# Patient Record
Sex: Male | Born: 1965 | Race: White | Hispanic: No | Marital: Single | Smoking: Current every day smoker
Health system: Southern US, Community
[De-identification: ages and names within clinical notes are randomized; demographics above are authoritative.]

## PROBLEM LIST (undated history)

## (undated) DIAGNOSIS — K469 Unspecified abdominal hernia without obstruction or gangrene: Secondary | ICD-10-CM

## (undated) DIAGNOSIS — K5000 Crohn's disease of small intestine without complications: Secondary | ICD-10-CM

## (undated) DIAGNOSIS — H5500 Unspecified nystagmus: Secondary | ICD-10-CM

## (undated) DIAGNOSIS — G4700 Insomnia, unspecified: Secondary | ICD-10-CM

## (undated) DIAGNOSIS — Z933 Colostomy status: Secondary | ICD-10-CM

## (undated) DIAGNOSIS — F319 Bipolar disorder, unspecified: Secondary | ICD-10-CM

## (undated) DIAGNOSIS — Z8672 Personal history of thrombophlebitis: Secondary | ICD-10-CM

## (undated) DIAGNOSIS — F23 Brief psychotic disorder: Secondary | ICD-10-CM

## (undated) DIAGNOSIS — Z86718 Personal history of other venous thrombosis and embolism: Secondary | ICD-10-CM

## (undated) DIAGNOSIS — K5010 Crohn's disease of large intestine without complications: Secondary | ICD-10-CM

## (undated) DIAGNOSIS — E785 Hyperlipidemia, unspecified: Secondary | ICD-10-CM

## (undated) DIAGNOSIS — F209 Schizophrenia, unspecified: Secondary | ICD-10-CM

## (undated) DIAGNOSIS — F32A Depression, unspecified: Secondary | ICD-10-CM

## (undated) DIAGNOSIS — I82409 Acute embolism and thrombosis of unspecified deep veins of unspecified lower extremity: Secondary | ICD-10-CM

## (undated) DIAGNOSIS — G43909 Migraine, unspecified, not intractable, without status migrainosus: Secondary | ICD-10-CM

## (undated) DIAGNOSIS — M1990 Unspecified osteoarthritis, unspecified site: Secondary | ICD-10-CM

## (undated) DIAGNOSIS — F329 Major depressive disorder, single episode, unspecified: Secondary | ICD-10-CM

## (undated) DIAGNOSIS — N390 Urinary tract infection, site not specified: Secondary | ICD-10-CM

## (undated) DIAGNOSIS — Z9889 Other specified postprocedural states: Secondary | ICD-10-CM

## (undated) DIAGNOSIS — M87059 Idiopathic aseptic necrosis of unspecified femur: Secondary | ICD-10-CM

## (undated) DIAGNOSIS — K219 Gastro-esophageal reflux disease without esophagitis: Secondary | ICD-10-CM

## (undated) DIAGNOSIS — B952 Enterococcus as the cause of diseases classified elsewhere: Secondary | ICD-10-CM

## (undated) HISTORY — DX: Idiopathic aseptic necrosis of unspecified femur: M87.059

## (undated) HISTORY — PX: APPENDECTOMY: SHX54

## (undated) HISTORY — DX: Personal history of other venous thrombosis and embolism: Z86.718

## (undated) HISTORY — DX: Gastro-esophageal reflux disease without esophagitis: K21.9

## (undated) HISTORY — DX: Urinary tract infection, site not specified: N39.0

## (undated) HISTORY — DX: Acute embolism and thrombosis of unspecified deep veins of unspecified lower extremity: I82.409

## (undated) HISTORY — DX: Crohn's disease of small intestine without complications: K50.00

## (undated) HISTORY — DX: Unspecified nystagmus: H55.00

## (undated) HISTORY — DX: Insomnia, unspecified: G47.00

## (undated) HISTORY — DX: Other specified postprocedural states: Z98.890

## (undated) HISTORY — PX: KIDNEY SURGERY: SHX687

## (undated) HISTORY — DX: Personal history of thrombophlebitis: Z86.72

## (undated) HISTORY — DX: Crohn's disease of large intestine without complications: K50.10

## (undated) HISTORY — PX: TOTAL HIP ARTHROPLASTY: SHX124

## (undated) HISTORY — DX: Hyperlipidemia, unspecified: E78.5

## (undated) HISTORY — DX: Enterococcus as the cause of diseases classified elsewhere: B95.2

## (undated) HISTORY — PX: EYE SURGERY: SHX253

## (undated) HISTORY — DX: Bipolar disorder, unspecified: F31.9

## (undated) HISTORY — PX: TOTAL SHOULDER REPLACEMENT: SUR1217

---

## 2002-05-14 ENCOUNTER — Ambulatory Visit (HOSPITAL_COMMUNITY): Admission: RE | Admit: 2002-05-14 | Discharge: 2002-05-14 | Payer: Self-pay | Admitting: Orthopedic Surgery

## 2002-05-14 ENCOUNTER — Encounter: Payer: Self-pay | Admitting: Orthopedic Surgery

## 2004-01-31 ENCOUNTER — Ambulatory Visit (HOSPITAL_COMMUNITY): Admission: RE | Admit: 2004-01-31 | Discharge: 2004-01-31 | Payer: Self-pay | Admitting: Internal Medicine

## 2004-02-03 ENCOUNTER — Ambulatory Visit (HOSPITAL_COMMUNITY): Admission: RE | Admit: 2004-02-03 | Discharge: 2004-02-03 | Payer: Self-pay | Admitting: Internal Medicine

## 2004-03-06 ENCOUNTER — Ambulatory Visit (HOSPITAL_COMMUNITY): Admission: RE | Admit: 2004-03-06 | Discharge: 2004-03-06 | Payer: Self-pay | Admitting: Internal Medicine

## 2004-03-09 ENCOUNTER — Inpatient Hospital Stay (HOSPITAL_COMMUNITY): Admission: AD | Admit: 2004-03-09 | Discharge: 2004-03-12 | Payer: Self-pay | Admitting: Family Medicine

## 2004-07-23 ENCOUNTER — Ambulatory Visit (HOSPITAL_COMMUNITY): Admission: RE | Admit: 2004-07-23 | Discharge: 2004-07-23 | Payer: Self-pay | Admitting: General Surgery

## 2004-09-14 ENCOUNTER — Ambulatory Visit: Payer: Self-pay | Admitting: Internal Medicine

## 2004-09-23 ENCOUNTER — Ambulatory Visit (HOSPITAL_COMMUNITY): Admission: RE | Admit: 2004-09-23 | Discharge: 2004-09-23 | Payer: Self-pay | Admitting: Internal Medicine

## 2004-10-16 ENCOUNTER — Ambulatory Visit: Payer: Self-pay | Admitting: Internal Medicine

## 2004-11-06 ENCOUNTER — Ambulatory Visit: Payer: Self-pay | Admitting: Internal Medicine

## 2004-12-30 ENCOUNTER — Ambulatory Visit: Payer: Self-pay | Admitting: Internal Medicine

## 2005-04-01 ENCOUNTER — Ambulatory Visit: Payer: Self-pay | Admitting: Internal Medicine

## 2005-04-13 ENCOUNTER — Ambulatory Visit (HOSPITAL_COMMUNITY): Admission: RE | Admit: 2005-04-13 | Discharge: 2005-04-13 | Payer: Self-pay | Admitting: Internal Medicine

## 2005-06-29 ENCOUNTER — Ambulatory Visit: Payer: Self-pay | Admitting: Internal Medicine

## 2005-12-30 ENCOUNTER — Ambulatory Visit: Payer: Self-pay | Admitting: Internal Medicine

## 2006-04-26 ENCOUNTER — Ambulatory Visit: Payer: Self-pay | Admitting: Internal Medicine

## 2006-05-26 ENCOUNTER — Ambulatory Visit: Payer: Self-pay | Admitting: Internal Medicine

## 2006-07-26 ENCOUNTER — Ambulatory Visit: Payer: Self-pay | Admitting: Internal Medicine

## 2006-08-17 ENCOUNTER — Encounter: Payer: Self-pay | Admitting: Orthopedic Surgery

## 2006-09-03 ENCOUNTER — Encounter: Payer: Self-pay | Admitting: Orthopedic Surgery

## 2006-09-16 ENCOUNTER — Ambulatory Visit: Payer: Self-pay | Admitting: Internal Medicine

## 2006-10-26 ENCOUNTER — Ambulatory Visit: Payer: Self-pay | Admitting: Internal Medicine

## 2006-11-23 ENCOUNTER — Ambulatory Visit: Payer: Self-pay | Admitting: Internal Medicine

## 2007-01-17 ENCOUNTER — Ambulatory Visit: Payer: Self-pay | Admitting: Internal Medicine

## 2007-07-04 ENCOUNTER — Ambulatory Visit: Payer: Self-pay | Admitting: Gastroenterology

## 2007-07-04 ENCOUNTER — Encounter (HOSPITAL_COMMUNITY): Admission: RE | Admit: 2007-07-04 | Discharge: 2007-07-04 | Payer: Self-pay | Admitting: Oncology

## 2007-07-05 ENCOUNTER — Encounter (HOSPITAL_COMMUNITY): Admission: RE | Admit: 2007-07-05 | Discharge: 2007-08-04 | Payer: Self-pay | Admitting: Gastroenterology

## 2007-07-06 ENCOUNTER — Ambulatory Visit (HOSPITAL_COMMUNITY): Payer: Self-pay | Admitting: Gastroenterology

## 2007-07-10 ENCOUNTER — Ambulatory Visit: Payer: Self-pay | Admitting: Gastroenterology

## 2007-07-10 ENCOUNTER — Ambulatory Visit (HOSPITAL_COMMUNITY): Admission: RE | Admit: 2007-07-10 | Discharge: 2007-07-10 | Payer: Self-pay | Admitting: Gastroenterology

## 2007-07-10 ENCOUNTER — Encounter: Payer: Self-pay | Admitting: Gastroenterology

## 2007-07-25 ENCOUNTER — Ambulatory Visit: Payer: Self-pay | Admitting: Gastroenterology

## 2007-08-29 ENCOUNTER — Ambulatory Visit: Payer: Self-pay | Admitting: Gastroenterology

## 2007-09-14 ENCOUNTER — Inpatient Hospital Stay (HOSPITAL_COMMUNITY): Admission: EM | Admit: 2007-09-14 | Discharge: 2007-09-18 | Payer: Self-pay | Admitting: Emergency Medicine

## 2007-09-15 ENCOUNTER — Ambulatory Visit: Payer: Self-pay | Admitting: Internal Medicine

## 2007-09-16 ENCOUNTER — Ambulatory Visit: Payer: Self-pay | Admitting: Gastroenterology

## 2007-09-18 ENCOUNTER — Ambulatory Visit: Payer: Self-pay | Admitting: Internal Medicine

## 2007-10-05 DIAGNOSIS — I82409 Acute embolism and thrombosis of unspecified deep veins of unspecified lower extremity: Secondary | ICD-10-CM

## 2007-10-05 DIAGNOSIS — N390 Urinary tract infection, site not specified: Secondary | ICD-10-CM

## 2007-10-05 DIAGNOSIS — B952 Enterococcus as the cause of diseases classified elsewhere: Secondary | ICD-10-CM

## 2007-10-05 HISTORY — DX: Urinary tract infection, site not specified: N39.0

## 2007-10-05 HISTORY — PX: TOTAL COLECTOMY: SHX852

## 2007-10-05 HISTORY — DX: Enterococcus as the cause of diseases classified elsewhere: B95.2

## 2007-10-05 HISTORY — DX: Acute embolism and thrombosis of unspecified deep veins of unspecified lower extremity: I82.409

## 2007-10-10 ENCOUNTER — Ambulatory Visit: Payer: Self-pay | Admitting: Gastroenterology

## 2007-11-14 ENCOUNTER — Ambulatory Visit: Payer: Self-pay | Admitting: Gastroenterology

## 2007-11-15 ENCOUNTER — Encounter (HOSPITAL_COMMUNITY): Admission: RE | Admit: 2007-11-15 | Discharge: 2007-12-15 | Payer: Self-pay | Admitting: Gastroenterology

## 2007-11-15 ENCOUNTER — Ambulatory Visit (HOSPITAL_COMMUNITY): Payer: Self-pay | Admitting: Gastroenterology

## 2007-11-20 ENCOUNTER — Ambulatory Visit (HOSPITAL_COMMUNITY): Payer: Self-pay | Admitting: Oncology

## 2007-11-24 ENCOUNTER — Inpatient Hospital Stay (HOSPITAL_COMMUNITY): Admission: EM | Admit: 2007-11-24 | Discharge: 2007-11-28 | Payer: Self-pay | Admitting: Emergency Medicine

## 2007-11-24 ENCOUNTER — Ambulatory Visit: Payer: Self-pay | Admitting: Gastroenterology

## 2007-11-27 ENCOUNTER — Ambulatory Visit: Payer: Self-pay | Admitting: Internal Medicine

## 2007-12-18 ENCOUNTER — Encounter (HOSPITAL_COMMUNITY): Admission: RE | Admit: 2007-12-18 | Discharge: 2008-01-17 | Payer: Self-pay | Admitting: Gastroenterology

## 2007-12-26 ENCOUNTER — Ambulatory Visit: Payer: Self-pay | Admitting: Gastroenterology

## 2008-01-11 ENCOUNTER — Ambulatory Visit: Payer: Self-pay | Admitting: Gastroenterology

## 2008-01-22 ENCOUNTER — Inpatient Hospital Stay (HOSPITAL_COMMUNITY): Admission: EM | Admit: 2008-01-22 | Discharge: 2008-01-25 | Payer: Self-pay | Admitting: Emergency Medicine

## 2008-01-22 ENCOUNTER — Ambulatory Visit: Payer: Self-pay | Admitting: Gastroenterology

## 2008-01-23 ENCOUNTER — Ambulatory Visit: Payer: Self-pay | Admitting: Internal Medicine

## 2008-01-24 ENCOUNTER — Ambulatory Visit: Payer: Self-pay | Admitting: Gastroenterology

## 2008-01-25 ENCOUNTER — Ambulatory Visit: Payer: Self-pay | Admitting: Gastroenterology

## 2008-01-31 ENCOUNTER — Inpatient Hospital Stay (HOSPITAL_COMMUNITY): Admission: EM | Admit: 2008-01-31 | Discharge: 2008-02-11 | Payer: Self-pay | Admitting: Emergency Medicine

## 2008-02-01 ENCOUNTER — Ambulatory Visit: Payer: Self-pay | Admitting: Internal Medicine

## 2008-02-02 ENCOUNTER — Ambulatory Visit: Payer: Self-pay | Admitting: Oncology

## 2008-02-02 ENCOUNTER — Ambulatory Visit: Payer: Self-pay | Admitting: Internal Medicine

## 2008-02-06 ENCOUNTER — Ambulatory Visit: Payer: Self-pay | Admitting: Gastroenterology

## 2008-02-19 ENCOUNTER — Ambulatory Visit: Payer: Self-pay | Admitting: Gastroenterology

## 2008-03-04 ENCOUNTER — Inpatient Hospital Stay (HOSPITAL_COMMUNITY): Admission: EM | Admit: 2008-03-04 | Discharge: 2008-03-07 | Payer: Self-pay | Admitting: Emergency Medicine

## 2008-03-16 ENCOUNTER — Emergency Department (HOSPITAL_COMMUNITY): Admission: EM | Admit: 2008-03-16 | Discharge: 2008-03-17 | Payer: Self-pay | Admitting: Emergency Medicine

## 2008-03-17 ENCOUNTER — Inpatient Hospital Stay (HOSPITAL_COMMUNITY): Admission: EM | Admit: 2008-03-17 | Discharge: 2008-03-17 | Payer: Self-pay | Admitting: Emergency Medicine

## 2008-03-17 ENCOUNTER — Ambulatory Visit: Payer: Self-pay | Admitting: Gastroenterology

## 2008-04-05 ENCOUNTER — Inpatient Hospital Stay (HOSPITAL_COMMUNITY): Admission: EM | Admit: 2008-04-05 | Discharge: 2008-04-07 | Payer: Self-pay | Admitting: Emergency Medicine

## 2008-04-06 ENCOUNTER — Ambulatory Visit: Payer: Self-pay | Admitting: Gastroenterology

## 2008-05-14 ENCOUNTER — Emergency Department (HOSPITAL_COMMUNITY): Admission: EM | Admit: 2008-05-14 | Discharge: 2008-05-14 | Payer: Self-pay | Admitting: Emergency Medicine

## 2008-06-13 ENCOUNTER — Ambulatory Visit: Payer: Self-pay | Admitting: Gastroenterology

## 2008-09-24 ENCOUNTER — Encounter: Payer: Self-pay | Admitting: Orthopedic Surgery

## 2008-10-04 ENCOUNTER — Encounter: Payer: Self-pay | Admitting: Orthopedic Surgery

## 2008-11-04 ENCOUNTER — Encounter: Payer: Self-pay | Admitting: Orthopedic Surgery

## 2008-11-07 ENCOUNTER — Ambulatory Visit: Payer: Self-pay | Admitting: Gastroenterology

## 2008-11-19 ENCOUNTER — Ambulatory Visit (HOSPITAL_COMMUNITY): Admission: RE | Admit: 2008-11-19 | Discharge: 2008-11-19 | Payer: Self-pay | Admitting: Gastroenterology

## 2009-01-07 ENCOUNTER — Encounter (INDEPENDENT_AMBULATORY_CARE_PROVIDER_SITE_OTHER): Payer: Self-pay

## 2009-01-07 ENCOUNTER — Telehealth: Payer: Self-pay | Admitting: Gastroenterology

## 2009-02-11 DIAGNOSIS — J45909 Unspecified asthma, uncomplicated: Secondary | ICD-10-CM | POA: Insufficient documentation

## 2009-02-11 DIAGNOSIS — Z8672 Personal history of thrombophlebitis: Secondary | ICD-10-CM

## 2009-02-11 DIAGNOSIS — I951 Orthostatic hypotension: Secondary | ICD-10-CM | POA: Insufficient documentation

## 2009-02-11 DIAGNOSIS — K219 Gastro-esophageal reflux disease without esophagitis: Secondary | ICD-10-CM

## 2009-02-11 DIAGNOSIS — G4700 Insomnia, unspecified: Secondary | ICD-10-CM

## 2009-02-11 DIAGNOSIS — E785 Hyperlipidemia, unspecified: Secondary | ICD-10-CM | POA: Insufficient documentation

## 2009-02-11 DIAGNOSIS — M129 Arthropathy, unspecified: Secondary | ICD-10-CM | POA: Insufficient documentation

## 2009-02-11 DIAGNOSIS — M8700 Idiopathic aseptic necrosis of unspecified bone: Secondary | ICD-10-CM

## 2009-02-11 DIAGNOSIS — Z86718 Personal history of other venous thrombosis and embolism: Secondary | ICD-10-CM

## 2009-02-11 DIAGNOSIS — H5500 Unspecified nystagmus: Secondary | ICD-10-CM | POA: Insufficient documentation

## 2009-02-11 DIAGNOSIS — F319 Bipolar disorder, unspecified: Secondary | ICD-10-CM | POA: Insufficient documentation

## 2009-02-11 HISTORY — DX: Personal history of other venous thrombosis and embolism: Z86.718

## 2009-02-11 HISTORY — DX: Personal history of thrombophlebitis: Z86.72

## 2009-02-12 ENCOUNTER — Ambulatory Visit: Payer: Self-pay | Admitting: Gastroenterology

## 2009-02-12 DIAGNOSIS — K5010 Crohn's disease of large intestine without complications: Secondary | ICD-10-CM | POA: Insufficient documentation

## 2009-03-13 IMAGING — CT CT CERVICAL SPINE W/O CM
2 of 3 series · 8 of 14 positions shown, 9 images · non-contrast
Comparison: None available

CT HEAD

CLINICAL DATA: Fall.

CT HEAD WITHOUT CONTRAST
CT CERVICAL SPINE WITHOUT CONTRAST
TECHNIQUE: Multidetector CT imaging of the head and cervical spine
was performed following the standard protocol without IV contrast.
Multiplanar CT image reconstructions of the cervical spine were
also generated.

[Series 4: cervical 2.0 b31s · axial · 0.35mm/px · z∈[-756,-636]mm · 4 of 134 slices shown, 5 images]
[im 27/134  soft-tissue]
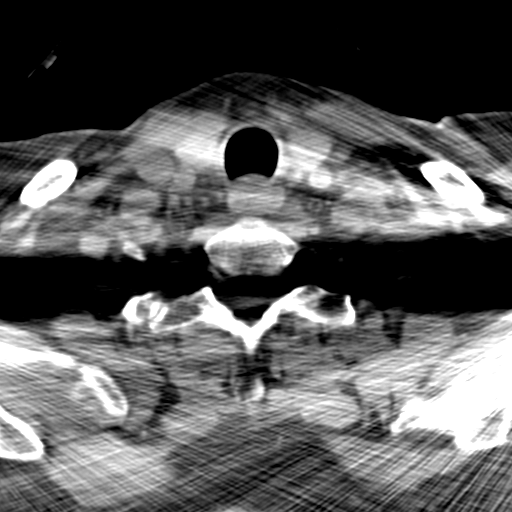
[im 27/134  bone]
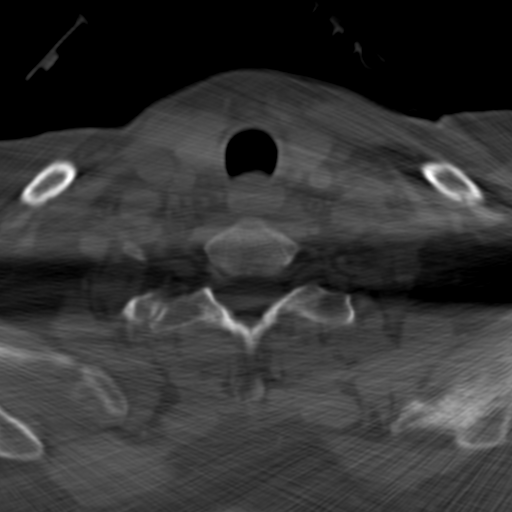
[im 54/134  bone]
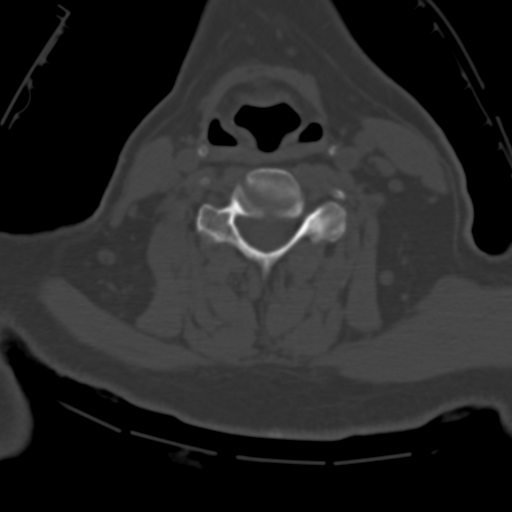
[im 80/134  bone]
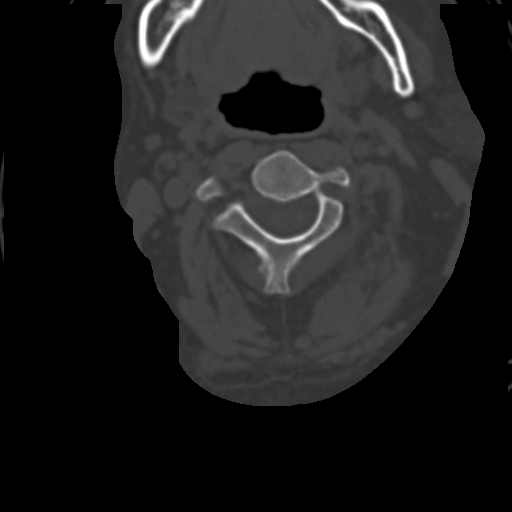
[im 107/134  bone]
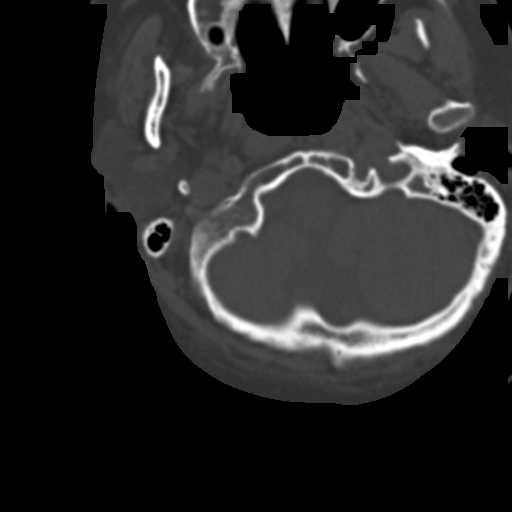

[Series 8: cervical 2.0 spo · axial · 0.24mm/px · z∈[-764,-639]mm · 4 of 140 slices shown]
[im 28/140  bone]
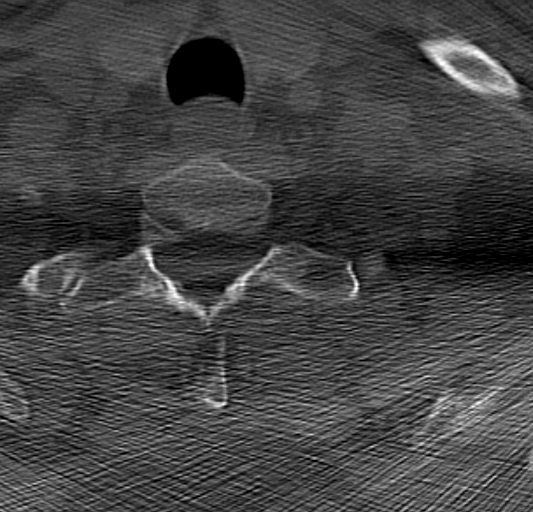
[im 56/140  bone]
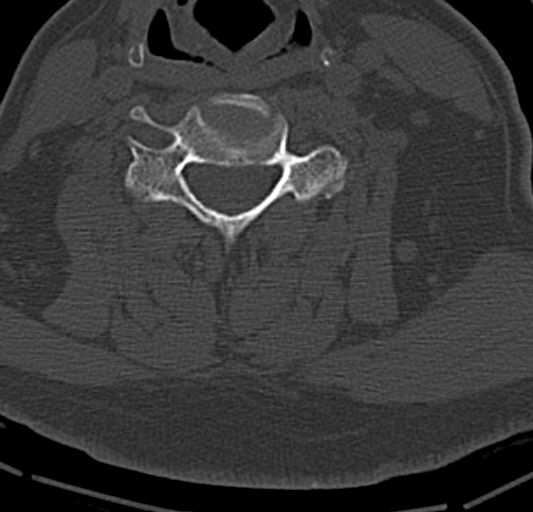
[im 84/140  bone]
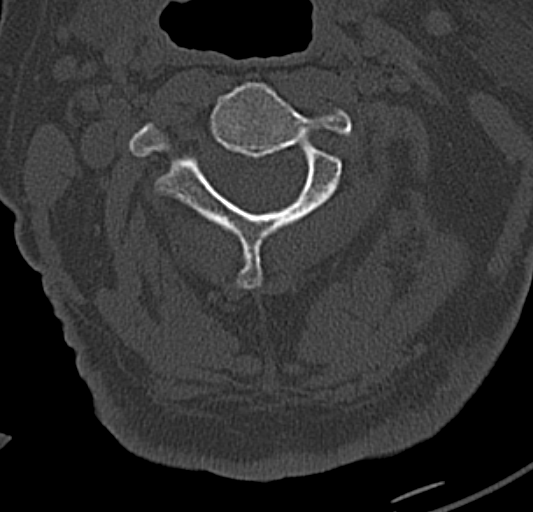
[im 112/140  bone]
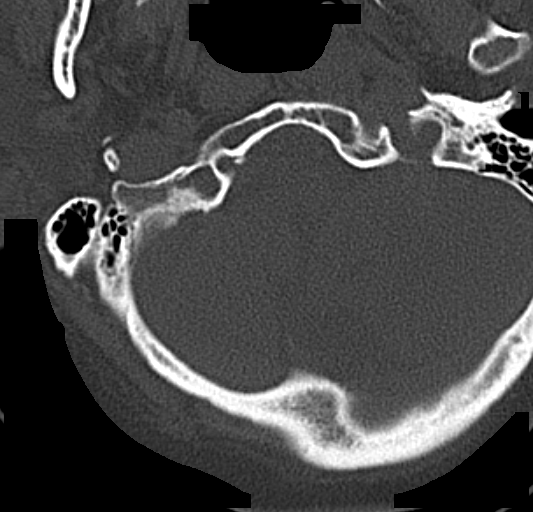

[8 of 14 positions shown; findings below may reference images not displayed]

FINDINGS: There is no evidence of acute intracranial hemorrhage,
brain edema, mass,  mass effect, or midline shift. Acute infarct
may be inapparent on noncontrast CT.  No other intra-axial
abnormalities are seen, and the ventricles and sulci are within
normal limits in size and symmetry.   No abnormal extra-axial fluid
collections or masses are identified.  No significant calvarial
abnormality.
IMPRESSION: 1.  Negative for bleed or other acute intracranial process.

CT CERVICAL SPINE
FINDINGS: Normal alignment.  No prevertebral soft tissue swelling.
Negative for fracture.  Visualized lung apices clear.  No
significant degenerative changes.  Streak artifact from the humeral
head prostheses results in degradation of some of the images.
Regional soft tissues unremarkable.
IMPRESSION: 1.  Negative for fracture or other acute abnormality

## 2009-03-13 IMAGING — CR DG THORACIC SPINE 2V
3 series · 3 of 3 positions shown · non-contrast
Comparison: CT 01/31/2008

CLINICAL DATA: Fell

THORACIC SPINE - 2 VIEW

[view not recorded (1 of 3)]
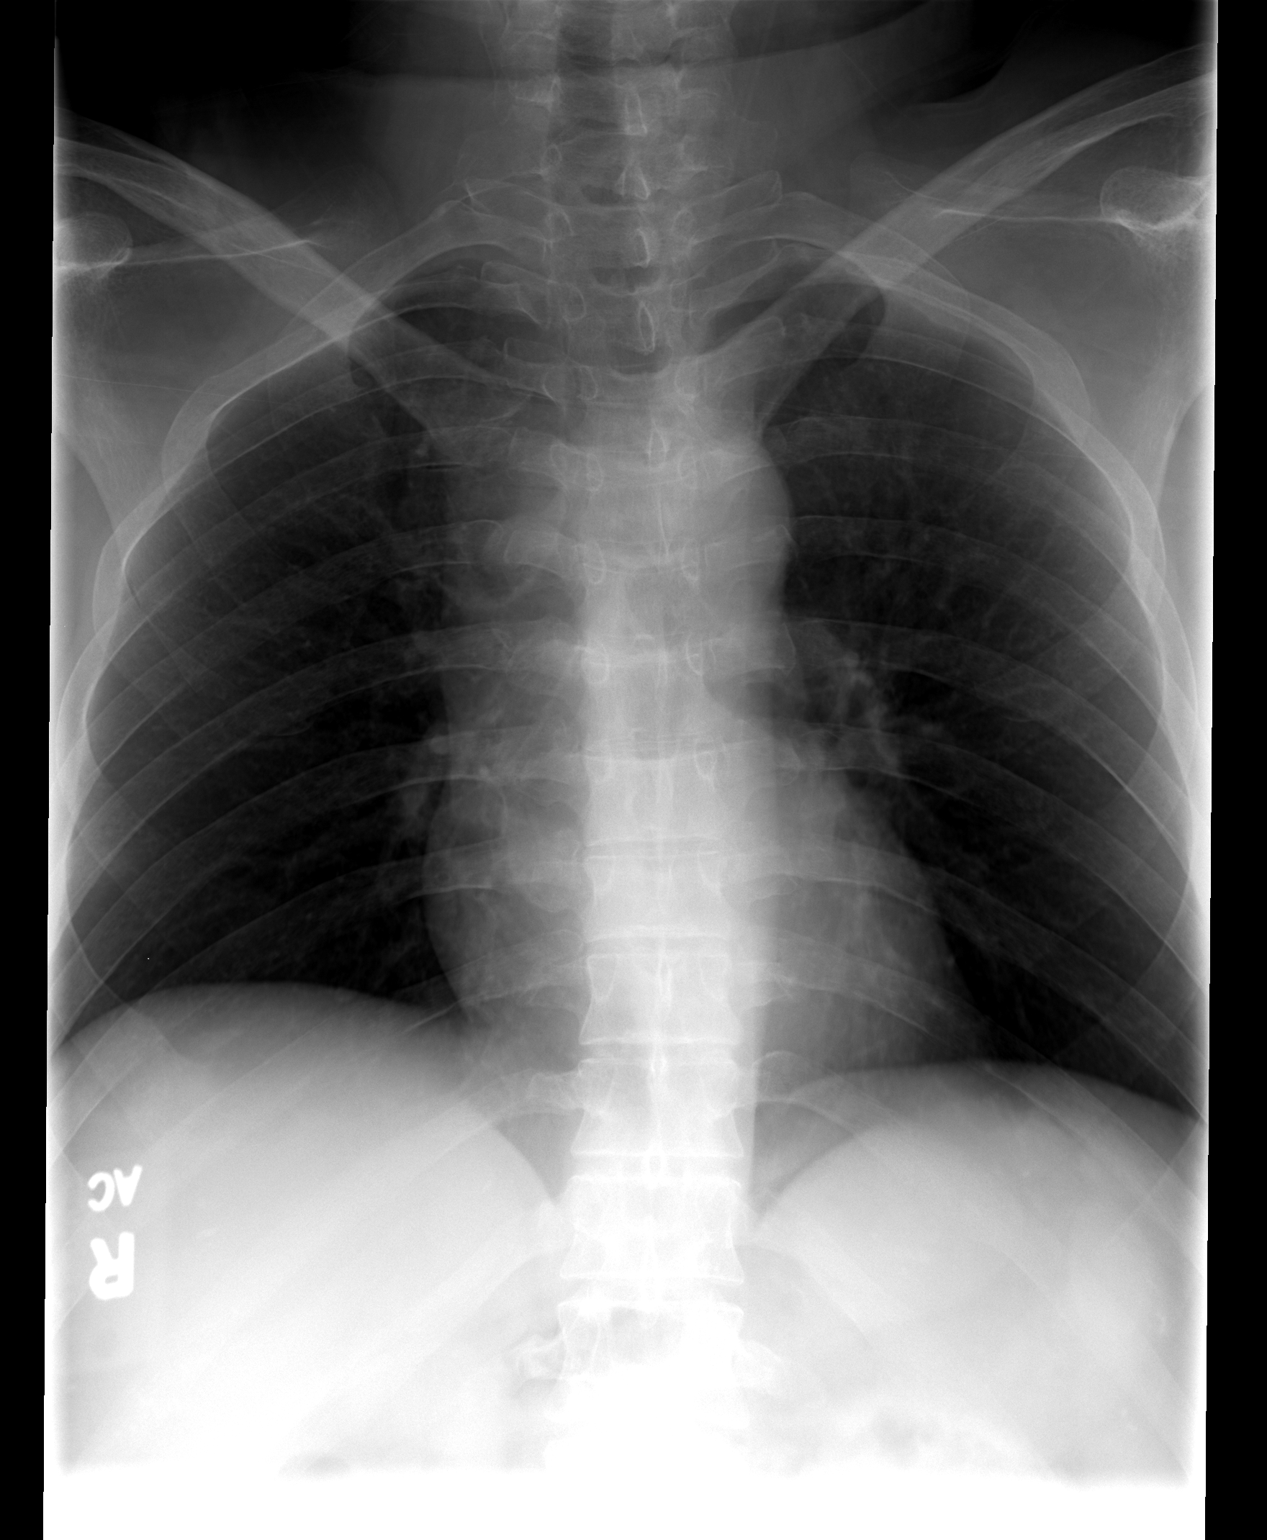

[view not recorded (2 of 3)]
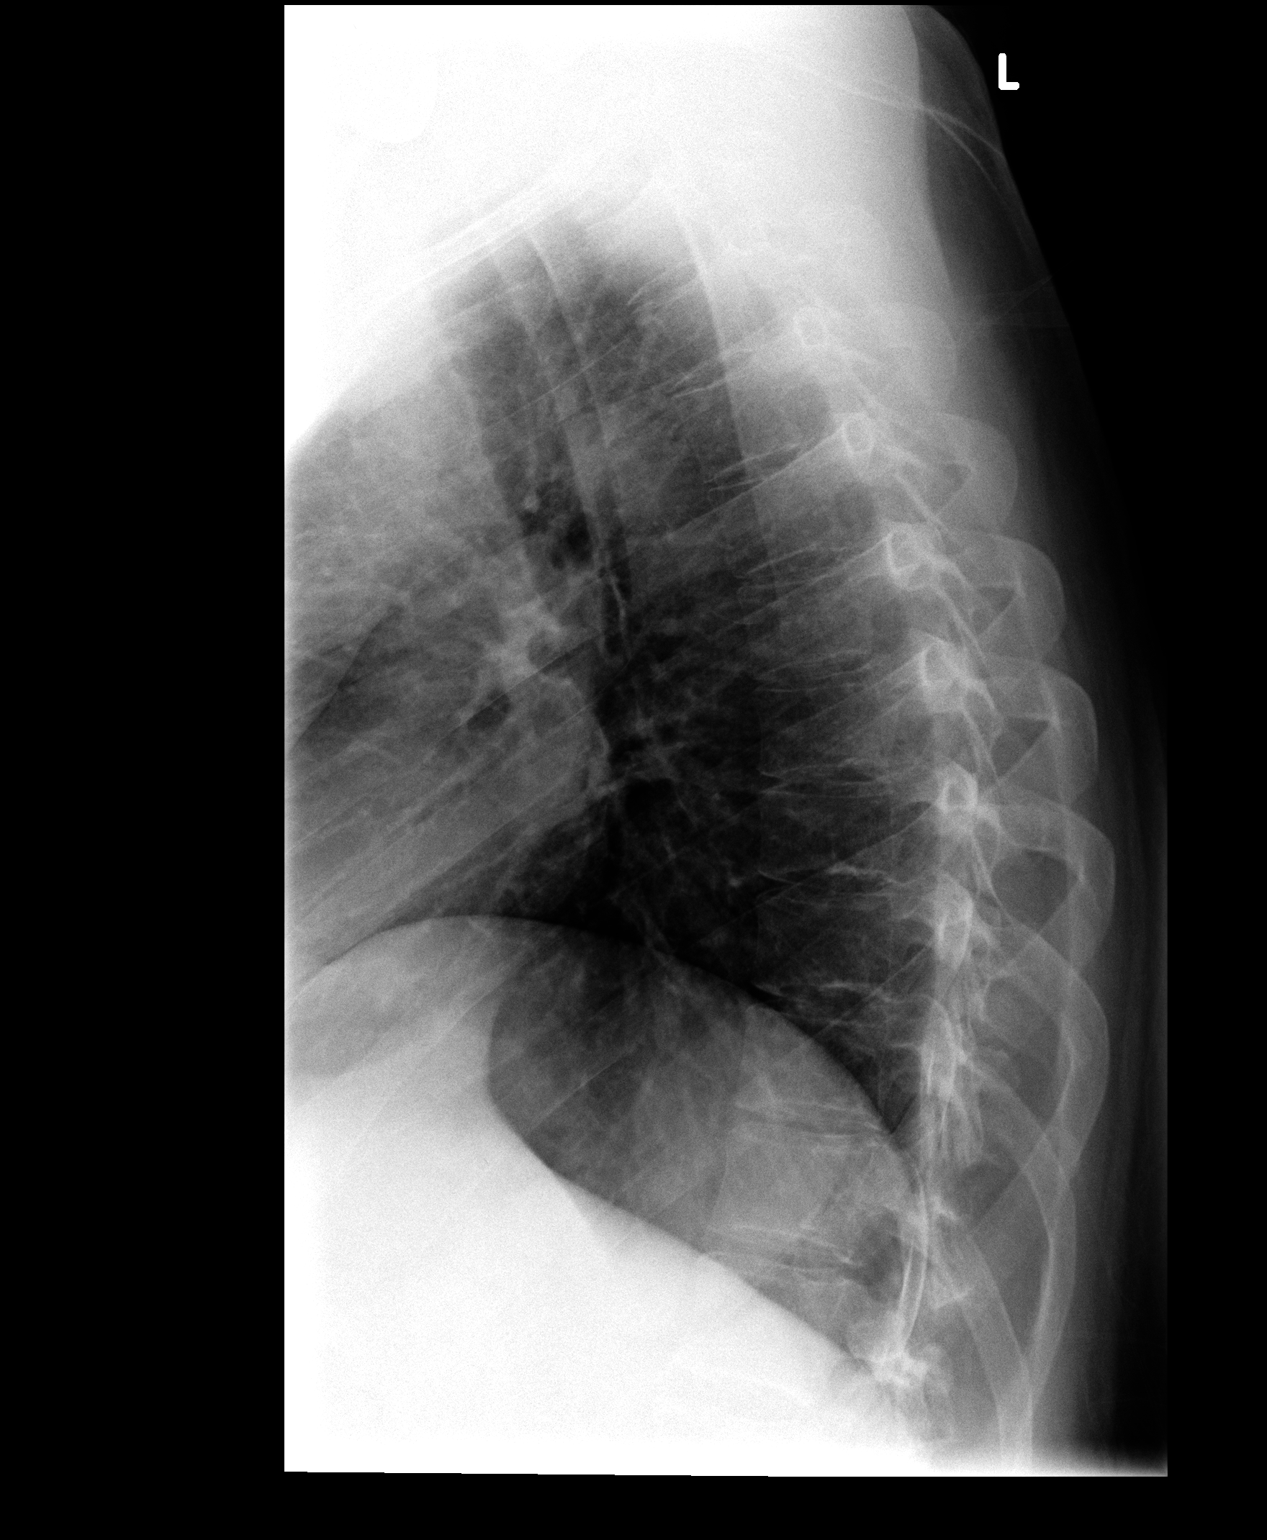

[view not recorded (3 of 3)]
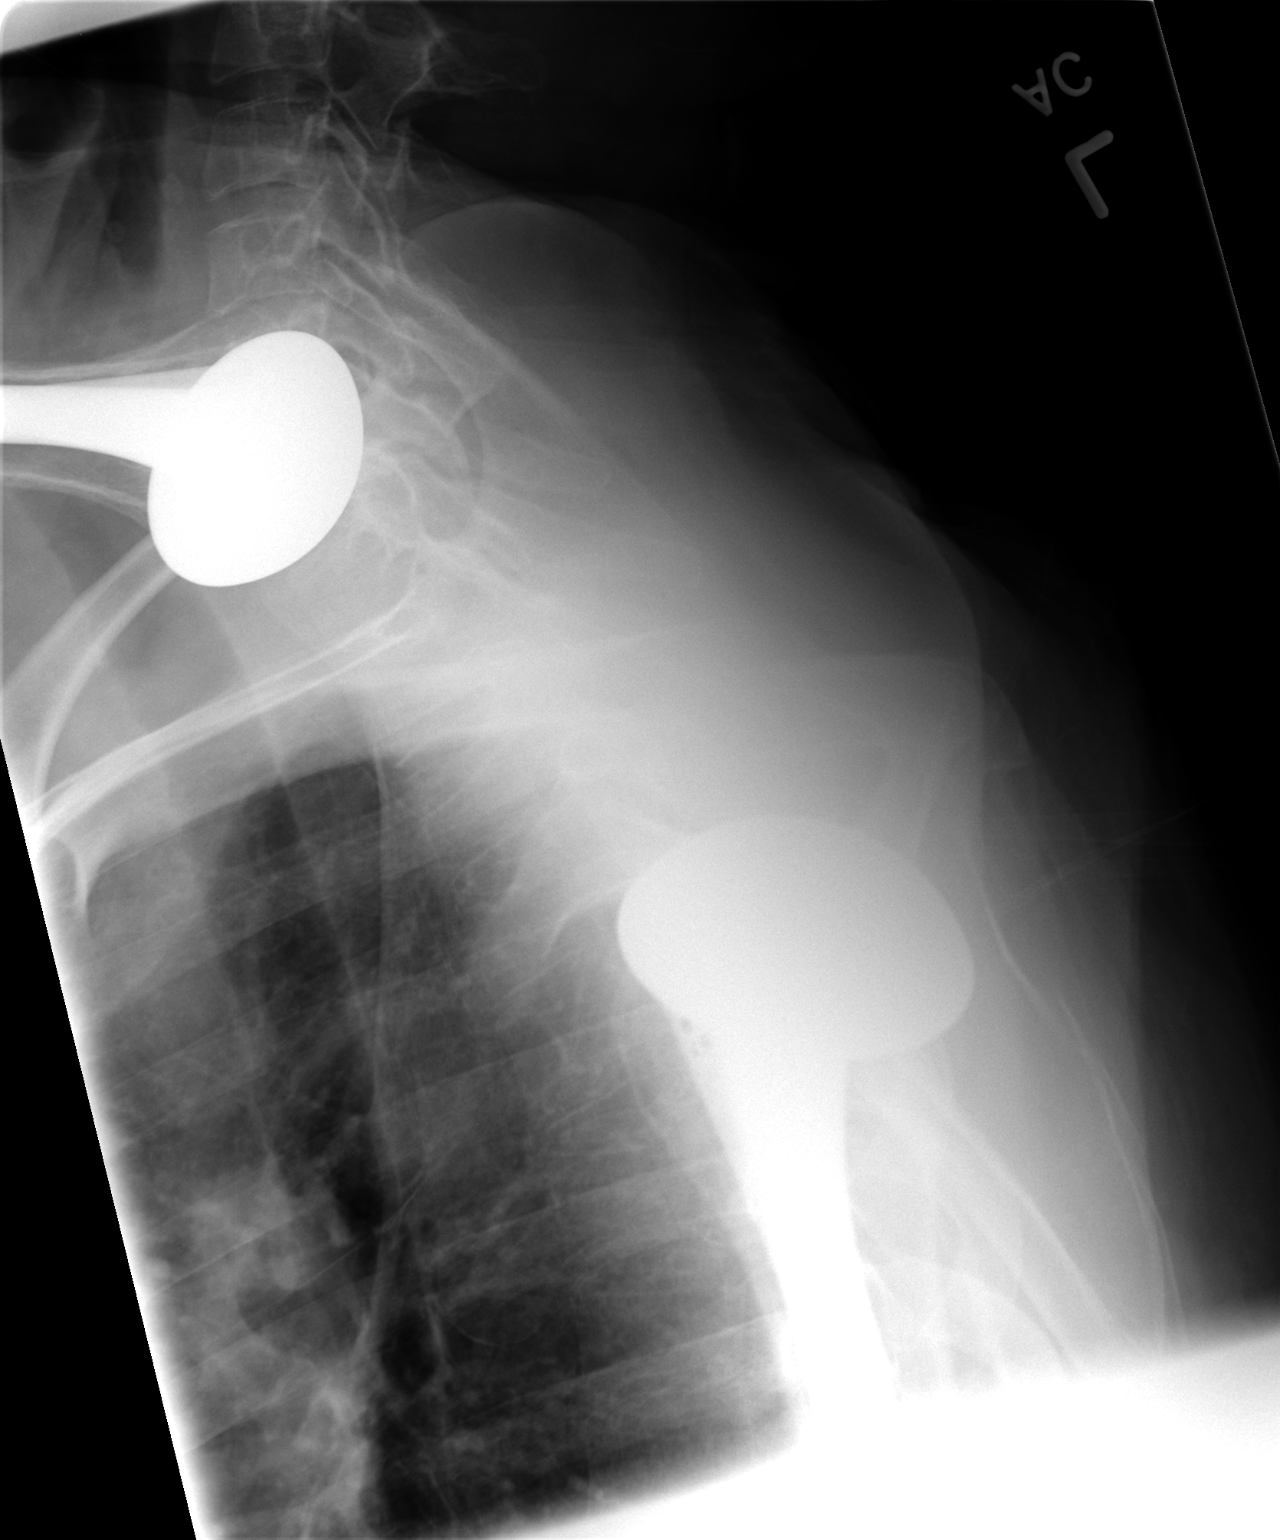

[3 of 3 positions shown; findings below may reference images not displayed]

FINDINGS: Bilateral humeral head prostheses are noted, incompletely
visualized.  There is no evidence of thoracic spine fracture.
Alignment is normal.  No other significant bone abnormalities are
identified.
IMPRESSION: Negative.

## 2009-03-14 IMAGING — CR DG CHEST 2V
2 series · 2 of 2 positions shown · non-contrast
Comparison: 03/16/2008

CLINICAL DATA: Asthma and rectal bleeding

CHEST - 2 VIEW

[view not recorded (1 of 2)]
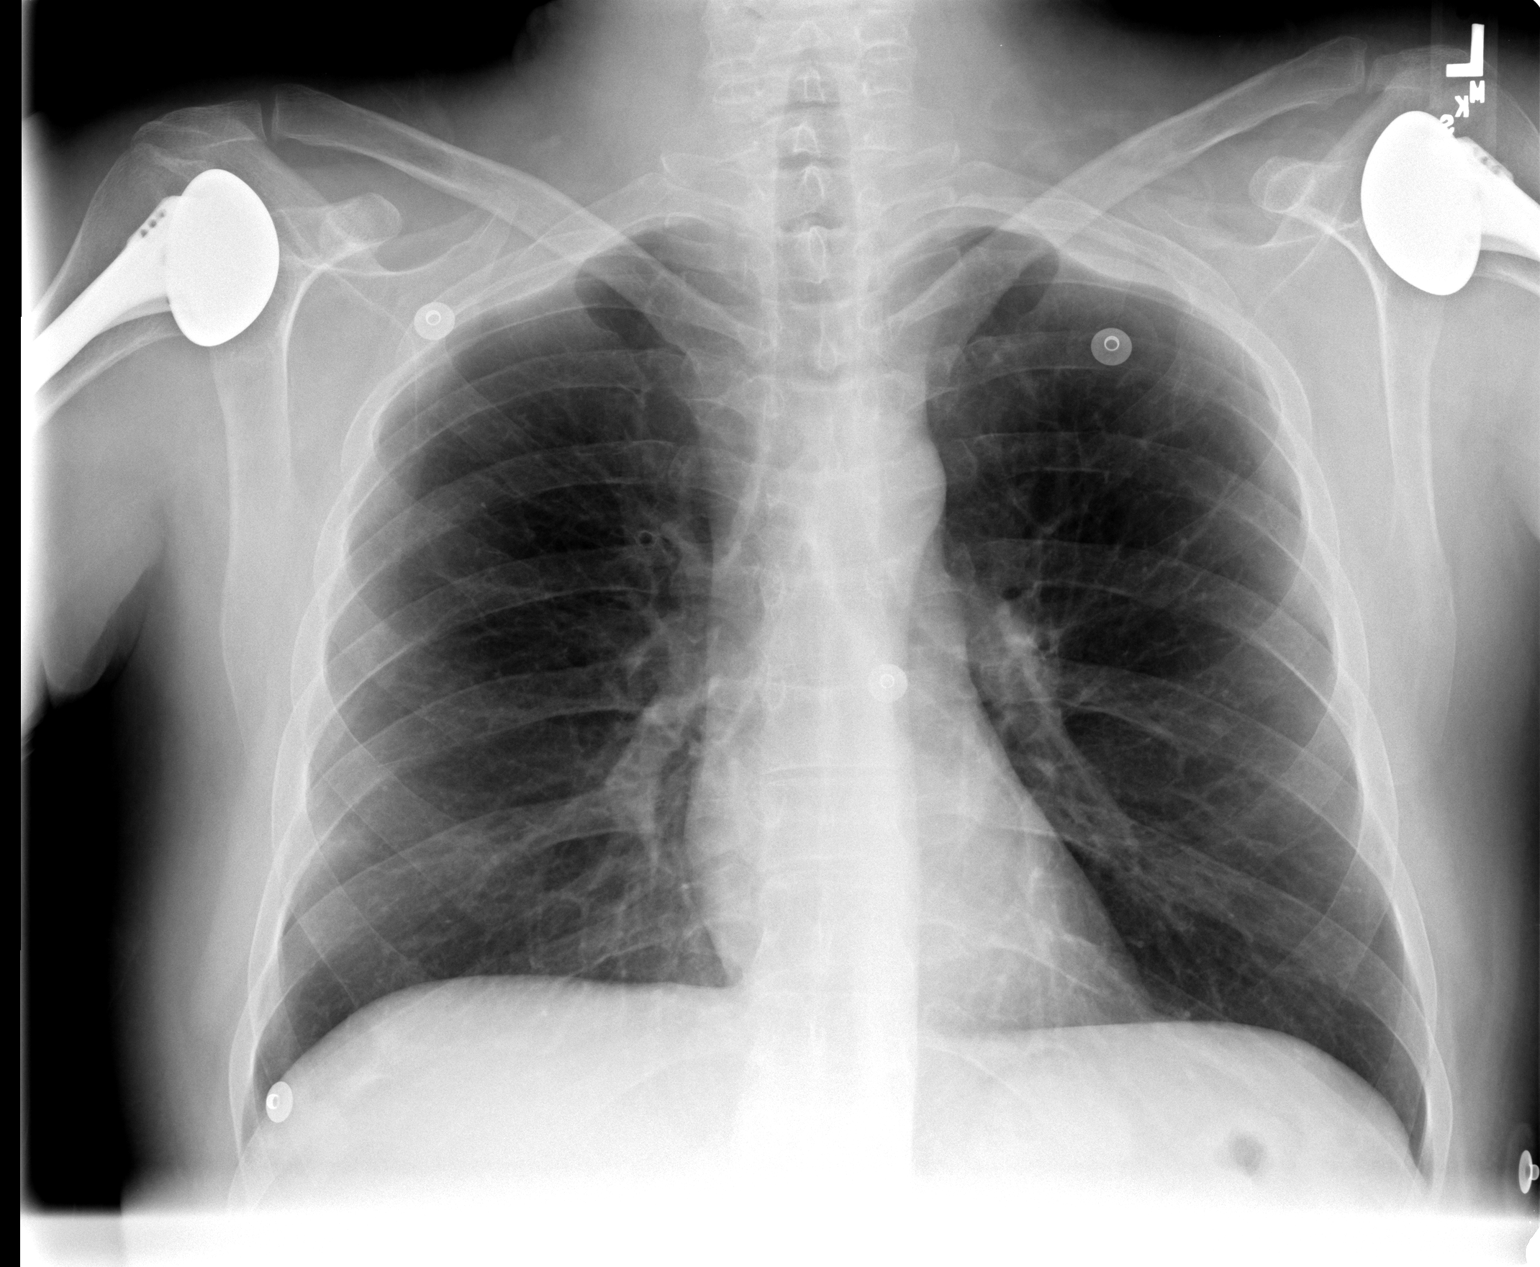

[view not recorded (2 of 2)]
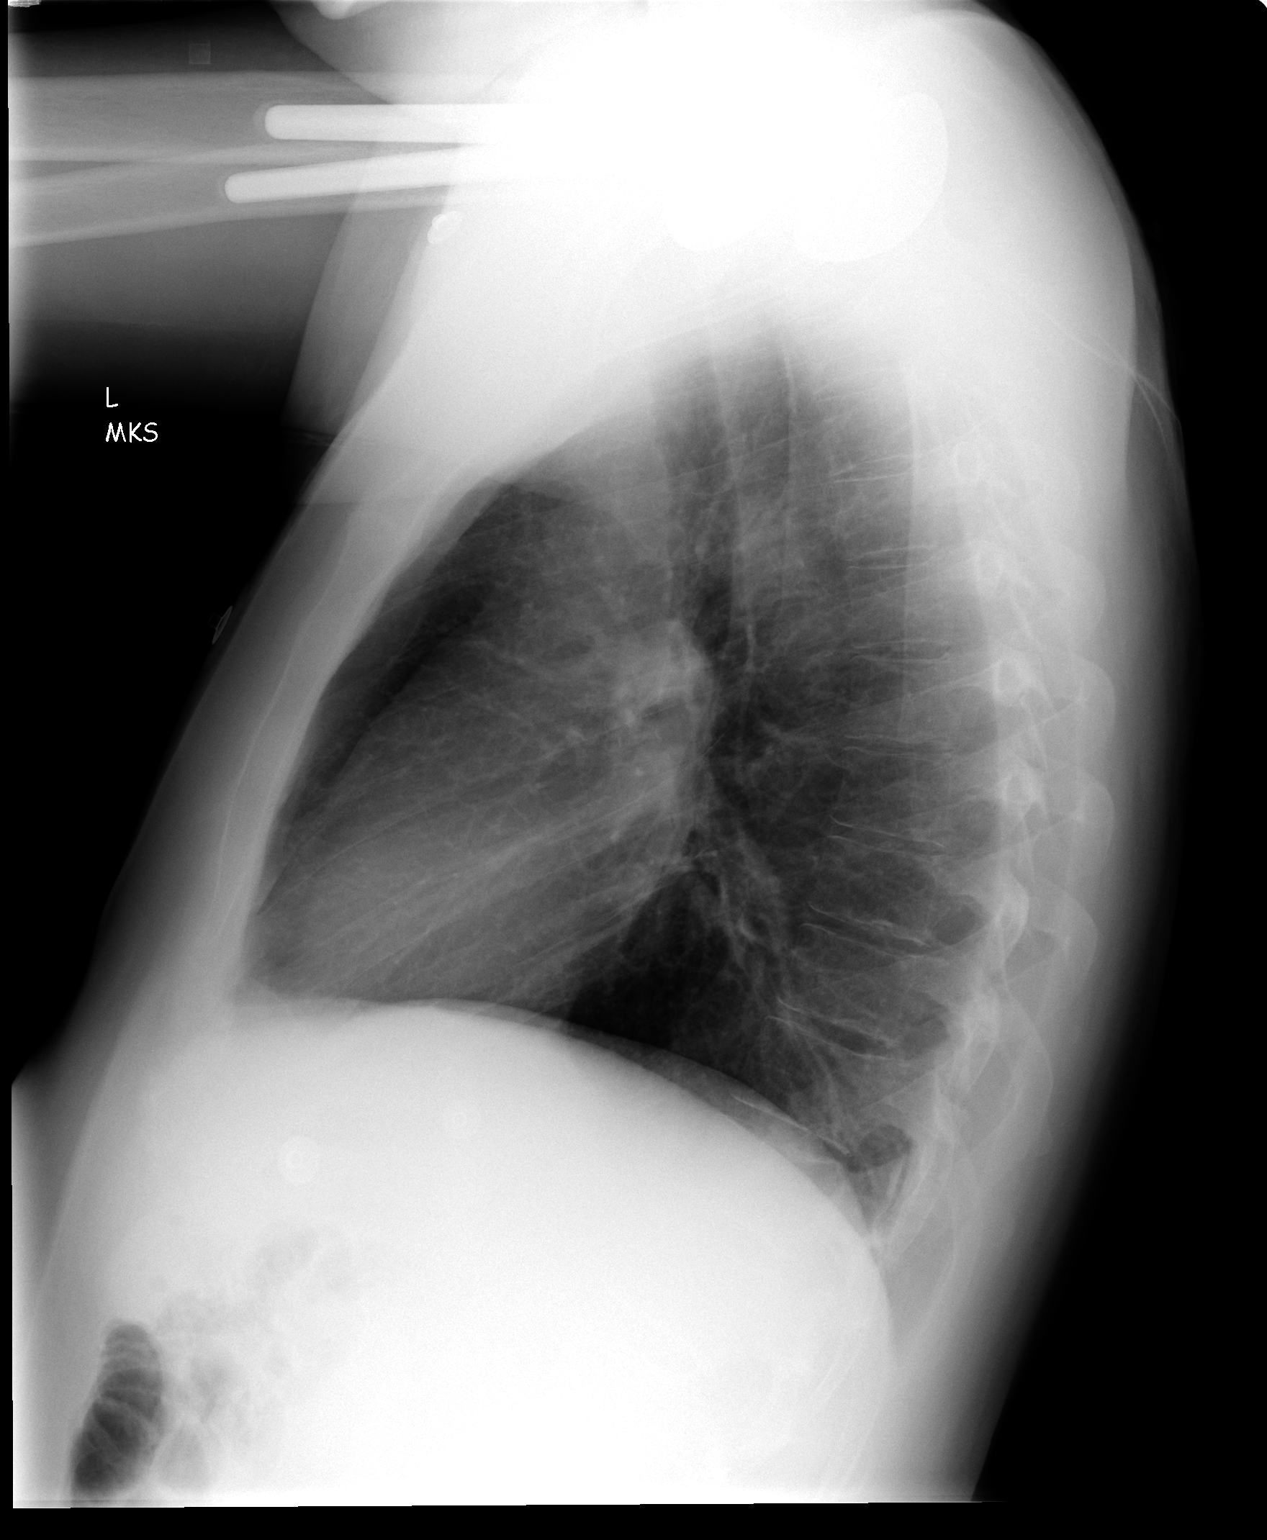

[2 of 2 positions shown; findings below may reference images not displayed]

FINDINGS: Lungs are clear.  Bronchitic changes are stable.  The
heart is normal in size.  No pneumothorax.
IMPRESSION: No active disease.  Stable bronchitic changes.

## 2009-05-27 ENCOUNTER — Encounter: Payer: Self-pay | Admitting: Family Medicine

## 2009-06-04 ENCOUNTER — Encounter: Payer: Self-pay | Admitting: Family Medicine

## 2009-07-04 ENCOUNTER — Encounter: Payer: Self-pay | Admitting: Family Medicine

## 2009-07-16 ENCOUNTER — Ambulatory Visit: Payer: Self-pay | Admitting: Gastroenterology

## 2009-08-26 ENCOUNTER — Encounter: Payer: Self-pay | Admitting: Family Medicine

## 2009-09-03 ENCOUNTER — Encounter: Payer: Self-pay | Admitting: Family Medicine

## 2009-09-30 ENCOUNTER — Encounter: Payer: Self-pay | Admitting: Gastroenterology

## 2009-10-04 ENCOUNTER — Encounter: Payer: Self-pay | Admitting: Family Medicine

## 2009-10-08 ENCOUNTER — Encounter: Payer: Self-pay | Admitting: Gastroenterology

## 2010-01-01 ENCOUNTER — Encounter (INDEPENDENT_AMBULATORY_CARE_PROVIDER_SITE_OTHER): Payer: Self-pay | Admitting: *Deleted

## 2010-01-09 ENCOUNTER — Ambulatory Visit: Payer: Self-pay | Admitting: Gastroenterology

## 2010-01-28 ENCOUNTER — Encounter (INDEPENDENT_AMBULATORY_CARE_PROVIDER_SITE_OTHER): Payer: Self-pay

## 2010-03-04 ENCOUNTER — Encounter: Payer: Self-pay | Admitting: Gastroenterology

## 2010-03-30 ENCOUNTER — Telehealth (INDEPENDENT_AMBULATORY_CARE_PROVIDER_SITE_OTHER): Payer: Self-pay

## 2010-03-31 ENCOUNTER — Ambulatory Visit (HOSPITAL_COMMUNITY): Admission: RE | Admit: 2010-03-31 | Discharge: 2010-03-31 | Payer: Self-pay | Admitting: Family Medicine

## 2010-03-31 ENCOUNTER — Ambulatory Visit: Payer: Self-pay | Admitting: Urgent Care

## 2010-04-01 ENCOUNTER — Encounter: Payer: Self-pay | Admitting: Gastroenterology

## 2010-04-20 ENCOUNTER — Ambulatory Visit: Payer: Self-pay | Admitting: Orthopedic Surgery

## 2010-05-18 ENCOUNTER — Ambulatory Visit: Payer: Self-pay | Admitting: Orthopedic Surgery

## 2010-06-11 ENCOUNTER — Encounter: Payer: Self-pay | Admitting: Gastroenterology

## 2010-06-23 ENCOUNTER — Ambulatory Visit: Payer: Self-pay | Admitting: Orthopedic Surgery

## 2010-07-04 HISTORY — PX: ESOPHAGOGASTRODUODENOSCOPY: SHX1529

## 2010-07-13 ENCOUNTER — Telehealth (INDEPENDENT_AMBULATORY_CARE_PROVIDER_SITE_OTHER): Payer: Self-pay

## 2010-07-15 ENCOUNTER — Ambulatory Visit: Payer: Self-pay | Admitting: Gastroenterology

## 2010-07-15 DIAGNOSIS — K921 Melena: Secondary | ICD-10-CM | POA: Insufficient documentation

## 2010-07-29 ENCOUNTER — Ambulatory Visit (HOSPITAL_COMMUNITY): Admission: RE | Admit: 2010-07-29 | Discharge: 2010-07-29 | Payer: Self-pay | Admitting: Gastroenterology

## 2010-07-29 ENCOUNTER — Ambulatory Visit: Payer: Self-pay | Admitting: Gastroenterology

## 2010-07-30 ENCOUNTER — Encounter: Payer: Self-pay | Admitting: Gastroenterology

## 2010-08-04 HISTORY — PX: OTHER SURGICAL HISTORY: SHX169

## 2010-08-05 ENCOUNTER — Ambulatory Visit (HOSPITAL_COMMUNITY): Admission: RE | Admit: 2010-08-05 | Discharge: 2010-08-05 | Payer: Self-pay | Admitting: Gastroenterology

## 2010-08-05 ENCOUNTER — Ambulatory Visit: Payer: Self-pay | Admitting: Internal Medicine

## 2010-08-06 ENCOUNTER — Telehealth (INDEPENDENT_AMBULATORY_CARE_PROVIDER_SITE_OTHER): Payer: Self-pay

## 2010-09-07 ENCOUNTER — Telehealth (INDEPENDENT_AMBULATORY_CARE_PROVIDER_SITE_OTHER): Payer: Self-pay

## 2010-11-03 NOTE — Assessment & Plan Note (Signed)
Summary: PROBLEMS WITH COLOSTOMY - CDG   Visit Type:  Initial Consult Referring Provider:  Bridget Hartshorn Primary Care Provider:  Bridget Hartshorn  Chief Complaint:  malfunctioning colostomy.  History of Present Illness: [name] is here for Work-in appt for 3 day hx "colostomy bag not filling as often."  c/o mid-abd pain.  "cramp-like" 9/10 intermittant.  05/2008->colectomy w/ ileostomy for Crohns at [facility].  Has been doing well otherwise.  Denies seeing any blood.  Denies nausea, vomiting or fever.  GERD well-controlled.  Considering bilat knee surgery for arthritis.  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 Take 1 Tablet By Mouth Two Times A Day 2)  Wellbutrin Sr 150 Mg Xr12h-Tab (Bupropion Hcl) .... Take 1 Tablet By Mouth Once A Day 3)  Calcium 600 Mg .... Take 1 Tablet By Mouth Two Times A Day 4)  Metoprolol Tartrate 25 Mg Tabs (Metoprolol Tartrate) .... 1/2 Tablet Twice A Day 5)  Depakote Er 500 Mg Xr24h-Tab (Divalproex Sodium) .... Two Tablets At Bedtime. 6)  Risperdal 4 Mg Tabs (Risperidone) .... Two Tablets At Bed Time 7)  Trazodone Hcl 100 Mg Tabs (Trazodone Hcl) .... Two Tablets At Bedtime 8)  Furosemide 40 Mg Tabs (Furosemide) .... Take 1 Tablet By Mouth Once A Day 9)  Enablex 7.5 Mg Xr24h-Tab (Darifenacin Hydrobromide) .... Take 1 Tablet By Mouth Once A Day 10)  Benztropine Mesylate 1 Mg Tabs (Benztropine Mesylate) .... Take 1 Tablet By Mouth Once A Day 11)   Pravachol 20 Mg Tabs (Pravastatin Sodium) .... Take 1 Tablet By Mouth Once A Day 12)  Depakote Er 250 Mg Xr24h-Tab (Divalproex Sodium) .... One Tablet At Bedtime in Addition To The  Two 500 Mg Tablets. 13)  Ostomy Bags .... Use Prn 14)  Sulfasalazine 500 Mg Tabs (Sulfasalazine) .... 2 Two Times A Day 15)  Lamisil At 1 % Soln (Terbinafine Hcl) .... At Bedtime 16)  Cetirizine Hcl 10 Mg Tabs (Cetirizine Hcl) .... Take 1 Tablet By Mouth Once A Day 17)  Nabumetone 750 Mg Tabs (Nabumetone) .... Take 1 Tablet By Mouth Two Times A Day 18)  Rheumatrex 2.5 Mg Tabs (Methotrexate (Anti-Rheumatic)) .... Take Six Tablets On Sunday 19)  Folic Acid 1 Mg Tabs (Folic Acid) .... Take 1 Tablet By Mouth Once A Day 20)  Neurontin 300 Mg Caps (Gabapentin) .... Three Tablets At Bedtime 21)  Tamsulosin Hcl 0.4 Mg Caps (Tamsulosin Hcl) .... Take 1 Tablet By Mouth Once A Day 22)  Colcrys 0.6 Mg Tabs (Colchicine) .... Take 1 Tablet By Mouth Two Times A Day 23)  Hydrocodone-Acetaminophen 5-500 Mg Tabs (Hydrocodone-Acetaminophen) .... As Directed  Allergies (verified): 1)  ! Pcn 2)  ! Codeine 3)  ! Asa 4)  ! Ibuprofen 5)  ! Ultram  Past History:  Past Medical History: Last updated: 07/16/2009 Crohn's colitis Nystagmus Bipolar Disorder Asthma GERD Hyperlipidemia Insominia Enterococcus UTI 2009  Past Surgical History: Last updated: 07/16/2009 TOTAL COLECTOMY WITH ILEOSTOMY AUGUST 2009 LUE DVT after PICC LINE PLACEMENT 2009 HISTORY OF  BILATERAL SHOULDER REPLACEMENT RIGHT HIP REPLACEMENT 2o to avascular necrosis APPENDECTOMY EYE SURGERY KIDNEY SURGERIES  Review of Systems      See HPI General:  Denies fever, chills, sweats, anorexia, fatigue, weakness, malaise, weight loss, and sleep disorder; Wt gain. CV:  Denies chest pains, angina, palpitations, syncope, dyspnea on exertion, orthopnea, PND, peripheral edema, and claudication. Resp:  Denies dyspnea at rest, dyspnea with exercise, cough, sputum,  wheezing, coughing up blood, and pleurisy. GI:  Complains of change in bowel habits; denies difficulty swallowing, pain on swallowing, indigestion/heartburn, vomiting, vomiting blood, jaundice, diarrhea, bloody BM's, and black BMs. GU:  Denies urinary burning, blood in urine, urinary frequency, urinary hesitancy, nocturnal urination, and urinary incontinence. MS:  Complains of joint pain / LOM and joint swelling; knees. Derm:  Complains of dry skin; denies rash, itching, hives, moles, warts, and unhealing ulcers. Psych:  Denies depression, anxiety, memory loss, suicidal ideation, hallucinations, paranoia, phobia, and confusion. Heme:  Complains of bruising; denies bleeding and enlarged lymph nodes.  Vital Signs:  Patient profile:   45 year old male Height:      66 inches Weight:      227 pounds BMI:     36.77 Temp:     98.6 degrees F oral Pulse rate:   72 / minute BP sitting:   104 / 66  (right arm) Cuff size:   regular  Vitals Entered By: [name] LPN (April 01, 3531 9:92 PM)  Physical Exam  General:  Pale appearing, Well developed, well nourished, no acute distress. Head:  Normocephalic and atraumatic. Eyes:  nystagmus.   Mouth:  No deformity or lesions, dentition normal. Heart:  Regular rate and rhythm; no murmurs. Abdomen:  Ostomy site w/ mild protrusion, beefy red, scant brown exudate in bag.  Abd rotund, soft, without guarding, without rebound, and no hepatomegally or splenomegaly.   Msk:  Symmetrical with no gross deformities. Normal posture. Extremities:  clubbing Neurologic:  Alert and  oriented x4;  grossly normal neurologically. Skin:  Intact without significant lesions or rashes. Cervical Nodes:  No significant cervical adenopathy. Psych:  Alert and cooperative. Normal mood and affect.  Impression & Recommendations:  Problem # 1:  ILEOSTOMY (ICD-V44.2) Ileostomy w/ decreased output.  s/p colectomy w/ ileostomy for Crohn's disease. c/o crampy lower abd pain,   Plain abd films to r/o ileus or obstruction.  Orders: Est. Patient Level III (42683)  Problem # 2:  GERD (ICD-530.81) Well controlled  Problem # 3:  CROHN'S DISEASE, LARGE INTESTINE (ICD-555.1)  See #1  Orders: Est. Patient Level III (41962)  Patient Instructions: 1)  We will call w/ xray results 2)  To ER if severe pain

## 2010-11-03 NOTE — Letter (Signed)
Summary: [facility]  [facility]  [address]   [place], [place] [address]   Phone: [phone]  Fax: [phone]    01/01/2010  BURNICE OESTREICHER [address] [place], [place]  [address] Oct 12, 1965  Dear Mr. Shoultz,   Your physician has indicated that:   _______it is time to schedule an appointment.   _______you missed your appointment on______ and need to call and  reschedule.   _______you need to have lab work done.   _______you need to schedule an appointment to discuss lab or test results.   _______you need to call to reschedule your appointment that was scheduled on _________.   Please call our office at  [phone].    Thank you,    [facility] Ph: [phone]   Fax: [phone]

## 2010-11-03 NOTE — Progress Notes (Signed)
Summary: blood in stool  Phone Note Call from Patient Call back at Work Phone [phone]   Caller: Patient Summary of Call: pt called- he has noticed that his stool has turned pink. He stated the whole stool was pink not just some of it and it was loose. no fever, no abd pain, has a stomach cramp every once in awhile but that is it. He did go to his pcp today and he has a uti. pt wants to know if he needs to do anything. Informed pt that this late in the afternoon that it would be tomorrow before I was able to get with [facility] and if he has severe abd pain, etc he is to go to ED. pt voiced understanding.  please advise Initial call taken by: Burnadette Peter LPN,  September 08, 4627 4:54 PM     Appended Document: blood in stool Attempted to call pt. Ph busy.  Appended Document: blood in stool Ate raw broccoli. and then yesterday BMs were pink and today they are brown. Saw capsule came. Hurt when it came out. Stomach cramps come and goes. Lost 5 lbs. Joellen Jersey has him walking the halls 1 hour a day. Now she is DNR and Hospice. Discussed capsule results.

## 2010-11-03 NOTE — Medication Information (Signed)
Summary: Visual merchandiser   Imported By: Zeb Comfort 03/04/2010 11:37:25  _____________________________________________________________________  External Attachment:    Type:   Image     Comment:   External Document  Appended Document: RX Folder - omeprazole    Prescriptions: OMEPRAZOLE 20 MG CPDR (OMEPRAZOLE) Take 1 tablet by mouth two times a day  #60 x 6   Entered and Authorized by:   Laureen Ochs. Bernarda Caffey   Signed by:   Laureen Ochs Bernarda Caffey on 03/04/2010   Method used:   Electronically to        [facility].* (retail)       [address]       [place], [place]  [address]       Ph: [phone]       Fax: [phone]   RxID:   [id number]

## 2010-11-03 NOTE — Letter (Signed)
Summary: [employer]  [employer]   Imported By: Sofie Rower 07/30/2010 08:22:48  _____________________________________________________________________  External Attachment:    Type:   Image     Comment:   External Document  Appended Document: [employer] Dictated #[id number].  Attempted to call pt w/ results->no answer.

## 2010-11-03 NOTE — Progress Notes (Signed)
Summary: problems with colostomy  Phone Note Call from Patient Call back at Home Phone [phone] Call back at [phone]   Caller: Patient Summary of Call: received referral from [facility] for asap appt for pt having problems with his colostomy. Called pt- he stated he is having stomach cramps and is only having to empty his bag once a day. Alot of gas. Has had one episode of diarrhea last nite and the stool was black. pt wants to know if you can call in something or does he need appt? pt uses [facility]. please advise Initial call taken by: Burnadette Peter LPN,  March 31, 1791 1:78 PM     Appended Document: problems with colostomy Pt needs an urgent slot with an extender on TUE or WED.  Appended Document: problems with colostomy appt made for tuesday 03/31/10 with [facility]. pt aware and will try to find transportation.

## 2010-11-03 NOTE — Medication Information (Signed)
Summary: OMEPRAZOLE DR 20MG  OMEPRAZOLE DR 20MG   Imported By: Hoy Morn 06/11/2010 12:16:49  _____________________________________________________________________  External Attachment:    Type:   Image     Comment:   External Document  Appended Document: OMEPRAZOLE DR 58PR duplicate

## 2010-11-03 NOTE — Progress Notes (Signed)
Summary: abd pain, N/V, diarrhea  Phone Note Call from Patient Call back at Home Phone [phone]   Caller: Patient Summary of Call: pt called,([facility] pt)  had givens done this week. He saw the camera come out thru his colostomy bag. pt is now having N/V, diarrhea, sharp pain that comes and goes right in the middle of his stomach. He has not seen anymore blood in his stool or had a fever. Pt requesting something for pain called to [facility]  Initial call taken by: Burnadette Peter LPN,  August 06, 5808 3:20 PM     Appended Document: abd pain, N/V, diarrhea informed pt that I have paged RMR and am awaiting a return call. If RMR is unable to get back with me today and pts pain continues or gets worse pt should proceed to the ED.  Appended Document: abd pain, N/V, diarrhea I received no page yesterday from the office at this time; reported symptoms likely not related to capsule study; how is he doing today?  Appended Document: abd pain, N/V, diarrhea tried to call pt- NA  Appended Document: abd pain, N/V, diarrhea talked to pt- he is feeling much better now. He ate chinese food yesterday and it had raw broccoli in it and he feels like that is what made his stomach hurt. he is requesting a diet sheet. Diet sheet in mail to pt  Appended Document: abd pain, N/V, diarrhea noted

## 2010-11-03 NOTE — Letter (Signed)
Summary: [facility]  [address]   [place], [place] [address]   Phone: [phone]  Fax: [phone]     January 28, 2010   [name] [place],   [address] [birth year]  Dear [name],  During your last appointment, your doctor requested you have some blood work.  Our records indicate you have not had this done.  Remember it is very important to follow your doctor's instructions.   Please have this blood work done as soon as possible.  It is important that patients and their doctor work together in the management/treatment of their health care.  If you have lost or misplaced your lab orders, please give [place] a call and we will gladly give you a new copy.  If you have already had your blood work drawn, please disregard this letter.  Thank you,    Burnadette Peter LPN  [facility] Ph: [phone]   Fax: [phone]  Appended Document: Labs Reminder Please call [name] and remind him to get his labs drawn.  Appended Document: Labs Reminder tried to call pt- NA  Appended Document: Labs Reminder sent request to PCP for labs. pt had cbc, hgb A1c and UA done. HFP was not drawn.

## 2010-11-03 NOTE — Letter (Signed)
Summary: EGD ORDER  EGD ORDER   Imported By: Sofie Rower 07/15/2010 09:57:52  _____________________________________________________________________  External Attachment:    Type:   Image     Comment:   External Document

## 2010-11-03 NOTE — Medication Information (Signed)
Summary: Visual merchandiser   Imported By: Zeb Comfort 10/08/2009 10:54:36  _____________________________________________________________________  External Attachment:    Type:   Image     Comment:   External Document  Appended Document: RX Folder - ostomy bag    Prescriptions: OSTOMY BAGS use prn  #3 mo supply x 3   Entered and Authorized by:   Laureen Ochs. Bernarda Caffey   Signed by:   Laureen Ochs Eunique Balik PA-C on 10/08/2009   Method used:   Printed then faxed to ...       [facility].* (retail)       [address]       [place], [place]  [address]       Ph: [phone]       Fax: [phone]   RxID:   [id number]    Please fax to pharmacy  Appended Document: RX Folder Rx faxed to pharmacy

## 2010-11-03 NOTE — Progress Notes (Signed)
Summary: pt bleeding into his colostomy bag  Phone Note Call from Patient Call back at Home Phone [phone] Call back at Work Phone [phone]   Caller: Patient Summary of Call: pt called- left voicemail- pt stated he has been bleeding into his bag for 3-4 days. Pt has appt on Wed. 07/15/2010. Tried to call pt to get more information but only got voicemail. pt wanted to know what he should do. Initial call taken by: Burnadette Peter LPN,  July 13, 5461 3:36 PM     Appended Document: pt bleeding into his colostomy bag CBC, to ER if severe bleeding, pain, dizziness, weakness, SOB, etc.  Otherwise, keep OV WED.  Appended Document: pt bleeding into his colostomy bag tried to call pt- NA  Appended Document: pt bleeding into his colostomy bag tried to call pt- NA  Appended Document: pt bleeding into his colostomy bag pt called back- His bleeding has stopped now but he is a little weak but pt stated he would go to ED if he got worse. He has problems with transportation, he stated he couldnt get labs done untill tomorrow.

## 2010-11-03 NOTE — Letter (Signed)
Summary: Internal Other/radiology order  Internal Other/radiology order   Imported By: [name] LPN 22/56/7209 19:80:22  _____________________________________________________________________  External Attachment:    Type:   Image     Comment:   External Document

## 2010-11-03 NOTE — Assessment & Plan Note (Signed)
Summary: LARGE BOWEL CHRON'S   Visit Type:  Follow-up Visit Primary Care Provider:  Bridget Hartshorn, M.D.  Chief Complaint:  follow up- doing ok and some bleeding at times.  History of Present Illness: Emptying bag 3-4 times a day: soft. Rare belly pain. Watches what he eats: no popcorns. Still being treated for his right knee. Had MRI on knee. Rare blood in bag: bright red, streaks. Appetite: good. Energy level: s/t gets tired. Last blood draw.   Current Medications (verified): 1)  Omeprazole 20 Mg Cpdr (Omeprazole) .... Take 1 Tablet By Mouth Two Times A Day 2)  Wellbutrin Sr 150 Mg Xr12h-Tab (Bupropion Hcl) .... Take 1 Tablet By Mouth Once A Day 3)  Zyrtec Allergy 10 Mg Tabs (Cetirizine Hcl) .... Take 1 Tablet By Mouth Once A Day 4)  Calcium 600 Mg .... Take 1 Tablet By Mouth Two Times A Day 5)  Metoprolol Tartrate 25 Mg Tabs (Metoprolol Tartrate) .... 1/2 Tablet Twice A Day 6)  Depakote Er 500 Mg Xr24h-Tab (Divalproex Sodium) .... Two Tablets At Bedtime. 7)  Risperdal 4 Mg Tabs (Risperidone) .... Two Tablets At Bed Time 8)  Trazodone Hcl 100 Mg Tabs (Trazodone Hcl) .... Two Tablets At Bedtime 9)  Furosemide 40 Mg Tabs (Furosemide) .... Take 1 Tablet By Mouth Once A Day 10)  Enablex 7.5 Mg Xr24h-Tab (Darifenacin Hydrobromide) .... Take 1 Tablet By Mouth Once A Day 11)  Benztropine Mesylate 1 Mg Tabs (Benztropine Mesylate) .... Take 1 Tablet By Mouth Once A Day 12)  Pravachol 20 Mg Tabs (Pravastatin Sodium) .... Take 1 Tablet By Mouth Once A Day 13)  Depakote Er 250 Mg Xr24h-Tab (Divalproex Sodium) .... One Tablet At Bedtime in Addition To The  Two 500 Mg Tablets. 14)  Ostomy Bags .... Use Prn 15)  Rapaflo 8 Mg Caps (Silodosin) .... Once Daily 16)  Sulfasalazine 500 Mg Tabs (Sulfasalazine) .... 2 Two Times A Day 17)  Lamisil At 1 % Soln (Terbinafine Hcl) .... At Bedtime 18)  Neomycin-Polymyxin-Hc 3.5-10000-1 Susp (Neomycin-Polymyxin-Hc) .... Three Times A Day  Allergies (verified): 1)  !  Pcn 2)  ! Codeine 3)  ! Asa 4)  ! Ibuprofen 5)  ! Ultram  Past History:  Past Medical History: Last updated: 07/16/2009 Crohn's colitis Nystagmus Bipolar Disorder Asthma GERD Hyperlipidemia Insominia Enterococcus UTI 2009  Past Surgical History: Last updated: 07/16/2009 TOTAL COLECTOMY WITH ILEOSTOMY AUGUST 2009 LUE DVT after PICC LINE PLACEMENT 2009 HISTORY OF BILATERAL SHOULDER REPLACEMENT RIGHT HIP REPLACEMENT 2o to avascular necrosis APPENDECTOMY EYE SURGERY KIDNEY SURGERIES  Vital Signs:  Patient profile:   45 year old male Height:      66 inches Weight:      229 pounds BMI:     37.10 Temp:     97.9 degrees F oral Pulse rate:   76 / minute BP sitting:   110 / 78  (right arm) Cuff size:   regular  Vitals Entered By: Burnadette Peter LPN (January 10, 276 4:12 AM)  Physical Exam  General:  Well developed, well nourished, no acute distress. Head:  Normocephalic and atraumatic. Lungs:  Clear throughout to auscultation. Heart:  Regular rate and rhythm; no murmurs. Abdomen:  Soft, nontender and nondistended. Normal bowel sounds.  Impression & Recommendations:  Problem # 1:  CROHN'S DISEASE, LARGE INTESTINE (ICD-555.1) Assessment Unchanged  S/P ILEOSTOMY. Doing well. No evidence of active disease. Needs CBC/HFP once a year. Rx given for pt to have drawn with Dr. Bridget Hartshorn. Fax results to me. OPV  in 6 mos. Refilled ostomy bag.  CC: PCP  Orders: Est. Patient Level II (86825) Prescriptions: OSTOMY BAGS use prn  #3 mo supply x 3   Entered and Authorized by:   Danie Binder MD   Signed by:   Danie Binder MD on 01/09/2010   Method used:   Print then Give to Patient   RxID:   [id number]   Appended Document: [place] reminder appt for 66mfollow up made- cdg    Appended Document: LSilver CreekAPR 2011 WBC 5.5 HB 13.9 PLT 191 HGA1C 5.2 GLU 84  Appended Document: LSabethaWBC 6.3 HB 14.4 PLT 177.  NEEDS HFP WITH  NEXT VISIT.

## 2010-11-03 NOTE — Assessment & Plan Note (Signed)
Summary: MELENA, CROHN'S DISEASE   Visit Type:  Follow-up Visit Primary Care Provider:  Bridget Hartshorn, M.D.  Chief Complaint:  Black return in ostomy bag.  History of Present Illness: Didn't get blood drawn. Doing well until FRI. Saw black return in ostomy bag. Saw black return 5x/day for 3 days. It quit yesterday. No cough, SOB, fever, dizzy, problems swallowing, bloody nose.  Abd pain: middle, sharp, didn't move, off and on, lasted 20 mins, No NV, chills. Pain went away yesterday. Appetite: good. Heartburn at nigt for 4 days: in iddle of chest. TAKES NABUMETONE: > 1 YEAR,  no additional NSAIDs. PPI BID. No EtOH. Quit smoking 4 years ago.  Current Medications (verified): 1)  Omeprazole 20 Mg Cpdr (Omeprazole) .... Take 1 Tablet By Mouth Two Times A Day 2)  Wellbutrin Sr 150 Mg Xr12h-Tab (Bupropion Hcl) .... Take 1 Tablet By Mouth Once A Day 3)  Calcium 600 Mg .... Take 1 Tablet By Mouth Two Times A Day 4)  Metoprolol Tartrate 25 Mg Tabs (Metoprolol Tartrate) .... 1/2 Tablet Twice A Day 5)  Depakote Er 500 Mg Xr24h-Tab (Divalproex Sodium) .... Two Tablets At Bedtime. 6)  Risperdal 4 Mg Tabs (Risperidone) .... Two Tablets At Bed Time 7)  Trazodone Hcl 50 Mg Tabs (Trazodone Hcl) .... Two Tablets At Bedtime 8)  Furosemide 40 Mg Tabs (Furosemide) .... Take 1 Tablet By Mouth Once A Day 9)  Enablex 7.5 Mg Xr24h-Tab (Darifenacin Hydrobromide) .... Take 1 Tablet By Mouth Once A Day 10)  Benztropine Mesylate 1 Mg Tabs (Benztropine Mesylate) .... Take 1 Tablet By Mouth Once A Day 11)  Pravachol 20 Mg Tabs (Pravastatin Sodium) .... Take 1 Tablet By Mouth Once A Day 12)  Depakote Er 250 Mg Xr24h-Tab (Divalproex Sodium) .... One Tablet At Bedtime in Addition To The  Two 500 Mg Tablets. 13)  Ostomy Bags .... Use Prn 14)  Sulfasalazine 500 Mg Tabs (Sulfasalazine) .... 2 Two Times A Day 15)  Cetirizine Hcl 10 Mg Tabs (Cetirizine Hcl) .... Take 1 Tablet By Mouth Once A Day 16)  Nabumetone 750 Mg Tabs  (Nabumetone) .... Take 1 Tablet By Mouth Two Times A Day 17)  Rheumatrex 2.5 Mg Tabs (Methotrexate (Anti-Rheumatic)) .... Take Six Tablets On Sunday 18)  Folic Acid 1 Mg Tabs (Folic Acid) .... Take 1 Tablet By Mouth Once A Day 19)  Neurontin 300 Mg Caps (Gabapentin) .... Three Tablets At Bedtime 20)  Tamsulosin Hcl 0.4 Mg Caps (Tamsulosin Hcl) .... Take 1 Tablet By Mouth Once A Day 21)  Colcrys 0.6 Mg Tabs (Colchicine) .... Take 1 Tablet By Mouth Two Times A Day 22)  Hydrocodone-Acetaminophen 5-500 Mg Tabs (Hydrocodone-Acetaminophen) .... As Directed  Allergies (verified): 1)  ! Pcn 2)  ! Codeine 3)  ! Asa 4)  ! Ibuprofen 5)  ! Ultram  Past History:  Past Medical History: Reviewed history from 07/16/2009 and no changes required. Crohn's colitis Nystagmus Bipolar Disorder Asthma GERD Hyperlipidemia Insominia Enterococcus UTI 2009  Family History: No FH of Colon Cancer or polyps  Social History: Used to sing in the choir. Occupation: used to work in [employer], and [employer]  Vital Signs:  Patient profile:   45 year old male Height:      66 inches Weight:      227 pounds BMI:     36.77 Temp:     98 .7 degrees F oral Pulse rate:   80 / minute BP sitting:   120 / 80  (left arm) Cuff  size:   regular  Vitals Entered By: [name] LPN (July 15, 5536 9:21 AM)  Physical Exam  General:  Well developed, well nourished, no acute distress. Head:  Normocephalic and atraumatic. Eyes:  nystagmus.   Mouth:  No deformity or lesions. Neck:  Supple; no masses. Lungs:  Clear throughout to auscultation. Heart:  Regular rate and rhythm; no murmurs. Abdomen:  Ostomy site w/ mild protrusion, brown exudate in bag.  Abd rotund, soft, without guarding, without rebound.    Impression & Recommendations:  Problem # 1:  MELENA (ICD-578.1) Last episode yesterday. Differential diagnosis PUD, Dieulafoy's lesion, or AVM. STOP TAKING NABUMETONE. Continue omeprazole. Upper  endoscopy on 10/17. Will draw labs on 10/17. PT DID NOT WANT EGD ON HIS BIRTHDAY. Labs and EGD delayed due to pt transportation issues. Will go to ED if black stools return. Follow up in 6 mos.  CC: PCP  Problem # 2:  SMOLM'B DISEASE, LARGE INTESTINE (ICD-555.1) STOP TAKING SULFASALAZINE. CMP yearly to screen for PSC.  Patient Instructions: 1)  STOP TAKING NABUMETONE and SULFASALAZINE. 2)  Continue omeprazole. 3)  Upper endoscopy on 10/17. 4)  Will draw labs on 10/17. 5)  Follow up in 6 mos. 6)  The medication list was reviewed and reconciled.  All changed / newly prescribed medications were explained.  A complete medication list was provided to the patient / caregiver.  Appended Document: MELENA, CROHN'S DISEASE 6 MONTH F/U REMINDER IS IN THE COMPUTER  Appended Document: Orders Update    Clinical Lists Changes  Orders: Added new Service order of Est. Patient Level V (86754) - Signed

## 2010-11-03 NOTE — Letter (Signed)
Summary: Internal Other/radiology order  Internal Other/radiology order   Imported By: [name] LPN 56/31/4970 26:37:85  _____________________________________________________________________  External Attachment:    Type:   Image     Comment:   External Document

## 2010-11-30 ENCOUNTER — Encounter: Payer: Self-pay | Admitting: Urgent Care

## 2010-12-10 NOTE — Medication Information (Signed)
Summary: CONVATEC  CONVATEC   Imported By: Hoy Morn 11/30/2010 08:29:34  _____________________________________________________________________  External Attachment:    Type:   Image     Comment:   External Document  Appended Document: CONVATEC    Prescriptions: CONVATEC 06301 POUCH   #10 x 3   Entered and Authorized by:   Andria Meuse FNP-BC   Signed by:   Andria Meuse FNP-BC on 11/30/2010   Method used:   Faxed to ...       [facility] (retail)       [address]       [place], [place]  [address]       Ph: [phone]       Fax: [phone]   RxID:   [id number]

## 2010-12-16 LAB — COMPREHENSIVE METABOLIC PANEL
CO2: 27 mEq/L (ref 19–32)
Calcium: 9.6 mg/dL (ref 8.4–10.5)
Creatinine, Ser: 1.12 mg/dL (ref 0.4–1.5)
GFR calc non Af Amer: >60 mL/min (ref >60)
Glucose, Bld: 87 mg/dL (ref 70–99)

## 2010-12-16 LAB — CBC
HCT: 41.5 % (ref 39.0–52.0)
Hemoglobin: 14.2 g/dL (ref 13.0–17.0)
MCH: 31.3 pg (ref 26.0–34.0)
MCHC: 34.2 g/dL (ref 30.0–36.0)

## 2010-12-16 LAB — FERRITIN: Ferritin: 82 ng/mL (ref 22–322)

## 2010-12-16 LAB — DIFFERENTIAL
Eosinophils Absolute: 0.1 10*3/uL (ref 0.0–0.7)
Lymphocytes Relative: 43 % (ref 12–46)
Lymphs Abs: 2.4 10*3/uL (ref 0.7–4.0)
Neutrophils Relative %: 42 % — ABNORMAL LOW (ref 43–77)

## 2010-12-22 ENCOUNTER — Observation Stay (HOSPITAL_COMMUNITY)
Admission: RE | Admit: 2010-12-22 | Discharge: 2010-12-23 | Disposition: A | Discharge status: Home / Self Care | Payer: Medicaid Other | Source: Ambulatory Visit | Attending: Oral Surgery | Admitting: Oral Surgery

## 2010-12-22 ENCOUNTER — Ambulatory Visit (HOSPITAL_COMMUNITY): Payer: Medicaid Other

## 2010-12-22 DIAGNOSIS — M278 Other specified diseases of jaws: Secondary | ICD-10-CM | POA: Insufficient documentation

## 2010-12-22 DIAGNOSIS — K029 Dental caries, unspecified: Principal | ICD-10-CM | POA: Insufficient documentation

## 2010-12-22 DIAGNOSIS — K219 Gastro-esophageal reflux disease without esophagitis: Secondary | ICD-10-CM | POA: Insufficient documentation

## 2010-12-22 DIAGNOSIS — J4489 Other specified chronic obstructive pulmonary disease: Secondary | ICD-10-CM | POA: Insufficient documentation

## 2010-12-22 DIAGNOSIS — J449 Chronic obstructive pulmonary disease, unspecified: Secondary | ICD-10-CM | POA: Insufficient documentation

## 2010-12-22 DIAGNOSIS — K5090 Crohn's disease, unspecified, without complications: Secondary | ICD-10-CM | POA: Insufficient documentation

## 2010-12-22 LAB — BASIC METABOLIC PANEL
GFR calc non Af Amer: >60 mL/min (ref >60)
Glucose, Bld: 100 mg/dL — ABNORMAL HIGH (ref 70–99)
Potassium: 5 mEq/L (ref 3.5–5.1)
Sodium: 141 mEq/L (ref 135–145)

## 2010-12-22 LAB — CBC
HCT: 40.8 % (ref 39.0–52.0)
RDW: 14.3 % (ref 11.5–15.5)
WBC: 5.3 10*3/uL (ref 4.0–10.5)

## 2010-12-28 NOTE — Op Note (Signed)
NAMELAURENS, MATHENY                ACCOUNT NO.:  [id number]  MEDICAL RECORD NO.:  [id number]           PATIENT TYPE:  I  LOCATION:  3810                         FACILITY:  [facility]  PHYSICIAN:  Gae Bon, M.D.  DATE OF BIRTH:  November 30, 1965  DATE OF PROCEDURE:  12/22/2010 DATE OF DISCHARGE:                              OPERATIVE REPORT   PREOPERATIVE DIAGNOSIS:  Nonrestorable teeth #2, #3, #4, #5, #6, #7, #8, #10, #11, #12, #13, #15, #16, #19, #20, #21, #23, #24, #25, #26, #28, mandibular right lingual torus.  PROCEDURES:  Removal of teeth numbers as per above, alveoplasty maxilla mandible, removed torus right mandible, left maxillary tuberosity reduction.  SURGEON:  Gae Bon, MD  ANESTHESIA:  General oral, Lorrene Reid, MD  ASSISTANTS:  Janeice Robinson and Simlar.  INDICATION FOR PROCEDURE:  The patient is a 45 year old who was referred to my office by his general dentist for removal of all remaining teeth with resection of 2 lower canines.  The patient has history of COPD, Crohn disease, psychiatric disease, shoulder hip replacement.  Because of medical issues and the need for general anesthesia because of the extensiveness of the procedure, the surgery was scheduled for general anesthesia with intubation for airway protection.  PROCEDURE:  The patient was taken to the operating room, placed on the table in a supine position.  General anesthesia was administered intravenously and oral endotracheal tube was placed and marked.  The patient was then draped for the procedure after the eyes were protected and then the posterior pharynx was suctioned with the Yankauer suction and throat pack was placed.  2% lidocaine with 1:100,000 epinephrine was infiltrated in inferior alveolar block on the right and left side and maxillary buccal and palatal infiltration around the teeth to be removed.  Bite block was placed on the right side of the mouth and the left side was operated first.   A #15 blade was used to make a full- thickness incision approximately 1 cm distal to the tooth #20 and carried around teeth #20, #21, #23, #24, #25, #26 both buccally and lingually.  The periosteum was reflected with a periosteal elevator. Bone was removed and approximated around these teeth.  Then the teeth were elevated and removed with the Ash forceps.  Sockets were curetted. Alveoplasty was performed with the egg-shaped bur and the bone file. Then the area was irrigated and closed with 3-0 chromic in the maxilla. A #15 blade was used to make a full-thickness incision around teeth #7, #8, #10, #11, #12, #13, #15, and #16.  The periosteum was reflected. Bone was removed around teeth #15, #13, #12, and #11.  Tooth #16 was elevated with a 301 elevator as it was completely bony impacted.  The tooth was removed with the elevator and was fused to tooth #15 at the midportion of the roots.  Upon removing this malformed tooth, portion of the tuberosity fractured on the left side requiring tuberosity reduction.  The remaining teeth #7, #8, #10, #11, #12, and #13 were elevated with 301 elevator and removed the mouth with the upper universal forceps.  The periosteum was then  further reflected to expose the alveolar ridge.  Alveoplasty was performed with the egg-shaped bur and bone file.  Initially, the area was sutured without performing tuberosity reduction, but it was realized that there was a bulbous amount of tissue in this area.  Therefore a 15-blade was used to make an elliptical incision of the bulbous tuberosity region and then the area was re-closed with 3-0 chromic then the block and endotracheal tube were repositioned on the left side of the mouth and attention was turned to the right side and a #15 blade was used make a full-thickness incision overlying teeth #28 and along the edentulous mandibular ridge on the right side.  The incision was also made around teeth #23, #24, #25,  and #26, and in the maxilla around teeth #2, #3, #4, #5, #6.  Periosteal elevator was used to reflect the periosteum around these teeth and then the bone was removed approximately around the posterior teeth with a striker handpiece under irrigation.  A 301 elevator was used to elevate the teeth and then the teeth were removed with the upper universal forceps in the maxilla and with Ash forceps in the mandible.  Then the alveolus further reflected and alveoplasty was performed in the maxilla and mandible.  In the mandible, the lingual dissection was carried further down along the lingual aspect of the mandible.  A large 4-cm exostosis was removed using an egg-shaped bur and the bone file.  Then the maxilla mandible was irrigated, closed with 3-0 chromic.  The oral cavity was inspected, found to have good closure and contour.  The posterior pharynx was suctioned.  Throat packs were placed.  The patient was awakened, extubated, taken to the recovery room breathing spontaneously in good condition.  ESTIMATED BLOOD LOSS:  Minimum.  COMPLICATIONS:  None.  SPECIMENS:  None.     Gae Bon, M.D.     SMJ/MEDQ  D:  12/22/2010  T:  12/23/2010  Job:  [id number]  Electronically Signed by Diona Browner M.D. on 12/28/2010 04:17:36 PM

## 2011-01-11 NOTE — Discharge Summary (Signed)
  [name], [name] NO.:  [id number]  MEDICAL RECORD NO.:  [id number]           PATIENT TYPE:  I  LOCATION:  8768                         FACILITY:  [facility]  PHYSICIAN:  Gae Bon, M.D.  DATE OF BIRTH:  May 25, 1966  DATE OF ADMISSION:  12/22/2010 DATE OF DISCHARGE:  12/23/2010                              DISCHARGE SUMMARY   ADMITTING DIAGNOSIS:  Nonrestorable teeth and mandibular right lingual torus.  DISCHARGE DIAGNOSIS:  Nonrestorable teeth and mandibular right lingual torus.  HOSPITAL COURSE:  The patient was brought to the hospital for removal of multiple nonrestorable teeth and a right lingual torus.  Surgery performed on December 22, 2010 under general anesthesia.  The patient tolerated the surgery well.  Postoperatively, he had no one to take him home, so by default he was admitted for overnight observation.  He was discharged on December 23, 2010 with a prescription for Percocet 10/325 x40 one to two every 4-6 hours p.r.n. pain.  Instructions to increase activity slowly, eat soft foods, and ice on his face for 3 days.  He was scheduled for a followup in my office in 2 weeks for followup.  CONDITION AT DISCHARGE:  Much improved, mild swelling of the mouth.     Gae Bon, M.D.     SMJ/MEDQ  D:  01/07/2011  T:  01/08/2011  Job:  [id number]  Electronically Signed by Diona Browner M.D. on 01/11/2011 11:23:37 AM

## 2011-02-16 NOTE — Assessment & Plan Note (Signed)
NAMEMarland Kitchen  [name], [name]                 CHART#:  [id number]   DATE:  11/07/2008                       DOB:  03/01/1966   PRIMARY PHYSICIAN:  Manual Meier. Bridget Hartshorn, MD   PROBLEM LIST:  1. Crohn's colitis, status post total colectomy with ileostomy in      August 2009.  2. History of left upper extremity deep vein thrombosis associated      with PICC line placement.  3. Nystagmus.  4. Hyperlipidemia.  5. Bipolar disorder.  6. Asthma.  7. Gastroesophageal reflux disease.  8. Insomnia.  9. Enterococcus urinary tract infection in April 2009.  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   OBJECTIVE:  The patient is a 45 year old male presents as a return  patient visit.  He was last seen in September 2010.  He was on  prednisone 10 mg at that time.  The patient was seen by Dr. Renee Harder  and the understanding was that his prednisone was to be tapered off.  However, the patient continues to be on prednisone 10 mg daily.  He  feels pretty good.  He has gained 4-5 pounds.  He blames it on the  steroids.  He has mild left-sided abdominal pain once a month.  His  sleeping is so-so.  He is now having 2 bowel movements a day.  He does  have some bowel movements that occur in the middle of the night at 2:00  a.m.Marland Kitchen   MEDICATIONS:  1. Omeprazole 20 mg b.i.d.  2. Bupropion SR 150 mg daily.  3. Zyrtec.  4. Calcium.  5. Potassium.  6. Benztropine.  7. Metoprolol 12.5 mg b.i.d.  8. Prednisone 10 mg daily.  9. Depakote.  10.Risperidone.  11.Trazodone.  12.Lovastatin.  13.Cyclobenzaprine as needed.  14.Lasix 40 mg daily.   OBJECTIVE:  VITAL SIGNS:  Weight 200 pounds, height 5 feet 6 inches, BMI  32.3 (obese), temperature 97.8, blood pressure 90/70, and pulse  84.GENERAL:  He is in no apparent distress.  Alert and oriented  x4.HEENT:  Atraumatic, normocephalic.  Pupils equal and reactive to  light.  Mouth, no oral lesions.  Posterior pharynx without erythema  or  exudate.  Nystagmus is present.  LUNGS:  Clear to auscultation bilaterally.CARDIOVASCULAR:  Regular  rhythm.  No murmur.  Normal S1, S2.  ABDOMEN:  Bowel sounds are present, soft, nontender, nondistended with  an ileostomy in the right lower quadrant.   ASSESSMENT:  The patient is a 45 year old male with Crohn's colitis.  He  is currently asymptomatic.  He does have occasional intermittent left  lower quadrant pain.  He continues on steroids. Thank you for allowing  me to see the patient in consultation.  My recommendations follow.   RECOMMENDATIONS:  1. Will taper his prednisone over the next 4 months.  He is to be on      prednisone 8 mg for 2 weeks, then 6 mg for 2 weeks, then 4 mg for 2      weeks, then 2 mg for 1 month, and 1 mg for 1 month.  2. Will check a C-reactive protein to assess for inflammation.  3. Should be return patient visit in 3 months.   ADDENDUM 3110:  CRP mildly elevated: 1.5 (NL < 0.6). CT: no active  disease.  Taper steroids.       Caro Hight, M.D.  Electronically Signed     SM/MEDQ  D:  11/07/2008  T:  11/07/2008  Job:  [id number]   cc:   Manual Meier. Bridget Hartshorn, MD

## 2011-02-16 NOTE — H&P (Signed)
[name], [name]                ACCOUNT NO.:  [id number]   MEDICAL RECORD NO.:  [id number]           PATIENT TYPE:  AMB   LOCATION:  DAY                           FACILITY:  [facility]   PHYSICIAN:  [name], M.D.      DATE OF BIRTH:  1966-07-14   DATE OF ADMISSION:  07/04/2007  DATE OF DISCHARGE:  LH                              HISTORY & PHYSICAL   REFERRING PHYSICIAN:  Dr. Bridget Lane   CHIEF COMPLAINT:  Weight loss, anemia, and bloody diarrhea, history of  Crohn's colitis.   HISTORY OF PRESENT ILLNESS:  The patient is a 45 year old Caucasian male  previous patient of Dr. Laural Lane.  He has chosen to follow up with [name], M.D.  He has history of Crohn's disease mostly left-sided  disease.  He was initially diagnosed in 2003 by Dr. Algis Lane.  He has  been followed by our office.  He was previously on Asacol and high dose  prednisone by [name]. [name], M.D.  He was changed to Colazal  approximately one year ago.  He was also advised to discontinue his  Naprosyn and any other NSAIDS.  He had history of avascular necrosis of  the right hip, status post right hip replacement approximately a little  over one year ago.  For the last couple of months, he has noticed bright  red blood and mucous in his stools, having upward of 7-8 stools a day.  He denies any NSAIDS use at this time.  He does complain of low  abdominal cramping.  He was seen by Dr. Bridget Lane and started on  prednisone 60 mg daily for three days, then 40 mg for three days, then  20 mg for three days.  He is also on omeprazole 20 mg daily.  He denies  any fever or chills.  He has had intermittent flares and has had to use  Canasa suppositories or Rowasa enemas.   PAST MEDICAL HISTORY:   PAST SURGICAL HISTORY:  Crohn's colitis diagnosed by Dr. Algis Lane in  2003, followed by Dr. Tamala Lane and then subsequently by Dr. Laural Lane in 2005.  Currently on Colazal.  See HPI.  Status post appendectomy in 2005 by  [name], M.D.  for subacute appendicitis.  Right inguinal hernia.  History of hypercholesterolemia, asthma, GERD, bipolar disorder,  insomnia.  Last EGD by Dr. Laural Lane on January 31, 2004; he had erosive  esophagitis, changes suggestive of the beginning of Barrett's esophagus,  small sliding hiatal hernia, antral erythema, and he has suggested a  follow-up EGD in four years which would be April of 2009.  He had a  colonoscopy on July 23, 2004, by Dr. Irving Lane which showed Crohn's  colitis of the descending colon, sigmoid, and rectum.  Multiple random  biopsies were obtained which showed chronic active colitis.   CURRENT MEDICATIONS:  1. Combivent q.i.d.  2. Darvocet p.r.n.  3. Albuterol inhaler p.r.n.  4. Bupropion SR 150 mg b.i.d.  5. Omeprazole 20 mg b.i.d.  6. Depakote ER 1 gram q.h.s.  7. Risperdal 6 mg q.h.s.  8. Trazodone  100 mg q.h.s.  9. Lipitor 10 mg q.h.s.  10.Colazal 2.25 grams t.i.d.  11.Ferrous sulfate 325 mg t.i.d. (started yesterday).  12.Prednisone 60 mg daily started yesterday.  13.Zyrtec 10 mg daily.   ALLERGIES:  PENICILLIN, IBUPROFEN, CODEINE, ASPIRIN.   FAMILY HISTORY:  There is no known family history of colorectal  carcinoma or lower GI tract problems.   SOCIAL HISTORY:  The patient resides at a group home.  He denies any  tobacco, alcohol, or drug use.   REVIEW OF SYSTEMS:  See HPI.   PHYSICAL EXAMINATION:  VITAL SIGNS:  Weight 184 pounds, height 66  inches, temperature 98.7, blood pressure 102/72, pulse 60.  GENERAL:  The patient is a 45 year old Caucasian male who is alert and  oriented, pleasant, cooperative, in no acute distress.  He does appear  pale, however, this is his baseline.  HEENT:  Sclerae clear and anicteric.  Conjunctivae pale.  Oropharynx  pink and moist without any lesions.  NECK:  Supple without mass or thyromegaly.  HEART:  Regular rate and rhythm with normal S1 and normal S2 without  murmurs, rubs, or gallops.  LUNGS:  Clear to  auscultation bilaterally.  ABDOMEN:  Positive bowel sounds x4.  No bruits auscultated.  Soft,  nontender, and nondistended without palpable mass or hepatosplenomegaly.  No rebound tenderness or guarding.  EXTREMITIES:  Without clubbing or edema bilaterally.  SKIN:  Pale, warm and dry without any rash or jaundice.   IMPRESSION:  The patient is a 45 year old Caucasian male with history of  Crohn's colitis.  He has had bloody diarrhea for a couple of months now.  He has been maintained on Colazal, has history of using frequent NSAIDS.  He had a hemoglobin of 8.4 yesterday.  He denies any recent NSAIDS use.  It appears he is having a flare of his Crohn's colitis/distal disease.  He also has chronic GERD and tells me it is well controlled at this  time.   PLAN:  1. He is to avoid any and all NSAIDS.  2. He is to continue Colazal 2.25 grams t.i.d.  3. Continue prednisone for now, although, given his history of      avascular necrosis, this is not the drug of choice for the patient      and we are going to need to find other ways to treat his Crohn's      disease.  4. CBC, sed rate, and CMP.  5. Colonoscopy with Dr. Stann Lane in the near future to determine extent      of his disease and best treatment, also to      look for underlying infections such as CMV.  6. He is to hold his iron until after the procedures.  7. He may need blood transfusion pending CBC prior to colonoscopy.      [name], N.P.      [name], M.D.  Electronically Signed    KJ/MEDQ  D:  07/04/2007  T:  07/04/2007  Job:  [id number]

## 2011-02-16 NOTE — Group Therapy Note (Signed)
[name], [name]                ACCOUNT NO.:  [id number]   MEDICAL RECORD NO.:  [id number]          PATIENT TYPE:  INP   LOCATION:  F790                          FACILITY:  [facility]   PHYSICIAN:  Audria Nine, M.D.DATE OF BIRTH:  May 23, 1966   DATE OF PROCEDURE:  03/06/2008  DATE OF DISCHARGE:                                 PROGRESS NOTE   SUBJECTIVE:  The patient feels much better.  The left upper arm swelling  is better,  pain is also better controlled.  He denies any fevers or  chills.  No headaches, dizziness or lightheadedness.   OBJECTIVE:  GENERAL:  The patient is alert, comfortable, not in acute  distress.  Well oriented in time, place and person.  VITAL SIGNS:  Blood pressure was 116/72 with a pulse of 96, respiration  20, temperature 98 degrees Fahrenheit, oxygen saturation was 98% on room  air.  HEENT:  Normocephalic, atraumatic.  Oral mucosa was moist with no  exudates.  NECK:  Supple.  No JVD, no lymphadenopathy.  LUNGS:  Reduced air entry bilaterally.  No crackles, wheezes or rhonchi  were heard.  HEART:  S1 and S2 regular.  No S3, S4, gallops or rubs.  ABDOMEN:  Soft, nontender.  Bowel sounds positive.  EXTREMITIES:  The patient has a swollen left upper arm although this is  significantly improved when compared to previous.   LABORATORY/DIAGNOSTIC DATA:  White blood cell count 3.0, hemoglobin of  8.8, hematocrit 25.1, platelet count was 173 with no left shift.  PT was  23.1, INR was 1.9.  Sodium was 139, potassium 2.2.  The patient is  currently getting replacement.  The CO2 was 29, glucose 76, BUN of 2,  creatinine 0.76.  AST was 74, ALT of 42.   ASSESSMENT/PLAN:  1. Left upper extremity deep venous thrombosis.  The patient may have      extension of the clot as his INR was subtherapeutic when he came      in.  We will continue Coumadin with Lovenox.  I will probably      switch him over to Coumadin tomorrow.  2. Crohn's disease.  The patient is stable.   He has no abdominal pain      or diarrhea.  3. History of bilateral pulmonary embolism with infarction.  The      patient has no shortness of breath.  He is not hypoxic.   The patient remains on all his home medications.   DISPOSITION:  Once the patient's INR is therapeutic, we will discontinue  the Lovenox and will be transferring him out to an SNF.      Audria Nine, M.D.  Electronically Signed     AM/MEDQ  D:  03/06/2008  T:  03/06/2008  Job:  [id number]

## 2011-02-16 NOTE — Op Note (Signed)
NAMESABIAN, KUBA                ACCOUNT NO.:  [id number]   MEDICAL RECORD NO.:  [id number]          PATIENT TYPE:  INP   LOCATION:  A204                          FACILITY:  [facility]   PHYSICIAN:  R. Garfield Cornea, M.D. DATE OF BIRTH:  1965-12-19   DATE OF PROCEDURE:  09/15/2007  DATE OF DISCHARGE:                               OPERATIVE REPORT   PROCEDURE:  Diagnostic EGD.   [name] is a 45 year old gentleman with Crohn's colitis not felt  to be at risk of having intermittent bloody diarrhea, also report some  coffee-ground emesis leading hospitalization, although he has had none  since hospitalized.  EGD is now being done.  This approach has been  discussed with the patient at length.  Potential risks, benefits and  alternatives have been reviewed, questions answered, he is agreeable.  Please see documentation in the medical record.   PROCEDURE NOTE:  O2 saturation, blood pressure, pulse and respirations  were monitored throughout the entire procedure.   CONSCIOUS SEDATION:  Versed 5 mg IV, Demerol 75 mg IV in divided doses.  Cetacaine spray for topical pharyngeal anesthesia.   FINDINGS:  Examination of tubular esophagus revealed no mucosal  abnormalities.  EG junction easily traversed.   Stomach:  Gastric cavity was emptied and insufflated well with air.  Thorough examination of the gastric mucosa including retroflexed view of  the proximal stomach and esophagogastric junction demonstrated only a  small hiatal hernia.  There was no blood in the stomach.  Mucosa  appeared entirely normal.  Pylorus was patent and easily traversed.  Examination of the bulb and second portion revealed no abnormalities.   THERAPEUTIC/DIAGNOSTIC MANEUVERS PERFORMED:  None.   The patient tolerated procedure well as reactive to endoscopy.   IMPRESSION:  Normal esophagus, small hiatal hernia, otherwise normal  stomach, normal D1 and S2.   ASSESSMENT:  I suspect a very trivial, if any, upper  gastrointestinal  bleeding recently.   RECOMMENDATIONS:  Dr. Stann Mainland will see [name] in the morning and will  make further recommendations in regards to management of his Crohn's  colitis.  He has been switched from Colazal to Asacol.  I agree he may  well be a biologic therapy candidate, particularly an agent like Cimzia  which could be given by a home health nurse.  Would also consider TMPT  assay to help guide clinical management if he is to remain on Imuran  much longer.      Bridgette Habermann, M.D.  Electronically Signed     RMR/MEDQ  D:  09/15/2007  T:  09/17/2007  Job:  [id number]

## 2011-02-16 NOTE — Discharge Summary (Signed)
[name], [name] NO.:  [id number]   MEDICAL RECORD NO.:  [id number]          PATIENT TYPE:  INP   LOCATION:  A204                          FACILITY:  [facility]   PHYSICIAN:  Caro Hight, M.D.      DATE OF BIRTH:  [birth year]   DATE OF ADMISSION:  09/14/2007  DATE OF DISCHARGE:  12/15/2008LH                               DISCHARGE SUMMARY   PRIMARY CARE PHYSICIAN:  Marval Regal, MD.   PRIMARY GASTROENTEROLOGIST:  Caro Hight, M.D.   DISCHARGE DIAGNOSIS:  1. Crohn's colitis flare.  2. Bipolar disorder.  3. Arthritis.  4. History of avascular necrosis.  5. Hyperlipidemia.  6. Asthma.  7. Gastroesophageal reflux disease   HISTORY OF PRESENT ILLNESS:  [name] is a 45 year old male who was  diagnosed with Crohn's colitis in 2003.  This disease has never been  well controlled.  His last colonoscopy was in October 2008 which showed  pan colitis, most pronounced in the left colon.  He was being maintained  on Colazal and periodic use of steroids.  In October, he was started on  Imuran 50 mg daily.  He also was having transfusion-dependent anemia  because of his rectal bleeding and diarrhea.   HOSPITAL COURSE:  [name] presented to the emergency department  complaining of weakness, diarrhea and abdominal pain.  He also  complained of having a temperature of 101.  On admission, his hemoglobin  was 8.9.  His white count was 10.6.  His ferritin was 19, and his stool  was Hemoccult positive.  His outpatient regimen consisted of  Prednisone  10 mg daily, 2.  Imuran 75 mg daily, and Colazal three times a day.  Colazal was discontinued.  His prednisone was increased to 40 mg daily.  He was continued on his Imuran 75 mg daily.  He had a PPD placed which  was negative.  After the steroids were increased to 40 mg, his bloody  diarrhea stopped, and his abdominal pain was relieved.  He has a history  of avascular necrosis, and ideally we would like to get him off of  the  steroids.  He was also maintained on calcium carbonate with vitamin D  and oral iron supplementation.   On the day of discharge,  he is afebrile, hemodynamically stable, and  having no abdominal pain or bloody diarrhea.  He was having three to  five loose stools a day.  His white count was normal.   DISCHARGE MEDICATIONS:  1. Imuran 75 mg daily.  2. Prednisone 40 mg daily for 2 weeks, then 30 mg daily for 2 weeks,      then 20 mg daily for 2 weeks, and then 10 mg daily for 2 weeks.  3. Calcium carbonate with vitamin D three times a day.  4. Wellbutrin SR 150 mg b.i.d.  5. Depakote 1 gram daily.  6. Nu-Iron 150 mg b.i.d.  7. Omeprazole 20 mg b.i.d.  8. Risperdal 6 mg nightly  9. Trazodone 100 mg nightly.  10.Lipitor 10 mg nightly.  11.Zyrtec 10 mg daily.  12.Combivent 4 times a  day.  13.Albuterol inhaler as needed.  14.Darvocet as needed   DISPOSITION:  He has followup appointment to see Dr. Tamsen Snider on  October 11, 2007.  We will arrange for him to receive Caren Griffins in hopes of  getting his Crohn's disease in complete remission.   DISCHARGE LABORATORY DATA:  White count 9.9, hemoglobin 9.9, platelets  356. BUN 2, creatinine 0.85.   cc:  FAX TO DR. Marval Regal.      Caro Hight, M.D.  Electronically Signed     SM/MEDQ  D:  09/19/2007  T:  09/19/2007  Job:  [id number]

## 2011-02-16 NOTE — Consult Note (Signed)
NAMEMARQUAVIOUS, NAZAR NO.:  [id number]   MEDICAL RECORD NO.:  [id number]          PATIENT TYPE:  INP   LOCATION:  IC09                          FACILITY:  [facility]   PHYSICIAN:  Caro Hight, M.D.      DATE OF BIRTH:  06/25/1966   DATE OF CONSULTATION:  04/06/2008  DATE OF DISCHARGE:                                 CONSULTATION   REFERRING PHYSICIAN:  Bonnielee Haff, MD   REASON FOR CONSULTATION:  Rectal bleeding.   HISTORY OF PRESENT ILLNESS:  Mr. Schwandt is a 45 year old male who  presents with a diagnosis of Crohn disease in 2003.  His symptoms have  been uncontrolled with multiple immunosuppressants.  He was seen and  evaluated approximately 2-3 weeks ago at [facility] and colonoscopy was performed, which revealed  inflammation in the ileum and the right colon.  His Cimzia was  discontinued and he was also taken off the steroids.  He reports that he  has been having 5-10 bowel movements, sometimes more, per day for the  last 3 weeks.  He was only seeing a small amount of blood.  Yesterday  around 5 a.m. he developed more profuse rectal bleeding.  His girlfriend  called the ambulance and brought him to the emergency department.  He  complains of some mild periumbilical pain as well as bilateral flank  pain.  He vomited yesterday.  No blood.  He denies any nausea.  He  denies any heartburn or indigestion.  His appetite has been good.  He  denies any fever, chest pain, shortness of breath or swelling in his  left arm.  He has a mild dry cough.   PAST MEDICAL HISTORY:  1. Nystagmus since birth.  2. Hyperlipidemia.  3. Asthma.  4. GERD.  5. Bipolar disorder.  6. Insomnia.  7. Enterococcus UTI in April 2009.   PAST SURGICAL HISTORY:  1. Right inguinal hernia repair.  2. 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   ALLERGIES:  No known drug allergies.   MEDICATIONS:  1. Imuran  150 mg daily.  2. Wellbutrin.  3. Depakote.  4. Solu-Cortef 100 mg IV q.8 h.  5. Protonix 40 mg IV daily at 10 p.m.  6. Risperdal 8 mg p.o. nightly.  7. Zocor.  8. Zofran 4 mg IV every hours as needed for nausea and vomiting.  9. Oxycodone 5 mg every 4 hours as needed for pain.  10.Phenergan 12.5 mg IV every 4 hours as needed for nausea or      vomiting.   FAMILY HISTORY:  No family history of colon cancer or colon polyps.   SOCIAL HISTORY:  He lives at home with his girlfriend Rosiland Oz.  Her  phone number is [phone].  He does not use alcohol or recreational  drugs.   REVIEW OF SYSTEMS:  Per the HPI, otherwise all systems are negative.   PHYSICAL EXAMINATION:  Afebrile, systolic blood pressure 99 to 113,  heart rate 117 to 103.  GENERAL:  He  is in no apparent distress, alert and oriented x4.  HEENT:  Atraumatic, normocephalic.  Pupils equal, reactive to light, nystagmus,  no oral lesions.  Posterior pharynx without erythema or exudate.  NECK:  Full range of motion and no lymphadenopathy.  LUNGS:  Clear to  auscultation bilaterally.  CARDIOVASCULAR:  A regular rhythm, no murmur,  normal S1 and S2.  ABDOMEN:  Bowel sounds are present, soft, nontender,  nondistended, no rebound or guarding.  EXTREMITIES:  Bilateral  compression devices in place, no edema.  No cyanosis.  His left upper  extremity has no edema.SKIN:  He has a maculopapular rash with central  clearing on the right forearm, ulnar aspect.   LABORATORY DATA:  Initial hemoglobin 10.6 and decreased to 9.7.  He  received 1 unit packed red blood cells and his hemoglobin is now 11.6 to  11.1.  White count 5.3, platelets 102.  INR 1.5.  BUN 5, creatinine  0.81, albumin 1.7.   ASSESSMENT:  Mr. Letizia is a 45 year old male with Crohn disease which  is not able to be medically managed.  His diarrhea has improved and he  has had no rectal bleeding since admission.  His hemoglobin is stable.  He needs a total colectomy  with ileostomy at [facility].  Thank you for allowing me to see Mr. Koskinen in  consultation.  My recommendations follow.   RECOMMENDATIONS:  1. Agree with holding anticoagulation at this point.  The risks of      bleeding outweigh the benefits of preventing a PE.  2. Continue Imuran.  3. Continue Protonix for GI prophylaxis.  4. Agree with transfer to [facility] as soon as possible so that he should have definitive      therapy for his Crohn disease.      Caro Hight, M.D.  Electronically Signed     SM/MEDQ  D:  04/06/2008  T:  04/06/2008  Job:  [id number]   cc:   Marval Regal, MD

## 2011-02-16 NOTE — Consult Note (Signed)
[name], [name]                ACCOUNT NO.:  [id number]   MEDICAL RECORD NO.:  [id number]          PATIENT TYPE:  INP   LOCATION:  A201                          FACILITY:  [facility]   PHYSICIAN:  Gaston Islam. Tressie Stalker, MD  DATE OF BIRTH:  1965/10/29   DATE OF CONSULTATION:  02/02/2008  DATE OF DISCHARGE:                                 CONSULTATION   PRIMARY CARE PHYSICIAN:  Anselmo Pickler, DO   DIAGNOSES:  1. Deep venous thrombosis of the left arm extending into the left neck      as well as early extension to the superior vena cava with bilateral      pulmonary emboli shortly after insertion of a Port-A-Cath in the      left upper chest on January 22, 2008.  2. Crohn disease times many years.  3. Nystagmus since birth.  4. Bipolar disorder.  5. History of gastroesophageal reflux disease.  6. Hyperlipidemia.  7. History of smoking in the past, though he states he has not done      that for years.  8. History of asthma.  9. History of appendectomy in 2005.  10.History of subcutaneous pancreatitis in 2005.  11.Right inguinal hernia repair.  12.Right hip replacement due to avascular necrosis, steroid therapy.  13.Probable candidiasis of the groin bilaterally, though he thinks it      might be related to one of his medications for Crohn's.   HISTORY:  This is a pleasant 45 year old gentleman, Caucasian, who had a  port placed for venous access on the above-mentioned date and developed  chronic progressive swelling of the left arm, then left neck, and some  discomfort.  He was admitted here on January 31, 2008, found to have the  above-mentioned findings on a CT scan that was actually done on January 31, 2008.   His lab results on admission showed a D-dimer that was elevated at 0.97.  PT 14.4 seconds.  PTT 53 seconds, though it is not clear whether that  was after a dose of heparin.  He had a Hemoccult that was positive on  the same day.  Hemoglobin on admission of 8.8, white count  13,100,  platelets 239,000, and a left shift.   BMET on February 01, 2008, showed a BUN and creatinine that were normal.  Potassium was elevated at 3.2.   He has not had a CMET that I can find since at least this admission.  He  had a hepatic function panel on November 15, 2007, that was perfectly  normal except for a low total protein and low total albumin.  It is of  interest that his ferritin was 7 on November 15, 2007, consistent with  absolute iron deficiency.   His vital signs since being here shows that he has been afebrile.  He  denies any chest pain.  He denies any shortness of breath at this time.  He does have an IV in the right arm, which is not swollen.   FAMILY HISTORY:  He states that his mother had blood clots in her arms  and legs.  No other siblings have had it.  He has 3 siblings, they have  not had it.  No clotting on his father's side of the family.  Only his  mother had the clots that he is aware of.  She died of a cardiac illness  at age 9.   He used to smoke.  He has poor teeth, has not been to a dentist in a  long time.  He lives by himself in an apartment.   His medications here include,  1. Imuran 150 mg a day.  2. Cogentin 1 mg a day.  3. Wellbutrin 150 mg a day.  4. Depakote 1500 mg at night.  5. He takes calcium carbonate with vitamin D twice a day.  6. He is on Lovenox right now at 75 mg subcutaneously every 12 hours.  7. Lasix is 40 mg once a day.  8. He is also on Levaquin 250 mg once a day.  9. He is on an iron pill 150 mg twice a day.  10.He is also on Flagyl 500 mg twice a day.  11.Claritin 10 mg once a day.  12.Protonix 40 mg once a day.  13.He is also on KCl 20 mEq once a day.  14.Prednisone 30 mg once a day.  15.Risperdal 8 mg, he takes at nighttime.  16.Zocor 20 mg once a day.  17.Trazodone 200 mg at night.  18.He is also on Phenergan on a p.r.n. basis.  19.Propoxyphene/APAP on a p.r.n. basis.   He is not aware of any allergies that  he is aware of.   On exam, he is a pleasant gentleman, in no acute distress.  Again, he is  afebrile.  His skin is warm and dry to touch.  Pulse rate around 100 and  regular, respirations 18 and unlabored, blood pressure is about 100/62.  Lymph nodes are negative throughout.  He does have a swollen left arm,  swollen left neck, and it is tender.  There is a cord on the left neck.  I did not detect any adenopathy.  He has a rash in his inguinal and  pubic area consistent with candidiasis in my opinion.  There is no  hepatosplenomegaly.  Bowel sounds are very, very active.  He has a  somewhat distended tummy from I think being mildly obese.  He does not  have any leg edema or tenderness of his calves whatsoever.  Pulses in  his feet are 1 to 2+ and symmetric.  He is right-handed.  The right arm  is not swollen.  His lungs are clear to auscultation and percussion.  I  did not hear any rales or rubs.  His heart shows a regular rhythm and  rate at this time.  There is no S3 gallop and I did not detect a murmur.   He has poor teeth, many are missing.  Tongue is unremarkable.  He has  nystagmus, which is quite profound.   This young gentleman has had his port removed and the wound site in left  upper chest wall is insignificant at this time.  There is no evidence  for infection or separation.   He has DVT and he has a family history interestingly of DVT.  So, I  think we need to consider treating him for 3-6 months now that the  offending catheter has been removed, and after that is done, I suspect  he will get 6 months of therapy.  He needs to have a hypercoagulable  panel done.   Since he has very poor venous access, he may need at some point another  catheter to be inserted, so we need to work this out as to whether he is  hypercoagulable or not, certainly this foreign body could have  instituted this clotting at this time without any other provocation.  That remains to be  seen.      Gaston Islam. Tressie Stalker, MD  Electronically Signed     ESN/MEDQ  D:  02/02/2008  T:  02/03/2008  Job:  [id number]   cc:   Caro Hight, M.D.  [address]  [place] , [place] [id number]

## 2011-02-16 NOTE — Discharge Summary (Signed)
NAMEASPEN, DETERDING                ACCOUNT NO.:  [id number]   MEDICAL RECORD NO.:  [id number]          PATIENT TYPE:  INP   LOCATION:  V291                          FACILITY:  [facility]   PHYSICIAN:  Audria Nine, M.D.DATE OF BIRTH:  [birth year]   DATE OF ADMISSION:  03/04/2008  DATE OF DISCHARGE:  06/04/2009LH                               DISCHARGE SUMMARY   DISCHARGE DIAGNOSES:  1. Left upper extremity deep venous thrombosis.  2. Recent history of deep venous thrombosis involving the left      internal jugular vein.  3. Recent pulmonary embolus.   DISCHARGE MEDICATIONS:  1. Coumadin, dosing per Coumadin clinic.  2. Azathioprine 100 mg p.o. once a day.  3. Bupropion 150 mg p.o. once a day.  4. Omeprazole 20 mg p.o. once a day.  5. Zyrtec 10 mg p.o. once a day.  6. Calcium Plus 600 mg p.o. once a day.  7. Purely iron oral 150 mg p.o. once a day.  8. Lasix  40 mg p.o. once a day.  9. Risperdal 4 mg p.o. once a day.  10.Depakote ER 1500 mg p.o. once a day.  11.Lipitor 10 mg p.o. daily.  12.Trazodone 100 mg p.o. daily.   FOLLOWUP:  1. Patient is to follow up with Coumadin clinic as scheduled.  2. With her primary care physician in the next two to three weeks.   PERTINENT LABORATORY DATA ON ADMISSION:  PT/INR was noted to be  subtherapeutic.  However, subsequently his INR was 1.9 and was  therapeutic, for which he was discharged.  His white blood cell count  was 3, hemoglobin of 8.8, hematocrit 25.1, platelet count was 173 with  no left shift.  Sodium 139, potassium was 2.3 - this was corrected - CO2  was 29, glucose 76, BUN of 2, creatinine of 0.76, AST was 74, ALT of 42.   HOSPITAL COURSE:  Patient was admitted on March 04, 2008, due to swelling  on the left upper arm.  Patient is a 45 year old male, who is well-known  to the hospitalist service from multiple admissions.  Patient has  multiple medical problems.  He had a deep venous thrombosis of the left  jugular vein as  recently as three weeks ago.  The patient now presented  to the emergency room with worsening of that left arm.  He denied any  shortness of breath.  I was concerned that the patient had a recurrence  of his deep venous thrombosis.  This prompted further evaluation and his  PT and INR were noted to be subtherapeutic.   Patient was subsequently started on Lovenox to bridge with the Coumadin.   During the course of hospitalization, the patient's diarrhea from his  Crohn's disease was stable and he had very minimal abdominal pain.   Patient was subsequently discharged home, once his INR was therapeutic.   DISPOSITION:  To home.   FOLLOWUP:  PSA, as mentioned above.      Audria Nine, M.D.  Electronically Signed     AM/MEDQ  D:  04/24/2008  T:  04/24/2008  Job:  [id number]

## 2011-02-16 NOTE — H&P (Signed)
[name], [name]                ACCOUNT NO.:  [id number]   MEDICAL RECORD NO.:  [id number]          PATIENT TYPE:  INP   LOCATION:  Y782                          FACILITY:  [facility]   PHYSICIAN:  Audria Nine, M.D.DATE OF BIRTH:  27-Apr-1966   DATE OF ADMISSION:  03/04/2008  DATE OF DISCHARGE:  LH                              HISTORY & PHYSICAL   ADMISSION DIAGNOSES:  1. Left upper extremity deep venous thrombosis.  2. Recent history of deep venous thrombosis involving the left      internal jugular vein.  3. History of a recent pulmonary embolus.   CHIEF COMPLAINT:  Swelling of the left upper arm.   HISTORY OF PRESENT ILLNESS:  [name] is a 45 year old male who is  well known to the hospitalist service from a recent admission.  The  patient has multiple medical problems, including hyperlipidemia,  hypertension, nystagmus at birth, chronic Crohn's disease, deep venous  thrombosis in the left jugular vein as recently as three weeks ago.  Patient presented again to the emergency room today with worsening  swelling on that left arm.  He denies any shortness of breath.  However,  was concerned that this may represent recurrence of his deep venous  thrombosis.  A PT/INR obtained showed that patient was subtherapeutic.  As a result, the patient is now being admitted for further evaluation.  Patient describes very minimal pain, about 2/10 in that left arm with no  radiation.  He denies any fevers or chills.   REVIEW OF SYSTEMS:  A 10-point review of systems is otherwise negative  except as mentioned in the history of present illness.   PAST MEDICAL HISTORY:  1. Anemia.  2. Arthritis.  3. Bipolar disorder.  4. Chronic Crohn's disease.  5. Deep venous thrombosis.  6. Dyslipidemia.  7. Bilateral pulmonary embolus.   PAST SURGICAL HISTORY:  1. Orthopedic procedure.  2. Kidney operation.  3. Brain operations.   SOCIAL HISTORY:  Patient is a nonsmoker.  Does not drink  alcohol.  No  history of illicit drug abuse.   SPECIAL NEEDS:  Patient is visually impaired.   FAMILY HISTORY:  Noncontributory.   ALLERGIES:  Patient is allergic to multiple medications, including  ASPIRIN, CODEINE, PENICILLIN, HYDROMORPHONE, ULTRAM, which causes  localized swelling.   MEDICATIONS:  1. Azathioprine 100 mg p.o. once daily.  2. Bupropion 150 mg p.o. once daily.  3. Omeprazole 20 mg p.o. once daily.  4. Zyrtec 10 mg p.o. once daily.  5. Calcium plus 600 mg p.o. once daily.  6. PolyIron oral 150 mg p.o. once daily.  7. Lasix 40 mg p.o. once daily.  8. Risperdal 4 mg p.o. once daily.  9. Depakote ER 1500 mg p.o. once daily.  10.Lipitor 10 mg p.o. once daily.  11.Trazodone 100 mg p.o. daily.   PHYSICAL EXAMINATION:  He is conscious, alert, comfortable.  Does have  some nystagmus, which is not new.  VITAL SIGNS:  Blood pressure 128/78 with a pulse of 112, respirations  20, temperature 98.6 degrees Fahrenheit.  Oxygen saturation 98% on room  air.  HEENT:  Normocephalic and atraumatic.  Oral mucosa is moist with no  exudates.  NECK:  Supple.  No JVD or lymphadenopathy.  LUNGS:  Reduced air entry bilaterally.  HEART:  S1 and S2.  Regular rate and rhythm.  No S3, S4, gallops, or  rubs.  ABDOMEN:  Soft, nontender.  Bowel sounds positive.  No masses palpable.  EXTREMITIES:  Patient has swelling in the left upper extremity, which is  slightly tender and slightly erythematous, which is very consistent with  deep venous thrombosis.   ASSESSMENT:  1. 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  2. History of recent history of bilateral pulmonary infarct.  3. Crohn's disease.  4. History of anemia of chronic disease.  5. History of dyslipidemia.   PLAN:  1. Patient will be admitted to the medical floor for further      evaluation and management.  2. I would start the patient on Lovenox 1 mg per kg subcu twice daily      and increase his Coumadin to 7.5 mg  p.o.      once daily until his INR becomes therapeutic.  3. I will resume all of his other home medications at this time.   I have discussed the above plan with him, and he verbalized full  understanding.  We will also correct his potassium.      Audria Nine, M.D.  Electronically Signed     AM/MEDQ  D:  03/05/2008  T:  03/05/2008  Job:  [id number]

## 2011-02-16 NOTE — Assessment & Plan Note (Signed)
NAMEMarland Kitchen  [name], [name]                 CHART#:  [id number]   DATE:  06/13/2008                       DOB:  [birth year]   REFERRING PHYSICIAN:  Manual Meier. Bridget Hartshorn, MD.   PROBLEM LIST:  1. Crohn's colitis, status post total colectomy with ileostomy in      August 2009.  2. History of left upper extremity deep venous thrombosis associated      with catheter placement.  3. Nystagmus.  4. Hyperlipidemia.  5. Bipolar disorder.  6. Asthma.  7. Gastroesophageal reflux disease.  8. Insomnia.  9. Enterococcus urinary tract infection in April 2009.  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   SUBJECTIVE:  The patient is a 45 year old male who presents as a return  patient visit.  He was last seen as an inpatient when he was transferred  to [facility] for GI bleed.  He  states he had a Port-A-Cath placed on the right and he had a surgery in  July 2009.  He has got follow up appointment with Dr. Renee Harder in  September 2009.  He has been doing very well.  It is the best he has  felt in 4 years.  He is off his prednisone.  He has just been watching  football.  He is sleeping through the night.  He empities his bag 3  times a day.  He denies any chest pain or shortness of breath.   MEDICATIONS:  1. Combivent.  2. Albuterol.  3. Bupropion.  4. Omeprazole.  5. Depakote.  6. Risperdal.  7. Trazodone.  8. Lipitor.  9. Zyrtec.  10.Iron.  11.Potassium  12.Calcium.  13.Lasix.  14.Benztropine.  15.Hycodan.  16.Coumadin.   OBJECTIVE:  Physical exam:  VITAL SIGNS:  Weight 154 pounds (down 25 pounds since January 2009),  height 5 feet 6 inches, BMI 24.9 (healthy), temperature 98, blood  pressure 198/64, and pulse 60.GENERAL:  He is no apparent distress.  Alert and oriented x4.  LUNGS:  Clear to auscultation bilaterally.CARDIOVASCULAR:  Regular  rhythm.  No murmurs.ABDOMEN:  Bowel sounds are present.  Soft,  nontender,  and nondistended with midline incision, which is well  healed.SKIN:  He still has a rash on his right upper extremity.   ASSESSMENT:  The patient is a 45 year old male with Crohn's colitis,  status post total colectomy and is doing very well.  He has no evidence  of active disease.  Thank you for allowing me to see the patient in  consultation.  My recommendations follow.   RECOMMENDATIONS:  1. Will obtain records from [facility].  2. Follow up appointment in 3 months.       Caro Hight, M.D.  Electronically Signed     SM/MEDQ  D:  06/13/2008  T:  06/14/2008  Job:  [id number] [id number]

## 2011-02-16 NOTE — Op Note (Signed)
NAMESUNDEEP, DESTIN NO.:  [id number]   MEDICAL RECORD NO.:  [id number]          PATIENT TYPE:  INP   LOCATION:  A201                          FACILITY:  [facility]   PHYSICIAN:  Jamesetta So, M.D.  DATE OF BIRTH:  [birth year]   DATE OF PROCEDURE:  02/02/2008  DATE OF DISCHARGE:                               OPERATIVE REPORT   PREOPERATIVE DIAGNOSES:  Venous thrombosis, Crohn's disease.   POSTOPERATIVE DIAGNOSES:  Venous thrombosis, Crohn's disease.   PROCEDURE:  Port-A-Cath removal.   SURGEON:  Jamesetta So, MD.   ANESTHESIA:  Local.   INDICATIONS:  The patient is a 45 year old white male who had a port  placed on January 22, 2008, who now presented with significant swelling of  the left arm.  Venous Doppler revealed significant thrombosis of the  left axillary vein as well as internal jugular vein.  Due to the  thrombosis, the patient now comes to the minor procedure room for Port-A-  Cath removal.  The risks and benefits of the procedure were fully  explained to the patient, gave informed consent.   PROCEDURE NOTE:  The patient was placed in the supine position.  The  left upper chest was prepped and draped using the usual sterile  technique with Betadine.  Surgical site confirmation was performed.  Xylocaine 1% was used for local anesthesia.   An incision was made through the previous Port-A-Cath incision site.  This was taken down to the port.  The port was removed without  difficulty.  No active bleeding was noted.  The subcutaneous layer was  reapproximated using a 4-0 Vicryl interrupted suture.  The skin was  closed using 4-0 Vicryl subcuticular suture.  Dermabond was then  applied.   All tape and needle counts were correct at the end of the procedure.  The patient was transferred back to his hospital room in stable  condition.   COMPLICATIONS:  None.   SPECIMEN:  None.   BLOOD LOSS:  Minimal.      Jamesetta So, M.D.  Electronically  Signed     MAJ/MEDQ  D:  02/02/2008  T:  02/03/2008  Job:  [id number]   cc:   Caro Hight, M.D.  [address]  [place] , [place] [address]

## 2011-02-16 NOTE — Assessment & Plan Note (Signed)
NAMEMarland Kitchen  [name], [name]                 CHART#:  [id number]   DATE:  01/11/2008                       DOB:  [birth year]   CHIEF COMPLAINT:  Follow up of Crohn disease, rectal bleeding this  morning, weak, and dizzy.   SUBJECTIVE:  The patient is here for a follow up visit.  He was last  seen during his hospitalization in February.  He has been in constant  correspondence with [place], however, given his ongoing problems with Crohn  disease.  He is on Cimzia.  He is at a point of receiving injections  every 4 weeks.  His last one was on Monday.  His last labs were on  Wednesday and we noted that his hemoglobin had dropped from 11.8 down to  10.3.  White count was normal at 10,800.  He spoke with Dr. Stann Mainland on the  31st of March and apparently had been seeing blood with every bowel  movement 2 to 3 times a day.  He had just come off of his prednisone.  She resumed his prednisone, as well as gave him some Flagyl and  increased the Imuran.  He presents today stating that he has been over a  week without any blood in the stool up until this morning.  He is still  having 4 or 5 stools a day and mostly at nighttime.  He continues to  have chronic mid abdominal cramping-type pain which is stable.  He noted  blood on the tissue this morning, a little bit mixed in the stool.  He  says he is on 20 mg of prednisone at this time.  His weight is down 4  pounds since we last saw him.  He is currently on Imuran 100 mg daily.  He is taking iron supplements but he is not sure whether it is ferrous  sulfate or Nu-Iron.  He did not bring his med list today.  He did not  mention being on Flagyl and I need to verify how long of a course was  given to him.  He says for a few days he has felt a little dizzy when he  gets up.  It usually resolves with a couple of minutes.  He has felt a  little weak but says that it really has not been that bad up until the  last day.  He is eating and drinking well.  He says he is  drinking at  least 8 to 10 glasses of water a day.   CURRENT MEDICATIONS:  See updated last although may not be complete as  he did not bring his list.   ALLERGIES:  1. PENICILLIN.  2. CODEINE.  3. ASPIRIN.  4. IBUPROFEN.   PHYSICAL EXAM:  Weight 173, down 4 pounds.  Temp 97.9.  Blood pressure  92/64.  Pulse 88.  He had orthostatic blood pressures done.  Sitting his  blood pressure was 100/74, pulse 68, lying his blood pressure was  100/72, pulse 80, and standing his blood pressure was 90/70 and pulse  88.  GENERAL:  Pleasant, slightly pale-appearing Caucasian male in no  acute distress.  He appears very comfortable.  He smiles and laughs a  lot.  SKIN:  Warm and dry.  No jaundice. HEENT:  Sclerae are nonicteric.  Oropharyngeal mucosa moist and pink.  ABDOMEN:  Positive bowel sounds.  Abdomen soft.  He has mild tenderness throughout the mid abdomen to deep  palpation.  No rebound or guarding.  No abdominal bruits or hernias.  LOWER EXTREMITIES:  No edema.   IMPRESSION:  The patient is a 45 year old gentleman with Crohn's who has  been very difficult to achieve remission.  He is currently on Imuran,  prednisone, Cimzia, and Flagyl.  He had been feeling better for about a  week but has started seen blood again this morning.  His last Cimzia  injection was 4 days ago.  He states usually after his Cimzia he starts  to feel better up until right before his next shot.  He had a slight  drop in his hemoglobin down to 10 range.  I have discussed case with Dr.  Stann Mainland.  Really, he does not have evidence today of orthostatic  hypotension.  He seems to be well hydrated.  We will continue to monitor  him for now with no change in his therapy.  We will recheck his  hemoglobin next week and I am going to recheck his CMET as well.  He is  instructed to sit in a chair or on the bedside before standing to try to  let his blood pressure equilibrate.  If he notes increased bleeding or  persistent  dizziness, he should let us know or go to the emergency  department.   PLAN:  CBC and CMET next week.  Other recommendations as noted above.  He should have an office visit coming up next month, would like him to  keep that.       Neil Crouch, P.A.  Electronically Signed     Caro Hight, M.D.  Electronically Signed    LL/MEDQ  D:  01/11/2008  T:  01/11/2008  Job:  [id number]   cc:   Marval Regal, M.D.

## 2011-02-16 NOTE — Assessment & Plan Note (Signed)
NAMEMarland Kitchen  MILBERT, BIXLER                 CHART#:  [id number]   DATE:  10/10/2007                       DOB:  [birth year]   REFERRING PHYSICIAN:  Dr. Marval Regal.   PROBLEM LIST:  1. Partially controlled chron's colitis on Imuran and prednisone.  2. Bipolar disorder.  3. Arthritis.  4. History of avascular necrosis.  5. Hyperlipidemia.  6. Asthma.  7. Gastroesophageal Reflux Disease.   SUBJECTIVE:  Mr. Hollern is a 45 year old male who has had difficulty  controlling Crohn's colitis.  He was admitted in 09/2007 complaining  bloody diarrhea and coffee ground emesis.  His upper endoscopy showed no  etiology for his coffee ground emesis.  He was started on prednisone at  40 mg daily.  His abdominal pain, vomiting and diarrhea improved and he  was discharged to home on a low residue diet.  The paperwork was  completed for him to begin cimzia.  The Cimzia has not been administered  due to an error in his zip code on the application form.  He has  received the medications.  His prednisone is down to 30 mg and he is  currently on 75 mg of Imuran daily.  He saw a small amount of blood for  the first time today. He continues to have stomach cramps.  He is taking  his iron twice a day.  His bowel movements primarily occur at night  time.  He usually has 7-8 bowel movements at night.  There are small  amounts of stool but he is awakened to have bowel movements.  His  appetite has been good.  He denies any vomiting, use of milk products or  nuts.  He is taking his calcium.   SOCIAL HISTORY:  He grew up in [place], [place].  His girlfriend is 76 years old and has heart problems.   MEDICATIONS:  1. Bupropion.  2. Omeprazole 20 mg 1-2 x day.  3. Depakote.  4. Risperdal.  5. Trazodone.  6. Lipitor.  7. New iron 150 mg twice a day.  8. Calcium with vitamin D 2 in the morning and 1 at night.  9. Isothiazine 75 mg a daily.  10.Prednisone 30 mg daily.   OBJECTIVE:  VITALS:  Weight 179 pounds, (down 4 pounds since 08/2007),  height 5 feet 6 inches, BMI 27.4 (overweight).  Temperature 91, blood  pressure 104/80, pulse 60.  GENERAL:  He is in no apparent distress.  Alert and oriented x4.  HEENT:  His mouth has no oral lesions.  LUNGS:  Clear to auscultation bilaterally.  CARDIOVASCULAR EXAM:  Regular rate and rhythm, no murmur, normal S1/ S2.  ABDOMEN:  Bowel sounds are present, soft, mild tenderness to palpation  in the right upper and lower quadrant, mild to moderate tenderness to  palpation in the left upper quadrant, moderate tenderness to palpation  in the left lower quadrant.  He has no rebound or guarding.  NEURO:  He has no new focal neurologic deficits.   ASSESSMENT:  Mr. Crilly is a 45 year old male who has Crohn's colitis  which is not ideally controlled.  He continues to have abdominal pain,  diarrhea and intermittent bloody stools.  His Darvocet did not help with  his pain.  Thank you for allowing me to see Mr. Servellon in consultation.  My recommendations follow.   RECOMMENDATIONS:  1. Will check CBC and a diff today as well as hepatic function panel.  2. He is instructed to increase his prednisone to 40 mg daily and his      Imuran to 100 mg daily.  3. He is given his discharge instructions in writing.  4. I did call [facility] at [phone] to address the zip code issue and      spent 10 minutes on the telephone trying to resolve the issue.      They confirmed that they will be sending out a nurse this week to      initiate his first dose.  The cimzia has been prescribed because      Mr. Stefanko has a significant challenges to getting transportation      to the doctor's office. His first dose will also be administered at      home because of his transportation issues.  5. He has a return patient visit in 6 weeks and will need repeat CBC      with diff at that time.  Will await the nursing assessment from      [facility] to  determine whether he needs subsequent doses of cimzia      beyond 0, 2 and 4 weeks.       Caro Hight, M.D.  Electronically Signed     SM/MEDQ  D:  10/10/2007  T:  10/11/2007  Job:  [id number]

## 2011-02-16 NOTE — Consult Note (Signed)
[name], [name]                ACCOUNT NO.:  [id number]   MEDICAL RECORD NO.:  [id number]          PATIENT TYPE:  INP   LOCATION:  A204                          FACILITY:  [facility]   PHYSICIAN:  R. Garfield Cornea, M.D. DATE OF BIRTH:  [birth year]   DATE OF CONSULTATION:  09/15/2007  DATE OF DISCHARGE:                                 CONSULTATION   ADDENDUM:  I reviewed the patient's office records.  She called  yesterday with complaints of hematemesis.  The patient did not give me  that information today during his interview/consultation.  I spoke with  the patient a few minutes ago, and he confirms that yesterday he vomited  fresh blood.  He has also been having some very dark stools over the  course of the last couple of days.  Reviewing his outpatient records,  his hemoglobin was 11.4 on August 29, 2007, and was down to 8.9  yesterday.  He was transfused back in September when his hemoglobin  dropped down to 7.8.  his MCV was 65.9 at that time.  He received 2  units of packed red blood cells.  His hemoglobin has steadily increased  over the last several months up to 11.4 as of August 29, 2007.  His  MCV at that time was 84.6.  Given these findings, the patient will need  to have an upper endoscopy to evaluate upper GI bleed.   Recommend serial H&H off another hemoglobin check as planned at noon.  We will transfuse as needed.      Neil Crouch, P.ABridgette Habermann, M.D.  Electronically Signed    LL/MEDQ  D:  09/15/2007  T:  09/15/2007  Job:  [id number]   cc:   Marval Regal, MD   [name], DO

## 2011-02-16 NOTE — Discharge Summary (Signed)
[name], [name] NO.:  [id number]   MEDICAL RECORD NO.:  [id number]          PATIENT TYPE:  INP   LOCATION:  A336                          FACILITY:  [facility]   PHYSICIAN:  [name], DO    DATE OF BIRTH:  16-Jun-1966   DATE OF ADMISSION:  11/23/2007  DATE OF DISCHARGE:  02/24/2009LH                               DISCHARGE SUMMARY   DISCHARGE DIAGNOSES:  1. Crohn's disease acute flare.  2. History of bipolar disorder.  3. Arthritis.  4. History of avascular necrosis.  5. Hyperlipidemia.  6. Asthma.  7. History of gastroesophageal reflux disease.   BRIEF HOSPITAL COURSE:  This is a 45 year old Caucasian male well known  to our service with history of Crohn's colitis.  The patient presents  having abdominal pain, severe left rib pain, and left shoulder pain.  The patient's history of this Crohn's has been very difficult to treat  by gastroenterology.  He is seen by gastroenterology on a regular basis.  The patient stated the night prior to being admitted, he had a bad  episode of left rib and abdominal pain, called EMS.  He was seen.  CT  scan performed showed possible transverse colon, definitely descending  colon, sigmoid colon, rectal inflammation, and the patient states he was  having slight blood in his stool.  He was seen and admitted.  GI was  consulted. The patient was started on IV antibiotics as well as IV  steroids.  His diet was advanced to a low-residue diet.  He was placed  on proton pump inhibitor twice daily.   The patient's abdominal pain has improved with IV pain medication over  the last few days.  His pain medication was switched to p.o.  The  patient has been tolerating p.o. medications on a daily basis.  At this  time, we feel the patient is stable to be discharged to home.   DISCHARGE MEDICATIONS:  1. Bupropion 150 mg daily.  2. Omeprazole 20 mg twice a day.  3. Zyrtec 10 mg daily.  4. Calcium 60 mg one in the morning, one at  night.  5. Azathioprine 50 mg 1-1/2 tablets daily.  6. Poly-Iron 150 mg one in the morning, one at night.  7. Furosemide 40 mg daily.  8. Depakote 500 mg two at bedtime.  9. Risperdal 4 mg two at bedtime.  10.Lipitor 10 mg at bedtime.  11.Potassium chloride 20 mEq daily.  12.Trazodone 100 mg two at night, then as needed.  13.Albuterol as needed.  14.Benzatropine 1 mg daily.  15.Flagyl 500 mg three times a day for 7 days.  16.Prednisone 40 mg p.o. daily.   VITAL SIGNS ON DISCHARGE:  Temperature 97.2, pulse 91, respirations 20,  blood pressure 122/77, saturation 97% on room air.   LABORATORY DATA ON DISCHARGE:  White count 14,000, hemoglobin 10.4,  hematocrit 32.7, platelet count 327.  C. difficile was negative.   CONDITION ON DISCHARGE:  Stable.   DISPOSITION:  Patient discharged to home.   CONSULTANTS:  Gastroenterology.   DISCHARGE INSTRUCTIONS:  1. The patient is to follow up  with his primary care physician in the      next 1-2 weeks.  2. The patient is to follow up with gastroenterology at next scheduled      appointment.  3. The patient to maintain low-salt, heart-healthy diet.  4. Increase activity slowly.  5. The patient is to return to the emergency room if having severe      abdominal pain or problems.      [name], DO  Electronically Signed     SM/MEDQ  D:  11/28/2007  T:  11/29/2007  Job:  [id number]   cc:   Dr. Earl Lites, M.D.  [address]  [place] , [place] [address]

## 2011-02-16 NOTE — Group Therapy Note (Signed)
[name], [name]                ACCOUNT NO.:  [id number]   MEDICAL RECORD NO.:  [id number]          PATIENT TYPE:  INP   LOCATION:  A201                          FACILITY:  [facility]   PHYSICIAN:  Edward L. Luan Pulling, M.D.DATE OF BIRTH:  04/15/1966   DATE OF PROCEDURE:  02/05/2008  DATE OF DISCHARGE:                                 PROGRESS NOTE   [name] says he is feeling a little better.  He has a midline  catheter or a PICC line, I cannot tell for sure which one he has, and  seems to be doing better.   His exam today shows a temperature 98.1, pulse 86, respirations 20,  blood pressure 111/69, and O2 sat is 93% on room air.  His arm seems a  bit less swollen.  He is not having any new complaints.  His chest is  pretty clear.  His heart is regular.   His white blood count this morning is 9000, his hemoglobin is 10.5, and  platelets 344.  Pro time 12.6 and INR 0.9.   ASSESSMENT:  He has a clot in his left subclavian vein.  He did have a  Port-A-Cath in that area and that has been removed.  He now has a  peripherally inserted central catheter line.  His arm is still swollen.  He is not fully anticoagulated.  I do not plan to change anything else  and will follow.      Edward L. Luan Pulling, M.D.  Electronically Signed     ELH/MEDQ  D:  02/05/2008  T:  02/05/2008  Job:  [id number]

## 2011-02-16 NOTE — Assessment & Plan Note (Signed)
NAMEMarland Lane  ANASTASIOS, MELANDER                 CHART#:  [id number]   DATE:  02/19/2008                       DOB:  01-08-1966   PROBLEM LIST:  1. Crohn's colitis partially controlled.  2. Right upper extremity DVT in the presence of a Port-A-Cath placed      April 2009 and needed to be removed in May 2009 and now requires      Coumadin therapy.  3. Nystagmus.  4. Bipolar disorder.  5. Gastroesophageal reflux disease.  6. Hyperlipidemia.  7. Asthma.  8. Appendectomy 2005.  9. Pancreatitis in 2005.  10.Right inguinal hernia repair.  11.Right hip replacement due to avascular necrosis with steroid      therapy.  12.Rash on his right arm.  13.Onychomycosis of the thumb and toe.   SUBJECTIVE:  [name] is a 45 year old male who was recently  discharged after being admitted for right upper extremity DVTafter  portacath placement complicated by PE.  He is currently maintained on  Coumadin.  He has no one that is following his therapy at this point.  He prefers to follow with Dr. Tressie Stalker.  He complains no diarrhea  during the day and only at nighttime.  He will be decreasing his  prednisone to 10 mg a day on Friday.  He is currently on 20 mg.  He  states he has been vomiting every other day especially if he eats greasy  foods.  He has some mild nausea every other day.  At nighttime when he  has diarrhea he is up every 45 minutes.  He has seen very little blood  in his stool.  He says it is every other time he wipes.  His next dose  of Cimzia is the 3rd of  June.  He denies any fever.  He is having mild  abdominal cramping.  He continues to be on a lactose free diet, and he  is using soy milk which he does not like.  His appetite has been good.  His bowel movements have been keeping him awake.   MEDICATIONS:  1. Combivent and albuterol as needed.  2. Bupropion 150 mg q.h.s.  3. Omeprazole 20 mg b.i.d.  4. Depakote 500 mg three q.h.s.  5. Risperdal 300 mg two q.h.s.  6. Trazodone two  q.h.s.  7. Lipitor 10 mg q.h.s.  8. He somehow started taking Colazal t.i.d.  9. Zyrtec 10 mg daily.  10.Azathioprine 150 mg daily since April 2009.  11.Poly-Iron twice a day  12.Potassium twice a day.  13.Calcium with vitamin D twice a day.  14.Lasix 40 mg daily.  15.Benztropine 1 mg daily.  16.Cimzia 400 mg sq every four weeks.  17.Coumadin 5 mg daily.  18.Prednisone taper.   OBJECTIVE/PHYSICAL EXAMINATION:  VITAL SIGNS:  Weight 170 pounds (down 7  pounds since February 2009), height 5 feet 6 inches, temperature 98.1,  blood pressure 100/68, pulse 88.  GENERAL:  He is in no apparent distress.  Alert and oriented x4.  LUNGS:  Clear to auscultation bilaterally.  CARDIOVASCULAR:  Regular rhythm.  No  murmurs.  ABDOMEN:  Bowel sounds are present, soft, mild tenderness to palpation  in the lower quadrants without rebound or guarding.  EXTREMITIES:  He  has a macular papular rash on his right elbow over the ulnar process.  He has hypertrophic nail  on his thumb.  NEUROLOGIC:  He has no focal  neurologic deficits.   ASSESSMENT:  Mr. Dilger is a 45 year old male with Crohn's colitis which  is partially controlled.  His symptoms were more ideally controlled on  higher dose of steroids, but we are attempting to taper those off while  on one Imuran, Cimzia.  He is a  difficult intravenous access patient  and the Port-A-Cath was placed for blood draws and infusions, but now  that has had to be removed.  Thank you for allowing me to see Mr. Space  in consultation.   RECOMMENDATIONS:  1. Will give him a dermatology referral for his onychomycoses and his      rash on his right arm in light of the fact that he is on multiple      immunosuppressive medications.  2. Will work to reschedule his appointment with Dr. Rickard Rhymes      at [facility] for a second      opinion in regards to management of his Crohn's disease which has      never been ideally  controlled.  3. He may use Bentyl 10 mg 1-2 q.h.s. to prevent nocturnal stools.  4. He is to continue a lactose free diet.  He should continue the      Imuran 150 mg daily and the Cimzia every four weeks.  Will continue      to attempt to titrate his prednisone to 5 mg daily.  We will avoid      an abrupt cessation of the prednisone since he has been on it for      at least 2-3 months.  5. Follow-up appointment to see me in one month.  6. Will schedule an appointment with Dr. Everardo All to manage his      left upper extremity      DVT and his Coumadin therapy.  7. Stop the Colazal.       Caro Hight, M.D.  Electronically Signed     SM/MEDQ  D:  02/19/2008  T:  02/19/2008  Job:  [id number]   cc:   Marval Regal, M.D.  Gaston Islam. Tressie Stalker, MD

## 2011-02-16 NOTE — H&P (Signed)
[name], CWYNAR NO.:  [id number]   MEDICAL RECORD NO.:  [id number]          PATIENT TYPE:  INP   LOCATION:  A204                          FACILITY:  [facility]   PHYSICIAN:  [name] Caster, DO    DATE OF BIRTH:  [birth year]   DATE OF ADMISSION:  09/14/2007  DATE OF DISCHARGE:  LH                              HISTORY & PHYSICAL   CHIEF COMPLAINT:  Abdominal cramping and weakness.   PRIMARY CARE PHYSICIAN:  Dr. Bridget Hartshorn.   HISTORY OF PRESENT ILLNESS:  This is a 45 year old, Caucasian male who  presents with a 3-week history of stomach cramping and weakness.  The  patient also admits to having some lightheadedness and elevated heart  rate today.  The patient is being followed by a psychiatrist and was  told to come to the emergency room to be evaluated secondary to his  elevated heart rate and lightheadedness.  The patient does have a  history of chronic colitis and diarrhea nd this is being followed by Dr.  Stann Mainland.  The patient has a history of Crohn's colitis and had a recent  colonoscopy on July 10, 2007, by Dr. Stann Mainland and was found to have  pancolitis in the left colon.  The patient was felt to have poorly-  controlled colitis and mildly active disease.  The patient has been on  medications and was started on Imuran back in October 2008.  The  patient, at that time, was having multiple episodes of nausea, vomiting  and bloody stools and the patient states that this has continued and has  had stomach cramping and weakness recently.   PAST MEDICAL HISTORY:  1. Arthritis.  2. Bipolar disorder.  3. Crohn's disease.  4. Hypercholesterolemia.  5. Asthma.  6. GERD.   PAST SURGICAL HISTORY:  1. Kidney surgery.  2. Multiple brain operations.  3. Appendectomy.  4. Right inguinal hernia repair.   SOCIAL HISTORY:  Denies smoking, drinking or drug abuse.   CURRENT MEDICATIONS:  1. Depakote ER 1 g nightly.  2. Trazodone 100 mg nightly.  3. Lipitor 10 mg  nightly.  4. Risperdal 6 mg nightly.  5. Combivent 4 times a day.  6. Darvocet p.r.n.  7. Albuterol inhaler p.r.n.  8. Bupropion SR 150 mg b.i.d.  9. Omeprazole 20 mg b.i.d.  10.Ferrous sulfate 325 mg t.i.d.  11.Prednisone 60 mg.  12.Zyrtec 10 mg daily.   ALLERGIES:  PENICILLIN, IBUPROFEN, CODEINE AND ASPIRIN.   REVIEW OF SYSTEMS:  GI:  Stomach cramping, nausea and diarrhea.  NEUROLOGIC:  Unremarkable.  CARDIOVASCULAR:  Unremarkable.  RESPIRATORY:  Unremarkable.  ENDOCRINE:  Unremarkable.  All other systems  unremarkable.   PHYSICAL EXAMINATION:  VITAL SIGNS:  Temperature 97.7, pulse 100,  respirations 20, blood pressure 143/82, saturations 98% on room air.  GENERAL:  The patient is 45 years old, alert, oriented, cooperative and  pleasant in no acute distress.  HEENT:  No scleral icterus is noted.  Head is atraumatic, normocephalic.  Oropharynx is moist.  NECK:  Supple, nontender, nondistended.  HEART:  Regular rate and rhythm.  No murmurs, rubs  or gallops.  LUNGS:  Clear to auscultation bilaterally.  No rales, rhonchi or  wheezing.  ABDOMEN:  Soft.  Positive generalized tenderness.  No rebound or  guarding.  No hepatosplenomegaly is appreciated.  EXTREMITIES:  No clubbing, cyanosis or edema.  SKIN:  Pale, warm and dry.  No appreciated rashes.   LABORATORY DATA AND X-RAY FINDINGS:  PTT 28, PT 13.5, INR 1.0.  Reticulocyte count 2.7.  White count 11.0, hemoglobin 10.8, hematocrit  32.9, platelet count 436.  Sodium 138, potassium 3.6, chloride 103, CO2  28, glucose 92, BUN 5, creatinine 0.97.   Chest x-ray showed no acute disease.  Abdominal x-ray showed nonspecific  bowel gas pattern with possible mild ileus or gastroenteritis.   ASSESSMENT:  This is a 45 year old, Caucasian male with history of  chronic Crohn's colitis and bloody diarrhea for a few months.  The  patient presents with abdominal cramping, diarrhea and some  lightheadedness as well as some weakness.  The  patient is being followed  by gastroenterology and presents from his psychiatrist's office  secondary to elevated and weakness.   PLAN:  1. The patient will be admitted to the telemetry unit.      Gastroenterology will be consulted.  2. Secondary to the patient's anemia, 2 units of packed red blood      cells were ordered with 20 of Lasix after the second unit.  Anemia      panel as well as CBCs q.6 h. have been ordered and the patient's      medicines will be reconciled at this time.  The patient has been      placed on IV antiemetics as well as pain medications.  3. The patient is placed on GI as well as DVT prophylaxis at this      time.  4. Will await gastroenterology recommendations at this time and any      further studies will be pending their recommendations.      [name] Caster, DO  Electronically Signed     SM/MEDQ  D:  09/15/2007  T:  09/15/2007  Job:  [id number]

## 2011-02-16 NOTE — Group Therapy Note (Signed)
NAMEMICHIEL, [name] NO.:  [id number]   MEDICAL RECORD NO.:  [id number]          PATIENT TYPE:  INP   LOCATION:  A336                          FACILITY:  [facility]   PHYSICIAN:  [name], DO    DATE OF BIRTH:  Oct 09, 1965   DATE OF PROCEDURE:  11/26/2007  DATE OF DISCHARGE:                                 PROGRESS NOTE   SUBJECTIVE:  The patient is still having some abdominal pain that  radiates to the left flank. States that it is improved. The patient is  still eating and states that his diarrhea has slowed down.   OBJECTIVE:  VITAL SIGNS:  Temperature 97.4, pulse 84, respiratory rate  20, blood pressure 100/54.  CARDIOVASCULAR:  Regular rate and rhythm. No murmur, rub, or gallop.  LUNGS:  Clear to auscultation bilaterally. No rhonchi, rales, or  wheezes.  ABDOMEN:  Soft. Tender on deep palpation. Generalized with pain in his  left flank area.  EXTREMITIES:  No clubbing, cyanosis, or edema.   LABORATORY DATA:  Sodium 142, potassium 4.8, chloride 108, CO2 27,  glucose 119, BUN 3, creatinine 0.90. White count 14.7. Hemoglobin and  hematocrit 9.0 and 28.4. Platelets 305,000. C. Difficile is negative.   ASSESSMENT/PLAN:  1. Crohn's disease. The patient continues on IV steroids, IV Flagyl      and continues to be followed by gastroenterology. Continue pain      medicine and anti-emetics at this time. His IV fluids have been      decreased. His Protonix has been changed to IV. I will continue him      on his other medicines for his other chronic conditions. Anticipate      the patient will be discharged within the next 24 to 48 hours.      [name], DO  Electronically Signed     SM/MEDQ  D:  11/26/2007  T:  11/26/2007  Job:  [id number]

## 2011-02-16 NOTE — Consult Note (Signed)
[name], Lane                ACCOUNT NO.:  [id number]   MEDICAL RECORD NO.:  [id number]          PATIENT TYPE:  INP   LOCATION:  A336                          FACILITY:  [facility]   PHYSICIAN:  Caro Hight, M.D.      DATE OF BIRTH:  1965-11-27   DATE OF CONSULTATION:  11/24/2007  DATE OF DISCHARGE:                                 CONSULTATION   REFERRING PHYSICIAN:  [name], DO.   REASON FOR CONSULTATION:  Colitis.   HISTORY OF PRESENT ILLNESS:  Mr. Pimenta is a 45 year old male who has a  known history of Crohn's colitis.  His disease was diagnosed in 2003.  He has been extremely difficult to treat and was not able to be  maintained on mesalamine preparation.  Since October 2008, we have  attempted to medically manage his disease.  He was started on Imuran,  but his symptoms continue to be recurrent and Cimzia was initiated in  January 2009.  He presents today with severe pain in his left rib and  his left shoulder.  It was so bad last night that he could not sit  still; he called EMS.  He was complaining of some mild abdominal pain.  A CT scan was performed which showed possibly transverse colon and  definitely descending colon, sigmoid colon and rectal inflammation.  He  says he has a low blood in his stool.  He was initially having  problems with vomiting, but that has resolved.  He denies any nausea.  He is having 8-9 bowel movements a day that are the consistency of  mashed potatoes.  He denies any fever.   PAST MEDICAL HISTORY:  1. Hyperlipidemia.  2. Asthma.  3. GERD.  4. Bipolar disorder.  5. Insomnia.   PAST SURGICAL HISTORY:  1. Right inguinal hernia repair.  2. 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   ALLERGIES:  He has no known drug allergies.   MEDICATIONS AT HOME:  1. Combivent.  2. Albuterol.  3. Bupropion.  4. Omeprazole 20 mg twice daily.  5. Depakote.  6. Risperdal.  7. Trazodone.  8.  Lipitor.  9. Zyrtec.  10.Azathioprine 75 mg daily.  11.Nu-Iron 150 mg b.i.d.  12.Potassium.  13.Calcium..  14.Lasix 40 mg daily.  15.Benztropine 1 mg daily.   MEDICATIONS ON ADMISSION:  1. Cipro 400 mg IV q.12 h.  2. Solu-Medrol 125 mg q.6 h.  3. Protonix 40 mg q.12 h.  4. Dilaudid 2 mg IV q.4 h. as needed.  5. Zofran 4 mg IV q.4 h. as needed.   SOCIAL HISTORY:  He lives at home.  He does not use tobacco, alcohol or  recreational drugs.   FAMILY HISTORY:  He has no family history of colon cancer or colon  polyps.   REVIEW OF SYSTEMS:  As per the HPI; otherwise, all systems are negative.   PHYSICAL EXAM:  VITAL SIGNS:  Temperature 98.2, blood pressure 114/72,  pulse 121, respiratory rate 18.  GENERAL:  He is in no apparent distress, alert and  oriented x4.  HEENT:  He has bilateral nystagmus, atraumatic, normocephalic.  Pupils equal and  reactive to light.  Mouth:  No oral lesions.  He is atraumatic,  normocephalic.  NECK:  Full range of motion.  No lymphadenopathy.  LUNGS:  Clear to  auscultation bilaterally.  CARDIOVASCULAR:  Exam shows a regular rhythm,  no murmurs.  ABDOMEN:  Bowel sounds are present, soft, mild distention,  tenderness to palpation in the left upper quadrant and left lower  quadrant without rebound or guarding.  CHEST WALL:  He has tenderness to  palpation along the left rib cage.  EXTREMITIES:  No edema or cyanosis.  NEUROLOGIC:  He has no focal neurologic deficits.   LABORATORY DATA:  White count 9.2, hemoglobin 8.8 (fairly stable),  platelets 366,000; potassium 3.2, BUN 5, creatinine 1.19, lipase 13.  UA  negative.   RADIOGRAPHIC STUDIES:  CT scan showed distal esophagitis, descending  sigmoid colon colitis, proctitis and possibly transverse colon colitis.   ASSESSMENT:  Mr. Villena is a 45 year old male with partially controlled  Crohn disease with what appears to be a flare.  His left upper quadrant  pain and pain with inspiration may be due to  inflammation in his colon.  He is maintained on Imuran and Cimzia as an outpatient.  His active  disease is causing him to be anemic and he has been scheduled for 9  intravenous iron infusions.   Thank you for allowing me to see Mr. Dusek in consultation.  My  recommendations follow:   RECOMMENDATIONS:  1. I would have a low threshold for ordering a CT scan PE protocol if      his left rib pain does not improve.  2. Continue the steroids and he will get his next iron infusion as an      outpatient on February 25.  3. May advance to a low-residue diet.  4. His next Cimzia dose is December 18, 2007.  I agree with b.i.d. PPI      and would change Cipro to Flagyl.  5. He has no acute indication for endoscopy.  6. Would obtain a C. difficile toxin and routine stool culture.  7. Continue pain medicine and antiemetics.  8. We will increase his Imuran to 100 mg daily for a 1.2 mg/kg dose.  9. We will add Procrit 40,000 units subcu every Friday to hopefully      avoid subsequent blood transfusions if Hb remains <10.    OPV with [facility] or [facility] in two weeks, reason: Crohn's Disease. Stool  cx/CDIFFx1-neg.      Caro Hight, M.D.  Electronically Signed     SM/MEDQ  D:  11/24/2007  T:  11/24/2007  Job:  [id number]

## 2011-02-16 NOTE — H&P (Signed)
NAMEJANUEL, DOOLAN                ACCOUNT NO.:  [id number]   MEDICAL RECORD NO.:  [id number]          PATIENT TYPE:  INP   LOCATION:  A304                          FACILITY:  [facility]   PHYSICIAN:  Caro Hight, M.D.      DATE OF BIRTH:  Mar 29, 1966   DATE OF ADMISSION:  01/22/2008  DATE OF DISCHARGE:  LH                              HISTORY & PHYSICAL   REASON FOR ADMISSION:  Rectal bleeding.   HISTORY OF PRESENT ILLNESS:  Mr. Wildeman is a 45 year old male who was  diagnosed with Crohn's colitis in 2003.  He was followed by Dr. Laural Golden  until last year and maintained on Lialda with the requirements of  frequent steroid tapers.  His current regimen for his Crohn's colitis is  prednisone, Imuran, and Cimzia.  He has never achieved a complete  remission of his symptoms.  He states that for the last week he has had  abdominal cramps.  On Friday, he developed some rectal bleeding.  He  states he saw nothing but blood in his bowels.  Three days ago, he  started vomiting.  He had no blood in his vomit.  He reports a  temperature up to 102 degrees Fahrenheit yesterday.  He has had no fever  today.  He reports having 10 to 11 large watery bowel movements per day.  His reflux is under control.  He also complains of rash in his right arm  for approximately the last three weeks since starting the Cimzia.  It  does not itch and it is not getting any larger.  He denies any sores in  his mouth.  He describes his pain as around his naval and sharp.  It  does not radiate.  He denies any difficulty urinating or burning with  urination.  He complains of chest pain daily for 10-15 minutes 2 to 3  times a day.  He denies any shortness of breath.  He has occasional  cough.  He is having no paroxysmal nocturnal dyspnea.  He has mild  throat pain.  He denies any ear pain.  He is not having any nasal or  face pain.  He does not have any anterior nasal drainage but has a  little posterior nasal drainage.  He ihas  not received his appointment  for [facility] to be evaluated by Dr.  Renee Harder, inflammatory bowel disease specialist.   PAST MEDICAL HISTORY:  1. Hyperlipidemia.  2. Asthma.  3. GERD.  4. Bipolar disorder.  5. Nystagmus.   PAST SURGICAL HISTORY:  1. Appendectomy secondary to subacute appendicitis in 2005.  2. Right inguinal hernia repair.  3. Right hip replacement due to avascular process from prednisone      therapy.   ALLERGIES:  ASPIRIN, CODEINE, PENICILLIN, LITHIUM AND HYDROMORPHONE.   MEDICATIONS:  1. Cogentin 1 mg p.o. at 8:00 p.m. every day.  2. Wellbutrin 150 mg p.o. daily.  3. Lasix 40 mg p.o. daily.  4. Nu-Iron 150 mg b.i.d.  5. Zyrtec 10 mg daily.  6. Flagyl 500 mg every 8 hours.  7. Omeprazole 20 mg b.i.d.  8. Prednisone 40 mg daily.  9. Risperdal 8 mg every night.  10.Zocor 20 mg daily at 6:00 p.m.  11.Trazodone 200 mg p.o. every night as needed for insomnia.   FAMILY HISTORY:  He has no family history of colon cancer or colon  polyps.   REVIEW OF SYSTEMS:  Mr. Krahenbuhl has a documented weight loss of 17 pounds  since January of 2007.  He also was treated for iron-deficiency anemia  in March 2009 with ferric gluconate 125 daily for 9 doses.  He also  receives Procrit.  His review of systems as per the HPI, otherwise all  systems negative.   SOCIAL HISTORY:  He denies any alcohol or tobacco use.  His girlfriend  is 82 years old.   PHYSICAL EXAMINATION:  Afebrile and hemodynamically stable.  GENERAL:  He is in no apparent distress, alert and oriented x4.  HEENT  EXAM:  Is atraumatic, normocephalic.  Pupils equal and react to light.  Mouth:  No oral lesions.  Posterior pharynx without erythema or exudate.  NECK:  Full range of motion.  No lymphadenopathy.  LUNGS:  Clear to  auscultation bilaterally.  CARDIOVASCULAR EXAM:  Shows a regular rhythm.  No murmur.  Normal S1-S2. ABDOMEN:  Bowel sounds are present, soft,   nondistended, mild tenderness to palpation in all four quadrants without  rebound or guarding.  EXTREMITIES:  Have no cyanosis or edema.  SKIN:  He has a well demarcated lesion on his right forearm which is  maculopapular.  NEUROLOGICAL:  He has no focal neurologic deficits.   LABORATORY DATA:  Hemoglobin 12.12, potassium 3.3, creatinine normal.   ASSESSMENT:  Mr. Manrique is a 45 year old male who presents with  complaint of abdominal cramps followed by vomiting, and rectal bleeding.  He reports a fever of 102 yesterday.  The differential diagnosis  includes Crohn's flare, viral gastroenteritis, infectious colitis, and  low likelihood of small-bowel obstruction due to fistula formation from  the colon inflammation.   PLAN:  1. Will admit and follow serial hemoglobin and hematocrit and      transfuse as needed.  Will type and screen now.  2. Will continue the antiemetics and pain medicines.  3. Mr. Jawad is a difficult stick and if a mid line cannot be      obtained, then would consult general surgery for Port-A-Cath      placement.  4. Will attempt to arrange follow-up with gastroenterology at [facility] as soon as possible.  5. Will replace potassium and hydrate.     Caro Hight, M.D.  Electronically Signed    SM/MEDQ  D:  01/22/2008  T:  01/22/2008  Job:  [id number]   cc:   Marval Regal, M.D.

## 2011-02-16 NOTE — Consult Note (Signed)
[name], [name]                ACCOUNT NO.:  [id number]   MEDICAL RECORD NO.:  [id number]          PATIENT TYPE:  INP   LOCATION:  A201                          FACILITY:  [facility]   PHYSICIAN:  Edward L. Luan Pulling, M.D.DATE OF BIRTH:  August 02, 1966   DATE OF CONSULTATION:  02/01/2008  DATE OF DISCHARGE:                                 CONSULTATION   REASON FOR CONSULTATION:  Pulmonary emboli.   HISTORY:  This is a 45 year old who came to the emergency room because  of left arm swelling.  He had a Port-A-Cath placed on the left side on  January 22, 2008, and since then he has had swelling which has been  continuous and persistent.  He has had some pain and a lot of  inflammation.  His neck is also painful.  He also has a history of  Crohn's disease in the past.   His past medical history is positive for hyperlipidemia, asthma,  gastroesophageal reflux disease, bipolar disorder, chronic nystagmus,  and Crohn's disease.   Surgically, he has had an appendectomy, an inguinal hernia repair, and  right hip replacement due to avascular necrosis from prednisone therapy.   He is allergic to ASPIRIN, CODEINE, PENICILLIN, HYDROMORPHONE, and  ULTRAM.   He currently is at home on:  1. Azathioprine 100 mg every morning.  2. Bupropion 150 mg daily.  3. Calcium 600 mg in the morning and the evening.  4. Depakote 1500 mg at bedtime, the extended-release version.  5. Iron 150 mg twice a day.  6. Lasix 40 mg daily.  7. Lipitor 10 mg daily.  8. Omeprazole 20 mg daily.  9. Risperdal 8 mg at bedtime.  10.Trazodone 200 mg at bedtime.  11.Zyrtec 10 mg every morning.  12.Benztropine 1 mg daily.  13.Prednisone on a p.r.n. basis.  14.Naproxen as needed.   Family history is not positive for any sort of clotting disorders as far  as is known.   His physical exam shows that he is awake and alert.  He does have marked  bilateral nystagmus.  His temperature is 100.3, pulse 111, respirations  20, blood  pressure 119/66, and O2 sat is 94% on room air.  His pupils do  react.  He has some swelling in his neck.  He has marked swelling of his  left arm.  His chest is fairly clear with no wheezing now.  His heart is  regular without murmur, gallop, or rub.  His abdomen soft without  masses.  His extremities showed no edema, and his central nervous system  examination is grossly intact.   His CT chest shows clot that extends down from the Port-A-Cath down into  the superior vena cava as well as pulmonary emboli.  He may well be a  candidate for some sort of a clot-busting procedure, and I have  discussed this with Dr. Gilman Schmidt, and he will discuss with interventional  radiologist.  Otherwise, continue with Lovenox.      Edward L. Luan Pulling, M.D.  Electronically Signed     ELH/MEDQ  D:  02/01/2008  T:  02/01/2008  Job:  [id number] 

## 2011-02-16 NOTE — H&P (Signed)
[name], ANTRIM NO.:  [id number]   MEDICAL RECORD NO.:  [id number]          PATIENT TYPE:  EMS   LOCATION:  ED                            FACILITY:  [facility]   PHYSICIAN:  Bonnielee Haff, MD     DATE OF BIRTH:  1966/01/31   DATE OF ADMISSION:  04/05/2008  DATE OF DISCHARGE:  LH                              HISTORY & PHYSICAL   PRIMARY MEDICAL DOCTOR:  Dr. Bridget Hartshorn.   GASTROENTEROLOGIST:  Caro Hight, M.D.  He is also followed by gastroenterology at [facility] (Dr.  Ihor Austin);  and also has been evaluated by general surgeon Dr. Morton Stall regarding his  Crohn's disease.   ADMITTING DIAGNOSES:  1. Gastrointestinal bleeding, likely secondary to Crohn's.  2. Orthostatic hypotension.  3. Bipolar disorder.  4. 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  5. Arthritis.  6. Skin rash.   CHIEF COMPLAINT:  Bleeding per rectum for last 2 days.   HISTORY OF PRESENT ILLNESS:  The patient is a 45 year old white male who  is well-known to our service for admissions in the last few months.  He  has a history of Crohn's disease which was diagnosed about 6 years ago.  The patient during the last admission was transferred to [facility], where he underwent a colonoscopy which showed involvement I  believe of the ascending colon.  I am not sure if there was involvement  of the ileum or not.  Because of recurrence of his bleeding, it was  recommended that he undergo open colectomy.  The patient was to be  medically stabilized before undergoing the surgery.  The patient was  discharged from [facility] about 3 weeks ago.  He presents today to the  hospital with a 2-day history of bright red blood per rectum.  He has  noticed a bowel movement about 15-18 times every day.  He almost has  about 1-2 glass full of blood whenever he uses the bathroom.  This is  also associated with pain in the abdomen, 7 out of 10  intensity; with no  radiation but with sharp shooting pain.  He has not had any syncope but  has had dizziness.  He also admits to having weight loss of about 5  pounds in the last 2 weeks.  He also has episodes of nausea and vomiting  with brownish material, but no fresh blood; as well as with last episode  this morning.   The patient is somewhat of a poor historian, does not know who he saw  recently in the office; might have been Dr. Morton Stall and might have been  Dr. Ihor Austin, he does not know.   MEDICATIONS AT HOME:  1. Omeprazole 20 mg b.i.d.  2. Bupropion sustained release 150 mg once a day.  3. Zyrtec 10 mg once daily.  4. Calcium carbonate 200 mg each morning and 600 mg nightly.  5. Azathioprine 150 mg once daily.  6. Poly iron 150 mg b.i.d.  7. Lasix 40 mg once a day.  8.  Potassium chloride 20 mEq once a day.  9. Depakote ER 500 mg nightly.  10.Risperdal 8 mg nightly.  11.Lipitor 10 mg once a day.  12.Trazodone 1 mg nightly.  13.Coumadin 5 mg daily.  14.Hydrocodone acetaminophen 7.5 every 6 hours as needed.  15.Multivitamin.  16.Dicyclomine 10 mg at bedtime.  He is also on a medication which could be benztropine; though he is not  very sure. Cipro is also listed, but not sure when the last time was he  took this.  He has been off of Prednisone for 2 weeks; he was on taper  at that time.   ALLERGIES:  ASPIRIN, CODEINE, IBUPROFEN, PENICILLIN, ULTRAM.   PAST MEDICAL HISTORY:  1. Crohn's diagnosed 6 years ago, but he has been having symptoms      prior to that as well.  2. Anemia.  3. Arthritis.  4. Bipolar disorder.  5. He has left IJ deep venous thrombosis, diagnosed in April.  He had      a Port-A-Cath at that time.  He also was diagnosed with pulmonary      embolism at the same time.  He was started on Coumadin.  6. History of bilateral shoulder replacement.  7. Right hip replacement.  8. Appendectomy.  9. Eye surgery.  10.Kidney surgeries.   SOCIAL HISTORY:   Lives in [place] alone, but his girlfriend does  spend time with him at times.  No smoking use currently; quit 2 years  ago.  Quit alcohol 5 years ago.  Quit illicit drug 10 years ago.   FAMILY HISTORY:  Positive for diabetes and heart disease.   REVIEW OF SYSTEMS:  GENERAL:  Positive for weakness, malaise and  dizziness.  HEENT: Unremarkable.  CARDIOVASCULAR:  Unremarkable.  GI: As  in HPI.  GU: Unremarkable.  RESPIRATORY:  Unremarkable.  MUSCULOSKELETAL:  Unremarkable.  DERMATOLOGIC:  Positive for skin  rashes.  NEUROLOGIC:  Unremarkable.  PSYCHIATRIC:  Positive for bipolar.  All other systems unremarkable.   PHYSICAL EXAMINATION:  VITAL SIGNS:  Temperature 98.5, blood pressure  111/67, heart rate in the 120s, regular respiratory rate 18, saturation  100% on room air.  GENERAL:  This is a thin white male in no distress.  HEENT: There is no pallor, no icterus.  Oral mucous membranes were dry.  No oral lesions are noted.  NECK:  Soft and supple.  No thyromegaly is appreciated.  LUNGS:  Clear to auscultation bilaterally.  CARDIOVASCULAR:  Tachycardiac, regular.  No murmurs appreciated.  ABDOMEN:  Slightly tender; no rebound rigidity or guarding.  There is a  mark on the right side of his abdomen which he says is the mark where  the colostomy will be placed; this was placed by the surgery team at  [facility].  Bowel sounds are present.  EXTREMITIES:  Show no edema.  SKIN:  Shows erythematous maculopapular rash, pretty much all over his  body -- which he says has been present for a long time.  NEUROLOGICAL:  He is alert and oriented x3.  No focal neurological  deficits are present.   LAB DATA:  White count normal, hemoglobin 10.6, MCV 95, platelet count  237.  His INR is 2.4.  Sodium 133, total protein 4.7, albumin 1.7,  calcium 7.8.   No imaging studies have been done today.   ASSESSMENT/PLAN:  This is a 45 year old Caucasian male who has a history  of Crohn's disease.  He   presents with bright red blood per rectum for 2  days.  He possibly has exacerbation of Crohn's.  Elevated INR could be  playing a role, but I think he has a lesion which is bleeding and this  is secondary to Crohn's.   1. Bright red blood per rectum in the setting of Crohn's disease.  We      will stabilize him a little bit.  We will transfuse him and correct      his INR.  I will give him IV fluids, monitor him closely in the      ICU.  Since he was on steroids and since he is a little bit      hypotensive, I think he should probably go on stress dose steroids.      I spoke with consultant Dr. Caro Hight, who recommends      transferring this patient to [facility].  I spoke with initially the      hospitalist service, Dr. Verdene Lennert, who said to call the surgery      service.  I spoke to Dr. Drexel Iha, who is covering for Dr. Morton Stall;      and he thinks the patient probably needs to be transferred, though      he wants the patient to go on the medical service with plans for      surgery early next week.  So, I spoke to Dr. Verdene Lennert again.  She      wanted me to call her back on Sunday for transfer.  I told her that      patient could potentially get worse here, with more profuse      bleeding; and at that time we would probably need the surgery team      involved quickly.  Despite this, Dr. Verdene Lennert was reluctant to      transfer this patient and she still wanted [place] to take care of him      here.  If he indeed decompensates, she would like to be called at      that time.  So, in view of this, I have made all attempts I can to      transfer this patient to [facility]  I think it is probably      reasonable for this patient to stay here for now.  We can closely      monitor him here in the intensive care unit and stabilize him a      little bit.  If he indeed decompensates we will call [facility] back      at that time.  I have apprised the patient of all of the above, and      he understands the  situation.  2. Anemia.  Probably hemo concentrated so his hemoglobin will probably      drop a little bit.  We will go ahead and transfuse him 1 unit of      packed red blood cells.  We will check his hemoglobin and      hematocrit q.6 h.  3. Coagulopathic.  He has a slightly elevated INR of 2.4.  He is on      Coumadin for deep vein thrombosis and pulmonary embolus.  Because      he is bleeding so much I think I will go and reverse his INR for      now.  He has been on the Coumadin only for about 2-1/2 months now.      Heparin bridging may have to be considered at some point.  4. His other medical issues appear to be stable.  I will hold off on      his Lasix.  I am not sure why he is taking it at this time.  I will      also hold off on his other psychotropic medications for now, and      restart them as he stabilizes a little bit more.  Deep vein      thrombosis prophylaxis with sequential compressive devices.   If indeed a central line is needed, I will call in a general surgeon to  help [place] with that.   So, hopefully the patient will be transferred on Sunday to [facility], as I  think he needs surgery sooner than later.  We will monitor him closely  here at [facility].  We will also consult GI to help [place] manage this  issue.      Bonnielee Haff, MD  Electronically Signed     GK/MEDQ  D:  04/05/2008  T:  04/05/2008  Job:  [id number]   cc:   Caro Hight, M.D.  [address]  [place] , [place] [id number]

## 2011-02-16 NOTE — Assessment & Plan Note (Signed)
NAMEMarland Kitchen  EUSEBIO, BLAZEJEWSKI                 CHART#:  [id number]   DATE:  11/14/2007                       DOB:  05/06/1966   REFERRING PHYSICIAN:  Dr. Marval Regal.   PROBLEM LIST:  1. Partially controlled Crohn's colitis on Imuran and Cimzia.  2. Anemia.  3. Bipolar disorder.  4. Arthritis.  5. History of avascular necrosis.  6. Hyperlipidemia.  7. Asthma.  8. Gastroesophageal reflux disease.   SUBJECTIVE:  Mr. Cerone is a 45 year old male who has had Crohn's  colitis which has been difficult to control.  He was started on Cimzia a  month ago and his symptoms are now improved.  He has seen no bleeding in  his stools for the last 4 weeks.  He is having 5-6 bowel movements a  day, 2-3 occur at nighttime.  His abdominal cramps have resolved.  He  denies any fever.  He has some mild chest pain.  He has shortness of  breath on walking up stairs only.  He has no vomiting.  He saw black  stool twice.  He continues on a low residue, low fiber, lactose free  diet.  He was asked to come into the office because his hemoglobin  dropped from 11.1 in January 2009 to 8.6 in February.   MEDICATIONS:  1. Combivent.  2. Albuterol.  3. Bupropion.  4. Omeprazole 20 mg daily.  5. Depakote.  6. Risperdal.  7. Trazodone.  8. Lipitor.  9. Zyrtec.  10.Azathioprine 75 mg daily.  11.Nu-Iron 150 mg b.i.d.  12.Potassium.  13.Calcium.  14.Lasix 40 mg daily.  15.Benztropine 1 mg daily.   OBJECTIVE:  VITAL SIGNS:  Weight 177 pounds (down 2 pounds since January  2009), height 5 feet 6 inches, temperature 97.9, blood pressure 104/70,  pulse 60.GENERAL:  He is in no apparent distress.  Alert and oriented  x4.  LUNGS:  Clear to auscultation bilaterally.HEENT:  Showed his  sclerae are anicteric.  CARDIOVASCULAR:  Regular rhythm.  No  murmurs.ABDOMEN:  Bowel sounds are present.  Soft, nontender,  nondistended.  EXTREMITIES:  No cyanosis or edema.  SKIN:  He has no  pretibial lesions.   ASSESSMENT:  Mr.  Grunder is a 45 year old male who has likely anemia from  active colitis which is now being fairly well treated with Cimzia and  Imuran.  He currently has anemia but no evidence of active bleeding. He  has recently had endoscopy.   Thank you for allowing me to see Mr. Ander in consultation.  My  recommendations follow.   RECOMMENDATIONS:  1. He will begin sodium ferric gluconate 125 mg IV 3 times a day for      nine doses.  On the day of his first infusion, we will obtain a CBC      with diff, reticulocyte count, ferritin, and a hepatic function      panel.  2. He has a followup visit in one month.  3. He will continue his Imuran.  We will repeat his blood draw for a      CBC once the iron infusions are complete.       Caro Hight, M.D.  Electronically Signed     SM/MEDQ  D:  11/15/2007  T:  11/16/2007  Job:  [id number]

## 2011-02-16 NOTE — Assessment & Plan Note (Signed)
NAMEMarland Kitchen  LAVELLE, AKEL                 CHART#:  [id number]   DATE:  07/25/2007                       DOB:  11/08/1965   CHIEF COMPLAINT:  Followup Crohn colitis.   SUBJECTIVE:  The patient is a 45 year old Caucasian male who has history  of Crohn colitis.  He was diagnosed in 2003.  He recently had  significant flare along with anemia.  His hemoglobin had dropped to 7.8.  He received a 2-unit transfusion.  His hemoglobin post transfusion was  10.4.  He had normal LFTs.  He underwent ileocolonoscopy with biopsy by  Dr. Stann Mainland on 07/10/07.  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  Biopsies were consistent with this.  He had a normal  distal TI and ileocecal valve.  He had been taking Colazal 750 mg t.i.d.  She started him on prednisone 40 mg daily with initial plan to begin  Imuran to see if we could get the symptoms controlled on steroid-sparing  therapy.  He had been on Remicade early in his disease.  He is on iron  325 mg t.i.d.  His fatigue is improving.  His appetite is good.  He is  having 3 to 4 nonbloody bowel movements per day.  He is having some low  abdominal cramping and pain.  He is taking Darvocet for pain.   CURRENT MEDICATIONS:  See the list from 07/25/07.   ALLERGIES:  1. PENICILLIN.  2. IBUPROFEN.  3. CODEINE.  4. ASPIRIN.   OBJECTIVE:  VITAL SIGNS:  Weight 182 pounds.  Height 66 inches.  Temp  97.7.  Blood pressure 122/82.  Pulse 72.  GENERAL:  The patient is a 45 year old Caucasian male who is alert,  oriented, pleasant and cooperative, no acute distress.  HEENT:  Sclerae clear.  Nonicteric conjunctivae.  Oropharynx pink and  moist without any lesions.  CHEST:  Heart regular rate and rhythm.  Normal S1 and S2.  ABDOMEN:  Protuberant with positive bowel sounds x4.  No bruits  auscultated.  Soft, nontender, and nondistended without palpable mass or  hepatosplenomegaly.  No rebound tenderness or guarding.  EXTREMITIES:  Without clubbing  or edema bilaterally.   ASSESSMENT:  The patient is a 45 year old Caucasian male with Crohn  colitis with moderately active disease based on colonoscopy findings and  symptoms.  He had significant anemia status post a 2-unit transfusion.  Good response thus far to prednisone, however, given his history, we are  obviously trying to limit his exposure to prednisone.   PLAN:  1. We will need to check a creatinine before the initiation of Imuran.  2. Begin Imuran 50 mg daily, #30, with 1 refill.  He will need a CBC      weekly for 4 weeks then every 2 weeks for 4 checks.  He was given a      work note for this appointment today.  3. He is to continue the iron for now.  4. He is to incrementally decrease the prednisone by 10 mg weekly as      discussed by Dr. Stann Mainland.  5. Office visit in 1 month with Dr. Caro Hight or sooner if needed.       Vickey Huger, N.P.  Electronically Signed     Caro Hight, M.D.  Electronically Signed  KJ/MEDQ  D:  07/25/2007  T:  07/26/2007  Job:  [id number]

## 2011-02-16 NOTE — Consult Note (Signed)
[name], [name]                ACCOUNT NO.:  [id number]   MEDICAL RECORD NO.:  [id number]          PATIENT TYPE:  INP   LOCATION:  IC06                          FACILITY:  [facility]   PHYSICIAN:  Caro Hight, M.D.      DATE OF BIRTH:  1965/11/26   DATE OF CONSULTATION:  03/17/2008  DATE OF DISCHARGE:                                 CONSULTATION   REASON FOR CONSULTATION:  Rectal bleeding.   HISTORY OF PRESENT ILLNESS:  [name] is a 45 year old male who was  diagnosed with Crohn's disease in 2003.  He had a laparoscopic  appendectomy for acute appendicitis in 2005.  He was maintained on  Colazal and intermittent steroids until October 2008.  He had an  ileocolonoscopy which showed pancolitis and was pronounced in the left  colon with a normal distal terminal ileum.  He was maintained on his  Colazal and placed on a prednisone taper with plans to begin Imuran.  His course of Crohn's disease has been complicated by avascular necrosis  requiring right hip replacement.  In October 2008, he was started on  Imuran at 50 mg daily.  He returned in November 2008, continued to  complain of multiple bloody stools per day and was requiring  intermittent blood transfusions.  His Imuran was increased to 75 mg per  day.  In December 2008, he presented to the emergency department with  tachycardia, heart rate in the 120s.  In December 2008, he was on  Colazal, Imuran and prednisone taper.  His hemoglobin has been as low as  7.8 requiring blood transfusion.  In January 2009, he presented as an  outpatient.  His plan was to start him on Cimzia.  Due to transportation  issues, Remicade was not an option.  In January 2009, he was still  having 7-8 bowel movements a night on prednisone 30 and Imuran 75 mg  daily.  He was started on Cimzia in January 2009, and seen in follow-up  in February 2009.  He stated he was not seeing any bleeding and was  having 5-6 bowel movements a day with 2-3 occurring at  nighttime.  Abdominal cramps had resolved.  His hemoglobin continued to trend down  from 11.1 in January 2009 to 8.6 in February.  He was scheduled for  ferric gluconate 125 mg IV for nine doses.  The iron infusions were in  attempt to avoid subsequent blood transfusions.   Around November 24, 2007, he presented with severe left rib and left  shoulder pain.  CT scan shows transverse colon and descending colon,  sigmoid colon and rectal inflammation.  He was having 8-9 bowel  movements a day.  The flare occurred while on Imuran 75 mg daily, Cimzia  and prednisone.  His Imuran in February 2009 was increased to 100 mg  daily for 1.2 mg/kg dose.  He was discharged after 4 days on prednisone,  Imuran 75 mg daily, Cimzia and Flagyl.  He was able to be tapered off of  his prednisone in March 2009, but presented again to the clinic in April  2009 with abdominal cramping and bloody stool.  Three weeks later, on  January 22, 2008, he presented again to the hospital with rectal bleeding.  He also had a temperature of 102.  He was having 10-11 large watery  bowel movements per day.  He was on Flagyl 500 mg every 8 hours,  prednisone 40 mg daily, Imuran 100 mg daily and Cimzia every 4 weeks.  A  Port-A-Cath was placed due to poor venous access and need for frequent  hospitalizations with hydration and IV medication.  Five days later, he  presented with left arm swelling and was subsequently found to have  right upper extremity DVT.  He is also noted to have multiple pulmonary  emboli on CT scan on April 29.  His Port-A-Cath was removed and he was  placed on Coumadin and Lovenox.  He was discharged 10 days later on  Coumadin and Lovenox.  His Imuran had been increased to 150 mg daily.  He was discharged on the Cimzia, and the Flagyl was stopped.  His  prednisone was down to 5 mg.  There was a return patient visit on May  18.  He was on prednisone 20 mg, Imuran 150 mg, and Cimzia every 4  weeks.  He was  not having diarrhea during the daytime, but only at  night.  He was seeing very little blood in stool.  His next dose of  Cimzia was scheduled for June 3.  He was seen and evaluated by Dr.  Rickard Rhymes last week and plan was for outpatient colonoscopy and  subsequent total colectomy with ileostomy which he discussed with Mr.  Bugaj.   [name] was feeling fairly well during the early part of the week,  but on yesterday, he had sensation as if somebody kicked him in his  abdomen.  He describes pain as diffuse but mild.  It was associated with  rectal bleeding.  He has had three to four episodes of rectal bleeding.  He has had a little diarrhea.  He vomited yesterday and the day  before.  He has seen no blood in the vomit.  He has had no nausea.  He  has had no fever.  He has no chills.  He is not using any anti-  inflammatory drugs.  He does not use tobacco products.  He did fall  yesterday when he was getting into the ambulance.   PAST MEDICAL HISTORY:  1. Nystagmus since birth  2. Hyperlipidemia.  3. Asthma.  4. GERD.  5. Bipolar disorder.  6. Insomnia.  7. Enterococcus UTI treated with Levaquin in April 2009.   PAST SURGICAL HISTORY:  1. Right inguinal hernia repair.  2. 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   ALLERGIES:  NO KNOWN DRUG ALLERGIES .   MEDICATIONS:  1. Combivent and albuterol.  2. Bupropion 150 mg q.h.s.  3. Omeprazole 20 mg t.i.d.  4. Depakote 100 mg three q.h.s.  5. Risperdal 300 mg two q.h.s.  6. Trazodone two q.h.s.  7. Lipitor 10 mg q.h.s.  8. Zyrtec 10 mg daily.  9. Azofibrin 150 mg daily since April 2009.  10.Poly-iron twice a day.  11.Potassium twice a day.  12.Calcium with vitamin D twice a day.  13.Lasix 40 mg daily.  14.Benztropine 1 mg daily.  15.Cimzia  400 mg subcu every 4 weeks.  16.Coumadin 5 mg daily.   MEDICATIONS ON ADMISSION:  1. Protonix 40 mg  IV b.i.d.  2. Flagyl 500 mg  IV q. 6 h.  3. Levaquin 500 mg IV daily.  4. Diflucan 100 mg IV daily.  5. Depakote 1500 mg daily.  6. Risperdal 4 mg daily.  7. Nystatin cream b.i.d. to affected areas.  8. Solu-Medrol 60 mg IV q.6 h.  9. Phenergan 12.5 mg IV every 6 hours as needed.  10.Zofran 4 mg p.o. q.4 hours as needed.  11.Lipitor 10 mg daily.  12.Trazodone 100 mg daily.  13.Poly-iron 150 mg daily.  14.Azofibrin 150 mg daily.  15.Calcium carbonate 600 mg b.i.d.  16.Zyrtec 10 mg daily.  17.Bupropion 150 mg p.o. daily.   FAMILY HISTORY:  He has no family history of colon cancer or colon  polyps.   SOCIAL HISTORY:  He lives at home with his girlfriend Rosiland Oz.  Her  phone number is [phone].  He does not use alcohol or recreational  drugs.   REVIEW OF SYSTEMS:  Per the HPI, otherwise, all systems are negative.   PHYSICAL EXAMINATION:  VITAL SIGNS:  Temperature 98.3, blood pressure  127/67, pulse 118, respiratory rate 11.  GENERAL:  He is in no apparent distress, alert and oriented x4.  HEENT:  Exam atraumatic, normocephalic.  Pupils equal, react to light, lateral  nystagmus, posterior pharynx without erythema or exudate, no oral  lesions.  NECK:  Full range of motion.  No lymphadenopathy.  LUNGS:  Clear to auscultation bilaterally.  CARDIOVASCULAR:  Shows regular rhythm, no murmur.  No S1, S2.  ABDOMEN:  Bowel sounds are present, soft, moderate tenderness to palpation in the  left lower quadrant and left upper quadrant with mild tenderness to  palpation in the right lower quadrant, nontender in the right upper  quadrant, no rebound or guarding.  EXTREMITIES:  He has 1+ to trace edema in his left upper extremity.  He  has trace edema in his bilateral lower extremities.  No cyanosis.  NEUROLOGICAL:  He has no new focal neurologic deficits.   LABORATORY DATA:  White count 14.6, hemoglobin 9.9, platelets 449, INR  1.2, sodium 129, potassium 3.9, BUN 10, creatinine 1.35, alk phos 146,  AST 23, ALT  16, albumin 1.8.  UA:  Nitrite positive, leukocyte negative,  rare squamous epi's, WBCs 11-20, few bacteria.   ASSESSMENT:  Mr. Iwan is a 45 year old male with Crohn's colitis which  has been poorly controlled for the last year and a half.  He has  required chronic steroid use and has failed to achieve clinical  remission on Imuran, Cimzia and prednisone.  He now presents with rectal  bleeding which is likely secondary to Crohn's flare.  He did miss his  dose of Cimzia March 15, 2008.  He also has been off steroids since  Monday.  [facility] admitting hemoglobin is 9.9 which is in his usual range.  Thank you for allowing me to see Mr. Mossa in consultation.  My  recommendations follow.   RECOMMENDATIONS:  1. Will restart his steroids, agree with Solu-Medrol and arrange      transfer to [facility] for colonoscopy and      consideration for subsequent total colectomy with ileostomy.  2. Serial CBCs and transfuse as needed.  3. Continue Imuran and Cimzia for now.  4. Will hold his anticoagulation and monitor his left upper extremity      as well as signs or symptoms for pulmonary embolus.  5. Agree with treating his urinary tract infection with Levaquin and  will await cultures which are pending.  6. Hold iron in anticipation of colonoscopy at [facility].  7. Agree with obtaining stool studies.   ADDENDUM:  Spoke with Dr. Milderd Meager at [phone], and the patient will be  transferred today to a monitored bed for subsequent evaluation at [facility].      Caro Hight, M.D.  Electronically Signed     SM/MEDQ  D:  03/17/2008  T:  03/17/2008  Job:  [id number]   cc:   Marval Regal

## 2011-02-16 NOTE — Group Therapy Note (Signed)
[name], Lane                ACCOUNT NO.:  [id number]   MEDICAL RECORD NO.:  [id number]          PATIENT TYPE:  INP   LOCATION:  A201                          FACILITY:  [facility]   PHYSICIAN:  Edward L. Luan Pulling, M.D.DATE OF BIRTH:  January 06, 1966   DATE OF PROCEDURE:  DATE OF DISCHARGE:                                 PROGRESS NOTE   Patient of [facility] hospitalist team.   [name] is sent for PICC line placement.  His Port-A-Cath is out.  His arm seems a little better.   Temp is 98.3, pulse 90, respirations 18, blood pressure 119/73, and O2  sat is 99% on room air.   His prothrombin time is not therapeutic as yet.  His hemoglobin level is  10, so it seems fairly stable.   My plan then is to continue with his treatment and medications, no  changes today; and re-evaluate after PICC line placement and we can get  him fully anticoagulated.      Edward L. Luan Pulling, M.D.  Electronically Signed     ELH/MEDQ  D:  02/04/2008  T:  02/04/2008  Job:  [id number]

## 2011-02-16 NOTE — Group Therapy Note (Signed)
[name], [name]                ACCOUNT NO.:  [id number]   MEDICAL RECORD NO.:  [id number]          PATIENT TYPE:  INP   LOCATION:  A201                          FACILITY:  [facility]   PHYSICIAN:  Edward L. Luan Pulling, M.D.DATE OF BIRTH:  05-20-1966   DATE OF PROCEDURE:  DATE OF DISCHARGE:                                 PROGRESS NOTE   Patient of the [facility] Hospitalist Team.   SUBJECTIVE:  [name] seems to be doing much better.  I discussed his  situation with Dr. Leory Plowman last night and plans are to slowly  anticoagulate him because of the concerns about his Crohn's disease.  [name] says this morning he feels better.  He has no new complaints.  His arm is less swollen.   His exam shows his temperature is 97.8, pulse 79, respirations 20, blood  pressure 118/79, and O2 sats 94% on room air.   He is being treated for his Crohn's disease with immunosuppressive  agent, so there is some concern about his immune status, but otherwise,  he is doing better.  He appears to have no current respiratory problems,  and now that his plans are set, I am going to go ahead and sign off.   Thanks again for allowing me to see him with you.  I will be glad to  become active again at your request.      Percell Miller L. Luan Pulling, M.D.  Electronically Signed     ELH/MEDQ  D:  02/06/2008  T:  02/06/2008  Job:  [id number]

## 2011-02-16 NOTE — Group Therapy Note (Signed)
NAMECASHEL, [name] NO.:  [id number]   MEDICAL RECORD NO.:  [id number]          PATIENT TYPE:  INP   LOCATION:  A201                          FACILITY:  [facility]   PHYSICIAN:  Girard Cooter, MD         DATE OF BIRTH:  20-Jun-1966   DATE OF PROCEDURE:  DATE OF DISCHARGE:                                 PROGRESS NOTE   The patient was examined at the beside.  He states he feels better.  The  left arm is less swollen today.   PHYSICAL EXAMINATION:  The patient is in no acute distress.  CARDIOVASCULAR:  S1, S2.  ABDOMEN:  Soft, nontender, nondistended.  Positive bowel sounds.  Upper extremity is still swollen.   INR currently today is 1.4 still.  His white count is 8.1, hemoglobin  9.5.  Hypercoagulable panel was pretty much negative so far.   ASSESSMENT AND PLAN:  Upper extremity deep vein thrombosis, history of  Crohn's disease, history of chronic anemia with no change in Hgb.  The  patient is doing well.  He is not getting therapeutic on his Coumadin  currently.  The best option for the patient would be to go home with  Lovenox and Coumadin with home health in the next day or 2.  I will go  ahead and get a case managment consult for that to get everything set up  for home.      Girard Cooter, MD  Electronically Signed     RR/MEDQ  D:  02/10/2008  T:  02/10/2008  Job:  [id number]

## 2011-02-16 NOTE — H&P (Signed)
[name], FUSTON NO.:  [id number]   MEDICAL RECORD NO.:  [id number]           PATIENT TYPE:  INP   LOCATION:  A336                          FACILITY:  [facility]   PHYSICIAN:  [name], DO    DATE OF BIRTH:  October 16, 1965   DATE OF ADMISSION:  11/24/2007  DATE OF DISCHARGE:  LH                              HISTORY & PHYSICAL   CHIEF COMPLAINT:  Abdominal pain.   HISTORY OF PRESENT ILLNESS:  This is a 45 year old very, well known to  our service, Caucasian male with known history of Crohn's colitis.  The  patient presents with abdominal pain, having problems with severe left  rib pain and left shoulder pain.  Patient has had a history of  longstanding Crohn's colitis and it has been very difficult to treat.  He is being seen by gastroenterology on a regular basis.  Patient states  that night prior to being admitted he had a bad episode of left  rib/abdominal pain and called EMS.  He was seen.  A CT scan was  performed that showed possible transverse colon and definitely  descending colon, sigmoid colon and rectal inflammation and patient  states he saw slight blood in the stool.  The patient was seen, admitted  and seems to be improving.   PAST MEDICAL HISTORY:  1. Longstanding Crohn's colitis.  2. Hyperlipidemia.  3. Asthma.  4. GERD.  5. Bipolar.  6. Insomnia.   PAST SURGICAL HISTORY:  1. Kidney surgery.  2. Multiple brain operations.  3. Appendectomy.  4. Right inguinal hernia repair.   SOCIAL HISTORY:  Denies smoking, drinking, alcohol or drug abuse.   CURRENT MEDICATIONS:  1. Bupropion SK 150 mg daily.  2. Omeprazole 20 mg, one in the morning and one at night.  3. Zyrtec 10 mg daily.  4. Calcium 60 mg, one in the morning, one at night.  5. Azathioprine 50 mg, 1-1/2 tabs daily.  6. Poly-Iron 150 mg, one in the morning and one at night.  7. Furosemide 40 mg daily.  8. Depakote 500 mg, two at bedtime.  9. Risperdal 4 mg, two at bedtime.  10.Lipitor 10 mg, one at bedtime.  11.Potassium chloride 20 mEq daily.  12.Trazodone 100 mg, two at night.  13.Combivent as needed.  14.Albuterol as needed.  15.Benzatropine 1 mg daily.   REVIEW OF SYSTEMS:  Located in his HPI.   PHYSICAL EXAM:  VITAL SIGNS:  Temperature is 98, pulse 107, respirations  20, blood pressure 113/69.  GENERAL:  He is awake and alert, no obvious distress.  Patient is well-  nourished, well-hydrated, well developed.  HEENT:  Head is atraumatic, normocephalic.  He has nystagmus.  Pupils  are equal, reactive to light and accommodation.  NECK:  Soft, supple, nontender, nondistended.  LUNGS:  Clear to auscultation bilaterally.  No rales, rhonchi or  wheezing.  CARDIOVASCULAR:  Regular rate and rhythm.  No murmurs, rubs or gallops.  ABDOMEN:  Has bowel sounds, mildly distended.  He is tender in the left  upper quadrant in his ribcage area, no rebound or guarding.  EXTREMITIES:  No clubbing, cyanosis or edema.  NEUROLOGIC:  He had no obvious focal deficits.  Cranial nerves II  through XII are grossly intact.   LABS:  White count is 18.5, hemoglobin 9, hematocrit 27.6, platelets  333,000.  C. diff is negative.  A CT of his abdomen and pelvis showed a  right base atelectasis, left base airspace disease most likely  atelectasis, small left-sided pleural effusion, normal heart size  without pericardial effusion.  Some pleural fluid with mildly thick  walled distal esophagus, showed moderate descending colitis likely  indicative of active Crohn's.  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  Greater curvature gastric wall thickening, question  gastritis vs. active Crohn's.  Area of focal colonic wall thickening  transverse segment, this also could be related to area of colitis.   ASSESSMENT:  1. Active Crohn's disease.  2. Anemia.  3. Abdominal pain.   PLAN:  The patient will be admitted to general medical bed with a GI  consultation.  Patient  will be continued on his IV steroids.  He is due  for iron infusion as an outpatient on November 29, 2007.  His diet is a  low residue diet.  His PPI will be twice daily and his antibiotic will  be changed from Cipro to Flagyl.  The C. difficile at this time is  negative.  Will continue pain medicine, antiemetics and the patient will  be placed on his home medications at this time.  The patient will be  placed on GI as well as DVT prophylaxis and we will continue to follow  closely.  I appreciate gastroenterology's recommendations.      [name], DO  Electronically Signed     SM/MEDQ  D:  11/25/2007  T:  11/25/2007  Job:  [id number]

## 2011-02-16 NOTE — Group Therapy Note (Signed)
NAMESHERRILL, MCKAMIE NO.:  [id number]   MEDICAL RECORD NO.:  [id number]          PATIENT TYPE:  INP   LOCATION:  A204                          FACILITY:  [facility]   PHYSICIAN:  [name], DO    DATE OF BIRTH:  1966-04-22   DATE OF PROCEDURE:  09/16/2007  DATE OF DISCHARGE:                                 PROGRESS NOTE   SUBJECTIVE:  Patient is seen resting comfortably in his bed this a.m. Marland Kitchen  Patient states that his abdominal pain seems to be improved but still is  complaining of some bloody diarrhea.  GI continues to follow patient and  we appreciate the recommendations.  Patient's has been restarted on his  medications for his Crohn's disease.   PHYSICAL EXAMINATION:  VITAL SIGNS:  Temperature is 98.4, pulse 107.  Respirations 18, blood pressure 134/71, O2 saturation is 94% on room  air.  GENERALLY:  He is 45 years old, Caucasian male, he is pale, well-  developed, well-nourished, no acute distress.  HEENT:  Patient has nystagmus, no scleral icterus.  Head is  atraumatic/normocephalic.  HEART:  Tachycardic; no rubs, gallops or murmurs.  LUNGS:  Clear to auscultation bilaterally; no rales, rhonchi or  wheezing.  ABDOMEN:  Still had some diffuse tenderness, mild at this time  especially in the lower right quadrant.  Patient does have bowel sounds.  EXTREMITIES:  No clubbing, cyanosis or edema.   LABS:  White count is 11.9, hemoglobin 10.8, hematocrit 33.3, platelet  count is 435, sodium 137, potassium 3.7, chloride 106, CO2 is 26,  glucose 72, BUN 2, creatinine 0.97.   ASSESSMENT/PLAN:  1. Exacerbation of his Crohn's disease, patient has been restarted on      his Crohn's medications at this time and patient continues to be      followed by Gastroenterology.  Not sure if patient will get a      repeat colonoscopy secondary to his bloody stools, will defer to      gastroenterology at this time.  2. Anemia, will continue to follow his hemoglobin.  Also  await stool      study report, these studies are pending at this time.  Of note, his      esophagogastroduodenoscopy showed a small hiatal hernia, no obvious      episodes of any bleeding at this time.  Continue to follow patient      closely.      [name], DO  Electronically Signed     SM/MEDQ  D:  09/16/2007  T:  09/16/2007  Job:  [id number]

## 2011-02-16 NOTE — Op Note (Signed)
NAMECHRISTOPER, BUSHEY                ACCOUNT NO.:  [id number]   MEDICAL RECORD NO.:  [id number]          PATIENT TYPE:  AMB   LOCATION:  DAY                           FACILITY:  [facility]   PHYSICIAN:  Caro Hight, M.D.      DATE OF BIRTH:  [birth year]   DATE OF PROCEDURE:  07/10/2007  DATE OF DISCHARGE:                               OPERATIVE REPORT   PROCEDURE:  Ileocolonoscopy with cold forceps biopsy.   INDICATIONS FOR PROCEDURE:  Mr. Joslyn is a 45 year old male who was  diagnosed with Crohn's colitis in 2003.  He has a history of Remicade  infusion when he was initially diagnosed.  He is currently maintained on  Colazal.  He reports having flares every other month.  He felt like to  [place] enemas did not help.  He complains of bloody bowel movements  several times a day.  His hemoglobin dropped as low as 7.8, and he  required 2 units of packed red blood cells last week.   FINDINGS:  1. Pancolitis, most pronounced in the left colon.  Biopsies taken via      cold forceps.  Otherwise, no polyps, masses, or diverticula seen.  2. Normal distal terminal ileum and ileocecal valve.  3. No internal hemorrhoids.   DIAGNOSIS:  Crohn's colitis, poorly controlled with moderately active  disease.   RECOMMENDATIONS:  1. Mr. Mctavish should continue his Colazal.  He will need the addition      of prednisone 10 mg tablets and take as directed.  He should take      40 mg daily for 7 days, then 30 mg daily for 10 days, then 20 mg      daily for 7 days, and 10 mg daily for 7 days.  I will call Mr.      Ewart with the results of his biopsies.  He is also asked to      follow up this week for a repeat CBC, hepatic function panel, as      well as stool cultures.  When the results of these labs are known,      then we will initiate Imuran to see if we can get Mr. Nickson      symptoms controlled on steroid sparing therapy.  If after 2-3      months of the Imuran his symptoms are still not well  controlled and      he is requiring intermittent use of steroids, then Remicade will be      added.  2. He should follow a low residue lactose-free diet.  3. He should avoid aspirin and nonsteroidal anti-inflammatory drugs.  4. He has a followup appointment with Vickey Huger in 2 weeks.   MEDICATIONS:  1. Demerol 75 mg IV.  2. Versed 6 mg IV.   PROCEDURE TECHNIQUE:  Physical exam was performed.  Informed consent was  obtained from the patient after explaining the benefits, risks and  alternatives to the procedure.  The patient was connected to the monitor  and placed in the left lateral position.  Continuous oxygen was provided  by nasal cannula and IV medicine administered through an indwelling  cannula.  After administration of sedation and rectal exam, the  patient's rectum was intubated, and the scope was advanced under direct  visualization to the distal terminal ileum (approximately 20 cm above  the  IC valve).  The scope was removed slowly by carefully examining the  color, texture, anatomy, and integrity of the mucosa on the way out.  Cold forceps biopsies were obtained on the way out.  The patient was  recovered in endoscopy and discharged home in satisfactory condition.      Caro Hight, M.D.  Electronically Signed     SM/MEDQ  D:  07/10/2007  T:  07/10/2007  Job:  [id number]   cc:   Marval Regal, M.D.

## 2011-02-16 NOTE — Discharge Summary (Signed)
NAMEADEM, COSTLOW                ACCOUNT NO.:  [id number]   MEDICAL RECORD NO.:  [id number]          PATIENT TYPE:  INP   LOCATION:  IC06                          FACILITY:  [facility]   PHYSICIAN:  Girard Cooter, MD         DATE OF BIRTH:  1966/08/12   DATE OF ADMISSION:  03/17/2008  DATE OF DISCHARGE:  LH                               DISCHARGE SUMMARY   PRIORITY DISCHARGE SUMMARY   DATE OF DISCHARGE:  Yet to be determined.   DISCHARGE DIAGNOSES:  1. Recurrent Crohn's colitis.  2. Failure to achieve clinical remission on Imuran, Cimzia, and      prednisone.  3. Recurrent rectal bleeding likely secondary to a Crohn's flare.  4. History of left upper extremity deep vein thrombosis.  5. 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  6. History of pulmonary embolism.  7. History of dyslipidemia.  8. History of bipolar disorder.  9. Arthritis.  10.History of anemia.  11.Status post right hip replacement due to avascular necrosis with      steroid therapy.  12.Pancreatitis in 2005.  13.Status post appendectomy, 2005.  14.Chronic nystagmus.   CONSULTANTS:  Caro Hight, MD, [facility].   PROCEDURES:  Chest x-ray, which shows no active disease.  Please refer  to Dr. Zelphia Cairo consultation note for further procedures that have been  done in the recent hospital courses.   DISCHARGE LOCATION:  The patient is going to be going to [facility] under Dr. Nash Dimmer for anticipated  colonoscopy at [facility].   BRIEF HISTORY OF PRESENT ILLNESS:  Mr. Zabriskie came to the hospital as he  was again feeling that he had blood per rectum x3 associated with  diarrhea, associated with some vomiting.  He had no fevers or chills.  He had a fall yesterday as he was getting out of the ambulance.  CT scan  of the head was negative.  Here in the emergency room, he again had some  bright blood per rectum.   His hemoglobin did not drop very much.  Please  refer to Dr. Tawni Pummel consultation note from March 17, 2008, for  sequential events that led prior to events yesterday.   DISCHARGE MEDICATIONS:  1. Protonix 40 mg IV b.i.d.  2. Flagyl 500 mg IV q.6 hours.  3. Levaquin 500 mg IV daily.  4. Diflucan 100 mg IV daily.  5. Depakote 1500 mg daily.  6. Risperdal 4 mg daily.  7. Nystatin cream b.i.d. affected areas.  8. Solu-Medrol 60 mg IV q.6 hours.  9. Phenergan 12.5 mg IV q.6 hours as needed.  10.Zofran 4 mg IV q.4 hours as needed.  11.Lipitor 10 mg daily.  12.Trazodone 100 mg daily.  13.Poly-Iron 150 mg daily.  14.Azofibrin 150 mg daily.  15.Calcium carbonate 600 mg b.i.d.  16.Zyrtec 10 mg daily.  17.Bupropion 150 mg daily.   Please refer to discharge physical examination and further  recommendations from Dr. Zelphia Cairo consultation note.   DISCHARGE CONDITION:  Stable  for transfer.      Girard Cooter, MD  Electronically Signed     RR/MEDQ  D:  03/17/2008  T:  03/17/2008  Job:  [id number]

## 2011-02-16 NOTE — Group Therapy Note (Signed)
NAMEGAMBLE, ENDERLE NO.:  [id number]   MEDICAL RECORD NO.:  [id number]          PATIENT TYPE:  INP   LOCATION:  A201                          FACILITY:  [facility]   PHYSICIAN:  Girard Cooter, MD         DATE OF BIRTH:  July 23, 1966   DATE OF PROCEDURE:  DATE OF DISCHARGE:                                 PROGRESS NOTE   Full history and physical examination and progress notes were reviewed.  The patient was examined at the bedside.  He denies any chest pain or  shortness of breath.  Left arm is swollen still.  There are no other  complaints.   LABORATORY DATA:  INR 1.3, CBC:  White count 9.4, hemoglobin 9.6,  hematocrit 27.8.  Protein S total of 111.  Factor V Leiden deficiency  gene mutation is negative.   ASSESSMENT/PLAN:  1. Upper extremity deep vein thrombosis.  2. History of Crohn disease.  3. Anemia.   The patient is doing well.  I would defer to discharge him today however  because his INR is subtherapeutic still, and the patient is at high risk  for pulmonary embolism.  I would wait until his INR is stabilized prior  to discharge.      Girard Cooter, MD  Electronically Signed     RR/MEDQ  D:  02/09/2008  T:  02/09/2008  Job:  [id number]

## 2011-02-16 NOTE — Assessment & Plan Note (Signed)
NAMEMarland Lane  [name], [name]                 CHART#:  [id number]   DATE:  08/29/2007                       DOB:  1966-09-18   CHIEF COMPLAINT:  Nausea and vomiting, bloody diarrhea, Crohn's colitis.   HISTORY OF PRESENT ILLNESS:  The patient is a 45 year old Caucasian  male.  He has a history of Crohn's colitis.  He had a recent  ileocolonoscopy on July 10, 2007, by Dr. Stann Mainland.  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  He had a normal distal  terminal ileum and ileocecal valve.  He was felt to have poorly  controlled with moderately active disease.  He has been on Colazal as  well as the addition of prednisone on follow-up office visit on July 25, 2007, he was started on Imuran.  His last CBC was on August 17, 2007.  He had a white blood cell count of 11.2, hemoglobin 12.2,  hematocrit 41.2, and platelet count of 351.  He is on iron 325 mg b.i.d.  He complains of six days of nausea and vomiting every time he tries to  eat.  He has been having multiple bloody stools per day.  He denies any  NSAIDS or Naproxen use.  He is having generalized abdominal pain and  rates it 10/10 at worst.  He has had good relief previously with  Vicodin.  He complains of chills and low grade fever.   CURRENT MEDICATIONS:  See the list from October 28, 2006.   ALLERGIES:  PENICILLIN, IBUPROFEN, CODEINE, ASPIRIN.   OBJECTIVE:  VITAL SIGNS:  Weight 174 pounds, height 66 inches,  temperature 98.6, blood pressure 110/70, and pulse 72.  GENERAL:  The patient is a pale-appearing Caucasian male who is alert  and oriented, pleasant, and cooperative in no acute distress.  HEENT:  Sclerae clear and anicteric.  Conjunctivae pink.  He does have  nystagmus.  CHEST:  Heart regular rate and rhythm, normal S1 and S2.  ABDOMEN:  Positive bowel sounds x4.  No bruits auscultated.  Soft,  mildly distended.  He does have significant tenderness to the entire  abdomen on deep palpation.  There is no  rebound tenderness or guarding.  EXTREMITIES:  Without clubbing or edema bilaterally.   ASSESSMENT:  The patient is a 45 year old male with history of Crohn's  colitis.  Last ileocolonoscopy by Dr. Stann Mainland on July 10, 2007, showed  poorly controlled moderately active disease.  He has been started on  Imuran about one month ago.  He has had six days of nausea, vomiting,  and abdominal pain.  He is tapering off of prednisone and continues on  Colazal.  Discussed with Dr. Stann Mainland. May have active Crohn's disease,  viral illness, and low likelihood of bacterial gastroenteritis or side  effects from Imuran.   PLAN:  1. Stat CBC, CMP, and sed rate.  2. Continue Imuran, Colazal, and prednisone for now.  3. Continue iron 325 mg b.i.d.  4. Vicodin 5/500 mg one p.o. q.4-6 hours p.r.n. severe pain #20 with      no refills.  5. Phenergan 25 mg 1/2 to 1 p.o. q.6-8 hours p.r.n. nausea and      vomiting #10 with no refills.  6. Pending labs, will increase Imuran to 17m daily.  7. Start Remicade Infusions  after negative PPD confirmed.  8. Stools for Culture and O&P.       Vickey Huger, N.P.  Electronically Signed     Caro Hight, M.D.  Electronically Signed    KJ/MEDQ  D:  08/29/2007  T:  08/29/2007  Job:  [id number]

## 2011-02-16 NOTE — H&P (Signed)
NAMERODRICK, PAYSON NO.:  [id number]   MEDICAL RECORD NO.:  [id number]          PATIENT TYPE:  INP   LOCATION:  A325                          FACILITY:  [facility]   PHYSICIAN:  Anselmo Pickler, DO    DATE OF BIRTH:  [birth year]   DATE OF ADMISSION:  01/31/2008  DATE OF DISCHARGE:  LH                              HISTORY & PHYSICAL   The patient is a 45 year old male who presented with chief complaint of  left arm swelling.   HISTORY OF PRESENT ILLNESS:  Patient is a 45 year old male who presented  to the [facility] Emergency Room with extreme left upper extremity  swelling.  He stated that he had a Port-A-Cath placed on his left side  on January 22, 2008, and he has noticed progressive swelling since then.  It has been insidious, continuous and persistent.  There has also been  some pain associated with it as well.  Characterized as achy and sore.  Patient also has a significant history of Crohn's disease.   PAST MEDICAL HISTORY:  1. Hyperlipidemia.  2. Asthma.  3. GERD.  4. Bipolar disorder.  5. Nystagmus.  6. Crohn's disease.   PAST SURGICAL HISTORY:  1. Significant for appendectomy secondary to subacute pancreatitis in      2005.  2. Right inguinal hernia repair.  3. Right hip replacement due to avascular process from prednisone      therapy.   ALLERGIES:  ASPIRIN, CODEINE, PENICILLIN WITH M AND HYDROMORPHONE, ALSO  TO ULTRAM WITH LOCALIZED SWELLING.   He is on several medications.  The first one is:  1. Azathioprine 100 mg every morning.  2. Bupropion 150 mg every morning.  3. Calcium 600 mg 1 tablet in the morning and 1 tablet at night.  4. Depakote ER 1500 mg at bedtime.  5. Iron 150 mg twice daily.  6. Lasix 40 mg once daily.  7. Lipitor 10 mg at bedtime.  8. Omeprazole 20 mg every morning.  9. Risperdal 8 mg every evening.  10.Trazodone 200 mg at bedtime.  11.Zyrtec 10 mg every morning.  12.Benztropine 1 mg once daily.  13.Prednisone as  needed.  14.Naprosyn as needed.   REVIEW OF SYSTEMS:  CONSTITUTIONAL:  Negative for weight loss, appetite  changes or weakness, chills or fever.  Negative for eye pain, diplopia.  Patient does have chronic nystagmus.  EARS, NOSE, MOUTH AND THROAT:  Negative for ear pain, hearing loss, hoarseness, rhinorrhea, sinus  pressure or sore throat.  CARDIOVASCULAR:  Negative for chest pain or  palpitations.  CHEST:  Negative for cough, dyspnea, wheezing or  tachypnea.  GASTROINTESTINAL:  Negative for nausea, vomiting, diarrhea,  or abdominal pain.  GU:  Negative for dysuria, urgency or dribbling.  MUSCULOSKELETAL:  Positive for stiffness and swelling of the left  extremity.  SKIN:  Negative for pruritus, rash, or abrasions.  NEURO:  Negative for headache or confusion.  METABOLIC EXAM: Negative for  excessive thirst and cold.  HEMATOLOGIC:  Negative for anemia.   His vitals are as follows:  Blood pressure 126/70, pulse rate 118,  respirations  16 and temperature 98.8.  Pulse oximetry 100%.  CONSTITUTIONAL:  This is a well developed, well nourished 45 year old  Caucasian male who is in no acute distress.  HEAD/FACE:  Normocephalic.  Atraumatic.  Eyes:  Positive chronic  nystagmus.  Ears:  TMs visualized bilaterally.  Throat:  Within normal  limits.  Teeth in poor repair.  NECK:  Supple, no lymphadenopathy noted.  CARDIOVASCULAR:  Tachy sinus.  No murmur, rub or gallop.  RESPIRATIONS:  Breath sounds are clear to auscultation bilaterally.  No  rales, wheezes or rhonchi.  ABDOMEN:  Soft, nontender.  No masses, no hepatosplenomegaly or rebound.  EXTREMITIES:  Full range of motion.  Positive swelling of the left  extremity all the way up to the armpit.  No lymphadenopathy noted.  NEURO:  Cranial nerves II-XII grossly intact.  SKIN:  Pale, no rashes or petechiae.   He had a venous ultrasound done and it showed extensive deep vein  thrombosis of the left upper extremity extending into the left  jugular  vein.  Findings discussed with Dr. Nira Retort.  I was on call to admit the  patient with a diagnosis of:  1. Deep vein thrombosis extending into the left jugular vein.  Will      admit patient to service of [facility].  We will start patient on IV      heparin as well as anticoagulation therapy per pharmacy's protocol.      We will also put him on telemetry.  Due to his continued flares of      Crohn's disease, we will go ahead and have GI consulted in case      patient does have some rectal bleeding.  They can follow along with      him and alter his medications if need be.  Discussed case with Dr.      Nira Retort.  Will consult him.  We will get a CT to see if this clot      has extended to the right side as well.  We will keep him elevated      and apply cool or warm compresses for patient's comfort.  We will      place him on all of his home medications and we will do GI      prophylaxis as well.  We will continue to monitor him.  Discussed      at length with patient the complications of such a clot, that this      could happen at any time and that it does increase his risk for      stroke and myocardial infarction.  Patient states understanding and      he understands the MPE. We will continue to monitor him and change      therapy as necessary.      Anselmo Pickler, DO  Electronically Signed     CB/MEDQ  D:  01/31/2008  T:  01/31/2008  Job:  [id number]

## 2011-02-16 NOTE — Op Note (Signed)
NAMEDARRY, KELNHOFER NO.:  [id number]   MEDICAL RECORD NO.:  [id number]          PATIENT TYPE:  INP   LOCATION:  A304                          FACILITY:  [facility]   PHYSICIAN:  Jamesetta So, M.D.  DATE OF BIRTH:  November 26, 1965   DATE OF PROCEDURE:  01/24/2008  DATE OF DISCHARGE:                               OPERATIVE REPORT   PREOPERATIVE DIAGNOSIS:  Intractable Crohn disease.   POSTOPERATIVE DIAGNOSIS:  Intractable Crohn disease.   PROCEDURE:  Port-A-Cath insertion.   SURGEON:  Jamesetta So, MD   ANESTHESIA:  MAC.   INDICATIONS:  The patient is a 45 year old white male with multiple  medical problems, including intractable Crohn disease who presents for a  Port-A-Cath insertion.  The risks and benefits of the procedure were  fully explained to the patient, he gave informed consent.   PROCEDURE NOTE:  The patient was placed in the Trendelenburg position  after left upper chest was prepped and draped using the usual sterile  technique with Betadine.  Surgical site confirmation was performed.  A  1% Xylocaine was used for local anesthesia.   A transverse incision was made below the left clavicle.  Subcutaneous  pocket was then formed.  A needle was advanced into the left subclavian  vein using the Seldinger technique without difficulty.  Guidewire was  then advanced into the right atrium under fluoroscopic guidance.  An  introducer and peel-away sheath were placed over the guidewire.  The  catheter was then inserted through the peel-away sheath, and the peel-  away sheath was removed.  The catheter was attached to the port, and the  port placed in subcutaneous pocket.  Adequate positioning was confirmed  by fluoroscopy.  The port was flushed with 3000 units of heparin.  The  subcutaneous layers were reapproximated using a 3-0 Vicryl interrupted  suture.  The skin was closed using a 4-0 Vicryl subcuticular suture.  A  Dermabond was then applied.   All  tape and needle counts were correct at the end of procedure.  The  patient was transferred to the PACU in stable condition.  A chest x-ray  will be performed at that time.   COMPLICATIONS:  None.   SPECIMEN:  None.   BLOOD LOSS:  Minimal.      Jamesetta So, M.D.  Electronically Signed     MAJ/MEDQ  D:  01/24/2008  T:  01/25/2008  Job:  [id number]   cc:   Caro Hight, M.D.  [address]  [place] , [place] [address]   Dr. Bridget Hartshorn

## 2011-02-16 NOTE — Group Therapy Note (Signed)
NAMERAMEZ, ARRONA NO.:  [id number]   MEDICAL RECORD NO.:  [id number]          PATIENT TYPE:  INP   LOCATION:  A204                          FACILITY:  [facility]   PHYSICIAN:  Anselmo Pickler, DO    DATE OF BIRTH:  [birth year]   DATE OF PROCEDURE:  09/15/2007  DATE OF DISCHARGE:                                 PROGRESS NOTE   The patient was seen today resting comfortably in bed.  He states that  he feels a little bit better than he did last night.  He was seen by GI  and their note is appreciated and recommendations have been made.  The  patient has also been started on several medications to help with his  Crohn's disease.  We will go ahead and continue to monitor him as well.   PHYSICAL EXAMINATION:  VITAL SIGNS:  98.3, pulse 94, respirations, 16,  blood pressure 121/83.  GENERAL:  This is a 45 year old Caucasian male who is well developed,  well nourished, in no acute distress.  SKIN:  Has some pallor to it, but it is warm and nonmoist.  HEART:  Regular rate and rhythm.  LUNGS:  Clear to auscultation bilaterally.  ABDOMEN:  Some lower abdominal diffuse tenderness.  EXTREMITIES:  Positive pulses.  No edema or cyanosis noted.   LABS AS FOLLOWS:  CBC white count of 12.0, hemoglobin of 11.8,  hematocrit 36.7, platelet count 498, his RBC 3.99.  INR is 1.  Chemistry:  Sodium 141, potassium 3.4, chloride 107, glucose 31, BUN 76,  and creatinine 2.   PLAN:  1. Crohn's exacerbation.  2. Possible gastrointestinal bleed.  3. Hypokalemia.   PLAN:  1. Will go ahead and follow with GI on recommendations that they have      regarding the treatment of his Crohn's disease.  2. We will continue with the CBCs.  3. For hypokalemia will go ahead and replace and follow his labs in      the morning.      Anselmo Pickler, DO  Electronically Signed     CB/MEDQ  D:  09/15/2007  T:  09/15/2007  Job:  [id number]

## 2011-02-16 NOTE — Group Therapy Note (Signed)
NAMEDOCTOR, SHEAHAN NO.:  [id number]   MEDICAL RECORD NO.:  [id number]          PATIENT TYPE:  INP   LOCATION:  IC09                          FACILITY:  [facility]   PHYSICIAN:  Audria Nine, M.D.DATE OF BIRTH:  [birth year]   DATE OF PROCEDURE:  04/06/2008  DATE OF DISCHARGE:                                 PROGRESS NOTE   SUBJECTIVE:  The patient was admitted last night with GI bleed.  Symptoms were consistent with an acute exacerbation of his Crohn's  disease.  The patient's bleeding was suspected to be likely exacerbated  by him being anticoagulated with Coumadin for pulmonary embolus.  This  morning the patient has had no more bleeding episodes.  He has no more  vomiting.  He did have clear liquids this morning, which he tolerated  very well.  He had some very mild abdominal pain, but otherwise no other  concerns.  The patient is now scheduled for transfer to [facility] tomorrow.   OBJECTIVE:  Conscious, alert, comfortable, not in acute distress, very  pleasant.  VITAL SIGNS:  Blood pressure is improved somewhat to 101/52, respiratory  rate of 16, temperature 98.5.  Her pulse was ranging between 100-110.  Oxygen saturation 98% on 2 L.  HEENT:  Normocephalic, atraumatic.  Oral mucosa was moist, with no  exudates.  NECK:  Supple.  No JVD or lymphadenopathy.  LUNGS:  Clear clinically,  good air entry bilaterally.  HEART:  S1 and S2, tachycardiac, no S3, S4, gallops or rubs.  ABDOMEN:  Soft, nontender.  Bowel sounds were positive.  No masses  palpable.  The patient had no significant rebound or rigidity.  EXTREMITIES:  No edema was noted.  SKIN:  The patient has some maculopapular rash, which is chronic.   LABORATORY/DIAGNOSTIC DATA:  Hemoglobin this morning is 10.4, hematocrit  is 31, platelet count was 102.  PT is 18.9, INR is 1.5, sodium 138,  potassium 4.0, chloride of 107, CO2 is 24, glucose 87, BUN of five,  creatinine was 0.81, calcium  7.3.   ASSESSMENT AND PLAN:  1. Acute gastrointestinal bleed, likely secondary to Crohn's disease.  2. Possible acute exacerbation of Crohn's disease.  3. Hypotension, likely related to gastrointestinal bleed versus      adrenal insufficiency.  4. Bipolar disorder.  5. History of left internal jugular vein deep venous thrombosis with      pulmonary embolism on anticoagulation with Coumadin.  However, the      anticoagulation was reversed.  6. Degenerative joint disease.  7. Chronic skin rash.   PLAN:  1. The patient's bleeding has stopped and his H and H is stable.      There is no indication for any blood transfusion at this time.  The      patient will be transferred, hopefully,  to [facility]      tomorrow under Dr. Verdene Lennert.  The patient is being planned for GI      surgery.  Will continue to monitor him very closely in the ICU.  2. The patient's H and H  is stable.  Will continue checks q.6.  3. History of DVT with pulmonary embolus on anticoagulation.  This has      been discontinued.  I think this is reasonable, but it is noted      that the patient developed deep venous thrombosis when he had a      central line placed.  He is now on Coumadin for about 2-1/2 months.      You could make a good case that perhaps his risk for DVT and      another pulmonary embolus may be much less, especially in the      absence of the central line, and obviously a clear etiology for the      pulmonary embolus.  4. I agreed to continue holding Lasix, as his blood pressure is kind      of borderline.   We appreciate gastroenterology input at this time.   PLAN:  Plan is to have the patient transferred as soon as possible to  the tertiary center for his bowel surgery.  There is no indication for  antibiotic therapy at this time.  Total critical care time spent in the  care of this patient, excluding procedures, was 1 hour.   DISPOSITION:  Likely to [facility] tomorrow  morning.      Audria Nine, M.D.  Electronically Signed     AM/MEDQ  D:  04/06/2008  T:  04/06/2008  Job:  [id number]

## 2011-02-16 NOTE — Discharge Summary (Signed)
NAMELUGENE, BEOUGHER NO.:  [id number]   MEDICAL RECORD NO.:  [id number]          PATIENT TYPE:  INP   LOCATION:  A201                          FACILITY:  [facility]   PHYSICIAN:  Girard Cooter, MD         DATE OF BIRTH:  July 20, 1966   DATE OF ADMISSION:  01/31/2008  DATE OF DISCHARGE:  05/10/2009LH                               DISCHARGE SUMMARY   DISCHARGE DIAGNOSES:  1. Pulmonary embolism.  2. Deep vein thrombosis extending into the left jugular vein.  3. Chronic Crohn's disease.  4. 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  5. Bipolar disorder, chronic.  6. History of gastroesophageal reflux disease, chronic, stable.  7. Hyperlipidemia, chronic, stable.  8. Hyperlipidemia, chronic, stable.  9. History of smoking in the past, chronic, stable.  10.History of asthma.  11.History of appendectomy in 2005.  12.History of subcutaneous pancreatitis in 2005.  13.History of inguinal hernia repair.   DISCHARGE CONDITION:  Stable.   PROCEDURES:  1. The patient had a CT angio dated January 31, 2008, which showed      positive study for pulmonary embolism, likely source is left      subclavian Port-A-Cath which appears to have contiguous embolus      extending from the entry site into the left subclavian vein into      the SVC.  2. Bilateral lower lobe pulmonary infarcts.  3. Micro nodularity of the upper lobes bilaterally, although this is      typically associated with infection.  4. The patient had a venous ultrasound imaging January 31, 2008, that      showed extensive deep vein thrombosis of the left upper extremity      extending into the jugular vein.  5. The patient underwent a Port-A-Cath removal on Feb 02, 2008, by Dr.      Arnoldo Morale which was successful.   CONSULTANTS:  1. Gaston Islam. Tressie Stalker, MD  2. Bridgette Habermann, MD  3. Dr. Arnoldo Morale.   BRIEF HISTORY OF PRESENT ILLNESS:  Mr. Reiley is a 45 year old male  presented on January 31, 2008, with left arm swelling.  As he had a  Port-A-  Cath placement on the left side on January 22, 2008, he had been noticing  progressive swelling since that time period accompanied by pain.  He was  found to have a DVT extending into the left jugular vein and CT  angiography also showed PE.  He was admitted to medical telemetry floor  and started on IV heparin.  A gastroenterology consultation was  requested.  A pulmonary consultation was also requested.   The patient underwent a Port-A-Cath removal by Dr. Arnoldo Morale secondary to  his DVTs which was thought to be the cause.  Upon removal there were no  signs of infection or separation.  The patient also has a family history  of deep vein thrombosis thus a hypercoagulable panel was sent out.  Recommendations were made by Dr. Tressie Stalker for treatment for 3-6 months.  Hypercoagulable panel showed a protein S total to be 111 within normal  limits, factor V Leiden  gene mutation to be negative.  Throughout  hospital course the patient remained hemodynamically stable.  He did not  show any signs of compromise.  His pulse oximetry remained within normal  limits.  He was managed by Coumadin and Lovenox for a goal INR between 2  and 3.   The patient will be going home with home health with Lovenox and  Coumadin until his INR is between 2 and 3.  The patient is recommended  to check his INRs every 3 days and then titrate accordingly per his  primary care doctor.   DISCHARGE MEDICATIONS:  1. Lovenox 80 mg daily twice a day for up to 17 days until his INR is      at goal.  2. Coumadin 5 mg daily, dose should be adjusted for a goal INR between      2 and 3.  The patient is to get his INR checked every third day and      results to be called into primary care doctor and to Dr. Stann Mainland.  3. Omeprazole 20 mg p.o. b.i.d.  4. Bupropion-sk 150 mg daily.  5. Zyrtec 10 mg p.o. daily.  6. Calcium 600 D b.i.d.  7. Azathioprine, note that this medication was stopped by      gastroenterology.  8.  Poly-Iron 150 mg b.i.d.  9. Lasix 40 mg daily.  10.Potassium 40 mEq daily.  11.Depakote ER 500 mg 3 tablets daily at bedtime.  12.Risperdal 4 mg 2 tablets daily at bedtime.  13.Lipitor 10 mg daily.  14.Trazodone 100 mg 2 tablets at bedtime.  15.Note that new medications that the patient should be on is Imuran      (azathioprine) 150 mg p.o. daily and Cimzia 400 subcu every 4      weeks.  16.The patient is to stop Flagyl.  17.Prednisone 5 mg daily.   Note that medications should be reconsidered and doses should be checked  and adjusted according to the patient's progress as an outpatient at  physician's discretion.  Note that Lovenox should essentially be stopped  upon a goal INR being reached.  This needs to be confirmed.  The  importance of checking INRs, risks of bleeding and complications  completely presented to the patient; benefits of Coumadin therapy also  presented to the patient.  The patient essentially should continue  anticoagulation for 3 months.  The patient is to follow up with Dr.  Stann Mainland in 1 month and primary care doctor in 3-4 days.   DISCHARGE CONDITION:  Stable.   DIET:  The patient is to resume his preadmission diet as tolerated, to  present to primary care doctor and gastroenterologist with any  complications due to his diet or any flares of his Crohn's disease,  diarrhea, bloody black stools, hematemesis, thoroughly explained to the  patient.      Girard Cooter, MD  Electronically Signed     RR/MEDQ  D:  02/11/2008  T:  02/11/2008  Job:  [id number]   cc:   Marval Regal, Dr

## 2011-02-16 NOTE — Group Therapy Note (Signed)
NAMEDAVED, MCFANN NO.:  [id number]   MEDICAL RECORD NO.:  [id number]          PATIENT TYPE:  INP   LOCATION:  A204                          FACILITY:  [facility]   PHYSICIAN:  [name], DO    DATE OF BIRTH:  05-10-1966   DATE OF PROCEDURE:  09/17/2007  DATE OF DISCHARGE:                                 PROGRESS NOTE   SUBJECTIVE:  The patient is still resting comfortably in his bed. The  patient has been having multiple loose stools and has developed a  erythematous rash on his buttocks. Medications have been ordered for  this. The patient continues to have diarrhea, but he states that he has  not been seeing any blood as of the last few episodes. The patient  continues to be followed by gastroenterology regarding his medications  for his Crohn's disease.   OBJECTIVE:  VITAL SIGNS:  Temperature 98.6, pulse 94, respirations 20,  blood pressure 125/77. The patient is saturating 98% on room air.  GENERAL:  This is a 45 year old Caucasian male. He is pale, well  developed, well nourished, and in no acute distress.  HEART:  Tachycardic. No murmurs, rubs, or gallops.  LUNGS:  Clear to auscultation bilaterally. No rales, rhonchi, or  wheezing.  ABDOMEN:  Slight tenderness is still present. The patient __________  bowel sounds. No rigidity or guarding.  EXTREMITIES:  No cyanosis, clubbing, or edema.   LABORATORY DATA:  White count 9.3, hemoglobin 10.2, __________ 31.7,  platelets 390, sodium 142, potassium 3.6, chloride 105, CO2 26, glucose  96, BUN 2, creatinine 0.85.   ASSESSMENT AND PLAN:  1. Exacerbation of Crohn's disease. Gastroenterology continues to      adjust his medications at this time.  2. Diarrhea. The patient continues to have episodes of diarrhea. It      seems to be decreasing at this time. His medications are still      being adjusted. We will continue IV hydration and supportive care.  3. Anemia. This seems to be stable at this time. I will  continue to      follow his hematocrit and hemoglobin.  4. We are still awaiting stool collection for stool culture at this      time.      [name], DO  Electronically Signed     SM/MEDQ  D:  09/17/2007  T:  09/18/2007  Job:  [id number]

## 2011-02-16 NOTE — Group Therapy Note (Signed)
NAMEBEULAH, MATUSEK NO.:  [id number]   MEDICAL RECORD NO.:  [id number]          PATIENT TYPE:  INP   LOCATION:  A201                          FACILITY:  [facility]   PHYSICIAN:  Anselmo Pickler, DO    DATE OF BIRTH:  09-22-1966   DATE OF PROCEDURE:  02/02/2008  DATE OF DISCHARGE:                                 PROGRESS NOTE   The patient seen today.  States that he did not get much sleep last  night.  Does not admit to any more swelling of his arms.  Plan is to  pull out the Port-A-Cath today, Dr. Arnoldo Morale will do that.   PHYSICAL EXAMINATION:  VITAL SIGNS:  Temperature 98.2, pulse 90,  respirations 20 blood pressure 120/79.  GENERAL:  The patient is legally blind.  HEENT:  He has chronic nystagmus.  HEART:  Regular rate and rhythm.  LUNGS:  Clear to auscultation bilaterally.  ABDOMEN:  Soft, nontender, nondistended.  EXTREMITIES:  Positive left arm swelling extending all the way up to the  mid shoulder.   LABORATORY DATA:  His labs for today are as follows.  White count 10,  hemoglobin 10.5, hematocrit 31, platelet count of 281.  Sodium 137,  potassium 3.9, chloride 99, CO2 29, glucose 102, BUN 4 and creatinine  0.85.   ASSESSMENT AND PLAN:  1. Extensive deep venous thrombosis of the right arm.  2. Pulmonary pulmonary emboli secondary to #1.  3. Crohn's disease.  4. Anemia.   PLAN:  I have discussed case with Dr. Tressie Stalker and Dr. Luan Pulling.  We  will remove the Port-A-Cath and try to stabilize the patient's  hemoglobin and hematocrit.  Once he is stable, we will consider  contacting interventional cardiology for any more input that they may  have, but we will continue the patient on Lovenox.  I have also talked  extensively with GI who are treating him for his Crohn's disease as  well.  Will continue to follow him very closely and change therapy as  needed.      Anselmo Pickler, DO  Electronically Signed     CB/MEDQ  D:  02/02/2008  T:  02/02/2008   Job:  [id number]

## 2011-02-16 NOTE — Consult Note (Signed)
NAMESALOME, [name]                ACCOUNT NO.:  [id number]   MEDICAL RECORD NO.:  [id number]          PATIENT TYPE:  INP   LOCATION:  A204                          FACILITY:  [facility]   PHYSICIAN:  R. Garfield Cornea, M.D. DATE OF BIRTH:  03-30-1966   DATE OF CONSULTATION:  09/15/2007  DATE OF DISCHARGE:                                 CONSULTATION   REQUESTING PHYSICIAN:  Dr. Anselmo Pickler with [facility].   REASON FOR CONSULTATION:  Crohn's colitis and GI bleed.   HISTORY OF PRESENT ILLNESS:  [name] is a 45 year old Caucasian gentleman  with history of Crohn's colitis diagnosed back in 2003 by Dr. Algis Greenhouse,  who has been followed fairly regularly recently due to refractory  Crohn's disease.  He presented to the emergency department by the  recommendation of his psychiatrist, who he was seeing yesterday.  He was  complaining of lightheadedness.  He had tachycardia with heart rate in  the 120s.  He felt weak.  He was last seen in our practice on August 29, 2007.  His Crohn's colitis has been fairly active over the past  several months.  He underwent an ileocolonoscopy on October 6 by Dr.  Stann Mainland.  He had pancolitis more pronounced in the left colon.  Random  biopsies were consistent with IBD.  He had a normal distal terminal  ileum and ICV, no internal hemorrhoids.  He has been maintained on  Colazal 2.25 g t.i.d.  Recently, he has been on prednisone and is  currently down to 10 mg daily as part of a taper.  In October, Imuran  was added initially at 50 mg daily and he is now 75 mg daily.  Plans for  Remicade were in process.  The patient has not had his PPD done yet.  Stool studies were ordered, but these have not been done yet either.   The patient states he has been having so many stools he cannot keep up  with it.  All of his stools are bloody consisting of bright red blood.  He has a lot of abdominal cramping and sharp pain in his lower  midabdomen.  He has had nausea, but  no vomiting.  He states he has been  running a low-grade temperature around 101 at home.  He denies any  recent antibiotic use.  No heartburn, dysphagia or odynophagia.  Sometimes his stools are very dark.  He says he has lost about 10 pounds  since October.  He has had some dizziness and lightheadedness.  He has  rare intermittent sharp chest pain in the mid substernal region which is  fleeting, no shortness of breath associated with it.  No diaphoresis.  The last time was last Sunday.  He denies any dysuria or hematuria.   MEDICATIONS AT HOME:  1. Combivent q.i.d.  2. Darvocet p.r.n.  3. Albuterol inhaler p.r.n.  4. Wellbutrin SR 150 mg b.i.d.  5. Omeprazole 20 mg b.i.d.  6. Depakote ER 1 g nightly.  7. Risperdal 6 mg nightly.  8. Trazodone 100 mg nightly.  9. Lipitor 10 mg nightly.  10.Colazal 2.25 g t.i.d.  11.Prednisone 10 mg daily on a taper.  12.Zyrtec 10 mg daily.  13.Imuran 75 mg daily.   ALLERGIES:  PENICILLIN, CODEINE, IBUPROFEN, ASPIRIN and LITHIUM.   PAST MEDICAL HISTORY:  1. Crohn's colitis diagnosed by Dr. Algis Greenhouse in 2003.  He was followed      by Dr. Tamala Julian and then subsequently by Dr. Laural Golden in 2005.  He did      fairly well up until the last few months.  He has had prior      appendectomy in 2005 by Dr. Arnoldo Morale for subacute appendicitis.  2. He had a right inguinal hernia repair.  3. Hypercholesterolemia.  4. Asthma.  5. GERD.  6. Bipolar disorder.  7. Insomnia.  8. His last EGD was in April 2005 by Dr. Laural Golden and he had erosive      esophagitis with changes suggestive of the beginning of Barrett's      esophagus.  He had a small sliding hiatal hernia, antral erythema      and it was suggested that he follow up in April 2009 for EGD.  Last      colonoscopy is outlined above.  9. 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   FAMILY HISTORY:  Negative for colorectal cancer, lower  GI tract  problems.   SOCIAL HISTORY:  He resides in a group home, denies tobacco, alcohol or  drug use.   REVIEW OF SYSTEMS:  See HPI for GI, Constitutional, Cardiopulmonary and  Genitourinary.   PHYSICAL EXAMINATION:  VITAL SIGNS:  Temperature 98.3, pulse 94,  respirations 16, blood pressure 121/83.  Height 66 inches.  Weight 78.9  kg.  GENERAL:  A pleasant, well-nourished, well-developed, pale-appearing  Caucasian male in no acute distress.  SKIN:  Warm and dry, no jaundice.  HEENT:  Sclerae are anicteric.  Oropharyngeal mucosa moist and pink.  CHEST:  Lungs are clear to auscultation.  CARDIAC:  Exam reveals a regular rate and rhythm.  No murmurs, rubs, or  gallops.  ABDOMEN:  Positive bowel sounds.  Abdomen is slightly obese, but very  soft and symmetrical.  He has moderate tenderness in the low midabdomen  and epigastrium to deep palpation.  No rebound tenderness or guarding.  No abdominal bruits or hernias.  No organomegaly or masses.  EXTREMITIES:  No edema.   LABORATORY DATA:  Sodium 141, potassium 3.4, BUN 2, creatinine 1,  glucose 76.  White count 9300.  Hemoglobin on admission was 8.9; he has  received 2 units of packed red blood cells and the hemoglobin is  appropriately 10.9 and platelets 458,000.  INR 1.   IMAGING STUDY:  Acute abdominal series revealed air-filled loops of  small bowel, but no significant distention.   IMPRESSION:  The patient is a pleasant 45 year old gentleman with  refractory Crohn's colitis, who presents with bloody diarrhea, weakness,  lightheadedness and basically symptomatic anemia.  He also has abdominal  pain.  He history been on Colazal 2.25 g t.i.d., prednisone 10 mg daily,  Imuran 75 mg daily, which was started in October 2008.  He has really  not gained remission and has actually been in the process of being  arranged for Remicade.  He denies any recent antibiotic use.  We cannot  rule out possibility of superimposed infection on top  of his Crohn's  disease.  Biopsies back in October 2008 were  consistent with his  diagnosis of Crohn's disease.  I suspect symptoms secondary to  refractory Crohn's colitis.   RECOMMENDATIONS:  1. Supportive measures.  2. Follow hemoglobin.  3. We will resume his home medications including prednisone, Colazal      and Imuran.  4. PPD.  5. Stool studies.  6. To discuss further with Dr. Gala Romney and further recommendations to      follow.      Neil Crouch, P.ABridgette Habermann, M.D.  Electronically Signed    LL/MEDQ  D:  09/15/2007  T:  09/15/2007  Job:  [id number]   cc:   Anselmo Pickler, DO   [facility]   [place], [place] Marval Regal MD

## 2011-02-16 NOTE — Group Therapy Note (Signed)
NAMEAMIN, FORNWALT NO.:  [id number]   MEDICAL RECORD NO.:  [id number]          PATIENT TYPE:  INP   LOCATION:  A201                          FACILITY:  [facility]   PHYSICIAN:  Anselmo Pickler, DO    DATE OF BIRTH:  05-Dec-1965   DATE OF PROCEDURE:  02/08/2008  DATE OF DISCHARGE:                                 PROGRESS NOTE   The patient seen today, doing well.  He has noticed some decrease in  swelling of his left arm.  On talking with home health care to see  waking get him set up for home health care, we will go ahead and get  GI's approval if not by Monday once he is more therapeutic on his  anticoagulation.  The patient will also receive his Cimzia injection  today.  This will give [place] a couple of days to see how he reacts and to  monitor his blood level while he is increasing on his anticoagulants.  He is also hypokalemic today.  Will go ahead and replace that.   PHYSICAL EXAMINATION:  VITAL SIGNS:  Temperature 98.5, pulse 114,  respirations 20, blood pressure 124/54.  GENERAL:  The patient is legally blind, however, he is alert and  oriented, no distress.  LUNGS:  Clear to auscultation bilaterally.  HEART:  Regular rate and rhythm.  ABDOMEN:  Soft, nontender and nondistended.  EXTREMITIES:  Decreased swelling of his upper left extremity with more  function.   LABORATORY DATA:  His labs for today show white count 9.3, hemoglobin  10.1, hematocrit 29.45, platelets of 397.  Sodium 148, potassium 3.2,  chloride 103, CO2 33, glucose 78, BUN 2 and creatinine 0.75.   ASSESSMENT/PLAN:  1. Upper extremity deep venous thrombosis.  2. Crohn's disease.  3. Anemia.   PLAN:  The patient seems to be doing well on anticoagulation therapy.  As noted before, plan would be discharge planning in about three days  when his INR is closer to 2 so that he can go home and finish his  therapy.  Due to his Crohn's disease and anemia, we are trying to keep  his Crohn's disease  under control, I feel that may be two more days of  monitoring to make sure that he remains stable as we increase his  Coumadin level would be appropriate for this patient.      Anselmo Pickler, DO  Electronically Signed     CB/MEDQ  D:  02/08/2008  T:  02/08/2008  Job:  [id number]

## 2011-02-16 NOTE — Discharge Summary (Signed)
NAMEWATSON, ROBARGE NO.:  [id number]   MEDICAL RECORD NO.:  [id number]          PATIENT TYPE:  INP   LOCATION:  IC09                          FACILITY:  [facility]   PHYSICIAN:  Audria Nine, M.D.DATE OF BIRTH:  [birth year]   DATE OF ADMISSION:  04/05/2008  DATE OF DISCHARGE:  07/05/2009LH                               DISCHARGE SUMMARY   REASON FOR DISCHARGE:  The patient is being transferred to [facility] in [place] for further care.   DISCHARGE DIAGNOSES:  1. Severe Crohn's disease likely requiring surgery.  2. Acute gastrointestinal bleed possibly from Crohn's disease.   OTHER DIAGNOSES:  1. Blood disorder.  2. Left internal jugular vein thrombus with pulmonary embolus while      patient was on Coumadin but was stopped 3 days ago due to      gastrointestinal bleeding.  3. Arthritis.  4. Skin rash.  5. Chronic nystagmus since birth.   DISCHARGE MEDICATIONS:  1. Omeprazole 20 mg p.o. b.i.d.  2. Zyrtec 10 mg p.o. once a day.  3. Calcium carbonate 200 mg in the morning and 600 mg at night.  4. Azathioprine 150 mg p.o. once a day.  5. Iron tablets 150 mg b.i.d.  6. Potassium chloride 20 mEq p.o. once daily.  7. Depakote ER 500 mg nightly.  8. Lipitor 10 mg p.o. once daily.  9. Risperdal 8 mg nightly.  10.Trazodone 1 mg nightly.  11.Coumadin 5 mg p.o. daily.  This has been put on hold since Friday      when the patient presented in acute gastrointestinal bleeding and      required FFP (fresh frozen plasma) to reverse anticoagulation.  His      INR was 2.5 at the time.  12.Multivitamin once daily.  13.Dicyclomine 10 mg at bedtime.  14.Hydrocodone 7.5 mg q.6 hours as needed.   DISCONTINUED MEDICATIONS:  1. Ibuprofen due to his recent history of GI bleed.  2. Coumadin, again due to recent history of GI bleed.   DIET:  Clear liquid diet at the time of discharge.   HISTORY OF PRESENT ILLNESS AND HOSPITAL COURSE:  Mr. Falck is a  45-year-  old male who is well known to our service for multiple admissions in the  past.  The patient has a history of Crohn's disease which was diagnosed  about 6 years ago.  During his last admission the patient was  transferred to [facility] where he underwent a colonoscopy  which showed involvement of the ascending colon.  However, he was  informed that he was going to have to undergo a colectomy and placement  of a colostomy.  The patient was recommended to have an open colectomy  because of the recurrence of his bleeding.  He was discharged from  [facility] about 3-1/2 weeks ago.  He presented to the hospital on July 3  with a history of bright red blood per rectum.  He has also been going  to the bathroom about 16-18 times a day.  The patient had some diffuse  abdominal pain.  The patient  was subsequently admitted to the intensive  care unit where he was monitored very closely.  The patient's hemoglobin  and hematocrit was stable after he received 1 unit of packed red blood  cells.  His admitting hemoglobin was 10.6.   The patient is also slightly hypotensive and received also fluid  therapy.  During the course of hospitalization, however, the patient  improved.  No significant bleeding.  His H and H was also stable.  He  did not require any additional blood transfusions.  The patient has  received fresh frozen plasma due to his INR being 2.5 at the time he was  admitted.   The patient remained mostly stable without requiring any pressor  therapy.  He was also started on a clear liquid diet which he tolerated  well without abdominal pain or distention.  The patient did not have any  nausea or vomiting.  The patient is now being transferred to [facility] for his planned surgery.   PERTINENT LABORATORY DATA:  On admission the patient's white blood cell  count was normal.  Hemoglobin was 10.6, MCV was 95, platelet count was  237, INR 2.4, sodium 133,  potassium 4.7, albumin 1.7, calcium 7.8.  Kindly see the rest of the labs attached in his discharge summary.   IMPORTANT CONSIDERATIONS:  It is sure we will have to debate about  continuing the patient's Coumadin; he had a history of DVT of the left  internal jugular vein and subsequent pulmonary embolus.  However, this  was associated with a central venous catheter that was placed about 2-  1/2 months ago.  I guess maybe after the patient's colectomy he will  need to be continued on the Coumadin for a total of 6-9 months'  duration.   We have also held off his Lasix at this time and some of his oral  psychotropic medications may need to be restarted.   ACTIVITY:  Bed rest.      Audria Nine, M.D.  Electronically Signed     AM/MEDQ  D:  04/07/2008  T:  04/07/2008  Job:  [id number]

## 2011-02-16 NOTE — Discharge Summary (Signed)
[name], [name]                ACCOUNT NO.:  [id number]   MEDICAL RECORD NO.:  [id number]          PATIENT TYPE:  INP   LOCATION:  A304                          FACILITY:  [facility]   PHYSICIAN:  [name], M.D.      DATE OF BIRTH:  1966-05-07   DATE OF ADMISSION:  01/22/2008  DATE OF DISCHARGE:  04/23/2009LH                               DISCHARGE SUMMARY   PRIMARY CARE PHYSICIAN:  Dr. Marval Regal.   ADMISSION DIAGNOSIS:  1. Vomiting.  2. Rectal bleeding.  3. Probable Crohn's flare.   SECONDARY DIAGNOSES:  1. Bipolar disorder.  2. Gastroesophageal reflux disease.  3. Asthma.  4. Hyperlipidemia.   DISCHARGE DIAGNOSES:  1. Active Crohn's colitis.  2. Urinary tract infection.  3. Sinus tachycardia likely related to prednisone.  4. Status post left chest Port-A-Cath placement.   PROCEDURES:  He had Port-A-Cath insertion by Dr. Aviva Signs on January 24, 2008.  On January 22, 2008 he had a CT of the abdomen and pelvis  without contrast which revealed mild wall thickening in the distal  transverse colon and descending colon similar but slightly less  pronounced in prior study in 2007/12/20.  He had a nodular density  peripherally in the left lung base which was new and was felt to be  likely inflammatory atelectasis or scarring.  The terminal ileum was  grossly unremarkable.  He had mild distal colonic wall thickening  similar to his prior study.  Chest x-ray done on January 22, 2008 revealed  bilateral shoulder replacements but no active disease.  Chest x-ray on  January 24, 2008 revealed left central line tip right atrium, no  pneumothorax.  EKG January 25, 2008 revealed sinus tachycardia with a rate  of 104; otherwise, normal.   HISTORY OF PRESENT ILLNESS:  The patient is a 45 year old gentleman with  a history of Crohn's colitis diagnosed back in 2001-12-19.  Previously  followed by Dr. Laural Golden until last year, and has been maintained on  Lialda with requirements of frequent  steroid tapers.  Currently he has  been on prednisone, Imuran, and Cimzia.  It has been very difficult to  obtain remission in this patient.  He had called complaining of  abdominal cramps for a week, and had developed recurrent rectal bleeding  which he described as large volume.  He also was having vomiting.  He  reported a temperature of 102 the day prior to admission.  He reported  10-11 large watery bowel movements a day.  He was complaining of pain  around the navel which was sharp.  Also complained of difficulty  urinating and burning with urination.  It was felt that he may have  Crohn's flare versus viral or infectious colitis/gastroenteritis, and he  was admitted for further management.   HOSPITAL COURSE:  The patient was admitted for further management and  serial H&H's and transfusing if needed.  He was also given antiemetics  and pain control.  Given that he is a difficult stick it was felt that  he would likely need a midline or Port-A-Cath placement.  Port-A-Cath  was placed as outlined above.  May 1 of this admission his home meds  were continued.  He was provided antiemetics and pain control.  By day  #2 of admission he was continuing to complain of loose bloody stools.  He quantified 18 total over a 24-hour period of time.  He was having  nausea but no further vomiting.  He was maintained on Flagyl 500 mg  t.i.d., prednisone 40 mg daily throughout his stay.  He was given  potassium to replete his hypokalemia.  His Imuran was resumed on January 24, 1988.  He was also started on Cipro for greater than 100,000 colony  forming units of enterococcus which was found in his urine.  It was felt  that he needed a 2-week course of therapy because of his  immunosuppression therapy.  By day #3 of his hospitalization he was  changed to a low residue, lactose-free diet.  He was feeling better.  There was less bleeding noted.  His hemoglobin actually remained fairly  stable throughout  his hospital stay.  By day #4 he was felt to be stable  for discharge.   LABORATORY ANALYSIS:  On admission his white count was 10,600,  hemoglobin was 12.2.  By the following day it was down to 10.5, but this  was felt to be mostly dilutional.  His hemoglobin remained stable  through the remaining hospital stay.  At time of discharge was 9.6.  His  MCV normal at 85 range.  Sodium on admission was 131.  It was 135 prior  to admission.  His potassium was 3.5; had been 3.3.  His creatinine was  normal at 1.17.  His urinalysis was negative.  However, his urine  culture did grow greater than 100,000 colonies of enterococcus species  which was sensitive to levofloxacin.  His stool culture was negative.  His fecal lactoferrin was positive.  C. difficile toxin titer was  negative.   PHYSICAL EXAMINATION:  VITAL SIGNS:  At time of discharge temperature  was 96.9, pulse 104, respirations 20, blood pressure 109/61.  GENERAL:  Pleasant, well-nourished, well-developed Caucasian gentleman in no acute  distress.  SKIN:  Warm, dry, no jaundice.  CHEST:  Port-A-Cath site in the left upper chest with no erythema or  drainage.  LUNGS:  Clear to auscultations.  HEART:  Heart rate was regular, but in the low 100's.  No murmurs.  ABDOMEN:  Positive bowel sounds, soft, nontender.  EXTREMITIES:  Lower extremities:  No edema.   DISCHARGE MEDICATIONS:  1. Wellbutrin 150 mg daily.  2. Depakote 1500 mg q.h.s.  3. Iron 150 mg b.i.d.  4. Lasix 40 mg daily.  5. Lipitor 10 mg q.h.s.  6. Omeprazole 20 mg b.i.d.  7. Potassium chloride 20 mEq daily.  8. Risperdal 8 mg q.h.s.  9. Trazodone 200 mg q.h.s.  10.Zyrtec 10 mg daily.  11.Calcium with vitamin D 600 mg two in the morning, one in the      evenings.  12.Azathioprine 50 mg two daily.  13.Cogentin 1 mg at 8 p.m. every night.  14.Levaquin 250 mg daily for 14 days.  15.Flagyl 500 mg b.i.d., two week supply given.  16.Prednisone 40 mg daily for two weeks,  30 mg daily for two weeks, 20      mg daily for two weeks, 10 mg daily, #100 given, 0 refills.  17.Phenergan 12.5 mg p.o. q.6 h. p.r.n. #20, 0 refills.  18.Vicodin 5/500 mg p.o. t.i.d. p.r.n. #20, 0 refills  given.  19.He was advised to continue his Combivent/albuterol as before.   DISCHARGE INSTRUCTIONS:  He is to follow up with Dr. Stann Mainland on the 8th at  11:30 a.m.  He is to call with increased bleeding, increased pain or  fever.  He is continue a low residue, lactose free diet.  He can  increase activity slowly.   DISCHARGE DISPOSITION:  Stable.      [name], P.A.      [name], M.D.  Electronically Signed    LL/MEDQ  D:  01/29/2008  T:  01/29/2008  Job:  [id number]   cc:   Marval Regal, M.D.

## 2011-02-16 NOTE — Group Therapy Note (Signed)
[name], [name]                ACCOUNT NO.:  [id number]   MEDICAL RECORD NO.:  [id number]          PATIENT TYPE:  INP   LOCATION:  A201                          FACILITY:  [facility]   PHYSICIAN:  Edward L. Luan Pulling, M.D.DATE OF BIRTH:  12/07/1965   DATE OF PROCEDURE:  02/02/2008  DATE OF DISCHARGE:                                 PROGRESS NOTE   A patient of the [facility] Hospitalist Team.   SUBJECTIVE:  [name] seems to be doing a better.  His arm is not  quite as swollen.   His exam shows temperature is 98.2, pulse 90, respirations 20, blood  pressure 120/79, and O2 sat is 93% on room air.  His chest is clear.  As  mentioned, his arm does not seem quite as swollen.  This morning, he  tells me that his mother had some sort of clotting abnormality.  He said  that no one in the family had a clotting abnormality as far as he knew  when I talked to him yesterday, so this is a difference in his history  and we ought to go ahead and get at least a Leiden factor, that we can  get while he is on anticoagulation.   Plan is for him to have his Port-A-Cath removed today and continue with  all of his other treatments and follow.      Edward L. Luan Pulling, M.D.  Electronically Signed     ELH/MEDQ  D:  02/02/2008  T:  02/02/2008  Job:  [id number]

## 2011-05-17 ENCOUNTER — Ambulatory Visit (INDEPENDENT_AMBULATORY_CARE_PROVIDER_SITE_OTHER): Payer: Medicaid Other | Admitting: Gastroenterology

## 2011-05-17 ENCOUNTER — Encounter: Payer: Self-pay | Admitting: Gastroenterology

## 2011-05-17 VITALS — BP 118/79 | HR 84 | Temp 97.7°F | Ht 66.0 in | Wt 234.0 lb

## 2011-05-17 DIAGNOSIS — K219 Gastro-esophageal reflux disease without esophagitis: Secondary | ICD-10-CM

## 2011-05-17 DIAGNOSIS — K5010 Crohn's disease of large intestine without complications: Secondary | ICD-10-CM

## 2011-05-17 DIAGNOSIS — K921 Melena: Secondary | ICD-10-CM

## 2011-05-17 LAB — CBC WITH DIFFERENTIAL/PLATELET
Basophils Absolute: 0 10*3/uL (ref 0.0–0.1)
Basophils Relative: 0 % (ref 0–1)
Lymphocytes Relative: 34 % (ref 12–46)
Neutro Abs: 3.5 10*3/uL (ref 1.7–7.7)
Neutrophils Relative %: 55 % (ref 43–77)
Platelets: 218 10*3/uL (ref 150–400)
RDW: 14.8 % (ref 11.5–15.5)
WBC: 6.3 10*3/uL (ref 4.0–10.5)

## 2011-05-17 LAB — COMPREHENSIVE METABOLIC PANEL
ALT: 59 U/L — ABNORMAL HIGH (ref 0–53)
Albumin: 4.6 g/dL (ref 3.5–5.2)
CO2: 22 mEq/L (ref 19–32)
Calcium: 9.6 mg/dL (ref 8.4–10.5)
Chloride: 103 mEq/L (ref 96–112)
Creat: 0.98 mg/dL (ref 0.50–1.35)
Potassium: 4.6 mEq/L (ref 3.5–5.3)
Sodium: 141 mEq/L (ref 135–145)
Total Protein: 6.9 g/dL (ref 6.0–8.3)

## 2011-05-17 MED ORDER — DEXLANSOPRAZOLE 60 MG PO CPDR: 60.0000 mg | DELAYED_RELEASE_CAPSULE | Freq: Every day | ORAL | Status: DC

## 2011-05-17 NOTE — Patient Instructions (Signed)
Please have your labwork done today. Stop omeprazole. Begin Dexilant. Take once daily 30 minutes before breakfast. You will not need to take it twice daily. RX given along with samples.  Will discuss adding back medication for Crohn's with Dr. Oneida Alar.

## 2011-05-17 NOTE — Assessment & Plan Note (Signed)
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 H/O chronic NSAID use, d/c Relafen last year after melena and small bowel erosions/ulcers seen on capsule study. Now on Celebrex. ?NSAID vs SB Crohns? Patient with diarrhea and abdominal cramping as well.   Check labs, CBC/CMP.

## 2011-05-17 NOTE — Progress Notes (Signed)
Primary Care Physician: Marval Regal, MD  Primary Gastroenterologist:  Barney Drain, MD   Chief Complaint  Patient presents with  . Follow-up    has been having cramps in stomach    HPI: [name] is a 45 y.o. male here for f/u Crohn's disease. He has h/o Crohn's colitis s/p total proctocolectomy with end ileotomy in 2009 at [facility] for refractory disease. He has done well since that time. Last year he had melena (normal CBC) and underwent EGD and SB capsule endoscopy. He had chronic gastritis. Few nonbleeding small bowel erosions/tiny ulcers. At that time he was advised to stop Relafen which he had been on chronically.   Today he presents for f/u but has been having melena for two weeks and increased watery stools and abdominal cramps. Since he was last seen, he was put on Celebrex. He complains of nocturnal heartburn which wakes him up and keeps him up at night. On Prilosec BID chronically. Never on other PPI before. No spicey food. Girlfriend on Hospice. Diagnosed one year ago with rectal cancer. She refused treatment even though she was told she was curable (per patient report). He says they are coping well. C/O having to empty ileostomy bag every 5 minutes for about 12 times first thing in the morning. Then slows down but still every hour. Bag gets 2/3 full. No brbpr. Black stool every day for past two weeks. No iron or PeptoBismol. No energy. Dr. Bridget Hartshorn started methotrexate, one year ago. Patient not sure why. Celebrex since last year. Getting toenails removed soon. Abdominal cramps. No n/v. Stoma swelling. Red. No bleeding.    Current Outpatient Prescriptions  Medication Sig Dispense Refill  . benzonatate (TESSALON) 100 MG capsule Take 100 mg by mouth 3 (three) times daily as needed.        . benztropine (COGENTIN) 1 MG tablet Take 1 mg by mouth 2 (two) times daily.        . calcium-vitamin D (OSCAL WITH D) 500-200 MG-UNIT per tablet Take 1 tablet by mouth daily.        .  celecoxib (CELEBREX) 200 MG capsule Take 200 mg by mouth 2 (two) times daily.        . cetirizine (ZYRTEC) 10 MG tablet Take 10 mg by mouth daily.        Marland Kitchen darifenacin (ENABLEX) 7.5 MG 24 hr tablet Take 7.5 mg by mouth daily.        . divalproex (DEPAKOTE) 500 MG 24 hr tablet Take 500 mg by mouth daily.        . folic acid (FOLVITE) 1 MG tablet Take 1 mg by mouth daily.        . furosemide (LASIX) 40 MG tablet Take 40 mg by mouth daily.        Marland Kitchen gabapentin (NEURONTIN) 300 MG capsule Take 300 mg by mouth 3 (three) times daily.        . methotrexate (RHEUMATREX) 2.5 MG tablet Take 2.5 mg by mouth once a week. Caution:Chemotherapy. Protect from light.       . metoprolol succinate (TOPROL-XL) 25 MG 24 hr tablet Take 25 mg by mouth daily at 2 PM daily at 2 PM. Taking 1/2       . pravastatin (PRAVACHOL) 20 MG tablet Take 20 mg by mouth daily.        . risperiDONE (RISPERDAL) 2 MG tablet Take 2 mg by mouth daily.        Marland Kitchen rOPINIRole (REQUIP) 2 MG tablet Take  2 mg by mouth at bedtime.        . Tamsulosin HCl (FLOMAX) 0.4 MG CAPS Take 0.4 mg by mouth daily.        Marland Kitchen dexlansoprazole (DEXILANT) 60 MG capsule Take 1 capsule (60 mg total) by mouth daily.  30 capsule  5    Allergies as of 05/17/2011 - Review Complete 05/17/2011  Allergen Reaction Noted  . Aspirin    . Codeine    . Ibuprofen    . Penicillins    . Tramadol hcl    . Vicodin (hydrocodone-acetaminophen) Nausea Only 05/17/2011    ROS:  General: Negative for anorexia, weight loss, fever, chills, weakness. ENT: Negative for hoarseness, difficulty swallowing , nasal congestion. CV: Negative for chest pain, angina, palpitations, dyspnea on exertion, peripheral edema.  Respiratory: Negative for dyspnea at rest, dyspnea on exertion, cough, sputum, wheezing.  GI: See history of present illness. GU:  Negative for dysuria, hematuria, urinary incontinence, urinary frequency, nocturnal urination.  Endo: Negative for unusual weight change.      Physical Examination:   BP 118/79  Pulse 84  Temp(Src) 97.7 F (36.5 C) (Temporal)  Ht 5' 6"  (1.676 m)  Wt 234 lb (106.142 kg)  BMI 37.77 kg/m2  General: Well-nourished, well-developed in no acute distress.  Eyes: No icterus. Mouth: Oropharyngeal mucosa moist and pink , no lesions erythema or exudate. Lungs: Clear to auscultation bilaterally.  Heart: Regular rate and rhythm, no murmurs rubs or gallops.  Abdomen: Bowel sounds are normal. Ileostomy bag in right mid abdomen. Stoma red. Several small erosions noted. Watery yellow stool present. Tender throughout abd but no rebound or guarding. No hepatosplenomegaly or masses, no abdominal bruits or hernia.   Extremities: No lower extremity edema. No clubbing or deformities. Neuro: Alert and oriented x 4   Skin: Warm and dry, no jaundice.   Psych: Alert and cooperative, normal mood and affect.

## 2011-05-17 NOTE — Progress Notes (Signed)
Cc to PCP 

## 2011-05-17 NOTE — Assessment & Plan Note (Signed)
Refractory nocturnal heartburn. Stop omeprazole. Begin Dexilant. Samples and RX provided.

## 2011-05-17 NOTE — Progress Notes (Signed)
Pt needs sb imaging.

## 2011-05-17 NOTE — Assessment & Plan Note (Signed)
Check CBC today. Patient is on chronic NSAIDS. Would recommend avoiding for now, but patient reluctant. May ultimately need to resume medication for small bowel Crohn's. Will discuss with Dr. Oneida Alar.

## 2011-05-19 ENCOUNTER — Telehealth: Payer: Self-pay

## 2011-05-19 NOTE — Telephone Encounter (Signed)
Discussed with Dr. Oneida Alar. See OV note from 05/17/2011.

## 2011-05-19 NOTE — Telephone Encounter (Signed)
See note dated 05/19/2011. Pt informed of all of the info.  He will come by tomorrow to get bottle and order for stool test and CBC in Mon.

## 2011-05-19 NOTE — Progress Notes (Signed)
Discussed with Dr. Oneida Alar. Pt called in with c/o melena today. Please schedule CBC for Monday. Schedule for ileoscopy. No bowel prep needed except begin clear liquids at 6pm the night before. If H/H drop we will also add on EGD. Check stool for Giardia EIA. Stop Celebrex.

## 2011-05-19 NOTE — Telephone Encounter (Signed)
Pt called and said that he has black diarrhea today. It is running out of his bag and messed his clothes up. Please advise!

## 2011-05-20 ENCOUNTER — Other Ambulatory Visit: Payer: Self-pay

## 2011-05-20 DIAGNOSIS — K921 Melena: Secondary | ICD-10-CM

## 2011-05-20 NOTE — Telephone Encounter (Signed)
Pt called back. Could not get a ride today. Order for giardia faxed to [facility] also.

## 2011-05-20 NOTE — Telephone Encounter (Signed)
Pt called and wanted the CBC order faxed to [facility] for Mon. Faxed it over. Michela Pitcher he will try to get by today and get the container for the stool giardia test. That is up front. He can be reached today on his cell number for scheduling the ileoscopy and possibly EGD.

## 2011-05-21 ENCOUNTER — Telehealth: Payer: Self-pay

## 2011-05-21 NOTE — Telephone Encounter (Signed)
Informed pt I was working on a PA for his medication.

## 2011-05-24 ENCOUNTER — Other Ambulatory Visit: Payer: Self-pay

## 2011-05-24 DIAGNOSIS — K5090 Crohn's disease, unspecified, without complications: Secondary | ICD-10-CM

## 2011-05-24 LAB — CBC WITH DIFFERENTIAL/PLATELET
HCT: 42 %
Hemoglobin: 13.9 g/dL (ref 13.5–17.5)
WBC: 5.3

## 2011-05-24 LAB — GIARDIA ANTIGEN: Giardia Ag, Stl: NOT DETECTED

## 2011-05-24 NOTE — Progress Notes (Signed)
Quick Note:  Please let patient know his AST/ALT are up some. They were up some 9 months ago as well. Please arrange for the following:  1. Hep B surface Antigen. 2. Hep C antibody. 3. Iron and tibc 4. Ferritin 5. Quantitative immunoglobulins. 6. ANA 7. AMA 8. Smooth muscle antibody. 9. Abd u/s ______

## 2011-05-26 ENCOUNTER — Other Ambulatory Visit: Payer: Self-pay | Admitting: Gastroenterology

## 2011-05-26 DIAGNOSIS — K921 Melena: Secondary | ICD-10-CM

## 2011-05-26 NOTE — Progress Notes (Signed)
Quick Note:  Pt is scheduled for u/s on 08/27/ @ 9:00- pt is aware ______

## 2011-05-26 NOTE — Progress Notes (Signed)
Did pt get schedule for ileoscopy??

## 2011-05-26 NOTE — Progress Notes (Signed)
Quick Note:  CBC normal. Giardia negative. Await ileoscopy.  F/U pending liver labs. ______

## 2011-05-28 NOTE — Progress Notes (Signed)
Quick Note:  Pt [place] rescheduled to 08/30 @ 8 due to insurance pre cert issues, pt aware, I tried to schedule ileoscopy but pt did not want to do this at this time. He stated that he would call me back to do this. ______

## 2011-05-28 NOTE — Progress Notes (Signed)
Quick Note:  Pt informed. Michela Pitcher he has not been called about the ileoscopy yet. ______

## 2011-05-31 ENCOUNTER — Ambulatory Visit (HOSPITAL_COMMUNITY): Payer: Medicaid Other

## 2011-06-03 ENCOUNTER — Ambulatory Visit (HOSPITAL_COMMUNITY): Payer: Medicaid Other

## 2011-06-05 DIAGNOSIS — K5000 Crohn's disease of small intestine without complications: Secondary | ICD-10-CM

## 2011-06-05 DIAGNOSIS — Z9889 Other specified postprocedural states: Secondary | ICD-10-CM

## 2011-06-05 HISTORY — DX: Crohn's disease of small intestine without complications: K50.00

## 2011-06-05 HISTORY — DX: Other specified postprocedural states: Z98.890

## 2011-06-08 ENCOUNTER — Other Ambulatory Visit: Payer: Self-pay

## 2011-06-08 ENCOUNTER — Other Ambulatory Visit: Payer: Self-pay | Admitting: Gastroenterology

## 2011-06-08 ENCOUNTER — Ambulatory Visit (HOSPITAL_COMMUNITY)
Admission: RE | Admit: 2011-06-08 | Discharge: 2011-06-08 | Disposition: A | Discharge status: Home / Self Care | Payer: Medicaid Other | Source: Ambulatory Visit | Attending: Gastroenterology | Admitting: Gastroenterology

## 2011-06-08 ENCOUNTER — Telehealth: Payer: Self-pay

## 2011-06-08 DIAGNOSIS — K921 Melena: Secondary | ICD-10-CM

## 2011-06-08 DIAGNOSIS — R109 Unspecified abdominal pain: Secondary | ICD-10-CM | POA: Insufficient documentation

## 2011-06-08 DIAGNOSIS — K7689 Other specified diseases of liver: Secondary | ICD-10-CM | POA: Insufficient documentation

## 2011-06-08 LAB — IRON
Ferritin: 64 ng/mL (ref 18.0–300.0)
Immunoglobulin A, Quant, CSF: 211
Immunoglobulin G, Serum: 840
Immunoglobulin M, Quant, CSF: 90

## 2011-06-08 NOTE — Telephone Encounter (Signed)
Pt had questions about his prep for his ileoscopy. Per Progress note of 05/19/2011 I informed pt he does not need prep other than to drink clear liquids from 6-12 pm evening prior to procedure, and nothing to eat or drink after midnight.

## 2011-06-08 NOTE — Telephone Encounter (Signed)
Please check on this. Patient should be on Dexilant. Switched at last OV.

## 2011-06-08 NOTE — Telephone Encounter (Signed)
Patient is on nexium because insurance would not pay for the dexilant. She does not need the omeprazole refilled this was an error.

## 2011-06-08 NOTE — Telephone Encounter (Signed)
Discussed with Doris.

## 2011-06-14 NOTE — Progress Notes (Signed)
Quick Note:  Pt informed ______ 

## 2011-06-16 NOTE — Progress Notes (Signed)
Quick Note:  U/s showed fatty liver. AST/ALT up but other add-on labs good. LFTs in six months. Instructions for fatty liver: Recommend 1-2# weight loss per week until ideal body weight through exercise & diet. Low fat/cholesterol diet. Gradually increase exercise from 15 min daily up to 1 hr per day 5 days/week. Limit alcohol use.   ______

## 2011-06-17 ENCOUNTER — Other Ambulatory Visit: Payer: Self-pay

## 2011-06-17 DIAGNOSIS — K5090 Crohn's disease, unspecified, without complications: Secondary | ICD-10-CM

## 2011-06-17 NOTE — Progress Notes (Signed)
Quick Note:  Pt informed. Diets and fatty liver material mailed to pt. Lab order on file for 12/14/2011. ______

## 2011-06-25 LAB — CBC
HCT: 27.2 — ABNORMAL LOW
HCT: 27.6 — ABNORMAL LOW
HCT: 28.2 — ABNORMAL LOW
HCT: 32.7 — ABNORMAL LOW
Hemoglobin: 8.8 — ABNORMAL LOW
Hemoglobin: 8.9 — ABNORMAL LOW
Hemoglobin: 9 — ABNORMAL LOW
MCHC: 32.4
MCHC: 31.6
MCHC: 32.7
MCV: 80.4
MCV: 84.8
MCV: 85.1
MCV: 85.2
Platelets: 389
Platelets: 327
Platelets: 333
Platelets: 366
Platelets: 415 — ABNORMAL HIGH
RBC: 3.32 — ABNORMAL LOW
RBC: 3.33 — ABNORMAL LOW
RBC: 3.86 — ABNORMAL LOW
RDW: 16.4 — ABNORMAL HIGH
RDW: 17.6 — ABNORMAL HIGH
RDW: 18 — ABNORMAL HIGH
RDW: 19.2 — ABNORMAL HIGH
WBC: 14 — ABNORMAL HIGH
WBC: 14.7 — ABNORMAL HIGH
WBC: 18.5 — ABNORMAL HIGH
WBC: 9.2

## 2011-06-25 LAB — URINALYSIS, ROUTINE W REFLEX MICROSCOPIC
Bilirubin Urine: NEGATIVE
Glucose, UA: NEGATIVE
Hgb urine dipstick: NEGATIVE
Ketones, ur: NEGATIVE
Protein, ur: NEGATIVE
Urobilinogen, UA: 0.2

## 2011-06-25 LAB — COMPREHENSIVE METABOLIC PANEL
ALT: 14
Albumin: 2.2 — ABNORMAL LOW
Alkaline Phosphatase: 68
BUN: 5 — ABNORMAL LOW
Chloride: 102
Potassium: 3.2 — ABNORMAL LOW
Sodium: 137
Total Bilirubin: 0.5
Total Protein: 5.4 — ABNORMAL LOW

## 2011-06-25 LAB — DIFFERENTIAL
Basophils Absolute: 0.1
Basophils Absolute: 0
Basophils Absolute: 0
Basophils Absolute: 0.1
Basophils Relative: 2 — ABNORMAL HIGH
Basophils Relative: 1
Basophils Relative: 1
Basophils Relative: 1
Eosinophils Absolute: 0.1
Eosinophils Absolute: 0
Eosinophils Absolute: 0.2
Eosinophils Absolute: 0.2
Eosinophils Relative: 2
Eosinophils Relative: 0
Eosinophils Relative: 0
Eosinophils Relative: 2
Eosinophils Relative: 2
Lymphocytes Relative: 33
Lymphocytes Relative: 14
Lymphocytes Relative: 29
Lymphocytes Relative: 6 — ABNORMAL LOW
Lymphs Abs: 1.4
Lymphs Abs: 2
Monocytes Absolute: 0.9
Monocytes Absolute: 0.5
Monocytes Absolute: 1.3 — ABNORMAL HIGH
Monocytes Relative: 14 — ABNORMAL HIGH
Monocytes Relative: 4
Monocytes Relative: 4
Monocytes Relative: 5
Neutro Abs: 12.6 — ABNORMAL HIGH
Neutro Abs: 5.1
Neutro Abs: 6
Neutrophils Relative %: 86 — ABNORMAL HIGH
Neutrophils Relative %: 89 — ABNORMAL HIGH

## 2011-06-25 LAB — STOOL CULTURE

## 2011-06-25 LAB — CLOSTRIDIUM DIFFICILE EIA

## 2011-06-25 LAB — BASIC METABOLIC PANEL
Calcium: 8.5
Chloride: 108
Creatinine, Ser: 0.9
GFR calc Af Amer: >60
Sodium: 142

## 2011-06-25 LAB — HEPATIC FUNCTION PANEL
Bilirubin, Direct: <0.1
Total Bilirubin: 0.2 — ABNORMAL LOW

## 2011-06-25 LAB — FERRITIN: Ferritin: 7 — ABNORMAL LOW (ref 22–322)

## 2011-06-25 LAB — RETICULOCYTES
RBC.: 3.18 — ABNORMAL LOW
Retic Count, Absolute: 95.4
Retic Ct Pct: 3

## 2011-06-28 MED ORDER — SODIUM CHLORIDE 0.45 % IV SOLN
Freq: Once | INTRAVENOUS | Status: AC
  Administered 2011-06-29: 09:00:00 via INTRAVENOUS

## 2011-06-29 ENCOUNTER — Other Ambulatory Visit: Payer: Self-pay | Admitting: Gastroenterology

## 2011-06-29 ENCOUNTER — Ambulatory Visit (HOSPITAL_COMMUNITY)
Admission: RE | Admit: 2011-06-29 | Discharge: 2011-06-29 | Disposition: A | Discharge status: Home / Self Care | Payer: Medicaid Other | Source: Ambulatory Visit | Attending: Gastroenterology | Admitting: Gastroenterology

## 2011-06-29 ENCOUNTER — Encounter (HOSPITAL_COMMUNITY): Payer: Self-pay

## 2011-06-29 ENCOUNTER — Encounter (HOSPITAL_COMMUNITY)
Admission: RE | Disposition: A | Discharge status: Home / Self Care | Payer: Self-pay | Source: Ambulatory Visit | Attending: Gastroenterology

## 2011-06-29 DIAGNOSIS — Z9049 Acquired absence of other specified parts of digestive tract: Secondary | ICD-10-CM | POA: Insufficient documentation

## 2011-06-29 DIAGNOSIS — K5090 Crohn's disease, unspecified, without complications: Secondary | ICD-10-CM

## 2011-06-29 DIAGNOSIS — K449 Diaphragmatic hernia without obstruction or gangrene: Secondary | ICD-10-CM

## 2011-06-29 DIAGNOSIS — K2990 Gastroduodenitis, unspecified, without bleeding: Secondary | ICD-10-CM

## 2011-06-29 DIAGNOSIS — K269 Duodenal ulcer, unspecified as acute or chronic, without hemorrhage or perforation: Secondary | ICD-10-CM | POA: Insufficient documentation

## 2011-06-29 DIAGNOSIS — K921 Melena: Secondary | ICD-10-CM | POA: Insufficient documentation

## 2011-06-29 DIAGNOSIS — K2970 Gastritis, unspecified, without bleeding: Secondary | ICD-10-CM

## 2011-06-29 DIAGNOSIS — R109 Unspecified abdominal pain: Secondary | ICD-10-CM | POA: Insufficient documentation

## 2011-06-29 DIAGNOSIS — K633 Ulcer of intestine: Secondary | ICD-10-CM

## 2011-06-29 DIAGNOSIS — Z932 Ileostomy status: Secondary | ICD-10-CM | POA: Insufficient documentation

## 2011-06-29 HISTORY — PX: ILEOSCOPY: SHX5434

## 2011-06-29 LAB — BASIC METABOLIC PANEL
BUN: 3 — ABNORMAL LOW
BUN: 5 — ABNORMAL LOW
CO2: 26
CO2: 27
CO2: 28
CO2: 31
CO2: 31
Calcium: 7.6 — ABNORMAL LOW
Chloride: 89 — ABNORMAL LOW
Chloride: 93 — ABNORMAL LOW
Chloride: 97
Chloride: 99
Creatinine, Ser: 0.78
GFR calc Af Amer: >60
GFR calc Af Amer: >60
GFR calc Af Amer: >60
GFR calc non Af Amer: >60
GFR calc non Af Amer: >60
GFR calc non Af Amer: >60
Glucose, Bld: 73
Glucose, Bld: 79
Glucose, Bld: 89
Glucose, Bld: 92
Potassium: 3.2 — ABNORMAL LOW
Potassium: 3.3 — ABNORMAL LOW
Potassium: 3.5
Potassium: 3.9
Potassium: 4.2
Sodium: 130 — ABNORMAL LOW
Sodium: 131 — ABNORMAL LOW
Sodium: 132 — ABNORMAL LOW
Sodium: 135

## 2011-06-29 LAB — CBC
HCT: 22.9 — ABNORMAL LOW
HCT: 25.8 — ABNORMAL LOW
HCT: 35.7 — ABNORMAL LOW
Hemoglobin: 10.2 — ABNORMAL LOW
Hemoglobin: 12.2 — ABNORMAL LOW
Hemoglobin: 7.8 — CL
Hemoglobin: 8.8 — ABNORMAL LOW
MCHC: 33.8
MCHC: 34.1
MCV: 85.1
MCV: 86
RBC: 2.66 — ABNORMAL LOW
RBC: 3 — ABNORMAL LOW
RBC: 3.59 — ABNORMAL LOW
RDW: 18.3 — ABNORMAL HIGH
RDW: 18.4 — ABNORMAL HIGH
WBC: 10.6 — ABNORMAL HIGH
WBC: 8.6

## 2011-06-29 LAB — DIFFERENTIAL
Basophils Absolute: 0
Basophils Relative: 0
Eosinophils Absolute: 0.1
Eosinophils Absolute: 0.3
Eosinophils Relative: 0
Eosinophils Relative: 1
Eosinophils Relative: 3
Lymphocytes Relative: 11 — ABNORMAL LOW
Lymphocytes Relative: 16
Lymphocytes Relative: 22
Lymphs Abs: 1.6
Lymphs Abs: 1.7
Lymphs Abs: 2.2
Monocytes Absolute: 0.9
Monocytes Absolute: 1.2 — ABNORMAL HIGH
Monocytes Absolute: 1.2 — ABNORMAL HIGH
Monocytes Absolute: 1.6 — ABNORMAL HIGH
Monocytes Relative: 10
Monocytes Relative: 12
Monocytes Relative: 9
Neutro Abs: 10.5 — ABNORMAL HIGH
Neutro Abs: 6.7
Neutrophils Relative %: 71

## 2011-06-29 LAB — CROSSMATCH
ABO/RH(D): A POS
Antibody Screen: NEGATIVE

## 2011-06-29 LAB — URINALYSIS, ROUTINE W REFLEX MICROSCOPIC
Glucose, UA: NEGATIVE
pH: 5.5

## 2011-06-29 LAB — CLOSTRIDIUM DIFFICILE EIA

## 2011-06-29 LAB — B-NATRIURETIC PEPTIDE (CONVERTED LAB): Pro B Natriuretic peptide (BNP): 32.1

## 2011-06-29 LAB — URINE CULTURE
Colony Count: >=100000
Special Requests: NEGATIVE

## 2011-06-29 LAB — APTT: aPTT: 53 — ABNORMAL HIGH

## 2011-06-29 LAB — HEMOGLOBIN AND HEMATOCRIT, BLOOD
HCT: 28.7 — ABNORMAL LOW
HCT: 30.9 — ABNORMAL LOW
HCT: 33.7 — ABNORMAL LOW

## 2011-06-29 LAB — PROTIME-INR: Prothrombin Time: 14.5

## 2011-06-29 LAB — FECAL LACTOFERRIN, QUANT

## 2011-06-29 LAB — STOOL CULTURE

## 2011-06-29 SURGERY — ILEOSCOPY, THROUGH STOMA
Anesthesia: Moderate Sedation

## 2011-06-29 MED ORDER — MEPERIDINE HCL 100 MG/ML IJ SOLN
INTRAMUSCULAR | Status: AC
  Filled 2011-06-29: qty 2

## 2011-06-29 MED ORDER — MIDAZOLAM HCL 5 MG/5ML IJ SOLN
INTRAMUSCULAR | Status: AC
  Filled 2011-06-29: qty 10

## 2011-06-29 MED ORDER — MEPERIDINE HCL 25 MG/ML IJ SOLN
INTRAMUSCULAR | Status: DC | PRN
  Administered 2011-06-29 (×2): 50 mg via INTRAVENOUS

## 2011-06-29 MED ORDER — PROMETHAZINE HCL 25 MG/ML IJ SOLN
INTRAMUSCULAR | Status: AC
  Filled 2011-06-29: qty 1

## 2011-06-29 MED ORDER — MIDAZOLAM HCL 5 MG/5ML IJ SOLN
INTRAMUSCULAR | Status: DC | PRN
  Administered 2011-06-29 (×2): 2 mg via INTRAVENOUS

## 2011-06-29 MED ORDER — PROMETHAZINE HCL 25 MG/ML IJ SOLN
INTRAMUSCULAR | Status: DC | PRN
  Administered 2011-06-29: 12.5 mg via INTRAVENOUS

## 2011-06-29 NOTE — H&P (Signed)
Reason for Visit     Follow-up    has been having cramps in stomach        Vitals - Last Recorded       BP Pulse Temp(Src) Ht Wt BMI    118/79  84  97.7 F (36.5 C) (Temporal)  5' 6"  (1.676 m)  234 lb (106.142 kg)  37.77 kg/m2          Progress Notes     Neil Crouch, PA  05/17/2011 12:31 PM  Signed Primary Care Physician: Marval Regal, MD   Primary Gastroenterologist:  Barney Drain, MD      Chief Complaint   Patient presents with   .  Follow-up       has been having cramps in stomach      HPI: [name] is a 45 y.o. male here for f/u Crohn's disease. He has h/o Crohn's colitis s/p total proctocolectomy with end ileotomy in 2009 at [facility] for refractory disease. He has done well since that time. Last year he had melena (normal CBC) and underwent EGD and SB capsule endoscopy. He had chronic gastritis. Few nonbleeding small bowel erosions/tiny ulcers. At that time he was advised to stop Relafen which he had been on chronically.    Today he presents for f/u but has been having melena for two weeks and increased watery stools and abdominal cramps. Since he was last seen, he was put on Celebrex. He complains of nocturnal heartburn which wakes him up and keeps him up at night. On Prilosec BID chronically. Never on other PPI before. No spicey food. Girlfriend on Hospice. Diagnosed one year ago with rectal cancer. She refused treatment even though she was told she was curable (per patient report). He says they are coping well. C/O having to empty ileostomy bag every 5 minutes for about 12 times first thing in the morning. Then slows down but still every hour. Bag gets 2/3 full. No brbpr. Black stool every day for past two weeks. No iron or PeptoBismol. No energy. Dr. Bridget Hartshorn started methotrexate, one year ago. Patient not sure why. Celebrex since last year. Getting toenails removed soon. Abdominal cramps. No n/v. Stoma swelling. Red. No bleeding.       Current Outpatient  Prescriptions   Medication  Sig  Dispense  Refill   .  benzonatate (TESSALON) 100 MG capsule  Take 100 mg by mouth 3 (three) times daily as needed.           .  benztropine (COGENTIN) 1 MG tablet  Take 1 mg by mouth 2 (two) times daily.           .  calcium-vitamin D (OSCAL WITH D) 500-200 MG-UNIT per tablet  Take 1 tablet by mouth daily.           .  celecoxib (CELEBREX) 200 MG capsule  Take 200 mg by mouth 2 (two) times daily.           .  cetirizine (ZYRTEC) 10 MG tablet  Take 10 mg by mouth daily.           Marland Kitchen  darifenacin (ENABLEX) 7.5 MG 24 hr tablet  Take 7.5 mg by mouth daily.           .  divalproex (DEPAKOTE) 500 MG 24 hr tablet  Take 500 mg by mouth daily.           .  folic acid (FOLVITE) 1 MG tablet  Take 1 mg  by mouth daily.           .  furosemide (LASIX) 40 MG tablet  Take 40 mg by mouth daily.           Marland Kitchen  gabapentin (NEURONTIN) 300 MG capsule  Take 300 mg by mouth 3 (three) times daily.           .  methotrexate (RHEUMATREX) 2.5 MG tablet  Take 2.5 mg by mouth once a week. Caution:Chemotherapy. Protect from light.          .  metoprolol succinate (TOPROL-XL) 25 MG 24 hr tablet  Take 25 mg by mouth daily at 2 PM daily at 2 PM. Taking 1/2          .  pravastatin (PRAVACHOL) 20 MG tablet  Take 20 mg by mouth daily.           .  risperiDONE (RISPERDAL) 2 MG tablet  Take 2 mg by mouth daily.           Marland Kitchen  rOPINIRole (REQUIP) 2 MG tablet  Take 2 mg by mouth at bedtime.           .  Tamsulosin HCl (FLOMAX) 0.4 MG CAPS  Take 0.4 mg by mouth daily.           Marland Kitchen  dexlansoprazole (DEXILANT) 60 MG capsule  Take 1 capsule (60 mg total) by mouth daily.   30 capsule   5       Allergies as of 05/17/2011 - Review Complete 05/17/2011   Allergen  Reaction  Noted   .  Aspirin       .  Codeine       .  Ibuprofen       .  Penicillins       .  Tramadol hcl       .  Vicodin (hydrocodone-acetaminophen)  Nausea Only  05/17/2011      ROS:   General: Negative for anorexia, weight loss, fever,  chills, weakness. ENT: Negative for hoarseness, difficulty swallowing , nasal congestion. CV: Negative for chest pain, angina, palpitations, dyspnea on exertion, peripheral edema.   Respiratory: Negative for dyspnea at rest, dyspnea on exertion, cough, sputum, wheezing.   GI: See history of present illness. GU:  Negative for dysuria, hematuria, urinary incontinence, urinary frequency, nocturnal urination.   Endo: Negative for unusual weight change.     Physical Examination:    BP 118/79  Pulse 84  Temp(Src) 97.7 F (36.5 C) (Temporal)  Ht 5' 6"  (1.676 m)  Wt 234 lb (106.142 kg)  BMI 37.77 kg/m2   General: Well-nourished, well-developed in no acute distress.   Eyes: No icterus. Mouth: Oropharyngeal mucosa moist and pink , no lesions erythema or exudate. Lungs: Clear to auscultation bilaterally.   Heart: Regular rate and rhythm, no murmurs rubs or gallops.   Abdomen: Bowel sounds are normal. Ileostomy bag in right mid abdomen. Stoma red. Several small erosions noted. Watery yellow stool present. Tender throughout abd but no rebound or guarding. No hepatosplenomegaly or masses, no abdominal bruits or hernia.    Extremities: No lower extremity edema. No clubbing or deformities. Neuro: Alert and oriented x 4    Skin: Warm and dry, no jaundice.    Psych: Alert and cooperative, normal mood and affect.     Barney Drain, MD  05/17/2011  1:59 PM  Signed Pt needs sb imaging.  [name]  05/17/2011  4:25 PM  Signed Cc to PCP  Neil Crouch, PA  05/19/2011  2:36 PM  Signed Discussed with Dr. Oneida Alar. Pt called in with c/o melena today. Please schedule CBC for Monday. Schedule for ileoscopy. No bowel prep needed except begin clear liquids at 6pm the night before. If H/H drop we will also add on EGD. Check stool for Giardia EIA. Stop Celebrex.  Neil Crouch, PA  05/24/2011 11:00 AM  Signed Quick Note:   Please let patient know his AST/ALT are up some. They were up some 9 months ago as  well. Please arrange for the following:   1. Hep B surface Antigen. 2. Hep C antibody. 3. Iron and tibc 4. Ferritin 5. Quantitative immunoglobulins. 6. ANA 7. AMA 8. Smooth muscle antibody. 9. Abd u/s ______  Danny Lawless [facility]  05/26/2011  9:04 AM  Signed Quick Note:   Pt is scheduled for u/s on 08/27/ @ 9:00- pt is aware ______  Neil Crouch, PA  05/26/2011  1:11 PM  Signed Did pt get schedule for ileoscopy??        CROHN'S DISEASE, LARGE INTESTINE - Neil Crouch, PA  05/17/2011 12:19 PM  Signed H/O Crohn's colitis s/p total colectomy with end ileostomy in 2009 for refractory disease. H/O chronic NSAID use, d/c Relafen last year after melena and small bowel erosions/ulcers seen on capsule study. Now on Celebrex. ?NSAID vs SB Crohns? Patient with diarrhea and abdominal cramping as well.    Check labs, CBC/CMP.    GERD - Neil Crouch, PA  05/17/2011 12:22 PM  Signed Refractory nocturnal heartburn. Stop omeprazole. Begin Dexilant. Samples and RX provided.    [place], PA  05/17/2011 12:24 PM  Signed Check CBC today. Patient is on chronic NSAIDS. Would recommend avoiding for now, but patient reluctant. May ultimately need to resume medication for small bowel Crohn's. Will discuss with Dr. Oneida Alar.

## 2011-06-29 NOTE — Interval H&P Note (Signed)
History and Physical Interval Note:   06/29/2011   10:07 AM   [name]  has presented today for surgery, with the diagnosis of Blood in stool  The various methods of treatment have been discussed with the patient and family. After consideration of risks, benefits and other options for treatment, the patient has consented to  Procedure(s): ILEOSCOPY THROUGH STOMA as a surgical intervention .  I have reviewed the patients' chart and labs.  Questions were answered to the patient's satisfaction.     Barney Drain  MD       Primary Care Physician:  Marval Regal, MD Primary Gastroenterologist:  Dr. Oneida Alar  Pre-Procedure History & Physical: HPI:  [name] is a 45 y.o. male here for melena  Past Medical History  Diagnosis Date  . Crohn's colitis     s/p total colectomy with ileostomy in 2009  . Nystagmus   . Bipolar disorder   . Asthma   . GERD (gastroesophageal reflux disease)   . Hyperlipidemia   . Insomnia   . Enterococcus UTI 2009  . 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 DVT (deep venous thrombosis) 2009    right upper extremity due to PICC    Past Surgical History  Procedure Date  . Total colectomy 2009    with ileostomy at [facility] for refractory disease   . Total shoulder replacement     bilateral  . Total hip arthroplasty     right, due to avascular necrosis from chronic prednisone  . Kidney surgery   . Appendectomy   . Eye surgery   . Esophagogastroduodenoscopy 07/2010    gastritis,  bx neg for H.Pylori  . Small bowel capsule study 08/2010    few tiny nonbleeding erosions/ulcers ?secondary to relafen or Crohn's. Entire small bowel not seen.     Prior to Admission medications   Medication Sig Start Date End Date Taking? Authorizing Provider  acetaminophen (TYLENOL) 500 MG tablet Take 1,000 mg by mouth every 6 (six) hours as needed. For pain    Yes Historical Provider, MD  benztropine (COGENTIN) 1 MG  tablet Take 1 mg by mouth 2 (two) times daily.     Yes Historical Provider, MD  calcium-vitamin D (OSCAL WITH D) 500-200 MG-UNIT per tablet Take 1 tablet by mouth 2 (two) times daily.    Yes Historical Provider, MD  cetirizine (ZYRTEC) 10 MG tablet Take 10 mg by mouth daily.     Yes Historical Provider, MD  darifenacin (ENABLEX) 7.5 MG 24 hr tablet Take 7.5 mg by mouth daily.     Yes Historical Provider, MD  divalproex (DEPAKOTE) 500 MG 24 hr tablet Take 1,500 mg by mouth daily.    Yes Historical Provider, MD  esomeprazole (NEXIUM) 40 MG capsule Take 40 mg by mouth daily.     Yes Historical Provider, MD  folic acid (FOLVITE) 1 MG tablet Take 1 mg by mouth daily.     Yes Historical Provider, MD  furosemide (LASIX) 40 MG tablet Take 40 mg by mouth daily.     Yes Historical Provider, MD  gabapentin (NEURONTIN) 300 MG capsule Take 900 mg by mouth daily.    Yes Historical Provider, MD  methotrexate (RHEUMATREX) 2.5 MG tablet Take 15 mg by mouth once a week. Caution:Chemotherapy. Protect from light.   Yes Historical Provider, MD  metoprolol succinate (TOPROL-XL) 25 MG 24 hr tablet Take 12.5 mg by mouth daily at 2  PM daily at 2 PM.    Yes Historical Provider, MD  pravastatin (PRAVACHOL) 20 MG tablet Take 20 mg by mouth daily.     Yes Historical Provider, MD  risperiDONE (RISPERDAL) 2 MG tablet Take 7 mg by mouth daily.    Yes Historical Provider, MD  rOPINIRole (REQUIP) 2 MG tablet Take 2 mg by mouth at bedtime.     Yes Historical Provider, MD  Tamsulosin HCl (FLOMAX) 0.4 MG CAPS Take 0.4 mg by mouth daily.     Yes Historical Provider, MD  benzonatate (TESSALON) 100 MG capsule Take 100 mg by mouth 3 (three) times daily as needed.      Historical Provider, MD  celecoxib (CELEBREX) 200 MG capsule Take 200 mg by mouth 2 (two) times daily.      Historical Provider, MD    Allergies as of 06/08/2011 - Review Complete 05/17/2011  Allergen Reaction Noted  . Aspirin    . Codeine    . Ibuprofen    .  Penicillins    . Tramadol hcl    . Vicodin (hydrocodone-acetaminophen) Nausea Only 05/17/2011    History reviewed. No pertinent family history.  History   Social History  . Marital Status: Single    Spouse Name: N/A    Number of Children: N/A  . Years of Education: N/A   Occupational History  . Not on file.   Social History Main Topics  . Smoking status: Former Research scientist (life sciences)  . Smokeless tobacco: Not on file  . Alcohol Use: No  . Drug Use: No  . Sexually Active: Not on file   Other Topics Concern  . Not on file   Social History Narrative   Girlfriend of six years, Rectal Cancer on Hospice (05/2011).    Review of Systems: See HPI, otherwise negative ROS   Physical Exam: BP 130/76  Pulse 81  Temp(Src) 97.9 F (36.6 C) (Oral)  Resp 18  Ht 5' 6"  (1.676 m)  Wt 233 lb (105.688 kg)  BMI 37.61 kg/m2  SpO2 98% General:   Alert,  pleasant and cooperative in NAD Head:  Normocephalic and atraumatic. Neck:  Supple; no masses or thyromegaly. Lungs:  Clear throughout to auscultation.    Heart:  Regular rate and rhythm. Abdomen:  Soft, nontender and nondistended. Normal bowel sounds, without guarding, and without rebound.   Neurologic:  Alert and  oriented x4;  grossly normal neurologically.  Impression/Plan:   Pt has melena.  PLAN: ILEOSCOPE +/-EGD today

## 2011-06-29 NOTE — OR Nursing (Addendum)
1035 colon scope out; changing to upper scope - for ileoscopy via stoma. 1038 upper scope in for ileoscopy

## 2011-07-01 ENCOUNTER — Telehealth: Payer: Self-pay

## 2011-07-01 LAB — CULTURE, BLOOD (ROUTINE X 2)
Culture: NO GROWTH
Report Status: 6192009

## 2011-07-01 LAB — CROSSMATCH
ABO/RH(D): A POS
ABO/RH(D): A POS
Antibody Screen: NEGATIVE

## 2011-07-01 LAB — DIFFERENTIAL
Basophils Absolute: 0
Basophils Absolute: 0
Basophils Relative: 0
Basophils Relative: 1
Eosinophils Absolute: 0
Eosinophils Absolute: 0.1
Eosinophils Absolute: 0.1
Eosinophils Absolute: 0
Eosinophils Absolute: 0.1
Eosinophils Relative: 5
Eosinophils Relative: 0
Eosinophils Relative: 2
Lymphocytes Relative: 31
Lymphocytes Relative: 39
Lymphs Abs: 1
Lymphs Abs: 1
Lymphs Abs: 1.2
Lymphs Abs: 1.3
Lymphs Abs: 0.5 — ABNORMAL LOW
Lymphs Abs: 1.6
Monocytes Absolute: 0.2
Monocytes Absolute: 0.3
Monocytes Absolute: 0.3
Monocytes Absolute: 1.6 — ABNORMAL HIGH
Monocytes Absolute: 0.1
Monocytes Absolute: 1.1 — ABNORMAL HIGH
Monocytes Relative: 10
Monocytes Relative: 6
Monocytes Relative: 2 — ABNORMAL LOW
Neutro Abs: 1.3 — ABNORMAL LOW
Neutro Abs: 11.6 — ABNORMAL HIGH
Neutro Abs: 2.1
Neutrophils Relative %: 48
Neutrophils Relative %: 79 — ABNORMAL HIGH

## 2011-07-01 LAB — COMPREHENSIVE METABOLIC PANEL
ALT: 43
ALT: 10
AST: 130 — ABNORMAL HIGH
AST: 23
AST: 54 — ABNORMAL HIGH
AST: 74 — ABNORMAL HIGH
AST: 14
Albumin: 1.6 — ABNORMAL LOW
Albumin: 2.2 — ABNORMAL LOW
Albumin: 1.7 — ABNORMAL LOW
BUN: 2 — ABNORMAL LOW
BUN: 6
CO2: 26
CO2: 28
CO2: 23
Calcium: 7.4 — ABNORMAL LOW
Calcium: 7.8 — ABNORMAL LOW
Chloride: 107
Chloride: 108
Chloride: 93 — ABNORMAL LOW
Chloride: 101
Creatinine, Ser: 0.76
Creatinine, Ser: 1.04
Creatinine, Ser: 1.35
GFR calc Af Amer: >60
GFR calc Af Amer: >60
GFR calc Af Amer: >60
GFR calc Af Amer: >60
GFR calc Af Amer: >60
GFR calc non Af Amer: 58 — ABNORMAL LOW
GFR calc non Af Amer: >60
GFR calc non Af Amer: >60
Glucose, Bld: 77
Sodium: 142
Sodium: 133 — ABNORMAL LOW
Total Bilirubin: 0.8
Total Bilirubin: 0.8
Total Bilirubin: 0.9
Total Protein: 4 — ABNORMAL LOW
Total Protein: 5.2 — ABNORMAL LOW

## 2011-07-01 LAB — BASIC METABOLIC PANEL
BUN: 2 — ABNORMAL LOW
CO2: 21
CO2: 24
Calcium: 7.3 — ABNORMAL LOW
Calcium: 7.7 — ABNORMAL LOW
Chloride: 102
Chloride: 114 — ABNORMAL HIGH
Creatinine, Ser: 0.79
Creatinine, Ser: 0.76
GFR calc Af Amer: >60
GFR calc Af Amer: >60
GFR calc non Af Amer: >60
Glucose, Bld: 109 — ABNORMAL HIGH
Glucose, Bld: 87
Potassium: 3.9
Potassium: 4.3
Sodium: 135
Sodium: 138
Sodium: 142

## 2011-07-01 LAB — CBC
HCT: 20.6 — ABNORMAL LOW
HCT: 25.1 — ABNORMAL LOW
HCT: 26 — ABNORMAL LOW
HCT: 29 — ABNORMAL LOW
HCT: 34.8 — ABNORMAL LOW
Hemoglobin: 7.3 — CL
Hemoglobin: 9.3 — ABNORMAL LOW
Hemoglobin: 10.3 — ABNORMAL LOW
Hemoglobin: 11.6 — ABNORMAL LOW
MCHC: 35.2
MCHC: 33.3
MCHC: 33.3
MCV: 87.6
MCV: 87.8
MCV: 88.8
MCV: 90.5
MCV: 92.1
MCV: 94.2
MCV: 94.3
Platelets: 150
Platelets: 173
Platelets: 102 — ABNORMAL LOW
Platelets: 237
RBC: 2.35 — ABNORMAL LOW
RBC: 2.94 — ABNORMAL LOW
RBC: 3.15 — ABNORMAL LOW
RBC: 3.29 — ABNORMAL LOW
RBC: 3.34 — ABNORMAL LOW
RBC: 3.69 — ABNORMAL LOW
RDW: 18.6 — ABNORMAL HIGH
RDW: 18.6 — ABNORMAL HIGH
WBC: 14.6 — ABNORMAL HIGH
WBC: 2.7 — ABNORMAL LOW
WBC: 2.8 — ABNORMAL LOW
WBC: 5.3
WBC: 6.3
WBC: 7.9

## 2011-07-01 LAB — HEMOGLOBIN AND HEMATOCRIT, BLOOD
HCT: 29 — ABNORMAL LOW
HCT: 31 — ABNORMAL LOW
HCT: 32.4 — ABNORMAL LOW
HCT: 35 — ABNORMAL LOW
Hemoglobin: 10.4 — ABNORMAL LOW
Hemoglobin: 11.1 — ABNORMAL LOW
Hemoglobin: 11.6 — ABNORMAL LOW
Hemoglobin: 9.7 — ABNORMAL LOW

## 2011-07-01 LAB — URINALYSIS, ROUTINE W REFLEX MICROSCOPIC
Bilirubin Urine: NEGATIVE
Hgb urine dipstick: NEGATIVE
Ketones, ur: NEGATIVE
Protein, ur: NEGATIVE
Urobilinogen, UA: 0.2

## 2011-07-01 LAB — PROTIME-INR
INR: 1.2
INR: 1.4
INR: 2.4 — ABNORMAL HIGH
Prothrombin Time: 24.6 — ABNORMAL HIGH

## 2011-07-01 LAB — URINE MICROSCOPIC-ADD ON

## 2011-07-01 LAB — APTT
aPTT: 42 — ABNORMAL HIGH
aPTT: 34

## 2011-07-01 LAB — PREPARE FRESH FROZEN PLASMA

## 2011-07-01 LAB — URINE CULTURE

## 2011-07-01 NOTE — Telephone Encounter (Signed)
Pt was informed. Rx's called to [facility] @ [employer] in [place].

## 2011-07-01 NOTE — Telephone Encounter (Signed)
Gastroenterology Phone Call Form  Primary Physician: Dr. Oneida Alar   1) Reason for phone call: Abdominal cramping   2) When did this start? Started last night   3) Have you been seen for this here before?  no   4) If Pain, where is it located?  the abdomen diffusely   5) Rate your pain/discomfort on scale 1-10? 5   6) Have you tried any medication for this?no   7) Do you have any trouble breathing, chest pain, vomiting blood, passing bloody stools, feel dehydrated, dizzy or have a high fever?no Just a little diarrhea but not bloody  If yes to #7 above, I have paged/informed Appointment has been made as follows: Provider:  Date:  Time:  I offered pt an appt today, but he has to know in advance for a ride.

## 2011-07-01 NOTE — Telephone Encounter (Signed)
Had procedure yesterday, she is in process of addressing biopsies and considering mesalamine. Discuss with Dr. Oneida Alar.

## 2011-07-01 NOTE — Telephone Encounter (Signed)
Please call pt. He has ulcers in his small bowel most likely 2o to Crohn's disease. He should take Prednisone 20 mg daily for 2 weeks and then 10 mg daily for 2 weeks, #qs, rfx0. He should start Pentasa 1 gm tid, #90, rfx11. OPV in 6 weeks.  If his abd pain continues he needs to go to the ED.

## 2011-07-02 LAB — TYPE AND SCREEN: Antibody Screen: NEGATIVE

## 2011-07-02 LAB — BASIC METABOLIC PANEL
BUN: 8
CO2: 26
Calcium: 8.2 — ABNORMAL LOW
Creatinine, Ser: 0.85
GFR calc non Af Amer: >60
Glucose, Bld: 76
Sodium: 136

## 2011-07-02 LAB — URINALYSIS, ROUTINE W REFLEX MICROSCOPIC
Ketones, ur: NEGATIVE
Leukocytes, UA: NEGATIVE
Nitrite: NEGATIVE
Specific Gravity, Urine: 1.02
Urobilinogen, UA: 0.2
pH: 6

## 2011-07-02 LAB — CBC
HCT: 26.3 — ABNORMAL LOW
MCV: 91.9
RBC: 2.86 — ABNORMAL LOW
WBC: 12.5 — ABNORMAL HIGH

## 2011-07-02 LAB — URINE MICROSCOPIC-ADD ON

## 2011-07-02 LAB — RAPID URINE DRUG SCREEN, HOSP PERFORMED
Amphetamines: NOT DETECTED
Benzodiazepines: NOT DETECTED
Opiates: POSITIVE — AB
Tetrahydrocannabinol: NOT DETECTED

## 2011-07-02 LAB — DIFFERENTIAL
Eosinophils Absolute: 0.1
Eosinophils Relative: 1
Lymphs Abs: 1.8
Monocytes Absolute: 0.9
Monocytes Relative: 7

## 2011-07-07 ENCOUNTER — Encounter (HOSPITAL_COMMUNITY): Payer: Self-pay | Admitting: Gastroenterology

## 2011-07-09 LAB — CBC
Hemoglobin: 9.9 — ABNORMAL LOW
RBC: 3.67 — ABNORMAL LOW

## 2011-07-09 LAB — DIFFERENTIAL
Basophils Absolute: 0
Lymphocytes Relative: 26
Monocytes Absolute: 1.1 — ABNORMAL HIGH
Monocytes Relative: 11
Neutro Abs: 6

## 2011-07-12 LAB — DIFFERENTIAL
Basophils Absolute: 0
Basophils Absolute: 0
Basophils Relative: 0
Basophils Relative: 0
Eosinophils Absolute: 0.1 — ABNORMAL LOW
Eosinophils Absolute: 1.1 — ABNORMAL HIGH
Eosinophils Relative: 1
Eosinophils Relative: 5
Eosinophils Relative: 5
Lymphocytes Relative: 14
Lymphocytes Relative: 16
Lymphocytes Relative: 17
Lymphs Abs: 1.9
Lymphs Abs: 1.9
Monocytes Absolute: 0.8
Monocytes Absolute: 0.9
Monocytes Absolute: 1
Monocytes Relative: 10
Monocytes Relative: 12
Monocytes Relative: 8
Neutro Abs: 6.3
Neutro Abs: 7.4
Neutrophils Relative %: 63

## 2011-07-12 LAB — BASIC METABOLIC PANEL
BUN: 2 — ABNORMAL LOW
CO2: 28
CO2: 31
Calcium: 7.3 — ABNORMAL LOW
Calcium: 7.9 — ABNORMAL LOW
Chloride: 105
Creatinine, Ser: 0.85
Creatinine, Ser: 0.97
GFR calc Af Amer: >60
GFR calc Af Amer: >60
GFR calc Af Amer: >60
GFR calc non Af Amer: >60
GFR calc non Af Amer: >60
Glucose, Bld: 76
Glucose, Bld: 92
Potassium: 3.4 — ABNORMAL LOW
Potassium: 3.6
Sodium: 138
Sodium: 141
Sodium: 142

## 2011-07-12 LAB — CBC
HCT: 27.6 — ABNORMAL LOW
HCT: 31.7 — ABNORMAL LOW
HCT: 32.9 — ABNORMAL LOW
HCT: 34 — ABNORMAL LOW
HCT: 36.7 — ABNORMAL LOW
Hemoglobin: 10.2 — ABNORMAL LOW
Hemoglobin: 10.8 — ABNORMAL LOW
Hemoglobin: 10.9 — ABNORMAL LOW
Hemoglobin: 11.8 — ABNORMAL LOW
Hemoglobin: 8.9 — ABNORMAL LOW
MCHC: 32.2
MCHC: 32.2
MCV: 83
MCV: 83.9
Platelets: 390
Platelets: 435 — ABNORMAL HIGH
Platelets: 498 — ABNORMAL HIGH
RBC: 3.26 — ABNORMAL LOW
RBC: 3.77 — ABNORMAL LOW
RBC: 3.99 — ABNORMAL LOW
RBC: 4.01 — ABNORMAL LOW
RBC: 4.1 — ABNORMAL LOW
RDW: 17.3 — ABNORMAL HIGH
RDW: 20.5 — ABNORMAL HIGH
RDW: 20.5 — ABNORMAL HIGH
WBC: 11 — ABNORMAL HIGH
WBC: 11.9 — ABNORMAL HIGH
WBC: 9.3

## 2011-07-12 LAB — OVA AND PARASITE EXAMINATION

## 2011-07-12 LAB — CROSSMATCH

## 2011-07-12 LAB — FOLATE: Folate: 7.1

## 2011-07-12 LAB — URINE CULTURE

## 2011-07-12 LAB — VITAMIN B12: Vitamin B-12: 408 (ref 211–911)

## 2011-07-12 LAB — FERRITIN: Ferritin: 19 — ABNORMAL LOW (ref 22–322)

## 2011-07-12 LAB — OCCULT BLOOD X 1 CARD TO LAB, STOOL: Fecal Occult Bld: POSITIVE

## 2011-07-12 LAB — PROTIME-INR: Prothrombin Time: 13.5

## 2011-07-12 LAB — STOOL CULTURE

## 2011-07-12 LAB — IRON AND TIBC
Saturation Ratios: 45
TIBC: 159 — ABNORMAL LOW
UIBC: 88

## 2011-07-12 LAB — CLOSTRIDIUM DIFFICILE EIA

## 2011-07-15 LAB — CROSSMATCH
ABO/RH(D): A POS
Antibody Screen: NEGATIVE

## 2011-07-15 LAB — ABO/RH: ABO/RH(D): A POS

## 2011-07-19 ENCOUNTER — Ambulatory Visit (INDEPENDENT_AMBULATORY_CARE_PROVIDER_SITE_OTHER): Payer: Medicaid Other | Admitting: Gastroenterology

## 2011-07-19 ENCOUNTER — Encounter: Payer: Self-pay | Admitting: Gastroenterology

## 2011-07-19 VITALS — BP 117/78 | HR 87 | Temp 98.3°F | Ht 66.0 in | Wt 229.0 lb

## 2011-07-19 DIAGNOSIS — K219 Gastro-esophageal reflux disease without esophagitis: Secondary | ICD-10-CM

## 2011-07-19 DIAGNOSIS — K760 Fatty (change of) liver, not elsewhere classified: Secondary | ICD-10-CM

## 2011-07-19 DIAGNOSIS — K50819 Crohn's disease of both small and large intestine with unspecified complications: Secondary | ICD-10-CM | POA: Insufficient documentation

## 2011-07-19 DIAGNOSIS — K5000 Crohn's disease of small intestine without complications: Secondary | ICD-10-CM

## 2011-07-19 DIAGNOSIS — K7689 Other specified diseases of liver: Secondary | ICD-10-CM

## 2011-07-19 MED ORDER — ESOMEPRAZOLE MAGNESIUM 40 MG PO CPDR: 40.0000 mg | DELAYED_RELEASE_CAPSULE | Freq: Every day | ORAL | Status: DC

## 2011-07-19 NOTE — Assessment & Plan Note (Signed)
Recent EGD on file, reassuring. Complains of intermittent nocturnal reflux. Will increase to BID x 1 month, then back to once daily. Continue wt loss efforts. Avoid tough textures due to lack of dentures. Likely did not chew thoroughly (BBQ chicken). No further work-up unless dysphagia continues.

## 2011-07-19 NOTE — Assessment & Plan Note (Signed)
Thorough work-up including labs and [place] completed. HFP in 6 mos. Continue low-fat/cholesterol diet. Wt loss applauded and encouraged to continue.

## 2011-07-19 NOTE — Patient Instructions (Addendum)
Increase Nexium to twice a day for about a month, then decrease back to once daily.  We will see you back in 3 months.   Please contact us if your pain continues. I will be discussing with Dr. Oneida Alar further plans.

## 2011-07-19 NOTE — Progress Notes (Signed)
Cc to PCP 

## 2011-07-19 NOTE — Progress Notes (Signed)
Referring Provider: Marval Regal, MD Primary Care Physician:  Marval Regal, MD Primary Gastroenterologist: Dr. Oneida Alar   Chief Complaint  Patient presents with  . Rectal Bleeding  . Diarrhea  . Dysphagia    some foods    HPI:   [name] returns today in follow-up for hx of Crohn's colitis, s/p total proctocolectomy with end ileostomy in 2009 at [facility]. He has had an EGD and capsule endoscopy as of last year and was informed to stop Relafen. He presented several weeks ago with melena, increased watery stools, and abdominal cramping. An EGD and ileoscopy was performed by Dr. Oneida Alar, showing mild gastritis, small hiatal hernia, no ulcers. Small bowel endoscopy showed multiple ulcers, likely secondary to Crohn's. He was started on Prednisone taper as well as Pentasa 1gram TID.  He returns today on third week of Prednisone, down to 10 mg. Has 1 additional week left of taper. Tolerating Pentasa. Reports decreased stools. Reports ate BBQ chicken yesterday for his birthday and it got stuck. He has no teeth or dentures. His girlfriend is moved into a nursing home due to cancer. He will be moving into [facility] as of Nov 1. He is looking forward to this, as he has friends there.  Reports continued intermittent abdominal cramping to the left of the umbilicus. Lasts 30 minutes to hours. No precipitating or alleviating factors. Not worse from before, just the same. No blood stools. Denies fever or chills. Occasional heartburn at night but not eating late. Down 5 lbs from August. Eating low-fat/low cholesterol diet.   Recent work-up for elevated LFTs performed. [place] with fatty liver. Likely due to fatty liver, repeat HFP in 6 mos.    Past Medical History  Diagnosis Date  . Crohn's colitis     s/p total colectomy with ileostomy in 2009  . Nystagmus   . Bipolar disorder   . Asthma   . GERD (gastroesophageal reflux disease)   . Hyperlipidemia   . Insomnia   . Enterococcus UTI 2009  .  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 DVT (deep venous thrombosis) 2009    right upper extremity due to PICC  . S/P endoscopy Sept 2012    mild gastritis, small hiatal hernia, no ulcers  . Crohn's disease of small intestine Sept 2012    ileoscopy: multiple ulcers likely secondary to Crohn's    Past Surgical History  Procedure Date  . Total colectomy 2009    with ileostomy at [facility] for refractory disease   . Total shoulder replacement     bilateral  . Total hip arthroplasty     right, due to avascular necrosis from chronic prednisone  . Kidney surgery   . Appendectomy   . Eye surgery   . Esophagogastroduodenoscopy 07/2010    gastritis,  bx neg for H.Pylori  . Small bowel capsule study 08/2010    few tiny nonbleeding erosions/ulcers ?secondary to relafen or Crohn's. Entire small bowel not seen.   . Ileoscopy 06/29/2011    Procedure: ILEOSCOPY THROUGH STOMA;  Surgeon: Dorothyann Peng, MD;  Location: [facility];  Service: Endoscopy;  Laterality: N/A;  9:45    Current Outpatient Prescriptions  Medication Sig Dispense Refill  . acetaminophen (TYLENOL) 500 MG tablet Take 1,000 mg by mouth every 6 (six) hours as needed. For pain       . acetaminophen-codeine (TYLENOL #3) 300-30 MG per tablet Take 1 tablet by mouth every 4 (  four) hours as needed.        . benztropine (COGENTIN) 1 MG tablet Take 1 mg by mouth 2 (two) times daily.        . calcium-vitamin D (OSCAL WITH D) 500-200 MG-UNIT per tablet Take 1 tablet by mouth 2 (two) times daily.       . cetirizine (ZYRTEC) 10 MG tablet Take 10 mg by mouth daily.        Marland Kitchen darifenacin (ENABLEX) 7.5 MG 24 hr tablet Take 7.5 mg by mouth daily.        . divalproex (DEPAKOTE) 500 MG 24 hr tablet Take 1,500 mg by mouth daily.       Marland Kitchen esomeprazole (NEXIUM) 40 MG capsule Take 40 mg by mouth daily.        . folic acid (FOLVITE) 1 MG tablet Take 1 mg by mouth daily.        . furosemide (LASIX) 40 MG  tablet Take 40 mg by mouth daily.        Marland Kitchen gabapentin (NEURONTIN) 300 MG capsule Take 900 mg by mouth daily.       . mesalamine (PENTASA) 250 MG CR capsule Take 250 mg by mouth 4 (four) times daily.        . methotrexate (RHEUMATREX) 2.5 MG tablet Take 15 mg by mouth once a week. Caution:Chemotherapy. Protect from light.      . metoprolol succinate (TOPROL-XL) 25 MG 24 hr tablet Take 12.5 mg by mouth daily at 2 PM daily at 2 PM.       . pravastatin (PRAVACHOL) 20 MG tablet Take 20 mg by mouth daily.        . predniSONE (DELTASONE) 10 MG tablet Take 10 mg by mouth daily.        . risperiDONE (RISPERDAL) 2 MG tablet Take 7 mg by mouth daily.       Marland Kitchen rOPINIRole (REQUIP) 2 MG tablet Take 2 mg by mouth at bedtime.        . Tamsulosin HCl (FLOMAX) 0.4 MG CAPS Take 0.4 mg by mouth daily.          Allergies as of 07/19/2011 - Review Complete 07/19/2011  Allergen Reaction Noted  . Aspirin    . Codeine    . Ibuprofen    . Penicillins    . Tramadol hcl    . Vicodin (hydrocodone-acetaminophen) Nausea Only 05/17/2011    No family history on file.  History   Social History  . Marital Status: Single    Spouse Name: N/A    Number of Children: N/A  . Years of Education: N/A   Social History Main Topics  . Smoking status: Former Research scientist (life sciences)  . Smokeless tobacco: None  . Alcohol Use: No  . Drug Use: No  . Sexually Active: None   Other Topics Concern  . None   Social History Narrative   Girlfriend of six years, Rectal Cancer on Hospice (05/2011).    Review of Systems: Gen: Denies fever, chills, anorexia. Denies fatigue, weakness, unintentional weight loss.  CV: Denies chest pain, palpitations, syncope, peripheral edema, and claudication. Resp: Denies dyspnea at rest, cough, wheezing, coughing up blood, and pleurisy. GI: Denies vomiting blood, jaundice, and fecal incontinence.   Denies dysphagia or odynophagia. Derm: Denies rash, itching, dry skin Psych: Denies depression, anxiety, memory  loss, confusion. No homicidal or suicidal ideation.  Heme: Denies bruising, bleeding, and enlarged lymph nodes.  Physical Exam: BP 117/78  Pulse 87  Temp(Src) 98.3 F (  36.8 C) (Temporal)  Ht 5' 6"  (1.676 m)  Wt 229 lb (103.874 kg)  BMI 36.96 kg/m2 General:   Alert and oriented. No distress noted. Pleasant and cooperative.  Head:  Normocephalic and atraumatic. Eyes:  Nystagmus noted, no icterus Mouth:  Oral mucosa pink and moist. No lesions, no teeth, no dentures Heart:  S1, S2 present without murmurs, rubs, or gallops. Regular rate and rhythm. Abdomen:  +BS, soft, mildly tender to left of umbilicus, and non-distended. No rebound or guarding. No HSM or masses noted. Ileostomy in place with red stoma, brown stool in bag Msk:  Symmetrical without gross deformities. Normal posture. Extremities:  Without edema. Neurologic:  Alert and  oriented x4;  grossly normal neurologically. Skin:  Intact without significant lesions or rashes. Psych:  Alert and cooperative. Normal mood and affect.

## 2011-07-19 NOTE — Assessment & Plan Note (Signed)
45 year old with hx of Crohn's colitis, s/p proctocolectomy and ileostomy placed. Recent evaluation via ileoscopy showed multiple ulcers consistent with Crohn's. Started on Prednisone taper, will finish in approximately 2 weeks. Doing well on Pentasa TID. Symptoms of loose, runny stools significantly improved. Continues to report left-sided abdominal cramping, lasting 30 minutes to hours at a time. No precipitating or alleviating factors. No fever or chills. Last CT in 2010. Question need for further imaging. Will d/w Dr. Oneida Alar.   Continue prednisone taper Continue Pentasa D/W Dr. Oneida Alar ongoing abd discomfort Return in a few weeks for regularly scheduled appt

## 2011-07-20 NOTE — Progress Notes (Signed)
REVIEWED. AGREE.  TAPER PREDNISONE AND CONTINUE PENTASA.

## 2011-08-03 ENCOUNTER — Ambulatory Visit: Payer: Medicaid Other | Admitting: Gastroenterology

## 2011-08-11 ENCOUNTER — Encounter: Payer: Self-pay | Admitting: Gastroenterology

## 2011-08-11 ENCOUNTER — Ambulatory Visit (INDEPENDENT_AMBULATORY_CARE_PROVIDER_SITE_OTHER): Payer: Medicaid Other | Admitting: Gastroenterology

## 2011-08-11 DIAGNOSIS — K5000 Crohn's disease of small intestine without complications: Secondary | ICD-10-CM

## 2011-08-11 NOTE — Progress Notes (Signed)
Cc to PCP 

## 2011-08-11 NOTE — Progress Notes (Signed)
Subjective:    Patient ID: [name], male    DOB: July 05, 1966, 45 y.o.   MRN: [id number]  HPI No nauseA or vomiting. Appetite is good. Chaning bag WeDs and Suns and occasionally in between. Now has dark liquid in bag. Last time saw blood was over a month. Lats abd pain couple days ago: moderate, lasted maybe 20 mins, located right around the stomaCh. RARE STOMACH CRAMPS.  Past Medical History  Diagnosis Date  . Crohn's colitis     s/p total colectomy with ileostomy in 2009  . Nystagmus   . Bipolar disorder   . Asthma   . GERD (gastroesophageal reflux disease)   . Hyperlipidemia   . Insomnia   . Enterococcus UTI 2009  . 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 DVT (deep venous thrombosis) 2009    right upper extremity due to PICC  . S/P endoscopy Sept 2012    mild gastritis, small hiatal hernia, no ulcers  . Crohn's disease of small intestine Sept 2012    ileoscopy: multiple ulcers likely secondary to Crohn's    Past Surgical History  Procedure Date  . Total colectomy 2009    with ileostomy at [facility] for refractory disease   . Total shoulder replacement     bilateral  . Total hip arthroplasty     right, due to avascular necrosis from chronic prednisone  . Kidney surgery   . Appendectomy   . Eye surgery   . Esophagogastroduodenoscopy 07/2010    gastritis,  bx neg for H.Pylori  . Small bowel capsule study 08/2010    few tiny nonbleeding erosions/ulcers ?secondary to relafen or Crohn's. Entire small bowel not seen.   . Ileoscopy 06/29/2011    Procedure: ILEOSCOPY THROUGH STOMA;  Surgeon: Dorothyann Peng, MD;  Location: [facility];  Service: Endoscopy;  Laterality: N/A;  9:45    Allergies  Allergen Reactions  . Aspirin     REACTION: unknown reaction  . Codeine     REACTION: unknown reaction  . Ibuprofen     REACTION: unknown reaction  . Penicillins     REACTION: unknown reaction  . Tramadol Hcl     REACTION:  unknown reaction  . Vicodin (Hydrocodone-Acetaminophen) Nausea Only    Current Outpatient Prescriptions  Medication Sig Dispense Refill  . acetaminophen (TYLENOL) 500 MG tablet Take 1,000 mg by mouth every 6 (six) hours as needed. For pain       . acetaminophen-codeine (TYLENOL #3) 300-30 MG per tablet Take 1 tablet by mouth every 4 (four) hours as needed.        . benztropine (COGENTIN) 1 MG tablet Take 1 mg by mouth 2 (two) times daily.        . calcium-vitamin D (OSCAL WITH D) 500-200 MG-UNIT per tablet Take 1 tablet by mouth 2 (two) times daily.       . cetirizine (ZYRTEC) 10 MG tablet Take 10 mg by mouth daily.        Marland Kitchen darifenacin (ENABLEX) 7.5 MG 24 hr tablet Take 7.5 mg by mouth daily.        . divalproex (DEPAKOTE) 500 MG 24 hr tablet Take 1,500 mg by mouth daily.       Marland Kitchen esomeprazole (NEXIUM) 40 MG capsule Take 40 mg by mouth daily. Take twice a day       . folic acid (FOLVITE) 1 MG tablet Take 1 mg by mouth daily.        Marland Kitchen  furosemide (LASIX) 40 MG tablet Take 40 mg by mouth daily.        Marland Kitchen gabapentin (NEURONTIN) 300 MG capsule Take 900 mg by mouth daily.       . mesalamine (PENTASA) 250 MG CR capsule Take 250 mg by mouth 4 (four) times daily. Taking 4 qid      . methotrexate (RHEUMATREX) 2.5 MG tablet Take 15 mg by mouth once a week. Caution:Chemotherapy. Protect from light.      . metoprolol succinate (TOPROL-XL) 25 MG 24 hr tablet Take 12.5 mg by mouth daily at 2 PM daily at 2 PM.       . pravastatin (PRAVACHOL) 20 MG tablet Take 20 mg by mouth daily.        . risperiDONE (RISPERDAL) 2 MG tablet Take 7 mg by mouth daily.       Marland Kitchen rOPINIRole (REQUIP) 2 MG tablet Take 2 mg by mouth at bedtime.        . Tamsulosin HCl (FLOMAX) 0.4 MG CAPS Take 0.4 mg by mouth daily.            Review of Systems     Objective:   Physical Exam  Vitals reviewed. Constitutional: He is oriented to person, place, and time. He appears well-developed and well-nourished. No distress.  HENT:  Head:  Normocephalic and atraumatic.  Mouth/Throat: Oropharynx is clear and moist. No oropharyngeal exudate.  Eyes: Pupils are equal, round, and reactive to light. No scleral icterus.  Cardiovascular: Normal rate, regular rhythm and normal heart sounds.   Pulmonary/Chest: Effort normal and breath sounds normal. No respiratory distress.  Abdominal: Soft. Bowel sounds are normal. He exhibits no distension. There is tenderness (MILD PERIUMBILICAL TTP). There is no rebound and no guarding.  Neurological: He is alert and oriented to person, place, and time.       NO FOCAL DEFICITS   Psychiatric: He has a normal mood and affect.          Assessment & Plan:

## 2011-08-11 NOTE — Assessment & Plan Note (Addendum)
SX IMPROVED AFTER PREDNISONE.  WILL OBTAIN LABS FROM [facility]-DISCUSSED WITH CARE [phone]. SHE WILL FAX 11/8. CONTINUE PENTASA. IF PT REQUIRES MORE THAN 3 RX FOR STEROIDS, WILL NEED IMURAN. OPV IN 4 MOS.

## 2011-08-12 NOTE — Progress Notes (Signed)
Reminder in epic to follow up in 4 months

## 2011-09-20 ENCOUNTER — Telehealth: Payer: Self-pay

## 2011-09-20 NOTE — Telephone Encounter (Signed)
Noted  

## 2011-09-20 NOTE — Telephone Encounter (Signed)
Larene Beach from [facility] called- they did a PA for pts nexium and were denied, pt has medicaid. Advised them that pt will need to try and fail 2 of either prilosec, protonix or prevacid 31m. They have no documentation of pt trying anything else. Checked our records and pt has tried omeprazole 247mbid. She said she would get their doc to change him to protonix and then if he failed that they would change him back to nexium and do PA to get approval.

## 2011-10-14 ENCOUNTER — Emergency Department (HOSPITAL_COMMUNITY)
Admission: EM | Admit: 2011-10-14 | Discharge: 2011-10-14 | Disposition: A | Discharge status: Home / Self Care | Payer: Medicaid Other | Attending: Emergency Medicine | Admitting: Emergency Medicine

## 2011-10-14 ENCOUNTER — Ambulatory Visit (INDEPENDENT_AMBULATORY_CARE_PROVIDER_SITE_OTHER): Payer: Medicaid Other | Admitting: Gastroenterology

## 2011-10-14 ENCOUNTER — Encounter (HOSPITAL_COMMUNITY): Payer: Self-pay | Admitting: Emergency Medicine

## 2011-10-14 ENCOUNTER — Telehealth: Payer: Self-pay | Admitting: Gastroenterology

## 2011-10-14 ENCOUNTER — Encounter: Payer: Self-pay | Admitting: Gastroenterology

## 2011-10-14 ENCOUNTER — Emergency Department (HOSPITAL_COMMUNITY): Payer: Medicaid Other

## 2011-10-14 DIAGNOSIS — E785 Hyperlipidemia, unspecified: Secondary | ICD-10-CM | POA: Insufficient documentation

## 2011-10-14 DIAGNOSIS — F17200 Nicotine dependence, unspecified, uncomplicated: Secondary | ICD-10-CM | POA: Insufficient documentation

## 2011-10-14 DIAGNOSIS — K5000 Crohn's disease of small intestine without complications: Secondary | ICD-10-CM

## 2011-10-14 DIAGNOSIS — K9413 Enterostomy malfunction: Secondary | ICD-10-CM | POA: Insufficient documentation

## 2011-10-14 DIAGNOSIS — M8700 Idiopathic aseptic necrosis of unspecified bone: Secondary | ICD-10-CM | POA: Insufficient documentation

## 2011-10-14 DIAGNOSIS — F319 Bipolar disorder, unspecified: Secondary | ICD-10-CM | POA: Insufficient documentation

## 2011-10-14 DIAGNOSIS — Z933 Colostomy status: Secondary | ICD-10-CM | POA: Insufficient documentation

## 2011-10-14 DIAGNOSIS — K219 Gastro-esophageal reflux disease without esophagitis: Secondary | ICD-10-CM | POA: Insufficient documentation

## 2011-10-14 DIAGNOSIS — J45909 Unspecified asthma, uncomplicated: Secondary | ICD-10-CM | POA: Insufficient documentation

## 2011-10-14 DIAGNOSIS — M25469 Effusion, unspecified knee: Secondary | ICD-10-CM

## 2011-10-14 DIAGNOSIS — M25569 Pain in unspecified knee: Secondary | ICD-10-CM | POA: Insufficient documentation

## 2011-10-14 DIAGNOSIS — W19XXXA Unspecified fall, initial encounter: Secondary | ICD-10-CM | POA: Insufficient documentation

## 2011-10-14 DIAGNOSIS — Z86718 Personal history of other venous thrombosis and embolism: Secondary | ICD-10-CM | POA: Insufficient documentation

## 2011-10-14 DIAGNOSIS — K9403 Colostomy malfunction: Secondary | ICD-10-CM

## 2011-10-14 DIAGNOSIS — K5010 Crohn's disease of large intestine without complications: Secondary | ICD-10-CM | POA: Insufficient documentation

## 2011-10-14 LAB — DIFFERENTIAL
Basophils Absolute: 0 10*3/uL (ref 0.0–0.1)
Basophils Relative: 0 % (ref 0–1)
Lymphocytes Relative: 34 % (ref 12–46)
Monocytes Absolute: 0.7 10*3/uL (ref 0.1–1.0)
Neutro Abs: 3.9 10*3/uL (ref 1.7–7.7)
Neutrophils Relative %: 55 % (ref 43–77)

## 2011-10-14 LAB — CBC
HCT: 32.9 % — ABNORMAL LOW (ref 39.0–52.0)
MCHC: 42.2 g/dL — ABNORMAL HIGH (ref 30.0–36.0)
RDW: 14.4 % (ref 11.5–15.5)
WBC: 7.2 10*3/uL (ref 4.0–10.5)

## 2011-10-14 LAB — SEDIMENTATION RATE: Sed Rate: 0 mm/hr (ref 0–16)

## 2011-10-14 MED ORDER — CIPROFLOXACIN HCL 500 MG PO TABS: 500.0000 mg | ORAL_TABLET | Freq: Two times a day (BID) | ORAL | Status: AC

## 2011-10-14 MED ORDER — LIDOCAINE HCL (PF) 1 % IJ SOLN
INTRAMUSCULAR | Status: AC
  Filled 2011-10-14: qty 5

## 2011-10-14 MED ORDER — OXYCODONE-ACETAMINOPHEN 5-325 MG PO TABS: 1.0000 | ORAL_TABLET | ORAL | Status: AC | PRN

## 2011-10-14 MED ORDER — CIPROFLOXACIN HCL 250 MG PO TABS
500.0000 mg | ORAL_TABLET | Freq: Once | ORAL | Status: AC
  Administered 2011-10-14: 500 mg via ORAL
  Filled 2011-10-14: qty 2

## 2011-10-14 MED ORDER — OXYCODONE-ACETAMINOPHEN 5-325 MG PO TABS
2.0000 | ORAL_TABLET | Freq: Once | ORAL | Status: AC
  Administered 2011-10-14: 2 via ORAL
  Filled 2011-10-14: qty 2

## 2011-10-14 NOTE — Telephone Encounter (Signed)
REVIEWED.  

## 2011-10-14 NOTE — Progress Notes (Signed)
Cc to PCP 

## 2011-10-14 NOTE — Assessment & Plan Note (Signed)
NO OVERT EVIDENCE OF ACTIVE DISEASE.  CONTINUE PENTASA. AWAIT JAN 2013 LABS.

## 2011-10-14 NOTE — ED Notes (Addendum)
Pt c/o left knee pain/swelling x 1 month. Pt states he has fallen the past few days because it "keeps given out on him".

## 2011-10-14 NOTE — Progress Notes (Signed)
Pt is aware of OV on 3/6 @ 2pm with [facility] E30 visit and appt card was mailed

## 2011-10-14 NOTE — ED Notes (Signed)
Correction to time on Time-out form. Time should be documented as 2025. Verified by Dr. Ripley Fraise and Milana Na.Corey Harold, RN

## 2011-10-14 NOTE — Progress Notes (Signed)
Subjective:    Patient ID: [name], male    DOB: [birth year], 46 y.o.   MRN: [id number]  PCP: [facility]  HPI Having pain around ostomy site (inside ostomy border) for past 3-4 days. Seeing a little blood: only a couple of time. First time was when he wiped the stomach and this AM. CHANGING BAG EVERY 3-4 DAYS.  sTOOL MOSTLY SOFT AND BROWN. DID HAVE RUNNY STOOL LAST WEEK. No fever or chills. Had a migraine for 12 days. Taking Pentasa 250 mg 4 pills tid. Also CHANGED TO [place] due to Medicaid would make him pay $400/mo. Itching and a rash around OSTMOY site. BLOOD DRAW PENDING IN JAN 2013.  Also having problems with falling. Twice yesterday, twice the other day and four times as well. Knee just gives out. Waiting for NP from Dr. [facility]'s of[facility] to evaluate.   Past Medical History  Diagnosis Date  . Crohn's colitis     s/p total colectomy with ileostomy in 2009  . Nystagmus   . Bipolar disorder   . Asthma   . GERD (gastroesophageal reflux disease)   . Hyperlipidemia   . Insomnia   . Enterococcus UTI 2009  . 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 DVT (deep venous thrombosis) 2009    right upper extremity due to PICC  . S/P endoscopy Sept 2012    mild gastritis, small hiatal hernia, no ulcers  . Crohn's disease of small intestine Sept 2012    ileoscopy: multiple ulcers likely secondary to Crohn's    Past Surgical History  Procedure Date  . Total colectomy 2009    with ileostomy at [facility] for refractory disease   . Total shoulder replacement     bilateral  . Total hip arthroplasty     right, due to avascular necrosis from chronic prednisone  . Kidney surgery   . Appendectomy   . Eye surgery   . Esophagogastroduodenoscopy 07/2010    gastritis,  bx neg for H.Pylori  . Small bowel capsule study 08/2010    few tiny nonbleeding erosions/ulcers ?secondary to relafen or Crohn's. Entire small bowel not seen.   . Ileoscopy  06/29/2011    Procedure: ILEOSCOPY THROUGH STOMA;  Surgeon: Dorothyann Peng, MD;  Location[facility];  Service: Endoscopy;  Laterality: N/A;  9:45    Allergies  Allergen Reactions  . Aspirin     REACTION: unknown reaction  . Codeine     REACTION: unknown reaction  . Ibuprofen     REACTION: unknown reaction  . Penicillins     REACTION: unknown reaction  . Tramadol Hcl     REACTION: unknown reaction  . Vicodin (Hydrocodone-Acetaminophen) Nausea Only    Current Outpatient Prescriptions  Medication Sig Dispense Refill  . acetaminophen (TYLENOL) 500 MG tablet Take 1,000 mg by mouth every 6 (six) hours as needed. For pain       . acetaminophen-codeine (TYLENOL #3) 300-30 MG per tablet Take 1 tablet by mouth every 4 (four) hours as needed.        . benztropine (COGENTIN) 1 MG tablet Take 1 mg by mouth 2 (two) times daily.        . calcium-vitamin D (OSCAL WITH D) 500-200 MG-UNIT per tablet Take 1 tablet by mouth 2 (two) times daily.       . cetirizine (ZYRTEC) 10 MG tablet Take 10 mg by mouth daily.        Marland Kitchen  darifenacin (ENABLEX) 7.5 MG 24 hr tablet Take 7.5 mg by mouth daily.        . divalproex (DEPAKOTE) 500 MG 24 hr tablet Take 1,500 mg by mouth daily.       . folic acid (FOLVITE) 1 MG tablet Take 1 mg by mouth daily.        . furosemide (LASIX) 40 MG tablet Take 40 mg by mouth daily.        Marland Kitchen gabapentin (NEURONTIN) 300 MG capsule Take 900 mg by mouth daily.       . mesalamine (PENTASA) 250 MG CR capsule Take 250 mg by mouth 4 (four) times daily. Taking 4 qid      . methotrexate (RHEUMATREX) 2.5 MG tablet Take 15 mg by mouth once a week. Caution:Chemotherapy. Protect from light.      . metoprolol succinate (TOPROL-XL) 25 MG 24 hr tablet Take 12.5 mg by mouth daily at 2 PM daily at 2 PM.        NEXIUM Take 40 mg by mouth 2 (two) times daily.       . pravastatin (PRAVACHOL) 20 MG tablet Take 20 mg by mouth daily.        . risperiDONE (RISPERDAL) 2 MG tablet Take 7 mg by mouth daily.        Marland Kitchen rOPINIRole (REQUIP) 2 MG tablet Take 2 mg by mouth at bedtime.        . Tamsulosin HCl (FLOMAX) 0.4 MG CAPS Take 0.4 mg by mouth daily.            Review of Systems     Objective:   Physical Exam  Vitals reviewed. Constitutional: He is oriented to person, place, and time. He appears well-developed and well-nourished.  HENT:  Head: Normocephalic and atraumatic.  Eyes: No scleral icterus.  Neck: Normal range of motion. Neck supple.  Cardiovascular: Normal rate, regular rhythm and normal heart sounds.   Pulmonary/Chest: Effort normal and breath sounds normal. No respiratory distress.  Abdominal: Soft. Bowel sounds are normal. He exhibits no distension. There is tenderness (MILD TO MOD TTP AROUND OSTOMY SITE. LIMITED EXAM BECAUSE PT DES NOT HAVE A REPLACEMENT BAG). There is no rebound and no guarding.  Musculoskeletal:       LROM  Neurological: He is alert and oriented to person, place, and time.       NO NEW FOCAL DEFICITS   Psychiatric: He has a normal mood and affect.          Assessment & Plan:

## 2011-10-14 NOTE — Patient Instructions (Signed)
TAKE DIFLUCAN 1 DAILY FOR 7 DAYS to treat yeast around your ostomy site. FOLLOW UP WITH OSTOMY NURSE AND GENERAL SURGERY [facility]. FOLLOW UP IN 2 MOS.

## 2011-10-14 NOTE — ED Provider Notes (Signed)
History  Scribed for [name] Cable, MD, the patient was seen in room [facility]. This chart was scribed by [name]. The patient's care started at 4:59PM.    CSN: [id number]  Arrival date & time 10/14/11  1538   First MD Initiated Contact with Patient 10/14/11 1659      Chief Complaint  Patient presents with  . Knee Pain     The history is provided by the patient.   [name] is a 46 y.o. male who presents to the Emergency Department complaining of constant left knee pain after experiencing two falls last night.  Pt states that he has fallen several times over the past month due to left knee pain.  He denies fever, vomiting, diarrhea, cough, or abdominal pain.  He saw Dr. Oneida Alar today for colostomy bag examination.  He is also experiencing a headache but it is mild.    H/o blood clots, but due to PICC line, not currently on anticoagulants  Walking makes pain worse Sitting improves pain  Past Medical History  Diagnosis Date  . Crohn's colitis     s/p total colectomy with ileostomy in 2009  . Nystagmus   . Bipolar disorder   . Asthma   . GERD (gastroesophageal reflux disease)   . Hyperlipidemia   . Insomnia   . Enterococcus UTI 2009  . 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 DVT (deep venous thrombosis) 2009    right upper extremity due to PICC  . S/P endoscopy Sept 2012    mild gastritis, small hiatal hernia, no ulcers  . Crohn's disease of small intestine Sept 2012    ileoscopy: multiple ulcers likely secondary to Crohn's    Past Surgical History  Procedure Date  . Total colectomy 2009    with ileostomy at [facility] for refractory disease   . Total shoulder replacement     bilateral  . Total hip arthroplasty     right, due to avascular necrosis from chronic prednisone  . Kidney surgery   . Appendectomy   . Eye surgery   . Esophagogastroduodenoscopy 07/2010    gastritis,  bx neg for H.Pylori  . Small bowel  capsule study 08/2010    few tiny nonbleeding erosions/ulcers ?secondary to relafen or Crohn's. Entire small bowel not seen.   . Ileoscopy 06/29/2011    Procedure: ILEOSCOPY THROUGH STOMA;  Surgeon: Dorothyann Peng, MD;  Location[facility];  Service: Endoscopy;  Laterality: N/A;  9:45    History reviewed. No pertinent family history.  History  Substance Use Topics  . Smoking status: Current Everyday Smoker  . Smokeless tobacco: Not on file  . Alcohol Use: No      Review of Systems  Constitutional: Negative for fever.  Respiratory: Negative for cough.   Gastrointestinal: Negative for vomiting, abdominal pain and diarrhea.  Musculoskeletal: Positive for arthralgias (left knee).  All other systems reviewed and are negative.    Allergies  Aspirin; Codeine; Ibuprofen; Penicillins; Tramadol hcl; and Vicodin  Home Medications   Current Outpatient Rx  Name Route Sig Dispense Refill  . ACETAMINOPHEN 500 MG PO TABS Oral Take 1,000 mg by mouth every 6 (six) hours as needed. For pain     . BENZTROPINE MESYLATE 1 MG PO TABS Oral Take 1 mg by mouth 2 (two) times daily.      Marland Kitchen CALCIUM CARBONATE-VITAMIN D 500-200 MG-UNIT PO TABS Oral Take 1 tablet by  mouth 2 (two) times daily.     Marland Kitchen CETIRIZINE HCL 10 MG PO TABS Oral Take 10 mg by mouth daily.      Marland Kitchen DARIFENACIN HYDROBROMIDE ER 7.5 MG PO TB24 Oral Take 7.5 mg by mouth daily.      Marland Kitchen DIVALPROEX SODIUM ER 500 MG PO TB24 Oral Take 1,500 mg by mouth daily.     Marland Kitchen FOLIC ACID 1 MG PO TABS Oral Take 1 mg by mouth daily.      . FUROSEMIDE 40 MG PO TABS Oral Take 40 mg by mouth daily.      Marland Kitchen GABAPENTIN 300 MG PO CAPS Oral Take 900 mg by mouth daily.     . IPRATROPIUM BROMIDE 0.02 % IN SOLN Nebulization Take 500 mcg by nebulization 4 (four) times daily. Shortness of breath    . MESALAMINE 250 MG PO CPCR Oral Take 250 mg by mouth 4 (four) times daily. Taking 4 qid    . METOPROLOL SUCCINATE ER 25 MG PO TB24 Oral Take 12.5 mg by mouth daily at 2 PM  daily at 2 PM.     . OMEPRAZOLE 20 MG PO CPDR Oral Take 20 mg by mouth 2 (two) times daily.     Marland Kitchen ONDANSETRON 4 MG PO TBDP Oral Take 4 mg by mouth every 8 (eight) hours as needed. nausea/vomiting    . PRAVASTATIN SODIUM 20 MG PO TABS Oral Take 20 mg by mouth daily.      Marland Kitchen RISPERIDONE 2 MG PO TABS Oral Take 7 mg by mouth daily.     Marland Kitchen ROPINIROLE HCL 2 MG PO TABS Oral Take 2 mg by mouth at bedtime.      . TAMSULOSIN HCL 0.4 MG PO CAPS Oral Take 0.4 mg by mouth daily.      . TRAZODONE HCL 100 MG PO TABS Oral Take 100 mg by mouth at bedtime.    . METHOTREXATE 2.5 MG PO TABS Oral Take 15 mg by mouth once a week. Caution:Chemotherapy. Protect from light.      BP 111/69  Pulse 92  Temp(Src) 100.8 F (38.2 C) (Oral)  Resp 21  Ht 5' 6"  (1.676 m)  Wt 223 lb (101.152 kg)  BMI 35.99 kg/m2  SpO2 97%  Physical Exam CONSTITUTIONAL: Well developed/well nourished HEAD AND FACE: Normocephalic/atraumatic EYES: EOMI/PERRL ENMT: Mucous membranes moist NECK: supple no meningeal signs SPINE:entire spine nontender CV: S1/S2 noted, no murmurs/rubs/gallops noted LUNGS: Lungs are clear to auscultation bilaterally, no apparent distress ABDOMEN: soft, nontender, no rebound or guarding GU:no cva tenderness, colostomy bag in place, ostomy sight pink, no erythema or tenderness  NEURO: Pt is awake/alert, moves all extremitiesx4 EXTREMITIES:  tenderness to palpation of patella of left knee, small effusion noted, no erythema or warmth, pulses normal SKIN: warm, color normal PSYCH: no abnormalities of mood noted  ED Course  ARTHOCENTESIS Date/Time: 10/14/2011 8:36 PM Performed by: [name] Authorized by: [name] Consent: Verbal consent obtained. Written consent obtained. Risks and benefits: risks, benefits and alternatives were discussed Consent given by: patient Patient understanding: patient states understanding of the procedure being performed Patient identity confirmed: verbally with  patient and provided demographic data Time out: Immediately prior to procedure a "time out" was called to verify the correct patient, procedure, equipment, support staff and site/side marked as required. Indications: joint swelling and pain  Body area: knee Joint: left knee Local anesthesia used: yes Anesthesia: local infiltration Local anesthetic: lidocaine 1% without epinephrine Anesthetic total: 3 ml Patient  sedated: no Preparation: Patient was prepped and draped in the usual sterile fashion. Needle gauge: 18 G Approach: medial Aspirate: serous Aspirate amount: 3 ml Patient tolerance: Patient tolerated the procedure well with no immediate complications.     DIAGNOSTIC STUDIES: Oxygen Saturation is 97% on room air, normal by my interpretation.    COORDINATION OF CARE:  5:19PM Ordered: DG KNEE COMPLETE 4 VIEWS LEFT, oxyCODONE-acetaminophen (PERCOCET) 5-325 MG per tablet 2 tablet ; DG Knee Complete 4 Views Left  6:42PM Ordered: Suture cart ; Body fluid cell count with differential ; Gram stain ; Synovial fluid, crystal ; Body fluid culture; BODY FLUID CULTURE, SYNOVIAL FLUID, CRYSTAL, GRAM STAIN, BODY FLUID CELL COUNT WITH DIFFERENTIAL    8:30PM Ordered: Apply ace wrap  11:12 PM Pt stable D/w dr Luna Glasgow, ?gram negative rods, but other early labs not indicative of septic joint Will start cipro and he will see in office in the AM  Labs Reviewed  CBC - Abnormal; Notable for the following:    RBC 3.67 (*)    HCT 32.9 (*)    MCH 37.9 (*)    MCHC 42.2 (*)    Platelets 99 (*)    All other components within normal limits  DIFFERENTIAL  SEDIMENTATION RATE  BODY FLUID CELL COUNT WITH DIFFERENTIAL  GRAM STAIN  SYNOVIAL FLUID, CRYSTAL  BODY FLUID CULTURE   Dg Knee Complete 4 Views Left  10/14/2011  *RADIOLOGY REPORT*  Clinical Data: Knee pain.  Multiple falls in the past few days.  LEFT KNEE - COMPLETE 4+ VIEW  Comparison:  None.  Findings:  There is no evidence of fracture,  dislocation, or joint effusion.  There is no evidence of arthropathy or other focal bone abnormality.  Soft tissues are unremarkable.  IMPRESSION: Negative.  Original Report Authenticated By: Staci Righter, M.D.        MDM  Nursing notes reviewed and considered in documentation xrays reviewed and considered Previous records reviewed and considered All labs/vitals reviewed and considered   I personally performed the services described in this documentation, which was scribed in my presence. The recorded information has been reviewed and considered.          [name] Cable, MD 10/14/11 [phone]

## 2011-10-14 NOTE — Telephone Encounter (Signed)
Pt is scheduled at the [place] on 01/15 @ 9:00- Spoke with Lurline Del at [facility], and informed her of this appt. Records faxed to [facility]

## 2011-10-14 NOTE — Assessment & Plan Note (Addendum)
HAS OSTOMY PAIN AND RASH.  ADD DIFLUCAN 1 PO DAILY. DISCUSSED DRUG INTERACTION WITH [facility]. ARRANGE FOLLOW UP WITH [facility] FOR OSTOMY PAIN/MANAGEMENT. OSTOMY CARE CONSULT. OPV IN 2 MOS.

## 2011-10-15 LAB — BODY FLUID CELL COUNT WITH DIFFERENTIAL
Lymphs, Fluid: 21 %
Monocyte-Macrophage-Serous Fluid: 73 % (ref 50–90)
Neutrophil Count, Fluid: 6 % (ref 0–25)

## 2011-10-18 LAB — BODY FLUID CULTURE: Culture: NO GROWTH

## 2011-10-20 ENCOUNTER — Other Ambulatory Visit: Payer: Self-pay | Admitting: Gastroenterology

## 2011-10-20 DIAGNOSIS — R945 Abnormal results of liver function studies: Secondary | ICD-10-CM

## 2011-10-20 DIAGNOSIS — K5090 Crohn's disease, unspecified, without complications: Secondary | ICD-10-CM

## 2011-10-21 ENCOUNTER — Telehealth: Payer: Self-pay

## 2011-10-21 LAB — GRAM STAIN

## 2011-10-21 NOTE — Telephone Encounter (Signed)
Called pt. He had not had some labs done that were due in August 2012. Per Neil Crouch, PA pt needs to do those and also HFP. Pt said he just had labs done with home health from [place]. I called and spoke to [facility] at the health department. She said that the labs could not be added. She told me to fax the orders and she would see that his nurse goes out to draw. I faxed them yesterday afternoon to them.

## 2011-10-27 ENCOUNTER — Telehealth: Payer: Self-pay | Admitting: Gastroenterology

## 2011-10-27 NOTE — Telephone Encounter (Signed)
Pt is scheduled to see Dr Morton Stall @ [facility] regarding his ostomy site on 01/31/@ 3:45- I spoke with Larene Beach at [facility] and advised her of this appt

## 2011-10-29 ENCOUNTER — Other Ambulatory Visit (HOSPITAL_COMMUNITY): Payer: Self-pay | Admitting: Orthopaedic Surgery

## 2011-10-29 DIAGNOSIS — M25569 Pain in unspecified knee: Secondary | ICD-10-CM

## 2011-11-01 ENCOUNTER — Ambulatory Visit (HOSPITAL_COMMUNITY)
Admission: RE | Admit: 2011-11-01 | Discharge: 2011-11-01 | Disposition: A | Discharge status: Home / Self Care | Payer: Medicaid Other | Source: Ambulatory Visit | Attending: Orthopaedic Surgery | Admitting: Orthopaedic Surgery

## 2011-11-01 DIAGNOSIS — R937 Abnormal findings on diagnostic imaging of other parts of musculoskeletal system: Secondary | ICD-10-CM | POA: Insufficient documentation

## 2011-11-01 DIAGNOSIS — M23302 Other meniscus derangements, unspecified lateral meniscus, unspecified knee: Secondary | ICD-10-CM | POA: Insufficient documentation

## 2011-11-01 DIAGNOSIS — M25569 Pain in unspecified knee: Secondary | ICD-10-CM | POA: Insufficient documentation

## 2011-11-01 DIAGNOSIS — M25469 Effusion, unspecified knee: Secondary | ICD-10-CM | POA: Insufficient documentation

## 2011-11-01 LAB — HEPATIC FUNCTION PANEL
ALT: 26 U/L (ref 10–40)
AST: 29 U/L
Albumin: 4.6
Hep C Virus Ab: 0.1
Total Bilirubin: 0.4 mg/dL

## 2011-11-24 ENCOUNTER — Emergency Department (HOSPITAL_COMMUNITY): Payer: Medicaid Other

## 2011-11-24 ENCOUNTER — Encounter (HOSPITAL_COMMUNITY): Payer: Self-pay | Admitting: Emergency Medicine

## 2011-11-24 ENCOUNTER — Other Ambulatory Visit: Payer: Self-pay

## 2011-11-24 ENCOUNTER — Emergency Department (HOSPITAL_COMMUNITY)
Admission: EM | Admit: 2011-11-24 | Discharge: 2011-11-24 | Disposition: A | Discharge status: Home / Self Care | Payer: Medicaid Other | Attending: Emergency Medicine | Admitting: Emergency Medicine

## 2011-11-24 DIAGNOSIS — R55 Syncope and collapse: Secondary | ICD-10-CM | POA: Insufficient documentation

## 2011-11-24 DIAGNOSIS — K5090 Crohn's disease, unspecified, without complications: Secondary | ICD-10-CM | POA: Insufficient documentation

## 2011-11-24 DIAGNOSIS — K529 Noninfective gastroenteritis and colitis, unspecified: Secondary | ICD-10-CM

## 2011-11-24 DIAGNOSIS — Z79899 Other long term (current) drug therapy: Secondary | ICD-10-CM | POA: Insufficient documentation

## 2011-11-24 DIAGNOSIS — F209 Schizophrenia, unspecified: Secondary | ICD-10-CM | POA: Insufficient documentation

## 2011-11-24 DIAGNOSIS — J45909 Unspecified asthma, uncomplicated: Secondary | ICD-10-CM | POA: Insufficient documentation

## 2011-11-24 DIAGNOSIS — F319 Bipolar disorder, unspecified: Secondary | ICD-10-CM | POA: Insufficient documentation

## 2011-11-24 DIAGNOSIS — M8708 Idiopathic aseptic necrosis of bone, other site: Secondary | ICD-10-CM | POA: Insufficient documentation

## 2011-11-24 DIAGNOSIS — Z933 Colostomy status: Secondary | ICD-10-CM | POA: Insufficient documentation

## 2011-11-24 DIAGNOSIS — F17200 Nicotine dependence, unspecified, uncomplicated: Secondary | ICD-10-CM | POA: Insufficient documentation

## 2011-11-24 DIAGNOSIS — R079 Chest pain, unspecified: Secondary | ICD-10-CM | POA: Insufficient documentation

## 2011-11-24 DIAGNOSIS — Z86718 Personal history of other venous thrombosis and embolism: Secondary | ICD-10-CM | POA: Insufficient documentation

## 2011-11-24 DIAGNOSIS — K219 Gastro-esophageal reflux disease without esophagitis: Secondary | ICD-10-CM | POA: Insufficient documentation

## 2011-11-24 DIAGNOSIS — R109 Unspecified abdominal pain: Secondary | ICD-10-CM | POA: Insufficient documentation

## 2011-11-24 DIAGNOSIS — K5289 Other specified noninfective gastroenteritis and colitis: Secondary | ICD-10-CM | POA: Insufficient documentation

## 2011-11-24 DIAGNOSIS — E860 Dehydration: Secondary | ICD-10-CM | POA: Insufficient documentation

## 2011-11-24 DIAGNOSIS — E785 Hyperlipidemia, unspecified: Secondary | ICD-10-CM | POA: Insufficient documentation

## 2011-11-24 HISTORY — DX: Brief psychotic disorder: F23

## 2011-11-24 LAB — BASIC METABOLIC PANEL
CO2: 28 mEq/L (ref 19–32)
Chloride: 100 mEq/L (ref 96–112)
Creatinine, Ser: 1.06 mg/dL (ref 0.50–1.35)
Glucose, Bld: 100 mg/dL — ABNORMAL HIGH (ref 70–99)

## 2011-11-24 LAB — POCT I-STAT TROPONIN I: Troponin i, poc: 0 ng/mL (ref 0.00–0.08)

## 2011-11-24 LAB — CBC
HCT: 45.2 % (ref 39.0–52.0)
Hemoglobin: 15.3 g/dL (ref 13.0–17.0)
MCV: 87.6 fL (ref 78.0–100.0)
RBC: 5.16 MIL/uL (ref 4.22–5.81)
RDW: 13.8 % (ref 11.5–15.5)
WBC: 7.7 10*3/uL (ref 4.0–10.5)

## 2011-11-24 LAB — DIFFERENTIAL
Basophils Absolute: 0 10*3/uL (ref 0.0–0.1)
Eosinophils Relative: 2 % (ref 0–5)
Lymphocytes Relative: 27 % (ref 12–46)
Lymphs Abs: 2.1 10*3/uL (ref 0.7–4.0)
Monocytes Absolute: 0.7 10*3/uL (ref 0.1–1.0)
Neutro Abs: 4.8 10*3/uL (ref 1.7–7.7)

## 2011-11-24 MED ORDER — SODIUM CHLORIDE 0.9 % IV BOLUS (SEPSIS)
1000.0000 mL | Freq: Once | INTRAVENOUS | Status: AC
  Administered 2011-11-24: 1000 mL via INTRAVENOUS

## 2011-11-24 NOTE — ED Provider Notes (Signed)
This chart was scribed for Maudry Diego, MD by Zella Ball. The patient was seen in room [facility] at 4:10 PM.  CSN: [id number]  Arrival date & time 11/24/11  1550   First MD Initiated Contact with Patient 11/24/11 1608      Chief Complaint  Patient presents with  . Loss of Consciousness    (Consider location/radiation/quality/duration/timing/severity/associated sxs/prior treatment) Patient is a 46 y.o. male presenting with syncope and diarrhea. The history is provided by the patient.  Loss of Consciousness This is a new problem. The current episode started 1 to 2 hours ago. The problem occurs rarely. The problem has been gradually improving. Associated symptoms include chest pain and abdominal pain. Pertinent negatives include no headaches. The symptoms are relieved by nothing. He has tried nothing for the symptoms.  Diarrhea The primary symptoms include fever, abdominal pain and diarrhea. Primary symptoms do not include fatigue or rash. The illness began 3 to 5 days ago. The onset was sudden. The problem has been gradually worsening.  The illness is also significant for chills. The illness does not include back pain.   [name] is a 46 y.o. male who presents to the Emergency Department complaining of acute onset loss of consciousness. Pt reports having associated abdominal pain, fever, chills, diarrhea, cough, and achy feeling in chest for 3 days. Pt was found passed out in nursing home.  Past Medical History  Diagnosis Date  . Crohn's colitis     s/p total colectomy with ileostomy in 2009  . Nystagmus   . Bipolar disorder   . Asthma   . GERD (gastroesophageal reflux disease)   . Hyperlipidemia   . Insomnia   . Enterococcus UTI 2009  . 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 DVT (deep venous thrombosis) 2009    right upper extremity due to PICC  . S/P endoscopy Sept 2012    mild gastritis, small hiatal hernia, no ulcers    . Crohn's disease of small intestine Sept 2012    ileoscopy: multiple ulcers likely secondary to Crohn's  . Schizophrenia, acute     Past Surgical History  Procedure Date  . Total colectomy 2009    with ileostomy at [facility] for refractory disease   . Total shoulder replacement     bilateral  . Total hip arthroplasty     right, due to avascular necrosis from chronic prednisone  . Kidney surgery   . Appendectomy   . Eye surgery   . Esophagogastroduodenoscopy 07/2010    gastritis,  bx neg for H.Pylori  . Small bowel capsule study 08/2010    few tiny nonbleeding erosions/ulcers ?secondary to relafen or Crohn's. Entire small bowel not seen.   . Ileoscopy 06/29/2011    Procedure: ILEOSCOPY THROUGH STOMA;  Surgeon: Dorothyann Peng, MD;  [facility];  Service: Endoscopy;  Laterality: N/A;  9:45    History reviewed. No pertinent family history.  History  Substance Use Topics  . Smoking status: Current Everyday Smoker  . Smokeless tobacco: Not on file  . Alcohol Use: No      Review of Systems  Constitutional: Positive for fever and chills. Negative for fatigue.  HENT: Negative for congestion, sinus pressure and ear discharge.   Eyes: Negative for discharge.  Respiratory: Negative for cough.   Cardiovascular: Positive for chest pain and syncope.  Gastrointestinal: Positive for abdominal pain and diarrhea.  Genitourinary: Negative for frequency and  hematuria.  Musculoskeletal: Negative for back pain.  Skin: Negative for rash.  Neurological: Negative for seizures and headaches.  Hematological: Negative.   Psychiatric/Behavioral: Negative for hallucinations.    Allergies  Aspirin; Codeine; Ibuprofen; Penicillins; Tramadol hcl; and Vicodin  Home Medications   Current Outpatient Rx  Name Route Sig Dispense Refill  . ACETAMINOPHEN 500 MG PO TABS Oral Take 1,000 mg by mouth every 6 (six) hours as needed. For pain     . BENZTROPINE MESYLATE 1 MG PO TABS Oral Take 1  mg by mouth 2 (two) times daily.      Marland Kitchen CALCIUM CARBONATE-VITAMIN D 500-200 MG-UNIT PO TABS Oral Take 1 tablet by mouth 2 (two) times daily.     Marland Kitchen CETIRIZINE HCL 10 MG PO TABS Oral Take 10 mg by mouth daily.      Marland Kitchen DARIFENACIN HYDROBROMIDE ER 7.5 MG PO TB24 Oral Take 7.5 mg by mouth daily.      Marland Kitchen DIVALPROEX SODIUM ER 500 MG PO TB24 Oral Take 1,500 mg by mouth daily.     Marland Kitchen FOLIC ACID 1 MG PO TABS Oral Take 1 mg by mouth daily.      . FUROSEMIDE 40 MG PO TABS Oral Take 40 mg by mouth daily.      Marland Kitchen GABAPENTIN 300 MG PO CAPS Oral Take 900 mg by mouth daily.     . IPRATROPIUM BROMIDE 0.02 % IN SOLN Nebulization Take 500 mcg by nebulization 4 (four) times daily. Shortness of breath    . MESALAMINE 250 MG PO CPCR Oral Take 250 mg by mouth 4 (four) times daily. Taking 4 qid    . METHOTREXATE 2.5 MG PO TABS Oral Take 15 mg by mouth once a week. Caution:Chemotherapy. Protect from light.    Marland Kitchen METOPROLOL SUCCINATE ER 25 MG PO TB24 Oral Take 12.5 mg by mouth daily at 2 PM daily at 2 PM.     . OMEPRAZOLE 20 MG PO CPDR Oral Take 20 mg by mouth 2 (two) times daily.     Marland Kitchen ONDANSETRON 4 MG PO TBDP Oral Take 4 mg by mouth every 8 (eight) hours as needed. nausea/vomiting    . PRAVASTATIN SODIUM 20 MG PO TABS Oral Take 20 mg by mouth daily.      Marland Kitchen RISPERIDONE 2 MG PO TABS Oral Take 7 mg by mouth daily.     Marland Kitchen ROPINIROLE HCL 2 MG PO TABS Oral Take 2 mg by mouth at bedtime.      . TAMSULOSIN HCL 0.4 MG PO CAPS Oral Take 0.4 mg by mouth daily.      . TRAZODONE HCL 100 MG PO TABS Oral Take 100 mg by mouth at bedtime.      BP 133/61  Pulse 91  Temp 98.2 F (36.8 C)  Resp 24  Ht 5' 2"  (1.575 m)  Wt 220 lb (99.791 kg)  BMI 40.24 kg/m2  SpO2 98%  Physical Exam  Constitutional: He is oriented to person, place, and time. He appears well-developed.       Pt has a colostomy bag  HENT:  Head: Normocephalic and atraumatic.  Mouth/Throat: Mucous membranes are dry.  Eyes: Conjunctivae and EOM are normal. No scleral  icterus.  Neck: Neck supple. No thyromegaly present.  Cardiovascular: Normal rate and regular rhythm.  Exam reveals no gallop and no friction rub.   No murmur heard. Pulmonary/Chest: No stridor. He has no wheezes. He has no rales. He exhibits no tenderness.  Abdominal: He exhibits no distension.  There is tenderness. There is no rebound.  Musculoskeletal: Normal range of motion. He exhibits no edema.  Lymphadenopathy:    He has no cervical adenopathy.  Neurological: He is oriented to person, place, and time. Coordination normal.  Skin: No rash noted. No erythema.  Psychiatric: He has a normal mood and affect. His behavior is normal.    ED Course  Procedures (including critical care time) 8:04 PM Recheck: Pt notified of lab results and scans.  DIAGNOSTIC STUDIES: Oxygen Saturation is 98% on room air, normal by my interpretation.    COORDINATION OF CARE: Medications - No data to display     Labs Reviewed  CBC  DIFFERENTIAL  BASIC METABOLIC PANEL   No results found.   No diagnosis found.    MDM  Gastroenteritis.  dehydration The chart was scribed for me under my direct supervision.  I personally performed the history, physical, and medical decision making and all procedures in the evaluation of this patient.Maudry Diego, MD 11/24/11 2102

## 2011-11-24 NOTE — ED Notes (Signed)
Pt found sitting in lawn chair at assisted living facility "passed out". Per ems pt was "in and out" until arrival to ed. Pt also c/o cp today. nad noted.

## 2011-11-24 NOTE — ED Notes (Signed)
Patient is waiting for transport home.

## 2011-11-24 NOTE — Progress Notes (Signed)
Quick Note:  LFTs now normal. Hep B and C were negative. Initial autoimmune w/u good. Plan as outlined by [facility] last OV. ______

## 2011-11-24 NOTE — Discharge Instructions (Signed)
Follow up with your md for recheck this week.  Drink plenty of fluids

## 2011-11-24 NOTE — Progress Notes (Signed)
Quick Note:  Called to inform pt of results. Someone at the home answered and said they had to call EMS for him about an hour ago. Said he passed out and they think his BP dropped. I told him I will call back another day. ______

## 2011-11-25 NOTE — Progress Notes (Signed)
Quick Note:  Called and informed pt. He said he is feeling better today. Has appt on 12/08/2011 @ 2:00 Pm.   ______

## 2011-11-28 ENCOUNTER — Emergency Department (HOSPITAL_COMMUNITY)
Admission: EM | Admit: 2011-11-28 | Discharge: 2011-11-29 | Disposition: A | Discharge status: Home / Self Care | Payer: Medicaid Other | Attending: Emergency Medicine | Admitting: Emergency Medicine

## 2011-11-28 ENCOUNTER — Encounter (HOSPITAL_COMMUNITY): Payer: Self-pay

## 2011-11-28 ENCOUNTER — Emergency Department (HOSPITAL_COMMUNITY): Payer: Medicaid Other

## 2011-11-28 DIAGNOSIS — F319 Bipolar disorder, unspecified: Secondary | ICD-10-CM | POA: Insufficient documentation

## 2011-11-28 DIAGNOSIS — R209 Unspecified disturbances of skin sensation: Secondary | ICD-10-CM | POA: Insufficient documentation

## 2011-11-28 DIAGNOSIS — Z79899 Other long term (current) drug therapy: Secondary | ICD-10-CM | POA: Insufficient documentation

## 2011-11-28 DIAGNOSIS — R269 Unspecified abnormalities of gait and mobility: Secondary | ICD-10-CM | POA: Insufficient documentation

## 2011-11-28 DIAGNOSIS — E785 Hyperlipidemia, unspecified: Secondary | ICD-10-CM | POA: Insufficient documentation

## 2011-11-28 DIAGNOSIS — M25569 Pain in unspecified knee: Secondary | ICD-10-CM

## 2011-11-28 DIAGNOSIS — K219 Gastro-esophageal reflux disease without esophagitis: Secondary | ICD-10-CM | POA: Insufficient documentation

## 2011-11-28 DIAGNOSIS — R296 Repeated falls: Secondary | ICD-10-CM | POA: Insufficient documentation

## 2011-11-28 DIAGNOSIS — J45909 Unspecified asthma, uncomplicated: Secondary | ICD-10-CM | POA: Insufficient documentation

## 2011-11-28 DIAGNOSIS — Z86718 Personal history of other venous thrombosis and embolism: Secondary | ICD-10-CM | POA: Insufficient documentation

## 2011-11-28 DIAGNOSIS — Z8659 Personal history of other mental and behavioral disorders: Secondary | ICD-10-CM | POA: Insufficient documentation

## 2011-11-28 MED ORDER — OXYCODONE-ACETAMINOPHEN 5-325 MG PO TABS
2.0000 | ORAL_TABLET | Freq: Once | ORAL | Status: AC
  Administered 2011-11-28: 2 via ORAL
  Filled 2011-11-28: qty 2

## 2011-11-28 NOTE — ED Notes (Signed)
Went to the bathroom and legs got numb and fell when in the kitchen per pt.

## 2011-11-28 NOTE — ED Provider Notes (Signed)
History     CSN: [id number]  Arrival date & time 11/28/11  2124   First MD Initiated Contact with Patient 11/28/11 2249      Chief Complaint  Patient presents with  . Numbness    (Consider location/radiation/quality/duration/timing/severity/associated sxs/prior treatment) HPI Comments: Patient has chronic left knee pain, states that while he was walking to the bathroom prior to arrival his left knee locked up and he fell to the floor. He landed mostly on his left knee but then had a slight bump to the back of his head. This was not associated with a loss of consciousness. The pain is constant, mild to moderate, worse with bending the knee. There is no associated swelling, redness, lacerations or contusions. Does wear a chronic brace on the left knee do to its instability and pain.  Patient is a 46 y.o. male presenting with fall. The history is provided by the patient.  Fall The accident occurred 1 to 2 hours ago. The fall occurred while walking. He landed on a hard floor. There was no blood loss. The point of impact was the left knee. The pain is present in the left knee and head. The pain is mild. He was ambulatory at the scene. There was no entrapment after the fall. Pertinent negatives include no numbness, no nausea, no vomiting and no loss of consciousness. The symptoms are aggravated by flexion.   Fell in bedroom and landed on linoleum. Pain to left knee hurts to bend it. Mild head injury no loc Past Medical History  Diagnosis Date  . Crohn's colitis     s/p total colectomy with ileostomy in 2009  . Nystagmus   . Bipolar disorder   . Asthma   . GERD (gastroesophageal reflux disease)   . Hyperlipidemia   . Insomnia   . Enterococcus UTI 2009  . 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 DVT (deep venous thrombosis) 2009    right upper extremity due to PICC  . S/P endoscopy Sept 2012    mild gastritis, small hiatal hernia, no ulcers  .  Crohn's disease of small intestine Sept 2012    ileoscopy: multiple ulcers likely secondary to Crohn's  . Schizophrenia, acute     Past Surgical History  Procedure Date  . Total colectomy 2009    with ileostomy at [facility] for refractory disease   . Total shoulder replacement     bilateral  . Total hip arthroplasty     right, due to avascular necrosis from chronic prednisone  . Kidney surgery   . Appendectomy   . Eye surgery   . Esophagogastroduodenoscopy 07/2010    gastritis,  bx neg for H.Pylori  . Small bowel capsule study 08/2010    few tiny nonbleeding erosions/ulcers ?secondary to relafen or Crohn's. Entire small bowel not seen.   . Ileoscopy 06/29/2011    Procedure: ILEOSCOPY THROUGH STOMA;  Surgeon: Dorothyann Peng, MD;  Location: [facility];  Service: Endoscopy;  Laterality: N/A;  9:45    History reviewed. No pertinent family history.  History  Substance Use Topics  . Smoking status: Current Everyday Smoker  . Smokeless tobacco: Not on file  . Alcohol Use: No      Review of Systems  Gastrointestinal: Negative for nausea and vomiting.  Musculoskeletal: Positive for gait problem. Negative for joint swelling.  Neurological: Negative for loss of consciousness, weakness and numbness.    Allergies  Aspirin; Codeine;  Ibuprofen; Penicillins; Tramadol hcl; and Vicodin  Home Medications   Current Outpatient Rx  Name Route Sig Dispense Refill  . BENZTROPINE MESYLATE 0.5 MG PO TABS Oral Take 0.5 mg by mouth daily.    Marland Kitchen CALCIUM CARBONATE-VITAMIN D 500-200 MG-UNIT PO TABS Oral Take 1 tablet by mouth 2 (two) times daily.     Marland Kitchen CETIRIZINE HCL 10 MG PO TABS Oral Take 10 mg by mouth daily.      Marland Kitchen DARIFENACIN HYDROBROMIDE ER 7.5 MG PO TB24 Oral Take 7.5 mg by mouth daily.      Marland Kitchen DIVALPROEX SODIUM ER 250 MG PO TB24 Oral Take 250 mg by mouth every evening.    Marland Kitchen DIVALPROEX SODIUM ER 500 MG PO TB24 Oral Take 500 mg by mouth every evening.     Marland Kitchen FOLIC ACID 1 MG PO TABS Oral  Take 1 mg by mouth daily.      . FUROSEMIDE 40 MG PO TABS Oral Take 40 mg by mouth daily.      Marland Kitchen GABAPENTIN 300 MG PO CAPS Oral Take 300 mg by mouth daily.     Marland Kitchen MESALAMINE 250 MG PO CPCR Oral Take 1,000 mg by mouth 3 (three) times daily. Taking 4 tablets three times a day    . METHOTREXATE 2.5 MG PO TABS Oral Take 15 mg by mouth once a week. Caution:Chemotherapy. Protect from light.patient take on Sunday    . METOPROLOL SUCCINATE ER 25 MG PO TB24 Oral Take 12.5 mg by mouth every evening.     . OLOPATADINE HCL 0.2 % OP SOLN Ophthalmic Apply 1 drop to eye 2 (two) times daily. Patient use in left eye    . OMEPRAZOLE 20 MG PO CPDR Oral Take 20 mg by mouth 2 (two) times daily.     Marland Kitchen ONDANSETRON 4 MG PO TBDP Oral Take 4 mg by mouth every 8 (eight) hours as needed. nausea/vomiting    . PRAVASTATIN SODIUM 20 MG PO TABS Oral Take 20 mg by mouth daily.      Marland Kitchen ROPINIROLE HCL 2 MG PO TABS Oral Take 2 mg by mouth at bedtime.      . TAMSULOSIN HCL 0.4 MG PO CAPS Oral Take 0.4 mg by mouth daily.      . TRAZODONE HCL 50 MG PO TABS Oral Take 50 mg by mouth at bedtime.    . ACETAMINOPHEN 500 MG PO TABS Oral Take 1,000 mg by mouth every 6 (six) hours as needed. For pain     . IPRATROPIUM BROMIDE 0.02 % IN SOLN Nebulization Take 500 mcg by nebulization 4 (four) times daily. Shortness of breath      BP 120/71  Pulse 85  Temp(Src) 97.8 F (36.6 C) (Oral)  Resp 18  Ht 5' 6"  (1.676 m)  Wt 217 lb (98.431 kg)  BMI 35.02 kg/m2  SpO2 95%  Physical Exam  Nursing note and vitals reviewed. Constitutional: He is oriented to person, place, and time. He appears well-developed and well-nourished. No distress.  HENT:  Head: Normocephalic and atraumatic.  Eyes: Conjunctivae are normal. Pupils are equal, round, and reactive to light. No scleral icterus.  Neck: Normal range of motion. Neck supple.  Cardiovascular: Normal rate, regular rhythm, normal heart sounds and intact distal pulses.        Normal pulses of the  left foot his dorsalis pedis and posterior tibial arteries  Pulmonary/Chest: Effort normal and breath sounds normal.  Abdominal: Soft. There is no tenderness.  Musculoskeletal: He exhibits tenderness (  Tender to palpation over the left knee, diffusely. There is decreased range of motion secondary to pain. There is no obvious effusion, laceration, contusion or hematoma.). He exhibits no edema.  Neurological: He is alert and oriented to person, place, and time.  Skin: Skin is warm and dry. No rash noted. He is not diaphoretic.    ED Course  Procedures (including critical care time)  Labs Reviewed - No data to display Dg Knee Complete 4 Views Left  11/29/2011  *RADIOLOGY REPORT*  Clinical Data: Status post fall; left knee pain.  LEFT KNEE - COMPLETE 4+ VIEW  Comparison: Left knee radiographs performed 10/14/2011, and left knee MRI performed 11/01/2011  Findings: There is no evidence of acute fracture or dislocation. The large osteochondral lesion at the posterior lateral femoral condyle is better defined than on prior radiographs.  The joint spaces are preserved.  No significant degenerative change is seen; the patellofemoral joint is grossly unremarkable in appearance.  There is a stable small sclerotic focus within the medial femoral condyle.  Trace joint fluid remains within normal limits.  The visualized soft tissues are normal in appearance.  IMPRESSION:  1.  No evidence of acute fracture or dislocation. 2.  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  Original Report Authenticated By: Santa Lighter, M.D.     1. Knee pain       MDM  Exam is benign given history of chronic knee pain. Imaging is reviewed by myself and the radiologist showing no signs of acute fracture or dislocation. We'll discharge the patient to followup with his orthopedic doctor given the appearance of the what looks  to be spreading bone lesion. Has been seen on prior imaging.  Percocet given for pain.  I personally performed the services described in this documentation, which was scribed in my presence. The recorded information has been reviewed and considered.          Johnna Acosta, MD 11/29/11 [phone]

## 2011-11-29 NOTE — Discharge Instructions (Signed)
Your x-rays show no signs of fracture or dislocation but he did have a lesion on your knee x-ray that needs to be followed up by an orthopedic doctor. Please call the orthopedic doctor of your choice, or see the phone number above. You should be seen within the next week for a followup exam of your knee.

## 2011-12-03 ENCOUNTER — Other Ambulatory Visit: Payer: Self-pay | Admitting: Gastroenterology

## 2011-12-04 LAB — HEPATIC FUNCTION PANEL
ALT: 22 U/L (ref 0–53)
Alkaline Phosphatase: 79 U/L (ref 39–117)
Bilirubin, Direct: 0.1 mg/dL (ref 0.0–0.3)
Indirect Bilirubin: 0.5 mg/dL (ref 0.0–0.9)

## 2011-12-06 NOTE — Progress Notes (Signed)
Quick Note:  LFTs normal. Forward to [facility] for review. Seen in 10/2011. ______

## 2011-12-07 NOTE — Progress Notes (Signed)
Quick Note:  Pt is aware of normal LFT's. ______

## 2011-12-07 NOTE — Progress Notes (Signed)
Quick Note:  Forwarding to Dr. Oneida Alar to review. ______

## 2011-12-07 NOTE — Progress Notes (Signed)
REVIEWED.  

## 2011-12-08 ENCOUNTER — Ambulatory Visit (INDEPENDENT_AMBULATORY_CARE_PROVIDER_SITE_OTHER): Payer: Medicaid Other | Admitting: Gastroenterology

## 2011-12-08 ENCOUNTER — Encounter: Payer: Self-pay | Admitting: Gastroenterology

## 2011-12-08 DIAGNOSIS — K5000 Crohn's disease of small intestine without complications: Secondary | ICD-10-CM

## 2011-12-08 NOTE — Patient Instructions (Signed)
CONTINUE PENTASA. FOLLOW UP IN 4 MOS. WILL CHECK LIVER ENZYMES AND BLOOD COUNT ONCE A YEAR UNLESS YOU ARE HAVING PROBLEMS.

## 2011-12-08 NOTE — Progress Notes (Signed)
Faxed to PCP

## 2011-12-08 NOTE — Progress Notes (Signed)
Subjective:    Patient ID: [name], male    DOB: [birth year], 46 y.o.   MRN: [id number]  PCP: PENDING  HPI  NOV 2012: BURIED GIRLFRIEND & MOVED IN [facility] & STARTED BLAND DIET.  LAST VISIT JAN 2013: Having pain around ostomy site (inside ostomy border) for past 3-4 days. Seeing a little blood: only a couple of time. First time was when he wiped the stomach and this AM. CHANGING BAG EVERY 3-4 DAYS. STOOL MOSTLY SOFT AND BROWN. DID HAVE RUNNY STOOL LAST WEEK. No fever or chills. Had a migraine for 12 days. Taking Pentasa 250 mg 4 pills tid. Also CHANGED TO [facility] due to Medicaid would make him pay $400/mo. Itching and a rash around OSTMOY site. BLOOD DRAW PENDING IN JAN 2013.  Also having problems with falling. Twice yesterday, twice the other day and four times as well. Knee just gives out. Waiting for NP from Dr. [facility] to evaluate.   DATING A NEW GIRLFRIEND FOR PAST MONTH: KATINA  SINCE LAST VISIT WOULDHAVE MILD STOMACH CRAMPS-EVERY DAY, 2-3X/DAY, Sx LASTED 10-15 MINUTES. 2 WEEKS AGO: NAUSEA, VOMITING AND SEVER PAIN, Sx LASTED 1 HOUR. ENDED UP IN THE ED. HAD A STOMACH XRAY. Changing bag q2-3 days. No blood in it, no diarrhea. Saw ostomy nurse. Had a rash-got better with DIF. Recommended diet modification. LAST VISIT FEB 14. ALSO SPOKE TO THEM ABOUT A DIFFERENT WAFER. Now using paste around wafer.   FEELING BETTER SINCE HE CHANGED LOCATIONS TO [facility]. MAY NEED A KNEE REPLACEMENT.  Past Medical History  Diagnosis Date  . Crohn's colitis     s/p total colectomy with ileostomy in 2009  . Nystagmus   . Bipolar disorder   . Asthma   . GERD (gastroesophageal reflux disease)   . Hyperlipidemia   . Insomnia   . Enterococcus UTI 2009  . 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 DVT (deep venous thrombosis) 2009    right upper extremity due to PICC  . S/P endoscopy Sept 2012    mild gastritis, small  hiatal hernia, no ulcers  . Crohn's disease of small intestine Sept 2012    ileoscopy: multiple ulcers likely secondary to Crohn's  . Schizophrenia, acute     Past Surgical History  Procedure Date  . Total colectomy 2009    with ileostomy at [facility] for refractory disease   . Total shoulder replacement     bilateral  . Total hip arthroplasty     right, due to avascular necrosis from chronic prednisone  . Kidney surgery   . Appendectomy   . Eye surgery   . Esophagogastroduodenoscopy 07/2010    gastritis,  bx neg for H.Pylori  . Small bowel capsule study 08/2010    few tiny nonbleeding erosions/ulcers ?secondary to relafen or Crohn's. Entire small bowel not seen.   . Ileoscopy 06/29/2011    Procedure: ILEOSCOPY THROUGH STOMA;  Surgeon: Dorothyann Peng, MD;  [facility]: [facility];  Service: Endoscopy;  Laterality: N/A;  9:45   Allergies  Allergen Reactions  . Aspirin     REACTION: unknown reaction  . Codeine     REACTION: unknown reaction  . Ibuprofen     REACTION: unknown reaction  . Penicillins     REACTION: unknown reaction  . Tramadol Hcl     REACTION: unknown reaction  . Vicodin (Hydrocodone-Acetaminophen) Nausea Only    Current  Outpatient Prescriptions  Medication Sig Dispense Refill  . acetaminophen (TYLENOL) 500 MG tablet Take 1,000 mg by mouth every 6 (six) hours as needed. For pain       . benztropine (COGENTIN) 0.5 MG tablet Take 0.5 mg by mouth daily.      . calcium-vitamin D (OSCAL WITH D) 500-200 MG-UNIT per tablet Take 1 tablet by mouth 2 (two) times daily.       . cetirizine (ZYRTEC) 10 MG tablet Take 10 mg by mouth daily.        Marland Kitchen darifenacin (ENABLEX) 7.5 MG 24 hr tablet Take 7.5 mg by mouth daily.        . divalproex (DEPAKOTE ER) 250 MG 24 hr tablet Take 250 mg by mouth every evening.      . divalproex (DEPAKOTE) 500 MG 24 hr tablet Take 500 mg by mouth every evening.       . folic acid (FOLVITE) 1 MG tablet Take 1 mg by mouth daily.        .  furosemide (LASIX) 40 MG tablet Take 40 mg by mouth daily.        Marland Kitchen gabapentin (NEURONTIN) 300 MG capsule Take 300 mg by mouth daily.       Marland Kitchen ipratropium (ATROVENT) 0.02 % nebulizer solution Take 500 mcg by nebulization 4 (four) times daily. Shortness of breath      . mesalamine (PENTASA) 250 MG CR capsule Take 1,000 mg by mouth 3 (three) times daily. Taking 4 tablets three times a day      . methotrexate (RHEUMATREX) 2.5 MG tablet Take 15 mg by mouth once a week. Caution:Chemotherapy. Protect from light.patient take on Sunday      . metoprolol succinate (TOPROL-XL) 25 MG 24 hr tablet Take 12.5 mg by mouth every evening.       . Olopatadine HCl (PATADAY) 0.2 % SOLN Apply 1 drop to eye 2 (two) times daily. Patient use in left eye      . omeprazole (PRILOSEC) 20 MG capsule Take 20 mg by mouth 2 (two) times daily.       . ondansetron (ZOFRAN-ODT) 4 MG disintegrating tablet Take 4 mg by mouth every 8 (eight) hours as needed. nausea/vomiting      . pravastatin (PRAVACHOL) 20 MG tablet Take 20 mg by mouth daily.        Marland Kitchen rOPINIRole (REQUIP) 2 MG tablet Take 2 mg by mouth at bedtime.        . Tamsulosin HCl (FLOMAX) 0.4 MG CAPS Take 0.4 mg by mouth daily.        . traZODone (DESYREL) 50 MG tablet Take 50 mg by mouth at bedtime.           Review of Systems LABS/XRAY FROM FEB 2013 ED VISIT REVIEWED-AAS NAIAP, NL CBC, CR. HFP MAR 2013: NL    Objective:   Physical Exam  Constitutional: He is oriented to person, place, and time. He appears well-nourished. No distress.  HENT:  Head: Normocephalic and atraumatic.  Mouth/Throat: Oropharynx is clear and moist. No oropharyngeal exudate.  Eyes: Pupils are equal, round, and reactive to light. No scleral icterus.  Neck: Normal range of motion. Neck supple.  Cardiovascular: Normal rate, regular rhythm and normal heart sounds.   Pulmonary/Chest: Effort normal and breath sounds normal. No respiratory distress.  Abdominal: Soft. Bowel sounds are normal. He  exhibits no distension. There is tenderness (MILD TTP x4). There is no rebound and no guarding.  Musculoskeletal: Normal range of motion.  He exhibits no edema.  Lymphadenopathy:    He has no cervical adenopathy.  Neurological: He is alert and oriented to person, place, and time.       NO  NEW FOCAL DEFICITS   Psychiatric: He has a normal mood and affect.          Assessment & Plan:

## 2011-12-08 NOTE — Assessment & Plan Note (Addendum)
sX CONTROLLED. OCCASIONAL ABDOMINAL CRAMPS AND DIARRHEA DUE TO FOOD INTOLERANCE OR MILDLY ACTIVE DISEASE. ACUTE EPISODE OF NVABD PAIN LIKELY DUE TO A VIRAL ILLNESS.   CONTINUE PENTASA. FOLLOW UP IN 4 MOS. CHECK CBC/DIFF & HFP ONCE A YEAR. PT HAD CD & ON MTX. WILL CHECK LIVER ENZYMES AND BLOOD COUNT ONCE A YEAR UNLESS YOU ARE HAVING PROBLEMS.

## 2011-12-16 NOTE — Progress Notes (Signed)
Reminder in epic to follow up with SF in E30

## 2012-01-25 ENCOUNTER — Telehealth: Payer: Self-pay

## 2012-01-25 NOTE — Telephone Encounter (Signed)
Pt said he uses Convatec bags. 3/4 x 2 1/2 in. ( and he has to cut to fit). They come 10 in a box with one clip. He changes every 2-3 days. He would still like the Rx mailed to him at the address given.

## 2012-01-25 NOTE — Telephone Encounter (Signed)
Please call pt. I need to know which colostomy bags he is using and will be happy to call or fax an order.

## 2012-01-25 NOTE — Telephone Encounter (Signed)
Pt left VM that he needs a prescription for colostomy bags mailed to him at [place], [place]. [id number] ( He is at [facility])

## 2012-01-26 ENCOUNTER — Other Ambulatory Visit: Payer: Self-pay | Admitting: Gastroenterology

## 2012-01-26 NOTE — Telephone Encounter (Signed)
Pt informed I am placing Rx in the mail today.

## 2012-01-26 NOTE — Telephone Encounter (Signed)
RX WRITTEN. #2 BOXES, REFILL X11.

## 2012-02-18 ENCOUNTER — Emergency Department (HOSPITAL_COMMUNITY)
Admission: EM | Admit: 2012-02-18 | Discharge: 2012-02-18 | Disposition: A | Discharge status: Home / Self Care | Payer: Medicaid Other | Attending: Emergency Medicine | Admitting: Emergency Medicine

## 2012-02-18 ENCOUNTER — Emergency Department (HOSPITAL_COMMUNITY): Payer: Medicaid Other

## 2012-02-18 ENCOUNTER — Encounter (HOSPITAL_COMMUNITY): Payer: Self-pay | Admitting: *Deleted

## 2012-02-18 DIAGNOSIS — K5090 Crohn's disease, unspecified, without complications: Secondary | ICD-10-CM | POA: Insufficient documentation

## 2012-02-18 DIAGNOSIS — K469 Unspecified abdominal hernia without obstruction or gangrene: Secondary | ICD-10-CM | POA: Insufficient documentation

## 2012-02-18 DIAGNOSIS — E785 Hyperlipidemia, unspecified: Secondary | ICD-10-CM | POA: Insufficient documentation

## 2012-02-18 DIAGNOSIS — Z8659 Personal history of other mental and behavioral disorders: Secondary | ICD-10-CM | POA: Insufficient documentation

## 2012-02-18 DIAGNOSIS — Z86718 Personal history of other venous thrombosis and embolism: Secondary | ICD-10-CM | POA: Insufficient documentation

## 2012-02-18 DIAGNOSIS — R109 Unspecified abdominal pain: Secondary | ICD-10-CM | POA: Insufficient documentation

## 2012-02-18 DIAGNOSIS — K435 Parastomal hernia without obstruction or  gangrene: Secondary | ICD-10-CM

## 2012-02-18 DIAGNOSIS — N390 Urinary tract infection, site not specified: Secondary | ICD-10-CM | POA: Insufficient documentation

## 2012-02-18 DIAGNOSIS — F319 Bipolar disorder, unspecified: Secondary | ICD-10-CM | POA: Insufficient documentation

## 2012-02-18 DIAGNOSIS — R197 Diarrhea, unspecified: Secondary | ICD-10-CM | POA: Insufficient documentation

## 2012-02-18 DIAGNOSIS — Z79899 Other long term (current) drug therapy: Secondary | ICD-10-CM | POA: Insufficient documentation

## 2012-02-18 DIAGNOSIS — R112 Nausea with vomiting, unspecified: Secondary | ICD-10-CM | POA: Insufficient documentation

## 2012-02-18 HISTORY — DX: Migraine, unspecified, not intractable, without status migrainosus: G43.909

## 2012-02-18 LAB — DIFFERENTIAL
Eosinophils Absolute: 0.1 10*3/uL (ref 0.0–0.7)
Eosinophils Relative: 1 % (ref 0–5)
Lymphs Abs: 2.2 10*3/uL (ref 0.7–4.0)
Monocytes Absolute: 0.8 10*3/uL (ref 0.1–1.0)
Monocytes Relative: 11 % (ref 3–12)

## 2012-02-18 LAB — COMPREHENSIVE METABOLIC PANEL
Alkaline Phosphatase: 89 U/L (ref 39–117)
BUN: 12 mg/dL (ref 6–23)
Calcium: 9.9 mg/dL (ref 8.4–10.5)
Creatinine, Ser: 1.36 mg/dL — ABNORMAL HIGH (ref 0.50–1.35)
GFR calc Af Amer: 71 mL/min — ABNORMAL LOW (ref >90)
Glucose, Bld: 89 mg/dL (ref 70–99)
Total Protein: 7.3 g/dL (ref 6.0–8.3)

## 2012-02-18 LAB — URINALYSIS, ROUTINE W REFLEX MICROSCOPIC
Bilirubin Urine: NEGATIVE
Nitrite: NEGATIVE
Specific Gravity, Urine: 1.01 (ref 1.005–1.030)
Urobilinogen, UA: 0.2 mg/dL (ref 0.0–1.0)

## 2012-02-18 LAB — CBC
Hemoglobin: 14.3 g/dL (ref 13.0–17.0)
MCH: 29.8 pg (ref 26.0–34.0)
MCHC: 34 g/dL (ref 30.0–36.0)
MCV: 87.5 fL (ref 78.0–100.0)
RBC: 4.8 MIL/uL (ref 4.22–5.81)

## 2012-02-18 LAB — URINE MICROSCOPIC-ADD ON

## 2012-02-18 LAB — LIPASE, BLOOD: Lipase: 24 U/L (ref 11–59)

## 2012-02-18 MED ORDER — IOHEXOL 300 MG/ML  SOLN
40.0000 mL | Freq: Once | INTRAMUSCULAR | Status: AC | PRN
  Administered 2012-02-18: 40 mL via ORAL

## 2012-02-18 MED ORDER — MORPHINE SULFATE 4 MG/ML IJ SOLN
4.0000 mg | INTRAMUSCULAR | Status: DC | PRN
  Administered 2012-02-18: 4 mg via INTRAVENOUS
  Filled 2012-02-18: qty 1

## 2012-02-18 MED ORDER — ONDANSETRON 4 MG PO TBDP: 4.0000 mg | ORAL_TABLET | Freq: Three times a day (TID) | ORAL | Status: AC | PRN

## 2012-02-18 MED ORDER — ONDANSETRON HCL 4 MG/2ML IJ SOLN
4.0000 mg | INTRAMUSCULAR | Status: DC | PRN
  Administered 2012-02-18: 4 mg via INTRAVENOUS
  Filled 2012-02-18: qty 2

## 2012-02-18 MED ORDER — CIPROFLOXACIN IN D5W 400 MG/200ML IV SOLN
400.0000 mg | Freq: Once | INTRAVENOUS | Status: AC
  Administered 2012-02-18: 400 mg via INTRAVENOUS
  Filled 2012-02-18: qty 200

## 2012-02-18 MED ORDER — SODIUM CHLORIDE 0.9 % IV SOLN
INTRAVENOUS | Status: DC
  Administered 2012-02-18: 17:00:00 via INTRAVENOUS

## 2012-02-18 MED ORDER — CIPROFLOXACIN HCL 500 MG PO TABS: 500.0000 mg | ORAL_TABLET | Freq: Two times a day (BID) | ORAL | Status: AC

## 2012-02-18 MED ORDER — IOHEXOL 300 MG/ML  SOLN
100.0000 mL | Freq: Once | INTRAMUSCULAR | Status: AC | PRN
  Administered 2012-02-18: 100 mL via INTRAVENOUS

## 2012-02-18 NOTE — ED Notes (Signed)
Gave patient ice water as requested for fluid challenge.

## 2012-02-18 NOTE — ED Notes (Signed)
Ambulated patient around nursing station. O2 saturation maintained at 96%-97% room air. Patient ambulated with little assistance. Gait was steady.

## 2012-02-18 NOTE — ED Notes (Signed)
Patient requesting for nurse to empty colostomy bag. Emptied bag of approximately 65m stool. No blood noted in stool. Caregiver at bedside.

## 2012-02-18 NOTE — Discharge Instructions (Signed)
RESOURCE GUIDE  Dental Problems  Patients with Medicaid: [place] Phone:  [phone]                                                  Phone:  [phone]  If unable to pay or uninsured, contact:  [facility] or [facility]. to become qualified for the adult dental clinic.  Chronic Pain Problems Contact [facility]  [phone] Patients need to be referred by their primary care doctor.  Insufficient Money for Medicine Contact [employer]:  call "211" or [place] [phone].  No Primary Care Doctor Call [facility]  [phone] Other agencies that provide inexpensive medical care    [place]  [phone]    [facility]  [place]  [phone]    [facility]  [phone]    [facility]  [place]  [place]  [phone] [place]   [phone] (emergency services [phone])  [facility]  [phone] [facility] 8017696462 [facility] [place] [phone] Chinita Pester [phone] Residential & Outpatient Substance Abuse Program  [phone]  Abuse/Neglect [place] [phone] [place] [phone] (After Hours)  Emergency [place] [phone]  [place] [facility] [phone] [place] [phone]  MRSA Hotline #:   570-374-5803    [facility] Dept. [address]      [place] Phone:  [phone]                                   Phone:  [phone]                 Phone:  [place] Phone:  [phone]  [place] [phone] (After Hours)    Take the prescriptions as directed.  Increase your fluid intake (ie:  Gatoraide) for the next few days.  Eat a bland diet and advance to your regular diet slowly as you can tolerate it.  Call your regular medical doctor and your Surgeon on Monday to schedule a follow up appointment this week.  Return to the Emergency Department immediately if not improving (or even worsening) despite taking the medicines as prescribed, any black or bloody stool or vomit, if you develop a fever greater than "101," or for any other concerns.

## 2012-02-18 NOTE — ED Notes (Signed)
Pt states abdominal pain, vomiting, and diarrhea since Sunday. Vomited x 5 today.

## 2012-02-18 NOTE — ED Provider Notes (Signed)
History     CSN: [id number]  Arrival date & time 02/18/12  63   First MD Initiated Contact with Patient 02/18/12 1636      Chief Complaint  Patient presents with  . Abdominal Pain  . Emesis  . Diarrhea    HPI Pt was seen at [place].  Per pt, c/o gradual onset and persistence of multiple intermittent episodes of N/V for the past 5 days.  Has been associated with an acute flair of his chronic abd "pain."  Denies any change in his usual pain pattern.  Denies any change in his usual stools from his ileostomy.  Denies fevers, no back pain, no CP/SOB, no cough, no rash, no black or blood in stools or emesis.     Past Medical History  Diagnosis Date  . Crohn's colitis     s/p total colectomy with ileostomy in 2009  . Nystagmus   . Bipolar disorder   . Asthma   . GERD (gastroesophageal reflux disease)   . Hyperlipidemia   . Insomnia   . Enterococcus UTI 2009  . 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 DVT (deep venous thrombosis) 2009    right upper extremity due to PICC  . S/P endoscopy Sept 2012    mild gastritis, small hiatal hernia, no ulcers  . Crohn's disease of small intestine Sept 2012    ileoscopy: multiple ulcers likely secondary to Crohn's  . Schizophrenia, acute   . Migraine headache     Past Surgical History  Procedure Date  . Total colectomy 2009    with ileostomy at [facility] for refractory disease   . Total shoulder replacement     bilateral  . Total hip arthroplasty     right, due to avascular necrosis from chronic prednisone  . Kidney surgery   . Appendectomy   . Eye surgery   . Esophagogastroduodenoscopy 07/2010    gastritis,  bx neg for H.Pylori  . Small bowel capsule study 08/2010    few tiny nonbleeding erosions/ulcers ?secondary to relafen or Crohn's. Entire small bowel not seen.   . Ileoscopy 06/29/2011    Procedure: ILEOSCOPY THROUGH STOMA;  Surgeon: Dorothyann Peng, MD;  Location[facility];  Service:  Endoscopy;  Laterality: N/A;  9:45   History  Substance Use Topics  . Smoking status: Current Everyday Smoker  . Smokeless tobacco: Not on file  . Alcohol Use: No    Review of Systems ROS: Statement: All systems negative except as marked or noted in the HPI; Constitutional: Negative for fever and chills. ; ; Eyes: Negative for eye pain, redness and discharge. ; ; ENMT: Negative for ear pain, hoarseness, nasal congestion, sinus pressure and sore throat. ; ; Cardiovascular: Negative for chest pain, palpitations, diaphoresis, dyspnea and peripheral edema. ; ; Respiratory: Negative for cough, wheezing and stridor. ; ; Gastrointestinal: +N/V, abd pain. Negative for blood in stool, hematemesis, jaundice and rectal bleeding.; ; Genitourinary: Negative for dysuria, flank pain and hematuria.; ; Musculoskeletal: Negative for back pain and neck pain. Negative for swelling and trauma.; ; Skin: Negative for pruritus, rash, abrasions, blisters, bruising and skin lesion.; ; Neuro: Negative for headache, lightheadedness and neck stiffness. Negative for weakness, altered level of consciousness , altered mental status, extremity weakness, paresthesias, involuntary movement, seizure and syncope.      Allergies  Aspirin; Codeine; Ibuprofen; Penicillins; Tramadol hcl; and Vicodin  Home Medications   Current Outpatient Rx  Name Route Sig Dispense Refill  . BENZTROPINE MESYLATE 0.5 MG PO TABS Oral Take 0.5 mg by mouth daily.    [place] CALCIUM 600 + D PO Oral Take 1 tablet by mouth 2 (two) times daily.    [place] CETIRIZINE HCL 10 MG PO TABS Oral Take 10 mg by mouth daily.      [place] DARIFENACIN HYDROBROMIDE ER 7.5 MG PO TB24 Oral Take 7.5 mg by mouth daily.      [place] DIVALPROEX SODIUM ER 250 MG PO TB24 Oral Take 250 mg by mouth every evening.    [place] DIVALPROEX SODIUM ER 500 MG PO TB24 Oral Take 500 mg by mouth every evening.     [place] FOLIC ACID 1 MG PO TABS Oral Take 1 mg by mouth daily.      . FUROSEMIDE 40 MG PO TABS Oral Take 40  mg by mouth daily.      [place] GABAPENTIN 300 MG PO CAPS Oral Take 300 mg by mouth daily.     . IPRATROPIUM BROMIDE 0.02 % IN SOLN Nebulization Take 500 mcg by nebulization 4 (four) times daily as needed. Shortness of breath    . METHOTREXATE 2.5 MG PO TABS Oral Take 15 mg by mouth once a week. Caution:Chemotherapy. Protect from light.patient take on Sunday    . METOPROLOL SUCCINATE ER 25 MG PO TB24 Oral Take 12.5 mg by mouth every evening.     . OLOPATADINE HCL 0.2 % OP SOLN Ophthalmic Apply 1 drop to eye 2 (two) times daily. Patient use in left eye    . OMEPRAZOLE 20 MG PO CPDR Oral Take 20 mg by mouth 2 (two) times daily.     [place] Kitchen PENTASA 250 MG PO CPCR  TAKE 4 CAPSULES BY MOUTH 3 TIMES DAILY. 360 each 5  . PRAVASTATIN SODIUM 20 MG PO TABS Oral Take 20 mg by mouth daily.      [place] Kitchen RISPERIDONE 4 MG PO TABS Oral Take 4 mg by mouth daily.    [place] Kitchen ROPINIROLE HCL 2 MG PO TABS Oral Take 2 mg by mouth at bedtime.      . TAMSULOSIN HCL 0.4 MG PO CAPS Oral Take 0.4 mg by mouth daily.      . TRAZODONE HCL 50 MG PO TABS Oral Take 50 mg by mouth at bedtime.    . ACETAMINOPHEN 500 MG PO TABS Oral Take 1,000 mg by mouth every 6 (six) hours as needed. For pain     . ONDANSETRON 4 MG PO TBDP Oral Take 4 mg by mouth every 8 (eight) hours as needed. nausea/vomiting      BP 113/67  Pulse 87  Temp(Src) 98.2 F (36.8 C) (Oral)  Resp 16  Ht 5' 6"  (1.676 m)  Wt 207 lb (93.895 kg)  BMI 33.41 kg/m2  SpO2 95%  Physical Exam 1650: Physical examination:  Nursing notes reviewed; Vital signs and O2 SAT reviewed;  Constitutional: Well developed, Well nourished, Well hydrated, In no acute distress; Head:  Normocephalic, atraumatic; Eyes: EOMI, PERRL, No scleral icterus; ENMT: Mouth and pharynx normal, Mucous membranes moist; Neck: Supple, Full range of motion, No lymphadenopathy; Cardiovascular: Regular rate and rhythm, No murmur, rub, or gallop; Respiratory: Breath sounds clear & equal bilaterally, No rales, rhonchi, wheezes,  speaking full sentences with ease, Normal respiratory effort/excursion; Chest: Nontender, Movement normal; Abdomen: Soft, +diffusely tender, Nondistended, Normal bowel sounds, +ileostomy with soft brown stool, pink/moist stoma; Extremities: Pulses normal, No tenderness, No edema, No calf edema or asymmetry.; Neuro: AA&Ox3,  Major CN grossly intact.  No gross focal motor or sensory deficits in extremities.; Skin: Color normal, Warm, Dry   ED Course  Procedures   MDM  MDM Reviewed: nursing note, vitals and previous chart Interpretation: labs and CT scan   Results for orders placed during the hospital encounter of 02/18/12  CBC      Component Value Range   WBC 7.0  4.0 - 10.5 (K/uL)   RBC 4.80  4.22 - 5.81 (MIL/uL)   Hemoglobin 14.3  13.0 - 17.0 (g/dL)   HCT 42.0  39.0 - 52.0 (%)   MCV 87.5  78.0 - 100.0 (fL)   MCH 29.8  26.0 - 34.0 (pg)   MCHC 34.0  30.0 - 36.0 (g/dL)   RDW 15.3  11.5 - 15.5 (%)   Platelets 205  150 - 400 (K/uL)  DIFFERENTIAL      Component Value Range   Neutrophils Relative 56  43 - 77 (%)   Neutro Abs 3.9  1.7 - 7.7 (K/uL)   Lymphocytes Relative 32  12 - 46 (%)   Lymphs Abs 2.2  0.7 - 4.0 (K/uL)   Monocytes Relative 11  3 - 12 (%)   Monocytes Absolute 0.8  0.1 - 1.0 (K/uL)   Eosinophils Relative 1  0 - 5 (%)   Eosinophils Absolute 0.1  0.0 - 0.7 (K/uL)   Basophils Relative 0  0 - 1 (%)   Basophils Absolute 0.0  0.0 - 0.1 (K/uL)  COMPREHENSIVE METABOLIC PANEL      Component Value Range   Sodium 136  135 - 145 (mEq/L)   Potassium 3.4 (*) 3.5 - 5.1 (mEq/L)   Chloride 98  96 - 112 (mEq/L)   CO2 25  19 - 32 (mEq/L)   Glucose, Bld 89  70 - 99 (mg/dL)   BUN 12  6 - 23 (mg/dL)   Creatinine, Ser 1.36 (*) 0.50 - 1.35 (mg/dL)   Calcium 9.9  8.4 - 10.5 (mg/dL)   Total Protein 7.3  6.0 - 8.3 (g/dL)   Albumin 4.0  3.5 - 5.2 (g/dL)   AST 20  0 - 37 (U/L)   ALT 18  0 - 53 (U/L)   Alkaline Phosphatase 89  39 - 117 (U/L)   Total Bilirubin 0.5  0.3 - 1.2 (mg/dL)   GFR  calc non Af Amer 61 (*) >90 (mL/min)   GFR calc Af Amer 71 (*) >90 (mL/min)  LIPASE, BLOOD      Component Value Range   Lipase 24  11 - 59 (U/L)  URINALYSIS, ROUTINE W REFLEX MICROSCOPIC      Component Value Range   Color, Urine YELLOW  YELLOW    APPearance HAZY (*) CLEAR    Specific Gravity, Urine 1.010  1.005 - 1.030    pH 6.0  5.0 - 8.0    Glucose, UA NEGATIVE  NEGATIVE (mg/dL)   Hgb urine dipstick TRACE (*) NEGATIVE    Bilirubin Urine NEGATIVE  NEGATIVE    Ketones, ur NEGATIVE  NEGATIVE (mg/dL)   Protein, ur TRACE (*) NEGATIVE (mg/dL)   Urobilinogen, UA 0.2  0.0 - 1.0 (mg/dL)   Nitrite NEGATIVE  NEGATIVE    Leukocytes, UA MODERATE (*) NEGATIVE   URINE MICROSCOPIC-ADD ON      Component Value Range   Squamous Epithelial / LPF RARE  RARE    WBC, UA 21-50  <3 (WBC/hpf)   RBC / HPF 0-2  <3 (RBC/hpf)   Bacteria, UA FEW (*)  RARE    Ct Abdomen Pelvis W Contrast 02/18/2012  *RADIOLOGY REPORT*  Clinical Data: Abdominal pain, nausea and vomiting.  CT ABDOMEN AND PELVIS WITH CONTRAST  Technique:  Multidetector CT imaging of the abdomen and pelvis was performed following the standard protocol during bolus administration of intravenous contrast.  Contrast: 125m OMNIPAQUE IOHEXOL 300 MG/ML  SOLN  Comparison: CT of abdomen and pelvis 11/19/2008.  Findings:  Lung Bases: There are extensive regions of peribronchovascular ground-glass attenuation micro and macro nodularity throughout the lower lobes of the lungs bilaterally, suggesting sequela of recent aspiration.  Abdomen/Pelvis:  The enhanced appearance of the liver, gallbladder, pancreas, spleen, bilateral adrenal glands and bilateral kidneys is unremarkable.  The patient is status post total proctocolectomy.  There is an ileostomy in place in the right upper quadrant of the abdomen, with a large parastomal hernia and multiple loops of small bowel in the subcutaneous fat of the right anterior abdominal wall. This parastomal hernia is new compared to  remote prior study from 11/19/2008.  There is no pathologic distension of small bowel at this time to suggest bowel obstruction.  Multiple reactive size lymph nodes are noted within the mesentery leading up to and within the parastomal hernia.  No ascites or pneumoperitoneum.  The urinary bladder is moderately distended, but is otherwise unremarkable in appearance.  Assessment of the anatomic pelvis is somewhat limited by extensive beam hardening artifact from the patient's right-sided total hip arthroplasty.  The right spermatic cord is not visualized, which could suggest a prior right orchiectomy.  Musculoskeletal: Status post right total hip arthroplasty. There are no aggressive appearing lytic or blastic lesions noted in the visualized portions of the skeleton.  IMPRESSION: 1.  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 2.  No other acute findings in the abdomen or pelvis to account for the patient's symptoms. 3.  However, there is extensive peribronchovascular ground-glass attenuation micro mass or nodularity in the lung bases bilaterally, suggesting sequela of recent aspiration.  Alternatively, with these findings can be seen in the setting of airway infection with either typical or atypical organisms.  Clinical correlation is recommended. 3.  Status post total proctocolectomy. 4.  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 5.  Status post right total hip arthroplasty.  Original Report Authenticated By: DEtheleen Mayhew M.D.       2130:  T/C to General Surgery Dr. JArnoldo Morale case discussed, including:  HPI, pertinent PM/SHx, VS/PE, dx testing, ED course and treatment: states this is not an acute surgical issue unless there is a bowel obstruction (none seen on today's CT scan), pt can f/u in ofc this week prn.  Per EPIC chart review:  Pt has been seen multiple times in GI MD's ofc  for abd pain; abd exam has been with either generalized or parastomal TTP dating back to 05/2011, including on most recent GI MD visit 12/08/2011.  No change on today's exam.  No SBO on CT, having normal stool output from ileostomy.  Normal WBC. +UTI, UC pending.  Will tx with IV cipro due to pt's allergies.    9:46 PM:  Pt has tol PO well while in the ED without N/V.  Has ambulated in the ED with Sats remaining 96-97% R/A, resps without distress, gait steady.  Doubt recent aspiration with normal WBC and O2 Sats.  Wants to go home now.  Dx testing d/w pt and Group Home staff.  Questions answered.  Verb understanding, agreeable to d/c to group home with outpt f/u.         Alfonzo Feller, DO 02/20/12 2359

## 2012-02-18 NOTE — ED Notes (Signed)
Gave patient container to empty colostomy bag.

## 2012-02-20 LAB — URINE CULTURE

## 2012-02-23 ENCOUNTER — Other Ambulatory Visit: Payer: Self-pay | Admitting: Gastroenterology

## 2012-02-23 NOTE — Telephone Encounter (Signed)
Please call pt to let him know he should get this med through his PCP. Thanks

## 2012-02-24 NOTE — Telephone Encounter (Signed)
LMOM for [name]. Called [facility] and informed Donnie.

## 2012-03-20 ENCOUNTER — Encounter: Payer: Self-pay | Admitting: Gastroenterology

## 2012-03-23 ENCOUNTER — Emergency Department (HOSPITAL_COMMUNITY): Payer: Medicaid Other

## 2012-03-23 ENCOUNTER — Emergency Department (HOSPITAL_COMMUNITY)
Admission: EM | Admit: 2012-03-23 | Discharge: 2012-03-23 | Disposition: A | Discharge status: Home / Self Care | Payer: Medicaid Other | Attending: Emergency Medicine | Admitting: Emergency Medicine

## 2012-03-23 ENCOUNTER — Encounter (HOSPITAL_COMMUNITY): Payer: Self-pay

## 2012-03-23 ENCOUNTER — Telehealth: Payer: Self-pay

## 2012-03-23 ENCOUNTER — Telehealth: Payer: Self-pay | Admitting: Internal Medicine

## 2012-03-23 DIAGNOSIS — F209 Schizophrenia, unspecified: Secondary | ICD-10-CM | POA: Insufficient documentation

## 2012-03-23 DIAGNOSIS — K5000 Crohn's disease of small intestine without complications: Secondary | ICD-10-CM

## 2012-03-23 DIAGNOSIS — F319 Bipolar disorder, unspecified: Secondary | ICD-10-CM | POA: Insufficient documentation

## 2012-03-23 DIAGNOSIS — J45909 Unspecified asthma, uncomplicated: Secondary | ICD-10-CM | POA: Insufficient documentation

## 2012-03-23 DIAGNOSIS — Z932 Ileostomy status: Secondary | ICD-10-CM | POA: Insufficient documentation

## 2012-03-23 DIAGNOSIS — R1031 Right lower quadrant pain: Secondary | ICD-10-CM | POA: Insufficient documentation

## 2012-03-23 DIAGNOSIS — R112 Nausea with vomiting, unspecified: Secondary | ICD-10-CM | POA: Insufficient documentation

## 2012-03-23 DIAGNOSIS — E785 Hyperlipidemia, unspecified: Secondary | ICD-10-CM | POA: Insufficient documentation

## 2012-03-23 DIAGNOSIS — R109 Unspecified abdominal pain: Secondary | ICD-10-CM

## 2012-03-23 HISTORY — DX: Unspecified abdominal hernia without obstruction or gangrene: K46.9

## 2012-03-23 LAB — CBC
MCH: 30.3 pg (ref 26.0–34.0)
MCHC: 33.6 g/dL (ref 30.0–36.0)
Platelets: 189 10*3/uL (ref 150–400)

## 2012-03-23 LAB — COMPREHENSIVE METABOLIC PANEL
ALT: 14 U/L (ref 0–53)
Albumin: 3.8 g/dL (ref 3.5–5.2)
Alkaline Phosphatase: 81 U/L (ref 39–117)
BUN: 7 mg/dL (ref 6–23)
Potassium: 3.8 mEq/L (ref 3.5–5.1)
Sodium: 139 mEq/L (ref 135–145)
Total Protein: 7.2 g/dL (ref 6.0–8.3)

## 2012-03-23 LAB — URINE MICROSCOPIC-ADD ON

## 2012-03-23 LAB — LIPASE, BLOOD: Lipase: 35 U/L (ref 11–59)

## 2012-03-23 LAB — URINALYSIS, ROUTINE W REFLEX MICROSCOPIC
Hgb urine dipstick: NEGATIVE
Specific Gravity, Urine: <1.005 — ABNORMAL LOW (ref 1.005–1.030)
Urobilinogen, UA: 0.2 mg/dL (ref 0.0–1.0)

## 2012-03-23 LAB — DIFFERENTIAL
Basophils Absolute: 0 10*3/uL (ref 0.0–0.1)
Basophils Relative: 0 % (ref 0–1)
Eosinophils Absolute: 0.1 10*3/uL (ref 0.0–0.7)
Neutrophils Relative %: 59 % (ref 43–77)

## 2012-03-23 MED ORDER — HYDROMORPHONE HCL PF 1 MG/ML IJ SOLN
1.0000 mg | Freq: Once | INTRAMUSCULAR | Status: AC
  Administered 2012-03-23: 1 mg via INTRAVENOUS
  Filled 2012-03-23: qty 1

## 2012-03-23 MED ORDER — OXYCODONE-ACETAMINOPHEN 5-325 MG PO TABS: 2.0000 | ORAL_TABLET | ORAL | Status: AC | PRN

## 2012-03-23 MED ORDER — PREDNISONE 20 MG PO TABS
40.0000 mg | ORAL_TABLET | Freq: Once | ORAL | Status: AC
  Administered 2012-03-23: 40 mg via ORAL
  Filled 2012-03-23: qty 2

## 2012-03-23 MED ORDER — SODIUM CHLORIDE 0.9 % IV BOLUS (SEPSIS)
1000.0000 mL | Freq: Once | INTRAVENOUS | Status: AC
  Administered 2012-03-23: 1000 mL via INTRAVENOUS

## 2012-03-23 MED ORDER — IOHEXOL 300 MG/ML  SOLN
100.0000 mL | Freq: Once | INTRAMUSCULAR | Status: AC | PRN
  Administered 2012-03-23: 100 mL via INTRAVENOUS

## 2012-03-23 MED ORDER — ONDANSETRON HCL 4 MG/2ML IJ SOLN
4.0000 mg | Freq: Once | INTRAMUSCULAR | Status: AC
  Administered 2012-03-23: 4 mg via INTRAVENOUS
  Filled 2012-03-23: qty 2

## 2012-03-23 MED ORDER — PREDNISONE 20 MG PO TABS: 40.0000 mg | ORAL_TABLET | Freq: Every day | ORAL | Status: DC

## 2012-03-23 MED ORDER — ONDANSETRON HCL 4 MG PO TABS: 4.0000 mg | ORAL_TABLET | Freq: Four times a day (QID) | ORAL | Status: AC

## 2012-03-23 NOTE — ED Notes (Signed)
[name] reports that he has been having severe ab cramping for 2 weeks, went to pmd this week and was told to "lay off meats and chesses", cramping cont. +nausea, +vomiting, last normal bm 3 days ago, pt has colostomy

## 2012-03-23 NOTE — Telephone Encounter (Signed)
Dr.Rancour in the ED called about pt; n and V x days.  CT shows active ileal crohns; neg free air or abscess; no fever; nl wbc; has been maintained on Pentasa.  Doesn't appear toxic per ED MD.  Based on info provided, recommended begin prednisone 40 mg daily; clear liquids x 24-48hrs, then low residue diet.  Arrange f/u w Dr. Oneida Alar no later [facility] 6/24. Cont Pentasa

## 2012-03-23 NOTE — Discharge Instructions (Signed)
Crohn's Disease Take the medications as prescribed. Follow up with Dr. Oneida Alar on Monday.  Return to the ED if you develop new or worsening symptoms. Crohn's disease is a long-term (chronic) soreness and redness (inflammation) of the intestines (bowel). It can affect any portion of the digestive tract, from the mouth to the anus. It can also cause problems outside the digestive tract. Crohn's disease is closely related to a disease called ulcerative colitis (together, these two diseases are called inflammatory bowel disease).  CAUSES  The cause of Crohn's disease is not known. One Link Snuffer is that, in an easily affected (susceptible) person, the immune system is triggered to attack the body's own digestive tissue. Crohn's disease runs in families. It seems to be more common in certain geographic areas and amongst certain races. There are no clear-cut dietary causes.  SYMPTOMS  Crohn's disease can cause many different symptoms since it can affect many different parts of the body. Symptoms include:  Fatigue.   Weight loss.   Chronic diarrhea, sometime bloody.   Abdominal pain and cramps.   Fever.   Ulcers or canker sores in the mouth or rectum.   Anemia (low red blood cells).   Arthritis, skin problems, and eye problems may occur.  Complications of Crohn's disease can include:  Series of holes (perforation) of the bowel.   Portions of the intestines sticking to each other (adhesions).   Obstruction of the bowel.   Fistula formation, typically in the rectal area but also in other areas. A fistula is an opening between the bowels and the outside, or between the bowels and another organ.   A painful crack in the mucous membrane of the anus (rectal fissure).  DIAGNOSIS  Your caregiver may suspect Crohn's disease based on your symptoms and an exam. Blood tests may confirm that there is a problem. You may be asked to submit a stool specimen for examination. X-rays and CT scans may be  necessary. Ultimately, the diagnosis is usually made after a procedure that uses a flexible tube that is inserted via your mouth or your anus. This is done under sedation and is called either an upper endoscopy or colonoscopy. With these tests, the specialist can take tiny tissue samples and remove them from the inside of the bowel (biopsy). Examination of this biopsy tissue under a microscope can reveal Crohn's disease as the cause of your symptoms. Due to the many different forms that Crohn's disease can take, symptoms may be present for several years before a diagnosis is made. HOME CARE INSTRUCTIONS   There is no cure for Crohn's disease. The best treatment is frequent checkups with your caregiver.   Symptoms such as diarrhea can be controlled with medications. Avoid foods that have a laxative effect such as fresh fruit, vegetables and dairy products. During flare ups, you can rest your bowel by refraining from solid foods. Drink clear liquids frequently during the day (electrolyte or re-hydrating fluids are best. Your caregiver can help you with suggestions). Drink often to prevent loss of body fluids (dehydration). When diarrhea has cleared, eat small meals and more frequently. Avoid food additives and stimulants such as caffeine (coffee, tea, or chocolate). Enzyme supplements may help if you develop intolerance to a sugar in dairy products (lactose). Ask your caregiver or dietitian about specific dietary instructions.   Try to maintain a positive attitude. Learn relaxation techniques such as self hypnosis, mental imaging, and muscle relaxation.   If possible, avoid stresses which can aggravate your condition.  Exercise regularly.   Follow your diet.   Always get plenty of rest.  SEEK MEDICAL CARE IF:   Your symptoms fail to improve after a week or two of new treatment.   You experience continued weight loss.   You have ongoing crampy digestion or loose bowels.   You develop a new  skin rash, skin sores, or eye problems.  SEEK IMMEDIATE MEDICAL CARE IF:   You have worsening of your symptoms or develop new symptoms.   You have a fever.   You develop bloody diarrhea.   You develop severe abdominal pain.  MAKE SURE YOU:   Understand these instructions.   Will watch your condition.   Will get help right away if you are not doing well or get worse.  Document Released: 06/30/2005 Document Revised: 09/09/2011 Document Reviewed: 05/29/2007 [facility] Patient Information 2012 [place].

## 2012-03-23 NOTE — Telephone Encounter (Signed)
Mrs. Huel Cote is aware and will take him to the ER.

## 2012-03-23 NOTE — Telephone Encounter (Signed)
SEND PT TO ED.

## 2012-03-23 NOTE — ED Provider Notes (Signed)
History   This chart was scribed for Ezequiel Essex, MD by Carolyne Littles. The patient was seen in room APA07/APA07. Patient's care was started at 1350.    CSN: [id number]  Arrival date & time 03/23/12  1350   First MD Initiated Contact with Patient 03/23/12 1414      Chief Complaint  Patient presents with  . Abdominal Cramping    (Consider location/radiation/quality/duration/timing/severity/associated sxs/prior treatment) HPI BHARAT ANTILLON is a 46 y.o. male who presents to the Emergency Department after referral to ED by Dr. Oneida Alar complaining of intermittent cramping abdominal pain localized to RLQ surrounding region of colostomy bag onset 1 month ago and persistent since with associated new onset of nausea, vomiting and mild hematochezia 2 weeks ago. Patient notes last episode of vomiting occurred just prior to arrival and vomiting is aggravated with eating. Denies fever, SOB, chest pain, decreased fluid intake, Patient with h/o Crohn's Colitis, nystagmus, asthma, HLD, GERD, insomnia, enterococcus UTI, DVT, total colectomy.    GI- [facility]  Past Medical History  Diagnosis Date  . Crohn's colitis     s/p total colectomy with ileostomy in 2009  . Nystagmus   . Bipolar disorder   . Asthma   . GERD (gastroesophageal reflux disease)   . Hyperlipidemia   . Insomnia   . Enterococcus UTI 2009  . 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 DVT (deep venous thrombosis) 2009    right upper extremity due to PICC  . S/P endoscopy Sept 2012    mild gastritis, small hiatal hernia, no ulcers  . Crohn's disease of small intestine Sept 2012    ileoscopy: multiple ulcers likely secondary to Crohn's  . Schizophrenia, acute   . Migraine headache   . Hernia     Past Surgical History  Procedure Date  . Total colectomy 2009    with ileostomy at [facility] for refractory disease   . Total shoulder replacement     bilateral  . Total hip arthroplasty    right, due to avascular necrosis from chronic prednisone  . Kidney surgery   . Appendectomy   . Eye surgery   . Esophagogastroduodenoscopy 07/2010    gastritis,  bx neg for H.Pylori  . Small bowel capsule study 08/2010    few tiny nonbleeding erosions/ulcers ?secondary to relafen or Crohn's. Entire small bowel not seen.   . Ileoscopy 06/29/2011    Procedure: ILEOSCOPY THROUGH STOMA;  Surgeon: Dorothyann Peng, MD;  Location: [facility];  Service: Endoscopy;  Laterality: N/A;  9:45    No family history on file.  History  Substance Use Topics  . Smoking status: Current Everyday Smoker  . Smokeless tobacco: Not on file  . Alcohol Use: No      Review of Systems A complete 10 system review of systems was obtained and all systems are negative except as noted in the HPI and PMH.   Allergies  Aspirin; Codeine; Ibuprofen; Penicillins; Tramadol hcl; and Vicodin  Home Medications   Current Outpatient Rx  Name Route Sig Dispense Refill  . ACETAMINOPHEN 500 MG PO TABS Oral Take 1,000 mg by mouth every 6 (six) hours as needed. For pain     . ALBUTEROL SULFATE HFA 108 (90 BASE) MCG/ACT IN AERS Inhalation Inhale 1-2 puffs into the lungs every 4 (four) hours as needed. For wheezing and/or difficulty with breathing    . BENZTROPINE MESYLATE 0.5 MG PO TABS Oral Take 0.5  mg by mouth daily.    Marland Kitchen CALCIUM 600 + D PO Oral Take 1 tablet by mouth 2 (two) times daily.    Marland Kitchen CETIRIZINE HCL 10 MG PO TABS Oral Take 10 mg by mouth daily.      Marland Kitchen DIVALPROEX SODIUM ER 250 MG PO TB24 Oral Take 250 mg by mouth every evening.    Marland Kitchen DIVALPROEX SODIUM ER 500 MG PO TB24 Oral Take 1,000 mg by mouth every evening.     Marland Kitchen FOLIC ACID 1 MG PO TABS Oral Take 1 mg by mouth daily.      . FUROSEMIDE 40 MG PO TABS Oral Take 40 mg by mouth daily.      Marland Kitchen GABAPENTIN 300 MG PO CAPS Oral Take 900 mg by mouth at bedtime.     . IPRATROPIUM BROMIDE 0.02 % IN SOLN Nebulization Take 500 mcg by nebulization 4 (four) times daily as  needed. Shortness of breath    . METHOTREXATE 2.5 MG PO TABS Oral Take 15 mg by mouth once a week. Caution:Chemotherapy. Protect from light.patient take on Sunday    . METOPROLOL SUCCINATE ER 25 MG PO TB24 Oral Take 12.5 mg by mouth every evening.     . OLOPATADINE HCL 0.2 % OP SOLN Ophthalmic Apply 1 drop to eye 2 (two) times daily. Patient use in left eye    . OMEPRAZOLE 20 MG PO CPDR Oral Take 20 mg by mouth 2 (two) times daily.     Marland Kitchen ONDANSETRON 4 MG PO TBDP Oral Take 4 mg by mouth every 8 (eight) hours as needed. nausea/vomiting    . PENTASA 250 MG PO CPCR  TAKE 4 CAPSULES BY MOUTH 3 TIMES DAILY. 360 each 5  . PRAVASTATIN SODIUM 20 MG PO TABS Oral Take 20 mg by mouth at bedtime.     Marland Kitchen RISPERIDONE 3 MG PO TABS Oral Take 9 mg by mouth at bedtime.    Marland Kitchen ROPINIROLE HCL 2 MG PO TABS Oral Take 2 mg by mouth at bedtime.      . SERTRALINE HCL 50 MG PO TABS Oral Take 50 mg by mouth daily.    Marland Kitchen TAMSULOSIN HCL 0.4 MG PO CAPS Oral Take 0.4 mg by mouth daily.      . BUSPIRONE HCL 5 MG PO TABS Oral Take 5 mg by mouth 3 (three) times daily as needed. For anxiety      BP 103/56  Pulse 71  Temp 98.3 F (36.8 C) (Oral)  Resp 18  Ht 5' 6"  (1.676 m)  Wt 207 lb (93.895 kg)  BMI 33.41 kg/m2  SpO2 96%  Physical Exam  Nursing note and vitals reviewed. Constitutional: He is oriented to person, place, and time. He appears well-developed and well-nourished. No distress.  HENT:  Head: Normocephalic and atraumatic.  Eyes: EOM are normal. Pupils are equal, round, and reactive to light.  Neck: Neck supple. No tracheal deviation present.  Cardiovascular: Normal rate and regular rhythm.  Exam reveals no gallop and no friction rub.   No murmur heard. Pulmonary/Chest: Effort normal. No respiratory distress. He has no wheezes. He has no rales.  Abdominal: Soft. He exhibits no distension. There is tenderness (diffusely). There is no rebound and no guarding.       Colostomy to RLQ is pink and well perfused.     Musculoskeletal: Normal range of motion. He exhibits no edema.  Neurological: He is alert and oriented to person, place, and time. No sensory deficit.  Skin: Skin is  warm and dry.  Psychiatric: He has a normal mood and affect. His behavior is normal.    ED Course  Procedures (including critical care time)  DIAGNOSTIC STUDIES: Oxygen Saturation is 96% on room air, adequate by my interpretation.    COORDINATION OF CARE: 2:22PM- Patient informed of current plan for treatment and evaluation and agrees with plan at this time.   Labs Reviewed  COMPREHENSIVE METABOLIC PANEL - Abnormal; Notable for the following:    GFR calc non Af Amer 79 (*)     All other components within normal limits  URINALYSIS, ROUTINE W REFLEX MICROSCOPIC - Abnormal; Notable for the following:    Color, Urine STRAW (*)     Specific Gravity, Urine <1.005 (*)     Leukocytes, UA SMALL (*)     All other components within normal limits  CBC  DIFFERENTIAL  LIPASE, BLOOD  LACTIC ACID, PLASMA  URINE MICROSCOPIC-ADD ON   Ct Abdomen Pelvis W Contrast  03/23/2012  *RADIOLOGY REPORT*  Clinical Data: Abdominal cramping for 2 weeks, vomiting, history of Crohn's disease, appendectomy, colostomy, no bowel movement for 3 days  CT ABDOMEN AND PELVIS WITH CONTRAST  Technique:  Multidetector CT imaging of the abdomen and pelvis was performed following the standard protocol during bolus administration of intravenous contrast. Sagittal and coronal MPR images reconstructed from axial data set.  Contrast: 113m OMNIPAQUE IOHEXOL 300 MG/ML  SOLN Dilute oral contrast.  Comparison: 02/18/2012  Findings: Minimal dependent atelectasis at lung bases. Liver, spleen, pancreas, kidneys, and right adrenal gland normal appearance. Thickening of left adrenal limbs without discrete mass. Prior total colectomy with right upper quadrant ileostomy. Thickening of tissue planes within the hernia sac identified with new ascites and several normal-sized lymph  nodes. Bowel wall thickening is identified at the ileostomy and the ilium immediately proximal to the ostomy compatible with active Crohn's disease. These changes appear new since the previous exam.  The more proximal small bowel and stomach are normal appearance. Distended bladder without wall thickening. Beam hardening artifacts from right hip prosthesis obscure portions of the pelvis. Few prostatic calcifications noted. No mass, additional adenopathy, or free fluid. No evidence of abscess or bowel perforation. Bones appear demineralized with bilateral spondylolysis of L5 again seen.  IMPRESSION: Prior total colectomy with ileostomy in the right upper quadrant. Bowel wall thickening of the terminal ileum at the ostomy and proximal to the ostomy compatible with Crohn's disease. Associated peri-intestinal inflammatory changes, infiltration, minimal ascites within the hernia sac, and scattered normal-sized lymph nodes. No definite evidence of perforation or abscess. Stable left adrenal thickening.  Findings called to Dr. RWyvonnia Duskyon 03/23/2012 at 1756 hours.  Original Report Authenticated By: MBurnetta Sabin M.D.     No diagnosis found.    MDM  2 weeks of abdominal cramping with nausea, vomiting. Seen here May 17 and had parastomal hernia on CT scan. Says he's been unable to tolerate by mouth since then. Called dr. FOneida Alartoday and was told to come to ED.  CT results discussed with Dr. RGala Romneywho is on call for Dr. fOneida Alar Patient with terminal ileitis in setting of Crohn's disease and ileostomy. His abdomen is nonsurgical. Stable vital signs. His white count is normal. He's had no vomiting in the ED. His pain is well-controlled. There is no criteria for admission. Dr. RGala Romneyrecommends 40 mg of prednisone, no antibiotics and followup in the office next week.    I personally performed the services described in this documentation, which was scribed in  my presence.  The recorded information has been reviewed  and considered.    Ezequiel Essex, MD 03/23/12 [phone]

## 2012-03-23 NOTE — Telephone Encounter (Signed)
Pt is sick for a couple days. No fever. Vomiting when he eats and not wanting to eat.Last BM was Wednesday but not putting out that much in his bag. Please constant Mrs. Rucker at [phone].

## 2012-03-27 ENCOUNTER — Telehealth: Payer: Self-pay | Admitting: Gastroenterology

## 2012-03-27 NOTE — Telephone Encounter (Signed)
Please advise a date and time this patient can be seen by SF in E30. Pt has OV for July, but RMR wants patient to be seen by [facility] before 6/24.

## 2012-03-28 NOTE — Telephone Encounter (Signed)
CALLED PT TO F/U. LEFT VM-CALL [phone].

## 2012-03-28 NOTE — Telephone Encounter (Signed)
LMOM for patient to be here in the morning at 1130 with SF. I asked for a return call to confirm he got my message.

## 2012-03-28 NOTE — Telephone Encounter (Signed)
PUT PT IN 1130 SLOT 6/26

## 2012-03-29 ENCOUNTER — Encounter: Payer: Self-pay | Admitting: Gastroenterology

## 2012-03-29 ENCOUNTER — Ambulatory Visit (INDEPENDENT_AMBULATORY_CARE_PROVIDER_SITE_OTHER): Payer: Medicaid Other | Admitting: Gastroenterology

## 2012-03-29 VITALS — BP 100/64 | HR 67 | Temp 96.7°F | Ht 66.0 in | Wt 205.2 lb

## 2012-03-29 DIAGNOSIS — IMO0002 Reserved for concepts with insufficient information to code with codable children: Secondary | ICD-10-CM

## 2012-03-29 DIAGNOSIS — K5000 Crohn's disease of small intestine without complications: Secondary | ICD-10-CM

## 2012-03-29 DIAGNOSIS — K9419 Other complications of enterostomy: Secondary | ICD-10-CM

## 2012-03-29 DIAGNOSIS — K433 Parastomal hernia with obstruction, without gangrene: Secondary | ICD-10-CM | POA: Insufficient documentation

## 2012-03-29 MED ORDER — PREDNISONE 20 MG PO TABS: ORAL_TABLET | ORAL | Status: DC

## 2012-03-29 NOTE — Assessment & Plan Note (Addendum)
SX OF MILDLY ACTIVE DISEASE. NOW ON PREDNISONE TAPER. SX IMPROVED BUT NOT RESOLVED.   SEE DR. Morton Stall ASAP TO EVALUATE ABD PAIN/HERNIA.  FINISH STEROID TAPER: PREDNISONE 20 MG DAILY FOR 2 WEEK THEN 10 MG QD FOR 2 WEEKS.  CONTINUE PENTASA.  STOP SMOKING.  FOLLOW A VEGETARIAN DIET. YOU MAY HAVE EGGS. HANDOUT GIVEN.  FOLLOW UP IN 2 MOS.

## 2012-03-29 NOTE — Progress Notes (Signed)
Faxed to PCP

## 2012-03-29 NOTE — Telephone Encounter (Signed)
OPV TODAY.

## 2012-03-29 NOTE — Patient Instructions (Addendum)
Vegetarian Diets Many foods in the vegetarian diet (nuts, legumes, seeds, whole grains, and fresh fruits and vegetables) contain large amounts of fiber, which give a feeling of fullness (satiation) after meals. Vegetarian eating plans may provide significant health benefits including lowering rates of heart disease, obesity, diabetes, cancer, and high blood pressure. A vegetarian diet is usually low in cholesterol and saturated fat due to the high amount of grains, fruits, and vegetables and the elimination of meats. In addition, a vegetarian diet may have some advantages for people with diabetes. These advantages include:  Helping to maintain normal blood glucose levels.   Promoting a healthy weight.   Improving blood glucose control and insulin response.   Reducing the risk for cardiovascular disease.  TYPES OF VEGETARIAN DIETS Vegans. These are vegetarians who consume only plant food. No animal foods of any kind are eaten. Anyone following such a diet will need to select fortified foods or take a vitamin B12 supplement. Vegans also may need a vitamin D supplement if sun exposure is limited. Some foods are fortified with vitamin D that can be chosen as well.  Lacto-vegetarians. These are vegetarians who eat dairy products (milk, cheese, and yogurt) in addition to plant foods. They do not eat meat, poultry, fish, or eggs. Lacto-ovo vegetarians. These are vegetarians who eat plant foods, eggs, and dairy products, but do not eat meat, fish, or poultry. LIMITING PROTEINS Meals should be planned to provide adequate sources of proteins as they may be limited in an unbalanced vegetarian diet. Good sources of proteins include beans or legumes, soy products, nuts, seeds, eggs, milk, and cheese. The list below includes food sources of protein. Your Registered Dietitian can help you determine your individual protein needs.  Beans and Peas (Dried and Cooked) Each serving represents 7 to 8 grams of  protein.  Black beans, 1 cup.   Black-eyed peas, 1 cup.   Calico, 1 cup.   Garbanzo, chickpeas, ? cup.   Kidney beans, 1 cup.   Lentils, 1 cup.   Lima beans, 1 cup.   Mung beans, 2 cups.   Navy beans, ? cup.   Pinto beans, ? cup.   Split peas, ? cup.   Split pea soup, 1 cup.  Meat Substitutes Each serving represents 7 to 8 grams of protein.  Bacon substitute, 2 tbs.   Hummus,  cup.   Soybeans, ? cup.   Textured vegetable protein,  oz.   Tofu (2 inch x 2 inch x 1 inch cube),  cup (4 oz).  Nuts and Seeds Each serving represents 7 to 8 grams of protein.  Almonds, dry-roasted,  cup (1 oz).   Bolivia nuts,  cup (1 oz).   Butternuts,  cup (1 oz).   Peanuts, roasted,  cup (1 oz).   Pecans,  cup (1 oz).   Pignolias, pine nuts, 2 tbs.   Pistachios, 60 nuts (1 oz).   Pumpkin or squash seeds,  cup.   Sunflower seeds (no hull),  cup.   Sunflower seeds (with hulls),  cup.   Walnuts,  cup (1 oz).  Milk/Milk Substitutes Each serving represents 7 to 8 grams of protein.  Soy milk, fortified, 1 cup.   Goat milk, 1 cup.   Kefir, 1 cup.  SAMPLE MEAL PLAN - 2000 CALORIES  Carbohydrate: 276 grams (55% of total calories).   Protein: 95 grams (19% of total calories).   Fat: 60 grams (26% of total calories).  Breakfast  Skim milk, 1 cup (8g*).  Orange juice,  cup.   1 slice whole-wheat toast (3g).   Flaked corn cereal,  cup (3g).   Margarine, 2 tsp.  Morning snack  1 apple or 6 whole-wheat crackers (3g).  Noon meal  Skim milk, 1 cup (8g).   Cottage cheese,  cup (8g).   1 medium orange.   1 pita bread filled with  cup garbanzo spread, chopped tomatoes, onions, and lettuce (15g).   Tahini sauce, 1 tsp.  Afternoon snack   banana.   4 light, crispy rye crackers (3g).  Evening meal  Green salad with bean sprouts (4g).   Pineapple with juice, ? cup.   Spaghetti,  cup (3g).   Lentil spaghetti sauce, 1 cup (3g).    [place] dressing, 1 tbs.  Evening snack  Skim milk, 1 cup (8g).   Peanuts,  cup (7g).   Popcorn (lightly salted, no butter), 3 cups (3g).  * Grams of protein. Document Released: 09/23/2003 Document Revised: 09/09/2011 Document Reviewed: 03/11/2011 [facility] Patient Information 2012 [place].

## 2012-03-29 NOTE — Progress Notes (Signed)
  Subjective:    Patient ID: [name], male    DOB: 12-26-1965, 46 y.o.   MRN: [id number]  PCP: Legrand Rams  HPI  PAIN/SWELLING IN LEFT ABD AROUND OSTOMY SITE. SEEN IN ED AND CT SHOWS HERNIA AROUND OSTOMY SITE. RARE BLEEDING-SML AMOUNT. LAST WEEK. EVIDENCE OF ACTIVE CROHN'S ON CT AS WELL. ON PREDNISONE FOR ~1 WEEK. FEELS A LITTLE BETTER. CHANGING BAG Q2-3 DAYS. MILD TO SEVERE NAUSEA EVERY OTHER DAY. NO FEVER OR CHILLS. WITH PAIN NO INCREASE IN THE NUMBER OF STOOLS. VOMITING: 1X/WK. BLACK STOOL: ONCE A MONTH. LOST 3 LBS SINCE MAR 2013. SMOKES 1 PACK CIGS DAILY.   Past Medical History  Diagnosis Date  . Crohn's colitis     s/p total colectomy with ileostomy in 2009  . Nystagmus   . Bipolar disorder   . Asthma   . GERD (gastroesophageal reflux disease)   . Hyperlipidemia   . Insomnia   . Enterococcus UTI 2009  . 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 DVT (deep venous thrombosis) 2009    right upper extremity due to PICC  . S/P endoscopy Sept 2012    mild gastritis, small hiatal hernia, no ulcers  . Crohn's disease of small intestine Sept 2012    ileoscopy: multiple ulcers likely secondary to Crohn's  . Schizophrenia, acute   . Migraine headache   . Hernia        Review of Systems     Objective:   Physical Exam  Constitutional: He is oriented to person, place, and time. No distress.  HENT:  Head: Normocephalic and atraumatic.  Mouth/Throat: Oropharynx is clear and moist. No oropharyngeal exudate.  Cardiovascular: Normal rate, regular rhythm and normal heart sounds.   Pulmonary/Chest: Effort normal and breath sounds normal. No respiratory distress.  Abdominal: Soft. Bowel sounds are normal. He exhibits no distension. There is tenderness.       MOD TTP AROUND OSTOMY SITE ASSOCIATED WITH PROMINENCE FROM 12-6 O' CLOCK. BROWN STOOL IN BAG. ERYTHEMA OF STOMA. NO BLOOD IN BAG.  Musculoskeletal: He exhibits edema.       TRACE    Neurological: He is alert and oriented to person, place, and time.       NO NEW FOCAL DEFICITS    Psychiatric: He has a normal mood and affect.          Assessment & Plan:

## 2012-03-29 NOTE — Assessment & Plan Note (Addendum)
ASSOCIATED WITH MODERATE PAIN.  SEE DR. Morton Stall ASAP TO EVALUATE ABD PAIN/HERNIA.  FINISH STEROID TAPER.  CONTINUE PENTASA.  STOP SMOKING.  FOLLOW UP IN 2 MOS.

## 2012-03-30 ENCOUNTER — Telehealth: Payer: Self-pay | Admitting: Gastroenterology

## 2012-03-30 NOTE — Progress Notes (Signed)
Reminder in epic to follow up in 2 months with SF in E30

## 2012-03-30 NOTE — Telephone Encounter (Signed)
Referral Made to [facility] they will notify the patient to schedule the appointment and patient is aware

## 2012-04-11 ENCOUNTER — Emergency Department (HOSPITAL_COMMUNITY): Payer: Medicaid Other

## 2012-04-11 ENCOUNTER — Emergency Department (HOSPITAL_COMMUNITY)
Admission: EM | Admit: 2012-04-11 | Discharge: 2012-04-11 | Disposition: A | Discharge status: Home / Self Care | Payer: Medicaid Other | Attending: Emergency Medicine | Admitting: Emergency Medicine

## 2012-04-11 ENCOUNTER — Encounter (HOSPITAL_COMMUNITY): Payer: Self-pay | Admitting: *Deleted

## 2012-04-11 DIAGNOSIS — F319 Bipolar disorder, unspecified: Secondary | ICD-10-CM | POA: Insufficient documentation

## 2012-04-11 DIAGNOSIS — Z933 Colostomy status: Secondary | ICD-10-CM | POA: Insufficient documentation

## 2012-04-11 DIAGNOSIS — J45909 Unspecified asthma, uncomplicated: Secondary | ICD-10-CM | POA: Insufficient documentation

## 2012-04-11 DIAGNOSIS — Z96649 Presence of unspecified artificial hip joint: Secondary | ICD-10-CM | POA: Insufficient documentation

## 2012-04-11 DIAGNOSIS — K5090 Crohn's disease, unspecified, without complications: Secondary | ICD-10-CM

## 2012-04-11 DIAGNOSIS — R109 Unspecified abdominal pain: Secondary | ICD-10-CM | POA: Insufficient documentation

## 2012-04-11 DIAGNOSIS — Z86718 Personal history of other venous thrombosis and embolism: Secondary | ICD-10-CM | POA: Insufficient documentation

## 2012-04-11 DIAGNOSIS — K219 Gastro-esophageal reflux disease without esophagitis: Secondary | ICD-10-CM | POA: Insufficient documentation

## 2012-04-11 DIAGNOSIS — Z9049 Acquired absence of other specified parts of digestive tract: Secondary | ICD-10-CM | POA: Insufficient documentation

## 2012-04-11 DIAGNOSIS — E785 Hyperlipidemia, unspecified: Secondary | ICD-10-CM | POA: Insufficient documentation

## 2012-04-11 HISTORY — DX: Colostomy status: Z93.3

## 2012-04-11 LAB — CBC WITH DIFFERENTIAL/PLATELET
Basophils Absolute: 0 10*3/uL (ref 0.0–0.1)
HCT: 47.4 % (ref 39.0–52.0)
Hemoglobin: 16 g/dL (ref 13.0–17.0)
Lymphocytes Relative: 38 % (ref 12–46)
Lymphs Abs: 2.5 10*3/uL (ref 0.7–4.0)
Monocytes Absolute: 0.5 10*3/uL (ref 0.1–1.0)
Monocytes Relative: 7 % (ref 3–12)
Neutro Abs: 3.5 10*3/uL (ref 1.7–7.7)
RBC: 5.29 MIL/uL (ref 4.22–5.81)
WBC: 6.7 10*3/uL (ref 4.0–10.5)

## 2012-04-11 LAB — COMPREHENSIVE METABOLIC PANEL
AST: 26 U/L (ref 0–37)
BUN: 9 mg/dL (ref 6–23)
CO2: 30 mEq/L (ref 19–32)
Chloride: 99 mEq/L (ref 96–112)
Creatinine, Ser: 1.14 mg/dL (ref 0.50–1.35)
GFR calc non Af Amer: 76 mL/min — ABNORMAL LOW (ref >90)
Total Bilirubin: 0.9 mg/dL (ref 0.3–1.2)

## 2012-04-11 MED ORDER — OXYCODONE-ACETAMINOPHEN 5-325 MG PO TABS: 1.0000 | ORAL_TABLET | Freq: Four times a day (QID) | ORAL | Status: AC | PRN

## 2012-04-11 MED ORDER — PREDNISONE 20 MG PO TABS: 60.0000 mg | ORAL_TABLET | Freq: Every day | ORAL | Status: AC

## 2012-04-11 MED ORDER — ONDANSETRON HCL 4 MG/2ML IJ SOLN
4.0000 mg | Freq: Once | INTRAMUSCULAR | Status: AC
  Administered 2012-04-11: 4 mg via INTRAVENOUS
  Filled 2012-04-11: qty 2

## 2012-04-11 MED ORDER — MORPHINE SULFATE 4 MG/ML IJ SOLN
4.0000 mg | Freq: Once | INTRAMUSCULAR | Status: AC
  Administered 2012-04-11: 4 mg via INTRAVENOUS
  Filled 2012-04-11: qty 1

## 2012-04-11 MED ORDER — SODIUM CHLORIDE 0.9 % IV SOLN
Freq: Once | INTRAVENOUS | Status: AC
  Administered 2012-04-11: 16:00:00 via INTRAVENOUS

## 2012-04-11 NOTE — ED Notes (Signed)
Describes pain as stabbing

## 2012-04-11 NOTE — ED Notes (Signed)
Pt c/o abd pain,bright red blood in colostomy bag all weekend with black stool that started over the weekend, admits to n/v,

## 2012-04-11 NOTE — ED Provider Notes (Signed)
History   This chart was scribed for Veryl Speak, MD by Kathreen Cornfield. The patient was seen in room APA12/APA12 and the patient's care was started at 3:08 PM     CSN: [id number]  Arrival date & time 04/11/12  1343   First MD Initiated Contact with Patient 04/11/12 1500      Chief Complaint  Patient presents with  . Abdominal Pain    (Consider location/radiation/quality/duration/timing/severity/associated sxs/prior treatment) HPI  [name] is a 46 y.o. male who presents to the Emergency Department complaining of moderate, episodic abdominal pain onset two days ago with associated symptoms of vommiting, weakness, hematochezia. Pt reports his abdomen is hurting all over. The pt informs the EDP that he has been having bright red stools recently. Pt has a hx of abdominal pain (last episode was one month ago), chron's disease, cholecystectomy (2009). Pt denies dysuria, difficulty urinating.   Pt lives in a Family home in [place], [place].   PCP is Dr. Legrand Rams.    Past Medical History  Diagnosis Date  . Crohn's colitis     s/p total colectomy with ileostomy in 2009  . Nystagmus   . Bipolar disorder   . Asthma   . GERD (gastroesophageal reflux disease)   . Hyperlipidemia   . Insomnia   . Enterococcus UTI 2009  . 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 DVT (deep venous thrombosis) 2009    right upper extremity due to PICC  . S/P endoscopy Sept 2012    mild gastritis, small hiatal hernia, no ulcers  . Crohn's disease of small intestine Sept 2012    ileoscopy: multiple ulcers likely secondary to Crohn's  . Schizophrenia, acute   . Migraine headache   . Hernia   . Colostomy in place     Past Surgical History  Procedure Date  . Total colectomy 2009    with ileostomy at [facility] for refractory disease   . Total shoulder replacement     bilateral  . Total hip arthroplasty     right, due to avascular necrosis from chronic prednisone    . Kidney surgery   . Appendectomy   . Eye surgery   . Esophagogastroduodenoscopy 07/2010    gastritis,  bx neg for H.Pylori  . Small bowel capsule study 08/2010    few tiny nonbleeding erosions/ulcers ?secondary to relafen or Crohn's. Entire small bowel not seen.   . Ileoscopy 06/29/2011    Procedure: ILEOSCOPY THROUGH STOMA;  Surgeon: Dorothyann Peng, MD;  Location: [facility];  Service: Endoscopy;  Laterality: N/A;  9:45    No family history on file.  History  Substance Use Topics  . Smoking status: Current Everyday Smoker -- 1.0 packs/day  . Smokeless tobacco: Not on file  . Alcohol Use: No      Review of Systems  All other systems reviewed and are negative.    10 Systems reviewed and all are negative for acute change except as noted in the HPI.    Allergies  Aspirin; Codeine; Ibuprofen; Penicillins; Tramadol hcl; and Vicodin  Home Medications   Current Outpatient Rx  Name Route Sig Dispense Refill  . ACETAMINOPHEN 500 MG PO TABS Oral Take 1,000 mg by mouth every 6 (six) hours as needed. For pain     . ALBUTEROL SULFATE HFA 108 (90 BASE) MCG/ACT IN AERS Inhalation Inhale 1-2 puffs into the lungs every 4 (four) hours as needed. For wheezing  and/or difficulty with breathing    . BENZTROPINE MESYLATE 0.5 MG PO TABS Oral Take 0.5 mg by mouth daily.    . BUSPIRONE HCL 5 MG PO TABS Oral Take 5 mg by mouth 3 (three) times daily as needed. For anxiety    . CALCIUM 600 + D PO Oral Take 1 tablet by mouth 2 (two) times daily.    Marland Kitchen CETIRIZINE HCL 10 MG PO TABS Oral Take 10 mg by mouth daily.      Marland Kitchen DIVALPROEX SODIUM ER 250 MG PO TB24 Oral Take 250 mg by mouth every evening.    Marland Kitchen DIVALPROEX SODIUM ER 500 MG PO TB24 Oral Take 1,000 mg by mouth every evening.     Marland Kitchen FOLIC ACID 1 MG PO TABS Oral Take 1 mg by mouth daily.      . FUROSEMIDE 40 MG PO TABS Oral Take 40 mg by mouth daily.      Marland Kitchen GABAPENTIN 300 MG PO CAPS Oral Take 900 mg by mouth at bedtime.     . METHOTREXATE 2.5 MG  PO TABS Oral Take 15 mg by mouth once a week. Caution:Chemotherapy. Protect from light.patient take on Sunday    . METOPROLOL SUCCINATE ER 25 MG PO TB24 Oral Take 12.5 mg by mouth every evening.     . OLOPATADINE HCL 0.2 % OP SOLN Ophthalmic Apply 1 drop to eye 2 (two) times daily. Patient use in left eye    . OMEPRAZOLE 20 MG PO CPDR Oral Take 20 mg by mouth 2 (two) times daily.     Marland Kitchen PENTASA 250 MG PO CPCR  TAKE 4 CAPSULES BY MOUTH 3 TIMES DAILY. 360 each 5  . PRAVASTATIN SODIUM 20 MG PO TABS Oral Take 20 mg by mouth at bedtime.     Marland Kitchen PREDNISONE 20 MG PO TABS  1 PO DAILY FOR 2 WEEKS THE 1/2 PO DAILY FOR 2 WEEKS. 10 tablet 0  . RISPERIDONE 3 MG PO TABS Oral Take 9 mg by mouth at bedtime.    Marland Kitchen ROPINIROLE HCL 2 MG PO TABS Oral Take 2 mg by mouth at bedtime.      . SERTRALINE HCL 50 MG PO TABS Oral Take 50 mg by mouth daily.    Marland Kitchen TAMSULOSIN HCL 0.4 MG PO CAPS Oral Take 0.4 mg by mouth daily.      . IPRATROPIUM BROMIDE 0.02 % IN SOLN Nebulization Take 500 mcg by nebulization 4 (four) times daily as needed. Shortness of breath      BP 102/66  Pulse 76  Temp 97.9 F (36.6 C)  Resp 20  Ht 5' 6"  (1.676 m)  Wt 200 lb (90.719 kg)  BMI 32.28 kg/m2  SpO2 98%  Physical Exam  Nursing note and vitals reviewed. Constitutional: He appears well-developed and well-nourished.  HENT:  Head: Atraumatic.  Nose: Nose normal.  Cardiovascular: Normal rate, regular rhythm and normal heart sounds.   Pulmonary/Chest: Effort normal and breath sounds normal. He has no wheezes.  Abdominal: Soft. There is tenderness. There is no rebound and no guarding.       Cholostomy bag in place with brown stool, mild vague tenderness to all four quadrants.  Musculoskeletal:       No CVA tenderness.   Neurological: He is alert.  Skin: Skin is warm and dry.  Psychiatric: He has a normal mood and affect. His behavior is normal.    ED Course  Procedures (including critical care time)  DIAGNOSTIC STUDIES: Oxygen  Saturation  is 98% on room air, normal by my interpretation.    COORDINATION OF CARE:    3:12PM- EDP at bedside discusses treatment plan concerning blood work, abdominal x-ray.   Results for orders placed during the hospital encounter of 04/11/12  CBC WITH DIFFERENTIAL      Component Value Range   WBC 6.7  4.0 - 10.5 K/uL   RBC 5.29  4.22 - 5.81 MIL/uL   Hemoglobin 16.0  13.0 - 17.0 g/dL   HCT 47.4  39.0 - 52.0 %   MCV 89.6  78.0 - 100.0 fL   MCH 30.2  26.0 - 34.0 pg   MCHC 33.8  30.0 - 36.0 g/dL   RDW 14.7  11.5 - 15.5 %   Platelets 176  150 - 400 K/uL   Neutrophils Relative 53  43 - 77 %   Neutro Abs 3.5  1.7 - 7.7 K/uL   Lymphocytes Relative 38  12 - 46 %   Lymphs Abs 2.5  0.7 - 4.0 K/uL   Monocytes Relative 7  3 - 12 %   Monocytes Absolute 0.5  0.1 - 1.0 K/uL   Eosinophils Relative 2  0 - 5 %   Eosinophils Absolute 0.1  0.0 - 0.7 K/uL   Basophils Relative 0  0 - 1 %   Basophils Absolute 0.0  0.0 - 0.1 K/uL  COMPREHENSIVE METABOLIC PANEL      Component Value Range   Sodium 140  135 - 145 mEq/L   Potassium 4.0  3.5 - 5.1 mEq/L   Chloride 99  96 - 112 mEq/L   CO2 30  19 - 32 mEq/L   Glucose, Bld 95  70 - 99 mg/dL   BUN 9  6 - 23 mg/dL   Creatinine, Ser 1.14  0.50 - 1.35 mg/dL   Calcium 9.9  8.4 - 10.5 mg/dL   Total Protein 7.3  6.0 - 8.3 g/dL   Albumin 4.3  3.5 - 5.2 g/dL   AST 26  0 - 37 U/L   ALT 35  0 - 53 U/L   Alkaline Phosphatase 78  39 - 117 U/L   Total Bilirubin 0.9  0.3 - 1.2 mg/dL   GFR calc non Af Amer 76 (*) >90 mL/min   GFR calc Af Amer 88 (*) >90 mL/min  LIPASE, BLOOD      Component Value Range   Lipase 25  11 - 59 U/L     Dg Abd Acute W/chest  04/11/2012  *RADIOLOGY REPORT*  Clinical Data: Abdominal pain.  History of colectomy for Crohn's disease  ACUTE ABDOMEN SERIES (ABDOMEN 2 VIEW & CHEST 1 VIEW)  Comparison: CT abdomen and pelvis 03/23/2012, 02/18/2012.  Two-view abdomen x-ray and portable chest x-ray 11/24/2011.  Findings: Bowel gas pattern  unremarkable without evidence of obstruction or significant ileus.  No evidence of free air or significant air fluid levels on the erect image.  Prior right hip arthroplasty with anatomic alignment.  Regional skeleton otherwise unremarkable.  Cardiomediastinal silhouette unremarkable, unchanged.  Lungs clear. Bronchovascular markings normal.  Pulmonary vascularity normal.  No pneumothorax.  No pleural effusions.  Right jugular Port-A-Cath tip in the SVC.  Bilateral shoulder arthroplasties with anatomic alignment.  IMPRESSION: No acute abdominal or pulmonary abnormality.  Original Report Authenticated By: Deniece Portela, M.D.      No diagnosis found.    MDM  The labs look fine.  There is no elevation of wbc and no evidence of obstruction on the abd  series.  I suspect this is a flare of his crohn's disease, and given the benign exam and workup, is appropriate for discharge to the ecf.  He will be given a prescription for percocet and prednisone.  He is to follow up with his GI, surgeon at [facility].      I personally performed the services described in this documentation, which was scribed in my presence. The recorded information has been reviewed and considered.      Veryl Speak, MD 04/11/12 [phone]

## 2012-04-11 NOTE — ED Notes (Signed)
Released to [facility]

## 2012-04-11 NOTE — ED Notes (Signed)
Pt presents with chronic abdominal pain at rt ostomy site. Pt states has had "blood in colostomy bag x 3 days, can't get in to see MD at [facility]". Pt is alert, talkative with no distress noted.

## 2012-04-14 ENCOUNTER — Telehealth: Payer: Self-pay | Admitting: Gastroenterology

## 2012-04-14 NOTE — Telephone Encounter (Signed)
Patient is scheduled to see Dr. Morton Stall on Friday July 19th at 11:15 and I LMOM to return my call to confirm this message.

## 2012-04-17 ENCOUNTER — Ambulatory Visit: Payer: Medicaid Other | Admitting: Urgent Care

## 2012-05-02 ENCOUNTER — Telehealth: Payer: Self-pay | Admitting: Gastroenterology

## 2012-05-02 MED ORDER — OMEPRAZOLE 20 MG PO CPDR: 20.0000 mg | DELAYED_RELEASE_CAPSULE | Freq: Two times a day (BID) | ORAL | Status: DC

## 2012-05-02 NOTE — Telephone Encounter (Signed)
[employer] called for [employer]. He is out of refills of Omeprazole and will need it Thursday. He uses [facility].

## 2012-05-02 NOTE — Telephone Encounter (Signed)
done

## 2012-05-12 ENCOUNTER — Telehealth: Payer: Self-pay

## 2012-05-12 ENCOUNTER — Encounter: Payer: Self-pay | Admitting: Gastroenterology

## 2012-05-12 NOTE — Telephone Encounter (Signed)
Call from Dr. Lenise Arena from [place]. He wanted to Speak to Dr. Oneida Alar, but since she is on vacation, he would like the patient seen early next week for the Crohn's. He reviewed the recent CT findings and saw pt in his office. He does not feel that the pt's pain is due to the hernia. He said that he will make the pt aware that we will contact him Monday and schedule for OV with one of the extenders in Dr. Oneida Alar absence.

## 2012-05-15 ENCOUNTER — Encounter: Payer: Self-pay | Admitting: Gastroenterology

## 2012-05-15 ENCOUNTER — Ambulatory Visit (INDEPENDENT_AMBULATORY_CARE_PROVIDER_SITE_OTHER): Payer: Medicaid Other | Admitting: Gastroenterology

## 2012-05-15 VITALS — BP 120/80 | HR 72 | Temp 97.1°F | Ht 66.0 in | Wt 195.4 lb

## 2012-05-15 DIAGNOSIS — IMO0002 Reserved for concepts with insufficient information to code with codable children: Secondary | ICD-10-CM

## 2012-05-15 DIAGNOSIS — K9419 Other complications of enterostomy: Secondary | ICD-10-CM

## 2012-05-15 DIAGNOSIS — K5000 Crohn's disease of small intestine without complications: Secondary | ICD-10-CM

## 2012-05-15 NOTE — Progress Notes (Signed)
Primary Care Physician: Rosita Fire, MD  Primary Gastroenterologist:  Barney Drain, MD    Chief Complaint  Patient presents with  . Follow-up    HPI: [name] is a 46 y.o. male here for f/u Crohn's disease. He has h/o Crohn's colitis s/p total proctocolectomy with end ileotomy in 2009 at [facility] for refractory disease.  Last seen here on 03/29/2012. He was referred to Dr. 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 Reviewed available records from [facility]. He was seen 04/21/12. At that time, Dr. Morton Stall reported need to get CT scans for review. Patient seen 05/12/12, that note not available. According to patient, he did not recommend surgery and he has f/u in 07/2012.    Patient reports good and bad days. Bad days may last for one day only. Couple of days ago, emptied ostomy bag 30 times. Yesterday only 4 times. Denies black or bloody stools. States his abdominal pain is actually better. No longer having left sided pain, now only with pain around stoma. Good appetite but notes significant dietary restrictions in efforts to loose weight. He weight is down from 234lb in 05/2011 to 195 today.  Still smoking, but reports cut back to 1/2 ppd.    Current Outpatient Prescriptions  Medication Sig Dispense Refill  . acetaminophen (TYLENOL) 500 MG tablet Take 1,000 mg by mouth every 6 (six) hours as needed. For pain       . albuterol (VENTOLIN HFA) 108 (90 BASE) MCG/ACT inhaler Inhale 1-2 puffs into the lungs every 4 (four) hours as needed. For wheezing and/or difficulty with breathing      . benztropine (COGENTIN) 0.5 MG tablet Take 0.5 mg by mouth daily.       . busPIRone (BUSPAR) 5 MG tablet Take 5 mg by mouth 3 (three) times daily as needed. For anxiety      . Calcium Carbonate-Vitamin D (CALCIUM 600 + D PO) Take 2 tablets by mouth 2 (two) times daily.       . cetirizine (ZYRTEC) 10 MG tablet Take 10 mg by  mouth daily.        Marland Kitchen darifenacin (ENABLEX) 7.5 MG 24 hr tablet Take 7.5 mg by mouth daily.      . divalproex (DEPAKOTE ER) 250 MG 24 hr tablet Take 250 mg by mouth at bedtime.       . divalproex (DEPAKOTE) 500 MG 24 hr tablet Take 1,000 mg by mouth every evening.       . folic acid (FOLVITE) 1 MG tablet Take 1 mg by mouth daily.        . furosemide (LASIX) 40 MG tablet Take 40 mg by mouth daily.        Marland Kitchen gabapentin (NEURONTIN) 300 MG capsule Take 900 mg by mouth at bedtime.       Marland Kitchen ipratropium (ATROVENT) 0.02 % nebulizer solution Take 500 mcg by nebulization 3 (three) times daily. Shortness of breath      . methotrexate (RHEUMATREX) 2.5 MG tablet Take 15 mg by mouth once a week. Caution:Chemotherapy. Protect from light.patient take on Sunday      . metoprolol succinate (TOPROL-XL) 25 MG 24 hr tablet Take 12.5 mg by mouth every evening.       . Olopatadine HCl (PATADAY) 0.2 % SOLN Apply 1 drop to eye 2 (two) times daily. Patient use in left eye      . omeprazole (PRILOSEC) 20 MG capsule Take  1 capsule (20 mg total) by mouth 2 (two) times daily.  60 capsule  11  . oxyCODONE-acetaminophen (PERCOCET/ROXICET) 5-325 MG per tablet Take 1 tablet by mouth every 6 (six) hours as needed. Take 1-2 tablets q6hr prn      . PENTASA 250 MG CR capsule TAKE 4 CAPSULES BY MOUTH 3 TIMES DAILY.  360 each  5  . pravastatin (PRAVACHOL) 20 MG tablet Take 20 mg by mouth at bedtime.       . risperiDONE (RISPERDAL) 3 MG tablet Take 9 mg by mouth at bedtime.      Marland Kitchen rOPINIRole (REQUIP) 2 MG tablet Take 2 mg by mouth at bedtime.        . sertraline (ZOLOFT) 50 MG tablet Take 50 mg by mouth daily.      . Tamsulosin HCl (FLOMAX) 0.4 MG CAPS Take 0.4 mg by mouth daily.        . traZODone (DESYREL) 50 MG tablet Take 50 mg by mouth at bedtime as needed.         Allergies as of 05/15/2012 - Review Complete 05/15/2012  Allergen Reaction Noted  . Aspirin    . Codeine    . Ibuprofen    . Penicillins    . Tramadol hcl    .  Vicodin (hydrocodone-acetaminophen) Nausea Only 05/17/2011    ROS:  General: Negative for anorexia, weight loss, fever, chills, fatigue, weakness. ENT: Negative for hoarseness, difficulty swallowing , nasal congestion. CV: Negative for chest pain, angina, palpitations, dyspnea on exertion, peripheral edema.  Respiratory: Negative for dyspnea at rest, dyspnea on exertion, cough, sputum, wheezing.  GI: See history of present illness. GU:  Negative for dysuria, hematuria, urinary incontinence, urinary frequency, nocturnal urination.  Endo: Negative for unusual weight change.    Physical Examination:   BP 120/80  Pulse 72  Temp 97.1 F (36.2 C) (Temporal)  Ht 5' 6"  (1.676 m)  Wt 195 lb 6.4 oz (88.633 kg)  BMI 31.54 kg/m2  General: Well-nourished, well-developed in no acute distress. Accompanied by caregiver from group home.  Eyes: No icterus. Mouth: Oropharyngeal mucosa moist and pink , no lesions erythema or exudate. Lungs: Clear to auscultation bilaterally.  Heart: Regular rate and rhythm, no murmurs rubs or gallops.  Abdomen: Bowel sounds are normal, nontender, nondistended, no hepatosplenomegaly or masses, no abdominal bruits or hernia , no rebound or guarding.   Extremities: No lower extremity edema. No clubbing or deformities. Neuro: Alert and oriented x 4   Skin: Warm and dry, no jaundice.   Psych: Alert and cooperative, normal mood and affect.  Labs:  Lab Results  Component Value Date   WBC 6.7 04/11/2012   HGB 16.0 04/11/2012   HCT 47.4 04/11/2012   MCV 89.6 04/11/2012   PLT 176 04/11/2012   Lab Results  Component Value Date   ALT 35 04/11/2012   AST 26 04/11/2012   ALKPHOS 78 04/11/2012   BILITOT 0.9 04/11/2012   Lab Results  Component Value Date   CREATININE 1.14 04/11/2012   BUN 9 04/11/2012   NA 140 04/11/2012   K 4.0 04/11/2012   CL 99 04/11/2012   CO2 30 04/11/2012   Lab Results  Component Value Date   LIPASE 25 04/11/2012    Imaging Studies: No results found.

## 2012-05-15 NOTE — Assessment & Plan Note (Signed)
Overall, doing better. Less abdominal pain. Pain only around stoma at this point. Still with intermittent days of increased stools but for most part, doing better. No obvious blood in stool.   Need current med list. Group home brought in [facility] from 08/2011. We will request current MAR and call [facility] as well. Stress importance that they bring current list every office visit to all providers.   To discuss further with Dr. Oneida Alar upon her return next week. I don't feel strongly that he is having active flare in need of prednisone at this time.

## 2012-05-15 NOTE — Patient Instructions (Addendum)
We will request copy of your current medication list. I will discuss your case with Dr. Oneida Alar next week. At this time I do not recommend prednisone/steroids. Keep appointment with Dr. Morton Stall in 07/2012.

## 2012-05-15 NOTE — Assessment & Plan Note (Signed)
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 He will keep f/u OV with Dr. Morton Stall. Dr. Morton Stall should have access to CT reports and our office notes as they are viewable under scanned media on [facility]. We will send today's correspondence as well.  Encourage smoking cessation. Recommend he drop one cigarette per every 3 days until off. Encouraged placing daily allotment of cigarettes in separate container such as ziplock bag to keep track of usage.

## 2012-05-15 NOTE — Telephone Encounter (Signed)
Called and spoke with [facility]. Scheduled pt an OV this AM with Neil Crouch, PA  At 11:30.  She had earlier appt at Dr. Brooke Bonito and needed AM appt.

## 2012-05-15 NOTE — Progress Notes (Signed)
LATE ENTRY: CALLED [facility] AND [facility] AT APPROXIMATELY 12:25 PM AND ASK FOR CURRENT MAR.

## 2012-05-16 ENCOUNTER — Telehealth: Payer: Self-pay

## 2012-05-16 NOTE — Telephone Encounter (Signed)
I called [name] again and requested the current medication list. She said she is out of town but she will stop and fax it since the facility doesn't have a means of faxing. I called [facility]. They are not open yet, I will call again when they open.

## 2012-05-16 NOTE — Telephone Encounter (Signed)
Called Anna at [facility]. She said they faxed it yesterday. But today their fax is down. She is going to walk the list down.

## 2012-05-16 NOTE — Progress Notes (Signed)
Faxed to Dr Morton Stall and Dr Legrand Rams, PCP

## 2012-05-16 NOTE — Progress Notes (Signed)
Updated medication list as per [facility]. LSL

## 2012-05-29 NOTE — Progress Notes (Signed)
AGREE WITH SMOKING CESSATION. MONITOR SX. PT MAY NEED MORE AGGRESSIVE IBD REGIMEN. OPV OCT 2013 E 30 SMALL BOWEL CROHN'S.

## 2012-05-30 NOTE — Progress Notes (Signed)
Please make OV with [facility] only. E30 for small bowel Crohn's.

## 2012-05-30 NOTE — Progress Notes (Signed)
Reminder in epic to follow up with [facility] in [place] in E30 for small bowel crohns

## 2012-06-09 ENCOUNTER — Ambulatory Visit (HOSPITAL_COMMUNITY)
Admission: RE | Admit: 2012-06-09 | Discharge: 2012-06-09 | Disposition: A | Discharge status: Home / Self Care | Payer: Medicaid Other | Source: Ambulatory Visit | Attending: Internal Medicine | Admitting: Internal Medicine

## 2012-06-09 ENCOUNTER — Other Ambulatory Visit (HOSPITAL_COMMUNITY): Payer: Self-pay | Admitting: Internal Medicine

## 2012-06-09 DIAGNOSIS — M25562 Pain in left knee: Secondary | ICD-10-CM

## 2012-06-09 DIAGNOSIS — M25569 Pain in unspecified knee: Secondary | ICD-10-CM | POA: Insufficient documentation

## 2012-07-05 ENCOUNTER — Telehealth: Payer: Self-pay | Admitting: *Deleted

## 2012-07-05 NOTE — Telephone Encounter (Signed)
Routing to Neil Crouch, [place], who saw pt last.

## 2012-07-05 NOTE — Telephone Encounter (Signed)
Please address with Dr. Oneida Alar. Looks like she put him on vegetarian diet on 03/2012 OV. Not sure the indication.

## 2012-07-05 NOTE — Telephone Encounter (Signed)
Ms Huel Cote called today on behalf of [name]. He would like to know if he can go back to eating meat. Please follow up. Thanks.

## 2012-07-05 NOTE — Telephone Encounter (Signed)
Routing to Dr. Oneida Alar per Neil Crouch, PA.

## 2012-07-06 NOTE — Telephone Encounter (Signed)
[place] and informed her. She requested the info faxed and I faxed her the order to [phone].

## 2012-07-06 NOTE — Telephone Encounter (Signed)
PLEASE CALL PT'S FACILITY. HE MAY RESUME A REGULAR DIET.

## 2012-07-26 ENCOUNTER — Ambulatory Visit (INDEPENDENT_AMBULATORY_CARE_PROVIDER_SITE_OTHER): Payer: Medicaid Other | Admitting: Gastroenterology

## 2012-07-26 ENCOUNTER — Encounter: Payer: Self-pay | Admitting: Gastroenterology

## 2012-07-26 VITALS — BP 105/65 | HR 82 | Temp 98.5°F | Ht 64.0 in | Wt 205.8 lb

## 2012-07-26 DIAGNOSIS — K5000 Crohn's disease of small intestine without complications: Secondary | ICD-10-CM

## 2012-07-26 MED ORDER — PREDNISONE 10 MG PO TABS: ORAL_TABLET | ORAL | Status: DC

## 2012-07-26 NOTE — Progress Notes (Signed)
Faxed to PCP

## 2012-07-26 NOTE — Progress Notes (Signed)
REFER TO DERMATOLOGY TO DETERMINE NEED FOR METHOTREXATE SI NEED TO CONTROL PSORIASIS IN ADDITION TO REMICADE.

## 2012-07-26 NOTE — Assessment & Plan Note (Addendum)
MODERATELY ACTIVE DISEASE AND UNCONTROLLED ON PENTASA.  DISCUSSED TREATMENT OPTION S WITH PT AND BENEFITS V. RISKS OF IMURAN/REMICADE.   GET HEP B sAB/Ag TODAY.  STOP TAKING PENTASA.  GET PPD RESULTS.  START REMICADE NEXT WEEK.  REFER TO DERMATOLOGY TO DETERMINE NEED FOR MTX IN ADDITION TO REMICADE.  START PREDNISONE AND TAPER OVER 4 WEEKS.  FOLLOW UP IN 8 WEEK.

## 2012-07-26 NOTE — Progress Notes (Signed)
Subjective:    Patient ID: [name], male    DOB: 1965/10/18, 46 y.o.   MRN: [id number]  PCP: FANTA  HPI OTHER PCP RETIRED. DOING WELL SINCE LAST VISIT UNTIL LAST WEEK. PAIN IN AROUND STOMA AND STOMACH. BLOOD AND BLACK STOOL IN BAG. DIARRHEA IN BAG-2-3 TIMES A DAY. LAST STEROIDS: AUG 2013. TB SKIN TEST x2 IN NOV DEC 2012. NEVER HAD HEPATITIS B. HAS A NEW GIRLFRIEND. HAD ONE DOSE OF REMICADE IN THE REMOTE PAST (~10 YEARS). BEEN ON PREDNISONE OFF AND ON SEVERAL TIMES IN 2013.   Past Medical History  Diagnosis Date  . Crohn's colitis     s/p total colectomy with ileostomy in 2009  . Nystagmus   . Bipolar disorder   . Asthma   . GERD (gastroesophageal reflux disease)   . Hyperlipidemia   . Insomnia   . Enterococcus UTI 2009  . 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 DVT (deep venous thrombosis) 2009    right upper extremity due to PICC  . S/P endoscopy Sept 2012    mild gastritis, small hiatal hernia, no ulcers  . Crohn's disease of small intestine Sept 2012    ileoscopy: multiple ulcers likely secondary to Crohn's  . Schizophrenia, acute   . Migraine headache   . Hernia   . Colostomy in place     Past Surgical History  Procedure Date  . Total colectomy 2009    with ileostomy at [facility] for refractory disease   . Total shoulder replacement     bilateral  . Total hip arthroplasty     right, due to avascular necrosis from chronic prednisone  . Kidney surgery   . Appendectomy   . Eye surgery   . Esophagogastroduodenoscopy 07/2010    gastritis,  bx neg for H.Pylori  . Small bowel capsule study 08/2010    few tiny nonbleeding erosions/ulcers ?secondary to relafen or Crohn's. Entire small bowel not seen.   . Ileoscopy 06/29/2011    Procedure: ILEOSCOPY THROUGH STOMA;  Surgeon: Dorothyann Peng, MD;  Location: [facility];  Service: Endoscopy;  Laterality: N/A;  9:45    Allergies  Allergen Reactions  . Aspirin    REACTION: unknown reaction  . Codeine     REACTION: unknown reaction  . Ibuprofen     REACTION: unknown reaction  . Penicillins     REACTION: unknown reaction  . Tramadol Hcl     REACTION: unknown reaction  . Vicodin (Hydrocodone-Acetaminophen) Nausea Only    Current Outpatient Prescriptions  Medication Sig Dispense Refill  . acetaminophen (TYLENOL) 500 MG tablet Take 1,000 mg by mouth every 6 (six) hours as needed. For pain       . albuterol (VENTOLIN HFA) 108 (90 BASE) MCG/ACT inhaler Inhale 1-2 puffs into the lungs every 4 (four) hours as needed. For wheezing and/or difficulty with breathing      . benztropine (COGENTIN) 0.5 MG tablet Take 0.5 mg by mouth daily.       . busPIRone (BUSPAR) 5 MG tablet Take 5 mg by mouth 3 (three) times daily as needed. For anxiety      . Calcium Carbonate-Vitamin D (CALCIUM 600 + D PO) Take 2 tablets by mouth 2 (two) times daily.       . cetirizine (ZYRTEC) 10 MG tablet Take 10 mg by mouth daily.        . divalproex (DEPAKOTE ER) 250 MG 24 hr  tablet Take 250 mg by mouth at bedtime.       . divalproex (DEPAKOTE) 500 MG 24 hr tablet Take 1,000 mg by mouth every evening.       . folic acid (FOLVITE) 1 MG tablet Take 1 mg by mouth daily.        . furosemide (LASIX) 40 MG tablet Take 40 mg by mouth daily.        Marland Kitchen gabapentin (NEURONTIN) 300 MG capsule Take 900 mg by mouth at bedtime.       Marland Kitchen ipratropium (ATROVENT) 0.02 % nebulizer solution Take 500 mcg by nebulization 3 (three) times daily. Shortness of breath      . methotrexate (RHEUMATREX) 2.5 MG tablet Take 15 mg by mouth once a week. Caution:Chemotherapy. Protect from light.patient take on Sunday      . metoprolol succinate (TOPROL-XL) 25 MG 24 hr tablet Take 12.5 mg by mouth every evening.       . Olopatadine HCl (PATADAY) 0.2 % SOLN Apply 1 drop to eye 2 (two) times daily. Patient use in left eye      . omeprazole (PRILOSEC) 20 MG capsule Take 1 capsule (20 mg total) by mouth 2 (two) times daily.      Marland Kitchen oxyCODONE-acetaminophen (PERCOCET/ROXICET) 5-325 MG per tablet Take 1 tablet by mouth every 6 (six) hours as needed. Take 1-2 tablets q6hr prn    . PENTASA 250 MG CR capsule TAKE 4 CAPSULES BY MOUTH 3 TIMES DAILY.    . pravastatin (PRAVACHOL) 20 MG tablet Take 20 mg by mouth at bedtime.       . risperiDONE (RISPERDAL) 3 MG tablet Take 9 mg by mouth at bedtime.      Marland Kitchen rOPINIRole (REQUIP) 2 MG tablet Take 2 mg by mouth at bedtime.        . sertraline (ZOLOFT) 50 MG tablet Take 50 mg by mouth daily.      . Tamsulosin HCl (FLOMAX) 0.4 MG CAPS Take 0.4 mg by mouth daily.        . traZODone (DESYREL) 50 MG tablet Take 50 mg by mouth at bedtime as needed.       . darifenacin (ENABLEX) 7.5 MG 24 hr tablet Take 7.5 mg by mouth daily.          Review of Systems     Objective:   Physical Exam  Vitals reviewed. Constitutional: He is oriented to person, place, and time. He appears well-developed. No distress.  HENT:  Head: Normocephalic and atraumatic.  Mouth/Throat: Oropharynx is clear and moist. No oropharyngeal exudate.  Eyes: Pupils are equal, round, and reactive to light. No scleral icterus.  Neck: Normal range of motion. Neck supple.  Cardiovascular: Normal rate, regular rhythm and normal heart sounds.   Pulmonary/Chest: Effort normal and breath sounds normal. No respiratory distress.  Abdominal: Soft. Bowel sounds are normal. He exhibits no distension. There is tenderness. There is no rebound and no guarding.       MODERATE TTP IN RUQ/RLQ, MILD TTP IN LUQ/LLQ  Musculoskeletal: Normal range of motion. He exhibits edema.  Lymphadenopathy:    He has no cervical adenopathy.  Neurological: He is alert and oriented to person, place, and time.       NO  NEW FOCAL DEFICITS   Psychiatric: He has a normal mood and affect.          Assessment & Plan:

## 2012-07-26 NOTE — Patient Instructions (Addendum)
GET BLOOD DRAWN TODAY.  STOP TAKING PENTASA.  GET PPD RESULTS FAXED TO ME.  START REMICADE NEXT WEEK.  START PREDNISONE AND TAPER OVER 4 WEEKS.  FOLLOW UP IN 8 WEEK.

## 2012-08-01 ENCOUNTER — Telehealth: Payer: Self-pay

## 2012-08-01 NOTE — Telephone Encounter (Signed)
T/C from [place] at [facility], ( Her contact number is [phone]  Ext [phone] ).  They are requesting the most recent of the following:  1. CBC with Diff, LFT's. BMP  2.  PPD documented results  3. Orders for premedicating for Tylenol and Benadryl  4. Does pt have perifferal  IV access?

## 2012-08-02 NOTE — Telephone Encounter (Signed)
[facility] FAXED.

## 2012-08-03 ENCOUNTER — Telehealth: Payer: Self-pay

## 2012-08-03 NOTE — Telephone Encounter (Signed)
Roselyn Reef from [facility] called and requested recent labs on pt and documentation of PPD. Could not find documentation of results of PPD but note in last OV on 07/26/2012 said pt had TB skin test x 2 in Nov and Dec of 2012. But no documentation of results. I faxed what I had to [phone]. ( Tried to call the facility the pt is at and no answer).

## 2012-08-04 NOTE — Progress Notes (Signed)
Patient ID: [name], male   DOB: 1966/01/26, 46 y.o.   MRN: [id number] I called Mrs. Rucker and asked her to fax over the PPD results for me so I could fax it to the home health nurse. I have not seen anything yet.

## 2012-08-07 LAB — HEPATITIS B SURFACE ANTIBODY,QUALITATIVE: Hep B S Ab: NEGATIVE

## 2012-08-08 NOTE — Progress Notes (Signed)
HEP B sAb AND HEP B sAg. Spoke with [name]. PT HAD NEG PPD IN 2012. [name] WILL FAX RESULTS TO [phone].

## 2012-08-08 NOTE — Telephone Encounter (Signed)
[name] brought by the documentation for the PPD results today and I faxed to [facility].

## 2012-08-09 NOTE — Progress Notes (Signed)
[name] from the [place] facility brought by the results of PPD and I faxed it to [facility] at [facility] and will have it scanned in.

## 2012-08-10 ENCOUNTER — Telehealth: Payer: Self-pay

## 2012-08-10 NOTE — Telephone Encounter (Signed)
Called, left the message on VM and told to call if questions.

## 2012-08-10 NOTE — Telephone Encounter (Signed)
T/C from [place] at [facility]. She said they originally had pt's next treatment scheduled for 08/18/2012. But pt afterwards told his nurse that he has a doctor's appt on the 15th and they are not sure they can get them both in. She is asking Dr. Oneida Alar if it is OK to reschedule it to [facility] 08/20/2012??Please advise!

## 2012-08-10 NOTE — Telephone Encounter (Signed)
PLEASE CALL JAMIE. OK TO Herman TO 11/17.

## 2012-08-17 NOTE — Progress Notes (Signed)
Pt is aware of OV on 12/4 at 10 with SF in E30

## 2012-08-22 ENCOUNTER — Telehealth: Payer: Self-pay

## 2012-08-22 NOTE — Telephone Encounter (Signed)
T/C from home health nurse, Lorelee New. Said she started pt's 2nd Remicade treatment and got about 30 min into it ( 4.8 ml )and he had a reaction, started wheezing and some difficulty breathing. She stopped the treatment and gave Benadryl IM and started plain fluids and he is doing fine, resting. She was asking advise. I paged Dr. Oneida Alar, she called and I informed her and gave her the phone number of [phone] to call nurse. She said she would call.

## 2012-08-23 NOTE — Telephone Encounter (Signed)
[facility] by Burnadette Peter, LPN.

## 2012-08-23 NOTE — Telephone Encounter (Addendum)
CALLED AND SPOKE WITH MS. Ronnald Ramp 11/19. PT SX RESOLVED AFTER BENADRYL. REACTION STARTED WITHIN 5 ML OF INFUSION. PT TOLERATED THE FIRST INFUSION.  PT NEEDS NEXT DOSE OF REMICADE NEXT WEEK IN [facility]. APPROVED BY TRAC[facility]. NEEDS SOLUMEDROL 40 MG IV, BENADRYL 25 MG IV, AND ZYRTEC 10 MG PO 1 HOUR PRIOR TO HIS INFUSION., AND IF HE TOLERATES DOSE WITH PREMEDS X2 THEN WOULD CONSIDER OP INFUSION. DISCUSSED PRE-MEDS WITH PHARMACY. FAXED ORDER TO [facility].   500 MG DOSE WILL INFUSE OVER 4 HOURS. NEED DOSE @11 /25 AND 12/25 THEN Q8 WEEKS.

## 2012-08-23 NOTE — Telephone Encounter (Signed)
CALLED PT. FEELING BETTER TODAY. NO SX WHILE TAKING HIGHER DOSE OF STEROIDS AT THE SAME TIME AS HIS INFUSION. DISCUSSED PLAN WITH HIM AND MS. RUCKER.

## 2012-08-23 NOTE — Telephone Encounter (Signed)
TRIED TO CONTACT PT TODAY TO FOLLOW UP. NO VM.

## 2012-08-23 NOTE — Telephone Encounter (Signed)
Received fax from [place] for Dr. Oneida Alar. On her desk, she is in office today.

## 2012-08-28 ENCOUNTER — Encounter (HOSPITAL_COMMUNITY): Payer: Medicaid Other | Attending: Gastroenterology

## 2012-08-28 ENCOUNTER — Telehealth: Payer: Self-pay

## 2012-08-28 VITALS — BP 121/77 | HR 78 | Temp 97.9°F | Resp 20 | Ht 66.0 in | Wt 200.0 lb

## 2012-08-28 DIAGNOSIS — T7840XA Allergy, unspecified, initial encounter: Secondary | ICD-10-CM

## 2012-08-28 DIAGNOSIS — T50995A Adverse effect of other drugs, medicaments and biological substances, initial encounter: Secondary | ICD-10-CM | POA: Insufficient documentation

## 2012-08-28 DIAGNOSIS — Z888 Allergy status to other drugs, medicaments and biological substances status: Secondary | ICD-10-CM | POA: Insufficient documentation

## 2012-08-28 DIAGNOSIS — K5010 Crohn's disease of large intestine without complications: Secondary | ICD-10-CM | POA: Insufficient documentation

## 2012-08-28 MED ORDER — SODIUM CHLORIDE 0.9 % IV SOLN
INTRAVENOUS | Status: DC
  Administered 2012-08-28: 12:00:00 via INTRAVENOUS

## 2012-08-28 MED ORDER — DIPHENHYDRAMINE HCL 50 MG/ML IJ SOLN
25.0000 mg | Freq: Once | INTRAMUSCULAR | Status: AC
  Administered 2012-08-28: 25 mg via INTRAVENOUS

## 2012-08-28 MED ORDER — METHYLPREDNISOLONE SODIUM SUCC 40 MG IJ SOLR
40.0000 mg | INTRAMUSCULAR | Status: AC
  Administered 2012-08-28: 40 mg via INTRAVENOUS

## 2012-08-28 MED ORDER — SODIUM CHLORIDE 0.9 % IJ SOLN: 10.0000 mL | INTRAMUSCULAR | Status: DC | PRN

## 2012-08-28 MED ORDER — METHYLPREDNISOLONE SODIUM SUCC 40 MG IJ SOLR
40.0000 mg | Freq: Once | INTRAMUSCULAR | Status: AC
  Administered 2012-08-28: 40 mg via INTRAVENOUS

## 2012-08-28 MED ORDER — LORATADINE 10 MG PO TABS
10.0000 mg | ORAL_TABLET | Freq: Every day | ORAL | Status: DC
  Administered 2012-08-28: 10 mg via ORAL

## 2012-08-28 MED ORDER — SODIUM CHLORIDE 0.9 % IV SOLN
500.0000 mg | Freq: Once | INTRAVENOUS | Status: AC
  Administered 2012-08-28: 500 mg via INTRAVENOUS
  Filled 2012-08-28: qty 50

## 2012-08-28 NOTE — Progress Notes (Signed)
Remicade infusion started at 1240 at slow titration rate a follows: 10 ml/hr, increase rate by doubling rate every 15 mins. After 1 hour, if pt tolerating well, increase rate to 150 ml/hr x 30 mins, then 250 ml/hr until infusion complete. At end of 30 mins, nurse was increasing rate 40 ml/hr when pt began coughing and wheezing and stated "I can't breathe". Infusion stopped. Oxygen NRB mask given to pt,VSS. Made several attempts to reach Dr.Fields at number included in orders. Line remained busy. 2nd nurse had Dr.Fields paged and I called another number to her office. Office informed me Dr.Fields was off today and Dr.Rehman was covering. Paged Dr.Rehman. Informed him of above. He consulted with Dr.Rourk. Orders received to discontinue medication, give another dose Solumedrol 40 mg IV push. Pt to remain in clinic x 1 hour. If pt is ok he may return home but if he does not improve he is to go to the ER for evaluation. Pt informed of this and was also informed that he will receive a call from Dr.Fields office and they will see him at an appointment next week. 1500 pt states he feels fine and he is going home with caregiver.

## 2012-08-28 NOTE — Telephone Encounter (Signed)
Per Vickey Huger, NP, Dr. Gala Romney called and said HOLD REMICADE for now. Needs OV with Dr. Oneida Alar prior to continuing. He had another reaction even though he was premedicated.

## 2012-08-29 NOTE — Telephone Encounter (Signed)
Pt is aware of OV on 12/4 at 10 with SF

## 2012-08-29 NOTE — Telephone Encounter (Signed)
REVIEWED.  OPV DEC 4

## 2012-09-05 ENCOUNTER — Telehealth: Payer: Self-pay

## 2012-09-05 NOTE — Telephone Encounter (Signed)
I was told today by office staff that pt has moved from [facility] and they were not able to contact him about his appt with Dr. Oneida Alar. i called Rise Paganini and she said he moved somewhere in [place] on [address] ( [address]). I tried to call his old number [phone] and no answe. ( His appt is tomorrow with Dr. Oneida Alar at 10:00 AM).

## 2012-09-06 ENCOUNTER — Ambulatory Visit: Payer: Medicaid Other | Admitting: Gastroenterology

## 2012-09-06 NOTE — Telephone Encounter (Signed)
[name] called and said he could not get a ride today, and would be unable to come until Jan. He scheduled appt for 10/12/2012 at 9:30 AM with Dr. Oneida Alar. He said he is doing fine right now. He gave [place] his new cell number which has been added to his chart. [phone] ).

## 2012-09-06 NOTE — Telephone Encounter (Signed)
Routing  FYI to Dr. Oneida Alar.

## 2012-09-06 NOTE — Telephone Encounter (Signed)
I called [name]'s old cell number this AM, [phone], and it was not working. I called [facility] in [place] at [phone] and [facility] that it was very important for pt to call, and he has an appt today at 10:00 AM.

## 2012-09-06 NOTE — Telephone Encounter (Signed)
REVIEWED.  

## 2012-09-12 ENCOUNTER — Telehealth: Payer: Self-pay

## 2012-09-12 NOTE — Telephone Encounter (Signed)
T/C from [place] with [facility]. She asked how pt's Remicade treatments were going at the specialty clinic. I told her he had problems there also and is supposed to see Dr. Oneida Alar again before further orders. He has moved and can't come in til mid Jan. She said she will just discharge for now and we can just call if we need them.

## 2012-09-15 NOTE — Telephone Encounter (Signed)
PT HAD IMMUNE REACTION TO REMICADE. NO LONGER WILL RECEIVE AS AN OP.

## 2012-09-25 ENCOUNTER — Ambulatory Visit (HOSPITAL_COMMUNITY): Payer: Medicaid Other

## 2012-10-12 ENCOUNTER — Ambulatory Visit: Payer: Medicaid Other | Admitting: Gastroenterology

## 2012-10-17 ENCOUNTER — Encounter: Payer: Self-pay | Admitting: Gastroenterology

## 2012-10-18 ENCOUNTER — Encounter: Payer: Self-pay | Admitting: Gastroenterology

## 2012-10-18 ENCOUNTER — Ambulatory Visit (INDEPENDENT_AMBULATORY_CARE_PROVIDER_SITE_OTHER): Payer: Medicaid Other | Admitting: Gastroenterology

## 2012-10-18 VITALS — BP 118/65 | HR 69 | Temp 98.2°F | Ht 66.0 in | Wt 212.8 lb

## 2012-10-18 DIAGNOSIS — K5000 Crohn's disease of small intestine without complications: Secondary | ICD-10-CM

## 2012-10-18 DIAGNOSIS — K5010 Crohn's disease of large intestine without complications: Secondary | ICD-10-CM

## 2012-10-18 NOTE — Assessment & Plan Note (Signed)
PT HAD TOTAL COLECTOMY WITH ILEOSTOMY.

## 2012-10-18 NOTE — Patient Instructions (Addendum)
GET LABS DRAWN NEXT WED. YOU WILL NEED LABS DRAWN ON FEB 5,  & MAR 5 & THEN EVERY 3 MOS FOR ONE YEAR.  WILL CALL IN PRESCRIPTION FOR IMURAN AFTER YOUR LABS ARE COMPLETE. SEE INFORMATION ABOUT IMURAN BELOW.   IMURAN MAY CAUSE NAUSEA, VOMITING, OR ABDOMINAL PAIN. IT CAN CAUSE PANCREATITIS OR IRRITATE YOUR LIVER.  CALL [phone] TO SEE IF YOU QUALIFY FOR AN OBAMA PHONE.  FOLLOW UP IN FEB 26. CALL WITH QUESTIONS OR PROBLEMS..   Azathioprine tablets What is this medicine? AZATHIOPRINE (ay za THYE oh preen) suppresses the immune system. It is used to prevent organ rejection after a transplant. It is also used to treat rheumatoid arthritis. This medicine may be used for other purposes; ask your health care provider or pharmacist if you have questions.  How should I use this medicine? Take this medicine by mouth with a full glass of water. Follow the directions on the prescription label. Take your medicine at regular intervals. Do not take your medicine more often than directed. Continue to take your medicine even if you feel better. Do not stop taking except on your doctor's advice. Talk to your pediatrician regarding the use of this medicine in children. Special care may be needed. Overdosage: If you think you have taken too much of this medicine contact a poison control center or emergency room at once. NOTE: This medicine is only for you. Do not share this medicine with others.  What may interact with this medicine? Do not take this medicine with any of the following medications: -mercaptopurine This medicine may also interact with the following medications: -allopurinol -aminosalicylates like sulfasalazine, mesalamine, balsalazide, and olsalazine -leflunomide -medicines called ACE inhibitors like benazepril, captopril, enalapril, fosinopril, quinapril, lisinopril, ramipril, and trandolapril -mycophenolate -sulfamethoxazole; trimethoprim -vaccines -warfarin This list may not describe  all possible interactions. Give your health care provider a list of all the medicines, herbs, non-prescription drugs, or dietary supplements you use. Also tell them if you smoke, drink alcohol, or use illegal drugs. Some items may interact with your medicine.  What should I watch for while using this medicine? Visit your doctor or health care professional for regular checks on your progress. You will need frequent blood checks during the first few months you are receiving the medicine. If you get a cold or other infection while receiving this medicine, call your doctor or health care professional. Do not treat yourself. The medicine may increase your risk of getting an infection. Women should inform their doctor if they wish to become pregnant or think they might be pregnant. There is a potential for serious side effects to an unborn child. Talk to your health care professional or pharmacist for more information. Men may have a reduced sperm count while they are taking this medicine. Talk to your health care professional for more information. This medicine may increase your risk of getting certain kinds of cancer. Talk to your doctor about healthy lifestyle choices, important screenings, and your risk.  What side effects may I notice from receiving this medicine? Side effects that you should report to your doctor or health care professional as soon as possible: -allergic reactions like skin rash, itching or hives, swelling of the face, lips, or tongue -fever, chills, or any other sign of infection -severe stomach pain -unusual bleeding, bruising -unusually weak or tired -vomiting -yellowing of the eyes or skin Side effects that usually do not require medical attention (report to your doctor or health care professional if they continue  or are bothersome): -hair loss -nausea This list may not describe all possible side effects. Call your doctor for medical advice about side effects. You may report  side effects to FDA at 1-800-FDA-1088.  Where should I keep my medicine? Keep out of the reach of children. Store at room temperature between 15 and 25 degrees C (59 and 77 degrees F). Protect from light. Throw away any unused medicine after the expiration date. NOTE: This sheet is a summary. It may not cover all possible information. If you have questions about this medicine, talk to your doctor, pharmacist, or health care provider.  2013, Elsevier/Gold Standard. (12/13/2007 10:28:43 AM)

## 2012-10-18 NOTE — Assessment & Plan Note (Addendum)
GET LABS DRAWN NEXT WED. YOU WILL NEED LABS DRAWN ON FEB 5,  & MAR 5 & THEN EVERY 3 MOS FOR ONE YEAR. UNABLE TO TOLERATE REMICADE. PT UNABLE TO PROCEED WITH A MORE EXPEDITED MANAGEMENT PLAN DUE TO INABILITY TO TRANSPORT HIMSELF MORE THAN ONCE A WEEK.  WILL CALL IN PRESCRIPTION FOR IMURAN AFTER YOUR LABS ARE COMPLETE. SEE INFORMATION ABOUT IMURAN BELOW.   IMURAN MAY CAUSE NAUSEA, VOMITING, OR ABDOMINAL PAIN. IT CAN CAUSE PANCREATITIS OR IHEPATITIS.  CALL [phone] TO SEE IF YOU QUALIFY FOR AN OBAMA PHONE.  FOLLOW UP FEB 26.  CONSIDER APPT AT [facility] TO ASSIST WITH MANAGING CROHN'S DISEASE.  CALL WITH QUESTIONS OR PROBLEMS.

## 2012-10-18 NOTE — Progress Notes (Addendum)
Subjective:    Patient ID: [name], male    DOB: 19-Mar-1966, 47 y.o.   MRN: [id number]  PCP: Legrand Rams  HPI Up 7 lbs since oct 2013. Now living on his own. Got a new girlfriend. Her name is Peter Congo. LEFT RUCKERS BECAUSE HE DIDN'T WANT HER TO GET FIRED. Planning a romantic dinner. When he eats, his abd cramps. ChanGing bag: emptying 15 times a day. Yesterday saw blood 3 times: a lot. Sees black stools all the time. No CP/SOB. SUBJECTIVE CHILLS AND NAUSEA. NO FEVER. FEELS LIGHTHEADED AND DIZZY. NOT TAKING PENTASA. SEMI-FORMED STOOL IN BAG TODAY. IF DRINKS CAFFEINE HAS DIARRHEA. PT UNABLE TO HAVE BLOOD DRAWN UNTIL NEXT WED DUE TO TRANSPORTATION ISSUES.  Past Medical History  Diagnosis Date  . Crohn's colitis     s/p total colectomy with ileostomy in 2009  . Nystagmus   . Bipolar disorder   . Asthma   . GERD (gastroesophageal reflux disease)   . Hyperlipidemia   . Insomnia   . Enterococcus UTI 2009  . 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 DVT (deep venous thrombosis) 2009    right upper extremity due to PICC  . S/P endoscopy Sept 2012    mild gastritis, small hiatal hernia, no ulcers  . Crohn's disease of small intestine Sept 2012    ileoscopy: multiple ulcers likely secondary to Crohn's  . Schizophrenia, acute   . Migraine headache   . Hernia   . Colostomy in place     Past Surgical History  Procedure Date  . Total colectomy 2009    with ileostomy at [facility] for refractory disease   . Total shoulder replacement     bilateral  . Total hip arthroplasty     right, due to avascular necrosis from chronic prednisone  . Kidney surgery   . Appendectomy   . Eye surgery   . Esophagogastroduodenoscopy 07/2010    gastritis,  bx neg for H.Pylori  . Small bowel capsule study 08/2010    few tiny nonbleeding erosions/ulcers ?secondary to relafen or Crohn's. Entire small bowel not seen.   . Ileoscopy 06/29/2011    SLF: ulcers,multiple/small  HH/mild gastritis    Allergies  Allergen Reactions  . Aspirin     REACTION: unknown reaction  . Codeine     REACTION: unknown reaction  . Ibuprofen     REACTION: unknown reaction  . Penicillins     REACTION: unknown reaction  . Tramadol Hcl     REACTION: unknown reaction  . Vicodin (Hydrocodone-Acetaminophen) Nausea Only    Current Outpatient Prescriptions  Medication Sig Dispense Refill  . Calcium Carbonate-Vitamin D (CALCIUM 600 + D PO) Take 2 tablets by mouth 2 (two) times daily.       . cetirizine (ZYRTEC) 10 MG tablet Take 10 mg by mouth daily.        . divalproex (DEPAKOTE ER) 250 MG 24 hr tablet Take 250 mg by mouth at bedtime.       . divalproex (DEPAKOTE) 500 MG 24 hr tablet Take 1,000 mg by mouth every evening.       . folic acid (FOLVITE) 1 MG tablet Take 1 mg by mouth daily.        . furosemide (LASIX) 40 MG tablet Take 40 mg by mouth daily.        Marland Kitchen gabapentin (NEURONTIN) 300 MG capsule Take 900 mg by mouth at bedtime.       Marland Kitchen  metoprolol succinate (TOPROL-XL) 25 MG 24 hr tablet Take 12.5 mg by mouth every evening.       Marland Kitchen omeprazole (PRILOSEC) 20 MG capsule Take 1 capsule (20 mg total) by mouth 2 (two) times daily.    . potassium chloride SA (K-DUR,KLOR-CON) 20 MEQ tablet Take 20 mEq by mouth 2 (two) times daily.      . pravastatin (PRAVACHOL) 20 MG tablet Take 20 mg by mouth at bedtime.       Marland Kitchen rOPINIRole (REQUIP) 2 MG tablet Take 2 mg by mouth at bedtime.        . Tamsulosin HCl (FLOMAX) 0.4 MG CAPS Take 0.4 mg by mouth daily.        . traZODone (DESYREL) 50 MG tablet Take 50 mg by mouth at bedtime as needed.       Marland Kitchen acetaminophen (TYLENOL) 500 MG tablet Take 1,000 mg by mouth every 6 (six) hours as needed. For pain       . albuterol (VENTOLIN HFA) 108 (90 BASE) MCG/ACT inhaler Inhale 1-2 puffs into the lungs every 4 (four) hours as needed. For wheezing and/or difficulty with breathing      . benztropine (COGENTIN) 0.5 MG tablet Take 0.5 mg by mouth daily.       .  busPIRone (BUSPAR) 5 MG tablet Take 5 mg by mouth 3 (three) times daily as needed. For anxiety      .        . ipratropium (ATROVENT) 0.02 % nebulizer solution Take 500 mcg by nebulization 3 (three) times daily. Shortness of breath      .        .        .        .      .      . risperiDONE (RISPERDAL) 3 MG tablet Take 9 mg by mouth at bedtime.      . sertraline (ZOLOFT) 50 MG tablet Take 50 mg by mouth daily.          Review of Systems     Objective:   Physical Exam  Vitals reviewed. Constitutional: He is oriented to person, place, and time. He appears well-nourished. No distress.  HENT:  Head: Normocephalic and atraumatic.  Mouth/Throat: No oropharyngeal exudate.  Eyes: Pupils are equal, round, and reactive to light. No scleral icterus.  Neck: Normal range of motion. Neck supple.  Cardiovascular: Normal rate and regular rhythm.   Pulmonary/Chest: Effort normal and breath sounds normal. No respiratory distress.  Abdominal: Soft. Bowel sounds are normal. He exhibits no distension. There is tenderness.       MOD TTP IN ALL 4 QUADRANTS. MOD TTP WHEN PALPATING ILEOSTOMY SITE. NO ERYTHEMA OF SURROUNDING OR ULCERS APPRECIATED. SITE EXAM WITH ILEOSTOMY BAG IN PLACE.  Musculoskeletal: Normal range of motion. He exhibits no edema.  Neurological: He is alert and oriented to person, place, and time.       NO FOCAL DEFICITS   Skin:       NO PRETIBIAL LESIONS   Psychiatric: He has a normal mood and affect.          Assessment & Plan:

## 2012-10-18 NOTE — Progress Notes (Signed)
Faxed to PCP

## 2012-10-19 NOTE — Progress Notes (Signed)
Pt is aware of OV on 2/26 with SF at 0900 and appt card was mailed. Reminder in epic to have labs done on 12/06/12 and then every 3 months for one year

## 2012-10-23 ENCOUNTER — Other Ambulatory Visit: Payer: Self-pay

## 2012-10-23 MED ORDER — OMEPRAZOLE 20 MG PO CPDR: 20.0000 mg | DELAYED_RELEASE_CAPSULE | Freq: Two times a day (BID) | ORAL | Status: DC

## 2012-10-30 ENCOUNTER — Other Ambulatory Visit: Payer: Self-pay | Admitting: Gastroenterology

## 2012-10-30 LAB — CBC WITH DIFFERENTIAL/PLATELET
HCT: 47.3 % (ref 39.0–52.0)
Hemoglobin: 16.4 g/dL (ref 13.0–17.0)
Lymphocytes Relative: 21 % (ref 12–46)
Lymphs Abs: 1.6 10*3/uL (ref 0.7–4.0)
MCHC: 34.7 g/dL (ref 30.0–36.0)
Monocytes Absolute: 0.9 10*3/uL (ref 0.1–1.0)
Monocytes Relative: 12 % (ref 3–12)
Neutro Abs: 4.9 10*3/uL (ref 1.7–7.7)
RBC: 5.69 MIL/uL (ref 4.22–5.81)
WBC: 7.6 10*3/uL (ref 4.0–10.5)

## 2012-10-30 LAB — COMPREHENSIVE METABOLIC PANEL
AST: 49 U/L — ABNORMAL HIGH (ref 0–37)
BUN: 19 mg/dL (ref 6–23)
Calcium: 10 mg/dL (ref 8.4–10.5)
Chloride: 102 mEq/L (ref 96–112)
Creat: 1.05 mg/dL (ref 0.50–1.35)

## 2012-11-08 ENCOUNTER — Telehealth: Payer: Self-pay

## 2012-11-08 NOTE — Telephone Encounter (Signed)
Pt called and left VM requesting lab results. Rouging to Dr. Oneida Alar.

## 2012-11-08 NOTE — Progress Notes (Addendum)
CALLED PT. HAVING SEVERE DIARRHEA AND ABD CRAMPS. NO BLOOD.  DISCUSSED LAB RESULTS. PT DECIDING TO QUIT SMOKING. PT NEEDS HOME HEALTH TO PICKUP STOOL SAMPLE. DAD DIED IN [place], [place]. DIED OF HARDENING OF THE ARTERIES AND "DROWNED IN HIS OWN BLOOD". THAT'S WHY HE DECIDED TO QUIT SMOKING. [employer] IS TRYING TO PREVENT THE KIDS FROM GETTING ANY OF THEIR DAD'S ESTATE.   PT NEEDS [facility] TO COLLECT STOOL SAMPLE FOR A C DIFF PCR. FAX RESULTS TO [phone]. PT WILL NEED A CBC/HFP ON FEB 21, MAR 7, & MAR 21. FAX RESULTS TO [phone].  SEND MOST RECENT CBC/CMP TO PCP.

## 2012-11-09 ENCOUNTER — Other Ambulatory Visit: Payer: Self-pay

## 2012-11-09 ENCOUNTER — Telehealth: Payer: Self-pay

## 2012-11-09 DIAGNOSIS — K5090 Crohn's disease, unspecified, without complications: Secondary | ICD-10-CM

## 2012-11-09 DIAGNOSIS — R197 Diarrhea, unspecified: Secondary | ICD-10-CM

## 2012-11-09 NOTE — Progress Notes (Signed)
LMOM for pt to call back with the name of his nurse/ facility that I can fax C-Diff order to.

## 2012-11-09 NOTE — Progress Notes (Signed)
[place] [facility] at [phone] x [phone] and spoke to pt's nurse, Malachy Mood. She is aware he needs C-Diff PCR today and the other labs for future. All orders faxed to her at [phone].

## 2012-11-09 NOTE — Progress Notes (Signed)
Lab orders ready to be mailed for 2/21, 3/7, 3/21.

## 2012-11-09 NOTE — Telephone Encounter (Signed)
[place] at [facility]. Phone: [phone] Pt's nurse is [employer] # [phone]

## 2012-11-09 NOTE — Telephone Encounter (Signed)
SEE OPV

## 2012-11-09 NOTE — Telephone Encounter (Signed)
Pt aware.

## 2012-11-09 NOTE — Progress Notes (Signed)
Faxed to PCP

## 2012-11-13 ENCOUNTER — Telehealth: Payer: Self-pay

## 2012-11-13 NOTE — Telephone Encounter (Signed)
Received the results of the C-Diff and it was negative.

## 2012-11-14 MED ORDER — AZATHIOPRINE 100 MG PO TABS: 100.0000 mg | ORAL_TABLET | Freq: Every day | ORAL | Status: DC

## 2012-11-14 MED ORDER — PREDNISONE 10 MG PO TABS: 10.0000 mg | ORAL_TABLET | Freq: Every day | ORAL | Status: DC

## 2012-11-14 NOTE — Addendum Note (Signed)
Addended by: Danie Binder on: 11/14/2012 03:40 PM   Modules accepted: Orders

## 2012-11-14 NOTE — Telephone Encounter (Signed)
PLEASE CALL PT. HIS C DIFF TEST IS NEGATIVE.  HE NEEDS PREDNISONE 20 MG FOR 2 WEEKS THEN 10 MG FOR 2 WEEKS. HE SHOULD START IMURAN 100 MG DAILY. CALL IF HE DEVELOPS NAUSEA, VOMITING, DIARRHEA, ABDOMINAL PAIN OR JAUNDICE. RXs SENT.

## 2012-11-14 NOTE — Telephone Encounter (Signed)
Called pt and informed. He wanted me to call mental health and have his medications delivered to him. I called the number [phone] and spoke to the Dr. Oneida Alar there and informed her, she said they would see that they got delivered.

## 2012-11-15 ENCOUNTER — Telehealth: Payer: Self-pay

## 2012-11-15 NOTE — Telephone Encounter (Signed)
Rx sent 2/11. CALL MED TO PHARMACY.

## 2012-11-15 NOTE — Telephone Encounter (Signed)
I called pharmacy. The prescription was received and was packaged with his AM meds. I called pt and he said that he will not get his AM meds until next week. Dr. Oneida Alar is aware and said that is OK since he is on the prednisone. Pt aware.

## 2012-11-15 NOTE — Telephone Encounter (Signed)
Pt called and said he got the Prednisone, but the prescription for the Imuran was not sent to the pharmacy. Please send.

## 2012-11-15 NOTE — Telephone Encounter (Signed)
See phone note of 11/09/2012.

## 2012-11-15 NOTE — Telephone Encounter (Signed)
Results faxed to PCP

## 2012-11-23 ENCOUNTER — Telehealth: Payer: Self-pay

## 2012-11-23 NOTE — Telephone Encounter (Signed)
Pt called and said he just started his Imuran this morning at 7:00 AM. At 8:00 AM he started vomiting and having diarrhea. He asks if it could be from the Imuran that quickly. Please advise!

## 2012-11-23 NOTE — Telephone Encounter (Signed)
PLEASE CALL PT.  IMURAN MAY CAUSE VOMITING AND DIARRHEA. HE SHOULD TRY TO TAKE MEDS ON FRI AM. IF HE DEVELOP VOMITING AND DIARRHEA AFTERWARDS HE SHOULD CALL ME.

## 2012-11-23 NOTE — Telephone Encounter (Signed)
Called and informed pt.  

## 2012-11-29 ENCOUNTER — Telehealth: Payer: Self-pay | Admitting: Gastroenterology

## 2012-11-29 ENCOUNTER — Encounter (INDEPENDENT_AMBULATORY_CARE_PROVIDER_SITE_OTHER): Payer: Medicaid Other | Admitting: Gastroenterology

## 2012-11-29 NOTE — Progress Notes (Deleted)
  Subjective:    Patient ID: [name], male    DOB: [birth year], 47 y.o.   MRN: [id number]   PCP: Legrand Rams  HPI   Past Medical History  Diagnosis Date  . Crohn's colitis     s/p total colectomy with ileostomy in 2009  . Nystagmus   . Bipolar disorder   . Asthma   . GERD (gastroesophageal reflux disease)   . Hyperlipidemia   . Insomnia   . Enterococcus UTI 2009  . 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 DVT (deep venous thrombosis) 2009    right upper extremity due to PICC  . S/P endoscopy Sept 2012    mild gastritis, small hiatal hernia, no ulcers  . Crohn's disease of small intestine Sept 2012    ileoscopy: multiple ulcers likely secondary to Crohn's  . Schizophrenia, acute   . Migraine headache   . Hernia   . Colostomy in place    Past Surgical History  Procedure Laterality Date  . Total colectomy  2009    with ileostomy at [facility] for refractory disease   . Total shoulder replacement      bilateral  . Total hip arthroplasty      right, due to avascular necrosis from chronic prednisone  . Kidney surgery    . Appendectomy    . Eye surgery    . Esophagogastroduodenoscopy  07/2010    gastritis,  bx neg for H.Pylori  . Small bowel capsule study  08/2010    few tiny nonbleeding erosions/ulcers ?secondary to relafen or Crohn's. Entire small bowel not seen.   . Ileoscopy  06/29/2011    SLF: ulcers,multiple/small HH/mild gastritis      Review of Systems     Objective:   Physical Exam        Assessment & Plan:

## 2012-11-29 NOTE — Telephone Encounter (Signed)
CALL PT TO [place].

## 2012-11-29 NOTE — Telephone Encounter (Signed)
Pt was a no show

## 2012-11-30 NOTE — Progress Notes (Signed)
ERROR

## 2012-12-07 ENCOUNTER — Telehealth: Payer: Self-pay

## 2012-12-07 NOTE — Telephone Encounter (Signed)
T/C from [place] at [facility], said she would like to do pt's labs today because of possible bad weather tomorrow, and I told her I was sure that would be OK with Dr. Oneida Alar.

## 2012-12-07 NOTE — Telephone Encounter (Signed)
REVIEWED. AGREE. 

## 2012-12-15 ENCOUNTER — Encounter: Payer: Self-pay | Admitting: Gastroenterology

## 2013-01-10 ENCOUNTER — Encounter: Payer: Self-pay | Admitting: Gastroenterology

## 2013-01-10 ENCOUNTER — Ambulatory Visit (INDEPENDENT_AMBULATORY_CARE_PROVIDER_SITE_OTHER): Payer: Medicaid Other | Admitting: Gastroenterology

## 2013-01-10 VITALS — BP 106/74 | HR 88 | Temp 97.4°F | Ht 66.0 in | Wt 203.6 lb

## 2013-01-10 DIAGNOSIS — K6289 Other specified diseases of anus and rectum: Secondary | ICD-10-CM | POA: Insufficient documentation

## 2013-01-10 DIAGNOSIS — K5000 Crohn's disease of small intestine without complications: Secondary | ICD-10-CM

## 2013-01-10 MED ORDER — HYDROCORTISONE ACE-PRAMOXINE 1-1 % RE CREA: TOPICAL_CREAM | RECTAL | Status: DC

## 2013-01-10 NOTE — Patient Instructions (Addendum)
USE PROCTO-CREAM FOUR TIMES A DAY FOR 10 DAYS TO TREAT YOUR RECTAL PAIN. CALL IF YOU ARE NOT BETTER IN 7 DAYS.   STOP SMOKING.  CONTINUE IMURAN.  LABS BY HOME HEALTH AROUND APR 22.  FOLLOW UP IN 4 MOS.

## 2013-01-10 NOTE — Assessment & Plan Note (Signed)
MOST LIKELY DUE TO HEMORRHOIDS, LESS LIKELY PERIANAL FISTULA.  PROCTO-CREAM QID FOR 10 DAYS. CALL WITH QUESTIONS OR CONCERNS.

## 2013-01-10 NOTE — Progress Notes (Signed)
Cc PCP 

## 2013-01-10 NOTE — Assessment & Plan Note (Signed)
SX CONTROLLED ON IMURAN 100 MG DAILY. LABS NL.  REPEAT LABS APR 22 CONTINUE IMURAN OPV IN 4 MOS

## 2013-01-10 NOTE — Progress Notes (Signed)
Reminder in epic °

## 2013-01-10 NOTE — Progress Notes (Signed)
Subjective:    Patient ID: [name], male    DOB: 04-04-1966, 47 y.o.   MRN: [id number]  PCP: [facility]   HPI FEELS BETTER WITH NEW MED BUT SOMETHING IS HANGING OUT OF HIS BUTT. FEELS QUEASY AFTER TAKING IT ON AN EMPTY STOMACH. NO DIARRHEA UNLESS HE DRINKS 8-9 CUPS OF COFFEE. SOFT STOOLS 3-4X/DAY. NO BLOOD OR ABD PAIN. ABD PAIN WHEN HE EATS TOO MUCH. SOMETHING HANGNG OUT OF HIS RECTUM FOR A COUPLE OF WEEKS. HAS RECTAL ITCHING BUT NO PRESSURE OR PAIN. STILL SMOKING 1/2 PK/DAY.  PT DENIES FEVER, CHILLS, BRBPR, vomiting, melena, diarrhea, constipation,  problems swallowing, heartburn or indigestion.    Past Medical History  Diagnosis Date  . Crohn's colitis     s/p total colectomy with ileostomy in 2009  . Nystagmus   . Bipolar disorder   . Asthma   . GERD (gastroesophageal reflux disease)   . Hyperlipidemia   . Insomnia   . Enterococcus UTI 2009  . 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 DVT (deep venous thrombosis) 2009    right upper extremity due to PICC  . S/P endoscopy Sept 2012    mild gastritis, small hiatal hernia, no ulcers  . Crohn's disease of small intestine Sept 2012    ileoscopy: multiple ulcers likely secondary to Crohn's  . Schizophrenia, acute   . Migraine headache   . Hernia   . Colostomy in place     Past Surgical History  Procedure Laterality Date  . Total colectomy  2009    with ileostomy at [facility] for refractory disease   . Total shoulder replacement      bilateral  . Total hip arthroplasty      right, due to avascular necrosis from chronic prednisone  . Kidney surgery    . Appendectomy    . Eye surgery    . Esophagogastroduodenoscopy  07/2010    gastritis,  bx neg for H.Pylori  . Small bowel capsule study  08/2010    few tiny nonbleeding erosions/ulcers ?secondary to relafen or Crohn's. Entire small bowel not seen.   . Ileoscopy  06/29/2011    SLF: ulcers,multiple/small HH/mild gastritis    Allergies  Allergen Reactions  . Aspirin     REACTION: unknown reaction  . Codeine     REACTION: unknown reaction  . Ibuprofen     REACTION: unknown reaction  . Penicillins     REACTION: unknown reaction  . Tramadol Hcl     REACTION: unknown reaction  . Vicodin (Hydrocodone-Acetaminophen) Nausea Only    Current Outpatient Prescriptions  Medication Sig Dispense Refill  . albuterol (VENTOLIN HFA) 108 (90 BASE) MCG/ACT inhaler Inhale 1-2 puffs into the lungs every 4 (four) hours as needed. For wheezing and/or difficulty with breathing      . azathioprine (IMURAN) 100 MG tablet Take 1 tablet (100 mg total) by mouth daily.    . benztropine (COGENTIN) 0.5 MG tablet Take 0.5 mg by mouth daily.     . Calcium Carbonate-Vitamin D (CALCIUM 600 + D PO) Take 2 tablets by mouth 2 (two) times daily.     . ciprofloxacin (CIPRO) 500 MG tablet Take 500 mg by mouth 2 (two) times daily.    . divalproex (DEPAKOTE ER) 250 MG 24 hr tablet Take 250 mg by mouth at bedtime.     . divalproex (DEPAKOTE) 500 MG 24 hr tablet Take 1,000 mg by mouth every evening.     Marland Kitchen  folic acid (FOLVITE) 1 MG tablet Take 1 mg by mouth daily.      . furosemide (LASIX) 40 MG tablet Take 40 mg by mouth daily.      Marland Kitchen gabapentin (NEURONTIN) 300 MG capsule Take 900 mg by mouth at bedtime.     . metoprolol succinate (TOPROL-XL) 25 MG 24 hr tablet Take 12.5 mg by mouth every evening.     Marland Kitchen omeprazole (PRILOSEC) 20 MG capsule Take 1 capsule (20 mg total) by mouth 2 (two) times daily.    . potassium chloride SA (K-DUR,KLOR-CON) 20 MEQ tablet Take 20 mEq by mouth 2 (two) times daily.      . pravastatin (PRAVACHOL) 20 MG tablet Take 20 mg by mouth at bedtime.       . risperiDONE (RISPERDAL) 3 MG tablet Take 9 mg by mouth at bedtime.      . Tamsulosin HCl (FLOMAX) 0.4 MG CAPS Take 0.4 mg by mouth daily.        . traZODone (DESYREL) 50 MG tablet Take 50 mg by mouth at bedtime as needed.       Marland Kitchen acetaminophen (TYLENOL) 500 MG tablet Take  1,000 mg by mouth every 6 (six) hours as needed. For pain       . busPIRone (BUSPAR) 5 MG tablet Take 5 mg by mouth 3 (three) times daily as needed. For anxiety      . cetirizine (ZYRTEC) 10 MG tablet Take 10 mg by mouth daily.        Marland Kitchen darifenacin (ENABLEX) 7.5 MG 24 hr tablet Take 7.5 mg by mouth daily.      Marland Kitchen ipratropium (ATROVENT) 0.02 % nebulizer solution Take 500 mcg by nebulization 3 (three) times daily. Shortness of breath      .        Marland Kitchen Olopatadine HCl (PATADAY) 0.2 % SOLN Apply 1 drop to eye 2 (two) times daily. Patient use in left eye      . oxyCODONE-acetaminophen (PERCOCET/ROXICET) 5-325 MG per tablet Take 1 tablet by mouth every 6 (six) hours as needed. Take 1-2 tablets q6hr prn      .      .      .      . rOPINIRole (REQUIP) 2 MG tablet Take 2 mg by mouth at bedtime.        . sertraline (ZOLOFT) 50 MG tablet Take 50 mg by mouth daily.      Review of Systems MAR 2014: CBC/HFP NL    Objective:   Physical Exam  Vitals reviewed. Constitutional: He is oriented to person, place, and time. He appears well-nourished. No distress.  HENT:  Head: Normocephalic and atraumatic.  Mouth/Throat: Oropharynx is clear and moist. No oropharyngeal exudate.  Eyes: Pupils are equal, round, and reactive to light. No scleral icterus.  Neck: Normal range of motion. Neck supple.  Cardiovascular: Normal rate, regular rhythm and normal heart sounds.   Pulmonary/Chest: Effort normal and breath sounds normal. No respiratory distress.  Abdominal: Soft. Bowel sounds are normal. He exhibits no distension. There is no tenderness.  Genitourinary:     RECTUM-RED SWOLLEN AREA W/O DRAINAGE-POS TTP  Musculoskeletal: He exhibits no edema.  Lymphadenopathy:    He has no cervical adenopathy.  Neurological: He is alert and oriented to person, place, and time.  NO  NEW FOCAL DEFICITS   Psychiatric: He has a normal mood and affect.          Assessment & Plan:

## 2013-01-11 ENCOUNTER — Telehealth: Payer: Self-pay

## 2013-01-11 NOTE — Telephone Encounter (Signed)
PLEASE CALL PT's and get name of the covered alternative.

## 2013-01-11 NOTE — Telephone Encounter (Signed)
Pt called and said his prescription for the Procto cream was going to be $60.00 and he needs an alternative sent to the pharmacy.

## 2013-01-11 NOTE — Telephone Encounter (Signed)
Spoke to the pharmacist, Lytle Michaels. He said if it was the 2.5% that the insurance will pay for it. Please advise!

## 2013-01-12 MED ORDER — HYDROCORTISONE 2.5 % RE CREA: TOPICAL_CREAM | Freq: Two times a day (BID) | RECTAL | Status: DC

## 2013-01-12 NOTE — Telephone Encounter (Signed)
PLEASE CALL PT. Rx sent.

## 2013-01-16 NOTE — Telephone Encounter (Signed)
Called to inform pt.

## 2013-02-14 ENCOUNTER — Ambulatory Visit (INDEPENDENT_AMBULATORY_CARE_PROVIDER_SITE_OTHER): Payer: Medicaid Other | Admitting: Orthopedic Surgery

## 2013-02-14 ENCOUNTER — Encounter: Payer: Self-pay | Admitting: Orthopedic Surgery

## 2013-02-14 VITALS — BP 102/62 | Ht 66.0 in | Wt 208.0 lb

## 2013-02-14 DIAGNOSIS — M1710 Unilateral primary osteoarthritis, unspecified knee: Secondary | ICD-10-CM

## 2013-02-14 DIAGNOSIS — M179 Osteoarthritis of knee, unspecified: Secondary | ICD-10-CM | POA: Insufficient documentation

## 2013-02-14 NOTE — Progress Notes (Signed)
Subjective:    Patient ID: [name], male    DOB: 02-21-1966, 47 y.o.   MRN: [id number]  HPI Comments: The patient has had gradual onset of left knee pain for 6 months no history of trauma. No treatment other than Tylenol has several allergies to pain medications and anti-inflammatories.  Knee Pain  The incident occurred more than 1 week ago. There was no injury mechanism. The pain is present in the left knee and left leg. The quality of the pain is described as stabbing (Sharp and throbbing). The pain is at a severity of 9/10. The pain has been worsening since onset. Pertinent negatives include no inability to bear weight, loss of motion, loss of sensation, muscle weakness, numbness or tingling. Associated symptoms comments: Locking catching swelling.      Review of Systems  Allergic/Immunologic: Positive for environmental allergies.  Neurological: Negative for tingling and numbness.  All other systems reviewed and are negative.       Objective:   Physical Exam  Vitals reviewed. Constitutional: He is oriented to person, place, and time. He appears well-developed and well-nourished.  Grooming and hygiene are normal  Musculoskeletal:       Right knee: He exhibits no effusion.       Left knee: He exhibits no effusion.  He is ambulatory but with a cane  Neurological: He is alert and oriented to person, place, and time.  Psychiatric: He has a normal mood and affect. His behavior is normal.  Right Knee Exam   Tenderness  Right knee tenderness location: No palpable tenderness no effusion.  Range of Motion  The patient has normal right knee ROM.  Muscle Strength   The patient has normal right knee strength.  Tests  McMurray:  Medial - negative  Drawer:       Anterior - negative    Posterior - negative Varus: negative Valgus: negative  Other  Erythema: absent Scars: absent Sensation: normal Pulse: present Swelling: none Other tests: no effusion present   Left  Knee Exam   Tenderness  The patient is experiencing tenderness in the lateral joint line, medial retinaculum and patella.  Range of Motion  The patient has normal left knee ROM.  Muscle Strength   The patient has normal left knee strength.  Tests  McMurray:  Medial - negative Lateral - negative Drawer:       Anterior - negative     Posterior - negative Varus: negative Valgus: negative  Other  Erythema: absent Scars: absent Sensation: normal Pulse: present Swelling: none Effusion: no effusion present     The patient demonstrated excellent range of motion in both shoulders full strength no instability skin was normal there is no swelling or tenderness   The patient brought x-rays of his knee on a disk he has mild to moderate degenerative arthrosis primarily on the medial side with osteophytes joint space narrowing and subchondral sclerosis       Assessment & Plan:  Arthritis of knee, degenerative  This patient is not a surgical candidate. He is 47 years old he has a colostomy he has multiple medical problems so I gave him a cortisone injection. If this does not prove helpful to him he should be referred to a joint replacement specialist at [facility] who can better advise him on his options and if he does prove to need surgery can be managed in terms of his Crohn's disease in the perioperative period although I doubt surgical management is the answer  for him. He may need chronic pain management secondary to his multiple allergies

## 2013-02-14 NOTE — Patient Instructions (Signed)
You have received a steroid shot. 15% of patients experience increased pain at the injection site with in the next 24 hours. This is best treated with ice and tylenol extra strength 2 tabs every 8 hours. If you are still having pain please call the office.   Wear and Tear Disorders of the Knee (Arthritis, Osteoarthritis) Everyone will experience wear and tear injuries (arthritis, osteoarthritis) of the knee. These are the changes we all get as we age. They come from the joint stress of daily living. The amount of cartilage damage in your knee and your symptoms determine if you need surgery. Mild problems require approximately two months recovery time. More severe problems take several months to recover. With mild problems, your surgeon may find worn and rough cartilage surfaces. With severe changes, your surgeon may find cartilage that has completely worn away and exposed the bone. Loose bodies of bone and cartilage, bone spurs (excess bone growth), and injuries to the menisci (cushions between the large bones of your leg) are also common. All of these problems can cause pain. For a mild wear and tear problem, rough cartilage may simply need to be shaved and smoothed. For more severe problems with areas of exposed bone, your surgeon may use an instrument for roughing up the bone surfaces to stimulate new cartilage growth. Loose bodies are usually removed. Torn menisci may be trimmed or repaired. ABOUT THE ARTHROSCOPIC PROCEDURE Arthroscopy is a surgical technique. It allows your orthopedic surgeon to diagnose and treat your knee injury with accuracy. The surgeon looks into your knee through a small scope. The scope is like a small (pencil-sized) telescope. Arthroscopy is less invasive than open knee surgery. You can expect a more rapid recovery. After the procedure, you will be moved to a recovery area until most of the effects of the medication have worn off. Your caregiver will discuss the test results with  you. RECOVERY The severity of the arthritis and the type of procedure performed will determine recovery time. Other important factors include age, physical condition, medical conditions, and the type of rehabilitation program. Strengthening your muscles after arthroscopy helps guarantee a better recovery. Follow your caregiver's instructions. Use crutches, rest, elevate, ice, and do knee exercises as instructed. Your caregivers will help you and instruct you with exercises and other physical therapy required to regain your mobility, muscle strength, and functioning following surgery. Only take over-the-counter or prescription medicines for pain, discomfort, or fever as directed by your caregiver.  SEEK MEDICAL CARE IF:   There is increased bleeding (more than a small spot) from the wound.  You notice redness, swelling, or increasing pain in the wound.  Pus is coming from wound.  You develop an unexplained oral temperature above 102 F (38.9 C) , or as your caregiver suggests.  You notice a foul smell coming from the wound or dressing.  You have severe pain with motion of the knee. SEEK IMMEDIATE MEDICAL CARE IF:   You develop a rash.  You have difficulty breathing.  You have any allergic problems. MAKE SURE YOU:   Understand these instructions.  Will watch your condition.  Will get help right away if you are not doing well or get worse. Document Released: 09/17/2000 Document Revised: 12/13/2011 Document Reviewed: 02/14/2008 [facility] Patient Information 2013 [place].

## 2013-02-15 ENCOUNTER — Telehealth: Payer: Self-pay | Admitting: Orthopedic Surgery

## 2013-02-15 NOTE — Telephone Encounter (Signed)
Patient called, states knee is very sore since injection yesterday, 02/14/13; said has iced, elevated, taken Tylenol; please call and advise - ph [phone]

## 2013-02-15 NOTE — Telephone Encounter (Signed)
Returned call to patient and advised him to give shot a little time to work, and if no better to call back to be referred to joint specialist. (per DR. Harrison's note)

## 2013-02-19 ENCOUNTER — Telehealth: Payer: Self-pay

## 2013-02-19 NOTE — Telephone Encounter (Signed)
PLEASE CALL PT. TO TREAT HIS CONSTIPATION:  HE SHOULD FOLLOW A HIGH FIBER DIET.  DRINK WATER TO KEEP HIS URINE LIGHT YELLOW.  HE SHOULD USE MOM ONCE OR TWICE A DAY FOR THE NEXT 7 DAYS. HOLD FOR CONSTIPATION.

## 2013-02-19 NOTE — Telephone Encounter (Signed)
Called pt and informed. He said his hemorrhoids are also causing some itching and burning. Please advise!

## 2013-02-19 NOTE — Telephone Encounter (Signed)
PLEASE CALL PT. To treat his hemorrhoids he should:  CONTINUE WEIGHT LOSS EFFORTS AND EXERCISE.  USE PREPARATION H CREAM OR SUPPOSITORIES qid for 10 DAYS FOR RECTAL BLEEDING OR PAIN. CALL IN 7 DAYS IF SYMPTOMS ARE NOT IMPROVED.

## 2013-02-19 NOTE — Telephone Encounter (Signed)
Pt called and said he has been constipated x 4 days. Said he finally had a BM this AM and it was so hard and clumped around stoma. Please advise!

## 2013-02-19 NOTE — Telephone Encounter (Signed)
Pt is aware.  

## 2013-02-20 ENCOUNTER — Other Ambulatory Visit: Payer: Self-pay

## 2013-02-20 ENCOUNTER — Telehealth: Payer: Self-pay

## 2013-02-20 DIAGNOSIS — R109 Unspecified abdominal pain: Secondary | ICD-10-CM

## 2013-02-20 NOTE — Telephone Encounter (Signed)
Pt called and said he has had N/V this AM. Tenderness around the stoma. No BM. Could not afford the milk of mag because it was OTC med. Michela Pitcher he is having abdominal cramps and sometimes it almost doubles him over. Please advise! ( Sending pager to Dr. Oneida Alar also).

## 2013-02-20 NOTE — Telephone Encounter (Signed)
I called pt and he said he is feeling a lot better. Now mostly concerned about the tenderness around the stoma. He said he does not have a way to do the labs or CT today. He has to ask for transportation 2 days in advance. He said he would like to come to the office on Friday. I offered to send the lab orders to [facility] and he declined, said he is much better.

## 2013-02-20 NOTE — Telephone Encounter (Signed)
REVIEWED.  

## 2013-02-20 NOTE — Telephone Encounter (Signed)
[facility]. Malachy Mood is in a meeting. LMOVM for a return call.

## 2013-02-20 NOTE — Telephone Encounter (Signed)
Pt called back and said fax the lab orders to [facility]. I will call them and fax the orders.

## 2013-02-20 NOTE — Telephone Encounter (Signed)
Pt is aware that I will speak to the Home Health Nurse about the lab order.

## 2013-02-20 NOTE — Telephone Encounter (Signed)
Pt called and said if he needs CT it would have to be scheduled out several days. I told him that since he was not able to get ASAP when hurting bad, that Dr. Oneida Alar will probably wait til she gets results of his labs. He is feeling better and had a BM.

## 2013-02-20 NOTE — Telephone Encounter (Signed)
PLEASE CALL PT. Call pt. If he is having severe abd pain, nausea, vomiting, and no BMs,  He SHOULD EITHER GO TO the ED for an evaluation OR should have a STAT Labs: CMP/LIPASE & CT SCAN OF THE ABDOMEN AND PELVIS TODAY.

## 2013-02-20 NOTE — Telephone Encounter (Signed)
I received a call from Janett Labella, nurse from [facility]. She is now Tul[facility]'s nurse and she said she can draw his labs this afternoon. Faxed the orders to her.

## 2013-02-20 NOTE — Telephone Encounter (Signed)
PLEASE CALL PT.  If he's feeling better he does not need the CT. We will put the order in and he can decide based on his abd pain severity when he will have the test done.

## 2013-02-21 NOTE — Telephone Encounter (Signed)
REVIEWED.  

## 2013-02-21 NOTE — Telephone Encounter (Signed)
Called and informed pt. He said he is feeling much better today. Thinks he had a virus since it is going around there. He is aware the CT will be ordered so if he has a severe abdominal pain episode.

## 2013-02-22 ENCOUNTER — Telehealth: Payer: Self-pay

## 2013-02-22 ENCOUNTER — Telehealth: Payer: Self-pay | Admitting: Gastroenterology

## 2013-02-22 NOTE — Telephone Encounter (Signed)
Called and informed pt.  

## 2013-02-22 NOTE — Telephone Encounter (Signed)
PLEASE CALL PT. HIS LIVER AND PANCREAS TESTS ARE NORMAL.

## 2013-02-22 NOTE — Telephone Encounter (Signed)
I called pt to give lab results. He wants to know if Dr. Oneida Alar will ask [place] to come out once every 2 weeks to shave around his stoma. Please advise!

## 2013-02-23 NOTE — Telephone Encounter (Signed)
REVIEWED.  

## 2013-02-23 NOTE — Telephone Encounter (Signed)
I called [facility] and spoke to Janett Labella, the nurse. She said that is not a skilled nursing duty. She said the last time that she went to draw his labs, pt seems very capable of doing it himself. Said they have a tech that goes out to do baths, but he doesn't look like he would qualify for that as independent as he is at this time.

## 2013-02-23 NOTE — Telephone Encounter (Signed)
Called and informed pt.  

## 2013-02-23 NOTE — Telephone Encounter (Signed)
PLEASE CALL PT and let him know.

## 2013-02-23 NOTE — Telephone Encounter (Signed)
Ok to give order for hh to shave around stoma.

## 2013-04-02 ENCOUNTER — Telehealth: Payer: Self-pay

## 2013-04-02 NOTE — Telephone Encounter (Signed)
Pt called and requested a high fiber diet. Mailed.

## 2013-04-18 ENCOUNTER — Ambulatory Visit (INDEPENDENT_AMBULATORY_CARE_PROVIDER_SITE_OTHER): Payer: Medicaid Other | Admitting: Gastroenterology

## 2013-04-18 ENCOUNTER — Encounter: Payer: Self-pay | Admitting: Gastroenterology

## 2013-04-18 VITALS — BP 127/74 | HR 85 | Temp 97.4°F | Ht 63.0 in | Wt 223.0 lb

## 2013-04-18 DIAGNOSIS — K5000 Crohn's disease of small intestine without complications: Secondary | ICD-10-CM

## 2013-04-18 DIAGNOSIS — K6289 Other specified diseases of anus and rectum: Secondary | ICD-10-CM

## 2013-04-18 MED ORDER — CLOTRIMAZOLE-BETAMETHASONE 1-0.05 % EX CREA: TOPICAL_CREAM | CUTANEOUS | Status: DC

## 2013-04-18 NOTE — Progress Notes (Signed)
Cc PCP 

## 2013-04-18 NOTE — Patient Instructions (Signed)
CONTINUE IMURAN.  USE LOTRISONE 2-3 TIMES  AD AY AS NEEDED FOR RECTAL ITCHING. DO NOT USE FOR MORE THAN 10 DAYS STRAIGHT AND ONLY USE 3-4 TIMES A YEAR.  FOLLOW UP IN 4 MOS.  YOU NEED A CBC/CMP 1 WEEK PRIOR TO YOUR NEXT VISIT.

## 2013-04-18 NOTE — Progress Notes (Signed)
Subjective:    Patient ID: [name], male    DOB: [birth year], 47 y.o.   MRN: [id number]  [name] Loth, MD  HPI HAVING HEMORRHOID PROBLEMS. NO RECTAL BLEEDING. HAVING PRESSURE, PAIN, ITCHING, BURNING. HAS A ILEOSTOMY. EMPTYING BAG 2-3 TIMES A DAY. NO BLOOD IN BAG. ATE CREAMED POTATOES, 6 EGGS, FRIED TENDERLOIN, AND BISCUITS, MAC AND CHEESE YESTERDAY. WATERY LIQUID IN BAG TODAY.PT DENIES FEVER, CHILLS, BRBPR, nausea, vomiting, melena, abd pain, problems swallowing, heartburn or indigestion.  Past Medical History  Diagnosis Date  . Crohn's colitis     s/p total colectomy with ileostomy in 2009  . Nystagmus   . Bipolar disorder   . Asthma   . GERD (gastroesophageal reflux disease)   . Hyperlipidemia   . Insomnia   . Enterococcus UTI 2009  . 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 DVT (deep venous thrombosis) 2009    right upper extremity due to PICC  . S/P endoscopy Sept 2012    mild gastritis, small hiatal hernia, no ulcers  . Crohn's disease of small intestine Sept 2012    ileoscopy: multiple ulcers likely secondary to Crohn's  . Schizophrenia, acute   . Migraine headache   . Hernia   . Colostomy in place    Past Surgical History  Procedure Laterality Date  . Total colectomy  2009    with ileostomy at [facility] for refractory disease   . Total shoulder replacement      bilateral  . Total hip arthroplasty      right, due to avascular necrosis from chronic prednisone  . Kidney surgery    . Appendectomy    . Eye surgery    . Esophagogastroduodenoscopy  07/2010    gastritis,  bx neg for H.Pylori  . Small bowel capsule study  08/2010    few tiny nonbleeding erosions/ulcers ?secondary to relafen or Crohn's. Entire small bowel not seen.   . Ileoscopy  06/29/2011    SLF: ulcers,multiple/small HH/mild gastritis   Allergies  Allergen Reactions  . Aspirin     REACTION: unknown reaction  . Codeine     REACTION: unknown  reaction  . Ibuprofen     REACTION: unknown reaction  . Penicillins     REACTION: unknown reaction  . Tramadol Hcl     REACTION: unknown reaction  . Vicodin (Hydrocodone-Acetaminophen) Nausea Only    Current Outpatient Prescriptions  Medication Sig Dispense Refill  . acetaminophen (TYLENOL) 500 MG tablet Take 1,000 mg by mouth every 6 (six) hours as needed. For pain       . albuterol (VENTOLIN HFA) 108 (90 BASE) MCG/ACT inhaler Inhale 1-2 puffs into the lungs every 4 (four) hours as needed. For wheezing and/or difficulty with breathing      . azathioprine (IMURAN) 100 MG tablet Take 1 tablet (100 mg total) by mouth daily.  31 tablet  11  . benztropine (COGENTIN) 0.5 MG tablet Take 0.5 mg by mouth daily.       . busPIRone (BUSPAR) 5 MG tablet Take 5 mg by mouth 3 (three) times daily as needed. For anxiety      . Calcium Carbonate-Vitamin D (CALCIUM 600 + D PO) Take 2 tablets by mouth 2 (two) times daily.       . divalproex (DEPAKOTE ER) 250 MG 24 hr tablet Take 250 mg by mouth at bedtime.       . divalproex (DEPAKOTE) 500 MG 24  hr tablet Take 1,000 mg by mouth every evening.       . folic acid (FOLVITE) 1 MG tablet Take 1 mg by mouth daily.        . furosemide (LASIX) 40 MG tablet Take 40 mg by mouth daily.        Marland Kitchen gabapentin (NEURONTIN) 300 MG capsule Take 900 mg by mouth at bedtime.       . metoprolol succinate (TOPROL-XL) 25 MG 24 hr tablet Take 12.5 mg by mouth every evening.       Marland Kitchen omeprazole (PRILOSEC) 20 MG capsule Take 20 mg by mouth daily.      . potassium chloride SA (K-DUR,KLOR-CON) 20 MEQ tablet Take 20 mEq by mouth 2 (two) times daily.      . pravastatin (PRAVACHOL) 20 MG tablet Take 20 mg by mouth at bedtime.       . risperiDONE (RISPERDAL) 3 MG tablet Take 9 mg by mouth at bedtime.      . sulfamethoxazole-trimethoprim (BACTRIM DS) 800-160 MG per tablet Take 1 tablet by mouth 2 (two) times daily.      . Tamsulosin HCl (FLOMAX) 0.4 MG CAPS Take 0.4 mg by mouth daily.         . traZODone (DESYREL) 50 MG tablet Take 100 mg by mouth at bedtime as needed.       . cetirizine (ZYRTEC) 10 MG tablet Take 10 mg by mouth daily.        . ciprofloxacin (CIPRO) 500 MG tablet Take 500 mg by mouth 2 (two) times daily.      Marland Kitchen darifenacin (ENABLEX) 7.5 MG 24 hr tablet Take 7.5 mg by mouth daily.      . hydrocortisone (ANUSOL-HC) 2.5 % rectal cream Place rectally 2 (two) times daily. USE FOR 10 DAYS    . pramoxine-hydrocortisone (PROCTOCREAM-HC) 1-1 % rectal cream USE PR 4 TIMES A DAY FOR 10 DAYS.         Review of Systems     Objective:   Physical Exam  Vitals reviewed. Constitutional: He is oriented to person, place, and time. He appears well-nourished. No distress.  HENT:  Head: Normocephalic and atraumatic.  Mouth/Throat: Oropharynx is clear and moist. No oropharyngeal exudate.  Eyes: Pupils are equal, round, and reactive to light. No scleral icterus.  Neck: Normal range of motion. Neck supple.  Cardiovascular: Normal rate, regular rhythm and normal heart sounds.   Pulmonary/Chest: Effort normal and breath sounds normal. No respiratory distress.  Abdominal: Soft. Bowel sounds are normal. He exhibits no distension. There is tenderness (MILD RIGHT SIDED PAIN). There is no rebound and no guarding.  Genitourinary: Rectal exam shows no external hemorrhoid, no internal hemorrhoid, no fissure, no mass and no tenderness.  OLD FISTULA TRACT HEALED RECTUM SEWN SHUT  Musculoskeletal: Normal range of motion.  Neurological: He is alert and oriented to person, place, and time.  NO FOCAL DEFICITS   Psychiatric: He has a normal mood and affect.          Assessment & Plan:

## 2013-04-18 NOTE — Assessment & Plan Note (Signed)
Sx CONTROLLED.  CONTINUE IMURAN OPV IN 4 MOS NEEDS CBC/CMP 1 WEEK PRIOR TO OPV

## 2013-04-18 NOTE — Assessment & Plan Note (Signed)
SX MOST LIKE;Y DUE TO YEAST OVERGROWTH.  LOTRIZONE 3-3 TIMES A DAY. OPV IN 4 MOS

## 2013-04-18 NOTE — Progress Notes (Signed)
REMINDER APPT ALREADY IN EPIC FOR NOV 2014

## 2013-04-25 ENCOUNTER — Other Ambulatory Visit (HOSPITAL_COMMUNITY): Payer: Self-pay | Admitting: Urology

## 2013-04-25 DIAGNOSIS — N135 Crossing vessel and stricture of ureter without hydronephrosis: Secondary | ICD-10-CM

## 2013-04-25 DIAGNOSIS — N400 Enlarged prostate without lower urinary tract symptoms: Secondary | ICD-10-CM

## 2013-04-30 ENCOUNTER — Other Ambulatory Visit (HOSPITAL_COMMUNITY): Payer: Medicaid Other

## 2013-05-02 ENCOUNTER — Ambulatory Visit (HOSPITAL_COMMUNITY): Payer: Medicaid Other

## 2013-05-03 ENCOUNTER — Other Ambulatory Visit (HOSPITAL_COMMUNITY): Payer: Self-pay | Admitting: Urology

## 2013-05-03 DIAGNOSIS — N390 Urinary tract infection, site not specified: Secondary | ICD-10-CM

## 2013-05-03 DIAGNOSIS — R109 Unspecified abdominal pain: Secondary | ICD-10-CM

## 2013-05-04 ENCOUNTER — Ambulatory Visit (HOSPITAL_COMMUNITY): Payer: Medicaid Other

## 2013-05-07 ENCOUNTER — Ambulatory Visit (HOSPITAL_COMMUNITY)
Admission: RE | Admit: 2013-05-07 | Discharge: 2013-05-07 | Disposition: A | Discharge status: Home / Self Care | Payer: Medicaid Other | Source: Ambulatory Visit | Attending: Urology | Admitting: Urology

## 2013-05-07 DIAGNOSIS — N390 Urinary tract infection, site not specified: Secondary | ICD-10-CM | POA: Insufficient documentation

## 2013-05-07 DIAGNOSIS — R109 Unspecified abdominal pain: Secondary | ICD-10-CM | POA: Insufficient documentation

## 2013-05-31 ENCOUNTER — Other Ambulatory Visit: Payer: Self-pay

## 2013-05-31 ENCOUNTER — Telehealth: Payer: Self-pay | Admitting: *Deleted

## 2013-05-31 ENCOUNTER — Emergency Department (HOSPITAL_COMMUNITY): Payer: Medicaid Other

## 2013-05-31 ENCOUNTER — Encounter (HOSPITAL_COMMUNITY): Payer: Self-pay

## 2013-05-31 ENCOUNTER — Inpatient Hospital Stay (HOSPITAL_COMMUNITY)
Admission: EM | Admit: 2013-05-31 | Discharge: 2013-06-11 | DRG: 336 | Disposition: A | Discharge status: Home / Self Care | Payer: Medicaid Other | Attending: General Surgery | Admitting: General Surgery

## 2013-05-31 DIAGNOSIS — E785 Hyperlipidemia, unspecified: Secondary | ICD-10-CM | POA: Diagnosis present

## 2013-05-31 DIAGNOSIS — K5010 Crohn's disease of large intestine without complications: Secondary | ICD-10-CM | POA: Diagnosis present

## 2013-05-31 DIAGNOSIS — K5000 Crohn's disease of small intestine without complications: Secondary | ICD-10-CM

## 2013-05-31 DIAGNOSIS — F319 Bipolar disorder, unspecified: Secondary | ICD-10-CM | POA: Diagnosis present

## 2013-05-31 DIAGNOSIS — E876 Hypokalemia: Secondary | ICD-10-CM | POA: Diagnosis not present

## 2013-05-31 DIAGNOSIS — G43909 Migraine, unspecified, not intractable, without status migrainosus: Secondary | ICD-10-CM | POA: Diagnosis present

## 2013-05-31 DIAGNOSIS — K219 Gastro-esophageal reflux disease without esophagitis: Secondary | ICD-10-CM | POA: Diagnosis present

## 2013-05-31 DIAGNOSIS — IMO0002 Reserved for concepts with insufficient information to code with codable children: Secondary | ICD-10-CM

## 2013-05-31 DIAGNOSIS — J45909 Unspecified asthma, uncomplicated: Secondary | ICD-10-CM | POA: Diagnosis present

## 2013-05-31 DIAGNOSIS — K565 Intestinal adhesions [bands], unspecified as to partial versus complete obstruction: Principal | ICD-10-CM | POA: Diagnosis present

## 2013-05-31 DIAGNOSIS — Z932 Ileostomy status: Secondary | ICD-10-CM

## 2013-05-31 DIAGNOSIS — Z9049 Acquired absence of other specified parts of digestive tract: Secondary | ICD-10-CM

## 2013-05-31 DIAGNOSIS — K9419 Other complications of enterostomy: Secondary | ICD-10-CM

## 2013-05-31 DIAGNOSIS — Z96619 Presence of unspecified artificial shoulder joint: Secondary | ICD-10-CM

## 2013-05-31 DIAGNOSIS — Z79899 Other long term (current) drug therapy: Secondary | ICD-10-CM

## 2013-05-31 DIAGNOSIS — R197 Diarrhea, unspecified: Secondary | ICD-10-CM

## 2013-05-31 DIAGNOSIS — K56600 Partial intestinal obstruction, unspecified as to cause: Secondary | ICD-10-CM

## 2013-05-31 DIAGNOSIS — Z96649 Presence of unspecified artificial hip joint: Secondary | ICD-10-CM

## 2013-05-31 DIAGNOSIS — F2089 Other schizophrenia: Secondary | ICD-10-CM | POA: Diagnosis present

## 2013-05-31 DIAGNOSIS — F17200 Nicotine dependence, unspecified, uncomplicated: Secondary | ICD-10-CM | POA: Diagnosis present

## 2013-05-31 LAB — CBC WITH DIFFERENTIAL/PLATELET
Eosinophils Absolute: 0 10*3/uL (ref 0.0–0.7)
Eosinophils Relative: 0 % (ref 0–5)
Hemoglobin: 16.6 g/dL (ref 13.0–17.0)
Lymphs Abs: 0.8 10*3/uL (ref 0.7–4.0)
MCH: 33.4 pg (ref 26.0–34.0)
MCV: 94.2 fL (ref 78.0–100.0)
Monocytes Relative: 5 % (ref 3–12)
RBC: 4.97 MIL/uL (ref 4.22–5.81)

## 2013-05-31 LAB — URINALYSIS, ROUTINE W REFLEX MICROSCOPIC
Bilirubin Urine: NEGATIVE
Glucose, UA: NEGATIVE mg/dL
Hgb urine dipstick: NEGATIVE
Nitrite: NEGATIVE
Specific Gravity, Urine: >1.03 — ABNORMAL HIGH (ref 1.005–1.030)
pH: 6 (ref 5.0–8.0)

## 2013-05-31 LAB — COMPREHENSIVE METABOLIC PANEL
Alkaline Phosphatase: 72 U/L (ref 39–117)
BUN: 10 mg/dL (ref 6–23)
Calcium: 10.1 mg/dL (ref 8.4–10.5)
GFR calc Af Amer: >90 mL/min (ref >90)
Glucose, Bld: 137 mg/dL — ABNORMAL HIGH (ref 70–99)
Total Protein: 7.8 g/dL (ref 6.0–8.3)

## 2013-05-31 LAB — LIPASE, BLOOD: Lipase: 21 U/L (ref 11–59)

## 2013-05-31 MED ORDER — LACTATED RINGERS IV SOLN
INTRAVENOUS | Status: DC
  Administered 2013-05-31 – 2013-06-09 (×21): via INTRAVENOUS

## 2013-05-31 MED ORDER — SODIUM CHLORIDE 0.9 % IV SOLN
INTRAVENOUS | Status: DC
  Administered 2013-05-31: 22:00:00 via INTRAVENOUS

## 2013-05-31 MED ORDER — HYDROMORPHONE HCL PF 1 MG/ML IJ SOLN
1.0000 mg | Freq: Once | INTRAMUSCULAR | Status: AC
  Administered 2013-05-31: 1 mg via INTRAVENOUS
  Filled 2013-05-31: qty 1

## 2013-05-31 MED ORDER — HYDROMORPHONE HCL PF 1 MG/ML IJ SOLN
1.0000 mg | INTRAMUSCULAR | Status: DC | PRN
  Administered 2013-06-01 – 2013-06-03 (×14): 1 mg via INTRAVENOUS
  Filled 2013-05-31 (×14): qty 1

## 2013-05-31 MED ORDER — PANTOPRAZOLE SODIUM 40 MG IV SOLR
40.0000 mg | Freq: Every day | INTRAVENOUS | Status: DC
  Administered 2013-05-31 – 2013-06-10 (×11): 40 mg via INTRAVENOUS
  Filled 2013-05-31 (×10): qty 40

## 2013-05-31 MED ORDER — SODIUM CHLORIDE 0.9 % IV BOLUS (SEPSIS)
1000.0000 mL | Freq: Once | INTRAVENOUS | Status: AC
  Administered 2013-05-31: 1000 mL via INTRAVENOUS

## 2013-05-31 MED ORDER — ONDANSETRON HCL 4 MG/2ML IJ SOLN
4.0000 mg | Freq: Once | INTRAMUSCULAR | Status: AC
  Administered 2013-05-31: 4 mg via INTRAVENOUS
  Filled 2013-05-31: qty 2

## 2013-05-31 MED ORDER — IOHEXOL 300 MG/ML  SOLN
100.0000 mL | Freq: Once | INTRAMUSCULAR | Status: AC | PRN
  Administered 2013-05-31: 100 mL via INTRAVENOUS

## 2013-05-31 MED ORDER — IOHEXOL 300 MG/ML  SOLN
50.0000 mL | Freq: Once | INTRAMUSCULAR | Status: AC | PRN
  Administered 2013-05-31: 50 mL via ORAL

## 2013-05-31 MED ORDER — ONDANSETRON HCL 4 MG/2ML IJ SOLN
4.0000 mg | Freq: Four times a day (QID) | INTRAMUSCULAR | Status: DC | PRN
  Administered 2013-06-01 – 2013-06-03 (×5): 4 mg via INTRAVENOUS
  Filled 2013-05-31 (×6): qty 2

## 2013-05-31 MED ORDER — ENOXAPARIN SODIUM 40 MG/0.4ML ~~LOC~~ SOLN
40.0000 mg | SUBCUTANEOUS | Status: DC
  Administered 2013-05-31 – 2013-06-11 (×11): 40 mg via SUBCUTANEOUS
  Filled 2013-05-31 (×12): qty 0.4

## 2013-05-31 NOTE — Telephone Encounter (Signed)
Pt called stating she has stomach cramps, please advise [phone]

## 2013-05-31 NOTE — ED Provider Notes (Signed)
Scribed for [name] Kung, MD, the patient was seen in room APA05/APA05. This chart was scribed by Denice Bors, ED scribe. Patient's care was started at 1731  CSN: [id number]     Arrival date & time 05/31/13  1713 History   First MD Initiated Contact with Patient 05/31/13 1716     Chief Complaint  Patient presents with  . Nausea  . Abdominal Cramping   (Consider location/radiation/quality/duration/timing/severity/associated sxs/prior Treatment) The history is provided by the patient and the EMS personnel.   HPI Comments: [name] is a 47 y.o. male brought in by ambulance, who presents to the Emergency Department with a hx of Crohn's disease complaining of constant severe nausea onset today. Reports associated emesis, constipation, and diffuse abdominal cramping. Reports 7-8 episodes of emesis and no BM today. Denies any aggravating or alleviating factors. Denies associated visual changes, chest pain, shortness of breath, dysuria, hematuria, neck pain, back pain, bleeding disorder, diarrhea, sore throat, fever, and rhinorrhea. Reports Dr. Oneida Alar is his GI doctor for Crohn's. Denies hx of bowel obstruction.  Past Medical History  Diagnosis Date  . Crohn's colitis     s/p total colectomy with ileostomy in 2009  . Nystagmus   . Bipolar disorder   . Asthma   . GERD (gastroesophageal reflux disease)   . Hyperlipidemia   . Insomnia   . Enterococcus UTI 2009  . 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 DVT (deep venous thrombosis) 2009    right upper extremity due to PICC  . S/P endoscopy Sept 2012    mild gastritis, small hiatal hernia, no ulcers  . Crohn's disease of small intestine Sept 2012    ileoscopy: multiple ulcers likely secondary to Crohn's  . Schizophrenia, acute   . Migraine headache   . Hernia   . Colostomy in place    Past Surgical History  Procedure Laterality Date  . Total colectomy  2009    with ileostomy at  [facility] for refractory disease   . Total shoulder replacement      bilateral  . Total hip arthroplasty      right, due to avascular necrosis from chronic prednisone  . Kidney surgery    . Appendectomy    . Eye surgery    . Esophagogastroduodenoscopy  07/2010    gastritis,  bx neg for H.Pylori  . Small bowel capsule study  08/2010    few tiny nonbleeding erosions/ulcers ?secondary to relafen or Crohn's. Entire small bowel not seen.   . Ileoscopy  06/29/2011    SLF: ulcers,multiple/small HH/mild gastritis   No family history on file. History  Substance Use Topics  . Smoking status: Current Every Day Smoker -- 1.00 packs/day    Types: Cigarettes  . Smokeless tobacco: Not on file  . Alcohol Use: No    Review of Systems  Constitutional: Negative for fever.  HENT: Negative for sore throat, rhinorrhea and neck pain.   Eyes: Negative for visual disturbance.  Respiratory: Negative for shortness of breath.   Cardiovascular: Negative for chest pain.  Gastrointestinal: Positive for nausea, vomiting, abdominal pain and constipation. Negative for diarrhea.  Genitourinary: Negative for dysuria and hematuria.  Musculoskeletal: Negative for back pain.  Skin: Negative for rash.  Hematological: Does not bruise/bleed easily.  Psychiatric/Behavioral: Negative for confusion.   A complete 10 system review of systems was obtained and all systems are negative except as noted in the HPI and PMH.  Allergies  Aspirin; Codeine; Ibuprofen; Penicillins; Remicade; Tramadol hcl; and Vicodin  Home Medications   Current Outpatient Rx  Name  Route  Sig  Dispense  Refill  . acetaminophen (TYLENOL) 500 MG tablet   Oral   Take 1,000 mg by mouth every 6 (six) hours as needed. For pain          . albuterol (PROAIR HFA) 108 (90 BASE) MCG/ACT inhaler   Inhalation   Inhale 2 puffs into the lungs every 6 (six) hours as needed for wheezing or shortness of breath.         . azaTHIOprine (IMURAN) 50 MG  tablet   Oral   Take 100 mg by mouth daily.         . benztropine (COGENTIN) 0.5 MG tablet   Oral   Take 0.5 mg by mouth daily.          . Calcium Carbonate-Vitamin D (CALCIUM 600 + D PO)   Oral   Take 2 tablets by mouth 2 (two) times daily.          . divalproex (DEPAKOTE ER) 250 MG 24 hr tablet   Oral   Take 250 mg by mouth at bedtime.          . divalproex (DEPAKOTE) 500 MG 24 hr tablet   Oral   Take 1,000 mg by mouth every evening.          . folic acid (FOLVITE) 1 MG tablet   Oral   Take 1 mg by mouth daily.           . furosemide (LASIX) 40 MG tablet   Oral   Take 40 mg by mouth daily.           Marland Kitchen gabapentin (NEURONTIN) 300 MG capsule   Oral   Take 900 mg by mouth at bedtime.          . metoprolol succinate (TOPROL-XL) 25 MG 24 hr tablet   Oral   Take 25 mg by mouth daily.          Marland Kitchen omeprazole (PRILOSEC) 20 MG capsule   Oral   Take 20 mg by mouth daily.         . potassium chloride SA (K-DUR,KLOR-CON) 20 MEQ tablet   Oral   Take 20 mEq by mouth daily.          . pravastatin (PRAVACHOL) 20 MG tablet   Oral   Take 20 mg by mouth at bedtime.          . risperidone (RISPERDAL) 4 MG tablet   Oral   Take 8 mg by mouth at bedtime.         Marland Kitchen rOPINIRole (REQUIP) 2 MG tablet   Oral   Take 2 mg by mouth at bedtime.         . sertraline (ZOLOFT) 50 MG tablet   Oral   Take 50 mg by mouth daily.         . Tamsulosin HCl (FLOMAX) 0.4 MG CAPS   Oral   Take 0.4 mg by mouth daily.           . traZODone (DESYREL) 100 MG tablet   Oral   Take 100 mg by mouth at bedtime.          BP 117/63  Pulse 89  Temp(Src) 97.9 F (36.6 C) (Oral)  Resp 20  Ht 5' 6"  (1.676 m)  Wt 225 lb (102.059 kg)  BMI 36.33 kg/m2  SpO2 93% Physical Exam  Nursing note and vitals reviewed. Constitutional: He is oriented to person, place, and time. He appears well-developed and well-nourished. No distress.  HENT:  Head: Normocephalic and  atraumatic.  Mouth/Throat: Mucous membranes are dry.  Eyes: EOM are normal.  Neck: Neck supple. No tracheal deviation present.  Cardiovascular: Normal rate, regular rhythm and normal heart sounds.   No murmur heard. Pulmonary/Chest: Effort normal and breath sounds normal. No respiratory distress.  Abdominal: Soft. Normal appearance. He exhibits distension. Bowel sounds are decreased. There is tenderness in the right upper quadrant, left upper quadrant and left lower quadrant.  Well healed surgical line incision and ileostomy looks pink and healthy   Musculoskeletal: Normal range of motion.  Neurological: He is alert and oriented to person, place, and time.  Skin: Skin is warm and dry.  Psychiatric: He has a normal mood and affect. His behavior is normal.    ED Course  Procedures (including critical care time) 5:37 PM Pulse oximetry on room air is 93%. 8:10 PM Pt informed of small bowel obstruction and agrees with plan of care for admission  8:15 PM Consulted with Dr. Tobe Sos with general surgery pt will be admitted.   Medications  0.9 %  sodium chloride infusion (not administered)  HYDROmorphone (DILAUDID) injection 1 mg (not administered)  enoxaparin (LOVENOX) injection 40 mg (not administered)  lactated ringers infusion (not administered)  HYDROmorphone (DILAUDID) injection 1 mg (not administered)  pantoprazole (PROTONIX) injection 40 mg (not administered)  ondansetron (ZOFRAN) injection 4 mg (not administered)  sodium chloride 0.9 % bolus 1,000 mL (1,000 mLs Intravenous New Bag/Given 05/31/13 1802)  ondansetron (ZOFRAN) injection 4 mg (4 mg Intravenous Given 05/31/13 1802)  HYDROmorphone (DILAUDID) injection 1 mg (1 mg Intravenous Given 05/31/13 1802)  iohexol (OMNIPAQUE) 300 MG/ML solution 50 mL (50 mLs Oral Contrast Given 05/31/13 1749)  iohexol (OMNIPAQUE) 300 MG/ML solution 100 mL (100 mLs Intravenous Contrast Given 05/31/13 1941)   Labs Review Labs Reviewed  URINALYSIS, ROUTINE  W REFLEX MICROSCOPIC - Abnormal; Notable for the following:    Specific Gravity, Urine >1.030 (*)    Ketones, ur 40 (*)    All other components within normal limits  CBC WITH DIFFERENTIAL - Abnormal; Notable for the following:    Neutrophils Relative % 88 (*)    Neutro Abs 8.8 (*)    Lymphocytes Relative 8 (*)    All other components within normal limits  COMPREHENSIVE METABOLIC PANEL - Abnormal; Notable for the following:    Chloride 95 (*)    Glucose, Bld 137 (*)    GFR calc non Af Amer 87 (*)    All other components within normal limits  LIPASE, BLOOD  BASIC METABOLIC PANEL  CBC   Imaging Review Dg Chest 2 View  05/31/2013   *RADIOLOGY REPORT*  Clinical Data: Nausea, weakness, abdominal pain, history of Crohn's and smoking  CHEST - 2 VIEW  Comparison: 11/24/2011; 12/22/2010  Findings:  Grossly unchanged cardiac silhouette and mediastinal contours. Stable positioning of support apparatus.  The lungs are hyperexpanded with flattening of bilateral hemidiaphragms and mild diffuse thickening of the interstitium.  Left lower lung heterogeneous opacities are favored to represent a prominent epicardial fat pad as demonstrated on prior abdominal CT.  No focal airspace opacity.  No pleural effusion or pneumothorax.  No evidence of edema.  Grossly unchanged bones including sequela of bilateral hemi humeral arthroplasty, incompletely evaluated.  IMPRESSION:  Hyperexpanded lungs without acute cardiopulmonary disease.   Original Report Authenticated By:  Jake Seats, MD   Ct Abdomen Pelvis W Contrast  05/31/2013   *RADIOLOGY REPORT*  Clinical Data: Abdominal cramping and nausea today, history Crohn's disease, colectomy, ileostomy, appendectomy, asthma, colitis  CT ABDOMEN AND PELVIS WITH CONTRAST  Technique:  Multidetector CT imaging of the abdomen and pelvis was performed following the standard protocol during bolus administration of intravenous contrast. Sagittal and coronal MPR images reconstructed  from axial data set.  Contrast: 13m OMNIPAQUE IOHEXOL 300 MG/ML  SOLN, 1023mOMNIPAQUE IOHEXOL 300 MG/ML  SOLN  Comparison: 03/23/2012  Findings: Minimal atelectasis at lung bases. Liver, spleen, pancreas, kidneys, and adrenal glands normal. Ostomy mid abdomen with parastomal hernia. Diffuse infiltration of fat within the hernia sac is seen extending into the abdominal mesentery, increased intraabdominally and associated with mild fluid-filled dilatation of the distal small bowel loops question mild versus partial small bowel obstruction. No definite bowel wall thickening, abscess or perforation. No free fluid, mass or adenopathy. Beam hardening artifacts right hip prosthesis obscure portions of inferior pelvis. Bladder and ureters otherwise unremarkable. Stomach normal appearance. No additional hernias or focal bone lesions.  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 Question component of small bowel obstruction with mild dilatation of fluid-filled distal small bowel loops and increased mesenteric edema versus previous exam. No evidence of perforation or abscess. Post total colectomy.   Original Report Authenticated By: MaLavonia DanaM.D.    MDM   1. Para-ileostomy hernia   2. Partial small bowel obstruction    Patient with CT findings concerning for small bowel obstruction or partial small bowel obstruction. Discussed with general surgery they will admit and observe patient's GI doctor is Dr. fiOneida AlarPatient's vomiting improved with Zofran. Still no further output the ileostomy. Patient still has distended abdomen. General surgery will do the admit orders.  I personally performed the services described in this documentation, which was scribed in my presence. The recorded information has been reviewed and is accurate.     ScMervin KungMD 05/31/13 2113

## 2013-05-31 NOTE — ED Notes (Signed)
Pt arrived by ems from [facility].  Pt has a colostomy and has not had a bm today, pt reports that he noramlly has 4-5 a day, has taken mom.  Now having nausea and ab cramping.

## 2013-05-31 NOTE — ED Notes (Signed)
Report given, pt in gown, making phone called to home aids, pt voided 225 cc yellow urine

## 2013-05-31 NOTE — Telephone Encounter (Signed)
Need Cdiff PCR done as soon as possible.  Stay hydrated, continue kaopectate as instructed on bottle. Low-residue/bland diet.  If Cdiff negative, will give Prednisone for flare.

## 2013-05-31 NOTE — ED Notes (Signed)
Voided 200 cc yellow urine

## 2013-05-31 NOTE — Telephone Encounter (Signed)
Does he have Levsin ordered?  If he is dealing with constipation now, may take dose of Miralax, but I would do this tomorrow.

## 2013-05-31 NOTE — Telephone Encounter (Signed)
Per Laban Emperor, NP, I told pt to go to the ED. He said he did not have a way. I told him to find someone, we can't do anything for him in this condition. He said OK.

## 2013-05-31 NOTE — Telephone Encounter (Signed)
Pt called and said that he has not had a BM today. He said his abdomen is distended and tight as it can be. He has vomited x 5 since we last talked. He had two scrambled eggs and sausage for breakfast. He said he is sick as a dog on his stomach. Please advise!

## 2013-05-31 NOTE — ED Notes (Signed)
Patient states that stomach stilling hurting. It hurts when he takes a deep breath.

## 2013-05-31 NOTE — Telephone Encounter (Signed)
Called pt and he said he has abdominal pain and diarrhea x 2 days. Emptied his bag x 12 yesterday. Tried Pepto, Kaopectate, and Immodium and nothing has helped. Yesterday he had one boiled egg for breakfast and later had baked lamb chops and green beans. He has been drinking lots of fluids. Vomited x 2 yesterday. No nausea or vomiting today. No blood in his stool. No fever. Please advise!

## 2013-05-31 NOTE — Telephone Encounter (Signed)
Agree 

## 2013-05-31 NOTE — Telephone Encounter (Signed)
He needs to go to the ED.  Concern for ileus/obstruction due to symptoms. Disregard recommendations for Miralax, as his report is now concerning for acute process.

## 2013-05-31 NOTE — Telephone Encounter (Signed)
Called to inform pt and he said he is constipated now and he took some milk of magnesia.  Please advise!

## 2013-06-01 DIAGNOSIS — K5090 Crohn's disease, unspecified, without complications: Secondary | ICD-10-CM

## 2013-06-01 DIAGNOSIS — K56609 Unspecified intestinal obstruction, unspecified as to partial versus complete obstruction: Secondary | ICD-10-CM

## 2013-06-01 LAB — BASIC METABOLIC PANEL
Calcium: 9.6 mg/dL (ref 8.4–10.5)
Chloride: 100 mEq/L (ref 96–112)
Creatinine, Ser: 0.85 mg/dL (ref 0.50–1.35)

## 2013-06-01 LAB — CBC
Hemoglobin: 15.6 g/dL (ref 13.0–17.0)
MCH: 32.4 pg (ref 26.0–34.0)
MCHC: 34.4 g/dL (ref 30.0–36.0)
MCV: 94.2 fL (ref 78.0–100.0)
RBC: 4.81 MIL/uL (ref 4.22–5.81)

## 2013-06-01 MED ORDER — HYDROMORPHONE HCL PF 1 MG/ML IJ SOLN
1.0000 mg | Freq: Once | INTRAMUSCULAR | Status: AC
  Administered 2013-06-01: 1 mg via INTRAVENOUS
  Filled 2013-06-01: qty 1

## 2013-06-01 MED ORDER — PROMETHAZINE HCL 25 MG/ML IJ SOLN
12.5000 mg | Freq: Four times a day (QID) | INTRAMUSCULAR | Status: DC | PRN
  Administered 2013-06-01 – 2013-06-03 (×3): 12.5 mg via INTRAVENOUS
  Filled 2013-06-01 (×3): qty 1

## 2013-06-01 NOTE — Progress Notes (Signed)
Subjective: Reevaluated patient. Pain has increased slightly since earlier evaluation this morning. Still describes it as colicky and diffuse. Nausea is about the same. He denies any fevers or chills.  Objective: Vital signs in last 24 hours: Temp:  [97.9 F (36.6 C)-98.9 F (37.2 C)] 98.9 F (37.2 C) (08/29 0448) Pulse Rate:  [86-110] 110 (08/29 0448) Resp:  [18-20] 20 (08/29 0448) BP: (98-119)/(60-79) 98/79 mmHg (08/29 0448) SpO2:  [91 %-96 %] 94 % (08/29 0448) Weight:  [102.059 kg (225 lb)] 102.059 kg (225 lb) (08/28 1712) Last BM Date: 06/01/13 (small amt stool colostomy)  Intake/Output from previous day: 08/28 0701 - 08/29 0700 In: 240 [P.O.:240] Out: 1125 [Urine:1125] Intake/Output this shift: Total I/O In: -  Out: 200 [Urine:200]  General appearance: alert, no distress and Uncomfortable GI: Soft, intermittent bowel sounds, moderate diffuse tenderness. No rebound. Ostomy remains viable and functional  Lab Results:   Recent Labs  05/31/13 1751 06/01/13 0627  WBC 10.1 10.6*  HGB 16.6 15.6  HCT 46.8 45.3  PLT 179 186   BMET  Recent Labs  05/31/13 1751 06/01/13 0627  NA 137 138  K 4.3 4.4  CL 95* 100  CO2 25 29  GLUCOSE 137* 102*  BUN 10 11  CREATININE 1.01 0.85  CALCIUM 10.1 9.6   PT/INR No results found for this basename: LABPROT, INR,  in the last 72 hours ABG No results found for this basename: PHART, PCO2, PO2, HCO3,  in the last 72 hours  Studies/Results: Dg Chest 2 View  05/31/2013   *RADIOLOGY REPORT*  Clinical Data: Nausea, weakness, abdominal pain, history of Crohn's and smoking  CHEST - 2 VIEW  Comparison: 11/24/2011; 12/22/2010  Findings:  Grossly unchanged cardiac silhouette and mediastinal contours. Stable positioning of support apparatus.  The lungs are hyperexpanded with flattening of bilateral hemidiaphragms and mild diffuse thickening of the interstitium.  Left lower lung heterogeneous opacities are favored to represent a prominent  epicardial fat pad as demonstrated on prior abdominal CT.  No focal airspace opacity.  No pleural effusion or pneumothorax.  No evidence of edema.  Grossly unchanged bones including sequela of bilateral hemi humeral arthroplasty, incompletely evaluated.  IMPRESSION:  Hyperexpanded lungs without acute cardiopulmonary disease.   Original Report Authenticated By: Jake Seats, MD   Ct Abdomen Pelvis W Contrast  05/31/2013   *RADIOLOGY REPORT*  Clinical Data: Abdominal cramping and nausea today, history Crohn's disease, colectomy, ileostomy, appendectomy, asthma, colitis  CT ABDOMEN AND PELVIS WITH CONTRAST  Technique:  Multidetector CT imaging of the abdomen and pelvis was performed following the standard protocol during bolus administration of intravenous contrast. Sagittal and coronal MPR images reconstructed from axial data set.  Contrast: 12m OMNIPAQUE IOHEXOL 300 MG/ML  SOLN, 1062mOMNIPAQUE IOHEXOL 300 MG/ML  SOLN  Comparison: 03/23/2012  Findings: Minimal atelectasis at lung bases. Liver, spleen, pancreas, kidneys, and adrenal glands normal. Ostomy mid abdomen with parastomal hernia. Diffuse infiltration of fat within the hernia sac is seen extending into the abdominal mesentery, increased intraabdominally and associated with mild fluid-filled dilatation of the distal small bowel loops question mild versus partial small bowel obstruction. No definite bowel wall thickening, abscess or perforation. No free fluid, mass or adenopathy. Beam hardening artifacts right hip prosthesis obscure portions of inferior pelvis. Bladder and ureters otherwise unremarkable. Stomach normal appearance. No additional hernias or focal bone lesions.  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 Question component of small bowel obstruction with  mild dilatation of fluid-filled distal small bowel loops and increased mesenteric edema versus previous exam. No evidence  of perforation or abscess. Post total colectomy.   Original Report Authenticated By: Lavonia Dana, M.D.    Anti-infectives: Anti-infectives   None      Assessment/Plan: s/p * No surgery found * Abdominal pain, Crohn's disease, known parastomal hernia. I again discussed with patient surgical indications. While we did discuss his level of pain we have discussed his exam findings and at this point we'll continue to monitor him extremely closely. Continue IV fluid hydration and bowel rest.  LOS: 1 day    Ryu Cerreta C 06/01/2013

## 2013-06-01 NOTE — Care Management Note (Signed)
    Page 1 of 1   06/01/2013     2:37:09 PM   CARE MANAGEMENT NOTE 06/01/2013  Patient:  [name], [name]   Account Number:  [id number]  Date Initiated:  06/01/2013  Documentation initiated by:  Theophilus Kinds  Subjective/Objective Assessment:   Pt admitted from home with partial small bowel obstruction. Pt lives alone and is independent with ADL's. Pt has a cane and walker for home use. Pt uses public transportation for transportation.     Action/Plan:   No CM needs noted.   Anticipated DC Date:  06/04/2013   Anticipated DC Plan:  [place]  CM consult      Choice offered to / List presented to:             Status of service:  Completed, signed off Medicare Important Message given?   (If response is "NO", the following Medicare IM given date fields will be blank) Date Medicare IM given:   Date Additional Medicare IM given:    Discharge Disposition:  HOME/SELF CARE  Per UR Regulation:    If discussed at Long Length of Stay Meetings, dates discussed:    Comments:  06/01/13 1405 Christinia Gully, RN BSN CM

## 2013-06-01 NOTE — Plan of Care (Signed)
Problem: Phase I Progression Outcomes Goal: OOB as tolerated unless otherwise ordered Outcome: Progressing 06/01/13 1221 Patient ambulates in room to bathroom with stand-by assist. States some dizziness and weakness at times, so has been calling for assist. Instructed not to attempt getting up on his own for safety. Call light kept within reach. bedalarm on for safety. Donavan Foil, RN

## 2013-06-01 NOTE — Progress Notes (Signed)
UR chart review completed.  

## 2013-06-01 NOTE — Progress Notes (Signed)
06/01/13 1223 Patient demonstrated correct use of incentive spirometer this morning, achieved 1500 ml volume. Discussed rationale for use and encouraged to use incentive spirometer every hour while awake as ordered. Donavan Foil, RN

## 2013-06-01 NOTE — Consult Note (Signed)
Referring Provider: Donato Heinz, MD Primary Care Physician:  [name], MD Primary Gastroenterologist:  Barney Drain, MD  Reason for Consultation:  Crohn's disease  HPI: [name] is a 47 y.o. male well-known to our practice for history of Crohn's disease. Last seen by our office in July 2014 and was doing fairly well at that time. His history is significant for total colectomy with end ileostomy by Dr. Cheryll Cockayne for refractory Crohn's colitis back in 2009. In 2011 he had an EGD and small bowel capsule endoscopy for melena and was found to have chronic gastritis, few nonbleeding small bowel erosions/tiny ulcers. At that time he was on Relafen which he been on chronically. It was not clear whether this was NSAID related or from Crohn's disease. In September 2012 he had an ileostomy that showed multiple ulcers and at that time he was placed back on maintenance therapy in the way of Pentasa. In October 2013 he was placed on Remicade, he received 2 or 3 doses but had to stop due to reaction to the medication. In January 2014 he was put on Imuran and has done well since that time with regards to Crohn's disease.  Patient states that he was doing fine until 3 days ago. He was placed on Bactrim for urinary tract infection. Within a day or 2 he started having diarrhea. He was emptying his bag up to 10 times daily. Denies melena rectal bleeding. He took various antidiarrhea medications. At this point he has not passed any stool in his colostomy in 2 days. Yesterday he started having abdominal swelling, several episodes of vomiting, abdominal pain. He states he had a fever of 102 at home. States his abdominal pain is a 10 out of 10 right now. Denies melena or hematemesis. Denies dysuria. Denies shortness or breath or chest pain. Nonproductive cough.   Prior to Admission medications   Medication Sig Start Date End Date Taking? Authorizing Provider  acetaminophen (TYLENOL) 500 MG tablet Take  1,000 mg by mouth every 6 (six) hours as needed. For pain    Yes Historical Provider, MD  albuterol (PROAIR HFA) 108 (90 BASE) MCG/ACT inhaler Inhale 2 puffs into the lungs every 6 (six) hours as needed for wheezing or shortness of breath.   Yes Historical Provider, MD  azaTHIOprine (IMURAN) 50 MG tablet Take 100 mg by mouth daily.   Yes Historical Provider, MD  benztropine (COGENTIN) 0.5 MG tablet Take 0.5 mg by mouth daily.    Yes Historical Provider, MD  Calcium Carbonate-Vitamin D (CALCIUM 600 + D PO) Take 2 tablets by mouth 2 (two) times daily.    Yes Historical Provider, MD  divalproex (DEPAKOTE ER) 250 MG 24 hr tablet Take 250 mg by mouth at bedtime.    Yes Historical Provider, MD  divalproex (DEPAKOTE) 500 MG 24 hr tablet Take 1,000 mg by mouth every evening.    Yes Historical Provider, MD  folic acid (FOLVITE) 1 MG tablet Take 1 mg by mouth daily.     Yes Historical Provider, MD  furosemide (LASIX) 40 MG tablet Take 40 mg by mouth daily.     Yes Historical Provider, MD  gabapentin (NEURONTIN) 300 MG capsule Take 900 mg by mouth at bedtime.    Yes Historical Provider, MD  metoprolol succinate (TOPROL-XL) 25 MG 24 hr tablet Take 25 mg by mouth daily.    Yes Historical Provider, MD  omeprazole (PRILOSEC) 20 MG capsule Take 20 mg by mouth daily.   Yes  Historical Provider, MD  potassium chloride SA (K-DUR,KLOR-CON) 20 MEQ tablet Take 20 mEq by mouth daily.    Yes Historical Provider, MD  pravastatin (PRAVACHOL) 20 MG tablet Take 20 mg by mouth at bedtime.    Yes Historical Provider, MD  risperidone (RISPERDAL) 4 MG tablet Take 8 mg by mouth at bedtime.   Yes Historical Provider, MD  rOPINIRole (REQUIP) 2 MG tablet Take 2 mg by mouth at bedtime.   Yes Historical Provider, MD  sertraline (ZOLOFT) 50 MG tablet Take 50 mg by mouth daily.   Yes Historical Provider, MD  Tamsulosin HCl (FLOMAX) 0.4 MG CAPS Take 0.4 mg by mouth daily.     Yes Historical Provider, MD  traZODone (DESYREL) 100 MG tablet  Take 100 mg by mouth at bedtime.   Yes Historical Provider, MD    Current Facility-Administered Medications  Medication Dose Route Frequency Provider Last Rate Last Dose  . 0.9 %  sodium chloride infusion   Intravenous Continuous Mervin Kung, MD 100 mL/hr at 05/31/13 2156    . enoxaparin (LOVENOX) injection 40 mg  40 mg Subcutaneous Q24H Donato Heinz, MD   40 mg at 05/31/13 2309  . HYDROmorphone (DILAUDID) injection 1 mg  1 mg Intravenous Q4H PRN Donato Heinz, MD   1 mg at 06/01/13 0631  . lactated ringers infusion   Intravenous Continuous Donato Heinz, MD 125 mL/hr at 05/31/13 2309    . ondansetron (ZOFRAN) injection 4 mg  4 mg Intravenous Q6H PRN Donato Heinz, MD   4 mg at 06/01/13 0418  . pantoprazole (PROTONIX) injection 40 mg  40 mg Intravenous QHS Donato Heinz, MD   40 mg at 05/31/13 2309    Allergies as of 05/31/2013 - Review Complete 05/31/2013  Allergen Reaction Noted  . Aspirin    . Codeine    . Ibuprofen    . Penicillins    . Remicade [infliximab]  04/18/2013  . Tramadol hcl    . Vicodin [hydrocodone-acetaminophen] Nausea Only 05/17/2011    Past Medical History  Diagnosis Date  . Crohn's colitis     s/p total colectomy with ileostomy in 2009  . Nystagmus   . Bipolar disorder   . Asthma   . GERD (gastroesophageal reflux disease)   . Hyperlipidemia   . Insomnia   . Enterococcus UTI 2009  . 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 DVT (deep venous thrombosis) 2009    right upper extremity due to PICC  . S/P endoscopy Sept 2012    mild gastritis, small hiatal hernia, no ulcers  . Crohn's disease of small intestine Sept 2012    ileoscopy: multiple ulcers likely secondary to Crohn's  . Schizophrenia, acute   . Migraine headache   . Hernia   . Colostomy in place     Past Surgical History  Procedure Laterality Date  . Total colectomy  2009    with ileostomy at [facility] for refractory disease   .  Total shoulder replacement      bilateral  . Total hip arthroplasty      right, due to avascular necrosis from chronic prednisone  . Kidney surgery    . Appendectomy    . Eye surgery    . Esophagogastroduodenoscopy  07/2010    gastritis,  bx neg for H.Pylori  . Small bowel capsule study  08/2010    few tiny nonbleeding erosions/ulcers ?secondary to relafen  or Crohn's. Entire small bowel not seen.   . Ileoscopy  06/29/2011    SLF: ulcers,multiple/small HH/mild gastritis    No family history on file.  History   Social History  . Marital Status: Single    Spouse Name: N/A    Number of Children: N/A  . Years of Education: N/A   Occupational History  . Not on file.   Social History Main Topics  . Smoking status: Current Every Day Smoker -- 1.00 packs/day    Types: Cigarettes  . Smokeless tobacco: Not on file  . Alcohol Use: No  . Drug Use: No  . Sexual Activity: No   Other Topics Concern  . Not on file   Social History Narrative   Girlfriend of six years, Rectal Cancer on [facility]osp[facility] (05/2011).     ROS:  General: Negative for anorexia, weight loss,chills, fatigue, weakness. See history of present illness Eyes: Negative for vision changes.  ENT: Negative for hoarseness, difficulty swallowing , nasal congestion. CV: Negative for chest pain, angina, palpitations, dyspnea on exertion, peripheral edema.  Respiratory: Negative for dyspnea at rest, dyspnea on exertion, cough, sputum, wheezing.  GI: See history of present illness. GU:  Negative for dysuria, hematuria, urinary incontinence, urinary frequency, nocturnal urination. See history of present illness MS: Negative for joint pain, low back pain.  Derm: Negative for rash or itching.  Neuro: Negative for weakness, abnormal sensation, seizure, frequent headaches, memory loss, confusion.  Psych: Negative for anxiety, depression, suicidal ideation, hallucinations.  Endo: Negative for unusual weight change.  Heme: Negative for  bruising or bleeding. Allergy: Negative for rash or hives.       Physical Examination: Vital signs in last 24 hours: Temp:  [97.9 F (36.6 C)-98.9 F (37.2 C)] 98.9 F (37.2 C) (08/29 0448) Pulse Rate:  [86-110] 110 (08/29 0448) Resp:  [18-20] 20 (08/29 0448) BP: (98-119)/(60-79) 98/79 mmHg (08/29 0448) SpO2:  [91 %-96 %] 94 % (08/29 0448) Weight:  [225 lb (102.059 kg)] 225 lb (102.059 kg) (08/28 1712) Last BM Date: 06/01/13  General: Appears uncomfortable. No acute distress.  Head: Normocephalic, atraumatic.   Eyes: Conjunctiva pink, no icterus. Mouth: Oropharyngeal mucosa moist and pink , no lesions erythema or exudate. Neck: Supple without thyromegaly, masses, or lymphadenopathy.  Lungs: Clear to auscultation bilaterally.  Heart: Regular rate and rhythm, no murmurs rubs or gallops.  Abdomen: Bowel sounds are normal, ostomy in the right abdomen with large hernia defect not manipulated due to patient's discomfort. Abdomen distended. Hyperactive bowel sounds. Ostomy bag with minimal dark green liquid stool which he reports he has not emptied in 2 days, no hepatosplenomegaly or masses, no abdominal bruits, no rebound or guarding.   Rectal: Not performed Extremities: No lower extremity edema, clubbing, deformity.  Neuro: Alert and oriented x 4 , grossly normal neurologically.  Skin: Warm and dry, no rash or jaundice.   Psych: Alert and cooperative, normal mood and affect.        Intake/Output from previous day: 08/28 0701 - 08/29 0700 In: 240 [P.O.:240] Out: 1125 [Urine:1125] Intake/Output this shift:    Lab Results: CBC  Recent Labs  05/31/13 1751 06/01/13 0627  WBC 10.1 10.6*  HGB 16.6 15.6  HCT 46.8 45.3  MCV 94.2 94.2  PLT 179 186   BMET  Recent Labs  05/31/13 1751 06/01/13 0627  NA 137 138  K 4.3 4.4  CL 95* 100  CO2 25 29  GLUCOSE 137* 102*  BUN 10 11  CREATININE 1.01 0.85  CALCIUM 10.1 9.6   LFT  Recent Labs  05/31/13 1751  BILITOT 0.6   ALKPHOS 72  AST 26  ALT 20  PROT 7.8  ALBUMIN 4.1   Lab Results  Component Value Date   LIPASE 21 05/31/2013      PT/INR No results found for this basename: LABPROT, INR,  in the last 72 hours    Imaging Studies: Dg Chest 2 View  05/31/2013   *RADIOLOGY REPORT*  Clinical Data: Nausea, weakness, abdominal pain, history of Crohn's and smoking  CHEST - 2 VIEW  Comparison: 11/24/2011; 12/22/2010  Findings:  Grossly unchanged cardiac silhouette and mediastinal contours. Stable positioning of support apparatus.  The lungs are hyperexpanded with flattening of bilateral hemidiaphragms and mild diffuse thickening of the interstitium.  Left lower lung heterogeneous opacities are favored to represent a prominent epicardial fat pad as demonstrated on prior abdominal CT.  No focal airspace opacity.  No pleural effusion or pneumothorax.  No evidence of edema.  Grossly unchanged bones including sequela of bilateral hemi humeral arthroplasty, incompletely evaluated.  IMPRESSION:  Hyperexpanded lungs without acute cardiopulmonary disease.   Original Report Authenticated By: Jake Seats, MD   Ct Abdomen Pelvis W Contrast  05/31/2013   *RADIOLOGY REPORT*  Clinical Data: Abdominal cramping and nausea today, history Crohn's disease, colectomy, ileostomy, appendectomy, asthma, colitis  CT ABDOMEN AND PELVIS WITH CONTRAST  Technique:  Multidetector CT imaging of the abdomen and pelvis was performed following the standard protocol during bolus administration of intravenous contrast. Sagittal and coronal MPR images reconstructed from axial data set.  Contrast: 24m OMNIPAQUE IOHEXOL 300 MG/ML  SOLN, 1026mOMNIPAQUE IOHEXOL 300 MG/ML  SOLN  Comparison: 03/23/2012  Findings: Minimal atelectasis at lung bases. Liver, spleen, pancreas, kidneys, and adrenal glands normal. Ostomy mid abdomen with parastomal hernia. Diffuse infiltration of fat within the hernia sac is seen extending into the abdominal mesentery, increased  intraabdominally and associated with mild fluid-filled dilatation of the distal small bowel loops question mild versus partial small bowel obstruction. No definite bowel wall thickening, abscess or perforation. No free fluid, mass or adenopathy. Beam hardening artifacts right hip prosthesis obscure portions of inferior pelvis. Bladder and ureters otherwise unremarkable. Stomach normal appearance. No additional hernias or focal bone lesions.  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 Question component of small bowel obstruction with mild dilatation of fluid-filled distal small bowel loops and increased mesenteric edema versus previous exam. No evidence of perforation or abscess. Post total colectomy.   Original Report Authenticated By: MaLavonia DanaM.D.     Impression: Patient is a very pleasant [age]ear old gentleman with complicated Crohn's disease requiring total colectomy with end ileostomy in 2009. Subsequently he has had disease affecting the small bowel and currently on Imuran. He has a known parastomal hernia which we referred back to Dr. GrCheryll Cockayneor consideration of surgery last year. No surgery was offered. Patient was doing well for the past several months until he had acute onset diarrhea several days ago after starting the antibiotic. He has had no passage of stool from his ostomy in 2 days. Abdomen became distended yesterday and he developed several episodes of vomiting. Based on CT findings it appears that he has mechanical obstruction. This is not clearly related to active Crohn's disease. Patient continues to have nausea and pain. No further vomiting at this point. Has not had any output through his back.  Plan: 1. Discussed with Dr. RoGala RomneyHe  will review the films personally. Patient likely needs to have surgical intervention as this process will be an ongoing problem in the setting of a parastomal hernia. 2. With  regards to his Crohn's disease, clinically he has done well the last several months since starting Imuran. Consider thiopurine metabolite study as an outpatient to optimize therapy.   LOS: 1 day   Neil Crouch  06/01/2013, 8:29 AM

## 2013-06-01 NOTE — H&P (Signed)
[name] is an 47 y.o. male.   Chief Complaint: Diffuse abdominal pain nausea vomiting HPI: Patient presents to [facility] emergency department with several day history of increasing diffuse intermittent abdominal pain and increasing nausea. He has stated similar symptomatology in the past. He does have an active history of Crohn's disease. He does state that he has not seen Dr. Oneida Alar in quite a while although he did contact her office prior to proceeding to the emergency department. His last bowel movements have been smaller but present. No melena or hematochezia. He has not had any hematemesis. Appetite has been poor. He denies currently being on any of his Crohn's medications. Nausea is currently somewhat improved. No emesis currently  Past Medical History  Diagnosis Date  . Crohn's colitis     s/p total colectomy with ileostomy in 2009  . Nystagmus   . Bipolar disorder   . Asthma   . GERD (gastroesophageal reflux disease)   . Hyperlipidemia   . Insomnia   . Enterococcus UTI 2009  . 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 DVT (deep venous thrombosis) 2009    right upper extremity due to PICC  . S/P endoscopy Sept 2012    mild gastritis, small hiatal hernia, no ulcers  . Crohn's disease of small intestine Sept 2012    ileoscopy: multiple ulcers likely secondary to Crohn's  . Schizophrenia, acute   . Migraine headache   . Hernia   . Colostomy in place     Past Surgical History  Procedure Laterality Date  . Total colectomy  2009    with ileostomy at [facility] for refractory disease   . Total shoulder replacement      bilateral  . Total hip arthroplasty      right, due to avascular necrosis from chronic prednisone  . Kidney surgery    . Appendectomy    . Eye surgery    . Esophagogastroduodenoscopy  07/2010    gastritis,  bx neg for H.Pylori  . Small bowel capsule study  08/2010    few tiny nonbleeding erosions/ulcers ?secondary  to relafen or Crohn's. Entire small bowel not seen.   . Ileoscopy  06/29/2011    SLF: ulcers,multiple/small HH/mild gastritis    No family history on file. Social History:  reports that he has been smoking Cigarettes.  He has been smoking about 1.00 pack per day. He does not have any smokeless tobacco history on file. He reports that he does not drink alcohol or use illicit drugs.  Allergies:  Allergies  Allergen Reactions  . Aspirin     REACTION: unknown reaction  . Codeine     REACTION: unknown reaction  . Ibuprofen     REACTION: unknown reaction  . Penicillins     REACTION: unknown reaction  . Remicade [Infliximab]     COULDN'T BREATHE  . Tramadol Hcl     REACTION: unknown reaction  . Vicodin [Hydrocodone-Acetaminophen] Nausea Only    Medications Prior to Admission  Medication Sig Dispense Refill  . acetaminophen (TYLENOL) 500 MG tablet Take 1,000 mg by mouth every 6 (six) hours as needed. For pain       . albuterol (PROAIR HFA) 108 (90 BASE) MCG/ACT inhaler Inhale 2 puffs into the lungs every 6 (six) hours as needed for wheezing or shortness of breath.      . azaTHIOprine (IMURAN) 50 MG tablet Take 100 mg by mouth daily.      Marland Kitchen  benztropine (COGENTIN) 0.5 MG tablet Take 0.5 mg by mouth daily.       . Calcium Carbonate-Vitamin D (CALCIUM 600 + D PO) Take 2 tablets by mouth 2 (two) times daily.       . divalproex (DEPAKOTE ER) 250 MG 24 hr tablet Take 250 mg by mouth at bedtime.       . divalproex (DEPAKOTE) 500 MG 24 hr tablet Take 1,000 mg by mouth every evening.       . folic acid (FOLVITE) 1 MG tablet Take 1 mg by mouth daily.        . furosemide (LASIX) 40 MG tablet Take 40 mg by mouth daily.        Marland Kitchen gabapentin (NEURONTIN) 300 MG capsule Take 900 mg by mouth at bedtime.       . metoprolol succinate (TOPROL-XL) 25 MG 24 hr tablet Take 25 mg by mouth daily.       Marland Kitchen omeprazole (PRILOSEC) 20 MG capsule Take 20 mg by mouth daily.      . potassium chloride SA (K-DUR,KLOR-CON)  20 MEQ tablet Take 20 mEq by mouth daily.       . pravastatin (PRAVACHOL) 20 MG tablet Take 20 mg by mouth at bedtime.       . risperidone (RISPERDAL) 4 MG tablet Take 8 mg by mouth at bedtime.      Marland Kitchen rOPINIRole (REQUIP) 2 MG tablet Take 2 mg by mouth at bedtime.      . sertraline (ZOLOFT) 50 MG tablet Take 50 mg by mouth daily.      . Tamsulosin HCl (FLOMAX) 0.4 MG CAPS Take 0.4 mg by mouth daily.        . traZODone (DESYREL) 100 MG tablet Take 100 mg by mouth at bedtime.        Results for orders placed during the hospital encounter of 05/31/13 (from the past 48 hour(s))  CBC WITH DIFFERENTIAL     Status: Abnormal   Collection Time    05/31/13  5:51 PM      Result Value Range   WBC 10.1  4.0 - 10.5 K/uL   RBC 4.97  4.22 - 5.81 MIL/uL   Hemoglobin 16.6  13.0 - 17.0 g/dL   HCT 46.8  39.0 - 52.0 %   MCV 94.2  78.0 - 100.0 fL   MCH 33.4  26.0 - 34.0 pg   MCHC 35.5  30.0 - 36.0 g/dL   RDW 14.2  11.5 - 15.5 %   Platelets 179  150 - 400 K/uL   Neutrophils Relative % 88 (*) 43 - 77 %   Neutro Abs 8.8 (*) 1.7 - 7.7 K/uL   Lymphocytes Relative 8 (*) 12 - 46 %   Lymphs Abs 0.8  0.7 - 4.0 K/uL   Monocytes Relative 5  3 - 12 %   Monocytes Absolute 0.5  0.1 - 1.0 K/uL   Eosinophils Relative 0  0 - 5 %   Eosinophils Absolute 0.0  0.0 - 0.7 K/uL   Basophils Relative 0  0 - 1 %   Basophils Absolute 0.0  0.0 - 0.1 K/uL  COMPREHENSIVE METABOLIC PANEL     Status: Abnormal   Collection Time    05/31/13  5:51 PM      Result Value Range   Sodium 137  135 - 145 mEq/L   Potassium 4.3  3.5 - 5.1 mEq/L   Chloride 95 (*) 96 - 112 mEq/L   CO2 25  19 - 32 mEq/L   Glucose, Bld 137 (*) 70 - 99 mg/dL   BUN 10  6 - 23 mg/dL   Creatinine, Ser 1.01  0.50 - 1.35 mg/dL   Calcium 10.1  8.4 - 10.5 mg/dL   Total Protein 7.8  6.0 - 8.3 g/dL   Albumin 4.1  3.5 - 5.2 g/dL   AST 26  0 - 37 U/L   ALT 20  0 - 53 U/L   Alkaline Phosphatase 72  39 - 117 U/L   Total Bilirubin 0.6  0.3 - 1.2 mg/dL   GFR calc non  Af Amer 87 (*) >90 mL/min   GFR calc Af Amer >90  >90 mL/min   Comment: (NOTE)     The eGFR has been calculated using the CKD EPI equation.     This calculation has not been validated in all clinical situations.     eGFR's persistently <90 mL/min signify possible Chronic Kidney     Disease.  LIPASE, BLOOD     Status: None   Collection Time    05/31/13  5:51 PM      Result Value Range   Lipase 21  11 - 59 U/L  URINALYSIS, ROUTINE W REFLEX MICROSCOPIC     Status: Abnormal   Collection Time    05/31/13  7:53 PM      Result Value Range   Color, Urine YELLOW  YELLOW   APPearance CLEAR  CLEAR   Specific Gravity, Urine >1.030 (*) 1.005 - 1.030   pH 6.0  5.0 - 8.0   Glucose, UA NEGATIVE  NEGATIVE mg/dL   Hgb urine dipstick NEGATIVE  NEGATIVE   Bilirubin Urine NEGATIVE  NEGATIVE   Ketones, ur 40 (*) NEGATIVE mg/dL   Protein, ur NEGATIVE  NEGATIVE mg/dL   Urobilinogen, UA 0.2  0.0 - 1.0 mg/dL   Nitrite NEGATIVE  NEGATIVE   Leukocytes, UA NEGATIVE  NEGATIVE   Comment: MICROSCOPIC NOT DONE ON URINES WITH NEGATIVE PROTEIN, BLOOD, LEUKOCYTES, NITRITE, OR GLUCOSE <1000 mg/dL.  BASIC METABOLIC PANEL     Status: Abnormal   Collection Time    06/01/13  6:27 AM      Result Value Range   Sodium 138  135 - 145 mEq/L   Potassium 4.4  3.5 - 5.1 mEq/L   Chloride 100  96 - 112 mEq/L   CO2 29  19 - 32 mEq/L   Glucose, Bld 102 (*) 70 - 99 mg/dL   BUN 11  6 - 23 mg/dL   Creatinine, Ser 0.85  0.50 - 1.35 mg/dL   Calcium 9.6  8.4 - 10.5 mg/dL   GFR calc non Af Amer >90  >90 mL/min   GFR calc Af Amer >90  >90 mL/min   Comment: (NOTE)     The eGFR has been calculated using the CKD EPI equation.     This calculation has not been validated in all clinical situations.     eGFR's persistently <90 mL/min signify possible Chronic Kidney     Disease.  CBC     Status: Abnormal   Collection Time    06/01/13  6:27 AM      Result Value Range   WBC 10.6 (*) 4.0 - 10.5 K/uL   RBC 4.81  4.22 - 5.81 MIL/uL    Hemoglobin 15.6  13.0 - 17.0 g/dL   HCT 45.3  39.0 - 52.0 %   MCV 94.2  78.0 - 100.0 fL   MCH 32.4  26.0 - 34.0 pg   MCHC 34.4  30.0 - 36.0 g/dL   RDW 14.5  11.5 - 15.5 %   Platelets 186  150 - 400 K/uL   Dg Chest 2 View  05/31/2013   *RADIOLOGY REPORT*  Clinical Data: Nausea, weakness, abdominal pain, history of Crohn's and smoking  CHEST - 2 VIEW  Comparison: 11/24/2011; 12/22/2010  Findings:  Grossly unchanged cardiac silhouette and mediastinal contours. Stable positioning of support apparatus.  The lungs are hyperexpanded with flattening of bilateral hemidiaphragms and mild diffuse thickening of the interstitium.  Left lower lung heterogeneous opacities are favored to represent a prominent epicardial fat pad as demonstrated on prior abdominal CT.  No focal airspace opacity.  No pleural effusion or pneumothorax.  No evidence of edema.  Grossly unchanged bones including sequela of bilateral hemi humeral arthroplasty, incompletely evaluated.  IMPRESSION:  Hyperexpanded lungs without acute cardiopulmonary disease.   Original Report Authenticated By: Jake Seats, MD   Ct Abdomen Pelvis W Contrast  05/31/2013   *RADIOLOGY REPORT*  Clinical Data: Abdominal cramping and nausea today, history Crohn's disease, colectomy, ileostomy, appendectomy, asthma, colitis  CT ABDOMEN AND PELVIS WITH CONTRAST  Technique:  Multidetector CT imaging of the abdomen and pelvis was performed following the standard protocol during bolus administration of intravenous contrast. Sagittal and coronal MPR images reconstructed from axial data set.  Contrast: 73m OMNIPAQUE IOHEXOL 300 MG/ML  SOLN, 1057mOMNIPAQUE IOHEXOL 300 MG/ML  SOLN  Comparison: 03/23/2012  Findings: Minimal atelectasis at lung bases. Liver, spleen, pancreas, kidneys, and adrenal glands normal. Ostomy mid abdomen with parastomal hernia. Diffuse infiltration of fat within the hernia sac is seen extending into the abdominal mesentery, increased intraabdominally and  associated with mild fluid-filled dilatation of the distal small bowel loops question mild versus partial small bowel obstruction. No definite bowel wall thickening, abscess or perforation. No free fluid, mass or adenopathy. Beam hardening artifacts right hip prosthesis obscure portions of inferior pelvis. Bladder and ureters otherwise unremarkable. Stomach normal appearance. No additional hernias or focal bone lesions.  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 Question component of small bowel obstruction with mild dilatation of fluid-filled distal small bowel loops and increased mesenteric edema versus previous exam. No evidence of perforation or abscess. Post total colectomy.   Original Report Authenticated By: MaLavonia DanaM.D.    Review of Systems  Constitutional: Positive for chills.  HENT: Negative.   Eyes: Negative.   Respiratory: Negative.   Cardiovascular: Negative.   Gastrointestinal: Positive for nausea, vomiting and abdominal pain. Negative for diarrhea, constipation, blood in stool and melena.  Genitourinary: Negative.   Musculoskeletal: Negative.   Skin: Negative.   Neurological: Negative.   Endo/Heme/Allergies: Negative.   Psychiatric/Behavioral: Negative.     Blood pressure 98/79, pulse 110, temperature 98.9 F (37.2 C), temperature source Oral, resp. rate 20, height 5' 6"  (1.676 m), weight 102.059 kg (225 lb), SpO2 94.00%. Physical Exam  Constitutional: He is oriented to person, place, and time. He appears well-developed and well-nourished. No distress.  HENT:  Head: Normocephalic and atraumatic.  Eyes: Conjunctivae and EOM are normal. Pupils are equal, round, and reactive to light.  Neck: Normal range of motion. Neck supple. No tracheal deviation present. No thyromegaly present.  Cardiovascular: Normal rate, regular rhythm and normal heart sounds.   Respiratory: Effort normal and breath sounds  normal.  GI: Soft. He exhibits distension. He exhibits no mass. There is tenderness (moderate diffuse). There  is no rebound and no guarding.  Ostomy with some discharge within pouch. Ostomy is viable  Lymphadenopathy:    He has no cervical adenopathy.  Neurological: He is alert and oriented to person, place, and time.  Skin: Skin is warm and dry.     Assessment/Plan Partial small bowel obstruction, Crohn's disease. At this point patient will be admitted for continued IV fluid hydration and bowel rest. Continue conservative management. His ostomy is functional study feel this is likely a partial bowel obstruction. There is no evidence of any acute surgical indications. Additionally I will have gastroenterology consult on the patient to assist with his current management as he is a patient of Dr. Oneida Alar.  Currently I do not identify any acute surgical findings. Will continue to monitor him closely. Continue bowel rest. Continue IV fluid hydration. Continue DVT prophylaxis.  Jessyka Austria C 06/01/2013, 10:42 AM

## 2013-06-01 NOTE — Consult Note (Signed)
Attending note:  Patient seen and examined. Last 2 CT scans reviewed reviewed with Dr. Thornton Papas. Findings and recommendations have been discussed with Dr. Geroge Baseman.  Patient has an impressive peristomal hernia. A rather wide defect present with a large amount of small bowel and mesentery contained within it. There is progression of mesenteric edema and notable upstream dilation of the proximal small bowel as compared to the prior study. Even though the patient has documented active neo-terminal ileal Crohn's disease, his major clinical problem at this time is the peristomal hernia.  Surgical repair needed.  Dr. Laural Golden will be on call this weekend and will be checking on [name].

## 2013-06-02 DIAGNOSIS — K5669 Other intestinal obstruction: Secondary | ICD-10-CM

## 2013-06-02 DIAGNOSIS — IMO0002 Reserved for concepts with insufficient information to code with codable children: Secondary | ICD-10-CM

## 2013-06-02 LAB — CBC
MCH: 32.9 pg (ref 26.0–34.0)
MCHC: 34.1 g/dL (ref 30.0–36.0)
RBC: 4.77 MIL/uL (ref 4.22–5.81)
RDW: 14.4 % (ref 11.5–15.5)
WBC: 9.9 10*3/uL (ref 4.0–10.5)

## 2013-06-02 LAB — BASIC METABOLIC PANEL
BUN: 15 mg/dL (ref 6–23)
Calcium: 9.3 mg/dL (ref 8.4–10.5)
Creatinine, Ser: 0.91 mg/dL (ref 0.50–1.35)
GFR calc Af Amer: >90 mL/min (ref >90)
GFR calc non Af Amer: >90 mL/min (ref >90)
Glucose, Bld: 101 mg/dL — ABNORMAL HIGH (ref 70–99)

## 2013-06-02 NOTE — Progress Notes (Signed)
Subjective. Patient continues to complain of nausea and abdominal pain. He states pain is worse today than it was yesterday. He feels more distended. He reports no output into his ileostomy. He hasn't passed flattest in over 3 days. He is also having hiccups. Objective; BP 137/89  Pulse 81  Temp(Src) 98.1 F (36.7 C) (Oral)  Resp 20  Ht 5' 6"  (1.676 m)  Wt 225 lb (102.059 kg)  BMI 36.33 kg/m2  SpO2 93% Patient is alert and having hiccups. Lungs are clear to auscultation. Abdomen is distended with normal bowel sounds. There is scant amount of greenish stool in ileostomy bag(patient states it has not been emptied in three-days). He has mild to moderate generalized tenderness in mid abdomen. Lab data; WBC 9.9, H&H 15.7 and 46; platelet count 174K. Metabolic 7 is pending. Urine output 1000 mL/ 24hr. Abdominal pelvic CT reviewed. Assessment; Small bowel obstruction most likely secondary to peristomal hernia. He may benefit from NG tube placement. Will discuss with Dr. Geroge Baseman. If he does not respond to medical therapy he will need surgical intervention.

## 2013-06-02 NOTE — Progress Notes (Signed)
Subjective: Still with some discomfort but states feels about the same. No increase in pain. Denies any increased nausea. Some hiccups today.   Objective: Vital signs in last 24 hours: Temp:  [98.1 F (36.7 C)-98.4 F (36.9 C)] 98.4 F (36.9 C) (08/30 1400) Pulse Rate:  [80-83] 83 (08/30 1400) Resp:  [20] 20 (08/30 1400) BP: (112-137)/(73-89) 112/77 mmHg (08/30 1400) SpO2:  [91 %-93 %] 91 % (08/30 1400) Last BM Date: 06/01/13 (colostomy)  Intake/Output from previous day: 08/29 0701 - 08/30 0700 In: 1375 [I.V.:1375] Out: 1000 [Urine:1000] Intake/Output this shift: Total I/O In: 0  Out: 200 [Urine:200]  General appearance: alert and no distress GI: Intermittent bowel sounds, soft, moderate distention, moderate diffuse tenderness. Ostomy is pink. Some discharge from ileostomy. No peritoneal signs.  Lab Results:   Recent Labs  06/01/13 0627 06/02/13 1006  WBC 10.6* 9.9  HGB 15.6 15.7  HCT 45.3 46.0  PLT 186 174   BMET  Recent Labs  06/01/13 0627 06/02/13 1006  NA 138 138  K 4.4 4.6  CL 100 100  CO2 29 32  GLUCOSE 102* 101*  BUN 11 15  CREATININE 0.85 0.91  CALCIUM 9.6 9.3   PT/INR No results found for this basename: LABPROT, INR,  in the last 72 hours ABG No results found for this basename: PHART, PCO2, PO2, HCO3,  in the last 72 hours  Studies/Results: Dg Chest 2 View  05/31/2013   *RADIOLOGY REPORT*  Clinical Data: Nausea, weakness, abdominal pain, history of Crohn's and smoking  CHEST - 2 VIEW  Comparison: 11/24/2011; 12/22/2010  Findings:  Grossly unchanged cardiac silhouette and mediastinal contours. Stable positioning of support apparatus.  The lungs are hyperexpanded with flattening of bilateral hemidiaphragms and mild diffuse thickening of the interstitium.  Left lower lung heterogeneous opacities are favored to represent a prominent epicardial fat pad as demonstrated on prior abdominal CT.  No focal airspace opacity.  No pleural effusion or  pneumothorax.  No evidence of edema.  Grossly unchanged bones including sequela of bilateral hemi humeral arthroplasty, incompletely evaluated.  IMPRESSION:  Hyperexpanded lungs without acute cardiopulmonary disease.   Original Report Authenticated By: Jake Seats, MD   Ct Abdomen Pelvis W Contrast  05/31/2013   *RADIOLOGY REPORT*  Clinical Data: Abdominal cramping and nausea today, history Crohn's disease, colectomy, ileostomy, appendectomy, asthma, colitis  CT ABDOMEN AND PELVIS WITH CONTRAST  Technique:  Multidetector CT imaging of the abdomen and pelvis was performed following the standard protocol during bolus administration of intravenous contrast. Sagittal and coronal MPR images reconstructed from axial data set.  Contrast: 71m OMNIPAQUE IOHEXOL 300 MG/ML  SOLN, 104mOMNIPAQUE IOHEXOL 300 MG/ML  SOLN  Comparison: 03/23/2012  Findings: Minimal atelectasis at lung bases. Liver, spleen, pancreas, kidneys, and adrenal glands normal. Ostomy mid abdomen with parastomal hernia. Diffuse infiltration of fat within the hernia sac is seen extending into the abdominal mesentery, increased intraabdominally and associated with mild fluid-filled dilatation of the distal small bowel loops question mild versus partial small bowel obstruction. No definite bowel wall thickening, abscess or perforation. No free fluid, mass or adenopathy. Beam hardening artifacts right hip prosthesis obscure portions of inferior pelvis. Bladder and ureters otherwise unremarkable. Stomach normal appearance. No additional hernias or focal bone lesions.  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 Question component of small bowel obstruction with mild dilatation of fluid-filled distal small bowel loops and increased mesenteric edema versus previous exam. No evidence of  perforation or abscess. Post total colectomy.   Original Report Authenticated By: Lavonia Dana, M.D.     Anti-infectives: Anti-infectives   None      Assessment/Plan: s/p * No surgery found * Crohn's disease, partial small bowel obstruction, parastomal hernia. At this time continue conservative measures. I have discussed with patient placement of a nasogastric tube. He does wish to hold on any placement at this time but will be agreeable should he continue to have nausea refractory to medication or should it progress. Again we discussed the pain. Continue to monitor closely. WBC count has improved. Continue to monitor.  LOS: 2 days    Harace Mccluney C 06/02/2013

## 2013-06-03 LAB — BASIC METABOLIC PANEL
Calcium: 8.9 mg/dL (ref 8.4–10.5)
GFR calc Af Amer: >90 mL/min (ref >90)
GFR calc non Af Amer: >90 mL/min (ref >90)
Glucose, Bld: 91 mg/dL (ref 70–99)
Potassium: 4 mEq/L (ref 3.5–5.1)
Sodium: 137 mEq/L (ref 135–145)

## 2013-06-03 LAB — CBC
Hemoglobin: 15.1 g/dL (ref 13.0–17.0)
MCH: 33 pg (ref 26.0–34.0)
MCHC: 34.3 g/dL (ref 30.0–36.0)
RDW: 14.4 % (ref 11.5–15.5)

## 2013-06-03 MED ORDER — HYDROMORPHONE HCL PF 1 MG/ML IJ SOLN
1.0000 mg | INTRAMUSCULAR | Status: DC | PRN
  Administered 2013-06-03 – 2013-06-05 (×10): 1 mg via INTRAVENOUS
  Filled 2013-06-03 (×10): qty 1

## 2013-06-03 NOTE — Progress Notes (Signed)
  Subjective: Pain about the same, "may be a little better".  No nausea.  No emesis.  Some ostomy output.    Objective: Vital signs in last 24 hours: Temp:  [98.3 F (36.8 C)-98.7 F (37.1 C)] 98.5 F (36.9 C) (08/31 1418) Pulse Rate:  [73-79] 73 (08/31 1418) Resp:  [20] 20 (08/31 1418) BP: (115-133)/(78-86) 133/83 mmHg (08/31 1418) SpO2:  [92 %-95 %] 92 % (08/31 1418) Last BM Date: 06/01/13  Intake/Output from previous day: 08/30 0701 - 08/31 0700 In: 3008.3 [I.V.:3008.3] Out: 200 [Urine:200] Intake/Output this shift: Total I/O In: 2860.4 [I.V.:2860.4] Out: -   General appearance: alert and no distress GI: soft, moderate tenderness.  Able to reduce parastomal hernia.  ostomy pink.  No diffuse peritoneal signs.    Lab Results:   Recent Labs  06/02/13 1006 06/03/13 0621  WBC 9.9 8.8  HGB 15.7 15.1  HCT 46.0 44.0  PLT 174 168   BMET  Recent Labs  06/02/13 1006 06/03/13 0621  NA 138 137  K 4.6 4.0  CL 100 99  CO2 32 32  GLUCOSE 101* 91  BUN 15 15  CREATININE 0.91 0.92  CALCIUM 9.3 8.9   PT/INR No results found for this basename: LABPROT, INR,  in the last 72 hours ABG No results found for this basename: PHART, PCO2, PO2, HCO3,  in the last 72 hours  Studies/Results: No results found.  Anti-infectives: Anti-infectives   None      Assessment/Plan: s/p * No surgery found * Partial small bowel obstruction.  Crohn's dz.  Parastomal hernia.  Continue conservative measures.  WBC normal.  Aferile.  No acute surgical findings.    LOS: 3 days    Barbra Miner C 06/03/2013

## 2013-06-03 NOTE — Progress Notes (Signed)
1420 - Paged Dr. Geroge Baseman.  Dr. Geroge Baseman returned page and stated that he was back from a procedure and would changing the patient's pain medication regimen shortly.

## 2013-06-03 NOTE — Progress Notes (Signed)
Subjective; Patient states he does not feel any better. He remains with intermittent nausea. He denies vomiting. He reports no output into the ileostomy. He says his abdominal pain is not any better.  Objective; BP 124/86  Pulse 74  Temp(Src) 98.3 F (36.8 C) (Oral)  Resp 20  Ht 5' 6"  (1.676 m)  Wt 225 lb (102.059 kg)  BMI 36.33 kg/m2  SpO2 93% Patient is alert and appears to be comfortable. Abdomen is distended with normal bowel sounds. No fluid or air noted in ileostomy bag. Palpation reveals generalized tenderness which is most pronounced medial to ileostomy where he has hernia. No mass palpated.  Lab data; WBC 8.8, H&H 15.1 and 44.0 and platelet count 168K Electrolytes within normal limits. BUN 15 and creatinine 0.92  Assessment; Small bowel obstruction most likely secondary to peristomal hernia and not responding to medical therapy. He opted not to have NG tube placement. He appears to be hemodynamically stable. Further recommendations per Dr. Geroge Baseman

## 2013-06-03 NOTE — Progress Notes (Signed)
5 - Dr. Geroge Baseman on unit.  Stated patient was requesting pain medication.  Made aware that pain medication was not due until 3 p.m.  Stated that he would change the order.

## 2013-06-03 NOTE — Progress Notes (Addendum)
Wrong Pt.

## 2013-06-04 ENCOUNTER — Inpatient Hospital Stay (HOSPITAL_COMMUNITY): Payer: Medicaid Other

## 2013-06-04 LAB — CBC
Hemoglobin: 15.8 g/dL (ref 13.0–17.0)
MCH: 32.4 pg (ref 26.0–34.0)
MCHC: 34.6 g/dL (ref 30.0–36.0)
Platelets: 187 10*3/uL (ref 150–400)
RBC: 4.87 MIL/uL (ref 4.22–5.81)

## 2013-06-04 LAB — BASIC METABOLIC PANEL
Calcium: 9.2 mg/dL (ref 8.4–10.5)
GFR calc non Af Amer: >90 mL/min (ref >90)
Potassium: 3.9 mEq/L (ref 3.5–5.1)
Sodium: 137 mEq/L (ref 135–145)

## 2013-06-04 MED ORDER — PHENOL 1.4 % MT LIQD
1.0000 | OROMUCOSAL | Status: DC | PRN
  Administered 2013-06-04: 1 via OROMUCOSAL
  Filled 2013-06-04: qty 177

## 2013-06-04 NOTE — Progress Notes (Signed)
#  1 F NGT placed in rt nare by Stann Mainland on first attempt. Tolerated well. Vomited while inserting tube. Placement verified by Stann Mainland and Linus Galas . Will obtain 1 view CXR for verification of placement

## 2013-06-04 NOTE — Progress Notes (Signed)
  Subjective: Pain somewhat improved.  No nausea.  No fever or chills.  Objective: Vital signs in last 24 hours: Temp:  [98.1 F (36.7 C)-98.5 F (36.9 C)] 98.1 F (36.7 C) (09/01 0458) Pulse Rate:  [67-79] 79 (09/01 0458) Resp:  [20] 20 (09/01 0458) BP: (121-132)/(70-82) 132/82 mmHg (09/01 0458) SpO2:  [91 %-94 %] 94 % (09/01 0458) Last BM Date: 06/01/13  Intake/Output from previous day: 08/31 0701 - 09/01 0700 In: 4397.9 [I.V.:4397.9] Out: 1200 [Urine:400; Emesis/NG output:800] Intake/Output this shift: Total I/O In: -  Out: 950 [Emesis/NG output:950]  General appearance: alert and no distress Resp: clear to auscultation bilaterally Cardio: regular rate and rhythm GI: Intermittant bowel sounds.  soft, distended.  Hernia palpated and "reducible".  no peritoneal signs.  ostomy functional and viable.  Actively peristalsing.   Lab Results:   Recent Labs  06/03/13 0621 06/04/13 0534  WBC 8.8 9.9  HGB 15.1 15.8  HCT 44.0 45.6  PLT 168 187   BMET  Recent Labs  06/03/13 0621 06/04/13 0534  NA 137 137  K 4.0 3.9  CL 99 99  CO2 32 28  GLUCOSE 91 92  BUN 15 14  CREATININE 0.92 0.85  CALCIUM 8.9 9.2   PT/INR No results found for this basename: LABPROT, INR,  in the last 72 hours ABG No results found for this basename: PHART, PCO2, PO2, HCO3,  in the last 72 hours  Studies/Results: Dg Chest 1 View  06/04/2013   *RADIOLOGY REPORT*  Clinical Data: Check nasogastric tube placement.  CHEST - 1 VIEW  Comparison: 05/31/2013.  Findings: Portacatheter in unchanged position.  There is a new gastric suction tube which crosses the diaphragm.  New right base opacity, partly obscuring the diaphragm.  No cardiomegaly.  Bilateral glenohumeral arthroplasty.  IMPRESSION:  1.  New nasogastric tube enters the stomach. 2.  New right lower lobe opacity which could be atelectasis, pneumonia, or aspiration.   Original Report Authenticated By: Jorje Guild     Anti-infectives: Anti-infectives   None      Assessment/Plan: s/p * No surgery found * Continue NG.  Surgical options again discussed with patient at length.  If no change in the next 24hours will proceed with exploration.  Currently no acute findings.  WBC remains normal.    LOS: 4 days    Janiyah Beery C 06/04/2013

## 2013-06-04 NOTE — Progress Notes (Addendum)
Wrong pt

## 2013-06-04 NOTE — Progress Notes (Signed)
Pt ambulated a short distance in halls.  Tolerated fair  Returned to room and is now up in chair  NG to low intermittent suction draining bile colored liquid

## 2013-06-04 NOTE — Progress Notes (Signed)
Vomited 300 ml greenish/yellow bile. Paged Dr Geroge Baseman. Pt now agrees to NGT.

## 2013-06-05 ENCOUNTER — Inpatient Hospital Stay (HOSPITAL_COMMUNITY): Payer: Medicaid Other | Admitting: Anesthesiology

## 2013-06-05 ENCOUNTER — Encounter (HOSPITAL_COMMUNITY): Payer: Self-pay | Admitting: Anesthesiology

## 2013-06-05 ENCOUNTER — Encounter (HOSPITAL_COMMUNITY): Payer: Self-pay | Admitting: *Deleted

## 2013-06-05 ENCOUNTER — Encounter (HOSPITAL_COMMUNITY)
Admission: EM | Disposition: A | Discharge status: Home / Self Care | Payer: Self-pay | Source: Home / Self Care | Attending: General Surgery

## 2013-06-05 DIAGNOSIS — K5000 Crohn's disease of small intestine without complications: Secondary | ICD-10-CM

## 2013-06-05 DIAGNOSIS — K56609 Unspecified intestinal obstruction, unspecified as to partial versus complete obstruction: Secondary | ICD-10-CM

## 2013-06-05 HISTORY — PX: LYSIS OF ADHESION: SHX5961

## 2013-06-05 HISTORY — PX: LAPAROTOMY: SHX154

## 2013-06-05 LAB — SURGICAL PCR SCREEN
MRSA, PCR: NEGATIVE
Staphylococcus aureus: NEGATIVE

## 2013-06-05 LAB — CBC
Hemoglobin: 15 g/dL (ref 13.0–17.0)
Platelets: 201 10*3/uL (ref 150–400)
RBC: 4.57 MIL/uL (ref 4.22–5.81)
WBC: 10.4 10*3/uL (ref 4.0–10.5)

## 2013-06-05 LAB — BASIC METABOLIC PANEL
CO2: 29 mEq/L (ref 19–32)
Chloride: 100 mEq/L (ref 96–112)
Glucose, Bld: 74 mg/dL (ref 70–99)
Potassium: 3.2 mEq/L — ABNORMAL LOW (ref 3.5–5.1)
Sodium: 139 mEq/L (ref 135–145)

## 2013-06-05 SURGERY — LAPAROTOMY, EXPLORATORY
Anesthesia: General | Site: Abdomen | Wound class: Clean

## 2013-06-05 MED ORDER — CIPROFLOXACIN IN D5W 400 MG/200ML IV SOLN
INTRAVENOUS | Status: DC | PRN
  Administered 2013-06-05: 400 mg via INTRAVENOUS

## 2013-06-05 MED ORDER — FENTANYL CITRATE 0.05 MG/ML IJ SOLN
INTRAMUSCULAR | Status: AC
  Filled 2013-06-05: qty 5

## 2013-06-05 MED ORDER — LACTATED RINGERS IV SOLN
INTRAVENOUS | Status: DC
  Administered 2013-06-05: 12:00:00 via INTRAVENOUS

## 2013-06-05 MED ORDER — DEXTROSE 5 % IV SOLN
INTRAVENOUS | Status: DC | PRN
  Administered 2013-06-05: 12:00:00 via INTRAVENOUS

## 2013-06-05 MED ORDER — CIPROFLOXACIN IN D5W 400 MG/200ML IV SOLN
400.0000 mg | INTRAVENOUS | Status: AC
  Administered 2013-06-05: 400 mg via INTRAVENOUS

## 2013-06-05 MED ORDER — ROCURONIUM BROMIDE 50 MG/5ML IV SOLN
INTRAVENOUS | Status: AC
  Filled 2013-06-05: qty 1

## 2013-06-05 MED ORDER — HYDROMORPHONE HCL PF 1 MG/ML IJ SOLN
0.2500 mg | INTRAMUSCULAR | Status: DC | PRN
  Administered 2013-06-05 (×4): 0.5 mg via INTRAVENOUS

## 2013-06-05 MED ORDER — POTASSIUM CHLORIDE 10 MEQ/100ML IV SOLN
INTRAVENOUS | Status: AC
  Filled 2013-06-05: qty 100

## 2013-06-05 MED ORDER — MIDAZOLAM HCL 2 MG/2ML IJ SOLN
INTRAMUSCULAR | Status: AC
  Filled 2013-06-05: qty 2

## 2013-06-05 MED ORDER — METRONIDAZOLE IN NACL 5-0.79 MG/ML-% IV SOLN
INTRAVENOUS | Status: AC
  Filled 2013-06-05: qty 100

## 2013-06-05 MED ORDER — HYDROMORPHONE HCL PF 1 MG/ML IJ SOLN
INTRAMUSCULAR | Status: AC
  Filled 2013-06-05: qty 1

## 2013-06-05 MED ORDER — LIDOCAINE HCL 1 % IJ SOLN
INTRAMUSCULAR | Status: DC | PRN
  Administered 2013-06-05: 30 mg via INTRADERMAL

## 2013-06-05 MED ORDER — HYDROMORPHONE HCL PF 1 MG/ML IJ SOLN
1.0000 mg | INTRAMUSCULAR | Status: DC | PRN
  Administered 2013-06-05 – 2013-06-06 (×4): 1 mg via INTRAVENOUS
  Filled 2013-06-05 (×4): qty 1

## 2013-06-05 MED ORDER — FENTANYL CITRATE 0.05 MG/ML IJ SOLN
INTRAMUSCULAR | Status: AC
  Filled 2013-06-05: qty 2

## 2013-06-05 MED ORDER — ALBUTEROL SULFATE HFA 108 (90 BASE) MCG/ACT IN AERS: 2.0000 | INHALATION_SPRAY | Freq: Four times a day (QID) | RESPIRATORY_TRACT | Status: DC | PRN

## 2013-06-05 MED ORDER — NEOSTIGMINE METHYLSULFATE 1 MG/ML IJ SOLN
INTRAMUSCULAR | Status: DC | PRN
  Administered 2013-06-05: 3 mg via INTRAVENOUS

## 2013-06-05 MED ORDER — ONDANSETRON HCL 4 MG/2ML IJ SOLN
INTRAMUSCULAR | Status: AC
  Filled 2013-06-05: qty 2

## 2013-06-05 MED ORDER — DEXTROSE 5 % IV SOLN
INTRAVENOUS | Status: DC | PRN
  Administered 2013-06-05: 13:00:00 via INTRAVENOUS

## 2013-06-05 MED ORDER — ONDANSETRON HCL 4 MG/2ML IJ SOLN
4.0000 mg | Freq: Once | INTRAMUSCULAR | Status: AC
  Administered 2013-06-05: 4 mg via INTRAVENOUS

## 2013-06-05 MED ORDER — SUCCINYLCHOLINE CHLORIDE 20 MG/ML IJ SOLN
INTRAMUSCULAR | Status: DC | PRN
  Administered 2013-06-05: 150 mg via INTRAVENOUS

## 2013-06-05 MED ORDER — SUCCINYLCHOLINE CHLORIDE 20 MG/ML IJ SOLN
INTRAMUSCULAR | Status: AC
  Filled 2013-06-05: qty 1

## 2013-06-05 MED ORDER — PROPOFOL 10 MG/ML IV BOLUS
INTRAVENOUS | Status: DC | PRN
  Administered 2013-06-05: 130 mg via INTRAVENOUS

## 2013-06-05 MED ORDER — POVIDONE-IODINE 10 % EX OINT
TOPICAL_OINTMENT | CUTANEOUS | Status: AC
  Filled 2013-06-05: qty 2

## 2013-06-05 MED ORDER — MIDAZOLAM HCL 2 MG/2ML IJ SOLN
1.0000 mg | INTRAMUSCULAR | Status: DC | PRN
  Administered 2013-06-05: 2 mg via INTRAVENOUS

## 2013-06-05 MED ORDER — LORAZEPAM 2 MG/ML IJ SOLN
1.0000 mg | INTRAMUSCULAR | Status: DC | PRN
  Administered 2013-06-05 – 2013-06-06 (×2): 1 mg via INTRAVENOUS
  Filled 2013-06-05 (×2): qty 1

## 2013-06-05 MED ORDER — SODIUM CHLORIDE 0.9 % IR SOLN
Status: DC | PRN
  Administered 2013-06-05 (×2): 1000 mL

## 2013-06-05 MED ORDER — FENTANYL CITRATE 0.05 MG/ML IJ SOLN
25.0000 ug | INTRAMUSCULAR | Status: DC | PRN
  Administered 2013-06-05 (×4): 50 ug via INTRAVENOUS

## 2013-06-05 MED ORDER — METRONIDAZOLE IN NACL 5-0.79 MG/ML-% IV SOLN: 500.0000 mg | INTRAVENOUS | Status: AC

## 2013-06-05 MED ORDER — METRONIDAZOLE IN NACL 5-0.79 MG/ML-% IV SOLN
INTRAVENOUS | Status: DC | PRN
  Administered 2013-06-05: 500 mg via INTRAVENOUS

## 2013-06-05 MED ORDER — FENTANYL CITRATE 0.05 MG/ML IJ SOLN
INTRAMUSCULAR | Status: DC | PRN
  Administered 2013-06-05 (×2): 100 ug via INTRAVENOUS

## 2013-06-05 MED ORDER — NEOSTIGMINE METHYLSULFATE 1 MG/ML IJ SOLN
INTRAMUSCULAR | Status: AC
  Filled 2013-06-05: qty 1

## 2013-06-05 MED ORDER — POTASSIUM CHLORIDE 10 MEQ/100ML IV SOLN
10.0000 meq | INTRAVENOUS | Status: AC
  Administered 2013-06-05 (×2): 10 meq via INTRAVENOUS
  Filled 2013-06-05: qty 100

## 2013-06-05 MED ORDER — ENOXAPARIN SODIUM 40 MG/0.4ML ~~LOC~~ SOLN
40.0000 mg | Freq: Once | SUBCUTANEOUS | Status: AC
  Administered 2013-06-05: 40 mg via SUBCUTANEOUS

## 2013-06-05 MED ORDER — LIDOCAINE HCL (PF) 1 % IJ SOLN
INTRAMUSCULAR | Status: AC
  Filled 2013-06-05: qty 5

## 2013-06-05 MED ORDER — GLYCOPYRROLATE 0.2 MG/ML IJ SOLN
INTRAMUSCULAR | Status: DC | PRN
  Administered 2013-06-05: .5 mg via INTRAVENOUS

## 2013-06-05 MED ORDER — ROCURONIUM BROMIDE 100 MG/10ML IV SOLN
INTRAVENOUS | Status: DC | PRN
  Administered 2013-06-05: 40 mg via INTRAVENOUS
  Administered 2013-06-05: 10 mg via INTRAVENOUS

## 2013-06-05 MED ORDER — CIPROFLOXACIN IN D5W 400 MG/200ML IV SOLN
INTRAVENOUS | Status: AC
  Filled 2013-06-05: qty 200

## 2013-06-05 MED ORDER — GLYCOPYRROLATE 0.2 MG/ML IJ SOLN
INTRAMUSCULAR | Status: AC
  Filled 2013-06-05: qty 2

## 2013-06-05 MED ORDER — MIDAZOLAM HCL 5 MG/5ML IJ SOLN
INTRAMUSCULAR | Status: DC | PRN
  Administered 2013-06-05: 2 mg via INTRAVENOUS

## 2013-06-05 MED ORDER — ONDANSETRON HCL 4 MG/2ML IJ SOLN: 4.0000 mg | Freq: Once | INTRAMUSCULAR | Status: DC | PRN

## 2013-06-05 MED ORDER — PROPOFOL 10 MG/ML IV EMUL
INTRAVENOUS | Status: AC
  Filled 2013-06-05: qty 20

## 2013-06-05 SURGICAL SUPPLY — 71 items
APPLIER CLIP 11 MED OPEN (CLIP)
APPLIER CLIP 13 LRG OPEN (CLIP)
APR CLP LRG 13 20 CLIP (CLIP)
APR CLP MED 11 20 MLT OPN (CLIP)
BAG HAMPER (MISCELLANEOUS) ×2 IMPLANT
BARRIER SKIN 2 3/4 (OSTOMY) ×2 IMPLANT
BARRIER SKIN OD2.25 2 3/4 FLNG (OSTOMY) IMPLANT
BRR ADH 6X5 SEPRAFILM 1 SHT (MISCELLANEOUS) ×1
BRR SKN FLT 2.75X2.25 2 PC (OSTOMY) ×1
CELLS DAT CNTRL 66122 CELL SVR (MISCELLANEOUS) IMPLANT
CHLORAPREP W/TINT 26ML (MISCELLANEOUS) ×1 IMPLANT
CLAMP POUCH DRAINAGE QUIET (OSTOMY) IMPLANT
CLIP APPLIE 11 MED OPEN (CLIP) IMPLANT
CLIP APPLIE 13 LRG OPEN (CLIP) IMPLANT
CLOTH BEACON ORANGE TIMEOUT ST (SAFETY) ×2 IMPLANT
COVER LIGHT HANDLE STERIS (MISCELLANEOUS) ×4 IMPLANT
COVER MAYO STAND XLG (DRAPE) ×1 IMPLANT
DRAPE INCISE IOBAN 66X45 STRL (DRAPES) ×1 IMPLANT
DRAPE WARM FLUID 44X44 (DRAPE) ×2 IMPLANT
DRSG OPSITE POSTOP 4X10 (GAUZE/BANDAGES/DRESSINGS) ×2 IMPLANT
DURAPREP 26ML APPLICATOR (WOUND CARE) ×1 IMPLANT
ELECT BLADE 6 FLAT ULTRCLN (ELECTRODE) ×2 IMPLANT
ELECT REM PT RETURN 9FT ADLT (ELECTROSURGICAL) ×2
ELECTRODE REM PT RTRN 9FT ADLT (ELECTROSURGICAL) ×1 IMPLANT
GLOVE BIOGEL PI IND STRL 7.0 (GLOVE) IMPLANT
GLOVE BIOGEL PI IND STRL 7.5 (GLOVE) ×1 IMPLANT
GLOVE BIOGEL PI INDICATOR 7.0 (GLOVE) ×3
GLOVE BIOGEL PI INDICATOR 7.5 (GLOVE) ×1
GLOVE ECLIPSE 6.5 STRL STRAW (GLOVE) ×1 IMPLANT
GLOVE ECLIPSE 7.0 STRL STRAW (GLOVE) ×2 IMPLANT
GLOVE EXAM NITRILE MD LF STRL (GLOVE) ×1 IMPLANT
GLOVE SS BIOGEL STRL SZ 6.5 (GLOVE) IMPLANT
GLOVE SUPERSENSE BIOGEL SZ 6.5 (GLOVE) ×3
GOWN STRL REIN XL XLG (GOWN DISPOSABLE) ×6 IMPLANT
INST SET MAJOR GENERAL (KITS) ×2 IMPLANT
KIT ROOM TURNOVER APOR (KITS) ×2 IMPLANT
MANIFOLD NEPTUNE II (INSTRUMENTS) ×2 IMPLANT
NS IRRIG 1000ML POUR BTL (IV SOLUTION) ×3 IMPLANT
PACK ABDOMINAL MAJOR (CUSTOM PROCEDURE TRAY) ×3 IMPLANT
PAD ARMBOARD 7.5X6 YLW CONV (MISCELLANEOUS) ×2 IMPLANT
POUCH OSTOMY 2 3/4  H 3804 (WOUND CARE) ×1
POUCH OSTOMY 2 3/4 H 3804 (WOUND CARE) ×1
POUCH OSTOMY 2 PC DRNBL 2.75 (WOUND CARE) IMPLANT
RELOAD LINEAR CUT PROX 55 BLUE (ENDOMECHANICALS) IMPLANT
RELOAD PROXIMATE 75MM BLUE (ENDOMECHANICALS) IMPLANT
RELOAD STAPLE 55 3.8 BLU REG (ENDOMECHANICALS) IMPLANT
RELOAD STAPLE 75 3.8 BLU REG (ENDOMECHANICALS) IMPLANT
RETRACTOR WND ALEXIS 18 MED (MISCELLANEOUS) IMPLANT
RETRACTOR WND ALEXIS 25 LRG (MISCELLANEOUS) IMPLANT
RTRCTR WOUND ALEXIS 18CM MED (MISCELLANEOUS)
RTRCTR WOUND ALEXIS 25CM LRG (MISCELLANEOUS) ×2
SEALER TISSUE G2 CVD JAW 35 (ENDOMECHANICALS) IMPLANT
SEALER TISSUE G2 CVD JAW 45CM (ENDOMECHANICALS) ×1
SEPRAFILM MEMBRANE 5X6 (MISCELLANEOUS) ×1 IMPLANT
SET BASIN LINEN APH (SET/KITS/TRAYS/PACK) ×2 IMPLANT
SPONGE GAUZE 4X4 12PLY (GAUZE/BANDAGES/DRESSINGS) ×2 IMPLANT
SPONGE LAP 18X18 X RAY DECT (DISPOSABLE) ×1 IMPLANT
STAPLER GUN LINEAR PROX 60 (STAPLE) IMPLANT
STAPLER PROXIMATE 55 BLUE (STAPLE) IMPLANT
STAPLER PROXIMATE 75MM BLUE (STAPLE) IMPLANT
STAPLER VISISTAT 35W (STAPLE) ×2 IMPLANT
SUCTION POOLE TIP (SUCTIONS) ×2 IMPLANT
SUT CHROMIC 0 SH (SUTURE) IMPLANT
SUT CHROMIC 2 0 SH (SUTURE) IMPLANT
SUT PDS AB 0 CTX 60 (SUTURE) ×4 IMPLANT
SUT SILK 2 0 (SUTURE) ×2
SUT SILK 2-0 18XBRD TIE 12 (SUTURE) ×1 IMPLANT
SUT SILK 3 0 SH CR/8 (SUTURE) ×2 IMPLANT
TAPE CLOTH SURG 4X10 WHT LF (GAUZE/BANDAGES/DRESSINGS) ×1 IMPLANT
TOWEL OR 17X26 4PK STRL BLUE (TOWEL DISPOSABLE) ×1 IMPLANT
TRAY FOLEY CATH 16FR SILVER (SET/KITS/TRAYS/PACK) ×2 IMPLANT

## 2013-06-05 NOTE — Anesthesia Preprocedure Evaluation (Addendum)
Anesthesia Evaluation  Patient identified by MRN, date of birth, ID band Patient awake    Reviewed: Allergy & Precautions, H&P , NPO status , Patient's Chart, lab work & pertinent test results  Airway Mallampati: III TM Distance: >3 FB Neck ROM: Full    Dental  (+) Edentulous Upper and Edentulous Lower   Pulmonary asthma , Current Smoker,  breath sounds clear to auscultation        Cardiovascular Rhythm:Regular Rate:Normal     Neuro/Psych  Headaches, PSYCHIATRIC DISORDERS Bipolar Disorder    GI/Hepatic GERD-  Medicated,SBO, Crohn's Dx   Endo/Other    Renal/GU      Musculoskeletal   Abdominal (+) + obese,   Peds  Hematology   Anesthesia Other Findings Chronic nystagmus   Reproductive/Obstetrics                           Anesthesia Physical Anesthesia Plan  ASA: III  Anesthesia Plan: General   Post-op Pain Management:    Induction: Intravenous, Rapid sequence and Cricoid pressure planned  Airway Management Planned: Oral ETT  Additional Equipment:   Intra-op Plan:   Post-operative Plan: Extubation in OR  Informed Consent: I have reviewed the patients History and Physical, chart, labs and discussed the procedure including the risks, benefits and alternatives for the proposed anesthesia with the patient or authorized representative who has indicated his/her understanding and acceptance.     Plan Discussed with:   Anesthesia Plan Comments:         Anesthesia Quick Evaluation

## 2013-06-05 NOTE — Anesthesia Procedure Notes (Signed)
Procedure Name: Intubation Date/Time: 06/05/2013 12:23 PM Performed by: Charmaine Downs Pre-anesthesia Checklist: Emergency Drugs available, Suction available, Patient being monitored and Patient identified Patient Re-evaluated:Patient Re-evaluated prior to inductionOxygen Delivery Method: Circle system utilized Preoxygenation: Pre-oxygenation with 100% oxygen Intubation Type: IV induction, Rapid sequence and Cricoid Pressure applied Ventilation: Mask ventilation without difficulty Laryngoscope Size: Mac and 3 Grade View: Grade I Tube type: Oral Tube size: 8.0 mm Number of attempts: 1 Airway Equipment and Method: Stylet Placement Confirmation: ETT inserted through vocal cords under direct vision,  positive ETCO2 and breath sounds checked- equal and bilateral Secured at: 22 cm Tube secured with: Tape Dental Injury: Teeth and Oropharynx as per pre-operative assessment

## 2013-06-05 NOTE — Op Note (Signed)
Patient:  [name]  DOB:  April 12, 1966  MRN:  [id number]   Preop Diagnosis:  Persistent partial small bowel obstruction  Postop Diagnosis:  The same  Procedure:  Exploratory laparotomy and lysis of adhesions  Surgeon:  Dr. Chelsea Primus  Anes:  General endotracheal  Indications:  Patient is a 47 year old male presented to [facility] with diffuse abdominal pain. Workup and evaluation was consistent for a partial small bowel obstruction. He has as history Crohn's disease and a known parastomal hernia. He was treated conservatively. Although he continued to show good response to bowel rest his GI function fail to increase. He still had episodes of colicky discomfort and pain and after a long discussion with patient risks benefits alternatives of exploratory laparotomy were discussed. Risk including but not limited to risk of bleeding, infection, bowel injury, abdominal dehiscence, intraoperative cardiac and pulmonary ventral discussed with patient. His questions and concerns are addressed the patient as consented for the planned procedure.  Procedure note:  Patient was taken to the operator is placed in supine position the or table time the general anesthetic is administered. Once patient was asleep he was endotracheally intubated by the nurse anesthetist. At this point a Foley cath is placed in standard sterile fashion by the operative staff. His abdomen is prepped with DuraPrep solution.  He's colostomy pouch is left in place and is covered with a sterile towel. A Ioban drape was then utilized to isolate the ostomy site in pouch. Drapes are then placed in standard fashion. His previous midline incision is reopened using a 10 blade scalpel after time out was performed. Additional dissection down to subcuticular tissues carried out using electrocautery down to the fascia. The fascia is entered into sharply as was the peritoneum. At this point the incision is opened both superiorly and  inferiorly. There are several abdominal adhesions noted. These are free with sharp dissection. I then making an inspection of the bowel. The bowel is distended there is collapsed loops of bowel distally. I running the bowel I was able to identify a transition point. This appeared to be a knuckle of bowel with a single band adhesion which was freed. Immediately the distal bowel began to expand. The bowel was run in its entirety from the ligament of Treitz to the ileostomy. Several other adhesions were freed using sharp dissection. I then milked the majority of the small bowel contents back into the stomach which was aspirated with good nasogastric tube. No other abnormalities were noted within the abdominal cavity. I did palpate the abdominal wall around the ileostomy site and while there was a small opening approximately 1 cm, this was snug against my finger time exploring this area. I do not feel due to the patient's current clinical picture that definitive closure or repair of the parastomal hernia was appropriate at this time. The bowel was returned back into the [place] cavity. The fascial edges were reapproximated using a oh looped PDS suture. Prior to complete closure a piece of Seprafilm was placed into the abdominal cavity to minimize incisions against the anterior abdominal wall. The deep subcuticular tissue was then rinsed with sterile saline. The skin edges were reapproximated using skin staples. At this time sterile dressing was placed after the skin was washed and dry with a moist dry towel. The drapes removed. The dressing was secured. The ileostomy pouch was actually replaced at this time. The patient was allowed to come out of general anesthetic and stretcher back in stable condition. At  the conclusion of procedure all instrument, sponge, needle counts are correct. Patient tolerated procedure extremely well.  Complications:  None apparent  EBL:  Less than 100 ML's  Specimen:  None

## 2013-06-05 NOTE — Transfer of Care (Signed)
Immediate Anesthesia Transfer of Care Note  Patient: [name]  Procedure(s) Performed: Procedure(s): EXPLORATORY LAPAROTOMY (N/A) LYSIS OF ADHESIONS (N/A)  Patient Location: PACU  Anesthesia Type:General  Level of Consciousness: awake, alert  and oriented  Airway & Oxygen Therapy: Patient Spontanous Breathing  Post-op Assessment: Report given to PACU RN  Post vital signs: Reviewed and stable  Complications: No apparent anesthesia complications

## 2013-06-05 NOTE — Progress Notes (Signed)
Subjective:  No improvement. Pain intermittent. C/o NGT pain now.  Objective: Vital signs in last 24 hours: Temp:  [97.4 F (36.3 C)-98.1 F (36.7 C)] 98.1 F (36.7 C) (09/02 0435) Pulse Rate:  [58-68] 58 (09/02 0435) Resp:  [20] 20 (09/02 0435) BP: (131-138)/(82-86) 138/82 mmHg (09/02 0435) SpO2:  [94 %-95 %] 95 % (09/02 0435) Last BM Date: 06/04/13 General:   Alert,  Well-developed, well-nourished, pleasant and cooperative in NAD Head:  Normocephalic and atraumatic. Eyes:  Sclera clear, no icterus.   Abdomen:  Soft, central abdominal tenderness, slight distention.  Hypoactive bowel sounds. No guarding or rebound.   Extremities:  Without clubbing, deformity or edema. Neurologic:  Alert and  oriented x4;  grossly normal neurologically. Skin:  Intact without significant lesions or rashes. Psych:  Alert and cooperative. Normal mood and affect.  Intake/Output from previous day: 09/01 0701 - 09/02 0700 In: 3089.6 [I.V.:3089.6] Out: 1775 [Urine:400; Emesis/NG output:1350; Stool:25] Intake/Output this shift:    Lab Results: CBC  Recent Labs  06/03/13 0621 06/04/13 0534 06/05/13 0516  WBC 8.8 9.9 10.4  HGB 15.1 15.8 15.0  HCT 44.0 45.6 42.1  MCV 96.1 93.6 92.1  PLT 168 187 201   BMET  Recent Labs  06/03/13 0621 06/04/13 0534 06/05/13 0516  NA 137 137 139  K 4.0 3.9 3.2*  CL 99 99 100  CO2 32 28 29  GLUCOSE 91 92 74  BUN 15 14 13   CREATININE 0.92 0.85 0.81  CALCIUM 8.9 9.2 8.9   LFTs No results found for this basename: BILITOT, BILIDIR, IBILI, ALKPHOS, AST, ALT, PROT, ALBUMIN,  in the last 72 hours No results found for this basename: LIPASE,  in the last 72 hours PT/INR No results found for this basename: LABPROT, INR,  in the last 72 hours    Imaging Studies: Dg Chest 1 View  06/04/2013   *RADIOLOGY REPORT*  Clinical Data: Check nasogastric tube placement.  CHEST - 1 VIEW  Comparison: 05/31/2013.  Findings: Portacatheter in unchanged position.  There is a  new gastric suction tube which crosses the diaphragm.  New right base opacity, partly obscuring the diaphragm.  No cardiomegaly.  Bilateral glenohumeral arthroplasty.  IMPRESSION:  1.  New nasogastric tube enters the stomach. 2.  New right lower lobe opacity which could be atelectasis, pneumonia, or aspiration.   Original Report Authenticated By: Jorje Guild      Assessment: 47 y/o with complicatedCrohn's disease requiring total colectomy with end ileostomy in 2009. Subsequently he has had disease affecting the small bowel and currently on Imuran. Now with SBO likely secondary to known parastomal hernia. No significant improvement. NG with 1300+cc. Only 25cc output from ostomy. Surgical intervention needed.  Plan: 1. Plan as per Dr. Geroge Baseman.    LOS: 5 days   [name]  06/05/2013, 8:05 AM

## 2013-06-05 NOTE — Anesthesia Postprocedure Evaluation (Addendum)
  Anesthesia Post-op Note  Patient: [name]  Procedure(s) Performed: Procedure(s): EXPLORATORY LAPAROTOMY (N/A) LYSIS OF ADHESIONS (N/A)  Patient Location: PACU  Anesthesia Type:General  Level of Consciousness: awake, alert  and oriented  Airway and Oxygen Therapy: Patient Spontanous Breathing and Patient connected to face mask oxygen  Post-op Pain: moderate  Post-op Assessment: Post-op Vital signs reviewed, Patient's Cardiovascular Status Stable, Respiratory Function Stable, Patent Airway and No signs of Nausea or vomiting  Post-op Vital Signs: Reviewed and stable 114/63, 64, 14, sat 100, temp. 27.0  Complications: No apparent anesthesia complications

## 2013-06-06 ENCOUNTER — Encounter (HOSPITAL_COMMUNITY): Payer: Self-pay | Admitting: General Surgery

## 2013-06-06 DIAGNOSIS — K56609 Unspecified intestinal obstruction, unspecified as to partial versus complete obstruction: Secondary | ICD-10-CM

## 2013-06-06 LAB — CBC
HCT: 50.5 % (ref 39.0–52.0)
Hemoglobin: 18 g/dL — ABNORMAL HIGH (ref 13.0–17.0)
MCHC: 35.6 g/dL (ref 30.0–36.0)
MCV: 92.8 fL (ref 78.0–100.0)
RDW: 14.2 % (ref 11.5–15.5)

## 2013-06-06 LAB — BASIC METABOLIC PANEL
BUN: 13 mg/dL (ref 6–23)
Creatinine, Ser: 0.92 mg/dL (ref 0.50–1.35)
GFR calc Af Amer: >90 mL/min (ref >90)
GFR calc non Af Amer: >90 mL/min (ref >90)
Glucose, Bld: 96 mg/dL (ref 70–99)
Potassium: 3.5 mEq/L (ref 3.5–5.1)

## 2013-06-06 MED ORDER — LEVALBUTEROL HCL 1.25 MG/0.5ML IN NEBU
INHALATION_SOLUTION | RESPIRATORY_TRACT | Status: AC
  Filled 2013-06-06: qty 0.5

## 2013-06-06 MED ORDER — HYDROMORPHONE 0.3 MG/ML IV SOLN
INTRAVENOUS | Status: DC
  Administered 2013-06-06: 1.8 mg via INTRAVENOUS
  Administered 2013-06-06: 0.3 mL via INTRAVENOUS
  Administered 2013-06-06: 4.14 mg via INTRAVENOUS
  Administered 2013-06-06: 12:00:00 via INTRAVENOUS
  Administered 2013-06-07: 5.1 mg via INTRAVENOUS
  Administered 2013-06-07: 7.67 mg via INTRAVENOUS
  Administered 2013-06-07: 7.2 mg via INTRAVENOUS
  Administered 2013-06-07: 13:00:00 via INTRAVENOUS
  Administered 2013-06-07: 0.3 mg via INTRAVENOUS
  Administered 2013-06-07: 2.1 mg via INTRAVENOUS
  Administered 2013-06-08: 3.9 mg via INTRAVENOUS
  Filled 2013-06-06 (×6): qty 25

## 2013-06-06 MED ORDER — NALOXONE HCL 0.4 MG/ML IJ SOLN: 0.4000 mg | INTRAMUSCULAR | Status: DC | PRN

## 2013-06-06 MED ORDER — DIPHENHYDRAMINE HCL 50 MG/ML IJ SOLN: 12.5000 mg | Freq: Four times a day (QID) | INTRAMUSCULAR | Status: DC | PRN

## 2013-06-06 MED ORDER — DIPHENHYDRAMINE HCL 12.5 MG/5ML PO ELIX
12.5000 mg | ORAL_SOLUTION | Freq: Four times a day (QID) | ORAL | Status: DC | PRN
  Administered 2013-06-08: 12.5 mg via ORAL
  Filled 2013-06-06: qty 5

## 2013-06-06 MED ORDER — ONDANSETRON HCL 4 MG/2ML IJ SOLN: 4.0000 mg | Freq: Four times a day (QID) | INTRAMUSCULAR | Status: DC | PRN

## 2013-06-06 MED ORDER — SODIUM CHLORIDE 0.9 % IJ SOLN: 9.0000 mL | INTRAMUSCULAR | Status: DC | PRN

## 2013-06-06 NOTE — Progress Notes (Signed)
Subjective: Denies nausea, vomiting. NG tube in place. States he "thinks" he is passing gas. No abdominal discomfort.   Objective: Vital signs in last 24 hours: Temp:  [97.5 F (36.4 C)-98.4 F (36.9 C)] 98.4 F (36.9 C) (09/03 0541) Pulse Rate:  [61-95] 86 (09/03 1140) Resp:  [13-21] 21 (09/03 1135) BP: (114-132)/(50-81) 117/79 mmHg (09/03 1140) SpO2:  [92 %-100 %] 93 % (09/03 1135) Last BM Date: 06/04/13 General:   Alert and oriented, pleasant Head:  Normocephalic and atraumatic Heart:  S1, S2 present, no murmurs noted.  Lungs: Clear to auscultation bilaterally, without wheezing, rales, or rhonchi.  Abdomen:  Bowel sounds hypoactive, mild TTP diffusely but without rebound or guarding. Surgical dressings intact, ileostomy in place with pink/red stoma and small amount of liquid stool.   Intake/Output from previous day: 09/02 0701 - 09/03 0700 In: 2326.7 [I.V.:2026.7; IV Piggyback:300] Out: 2575 [Urine:1950; Stool:25; Blood:100] Intake/Output this shift:    Lab Results:  Recent Labs  06/04/13 0534 06/05/13 0516 06/06/13 0527  WBC 9.9 10.4 15.7*  HGB 15.8 15.0 18.0*  HCT 45.6 42.1 50.5  PLT 187 201 210   BMET  Recent Labs  06/04/13 0534 06/05/13 0516 06/06/13 0527  NA 137 139 140  K 3.9 3.2* 3.5  CL 99 100 100  CO2 28 29 27   GLUCOSE 92 74 96  BUN 14 13 13   CREATININE 0.85 0.81 0.92  CALCIUM 9.2 8.9 8.7    Assessment: 47 year old male with history of complicated Crohn's disease s/p total colectomy with end ileostomy in the past, admitted with small bowel obstruction. Post-op Day #1 from exploratory laparotomy with lysis of adhesions, progressing as expected from surgical intervention. NG tube with 574m output in 24 hours and hypoactive bowel sounds. Remain NPO until bowel function returns: management per Dr. ZGeroge Baseman   Plan: Follow peripherally We will see patient again as outpatient.  AOrvil Feil ANP-BC [facility]    LOS: 6 days     06/06/2013, 10:00 am

## 2013-06-06 NOTE — Progress Notes (Signed)
UR chart review completed.  

## 2013-06-06 NOTE — Progress Notes (Signed)
1 Day Post-Op  Subjective: Pain mostly controlled. No nausea. No fevers or chills.  Objective: Vital signs in last 24 hours: Temp:  [97.5 F (36.4 C)-98.4 F (36.9 C)] 98.4 F (36.9 C) (09/03 0541) Pulse Rate:  [61-95] 86 (09/03 1140) Resp:  [13-21] 21 (09/03 1135) BP: (114-132)/(50-79) 117/79 mmHg (09/03 1140) SpO2:  [92 %-100 %] 93 % (09/03 1135) Last BM Date: 06/04/13  Intake/Output from previous day: 09/02 0701 - 09/03 0700 In: 2326.7 [I.V.:2026.7; IV Piggyback:300] Out: 2575 [Urine:1950; Stool:25; Blood:100] Intake/Output this shift:    General appearance: alert and no distress Resp: normal percussion bilaterally Cardio: regular rate and rhythm GI: Soft quiet, anticipated postoperative tenderness. Dressings are intact. Some shadowing at the midportion of the midline incisional dressing. Ostomy is functional and viable.  Lab Results:   Recent Labs  06/05/13 0516 06/06/13 0527  WBC 10.4 15.7*  HGB 15.0 18.0*  HCT 42.1 50.5  PLT 201 210   BMET  Recent Labs  06/05/13 0516 06/06/13 0527  NA 139 140  K 3.2* 3.5  CL 100 100  CO2 29 27  GLUCOSE 74 96  BUN 13 13  CREATININE 0.81 0.92  CALCIUM 8.9 8.7   PT/INR No results found for this basename: LABPROT, INR,  in the last 72 hours ABG No results found for this basename: PHART, PCO2, PO2, HCO3,  in the last 72 hours  Studies/Results: No results found.  Anti-infectives: Anti-infectives   Start     Dose/Rate Route Frequency Ordered Stop   06/05/13 0945  metroNIDAZOLE (FLAGYL) IVPB 500 mg     500 mg 100 mL/hr over 60 Minutes Intravenous On call to O.R. 06/05/13 0936 06/06/13 0559   06/05/13 0945  ciprofloxacin (CIPRO) IVPB 400 mg     400 mg 200 mL/hr over 60 Minutes Intravenous On call to O.R. 06/05/13 0936 06/05/13 1308      Assessment/Plan: s/p Procedure(s): EXPLORATORY LAPAROTOMY (N/A) LYSIS OF ADHESIONS (N/A) Continue to await for bowel function improvement. Continue increasing activity. We'll  switch to a PCA for increase patient pain control.  LOS: 6 days    Ulrich Soules C 06/06/2013

## 2013-06-07 LAB — CBC
HCT: 43.2 % (ref 39.0–52.0)
Hemoglobin: 15 g/dL (ref 13.0–17.0)
MCH: 33 pg (ref 26.0–34.0)
MCHC: 34.7 g/dL (ref 30.0–36.0)
RDW: 14.3 % (ref 11.5–15.5)

## 2013-06-07 LAB — BASIC METABOLIC PANEL
BUN: 13 mg/dL (ref 6–23)
Calcium: 8.4 mg/dL (ref 8.4–10.5)
GFR calc Af Amer: >90 mL/min (ref >90)
GFR calc non Af Amer: >90 mL/min (ref >90)
Glucose, Bld: 92 mg/dL (ref 70–99)
Sodium: 141 mEq/L (ref 135–145)

## 2013-06-07 MED ORDER — BENZTROPINE MESYLATE 1 MG PO TABS
0.5000 mg | ORAL_TABLET | Freq: Every day | ORAL | Status: DC
  Administered 2013-06-08 – 2013-06-11 (×4): 0.5 mg via ORAL
  Filled 2013-06-07 (×4): qty 1

## 2013-06-07 MED ORDER — ROPINIROLE HCL 1 MG PO TABS
2.0000 mg | ORAL_TABLET | Freq: Every day | ORAL | Status: DC
  Administered 2013-06-07 – 2013-06-10 (×4): 2 mg via ORAL
  Filled 2013-06-07 (×6): qty 2

## 2013-06-07 MED ORDER — SERTRALINE HCL 50 MG PO TABS
50.0000 mg | ORAL_TABLET | Freq: Every day | ORAL | Status: DC
  Administered 2013-06-08 – 2013-06-11 (×4): 50 mg via ORAL
  Filled 2013-06-07 (×4): qty 1

## 2013-06-07 MED ORDER — FUROSEMIDE 40 MG PO TABS
40.0000 mg | ORAL_TABLET | Freq: Every day | ORAL | Status: DC
  Administered 2013-06-08 – 2013-06-11 (×4): 40 mg via ORAL
  Filled 2013-06-07 (×4): qty 1

## 2013-06-07 MED ORDER — TAMSULOSIN HCL 0.4 MG PO CAPS
0.4000 mg | ORAL_CAPSULE | Freq: Every day | ORAL | Status: DC
  Administered 2013-06-08 – 2013-06-11 (×4): 0.4 mg via ORAL
  Filled 2013-06-07 (×4): qty 1

## 2013-06-07 MED ORDER — AZATHIOPRINE 50 MG PO TABS
100.0000 mg | ORAL_TABLET | Freq: Every day | ORAL | Status: DC
  Administered 2013-06-08 – 2013-06-11 (×4): 100 mg via ORAL
  Filled 2013-06-07 (×6): qty 2

## 2013-06-07 MED ORDER — RISPERIDONE 1 MG PO TABS
8.0000 mg | ORAL_TABLET | Freq: Every day | ORAL | Status: DC
  Administered 2013-06-07 – 2013-06-10 (×4): 8 mg via ORAL
  Filled 2013-06-07: qty 1
  Filled 2013-06-07 (×3): qty 8
  Filled 2013-06-07: qty 7

## 2013-06-07 MED ORDER — GABAPENTIN 300 MG PO CAPS
900.0000 mg | ORAL_CAPSULE | Freq: Every day | ORAL | Status: DC
  Administered 2013-06-07 – 2013-06-10 (×4): 900 mg via ORAL
  Filled 2013-06-07 (×4): qty 3

## 2013-06-07 MED ORDER — ROPINIROLE HCL 1 MG PO TABS
ORAL_TABLET | ORAL | Status: AC
  Filled 2013-06-07: qty 2

## 2013-06-07 MED ORDER — TRAZODONE HCL 50 MG PO TABS
100.0000 mg | ORAL_TABLET | Freq: Every day | ORAL | Status: DC
  Administered 2013-06-07 – 2013-06-10 (×4): 100 mg via ORAL
  Filled 2013-06-07 (×4): qty 2

## 2013-06-07 MED ORDER — DIVALPROEX SODIUM ER 500 MG PO TB24
1000.0000 mg | ORAL_TABLET | Freq: Every day | ORAL | Status: DC
  Administered 2013-06-07 – 2013-06-10 (×4): 1000 mg via ORAL
  Filled 2013-06-07 (×4): qty 2

## 2013-06-07 MED ORDER — METOPROLOL SUCCINATE ER 25 MG PO TB24
25.0000 mg | ORAL_TABLET | Freq: Every day | ORAL | Status: DC
  Administered 2013-06-08 – 2013-06-11 (×4): 25 mg via ORAL
  Filled 2013-06-07 (×4): qty 1

## 2013-06-07 NOTE — Progress Notes (Signed)
2 Days Post-Op  Subjective: Pain much better.  No nausea.    Objective: Vital signs in last 24 hours: Temp:  [98.1 F (36.7 C)-98.8 F (37.1 C)] 98.1 F (36.7 C) (09/04 2047) Pulse Rate:  [87-93] 89 (09/04 2047) Resp:  [12-20] 14 (09/04 2048) BP: (104-117)/(56-75) 117/75 mmHg (09/04 2047) SpO2:  [91 %-96 %] 96 % (09/04 2048) Last BM Date: 06/07/13  Intake/Output from previous day: 09/03 0701 - 09/04 0700 In: 3235.4 [I.V.:3235.4] Out: 885 [Urine:885] Intake/Output this shift:    General appearance: alert and no distress GI: +BS, soft, expected post operative tenderness.  Dressing intact.  Lab Results:   Recent Labs  06/06/13 0527 06/07/13 0513  WBC 15.7* 12.6*  HGB 18.0* 15.0  HCT 50.5 43.2  PLT 210 190   BMET  Recent Labs  06/06/13 0527 06/07/13 0513  NA 140 141  K 3.5 3.7  CL 100 105  CO2 27 29  GLUCOSE 96 92  BUN 13 13  CREATININE 0.92 0.74  CALCIUM 8.7 8.4   PT/INR No results found for this basename: LABPROT, INR,  in the last 72 hours ABG No results found for this basename: PHART, PCO2, PO2, HCO3,  in the last 72 hours  Studies/Results: No results found.  Anti-infectives: Anti-infectives   Start     Dose/Rate Route Frequency Ordered Stop   06/05/13 0945  metroNIDAZOLE (FLAGYL) IVPB 500 mg     500 mg 100 mL/hr over 60 Minutes Intravenous On call to O.R. 06/05/13 0936 06/06/13 0559   06/05/13 0945  ciprofloxacin (CIPRO) IVPB 400 mg     400 mg 200 mL/hr over 60 Minutes Intravenous On call to O.R. 06/05/13 0936 06/05/13 1308      Assessment/Plan: s/p Procedure(s): EXPLORATORY LAPAROTOMY (N/A) LYSIS OF ADHESIONS (N/A) Doing well.  d/c foley and ng.  increase activity.  start clears and advance as tolerated.    LOS: 7 days    [name] C 06/07/2013

## 2013-06-07 NOTE — Progress Notes (Addendum)
Subjective: Denies nausea, NG tube in place with bilious drainage to low-wall suction. Abdominal discomfort more controlled with PCA. States he is unsure if is passing flatus.   Objective: Vital signs in last 24 hours: Temp:  [98.1 F (36.7 C)-98.8 F (37.1 C)] 98.8 F (37.1 C) (09/04 0545) Pulse Rate:  [86-93] 87 (09/04 0545) Resp:  [13-21] 16 (09/04 0545) BP: (104-117)/(49-79) 107/69 mmHg (09/04 0545) SpO2:  [91 %-95 %] 92 % (09/04 0545) Last BM Date: 06/07/13 General:   Alert and oriented, pleasant Heart:  S1, S2 present, no murmurs noted.  Lungs: Clear to auscultation bilaterally, without wheezing, rales, or rhonchi.  Abdomen:  Bowel sounds present but slightly hypoactive, tender at incision site, ostomy with small amount dark brown stool, stoma pink/red Extremities:  Without clubbing or edema.   Intake/Output from previous day: 09/03 0701 - 09/04 0700 In: 3235.4 [I.V.:3235.4] Out: 885 [Urine:885] Intake/Output this shift:    Lab Results:  Recent Labs  06/05/13 0516 06/06/13 0527 06/07/13 0513  WBC 10.4 15.7* 12.6*  HGB 15.0 18.0* 15.0  HCT 42.1 50.5 43.2  PLT 201 210 190   BMET  Recent Labs  06/05/13 0516 06/06/13 0527 06/07/13 0513  NA 139 140 141  K 3.2* 3.5 3.7  CL 100 100 105  CO2 29 27 29   GLUCOSE 74 96 92  BUN 13 13 13   CREATININE 0.81 0.92 0.74  CALCIUM 8.9 8.7 8.4     Assessment: POD #2 from exploratory laparotomy with lysis of adhesions, still awaiting full return of bowel function. Pain controlled with PCA. With his history of complicated Crohn's disease, needs to be placed back on Imuran once diet advanced.   Plan: Remain NPO Resume Imuran when tolerating by mouth Continue PPI for GI prophylaxis Further post-op management per [place], ANP-BC [facility]     LOS: 7 days    06/07/2013, 7:49 AM    Attending note:  Stable postoperatively. He tells tells me the right lower quadrant pain he's been  experiencing has already improved significantly since surgery.

## 2013-06-07 NOTE — Plan of Care (Signed)
Problem: Phase II Progression Outcomes Goal: Return of bowel function (flatus, BM) IF ABDOMINAL SURGERY:  Outcome: Completed/Met Date Met:  06/07/13 Output in colostomy today.

## 2013-06-08 LAB — CBC
MCH: 32.8 pg (ref 26.0–34.0)
MCHC: 34.5 g/dL (ref 30.0–36.0)
Platelets: 182 10*3/uL (ref 150–400)
RBC: 4.63 MIL/uL (ref 4.22–5.81)

## 2013-06-08 LAB — BASIC METABOLIC PANEL
Calcium: 9.1 mg/dL (ref 8.4–10.5)
GFR calc non Af Amer: >90 mL/min (ref >90)
Potassium: 3.3 mEq/L — ABNORMAL LOW (ref 3.5–5.1)
Sodium: 138 mEq/L (ref 135–145)

## 2013-06-08 MED ORDER — BOOST / RESOURCE BREEZE PO LIQD
1.0000 | Freq: Two times a day (BID) | ORAL | Status: DC
  Administered 2013-06-08 – 2013-06-11 (×5): 1 via ORAL

## 2013-06-08 MED ORDER — PRO-STAT SUGAR FREE PO LIQD
30.0000 mL | Freq: Two times a day (BID) | ORAL | Status: DC
  Administered 2013-06-08 – 2013-06-11 (×5): 30 mL via ORAL
  Filled 2013-06-08 (×6): qty 30

## 2013-06-08 MED ORDER — HYDROCODONE-ACETAMINOPHEN 5-325 MG PO TABS
1.0000 | ORAL_TABLET | ORAL | Status: DC | PRN
Start: 2013-06-08 — End: 2013-06-11
  Administered 2013-06-08: 1 via ORAL
  Administered 2013-06-08: 2 via ORAL
  Administered 2013-06-08: 1 via ORAL
  Administered 2013-06-09 – 2013-06-10 (×5): 2 via ORAL
  Filled 2013-06-08: qty 2
  Filled 2013-06-08: qty 1
  Filled 2013-06-08 (×4): qty 2
  Filled 2013-06-08: qty 1
  Filled 2013-06-08: qty 2

## 2013-06-08 NOTE — Progress Notes (Signed)
3 Days Post-Op  Subjective: Increasing activity. No significant pain. Feels pretty good.  Objective: Vital signs in last 24 hours: Temp:  [98 F (36.7 C)-98.5 F (36.9 C)] 98 F (36.7 C) (09/05 1119) Pulse Rate:  [89-106] 89 (09/05 1119) Resp:  [8-16] 16 (09/05 1119) BP: (107-126)/(71-85) 123/85 mmHg (09/05 1119) SpO2:  [92 %-97 %] 97 % (09/05 1119) Last BM Date: 06/07/13  Intake/Output from previous day: 09/04 0701 - 09/05 0700 In: 3060.4 [I.V.:3060.4] Out: 1050 [Urine:700; Stool:350] Intake/Output this shift: Total I/O In: 1892.9 [P.O.:120; I.V.:1772.9] Out: 900 [Stool:900]  General appearance: alert and no distress GI: Positive bowel sounds, soft, expected postoperative tenderness. Incision is clean dry and intact. Staples are present. Ostomy is functional.  Lab Results:   Recent Labs  06/07/13 0513 06/08/13 0514  WBC 12.6* 7.7  HGB 15.0 15.2  HCT 43.2 44.0  PLT 190 182   BMET  Recent Labs  06/07/13 0513 06/08/13 0514  NA 141 138  K 3.7 3.3*  CL 105 101  CO2 29 28  GLUCOSE 92 93  BUN 13 13  CREATININE 0.74 0.73  CALCIUM 8.4 9.1   PT/INR No results found for this basename: LABPROT, INR,  in the last 72 hours ABG No results found for this basename: PHART, PCO2, PO2, HCO3,  in the last 72 hours  Studies/Results: No results found.  Anti-infectives: Anti-infectives   Start     Dose/Rate Route Frequency Ordered Stop   06/05/13 0945  metroNIDAZOLE (FLAGYL) IVPB 500 mg     500 mg 100 mL/hr over 60 Minutes Intravenous On call to O.R. 06/05/13 0936 06/06/13 0559   06/05/13 0945  ciprofloxacin (CIPRO) IVPB 400 mg     400 mg 200 mL/hr over 60 Minutes Intravenous On call to O.R. 06/05/13 0936 06/05/13 1308      Assessment/Plan: s/p Procedure(s): EXPLORATORY LAPAROTOMY (N/A) LYSIS OF ADHESIONS (N/A) Doing much better. We'll transition to oral pain medication today. Continue to advance diet. Possibly home today. I do realize that there is a housing  issue that if patient is not discharge today than will not be able to transfer to his assisted living until Monday.  LOS: 8 days    [place] C 06/08/2013

## 2013-06-08 NOTE — Progress Notes (Signed)
Subjective: Abdominal pain controlled. +flatus in bag. No N/V, NGT discontinued. Tolerating clear liquids.   Objective: Vital signs in last 24 hours: Temp:  [98.1 F (36.7 C)-98.5 F (36.9 C)] 98.5 F (36.9 C) (09/05 0600) Pulse Rate:  [89-106] 94 (09/05 0600) Resp:  [8-16] 14 (09/05 0744) BP: (107-126)/(71-81) 126/81 mmHg (09/05 0600) SpO2:  [92 %-96 %] 95 % (09/05 0744) Last BM Date: 06/07/13 General:   Alert and oriented, pleasant Heart:  S1, S2 present, no murmurs noted.  Lungs: Clear to auscultation bilaterally, without wheezing, rales, or rhonchi.  Abdomen:  Bowel sounds present, distended but soft, dressing intact to incision, ostomy with red stoma, air in bag, liquid stool Extremities:  Without clubbing or edema.   Intake/Output from previous day: 09/04 0701 - 09/05 0700 In: 3060.4 [I.V.:3060.4] Out: 1050 [Urine:700; Stool:350] Intake/Output this shift: Total I/O In: 1772.9 [I.V.:1772.9] Out: -   Lab Results:  Recent Labs  06/06/13 0527 06/07/13 0513 06/08/13 0514  WBC 15.7* 12.6* 7.7  HGB 18.0* 15.0 15.2  HCT 50.5 43.2 44.0  PLT 210 190 182   BMET  Recent Labs  06/06/13 0527 06/07/13 0513 06/08/13 0514  NA 140 141 138  K 3.5 3.7 3.3*  CL 100 105 101  CO2 27 29 28   GLUCOSE 96 92 93  BUN 13 13 13   CREATININE 0.92 0.74 0.73  CALCIUM 8.7 8.4 9.1     Assessment: POD #3 from exploratory laparotomy with lysis of adhesions, clinically improved and progressing well from surgery. Pain controlled with PCA, tolerating clear liquids. With his history of complicated Crohn's disease, needs to be placed back on Imuran once diet advanced.   Plan: Advance diet as tolerated Restart Imuran Follow as outpatient  Orvil Feil, ANP-BC [facility]    LOS: 8 days    06/08/2013, 8:10 AM

## 2013-06-08 NOTE — Progress Notes (Signed)
REVIEWED. CONSIDER RE-STARING IMURAN IF WOUND HEALING NOT AFFECTED ND NO INFECTION PRESENT.

## 2013-06-08 NOTE — Progress Notes (Signed)
INITIAL NUTRITION ASSESSMENT  DOCUMENTATION CODES Per approved criteria  -Obesity Class II   INTERVENTION:  Resource Breeze po BID, each supplement provides 250 kcal and 9 grams of protein.  ProStat 30 ml BID (each 30 ml provides 100 kcal, 15 gr protein)  Recommend obtain current wt for evaluation of significant change  NUTRITION DIAGNOSIS: Inadequate oral intake related to limited ability to eat as evidenced by NPO/Clear liquids day 8.   Goal: Pt to meet >/= 90% of their estimated nutrition needs   Monitor:  Po intake, labs and wt trends  Reason for Assessment: Length of stay (day 8)  47 y.o. male  ASSESSMENT: Patient Active Problem List   Diagnosis Date Noted  . Arthritis of knee, degenerative 02/14/2013  . Anal or rectal pain 01/10/2013  . Para-ileostomy hernia 03/29/2012  . Ileostomy dysfunction 10/14/2011  . Crohn's disease of small intestine 07/19/2011  . Hepatic steatosis 07/19/2011  . MELENA 07/15/2010  . CROHN'S DISEASE, LARGE INTESTINE 02/12/2009  . HYPERLIPIDEMIA 02/11/2009  . BIPOLAR AFFECTIVE DISORDER 02/11/2009  . NYSTAGMUS 02/11/2009  . HYPOTENSION, ORTHOSTATIC 02/11/2009  . ASTHMA 02/11/2009  . GERD 02/11/2009  . ARTHRITIS 02/11/2009  . AVASCULAR NECROSIS 02/11/2009  . INSOMNIA 02/11/2009  . PULMONARY EMBOLISM, HX OF 02/11/2009  . DEEP VENOUS THROMBOPHLEBITIS, HX OF 02/11/2009   Pt hx hx of Chron's dz and is s/p exploratory laparotomy and lysis of adhesions related to persistent partial small bowel obstruction. Diet advanced to clears. Appetite good.  Wt hx variable related to fluid changes?  Height: Ht Readings from Last 1 Encounters:  05/31/13 5' 6"  (1.676 m)    Weight status: Wt Readings from Last 1 Encounters:  05/31/13 225 lb (102.059 kg)  Admit wt.-(05/31/13) 225#  Ideal Body Weight: 142# (64.5 kg)  % Ideal Body Weight: 158%  Wt Readings from Last 10 Encounters:  05/31/13 225 lb (102.059 kg)  05/31/13 225 lb (102.059 kg)   04/18/13 223 lb (101.152 kg)  02/14/13 208 lb (94.348 kg)  01/10/13 203 lb 9.6 oz (92.352 kg)  10/18/12 212 lb 12.8 oz (96.525 kg)  08/28/12 200 lb (90.719 kg)  07/26/12 205 lb 12.8 oz (93.35 kg)  05/15/12 195 lb 6.4 oz (88.633 kg)  04/11/12 200 lb (90.719 kg)    Usual Body Weight: 210-220#  % Usual Body Weight: 102%  BMI:  Body mass index is 36.33 kg/(m^2).obesity class II  Estimated Nutritional Needs: Kcal: 3354-5625 Protein: 98-110 gr Fluid: >2300 ml/day  Skin: abdominal incision (06/05/13)   Diet Order: Clear Liquid  EDUCATION NEEDS: -No education needs identified at this time   Intake/Output Summary (Last 24 hours) at 06/08/13 0813 Last data filed at 06/08/13 0744  Gross per 24 hour  Intake 4833.34 ml  Output   1050 ml  Net 3783.34 ml  Net since admission (06/01/13) +12.6 liters  Last BM: 06/07/13 abdomen distended, nausea, ileostomy pouch RUQ  Labs:   Recent Labs Lab 06/06/13 0527 06/07/13 0513 06/08/13 0514  NA 140 141 138  K 3.5 3.7 3.3*  CL 100 105 101  CO2 27 29 28   BUN 13 13 13   CREATININE 0.92 0.74 0.73  CALCIUM 8.7 8.4 9.1  GLUCOSE 96 92 93    CBG (last 3)  No results found for this basename: GLUCAP,  in the last 72 hours  Scheduled Meds: . azaTHIOprine  100 mg Oral Daily  . benztropine  0.5 mg Oral Daily  . divalproex  1,000 mg Oral QHS  . enoxaparin (LOVENOX) injection  40 mg Subcutaneous Q24H  . furosemide  40 mg Oral Daily  . gabapentin  900 mg Oral QHS  . HYDROmorphone PCA 0.3 mg/mL   Intravenous Q4H  . metoprolol succinate  25 mg Oral Daily  . pantoprazole (PROTONIX) IV  40 mg Intravenous QHS  . risperidone  8 mg Oral QHS  . rOPINIRole  2 mg Oral QHS  . sertraline  50 mg Oral Daily  . tamsulosin  0.4 mg Oral Daily  . traZODone  100 mg Oral QHS    Continuous Infusions: . lactated ringers 125 mL/hr at 06/08/13 9030    Past Medical History  Diagnosis Date  . Crohn's colitis     s/p total colectomy with ileostomy in 2009   . Nystagmus   . Bipolar disorder   . Asthma   . GERD (gastroesophageal reflux disease)   . Hyperlipidemia   . Insomnia   . Enterococcus UTI 2009  . 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 DVT (deep venous thrombosis) 2009    right upper extremity due to PICC  . S/P endoscopy Sept 2012    mild gastritis, small hiatal hernia, no ulcers  . Crohn's disease of small intestine Sept 2012    ileoscopy: multiple ulcers likely secondary to Crohn's  . Schizophrenia, acute   . Migraine headache   . Hernia   . Colostomy in place     Past Surgical History  Procedure Laterality Date  . Total colectomy  2009    with ileostomy at [facility] for refractory disease   . Total shoulder replacement      bilateral  . Total hip arthroplasty      right, due to avascular necrosis from chronic prednisone  . Kidney surgery    . Appendectomy    . Eye surgery    . Esophagogastroduodenoscopy  07/2010    gastritis,  bx neg for H.Pylori  . Small bowel capsule study  08/2010    few tiny nonbleeding erosions/ulcers ?secondary to relafen or Crohn's. Entire small bowel not seen.   . Ileoscopy  06/29/2011    SLF: ulcers,multiple/small HH/mild gastritis  . Laparotomy N/A 06/05/2013    Procedure: EXPLORATORY LAPAROTOMY;  Surgeon: Donato Heinz, MD;  Location: [facility];  Service: General;  Laterality: N/A;  . Lysis of adhesion N/A 06/05/2013    Procedure: LYSIS OF ADHESIONS;  Surgeon: Donato Heinz, MD;  Location: [facility];  Service: General;  Laterality: N/A;    Colman Cater MS,RD,LDN,CSG Office: [phone] Pager: [phone]

## 2013-06-09 LAB — BASIC METABOLIC PANEL
CO2: 30 mEq/L (ref 19–32)
Chloride: 101 mEq/L (ref 96–112)
Potassium: 3 mEq/L — ABNORMAL LOW (ref 3.5–5.1)
Sodium: 140 mEq/L (ref 135–145)

## 2013-06-09 LAB — CBC
Platelets: 218 10*3/uL (ref 150–400)
RBC: 4.55 MIL/uL (ref 4.22–5.81)
WBC: 7.5 10*3/uL (ref 4.0–10.5)

## 2013-06-09 MED ORDER — POTASSIUM CHLORIDE 10 MEQ/100ML IV SOLN
10.0000 meq | INTRAVENOUS | Status: AC
  Administered 2013-06-09 (×2): 10 meq via INTRAVENOUS

## 2013-06-09 MED ORDER — POTASSIUM CHLORIDE CRYS ER 20 MEQ PO TBCR
20.0000 meq | EXTENDED_RELEASE_TABLET | Freq: Two times a day (BID) | ORAL | Status: DC
  Administered 2013-06-09 – 2013-06-11 (×5): 20 meq via ORAL
  Filled 2013-06-09 (×5): qty 1

## 2013-06-09 MED ORDER — POTASSIUM CHLORIDE 10 MEQ/100ML IV SOLN
10.0000 meq | INTRAVENOUS | Status: AC
  Administered 2013-06-09 (×2): 10 meq via INTRAVENOUS
  Filled 2013-06-09: qty 400

## 2013-06-09 NOTE — Progress Notes (Signed)
4 Days Post-Op  Subjective: Tolerating full liquid diet well. No complaint of pain.  Objective: Vital signs in last 24 hours: Temp:  [97.6 F (36.4 C)-98.5 F (36.9 C)] 97.6 F (36.4 C) (09/06 0539) Pulse Rate:  [88-98] 89 (09/06 0539) Resp:  [16-18] 18 (09/06 0539) BP: (123-142)/(60-85) 142/85 mmHg (09/06 0539) SpO2:  [90 %-97 %] 94 % (09/06 0539) Last BM Date: 06/08/13  Intake/Output from previous day: 09/05 0701 - 09/06 0700 In: 5503.3 [P.O.:845; I.V.:4658.3] Out: 3950 [Stool:3950] Intake/Output this shift:    General appearance: alert, cooperative and no distress Resp: clear to auscultation bilaterally Cardio: regular rate and rhythm, S1, S2 normal, no murmur, click, rub or gallop GI: Soft. Ostomy appliance leaking stool over wound. Incision otherwise healing well without evidence of infection. Bowel sounds active.  Lab Results:   Recent Labs  06/08/13 0514 06/09/13 0634  WBC 7.7 7.5  HGB 15.2 15.4  HCT 44.0 42.1  PLT 182 218   BMET  Recent Labs  06/08/13 0514 06/09/13 0634  NA 138 140  K 3.3* 3.0*  CL 101 101  CO2 28 30  GLUCOSE 93 98  BUN 13 9  CREATININE 0.73 0.69  CALCIUM 9.1 8.7   PT/INR No results found for this basename: LABPROT, INR,  in the last 72 hours  Studies/Results: No results found.  Anti-infectives: Anti-infectives   Start     Dose/Rate Route Frequency Ordered Stop   06/05/13 0945  metroNIDAZOLE (FLAGYL) IVPB 500 mg     500 mg 100 mL/hr over 60 Minutes Intravenous On call to O.R. 06/05/13 0936 06/06/13 0559   06/05/13 0945  ciprofloxacin (CIPRO) IVPB 400 mg     400 mg 200 mL/hr over 60 Minutes Intravenous On call to O.R. 06/05/13 0936 06/05/13 1308      Assessment/Plan: s/p Procedure(s): EXPLORATORY LAPAROTOMY LYSIS OF ADHESIONS Impression: Bowel function has returned. Does have hypokalemia which will be addressed. Ostomy appliance will be replaced. Advance to regular diet. Anticipate discharge in next 24-48 hours. Will  discuss with social services concerning disposition.  LOS: 9 days    Idalys Konecny A 06/09/2013

## 2013-06-10 LAB — BASIC METABOLIC PANEL
CO2: 29 mEq/L (ref 19–32)
Chloride: 103 mEq/L (ref 96–112)
GFR calc non Af Amer: >90 mL/min (ref >90)
Glucose, Bld: 98 mg/dL (ref 70–99)
Potassium: 3.1 mEq/L — ABNORMAL LOW (ref 3.5–5.1)
Sodium: 140 mEq/L (ref 135–145)

## 2013-06-10 MED ORDER — SODIUM CHLORIDE 0.9 % IV SOLN
INTRAVENOUS | Status: DC
  Administered 2013-06-10: 10:00:00 via INTRAVENOUS
  Administered 2013-06-11: 20 mL/h via INTRAVENOUS

## 2013-06-10 MED ORDER — POTASSIUM CHLORIDE 10 MEQ/100ML IV SOLN
INTRAVENOUS | Status: AC
  Filled 2013-06-10: qty 100

## 2013-06-10 MED ORDER — POTASSIUM CHLORIDE 10 MEQ/100ML IV SOLN
INTRAVENOUS | Status: AC
  Administered 2013-06-10: 10 meq via INTRAVENOUS
  Filled 2013-06-10: qty 100

## 2013-06-10 MED ORDER — POTASSIUM CHLORIDE 10 MEQ/100ML IV SOLN
10.0000 meq | INTRAVENOUS | Status: AC
  Administered 2013-06-10 (×5): 10 meq via INTRAVENOUS
  Filled 2013-06-10 (×3): qty 100

## 2013-06-10 NOTE — Progress Notes (Signed)
5 Days Post-Op  Subjective: No complaints. Tolerating regular diet well.  Objective: Vital signs in last 24 hours: Temp:  [98.1 F (36.7 C)-98.4 F (36.9 C)] 98.4 F (36.9 C) (09/07 0603) Pulse Rate:  [83-96] 83 (09/07 0603) Resp:  [18-20] 20 (09/07 0603) BP: (112-135)/(57-71) 122/57 mmHg (09/07 0603) SpO2:  [94 %-97 %] 95 % (09/07 0603) Last BM Date: 06/10/13  Intake/Output from previous day: 09/06 0701 - 09/07 0700 In: 1783.7 [P.O.:800; I.V.:783.7; IV Piggyback:200] Out: 1800 [Urine:600; Stool:1200] Intake/Output this shift:    General appearance: alert, cooperative and no distress Resp: clear to auscultation bilaterally Cardio: regular rate and rhythm, S1, S2 normal, no murmur, click, rub or gallop GI: Soft. Ostomy patent. Small erythematous area on midline incision which is not draining any purulent fluid. Incision line otherwise healing well. Active bowel sounds appreciated.  Lab Results:   Recent Labs  06/08/13 0514 06/09/13 0634  WBC 7.7 7.5  HGB 15.2 15.4  HCT 44.0 42.1  PLT 182 218   BMET  Recent Labs  06/09/13 0634 06/10/13 0644  NA 140 140  K 3.0* 3.1*  CL 101 103  CO2 30 29  GLUCOSE 98 98  BUN 9 9  CREATININE 0.69 0.66  CALCIUM 8.7 8.9   PT/INR No results found for this basename: LABPROT, INR,  in the last 72 hours  Studies/Results: No results found.  Anti-infectives: Anti-infectives   Start     Dose/Rate Route Frequency Ordered Stop   06/05/13 0945  metroNIDAZOLE (FLAGYL) IVPB 500 mg     500 mg 100 mL/hr over 60 Minutes Intravenous On call to O.R. 06/05/13 0936 06/06/13 0559   06/05/13 0945  ciprofloxacin (CIPRO) IVPB 400 mg     400 mg 200 mL/hr over 60 Minutes Intravenous On call to O.R. 06/05/13 0936 06/05/13 1308      Assessment/Plan: s/p Procedure(s): EXPLORATORY LAPAROTOMY LYSIS OF ADHESIONS Impression: Continues to progress well. Still with hypokalemia which is being addressed. Discharge planning in progress. Anticipate  discharge in next 24-48 hours.  LOS: 10 days    Emera Bussie A 06/10/2013

## 2013-06-10 NOTE — Plan of Care (Signed)
Problem: Phase III Progression Outcomes Goal: Activity at appropriate level-compared to baseline (UP IN CHAIR FOR HEMODIALYSIS)  Outcome: Completed/Met Date Met:  06/10/13 Patient walked hallway and was up in chair during the night. Tolerated well. Will continue to monitor.

## 2013-06-11 DIAGNOSIS — K56609 Unspecified intestinal obstruction, unspecified as to partial versus complete obstruction: Secondary | ICD-10-CM

## 2013-06-11 DIAGNOSIS — K5090 Crohn's disease, unspecified, without complications: Secondary | ICD-10-CM

## 2013-06-11 LAB — BASIC METABOLIC PANEL
CO2: 26 mEq/L (ref 19–32)
Chloride: 106 mEq/L (ref 96–112)
Glucose, Bld: 92 mg/dL (ref 70–99)
Potassium: 3.3 mEq/L — ABNORMAL LOW (ref 3.5–5.1)
Sodium: 143 mEq/L (ref 135–145)

## 2013-06-11 MED ORDER — METRONIDAZOLE 500 MG PO TABS: 500.0000 mg | ORAL_TABLET | Freq: Three times a day (TID) | ORAL | Status: DC

## 2013-06-11 MED ORDER — METRONIDAZOLE 500 MG PO TABS
500.0000 mg | ORAL_TABLET | Freq: Three times a day (TID) | ORAL | Status: DC
  Administered 2013-06-11: 500 mg via ORAL
  Filled 2013-06-11: qty 1

## 2013-06-11 MED ORDER — HYDROCODONE-ACETAMINOPHEN 5-325 MG PO TABS: 1.0000 | ORAL_TABLET | ORAL | Status: DC | PRN

## 2013-06-11 MED ORDER — LEVOFLOXACIN 500 MG PO TABS
500.0000 mg | ORAL_TABLET | Freq: Every day | ORAL | Status: DC
  Administered 2013-06-11: 500 mg via ORAL
  Filled 2013-06-11: qty 1

## 2013-06-11 MED ORDER — LEVOFLOXACIN 500 MG PO TABS: 500.0000 mg | ORAL_TABLET | Freq: Every day | ORAL | Status: DC

## 2013-06-11 NOTE — Progress Notes (Signed)
Patient states understanding of discharge instruction,prescriptions given.

## 2013-06-11 NOTE — Discharge Summary (Signed)
Physician Discharge Summary  Patient ID: [name] MRN: [id number] DOB/AGE: Jan 26, 1966 47 y.o.  Admit date: 05/31/2013 Discharge date: 06/11/2013  Admission Diagnoses: Partial small bowel obstruction  Discharge Diagnoses: The same, Crohn's Active Problems:   * No active hospital problems. *   Discharged Condition: stable  Hospital Course: Patient presented with signs and symptoms of a partial small bowel obstruction. He was treated initially conservatively with however he failed to progress. He continued having emesis despite some ileostomy function. He was eventually taken to the operating room for exploratory laparotomy was done had a single band adhesion causing his symptomatology. He tolerated procedure well.  Once bowel function returned he was advanced on a diet. He continued to tolerate this well. He has demonstrated some erythema around the midline incision after some stool spillage from his ileostomy. He is now being started back on a course of oral antibiotics and will be discharged on a course of the oral antibiotics. Otherwise she is doing extremely well. Patient will call with problems or concerns.  Consults: GI  Significant Diagnostic Studies: labs: Daily  Treatments: surgery: Exploratory laparotomy, lysis of adhesions  Discharge Exam: Blood pressure 115/71, pulse 90, temperature 99 F (37.2 C), temperature source Oral, resp. rate 20, height 5' 6"  (1.676 m), weight 102.059 kg (225 lb), SpO2 99.00%. General appearance: alert and no distress Resp: clear to auscultation bilaterally Cardio: regular rate and rhythm GI: Positive bowel sounds, soft, expected postoperative tenderness. Incision is clean dry and intact other there is some erythema around the midportion of the incision. No discharge or purulence is noted. Ileostomy is viable and functional.  Disposition: 01-Home or Self Care  Discharge Orders   Future Orders Complete By Expires   Call MD for:  persistant  nausea and vomiting  As directed    Call MD for:  redness, tenderness, or signs of infection (pain, swelling, redness, odor or green/yellow discharge around incision site)  As directed    Call MD for:  severe uncontrolled pain  As directed    Call MD for:  temperature >100.4  As directed    Diet - low sodium heart healthy  As directed    Discharge instructions  As directed    Comments:     Increase activity as tolerated. May place ice pack for comfort.  Alternate an anti-inflammatory such as ibuprofen (Motrin, Advil) 400-654m every 6 hours with the prescribed pain medication.   Do not take any additional acetaminophen as there is Tylenol in the pain medication.   Discharge wound care:  As directed    Comments:     Keep clean with mild soap and water.  Call if redness increases or pus drains from incision.   Driving Restrictions  As directed    Comments:     No driving while on pain medications.   Increase activity slowly  As directed    Lifting restrictions  As directed    Comments:     No lifting over 20lbs for 4-5 weeks post-op.       Medication List         acetaminophen 500 MG tablet  Commonly known as:  TYLENOL  Take 1,000 mg by mouth every 6 (six) hours as needed. For pain     azaTHIOprine 50 MG tablet  Commonly known as:  IMURAN  Take 100 mg by mouth daily.     benztropine 0.5 MG tablet  Commonly known as:  COGENTIN  Take 0.5 mg by mouth daily.  CALCIUM 600 + D PO  Take 2 tablets by mouth 2 (two) times daily.     divalproex 500 MG 24 hr tablet  Commonly known as:  DEPAKOTE ER  Take 1,000 mg by mouth every evening.     divalproex 250 MG 24 hr tablet  Commonly known as:  DEPAKOTE ER  Take 250 mg by mouth at bedtime.     FLOMAX 0.4 MG Caps capsule  Generic drug:  tamsulosin  Take 0.4 mg by mouth daily.     folic acid 1 MG tablet  Commonly known as:  FOLVITE  Take 1 mg by mouth daily.     furosemide 40 MG tablet  Commonly known as:  LASIX  Take 40  mg by mouth daily.     gabapentin 300 MG capsule  Commonly known as:  NEURONTIN  Take 900 mg by mouth at bedtime.     HYDROcodone-acetaminophen 5-325 MG per tablet  Commonly known as:  NORCO/VICODIN  Take 1-2 tablets by mouth every 4 (four) hours as needed.     levofloxacin 500 MG tablet  Commonly known as:  LEVAQUIN  Take 1 tablet (500 mg total) by mouth daily.     metoprolol succinate 25 MG 24 hr tablet  Commonly known as:  TOPROL-XL  Take 25 mg by mouth daily.     metroNIDAZOLE 500 MG tablet  Commonly known as:  FLAGYL  Take 1 tablet (500 mg total) by mouth every 8 (eight) hours.     omeprazole 20 MG capsule  Commonly known as:  PRILOSEC  Take 20 mg by mouth daily.     potassium chloride SA 20 MEQ tablet  Commonly known as:  K-DUR,KLOR-CON  Take 20 mEq by mouth daily.     pravastatin 20 MG tablet  Commonly known as:  PRAVACHOL  Take 20 mg by mouth at bedtime.     PROAIR HFA 108 (90 BASE) MCG/ACT inhaler  Generic drug:  albuterol  Inhale 2 puffs into the lungs every 6 (six) hours as needed for wheezing or shortness of breath.     risperidone 4 MG tablet  Commonly known as:  RISPERDAL  Take 8 mg by mouth at bedtime.     rOPINIRole 2 MG tablet  Commonly known as:  REQUIP  Take 2 mg by mouth at bedtime.     sertraline 50 MG tablet  Commonly known as:  ZOLOFT  Take 50 mg by mouth daily.     traZODone 100 MG tablet  Commonly known as:  DESYREL  Take 100 mg by mouth at bedtime.         SignedDonato Heinz 06/11/2013, 9:34 AM

## 2013-06-11 NOTE — Progress Notes (Signed)
Subjective: +flatus, stools with more consistency, no abdominal pain, N/V, tolerating diet.   Objective: Vital signs in last 24 hours: Temp:  [98.2 F (36.8 C)-99 F (37.2 C)] 99 F (37.2 C) (09/08 0428) Pulse Rate:  [82-107] 90 (09/08 0428) Resp:  [20] 20 (09/08 0428) BP: (102-115)/(68-78) 115/71 mmHg (09/08 0428) SpO2:  [93 %-99 %] 99 % (09/08 0428) Last BM Date: 06/10/13 General:   Alert and oriented, pleasant Heart:  S1, S2 present, no murmurs noted.  Lungs: Clear to auscultation bilaterally Abdomen:  Bowel sounds present, distended but soft, non-tender, staples intact, ostomy site intact with air in bag Psych:  Alert and cooperative. Normal mood and affect.  Intake/Output from previous day: 09/07 0701 - 09/08 0700 In: 1371 [P.O.:960; I.V.:411] Out: 1850 [Urine:1450; Stool:400] Intake/Output this shift:    Lab Results:  Recent Labs  06/09/13 0634  WBC 7.5  HGB 15.4  HCT 42.1  PLT 218   BMET  Recent Labs  06/09/13 0634 06/10/13 0644 06/11/13 0524  NA 140 140 143  K 3.0* 3.1* 3.3*  CL 101 103 106  CO2 30 29 26   GLUCOSE 98 98 92  BUN 9 9 8   CREATININE 0.69 0.66 0.65  CALCIUM 8.7 8.9 8.9      Assessment: POD 6 s/p exploratory laparotomy with lysis of adhesions, with return of bowel function and tolerating diet. Appropriate for discharge home, anticipate today likely.   Plan: Continue Imuran 100 mg daily Anticipate discharge home today Signing off; will follow as outpatient in 4 weeks  Orvil Feil, ANP-BC [facility]    LOS: 11 days    06/11/2013, 8:50 AM

## 2013-06-25 ENCOUNTER — Telehealth: Payer: Self-pay

## 2013-06-25 NOTE — Telephone Encounter (Signed)
Pt called and said that he got his bubble pack from the pharmacy today and did not have his Imuran. I called and spoke to [facility] at [place] . He said Imuran is unavailable from manufacturer at this time. Some clients have been able to find a pharmacy that still had some. I called and informed pt and he said that he would not be able to get it from somewhere else because he has a charge acct at [place]. Please advise!

## 2013-06-26 ENCOUNTER — Encounter: Payer: Self-pay | Admitting: Gastroenterology

## 2013-06-26 MED ORDER — MERCAPTOPURINE 50 MG PO TABS: ORAL_TABLET | ORAL | Status: DC

## 2013-06-26 NOTE — Telephone Encounter (Signed)
ERROR

## 2013-06-26 NOTE — Telephone Encounter (Signed)
CALLED [facility]. 6-MP AND IMURAN ARE EQUIVALENT. [facility] HAVING TROUBLE GETTING IMURAN. CALLED [facility] TO SEE IF 6- MP IS AVAILABLE TO THEM VIA THEIR WHOLESALER. 6-MP IS AVAILABLE.  PLEASE CALL PT. MERCAPTOPURINE IS AVAILABLE & WORKS THE SAME AS IMURAN. I SENT A RX FOR MERCAPTOPRUINE TO [facility]. THE SIDE EFFECTS ARE THE SAME: NAUSEA, VOMITING, DIARRHEA, ABDOMINAL PAIN, IRRITATION OF THE LIVER OR PANCREAS. CALL WITH [facility].

## 2013-06-26 NOTE — Telephone Encounter (Signed)
Called and informed pt.  

## 2013-09-06 ENCOUNTER — Telehealth: Payer: Self-pay

## 2013-09-06 NOTE — Telephone Encounter (Signed)
REVIEWED.  

## 2013-09-06 NOTE — Telephone Encounter (Signed)
PLEASE CALL PT. A SMALL AMOUNT OF BLOOD IS OK BUT IF THE BLEEDING BECOMES HEAVY HE SHOULD GO TO THE ED.

## 2013-09-06 NOTE — Telephone Encounter (Signed)
Pt called and said he started on antibiotic yesterday for UTI  ( Nitrofurantoin) and today he has seen a little red blood in his bag x 2. He has not been having any other problems. He has an appt here next week. Please advise!

## 2013-09-06 NOTE — Telephone Encounter (Signed)
Called and informed pt.  

## 2013-09-12 ENCOUNTER — Ambulatory Visit (INDEPENDENT_AMBULATORY_CARE_PROVIDER_SITE_OTHER): Payer: Medicaid Other | Admitting: Gastroenterology

## 2013-09-12 ENCOUNTER — Encounter: Payer: Self-pay | Admitting: Gastroenterology

## 2013-09-12 VITALS — BP 120/71 | HR 91 | Temp 97.8°F | Ht 66.0 in | Wt 225.6 lb

## 2013-09-12 DIAGNOSIS — K5000 Crohn's disease of small intestine without complications: Secondary | ICD-10-CM

## 2013-09-12 NOTE — Assessment & Plan Note (Signed)
Sx CONTROLLED.   CONTINUE MERCAPTOPURINE.  OPV IN 4 MOS.  NEEDS HFP IN DEC 2014

## 2013-09-12 NOTE — Progress Notes (Signed)
Subjective:    Patient ID: [name], male    DOB: 23-Oct-1965, 47 y.o.   MRN: [id number] [name], MD  HPI LAST HOSPITALIZED AUG/SEP Nov 30, 2012 FOR BOWEL OBSTRUCTION, S/P LYSIS OF ADHESIONS. BMs: NL  TO SOFT. CHANGES BAG Q3 DAYS. EMPTIES 4-5 TIMES A DAY. NO BLOOD NO PAIN. PT DENIES FEVER, CHILLS, BRBPR, nausea, vomiting, melena, constipation, abd pain, problems swallowing, OR problems with sedation FOR SURGERY. MAY GET HEARTBURN AFTER NIGHTTIME PRILOSEC. SEEING UROLOGY MAY 2013-11-30 UNLESS GETS ANOTHER UTI.  [facility]. FATHER DIED JAN 2012-11-30. MOM DIED 1986. 1 BRO IN PRISON. 1 BRO-PTSD. FAMILY ALL OVER. NO COMPUTER ACCESS.  Past Medical History  Diagnosis Date  . Crohn's colitis     s/p total colectomy with ileostomy in 2007-12-01  . Nystagmus   . Bipolar disorder   . Asthma   . GERD (gastroesophageal reflux disease)   . Hyperlipidemia   . Insomnia   . Enterococcus UTI 01-Dec-2007  . 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 DVT (deep venous thrombosis) 2007/12/01    right upper extremity due to PICC  . S/P endoscopy Sept 2012    mild gastritis, small hiatal hernia, no ulcers  . Crohn's disease of small intestine Sept 2012    ileoscopy: multiple ulcers likely secondary to Crohn's  . Schizophrenia, acute   . Migraine headache   . Hernia   . Colostomy in place    Past Surgical History  Procedure Laterality Date  . Total colectomy  12/01/2007    with ileostomy at [facility] for refractory disease   . Total shoulder replacement      bilateral  . Total hip arthroplasty      right, due to avascular necrosis from chronic prednisone  . Kidney surgery    . Appendectomy    . Eye surgery    . Esophagogastroduodenoscopy  07/2010    gastritis,  bx neg for H.Pylori  . Small bowel capsule study  08/2010    few tiny nonbleeding erosions/ulcers ?secondary to relafen or Crohn's. Entire small bowel not seen.   . Ileoscopy  06/29/2011    SLF:  ulcers,multiple/small HH/mild gastritis  . Laparotomy N/A 06/05/2013    Procedure: EXPLORATORY LAPAROTOMY;  Surgeon: Donato Heinz, MD;  Location: [facility];  Service: General;  Laterality: N/A;  . Lysis of adhesion N/A 06/05/2013    Procedure: LYSIS OF ADHESIONS;  Surgeon: Donato Heinz, MD;  Location: [facility];  Service: General;  Laterality: N/A;   Allergies  Allergen Reactions  . Aspirin     REACTION: unknown reaction  . Codeine     REACTION: unknown reaction  . Ibuprofen     REACTION: unknown reaction  . Penicillins     REACTION: unknown reaction  . Remicade [Infliximab]     COULDN'T BREATHE  . Tramadol Hcl     REACTION: unknown reaction  . Vicodin [Hydrocodone-Acetaminophen] Nausea Only    Current Outpatient Prescriptions  Medication Sig Dispense Refill  . acetaminophen (TYLENOL) 500 MG tablet Take 1,000 mg by mouth every 6 (six) hours as needed. For pain       . albuterol (PROAIR HFA) 108 (90 BASE) MCG/ACT inhaler Inhale 2 puffs into the lungs every 6 (six) hours as needed for wheezing or shortness of breath.      . benztropine (COGENTIN) 0.5 MG tablet Take 0.5 mg by mouth daily.       Marland Kitchen  Calcium Carbonate-Vitamin D (CALCIUM 600 + D PO) Take 2 tablets by mouth 2 (two) times daily.       . divalproex (DEPAKOTE ER) 250 MG 24 hr tablet Take 250 mg by mouth at bedtime.       . divalproex (DEPAKOTE) 500 MG 24 hr tablet Take 1,000 mg by mouth every evening.       . folic acid (FOLVITE) 1 MG tablet Take 1 mg by mouth daily.        . furosemide (LASIX) 40 MG tablet Take 40 mg by mouth daily.        Marland Kitchen gabapentin (NEURONTIN) 300 MG capsule Take 900 mg by mouth at bedtime.       . mercaptopurine (PURINETHOL) 50 MG tablet 2 PO DAILY.TAKE on an empty stomach 1 hour before or 2 hours after meals.  60 tablet  11  . metoprolol succinate (TOPROL-XL) 25 MG 24 hr tablet Take 25 mg by mouth daily.       . nitrofurantoin, macrocrystal-monohydrate, (MACROBID) 100 MG capsule Take 100 mg by mouth 2  (two) times daily.  FOR UTI    . omeprazole (PRILOSEC) 20 MG capsule Take 20 mg by mouth daily.      . potassium chloride SA (K-DUR,KLOR-CON) 20 MEQ tablet Take 20 mEq by mouth daily.       . risperidone (RISPERDAL) 4 MG tablet Take 8 mg by mouth at bedtime.      . sertraline (ZOLOFT) 50 MG tablet Take 50 mg by mouth daily.      . Tamsulosin HCl (FLOMAX) 0.4 MG CAPS Take 0.4 mg by mouth daily.        . traZODone (DESYREL) 100 MG tablet Take 100 mg by mouth at bedtime.      .      .      .      . pravastatin (PRAVACHOL) 20 MG tablet Take 20 mg by mouth at bedtime.       Marland Kitchen rOPINIRole (REQUIP) 2 MG tablet Take 2 mg by mouth at bedtime.          Review of Systems     Objective:   Physical Exam  Vitals reviewed. Constitutional: He is oriented to person, place, and time. He appears well-nourished. No distress.  HENT:  Head: Normocephalic and atraumatic.  Mouth/Throat: Oropharynx is clear and moist. No oropharyngeal exudate.  Eyes: Pupils are equal, round, and reactive to light. No scleral icterus.  Neck: Normal range of motion.  Cardiovascular: Normal rate, regular rhythm and normal heart sounds.   Pulmonary/Chest: Breath sounds normal. No respiratory distress.  Abdominal: Soft. Bowel sounds are normal. He exhibits no distension. There is no tenderness.  GREEN/DARK STOOL IN BAG  Lymphadenopathy:    He has no cervical adenopathy.  Neurological: He is alert and oriented to person, place, and time.  NO  NEW FOCAL DEFICITS   Psychiatric: He has a normal mood and affect.          Assessment & Plan:

## 2013-09-12 NOTE — Progress Notes (Signed)
cc'd to pcp 

## 2013-09-12 NOTE — Patient Instructions (Addendum)
CONTINUE MERCAPTOPURINE.  FOLLOW UP IN 4 MOS.   YOU NEED YOUR LIVER ENZYME LEVELS CHECKED IN DEC 2014.

## 2013-09-19 ENCOUNTER — Telehealth: Payer: Self-pay

## 2013-09-19 NOTE — Progress Notes (Signed)
Reminder in epic °

## 2013-09-19 NOTE — Telephone Encounter (Signed)
Pt called and said he is still having some indigestion really bad. He takes prilosec bid, and he said Dr. Oneida Alar told him to call if he had problems again with indigestion and he had a bad episode today after his morning dose of prilosec. Please advise!

## 2013-09-25 MED ORDER — PANTOPRAZOLE SODIUM 40 MG PO TBEC: 40.0000 mg | DELAYED_RELEASE_TABLET | Freq: Every day | ORAL | Status: DC

## 2013-09-25 MED ORDER — DEXLANSOPRAZOLE 60 MG PO CPDR: DELAYED_RELEASE_CAPSULE | ORAL | Status: DC

## 2013-09-25 NOTE — Addendum Note (Signed)
Addended by: Orvil Feil on: 09/25/2013 04:13 PM   Modules accepted: Orders

## 2013-09-25 NOTE — Telephone Encounter (Signed)
Called patient TO DISCUSS CONCERNS. DRINKS DECAF BLACK COFFEE. WEIGHT UP TO 230 LBS. GIRLFRIEND BRINGS HIM BREAKFAST EVERY AM. EATING PANCAKES, EGGS(2), AND SAUSAGE PATTIES(2). CHANGE TO DEXILANT. PT MAY NEED OVERRIDE. STRICTLY ADHERE TO A LOW FAT DIET. SEND PT A HO. LOSE WEIGHT.

## 2013-09-25 NOTE — Telephone Encounter (Signed)
Sent Protonix to pharmacy.

## 2013-09-25 NOTE — Telephone Encounter (Addendum)
Received request from the pharmacy for a PA for dexilant. Pt has [place] medicaid, he has to try and fail 2 preferred medications. After looking thru his chart and I called and asked the pt, he has only tried prilosec. He will need to try either protonix or prevacid before they will approve dexilant. He is aware that he will have to try another medication.   He said it was ok to send rx to pharmacy.

## 2013-09-25 NOTE — Telephone Encounter (Signed)
Mailed low fat diet.

## 2013-10-23 ENCOUNTER — Telehealth: Payer: Self-pay

## 2013-10-23 DIAGNOSIS — R1032 Left lower quadrant pain: Secondary | ICD-10-CM

## 2013-10-23 NOTE — Telephone Encounter (Signed)
Pt left Vm yesterday and I was not here. He said he was having really bad abdominal pain. I called him when I got the voice mail and asked him why he did not call back and talk to someone yesterday, that I was off and no one got the message. He said he started feeling some better. He complains with lower left quad pain x 2 days, constant, but at times more intense. BM's are OK. No blood in stool. Has had some nausea and vomited a couple of times a day for two days. He does feel better. Please advise!

## 2013-10-23 NOTE — Telephone Encounter (Signed)
REVIEWED.  

## 2013-10-23 NOTE — Telephone Encounter (Signed)
CT Scan is scheduled for Friday Jan 23 at 9:30, [name] is aware to go by ahead of time and pick up oral contrast

## 2013-10-23 NOTE — Telephone Encounter (Signed)
Pt is aware that I am faxing the orders to [facility] for today. I called and informed his nurse, Beth Buono. Pt said to schedule the CT for Friday so he will have transportation. He is aware to go to the ED if his abdominal pain worsens.

## 2013-10-23 NOTE — Telephone Encounter (Signed)
T/C from [place]. She went to the patient's house and collected some stool, but he didn't have a lot. She was unable to draw the blood. She said he is really hard to stick, and she usually has to have him drink a lot of fluids a couple of days in advance prior to drawing. She told him to drink a lot and she will go back and try tomorrow, and if there is a problem, she will call us. She said he seems to be feeling very well at this time.

## 2013-10-23 NOTE — Telephone Encounter (Signed)
Called patient TO DISCUSS COMPLAINT. LOWER LEFT SIDE BEEN HURTING FOR 3 DAYS AND THROWING UP. NO DYSURIA OR HEMATURIA. STOOL. DIARRHEA ALL THE TIME. CAN'T TELL IF BLOOD IN STOOL. ON BACTRIM FOR UTI FOR 10 DAYS FROM UROLOGIST. NO TRANSPORTATION TODAY. NEEDS [facility] TO COLLECT STOOL FOR C DIFF & CMP, LIPASE TODAY. NEEDS CT ABD/PLEVIS W/ IVC FRI BECAUSE PT DOESN'T WANT TO GO TO ED TODAY AND PREFERS TO [facility] FOR TRANSPORTATION FRI. EXPLAINED PT SHOULD GO TO ED FOR INCREASED ABD PAIN.

## 2013-10-25 ENCOUNTER — Telehealth: Payer: Self-pay | Admitting: *Deleted

## 2013-10-25 NOTE — Telephone Encounter (Signed)
Let me know if you get results. Otherwise, address with [facility] tomorrow.

## 2013-10-25 NOTE — Telephone Encounter (Signed)
Pt called stating his phone will be out of minutes, pt wanted to give [place] his neighbor's numbers her name is Lenna Sciara, they are both cell phone numbers, pt just had blood work done today, please call him at [phone] or [phone]. Pt states he is still having stomach pain, pt states he is not having trouble using the bathroom but he does have diarrhea.

## 2013-10-25 NOTE — Telephone Encounter (Signed)
I tried to call both numbers, The first one someone hung up when I asked to speak to [place]. I left VM on the second number for a return call from [place].   [facility] and left vm for Beth , nurse, to please let me know when we could expect results and to please fax them to [place] and ATTN August Gosser.

## 2013-10-26 ENCOUNTER — Ambulatory Visit (HOSPITAL_COMMUNITY)
Admission: RE | Admit: 2013-10-26 | Discharge: 2013-10-26 | Disposition: A | Discharge status: Home / Self Care | Payer: Medicaid Other | Source: Ambulatory Visit | Attending: Gastroenterology | Admitting: Gastroenterology

## 2013-10-26 DIAGNOSIS — Z932 Ileostomy status: Secondary | ICD-10-CM | POA: Insufficient documentation

## 2013-10-26 DIAGNOSIS — IMO0002 Reserved for concepts with insufficient information to code with codable children: Secondary | ICD-10-CM | POA: Insufficient documentation

## 2013-10-26 DIAGNOSIS — R109 Unspecified abdominal pain: Secondary | ICD-10-CM | POA: Insufficient documentation

## 2013-10-26 DIAGNOSIS — R1032 Left lower quadrant pain: Secondary | ICD-10-CM

## 2013-10-26 DIAGNOSIS — Z9049 Acquired absence of other specified parts of digestive tract: Secondary | ICD-10-CM | POA: Insufficient documentation

## 2013-10-26 MED ORDER — IOHEXOL 300 MG/ML  SOLN
100.0000 mL | Freq: Once | INTRAMUSCULAR | Status: AC | PRN
  Administered 2013-10-26: 100 mL via INTRAVENOUS

## 2013-10-26 NOTE — Telephone Encounter (Signed)
PLEASE CALL PT. His loose stools are most likely due to abx use. His C diff test is negative. HIS BLOOD TEST IS NL.

## 2013-10-26 NOTE — Telephone Encounter (Signed)
Pt's blood work results were received today. Lipase is 40. CMP all normal except Bun/creatinine ratio low at 8 ( normal is 9-20). I called the home health nurse and left Vm that I have not received the C-Diff results and to please try to find out and call that to me before noon today if at all possible.

## 2013-10-26 NOTE — Telephone Encounter (Signed)
VM from [place], nurse at [facility] and said the C-Diff was negative.

## 2013-10-26 NOTE — Telephone Encounter (Signed)
LMOM for a return call. Could not leave a message, it is someone else's number.

## 2013-10-29 NOTE — Telephone Encounter (Signed)
Tried to call pt. Letter mailed for him to call.

## 2013-10-30 NOTE — Telephone Encounter (Signed)
Pt called and was informed of the results.

## 2013-11-01 ENCOUNTER — Telehealth: Payer: Self-pay

## 2013-11-01 NOTE — Telephone Encounter (Signed)
Clear liquids only today. Monitor for change in stools or fever. If develops refractory vomiting or If abdominal worsens, he should go to ER. H/O large peristomal hernia with long segment of small bowel within, looked ok on CT 10/26/13.  Touch base with [place] tomorrow to see how he is doing.

## 2013-11-01 NOTE — Telephone Encounter (Signed)
Called and informed pt.  

## 2013-11-01 NOTE — Telephone Encounter (Signed)
Pt called and has had abdominal cramps today after eating breakfast and after drinking liquids. He said it last for about 10 min and then it went away. He has been eating his normal goods. No change in BM's. No other symptoms. Please advise!

## 2013-11-07 NOTE — Telephone Encounter (Addendum)
Called patient TO DISCUSS RESULTS OF CT. CHANGED BACTRIM TO MACROBID AND COMPLETED ABX LAST WEEK. FEELING BETTER. BMS:2 TODAY(NO BLOOD).

## 2013-11-21 ENCOUNTER — Ambulatory Visit: Payer: Medicaid Other | Admitting: Gastroenterology

## 2013-12-04 ENCOUNTER — Other Ambulatory Visit: Payer: Self-pay | Admitting: Gastroenterology

## 2013-12-13 ENCOUNTER — Telehealth: Payer: Self-pay

## 2013-12-13 ENCOUNTER — Ambulatory Visit: Payer: Medicaid Other | Admitting: Gastroenterology

## 2013-12-13 NOTE — Telephone Encounter (Signed)
Pt is having sever diarrhea with abd pain. He is going though multiple bags a day. He has tried everything over the counter and it is not working. Please advise

## 2013-12-13 NOTE — Telephone Encounter (Signed)
BEEN HAVING PAIN/DIARRHEA FOR ABOUT A WEEK. NO BLOOD IN BAG. JUST STARTED TAKING CIPRO TODAY FOR UTI FOR 7 DAYS. DRINKING WATER TO KEEP URINE LIGHT YELLOW. TAKE PROBIOTIC DAILY. SEEN AT MED CTR. WILL GET APPT AT UROLOGY. USE KAOPECTATE PRN TO SLOW IT DOWN. NOW WEIGHS 236 LBS. CUT PORTION SIZE. CALL NEXT THUR IF SX NOT BETTER. SX DUE UTI/ABX USE.

## 2013-12-26 ENCOUNTER — Encounter (HOSPITAL_COMMUNITY): Payer: Self-pay | Admitting: Pharmacy Technician

## 2013-12-26 ENCOUNTER — Ambulatory Visit (INDEPENDENT_AMBULATORY_CARE_PROVIDER_SITE_OTHER): Payer: Medicaid Other | Admitting: Gastroenterology

## 2013-12-26 ENCOUNTER — Encounter (INDEPENDENT_AMBULATORY_CARE_PROVIDER_SITE_OTHER): Payer: Self-pay

## 2013-12-26 ENCOUNTER — Other Ambulatory Visit: Payer: Self-pay | Admitting: Gastroenterology

## 2013-12-26 ENCOUNTER — Ambulatory Visit: Payer: Medicaid Other | Admitting: Gastroenterology

## 2013-12-26 ENCOUNTER — Encounter: Payer: Self-pay | Admitting: Gastroenterology

## 2013-12-26 VITALS — BP 104/65 | HR 69 | Temp 98.6°F | Ht 66.0 in | Wt 233.2 lb

## 2013-12-26 DIAGNOSIS — R1310 Dysphagia, unspecified: Secondary | ICD-10-CM

## 2013-12-26 DIAGNOSIS — K5000 Crohn's disease of small intestine without complications: Secondary | ICD-10-CM

## 2013-12-26 MED ORDER — LIDOCAINE VISCOUS 2 % MT SOLN: OROMUCOSAL | Status: DC

## 2013-12-26 NOTE — Assessment & Plan Note (Signed)
WITH PILLS AND FOOD. DIFFERENTIAL DIAGNOSIS INCLUDES CROHN'S ULCER IN ESOPHAGUS, PILL INDUCED ESOPHAGITIS, AND LESS LIKELY HSV ESOPHAGITIS OR ESOPHAGEAL CA.  ADD VISCOUS LIDOCAINE PRIOR TO PILLS AND MEALS. EGD APR 7 OPV AUG 2015.

## 2013-12-26 NOTE — Patient Instructions (Addendum)
ADD VISCOUS LIDOCAINE PRIOR TO PILLS AND MEALS.  CONTINUE MERCAPTOPURINE.  TAKE POTASSIUM WITH LUNCH.  ENDOSCOPY April 7.  FOLLOW UP IN AUG 2015.

## 2013-12-26 NOTE — Progress Notes (Signed)
cc'd to pcp 

## 2013-12-26 NOTE — Progress Notes (Signed)
Subjective:    Patient ID: [name], male    DOB: [birth year], 48 y.o.   MRN: [id number] [name], MD  HPI GETS DIARRHEA WHEN HE TAKES HIS AM MEDS. TAKING KCL WITH AM MEDS. IF HE DOESN'T TAKE AM MEDS, HE HAS NL FORMED STOOL. BLOOD IN BAGx1. PT DENIES FEVER, CHILLS, nausea, vomiting, melena, constipation, abd pain, OR problems swallowing. Heartburn: 3X/DAY.  YESTERDAY BAKED CHICKEN CREAM POTATOES. NO SNACKS. DRINKING WATER AND CRANBERRY JUICE. OFF SODAS. HAS BURNING IN HIS CHEST. FEELS LIKE IT'S ON FIRE. A COUPLE OF HOURS. PAIN WHEN HE SWALLOWS WITH PILLS AND FOOD. NOW TAKE PILLS ONE AT A TIME DUE TO BURNING IN HIS CHEST. NO PROBLEMS WITH SEDATION.  Past Medical History  Diagnosis Date  . Crohn's colitis     s/p total colectomy with ileostomy in 2009  . Nystagmus   . Bipolar disorder   . Asthma   . GERD (gastroesophageal reflux disease)   . Hyperlipidemia   . Insomnia   . Enterococcus UTI 2009  . 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 DVT (deep venous thrombosis) 2009    right upper extremity due to PICC  . S/P endoscopy Sept 2012    mild gastritis, small hiatal hernia, no ulcers  . Crohn's disease of small intestine Sept 2012    ileoscopy: multiple ulcers likely secondary to Crohn's  . Schizophrenia, acute   . Migraine headache   . Hernia   . Colostomy in place     Past Surgical History  Procedure Laterality Date  . Total colectomy  2009    with ileostomy at [facility] for refractory disease   . Total shoulder replacement      bilateral  . Total hip arthroplasty      right, due to avascular necrosis from chronic prednisone  . Kidney surgery    . Appendectomy    . Eye surgery    . Esophagogastroduodenoscopy  07/2010    gastritis,  bx neg for H.Pylori  . Small bowel capsule study  08/2010    few tiny nonbleeding erosions/ulcers ?secondary to relafen or Crohn's. Entire small bowel not seen.   . Ileoscopy  06/29/2011      SLF: ulcers,multiple/small HH/mild gastritis  . Laparotomy N/A 06/05/2013    Procedure: EXPLORATORY LAPAROTOMY;  Surgeon: Donato Heinz, MD;  Location: [facility];  Service: General;  Laterality: N/A;  . Lysis of adhesion N/A 06/05/2013    Procedure: LYSIS OF ADHESIONS;  Surgeon: Donato Heinz, MD;  Location: [facility];  Service: General;  Laterality: N/A;   Allergies  Allergen Reactions  . Aspirin     REACTION: unknown reaction  . Bactrim [Sulfamethoxazole-Tmp Ds]     ABD CRAMPS AND DIARRHEA  . Codeine     REACTION: unknown reaction  . Ibuprofen     REACTION: unknown reaction  . Penicillins     REACTION: unknown reaction  . Remicade [Infliximab]     COULDN'T BREATHE  . Tramadol Hcl     REACTION: unknown reaction  . Vicodin [Hydrocodone-Acetaminophen] Nausea Only    Current Outpatient Prescriptions  Medication Sig Dispense Refill  . benztropine (COGENTIN) 0.5 MG tablet Take 0.5 mg by mouth daily.       . Calcium Carbonate-Vitamin D (CALCIUM 600 + D PO) Take 2 tablets by mouth 2 (two) times daily.       . divalproex (DEPAKOTE ER) 250 MG 24 hr  tablet Take 250 mg by mouth at bedtime.       . divalproex (DEPAKOTE) 500 MG 24 hr tablet Take 1,000 mg by mouth every evening.       . folic acid (FOLVITE) 1 MG tablet Take 1 mg by mouth daily.        . furosemide (LASIX) 40 MG tablet Take 40 mg by mouth daily.        Marland Kitchen gabapentin (NEURONTIN) 300 MG capsule Take 900 mg by mouth at bedtime.       . mercaptopurine (PURINETHOL) 50 MG tablet 2 PO DAILY.TAKE on an empty stomach 1 hour before or 2 hours after meals.    . metoprolol succinate (TOPROL-XL) 25 MG 24 hr tablet Take 25 mg by mouth daily.       . pantoprazole (PROTONIX) 40 MG tablet TAKE (1) TABLET BY MOUTH DAILY BEFORE BREAKFAST.    Marland Kitchen potassium chloride SA (K-DUR,KLOR-CON) 20 MEQ tablet Take 20 mEq by mouth daily.     . pravastatin (PRAVACHOL) 20 MG tablet Take 20 mg by mouth at bedtime.     Marland Kitchen rOPINIRole (REQUIP) 2 MG tablet Take 2 mg by  mouth at bedtime.    . sertraline (ZOLOFT) 50 MG tablet Take 50 mg by mouth daily.    . Tamsulosin HCl (FLOMAX) 0.4 MG CAPS Take 0.4 mg by mouth daily.      . traZODone (DESYREL) 100 MG tablet Take 100 mg by mouth at bedtime.    Marland Kitchen acetaminophen (TYLENOL) 500 MG tablet Take 1,000 mg by mouth every 6 (six) hours as needed. For pain     . albuterol (PROAIR HFA) 108 (90 BASE) MCG/ACT inhaler Inhale 2 puffs into the lungs every 6 (six) hours as needed for wheezing or shortness of breath.    .      .      . HYDROcodone-acetaminophen (NORCO/VICODIN) 5-325 MG per tablet Take 1-2 tablets by mouth every 4 (four) hours as needed.    .      .      .        . risperidone (RISPERDAL) 4 MG tablet Take 8 mg by mouth at bedtime.          Review of Systems     Objective:   Physical Exam  Vitals reviewed. Constitutional: He is oriented to person, place, and time. He appears well-nourished. No distress.  HENT:  Head: Normocephalic and atraumatic.  Mouth/Throat: Oropharynx is clear and moist. No oropharyngeal exudate.  Eyes: Pupils are equal, round, and reactive to light. No scleral icterus.  Neck: Normal range of motion. Neck supple.  Cardiovascular: Normal rate, regular rhythm and normal heart sounds.   Pulmonary/Chest: Effort normal and breath sounds normal. No respiratory distress.  Abdominal: Soft. Bowel sounds are normal. He exhibits no distension. There is no tenderness.  OSTOMY IN RLQ-BROWN STOOL IN BAG, NO BLOOD.  Musculoskeletal: He exhibits no edema.  Lymphadenopathy:    He has no cervical adenopathy.  Neurological: He is alert and oriented to person, place, and time.  NO  NEW FOCAL DEFICITS   Psychiatric: He has a normal mood and affect.          Assessment & Plan:

## 2013-12-26 NOTE — Assessment & Plan Note (Signed)
SX CONTROLLED. WATERY STOOL MOST LIKELY DUE TO MEDS.  CONTINUE MERCAPTOPURINE. TAKE KCL WITH LUNCH. OPV IN AUG 2015.

## 2013-12-27 ENCOUNTER — Telehealth: Payer: Self-pay

## 2013-12-27 NOTE — Telephone Encounter (Signed)
PLEASE CALL PT. VISCOUS LIDOCAINE SHOULD NOT MAKE HIM SLEEPY. HE CAN TRY HALF THE DOSE TO SEE IF HE'S LESS SLEEPY.

## 2013-12-27 NOTE — Telephone Encounter (Signed)
Pt called to see if the Lidocaine he was given yesterday should make him sleepy. He took one dose last night and one dose today and he has been very sleepy. Please advise!

## 2013-12-27 NOTE — Telephone Encounter (Signed)
Called and informed pt.  

## 2013-12-27 NOTE — Progress Notes (Signed)
Reminder in epic °

## 2013-12-28 ENCOUNTER — Telehealth: Payer: Self-pay | Admitting: Gastroenterology

## 2013-12-28 NOTE — Telephone Encounter (Signed)
PLEASE CALL PT. If he thinks the meds are making him sleepy, he should stop the medication.

## 2013-12-28 NOTE — Telephone Encounter (Signed)
Called and informed pt.  

## 2013-12-28 NOTE — Telephone Encounter (Signed)
I called and spoke to pt. He cut back to one tsp of Lidocaine per Dr. Nona Dell recommendation. He said it is still making him sleepy. He said he went to bed last night and slept til 8:00 AM this morning. Please advise!

## 2013-12-28 NOTE — Telephone Encounter (Signed)
The oral medication that he is taking with the lidocaine in it is making him sleepy and he is stating he is only taking one at this time, please advise?

## 2013-12-31 ENCOUNTER — Telehealth: Payer: Self-pay | Admitting: Gastroenterology

## 2013-12-31 NOTE — Telephone Encounter (Signed)
I called pt and he said that [facility] but can no longer do so.   He needs a prescription faxed to the [facility]. ( phone number listed, will have to call for the fax number)  He said that he has about 3.5 boxes left. There are 10 in a box and he changes one about every 3-4 days.

## 2013-12-31 NOTE — Telephone Encounter (Signed)
Pt called to give SF a number to [facility] so he can get his colostomy bags through them. He said when you call to call him back to give him the fax number so he can send his info to them. [phone]

## 2014-01-02 NOTE — Telephone Encounter (Signed)
Prescriptions and paperwork has been faxed to [facility] at [phone]. ( Their phone number is [phone]). Pt is aware.

## 2014-01-02 NOTE — Telephone Encounter (Addendum)
SPOKE TO [facility]. RX WRITTEN. WILL FAX RX/LAST OFC NOT/SHIPPING ADDRESS/ICD-9 DIAGNOSIS TODAY. LARGE ADULT BRIEFS, #72 RF X1 YR CHUCKS #100, RFX 1 YR OSTOMY POUCHES # 64 RFX1 YR  CALL PT AND LET HIM KNOW THE PAPERWORK IS COMPLETE.

## 2014-01-08 ENCOUNTER — Encounter (HOSPITAL_COMMUNITY)
Admission: RE | Disposition: A | Discharge status: Home / Self Care | Payer: Self-pay | Source: Ambulatory Visit | Attending: Gastroenterology

## 2014-01-08 ENCOUNTER — Ambulatory Visit (HOSPITAL_COMMUNITY)
Admission: RE | Admit: 2014-01-08 | Discharge: 2014-01-08 | Disposition: A | Discharge status: Home / Self Care | Payer: Medicaid Other | Source: Ambulatory Visit | Attending: Gastroenterology | Admitting: Gastroenterology

## 2014-01-08 ENCOUNTER — Encounter (HOSPITAL_COMMUNITY): Payer: Self-pay | Admitting: *Deleted

## 2014-01-08 DIAGNOSIS — R1310 Dysphagia, unspecified: Secondary | ICD-10-CM | POA: Insufficient documentation

## 2014-01-08 DIAGNOSIS — E785 Hyperlipidemia, unspecified: Secondary | ICD-10-CM | POA: Insufficient documentation

## 2014-01-08 DIAGNOSIS — F319 Bipolar disorder, unspecified: Secondary | ICD-10-CM | POA: Insufficient documentation

## 2014-01-08 DIAGNOSIS — Z79899 Other long term (current) drug therapy: Secondary | ICD-10-CM | POA: Insufficient documentation

## 2014-01-08 DIAGNOSIS — K259 Gastric ulcer, unspecified as acute or chronic, without hemorrhage or perforation: Secondary | ICD-10-CM | POA: Insufficient documentation

## 2014-01-08 DIAGNOSIS — Z9049 Acquired absence of other specified parts of digestive tract: Secondary | ICD-10-CM | POA: Insufficient documentation

## 2014-01-08 DIAGNOSIS — Z933 Colostomy status: Secondary | ICD-10-CM | POA: Insufficient documentation

## 2014-01-08 HISTORY — DX: Schizophrenia, unspecified: F20.9

## 2014-01-08 HISTORY — DX: Major depressive disorder, single episode, unspecified: F32.9

## 2014-01-08 HISTORY — PX: ESOPHAGOGASTRODUODENOSCOPY: SHX5428

## 2014-01-08 HISTORY — DX: Depression, unspecified: F32.A

## 2014-01-08 HISTORY — DX: Unspecified osteoarthritis, unspecified site: M19.90

## 2014-01-08 LAB — COMPREHENSIVE METABOLIC PANEL
ALK PHOS: 67 U/L (ref 39–117)
ALT: 34 U/L (ref 0–53)
AST: 38 U/L — ABNORMAL HIGH (ref 0–37)
Albumin: 3.6 g/dL (ref 3.5–5.2)
BUN: 9 mg/dL (ref 6–23)
CO2: 25 mEq/L (ref 19–32)
Calcium: 9.4 mg/dL (ref 8.4–10.5)
Chloride: 104 mEq/L (ref 96–112)
Creatinine, Ser: 0.78 mg/dL (ref 0.50–1.35)
GFR calc non Af Amer: >90 mL/min (ref >90)
Glucose, Bld: 85 mg/dL (ref 70–99)
Potassium: 4.6 mEq/L (ref 3.7–5.3)
SODIUM: 144 meq/L (ref 137–147)
Total Bilirubin: 0.7 mg/dL (ref 0.3–1.2)
Total Protein: 7.4 g/dL (ref 6.0–8.3)

## 2014-01-08 LAB — CBC WITH DIFFERENTIAL/PLATELET
Basophils Absolute: 0 10*3/uL (ref 0.0–0.1)
Basophils Relative: 0 % (ref 0–1)
EOS PCT: 1 % (ref 0–5)
Eosinophils Absolute: 0.1 10*3/uL (ref 0.0–0.7)
HCT: 46.9 % (ref 39.0–52.0)
HEMOGLOBIN: 16.2 g/dL (ref 13.0–17.0)
Lymphocytes Relative: 25 % (ref 12–46)
Lymphs Abs: 1.3 10*3/uL (ref 0.7–4.0)
MCH: 34.4 pg — ABNORMAL HIGH (ref 26.0–34.0)
MCHC: 34.5 g/dL (ref 30.0–36.0)
MCV: 99.6 fL (ref 78.0–100.0)
Monocytes Absolute: 0.5 10*3/uL (ref 0.1–1.0)
Monocytes Relative: 10 % (ref 3–12)
NEUTROS PCT: 64 % (ref 43–77)
Neutro Abs: 3.4 10*3/uL (ref 1.7–7.7)
PLATELETS: 162 10*3/uL (ref 150–400)
RBC: 4.71 MIL/uL (ref 4.22–5.81)
RDW: 14.5 % (ref 11.5–15.5)
WBC: 5.3 10*3/uL (ref 4.0–10.5)

## 2014-01-08 SURGERY — EGD (ESOPHAGOGASTRODUODENOSCOPY)
Anesthesia: Moderate Sedation

## 2014-01-08 MED ORDER — MEPERIDINE HCL 100 MG/ML IJ SOLN
INTRAMUSCULAR | Status: DC
Start: 2014-01-08 — End: 2014-01-08
  Filled 2014-01-08: qty 2

## 2014-01-08 MED ORDER — MIDAZOLAM HCL 5 MG/5ML IJ SOLN
INTRAMUSCULAR | Status: DC | PRN
  Administered 2014-01-08 (×4): 2 mg via INTRAVENOUS

## 2014-01-08 MED ORDER — STERILE WATER FOR IRRIGATION IR SOLN
Status: DC | PRN
  Administered 2014-01-08: 13:00:00

## 2014-01-08 MED ORDER — SODIUM CHLORIDE 0.9 % IJ SOLN
INTRAMUSCULAR | Status: AC
  Filled 2014-01-08: qty 10

## 2014-01-08 MED ORDER — LIDOCAINE VISCOUS 2 % MT SOLN
OROMUCOSAL | Status: DC | PRN
  Administered 2014-01-08: 3 mL via OROMUCOSAL

## 2014-01-08 MED ORDER — PROMETHAZINE HCL 25 MG/ML IJ SOLN
INTRAMUSCULAR | Status: DC | PRN
  Administered 2014-01-08: 12.5 mg via INTRAVENOUS

## 2014-01-08 MED ORDER — MIDAZOLAM HCL 5 MG/5ML IJ SOLN
INTRAMUSCULAR | Status: AC
  Filled 2014-01-08: qty 10

## 2014-01-08 MED ORDER — LIDOCAINE VISCOUS 2 % MT SOLN
OROMUCOSAL | Status: AC
  Filled 2014-01-08: qty 15

## 2014-01-08 MED ORDER — PROMETHAZINE HCL 25 MG/ML IJ SOLN
INTRAMUSCULAR | Status: AC
  Filled 2014-01-08: qty 1

## 2014-01-08 MED ORDER — SODIUM CHLORIDE 0.9 % IV SOLN
INTRAVENOUS | Status: DC
  Administered 2014-01-08: 12:00:00 via INTRAVENOUS

## 2014-01-08 MED ORDER — MEPERIDINE HCL 100 MG/ML IJ SOLN
INTRAMUSCULAR | Status: DC | PRN
  Administered 2014-01-08: 50 mg via INTRAVENOUS
  Administered 2014-01-08 (×2): 25 mg via INTRAVENOUS

## 2014-01-08 NOTE — Telephone Encounter (Signed)
T/C from [place] at [facility]. He said they could not read the prescriptions that were faxed over for the pt's colostomy supplies. He requests it be sent by e-mail.  All info below:  [address], [place]  [address]  Phone: [phone] Fax:      [phone]  Email: [email or web address]  Routing to [place] who will be sending these prescriptions.

## 2014-01-08 NOTE — Telephone Encounter (Signed)
E-mail sent with Rx attached.  I also copied [name] on the e-mail.

## 2014-01-08 NOTE — Interval H&P Note (Signed)
History and Physical Interval Note:  01/08/2014 12:15 PM  [name]  has presented today for surgery, with the diagnosis of Odynophagia  The various methods of treatment have been discussed with the patient and family. After consideration of risks, benefits and other options for treatment, the patient has consented to  Procedure(s) with comments: ESOPHAGOGASTRODUODENOSCOPY (EGD) (N/A) - 1:00 Patient Request Time due to Transportation as a surgical intervention .  The patient's history has been reviewed, patient examined, no change in status, stable for surgery.  I have reviewed the patient's chart and labs.  Questions were answered to the patient's satisfaction.     [employer]

## 2014-01-08 NOTE — Discharge Instructions (Signed)
No source for your pain with swallowing was identified. YOU HAVE small ulcers in your stomach. I BIOPSIED YOUR STOMACH AND ESOPHAGUS.   AVOID ASPIRIN, BC/GOODY POWDERS, IBUPROFEN/MOTRIN, OR NAPROXEN/ALEVE BECAUSE YOU HAVE A ULCERS IN YOUR STOMACH AND CROHN'S DISEASE.  USE TYLENOL AS NEEDED FOR PAIN.  CONTINUE PROTONIX.   FOLLOW A LOW FAT DIET. SEE INFO BELOW.   FOLLOW UP IN 4 MOS.     UPPER ENDOSCOPY AFTER CARE Read the instructions outlined below and refer to this sheet in the next week. These discharge instructions provide you with general information on caring for yourself after you leave the hospital. While your treatment has been planned according to the most current medical practices available, unavoidable complications occasionally occur. If you have any problems or questions after discharge, call DR. Chrislyn Seedorf, [phone].  ACTIVITY  You may resume your regular activity, but move at a slower pace for the next 24 hours.   Take frequent rest periods for the next 24 hours.   Walking will help get rid of the air and reduce the bloated feeling in your belly (abdomen).   No driving for 24 hours (because of the medicine (anesthesia) used during the test).   You may shower.   Do not sign any important legal documents or operate any machinery for 24 hours (because of the anesthesia used during the test).    NUTRITION  Drink plenty of fluids.   You may resume your normal diet as instructed by your doctor.   Begin with a light meal and progress to your normal diet. Heavy or fried foods are harder to digest and may make you feel sick to your stomach (nauseated).   Avoid alcoholic beverages for 24 hours or as instructed.    MEDICATIONS  You may resume your normal medications.   WHAT YOU CAN EXPECT TODAY  Some feelings of bloating in the abdomen.   Passage of more gas than usual.    IF YOU HAD A BIOPSY TAKEN DURING THE UPPER ENDOSCOPY:    Eat a soft diet IF YOU  HAVE NAUSEA, BLOATING, ABDOMINAL PAIN, OR VOMITING.    FINDING OUT THE RESULTS OF YOUR TEST Not all test results are available during your visit. DR. Oneida Alar WILL CALL YOU WITHIN 7 DAYS OF YOUR PROCEDUE WITH YOUR RESULTS. Do not assume everything is normal if you have not heard from DR. Janelie Goltz IN ONE WEEK, CALL HER OFFICE AT [phone].  SEEK IMMEDIATE MEDICAL ATTENTION AND CALL THE OFFICE: [phone] IF:  You have more than a spotting of blood in your stool.   Your belly is swollen (abdominal distention).   You are nauseated or vomiting.   You have a temperature over 101F.   You have abdominal pain or discomfort that is severe or gets worse throughout the day.   Ulcer Disease (Peptic Ulcer, Gastric Ulcer, Duodenal Ulcer) You have an ulcer. This may be in your stomach (gastric ulcer) or in the first part of your small bowel, the duodenum (duodenal ulcer). An ulcer is a break in the lining of the stomach or duodenum. The ulcer causes erosion into the deeper tissue.  CAUSES The stomach has a lining to protect itself from the acid that digests food. The lining can be damaged in two main ways:  The Helico Pylori bacteria (H. Pyolori) can infect the lining of the stomach and cause ulcers.   Nonsteroidal, anti-inflammatory medications (NSAIDS) can cause gastric ulcerations.   Smoking tobacco can increase the acid in the stomach. This  can lead to ulcers, and will impair healing of ulcers.    SYMPTOMS The problems (symptoms) of ulcer disease are usually a burning or gnawing of the mid-upper belly (abdomen). This is often worse on an empty stomach and may get better with food. This may be associated with feeling sick to your stomach (nausea), bloating, and vomiting.  HOME CARE INSTRUCTIONS Continue regular work and usual activities unless advised otherwise by your caregiver.  Avoid tobacco, alcohol, and caffeine. Tobacco use will decrease and slow the rates of healing.   Avoid  foods that seem to aggravate or cause discomfort.    Low-Fat Diet BREADS, CEREALS, PASTA, RICE, DRIED PEAS, AND BEANS These products are high in carbohydrates and most are low in fat. Therefore, they can be increased in the diet as substitutes for fatty foods. They too, however, contain calories and should not be eaten in excess. Cereals can be eaten for snacks as well as for breakfast.   FRUITS AND VEGETABLES It is good to eat fruits and vegetables. Besides being sources of fiber, both are rich in vitamins and some minerals. They help you get the daily allowances of these nutrients. Fruits and vegetables can be used for snacks and desserts.  MEATS Limit lean meat, chicken, [place], and fish to no more than 6 ounces per day. Beef, Pork, and Lamb Use lean cuts of beef, pork, and lamb. Lean cuts include:  Extra-lean ground beef.  Arm roast.  Sirloin tip.  Center-cut ham.  Round steak.  Loin chops.  Rump roast.  Tenderloin.  Trim all fat off the outside of meats before cooking. It is not necessary to severely decrease the intake of red meat, but lean choices should be made. Lean meat is rich in protein and contains a highly absorbable form of iron. Premenopausal women, in particular, should avoid reducing lean red meat because this could increase the risk for low red blood cells (iron-deficiency anemia).  Chicken and [place] These are good sources of protein. The fat of poultry can be reduced by removing the skin and underlying fat layers before cooking. Chicken and [place] can be substituted for lean red meat in the diet. Poultry should not be fried or covered with high-fat sauces. Fish and Shellfish Fish is a good source of protein. Shellfish contain cholesterol, but they usually are low in saturated fatty acids. The preparation of fish is important. Like chicken and [place], they should not be fried or covered with high-fat sauces. EGGS Egg whites contain no fat or cholesterol. They can be  eaten often. Try 1 to 2 egg whites instead of whole eggs in recipes or use egg substitutes that do not contain yolk. MILK AND DAIRY PRODUCTS Use skim or 1% milk instead of 2% or whole milk. Decrease whole milk, natural, and processed cheeses. Use nonfat or low-fat (2%) cottage cheese or low-fat cheeses made from vegetable oils. Choose nonfat or low-fat (1 to 2%) yogurt. Experiment with evaporated skim milk in recipes that call for heavy cream. Substitute low-fat yogurt or low-fat cottage cheese for sour cream in dips and salad dressings. Have at least 2 servings of low-fat dairy products, such as 2 glasses of skim (or 1%) milk each day to help get your daily calcium intake. FATS AND OILS Reduce the total intake of fats, especially saturated fat. Butterfat, lard, and beef fats are high in saturated fat and cholesterol. These should be avoided as much as possible. Vegetable fats do not contain cholesterol, but certain vegetable fats, such  as coconut oil, palm oil, and palm kernel oil are very high in saturated fats. These should be limited. These fats are often used in bakery goods, processed foods, popcorn, oils, and nondairy creamers. Vegetable shortenings and some peanut butters contain hydrogenated oils, which are also saturated fats. Read the labels on these foods and check for saturated vegetable oils. Unsaturated vegetable oils and fats do not raise blood cholesterol. However, they should be limited because they are fats and are high in calories. Total fat should still be limited to 30% of your daily caloric intake. Desirable liquid vegetable oils are corn oil, cottonseed oil, olive oil, canola oil, safflower oil, soybean oil, and sunflower oil. Peanut oil is not as good, but small amounts are acceptable. Buy a heart-healthy tub margarine that has no partially hydrogenated oils in the ingredients. Mayonnaise and salad dressings often are made from unsaturated fats, but they should also be limited because  of their high calorie and fat content. Seeds, nuts, peanut butter, olives, and avocados are high in fat, but the fat is mainly the unsaturated type. These foods should be limited mainly to avoid excess calories and fat. OTHER EATING TIPS Snacks  Most sweets should be limited as snacks. They tend to be rich in calories and fats, and their caloric content outweighs their nutritional value. Some good choices in snacks are graham crackers, melba toast, soda crackers, bagels (no egg), English muffins, fruits, and vegetables. These snacks are preferable to snack crackers, [place] fries, TORTILLA CHIPS, and POTATO chips. Popcorn should be air-popped or cooked in small amounts of liquid vegetable oil. Desserts Eat fruit, low-fat yogurt, and fruit ices instead of pastries, cake, and cookies. Sherbet, angel food cake, gelatin dessert, frozen low-fat yogurt, or other frozen products that do not contain saturated fat (pure fruit juice bars, frozen ice pops) are also acceptable.  COOKING METHODS Choose those methods that use little or no fat. They include: Poaching.  Braising.  Steaming.  Grilling.  Baking.  Stir-frying.  Broiling.  Microwaving.  Foods can be cooked in a nonstick pan without added fat, or use a nonfat cooking spray in regular cookware. Limit fried foods and avoid frying in saturated fat. Add moisture to lean meats by using water, broth, cooking wines, and other nonfat or low-fat sauces along with the cooking methods mentioned above. Soups and stews should be chilled after cooking. The fat that forms on top after a few hours in the refrigerator should be skimmed off. When preparing meals, avoid using excess salt. Salt can contribute to raising blood pressure in some people.  EATING AWAY FROM HOME Order entres, potatoes, and vegetables without sauces or butter. When meat exceeds the size of a deck of cards (3 to 4 ounces), the rest can be taken home for another meal. Choose vegetable or fruit  salads and ask for low-calorie salad dressings to be served on the side. Use dressings sparingly. Limit high-fat toppings, such as bacon, crumbled eggs, cheese, sunflower seeds, and olives. Ask for heart-healthy tub margarine instead of butter.

## 2014-01-08 NOTE — Op Note (Signed)
[place], [id number]   ENDOSCOPY PROCEDURE REPORT  PATIENT: [name], [name].  MR#: [id number] BIRTHDATE: 1965-12-02 , 47  yrs. old GENDER: Male  ENDOSCOPIST: Barney Drain, MD REFERRED ZP:[id number] Satira Anis, M.D.  PROCEDURE DATE: 01/08/2014 PROCEDURE:   EGD w/ biopsy  INDICATIONS:Odynophagia. MEDICATIONS: Demerol 100 mg IV and Versed 8 mg IV TOPICAL ANESTHETIC:   Viscous Xylocaine  DESCRIPTION OF PROCEDURE:     Physical exam was performed.  Informed consent was obtained from the patient after explaining the benefits, risks, and alternatives to the procedure.  The patient was connected to the monitor and placed in the left lateral position.  Continuous oxygen was provided by nasal cannula and IV medicine administered through an indwelling cannula.  After administration of sedation, the patients esophagus was intubated and the EG-[id number] ([id number])  endoscope was advanced under direct visualization to the second portion of the duodenum.  The scope was removed slowly by carefully examining the color, texture, anatomy, and integrity of the mucosa on the way out.  The patient was recovered in endoscopy and discharged home in satisfactory condition.   ESOPHAGUS: The mucosa of the esophagus appeared normal.  Multiple biopsies were performed 20 AND 35 CM FROM THE TEETH. GE JXN 40 CM FROM THE TEETH.   STOMACH: Multiple small ulcers were found in the gastric antrum.  Biopsies were taken around the ulcers.   DUODENUM: The duodenal mucosa showed no abnormalities in the bulb and second portion of the duodenum. COMPLICATIONS:   None  ENDOSCOPIC IMPRESSION: 1.   NO SOURCE FOR ODYNOPHAGIA IDENTIFIED. 2.   Multiple small ulcers in the gastric antrum  RECOMMENDATIONS: AVOID ASPIRIN, BC/GOODY POWDERS, IBUPROFEN/MOTRIN, OR NAPROXEN/ALEVE BECAUSE YOU HAVE A ULCERS IN YOUR STOMACH AND CROHN'S DISEASE. USE TYLENOL AS NEEDED FOR PAIN. CONTINUE  PROTONIX. FOLLOW A LOW FAT DIET. CHECK CMP/CBC TODAY. FOLLOW UP IN 4 MOS.   REPEAT EXAM:   _______________________________ Lorrin MaisBarney Drain, MD 01/08/2014 3:13 PM

## 2014-01-08 NOTE — H&P (View-Only) (Signed)
Subjective:    Patient ID: [name], male    DOB: [birth year], 48 y.o.   MRN: [id number] [name], MD  HPI GETS DIARRHEA WHEN HE TAKES HIS AM MEDS. TAKING KCL WITH AM MEDS. IF HE DOESN'T TAKE AM MEDS, HE HAS NL FORMED STOOL. BLOOD IN BAGx1. PT DENIES FEVER, CHILLS, nausea, vomiting, melena, constipation, abd pain, OR problems swallowing. Heartburn: 3X/DAY.  YESTERDAY BAKED CHICKEN CREAM POTATOES. NO SNACKS. DRINKING WATER AND CRANBERRY JUICE. OFF SODAS. HAS BURNING IN HIS CHEST. FEELS LIKE IT'S ON FIRE. A COUPLE OF HOURS. PAIN WHEN HE SWALLOWS WITH PILLS AND FOOD. NOW TAKE PILLS ONE AT A TIME DUE TO BURNING IN HIS CHEST. NO PROBLEMS WITH SEDATION.  Past Medical History  Diagnosis Date  . Crohn's colitis     s/p total colectomy with ileostomy in 2009  . Nystagmus   . Bipolar disorder   . Asthma   . GERD (gastroesophageal reflux disease)   . Hyperlipidemia   . Insomnia   . Enterococcus UTI 2009  . 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 DVT (deep venous thrombosis) 2009    right upper extremity due to PICC  . S/P endoscopy Sept 2012    mild gastritis, small hiatal hernia, no ulcers  . Crohn's disease of small intestine Sept 2012    ileoscopy: multiple ulcers likely secondary to Crohn's  . Schizophrenia, acute   . Migraine headache   . Hernia   . Colostomy in place     Past Surgical History  Procedure Laterality Date  . Total colectomy  2009    with ileostomy at [facility] for refractory disease   . Total shoulder replacement      bilateral  . Total hip arthroplasty      right, due to avascular necrosis from chronic prednisone  . Kidney surgery    . Appendectomy    . Eye surgery    . Esophagogastroduodenoscopy  07/2010    gastritis,  bx neg for H.Pylori  . Small bowel capsule study  08/2010    few tiny nonbleeding erosions/ulcers ?secondary to relafen or Crohn's. Entire small bowel not seen.   . Ileoscopy  06/29/2011      SLF: ulcers,multiple/small HH/mild gastritis  . Laparotomy N/A 06/05/2013    Procedure: EXPLORATORY LAPAROTOMY;  Surgeon: Donato Heinz, MD;  Location: [facility];  Service: General;  Laterality: N/A;  . Lysis of adhesion N/A 06/05/2013    Procedure: LYSIS OF ADHESIONS;  Surgeon: Donato Heinz, MD;  Location: [facility];  Service: General;  Laterality: N/A;   Allergies  Allergen Reactions  . Aspirin     REACTION: unknown reaction  . Bactrim [Sulfamethoxazole-Tmp Ds]     ABD CRAMPS AND DIARRHEA  . Codeine     REACTION: unknown reaction  . Ibuprofen     REACTION: unknown reaction  . Penicillins     REACTION: unknown reaction  . Remicade [Infliximab]     COULDN'T BREATHE  . Tramadol Hcl     REACTION: unknown reaction  . Vicodin [Hydrocodone-Acetaminophen] Nausea Only    Current Outpatient Prescriptions  Medication Sig Dispense Refill  . benztropine (COGENTIN) 0.5 MG tablet Take 0.5 mg by mouth daily.       . Calcium Carbonate-Vitamin D (CALCIUM 600 + D PO) Take 2 tablets by mouth 2 (two) times daily.       . divalproex (DEPAKOTE ER) 250 MG 24 hr  tablet Take 250 mg by mouth at bedtime.       . divalproex (DEPAKOTE) 500 MG 24 hr tablet Take 1,000 mg by mouth every evening.       . folic acid (FOLVITE) 1 MG tablet Take 1 mg by mouth daily.        . furosemide (LASIX) 40 MG tablet Take 40 mg by mouth daily.        Marland Kitchen gabapentin (NEURONTIN) 300 MG capsule Take 900 mg by mouth at bedtime.       . mercaptopurine (PURINETHOL) 50 MG tablet 2 PO DAILY.TAKE on an empty stomach 1 hour before or 2 hours after meals.    . metoprolol succinate (TOPROL-XL) 25 MG 24 hr tablet Take 25 mg by mouth daily.       . pantoprazole (PROTONIX) 40 MG tablet TAKE (1) TABLET BY MOUTH DAILY BEFORE BREAKFAST.    Marland Kitchen potassium chloride SA (K-DUR,KLOR-CON) 20 MEQ tablet Take 20 mEq by mouth daily.     . pravastatin (PRAVACHOL) 20 MG tablet Take 20 mg by mouth at bedtime.     Marland Kitchen rOPINIRole (REQUIP) 2 MG tablet Take 2 mg by  mouth at bedtime.    . sertraline (ZOLOFT) 50 MG tablet Take 50 mg by mouth daily.    . Tamsulosin HCl (FLOMAX) 0.4 MG CAPS Take 0.4 mg by mouth daily.      . traZODone (DESYREL) 100 MG tablet Take 100 mg by mouth at bedtime.    Marland Kitchen acetaminophen (TYLENOL) 500 MG tablet Take 1,000 mg by mouth every 6 (six) hours as needed. For pain     . albuterol (PROAIR HFA) 108 (90 BASE) MCG/ACT inhaler Inhale 2 puffs into the lungs every 6 (six) hours as needed for wheezing or shortness of breath.    .      .      . HYDROcodone-acetaminophen (NORCO/VICODIN) 5-325 MG per tablet Take 1-2 tablets by mouth every 4 (four) hours as needed.    .      .      .        . risperidone (RISPERDAL) 4 MG tablet Take 8 mg by mouth at bedtime.          Review of Systems     Objective:   Physical Exam  Vitals reviewed. Constitutional: He is oriented to person, place, and time. He appears well-nourished. No distress.  HENT:  Head: Normocephalic and atraumatic.  Mouth/Throat: Oropharynx is clear and moist. No oropharyngeal exudate.  Eyes: Pupils are equal, round, and reactive to light. No scleral icterus.  Neck: Normal range of motion. Neck supple.  Cardiovascular: Normal rate, regular rhythm and normal heart sounds.   Pulmonary/Chest: Effort normal and breath sounds normal. No respiratory distress.  Abdominal: Soft. Bowel sounds are normal. He exhibits no distension. There is no tenderness.  OSTOMY IN RLQ-BROWN STOOL IN BAG, NO BLOOD.  Musculoskeletal: He exhibits no edema.  Lymphadenopathy:    He has no cervical adenopathy.  Neurological: He is alert and oriented to person, place, and time.  NO  NEW FOCAL DEFICITS   Psychiatric: He has a normal mood and affect.          Assessment & Plan:

## 2014-01-10 ENCOUNTER — Telehealth: Payer: Self-pay

## 2014-01-10 DIAGNOSIS — K5000 Crohn's disease of small intestine without complications: Secondary | ICD-10-CM

## 2014-01-10 NOTE — Telephone Encounter (Addendum)
Pt called to see if it would be OK to eat Popcorn. Per Dr. Oneida Alar, [place] to try it if it does not call bleeding.  I called the pt back and informed him. I told him to make sure he doesn't try anything else new when he tries it, so if it causes problems it can be pinpointed.

## 2014-01-11 ENCOUNTER — Encounter (HOSPITAL_COMMUNITY): Payer: Self-pay | Admitting: Gastroenterology

## 2014-01-28 NOTE — Telephone Encounter (Addendum)
Please call pt. His stomach Bx shows gastritis/NSAID INDUCED ULCERS. THE ESOPHAGUS BIOPSIES ARE NORMAL. NO OBVIOUS SOURCE FOR PAIN WITH SWALLOWING WAS IDENTIFIED. IT MAY HAVE BEEN DUE TO A PILL NOT GOING DOWN ALL THE WAY AND LAYING IN HIS ESOPHAGUS WHICH CAN CAUSE IRRITATION AND PAIN IN THE ESOPHAGUS. HIS CMP AND CBC ARE ESSENTIALLY NORMAL.   AVOID ASPIRIN, BC/GOODY POWDERS, IBUPROFEN/MOTRIN, OR NAPROXEN/ALEVE BECAUSE YOU HAVE A ULCERS IN YOUR STOMACH AND CROHN'S DISEASE. USE TYLENOL AS NEEDED FOR PAIN. CONTINUE PROTONIX.  FOLLOW A LOW FAT DIET.  FOLLOW UP IN AUG 2015 E30 ULCERS/CROHN'S/ODYNOPHAGIA. REPEAT EGD/CMP/CBC AFTER NEXT OPV.

## 2014-01-28 NOTE — Telephone Encounter (Signed)
Reminder in epic °

## 2014-01-29 NOTE — Telephone Encounter (Signed)
Pt aware of results 

## 2014-02-28 ENCOUNTER — Telehealth: Payer: Self-pay

## 2014-02-28 NOTE — Telephone Encounter (Signed)
I spoke to Neil Crouch, PA who said that it would be fine for the pt to do Miralax once daily for 3-4 days. Pt is aware.

## 2014-02-28 NOTE — Telephone Encounter (Signed)
Agree 

## 2014-02-28 NOTE — Telephone Encounter (Signed)
VM from pt, said he has been a little constipated and is it OK to use Miralax?

## 2014-03-04 ENCOUNTER — Ambulatory Visit: Payer: Medicaid Other | Admitting: Gastroenterology

## 2014-03-05 ENCOUNTER — Encounter: Payer: Self-pay | Admitting: Gastroenterology

## 2014-03-05 ENCOUNTER — Encounter (HOSPITAL_COMMUNITY): Payer: Self-pay

## 2014-03-05 ENCOUNTER — Ambulatory Visit (INDEPENDENT_AMBULATORY_CARE_PROVIDER_SITE_OTHER): Payer: Medicaid Other | Admitting: Gastroenterology

## 2014-03-05 ENCOUNTER — Ambulatory Visit (HOSPITAL_COMMUNITY)
Admission: RE | Admit: 2014-03-05 | Discharge: 2014-03-05 | Disposition: A | Discharge status: Home / Self Care | Payer: Medicaid Other | Source: Ambulatory Visit | Attending: Gastroenterology | Admitting: Gastroenterology

## 2014-03-05 VITALS — BP 106/72 | HR 76 | Temp 97.5°F | Resp 18 | Ht 66.0 in | Wt 235.8 lb

## 2014-03-05 DIAGNOSIS — R52 Pain, unspecified: Secondary | ICD-10-CM

## 2014-03-05 DIAGNOSIS — Z932 Ileostomy status: Secondary | ICD-10-CM | POA: Insufficient documentation

## 2014-03-05 DIAGNOSIS — R1084 Generalized abdominal pain: Secondary | ICD-10-CM | POA: Insufficient documentation

## 2014-03-05 DIAGNOSIS — R109 Unspecified abdominal pain: Secondary | ICD-10-CM | POA: Insufficient documentation

## 2014-03-05 DIAGNOSIS — Z9049 Acquired absence of other specified parts of digestive tract: Secondary | ICD-10-CM | POA: Insufficient documentation

## 2014-03-05 DIAGNOSIS — R10819 Abdominal tenderness, unspecified site: Secondary | ICD-10-CM | POA: Insufficient documentation

## 2014-03-05 LAB — COMPLETE METABOLIC PANEL WITH GFR
ALBUMIN: 3.8 g/dL (ref 3.5–5.2)
ALK PHOS: 70 U/L (ref 39–117)
ALT: 64 U/L — ABNORMAL HIGH (ref 0–53)
AST: 45 U/L — ABNORMAL HIGH (ref 0–37)
BUN: 9 mg/dL (ref 6–23)
CO2: 28 meq/L (ref 19–32)
Calcium: 9.9 mg/dL (ref 8.4–10.5)
Chloride: 96 mEq/L (ref 96–112)
Creat: 1.03 mg/dL (ref 0.50–1.35)
GFR, EST NON AFRICAN AMERICAN: 85 mL/min
GFR, Est African American: >89 mL/min
Glucose, Bld: 79 mg/dL (ref 70–99)
POTASSIUM: 4.3 meq/L (ref 3.5–5.3)
SODIUM: 141 meq/L (ref 135–145)
TOTAL PROTEIN: 7.2 g/dL (ref 6.0–8.3)
Total Bilirubin: 0.9 mg/dL (ref 0.3–1.2)

## 2014-03-05 LAB — CBC WITH DIFFERENTIAL/PLATELET
BASOS ABS: 0 10*3/uL (ref 0.0–0.1)
BASOS PCT: 0 % (ref 0–1)
EOS ABS: 0.1 10*3/uL (ref 0.0–0.7)
Eosinophils Relative: 1 % (ref 0–5)
HEMATOCRIT: 46.3 % (ref 39.0–52.0)
Hemoglobin: 15.7 g/dL (ref 13.0–17.0)
Lymphocytes Relative: 22 % (ref 12–46)
Lymphs Abs: 1.2 10*3/uL (ref 0.7–4.0)
MCH: 34.4 pg — AB (ref 26.0–34.0)
MCHC: 33.9 g/dL (ref 30.0–36.0)
MCV: 101.5 fL — ABNORMAL HIGH (ref 78.0–100.0)
MONO ABS: 0.6 10*3/uL (ref 0.1–1.0)
Monocytes Relative: 11 % (ref 3–12)
NEUTROS ABS: 3.7 10*3/uL (ref 1.7–7.7)
Neutrophils Relative %: 66 % (ref 43–77)
Platelets: 198 10*3/uL (ref 150–400)
RBC: 4.56 MIL/uL (ref 4.22–5.81)
RDW: 14.3 % (ref 11.5–15.5)
WBC: 5.6 10*3/uL (ref 4.0–10.5)

## 2014-03-05 LAB — URINALYSIS, ROUTINE W REFLEX MICROSCOPIC
BILIRUBIN URINE: NEGATIVE
Glucose, UA: NEGATIVE mg/dL
Hgb urine dipstick: NEGATIVE
KETONES UR: NEGATIVE mg/dL
Leukocytes, UA: NEGATIVE
Nitrite: NEGATIVE
PROTEIN: NEGATIVE mg/dL
Specific Gravity, Urine: 1.015 (ref 1.005–1.030)
Urobilinogen, UA: 0.2 mg/dL (ref 0.0–1.0)
pH: 5.5 (ref 5.0–8.0)

## 2014-03-05 LAB — LIPASE: Lipase: 42 U/L (ref 11–59)

## 2014-03-05 MED ORDER — IOHEXOL 300 MG/ML  SOLN
100.0000 mL | Freq: Once | INTRAMUSCULAR | Status: AC | PRN
  Administered 2014-03-05: 100 mL via INTRAVENOUS

## 2014-03-05 NOTE — Assessment & Plan Note (Addendum)
STARTING IN LLQ AND AROUND STOMA. NOW GENERALIZED. DIFFERENTIAL DIAGNOSIS INCLUDES INCARCERATED HERNIA WITH SMALL BOWEL OBSTRUCTION, OR ACUTE PYLEONEPHRITIS, LESS LIKELY CROHN'S FLARE.  STAT CMP/CBC/UA TODAY CONTINUE BACTRIM  AND IMRUAN FOR NOW. CT ABD/PELVIS TODAY-CALL DR. Loren Vicens WITH RESULTS OPV IN 2 MOS

## 2014-03-05 NOTE — Progress Notes (Addendum)
Subjective:    Patient ID: [name], male    DOB: May 24, 1966, 48 y.o.   MRN: [id number]  CLAGGETT,ELIN, PA-C  HPI SX STARTED ONE WEEK AGO. SX FEEL WORSE. SHARP/KNIFELIKE. VOMITING 2-3 TIMES A DAY AFTER HE EATS. NAUSEA: OFF AND ON-HOURS AT A TIME. NO BLOOD DIN VOMIT. TEMP AS HIGH AS 95F. STOMACH HURTS ON LEFT AND THEN MOVES ALL OVER. NO MELENA OR BRBPB IN BAG. EMPTYING BAG ONLY TWICE A DAY. LAST EVAL BY UROLOGY AND PUT ON BACTRIM 5/26. HAD BEEN OFF ABX FOR ~1 MO. MAY NEED TO BE DILATED. GETTING HEARTBURN OFF AND ON. GAINED 2 LBS SINCE MAR 2015. HAS FELT FOR A WEEK. NOW HAVING PAIN AROUND STOMA AND LEFT SIDE. LAST ABD SURGERY AUG 2014: LYSIS OF ADHESIONS AND CT DEC 2014: PERI-STOMAL HERNIA-NO OBSTRUCTION/BOWEL WALL THICKENING. NEEDS RX TO NOT WEAR SEATBELT BECAUSE IT MAKES IT BLOW UP AND BUST ON HIM. ALS NEEDS HANDICAP STICKER.    Past Medical History  Diagnosis Date  . Crohn's colitis     s/p total colectomy with ileostomy in 2009  . Nystagmus   . Bipolar disorder   . Asthma   . GERD (gastroesophageal reflux disease)   . Hyperlipidemia   . Insomnia   . Enterococcus UTI 2009  . 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 DVT (deep venous thrombosis) 2009    right upper extremity due to PICC  . S/P endoscopy Sept 2012    mild gastritis, small hiatal hernia, no ulcers  . Crohn's disease of small intestine Sept 2012    ileoscopy: multiple ulcers likely secondary to Crohn's  . Schizophrenia, acute   . Migraine headache   . Hernia   . Colostomy in place   . Depression   . Schizophrenia   . Arthritis     Past Surgical History  Procedure Laterality Date  . Total colectomy  2009    with ileostomy at [facility] for refractory disease   . Total shoulder replacement      bilateral  . Total hip arthroplasty      right, due to avascular necrosis from chronic prednisone  . Kidney surgery    . Appendectomy    . Eye surgery    .  Esophagogastroduodenoscopy  07/2010    gastritis,  bx neg for H.Pylori  . Small bowel capsule study  08/2010    few tiny nonbleeding erosions/ulcers ?secondary to relafen or Crohn's. Entire small bowel not seen.   . Ileoscopy  06/29/2011    SLF: ulcers,multiple/small HH/mild gastritis  . Laparotomy N/A 06/05/2013    Procedure: EXPLORATORY LAPAROTOMY;  Surgeon: Donato Heinz, MD;  Location: [facility];  Service: General;  Laterality: N/A;  . Lysis of adhesion N/A 06/05/2013    Procedure: LYSIS OF ADHESIONS;  Surgeon: Donato Heinz, MD;  Location: [facility];  Service: General;  Laterality: N/A;  . Esophagogastroduodenoscopy N/A 01/08/2014    SLF:NO SOURCE FOR ODYNOPHAGIA IDENTIFIED/Multiple small ulcers in the gastric antrum    Allergies  Allergen Reactions  . Aspirin     REACTION: unknown reaction  . Bactrim [Sulfamethoxazole-Tmp Ds]     ABD CRAMPS AND DIARRHEA  . Codeine     REACTION: unknown reaction  . Ibuprofen     REACTION: unknown reaction  . Penicillins     REACTION: unknown reaction  . Remicade [Infliximab]     COULDN'T BREATHE  . Tramadol Hcl  REACTION: unknown reaction  . Vicodin [Hydrocodone-Acetaminophen] Nausea Only    Current Outpatient Prescriptions  Medication Sig Dispense Refill  . acetaminophen (TYLENOL) 500 MG tablet Take 1,000 mg by mouth every 6 (six) hours as needed. For pain       . albuterol (PROAIR HFA) 108 (90 BASE) MCG/ACT inhaler Inhale 2 puffs into the lungs every 6 (six) hours as needed for wheezing or shortness of breath.      . benztropine (COGENTIN) 0.5 MG tablet Take 0.5 mg by mouth daily.       . Calcium Carbonate-Vitamin D (CALCIUM 600 + D PO) Take 2 tablets by mouth 2 (two) times daily.       . divalproex (DEPAKOTE ER) 250 MG 24 hr tablet Take 250 mg by mouth at bedtime.       . divalproex (DEPAKOTE) 500 MG 24 hr tablet Take 1,000 mg by mouth every evening.       . folic acid (FOLVITE) 1 MG tablet Take 1 mg by mouth daily.        . furosemide  (LASIX) 40 MG tablet Take 40 mg by mouth daily.        Marland Kitchen gabapentin (NEURONTIN) 300 MG capsule Take 900 mg by mouth at bedtime.     . mercaptopurine (PURINETHOL) 50 MG tablet 2 PO DAILY.TAKE on an empty stomach 1 hour before or 2 hours after meals.    . pantoprazole (PROTONIX) 40 MG tablet TAKE (1) TABLET BY MOUTH DAILY BEFORE BREAKFAST.    Marland Kitchen potassium chloride SA (K-DUR,KLOR-CON) 20 MEQ tablet Take 20 mEq by mouth daily.     . pravastatin (PRAVACHOL) 20 MG tablet Take 20 mg by mouth at bedtime.       Marland Kitchen rOPINIRole (REQUIP) 2 MG tablet Take 2 mg by mouth at bedtime.      . sertraline (ZOLOFT) 50 MG tablet Take 50 mg by mouth daily.      Marland Kitchen sulfamethoxazole-trimethoprim (BACTRIM DS) 800-160 MG per tablet Take 1 tablet by mouth 2 (two) times daily.      . Tamsulosin HCl (FLOMAX) 0.4 MG CAPS Take 0.4 mg by mouth daily.        . traZODone (DESYREL) 100 MG tablet Take 100 mg by mouth at bedtime.          Review of Systems     Objective:   Physical Exam  Vitals reviewed. Constitutional: He is oriented to person, place, and time. He appears well-nourished.  MILDLY DISTRESSED  HENT:  Head: Normocephalic and atraumatic.  Mouth/Throat: No oropharyngeal exudate.  Eyes: Pupils are equal, round, and reactive to light. No scleral icterus.  Neck: Normal range of motion. Neck supple.  Cardiovascular: Normal rate, regular rhythm and normal heart sounds.   Pulmonary/Chest: Effort normal and breath sounds normal. No respiratory distress.  Abdominal: Bowel sounds are normal. He exhibits distension (MILD). There is no tenderness (SEVERE IN ALL FOUR QUADRANTS AND AT STOMA. STOMA VIOLET NOT PINK). Guarding: MILD.  Musculoskeletal: He exhibits no edema.  Lymphadenopathy:    He has no cervical adenopathy.  Neurological: He is alert and oriented to person, place, and time.  NO  NEW FOCAL DEFICITS   Psychiatric:  FLAT AFFECT, NL MOOD           Assessment & Plan:

## 2014-03-05 NOTE — Patient Instructions (Signed)
STAT BLOOD AND URINE TODAY  CONTINUE BACTRIM  AND IMRUAN FOR NOW.  GET CT SCAN. STAY IN RADIOLOGY AND THEY WILL CALL DR. Krystl Wickware WITH RESULTS  FOLLOW UP IN 2 MOS.

## 2014-03-06 ENCOUNTER — Telehealth: Payer: Self-pay

## 2014-03-06 NOTE — Telephone Encounter (Signed)
SEE BOTTOM OF OPV JUN 2.

## 2014-03-06 NOTE — Progress Notes (Signed)
PLEASE CALL PT. HIS LABS ONLY SHOW A MILDLY ELEVATED LIVER ENZYMES. HIS URINE TEST IS NORMAL. HIS CT SHOWS A HERNIA AT HIS STOMA BUT HE DOES NOT HAVE BLOCKAGE OR ACTIVE CROHN'S DISEASE. NO SOURCE FOR HIS ABDOMINAL PAIN WAS IDENTIFIED.  IT MAY BE GI UPSET FROM ABX. HE SHOULD START A PROBIOTIC DAILY([employer]) FOR 3 MOS.

## 2014-03-06 NOTE — Telephone Encounter (Signed)
Pt calling to see if we have his results from his CT scan done Tuesday and his blood work. Please advise

## 2014-03-06 NOTE — Telephone Encounter (Signed)
Pt is aware of results. 

## 2014-03-20 NOTE — Progress Notes (Signed)
Called and informed pt. Lab order on file for August 2015.

## 2014-03-20 NOTE — Progress Notes (Signed)
REVIEWED AST/ALT.   PLEASE CALL PT. HIS WEIGHT HAS INCREASED FROM JUL 2013 205 LBS TO MAY 2015: 235 LBS AND ASSOCIATED WITH ELEVATED LIVER ENZYMES(TRANSAMINITIS). IT IS  MOST LIKELY DUE TO FAT INHIS LIVER WHICH CAN CUASE CIRRHOSIS. IT LESS LIKELY TO BE DUE TO IMURAN OR PRAVACHOL.   PT SHOULD FOLLOW A LOW FAT DIET. LOSE 10 LBS AND OPV AUG 2015 E30 30 CROHN'S ABD PAIN, ELEVATED LIVER ENZYMES. NEEDS REPEAT HFP 1 WEEK PRIOR TO OPV.

## 2014-03-20 NOTE — Addendum Note (Signed)
Addended by: Danie Binder on: 03/20/2014 01:15 PM   Modules accepted: Orders

## 2014-03-21 ENCOUNTER — Telehealth: Payer: Self-pay | Admitting: *Deleted

## 2014-03-21 NOTE — Telephone Encounter (Signed)
I called pt. He decided for [place] to mail his lab orders to him and not the [facility] in August. I made a notation on his lab order in lab box.

## 2014-03-21 NOTE — Progress Notes (Signed)
Reminder in [place].

## 2014-03-21 NOTE — Telephone Encounter (Signed)
Pt called wanting to speak with Tamela Oddi, pt stated that nothing is wrong he just has something to tell Doris. Please advise [phone] or [phone]

## 2014-03-22 NOTE — Progress Notes (Signed)
Reminder in epic °

## 2014-04-11 ENCOUNTER — Telehealth: Payer: Self-pay | Admitting: *Deleted

## 2014-04-11 NOTE — Telephone Encounter (Signed)
I called pt. He said he started having pain in his rectum on Tuesday this week and the pain has been bad at times.  This morning some "brown stuff" and "blood" came out and he wants to know what he needs to do.  I informed Dr. Oneida Alar on the phone and she will be reviewing and advise!

## 2014-04-11 NOTE — Telephone Encounter (Signed)
Pt called stating he had the bag put on and his rectum is sewed up, pt said for the last 3 days he has had pain and starting today pt has had blood and brown stuff coming out of his rectum, I scheduled pt for 04/29/14 with Laban Emperor NP at 2:30. Pt would like to speak with the nurse [phone].

## 2014-04-12 NOTE — Telephone Encounter (Signed)
PLEASE CALL PT. HE NEEDS AN APPT WITH [facility] ASAP, Dx: rectal bleeding, PSHX: TOTAL COLECTOMY 2009.

## 2014-04-12 NOTE — Telephone Encounter (Signed)
I called and informed pt. He said he has just a little brown on his pull up now, and it is a little painful but noting like it was.  OK to make ASAP appt. He said it would be better if at all possible to have afternoon appt for his ride. Said they have meetings in the AM.

## 2014-04-15 NOTE — Telephone Encounter (Signed)
Churchill is scheduled with Dr. Morton Stall Thursday July 23rd at 3:00 at [facility]

## 2014-04-22 ENCOUNTER — Other Ambulatory Visit: Payer: Self-pay | Admitting: *Deleted

## 2014-04-22 DIAGNOSIS — R1084 Generalized abdominal pain: Secondary | ICD-10-CM

## 2014-04-29 ENCOUNTER — Ambulatory Visit: Payer: Medicaid Other | Admitting: Gastroenterology

## 2014-04-29 ENCOUNTER — Telehealth: Payer: Self-pay

## 2014-04-29 NOTE — Telephone Encounter (Signed)
Pt called to ask when he should do labs, he received the orders in the mail today.  I told him a week before his appt for August ( which he has not scheduled yet). He will schedule that and then go.  FYI  He did not know he had appt for 2:30 PM this afternoon with Laban Emperor, NP. He said he cannot come.  FYI to Dr. Hunt Oris had to reschedule his appt to July 31. The doctor had an emergency.

## 2014-04-29 NOTE — Telephone Encounter (Signed)
That's ok. Sounds good.

## 2014-05-08 ENCOUNTER — Other Ambulatory Visit: Payer: Self-pay

## 2014-05-08 MED ORDER — MERCAPTOPURINE 50 MG PO TABS: ORAL_TABLET | ORAL | Status: DC

## 2014-05-23 LAB — HEPATIC FUNCTION PANEL
ALBUMIN: 4.2 g/dL (ref 3.5–5.2)
ALT: 30 U/L (ref 0–53)
AST: 29 U/L (ref 0–37)
Alkaline Phosphatase: 52 U/L (ref 39–117)
BILIRUBIN INDIRECT: 1 mg/dL (ref 0.2–1.2)
Bilirubin, Direct: 0.2 mg/dL (ref 0.0–0.3)
Total Bilirubin: 1.2 mg/dL (ref 0.2–1.2)
Total Protein: 6.7 g/dL (ref 6.0–8.3)

## 2014-05-29 ENCOUNTER — Encounter (INDEPENDENT_AMBULATORY_CARE_PROVIDER_SITE_OTHER): Payer: Self-pay

## 2014-05-29 ENCOUNTER — Other Ambulatory Visit: Payer: Self-pay | Admitting: Gastroenterology

## 2014-05-29 ENCOUNTER — Encounter: Payer: Self-pay | Admitting: Gastroenterology

## 2014-05-29 ENCOUNTER — Ambulatory Visit (INDEPENDENT_AMBULATORY_CARE_PROVIDER_SITE_OTHER): Payer: Medicaid Other | Admitting: Gastroenterology

## 2014-05-29 VITALS — BP 119/83 | HR 78 | Temp 97.2°F | Ht 66.0 in | Wt 239.0 lb

## 2014-05-29 DIAGNOSIS — R339 Retention of urine, unspecified: Secondary | ICD-10-CM

## 2014-05-29 DIAGNOSIS — K5000 Crohn's disease of small intestine without complications: Secondary | ICD-10-CM

## 2014-05-29 DIAGNOSIS — N390 Urinary tract infection, site not specified: Secondary | ICD-10-CM

## 2014-05-29 NOTE — Patient Instructions (Addendum)
CONTINUE MERCAPTOPURINE  SEE [facility] FOR URINARY COMPLAINTS.  FOLLOW UP IN DEC 2015. REPEAT CBC/HFP 1 WEEK PRIOR TO APPT.

## 2014-05-29 NOTE — Assessment & Plan Note (Signed)
SX NOT IDEALLY CONTROLLED  MOST LIKELY DUE TO VIRAL ILLNESS OR UTI.   CONTINUE MERCAPTOPURINE SEE [facility] FOR URINARY COMPLAINTS. OPV IN 4 MOS. REPEAT CBC/HFP 1 WEEK PRIOR TO APPT.

## 2014-05-29 NOTE — Progress Notes (Signed)
CC TO PCP

## 2014-05-29 NOTE — Progress Notes (Signed)
Subjective:    Patient ID: [name], male    DOB: 11-Apr-1966, 48 y.o.   MRN: [id number]  CLAGGETT,ELIN, PA-C  HPI Started feeling bad 2 days ago. Had vomiting: 3x on TUES, BUT NONE TODAY. HAVING CHANGE IN STOOL CONSISTENCY ASSOCIATED WITH [employer]. HASN'T QUIT SMOKING YET. WAS UP TO 248 LBS AND NOW 239 LBS. FRIENDS IS HELPING HIM CONTROL HIS PORTIONS.  HAS AN AIDE. LEFT THE LADY FRIENDS ALONE. SEEING DR. Michela Pitcher FOR HIS URINARY PROBLEMS. STARTED VESICARE BUT IT HASN'T HELPED. WAS DILATED HIS URETHRA IN THE OFFICE ABOUT 1 MO AGO. DYSURIA/CAN'T EMPTY BLADDER AND HAS BEEN TREATED FOR UTI SINCE 1 YEAR AGO. ONLY GETS BETTER WITH ABX. Case complicated by USE OF MERCAPTOPURINE. LOWER PUBIC/RLQ/LOWER BACK PAIN. HAS SEEN 2 UROLOGIST BEFORE. 90 DAYS FREE FROM MARIJUANA AND NARCOTICS.  Past Medical History  Diagnosis Date  . Crohn's colitis     s/p total colectomy with ileostomy in 2009  . Nystagmus   . Bipolar disorder   . Asthma   . GERD (gastroesophageal reflux disease)   . Hyperlipidemia   . Insomnia   . Enterococcus UTI 2009  . 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 DVT (deep venous thrombosis) 2009    right upper extremity due to PICC  . S/P endoscopy Sept 2012    mild gastritis, small hiatal hernia, no ulcers  . Crohn's disease of small intestine Sept 2012    ileoscopy: multiple ulcers likely secondary to Crohn's  . Schizophrenia, acute   . Migraine headache   . Hernia   . Colostomy in place   . Depression   . Schizophrenia   . Arthritis    Past Surgical History  Procedure Laterality Date  . Total colectomy  2009    with ileostomy at [facility] for refractory disease   . Total shoulder replacement      bilateral  . Total hip arthroplasty      right, due to avascular necrosis from chronic prednisone  . Kidney surgery    . Appendectomy    . Eye surgery    . Esophagogastroduodenoscopy  07/2010    gastritis,  bx neg for H.Pylori    . Small bowel capsule study  08/2010    few tiny nonbleeding erosions/ulcers ?secondary to relafen or Crohn's. Entire small bowel not seen.   . Ileoscopy  06/29/2011    SLF: ulcers,multiple/small HH/mild gastritis  . Laparotomy N/A 06/05/2013    Procedure: EXPLORATORY LAPAROTOMY;  Surgeon: Donato Heinz, MD;  Location: [facility];  Service: General;  Laterality: N/A;  . Lysis of adhesion N/A 06/05/2013    Procedure: LYSIS OF ADHESIONS;  Surgeon: Donato Heinz, MD;  Location: [facility];  Service: General;  Laterality: N/A;  . Esophagogastroduodenoscopy N/A 01/08/2014    SLF:NO SOURCE FOR ODYNOPHAGIA IDENTIFIED/Multiple small ulcers in the gastric antrum   Allergies  Allergen Reactions  . Aspirin     REACTION: unknown reaction  . Bactrim [Sulfamethoxazole-Tmp Ds]     ABD CRAMPS AND DIARRHEA  . Codeine     REACTION: unknown reaction  . Ibuprofen     REACTION: unknown reaction  . Penicillins     REACTION: unknown reaction  . Remicade [Infliximab]     COULDN'T BREATHE  . Tramadol Hcl     REACTION: unknown reaction  . Vicodin [Hydrocodone-Acetaminophen] Nausea Only    Current Outpatient Prescriptions  Medication Sig Dispense Refill  . acetaminophen (  TYLENOL) 500 MG tablet Take 1,000 mg by mouth every 6 (six) hours as needed. For pain     . albuterol (PROAIR HFA) 108 (90 BASE) MCG/ACT inhaler Inhale 2 puffs into the lungs every 6 (six) hours as needed for wheezing or shortness of breath.    Marland Kitchen albuterol (PROVENTIL) (2.5 MG/3ML) 0.083% nebulizer solution Take 2.5 mg by nebulization every 6 (six) hours as needed for wheezing or shortness of breath.    . benztropine (COGENTIN) 0.5 MG tablet Take 0.5 mg by mouth daily.     . Calcium Carbonate-Vitamin D (CALCIUM 600 + D PO) Take 2 tablets by mouth 2 (two) times daily.     . divalproex (DEPAKOTE ER) 250 MG 24 hr tablet Take 250 mg by mouth at bedtime.     . divalproex (DEPAKOTE) 500 MG 24 hr tablet Take 1,000 mg by mouth every evening.     . folic  acid (FOLVITE) 1 MG tablet Take 1 mg by mouth daily.      . furosemide (LASIX) 40 MG tablet Take 40 mg by mouth daily.      Marland Kitchen gabapentin (NEURONTIN) 300 MG capsule Take 900 mg by mouth at bedtime.     . mercaptopurine (PURINETHOL) 50 MG tablet 2 PO DAILY.TAKE on an empty stomach 1 hour before or 2 hours after meals.    . pantoprazole (PROTONIX) 40 MG tablet TAKE (1) TABLET BY MOUTH DAILY BEFORE BREAKFAST.    Marland Kitchen potassium chloride SA (K-DUR,KLOR-CON) 20 MEQ tablet Take 20 mEq by mouth daily.     . pravastatin (PRAVACHOL) 20 MG tablet Take 20 mg by mouth at bedtime.     Marland Kitchen rOPINIRole (REQUIP) 2 MG tablet Take 2 mg by mouth at bedtime.    . sertraline (ZOLOFT) 50 MG tablet Take 50 mg by mouth daily.    . Tamsulosin HCl (FLOMAX) 0.4 MG CAPS Take 0.4 mg by mouth daily.      . traZODone (DESYREL) 100 MG tablet Take 100 mg by mouth at bedtime.    .         Review of Systems PER HPI OTHERWISE ALL SYSTEMS ARE NEGATIVE.     Objective:   Physical Exam  Vitals reviewed. Constitutional: He is oriented to person, place, and time. He appears well-developed and well-nourished. No distress.  HENT:  Head: Normocephalic and atraumatic.  Mouth/Throat: Oropharynx is clear and moist. No oropharyngeal exudate.  Eyes: Pupils are equal, round, and reactive to light. No scleral icterus.  Neck: Normal range of motion. Neck supple.  Cardiovascular: Normal rate, regular rhythm and normal heart sounds.   Pulmonary/Chest: Effort normal and breath sounds normal. No respiratory distress.  Abdominal: Soft. Bowel sounds are normal. He exhibits no distension. There is tenderness. There is no rebound and no guarding.  RLQ/SUPRAPUBIC, BROWN STOOL IN BAG  Musculoskeletal: He exhibits no edema.  Lymphadenopathy:    He has no cervical adenopathy.  Neurological: He is alert and oriented to person, place, and time.  NO  NEW FOCAL DEFICITS   Psychiatric: He has a normal mood and affect.          Assessment & Plan:

## 2014-05-29 NOTE — Progress Notes (Signed)
Reminder in epic °

## 2014-06-05 ENCOUNTER — Other Ambulatory Visit: Payer: Self-pay | Admitting: Gastroenterology

## 2014-06-06 ENCOUNTER — Telehealth: Payer: Self-pay | Admitting: Gastroenterology

## 2014-06-06 NOTE — Telephone Encounter (Signed)
Pt called back and said he got the appt in the mail today for [facility] and it is for 07/16/2014.

## 2014-06-06 NOTE — Telephone Encounter (Signed)
REVIEWED-NO ADDITIONAL RECOMMENDATIONS. 

## 2014-06-06 NOTE — Telephone Encounter (Signed)
Pt called to give [place] his new phone number [phone]) and then asked if we had heard anylthing about his referral to [facility]. He said we were suppose to do the referral and he hasn't heard from anyone. Please advise and call him back.

## 2014-07-30 ENCOUNTER — Ambulatory Visit (INDEPENDENT_AMBULATORY_CARE_PROVIDER_SITE_OTHER): Payer: Medicare Other | Admitting: Urology

## 2014-07-30 DIAGNOSIS — N359 Urethral stricture, unspecified: Secondary | ICD-10-CM | POA: Diagnosis not present

## 2014-07-30 DIAGNOSIS — N390 Urinary tract infection, site not specified: Secondary | ICD-10-CM | POA: Diagnosis not present

## 2014-07-30 DIAGNOSIS — Q5310 Unspecified undescended testicle, unilateral: Secondary | ICD-10-CM

## 2014-08-13 ENCOUNTER — Encounter: Payer: Self-pay | Admitting: Gastroenterology

## 2014-08-20 ENCOUNTER — Other Ambulatory Visit: Payer: Self-pay | Admitting: *Deleted

## 2014-08-20 ENCOUNTER — Encounter: Payer: Self-pay | Admitting: *Deleted

## 2014-08-20 ENCOUNTER — Other Ambulatory Visit: Payer: Self-pay | Admitting: Gastroenterology

## 2014-08-20 DIAGNOSIS — K50019 Crohn's disease of small intestine with unspecified complications: Secondary | ICD-10-CM

## 2014-08-27 ENCOUNTER — Ambulatory Visit: Payer: Medicaid Other | Admitting: Urology

## 2014-09-13 ENCOUNTER — Telehealth (HOSPITAL_COMMUNITY): Payer: Self-pay | Admitting: Hematology & Oncology

## 2014-09-13 NOTE — Telephone Encounter (Signed)
GAVE APPT TIME/DT TO PTS CAREGIVER 10/11/14 @ 2:30

## 2014-10-08 ENCOUNTER — Ambulatory Visit (INDEPENDENT_AMBULATORY_CARE_PROVIDER_SITE_OTHER): Payer: Medicaid Other | Admitting: Urology

## 2014-10-08 DIAGNOSIS — N359 Urethral stricture, unspecified: Secondary | ICD-10-CM

## 2014-10-08 DIAGNOSIS — Q5300 Ectopic testis, unspecified: Secondary | ICD-10-CM

## 2014-10-08 DIAGNOSIS — N390 Urinary tract infection, site not specified: Secondary | ICD-10-CM | POA: Diagnosis not present

## 2014-10-09 ENCOUNTER — Telehealth: Payer: Self-pay

## 2014-10-09 NOTE — Telephone Encounter (Signed)
Pt called and said he had misplaced his lab orders that were due in Dec and he will go do them next week when he has a ride and he will see [place] at [place] on 10/23/2014.

## 2014-10-10 NOTE — Telephone Encounter (Signed)
REVIEWED-NO ADDITIONAL RECOMMENDATIONS. 

## 2014-10-11 ENCOUNTER — Ambulatory Visit (HOSPITAL_COMMUNITY): Payer: Medicaid Other | Admitting: Hematology & Oncology

## 2014-10-11 ENCOUNTER — Encounter (HOSPITAL_COMMUNITY): Payer: Self-pay | Admitting: Oncology

## 2014-10-11 ENCOUNTER — Encounter (HOSPITAL_COMMUNITY): Payer: Medicaid Other | Admitting: Hematology & Oncology

## 2014-10-11 NOTE — Progress Notes (Deleted)
[facility] CONSULT NOTE  Patient Care Team: Elin Claggett, PA-C as PCP - General (Family Medicine) Danie Binder, MD (Gastroenterology)  CHIEF COMPLAINTS/PURPOSE OF CONSULTATION:  M-spike  HISTORY OF PRESENTING ILLNESS:  [name] 49 y.o. male is here because of an abnormal SPEP/IEP.  He has CKD with secondary hyperparathyroidism.  MEDICAL HISTORY:  Past Medical History  Diagnosis Date  . Crohn's colitis     s/p total colectomy with ileostomy in 2009  . Nystagmus   . Bipolar disorder   . Asthma   . GERD (gastroesophageal reflux disease)   . Hyperlipidemia   . Insomnia   . Enterococcus UTI 2009  . 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 DVT (deep venous thrombosis) 2009    right upper extremity due to PICC  . S/P endoscopy Sept 2012    mild gastritis, small hiatal hernia, no ulcers  . Crohn's disease of small intestine Sept 2012    ileoscopy: multiple ulcers likely secondary to Crohn's  . Schizophrenia, acute   . Migraine headache   . Hernia   . Colostomy in place   . Depression   . Schizophrenia   . Arthritis     SURGICAL HISTORY: Past Surgical History  Procedure Laterality Date  . Total colectomy  2009    with ileostomy at [facility] for refractory disease   . Total shoulder replacement      bilateral  . Total hip arthroplasty      right, due to avascular necrosis from chronic prednisone  . Kidney surgery    . Appendectomy    . Eye surgery    . Esophagogastroduodenoscopy  07/2010    gastritis,  bx neg for H.Pylori  . Small bowel capsule study  08/2010    few tiny nonbleeding erosions/ulcers ?secondary to relafen or Crohn's. Entire small bowel not seen.   . Ileoscopy  06/29/2011    SLF: ulcers,multiple/small HH/mild gastritis  . Laparotomy N/A 06/05/2013    Procedure: EXPLORATORY LAPAROTOMY;  Surgeon: Donato Heinz, MD;  Location: [facility];  Service: General;  Laterality: N/A;  . Lysis of adhesion  N/A 06/05/2013    Procedure: LYSIS OF ADHESIONS;  Surgeon: Donato Heinz, MD;  Location: [facility];  Service: General;  Laterality: N/A;  . Esophagogastroduodenoscopy N/A 01/08/2014    SLF:NO SOURCE FOR ODYNOPHAGIA IDENTIFIED/Multiple small ulcers in the gastric antrum    SOCIAL HISTORY: History   Social History  . Marital Status: Single    Spouse Name: N/A    Number of Children: N/A  . Years of Education: N/A   Occupational History  . Not on file.   Social History Main Topics  . Smoking status: Current Every Day Smoker -- 1.00 packs/day    Types: Cigarettes  . Smokeless tobacco: Not on file     Comment: one pack daily  . Alcohol Use: No  . Drug Use: No  . Sexual Activity: No   Other Topics Concern  . Not on file   Social History Narrative   Girlfriend of six years, Rectal Cancer on [facility] (05/2011).    FAMILY HISTORY: No family history on file. has no family status information on file.   ALLERGIES:  is allergic to aspirin; bactrim; codeine; ibuprofen; penicillins; remicade; tramadol hcl; and vicodin.  MEDICATIONS:  Current Outpatient Prescriptions  Medication Sig Dispense Refill  . acetaminophen (TYLENOL) 500 MG tablet Take 1,000 mg by mouth every 6 (  six) hours as needed. For pain     . albuterol (PROAIR HFA) 108 (90 BASE) MCG/ACT inhaler Inhale 2 puffs into the lungs every 6 (six) hours as needed for wheezing or shortness of breath.    Marland Kitchen albuterol (PROVENTIL) (2.5 MG/3ML) 0.083% nebulizer solution Take 2.5 mg by nebulization every 6 (six) hours as needed for wheezing or shortness of breath.    . benztropine (COGENTIN) 0.5 MG tablet Take 0.5 mg by mouth daily.     . Calcium Carbonate-Vitamin D (CALCIUM 600 + D PO) Take 2 tablets by mouth 2 (two) times daily.     . divalproex (DEPAKOTE ER) 250 MG 24 hr tablet Take 250 mg by mouth at bedtime.     . divalproex (DEPAKOTE) 500 MG 24 hr tablet Take 1,000 mg by mouth every evening.     . folic acid (FOLVITE) 1 MG tablet Take 1  mg by mouth daily.      . furosemide (LASIX) 40 MG tablet Take 40 mg by mouth daily.      Marland Kitchen gabapentin (NEURONTIN) 300 MG capsule Take 900 mg by mouth at bedtime.     . mercaptopurine (PURINETHOL) 50 MG tablet 2 PO DAILY.TAKE on an empty stomach 1 hour before or 2 hours after meals. 60 tablet 5  . pantoprazole (PROTONIX) 40 MG tablet TAKE (1) TABLET BY MOUTH DAILY BEFORE BREAKFAST. 30 tablet 5  . potassium chloride SA (K-DUR,KLOR-CON) 20 MEQ tablet Take 20 mEq by mouth daily.     . pravastatin (PRAVACHOL) 20 MG tablet Take 20 mg by mouth at bedtime.     Marland Kitchen rOPINIRole (REQUIP) 2 MG tablet Take 2 mg by mouth at bedtime.    . sertraline (ZOLOFT) 50 MG tablet Take 50 mg by mouth daily.    Marland Kitchen sulfamethoxazole-trimethoprim (BACTRIM DS) 800-160 MG per tablet Take 1 tablet by mouth 2 (two) times daily.    . Tamsulosin HCl (FLOMAX) 0.4 MG CAPS Take 0.4 mg by mouth daily.      . traZODone (DESYREL) 100 MG tablet Take 100 mg by mouth at bedtime.     No current facility-administered medications for this visit.    ROS  PHYSICAL EXAMINATION: ECOG PERFORMANCE STATUS: {CHL ONC ECOG PS:[phone]}  There were no vitals filed for this visit. There were no vitals filed for this visit.   Physical Exam   LABORATORY DATA:  I have reviewed the data as listed Lab Results  Component Value Date   WBC 5.6 03/05/2014   HGB 15.7 03/05/2014   HCT 46.3 03/05/2014   MCV 101.5* 03/05/2014   PLT 198 03/05/2014     Chemistry      Component Value Date/Time   NA 141 03/05/2014 1609   K 4.3 03/05/2014 1609   CL 96 03/05/2014 1609   CO2 28 03/05/2014 1609   BUN 9 03/05/2014 1609   CREATININE 1.03 03/05/2014 1609   CREATININE 0.78 01/08/2014 1301      Component Value Date/Time   CALCIUM 9.9 03/05/2014 1609   ALKPHOS 52 05/23/2014 1115   ALKPHOS 91 11/01/2011 1131   AST 29 05/23/2014 1115   AST 29 11/01/2011 1131   ALT 30 05/23/2014 1115   BILITOT 1.2 05/23/2014 1115   BILITOT 0.4 11/01/2011 1131        RADIOGRAPHIC STUDIES: I have personally reviewed the radiological images as listed and agreed with the findings in the report. No results found.  ASSESSMENT & PLAN:  No problem-specific assessment & plan notes found for  this encounter.  No orders of the defined types were placed in this encounter.    All questions were answered. The patient knows to call the clinic with any problems, questions or concerns. I spent {CHL ONC TIME VISIT - OHKGO:[id number]} counseling the patient face to face. The total time spent in the appointment was {CHL ONC TIME VISIT - ELYHT:[id number]} and more than 50% was on counseling.     Molli Hazard, MD MD 10/11/2014 12:56 PM

## 2014-10-14 NOTE — Progress Notes (Signed)
This encounter was created in error - please disregard.

## 2014-10-16 LAB — CBC WITH DIFFERENTIAL/PLATELET
BASOS ABS: 0 10*3/uL (ref 0.0–0.1)
BASOS PCT: 0 % (ref 0–1)
Eosinophils Absolute: 0 10*3/uL (ref 0.0–0.7)
Eosinophils Relative: 0 % (ref 0–5)
HCT: 43.1 % (ref 39.0–52.0)
Hemoglobin: 15 g/dL (ref 13.0–17.0)
Lymphocytes Relative: 12 % (ref 12–46)
Lymphs Abs: 1.4 10*3/uL (ref 0.7–4.0)
MCH: 33.5 pg (ref 26.0–34.0)
MCHC: 34.8 g/dL (ref 30.0–36.0)
MCV: 96.2 fL (ref 78.0–100.0)
MONO ABS: 1.8 10*3/uL — AB (ref 0.1–1.0)
MPV: 10.8 fL (ref 9.4–12.4)
Monocytes Relative: 16 % — ABNORMAL HIGH (ref 3–12)
NEUTROS ABS: 8.2 10*3/uL — AB (ref 1.7–7.7)
NEUTROS PCT: 72 % (ref 43–77)
PLATELETS: 206 10*3/uL (ref 150–400)
RBC: 4.48 MIL/uL (ref 4.22–5.81)
RDW: 14 % (ref 11.5–15.5)
WBC: 11.4 10*3/uL — AB (ref 4.0–10.5)

## 2014-10-16 LAB — HEPATIC FUNCTION PANEL
ALT: 14 U/L (ref 0–53)
AST: 23 U/L (ref 0–37)
Albumin: 3.6 g/dL (ref 3.5–5.2)
Alkaline Phosphatase: 51 U/L (ref 39–117)
BILIRUBIN DIRECT: 0.1 mg/dL (ref 0.0–0.3)
BILIRUBIN INDIRECT: 1.2 mg/dL (ref 0.2–1.2)
BILIRUBIN TOTAL: 1.3 mg/dL — AB (ref 0.2–1.2)
TOTAL PROTEIN: 7.1 g/dL (ref 6.0–8.3)

## 2014-10-17 ENCOUNTER — Encounter (HOSPITAL_COMMUNITY): Payer: Self-pay

## 2014-10-17 ENCOUNTER — Emergency Department (HOSPITAL_COMMUNITY): Payer: Medicaid Other

## 2014-10-17 ENCOUNTER — Emergency Department (HOSPITAL_COMMUNITY)
Admission: EM | Admit: 2014-10-17 | Discharge: 2014-10-17 | Disposition: A | Discharge status: Home / Self Care | Payer: Medicaid Other | Attending: Emergency Medicine | Admitting: Emergency Medicine

## 2014-10-17 DIAGNOSIS — F319 Bipolar disorder, unspecified: Secondary | ICD-10-CM | POA: Diagnosis not present

## 2014-10-17 DIAGNOSIS — E785 Hyperlipidemia, unspecified: Secondary | ICD-10-CM | POA: Insufficient documentation

## 2014-10-17 DIAGNOSIS — Z720 Tobacco use: Secondary | ICD-10-CM | POA: Diagnosis not present

## 2014-10-17 DIAGNOSIS — K219 Gastro-esophageal reflux disease without esophagitis: Secondary | ICD-10-CM | POA: Diagnosis not present

## 2014-10-17 DIAGNOSIS — J45909 Unspecified asthma, uncomplicated: Secondary | ICD-10-CM | POA: Diagnosis not present

## 2014-10-17 DIAGNOSIS — Z9889 Other specified postprocedural states: Secondary | ICD-10-CM | POA: Insufficient documentation

## 2014-10-17 DIAGNOSIS — R112 Nausea with vomiting, unspecified: Secondary | ICD-10-CM | POA: Insufficient documentation

## 2014-10-17 DIAGNOSIS — Z880 Allergy status to penicillin: Secondary | ICD-10-CM | POA: Diagnosis not present

## 2014-10-17 DIAGNOSIS — M1990 Unspecified osteoarthritis, unspecified site: Secondary | ICD-10-CM | POA: Diagnosis not present

## 2014-10-17 DIAGNOSIS — Z79899 Other long term (current) drug therapy: Secondary | ICD-10-CM | POA: Diagnosis not present

## 2014-10-17 DIAGNOSIS — G43909 Migraine, unspecified, not intractable, without status migrainosus: Secondary | ICD-10-CM | POA: Diagnosis not present

## 2014-10-17 DIAGNOSIS — K5010 Crohn's disease of large intestine without complications: Secondary | ICD-10-CM | POA: Diagnosis not present

## 2014-10-17 DIAGNOSIS — N390 Urinary tract infection, site not specified: Secondary | ICD-10-CM | POA: Diagnosis not present

## 2014-10-17 DIAGNOSIS — Z8619 Personal history of other infectious and parasitic diseases: Secondary | ICD-10-CM | POA: Diagnosis not present

## 2014-10-17 DIAGNOSIS — Z86718 Personal history of other venous thrombosis and embolism: Secondary | ICD-10-CM | POA: Diagnosis not present

## 2014-10-17 LAB — CBC WITH DIFFERENTIAL/PLATELET
Basophils Absolute: 0 10*3/uL (ref 0.0–0.1)
Basophils Relative: 0 % (ref 0–1)
EOS PCT: 0 % (ref 0–5)
Eosinophils Absolute: 0 10*3/uL (ref 0.0–0.7)
HCT: 39.5 % (ref 39.0–52.0)
HEMOGLOBIN: 13.2 g/dL (ref 13.0–17.0)
Lymphocytes Relative: 10 % — ABNORMAL LOW (ref 12–46)
Lymphs Abs: 1.2 10*3/uL (ref 0.7–4.0)
MCH: 33.2 pg (ref 26.0–34.0)
MCHC: 33.4 g/dL (ref 30.0–36.0)
MCV: 99.2 fL (ref 78.0–100.0)
MONOS PCT: 19 % — AB (ref 3–12)
Monocytes Absolute: 2.3 10*3/uL — ABNORMAL HIGH (ref 0.1–1.0)
Neutro Abs: 8.6 10*3/uL — ABNORMAL HIGH (ref 1.7–7.7)
Neutrophils Relative %: 71 % (ref 43–77)
Platelets: 173 10*3/uL (ref 150–400)
RBC: 3.98 MIL/uL — ABNORMAL LOW (ref 4.22–5.81)
RDW: 13.5 % (ref 11.5–15.5)
WBC: 12 10*3/uL — ABNORMAL HIGH (ref 4.0–10.5)

## 2014-10-17 LAB — COMPREHENSIVE METABOLIC PANEL
ALT: 19 U/L (ref 0–53)
ANION GAP: 11 (ref 5–15)
AST: 34 U/L (ref 0–37)
Albumin: 3.3 g/dL — ABNORMAL LOW (ref 3.5–5.2)
Alkaline Phosphatase: 45 U/L (ref 39–117)
BILIRUBIN TOTAL: 1.7 mg/dL — AB (ref 0.3–1.2)
BUN: 17 mg/dL (ref 6–23)
CALCIUM: 9.6 mg/dL (ref 8.4–10.5)
CHLORIDE: 96 meq/L (ref 96–112)
CO2: 26 mmol/L (ref 19–32)
CREATININE: 1.16 mg/dL (ref 0.50–1.35)
GFR calc non Af Amer: 72 mL/min — ABNORMAL LOW (ref >90)
GFR, EST AFRICAN AMERICAN: 84 mL/min — AB (ref >90)
GLUCOSE: 152 mg/dL — AB (ref 70–99)
Potassium: 4.5 mmol/L (ref 3.5–5.1)
Sodium: 133 mmol/L — ABNORMAL LOW (ref 135–145)
TOTAL PROTEIN: 7.1 g/dL (ref 6.0–8.3)

## 2014-10-17 LAB — URINE MICROSCOPIC-ADD ON

## 2014-10-17 LAB — URINALYSIS, ROUTINE W REFLEX MICROSCOPIC
BILIRUBIN URINE: NEGATIVE
GLUCOSE, UA: NEGATIVE mg/dL
KETONES UR: 15 mg/dL — AB
NITRITE: POSITIVE — AB
Protein, ur: 30 mg/dL — AB
Specific Gravity, Urine: 1.025 (ref 1.005–1.030)
Urobilinogen, UA: 0.2 mg/dL (ref 0.0–1.0)
pH: 5.5 (ref 5.0–8.0)

## 2014-10-17 LAB — LIPASE, BLOOD: Lipase: 17 U/L (ref 11–59)

## 2014-10-17 MED ORDER — ONDANSETRON 4 MG PO TBDP: 4.0000 mg | ORAL_TABLET | Freq: Three times a day (TID) | ORAL | Status: DC | PRN

## 2014-10-17 MED ORDER — ONDANSETRON 8 MG PO TBDP
8.0000 mg | ORAL_TABLET | Freq: Once | ORAL | Status: AC
  Administered 2014-10-17: 8 mg via ORAL
  Filled 2014-10-17: qty 1

## 2014-10-17 MED ORDER — NITROFURANTOIN MONOHYD MACRO 100 MG PO CAPS: 100.0000 mg | ORAL_CAPSULE | Freq: Two times a day (BID) | ORAL | Status: DC

## 2014-10-17 MED ORDER — CEFTRIAXONE SODIUM 1 G IJ SOLR
1.0000 g | Freq: Once | INTRAMUSCULAR | Status: AC
  Administered 2014-10-17: 1 g via INTRAMUSCULAR
  Filled 2014-10-17: qty 10

## 2014-10-17 MED ORDER — LIDOCAINE HCL (PF) 1 % IJ SOLN
INTRAMUSCULAR | Status: AC
  Administered 2014-10-17: 21:00:00
  Filled 2014-10-17: qty 5

## 2014-10-17 NOTE — ED Notes (Signed)
Pt tolerated gingerale well

## 2014-10-17 NOTE — ED Notes (Signed)
Pt walked to waiting room to call a ride home. I tried but no one answered.

## 2014-10-17 NOTE — ED Notes (Signed)
Pt unable to void

## 2014-10-17 NOTE — Discharge Instructions (Signed)
Emergency Department Resource Guide 1) Find a Doctor and Pay Out of Pocket Although you won't have to find out who is covered by your insurance plan, it is a good idea to ask around and get recommendations. You will then need to call the office and see if the doctor you have chosen will accept you as a new patient and what types of options they offer for patients who are self-pay. Some doctors offer discounts or will set up payment plans for their patients who do not have insurance, but you will need to ask so you aren't surprised when you get to your appointment.  2) Contact Your Local Health Department Not all health departments have doctors that can see patients for sick visits, but many do, so it is worth a call to see if yours does. If you don't know where your local health department is, you can check in your phone book. The CDC also has a tool to help you locate your state's health department, and many state websites also have listings of all of their local health departments.  3) Find a [facility] If your illness is not likely to be very severe or complicated, you may want to try a walk in clinic. These are popping up all over the country in pharmacies, drugstores, and shopping centers. They're usually staffed by nurse practitioners or physician assistants that have been trained to treat common illnesses and complaints. They're usually fairly quick and inexpensive. However, if you have serious medical issues or chronic medical problems, these are probably not your best option.  No Primary Care Doctor: - Call Health Connect at  [phone] - they can help you locate a primary care doctor that  accepts your insurance, provides certain services, etc. - Physician Referral Service- [phone]  Chronic Pain Problems: Organization         Address  Phone   Notes  [facility]  [phone] Patients need to be referred by their primary care doctor.   Medication  Assistance: Organization         Address  Phone   Notes  [facility] Medication [facility] [address] [place], [place] [address] [phone] --Must be a resident of [facility] -- Must have NO insurance coverage whatsoever (no Medicaid/ Medicare, etc.) -- The pt. MUST have a primary care doctor that directs their care regularly and follows them in the community   [facility]  [phone]   [employer]  [phone]    Agencies that provide inexpensive medical care: Organization         Address  Phone   Notes  [place]  [phone]   [facility]    [phone]   [facility] [place], [place] [address] [phone]   [place] [address], [place] [phone]   Planned Parenthood    [phone]   [facility]    [phone]   [place] and [address], [place] Phone:  [phone], Fax:  [phone] Hours of Operation:  9 am - 6 pm, M-F.  Also accepts Medicaid/Medicare and self-pay.  [facility] for [place] [address], [address], [place] Phone: [phone], Fax: [phone]. Hours of Operation:  8:30 am - 5:30 pm, M-F.  Also accepts Medicaid and self-pay.  [facility] [address], [place] Phone: [phone]   [place], [place], [place] [phone], Ext. [phone] Mondays & Thursdays: 7-9 AM.  First 15 patients are seen on a first come, first serve basis.    [place] Providers:  Organization         Address  Phone   Notes  [facility] [address], [place] [phone] Also accepts self-pay patients.  [facility] [address], [place]  [phone]   [place], [address], [place]  [phone]   [facility] [address], [place] [phone]   Lucianne Lei [address], [place]   [phone] Only accepts [place] Access [place] patients after they have their name applied to their card.   Self-Pay (no insurance) in [facility]:  Organization         Address  Phone   Notes  Sickle Cell Patients, [facility] [place] [phone]   [facility] [place] [phone]   [facility] [place]  [place], [place], [place] [phone]   [facility]/Dr. Osei-Bonsu  [address], [place] [address], [place] [phone] Phone number for both [place] and [place] locations is the same.  Urgent Medical and [facility] [address], [place] [phone]   [facility] [address] [place] or [address] [phone]   [facility] [address] [place] [phone], phone; [phone], fax Sees patients 1st and 3rd Saturday of every month.  Must not qualify for public or private insurance (i.e. Medicaid, Medicare, [place] Health Choice, Veterans' Benefits)  Household income should be no more than 200% of the poverty level The clinic cannot treat you if you are pregnant or think you are pregnant  Sexually transmitted diseases are not treated at the clinic.    Dental Care: Organization         Address  Phone  Notes  [facility] Department of [facility] [place] [phone] Accepts children up to age 76 who are enrolled in [place] or [place]; pregnant women with a Medicaid card; and children who have applied for Medicaid or [place] Health Choice, but were declined, whose parents can pay a reduced fee at time of service.  [facility]  Department of [facility]  [address], [place] [phone] Accepts children up to age 25 who are enrolled in [place] or [place]; pregnant women with a Medicaid card; and children who have applied for Medicaid or  Health Choice, but were declined, whose parents can pay a reduced fee at time of service.  [facility]  [place] [phone] Patients are seen by appointment only. Walk-ins are not accepted. [place] will see patients 29 years of age and older. Monday - Tuesday (8am-5pm) Most Wednesdays (8:30-5pm) $30 per visit, cash only  [facility] Adult Dental Access PROGRAM  [address], [facility] [phone] Patients are seen by appointment only. Walk-ins are not accepted. [place] will see patients 3 years of age and older. One  Wednesday Evening (Monthly: Volunteer Based).  $30 per visit, cash only  [place]  [phone] for adults; Children under age 31, call Graduate Pediatric Dentistry at [phone]. Children aged 21-14, please call [phone] to request a pediatric application.  Dental services are provided in all areas of dental care including fillings, crowns and bridges, complete and partial dentures, implants, gum treatment, root canals, and extractions. Preventive care is also provided. Treatment is provided to both adults and children. Patients are selected via a lottery and there is often a waiting list.   [facility] [address], [place]  [phone] [email or web address]   [facility] [address] [place], [place] [phone], Ext. [phone] Second and Fourth Thursday of each month, opens at 6:30 AM; Clinic ends at 9 AM.  Patients are seen on a first-come first-served basis, and a limited number are seen during each clinic.   [facility]  [address] Hillard Danker [place], [place] [phone]    Eligibility Requirements You must have lived in [place], [place], or [place] counties for at least the last three months.   You cannot be eligible for state or federal sponsored [employer], including [employer], [place], or [employer].   You generally cannot be eligible for healthcare insurance through your employer.    How to apply: Eligibility screenings are held every Tuesday and Wednesday afternoon from 1:00 pm until 4:00 pm. You do not need an appointment for the interview!  [facility] [address] [place], [place]   [place]  [place] Department  [place]  [phone]    Behavioral Health Resources in the Community: Intensive Outpatient Programs Organization         Address  Phone  Notes  [place]. [address] [place], [place] [phone]   [facility] [address] [place], [place]   ADS: [facility] [address], [place], [place]   [place] [address]  [place], [place] or [phone]   Substance Abuse Resources Organization         Address  Phone  Notes  Alcohol and Drug Services  [phone]   [place]  [phone]   [facility] [place]   Chinita Pester  [phone]   Residential & Outpatient Substance Abuse Program  [phone]   Psychological Services Organization         Address  Phone  Notes  [facility] [place]  [place]  [phone]   [place] [address] [place] or [phone]    Mobile Crisis Teams Organization         Address  Phone  Notes  [facility], Mobile Crisis Care Unit  [phone]   [facility]  [address]  [place], [place]   [place] [address], [place] [phone]    Self-Help/Support Groups Organization         Address  Phone             Notes  [place] of [place] - variety of support groups  [place]ode Call for more information  Narcotics Anonymous (NA), [facility] [address], [employer] [place]  2 meetings at this location  Residential Treatment Programs Organization         Address  Phone  Notes  [facility] [address],    [place]  [phone]   [facility]  [address] [place] [address], [place], [place]   [place], [place] [phone] Admissions: 8am-3pm M-F  Incentives Substance [place] [address]    [place], [place] [phone]   [facility] [address] [place], [place], [place]   [facility] [address],  [place], [place]   [facility] - Intensive Outpatient [address], [place], [place]   [facility] ([place] [place].,  [place], [place] [phone] or [phone]   [facility]idential Treatment Services (RTS) [address] [place], [place] Accepts Medicaid  [facility] [place] [address]  [place] [phone] Substance Abuse/Addiction Treatment   [facility] Organization         Address  Phone  Notes  [facility]  [phone]   Domenic Schwab, PhD [address] [place]lis [place], [place]   [phone] or [phone]   [place], [place] [phone]   [facility] [address] [place], [place] [phone] Insurance/Medicaid/sponsorship through [facility] and Families [address]                                    [place], [place] [phone] [place] [address] [place] [address], [place] [phone]    Dr. Adele Schilder  [phone]   [facility] Dept. 1) [address], [place] 2) [place] 3)  [place] [address], [place] [phone]  [phone]   [place] [phone] or [phone] (After Hours)      Take the prescriptions as directed.  Increase your fluid intake (ie:  Gatoraide) for the next few days.  Eat a bland diet and advance to your regular diet slowly as you can tolerate it.  Call your regular medical doctor tomorrow to schedule a follow up appointment in the next 2 days.  Return to the Emergency Department immediately if not improving (or even worsening) despite taking the medicines as prescribed, any black or bloody stool or vomit, if you develop a fever over "101," or for any other concerns.

## 2014-10-17 NOTE — ED Notes (Signed)
AC called to bring new colostomy bag.

## 2014-10-17 NOTE — ED Provider Notes (Signed)
CSN: [id number]     Arrival date & time 10/17/14  1647 History   First MD Initiated Contact with Patient 10/17/14 1700     Chief Complaint  Patient presents with  . Emesis      HPI Pt was seen at 1715. Per pt, c/o gradual onset and persistence of multiple intermittent episodes of N/V that began 2 to 3 days ago. States he has vomited "maybe 2 to 3 times" over the past 2 to 3 days. EMS states on their arrival to scene, pt told them his "colostomy bag burst" and "his friends wouldn't change it." Denies abd pain, no CP/SOB, no back pain, no fevers, no black or blood in stools or emesis.     Past Medical History  Diagnosis Date  . Crohn's colitis     s/p total colectomy with ileostomy in 2009  . Nystagmus   . Bipolar disorder   . Asthma   . GERD (gastroesophageal reflux disease)   . Hyperlipidemia   . Insomnia   . Enterococcus UTI 2009  . 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 DVT (deep venous thrombosis) 2009    right upper extremity due to PICC  . S/P endoscopy Sept 2012    mild gastritis, small hiatal hernia, no ulcers  . Crohn's disease of small intestine Sept 2012    ileoscopy: multiple ulcers likely secondary to Crohn's  . Schizophrenia, acute   . Migraine headache   . Hernia   . Colostomy in place   . Depression   . Schizophrenia   . Arthritis    Past Surgical History  Procedure Laterality Date  . Total colectomy  2009    [facility] disease   . Total shoulder replacement      bilateral  . Total hip arthroplasty      right, due to avascular necrosis from chronic prednisone  . Kidney surgery    . Appendectomy    . Eye surgery    . Esophagogastroduodenoscopy  07/2010    gastritis,  bx neg for H.Pylori  . Small bowel capsule study  08/2010    few tiny nonbleeding erosions/ulcers ?secondary to relafen or Crohn's. Entire small bowel not seen.   . Ileoscopy  06/29/2011    SLF:  ulcers,multiple/small HH/mild gastritis  . Laparotomy N/A 06/05/2013    Procedure: EXPLORATORY LAPAROTOMY;  Surgeon: Donato Heinz, MD;  Location: [facility];  Service: General;  Laterality: N/A;  . Lysis of adhesion N/A 06/05/2013    Procedure: LYSIS OF ADHESIONS;  Surgeon: Donato Heinz, MD;  Location: [facility];  Service: General;  Laterality: N/A;  . Esophagogastroduodenoscopy N/A 01/08/2014    SLF:NO SOURCE FOR ODYNOPHAGIA IDENTIFIED/Multiple small ulcers in the gastric antrum    History  Substance Use Topics  . Smoking status: Current Every Day Smoker -- 1.00 packs/day    Types: Cigarettes  . Smokeless tobacco: Not on file     Comment: one pack daily  . Alcohol Use: No    Review of Systems ROS: Statement: All systems negative except as marked or noted in the HPI; Constitutional: Negative for fever and chills. ; ; Eyes: Negative for eye pain, redness and discharge. ; ; ENMT: Negative for ear pain, hoarseness, nasal congestion, sinus pressure and sore throat. ; ; Cardiovascular: Negative for chest pain, palpitations, diaphoresis, dyspnea and peripheral edema. ; ; Respiratory: Negative for cough, wheezing and stridor. ; ;  Gastrointestinal: +N/V. Negative for diarrhea, abdominal pain, blood in stool, hematemesis, jaundice and rectal bleeding. . ; ; Genitourinary: Negative for dysuria, flank pain and hematuria. ; ; Musculoskeletal: Negative for back pain and neck pain. Negative for swelling and trauma.; ; Skin: Negative for pruritus, rash, abrasions, blisters, bruising and skin lesion.; ; Neuro: Negative for headache, lightheadedness and neck stiffness. Negative for weakness, altered level of consciousness , altered mental status, extremity weakness, paresthesias, involuntary movement, seizure and syncope.        Allergies  Aspirin; Bactrim; Codeine; Ibuprofen; Penicillins; Remicade; Tramadol hcl; and Vicodin  Home Medications   Prior to Admission medications   Medication Sig Start Date End  Date Taking? Authorizing Provider  benztropine (COGENTIN) 0.5 MG tablet Take 0.5 mg by mouth daily.    Yes Historical Provider, MD  Calcium Carbonate-Vitamin D (CALCIUM 600 + D PO) Take 1 tablet by mouth 2 (two) times daily.    Yes Historical Provider, MD  divalproex (DEPAKOTE ER) 250 MG 24 hr tablet Take 250 mg by mouth at bedtime.    Yes Historical Provider, MD  divalproex (DEPAKOTE) 500 MG 24 hr tablet Take 1,000 mg by mouth every evening.    Yes Historical Provider, MD  folic acid (FOLVITE) 1 MG tablet Take 1 mg by mouth daily.     Yes Historical Provider, MD  furosemide (LASIX) 40 MG tablet Take 40 mg by mouth daily.     Yes Historical Provider, MD  gabapentin (NEURONTIN) 300 MG capsule Take 900 mg by mouth 3 (three) times daily.    Yes Historical Provider, MD  mercaptopurine (PURINETHOL) 50 MG tablet 2 PO DAILY.TAKE on an empty stomach 1 hour before or 2 hours after meals. Patient taking differently: Take 100 mg by mouth daily. 2 PO DAILY.TAKE on an empty stomach 1 hour before or 2 hours after meals. 05/08/14  Yes Orvil Feil, NP  pantoprazole (PROTONIX) 40 MG tablet TAKE (1) TABLET BY MOUTH DAILY BEFORE BREAKFAST. 06/06/14  Yes Orvil Feil, NP  potassium chloride SA (K-DUR,KLOR-CON) 20 MEQ tablet Take 20 mEq by mouth daily.    Yes Historical Provider, MD  pravastatin (PRAVACHOL) 20 MG tablet Take 40 mg by mouth at bedtime.    Yes Historical Provider, MD  rOPINIRole (REQUIP) 2 MG tablet Take 2 mg by mouth at bedtime.   Yes Historical Provider, MD  sertraline (ZOLOFT) 50 MG tablet Take 50 mg by mouth daily.   Yes Historical Provider, MD  Tamsulosin HCl (FLOMAX) 0.4 MG CAPS Take 0.4 mg by mouth daily.     Yes Historical Provider, MD  traZODone (DESYREL) 100 MG tablet Take 100 mg by mouth at bedtime.   Yes Historical Provider, MD  acetaminophen (TYLENOL) 500 MG tablet Take 1,000 mg by mouth every 6 (six) hours as needed. For pain     Historical Provider, MD  albuterol (PROAIR HFA) 108 (90 BASE)  MCG/ACT inhaler Inhale 2 puffs into the lungs every 6 (six) hours as needed for wheezing or shortness of breath.    Historical Provider, MD  albuterol (PROVENTIL) (2.5 MG/3ML) 0.083% nebulizer solution Take 2.5 mg by nebulization every 6 (six) hours as needed for wheezing or shortness of breath.    Historical Provider, MD   BP 115/68 mmHg  Pulse 92  Temp(Src) 98.6 F (37 C) (Oral)  Resp 20  Ht 5' 6"  (1.676 m)  Wt 232 lb (105.235 kg)  BMI 37.46 kg/m2  SpO2 94% Physical Exam  1720: Physical examination:  Nursing  notes reviewed; Vital signs and O2 SAT reviewed;  Constitutional: Well developed, Well nourished, Well hydrated, In no acute distress; Head:  Normocephalic, atraumatic; Eyes: EOMI, PERRL, No scleral icterus; ENMT: Mouth and pharynx normal, Mucous membranes moist; Neck: Supple, Full range of motion, No lymphadenopathy; Cardiovascular: Regular rate and rhythm, No gallop; Respiratory: Breath sounds clear & equal bilaterally, No wheezes.  Speaking full sentences with ease, Normal respiratory effort/excursion; Chest: Nontender, Movement normal; Abdomen: Soft, +colostomy with liquid brown stool output, no obvious blood in stool. Nontender, Nondistended, Normal bowel sounds; Genitourinary: No CVA tenderness; Extremities: Pulses normal, No tenderness, No edema, No calf edema or asymmetry.; Neuro: AA&Ox3, Major CN grossly intact.  Speech clear. No gross focal motor or sensory deficits in extremities.; Skin: Color normal, Warm, Dry.   ED Course  Procedures     EKG Interpretation None      MDM  MDM Reviewed: previous chart, nursing note and vitals Reviewed previous: labs Interpretation: labs     Results for orders placed or performed during the hospital encounter of 10/17/14  Comprehensive metabolic panel  Result Value Ref Range   Sodium 133 (L) 135 - 145 mmol/L   Potassium 4.5 3.5 - 5.1 mmol/L   Chloride 96 96 - 112 mEq/L   CO2 26 19 - 32 mmol/L   Glucose, Bld 152 (H) 70 - 99  mg/dL   BUN 17 6 - 23 mg/dL   Creatinine, Ser 1.16 0.50 - 1.35 mg/dL   Calcium 9.6 8.4 - 10.5 mg/dL   Total Protein 7.1 6.0 - 8.3 g/dL   Albumin 3.3 (L) 3.5 - 5.2 g/dL   AST 34 0 - 37 U/L   ALT 19 0 - 53 U/L   Alkaline Phosphatase 45 39 - 117 U/L   Total Bilirubin 1.7 (H) 0.3 - 1.2 mg/dL   GFR calc non Af Amer 72 (L) >90 mL/min   GFR calc Af Amer 84 (L) >90 mL/min   Anion gap 11 5 - 15  CBC with Differential  Result Value Ref Range   WBC 12.0 (H) 4.0 - 10.5 K/uL   RBC 3.98 (L) 4.22 - 5.81 MIL/uL   Hemoglobin 13.2 13.0 - 17.0 g/dL   HCT 39.5 39.0 - 52.0 %   MCV 99.2 78.0 - 100.0 fL   MCH 33.2 26.0 - 34.0 pg   MCHC 33.4 30.0 - 36.0 g/dL   RDW 13.5 11.5 - 15.5 %   Platelets 173 150 - 400 K/uL   Neutrophils Relative % 71 43 - 77 %   Neutro Abs 8.6 (H) 1.7 - 7.7 K/uL   Lymphocytes Relative 10 (L) 12 - 46 %   Lymphs Abs 1.2 0.7 - 4.0 K/uL   Monocytes Relative 19 (H) 3 - 12 %   Monocytes Absolute 2.3 (H) 0.1 - 1.0 K/uL   Eosinophils Relative 0 0 - 5 %   Eosinophils Absolute 0.0 0.0 - 0.7 K/uL   Basophils Relative 0 0 - 1 %   Basophils Absolute 0.0 0.0 - 0.1 K/uL  Lipase, blood  Result Value Ref Range   Lipase 17 11 - 59 U/L  Urinalysis, Routine w reflex microscopic  Result Value Ref Range   Color, Urine ORANGE (A) YELLOW   APPearance CLEAR CLEAR   Specific Gravity, Urine 1.025 1.005 - 1.030   pH 5.5 5.0 - 8.0   Glucose, UA NEGATIVE NEGATIVE mg/dL   Hgb urine dipstick SMALL (A) NEGATIVE   Bilirubin Urine NEGATIVE NEGATIVE   Ketones, ur 15 (  A) NEGATIVE mg/dL   Protein, ur 30 (A) NEGATIVE mg/dL   Urobilinogen, UA 0.2 0.0 - 1.0 mg/dL   Nitrite POSITIVE (A) NEGATIVE   Leukocytes, UA SMALL (A) NEGATIVE  Urine microscopic-add on  Result Value Ref Range   Squamous Epithelial / LPF RARE RARE   WBC, UA 21-50 <3 WBC/hpf   RBC / HPF 7-10 <3 RBC/hpf   Bacteria, UA MANY (A) RARE    Dg Abd Acute W/chest 10/17/2014   CLINICAL DATA:  Acute onset of vomiting for 2 days. Mid  abdominal pain. Initial encounter.  EXAM: ACUTE ABDOMEN SERIES (ABDOMEN 2 VIEW & CHEST 1 VIEW)  COMPARISON:  Chest radiograph from 06/04/2013, and CT of the abdomen and pelvis from 03/05/2014  FINDINGS: The lungs are well-aerated and clear. There is no evidence of focal opacification, pleural effusion or pneumothorax. The cardiomediastinal silhouette is within normal limits. A right-sided chest port is noted ending overlying the mid SVC.  The visualized bowel gas pattern is unremarkable. Scattered stool and air are seen within the colon; there is no evidence of small bowel dilatation to suggest obstruction. No free intra-abdominal air is identified on the provided upright view. A clip is noted overlying the right mid abdomen, and another clip is noted overlying the right hemipelvis.  No acute osseous abnormalities are seen; the sacroiliac joints are unremarkable in appearance. The patient has bilateral shoulder hemiarthroplasty is are grossly unremarkable in appearance, though incompletely imaged on this study. The right hip hemiarthroplasty is also grossly unremarkable.  IMPRESSION: 1. Unremarkable bowel gas pattern; no free intra-abdominal air seen. 2. No acute cardiopulmonary process identified.   Electronically Signed   By: Garald Balding M.D.   On: 10/17/2014 21:40    2145:   Pt has tol PO well while in the ED without N/V. Abd remains benign, VSS. Pt states he feels "great" and "100% better" and wants to go home now. Will tx for +UTI with IM rocephin here, rx macrobid. UC is pending. Dx and testing d/w pt.  Questions answered.  Verb understanding, agreeable to d/c home with outpt f/u.      Francine Graven, DO 10/21/14 [phone]

## 2014-10-17 NOTE — ED Notes (Addendum)
Pt states he has been vomiting for a week. Denies other symptoms. Per EMS, when they picked pt up his colostomy bag had burst and " pt's friends " would not given then the supplies to change bag.

## 2014-10-20 LAB — URINE CULTURE: Colony Count: >=100000

## 2014-10-21 ENCOUNTER — Telehealth (HOSPITAL_BASED_OUTPATIENT_CLINIC_OR_DEPARTMENT_OTHER): Payer: Self-pay | Admitting: Emergency Medicine

## 2014-10-21 NOTE — Progress Notes (Signed)
ED Antimicrobial Stewardship Positive Culture Follow Up   [name] is an 48 y.o. male who presented to [facility] on 10/17/2014 with a chief complaint of  Chief Complaint  Patient presents with  . Emesis    Recent Results (from the past 720 hour(s))  Urine culture     Status: None   Collection Time: 10/17/14  7:40 PM  Result Value Ref Range Status   Specimen Description URINE, CLEAN CATCH  Final   Special Requests NONE  Final   Colony Count   Final    >=100,000 COLONIES/ML Performed at [facility]    Culture   Final    KLEBSIELLA PNEUMONIAE Performed at [facility]    Report Status 10/20/2014 FINAL  Final   Organism ID, Bacteria KLEBSIELLA PNEUMONIAE  Final      Susceptibility   Klebsiella pneumoniae - MIC*    AMPICILLIN >=32 RESISTANT Resistant     CEFAZOLIN <=4 SENSITIVE Sensitive     CEFTRIAXONE <=1 SENSITIVE Sensitive     CIPROFLOXACIN <=0.25 SENSITIVE Sensitive     GENTAMICIN <=1 SENSITIVE Sensitive     LEVOFLOXACIN 1 SENSITIVE Sensitive     NITROFURANTOIN 128 RESISTANT Resistant     TOBRAMYCIN <=1 SENSITIVE Sensitive     TRIMETH/SULFA <=20 SENSITIVE Sensitive     PIP/TAZO >=128 RESISTANT Resistant     * KLEBSIELLA PNEUMONIAE     [x]  Treated with Nitrofurantoin, organism resistant to prescribed antimicrobial  New antibiotic prescription: Cephalexin 559m po BID for 7 days  ED Provider: LHarvie Heck PA-C   PCristy Friedlander1/18/2016, 1:08 PM Infectious Diseases Pharmacist Phone# [phone]

## 2014-10-21 NOTE — Telephone Encounter (Signed)
Post ED Visit - Positive Culture Follow-up: Successful Patient Follow-Up  Culture assessed and recommendations reviewed by: []  Wes Donnella Sham, Pharm.D., BCPS []  Heide Guile, Pharm.D., BCPS []  Alycia Rossetti, Pharm.D., BCPS []  [place], [place].D., BCPS, AAHIVP []  Legrand Como, Pharm.D., BCPS, AAHIVP []  Hassie Bruce, Pharm.D. []  Milus Glazier, [place].D. Lorie Pool Pharm D  Positive urine  Culture JKlebsiella  []  Patient discharged without antimicrobial prescription and treatment is now indicated [x]  Organism is resistant to prescribed ED discharge antimicrobial []  Patient with positive blood cultures  Changes discussed with ED provider:Lauren Jerline Pain PA New antibiotic prescription stop nitrofurantoin, start keflex 567m po bid x 7 days Called to [name], date 10/21/14, time [name] 10/21/2014, 2:55 PM

## 2014-10-23 ENCOUNTER — Ambulatory Visit: Payer: Medicaid Other | Admitting: Gastroenterology

## 2014-10-24 ENCOUNTER — Other Ambulatory Visit: Payer: Self-pay | Admitting: Gastroenterology

## 2014-11-06 ENCOUNTER — Telehealth: Payer: Self-pay

## 2014-11-06 NOTE — Telephone Encounter (Signed)
Pt called for lab results done on 10/16/2014.

## 2014-11-07 NOTE — Telephone Encounter (Signed)
LMOM that his blood work was normal.

## 2014-11-07 NOTE — Telephone Encounter (Signed)
PLEASE CALL PT. HIS LIVER ENZYMES AND BLOOD COUNT WERE NORMAL ON JAN 13.

## 2014-11-15 ENCOUNTER — Telehealth: Payer: Self-pay

## 2014-11-15 NOTE — Telephone Encounter (Signed)
Pt called to let Dr. Oneida Alar know that he has been diagnosed as Type Two Diabetic.  Michela Pitcher he is trying to do good on his diet and has lost about 15 pounds since he was here. Has appt on 11/20/2014.

## 2014-11-15 NOTE — Telephone Encounter (Signed)
REVIEWED-NO ADDITIONAL RECOMMENDATIONS. 

## 2014-11-20 ENCOUNTER — Ambulatory Visit (INDEPENDENT_AMBULATORY_CARE_PROVIDER_SITE_OTHER): Payer: Medicaid Other | Admitting: Gastroenterology

## 2014-11-20 ENCOUNTER — Encounter: Payer: Self-pay | Admitting: Gastroenterology

## 2014-11-20 VITALS — BP 120/76 | HR 78 | Temp 97.5°F | Ht 66.0 in | Wt 215.4 lb

## 2014-11-20 DIAGNOSIS — K760 Fatty (change of) liver, not elsewhere classified: Secondary | ICD-10-CM

## 2014-11-20 DIAGNOSIS — K219 Gastro-esophageal reflux disease without esophagitis: Secondary | ICD-10-CM | POA: Diagnosis not present

## 2014-11-20 DIAGNOSIS — K5000 Crohn's disease of small intestine without complications: Secondary | ICD-10-CM | POA: Diagnosis not present

## 2014-11-20 MED ORDER — PANTOPRAZOLE SODIUM 40 MG PO TBEC: DELAYED_RELEASE_TABLET | ORAL | Status: DC

## 2014-11-20 NOTE — Assessment & Plan Note (Addendum)
SX CONTROLLED.  LABS REVIEWED WITH PT. CONTINUE 6-MP. CONTINUE TO MONITOR SYMPTOMS. FOLLOW UP IN 4 MOS.

## 2014-11-20 NOTE — Progress Notes (Signed)
ON RECALL LIST  °

## 2014-11-20 NOTE — Progress Notes (Signed)
Subjective:    Patient ID: [name], male    DOB: 17-Jun-1966, 49 y.o.   MRN: [id number]  CLAGGETT,ELIN, PA-C  HPI JUST DIAGNOSED WITH TYPE II DIABETES. METFORMIN CAUSE DIARRHEA. CHANGING BAG 2-3 TIMES A DAY. LOST 2 LBS SINCE LAST SUMMER. RARE HEARTBURN: 1-2X/WEEK SX LAST FOR 2-3 HOURS.SEEING [facility].   PT DENIES FEVER, CHILLS, HEMATOCHEZIA, HEMATEMESIS, nausea, vomiting, melena, CHEST PAIN, SHORTNESS OF BREATH,  CHANGE IN BOWEL IN HABITS, constipation, abdominal pain, OR problems swallowing.   Past Medical History  Diagnosis Date  . Crohn's colitis     s/p total colectomy with ileostomy in 2009  . Nystagmus   . Bipolar disorder   . Asthma   . GERD (gastroesophageal reflux disease)   . Hyperlipidemia   . Insomnia   . Enterococcus UTI 2009  . 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 DVT (deep venous thrombosis) 2009    right upper extremity due to PICC  . S/P endoscopy Sept 2012    mild gastritis, small hiatal hernia, no ulcers  . Crohn's disease of small intestine Sept 2012    ileoscopy: multiple ulcers likely secondary to Crohn's  . Schizophrenia, acute   . Migraine headache   . Hernia   . Colostomy in place   . Depression   . Schizophrenia   . Arthritis     Past Surgical History  Procedure Laterality Date  . Total colectomy  2009    with ileostomy at [facility] for refractory disease   . Total shoulder replacement      bilateral  . Total hip arthroplasty      right, due to avascular necrosis from chronic prednisone  . Kidney surgery    . Appendectomy    . Eye surgery    . Esophagogastroduodenoscopy  07/2010    gastritis,  bx neg for H.Pylori  . Small bowel capsule study  08/2010    few tiny nonbleeding erosions/ulcers ?secondary to relafen or Crohn's. Entire small bowel not seen.   . Ileoscopy  06/29/2011    SLF: ulcers,multiple/small HH/mild gastritis  . Laparotomy N/A 06/05/2013    Procedure:  EXPLORATORY LAPAROTOMY;  Surgeon: Donato Heinz, MD;  Location: [facility];  Service: General;  Laterality: N/A;  . Lysis of adhesion N/A 06/05/2013    Procedure: LYSIS OF ADHESIONS;  Surgeon: Donato Heinz, MD;  Location: [facility];  Service: General;  Laterality: N/A;  . Esophagogastroduodenoscopy N/A 01/08/2014    SLF:NO SOURCE FOR ODYNOPHAGIA IDENTIFIED/Multiple small ulcers in the gastric antrum   Allergies  Allergen Reactions  . Aspirin     REACTION: unknown reaction  . Bactrim [Sulfamethoxazole-Trimethoprim]     ABD CRAMPS AND DIARRHEA  . Codeine     REACTION: unknown reaction  . Ibuprofen     REACTION: unknown reaction  . Penicillins     REACTION: unknown reaction  . Remicade [Infliximab]     COULDN'T BREATHE  . Tramadol Hcl     REACTION: unknown reaction  . Vicodin [Hydrocodone-Acetaminophen] Nausea Only    Current Outpatient Prescriptions  Medication Sig Dispense Refill  . acetaminophen (TYLENOL) 500 MG tablet Take 1,000 mg by mouth every 6 (six) hours as needed. For pain     . benztropine (COGENTIN) 0.5 MG tablet Take 0.5 mg by mouth daily.     . Calcium Carbonate-Vitamin D (CALCIUM 600 + D PO) Take 1 tablet by mouth 2 (two) times daily.     Marland Kitchen  divalproex (DEPAKOTE ER) 250 MG 24 hr tablet Take 250 mg by mouth at bedtime.     . divalproex (DEPAKOTE) 500 MG 24 hr tablet Take 1,000 mg by mouth every evening.     . folic acid (FOLVITE) 1 MG tablet Take 1 mg by mouth daily.      . furosemide (LASIX) 40 MG tablet Take 40 mg by mouth daily.      Marland Kitchen gabapentin (NEURONTIN) 300 MG capsule Take 900 mg by mouth 3 (three) times daily.     Marland Kitchen glipiZIDE (GLUCOTROL) 5 MG tablet Take by mouth 2 (two) times daily before a meal.    . mercaptopurine (PURINETHOL) 50 MG tablet TAKE 2 TABLETS DAILY ON EMPTY STOMACH 1 HOUR BEFORE OR 2 HOURS AFTER MEAL    . metFORMIN (GLUCOPHAGE) 500 MG tablet Take by mouth 2 (two) times daily with a meal.     Macrobid Take 1 capsule (100 mg total) by mouth 2 (two)  times daily.    . ondansetron (ZOFRAN ODT) 4 MG disintegrating tablet Take 1 tablet (4 mg total) by mouth every 8 (eight) hours as needed for nausea or vomiting.    . pantoprazole (PROTONIX) 40 MG tablet 1 PO 30 MINS PRIOR TO BREAKFAST AND SUPPER.    Marland Kitchen potassium chloride SA (K-DUR,KLOR-CON) 20 MEQ tablet Take 20 mEq by mouth daily.     . pravastatin (PRAVACHOL) 20 MG tablet Take 40 mg by mouth at bedtime.     Marland Kitchen rOPINIRole (REQUIP) 2 MG tablet Take 2 mg by mouth at bedtime.    . sertraline (ZOLOFT) 50 MG tablet Take 50 mg by mouth daily.    . Tamsulosin HCl (FLOMAX) 0.4 MG CAPS Take 0.4 mg by mouth daily.      . traZODone (DESYREL) 100 MG tablet Take 100 mg by mouth at bedtime.    Marland Kitchen albuterol (PROAIR HFA) 108 (90 BASE) MCG/ACT inhaler Inhale 2 puffs into the lungs every 6 (six) hours as needed for wheezing or shortness of breath.    Marland Kitchen albuterol (PROVENTIL) (2.5 MG/3ML) 0.083% nebulizer solution Take 2.5 mg by nebulization every 6 (six) hours as needed for wheezing or shortness of breath.    Review of Systems     Objective:   Physical Exam  Constitutional: He is oriented to person, place, and time. He appears well-developed and well-nourished. No distress.  HENT:  Head: Normocephalic and atraumatic.  Mouth/Throat: Oropharynx is clear and moist. No oropharyngeal exudate.  Eyes: Pupils are equal, round, and reactive to light. No scleral icterus.  Neck: Normal range of motion. Neck supple.  Cardiovascular: Normal rate, regular rhythm and normal heart sounds.   Pulmonary/Chest: Effort normal and breath sounds normal. No respiratory distress.  Abdominal: Soft. Bowel sounds are normal. He exhibits no distension. There is no tenderness.  SOFT GREEN STOOL IN BAG  Musculoskeletal: He exhibits edema (TRACE BIL LEs).  Lymphadenopathy:    He has no cervical adenopathy.  Neurological: He is alert and oriented to person, place, and time.  NO  NEW FOCAL DEFICITS   Psychiatric: He has a normal mood and  affect.  Vitals reviewed.         Assessment & Plan:

## 2014-11-20 NOTE — Assessment & Plan Note (Signed)
SX UNCONTROLLED ON PROTONIX DAILY/  INCREASE PROTONIX. TAKE 30 MINUTES PRIOR TO MEALS TWICE DAILY. LOW FAT/DIABETIC DIET AVOID TRIGGERS FOR REFLUX FOLLOW UP IN 4 MOS.

## 2014-11-20 NOTE — Assessment & Plan Note (Signed)
LOSING WEIGHT. LAST HFP WNLs EXCEPT ALB 3.3.  CONTINUE YOUR WEIGHT LOSS EFFORTS. MONITOR HFP 2X/YEAR

## 2014-11-20 NOTE — Patient Instructions (Signed)
CONTINUE LOW FAT/DIABETIC DIET.   CONTINUE YOUR WEIGHT LOSS EFFORTS.  INCREASE PROTONIX. TAKE 30 MINUTES PRIOR TO MEALS TWICE DAILY.  AVOID TRIGGERS FOR REFLUX. SEE INFO BELOW.  FOLLOW UP IN 4 MOS.    Lifestyle and home remedies TO HELP CONTROL HEARTBURN.  You may eliminate or reduce the frequency of heartburn by making the following lifestyle changes:  . Control your weight. Being overweight is a major risk factor for heartburn and GERD. Excess pounds put pressure on your abdomen, pushing up your stomach and causing acid to back up into your esophagus.   . Eat smaller meals. 4 TO 6 MEALS A DAY. This reduces pressure on the lower esophageal sphincter, helping to prevent the valve from opening and acid from washing back into your esophagus.   Dolphus Jenny your belt. Clothes that fit tightly around your waist put pressure on your abdomen and the lower esophageal sphincter.  .  . Eliminate heartburn triggers. Everyone has specific triggers.  .   Common triggers such as fatty or fried foods, spicy food, tomato sauce, carbonated beverages, alcohol, chocolate, mint, garlic, onion, caffeine and nicotine may make heartburn worse.   Marland Kitchen Avoid stooping or bending. Tying your shoes is OK. Bending over for longer periods to weed your garden isn't, especially soon after eating.   . Don't lie down after a meal. Wait at least three to four hours after eating before going to bed, and don't lie down right after eating.   Marland Kitchen PLACE THE HEAD OF YOUR BED ON 6 INCH BLOCKS.  Alternative medicine . Several home remedies exist for treating GERD, but they provide only temporary relief. They include drinking baking soda (sodium bicarbonate) added to water or drinking other fluids such as baking soda mixed with cream of tartar and water. . Although these liquids create temporary relief by neutralizing, washing away or buffering acids, eventually they aggravate the situation by adding gas and fluid to your stomach,  increasing pressure and causing more acid reflux. Further, adding more sodium to your diet may increase your blood pressure and add stress to your heart, and excessive bicarbonate ingestion can alter the acid-base balance in your body.

## 2014-11-23 NOTE — Progress Notes (Signed)
CC'ED TO PCP 

## 2014-11-26 ENCOUNTER — Ambulatory Visit (INDEPENDENT_AMBULATORY_CARE_PROVIDER_SITE_OTHER): Payer: Medicare Other | Admitting: Urology

## 2014-11-26 DIAGNOSIS — N390 Urinary tract infection, site not specified: Secondary | ICD-10-CM

## 2014-11-26 DIAGNOSIS — N359 Urethral stricture, unspecified: Secondary | ICD-10-CM | POA: Diagnosis not present

## 2014-12-10 ENCOUNTER — Telehealth: Payer: Self-pay | Admitting: Gastroenterology

## 2014-12-10 NOTE — Telephone Encounter (Signed)
I spoke with the pt. He is emptying his bag around 12x a day and some during the night. He said he is having a hard time sleeping. He has tried immodium and its not helping. He is watching his diet, no new or unusual foods. He is not taking metformin anymore. No recent abx. No fever. He wants to know what he needs to do now.

## 2014-12-10 NOTE — Telephone Encounter (Signed)
PATIENT CALLED STATING THAT EVERYTHING HE IS EATING IS GIVING HIM DIARRHEA.  HAS TRIED OTC STUFF AND NOTHING IS WORKING. [place]  [phone]

## 2014-12-11 MED ORDER — DICYCLOMINE HCL 10 MG PO CAPS: ORAL_CAPSULE | ORAL | Status: DC

## 2014-12-11 NOTE — Telephone Encounter (Signed)
Pt called again and he states he needs help with his diarrhea. He says he has tried the imodium and nothing is working.

## 2014-12-11 NOTE — Addendum Note (Signed)
Addended by: Danie Binder on: 12/11/2014 04:07 PM   Modules accepted: Orders

## 2014-12-11 NOTE — Telephone Encounter (Addendum)
PLEASE CALL PT. NOT SURE WHAT'S CAUSING HIS DIARRHEA. IT DOESN'T SOUND LIKE A CROHN;S FLARE. IT COULD BE A VIRUS OR UTI. HE SHOULD AVOID DIARY. TAKE A PROBIOTIC DAILY. USE BENTYL 10 MG 1-2 PO QAC AND HS. HE MAY NEED HIS URINE CHECKED BY HIS PCP. CALL IN 7 DAYS IF HIS DIARRHEA IS NOT BETTER.

## 2014-12-12 NOTE — Telephone Encounter (Signed)
Called pt and he states that he does have a UTI and is taking an antibiotic.  Informed pt that antibiotics can cause diarrhea. Pt acknowledged. Pt states he will call back in a week if he isn't better.

## 2015-02-13 ENCOUNTER — Encounter: Payer: Self-pay | Admitting: Gastroenterology

## 2015-03-31 ENCOUNTER — Ambulatory Visit: Payer: Medicare Other | Admitting: Orthopedic Surgery

## 2015-04-02 ENCOUNTER — Ambulatory Visit: Payer: Medicare Other | Admitting: Gastroenterology

## 2015-04-10 ENCOUNTER — Other Ambulatory Visit: Payer: Self-pay | Admitting: Nurse Practitioner

## 2015-04-14 ENCOUNTER — Ambulatory Visit: Payer: Medicare Other | Admitting: Orthopedic Surgery

## 2015-04-21 ENCOUNTER — Telehealth: Payer: Self-pay | Admitting: Gastroenterology

## 2015-04-21 NOTE — Telephone Encounter (Signed)
Check stool studies. Has he had antibiotics recently? Any fever? Don't think we are dealing with Crohn's flare. Need more info.

## 2015-04-21 NOTE — Telephone Encounter (Signed)
[phone]  Please call patient, he is having stomach pain and wanted to speak to [facility]

## 2015-04-21 NOTE — Telephone Encounter (Signed)
I called pt and he has been having LLQ pain constant since last night. He rates the pain at an 8 now.  He has had diarrhea 3-4 times today.  Said everything he eats is causing him to cramp. He is aware to go to the ED if his pain gets worse. He is aware that Dr. Oneida Alar is off today. Routing to Laban Emperor, NP to advise!

## 2015-04-22 NOTE — Telephone Encounter (Signed)
Pt called back and said his stomach hurts really bad when he eats anything. He ate a piece of [place] and and he is really cramping. He is taking the Bentyl. He rates the pain at a 7 and a half now.  Please advise!

## 2015-04-22 NOTE — Telephone Encounter (Signed)
Pt is aware and will call if he has further problems.

## 2015-04-22 NOTE — Telephone Encounter (Signed)
LMOM to call.

## 2015-04-22 NOTE — Telephone Encounter (Signed)
PLEASE CALL PT. IF HE IS HAVING SEVER ABDOMINAL PAIN HE SHOULD CALL EMS TO TAKE HIM TO THE NEAREST ED TO BE EVALUATED FOR A BOWEL OBSTRUCTION.

## 2015-04-22 NOTE — Telephone Encounter (Signed)
He should have Bentyl at home. May take 1 every 6 hours prn cramping. Would stick with low residue diet for next 24 hours. Any further diarrhea can call. Monitor for fevers or worsening of pain.

## 2015-04-22 NOTE — Telephone Encounter (Signed)
Pt said he is not having any diarrhea now. He has not been on any antibiotics recently.  He has not had any fever. He is still having some left sided abdominal pain but he rates that at a 6 today. Any recommendations?

## 2015-04-22 NOTE — Telephone Encounter (Signed)
PT is aware. He said it has eased up some, but he will go if it gets worse.

## 2015-05-22 ENCOUNTER — Ambulatory Visit: Payer: Medicare Other | Admitting: Orthopedic Surgery

## 2015-05-26 ENCOUNTER — Encounter: Payer: Self-pay | Admitting: Orthopedic Surgery

## 2015-05-26 ENCOUNTER — Ambulatory Visit (INDEPENDENT_AMBULATORY_CARE_PROVIDER_SITE_OTHER): Payer: Medicaid Other | Admitting: Orthopedic Surgery

## 2015-05-26 VITALS — BP 119/66 | Ht 66.0 in | Wt 220.0 lb

## 2015-05-26 DIAGNOSIS — M25331 Other instability, right wrist: Secondary | ICD-10-CM | POA: Diagnosis not present

## 2015-05-26 NOTE — Patient Instructions (Signed)
We will schedule MRI for you and call you with appt (mon,wed,fri and mornings best for scheduling) will call result

## 2015-05-26 NOTE — Progress Notes (Signed)
Patient ID: [name], male   DOB: 1966-05-21, 49 y.o.   MRN: [id number]   Chief Complaint  Patient presents with  . Wrist Pain    right wrist pain x 1 month, hx of old injury, ref. [facility] fam. med.    [name] is a 49 y.o. male.   HPI 49 year old male presents for the last 6 years  He said he fell landed on his right wrist 6 years ago since that time he said persistent pain catching burning locking right wrist over the scaphoid tubercle and wrist joint. He rates his pain 8 out of 10. He denies previous treatment.  He has 7 allergies and basically can't take anti-inflammatory cell or codeine.   Review of Systems See hpi  Past Medical History  Diagnosis Date  . Crohn's colitis     s/p total colectomy with ileostomy in 2009  . Nystagmus   . Bipolar disorder   . Asthma   . GERD (gastroesophageal reflux disease)   . Hyperlipidemia   . Insomnia   . Enterococcus UTI 2009  . 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 DVT (deep venous thrombosis) 2009    right upper extremity due to PICC  . S/P endoscopy Sept 2012    mild gastritis, small hiatal hernia, no ulcers  . Crohn's disease of small intestine Sept 2012    ileoscopy: multiple ulcers likely secondary to Crohn's  . Schizophrenia, acute   . Migraine headache   . Hernia   . Colostomy in place   . Depression   . Schizophrenia   . Arthritis     Allergies  Allergen Reactions  . Aspirin     REACTION: unknown reaction  . Bactrim [Sulfamethoxazole-Trimethoprim]     ABD CRAMPS AND DIARRHEA  . Codeine     REACTION: unknown reaction  . Ibuprofen     REACTION: unknown reaction  . Penicillins     REACTION: unknown reaction  . Remicade [Infliximab]     COULDN'T BREATHE  . Tramadol Hcl     REACTION: unknown reaction  . Vicodin [Hydrocodone-Acetaminophen] Nausea Only    Current Outpatient Prescriptions  Medication Sig Dispense Refill  . acetaminophen (TYLENOL) 500 MG  tablet Take 1,000 mg by mouth every 6 (six) hours as needed. For pain     . albuterol (PROAIR HFA) 108 (90 BASE) MCG/ACT inhaler Inhale 2 puffs into the lungs every 6 (six) hours as needed for wheezing or shortness of breath.    Marland Kitchen albuterol (PROVENTIL) (2.5 MG/3ML) 0.083% nebulizer solution Take 2.5 mg by nebulization every 6 (six) hours as needed for wheezing or shortness of breath.    Marland Kitchen atorvastatin (LIPITOR) 80 MG tablet Take 80 mg by mouth daily.    . benztropine (COGENTIN) 0.5 MG tablet Take 0.5 mg by mouth daily.     . Calcium Carbonate-Vitamin D (CALCIUM 600 + D PO) Take 1 tablet by mouth 2 (two) times daily.     Marland Kitchen dicyclomine (BENTYL) 10 MG capsule 1-2 PO QAC AND HS 62 capsule 11  . divalproex (DEPAKOTE ER) 250 MG 24 hr tablet Take 250 mg by mouth at bedtime.     . divalproex (DEPAKOTE) 500 MG 24 hr tablet Take 1,000 mg by mouth every evening.     . folic acid (FOLVITE) 1 MG tablet Take 1 mg by mouth daily.      . furosemide (LASIX) 40 MG tablet Take 40  mg by mouth daily.      Marland Kitchen gabapentin (NEURONTIN) 300 MG capsule Take 900 mg by mouth 3 (three) times daily.     . mercaptopurine (PURINETHOL) 50 MG tablet TAKE 2 TABLETS DAILY ON EMPTY STOMACH 1 HOUR BEFORE OR 2 HOURS AFTER MEAL 56 tablet 3  . metFORMIN (GLUCOPHAGE) 500 MG tablet Take by mouth 2 (two) times daily with a meal.    . nitrofurantoin, macrocrystal-monohydrate, (MACROBID) 100 MG capsule Take 1 capsule (100 mg total) by mouth 2 (two) times daily. 20 capsule 0  . pantoprazole (PROTONIX) 40 MG tablet 1 PO 30 MINS PRIOR TO BREAKFAST AND SUPPER. 60 tablet 11  . potassium chloride SA (K-DUR,KLOR-CON) 20 MEQ tablet Take 20 mEq by mouth daily.     . risperiDONE (RISPERDAL) 2 MG tablet Take 2 mg by mouth at bedtime. 3.5 tabs qhs    . sertraline (ZOLOFT) 50 MG tablet Take 50 mg by mouth daily.    . Tamsulosin HCl (FLOMAX) 0.4 MG CAPS Take 0.4 mg by mouth daily.      . traZODone (DESYREL) 100 MG tablet Take 100 mg by mouth at bedtime.      No current facility-administered medications for this visit.     Physical Exam Blood pressure 119/66, height 5' 6"  (1.676 m), weight 220 lb (99.791 kg). Physical Exam  The patient is well developed well nourished and well groomed.   Orientation to person place and time is normal   Mood is pleasant.  Ambulatory status noncontributory but normal    His right wrist is not swollen but tender over the scaphoid tubercle and over the FCR and the FCR insertion has painful range of motion in all planes. 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

## 2015-05-28 ENCOUNTER — Ambulatory Visit: Payer: Medicare Other | Admitting: Gastroenterology

## 2015-06-12 ENCOUNTER — Ambulatory Visit: Payer: Medicaid Other | Admitting: Gastroenterology

## 2015-07-09 ENCOUNTER — Encounter (INDEPENDENT_AMBULATORY_CARE_PROVIDER_SITE_OTHER): Payer: Self-pay | Admitting: Gastroenterology

## 2015-07-09 ENCOUNTER — Encounter: Payer: Self-pay | Admitting: Gastroenterology

## 2015-07-09 ENCOUNTER — Telehealth: Payer: Self-pay | Admitting: Gastroenterology

## 2015-07-09 NOTE — Progress Notes (Signed)
No show.  No call

## 2015-07-09 NOTE — Telephone Encounter (Signed)
PATIENT WAS A NO SHOW AND LETTER SENT  °

## 2015-07-09 NOTE — Telephone Encounter (Signed)
REVIEWED-NO ADDITIONAL RECOMMENDATIONS. 

## 2015-07-22 ENCOUNTER — Telehealth: Payer: Self-pay | Admitting: Orthopedic Surgery

## 2015-07-22 NOTE — Telephone Encounter (Signed)
Patient called to follow up on MRI from office visit date 05/26/15; states we may have been calling, and he has had another phone # change: phone # updated in system to [phone]. Please advise.

## 2015-07-30 ENCOUNTER — Encounter: Payer: Self-pay | Admitting: Gastroenterology

## 2015-07-30 ENCOUNTER — Ambulatory Visit (INDEPENDENT_AMBULATORY_CARE_PROVIDER_SITE_OTHER): Payer: Medicaid Other | Admitting: Gastroenterology

## 2015-07-30 VITALS — BP 99/64 | HR 68 | Temp 98.2°F | Ht 66.0 in | Wt 214.2 lb

## 2015-07-30 DIAGNOSIS — K760 Fatty (change of) liver, not elsewhere classified: Secondary | ICD-10-CM

## 2015-07-30 DIAGNOSIS — K219 Gastro-esophageal reflux disease without esophagitis: Secondary | ICD-10-CM | POA: Diagnosis not present

## 2015-07-30 DIAGNOSIS — K5000 Crohn's disease of small intestine without complications: Secondary | ICD-10-CM | POA: Diagnosis not present

## 2015-07-30 MED ORDER — PANTOPRAZOLE SODIUM 40 MG PO TBEC: DELAYED_RELEASE_TABLET | ORAL | Status: DC

## 2015-07-30 MED ORDER — DICYCLOMINE HCL 10 MG PO CAPS: ORAL_CAPSULE | ORAL | Status: DC

## 2015-07-30 MED ORDER — MERCAPTOPURINE 50 MG PO TABS: 100.0000 mg | ORAL_TABLET | Freq: Every day | ORAL | Status: DC

## 2015-07-30 NOTE — Progress Notes (Signed)
Subjective:    Patient ID: [name], male    DOB: 29-Apr-1966, 49 y.o.   MRN: [id number] CLAGGETT,ELIN, PA-C   HPI MOVING [place], [place]. HA TRANSPORTATION. HAS A FRIEND THAT IS BORROWING MONEY FOR DRUGS. DOING WELL JUST CELEBRATED A BIRTHDAY BY HIMSELF. EMPTIES BAG: 3-4 TIMES A DAY. NO DIARRHEA. GOT FLU SHOT. HAD CHICKEN POX AS A CHILD. HASN'T HAD SHINGLES OR PNEUMONAX.  PT DENIES FEVER, CHILLS, HEMATOCHEZIA, nausea, vomiting, melena, diarrhea, CHEST PAIN, SHORTNESS OF BREATH,  CHANGE IN BOWEL IN HABITS, constipation, abdominal pain, problems swallowing, OR heartburn or indigestion.   Past Medical History  Diagnosis Date  . Crohn's colitis     s/p total colectomy with ileostomy in 2009  . Nystagmus   . Bipolar disorder   . Asthma   . GERD (gastroesophageal reflux disease)   . Hyperlipidemia   . Insomnia   . Enterococcus UTI 2009  . 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 DVT (deep venous thrombosis) 2009    right upper extremity due to PICC  . S/P endoscopy Sept 2012    mild gastritis, small hiatal hernia, no ulcers  . Crohn's disease of small intestine Sept 2012    ileoscopy: multiple ulcers likely secondary to Crohn's  . Schizophrenia, acute   . Migraine headache   . Hernia   . Colostomy in place   . Depression   . Schizophrenia   . Arthritis     Past Surgical History  Procedure Laterality Date  . Total colectomy  2009    with ileostomy at [facility] for refractory disease   . Total shoulder replacement      bilateral  . Total hip arthroplasty      right, due to avascular necrosis from chronic prednisone  . Kidney surgery    . Appendectomy    . Eye surgery    . Esophagogastroduodenoscopy  07/2010    gastritis,  bx neg for H.Pylori  . Small bowel capsule study  08/2010    few tiny nonbleeding erosions/ulcers ?secondary to relafen or Crohn's. Entire small bowel not seen.   . Ileoscopy  06/29/2011    SLF:  ulcers,multiple/small HH/mild gastritis  . Laparotomy N/A 06/05/2013    Procedure: EXPLORATORY LAPAROTOMY;  Surgeon: Donato Heinz, MD;  Location: [facility];  Service: General;  Laterality: N/A;  . Lysis of adhesion N/A 06/05/2013    Procedure: LYSIS OF ADHESIONS;  Surgeon: Donato Heinz, MD;  Location: [facility];  Service: General;  Laterality: N/A;  . Esophagogastroduodenoscopy N/A 01/08/2014    SLF:NO SOURCE FOR ODYNOPHAGIA IDENTIFIED/Multiple small ulcers in the gastric antrum   Allergies  Allergen Reactions  . Aspirin     REACTION: unknown reaction  . Bactrim [Sulfamethoxazole-Trimethoprim]     ABD CRAMPS AND DIARRHEA  . Codeine     REACTION: unknown reaction  . Ibuprofen     REACTION: unknown reaction  . Penicillins     REACTION: unknown reaction  . Remicade [Infliximab]     COULDN'T BREATHE  . Tramadol Hcl     REACTION: unknown reaction  . Vicodin [Hydrocodone-Acetaminophen] Nausea Only    Current Outpatient Prescriptions  Medication Sig Dispense Refill  . acetaminophen (TYLENOL) 500 MG tablet Take 1,000 mg by mouth every 6 (six) hours as needed. For pain     . albuterol (PROAIR HFA) 108 (90 BASE) MCG/ACT inhaler Inhale 2 puffs into the lungs every 6 (six) hours as  needed for wheezing or shortness of breath.    Marland Kitchen albuterol (PROVENTIL) (2.5 MG/3ML) 0.083% nebulizer solution Take 2.5 mg by nebulization every 6 (six) hours as needed for wheezing or shortness of breath.    Marland Kitchen atorvastatin (LIPITOR) 80 MG tablet Take 80 mg by mouth daily.    . benztropine (COGENTIN) 0.5 MG tablet Take 0.5 mg by mouth daily.     . Calcium Carbonate-Vitamin D (CALCIUM 600 + D PO) Take 1 tablet by mouth 2 (two) times daily.     Marland Kitchen dicyclomine (BENTYL) 10 MG capsule 1-2 PO QAC AND HS 1 PO QAC TID   . divalproex (DEPAKOTE ER) 250 MG 24 hr tablet Take 250 mg by mouth at bedtime.     . divalproex (DEPAKOTE) 500 MG 24 hr tablet Take 1,000 mg by mouth every evening.     . folic acid (FOLVITE) 1 MG tablet Take 1  mg by mouth daily.      . furosemide (LASIX) 40 MG tablet Take 40 mg by mouth daily.      Marland Kitchen gabapentin (NEURONTIN) 300 MG capsule Take 900 mg by mouth 3 (three) times daily.     . mercaptopurine (PURINETHOL) 50 MG tablet TAKE 2 TABLETS DAILY ON EMPTY STOMACH 1 HOUR BEFORE OR 2 HOURS AFTER MEAL    . METFORMIN 500 MG BID    . pantoprazole (PROTONIX) 40 MG tablet 1 PO 30 MINS PRIOR TO meals bid     . potassium chloride SA (K-DUR,KLOR-CON) 20 MEQ tablet Take 20 mEq by mouth daily.     . risperiDONE (RISPERDAL) 2 MG tablet Take 2 mg by mouth at bedtime. 3.5 tabs qhs    . sertraline (ZOLOFT) 50 MG tablet Take 50 mg by mouth daily.    Marland Kitchen sulfamethoxazole-trimethoprim (BACTRIM DS,SEPTRA DS) 800-160 MG tablet Take 1 tablet by mouth 2 (two) times daily.    . Tamsulosin HCl (FLOMAX) 0.4 MG CAPS Take 0.4 mg by mouth daily.      . traZODone (DESYREL) 100 MG tablet Take 100 mg by mouth at bedtime.    .       Review of Systems PER HPI OTHERWISE ALL SYSTEMS ARE NEGATIVE.    Objective:   Physical Exam  Constitutional: He is oriented to person, place, and time. He appears well-developed and well-nourished. No distress.  HENT:  Head: Normocephalic and atraumatic.  Mouth/Throat: Oropharynx is clear and moist. No oropharyngeal exudate.  Eyes: Pupils are equal, round, and reactive to light. No scleral icterus.  Neck: Normal range of motion. Neck supple.  Cardiovascular: Normal rate, regular rhythm and normal heart sounds.   Pulmonary/Chest: Effort normal and breath sounds normal. No respiratory distress.  Abdominal: Soft. Bowel sounds are normal. He exhibits no distension. There is no tenderness.  Musculoskeletal: He exhibits no edema.  Lymphadenopathy:    He has no cervical adenopathy.  Neurological: He is alert and oriented to person, place, and time.  Psychiatric: He has a normal mood and affect.  Vitals reviewed.     Assessment & Plan:

## 2015-07-30 NOTE — Assessment & Plan Note (Signed)
WEIGHT STABLE.  CONTINUE YOUR WEIGHT LOSS EFFORTS. FOLLOW UP IN 6 MOS. MERRY CHRISTMAS AND HAPPY NEW YEAR!

## 2015-07-30 NOTE — Assessment & Plan Note (Signed)
SYMPTOMS CONTROLLED/RESOLVED.  CONTINUE PROTONIX. TAKE 30 MINUTES PRIOR TO BREAKFAST. FOLLOW UP IN 6 MOS. MERRY CHRISTMAS AND HAPPY NEW YEAR!

## 2015-07-30 NOTE — Progress Notes (Signed)
cc'ed to pcp °

## 2015-07-30 NOTE — Patient Instructions (Addendum)
CONTINUE YOUR WEIGHT LOSS EFFORTS.  CONTINUE YOUR EFFORTS to stop smoking.  CONTINUE PROTONIX. TAKE 30 MINUTES PRIOR TO BREAKFAST.  TAKE DICYCLOMINE 10 MG TABLETS ONE OR TWO 30 MINUTES PRIOR MEALS UP TO THREE TIMES A DAY. IT MAY CAUSE DROWSINESS, DRY EYES/MOUTH, BLURRY VISION, OR DIFFICULTY URINATING.  Continue mercaptopurine. complete your labs.  FOLLOW UP IN 6 MOS.

## 2015-07-30 NOTE — Assessment & Plan Note (Signed)
SYMPTOMS CONTROLLED/RESOLVED.  CONTINUE YOUR EFFORTS to stop smoking. CONTINUE mercaptopurine. Complete HFP/CBC. FOLLOW UP IN 6 MOS.

## 2015-07-31 ENCOUNTER — Other Ambulatory Visit: Payer: Self-pay

## 2015-07-31 DIAGNOSIS — K5000 Crohn's disease of small intestine without complications: Secondary | ICD-10-CM

## 2015-08-26 ENCOUNTER — Telehealth: Payer: Self-pay

## 2015-08-26 NOTE — Telephone Encounter (Signed)
PLEASE CALL PT. IF HE IS HAVING A NORMAL BM  HIS ABD PAIN IS NOT RELATED TO HIS CROHN'S DISEASE. IT IS LIKELY MUSCULOSKELETAL ABD WALL PAIN DUE TO COUGHING.

## 2015-08-26 NOTE — Telephone Encounter (Signed)
Pt called and said he is having some abdominal pain and cramps but only when he coughs. He is having normal BM's, no blood, no problem there. He has had a cough since Friday, non productive. He said PCP told him he does not have a cold, it is coming from smoking.  He said he is taking his Bentyl. Please advise!

## 2015-08-26 NOTE — Telephone Encounter (Signed)
Pt called back and left a vm that his stomach is now hurting when he eats. Please advise!

## 2015-08-27 NOTE — Telephone Encounter (Signed)
PT called and he is having diarrhea now, has changed bag x 5 this Am.  Still having the abdominal pain. Please advise!

## 2015-08-27 NOTE — Telephone Encounter (Signed)
PLEASE CALL PT. HE LIKELY HAS A VIRAL ILLNESS. HE MAY USE IMODIUM BEFORE BREAKFAST AND LUNCH TO DECREASE ABDOMINAL PAIN AND DIARRHEA. AVOID DIARY. CALL MON IF SYMPTOMS ARE NOT IMPROVED.

## 2015-08-27 NOTE — Telephone Encounter (Signed)
Left the complete message on Vm for pt and told him to call if he has questions.

## 2015-08-27 NOTE — Telephone Encounter (Signed)
PT is aware.

## 2015-10-01 ENCOUNTER — Telehealth: Payer: Self-pay

## 2015-10-01 NOTE — Telephone Encounter (Signed)
Lab results have not been faxed to [place]. I called pt and told him to have them fax to [place] and put ATTN Doris.

## 2015-10-01 NOTE — Telephone Encounter (Signed)
Pt called to check on lab results.

## 2015-10-07 NOTE — Telephone Encounter (Signed)
I sent a request for recent labs to be faxed to [place]

## 2015-10-07 NOTE — Telephone Encounter (Signed)
I called pt and told him we have not received the labs yet. Sending a note to Manuela Schwartz to [employer] and have them fax the labs to [place].

## 2015-10-08 ENCOUNTER — Other Ambulatory Visit: Payer: Self-pay

## 2015-10-08 DIAGNOSIS — K921 Melena: Secondary | ICD-10-CM

## 2015-10-08 NOTE — Telephone Encounter (Signed)
I called and requested the labs again. They said they have faxed them several times, but we have not seen them.

## 2015-10-08 NOTE — Telephone Encounter (Signed)
PT called today and per Manuela Schwartz we still have not received the lab reports from the PCP.  Pt is c/o of BLACK WATERY DIARRHEA. Michela Pitcher he has had it a couple of days and he is changing his bag every 2-3 min. He is not having any pain, just the watery diarrhea.  Please advise!

## 2015-10-08 NOTE — Telephone Encounter (Signed)
May have viral illness. Given "black" stool history I would like for him to have STAT CBC, Met-7.  If he feels weak, lightheaded, he should go to ER. Continue to drink plenty of fluids to keep urine light. Avoid dairy. Try some gatorade for the electrolytes.  May use Imodium up to TID OR bentyl 24m up to four times daily.  Call with progress report tomorrow.

## 2015-10-08 NOTE — Telephone Encounter (Signed)
Doris, Are the labs in question, recent labs?  Has he been on recent antibiotics? Anyone around him sick with diarrhea? Is he taking Pepto or Maalox/Mylanta?  Make sure he is drinking enough fluid to keep urine light yellow.

## 2015-10-08 NOTE — Telephone Encounter (Signed)
Magda Paganini, pt said he has not been on antibiotics recently. He has not been around anyone sick with diarrhea. He has not taken Pepto or Maalox or Mylanta, just Immodium AD.  He said he is getting a lot of water down and so far his urine is still light yellow.  Please advise!

## 2015-10-08 NOTE — Telephone Encounter (Signed)
Called and gave all of the info to the pt. Lab orders faxed to [facility]. He is going to try to find a ride for this afternoon, it takes him 3 days to order a ride.  He will call with progress report tomorrow.

## 2015-10-09 NOTE — Telephone Encounter (Signed)
Pt called and said he had the blood work done. He said he is feeling some better and changed his bag at 9:00 Am and has not had to change it again. He will let us know if he has more problems.

## 2015-10-10 NOTE — Telephone Encounter (Signed)
Pt called back and was informed.

## 2015-10-10 NOTE — Telephone Encounter (Signed)
Lab results placed in McConnell chair.

## 2015-10-10 NOTE — Telephone Encounter (Signed)
Reviewed labs dated 10/09/2015 Glucose 91, BUN 11, creatinine 0.82, sodium 142, potassium 4.5, white blood cell count 5600, hemoglobin 15.2, platelets 154,000. Please let patient know that his labs look excellent.  As before, may use Imodium up to 3 times a day or Bentyl 10 mg up to 4 times a day.  Go to the ER for warning signs as previously discussed. Otherwise please call on Monday with a progress report.

## 2015-10-10 NOTE — Telephone Encounter (Signed)
Pt was not home, had run to the store. I left message that labs look good but to try to call me before we leave at noon today.

## 2015-10-16 NOTE — Telephone Encounter (Signed)
Patient's MRI was denied back in August. I informed the patient and told him he should have got a letter from his insurance letter explaining this to him. I advised him to call his insurance company.

## 2015-12-16 ENCOUNTER — Ambulatory Visit (INDEPENDENT_AMBULATORY_CARE_PROVIDER_SITE_OTHER): Payer: Medicaid Other | Admitting: Urology

## 2015-12-16 DIAGNOSIS — N390 Urinary tract infection, site not specified: Secondary | ICD-10-CM

## 2015-12-16 DIAGNOSIS — N359 Urethral stricture, unspecified: Secondary | ICD-10-CM

## 2015-12-18 ENCOUNTER — Telehealth: Payer: Self-pay

## 2015-12-18 NOTE — Telephone Encounter (Signed)
SIGNED/FAXED TO [facility].

## 2015-12-18 NOTE — Telephone Encounter (Signed)
Faxed orders for pt's ostomy supplies to Dr. Oneida Alar at endo to sign and fax back to them.

## 2015-12-23 ENCOUNTER — Encounter: Payer: Self-pay | Admitting: Gastroenterology

## 2015-12-23 ENCOUNTER — Ambulatory Visit (INDEPENDENT_AMBULATORY_CARE_PROVIDER_SITE_OTHER): Payer: Medicaid Other | Admitting: Urology

## 2015-12-23 DIAGNOSIS — N390 Urinary tract infection, site not specified: Secondary | ICD-10-CM | POA: Diagnosis not present

## 2016-02-04 ENCOUNTER — Ambulatory Visit (INDEPENDENT_AMBULATORY_CARE_PROVIDER_SITE_OTHER): Payer: Medicaid Other | Admitting: Gastroenterology

## 2016-02-04 ENCOUNTER — Encounter: Payer: Self-pay | Admitting: Gastroenterology

## 2016-02-04 VITALS — BP 100/70 | HR 74 | Temp 98.8°F | Ht 66.0 in | Wt 191.2 lb

## 2016-02-04 DIAGNOSIS — K760 Fatty (change of) liver, not elsewhere classified: Secondary | ICD-10-CM | POA: Diagnosis not present

## 2016-02-04 DIAGNOSIS — K50018 Crohn's disease of small intestine with other complication: Secondary | ICD-10-CM | POA: Diagnosis not present

## 2016-02-04 DIAGNOSIS — K219 Gastro-esophageal reflux disease without esophagitis: Secondary | ICD-10-CM

## 2016-02-04 NOTE — Assessment & Plan Note (Signed)
INTENTIONAL WEIGHT LOSS, BMI > 30.  CONTINUE WEIGHT LOSS EFFORTS.  CHECK HFP FOLLOW UP IN 6 MOS.

## 2016-02-04 NOTE — Patient Instructions (Signed)
Complete your labs.  CONTINUE YOUR WEIGHT LOSS EFFORTS. YOU LOOK GREAT!  CONTINUE YOUR EFFORTS to stop smoking.  CONTINUE PROTONIX. TAKE 30 MINUTES PRIOR TO BREAKFAST AND SUPPER.  ADD VISCOUS LIDOCAINE 2 TSP 30 MINS PRIOR TO MEALS AND AT BEDTIME AS NEEDED TO HELP WITH HEARTBURN AND THROAT PAIN. YOU SHOULD SEPARATE DOSES BY 4 HOURS. NO MORE THAN 8 DOSES A DAY.  Continue mercaptopurine.   FOLLOW UP IN 6 MOS.

## 2016-02-04 NOTE — Progress Notes (Signed)
Subjective:    Patient ID: [name], male    DOB: 05-21-1966, 50 y.o.   MRN: [id number] CLAGGETT,ELIN, PA-C   HPI BOWELS PRETTY GOOD. CHANGING BAGS Q3-4 DAYS(SOFT TO LOSEE, NEVER FORMED). EMPTIES 4-5 TIMES A DAY DEPENDING ON WHAT HE EATS. NO BLOOD OR TARRY STOOL SIN BAG. HEARTBURN AFTER NIGHTTIME MEDS ~2X/WEEK. STARTED DRINKING PLENTY OF WATERA ND CUT DOWNON SODAS AND NOW LOSING WEIGHT AND LESS UTIs. CUT DOWN ON SMOKING TOO FROM 3 PKS TO 1 PK.  MOVING TO NONSMOKING A PT. PT DENIES FEVER, CHILLS, HEMATOCHEZIA, nausea, vomiting, melena, CHEST PAIN, SHORTNESS OF BREATH, CHANGE IN BOWEL IN HABITS, constipation, abdominal pain, OR problems swallowing.   Past Medical History  Diagnosis Date  . Crohn's colitis ([place])     s/p total colectomy with ileostomy in 2009  . Nystagmus   . Bipolar disorder ([place])   . Asthma   . GERD (gastroesophageal reflux disease)   . Hyperlipidemia   . Insomnia   . Enterococcus UTI 2009  . 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[facility])     right, s/p replacement, due to prednisone  . DVT (deep venous thrombosis) ([place]) 2009    right upper extremity due to PICC  . S/P endoscopy Sept 2012    mild gastritis, small hiatal hernia, no ulcers  . Crohn's disease of small intestine [facility]) Sept 2012    ileoscopy: multiple ulcers likely secondary to Crohn's  . Schizophrenia, acute ([place])   . Migraine headache   . Hernia   . Colostomy in place [facility])   . Depression   . Schizophrenia ([place])   . Arthritis   . PULMONARY EMBOLISM, HX OF 02/11/2009    Qualifier: Diagnosis of  By: Zeb Comfort    . DEEP VENOUS THROMBOPHLEBITIS, HX OF 02/11/2009    Qualifier: Diagnosis of  By: Zeb Comfort     Past Surgical History  Procedure Laterality Date  . Total colectomy  2009    with ileostomy at [facility] for refractory disease   . Total shoulder replacement      bilateral  . Total hip arthroplasty      right, due to avascular necrosis from chronic prednisone  . Kidney surgery      . Appendectomy    . Eye surgery    . Esophagogastroduodenoscopy  07/2010    gastritis,  bx neg for H.Pylori  . Small bowel capsule study  08/2010    few tiny nonbleeding erosions/ulcers ?secondary to relafen or Crohn's. Entire small bowel not seen.   . Ileoscopy  06/29/2011    SLF: ulcers,multiple/small HH/mild gastritis  . Laparotomy N/A 06/05/2013    Procedure: EXPLORATORY LAPAROTOMY;  Surgeon: Donato Heinz, MD;  Location: [facility];  Service: General;  Laterality: N/A;  . Lysis of adhesion N/A 06/05/2013    Procedure: LYSIS OF ADHESIONS;  Surgeon: Donato Heinz, MD;  Location: [facility];  Service: General;  Laterality: N/A;  . Esophagogastroduodenoscopy N/A 01/08/2014    SLF:NO SOURCE FOR ODYNOPHAGIA IDENTIFIED/Multiple small ulcers in the gastric antrum   Allergies  Allergen Reactions  . Aspirin     REACTION: unknown reaction  . Bactrim [Sulfamethoxazole-Trimethoprim]     ABD CRAMPS AND DIARRHEA  . Codeine     REACTION: unknown reaction  . Ibuprofen     REACTION: unknown reaction  . Penicillins     REACTION: unknown reaction  . Remicade [Infliximab]     COULDN'T BREATHE  . Tramadol Hcl     REACTION:  unknown reaction  . Vicodin [Hydrocodone-Acetaminophen] Nausea Only   Current Outpatient Prescriptions  Medication Sig Dispense Refill  . atorvastatin (LIPITOR) 80 MG tablet Take 80 mg by mouth daily.    . COGENTIN) 0.5 MG tablet Take 0.5 mg by mouth daily.     Marland Kitchen CALCIUM 600 + D PO Take 1 tablet by mouth 2 (two) times daily.     Marland Kitchen dicyclomine (BENTYL) 10 MG capsule 1-2 PO QAC AND HS 2-3x/day and hs   . DEPAKOTE ER 250 MG 24 hr tablet Take 250 mg by mouth at bedtime.     Marland Kitchen DEPAKOTE) 500 MG 24 hr tablet Take 1,000 mg by mouth every evening.     . folic acid (FOLVITE) 1 MG tablet Take 1 mg by mouth daily.      . furosemide (LASIX) 40 MG tablet Take 40 mg by mouth daily.      Marland Kitchen NEURONTIN) 300 MG capsule Take 900 mg by mouth 3 (three) times daily.     . mercaptopurine 50 MG tablet 100  MG QD    . GLUCOPHAGE 500 MG tablet Take by mouth 2 (two) times daily with a meal.    .      . PROTONIX 40 MG tablet 1 PO 30 MINS PRIOR TO BREAKFAST AND SUPPER.    Marland Kitchen K-DUR,KLOR-CON 20 MEQ tablet Take 20 mEq by mouth daily.     . risperiDONE (RISPERDAL) 2 MG tablet Take 2 mg by mouth at bedtime. 3.5 tabs qhs    . sertraline (ZOLOFT) 50 MG tablet Take 50 mg by mouth daily.    Marland Kitchen FLOMAX 0.4 MG CAPS Take 0.4 mg by mouth daily.      . traZODone (DESYREL) 100 MG tablet Take 100 mg by mouth at bedtime.    . TYLENOL 500 MG tablet Take 1,000 mg by mouth every 6 (six) hours as needed. For pain     . albuterol (PROAIR HFA) 108 (90 BASE) MCG/ACT inhaler Inhale 2 puffs Q6H PRN for wheezing or shortness of breath.    Marland Kitchen albuterol 0.083% nebulizer solution Take 2.5 mgQ6H PRN for wheezing or shortness of breath.    .       Review of Systems PER HPI OTHERWISE ALL SYSTEMS ARE NEGATIVE.     Objective:   Physical Exam  Constitutional: He is oriented to person, place, and time. He appears well-developed and well-nourished. No distress.  HENT:  Head: Normocephalic and atraumatic.  Mouth/Throat: Oropharynx is clear and moist. No oropharyngeal exudate.  Eyes: Pupils are equal, round, and reactive to light. No scleral icterus.  Neck: Normal range of motion. Neck supple.  Cardiovascular: Normal rate, regular rhythm and normal heart sounds.   Pulmonary/Chest: Effort normal and breath sounds normal. No respiratory distress.  Abdominal: Soft. Bowel sounds are normal. He exhibits no distension. There is no tenderness.  INCISIONS WELL HEALED-midline, ostomy in RLQ with SOFT brown stool in bag  Musculoskeletal: He exhibits no edema.  Lymphadenopathy:    He has no cervical adenopathy.  Neurological: He is alert and oriented to person, place, and time.  Psychiatric: He has a normal mood and affect.  Vitals reviewed.     Assessment & Plan:

## 2016-02-04 NOTE — Assessment & Plan Note (Addendum)
SYMPTOMS CONTROLLED/RESOLVED ON MCP 100 MG DAILY. OSTOMY IN RLQ.  Complete CBC/HFP.  Continue mercaptopurine.   FOLLOW UP IN 6 MOS.

## 2016-02-04 NOTE — Progress Notes (Signed)
ON RECALL  °

## 2016-02-04 NOTE — Progress Notes (Signed)
cc'ed to pcp °

## 2016-02-04 NOTE — Assessment & Plan Note (Signed)
SYMPTOMS NOT IDEALLY CONTROLLED ON PPI BID.  CONTINUE YOUR WEIGHT LOSS EFFORTS.  CONTINUE YOUR EFFORTS to stop smoking. CONTINUE PROTONIX. TAKE 30 MINUTES PRIOR TO BREAKFAST AND SUPPER. ADD VISCOUS LIDOCAINE 2 TSP 30 MINS PRIOR TO MEALS AND AT BEDTIME AS NEEDED TO HELP WITH HEARTBURN AND THROAT PAIN. YOU SHOULD SEPARATE DOSES BY 4 HOURS. NO MORE THAN 8 DOSES A DAY. OPV IN 6 MOS

## 2016-02-05 ENCOUNTER — Telehealth: Payer: Self-pay

## 2016-02-05 MED ORDER — LIDOCAINE VISCOUS 2 % MT SOLN: OROMUCOSAL | Status: DC

## 2016-02-05 NOTE — Telephone Encounter (Signed)
PT called to say his Viscous Lidocaine was not at the pharmacy.

## 2016-02-05 NOTE — Telephone Encounter (Signed)
PLEASE CALL PT.  Rx sent.

## 2016-02-09 NOTE — Telephone Encounter (Signed)
Pt is aware and has received the prescription.

## 2016-02-19 ENCOUNTER — Telehealth: Payer: Self-pay

## 2016-02-19 NOTE — Telephone Encounter (Signed)
Pt called and said he had some broccoli spears for supper on Monday night. He said they were not cooked soft and he has had abdominal pain since then. He went a day and didn't have a BM, but now he is having BM's but still having some pain around the stoma. Routing to Laban Emperor, NP in Dr. Oneida Alar absence.

## 2016-02-19 NOTE — Telephone Encounter (Signed)
PT is aware.

## 2016-02-19 NOTE — Telephone Encounter (Signed)
Avoid this food in the future. If not having diarrhea, doubt having acute issues. Continue Protonix BID. Sounds like it is improving. Monitor for bleeding, abdominal distension, diarrhea.   Orvil Feil, ANP-BC [facility]

## 2016-03-04 ENCOUNTER — Telehealth: Payer: Self-pay | Admitting: Gastroenterology

## 2016-03-04 DIAGNOSIS — R197 Diarrhea, unspecified: Secondary | ICD-10-CM

## 2016-03-04 NOTE — Telephone Encounter (Signed)
[phone]  PT CALLED STATING THAT HIS STOMACH IS HURTING WHEN HE TAKES A BREATH OR BENDS OVER, ALSO HAS DIARRHEA.   PLEASE ADVISE.Marland Kitchen

## 2016-03-04 NOTE — Telephone Encounter (Signed)
PLEASE CALL PT. HE SHOULD DRINK WATER TO KEEP YOUR URINE LIGHT YELLOW. HE SHOULD SUBMIT URINE TO LOOK FOR A UTI AND STOLL TO LOOK FOR C DIFF. USE KAOPECATE AS NEEDED FOR LOOSE STOOL UNTIL HIS STOOL TEST IS COMPLETE. HE SHOULD GO TO ED FOR SEVERE ABDOMINAL PAIN.

## 2016-03-04 NOTE — Telephone Encounter (Signed)
Pt is aware. I am leaving the orders and container at front desk. He will try to get someone to come by and pick up for him and then go to [facility].

## 2016-03-04 NOTE — Addendum Note (Signed)
Addended by: Danie Binder on: 03/04/2016 12:02 PM   Modules accepted: Orders

## 2016-03-04 NOTE — Telephone Encounter (Signed)
Said he does not have a way to get it today, but will have a way tomorrow. He will go to the ED if he worsens.

## 2016-03-04 NOTE — Telephone Encounter (Signed)
I called pt. He said he has had a lot of diarrhea in the last 24 hours. No blood in stool. No fever. No n.v. His abdominal pain is in the center of his abdomen and the pain has been constant since yesterday. He rates the pain at an 8 now. Hurts worse when he bends. He ate a chicken patty sandwich and bologna sandwich yesterday. Today he has had a waffle. He usually does not eat the chicken and bologna sandwiches. I told him if the pain worsens to go to the ED. I will let Dr. Oneida Alar know and let her advise!

## 2016-03-10 LAB — HEPATIC FUNCTION PANEL
ALT: 21 U/L (ref 9–46)
AST: 17 U/L (ref 10–35)
Albumin: 4 g/dL (ref 3.6–5.1)
Alkaline Phosphatase: 56 U/L (ref 40–115)
BILIRUBIN DIRECT: 0.2 mg/dL (ref <=0.2)
BILIRUBIN INDIRECT: 0.6 mg/dL (ref 0.2–1.2)
TOTAL PROTEIN: 6.3 g/dL (ref 6.1–8.1)
Total Bilirubin: 0.8 mg/dL (ref 0.2–1.2)

## 2016-03-10 LAB — CBC WITH DIFFERENTIAL/PLATELET
BASOS ABS: 0 {cells}/uL (ref 0–200)
BASOS PCT: 0 %
Eosinophils Absolute: 58 cells/uL (ref 15–500)
Eosinophils Relative: 1 %
HEMATOCRIT: 45.7 % (ref 38.5–50.0)
Hemoglobin: 16 g/dL (ref 13.2–17.1)
LYMPHS ABS: 1566 {cells}/uL (ref 850–3900)
LYMPHS PCT: 27 %
MCH: 35.4 pg — ABNORMAL HIGH (ref 27.0–33.0)
MCHC: 35 g/dL (ref 32.0–36.0)
MCV: 101.1 fL — AB (ref 80.0–100.0)
MONOS PCT: 7 %
MPV: 10.3 fL (ref 7.5–12.5)
Monocytes Absolute: 406 cells/uL (ref 200–950)
NEUTROS PCT: 65 %
Neutro Abs: 3770 cells/uL (ref 1500–7800)
PLATELETS: 196 10*3/uL (ref 140–400)
RBC: 4.52 MIL/uL (ref 4.20–5.80)
RDW: 16.1 % — AB (ref 11.0–15.0)
WBC: 5.8 10*3/uL (ref 3.8–10.8)

## 2016-03-10 LAB — URINALYSIS, MICROSCOPIC ONLY
Bacteria, UA: NONE SEEN [HPF]
CASTS: NONE SEEN [LPF]
Crystals: NONE SEEN [HPF]
RBC / HPF: NONE SEEN RBC/HPF (ref <=2)
Squamous Epithelial / LPF: NONE SEEN [HPF] (ref <=5)
YEAST: NONE SEEN [HPF]

## 2016-03-10 LAB — URINALYSIS, ROUTINE W REFLEX MICROSCOPIC
Bilirubin Urine: NEGATIVE
GLUCOSE, UA: NEGATIVE
HGB URINE DIPSTICK: NEGATIVE
Ketones, ur: NEGATIVE
Nitrite: NEGATIVE
PROTEIN: NEGATIVE
Specific Gravity, Urine: 1.009 (ref 1.001–1.035)
pH: 6 (ref 5.0–8.0)

## 2016-03-10 LAB — CLOSTRIDIUM DIFFICILE BY PCR: Toxigenic C. Difficile by PCR: NOT DETECTED

## 2016-03-17 NOTE — Telephone Encounter (Signed)
PT called for results. Michela Pitcher he is feeling a lot better.

## 2016-03-18 NOTE — Telephone Encounter (Signed)
Pt is aware.  

## 2016-03-18 NOTE — Telephone Encounter (Signed)
PLEASE CALL PT. HIS C DIFF TEST IS NEGATIVE. HIS LABS ARE NORMAL. HE DOES NOT HAVE A UTI.

## 2016-03-22 ENCOUNTER — Telehealth: Payer: Self-pay

## 2016-03-22 NOTE — Telephone Encounter (Signed)
Pt called and said he needs an order for his pull ups sent to [facility]. The drug store that had been supplying those has closed.

## 2016-03-25 MED ORDER — PROCARE ADULT BRIEFS LARGE MISC: Status: DC

## 2016-03-25 NOTE — Telephone Encounter (Signed)
Pt called back and would like his prescription sent to [facility] for his Pull ups.

## 2016-03-25 NOTE — Telephone Encounter (Signed)
PT is aware.

## 2016-03-25 NOTE — Telephone Encounter (Signed)
PLEASE CALL PT.  Rx sent.

## 2016-04-20 ENCOUNTER — Ambulatory Visit (INDEPENDENT_AMBULATORY_CARE_PROVIDER_SITE_OTHER): Payer: Medicaid Other | Admitting: Urology

## 2016-04-20 DIAGNOSIS — R3914 Feeling of incomplete bladder emptying: Secondary | ICD-10-CM | POA: Diagnosis not present

## 2016-04-20 DIAGNOSIS — N390 Urinary tract infection, site not specified: Secondary | ICD-10-CM

## 2016-04-20 DIAGNOSIS — N359 Urethral stricture, unspecified: Secondary | ICD-10-CM

## 2016-04-23 ENCOUNTER — Other Ambulatory Visit: Payer: Self-pay | Admitting: Urology

## 2016-04-23 DIAGNOSIS — N320 Bladder-neck obstruction: Secondary | ICD-10-CM

## 2016-04-27 ENCOUNTER — Ambulatory Visit (HOSPITAL_COMMUNITY)
Admission: RE | Admit: 2016-04-27 | Discharge: 2016-04-27 | Disposition: A | Discharge status: Home / Self Care | Payer: Medicaid Other | Source: Ambulatory Visit | Attending: Urology | Admitting: Urology

## 2016-04-27 DIAGNOSIS — N320 Bladder-neck obstruction: Secondary | ICD-10-CM | POA: Insufficient documentation

## 2016-06-02 ENCOUNTER — Encounter: Payer: Self-pay | Admitting: Gastroenterology

## 2016-06-08 ENCOUNTER — Other Ambulatory Visit: Payer: Self-pay | Admitting: Gastroenterology

## 2016-06-14 ENCOUNTER — Other Ambulatory Visit: Payer: Self-pay | Admitting: Gastroenterology

## 2016-06-14 MED ORDER — MERCAPTOPURINE 50 MG PO TABS: ORAL_TABLET | ORAL | 5 refills | Status: DC

## 2016-07-14 ENCOUNTER — Encounter: Payer: Self-pay | Admitting: Gastroenterology

## 2016-07-14 ENCOUNTER — Ambulatory Visit (INDEPENDENT_AMBULATORY_CARE_PROVIDER_SITE_OTHER): Payer: Medicaid Other | Admitting: Gastroenterology

## 2016-07-14 DIAGNOSIS — K760 Fatty (change of) liver, not elsewhere classified: Secondary | ICD-10-CM

## 2016-07-14 DIAGNOSIS — K50018 Crohn's disease of small intestine with other complication: Secondary | ICD-10-CM | POA: Diagnosis not present

## 2016-07-14 DIAGNOSIS — M8700 Idiopathic aseptic necrosis of unspecified bone: Secondary | ICD-10-CM | POA: Diagnosis not present

## 2016-07-14 NOTE — Assessment & Plan Note (Addendum)
SYMPTOMS CONTROLLED/RESOLVED.  COMPLETE LABS AND DEXA SCAN. CONTINUE MERCAPTOPURINE. CONTINUE SMOKING CESSATION EFFORT. COMPLETE THE PNEUMONIA SHOTS. IT IS A TWO SHOT SERIES. YOU NEED PREVNAR FIRST THEN IN 8 WEEKS THE PNEUMOVAX. COMPLETE THE BONE DENSITY SCAN. COMPLETE BLOOD DRAW FOR: VIT D 25-OH, CBC, CMP, HEPATITIS A, & VITAMIN B12. PLEASE CALL WITH QUESTIONS OR CONCERNS.  FOLLOW UP IN 4 MOS.

## 2016-07-14 NOTE — Assessment & Plan Note (Addendum)
DUE TO CHRONIC STEROID USE AND NOW OFF STEROIDS. CONTIUES TO SMOKE. BMI > 30.  COMPLETE DEXA SCAN. COMPLETE BLOOD DRAW FOR: VIT D 25-OH. PLEASE CALL WITH QUESTIONS OR CONCERNS.  FOLLOW UP IN 4 MOS.

## 2016-07-14 NOTE — Progress Notes (Signed)
Subjective:    Patient ID: [name], male    DOB: [birth year], 50 y.o.   MRN: [id number]  [name], MD  HPI SMOKING 1 PACK A DAY. WAS UP TO 3 PACKS A DAY. NO ASPIRIN, BC/GOODY POWDERS, IBUPROFEN/MOTRIN, OR NAPROXEN/ALEVE. GOT HIS FLU SHOT. CHANGING BAG 4-5 TIMES A DAY. BROWN STOOL.  PT DENIES FEVER, CHILLS, HEMATOCHEZIA, nausea, vomiting, melena, diarrhea, CHEST PAIN, SHORTNESS OF BREATH,  CHANGE IN BOWEL IN HABITS, constipation, abdominal pain, problems swallowing, OR heartburn or indigestion.   Past Medical History:  Diagnosis Date  . Arthritis   . Asthma   . 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 Bipolar disorder ([place])   . Colostomy in place [facility])   . Crohn's colitis ([place])    s/p total colectomy with ileostomy in 2009  . Crohn's disease of small intestine [facility]) Sept 2012   ileoscopy: multiple ulcers likely secondary to Crohn's  . DEEP VENOUS THROMBOPHLEBITIS, HX OF 02/11/2009   Qualifier: Diagnosis of  By: Zeb Comfort    . Depression   . DVT (deep venous thrombosis) ([place]) 2009   right upper extremity due to PICC  . Enterococcus UTI 2009  . GERD (gastroesophageal reflux disease)   . Hernia   . Hyperlipidemia   . Insomnia   . Migraine headache   . Nystagmus   . PULMONARY EMBOLISM, HX OF 02/11/2009   Qualifier: Diagnosis of  By: Zeb Comfort    . S/P endoscopy Sept 2012   mild gastritis, small hiatal hernia, no ulcers  . Schizophrenia ([place])   . Schizophrenia, acute [facility])    Past Surgical History:  Procedure Laterality Date  . APPENDECTOMY    . ESOPHAGOGASTRODUODENOSCOPY  07/2010   gastritis,  bx neg for H.Pylori  . ESOPHAGOGASTRODUODENOSCOPY N/A 01/08/2014   SLF:NO SOURCE FOR ODYNOPHAGIA IDENTIFIED/Multiple small ulcers in the gastric antrum  . EYE SURGERY    . ILEOSCOPY  06/29/2011   SLF: ulcers,multiple/small HH/mild gastritis  . KIDNEY SURGERY    . LAPAROTOMY N/A 06/05/2013   Procedure: EXPLORATORY  LAPAROTOMY;  Surgeon: Donato Heinz, MD;  Location: [facility];  Service: General;  Laterality: N/A;  . LYSIS OF ADHESION N/A 06/05/2013   Procedure: LYSIS OF ADHESIONS;  Surgeon: Donato Heinz, MD;  Location: [facility];  Service: General;  Laterality: N/A;  . small bowel capsule study  08/2010   few tiny nonbleeding erosions/ulcers ?secondary to relafen or Crohn's. Entire small bowel not seen.   Marland Kitchen TOTAL COLECTOMY  2009   with ileostomy at [facility] for refractory disease   . TOTAL HIP ARTHROPLASTY     right, due to avascular necrosis from chronic prednisone  . TOTAL SHOULDER REPLACEMENT     bilateral    Allergies  Allergen Reactions  . Aspirin     REACTION: unknown reaction  . Bactrim [Sulfamethoxazole-Trimethoprim]     ABD CRAMPS AND DIARRHEA  . Codeine     REACTION: unknown reaction  . Ibuprofen     REACTION: unknown reaction  . Penicillins     REACTION: unknown reaction  . Remicade [Infliximab]     COULDN'T BREATHE  . Tramadol Hcl     REACTION: unknown reaction  . Vicodin [Hydrocodone-Acetaminophen] Nausea Only    Current Outpatient Prescriptions  Medication Sig Dispense Refill  . TYLENOL 500 MG tablet Take 1,000 mg Q6H PRN    . albuterol inhaler Inhale 2 puffs Q6H PRN    .  Albuterol nebulizer solution Take 2.5 mg Q6H PRN     . atorvastatin (LIPITOR) 80 MG tablet Take 80 mg by mouth daily.    . benztropine (COGENTIN) 0.5 MG tablet Take 0.5 mg by mouth daily.     Marland Kitchen CALCIUM 600 + D PO Take 1 BID      . dicyclomine 10 MG capsule 1-2 PO QAC AND HS one time   . DEPAKOTE ER 250 MG 24 hr tablet Take 250 mg by mouth at bedtime.     . divalproex 500 MG 24 hr tablet Take 1,000 mg by mouth every evening.     . folic acid (FOLVITE) 1 MG tablet Take 1 mg by mouth daily.      . furosemide (LASIX) 40 MG tablet Take 40 mg by mouth daily.      Marland Kitchen NEURONTIN) 300 MG capsule Take 900 mg TID     . ADULT BRIEFS LARGE) MISC Use as directed Not using   . lidocaine (XYLOCAINE) 2 % solution 2 TSP   PO Q4-6H PRN FOR HEARTBURN OR UPPER ABDOMINAL PAIN    . mercaptopurine 50 MG tablet TAKE 2 TABLETS DAILY ON EMPTY STOMACH 1 HOUR BEFORE OR 2 HOURS AFTER M EAL    . GLUCOPHAGE 500 MG tablet Take BID with a meal.    . PROTONIX 40 MG tablet tWICE A DAY BEFORE MEALS.      KCL Take 20 mEq by mouth daily.     . risperiDONE (RISPERDAL) 2 MG tablet Take 2 mg by mouth at bedtime. 3.5 tabs qhs    . sertraline (ZOLOFT) 50 MG tablet Take 50 mg by mouth daily.    . Tamsulosin HCl (FLOMAX) 0.4 MG CAPS Take 0.4 mg by mouth daily.      . traZODone (DESYREL) 100 MG tablet Take 100 mg by mouth at bedtime.    .      . BACTRIM DS,SEPTRA DS 800-160 MG tablet Take 1 tablet by mouth 2 (two) times daily.      Review of Systems PER HPI OTHERWISE ALL SYSTEMS ARE NEGATIVE.    Objective:   Physical Exam  Constitutional: He is oriented to person, place, and time. He appears well-developed and well-nourished. No distress.  HENT:  Head: Normocephalic and atraumatic.  Mouth/Throat: Oropharynx is clear and moist. No oropharyngeal exudate.  Eyes: Pupils are equal, round, and reactive to light. No scleral icterus.  Neck: Normal range of motion. Neck supple.  Cardiovascular: Normal rate, regular rhythm and normal heart sounds.   Pulmonary/Chest: Effort normal and breath sounds normal. No respiratory distress.  Abdominal: Soft. Bowel sounds are normal. He exhibits no distension. There is no tenderness.  OSTOMY IN RLQ-BROWN STOOL  Musculoskeletal: He exhibits no edema.  Lymphadenopathy:    He has no cervical adenopathy.  Neurological: He is alert and oriented to person, place, and time.  NO  NEW FOCAL DEFICITS  Psychiatric: He has a normal mood and affect.  Vitals reviewed.     Assessment & Plan:

## 2016-07-14 NOTE — Patient Instructions (Addendum)
CONTINUE YOUR WEIGHT LOSS EFFORTS. LOSE TEN POUNDS.  TRY TO CUT YOUR CIGS PER DAY IN HALF.  COMPLETE LABS AND DEXA SCAN.  CONTINUE MERCAPTOPURINE.  COMPLETE THE PNEUMONIA SHOTS. IT IS A TWO SHOT SERIES. YOU NEED PREVNAR FIRST THEN IN 8 WEEKS THE PNEUMOVAX.  COMPLETE THE BONE DENSITY SCAN.  COMPLETE BLOOD DRAW FOR: VIT D 25-OH, CBC, CMP, HEPATITIS A, & VITAMIN B12.  PLEASE CALL WITH QUESTIONS OR CONCERNS.  FOLLOW UP IN 4 MOS.

## 2016-07-14 NOTE — Progress Notes (Signed)
Primary Care Physician:  [name], MD Primary Gastroenterologist:  Dr. Oneida Alar  Pre-Procedure History & Physical: HPI:  [name] is a 50 y.o. male here for   Past Medical History:  Diagnosis Date  . Arthritis   . Asthma   . 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 Bipolar disorder ([place])   . Colostomy in place [facility])   . Crohn's colitis ([place])    s/p total colectomy with ileostomy in 2009  . Crohn's disease of small intestine [facility]) Sept 2012   ileoscopy: multiple ulcers likely secondary to Crohn's  . DEEP VENOUS THROMBOPHLEBITIS, HX OF 02/11/2009   Qualifier: Diagnosis of  By: Zeb Comfort    . Depression   . DVT (deep venous thrombosis) ([place]) 2009   right upper extremity due to PICC  . Enterococcus UTI 2009  . GERD (gastroesophageal reflux disease)   . Hernia   . Hyperlipidemia   . Insomnia   . Migraine headache   . Nystagmus   . PULMONARY EMBOLISM, HX OF 02/11/2009   Qualifier: Diagnosis of  By: Zeb Comfort    . S/P endoscopy Sept 2012   mild gastritis, small hiatal hernia, no ulcers  . Schizophrenia ([place])   . Schizophrenia, acute [facility])     Past Surgical History:  Procedure Laterality Date  . APPENDECTOMY    . ESOPHAGOGASTRODUODENOSCOPY  07/2010   gastritis,  bx neg for H.Pylori  . ESOPHAGOGASTRODUODENOSCOPY N/A 01/08/2014   SLF:NO SOURCE FOR ODYNOPHAGIA IDENTIFIED/Multiple small ulcers in the gastric antrum  . EYE SURGERY    . ILEOSCOPY  06/29/2011   SLF: ulcers,multiple/small HH/mild gastritis  . KIDNEY SURGERY    . LAPAROTOMY N/A 06/05/2013   Procedure: EXPLORATORY LAPAROTOMY;  Surgeon: Donato Heinz, MD;  Location: [facility];  Service: General;  Laterality: N/A;  . LYSIS OF ADHESION N/A 06/05/2013   Procedure: LYSIS OF ADHESIONS;  Surgeon: Donato Heinz, MD;  Location: [facility];  Service: General;  Laterality: N/A;  . small bowel capsule study  08/2010   few tiny nonbleeding erosions/ulcers ?secondary to  relafen or Crohn's. Entire small bowel not seen.   Marland Kitchen TOTAL COLECTOMY  2009   with ileostomy at [facility] for refractory disease   . TOTAL HIP ARTHROPLASTY     right, due to avascular necrosis from chronic prednisone  . TOTAL SHOULDER REPLACEMENT     bilateral    Prior to Admission medications   Medication Sig Start Date End Date Taking? Authorizing Provider  acetaminophen (TYLENOL) 500 MG tablet Take 1,000 mg by mouth every 6 (six) hours as needed. For pain     Historical Provider, MD  albuterol (PROAIR HFA) 108 (90 BASE) MCG/ACT inhaler Inhale 2 puffs into the lungs every 6 (six) hours as needed for wheezing or shortness of breath.    Historical Provider, MD  albuterol (PROVENTIL) (2.5 MG/3ML) 0.083% nebulizer solution Take 2.5 mg by nebulization every 6 (six) hours as needed for wheezing or shortness of breath.    Historical Provider, MD  atorvastatin (LIPITOR) 80 MG tablet Take 80 mg by mouth daily.    Historical Provider, MD  benztropine (COGENTIN) 0.5 MG tablet Take 0.5 mg by mouth daily.     Historical Provider, MD  Calcium Carbonate-Vitamin D (CALCIUM 600 + D PO) Take 1 tablet by mouth 2 (two) times daily.     Historical Provider, MD  dicyclomine (BENTYL) 10 MG capsule 1-2 PO QAC AND HS  07/30/15   Danie Binder, MD  divalproex (DEPAKOTE ER) 250 MG 24 hr tablet Take 250 mg by mouth at bedtime.     Historical Provider, MD  divalproex (DEPAKOTE) 500 MG 24 hr tablet Take 1,000 mg by mouth every evening.     Historical Provider, MD  folic acid (FOLVITE) 1 MG tablet Take 1 mg by mouth daily.      Historical Provider, MD  furosemide (LASIX) 40 MG tablet Take 40 mg by mouth daily.      Historical Provider, MD  gabapentin (NEURONTIN) 300 MG capsule Take 900 mg by mouth 3 (three) times daily.     Historical Provider, MD  Incontinence Supply Disposable (PROCARE ADULT BRIEFS LARGE) MISC Use as directed 03/25/16   Danie Binder, MD  lidocaine (XYLOCAINE) 2 % solution 2 TSP  PO Q4-6H PRN FOR  HEARTBURN OR UPPER ABDOMINAL PAIN 02/05/16   Danie Binder, MD  mercaptopurine (PURINETHOL) 50 MG tablet TAKE 2 TABLETS DAILY ON EMPTY STOMACH 1 HOUR BEFORE OR 2 HOURS AFTER M EAL 06/14/16   Mahala Menghini, PA-C  metFORMIN (GLUCOPHAGE) 500 MG tablet Take by mouth 2 (two) times daily with a meal.    Historical Provider, MD  nitrofurantoin, macrocrystal-monohydrate, (MACROBID) 100 MG capsule Take 1 capsule (100 mg total) by mouth 2 (two) times daily. 10/17/14   Francine Graven, DO  pantoprazole (PROTONIX) 40 MG tablet TAKE (1) TABLET BY MOUTH TWICE A DAY BEFORE MEALS. (BREAKFAST AND SUPPER) 06/14/16   Mahala Menghini, PA-C  potassium chloride SA (K-DUR,KLOR-CON) 20 MEQ tablet Take 20 mEq by mouth daily.     Historical Provider, MD  risperiDONE (RISPERDAL) 2 MG tablet Take 2 mg by mouth at bedtime. 3.5 tabs qhs    Historical Provider, MD  sertraline (ZOLOFT) 50 MG tablet Take 50 mg by mouth daily.    Historical Provider, MD  sulfamethoxazole-trimethoprim (BACTRIM DS,SEPTRA DS) 800-160 MG tablet Take 1 tablet by mouth 2 (two) times daily. Reported on 02/04/2016    Historical Provider, MD  Tamsulosin HCl (FLOMAX) 0.4 MG CAPS Take 0.4 mg by mouth daily.      Historical Provider, MD  traZODone (DESYREL) 100 MG tablet Take 100 mg by mouth at bedtime.    Historical Provider, MD    Allergies as of 07/14/2016 - Review Complete 02/04/2016  Allergen Reaction Noted  . Aspirin    . Bactrim [sulfamethoxazole-trimethoprim]  11/07/2013  . Codeine    . Ibuprofen    . Penicillins    . Remicade [infliximab]  04/18/2013  . Tramadol hcl    . Vicodin [hydrocodone-acetaminophen] Nausea Only 05/17/2011    No family history on file.  Social History   Social History  . Marital status: Single    Spouse name: N/A  . Number of children: N/A  . Years of education: N/A   Occupational History  . Not on file.   Social History Main Topics  . Smoking status: Current Every Day Smoker    Packs/day: 1.00    Types:  Cigarettes  . Smokeless tobacco: Not on file     Comment: one pack daily  . Alcohol use No  . Drug use: No  . Sexual activity: No   Other Topics Concern  . Not on file   Social History Narrative   Girlfriend of six years, Rectal Cancer on Hospice (05/2011).    Review of Systems: See HPI, otherwise negative ROS   Physical Exam: There were no vitals taken for this visit.  General:   Alert,  pleasant and cooperative in NAD Head:  Normocephalic and atraumatic. Neck:  Supple; Lungs:  Clear throughout to auscultation.    Heart:  Regular rate and rhythm. Abdomen:  Soft, nontender and nondistended. Normal bowel sounds, without guarding, and without rebound.   Neurologic:  Alert and  oriented x4;  grossly normal neurologically.  Impression/Plan:

## 2016-07-14 NOTE — Progress Notes (Signed)
cc'ed to pcp °

## 2016-07-14 NOTE — Assessment & Plan Note (Signed)
DUE TO BMI > 30 AND VALPROIC ACID.  HFP Q6 MOS. CONTINUE WEIGHT LOSS EFFORTS. FOLLOW UP IN 4 MOS.

## 2016-07-14 NOTE — Progress Notes (Signed)
ON RECALL  °

## 2016-07-15 ENCOUNTER — Ambulatory Visit: Payer: Medicaid Other | Admitting: Gastroenterology

## 2016-07-16 ENCOUNTER — Ambulatory Visit: Payer: Medicaid Other | Admitting: Orthopedic Surgery

## 2016-07-22 ENCOUNTER — Ambulatory Visit (HOSPITAL_COMMUNITY)
Admission: RE | Admit: 2016-07-22 | Discharge: 2016-07-22 | Disposition: A | Discharge status: Home / Self Care | Payer: Medicaid Other | Source: Ambulatory Visit | Attending: Gastroenterology | Admitting: Gastroenterology

## 2016-07-22 DIAGNOSIS — M85832 Other specified disorders of bone density and structure, left forearm: Secondary | ICD-10-CM | POA: Diagnosis not present

## 2016-07-22 DIAGNOSIS — Z720 Tobacco use: Secondary | ICD-10-CM | POA: Diagnosis not present

## 2016-07-22 DIAGNOSIS — M85852 Other specified disorders of bone density and structure, left thigh: Secondary | ICD-10-CM | POA: Insufficient documentation

## 2016-07-22 DIAGNOSIS — M8700 Idiopathic aseptic necrosis of unspecified bone: Secondary | ICD-10-CM | POA: Diagnosis present

## 2016-07-26 ENCOUNTER — Ambulatory Visit: Payer: Medicaid Other | Admitting: Orthopedic Surgery

## 2016-08-02 ENCOUNTER — Ambulatory Visit: Payer: Medicaid Other | Admitting: Orthopedic Surgery

## 2016-08-04 ENCOUNTER — Telehealth: Payer: Self-pay | Admitting: Gastroenterology

## 2016-08-04 NOTE — Telephone Encounter (Signed)
[place] INQUIRING ABOUT HIS BONE DENSITY DRESULTS

## 2016-08-04 NOTE — Telephone Encounter (Signed)
Forwarding to Dr. Oneida Alar for results.

## 2016-08-09 NOTE — Telephone Encounter (Signed)
PLEASE CALL PT. HIS BONE DENSITY STUDY SHOWS THIN BONES BUT HE DOESN'T HAVE OSTEOPOROSIS. HE NEEDS TO COMPLETE HIS BLOOD DRAW TO CHECK VIT D 25-OH, CBC, CMP, HEPATITIS A, & VITAMIN B12.   CONTINUE YOUR WEIGHT LOSS EFFORTS. LOSE TEN POUNDS.  TRY TO CUT YOUR CIGS PER DAY IN HALF.  COMPLETE THE PNEUMONIA SHOTS. IT IS A TWO SHOT SERIES. YOU NEED PREVNAR FIRST THEN IN 8 WEEKS THE PNEUMOVAX.

## 2016-08-12 NOTE — Telephone Encounter (Signed)
Pt is aware of results and will try to do the labs soon!

## 2016-08-25 ENCOUNTER — Other Ambulatory Visit: Payer: Self-pay | Admitting: Gastroenterology

## 2016-08-31 ENCOUNTER — Telehealth: Payer: Self-pay

## 2016-08-31 NOTE — Telephone Encounter (Signed)
Pt is aware Rx has been sent in.

## 2016-08-31 NOTE — Telephone Encounter (Signed)
Pt called to see if we had gotten a refill request for his Bentyl. I told him it is in the refill box and I will let them know he has called for it. He has been without for about a week.

## 2016-09-02 ENCOUNTER — Other Ambulatory Visit: Payer: Self-pay | Admitting: Gastroenterology

## 2016-09-02 LAB — CBC WITH DIFFERENTIAL/PLATELET
BASOS PCT: 0 %
Basophils Absolute: 0 cells/uL (ref 0–200)
EOS PCT: 2 %
Eosinophils Absolute: 98 cells/uL (ref 15–500)
HCT: 46.2 % (ref 38.5–50.0)
Hemoglobin: 16.3 g/dL (ref 13.2–17.1)
LYMPHS PCT: 34 %
Lymphs Abs: 1666 cells/uL (ref 850–3900)
MCH: 37 pg — ABNORMAL HIGH (ref 27.0–33.0)
MCHC: 35.3 g/dL (ref 32.0–36.0)
MCV: 105 fL — AB (ref 80.0–100.0)
MONOS PCT: 6 %
MPV: 10.2 fL (ref 7.5–12.5)
Monocytes Absolute: 294 cells/uL (ref 200–950)
NEUTROS ABS: 2842 {cells}/uL (ref 1500–7800)
Neutrophils Relative %: 58 %
PLATELETS: 171 10*3/uL (ref 140–400)
RBC: 4.4 MIL/uL (ref 4.20–5.80)
RDW: 14.8 % (ref 11.0–15.0)
WBC: 4.9 10*3/uL (ref 3.8–10.8)

## 2016-09-02 LAB — COMPLETE METABOLIC PANEL WITH GFR
ALBUMIN: 4.5 g/dL (ref 3.6–5.1)
ALK PHOS: 49 U/L (ref 40–115)
ALT: 16 U/L (ref 9–46)
AST: 15 U/L (ref 10–35)
BILIRUBIN TOTAL: 0.9 mg/dL (ref 0.2–1.2)
BUN: 12 mg/dL (ref 7–25)
CALCIUM: 9.8 mg/dL (ref 8.6–10.3)
CO2: 28 mmol/L (ref 20–31)
CREATININE: 1.11 mg/dL (ref 0.70–1.33)
Chloride: 105 mmol/L (ref 98–110)
GFR, EST AFRICAN AMERICAN: 88 mL/min (ref >=60)
GFR, EST NON AFRICAN AMERICAN: 76 mL/min (ref >=60)
Glucose, Bld: 86 mg/dL (ref 65–99)
Potassium: 4.6 mmol/L (ref 3.5–5.3)
Sodium: 140 mmol/L (ref 135–146)
TOTAL PROTEIN: 6.8 g/dL (ref 6.1–8.1)

## 2016-09-03 ENCOUNTER — Telehealth: Payer: Self-pay | Admitting: Gastroenterology

## 2016-09-03 LAB — VITAMIN B12: Vitamin B-12: 435 pg/mL (ref 200–1100)

## 2016-09-03 LAB — HEPATITIS A ANTIBODY, TOTAL: HEP A TOTAL AB: NONREACTIVE

## 2016-09-03 LAB — VITAMIN D 25 HYDROXY (VIT D DEFICIENCY, FRACTURES): Vit D, 25-Hydroxy: 57 ng/mL (ref 30–100)

## 2016-09-03 NOTE — Telephone Encounter (Addendum)
PLEASE CALL PT. HIS VITAMIN D AND B 12 LEVEL ARE NORMAL. HIS LIVER PANEL AND BLOOD COUNT ARE NORMAL. HE IS NOT IMMUNE TO HEPATITIS A. HE SHOULD GET THE VACCINE FROM HIS PCP.

## 2016-09-03 NOTE — Telephone Encounter (Signed)
PT is aware and would like this note and all of the labs sent to PCP at [facility] to [place] to do so.

## 2016-09-03 NOTE — Telephone Encounter (Signed)
done

## 2016-09-03 NOTE — Telephone Encounter (Signed)
LMOM to call.

## 2016-09-06 ENCOUNTER — Telehealth: Payer: Self-pay | Admitting: Gastroenterology

## 2016-09-06 NOTE — Telephone Encounter (Signed)
Pt called to say that he was out of colostomy bags.

## 2016-09-07 NOTE — Telephone Encounter (Signed)
Forwarding to refill box. ( Please see previous Rx sent in June 2017).

## 2016-09-07 NOTE — Telephone Encounter (Signed)
I spoke to [place] at [facility] and she said he has never gotten the colostomy bags there, just the diapers.  I will check with pt.

## 2016-09-07 NOTE — Telephone Encounter (Signed)
Pt called and left another VM. He needs Rx sent to [facility] for colostomy bags. HAS ONLY ONE LEFT.

## 2016-09-07 NOTE — Telephone Encounter (Signed)
Authorize for 3 mos supply, refill X 1 year

## 2016-09-08 NOTE — Telephone Encounter (Signed)
Per pt the company that he was getting colostomy bags from has went out of business.  Per Tammy at [facility], they phased out the colostomy bags, they could not find a product that they could bill insurance on.

## 2016-09-08 NOTE — Telephone Encounter (Signed)
I called pt and LMOM that I have faxed the order to [facility] and if he does not hear from them to give me a call.

## 2016-09-08 NOTE — Telephone Encounter (Signed)
I have faxed the Rx and the insurance card info to [name].

## 2016-09-08 NOTE — Telephone Encounter (Signed)
[facility] cannot deliver that far, they can probably mail.  They need a prescription faxed over with the insurance info. Per Pt he has been receiving [employer]  Size 3/4 in x 2.5 in. Res [phone]. Dr. Oneida Alar has already approved for one year.

## 2016-09-08 NOTE — Telephone Encounter (Signed)
Per Lynelle Smoke, they referred pt's to a mail order co in [place] called [facility]  # [phone].  I will check with [facility] first.

## 2016-09-09 NOTE — Telephone Encounter (Signed)
I received a VM from Judson Roch at [place], [phone]). She needs pt's address since it was not readable on the paperwork faxed. I called and got Vm and left the address on machine.

## 2016-09-29 ENCOUNTER — Encounter: Payer: Self-pay | Admitting: Gastroenterology

## 2016-10-18 ENCOUNTER — Telehealth: Payer: Self-pay

## 2016-10-18 NOTE — Telephone Encounter (Signed)
Pt called and said he called [facility] last week a couple of days to order his colostomy bags.  He was told they had been sent, but he has not received them. Michela Pitcher he is on his last bag. I told him to call them and see if they were sent and if not to see if he could get a ride to go pick them up.  He said that he would.

## 2016-10-22 NOTE — Telephone Encounter (Signed)
Called [facility] (DAVID)TO DISCUSS. COLOSTOMY MAILED UPS/SENT OUT-JAN 12. PT CALLED ON JAN 15. [facility] WILL CALL ME IF DELIVERY NOT CONFIRMED.

## 2016-11-10 ENCOUNTER — Ambulatory Visit: Payer: Medicaid Other | Admitting: Gastroenterology

## 2016-11-10 ENCOUNTER — Telehealth: Payer: Self-pay | Admitting: Gastroenterology

## 2016-11-10 NOTE — Progress Notes (Deleted)
   Subjective:    Patient ID: [name], male    DOB: 07/07/1966, 51 y.o.   MRN: [id number]  HPI    Review of Systems     Objective:   Physical Exam        Assessment & Plan:

## 2016-11-10 NOTE — Telephone Encounter (Signed)
Pt was a no show

## 2016-11-10 NOTE — Telephone Encounter (Signed)
CONTACT PT TO [facility]

## 2016-11-18 ENCOUNTER — Other Ambulatory Visit: Payer: Self-pay | Admitting: Gastroenterology

## 2016-11-23 ENCOUNTER — Other Ambulatory Visit (HOSPITAL_COMMUNITY)
Admission: AD | Admit: 2016-11-23 | Discharge: 2016-11-23 | Disposition: A | Discharge status: Home / Self Care | Payer: Medicaid Other | Source: Skilled Nursing Facility | Attending: Gastroenterology | Admitting: Gastroenterology

## 2016-11-23 ENCOUNTER — Ambulatory Visit (INDEPENDENT_AMBULATORY_CARE_PROVIDER_SITE_OTHER): Payer: Medicaid Other | Admitting: Urology

## 2016-11-23 ENCOUNTER — Ambulatory Visit: Payer: Medicaid Other | Admitting: Urology

## 2016-11-23 DIAGNOSIS — N3000 Acute cystitis without hematuria: Secondary | ICD-10-CM | POA: Insufficient documentation

## 2016-11-23 DIAGNOSIS — R3914 Feeling of incomplete bladder emptying: Secondary | ICD-10-CM | POA: Diagnosis not present

## 2016-11-23 DIAGNOSIS — N390 Urinary tract infection, site not specified: Secondary | ICD-10-CM | POA: Diagnosis not present

## 2016-11-25 LAB — URINE CULTURE: Culture: >=100000 — AB

## 2016-12-15 ENCOUNTER — Encounter: Payer: Self-pay | Admitting: Gastroenterology

## 2016-12-15 ENCOUNTER — Ambulatory Visit (INDEPENDENT_AMBULATORY_CARE_PROVIDER_SITE_OTHER): Payer: Medicaid Other | Admitting: Gastroenterology

## 2016-12-15 DIAGNOSIS — K50018 Crohn's disease of small intestine with other complication: Secondary | ICD-10-CM | POA: Diagnosis not present

## 2016-12-15 DIAGNOSIS — K760 Fatty (change of) liver, not elsewhere classified: Secondary | ICD-10-CM

## 2016-12-15 DIAGNOSIS — K219 Gastro-esophageal reflux disease without esophagitis: Secondary | ICD-10-CM

## 2016-12-15 NOTE — Assessment & Plan Note (Signed)
WEIGHT UP. BEEN COOKING OLD SCHOLL MEALS.  CONTINUE YOUR WEIGHT LOSS EFFORTS. FOLLOW UP IN 6 MOS.

## 2016-12-15 NOTE — Patient Instructions (Addendum)
COMPLETE YOUR HEPATITIS A VACCINE.   CUT DOWN ON THE SMOKING.  AVOID REFLUX TRIGGERS. SEE INFO BELOW.  CONTINUE PROTONIX.  FOLLOW UP IN 6 MOS.  YOU WILL NEED CBC/CMP 1 WEEK PRIOR TO YOUR VISIT.    Lifestyle and home remedies TO CONTROL HEARTBURN  You may eliminate or reduce the frequency of heartburn by making the following lifestyle changes:  . Control your weight. Being overweight is a major risk factor for heartburn and GERD. Excess pounds put pressure on your abdomen, pushing up your stomach and causing acid to back up into your esophagus.   . Eat smaller meals. 4 TO 6 MEALS A DAY. This reduces pressure on the lower esophageal sphincter, helping to prevent the valve from opening and acid from washing back into your esophagus.   Dolphus Jenny your belt. Clothes that fit tightly around your waist put pressure on your abdomen and the lower esophageal sphincter.   . Eliminate heartburn triggers. Everyone has specific triggers. Common triggers such as fatty or fried foods, spicy food, tomato sauce, carbonated beverages, alcohol, chocolate, mint, garlic, onion, caffeine and nicotine may make heartburn worse.   Marland Kitchen Avoid stooping or bending. Tying your shoes is OK. Bending over for longer periods to weed your garden isn't, especially soon after eating.   . Don't lie down after a meal. Wait at least three to four hours after eating before going to bed, and don't lie down right after eating.    Alternative medicine . Several home remedies exist for treating GERD, but they provide only temporary relief. They include drinking baking soda (sodium bicarbonate) added to water or drinking other fluids such as baking soda mixed with cream of tartar and water.  . Although these liquids create temporary relief by neutralizing, washing away or buffering acids, eventually they aggravate the situation by adding gas and fluid to your stomach, increasing pressure and causing more acid reflux. Further, adding  more sodium to your diet may increase your blood pressure and add stress to your heart, and excessive bicarbonate ingestion can alter the acid-base balance in your body.

## 2016-12-15 NOTE — Assessment & Plan Note (Addendum)
SYMPTOMS NOT IDEALLY CONTROLLED DUE TO CAFFEINE AND WEIGHT GAIN,  LOSE WEIGHT AVOID TRIGGERS CONTINUE [place]. FOLLOW UP IN 6 MOS.

## 2016-12-15 NOTE — Assessment & Plan Note (Addendum)
SYMPTOMS FAIRLY WELL CONTROLLED.  COMPLETE  HEPATITIS A VACCINE.  CUT DOWN ON SMOKING. FOLLOW UP IN 6 MOS.  CBC/CMP 1 WEEK PRIOR TO VISIT

## 2016-12-15 NOTE — Progress Notes (Signed)
ON RECALL  °

## 2016-12-15 NOTE — Progress Notes (Addendum)
Subjective:    Patient ID: [name], male    DOB: [birth year], 51 y.o.   MRN: [id number]  [name], MD  HPI BOWELS GOOD. CAN HAVE INTENSE ABDOMINAL PAIN(CRAMPY, LUQ, NO RADIATION)LASTS ABOUT 20 MINS, TRIGGERS: NONE, HAPPENS ONCE A WEEK, BETTER A LITTLE WITH HEATING PAD). JUST GOT OVER 2 UTIs. SEES UROLOGY IN AUG. OFF BACTRIM AND ON MACROBID. EMPTIES BAG 4-5 TIMES A DAY. NL STOOL. HEARTBURN: ONCE A DAY AND ESPECIALLY AT NIGHT. TRIGGERS; NO CAFFEINE 4 CUPS OF COFFEE AND 2 GLASSES OF TEA. GAINING WEIGHT. DIARRHEA IF HE DRINKS TOO MUCH COFFEE WITHOUT EATING. COMPETED PNEUMONIA VACCINE. GOT FLU SHOT BUT STILL GO THE FLU.  PT DENIES FEVER, CHILLS, HEMATOCHEZIA, HEMATEMESIS, nausea, vomiting, melena, diarrhea, CHEST PAIN, SHORTNESS OF BREATH, COUGH, CHANGE IN BOWEL IN HABITS, constipation, OR problems swallowing.  Past Medical History:  Diagnosis Date  . Arthritis   . Asthma   . 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 Bipolar disorder ([place])   . Colostomy in place [facility])   . Crohn's colitis ([place])    s/p total colectomy with ileostomy in 2009  . Crohn's disease of small intestine [facility]) Sept 2012   ileoscopy: multiple ulcers likely secondary to Crohn's  . DEEP VENOUS THROMBOPHLEBITIS, HX OF 02/11/2009   Qualifier: Diagnosis of  By: Zeb Comfort    . Depression   . DVT (deep venous thrombosis) ([place]) 2009   right upper extremity due to PICC  . Enterococcus UTI 2009  . GERD (gastroesophageal reflux disease)   . Hernia   . Hyperlipidemia   . Insomnia   . Migraine headache   . Nystagmus   . PULMONARY EMBOLISM, HX OF 02/11/2009   Qualifier: Diagnosis of  By: Zeb Comfort    . S/P endoscopy Sept 2012   mild gastritis, small hiatal hernia, no ulcers  . Schizophrenia ([place])   . Schizophrenia, acute [facility])     Past Surgical History:  Procedure Laterality Date  . APPENDECTOMY    . ESOPHAGOGASTRODUODENOSCOPY  07/2010   gastritis,   bx neg for H.Pylori  . ESOPHAGOGASTRODUODENOSCOPY N/A 01/08/2014   SLF:NO SOURCE FOR ODYNOPHAGIA IDENTIFIED/Multiple small ulcers in the gastric antrum  . EYE SURGERY    . ILEOSCOPY  06/29/2011   SLF: ulcers,multiple/small HH/mild gastritis  . KIDNEY SURGERY    . LAPAROTOMY N/A 06/05/2013   Procedure: EXPLORATORY LAPAROTOMY;  Surgeon: Donato Heinz, MD;  Location: [facility];  Service: General;  Laterality: N/A;  . LYSIS OF ADHESION N/A 06/05/2013   Procedure: LYSIS OF ADHESIONS;  Surgeon: Donato Heinz, MD;  Location: [facility];  Service: General;  Laterality: N/A;  . small bowel capsule study  08/2010   few tiny nonbleeding erosions/ulcers ?secondary to relafen or Crohn's. Entire small bowel not seen.   Marland Kitchen TOTAL COLECTOMY  2009   with ileostomy at [facility] for refractory disease   . TOTAL HIP ARTHROPLASTY     right, due to avascular necrosis from chronic prednisone  . TOTAL SHOULDER REPLACEMENT     bilateral    Allergies  Allergen Reactions  . Aspirin     REACTION: unknown reaction  . Bactrim [Sulfamethoxazole-Trimethoprim]     ABD CRAMPS AND DIARRHEA  . Codeine     REACTION: unknown reaction  . Ibuprofen     REACTION: unknown reaction  . Penicillins     REACTION: unknown reaction  . Remicade [Infliximab]     COULDN'T  BREATHE  . Tramadol Hcl     REACTION: unknown reaction  . Vicodin [Hydrocodone-Acetaminophen] Nausea Only    Current Outpatient Prescriptions  Medication Sig Dispense Refill  . acetaminophen (TYLENOL) 500 MG tablet Take 1,000 mg by mouth every 6 (six) hours as needed. For pain     . albuterol (PROAIR HFA) 108 (90 BASE) MCG/ACT inhaler Inhale 2 puffs into the lungs every 6 (six) hours as needed for wheezing or shortness of breath.    Marland Kitchen albuterol (PROVENTIL) (2.5 MG/3ML) 0.083% nebulizer solution Take 2.5 mg by nebulization every 6 (six) hours as needed for wheezing or shortness of breath.    Marland Kitchen atorvastatin (LIPITOR) 80 MG tablet Take 80 mg by mouth daily.    .  benztropine (COGENTIN) 0.5 MG tablet Take 0.5 mg by mouth daily.     . Calcium Carbonate-Vitamin D (CALCIUM 600 + D PO) Take 1 tablet by mouth 2 (two) times daily.     Marland Kitchen dicyclomine (BENTYL) 10 MG capsule TAKE 1 OR 2 CAPSULES AS DIRECTED BEFORE MEALS AND AT BEDTIME.    . divalproex (DEPAKOTE ER) 250 MG 24 hr tablet Take 250 mg by mouth at bedtime.     . divalproex (DEPAKOTE) 500 MG 24 hr tablet Take 1,000 mg by mouth every evening.     . folic acid (FOLVITE) 1 MG tablet Take 1 mg by mouth daily.      . furosemide (LASIX) 40 MG tablet Take 40 mg by mouth daily.      Marland Kitchen gabapentin (NEURONTIN) 300 MG capsule Take 900 mg by mouth 3 (three) times daily.     . Incontinence Supply Disposable (PROCARE ADULT BRIEFS LARGE) MISC Use as directed    . lidocaine (XYLOCAINE) 2 % solution 2 TSP  PO Q4-6H PRN FOR HEARTBURN OR UPPER ABDOMINAL PAIN    . mercaptopurine (PURINETHOL) 50 MG tablet TAKE 2 TABLETS DAILY ON EMPTY STOMACH 1 HOUR BEFORE OR 2 HOURS AFTER M EAL    . metFORMIN (GLUCOPHAGE) 500 MG tablet Take by mouth 2 (two) times daily with a meal.    . pantoprazole (PROTONIX) 40 MG tablet     . potassium chloride SA (K-DUR,KLOR-CON) 20 MEQ tablet Take 20 mEq by mouth daily.     . risperiDONE (RISPERDAL) 2 MG tablet Take 2 mg by mouth at bedtime. 3.5 tabs qhs    . sertraline (ZOLOFT) 50 MG tablet Take 50 mg by mouth daily.    . Tamsulosin HCl (FLOMAX) 0.4 MG CAPS Take 0.4 mg by mouth daily.      . traZODone (DESYREL) 100 MG tablet Take 100 mg by mouth at bedtime.    . nitrofurantoin, macrocrystal-monohydrate, (MACROBID) 100 MG capsule Take 1 capsule (100 mg total) by mouth 2 (two) times daily.     .       Review of Systems PER HPI OTHERWISE ALL SYSTEMS ARE NEGATIVE.    Objective:   Physical Exam  Constitutional: He is oriented to person, place, and time. He appears well-developed and well-nourished. No distress.  HENT:  Head: Normocephalic and atraumatic.  Mouth/Throat: Oropharynx is clear and moist.  No oropharyngeal exudate.  Eyes: Pupils are equal, round, and reactive to light. No scleral icterus.  Neck: Normal range of motion. Neck supple.  Cardiovascular: Normal rate, regular rhythm and normal heart sounds.   Pulmonary/Chest: Effort normal and breath sounds normal. No respiratory distress.  Abdominal: Soft. Bowel sounds are normal. He exhibits no distension. There is tenderness. There is no rebound and  no guarding.  MILD BLQs TTP and LUQ  Musculoskeletal: He exhibits no edema.  Lymphadenopathy:    He has no cervical adenopathy.  Neurological: He is alert and oriented to person, place, and time.  NO  NEW FOCAL DEFICITS  Psychiatric: He has a normal mood and affect.  Vitals reviewed.     Assessment & Plan:

## 2016-12-16 NOTE — Progress Notes (Signed)
CC'ED TO PCP 

## 2016-12-27 ENCOUNTER — Telehealth: Payer: Self-pay | Admitting: General Practice

## 2016-12-27 NOTE — Telephone Encounter (Signed)
Patient called and wanted to know if [facility] could order his under pad for his bed.  Routing to Dr. Oneida Alar

## 2016-12-29 NOTE — Telephone Encounter (Signed)
RX WRITTEN. FAX TO [facility].

## 2016-12-29 NOTE — Telephone Encounter (Signed)
Faxed to [facility]. Pt is aware.

## 2017-01-03 NOTE — Telephone Encounter (Signed)
Pt left Vm that [facility] said they did not get the order for the underpads. I had faxed the order on 12/29/2016. I called and spoke to Judson Roch, and confirmed the fax was received and I called and informed pt.

## 2017-03-10 ENCOUNTER — Other Ambulatory Visit: Payer: Self-pay | Admitting: Gastroenterology

## 2017-03-22 ENCOUNTER — Ambulatory Visit: Payer: Medicaid Other | Admitting: Urology

## 2017-04-07 ENCOUNTER — Telehealth: Payer: Self-pay

## 2017-04-07 NOTE — Telephone Encounter (Signed)
Pt called c/o of abdominal pain ( his entire stomach) with abdominal cramps for the last 2 days.  Said he is emptying his bag more than 6 times a day. No blood in stool. Nausea but no vomiting. Please advise!

## 2017-04-08 NOTE — Telephone Encounter (Signed)
Pt is aware.  

## 2017-04-08 NOTE — Telephone Encounter (Signed)
PLEASE CALL PT. HE NEEDS TO SEE HIS PCP TO HAVE HIS URINE CHECKED AND MAKE SURE HE DOES NOT HAVE A UTI. DRINK WATER TO KEEP YOUR URINE LIGHT YELLOW. AVOID DAIRY AND HIGH FAT MEALS. CALL ON TUES IF HIS SYMPTOMS ARE NOT BETTER. IF HE IS HAVING LOOSE STOOL/WATERY STOOL IT MAY BE JUST A VIRAL ILLNESS. HE CAN USE IMODIUM 1-2 TIMES  A DAY TO CONTROL LOOSE STOOLS/WATERY STOOLS.

## 2017-05-06 ENCOUNTER — Other Ambulatory Visit: Payer: Self-pay | Admitting: Gastroenterology

## 2017-05-06 ENCOUNTER — Other Ambulatory Visit: Payer: Self-pay | Admitting: Nurse Practitioner

## 2017-05-11 ENCOUNTER — Encounter: Payer: Self-pay | Admitting: Gastroenterology

## 2017-05-12 ENCOUNTER — Telehealth: Payer: Self-pay | Admitting: Gastroenterology

## 2017-05-12 NOTE — Telephone Encounter (Signed)
We received in the mail yesterday forms from [employer] for SF to sign. It's regarding urinary incontinence and the need for adult diapers. DS is aware and I have put the forms in [facility] office chair.

## 2017-05-26 NOTE — Telephone Encounter (Signed)
REVIEWED-NO ADDITIONAL RECOMMENDATIONS. 

## 2017-05-26 NOTE — Telephone Encounter (Signed)
Paperwork was for diapers for urinary incontinence. Per Dr. Oneida Alar, his PCP should handle those. I called Wells Guiles at [facility] and she is aware and said to shred this paperwork and they will send to PCP.

## 2017-05-26 NOTE — Telephone Encounter (Signed)
NEEDS PCP OR UROLOGY TO REFILL DIAPERS

## 2017-07-01 ENCOUNTER — Other Ambulatory Visit: Payer: Self-pay | Admitting: Nurse Practitioner

## 2017-07-13 ENCOUNTER — Ambulatory Visit (INDEPENDENT_AMBULATORY_CARE_PROVIDER_SITE_OTHER): Payer: Medicaid Other | Admitting: Gastroenterology

## 2017-07-13 ENCOUNTER — Encounter: Payer: Self-pay | Admitting: Gastroenterology

## 2017-07-13 DIAGNOSIS — K50018 Crohn's disease of small intestine with other complication: Secondary | ICD-10-CM

## 2017-07-13 MED ORDER — LIDOCAINE VISCOUS 2 % MT SOLN: OROMUCOSAL | 11 refills | Status: DC

## 2017-07-13 MED ORDER — CALCIUM CARBONATE-VITAMIN D 600-400 MG-UNIT PO TABS: ORAL_TABLET | ORAL | 11 refills | Status: DC

## 2017-07-13 NOTE — Progress Notes (Signed)
CC'D TO PCP °

## 2017-07-13 NOTE — Patient Instructions (Addendum)
COMPLETE HEPATIS A/B VACCINE. IT IS A THREE SHOT SERIES.  COMPLETE FLU SHOT EVERY YEAR.  CONTINUE YOUR EFFORTS TO QUIT SMOKING ALL TOGETHER.  COMPLETE LABS.  FOLLOW UP IN 6 MOS. MERRY CHRISTMAS AND HAPPY NEW YEAR!

## 2017-07-13 NOTE — Assessment & Plan Note (Signed)
SYMPTOMS CONTROLLED/RESOLVED AS PT CUT DOWN ON SMOKING. B12 /VIT D UTD.  COMPLETE HEPATIS A/B VACCINE. IT IS A THREE SHOT SERIES. COMPLETE FLU SHOT EVERY YEAR. CONTINUE YOUR EFFORTS TO QUIT SMOKING ALL TOGETHER. COMPLETE LABS: CBC/CMP. FOLLOW UP IN 6 MOS. MERRY CHRISTMAS AND HAPPY NEW YEAR!

## 2017-07-13 NOTE — Progress Notes (Signed)
cc'ed to pcp °
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
## 2017-07-26 ENCOUNTER — Ambulatory Visit: Payer: Medicaid Other | Admitting: Urology

## 2017-10-20 ENCOUNTER — Other Ambulatory Visit: Payer: Self-pay

## 2017-10-20 ENCOUNTER — Telehealth: Payer: Self-pay

## 2017-10-20 DIAGNOSIS — R109 Unspecified abdominal pain: Secondary | ICD-10-CM

## 2017-10-20 NOTE — Telephone Encounter (Signed)
Pt called and complains of abdominal cramps for the past 2 days. Said it doesn't matter what he eats, he has cramps and sometimes he almost doubles over. He has had some nausea but no vomiting. No fever and no diarrhea. Please advise!

## 2017-10-20 NOTE — Telephone Encounter (Signed)
Lab order faxed to lab at [facility]. Please enter orders for the X-ray.

## 2017-10-20 NOTE — Telephone Encounter (Signed)
PT is aware. Michela Pitcher he will have to schedule ride and it might be 4 days before he can get the x-ray and labs.

## 2017-10-20 NOTE — Telephone Encounter (Signed)
PLEASE CALL PT. He should see his PCP FOR A UA.

## 2017-10-20 NOTE — Telephone Encounter (Signed)
Pt said he was seen at PCP on Monday and did not have a UTI.  Please advise!

## 2017-10-20 NOTE — Telephone Encounter (Signed)
REVIEWED-NO ADDITIONAL RECOMMENDATIONS. 

## 2017-10-20 NOTE — Telephone Encounter (Signed)
PLEASE CALL PT. If he is having abdominal cramps he should use Bentyl bid for 3 days. I reviewed the labs that were just sent to me and his liver enzymes are normal. He should get a lipase and an acute abdominal series, Dx: abdominal pain/nausea.

## 2017-10-20 NOTE — Telephone Encounter (Signed)
LMOM for a return call.  

## 2017-10-26 ENCOUNTER — Other Ambulatory Visit: Payer: Self-pay | Admitting: Nurse Practitioner

## 2017-10-27 ENCOUNTER — Other Ambulatory Visit: Payer: Self-pay

## 2017-10-27 ENCOUNTER — Ambulatory Visit (HOSPITAL_COMMUNITY)
Admission: RE | Admit: 2017-10-27 | Discharge: 2017-10-27 | Disposition: A | Discharge status: Home / Self Care | Payer: Medicaid Other | Source: Ambulatory Visit | Attending: Gastroenterology | Admitting: Gastroenterology

## 2017-10-27 ENCOUNTER — Other Ambulatory Visit (HOSPITAL_COMMUNITY)
Admission: RE | Admit: 2017-10-27 | Discharge: 2017-10-27 | Disposition: A | Discharge status: Home / Self Care | Payer: Medicaid Other | Source: Ambulatory Visit | Attending: Gastroenterology | Admitting: Gastroenterology

## 2017-10-27 DIAGNOSIS — R112 Nausea with vomiting, unspecified: Secondary | ICD-10-CM

## 2017-10-27 DIAGNOSIS — R109 Unspecified abdominal pain: Secondary | ICD-10-CM | POA: Insufficient documentation

## 2017-10-27 LAB — CBC WITH DIFFERENTIAL/PLATELET
BASOS ABS: 0 10*3/uL (ref 0.0–0.1)
Basophils Relative: 0 %
EOS ABS: 0.1 10*3/uL (ref 0.0–0.7)
EOS PCT: 2 %
HCT: 45.2 % (ref 39.0–52.0)
Hemoglobin: 15.5 g/dL (ref 13.0–17.0)
LYMPHS PCT: 32 %
Lymphs Abs: 1.6 10*3/uL (ref 0.7–4.0)
MCH: 35.3 pg — ABNORMAL HIGH (ref 26.0–34.0)
MCHC: 34.3 g/dL (ref 30.0–36.0)
MCV: 103 fL — AB (ref 78.0–100.0)
MONO ABS: 0.4 10*3/uL (ref 0.1–1.0)
Monocytes Relative: 7 %
Neutro Abs: 2.9 10*3/uL (ref 1.7–7.7)
Neutrophils Relative %: 59 %
Platelets: 172 10*3/uL (ref 150–400)
RBC: 4.39 MIL/uL (ref 4.22–5.81)
RDW: 13.5 % (ref 11.5–15.5)
WBC: 5 10*3/uL (ref 4.0–10.5)

## 2017-10-27 LAB — COMPREHENSIVE METABOLIC PANEL
ALBUMIN: 4.1 g/dL (ref 3.5–5.0)
ALK PHOS: 50 U/L (ref 38–126)
ALT: 31 U/L (ref 17–63)
AST: 29 U/L (ref 15–41)
Anion gap: 11 (ref 5–15)
BILIRUBIN TOTAL: 0.9 mg/dL (ref 0.3–1.2)
BUN: 10 mg/dL (ref 6–20)
CALCIUM: 9.4 mg/dL (ref 8.9–10.3)
CO2: 28 mmol/L (ref 22–32)
CREATININE: 0.91 mg/dL (ref 0.61–1.24)
Chloride: 103 mmol/L (ref 101–111)
GFR calc Af Amer: >60 mL/min (ref >60)
GLUCOSE: 74 mg/dL (ref 65–99)
POTASSIUM: 4.4 mmol/L (ref 3.5–5.1)
Sodium: 142 mmol/L (ref 135–145)
TOTAL PROTEIN: 7.5 g/dL (ref 6.5–8.1)

## 2017-10-27 LAB — LIPASE, BLOOD: Lipase: 34 U/L (ref 11–51)

## 2017-10-27 NOTE — Telephone Encounter (Signed)
Dee at [facility] called office. Pt is at radiology for x-ray but no order is in chart. Reviewed phone note. Pt needs acute abdominal series. Dee advised to enter order for DG abd acute w/chest. Order entered.

## 2017-10-27 NOTE — Progress Notes (Signed)
PT is aware.

## 2017-11-22 ENCOUNTER — Other Ambulatory Visit: Payer: Self-pay | Admitting: Gastroenterology

## 2017-11-29 ENCOUNTER — Encounter: Payer: Self-pay | Admitting: Gastroenterology

## 2017-12-14 ENCOUNTER — Other Ambulatory Visit: Payer: Self-pay | Admitting: Gastroenterology

## 2017-12-14 ENCOUNTER — Other Ambulatory Visit: Payer: Self-pay | Admitting: Orthopedic Surgery

## 2017-12-14 DIAGNOSIS — G8929 Other chronic pain: Secondary | ICD-10-CM

## 2017-12-14 DIAGNOSIS — M25562 Pain in left knee: Secondary | ICD-10-CM

## 2017-12-14 DIAGNOSIS — M25561 Pain in right knee: Principal | ICD-10-CM

## 2017-12-23 ENCOUNTER — Ambulatory Visit: Payer: Medicaid Other

## 2017-12-26 ENCOUNTER — Telehealth: Payer: Self-pay

## 2017-12-26 NOTE — Telephone Encounter (Signed)
Received Prescription/Medical supplies order form from [facility] for underpads and ostomy pouches. Is it ok to stamp and fax back?

## 2017-12-27 NOTE — Telephone Encounter (Signed)
Form completed, stamped, and faxed to pharmacy.

## 2017-12-27 NOTE — Telephone Encounter (Signed)
PLEASE STAMP AND SEND BACK. REFILL FOR ONE YEAR. QUANTITY:3 MONTH SUPPLY.

## 2017-12-28 ENCOUNTER — Other Ambulatory Visit: Payer: Self-pay

## 2017-12-28 ENCOUNTER — Telehealth: Payer: Self-pay

## 2017-12-28 DIAGNOSIS — R109 Unspecified abdominal pain: Secondary | ICD-10-CM

## 2017-12-28 NOTE — Telephone Encounter (Signed)
Pt called and said he has had vomiting, diarrhea and abdominal pain x 3 days. His abdominal pain in is the middle of his abdomen, the pain is constant and he rates it at an 8 now. I told him if it worsens to go to the ED and he said that he would.  He does not have a fever and has not been exposed to any viruses that he is aware of. His call back number is [phone]. Please advise!

## 2017-12-28 NOTE — Telephone Encounter (Signed)
PLEASE CALL PT. He either has a virus, a UTI or a CHRON'S flare. He should see his PCP AND SUBMIT A UA WITH MICRO.

## 2017-12-28 NOTE — Telephone Encounter (Signed)
Pt is aware and will try to get to [facility] tomorrow if possible to do the UA with micro.

## 2017-12-29 ENCOUNTER — Other Ambulatory Visit: Payer: Self-pay

## 2017-12-29 ENCOUNTER — Inpatient Hospital Stay (HOSPITAL_COMMUNITY): Payer: Medicaid Other

## 2017-12-29 ENCOUNTER — Encounter (HOSPITAL_COMMUNITY): Payer: Self-pay

## 2017-12-29 ENCOUNTER — Emergency Department (HOSPITAL_COMMUNITY): Payer: Medicaid Other

## 2017-12-29 ENCOUNTER — Inpatient Hospital Stay (HOSPITAL_COMMUNITY)
Admission: EM | Admit: 2017-12-29 | Discharge: 2017-12-31 | DRG: 389 | Disposition: A | Discharge status: Home / Self Care | Payer: Medicaid Other | Attending: Family Medicine | Admitting: Family Medicine

## 2017-12-29 DIAGNOSIS — B952 Enterococcus as the cause of diseases classified elsewhere: Secondary | ICD-10-CM | POA: Diagnosis present

## 2017-12-29 DIAGNOSIS — K435 Parastomal hernia without obstruction or  gangrene: Secondary | ICD-10-CM | POA: Diagnosis present

## 2017-12-29 DIAGNOSIS — K56600 Partial intestinal obstruction, unspecified as to cause: Secondary | ICD-10-CM | POA: Diagnosis present

## 2017-12-29 DIAGNOSIS — Z86718 Personal history of other venous thrombosis and embolism: Secondary | ICD-10-CM

## 2017-12-29 DIAGNOSIS — Z79899 Other long term (current) drug therapy: Secondary | ICD-10-CM

## 2017-12-29 DIAGNOSIS — J45909 Unspecified asthma, uncomplicated: Secondary | ICD-10-CM | POA: Diagnosis present

## 2017-12-29 DIAGNOSIS — G4700 Insomnia, unspecified: Secondary | ICD-10-CM | POA: Diagnosis present

## 2017-12-29 DIAGNOSIS — K9419 Other complications of enterostomy: Secondary | ICD-10-CM | POA: Diagnosis not present

## 2017-12-29 DIAGNOSIS — Z880 Allergy status to penicillin: Secondary | ICD-10-CM | POA: Diagnosis not present

## 2017-12-29 DIAGNOSIS — Z882 Allergy status to sulfonamides status: Secondary | ICD-10-CM | POA: Diagnosis not present

## 2017-12-29 DIAGNOSIS — Z96641 Presence of right artificial hip joint: Secondary | ICD-10-CM | POA: Diagnosis present

## 2017-12-29 DIAGNOSIS — Z932 Ileostomy status: Secondary | ICD-10-CM | POA: Diagnosis not present

## 2017-12-29 DIAGNOSIS — K50812 Crohn's disease of both small and large intestine with intestinal obstruction: Secondary | ICD-10-CM | POA: Diagnosis present

## 2017-12-29 DIAGNOSIS — Z885 Allergy status to narcotic agent status: Secondary | ICD-10-CM

## 2017-12-29 DIAGNOSIS — Z886 Allergy status to analgesic agent status: Secondary | ICD-10-CM | POA: Diagnosis not present

## 2017-12-29 DIAGNOSIS — F17210 Nicotine dependence, cigarettes, uncomplicated: Secondary | ICD-10-CM | POA: Diagnosis present

## 2017-12-29 DIAGNOSIS — K433 Parastomal hernia with obstruction, without gangrene: Secondary | ICD-10-CM | POA: Diagnosis present

## 2017-12-29 DIAGNOSIS — Z86711 Personal history of pulmonary embolism: Secondary | ICD-10-CM

## 2017-12-29 DIAGNOSIS — K5000 Crohn's disease of small intestine without complications: Secondary | ICD-10-CM | POA: Diagnosis not present

## 2017-12-29 DIAGNOSIS — K56609 Unspecified intestinal obstruction, unspecified as to partial versus complete obstruction: Secondary | ICD-10-CM | POA: Diagnosis present

## 2017-12-29 DIAGNOSIS — Z7984 Long term (current) use of oral hypoglycemic drugs: Secondary | ICD-10-CM

## 2017-12-29 DIAGNOSIS — Z9049 Acquired absence of other specified parts of digestive tract: Secondary | ICD-10-CM | POA: Diagnosis not present

## 2017-12-29 DIAGNOSIS — N390 Urinary tract infection, site not specified: Secondary | ICD-10-CM | POA: Diagnosis present

## 2017-12-29 DIAGNOSIS — Z96612 Presence of left artificial shoulder joint: Secondary | ICD-10-CM | POA: Diagnosis present

## 2017-12-29 DIAGNOSIS — Z933 Colostomy status: Secondary | ICD-10-CM | POA: Diagnosis not present

## 2017-12-29 DIAGNOSIS — Z96611 Presence of right artificial shoulder joint: Secondary | ICD-10-CM | POA: Diagnosis present

## 2017-12-29 DIAGNOSIS — K50018 Crohn's disease of small intestine with other complication: Secondary | ICD-10-CM | POA: Diagnosis not present

## 2017-12-29 DIAGNOSIS — F319 Bipolar disorder, unspecified: Secondary | ICD-10-CM | POA: Diagnosis present

## 2017-12-29 DIAGNOSIS — K50819 Crohn's disease of both small and large intestine with unspecified complications: Secondary | ICD-10-CM | POA: Diagnosis present

## 2017-12-29 DIAGNOSIS — Z4659 Encounter for fitting and adjustment of other gastrointestinal appliance and device: Secondary | ICD-10-CM

## 2017-12-29 DIAGNOSIS — K219 Gastro-esophageal reflux disease without esophagitis: Secondary | ICD-10-CM | POA: Diagnosis present

## 2017-12-29 DIAGNOSIS — E785 Hyperlipidemia, unspecified: Secondary | ICD-10-CM | POA: Diagnosis present

## 2017-12-29 HISTORY — DX: Unspecified intestinal obstruction, unspecified as to partial versus complete obstruction: K56.609

## 2017-12-29 LAB — URINALYSIS, ROUTINE W REFLEX MICROSCOPIC
BILIRUBIN URINE: NEGATIVE
GLUCOSE, UA: NEGATIVE mg/dL
KETONES UR: NEGATIVE mg/dL
NITRITE: POSITIVE — AB
PH: 5 (ref 5.0–8.0)
Protein, ur: NEGATIVE mg/dL
SPECIFIC GRAVITY, URINE: 1.017 (ref 1.005–1.030)

## 2017-12-29 LAB — CBC WITH DIFFERENTIAL/PLATELET
BASOS ABS: 0 10*3/uL (ref 0.0–0.1)
Basophils Relative: 0 %
EOS PCT: 0 %
Eosinophils Absolute: 0 10*3/uL (ref 0.0–0.7)
HEMATOCRIT: 49.6 % (ref 39.0–52.0)
Hemoglobin: 17 g/dL (ref 13.0–17.0)
LYMPHS PCT: 4 %
Lymphs Abs: 0.3 10*3/uL — ABNORMAL LOW (ref 0.7–4.0)
MCH: 34.8 pg — ABNORMAL HIGH (ref 26.0–34.0)
MCHC: 34.3 g/dL (ref 30.0–36.0)
MCV: 101.6 fL — AB (ref 78.0–100.0)
MONO ABS: 0.7 10*3/uL (ref 0.1–1.0)
MONOS PCT: 9 %
NEUTROS ABS: 6.6 10*3/uL (ref 1.7–7.7)
Neutrophils Relative %: 87 %
PLATELETS: 138 10*3/uL — AB (ref 150–400)
RBC: 4.88 MIL/uL (ref 4.22–5.81)
RDW: 14.9 % (ref 11.5–15.5)
WBC: 7.6 10*3/uL (ref 4.0–10.5)

## 2017-12-29 LAB — COMPREHENSIVE METABOLIC PANEL
ALT: 52 U/L (ref 17–63)
ANION GAP: 18 — AB (ref 5–15)
AST: 28 U/L (ref 15–41)
Albumin: 4 g/dL (ref 3.5–5.0)
Alkaline Phosphatase: 72 U/L (ref 38–126)
BILIRUBIN TOTAL: 2.1 mg/dL — AB (ref 0.3–1.2)
BUN: 19 mg/dL (ref 6–20)
CHLORIDE: 96 mmol/L — AB (ref 101–111)
CO2: 23 mmol/L (ref 22–32)
Calcium: 10.4 mg/dL — ABNORMAL HIGH (ref 8.9–10.3)
Creatinine, Ser: 1.08 mg/dL (ref 0.61–1.24)
Glucose, Bld: 164 mg/dL — ABNORMAL HIGH (ref 65–99)
POTASSIUM: 4.2 mmol/L (ref 3.5–5.1)
Sodium: 137 mmol/L (ref 135–145)
TOTAL PROTEIN: 7.7 g/dL (ref 6.5–8.1)

## 2017-12-29 LAB — LIPASE, BLOOD: Lipase: 20 U/L (ref 11–51)

## 2017-12-29 LAB — TROPONIN I

## 2017-12-29 MED ORDER — DIVALPROEX SODIUM ER 500 MG PO TB24
1000.0000 mg | ORAL_TABLET | Freq: Every day | ORAL | Status: DC
  Administered 2017-12-29 – 2017-12-30 (×2): 1000 mg via ORAL
  Filled 2017-12-29: qty 2

## 2017-12-29 MED ORDER — SODIUM CHLORIDE 0.9 % IV SOLN
INTRAVENOUS | Status: AC
  Administered 2017-12-29 – 2017-12-30 (×2): via INTRAVENOUS

## 2017-12-29 MED ORDER — DIVALPROEX SODIUM ER 250 MG PO TB24
250.0000 mg | ORAL_TABLET | Freq: Every day | ORAL | Status: DC
  Administered 2017-12-30: 250 mg via ORAL
  Filled 2017-12-29 (×4): qty 1

## 2017-12-29 MED ORDER — ALBUTEROL SULFATE (2.5 MG/3ML) 0.083% IN NEBU: 2.5000 mg | INHALATION_SOLUTION | Freq: Four times a day (QID) | RESPIRATORY_TRACT | Status: DC | PRN

## 2017-12-29 MED ORDER — MORPHINE SULFATE (PF) 4 MG/ML IV SOLN
4.0000 mg | Freq: Once | INTRAVENOUS | Status: AC
  Administered 2017-12-29: 4 mg via INTRAVENOUS
  Filled 2017-12-29: qty 1

## 2017-12-29 MED ORDER — DIVALPROEX SODIUM ER 250 MG PO TB24
ORAL_TABLET | ORAL | Status: AC
  Filled 2017-12-29: qty 4

## 2017-12-29 MED ORDER — MORPHINE SULFATE (PF) 4 MG/ML IV SOLN
4.0000 mg | INTRAVENOUS | Status: DC | PRN
  Administered 2017-12-29 – 2017-12-31 (×8): 4 mg via INTRAVENOUS
  Filled 2017-12-29 (×8): qty 1

## 2017-12-29 MED ORDER — DIVALPROEX SODIUM ER 500 MG PO TB24: 1000.0000 mg | ORAL_TABLET | Freq: Every evening | ORAL | Status: DC

## 2017-12-29 MED ORDER — BENZTROPINE MESYLATE 1 MG PO TABS
0.5000 mg | ORAL_TABLET | Freq: Every day | ORAL | Status: DC
  Administered 2017-12-30 – 2017-12-31 (×2): 0.5 mg via ORAL
  Filled 2017-12-29 (×3): qty 1

## 2017-12-29 MED ORDER — IOPAMIDOL (ISOVUE-300) INJECTION 61%
100.0000 mL | Freq: Once | INTRAVENOUS | Status: AC | PRN
  Administered 2017-12-29: 100 mL via INTRAVENOUS

## 2017-12-29 MED ORDER — SODIUM CHLORIDE 0.9 % IV BOLUS
1000.0000 mL | Freq: Once | INTRAVENOUS | Status: AC
  Administered 2017-12-29: 1000 mL via INTRAVENOUS

## 2017-12-29 MED ORDER — IOPAMIDOL (ISOVUE-300) INJECTION 61%
30.0000 mL | Freq: Once | INTRAVENOUS | Status: AC | PRN
  Administered 2017-12-29: 30 mL via ORAL

## 2017-12-29 MED ORDER — ONDANSETRON HCL 4 MG/2ML IJ SOLN: 4.0000 mg | Freq: Four times a day (QID) | INTRAMUSCULAR | Status: DC | PRN

## 2017-12-29 MED ORDER — ONDANSETRON HCL 4 MG PO TABS: 4.0000 mg | ORAL_TABLET | Freq: Four times a day (QID) | ORAL | Status: DC | PRN

## 2017-12-29 MED ORDER — ALBUTEROL SULFATE HFA 108 (90 BASE) MCG/ACT IN AERS: 2.0000 | INHALATION_SPRAY | Freq: Four times a day (QID) | RESPIRATORY_TRACT | Status: DC | PRN

## 2017-12-29 MED ORDER — MIRTAZAPINE 30 MG PO TABS
15.0000 mg | ORAL_TABLET | Freq: Every day | ORAL | Status: DC
  Administered 2017-12-29 – 2017-12-30 (×2): 15 mg via ORAL
  Filled 2017-12-29 (×2): qty 1

## 2017-12-29 MED ORDER — DIVALPROEX SODIUM ER 250 MG PO TB24
ORAL_TABLET | ORAL | Status: AC
  Filled 2017-12-29: qty 1

## 2017-12-29 NOTE — ED Notes (Signed)
Report given to Sallis, Therapist, sports.

## 2017-12-29 NOTE — ED Notes (Signed)
Verified with Dr. Constance Haw that RN is to insert NG.

## 2017-12-29 NOTE — Consult Note (Signed)
[facility] Consult  Reason for Consult:pSBO  Referring Physician:  Dr. Winfred Leeds   Chief Complaint    Emesis; Abdominal Pain      [name] is a 52 y.o. male.  HPI: [name] is a 52 yo who has a history of Crohn's s/p total colectomy with end ileostomy in 2009 at [facility] per his report. He presented to the ED today with abdominal pain and nausea/vomiting. He has not had significant output from the ostomy in several days.  He has had a SBO in the past requiring LOA 2014 with Dr. Elby Showers, but has never required any further resection. 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  He has not had any fevers or chills. Denies chest pain or SOB.  Past Medical History:  Diagnosis Date  . Arthritis   . Asthma   . 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 Bipolar disorder ([place])   . Colostomy in place [facility])   . Crohn's colitis ([place])    s/p total colectomy with ileostomy in 2009  . Crohn's disease of small intestine [facility]) Sept 2012   ileoscopy: multiple ulcers likely secondary to Crohn's  . DEEP VENOUS THROMBOPHLEBITIS, HX OF 02/11/2009   Qualifier: Diagnosis of  By: Zeb Comfort    . Depression   . DVT (deep venous thrombosis) ([place]) 2009   right upper extremity due to PICC  . Enterococcus UTI 2009  . GERD (gastroesophageal reflux disease)   . Hernia   . Hyperlipidemia   . Insomnia   . Migraine headache   . Nystagmus   . PULMONARY EMBOLISM, HX OF 02/11/2009   Qualifier: Diagnosis of  By: Zeb Comfort    . S/P endoscopy Sept 2012   mild gastritis, small hiatal hernia, no ulcers  . Schizophrenia ([place])   . Schizophrenia, acute [facility])     Past Surgical History:  Procedure Laterality Date  . APPENDECTOMY    . ESOPHAGOGASTRODUODENOSCOPY  07/2010   gastritis,  bx neg for H.Pylori  . ESOPHAGOGASTRODUODENOSCOPY N/A  01/08/2014   SLF:NO SOURCE FOR ODYNOPHAGIA IDENTIFIED/Multiple small ulcers in the gastric antrum  . EYE SURGERY    . ILEOSCOPY  06/29/2011   SLF: ulcers,multiple/small HH/mild gastritis  . KIDNEY SURGERY    . LAPAROTOMY N/A 06/05/2013   Procedure: EXPLORATORY LAPAROTOMY;  Surgeon: Donato Heinz, MD;  Location: [facility];  Service: General;  Laterality: N/A;  . LYSIS OF ADHESION N/A 06/05/2013   Procedure: LYSIS OF ADHESIONS;  Surgeon: Donato Heinz, MD;  Location: [facility];  Service: General;  Laterality: N/A;  . small bowel capsule study  08/2010   few tiny nonbleeding erosions/ulcers ?secondary to relafen or Crohn's. Entire small bowel not seen.   Marland Kitchen TOTAL COLECTOMY  2009   with ileostomy at [facility] for refractory disease   . TOTAL HIP ARTHROPLASTY     right, due to avascular necrosis from chronic prednisone  . TOTAL SHOULDER REPLACEMENT     bilateral    No family history on file.  Social History   Tobacco Use  . Smoking status: Current Every Day Smoker    Packs/day: 1.00    Types: Cigarettes  . Smokeless tobacco: Never Used  . Tobacco comment: one pack daily  Substance Use Topics  . Alcohol use: No  . Drug use: No  Medications: I have reviewed the patient's current medications. No current facility-administered medications for this encounter.    Current Outpatient Medications  Medication Sig Dispense Refill Last Dose  . acetaminophen (TYLENOL) 500 MG tablet Take 1,000 mg by mouth every 6 (six) hours as needed. For pain    12/28/2017 at Unknown time  . albuterol (PROAIR HFA) 108 (90 BASE) MCG/ACT inhaler Inhale 2 puffs into the lungs every 6 (six) hours as needed for wheezing or shortness of breath.   Taking  . albuterol (PROVENTIL) (2.5 MG/3ML) 0.083% nebulizer solution Take 2.5 mg by nebulization every 6 (six) hours as needed for wheezing or shortness of breath.   Taking  . atorvastatin (LIPITOR) 80 MG tablet Take 80 mg by mouth daily.   12/28/2017 at Unknown time  .  benztropine (COGENTIN) 0.5 MG tablet Take 0.5 mg by mouth daily.    12/28/2017 at Unknown time  . Calcium Carbonate-Vitamin D (CALCIUM 600+D) 600-400 MG-UNIT tablet 1 PO TID AFTER A MEAL 90 tablet 11 12/28/2017 at Unknown time  . dicyclomine (BENTYL) 10 MG capsule TAKE 1 OR 2 CAPSULE BY MOUTH AS DIRECTED BEFORE MEALS AND AT BEDTIME. 60 capsule 3 12/28/2017 at Unknown time  . divalproex (DEPAKOTE ER) 250 MG 24 hr tablet Take 250 mg by mouth at bedtime.    12/28/2017 at Unknown time  . divalproex (DEPAKOTE) 500 MG 24 hr tablet Take 1,000 mg by mouth every evening.    12/28/2017 at Unknown time  . folic acid (FOLVITE) 1 MG tablet Take 1 mg by mouth daily.     12/28/2017 at Unknown time  . furosemide (LASIX) 40 MG tablet Take 40 mg by mouth daily.     12/28/2017 at Unknown time  . gabapentin (NEURONTIN) 300 MG capsule Take 900 mg by mouth 3 (three) times daily.    12/28/2017 at Unknown time  . mercaptopurine (PURINETHOL) 50 MG tablet TAKE 2 TABLETS DAILY ON EMPTY STOMACH 1 HOUR BEFORE OR 2 HOURS AFTER M EAL 60 tablet 5 12/28/2017 at Unknown time  . metFORMIN (GLUCOPHAGE) 500 MG tablet Take by mouth 2 (two) times daily with a meal.   12/28/2017 at Unknown time  . mirtazapine (REMERON) 15 MG tablet Take 15 mg by mouth at bedtime.   12/28/2017 at Unknown time  . pantoprazole (PROTONIX) 40 MG tablet TAKE (1) TABLET BY MOUTH TWICE A DAY BEFORE MEALS. (BREAKFAST AND SUPPER) 60 tablet 11 12/28/2017 at Unknown time  . potassium chloride SA (K-DUR,KLOR-CON) 20 MEQ tablet Take 20 mEq by mouth daily.    12/28/2017 at Unknown time  . risperiDONE (RISPERDAL) 2 MG tablet Take 2 mg by mouth at bedtime. 3.5 tabs qhs   12/28/2017 at Unknown time  . sertraline (ZOLOFT) 50 MG tablet Take 50 mg by mouth daily.   12/28/2017 at Unknown time  . tamsulosin (FLOMAX) 0.4 MG CAPS capsule Take 0.4 mg by mouth daily.   12/28/2017 at Unknown time  . Incontinence Supply Disposable (PROCARE ADULT BRIEFS LARGE) MISC Use as directed 25 each 11 Taking   . lidocaine (XYLOCAINE) 2 % solution 2 TSP  PO Q4-6H PRN FOR HEARTBURN OR UPPER ABDOMINAL PAIN 300 mL 11     Allergies: Allergies  Allergen Reactions  . Aspirin     REACTION: unknown reaction  . Bactrim [Sulfamethoxazole-Trimethoprim]     ABD CRAMPS AND DIARRHEA  . Codeine     REACTION: unknown reaction  . Ibuprofen     REACTION: unknown reaction  . Penicillins  REACTION: unknown reaction  . Remicade [Infliximab]     COULDN'T BREATHE  . Tramadol Hcl     REACTION: unknown reaction  . Vicodin [Hydrocodone-Acetaminophen] Nausea Only      ROS:  A comprehensive review of systems was negative except for: Gastrointestinal: positive for abdominal pain, nausea, vomiting and parastomal hernia ileostomy  Blood pressure 137/86, pulse 96, temperature 97.9 F (36.6 C), temperature source Oral, resp. rate 17, height 5' 6"  (1.676 m), weight 208 lb (94.3 kg), SpO2 94 %. Physical Exam  Constitutional: He is oriented to person, place, and time and well-developed, well-nourished, and in no distress.  HENT:  Head: Normocephalic.  Eyes: Pupils are equal, round, and reactive to light.  Neck: Normal range of motion.  Cardiovascular: Normal rate.  Pulmonary/Chest: Effort normal.  Abdominal: Soft. He exhibits distension. There is tenderness. There is no rebound and no guarding.  End ileostomy with bulging around ostomy site, pink, some stool in bag  Musculoskeletal: He exhibits no edema.  Neurological: He is alert and oriented to person, place, and time.  Skin: Skin is warm and dry.  Psychiatric: Mood, memory, affect and judgment normal.  Vitals reviewed.   Results: Results for orders placed or performed during the hospital encounter of 12/29/17 (from the past 48 hour(s))  CBC with Differential     Status: Abnormal   Collection Time: 12/29/17 11:14 AM  Result Value Ref Range   WBC 7.6 4.0 - 10.5 K/uL   RBC 4.88 4.22 - 5.81 MIL/uL   Hemoglobin 17.0 13.0 - 17.0 g/dL   HCT 49.6 39.0 -  52.0 %   MCV 101.6 (H) 78.0 - 100.0 fL   MCH 34.8 (H) 26.0 - 34.0 pg   MCHC 34.3 30.0 - 36.0 g/dL   RDW 14.9 11.5 - 15.5 %   Platelets 138 (L) 150 - 400 K/uL   Neutrophils Relative % 87 %   Neutro Abs 6.6 1.7 - 7.7 K/uL   Lymphocytes Relative 4 %   Lymphs Abs 0.3 (L) 0.7 - 4.0 K/uL   Monocytes Relative 9 %   Monocytes Absolute 0.7 0.1 - 1.0 K/uL   Eosinophils Relative 0 %   Eosinophils Absolute 0.0 0.0 - 0.7 K/uL   Basophils Relative 0 %   Basophils Absolute 0.0 0.0 - 0.1 K/uL    Comment: Performed at [facility], [address], [place], [place] [address]  Comprehensive metabolic panel     Status: Abnormal   Collection Time: 12/29/17 11:14 AM  Result Value Ref Range   Sodium 137 135 - 145 mmol/L   Potassium 4.2 3.5 - 5.1 mmol/L   Chloride 96 (L) 101 - 111 mmol/L   CO2 23 22 - 32 mmol/L   Glucose, Bld 164 (H) 65 - 99 mg/dL   BUN 19 6 - 20 mg/dL   Creatinine, Ser 1.08 0.61 - 1.24 mg/dL   Calcium 10.4 (H) 8.9 - 10.3 mg/dL   Total Protein 7.7 6.5 - 8.1 g/dL   Albumin 4.0 3.5 - 5.0 g/dL   AST 28 15 - 41 U/L   ALT 52 17 - 63 U/L   Alkaline Phosphatase 72 38 - 126 U/L   Total Bilirubin 2.1 (H) 0.3 - 1.2 mg/dL   GFR calc non Af Amer >60 >60 mL/min   GFR calc Af Amer >60 >60 mL/min    Comment: (NOTE) The eGFR has been calculated using the CKD EPI equation. This calculation has not been validated in all clinical situations. eGFR's persistently <60  mL/min signify possible Chronic Kidney Disease.    Anion gap 18 (H) 5 - 15    Comment: Performed at [facility], [address] [place], [place] [address]  Troponin I     Status: None   Collection Time: 12/29/17 11:14 AM  Result Value Ref Range   Troponin I <0.03 <0.03 ng/mL    Comment: Performed at [facility], [address] [place], [place] [address]  Urinalysis, Routine w reflex microscopic     Status: Abnormal   Collection Time: 12/29/17 11:40 AM  Result Value Ref Range   Color, Urine YELLOW YELLOW   APPearance HAZY (A)  CLEAR   Specific Gravity, Urine 1.017 1.005 - 1.030   pH 5.0 5.0 - 8.0   Glucose, UA NEGATIVE NEGATIVE mg/dL   Hgb urine dipstick SMALL (A) NEGATIVE   Bilirubin Urine NEGATIVE NEGATIVE   Ketones, ur NEGATIVE NEGATIVE mg/dL   Protein, ur NEGATIVE NEGATIVE mg/dL   Nitrite POSITIVE (A) NEGATIVE   Leukocytes, UA LARGE (A) NEGATIVE   RBC / HPF 0-5 0 - 5 RBC/hpf   WBC, UA TOO NUMEROUS TO COUNT 0 - 5 WBC/hpf   Bacteria, UA MANY (A) NONE SEEN   Squamous Epithelial / LPF 0-5 (A) NONE SEEN   Mucus PRESENT    Hyaline Casts, UA PRESENT     Comment: Performed at [facility], [address] [place], [place] [address]  Lipase, blood     Status: None   Collection Time: 12/29/17  1:43 PM  Result Value Ref Range   Lipase 20 11 - 51 U/L    Comment: Performed at [facility], [address] [place], [place] [address]   Personally reviewed- dilated small bowel with parastomal hernia at end ileostomy. Site of transition just prior to the fascial opening where the ileostomy goes into the subcutaneous tissue, not in the hernia; no fever air and minimal fluid; fecalized stool in the ostomy itself   Ct Abdomen Pelvis W Contrast  Result Date: 12/29/2017 CLINICAL DATA:  Generalized abdominal pain for 2 days, no bowel movement. History of total colectomy and Crohn's disease. EXAM: CT ABDOMEN AND PELVIS WITH CONTRAST TECHNIQUE: Multidetector CT imaging of the abdomen and pelvis was performed using the standard protocol following bolus administration of intravenous contrast. CONTRAST:  73m ISOVUE-300 IOPAMIDOL (ISOVUE-300) INJECTION 61%, 1041mISOVUE-300 IOPAMIDOL (ISOVUE-300) INJECTION 61% COMPARISON:  CT abdomen and pelvis March 05, 2014 FINDINGS: LOWER CHEST: Mild dependent atelectasis. Included heart size is normal. No pericardial effusion. HEPATOBILIARY: Liver and gallbladder are normal. PANCREAS: Normal. SPLEEN: Normal. ADRENALS/URINARY TRACT: Kidneys are orthotopic, demonstrating symmetric enhancement. New LEFT  upper pole scarring and cortical atrophy. No nephrolithiasis, hydronephrosis or solid renal masses. The unopacified ureters are normal in course and caliber. Delayed imaging through the kidneys demonstrates symmetric prompt contrast excretion within the proximal urinary collecting system. Urinary bladder is well distended with mild predominately LEFT-sided urinary bladder wall thickening. Stable 18 mm LEFT adrenal nodule. STOMACH/BOWEL: Status post total colectomy. Redundant stool filled small bowel at RIGHT mid abdomen ileostomy. Mildly dilated small bowel to 3.4 cm with transition point at ileostomy. Status post total colectomy. VASCULAR/LYMPHATIC: Aortoiliac vessels are normal in course and caliber. Mild atherosclerosis. No lymphadenopathy by CT size criteria. REPRODUCTIVE: Normal. OTHER: Small amount of free fluid in ileostomy and RIGHT abdomen. MUSCULOSKELETAL: Nonacute. Streak artifact from RIGHT hip total arthroplasty. IMPRESSION: 1. Early versus partial small bowel obstruction with transition point at ileostomy. Status post total colectomy. 2. Small volume ascites.  No drainable  fluid collection. 3. Mildly thickened urinary bladder concerning for cystitis. New LEFT renal scarring. Aortic Atherosclerosis (ICD10-I70.0). Electronically Signed   By: Elon Alas M.D.   On: 12/29/2017 18:17    Assessment & Plan:  [name] is a 52 y.o. male with pSBO at the level of the small bowel just prior to entry into the subcutaneous tissue, appears partial due to having some stool in bag.  Abdominal exam with generalized tenderness but soft and no guarding.   -NG/ NPO, will plan to digitalize ostomy in the next day as his bag is not one that can be opened  -Ostomy supplies -IVF   All questions were answered to the satisfaction of the patient.   Virl Cagey 12/29/2017, 7:03 PM

## 2017-12-29 NOTE — ED Notes (Signed)
NG tube advanced at this time.

## 2017-12-29 NOTE — ED Provider Notes (Signed)
[facility] Provider Note   CSN: [id number] Arrival date & time: 12/29/17  1053     History   Chief Complaint Chief Complaint  Patient presents with  . Emesis  . Abdominal Pain    HPI [name] is a 52 y.o. male.  HPI Complains of generalized abdominal pain, abdominal distention and no bowel movement for the past 2 days.  He vomited 8 times yesterday and twice today.  No hematemesis.  No known fever.  No treatment prior to coming here.  Last bowel movement 3 days ago.  Nothing makes symptoms better or worse.  No other associated symptoms Past Medical History:  Diagnosis Date  . Arthritis   . Asthma   . 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 Bipolar disorder ([place])   . Colostomy in place [facility])   . Crohn's colitis ([place])    s/p total colectomy with ileostomy in 2009  . Crohn's disease of small intestine [facility]) Sept 2012   ileoscopy: multiple ulcers likely secondary to Crohn's  . DEEP VENOUS THROMBOPHLEBITIS, HX OF 02/11/2009   Qualifier: Diagnosis of  By: Zeb Comfort    . Depression   . DVT (deep venous thrombosis) ([place]) 2009   right upper extremity due to PICC  . Enterococcus UTI 2009  . GERD (gastroesophageal reflux disease)   . Hernia   . Hyperlipidemia   . Insomnia   . Migraine headache   . Nystagmus   . PULMONARY EMBOLISM, HX OF 02/11/2009   Qualifier: Diagnosis of  By: Zeb Comfort    . S/P endoscopy Sept 2012   mild gastritis, small hiatal hernia, no ulcers  . Schizophrenia ([place])   . Schizophrenia, acute [facility])     Patient Active Problem List   Diagnosis Date Noted  . Arthritis of knee, degenerative 02/14/2013  . 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 Crohn's disease of small intestine ([place]) 07/19/2011  . Hepatic steatosis 07/19/2011  . HYPERLIPIDEMIA 02/11/2009  . BIPOLAR AFFECTIVE DISORDER 02/11/2009  . NYSTAGMUS 02/11/2009  . ASTHMA 02/11/2009  . GERD 02/11/2009  .  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 INSOMNIA 02/11/2009    Past Surgical History:  Procedure Laterality Date  . APPENDECTOMY    . ESOPHAGOGASTRODUODENOSCOPY  07/2010   gastritis,  bx neg for H.Pylori  . ESOPHAGOGASTRODUODENOSCOPY N/A 01/08/2014   SLF:NO SOURCE FOR ODYNOPHAGIA IDENTIFIED/Multiple small ulcers in the gastric antrum  . EYE SURGERY    . ILEOSCOPY  06/29/2011   SLF: ulcers,multiple/small HH/mild gastritis  . KIDNEY SURGERY    . LAPAROTOMY N/A 06/05/2013   Procedure: EXPLORATORY LAPAROTOMY;  Surgeon: Donato Heinz, MD;  Location: [facility];  Service: General;  Laterality: N/A;  . LYSIS OF ADHESION N/A 06/05/2013   Procedure: LYSIS OF ADHESIONS;  Surgeon: Donato Heinz, MD;  Location: [facility];  Service: General;  Laterality: N/A;  . small bowel capsule study  08/2010   few tiny nonbleeding erosions/ulcers ?secondary to relafen or Crohn's. Entire small bowel not seen.   Marland Kitchen TOTAL COLECTOMY  2009   with ileostomy at [facility] for refractory disease   . TOTAL HIP ARTHROPLASTY     right, due to avascular necrosis from chronic prednisone  . TOTAL SHOULDER REPLACEMENT     bilateral        Home Medications    Prior to Admission medications   Medication Sig Start Date End Date Taking? Authorizing Provider  acetaminophen (TYLENOL) 500 MG tablet Take 1,000 mg by mouth every 6 (six) hours as needed. For pain    Yes [provider]  albuterol (PROAIR HFA) 108 (90 BASE) MCG/ACT inhaler Inhale 2 puffs into the lungs every 6 (six) hours as needed for wheezing or shortness of breath.   Yes [provider]  albuterol (PROVENTIL) (2.5 MG/3ML) 0.083% nebulizer solution Take 2.5 mg by nebulization every 6 (six) hours as needed for wheezing or shortness of breath.   Yes [provider]  atorvastatin (LIPITOR) 80 MG tablet Take 80 mg by mouth daily.   Yes [provider]  benztropine (COGENTIN) 0.5 MG tablet Take 0.5 mg by mouth daily.    Yes [provider]  Calcium Carbonate-Vitamin D (CALCIUM 600+D) 600-400 MG-UNIT tablet 1 PO TID AFTER A MEAL 07/13/17  Yes Fields, Sandi L, MD  dicyclomine (BENTYL) 10 MG capsule TAKE 1 OR 2 CAPSULE BY MOUTH AS DIRECTED BEFORE MEALS AND AT BEDTIME. 03/11/17  Yes Annitta Needs, NP  divalproex (DEPAKOTE ER) 250 MG 24 hr tablet Take 250 mg by mouth at bedtime.    Yes [provider]  divalproex (DEPAKOTE) 500 MG 24 hr tablet Take 1,000 mg by mouth every evening.    Yes [provider]  folic acid (FOLVITE) 1 MG tablet Take 1 mg by mouth daily.     Yes [provider]  furosemide (LASIX) 40 MG tablet Take 40 mg by mouth daily.     Yes [provider]  gabapentin (NEURONTIN) 300 MG capsule Take 900 mg by mouth 3 (three) times daily.    Yes [provider]  mercaptopurine (PURINETHOL) 50 MG tablet TAKE 2 TABLETS DAILY ON EMPTY STOMACH 1 HOUR BEFORE OR 2 HOURS AFTER M EAL 11/23/17  Yes Mahala Menghini, PA-C  metFORMIN (GLUCOPHAGE) 500 MG tablet Take by mouth 2 (two) times daily with a meal.   Yes [provider]  pantoprazole (PROTONIX) 40 MG tablet TAKE (1) TABLET BY MOUTH TWICE A DAY BEFORE MEALS. (BREAKFAST AND SUPPER) 12/16/17  Yes Mahala Menghini, PA-C  potassium chloride SA (K-DUR,KLOR-CON) 20 MEQ tablet Take 20 mEq by mouth daily.    Yes [provider]  risperiDONE (RISPERDAL) 2 MG tablet Take 2 mg by mouth at bedtime. 3.5 tabs qhs   Yes [provider]  sertraline (ZOLOFT) 50 MG tablet Take 50 mg by mouth daily.   Yes [provider]  Incontinence Supply Disposable (PROCARE ADULT BRIEFS LARGE) MISC Use as directed 03/25/16   Fields, Marga Melnick, MD  lidocaine (XYLOCAINE) 2 % solution 2 TSP  PO Q4-6H PRN FOR HEARTBURN OR UPPER ABDOMINAL PAIN 07/13/17   Danie Binder, MD    Family History No family history on file.  Social History Social History   Tobacco Use  . Smoking status: Current Every Day Smoker    Packs/day: 1.00      Types: Cigarettes  . Smokeless tobacco: Never Used  . Tobacco comment: one pack daily  Substance Use Topics  . Alcohol use: No  . Drug use: No     Allergies   Aspirin; Bactrim [sulfamethoxazole-trimethoprim]; Codeine; Ibuprofen; Penicillins; Remicade [infliximab]; Tramadol hcl; and Vicodin [hydrocodone-acetaminophen]   Review of Systems Review of Systems  Gastrointestinal: Positive for abdominal distention and abdominal pain. Negative for blood in stool.       Colostomy in place  Allergic/Immunologic: Positive for immunocompromised state.       Diabetic  All other systems  reviewed and are negative.    Physical Exam Updated Vital Signs BP (!) 135/91   Pulse 92   Temp 97.9 F (36.6 C) (Oral)   Resp 17   Ht 5' 6"  (1.676 m)   Wt 94.3 kg (208 lb)   SpO2 93%   BMI 33.57 kg/m   Physical Exam  Constitutional:  Chronically ill-appearing  HENT:  Head: Normocephalic and atraumatic.  Eyes: Pupils are equal, round, and reactive to light. Conjunctivae are normal.  Neck: Neck supple. No tracheal deviation present. No thyromegaly present.  Cardiovascular: Normal rate and regular rhythm.  No murmur heard. Pulmonary/Chest: Effort normal and breath sounds normal.  Abdominal: Soft. Bowel sounds are normal. He exhibits distension. There is tenderness. There is no guarding.  Multiple surgical scars.  Colostomy in place.  Small amount of liquid brown stool in colostomy bag diffuse tenderness  Genitourinary: Penis normal.  Musculoskeletal: Normal range of motion. He exhibits no edema or tenderness.  Neurological: He is alert. Coordination normal.  Skin: Skin is warm and dry. No rash noted.  Psychiatric: He has a normal mood and affect.  Nursing note and vitals reviewed.    ED Treatments / Results  Labs (all labs ordered are listed, but only abnormal results are displayed) Labs Reviewed  CBC WITH DIFFERENTIAL/PLATELET - Abnormal; Notable for the following components:       Result Value   MCV 101.6 (*)    MCH 34.8 (*)    Platelets 138 (*)    Lymphs Abs 0.3 (*)    All other components within normal limits  COMPREHENSIVE METABOLIC PANEL - Abnormal; Notable for the following components:   Chloride 96 (*)    Glucose, Bld 164 (*)    Calcium 10.4 (*)    Total Bilirubin 2.1 (*)    Anion gap 18 (*)    All other components within normal limits  URINALYSIS, ROUTINE W REFLEX MICROSCOPIC - Abnormal; Notable for the following components:   APPearance HAZY (*)    Hgb urine dipstick SMALL (*)    Nitrite POSITIVE (*)    Leukocytes, UA LARGE (*)    Bacteria, UA MANY (*)    Squamous Epithelial / LPF 0-5 (*)    All other components within normal limits  LIPASE, BLOOD   Results for orders placed or performed during the hospital encounter of 12/29/17  CBC with Differential  Result Value Ref Range   WBC 7.6 4.0 - 10.5 K/uL   RBC 4.88 4.22 - 5.81 MIL/uL   Hemoglobin 17.0 13.0 - 17.0 g/dL   HCT 49.6 39.0 - 52.0 %   MCV 101.6 (H) 78.0 - 100.0 fL   MCH 34.8 (H) 26.0 - 34.0 pg   MCHC 34.3 30.0 - 36.0 g/dL   RDW 14.9 11.5 - 15.5 %   Platelets 138 (L) 150 - 400 K/uL   Neutrophils Relative % 87 %   Neutro Abs 6.6 1.7 - 7.7 K/uL   Lymphocytes Relative 4 %   Lymphs Abs 0.3 (L) 0.7 - 4.0 K/uL   Monocytes Relative 9 %   Monocytes Absolute 0.7 0.1 - 1.0 K/uL   Eosinophils Relative 0 %   Eosinophils Absolute 0.0 0.0 - 0.7 K/uL   Basophils Relative 0 %   Basophils Absolute 0.0 0.0 - 0.1 K/uL  Comprehensive metabolic panel  Result Value Ref Range   Sodium 137 135 - 145 mmol/L   Potassium 4.2 3.5 - 5.1 mmol/L   Chloride 96 (L) 101 - 111 mmol/L  CO2 23 22 - 32 mmol/L   Glucose, Bld 164 (H) 65 - 99 mg/dL   BUN 19 6 - 20 mg/dL   Creatinine, Ser 1.08 0.61 - 1.24 mg/dL   Calcium 10.4 (H) 8.9 - 10.3 mg/dL   Total Protein 7.7 6.5 - 8.1 g/dL   Albumin 4.0 3.5 - 5.0 g/dL   AST 28 15 - 41 U/L   ALT 52 17 - 63 U/L   Alkaline Phosphatase 72 38 - 126 U/L   Total Bilirubin 2.1  (H) 0.3 - 1.2 mg/dL   GFR calc non Af Amer >60 >60 mL/min   GFR calc Af Amer >60 >60 mL/min   Anion gap 18 (H) 5 - 15  Urinalysis, Routine w reflex microscopic  Result Value Ref Range   Color, Urine YELLOW YELLOW   APPearance HAZY (A) CLEAR   Specific Gravity, Urine 1.017 1.005 - 1.030   pH 5.0 5.0 - 8.0   Glucose, UA NEGATIVE NEGATIVE mg/dL   Hgb urine dipstick SMALL (A) NEGATIVE   Bilirubin Urine NEGATIVE NEGATIVE   Ketones, ur NEGATIVE NEGATIVE mg/dL   Protein, ur NEGATIVE NEGATIVE mg/dL   Nitrite POSITIVE (A) NEGATIVE   Leukocytes, UA LARGE (A) NEGATIVE   RBC / HPF 0-5 0 - 5 RBC/hpf   WBC, UA TOO NUMEROUS TO COUNT 0 - 5 WBC/hpf   Bacteria, UA MANY (A) NONE SEEN   Squamous Epithelial / LPF 0-5 (A) NONE SEEN   Mucus PRESENT    Hyaline Casts, UA PRESENT   Lipase, blood  Result Value Ref Range   Lipase 20 11 - 51 U/L  Troponin I  Result Value Ref Range   Troponin I <0.03 <0.03 ng/mL   Ct Abdomen Pelvis W Contrast  Result Date: 12/29/2017 CLINICAL DATA:  Generalized abdominal pain for 2 days, no bowel movement. History of total colectomy and Crohn's disease. EXAM: CT ABDOMEN AND PELVIS WITH CONTRAST TECHNIQUE: Multidetector CT imaging of the abdomen and pelvis was performed using the standard protocol following bolus administration of intravenous contrast. CONTRAST:  42m ISOVUE-300 IOPAMIDOL (ISOVUE-300) INJECTION 61%, 1062mISOVUE-300 IOPAMIDOL (ISOVUE-300) INJECTION 61% COMPARISON:  CT abdomen and pelvis March 05, 2014 FINDINGS: LOWER CHEST: Mild dependent atelectasis. Included heart size is normal. No pericardial effusion. HEPATOBILIARY: Liver and gallbladder are normal. PANCREAS: Normal. SPLEEN: Normal. ADRENALS/URINARY TRACT: Kidneys are orthotopic, demonstrating symmetric enhancement. New LEFT upper pole scarring and cortical atrophy. No nephrolithiasis, hydronephrosis or solid renal masses. The unopacified ureters are normal in course and caliber. Delayed imaging through the  kidneys demonstrates symmetric prompt contrast excretion within the proximal urinary collecting system. Urinary bladder is well distended with mild predominately LEFT-sided urinary bladder wall thickening. Stable 18 mm LEFT adrenal nodule. STOMACH/BOWEL: Status post total colectomy. Redundant stool filled small bowel at RIGHT mid abdomen ileostomy. Mildly dilated small bowel to 3.4 cm with transition point at ileostomy. Status post total colectomy. VASCULAR/LYMPHATIC: Aortoiliac vessels are normal in course and caliber. Mild atherosclerosis. No lymphadenopathy by CT size criteria. REPRODUCTIVE: Normal. OTHER: Small amount of free fluid in ileostomy and RIGHT abdomen. MUSCULOSKELETAL: Nonacute. Streak artifact from RIGHT hip total arthroplasty. IMPRESSION: 1. Early versus partial small bowel obstruction with transition point at ileostomy. Status post total colectomy. 2. Small volume ascites.  No drainable fluid collection. 3. Mildly thickened urinary bladder concerning for cystitis. New LEFT renal scarring. Aortic Atherosclerosis (ICD10-I70.0). Electronically Signed   By: CoElon Alas.D.   On: 12/29/2017 18:17    EKG None  EKG Interpretation  Date/Time:  Thursday December 29 2017 14:00:34 EDT Ventricular Rate:  88 PR Interval:    QRS Duration: 86 QT Interval:  356 QTC Calculation: 431 R Axis:   100 Text Interpretation:  Sinus rhythm Right axis deviation Nonspecific repol abnormality, inferior leads No significant change since last tracing Confirmed by Orlie Dakin [phone]) on 12/29/2017 2:32:19 PM      Radiology No results found.  Procedures Procedures (including critical care time)  Medications Ordered in ED Medications  sodium chloride 0.9 % bolus 1,000 mL (1,000 mLs Intravenous New Bag/Given 12/29/17 1351)  morphine 4 MG/ML injection 4 mg (4 mg Intravenous Given 12/29/17 1347)    Results for orders placed or performed during the hospital encounter of 12/29/17  CBC with  Differential  Result Value Ref Range   WBC 7.6 4.0 - 10.5 K/uL   RBC 4.88 4.22 - 5.81 MIL/uL   Hemoglobin 17.0 13.0 - 17.0 g/dL   HCT 49.6 39.0 - 52.0 %   MCV 101.6 (H) 78.0 - 100.0 fL   MCH 34.8 (H) 26.0 - 34.0 pg   MCHC 34.3 30.0 - 36.0 g/dL   RDW 14.9 11.5 - 15.5 %   Platelets 138 (L) 150 - 400 K/uL   Neutrophils Relative % 87 %   Neutro Abs 6.6 1.7 - 7.7 K/uL   Lymphocytes Relative 4 %   Lymphs Abs 0.3 (L) 0.7 - 4.0 K/uL   Monocytes Relative 9 %   Monocytes Absolute 0.7 0.1 - 1.0 K/uL   Eosinophils Relative 0 %   Eosinophils Absolute 0.0 0.0 - 0.7 K/uL   Basophils Relative 0 %   Basophils Absolute 0.0 0.0 - 0.1 K/uL  Comprehensive metabolic panel  Result Value Ref Range   Sodium 137 135 - 145 mmol/L   Potassium 4.2 3.5 - 5.1 mmol/L   Chloride 96 (L) 101 - 111 mmol/L   CO2 23 22 - 32 mmol/L   Glucose, Bld 164 (H) 65 - 99 mg/dL   BUN 19 6 - 20 mg/dL   Creatinine, Ser 1.08 0.61 - 1.24 mg/dL   Calcium 10.4 (H) 8.9 - 10.3 mg/dL   Total Protein 7.7 6.5 - 8.1 g/dL   Albumin 4.0 3.5 - 5.0 g/dL   AST 28 15 - 41 U/L   ALT 52 17 - 63 U/L   Alkaline Phosphatase 72 38 - 126 U/L   Total Bilirubin 2.1 (H) 0.3 - 1.2 mg/dL   GFR calc non Af Amer >60 >60 mL/min   GFR calc Af Amer >60 >60 mL/min   Anion gap 18 (H) 5 - 15  Urinalysis, Routine w reflex microscopic  Result Value Ref Range   Color, Urine YELLOW YELLOW   APPearance HAZY (A) CLEAR   Specific Gravity, Urine 1.017 1.005 - 1.030   pH 5.0 5.0 - 8.0   Glucose, UA NEGATIVE NEGATIVE mg/dL   Hgb urine dipstick SMALL (A) NEGATIVE   Bilirubin Urine NEGATIVE NEGATIVE   Ketones, ur NEGATIVE NEGATIVE mg/dL   Protein, ur NEGATIVE NEGATIVE mg/dL   Nitrite POSITIVE (A) NEGATIVE   Leukocytes, UA LARGE (A) NEGATIVE   RBC / HPF 0-5 0 - 5 RBC/hpf   WBC, UA TOO NUMEROUS TO COUNT 0 - 5 WBC/hpf   Bacteria, UA MANY (A) NONE SEEN   Squamous Epithelial / LPF 0-5 (A) NONE SEEN   Mucus PRESENT    Hyaline Casts, UA PRESENT   Lipase, blood    Result Value Ref Range   Lipase 20 11 -  51 U/L  Troponin I  Result Value Ref Range   Troponin I <0.03 <0.03 ng/mL   Ct Abdomen Pelvis W Contrast  Result Date: 12/29/2017 CLINICAL DATA:  Generalized abdominal pain for 2 days, no bowel movement. History of total colectomy and Crohn's disease. EXAM: CT ABDOMEN AND PELVIS WITH CONTRAST TECHNIQUE: Multidetector CT imaging of the abdomen and pelvis was performed using the standard protocol following bolus administration of intravenous contrast. CONTRAST:  64m ISOVUE-300 IOPAMIDOL (ISOVUE-300) INJECTION 61%, 1020mISOVUE-300 IOPAMIDOL (ISOVUE-300) INJECTION 61% COMPARISON:  CT abdomen and pelvis March 05, 2014 FINDINGS: LOWER CHEST: Mild dependent atelectasis. Included heart size is normal. No pericardial effusion. HEPATOBILIARY: Liver and gallbladder are normal. PANCREAS: Normal. SPLEEN: Normal. ADRENALS/URINARY TRACT: Kidneys are orthotopic, demonstrating symmetric enhancement. New LEFT upper pole scarring and cortical atrophy. No nephrolithiasis, hydronephrosis or solid renal masses. The unopacified ureters are normal in course and caliber. Delayed imaging through the kidneys demonstrates symmetric prompt contrast excretion within the proximal urinary collecting system. Urinary bladder is well distended with mild predominately LEFT-sided urinary bladder wall thickening. Stable 18 mm LEFT adrenal nodule. STOMACH/BOWEL: Status post total colectomy. Redundant stool filled small bowel at RIGHT mid abdomen ileostomy. Mildly dilated small bowel to 3.4 cm with transition point at ileostomy. Status post total colectomy. VASCULAR/LYMPHATIC: Aortoiliac vessels are normal in course and caliber. Mild atherosclerosis. No lymphadenopathy by CT size criteria. REPRODUCTIVE: Normal. OTHER: Small amount of free fluid in ileostomy and RIGHT abdomen. MUSCULOSKELETAL: Nonacute. Streak artifact from RIGHT hip total arthroplasty. IMPRESSION: 1. Early versus partial small bowel  obstruction with transition point at ileostomy. Status post total colectomy. 2. Small volume ascites.  No drainable fluid collection. 3. Mildly thickened urinary bladder concerning for cystitis. New LEFT renal scarring. Aortic Atherosclerosis (ICD10-I70.0). Electronically Signed   By: CoElon Alas.D.   On: 12/29/2017 18:17   Initial Impression / Assessment and Plan / ED Course  I have reviewed the triage vital signs and the nursing notes.  Pertinent labs & imaging results that were available during my care of the patient were reviewed by me and considered in my medical decision making (see chart for details).     220 p.m. pain improved after treatment with intravenous morphine.  4:25 PM requesting more pain medicine.  Additional IV morphine ordered A consult to Dr. BrConstance Hawrom general surgery.  She consulted in the emergency department and request admission to hospitalist service consulted Dr. DaShanon Browrom hospital service who will arrange for admission Final Clinical Impressions(s) / ED Diagnoses  Diagnosis small bowel obstruction Final diagnoses:  None    ED Discharge Orders    None       JaOrlie DakinMD 12/29/17 [id number]

## 2017-12-29 NOTE — ED Triage Notes (Addendum)
Ems reports pt c/o generalized abd pain x 2 days and vomiting.  Pt also reports no bm in 2 days.    Pt hasn't taken any of his home medications.  CBG 181.  Vs stable.

## 2017-12-29 NOTE — H&P (Signed)
History and Physical    GIDEON BURSTEIN NKN:[id number] DOB: Jan 09, 1966 DOA: 12/29/2017  PCP: [name], MD  Patient coming from: Home  Chief Complaint: Abdominal pain  HPI: [name] is a 52 y.o. male with medical history significant of Crohn's disease status post ileostomy, multiple abdominal surgeries, bipolar disorder comes in with several days of worsening periumbilical abdominal pain.  Patient has a history of multiple partial small bowel obstructions in the past.  He denies any fevers.  He has been nauseated and vomiting.  He has not had very many episodes of vomiting however in the ED here.  He reports stool output from his ostomy bag but not much gas.  Patient found to have another partial small bowel obstruction and is referred for admission for such.  General surgery has been consulted and has already seen the patient.   Review of Systems: As per HPI otherwise 10 point review of systems negative.   Past Medical History:  Diagnosis Date  . Arthritis   . Asthma   . 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 Bipolar disorder ([place])   . Colostomy in place [facility])   . Crohn's colitis ([place])    s/p total colectomy with ileostomy in 2009  . Crohn's disease of small intestine [facility]) Sept 2012   ileoscopy: multiple ulcers likely secondary to Crohn's  . DEEP VENOUS THROMBOPHLEBITIS, HX OF 02/11/2009   Qualifier: Diagnosis of  By: Zeb Comfort    . Depression   . DVT (deep venous thrombosis) ([place]) 2009   right upper extremity due to PICC  . Enterococcus UTI 2009  . GERD (gastroesophageal reflux disease)   . Hernia   . Hyperlipidemia   . Insomnia   . Migraine headache   . Nystagmus   . PULMONARY EMBOLISM, HX OF 02/11/2009   Qualifier: Diagnosis of  By: Zeb Comfort    . S/P endoscopy Sept 2012   mild gastritis, small hiatal hernia, no ulcers  . Schizophrenia ([place])   . Schizophrenia, acute [facility])     Past Surgical History:    Procedure Laterality Date  . APPENDECTOMY    . ESOPHAGOGASTRODUODENOSCOPY  07/2010   gastritis,  bx neg for H.Pylori  . ESOPHAGOGASTRODUODENOSCOPY N/A 01/08/2014   SLF:NO SOURCE FOR ODYNOPHAGIA IDENTIFIED/Multiple small ulcers in the gastric antrum  . EYE SURGERY    . ILEOSCOPY  06/29/2011   SLF: ulcers,multiple/small HH/mild gastritis  . KIDNEY SURGERY    . LAPAROTOMY N/A 06/05/2013   Procedure: EXPLORATORY LAPAROTOMY;  Surgeon: Donato Heinz, MD;  Location: [facility];  Service: General;  Laterality: N/A;  . LYSIS OF ADHESION N/A 06/05/2013   Procedure: LYSIS OF ADHESIONS;  Surgeon: Donato Heinz, MD;  Location: [facility];  Service: General;  Laterality: N/A;  . small bowel capsule study  08/2010   few tiny nonbleeding erosions/ulcers ?secondary to relafen or Crohn's. Entire small bowel not seen.   Marland Kitchen TOTAL COLECTOMY  2009   with ileostomy at [facility] for refractory disease   . TOTAL HIP ARTHROPLASTY     right, due to avascular necrosis from chronic prednisone  . TOTAL SHOULDER REPLACEMENT     bilateral     reports that he has been smoking cigarettes.  He has been smoking about 1.00 pack per day. He has never used smokeless tobacco. He reports that he does not drink alcohol or use drugs.  Allergies  Allergen Reactions  . Aspirin  REACTION: unknown reaction  . Bactrim [Sulfamethoxazole-Trimethoprim]     ABD CRAMPS AND DIARRHEA  . Codeine     REACTION: unknown reaction  . Ibuprofen     REACTION: unknown reaction  . Penicillins     REACTION: unknown reaction  . Remicade [Infliximab]     COULDN'T BREATHE  . Tramadol Hcl     REACTION: unknown reaction  . Vicodin [Hydrocodone-Acetaminophen] Nausea Only    No family history on file.  No premature coronary artery disease  Prior to Admission medications   Medication Sig Start Date End Date Taking? Authorizing Provider  acetaminophen (TYLENOL) 500 MG tablet Take 1,000 mg by mouth every 6 (six) hours as needed. For pain    Yes  [provider]  albuterol (PROAIR HFA) 108 (90 BASE) MCG/ACT inhaler Inhale 2 puffs into the lungs every 6 (six) hours as needed for wheezing or shortness of breath.   Yes [provider]  albuterol (PROVENTIL) (2.5 MG/3ML) 0.083% nebulizer solution Take 2.5 mg by nebulization every 6 (six) hours as needed for wheezing or shortness of breath.   Yes [provider]  atorvastatin (LIPITOR) 80 MG tablet Take 80 mg by mouth daily.   Yes [provider]  benztropine (COGENTIN) 0.5 MG tablet Take 0.5 mg by mouth daily.    Yes [provider]  Calcium Carbonate-Vitamin D (CALCIUM 600+D) 600-400 MG-UNIT tablet 1 PO TID AFTER A MEAL 07/13/17  Yes Fields, Sandi L, MD  dicyclomine (BENTYL) 10 MG capsule TAKE 1 OR 2 CAPSULE BY MOUTH AS DIRECTED BEFORE MEALS AND AT BEDTIME. 03/11/17  Yes Annitta Needs, NP  divalproex (DEPAKOTE ER) 250 MG 24 hr tablet Take 250 mg by mouth at bedtime.    Yes [provider]  divalproex (DEPAKOTE) 500 MG 24 hr tablet Take 1,000 mg by mouth every evening.    Yes [provider]  folic acid (FOLVITE) 1 MG tablet Take 1 mg by mouth daily.     Yes [provider]  furosemide (LASIX) 40 MG tablet Take 40 mg by mouth daily.     Yes [provider]  gabapentin (NEURONTIN) 300 MG capsule Take 900 mg by mouth 3 (three) times daily.    Yes [provider]  mercaptopurine (PURINETHOL) 50 MG tablet TAKE 2 TABLETS DAILY ON EMPTY STOMACH 1 HOUR BEFORE OR 2 HOURS AFTER M EAL 11/23/17  Yes Mahala Menghini, PA-C  metFORMIN (GLUCOPHAGE) 500 MG tablet Take by mouth 2 (two) times daily with a meal.   Yes [provider]  mirtazapine (REMERON) 15 MG tablet Take 15 mg by mouth at bedtime.   Yes [provider]  pantoprazole (PROTONIX) 40 MG tablet TAKE (1) TABLET BY MOUTH TWICE A DAY BEFORE MEALS. (BREAKFAST AND SUPPER) 12/16/17  Yes Mahala Menghini, PA-C  potassium chloride SA (K-DUR,KLOR-CON) 20 MEQ  tablet Take 20 mEq by mouth daily.    Yes [provider]  risperiDONE (RISPERDAL) 2 MG tablet Take 2 mg by mouth at bedtime. 3.5 tabs qhs   Yes [provider]  sertraline (ZOLOFT) 50 MG tablet Take 50 mg by mouth daily.   Yes [provider]  tamsulosin (FLOMAX) 0.4 MG CAPS capsule Take 0.4 mg by mouth daily.   Yes [provider]  Incontinence Supply Disposable (PROCARE ADULT BRIEFS LARGE) MISC Use as directed 03/25/16   Fields, Marga Melnick, MD  lidocaine (XYLOCAINE) 2 % solution 2 TSP  PO Q4-6H PRN FOR HEARTBURN OR UPPER ABDOMINAL  PAIN 07/13/17   Danie Binder, MD    Physical Exam: Vitals:   12/29/17 1634 12/29/17 1700 12/29/17 1730 12/29/17 1930  BP: 124/85 136/88 137/86 127/77  Pulse:  95 96 87  Resp:  16 17 18   Temp:      TempSrc:      SpO2:  92% 94% 91%  Weight:      Height:         Constitutional: NAD, calm, comfortable Vitals:   12/29/17 1634 12/29/17 1700 12/29/17 1730 12/29/17 1930  BP: 124/85 136/88 137/86 127/77  Pulse:  95 96 87  Resp:  16 17 18   Temp:      TempSrc:      SpO2:  92% 94% 91%  Weight:      Height:       Eyes: PERRL, lids and conjunctivae normal ENMT: Mucous membranes are moist. Posterior pharynx clear of any exudate or lesions.Normal dentition.  Neck: normal, supple, no masses, no thyromegaly Respiratory: clear to auscultation bilaterally, no wheezing, no crackles. Normal respiratory effort. No accessory muscle use.  Cardiovascular: Regular rate and rhythm, no murmurs / rubs / gallops. No extremity edema. 2+ pedal pulses. No carotid bruits.  Abdomen: no tenderness, no masses palpated. No hepatosplenomegaly. Bowel sounds positive.  Musculoskeletal: no clubbing / cyanosis. No joint deformity upper and lower extremities. Good ROM, no contractures. Normal muscle tone.  Skin: no rashes, lesions, ulcers. No induration Neurologic: CN 2-12 grossly intact. Sensation intact, DTR normal. Strength 5/5 in all 4.  Psychiatric:  Normal judgment and insight. Alert and oriented x 3. Normal mood.    Labs on Admission: I have personally reviewed following labs and imaging studies  CBC: Recent Labs  Lab 12/29/17 1114  WBC 7.6  NEUTROABS 6.6  HGB 17.0  HCT 49.6  MCV 101.6*  PLT 562*   Basic Metabolic Panel: Recent Labs  Lab 12/29/17 1114  NA 137  K 4.2  CL 96*  CO2 23  GLUCOSE 164*  BUN 19  CREATININE 1.08  CALCIUM 10.4*   GFR: Estimated Creatinine Clearance: 86 mL/min (by C-G formula based on SCr of 1.08 mg/dL). Liver Function Tests: Recent Labs  Lab 12/29/17 1114  AST 28  ALT 52  ALKPHOS 72  BILITOT 2.1*  PROT 7.7  ALBUMIN 4.0   Recent Labs  Lab 12/29/17 1343  LIPASE 20   No results for input(s): AMMONIA in the last 168 hours. Coagulation Profile: No results for input(s): INR, PROTIME in the last 168 hours. Cardiac Enzymes: Recent Labs  Lab 12/29/17 1114  TROPONINI <0.03   BNP (last 3 results) No results for input(s): PROBNP in the last 8760 hours. HbA1C: No results for input(s): HGBA1C in the last 72 hours. CBG: No results for input(s): GLUCAP in the last 168 hours. Lipid Profile: No results for input(s): CHOL, HDL, LDLCALC, TRIG, CHOLHDL, LDLDIRECT in the last 72 hours. Thyroid Function Tests: No results for input(s): TSH, T4TOTAL, FREET4, T3FREE, THYROIDAB in the last 72 hours. Anemia Panel: No results for input(s): VITAMINB12, FOLATE, FERRITIN, TIBC, IRON, RETICCTPCT in the last 72 hours. Urine analysis:    Component Value Date/Time   COLORURINE YELLOW 12/29/2017 1140   APPEARANCEUR HAZY (A) 12/29/2017 1140   LABSPEC 1.017 12/29/2017 1140   PHURINE 5.0 12/29/2017 1140   GLUCOSEU NEGATIVE 12/29/2017 1140   HGBUR SMALL (A) 12/29/2017 1140   BILIRUBINUR NEGATIVE 12/29/2017 1140   KETONESUR NEGATIVE 12/29/2017 1140   PROTEINUR NEGATIVE 12/29/2017 1140   UROBILINOGEN 0.2 10/17/2014 1940  NITRITE POSITIVE (A) 12/29/2017 1140   LEUKOCYTESUR LARGE (A) 12/29/2017 1140    Sepsis Labs: !!!!!!!!!!!!!!!!!!!!!!!!!!!!!!!!!!!!!!!!!!!! @LABRCNTIP (procalcitonin:4,lacticidven:4) )No results found for this or any previous visit (from the past 240 hour(s)).   Radiological Exams on Admission: Ct Abdomen Pelvis W Contrast  Result Date: 12/29/2017 CLINICAL DATA:  Generalized abdominal pain for 2 days, no bowel movement. History of total colectomy and Crohn's disease. EXAM: CT ABDOMEN AND PELVIS WITH CONTRAST TECHNIQUE: Multidetector CT imaging of the abdomen and pelvis was performed using the standard protocol following bolus administration of intravenous contrast. CONTRAST:  27m ISOVUE-300 IOPAMIDOL (ISOVUE-300) INJECTION 61%, 1072mISOVUE-300 IOPAMIDOL (ISOVUE-300) INJECTION 61% COMPARISON:  CT abdomen and pelvis March 05, 2014 FINDINGS: LOWER CHEST: Mild dependent atelectasis. Included heart size is normal. No pericardial effusion. HEPATOBILIARY: Liver and gallbladder are normal. PANCREAS: Normal. SPLEEN: Normal. ADRENALS/URINARY TRACT: Kidneys are orthotopic, demonstrating symmetric enhancement. New LEFT upper pole scarring and cortical atrophy. No nephrolithiasis, hydronephrosis or solid renal masses. The unopacified ureters are normal in course and caliber. Delayed imaging through the kidneys demonstrates symmetric prompt contrast excretion within the proximal urinary collecting system. Urinary bladder is well distended with mild predominately LEFT-sided urinary bladder wall thickening. Stable 18 mm LEFT adrenal nodule. STOMACH/BOWEL: Status post total colectomy. Redundant stool filled small bowel at RIGHT mid abdomen ileostomy. Mildly dilated small bowel to 3.4 cm with transition point at ileostomy. Status post total colectomy. VASCULAR/LYMPHATIC: Aortoiliac vessels are normal in course and caliber. Mild atherosclerosis. No lymphadenopathy by CT size criteria. REPRODUCTIVE: Normal. OTHER: Small amount of free fluid in ileostomy and RIGHT abdomen. MUSCULOSKELETAL: Nonacute. Streak  artifact from RIGHT hip total arthroplasty. IMPRESSION: 1. Early versus partial small bowel obstruction with transition point at ileostomy. Status post total colectomy. 2. Small volume ascites.  No drainable fluid collection. 3. Mildly thickened urinary bladder concerning for cystitis. New LEFT renal scarring. Aortic Atherosclerosis (ICD10-I70.0). Electronically Signed   By: CoElon Alas.D.   On: 12/29/2017 18:17    Old chart reviewed Case discussed with EDP  Assessment/Plan [age]ear old male with recurrent partial small bowel obstruction with a history of Crohn's disease  Principal Problem:   SBO (small bowel obstruction) (HCC)-partial-conservative management with NG tube.  General surgery following.  N.p.o. except meds.  IV fluids overnight.  Further management per surgery team.  Hold anticoagulation at this time.  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

## 2017-12-30 ENCOUNTER — Other Ambulatory Visit: Payer: Self-pay

## 2017-12-30 DIAGNOSIS — K50018 Crohn's disease of small intestine with other complication: Secondary | ICD-10-CM

## 2017-12-30 LAB — CBC
HEMATOCRIT: 45.3 % (ref 39.0–52.0)
HEMOGLOBIN: 15.1 g/dL (ref 13.0–17.0)
MCH: 34.3 pg — ABNORMAL HIGH (ref 26.0–34.0)
MCHC: 33.3 g/dL (ref 30.0–36.0)
MCV: 103 fL — ABNORMAL HIGH (ref 78.0–100.0)
PLATELETS: 120 10*3/uL — AB (ref 150–400)
RBC: 4.4 MIL/uL (ref 4.22–5.81)
RDW: 15.1 % (ref 11.5–15.5)
WBC: 3.2 10*3/uL — AB (ref 4.0–10.5)

## 2017-12-30 LAB — BASIC METABOLIC PANEL
ANION GAP: 13 (ref 5–15)
BUN: 18 mg/dL (ref 6–20)
CHLORIDE: 99 mmol/L — AB (ref 101–111)
CO2: 24 mmol/L (ref 22–32)
Calcium: 8.7 mg/dL — ABNORMAL LOW (ref 8.9–10.3)
Creatinine, Ser: 0.81 mg/dL (ref 0.61–1.24)
GFR calc non Af Amer: >60 mL/min (ref >60)
Glucose, Bld: 111 mg/dL — ABNORMAL HIGH (ref 65–99)
POTASSIUM: 4.7 mmol/L (ref 3.5–5.1)
SODIUM: 136 mmol/L (ref 135–145)

## 2017-12-30 MED ORDER — SODIUM CHLORIDE 0.9 % IV SOLN
INTRAVENOUS | Status: AC
  Administered 2017-12-30: 10:00:00 via INTRAVENOUS

## 2017-12-30 MED ORDER — CHLORHEXIDINE GLUCONATE 0.12 % MT SOLN: 15.0000 mL | Freq: Two times a day (BID) | OROMUCOSAL | Status: DC

## 2017-12-30 MED ORDER — ORAL CARE MOUTH RINSE
15.0000 mL | Freq: Two times a day (BID) | OROMUCOSAL | Status: DC
  Administered 2017-12-30: 15 mL via OROMUCOSAL

## 2017-12-30 NOTE — Progress Notes (Signed)
PROGRESS NOTE    [name]  ULA:[id number] DOB: 1966-02-16 DOA: 12/29/2017 PCP: [name], MD     Assessment & Plan:   Principal Problem:   SBO (small bowel obstruction) partial (HCC)-improving.  Patient making brown stool in his colostomy bag with gas.  NG tube in place.  General surgery is following.  Further management per general surgery. Active Problems:   BIPOLAR AFFECTIVE DISORDER-stable   Crohn's disease of small intestine (HCC)-stable   Para-ileostomy hernia (HCC)-noted.  Abdominal exam is benign.     DVT prophylaxis: SCDs  code Status: Full  family Communication: None  disposition Plan: Several days Consultants:   *General surgery   Subjective: Patient feeling better with less abdominal pain  Objective: Vitals:   12/29/17 1930 12/29/17 2100 12/29/17 2213 12/30/17 0627  BP: 127/77 122/85 135/80 125/75  Pulse: 87 95 92 92  Resp: 18 18 18 18   Temp:   97.9 F (36.6 C) 98 F (36.7 C)  TempSrc:   Oral Oral  SpO2: 91% 95% 93% 94%  Weight:   92 kg (202 lb 13.2 oz)   Height:   5' 6"  (1.676 m)     Intake/Output Summary (Last 24 hours) at 12/30/2017 1117 Last data filed at 12/30/2017 0943 Gross per 24 hour  Intake 1000 ml  Output 950 ml  Net 50 ml   Filed Weights   12/29/17 1100 12/29/17 2213  Weight: 94.3 kg (208 lb) 92 kg (202 lb 13.2 oz)    Examination:  General exam: Appears calm and comfortable  Respiratory system: Clear to auscultation. Respiratory effort normal. Cardiovascular system: S1 & S2 heard, RRR. No JVD, murmurs, rubs, gallops or clicks. No pedal edema. Gastrointestinal system: Abdomen is nondistended, soft and nontender. No organomegaly or masses felt. Normal bowel sounds heard. Central nervous system: Alert and oriented. No focal neurological deficits. Extremities: Symmetric 5 x 5 power. Skin: No rashes, lesions or ulcers Psychiatry: Judgement and insight appear normal. Mood & affect appropriate.     Data Reviewed: I  have personally reviewed following labs and imaging studies  CBC: Recent Labs  Lab 12/29/17 1114 12/30/17 0604  WBC 7.6 3.2*  NEUTROABS 6.6  --   HGB 17.0 15.1  HCT 49.6 45.3  MCV 101.6* 103.0*  PLT 138* 212*   Basic Metabolic Panel: Recent Labs  Lab 12/29/17 1114 12/30/17 0604  NA 137 136  K 4.2 4.7  CL 96* 99*  CO2 23 24  GLUCOSE 164* 111*  BUN 19 18  CREATININE 1.08 0.81  CALCIUM 10.4* 8.7*   GFR: Estimated Creatinine Clearance: 113.3 mL/min (by C-G formula based on SCr of 0.81 mg/dL). Liver Function Tests: Recent Labs  Lab 12/29/17 1114  AST 28  ALT 52  ALKPHOS 72  BILITOT 2.1*  PROT 7.7  ALBUMIN 4.0   Recent Labs  Lab 12/29/17 1343  LIPASE 20   No results for input(s): AMMONIA in the last 168 hours. Coagulation Profile: No results for input(s): INR, PROTIME in the last 168 hours. Cardiac Enzymes: Recent Labs  Lab 12/29/17 1114  TROPONINI <0.03   BNP (last 3 results) No results for input(s): PROBNP in the last 8760 hours. HbA1C: No results for input(s): HGBA1C in the last 72 hours. CBG: No results for input(s): GLUCAP in the last 168 hours. Lipid Profile: No results for input(s): CHOL, HDL, LDLCALC, TRIG, CHOLHDL, LDLDIRECT in the last 72 hours. Thyroid Function Tests: No results for input(s): TSH, T4TOTAL, FREET4, T3FREE, THYROIDAB in the last 72 hours.  Anemia Panel: No results for input(s): VITAMINB12, FOLATE, FERRITIN, TIBC, IRON, RETICCTPCT in the last 72 hours. Sepsis Labs: No results for input(s): PROCALCITON, LATICACIDVEN in the last 168 hours.  No results found for this or any previous visit (from the past 240 hour(s)).       Radiology Studies: Ct Abdomen Pelvis W Contrast  Result Date: 12/29/2017 CLINICAL DATA:  Generalized abdominal pain for 2 days, no bowel movement. History of total colectomy and Crohn's disease. EXAM: CT ABDOMEN AND PELVIS WITH CONTRAST TECHNIQUE: Multidetector CT imaging of the abdomen and pelvis was  performed using the standard protocol following bolus administration of intravenous contrast. CONTRAST:  64m ISOVUE-300 IOPAMIDOL (ISOVUE-300) INJECTION 61%, 1072mISOVUE-300 IOPAMIDOL (ISOVUE-300) INJECTION 61% COMPARISON:  CT abdomen and pelvis March 05, 2014 FINDINGS: LOWER CHEST: Mild dependent atelectasis. Included heart size is normal. No pericardial effusion. HEPATOBILIARY: Liver and gallbladder are normal. PANCREAS: Normal. SPLEEN: Normal. ADRENALS/URINARY TRACT: Kidneys are orthotopic, demonstrating symmetric enhancement. New LEFT upper pole scarring and cortical atrophy. No nephrolithiasis, hydronephrosis or solid renal masses. The unopacified ureters are normal in course and caliber. Delayed imaging through the kidneys demonstrates symmetric prompt contrast excretion within the proximal urinary collecting system. Urinary bladder is well distended with mild predominately LEFT-sided urinary bladder wall thickening. Stable 18 mm LEFT adrenal nodule. STOMACH/BOWEL: Status post total colectomy. Redundant stool filled small bowel at RIGHT mid abdomen ileostomy. Mildly dilated small bowel to 3.4 cm with transition point at ileostomy. Status post total colectomy. VASCULAR/LYMPHATIC: Aortoiliac vessels are normal in course and caliber. Mild atherosclerosis. No lymphadenopathy by CT size criteria. REPRODUCTIVE: Normal. OTHER: Small amount of free fluid in ileostomy and RIGHT abdomen. MUSCULOSKELETAL: Nonacute. Streak artifact from RIGHT hip total arthroplasty. IMPRESSION: 1. Early versus partial small bowel obstruction with transition point at ileostomy. Status post total colectomy. 2. Small volume ascites.  No drainable fluid collection. 3. Mildly thickened urinary bladder concerning for cystitis. New LEFT renal scarring. Aortic Atherosclerosis (ICD10-I70.0). Electronically Signed   By: CoElon Alas.D.   On: 12/29/2017 18:17   Dg Chest Portable 1 View  Result Date: 12/29/2017 CLINICAL DATA:  Nasogastric  tube placement EXAM: PORTABLE CHEST 1 VIEW COMPARISON:  October 27, 2017 FINDINGS: Nasogastric tube tip is below the diaphragm. Central catheter tip is in the superior vena cava. No pneumothorax. Lungs are clear. Heart size and pulmonary vascular normal. There are total shoulder replacements bilaterally. IMPRESSION: Nasogastric tip not seen. Nasogastric tube tip appears below the diaphragm. Port-A-Cath tip in superior vena cava. No pneumothorax. Lungs clear. Electronically Signed   By: WiLowella GripII M.D.   On: 12/29/2017 20:29   Dg Chest Port 1v Same Day  Result Date: 12/29/2017 CLINICAL DATA:  89ear old male status post NG tube adjustment. EXAM: PORTABLE CHEST 1 VIEW COMPARISON:  2011 hours today. FINDINGS: Portable AP upright view at 2100 hours. The NG tube is looped in the left upper quadrant compatible with placement into the stomach with some redundancy. The mediastinal contour remains normal. Stable right chest porta cath. Lung parenchyma is stable and clear when allowing for portable technique. Paucity of bowel gas in the upper abdomen. No pneumoperitoneum identified. Stable visualized osseous structures. IMPRESSION: 1. NG tube satisfactorily placed, looped in the stomach. 2.  No acute cardiopulmonary abnormality. Electronically Signed   By: H Genevie Ann.D.   On: 12/29/2017 21:21        Scheduled Meds: . benztropine  0.5 mg Oral Daily  . chlorhexidine  15 mL Mouth Rinse BID  .  divalproex  1,000 mg Oral QHS  . divalproex  250 mg Oral Daily  . mouth rinse  15 mL Mouth Rinse q12n4p  . mirtazapine  15 mg Oral QHS   Continuous Infusions: . sodium chloride 75 mL/hr at 12/30/17 1012     LOS: 1 day       DAVID,RACHAL A, MD [facility] Pager [phone]  If 7PM-7AM, please contact night-coverage [email or web address] Password [facility] 12/30/2017, 11:17 AM  Patient ID: [name], male   DOB: 1966-03-08, 52 y.o.   MRN: [id number]

## 2017-12-30 NOTE — Progress Notes (Signed)
[facility] Progress Note     Subjective: Ileostomy with output. NG with minimal. Bag changed and 375 in bag when changed.   Objective: Vital signs in last 24 hours: Temp:  [97.9 F (36.6 C)-98.3 F (36.8 C)] 98.3 F (36.8 C) (03/29 1311) Pulse Rate:  [87-96] 87 (03/29 1311) Resp:  [16-18] 17 (03/29 1311) BP: (111-137)/(60-88) 111/60 (03/29 1311) SpO2:  [91 %-95 %] 95 % (03/29 1311) Weight:  [202 lb 13.2 oz (92 kg)] 202 lb 13.2 oz (92 kg) (03/28 2213) Last BM Date: 12/30/17  Intake/Output from previous day: 03/28 0701 - 03/29 0700 In: 1000 [IV Piggyback:1000] Out: 400 [Urine:400] Intake/Output this shift: Total I/O In: -  Out: 1525 [Urine:500; Stool:1025]  General appearance: alert, cooperative and no distress Resp: normal work breating GI: ostomy pink, digitalized down to fascia, could not get below fascia, has parastomal hernia, gas and liquid in bag  Lab Results:  Recent Labs    12/29/17 1114 12/30/17 0604  WBC 7.6 3.2*  HGB 17.0 15.1  HCT 49.6 45.3  PLT 138* 120*   BMET Recent Labs    12/29/17 1114 12/30/17 0604  NA 137 136  K 4.2 4.7  CL 96* 99*  CO2 23 24  GLUCOSE 164* 111*  BUN 19 18  CREATININE 1.08 0.81  CALCIUM 10.4* 8.7*   PT/INR No results for input(s): LABPROT, INR in the last 72 hours.  Studies/Results: Ct Abdomen Pelvis W Contrast  Result Date: 12/29/2017 CLINICAL DATA:  Generalized abdominal pain for 2 days, no bowel movement. History of total colectomy and Crohn's disease. EXAM: CT ABDOMEN AND PELVIS WITH CONTRAST TECHNIQUE: Multidetector CT imaging of the abdomen and pelvis was performed using the standard protocol following bolus administration of intravenous contrast. CONTRAST:  73m ISOVUE-300 IOPAMIDOL (ISOVUE-300) INJECTION 61%, 108mISOVUE-300 IOPAMIDOL (ISOVUE-300) INJECTION 61% COMPARISON:  CT abdomen and pelvis March 05, 2014 FINDINGS: LOWER CHEST: Mild dependent atelectasis. Included heart size is normal. No  pericardial effusion. HEPATOBILIARY: Liver and gallbladder are normal. PANCREAS: Normal. SPLEEN: Normal. ADRENALS/URINARY TRACT: Kidneys are orthotopic, demonstrating symmetric enhancement. New LEFT upper pole scarring and cortical atrophy. No nephrolithiasis, hydronephrosis or solid renal masses. The unopacified ureters are normal in course and caliber. Delayed imaging through the kidneys demonstrates symmetric prompt contrast excretion within the proximal urinary collecting system. Urinary bladder is well distended with mild predominately LEFT-sided urinary bladder wall thickening. Stable 18 mm LEFT adrenal nodule. STOMACH/BOWEL: Status post total colectomy. Redundant stool filled small bowel at RIGHT mid abdomen ileostomy. Mildly dilated small bowel to 3.4 cm with transition point at ileostomy. Status post total colectomy. VASCULAR/LYMPHATIC: Aortoiliac vessels are normal in course and caliber. Mild atherosclerosis. No lymphadenopathy by CT size criteria. REPRODUCTIVE: Normal. OTHER: Small amount of free fluid in ileostomy and RIGHT abdomen. MUSCULOSKELETAL: Nonacute. Streak artifact from RIGHT hip total arthroplasty. IMPRESSION: 1. Early versus partial small bowel obstruction with transition point at ileostomy. Status post total colectomy. 2. Small volume ascites.  No drainable fluid collection. 3. Mildly thickened urinary bladder concerning for cystitis. New LEFT renal scarring. Aortic Atherosclerosis (ICD10-I70.0). Electronically Signed   By: CoElon Alas.D.   On: 12/29/2017 18:17   Dg Chest Portable 1 View  Result Date: 12/29/2017 CLINICAL DATA:  Nasogastric tube placement EXAM: PORTABLE CHEST 1 VIEW COMPARISON:  October 27, 2017 FINDINGS: Nasogastric tube tip is below the diaphragm. Central catheter tip is in the superior vena cava. No pneumothorax. Lungs are clear. Heart size and pulmonary vascular normal. There are total shoulder  replacements bilaterally. IMPRESSION: Nasogastric tip not seen.  Nasogastric tube tip appears below the diaphragm. Port-A-Cath tip in superior vena cava. No pneumothorax. Lungs clear. Electronically Signed   By: Lowella Grip III M.D.   On: 12/29/2017 20:29   Dg Chest Port 1v Same Day  Result Date: 12/29/2017 CLINICAL DATA:  52 year old male status post NG tube adjustment. EXAM: PORTABLE CHEST 1 VIEW COMPARISON:  2011 hours today. FINDINGS: Portable AP upright view at 2100 hours. The NG tube is looped in the left upper quadrant compatible with placement into the stomach with some redundancy. The mediastinal contour remains normal. Stable right chest porta cath. Lung parenchyma is stable and clear when allowing for portable technique. Paucity of bowel gas in the upper abdomen. No pneumoperitoneum identified. Stable visualized osseous structures. IMPRESSION: 1. NG tube satisfactorily placed, looped in the stomach. 2.  No acute cardiopulmonary abnormality. Electronically Signed   By: Genevie Ann M.D.   On: 12/29/2017 21:21    Assessment/Plan: Mr. Gillis Ends is a 52 yo with resolving SBO at the level of the fascia prior to going into the parastomal hernia. The ileostomy is making liquid now.  NG with minimal output. Digitalized down to almost the fascia without issues.  -NG out -Clear liquids, told him to go slow   LOS: 1 day    [name] 12/30/2017

## 2017-12-31 DIAGNOSIS — K5000 Crohn's disease of small intestine without complications: Secondary | ICD-10-CM

## 2017-12-31 DIAGNOSIS — F319 Bipolar disorder, unspecified: Secondary | ICD-10-CM

## 2017-12-31 DIAGNOSIS — N390 Urinary tract infection, site not specified: Secondary | ICD-10-CM | POA: Diagnosis present

## 2017-12-31 DIAGNOSIS — K9419 Other complications of enterostomy: Secondary | ICD-10-CM

## 2017-12-31 MED ORDER — CIPROFLOXACIN HCL 250 MG PO TABS: 500.0000 mg | ORAL_TABLET | Freq: Two times a day (BID) | ORAL | Status: DC

## 2017-12-31 MED ORDER — CIPROFLOXACIN HCL 500 MG PO TABS: 500.0000 mg | ORAL_TABLET | Freq: Two times a day (BID) | ORAL | 0 refills | Status: AC

## 2017-12-31 NOTE — Progress Notes (Signed)
[facility] Progress Note     Subjective: No major issues. Having stool from ostomy. Tolerating liquids.   Objective: Vital signs in last 24 hours: Temp:  [98.1 F (36.7 C)-98.3 F (36.8 C)] 98.2 F (36.8 C) (03/30 0620) Pulse Rate:  [64-87] 64 (03/30 0620) Resp:  [17-20] 20 (03/30 0620) BP: (107-111)/(60-69) 107/69 (03/30 0620) SpO2:  [93 %-99 %] 99 % (03/30 0620) Last BM Date: 12/30/17  Intake/Output from previous day: 03/29 0701 - 03/30 0700 In: 697.5 [P.O.:360; I.V.:337.5] Out: 3225 [Urine:1750; Emesis/NG output:50; MCNOB:0962] Intake/Output this shift: No intake/output data recorded.  General appearance: alert, cooperative and no distress Resp: normal work breathing GI: gas and liquid in the bag, somewhat tender around ostomy site/ hernia, no rebound or guarding, soft  Lab Results:  Recent Labs    12/29/17 1114 12/30/17 0604  WBC 7.6 3.2*  HGB 17.0 15.1  HCT 49.6 45.3  PLT 138* 120*   BMET Recent Labs    12/29/17 1114 12/30/17 0604  NA 137 136  K 4.2 4.7  CL 96* 99*  CO2 23 24  GLUCOSE 164* 111*  BUN 19 18  CREATININE 1.08 0.81  CALCIUM 10.4* 8.7*   PT/INR No results for input(s): LABPROT, INR in the last 72 hours.  Studies/Results: Ct Abdomen Pelvis W Contrast  Result Date: 12/29/2017 CLINICAL DATA:  Generalized abdominal pain for 2 days, no bowel movement. History of total colectomy and Crohn's disease. EXAM: CT ABDOMEN AND PELVIS WITH CONTRAST TECHNIQUE: Multidetector CT imaging of the abdomen and pelvis was performed using the standard protocol following bolus administration of intravenous contrast. CONTRAST:  38m ISOVUE-300 IOPAMIDOL (ISOVUE-300) INJECTION 61%, 10100mISOVUE-300 IOPAMIDOL (ISOVUE-300) INJECTION 61% COMPARISON:  CT abdomen and pelvis March 05, 2014 FINDINGS: LOWER CHEST: Mild dependent atelectasis. Included heart size is normal. No pericardial effusion. HEPATOBILIARY: Liver and gallbladder are normal. PANCREAS:  Normal. SPLEEN: Normal. ADRENALS/URINARY TRACT: Kidneys are orthotopic, demonstrating symmetric enhancement. New LEFT upper pole scarring and cortical atrophy. No nephrolithiasis, hydronephrosis or solid renal masses. The unopacified ureters are normal in course and caliber. Delayed imaging through the kidneys demonstrates symmetric prompt contrast excretion within the proximal urinary collecting system. Urinary bladder is well distended with mild predominately LEFT-sided urinary bladder wall thickening. Stable 18 mm LEFT adrenal nodule. STOMACH/BOWEL: Status post total colectomy. Redundant stool filled small bowel at RIGHT mid abdomen ileostomy. Mildly dilated small bowel to 3.4 cm with transition point at ileostomy. Status post total colectomy. VASCULAR/LYMPHATIC: Aortoiliac vessels are normal in course and caliber. Mild atherosclerosis. No lymphadenopathy by CT size criteria. REPRODUCTIVE: Normal. OTHER: Small amount of free fluid in ileostomy and RIGHT abdomen. MUSCULOSKELETAL: Nonacute. Streak artifact from RIGHT hip total arthroplasty. IMPRESSION: 1. Early versus partial small bowel obstruction with transition point at ileostomy. Status post total colectomy. 2. Small volume ascites.  No drainable fluid collection. 3. Mildly thickened urinary bladder concerning for cystitis. New LEFT renal scarring. Aortic Atherosclerosis (ICD10-I70.0). Electronically Signed   By: CoElon Alas.D.   On: 12/29/2017 18:17   Dg Chest Portable 1 View  Result Date: 12/29/2017 CLINICAL DATA:  Nasogastric tube placement EXAM: PORTABLE CHEST 1 VIEW COMPARISON:  October 27, 2017 FINDINGS: Nasogastric tube tip is below the diaphragm. Central catheter tip is in the superior vena cava. No pneumothorax. Lungs are clear. Heart size and pulmonary vascular normal. There are total shoulder replacements bilaterally. IMPRESSION: Nasogastric tip not seen. Nasogastric tube tip appears below the diaphragm. Port-A-Cath tip in superior vena  cava. No pneumothorax. Lungs clear.  Electronically Signed   By: Lowella Grip III M.D.   On: 12/29/2017 20:29   Dg Chest Port 1v Same Day  Result Date: 12/29/2017 CLINICAL DATA:  53 year old male status post NG tube adjustment. EXAM: PORTABLE CHEST 1 VIEW COMPARISON:  2011 hours today. FINDINGS: Portable AP upright view at 2100 hours. The NG tube is looped in the left upper quadrant compatible with placement into the stomach with some redundancy. The mediastinal contour remains normal. Stable right chest porta cath. Lung parenchyma is stable and clear when allowing for portable technique. Paucity of bowel gas in the upper abdomen. No pneumoperitoneum identified. Stable visualized osseous structures. IMPRESSION: 1. NG tube satisfactorily placed, looped in the stomach. 2.  No acute cardiopulmonary abnormality. Electronically Signed   By: Genevie Ann M.D.   On: 12/29/2017 21:21    Assessment/Plan: Mr Baiz is a 52 yo with resolving SBO with history of crohn's s/p end ileostomy with chronic parastomal hernia. Doing better. Soft diet Adv as tolerated Can d/c home as soon as tolerates diet  Follow up with PCP    LOS: 2 days    Virl Cagey 12/31/2017

## 2017-12-31 NOTE — Discharge Instructions (Signed)
PLEASE CONTINUE SOFT DIET FOR NEXT 5 DAYS.  RETURN IF SYMPTOMS RECUR, WORSEN OR NEW PROBLEM DEVELOPS.   Follow with Primary MD  [name], MD  and other consultant's as instructed your Hospitalist MD  Please get a complete blood count and chemistry panel checked by your Primary MD at your next visit, and again as instructed by your Primary MD.  Get Medicines reviewed and adjusted: Please take all your medications with you for your next visit with your Primary MD  Laboratory/radiological data: Please request your Primary MD to go over all hospital tests and procedure/radiological results at the follow up, please ask your Primary MD to get all Hospital records sent to his/her office.  In some cases, they will be blood work, cultures and biopsy results pending at the time of your discharge. Please request that your primary care M.D. follows up on these results.  Also Note the following: If you experience worsening of your admission symptoms, develop shortness of breath, life threatening emergency, suicidal or homicidal thoughts you must seek medical attention immediately by calling 911 or calling your MD immediately  if symptoms less severe.  You must read complete instructions/literature along with all the possible adverse reactions/side effects for all the Medicines you take and that have been prescribed to you. Take any new Medicines after you have completely understood and accpet all the possible adverse reactions/side effects.   Do not drive when taking Pain medications or sleeping medications (Benzodaizepines)  Do not take more than prescribed Pain, Sleep and Anxiety Medications. It is not advisable to combine anxiety,sleep and pain medications without talking with your primary care practitioner  Special Instructions: If you have smoked or chewed Tobacco  in the last 2 yrs please stop smoking, stop any regular Alcohol  and or any Recreational drug use.  Wear Seat belts while  driving.  Please note: You were cared for by a hospitalist during your hospital stay. Once you are discharged, your primary care physician will handle any further medical issues. Please note that NO REFILLS for any discharge medications will be authorized once you are discharged, as it is imperative that you return to your primary care physician (or establish a relationship with a primary care physician if you do not have one) for your post hospital discharge needs so that they can reassess your need for medications and monitor your lab values.    Soft-Food Meal Plan A soft-food meal plan includes foods that are safe and easy to swallow. This meal plan typically is used:  If you are having trouble chewing or swallowing foods.  As a transition meal plan after only having had liquid meals for a long period.  What do I need to know about the soft-food meal plan? A soft-food meal plan includes tender foods that are soft and easy to chew and swallow. In most cases, bite-sized pieces of food are easier to swallow. A bite-sized piece is about  inch or smaller. Foods in this plan do not need to be ground or pureed. Foods that are very hard, crunchy, or sticky should be avoided. Also, breads, cereals, yogurts, and desserts with nuts, seeds, or fruits should be avoided. What foods can I eat? Grains Rice and wild rice. Moist bread, dressing, pasta, and noodles. Well-moistened dry or cooked cereals, such as farina (cooked wheat cereal), oatmeal, or grits. Biscuits, breads, muffins, pancakes, and waffles that have been well moistened. Vegetables Shredded lettuce. Cooked, tender vegetables, including potatoes without skins. Vegetable juices. Broths or  creamed soups made with vegetables that are not stringy or chewy. Strained tomatoes (without seeds). Fruits Canned or well-cooked fruits. Soft (ripe), peeled fresh fruits, such as peaches, nectarines, kiwi, cantaloupe, honeydew melon, and watermelon (without  seeds). Soft berries with small seeds, such as strawberries. Fruit juices (without pulp). Meats and Other Protein Sources Moist, tender, lean beef. Mutton. Lamb. Veal. Chicken. [place]. Liver. Ham. Fish without bones. Eggs. Dairy Milk, milk drinks, and cream. Plain cream cheese and cottage cheese. Plain yogurt. Sweets/Desserts Flavored gelatin desserts. Custard. Plain ice cream, frozen yogurt, sherbet, milk shakes, and malts. Plain cakes and cookies. Plain hard candy. Other Butter, margarine (without trans fat), and cooking oils. Mayonnaise. Cream sauces. Mild spices, salt, and sugar. Syrup, molasses, honey, and jelly. The items listed above may not be a complete list of recommended foods or beverages. Contact your dietitian for more options. What foods are not recommended? Grains Dry bread, toast, crackers that have not been moistened. Coarse or dry cereals, such as bran, granola, and shredded wheat. Tough or chewy crusty breads, such as [place] bread or baguettes. Vegetables Corn. Raw vegetables except shredded lettuce. Cooked vegetables that are tough or stringy. Tough, crisp, fried potatoes and potato skins. Fruits Fresh fruits with skins or seeds or both, such as apples, pears, or grapes. Stringy, high-pulp fruits, such as papaya, pineapple, coconut, or mango. Fruit leather, fruit roll-ups, and all dried fruits. Meats and Other Protein Sources Sausages and hot dogs. Meats with gristle. Fish with bones. Nuts, seeds, and chunky peanut or other nut butters. Sweets/Desserts Cakes or cookies that are very dry or chewy. The items listed above may not be a complete list of foods and beverages to avoid. Contact your dietitian for more information. This information is not intended to replace advice given to you by your health care provider. Make sure you discuss any questions you have with your health care provider. Document Released: 12/28/2007 Document Revised: 02/26/2016 Document Reviewed:  08/17/2013 Elsevier Interactive Patient Education  2017 [employer].    Urinary Tract Infection, Adult A urinary tract infection (UTI) is an infection of any part of the urinary tract. The urinary tract includes the:  Kidneys.  Ureters.  Bladder.  Urethra.  These organs make, store, and get rid of pee (urine) in the body. Follow these instructions at home:  Take over-the-counter and prescription medicines only as told by your doctor.  If you were prescribed an antibiotic medicine, take it as told by your doctor. Do not stop taking the antibiotic even if you start to feel better.  Avoid the following drinks: ? Alcohol. ? Caffeine. ? Tea. ? Carbonated drinks.  Drink enough fluid to keep your pee clear or pale yellow.  Keep all follow-up visits as told by your doctor. This is important.  Make sure to: ? Empty your bladder often and completely. Do not to hold pee for long periods of time. ? Empty your bladder before and after sex. ? Wipe from front to back after a bowel movement if you are male. Use each tissue one time when you wipe. Contact a doctor if:  You have back pain.  You have a fever.  You feel sick to your stomach (nauseous).  You throw up (vomit).  Your symptoms do not get better after 3 days.  Your symptoms go away and then come back. Get help right away if:  You have very bad back pain.  You have very bad lower belly (abdominal) pain.  You are throwing up and  cannot keep down any medicines or water. This information is not intended to replace advice given to you by your health care provider. Make sure you discuss any questions you have with your health care provider. Document Released: 03/08/2008 Document Revised: 02/26/2016 Document Reviewed: 08/11/2015 Elsevier Interactive Patient Education  [employer].

## 2017-12-31 NOTE — Discharge Summary (Signed)
Physician Discharge Summary  [name] SKA:[id number] DOB: 1966-03-03 DOA: 12/29/2017  PCP: [name], MD  Admit date: 12/29/2017 Discharge date: 12/31/2017  Admitted From: Home  Disposition: Home  Recommendations for Outpatient Follow-up:  1. Follow up with PCP in 3-7 days 2. Please obtain BMP/CBC in one week 3. Please follow up on the following pending results:Final culture data  Discharge Condition: STABLE   CODE STATUS: FULL    Brief Hospitalization Summary: Please see all hospital notes, images, labs for full details of the hospitalization. FROM HPI: [name] is a 52 y.o. male with medical history significant of Crohn's disease status post ileostomy, multiple abdominal surgeries, bipolar disorder comes in with several days of worsening periumbilical abdominal pain.  Patient has a history of multiple partial small bowel obstructions in the past.  He denies any fevers.  He has been nauseated and vomiting.  He has not had very many episodes of vomiting however in the ED here.  He reports stool output from his ostomy bag but not much gas.  Patient found to have another partial small bowel obstruction and is referred for admission for such.  General surgery has been consulted and has already seen the patient. [name] is a 52 y.o. male with pSBO at the level of the small bowel just prior to entry into the subcutaneous tissue, appears partial due to having some stool in bag.  Abdominal exam with generalized tenderness but soft and no guarding.  -NG/ NPO, surgery was consulted to digitalize ostomy as his bag is not one that can be opened.  NG with minimal output and was discontinued. Digitalized down to almost the fascia without issues.  The patient improved and tolerated soft diet without difficulty.  He is stable to discharge per surgery team.  Will discharge home today.  Pt also positive for UTI and will be treated with ciprofloxacin.  Follow up with PCP for recheck in 3-7  days.   Discharge Diagnoses:  Principal Problem:   SBO (small bowel obstruction) (HCC) Active Problems:   BIPOLAR AFFECTIVE DISORDER   Crohn's disease of small intestine (HCC)   Para-ileostomy hernia (HCC)   Discharge Instructions: Discharge Instructions    Call MD for:  extreme fatigue   Complete by:  As directed    Call MD for:  persistant dizziness or light-headedness   Complete by:  As directed    Call MD for:  persistant nausea and vomiting   Complete by:  As directed    Call MD for:  severe uncontrolled pain   Complete by:  As directed    Diet - low sodium heart healthy   Complete by:  As directed    Increase activity slowly   Complete by:  As directed      Allergies as of 12/31/2017      Reactions   Aspirin    REACTION: unknown reaction   Bactrim [sulfamethoxazole-trimethoprim]    ABD CRAMPS AND DIARRHEA   Codeine    REACTION: unknown reaction   Ibuprofen    REACTION: unknown reaction   Penicillins    REACTION: unknown reaction   Remicade [infliximab]    COULDN'T BREATHE   Tramadol Hcl    REACTION: unknown reaction   Vicodin [hydrocodone-acetaminophen] Nausea Only      Medication List    TAKE these medications   acetaminophen 500 MG tablet Commonly known as:  TYLENOL Take 1,000 mg by mouth every 6 (six) hours as needed. For pain  atorvastatin 80 MG tablet Commonly known as:  LIPITOR Take 80 mg by mouth daily.   benztropine 0.5 MG tablet Commonly known as:  COGENTIN Take 0.5 mg by mouth daily.   Calcium Carbonate-Vitamin D 600-400 MG-UNIT tablet Commonly known as:  CALCIUM 600+D 1 PO TID AFTER A MEAL   ciprofloxacin 500 MG tablet Commonly known as:  CIPRO Take 1 tablet (500 mg total) by mouth 2 (two) times daily for 7 days.   dicyclomine 10 MG capsule Commonly known as:  BENTYL TAKE 1 OR 2 CAPSULE BY MOUTH AS DIRECTED BEFORE MEALS AND AT BEDTIME.   divalproex 500 MG 24 hr tablet Commonly known as:  DEPAKOTE ER Take 1,000 mg by mouth  every evening.   divalproex 250 MG 24 hr tablet Commonly known as:  DEPAKOTE ER Take 250 mg by mouth daily.   folic acid 1 MG tablet Commonly known as:  FOLVITE Take 1 mg by mouth daily.   furosemide 40 MG tablet Commonly known as:  LASIX Take 40 mg by mouth daily.   gabapentin 300 MG capsule Commonly known as:  NEURONTIN Take 900 mg by mouth 3 (three) times daily.   lidocaine 2 % solution Commonly known as:  XYLOCAINE 2 TSP  PO Q4-6H PRN FOR HEARTBURN OR UPPER ABDOMINAL PAIN   mercaptopurine 50 MG tablet Commonly known as:  PURINETHOL TAKE 2 TABLETS DAILY ON EMPTY STOMACH 1 HOUR BEFORE OR 2 HOURS AFTER M EAL   metFORMIN 500 MG tablet Commonly known as:  GLUCOPHAGE Take by mouth 2 (two) times daily with a meal.   mirtazapine 15 MG tablet Commonly known as:  REMERON Take 15 mg by mouth at bedtime.   pantoprazole 40 MG tablet Commonly known as:  PROTONIX TAKE (1) TABLET BY MOUTH TWICE A DAY BEFORE MEALS. (BREAKFAST AND SUPPER)   potassium chloride SA 20 MEQ tablet Commonly known as:  K-DUR,KLOR-CON Take 20 mEq by mouth daily.   albuterol (2.5 MG/3ML) 0.083% nebulizer solution Commonly known as:  PROVENTIL Take 2.5 mg by nebulization every 6 (six) hours as needed for wheezing or shortness of breath.   PROAIR HFA 108 (90 Base) MCG/ACT inhaler Generic drug:  albuterol Inhale 2 puffs into the lungs every 6 (six) hours as needed for wheezing or shortness of breath.   PROCARE ADULT BRIEFS LARGE Misc Use as directed   risperiDONE 2 MG tablet Commonly known as:  RISPERDAL Take 2 mg by mouth at bedtime. 3.5 tabs qhs   sertraline 50 MG tablet Commonly known as:  ZOLOFT Take 50 mg by mouth daily.   tamsulosin 0.4 MG Caps capsule Commonly known as:  FLOMAX Take 0.4 mg by mouth daily.      Follow-up Information    [name], MD. Schedule an appointment as soon as possible for a visit in 1 week(s).   Specialty:  Family Medicine Why:  Hospital Follow Up   Contact information: [address] [place] [address] [phone]          Allergies  Allergen Reactions  . Aspirin     REACTION: unknown reaction  . Bactrim [Sulfamethoxazole-Trimethoprim]     ABD CRAMPS AND DIARRHEA  . Codeine     REACTION: unknown reaction  . Ibuprofen     REACTION: unknown reaction  . Penicillins     REACTION: unknown reaction  . Remicade [Infliximab]     COULDN'T BREATHE  . Tramadol Hcl     REACTION: unknown reaction  . Vicodin [Hydrocodone-Acetaminophen] Nausea  Only   Allergies as of 12/31/2017      Reactions   Aspirin    REACTION: unknown reaction   Bactrim [sulfamethoxazole-trimethoprim]    ABD CRAMPS AND DIARRHEA   Codeine    REACTION: unknown reaction   Ibuprofen    REACTION: unknown reaction   Penicillins    REACTION: unknown reaction   Remicade [infliximab]    COULDN'T BREATHE   Tramadol Hcl    REACTION: unknown reaction   Vicodin [hydrocodone-acetaminophen] Nausea Only      Medication List    TAKE these medications   acetaminophen 500 MG tablet Commonly known as:  TYLENOL Take 1,000 mg by mouth every 6 (six) hours as needed. For pain   atorvastatin 80 MG tablet Commonly known as:  LIPITOR Take 80 mg by mouth daily.   benztropine 0.5 MG tablet Commonly known as:  COGENTIN Take 0.5 mg by mouth daily.   Calcium Carbonate-Vitamin D 600-400 MG-UNIT tablet Commonly known as:  CALCIUM 600+D 1 PO TID AFTER A MEAL   ciprofloxacin 500 MG tablet Commonly known as:  CIPRO Take 1 tablet (500 mg total) by mouth 2 (two) times daily for 7 days.   dicyclomine 10 MG capsule Commonly known as:  BENTYL TAKE 1 OR 2 CAPSULE BY MOUTH AS DIRECTED BEFORE MEALS AND AT BEDTIME.   divalproex 500 MG 24 hr tablet Commonly known as:  DEPAKOTE ER Take 1,000 mg by mouth every evening.   divalproex 250 MG 24 hr tablet Commonly known as:  DEPAKOTE ER Take 250 mg by mouth daily.   folic acid 1 MG tablet Commonly known as:   FOLVITE Take 1 mg by mouth daily.   furosemide 40 MG tablet Commonly known as:  LASIX Take 40 mg by mouth daily.   gabapentin 300 MG capsule Commonly known as:  NEURONTIN Take 900 mg by mouth 3 (three) times daily.   lidocaine 2 % solution Commonly known as:  XYLOCAINE 2 TSP  PO Q4-6H PRN FOR HEARTBURN OR UPPER ABDOMINAL PAIN   mercaptopurine 50 MG tablet Commonly known as:  PURINETHOL TAKE 2 TABLETS DAILY ON EMPTY STOMACH 1 HOUR BEFORE OR 2 HOURS AFTER M EAL   metFORMIN 500 MG tablet Commonly known as:  GLUCOPHAGE Take by mouth 2 (two) times daily with a meal.   mirtazapine 15 MG tablet Commonly known as:  REMERON Take 15 mg by mouth at bedtime.   pantoprazole 40 MG tablet Commonly known as:  PROTONIX TAKE (1) TABLET BY MOUTH TWICE A DAY BEFORE MEALS. (BREAKFAST AND SUPPER)   potassium chloride SA 20 MEQ tablet Commonly known as:  K-DUR,KLOR-CON Take 20 mEq by mouth daily.   albuterol (2.5 MG/3ML) 0.083% nebulizer solution Commonly known as:  PROVENTIL Take 2.5 mg by nebulization every 6 (six) hours as needed for wheezing or shortness of breath.   PROAIR HFA 108 (90 Base) MCG/ACT inhaler Generic drug:  albuterol Inhale 2 puffs into the lungs every 6 (six) hours as needed for wheezing or shortness of breath.   PROCARE ADULT BRIEFS LARGE Misc Use as directed   risperiDONE 2 MG tablet Commonly known as:  RISPERDAL Take 2 mg by mouth at bedtime. 3.5 tabs qhs   sertraline 50 MG tablet Commonly known as:  ZOLOFT Take 50 mg by mouth daily.   tamsulosin 0.4 MG Caps capsule Commonly known as:  FLOMAX Take 0.4 mg by mouth daily.       Procedures/Studies: Ct Abdomen Pelvis W Contrast  Result Date: 12/29/2017 CLINICAL  DATA:  Generalized abdominal pain for 2 days, no bowel movement. History of total colectomy and Crohn's disease. EXAM: CT ABDOMEN AND PELVIS WITH CONTRAST TECHNIQUE: Multidetector CT imaging of the abdomen and pelvis was performed using the standard  protocol following bolus administration of intravenous contrast. CONTRAST:  27m ISOVUE-300 IOPAMIDOL (ISOVUE-300) INJECTION 61%, 1059mISOVUE-300 IOPAMIDOL (ISOVUE-300) INJECTION 61% COMPARISON:  CT abdomen and pelvis March 05, 2014 FINDINGS: LOWER CHEST: Mild dependent atelectasis. Included heart size is normal. No pericardial effusion. HEPATOBILIARY: Liver and gallbladder are normal. PANCREAS: Normal. SPLEEN: Normal. ADRENALS/URINARY TRACT: Kidneys are orthotopic, demonstrating symmetric enhancement. New LEFT upper pole scarring and cortical atrophy. No nephrolithiasis, hydronephrosis or solid renal masses. The unopacified ureters are normal in course and caliber. Delayed imaging through the kidneys demonstrates symmetric prompt contrast excretion within the proximal urinary collecting system. Urinary bladder is well distended with mild predominately LEFT-sided urinary bladder wall thickening. Stable 18 mm LEFT adrenal nodule. STOMACH/BOWEL: Status post total colectomy. Redundant stool filled small bowel at RIGHT mid abdomen ileostomy. Mildly dilated small bowel to 3.4 cm with transition point at ileostomy. Status post total colectomy. VASCULAR/LYMPHATIC: Aortoiliac vessels are normal in course and caliber. Mild atherosclerosis. No lymphadenopathy by CT size criteria. REPRODUCTIVE: Normal. OTHER: Small amount of free fluid in ileostomy and RIGHT abdomen. MUSCULOSKELETAL: Nonacute. Streak artifact from RIGHT hip total arthroplasty. IMPRESSION: 1. Early versus partial small bowel obstruction with transition point at ileostomy. Status post total colectomy. 2. Small volume ascites.  No drainable fluid collection. 3. Mildly thickened urinary bladder concerning for cystitis. New LEFT renal scarring. Aortic Atherosclerosis (ICD10-I70.0). Electronically Signed   By: CoElon Alas.D.   On: 12/29/2017 18:17   Dg Chest Portable 1 View  Result Date: 12/29/2017 CLINICAL DATA:  Nasogastric tube placement EXAM: PORTABLE  CHEST 1 VIEW COMPARISON:  October 27, 2017 FINDINGS: Nasogastric tube tip is below the diaphragm. Central catheter tip is in the superior vena cava. No pneumothorax. Lungs are clear. Heart size and pulmonary vascular normal. There are total shoulder replacements bilaterally. IMPRESSION: Nasogastric tip not seen. Nasogastric tube tip appears below the diaphragm. Port-A-Cath tip in superior vena cava. No pneumothorax. Lungs clear. Electronically Signed   By: WiLowella GripII M.D.   On: 12/29/2017 20:29   Dg Chest Port 1v Same Day  Result Date: 12/29/2017 CLINICAL DATA:  [age]ear old male status post NG tube adjustment. EXAM: PORTABLE CHEST 1 VIEW COMPARISON:  2011 hours today. FINDINGS: Portable AP upright view at 2100 hours. The NG tube is looped in the left upper quadrant compatible with placement into the stomach with some redundancy. The mediastinal contour remains normal. Stable right chest porta cath. Lung parenchyma is stable and clear when allowing for portable technique. Paucity of bowel gas in the upper abdomen. No pneumoperitoneum identified. Stable visualized osseous structures. IMPRESSION: 1. NG tube satisfactorily placed, looped in the stomach. 2.  No acute cardiopulmonary abnormality. Electronically Signed   By: H Genevie Ann.D.   On: 12/29/2017 21:21      Subjective: Pt tolerating diet well.  He has no complaints.   Discharge Exam: Vitals:   12/30/17 2300 12/31/17 0620  BP: 110/67 107/69  Pulse: 76 64  Resp: 18 20  Temp: 98.1 F (36.7 C) 98.2 F (36.8 C)  SpO2: 93% 99%   Vitals:   12/30/17 0627 12/30/17 1311 12/30/17 2300 12/31/17 0620  BP: 125/75 111/60 110/67 107/69  Pulse: 92 87 76 64  Resp: 18 17 18 20   Temp: 98 F (36.7 C) 98.3  F (36.8 C) 98.1 F (36.7 C) 98.2 F (36.8 C)  TempSrc: Oral Oral  Oral  SpO2: 94% 95% 93% 99%  Weight:      Height:        General: Pt is alert, awake, not in acute distress Cardiovascular: RRR, S1/S2 +, no rubs, no  gallops Respiratory: CTA bilaterally, no wheezing, no rhonchi Abdominal: Soft, NT, ND, bowel sounds + Extremities: no edema, no cyanosis   The results of significant diagnostics from this hospitalization (including imaging, microbiology, ancillary and laboratory) are listed below for reference.     Microbiology: No results found for this or any previous visit (from the past 240 hour(s)).   Labs: BNP (last 3 results) No results for input(s): BNP in the last 8760 hours. Basic Metabolic Panel: Recent Labs  Lab 12/29/17 1114 12/30/17 0604  NA 137 136  K 4.2 4.7  CL 96* 99*  CO2 23 24  GLUCOSE 164* 111*  BUN 19 18  CREATININE 1.08 0.81  CALCIUM 10.4* 8.7*   Liver Function Tests: Recent Labs  Lab 12/29/17 1114  AST 28  ALT 52  ALKPHOS 72  BILITOT 2.1*  PROT 7.7  ALBUMIN 4.0   Recent Labs  Lab 12/29/17 1343  LIPASE 20   No results for input(s): AMMONIA in the last 168 hours. CBC: Recent Labs  Lab 12/29/17 1114 12/30/17 0604  WBC 7.6 3.2*  NEUTROABS 6.6  --   HGB 17.0 15.1  HCT 49.6 45.3  MCV 101.6* 103.0*  PLT 138* 120*   Cardiac Enzymes: Recent Labs  Lab 12/29/17 1114  TROPONINI <0.03   BNP: Invalid input(s): POCBNP CBG: No results for input(s): GLUCAP in the last 168 hours. D-Dimer No results for input(s): DDIMER in the last 72 hours. Hgb A1c No results for input(s): HGBA1C in the last 72 hours. Lipid Profile No results for input(s): CHOL, HDL, LDLCALC, TRIG, CHOLHDL, LDLDIRECT in the last 72 hours. Thyroid function studies No results for input(s): TSH, T4TOTAL, T3FREE, THYROIDAB in the last 72 hours.  Invalid input(s): FREET3 Anemia work up No results for input(s): VITAMINB12, FOLATE, FERRITIN, TIBC, IRON, RETICCTPCT in the last 72 hours. Urinalysis    Component Value Date/Time   COLORURINE YELLOW 12/29/2017 1140   APPEARANCEUR HAZY (A) 12/29/2017 1140   LABSPEC 1.017 12/29/2017 1140   PHURINE 5.0 12/29/2017 1140   GLUCOSEU NEGATIVE  12/29/2017 1140   HGBUR SMALL (A) 12/29/2017 1140   BILIRUBINUR NEGATIVE 12/29/2017 1140   KETONESUR NEGATIVE 12/29/2017 1140   PROTEINUR NEGATIVE 12/29/2017 1140   UROBILINOGEN 0.2 10/17/2014 1940   NITRITE POSITIVE (A) 12/29/2017 1140   LEUKOCYTESUR LARGE (A) 12/29/2017 1140   Sepsis Labs Invalid input(s): PROCALCITONIN,  WBC,  LACTICIDVEN Microbiology No results found for this or any previous visit (from the past 240 hour(s)).  Time coordinating discharge:   SIGNED:  Irwin Brakeman, MD  [facility] 12/31/2017, 1:32 PM Pager [phone]  If 7PM-7AM, please contact night-coverage [email or web address] Password [email or web address]

## 2018-01-02 ENCOUNTER — Telehealth: Payer: Self-pay

## 2018-01-02 NOTE — Telephone Encounter (Signed)
REVIEWED. AGREE. NO ADDITIONAL RECOMMENDATIONS. 

## 2018-01-02 NOTE — Telephone Encounter (Signed)
Pt called and said he had to go to ED last Thursday and had a slight small bowel obstruction. He said he is in a lot of pain ( not cramping) now all over his stomach. I told him he should go on the the ED now and he said that he will.

## 2018-01-04 NOTE — Telephone Encounter (Signed)
New script sent to [facility]. When called, [facility] said they didn't have previous faxed scrip.

## 2018-02-02 ENCOUNTER — Ambulatory Visit (INDEPENDENT_AMBULATORY_CARE_PROVIDER_SITE_OTHER): Payer: Medicaid Other | Admitting: Gastroenterology

## 2018-02-02 ENCOUNTER — Encounter: Payer: Self-pay | Admitting: Gastroenterology

## 2018-02-02 DIAGNOSIS — K760 Fatty (change of) liver, not elsewhere classified: Secondary | ICD-10-CM

## 2018-02-02 DIAGNOSIS — K50018 Crohn's disease of small intestine with other complication: Secondary | ICD-10-CM

## 2018-02-02 DIAGNOSIS — K219 Gastro-esophageal reflux disease without esophagitis: Secondary | ICD-10-CM

## 2018-02-02 MED ORDER — MERCAPTOPURINE 50 MG PO TABS: ORAL_TABLET | ORAL | 3 refills | Status: DC

## 2018-02-02 NOTE — Assessment & Plan Note (Signed)
RECURRENT IN AN IMMUNOCOMPROMISED PT.   SEE UROLOGY FOR MANAGEMENT OF YOU URINARY TRACT INFECTIONS. FOLLOW UP IN 6 MOS.

## 2018-02-02 NOTE — Assessment & Plan Note (Addendum)
SYMPTOMS FAIRLY WELL CONTROLLED. RECENTLY ADMITTED WITH EARLY ILEUS V. SBO AND UTI.  CONTINUE MERCAPTOPURINE. REVIEWED LABS AND CT SCAN FROM [facility] 2019 WITH PT-PLT CT < 150 K. CALL WITH QUESTIONS OR CONCERNS. IF NEEDED, USE BENTYL TO CONTROL LOOSE/WATERY STOOLS. FOLLOW UP IN 6 MOS. GET LABS DRAWN ONE WEEK PRIOR TO YOUR NEXT VISIT: CBC/CMP.

## 2018-02-02 NOTE — Assessment & Plan Note (Signed)
WEIGHT STABLE AT 195 LBS BUT BMI > 30. HFP MAR 2019 WNLs.  CONTINUE TO MONITOR SYMPTOMS. CONTINUE YOUR WEIGHT LOSS EFFORTS. LOSE 10 MORE LBS. FOLLOW UP IN 6 MOS.

## 2018-02-02 NOTE — Patient Instructions (Addendum)
CONTINUE YOUR WEIGHT LOSS EFFORTS. LOSE 10 MORE LBS.  CONTINUE MERCAPTOPURINE.  SEE UROLOGY FOR MANAGEMENT OF YOU URINARY TRACT INFECTIONS.   CONTINUE PROTONIX. TAKE 30 MINUTES PRIOR TO MEALS TWICE DAILY.  IF NEEDED USE VISCOUS LIDOCAINE 2 TSP EVERY 4 HRS TO REDUCE CHEST, OR UPPER ABDOMINAL PAIN OR HEARTBURN. USE NO MORE THAN 8 DOSES A DAY. IT WILL MAKE YOUR MOUTH, ESOPHAGUS, AND STOMACH NUMB.  IF NEEDED, USE BENTYL TO CONTROL LOOSE/WATERY STOOLS.   PLEASE CALL WITH QUESTIONS OR CONCERNS.  FOLLOW UP IN 6 MOS. GET LABS DRAWN ONE WEEK PRIOR TO YOUR NEXT VISIT.

## 2018-02-02 NOTE — Progress Notes (Signed)
Subjective:    Patient ID: [name], male    DOB: 28-Jun-1966, 52 y.o.   MRN: [id number]   [name], MD   HPI ADMITTED IN [facility] 2019 FOR BOWEL OBSTRUCTION. STAYED 3 DAYS. EMPTIES BOWELS 1-2 TIMES A DAY. MAY NOT HAVE BM. FEELS CONSTIPATION 1-2X/WEEK. SEE DIARRHEA EVERY DAY-NO BLOOD(#6). CAN HAVE A HAVE #4 BUT USUALLY #6. GETS NAUSEATED BUT DOESN'T THROW UP. PLAN FOR GU TRACT. NEEDS TO MAKE AN APPT. WATCHING HOROR MOVIES NOW. ON A STRICT CIGARETTE MODE. TRYING TO QUIT. HAS PAIN AROUND OSTOMY AND LLQ AND CAN BE SEVERE. HAS PAIN EVERY DAY. IT COMES GOES(SHARP AND REAL PAINFUL, LASTS ABOUT 45 MINS-1 HR.) RARE HEARTBURN AFTER TAKING NIGHTTIME MEDICINES. DORA STAYS WITH HER AND THEY TAKE CARE OF EACH OTHER. LIKES 5 ALARM CHILI.  PT DENIES FEVER, CHILLS, HEMATOCHEZIA, HEMATEMESIS, nausea, vomiting, melena, diarrhea, CHEST PAIN, SHORTNESS OF BREATH, CHANGE IN BOWEL IN HABITS, OR problems swallowing.  Past Medical History:  Diagnosis Date  . Arthritis   . Asthma   . 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 Bipolar disorder ([place])   . Colostomy in place [facility])   . Crohn's colitis ([place])    s/p total colectomy with ileostomy in 2009  . Crohn's disease of small intestine [facility]) Sept 2012   ileoscopy: multiple ulcers likely secondary to Crohn's  . DEEP VENOUS THROMBOPHLEBITIS, HX OF 02/11/2009   Qualifier: Diagnosis of  By: Zeb Comfort    . Depression   . DVT (deep venous thrombosis) ([place]) 2009   right upper extremity due to PICC  . Enterococcus UTI 2009  . GERD (gastroesophageal reflux disease)   . Hernia   . Hyperlipidemia   . Insomnia   . Migraine headache   . Nystagmus   . PULMONARY EMBOLISM, HX OF 02/11/2009   Qualifier: Diagnosis of  By: Zeb Comfort    . S/P endoscopy Sept 2012   mild gastritis, small hiatal hernia, no ulcers  . Schizophrenia ([place])   . Schizophrenia, acute [facility])    Past Surgical History:  Procedure  Laterality Date  . APPENDECTOMY    . ESOPHAGOGASTRODUODENOSCOPY  07/2010   gastritis,  bx neg for H.Pylori  . ESOPHAGOGASTRODUODENOSCOPY N/A 01/08/2014   SLF:NO SOURCE FOR ODYNOPHAGIA IDENTIFIED/Multiple small ulcers in the gastric antrum  . EYE SURGERY    . ILEOSCOPY  06/29/2011   SLF: ulcers,multiple/small HH/mild gastritis  . KIDNEY SURGERY    . LAPAROTOMY N/A 06/05/2013   Procedure: EXPLORATORY LAPAROTOMY;  Surgeon: Donato Heinz, MD;  Location: [facility];  Service: General;  Laterality: N/A;  . LYSIS OF ADHESION N/A 06/05/2013   Procedure: LYSIS OF ADHESIONS;  Surgeon: Donato Heinz, MD;  Location: [facility] ORS;  Service: General;  Laterality: N/A;  . small bowel capsule study  08/2010   few tiny nonbleeding erosions/ulcers ?secondary to relafen or Crohn's. Entire small bowel not seen.   Marland Kitchen TOTAL COLECTOMY  2009   with ileostomy at [facility] for refractory disease   . TOTAL HIP ARTHROPLASTY     right, due to avascular necrosis from chronic prednisone  . TOTAL SHOULDER REPLACEMENT     bilateral   Allergies  Allergen Reactions  . Aspirin     REACTION: unknown reaction  . Bactrim [Sulfamethoxazole-Trimethoprim]     ABD CRAMPS AND DIARRHEA  . Codeine     REACTION: unknown reaction  . Ibuprofen     REACTION: unknown reaction  .  Penicillins     REACTION: unknown reaction  . Remicade [Infliximab]     COULDN'T BREATHE  . Tramadol Hcl     REACTION: unknown reaction  . Vicodin [Hydrocodone-Acetaminophen] Nausea Only    Current Outpatient Medications  Medication Sig    . acetaminophen (TYLENOL) 500 MG tablet Take 1,000 mg by mouth every 6 (six) hours as needed. For pain     . albuterol (PROAIR HFA) 108 (90 BASE) MCG/ACT inhaler Inhale 2 puffs into the lungs every 6 (six) hours as needed for wheezing or shortness of breath.    Marland Kitchen albuterol (PROVENTIL) (2.5 MG/3ML) 0.083% nebulizer solution Take 2.5 mg by nebulization every 6 (six) hours as needed for wheezing or shortness of breath.    Marland Kitchen  atorvastatin (LIPITOR) 80 MG tablet Take 80 mg by mouth daily.    . benztropine (COGENTIN) 0.5 MG tablet Take 0.5 mg by mouth daily.     . Calcium Carbonate-Vitamin D (CALCIUM 600+D) 600-400 MG-UNIT tablet 1 PO TID AFTER A MEAL    . dicyclomine (BENTYL) 10 MG capsule TAKE 1 OR 2 CAPSULE BY MOUTH AS DIRECTED BEFORE MEALS AND AT BEDTIME.    . divalproex (DEPAKOTE ER) 250 MG 24 hr tablet Take 250 mg by mouth daily.     . divalproex (DEPAKOTE) 500 MG 24 hr tablet Take 1,000 mg by mouth every evening.     . folic acid (FOLVITE) 1 MG tablet Take 1 mg by mouth daily.      . furosemide (LASIX) 40 MG tablet Take 40 mg by mouth daily.      Marland Kitchen gabapentin (NEURONTIN) 300 MG capsule Take 900 mg by mouth 3 (three) times daily.     . Incontinence Supply Disposable (PROCARE ADULT BRIEFS LARGE) MISC Use as directed    . lidocaine (XYLOCAINE) 2 % solution 2 TSP  PO Q4-6H PRN FOR HEARTBURN OR UPPER ABDOMINAL PAIN    . mercaptopurine (PURINETHOL) 50 MG tablet TAKE 2 TABLETS DAILY ON EMPTY STOMACH 1 HOUR BEFORE OR 2 HOURS AFTER M EAL    . metFORMIN (GLUCOPHAGE) 500 MG tablet Take by mouth 2 (two) times daily with a meal.    . mirtazapine (REMERON) 15 MG tablet Take 15 mg by mouth at bedtime.    . pantoprazole (PROTONIX) 40 MG tablet TAKE (1) TABLET BY MOUTH TWICE A DAY BEFORE MEALS. (BREAKFAST AND SUPPER)    . potassium chloride SA (K-DUR,KLOR-CON) 20 MEQ tablet Take 20 mEq by mouth daily.     . risperiDONE (RISPERDAL) 2 MG tablet Take 2 mg by mouth at bedtime. 3.5 tabs qhs    . sertraline (ZOLOFT) 50 MG tablet Take 50 mg by mouth daily.    . tamsulosin (FLOMAX) 0.4 MG CAPS capsule Take 0.4 mg by mouth daily.     Review of Systems PER HPI OTHERWISE ALL SYSTEMS ARE NEGATIVE.    Objective:   Physical Exam  Constitutional: He is oriented to person, place, and time. He appears well-developed and well-nourished. No distress.  HENT:  Head: Normocephalic and atraumatic.  Mouth/Throat: Oropharynx is clear and moist.  No oropharyngeal exudate.  Eyes: Pupils are equal, round, and reactive to light. No scleral icterus.  Neck: Normal range of motion. Neck supple.  Cardiovascular: Normal rate, regular rhythm and normal heart sounds.  Pulmonary/Chest: Effort normal and breath sounds normal. No respiratory distress.  Abdominal: Soft. Bowel sounds are normal. He exhibits no distension. There is no tenderness.  Ostomy in RLQ-WATER STOOL IN THE BAG/GREEN  Musculoskeletal:  He exhibits no edema.  Lymphadenopathy:    He has no cervical adenopathy.  Neurological: He is alert and oriented to person, place, and time.  NO  NEW FOCAL DEFICITS, nystagmus present  Psychiatric: He has a normal mood and affect.  Vitals reviewed.     Assessment & Plan:

## 2018-02-02 NOTE — Assessment & Plan Note (Addendum)
SYMPTOMS FAIRLY WELL CONTROLLED.  CONTINUE YOUR WEIGHT LOSS EFFORTS(BMI > 30). CONTINUE PROTONIX. TAKE 30 MINUTES PRIOR TO MEALS TWICE DAILY. IF NEEDED USE VISCOUS LIDOCAINE 2 TSP EVERY 4 HRS TO REDUCE CHEST, OR UPPER ABDOMINAL PAIN OR HEARTBURN. USE NO MORE THAN 8 DOSES A DAY. IT WILL MAKE YOUR MOUTH, ESOPHAGUS, AND STOMACH NUMB. FOLLOW UP IN 6 MOS.

## 2018-02-06 NOTE — Progress Notes (Signed)
cc'ed to pcp °

## 2018-02-06 NOTE — Progress Notes (Signed)
ON RECALL  °

## 2018-02-08 ENCOUNTER — Other Ambulatory Visit: Payer: Self-pay

## 2018-02-08 DIAGNOSIS — K50919 Crohn's disease, unspecified, with unspecified complications: Secondary | ICD-10-CM

## 2018-02-09 ENCOUNTER — Other Ambulatory Visit: Payer: Self-pay

## 2018-02-09 ENCOUNTER — Emergency Department (HOSPITAL_COMMUNITY): Payer: Medicaid Other

## 2018-02-09 ENCOUNTER — Encounter (HOSPITAL_COMMUNITY): Payer: Self-pay | Admitting: Emergency Medicine

## 2018-02-09 ENCOUNTER — Inpatient Hospital Stay (HOSPITAL_COMMUNITY)
Admission: EM | Admit: 2018-02-09 | Discharge: 2018-02-14 | DRG: 394 | Disposition: A | Discharge status: Home / Self Care | Payer: Medicaid Other | Attending: Internal Medicine | Admitting: Internal Medicine

## 2018-02-09 DIAGNOSIS — Z880 Allergy status to penicillin: Secondary | ICD-10-CM

## 2018-02-09 DIAGNOSIS — K760 Fatty (change of) liver, not elsewhere classified: Secondary | ICD-10-CM | POA: Diagnosis present

## 2018-02-09 DIAGNOSIS — Z886 Allergy status to analgesic agent status: Secondary | ICD-10-CM

## 2018-02-09 DIAGNOSIS — Z9049 Acquired absence of other specified parts of digestive tract: Secondary | ICD-10-CM

## 2018-02-09 DIAGNOSIS — Z888 Allergy status to other drugs, medicaments and biological substances status: Secondary | ICD-10-CM

## 2018-02-09 DIAGNOSIS — K5000 Crohn's disease of small intestine without complications: Secondary | ICD-10-CM | POA: Diagnosis present

## 2018-02-09 DIAGNOSIS — K433 Parastomal hernia with obstruction, without gangrene: Principal | ICD-10-CM

## 2018-02-09 DIAGNOSIS — K56609 Unspecified intestinal obstruction, unspecified as to partial versus complete obstruction: Secondary | ICD-10-CM | POA: Diagnosis present

## 2018-02-09 DIAGNOSIS — Z885 Allergy status to narcotic agent status: Secondary | ICD-10-CM

## 2018-02-09 DIAGNOSIS — E872 Acidosis, unspecified: Secondary | ICD-10-CM | POA: Diagnosis present

## 2018-02-09 DIAGNOSIS — Z79899 Other long term (current) drug therapy: Secondary | ICD-10-CM

## 2018-02-09 DIAGNOSIS — K50819 Crohn's disease of both small and large intestine with unspecified complications: Secondary | ICD-10-CM | POA: Diagnosis present

## 2018-02-09 DIAGNOSIS — J45909 Unspecified asthma, uncomplicated: Secondary | ICD-10-CM | POA: Diagnosis present

## 2018-02-09 DIAGNOSIS — K219 Gastro-esophageal reflux disease without esophagitis: Secondary | ICD-10-CM | POA: Diagnosis present

## 2018-02-09 DIAGNOSIS — Z96611 Presence of right artificial shoulder joint: Secondary | ICD-10-CM | POA: Diagnosis present

## 2018-02-09 DIAGNOSIS — K56600 Partial intestinal obstruction, unspecified as to cause: Secondary | ICD-10-CM

## 2018-02-09 DIAGNOSIS — Z96641 Presence of right artificial hip joint: Secondary | ICD-10-CM | POA: Diagnosis present

## 2018-02-09 DIAGNOSIS — Z882 Allergy status to sulfonamides status: Secondary | ICD-10-CM

## 2018-02-09 DIAGNOSIS — Z7984 Long term (current) use of oral hypoglycemic drugs: Secondary | ICD-10-CM

## 2018-02-09 DIAGNOSIS — F319 Bipolar disorder, unspecified: Secondary | ICD-10-CM | POA: Diagnosis present

## 2018-02-09 DIAGNOSIS — E785 Hyperlipidemia, unspecified: Secondary | ICD-10-CM | POA: Diagnosis present

## 2018-02-09 DIAGNOSIS — E860 Dehydration: Secondary | ICD-10-CM | POA: Diagnosis present

## 2018-02-09 DIAGNOSIS — Z96612 Presence of left artificial shoulder joint: Secondary | ICD-10-CM | POA: Diagnosis present

## 2018-02-09 DIAGNOSIS — Z932 Ileostomy status: Secondary | ICD-10-CM

## 2018-02-09 DIAGNOSIS — F17210 Nicotine dependence, cigarettes, uncomplicated: Secondary | ICD-10-CM | POA: Diagnosis present

## 2018-02-09 LAB — CBC WITH DIFFERENTIAL/PLATELET
Basophils Absolute: 0 10*3/uL (ref 0.0–0.1)
Basophils Relative: 0 %
Eosinophils Absolute: 0.1 10*3/uL (ref 0.0–0.7)
Eosinophils Relative: 2 %
HEMATOCRIT: 44 % (ref 39.0–52.0)
HEMOGLOBIN: 15.3 g/dL (ref 13.0–17.0)
LYMPHS ABS: 1.4 10*3/uL (ref 0.7–4.0)
Lymphocytes Relative: 25 %
MCH: 35.7 pg — AB (ref 26.0–34.0)
MCHC: 34.8 g/dL (ref 30.0–36.0)
MCV: 102.6 fL — ABNORMAL HIGH (ref 78.0–100.0)
Monocytes Absolute: 0.5 10*3/uL (ref 0.1–1.0)
Monocytes Relative: 9 %
Neutro Abs: 3.6 10*3/uL (ref 1.7–7.7)
Neutrophils Relative %: 64 %
Platelets: 128 10*3/uL — ABNORMAL LOW (ref 150–400)
RBC: 4.29 MIL/uL (ref 4.22–5.81)
RDW: 14.6 % (ref 11.5–15.5)
WBC: 5.5 10*3/uL (ref 4.0–10.5)

## 2018-02-09 MED ORDER — HYDROMORPHONE HCL 1 MG/ML IJ SOLN
1.0000 mg | Freq: Once | INTRAMUSCULAR | Status: AC
  Administered 2018-02-09: 1 mg via INTRAVENOUS
  Filled 2018-02-09: qty 1

## 2018-02-09 MED ORDER — ONDANSETRON HCL 4 MG/2ML IJ SOLN
4.0000 mg | Freq: Once | INTRAMUSCULAR | Status: AC
  Administered 2018-02-09: 4 mg via INTRAVENOUS
  Filled 2018-02-09: qty 2

## 2018-02-09 MED ORDER — SODIUM CHLORIDE 0.9 % IV BOLUS
500.0000 mL | Freq: Once | INTRAVENOUS | Status: AC
  Administered 2018-02-09: 1000 mL via INTRAVENOUS

## 2018-02-09 NOTE — ED Triage Notes (Signed)
Pt c/o abd pain since this evening with increased herniation to his colostomy.

## 2018-02-09 NOTE — ED Provider Notes (Signed)
[facility] EMERGENCY DEPARTMENT Provider Note   CSN: [id number] Arrival date & time: 02/09/18  2244     History   Chief Complaint Chief Complaint  Patient presents with  . Abdominal Pain    HPI [name] is a 52 y.o. male.  Patient presents to the emergency department for evaluation of abdominal pain.  Patient reports that symptoms began earlier today.  Over the course of the day he has only had 2 change his colostomy bag 2 times, normally it is 6 times.  He has noticed increased swelling around the area of his ostomy.  Pain has progressively worsened and is constant.  He has not had vomiting.  No fever.     Past Medical History:  Diagnosis Date  . Arthritis   . Asthma   . 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 Bipolar disorder ([place])   . Colostomy in place [facility])   . Crohn's colitis ([place])    s/p total colectomy with ileostomy in 2009  . Crohn's disease of small intestine [facility]) Sept 2012   ileoscopy: multiple ulcers likely secondary to Crohn's  . DEEP VENOUS THROMBOPHLEBITIS, HX OF 02/11/2009   Qualifier: Diagnosis of  By: Zeb Comfort    . Depression   . DVT (deep venous thrombosis) ([place]) 2009   right upper extremity due to PICC  . Enterococcus UTI 2009  . GERD (gastroesophageal reflux disease)   . Hernia   . Hyperlipidemia   . Insomnia   . Migraine headache   . Nystagmus   . PULMONARY EMBOLISM, HX OF 02/11/2009   Qualifier: Diagnosis of  By: Zeb Comfort    . S/P endoscopy Sept 2012   mild gastritis, small hiatal hernia, no ulcers  . Schizophrenia ([place])   . Schizophrenia, acute [facility])     Patient Active Problem List   Diagnosis Date Noted  . UTI (urinary tract infection) 12/31/2017  . SBO (small bowel obstruction) ([place]) 12/29/2017  . Arthritis of knee, degenerative 02/14/2013  . 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 Crohn's disease of small intestine ([place]) 07/19/2011  . Hepatic steatosis  07/19/2011  . HYPERLIPIDEMIA 02/11/2009  . BIPOLAR AFFECTIVE DISORDER 02/11/2009  . NYSTAGMUS 02/11/2009  . ASTHMA 02/11/2009  . GERD 02/11/2009  . 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 INSOMNIA 02/11/2009    Past Surgical History:  Procedure Laterality Date  . APPENDECTOMY    . ESOPHAGOGASTRODUODENOSCOPY  07/2010   gastritis,  bx neg for H.Pylori  . ESOPHAGOGASTRODUODENOSCOPY N/A 01/08/2014   SLF:NO SOURCE FOR ODYNOPHAGIA IDENTIFIED/Multiple small ulcers in the gastric antrum  . EYE SURGERY    . ILEOSCOPY  06/29/2011   SLF: ulcers,multiple/small HH/mild gastritis  . KIDNEY SURGERY    . LAPAROTOMY N/A 06/05/2013   Procedure: EXPLORATORY LAPAROTOMY;  Surgeon: Donato Heinz, MD;  Location: [facility] ORS;  Service: General;  Laterality: N/A;  . LYSIS OF ADHESION N/A 06/05/2013   Procedure: LYSIS OF ADHESIONS;  Surgeon: Donato Heinz, MD;  Location: [facility] OR[facility];  Service: General;  Laterality: N/A;  . small bowel capsule study  08/2010   few tiny nonbleeding erosions/ulcers ?secondary to relafen or Crohn's. Entire small bowel not seen.   Marland Kitchen TOTAL COLECTOMY  2009   with ileostomy at [facility] for refractory disease   . TOTAL HIP ARTHROPLASTY     right, due to avascular necrosis from chronic prednisone  . TOTAL SHOULDER REPLACEMENT     bilateral  Home Medications    Prior to Admission medications   Medication Sig Start Date End Date Taking? Authorizing Provider  atorvastatin (LIPITOR) 80 MG tablet Take 80 mg by mouth daily.   Yes [provider]  benztropine (COGENTIN) 0.5 MG tablet Take 0.5 mg by mouth daily.    Yes [provider]  Calcium Carbonate-Vitamin D (CALCIUM 600+D) 600-400 MG-UNIT tablet 1 PO TID AFTER A MEAL 07/13/17  Yes Fields, Sandi L, MD  divalproex (DEPAKOTE ER) 250 MG 24 hr tablet Take 250 mg by mouth daily.    Yes [provider]  divalproex (DEPAKOTE) 500 MG 24 hr tablet Take 1,000 mg by mouth every evening.    Yes [provider]  folic acid (FOLVITE) 1 MG tablet Take 1 mg by mouth daily.     Yes [provider]  furosemide (LASIX) 40 MG tablet Take 40 mg by mouth daily.     Yes [provider]  gabapentin (NEURONTIN) 300 MG capsule Take 900 mg by mouth 3 (three) times daily.    Yes [provider]  Incontinence Supply Disposable (PROCARE ADULT BRIEFS LARGE) MISC Use as directed 03/25/16  Yes Fields, Sandi L, MD  mercaptopurine (PURINETHOL) 50 MG tablet TAKE 2 TABLETS DAILY ON EMPTY STOMACH 1 HOUR BEFORE OR 2 HOURS AFTER M EAL 02/02/18  Yes Fields, Sandi L, MD  metFORMIN (GLUCOPHAGE) 500 MG tablet Take by mouth 2 (two) times daily with a meal.   Yes [provider]  mirtazapine (REMERON) 15 MG tablet Take 15 mg by mouth at bedtime.   Yes [provider]  pantoprazole (PROTONIX) 40 MG tablet TAKE (1) TABLET BY MOUTH TWICE A DAY BEFORE MEALS. (BREAKFAST AND SUPPER) 12/16/17  Yes Mahala Menghini, PA-C  potassium chloride SA (K-DUR,KLOR-CON) 20 MEQ tablet Take 20 mEq by mouth daily.    Yes [provider]  risperiDONE (RISPERDAL) 2 MG tablet Take 2 mg by mouth at bedtime. 3.5 tabs qhs   Yes [provider]  sertraline (ZOLOFT) 50 MG tablet Take 50 mg by mouth daily.   Yes [provider]  tamsulosin (FLOMAX) 0.4 MG CAPS capsule Take 0.4 mg by mouth daily.   Yes [provider]  acetaminophen (TYLENOL) 500 MG tablet Take 1,000 mg by mouth every 6 (six) hours as needed. For pain     [provider]  albuterol (PROAIR HFA) 108 (90 BASE) MCG/ACT inhaler Inhale 2 puffs into the lungs every 6 (six) hours as needed for wheezing or shortness of breath.    [provider]  albuterol (PROVENTIL) (2.5 MG/3ML) 0.083% nebulizer solution Take 2.5 mg by nebulization every 6 (six) hours as needed for wheezing or shortness of breath.    [provider]  dicyclomine (BENTYL) 10 MG capsule TAKE 1 OR 2 CAPSULE BY MOUTH AS DIRECTED  BEFORE MEALS AND AT BEDTIME. 03/11/17   Annitta Needs, NP  lidocaine (XYLOCAINE) 2 % solution 2 TSP  PO Q4-6H PRN FOR HEARTBURN OR UPPER ABDOMINAL PAIN 07/13/17   Danie Binder, MD    Family History History reviewed. No pertinent family history.  Social History Social History   Tobacco Use  . Smoking status: Current Every Day Smoker    Packs/day: 1.00    Types: Cigarettes  . Smokeless tobacco: Never Used  . Tobacco comment: one pack daily  Substance Use Topics  . Alcohol use: No  . Drug use: No     Allergies   Aspirin; Bactrim [sulfamethoxazole-trimethoprim];  Codeine; Ibuprofen; Penicillins; Remicade [infliximab]; Tramadol hcl; and Vicodin [hydrocodone-acetaminophen]   Review of Systems Review of Systems  Gastrointestinal: Positive for abdominal distention and abdominal pain.  All other systems reviewed and are negative.    Physical Exam Updated Vital Signs BP 121/61 (BP Location: Left Arm)   Pulse 91   Temp 97.8 F (36.6 C) (Oral)   Resp 20   Ht 5' 6"  (1.676 m)   Wt 88.5 kg (195 lb)   SpO2 98%   BMI 31.47 kg/m   Physical Exam  Constitutional: He is oriented to person, place, and time. He appears well-developed and well-nourished. No distress.  HENT:  Head: Normocephalic and atraumatic.  Right Ear: Hearing normal.  Left Ear: Hearing normal.  Nose: Nose normal.  Mouth/Throat: Oropharynx is clear and moist and mucous membranes are normal.  Eyes: Pupils are equal, round, and reactive to light. Conjunctivae and EOM are normal.  Neck: Normal range of motion. Neck supple.  Cardiovascular: Regular rhythm, S1 normal and S2 normal. Exam reveals no gallop and no friction rub.  No murmur heard. Pulmonary/Chest: Effort normal and breath sounds normal. No respiratory distress. He exhibits no tenderness.  Abdominal: Soft. Normal appearance and bowel sounds are normal. There is no hepatosplenomegaly. There is generalized tenderness. There is no rebound, no guarding, no  tenderness at McBurney's point and negative Murphy's sign. No hernia.  Musculoskeletal: Normal range of motion.  Neurological: He is alert and oriented to person, place, and time. He has normal strength. No cranial nerve deficit or sensory deficit. Coordination normal. GCS eye subscore is 4. GCS verbal subscore is 5. GCS motor subscore is 6.  Skin: Skin is warm, dry and intact. No rash noted. No cyanosis.  Psychiatric: He has a normal mood and affect. His speech is normal and behavior is normal. Thought content normal.  Nursing note and vitals reviewed.    ED Treatments / Results  Labs (all labs ordered are listed, but only abnormal results are displayed) Labs Reviewed  CBC WITH DIFFERENTIAL/PLATELET - Abnormal; Notable for the following components:      Result Value   MCV 102.6 (*)    MCH 35.7 (*)    Platelets 128 (*)    All other components within normal limits  COMPREHENSIVE METABOLIC PANEL - Abnormal; Notable for the following components:   Glucose, Bld 124 (*)    All other components within normal limits  I-STAT CG4 LACTIC ACID, ED - Abnormal; Notable for the following components:   Lactic Acid, Venous 4.37 (*)    All other components within normal limits  LIPASE, BLOOD    EKG None  Radiology Ct Abdomen Pelvis W Contrast  Result Date: 02/10/2018 CLINICAL DATA:  52 year old male with abdominal pain. EXAM: CT ABDOMEN AND PELVIS WITH CONTRAST TECHNIQUE: Multidetector CT imaging of the abdomen and pelvis was performed using the standard protocol following bolus administration of intravenous contrast. CONTRAST:  184m ISOVUE-300 IOPAMIDOL (ISOVUE-300) INJECTION 61% COMPARISON:  Abdominal CT dated 12/29/2017 FINDINGS: Lower chest: The visualized lung bases are clear. No intra-abdominal free air or free fluid. Hepatobiliary: No focal liver abnormality is seen. No gallstones, gallbladder wall thickening, or biliary dilatation. Pancreas: Unremarkable. No pancreatic ductal dilatation or  surrounding inflammatory changes. Spleen: Normal in size without focal abnormality. Adrenals/Urinary Tract: The adrenal glands are unremarkable. Left renal cortical scarring and parenchymal irregularity. The right kidney is unremarkable. There is symmetric enhancement and excretion of contrast by both kidneys. The urinary bladder is distended and grossly unremarkable. Stomach/Bowel: Postsurgical  changes of colectomy with a right lower quadrant ileostomy. There is herniation of multiple loops of small bowel into the ostomy. There is fecalization of the parastomal loops and dilatation of loops of small bowel proximal to the ileostomy measuring up to 3.5 cm in caliber. Overall there has been slight interval increase in the size of the bowel loops compared to the prior CT and interval increase in the surrounding edema. Findings most consistent with a degree obstruction within the hernia. There is inflammatory changes and small amount of fluid surrounding the herniated loops of small bowel and in the adjacent subcutaneous fat. Vascular/Lymphatic: Mild aortoiliac atherosclerotic disease. No portal venous gas. There is no adenopathy. Reproductive: The prostate and seminal vesicles are grossly unremarkable. No pelvic mass. Other: Midline vertical anterior abdominal wall incisional scar. Musculoskeletal: Right hip arthroplasty. No acute osseous pathology. IMPRESSION: Findings most consistent with a degree of small-bowel obstruction within the parastomal hernia. Overall there has been interval increase in the caliber of the distal small bowel as well as increased associated inflammatory changes of these loops of bowel compared to the prior CT. Electronically Signed   By: Anner Crete M.D.   On: 02/10/2018 02:17    Procedures Procedures (including critical care time)  Medications Ordered in ED Medications  sodium chloride 0.9 % bolus 500 mL (0 mLs Intravenous Stopped 02/10/18 0221)  HYDROmorphone (DILAUDID)  injection 1 mg (1 mg Intravenous Given 02/09/18 2357)  ondansetron (ZOFRAN) injection 4 mg (4 mg Intravenous Given 02/09/18 2357)  iopamidol (ISOVUE-300) 61 % injection 100 mL (100 mLs Intravenous Contrast Given 02/10/18 0124)     Initial Impression / Assessment and Plan / ED Course  I have reviewed the triage vital signs and the nursing notes.  Pertinent labs & imaging results that were available during my care of the patient were reviewed by me and considered in my medical decision making (see chart for details).     Patient presents to the emergency department for evaluation of abdominal pain with distention.  Examination reveals diffuse tenderness.  He does appear to have increased distention around the area of his ostomy.  He has had previous small bowel obstruction.  Recurrent obstruction was felt likely.  He does not have an elevated white blood cell count or fever.  He does have an elevated lactic acid, likely secondary to obstruction which has been confirmed on CT scan.  Patient administered analgesia and IV fluids.  NG tube will be placed and he will be admitted.  Final Clinical Impressions(s) / ED Diagnoses   Final diagnoses:  SBO (small bowel obstruction) [facility])    ED Discharge Orders    None       Rayana Geurin, Gwenyth Allegra, MD 02/10/18 [id number]

## 2018-02-10 ENCOUNTER — Encounter (HOSPITAL_COMMUNITY): Payer: Self-pay

## 2018-02-10 ENCOUNTER — Observation Stay (HOSPITAL_COMMUNITY): Payer: Medicaid Other

## 2018-02-10 DIAGNOSIS — Z880 Allergy status to penicillin: Secondary | ICD-10-CM | POA: Diagnosis not present

## 2018-02-10 DIAGNOSIS — K219 Gastro-esophageal reflux disease without esophagitis: Secondary | ICD-10-CM | POA: Diagnosis present

## 2018-02-10 DIAGNOSIS — Z932 Ileostomy status: Secondary | ICD-10-CM | POA: Diagnosis not present

## 2018-02-10 DIAGNOSIS — E785 Hyperlipidemia, unspecified: Secondary | ICD-10-CM | POA: Diagnosis present

## 2018-02-10 DIAGNOSIS — Z882 Allergy status to sulfonamides status: Secondary | ICD-10-CM | POA: Diagnosis not present

## 2018-02-10 DIAGNOSIS — Z886 Allergy status to analgesic agent status: Secondary | ICD-10-CM | POA: Diagnosis not present

## 2018-02-10 DIAGNOSIS — F319 Bipolar disorder, unspecified: Secondary | ICD-10-CM | POA: Diagnosis present

## 2018-02-10 DIAGNOSIS — E860 Dehydration: Secondary | ICD-10-CM | POA: Diagnosis present

## 2018-02-10 DIAGNOSIS — K5000 Crohn's disease of small intestine without complications: Secondary | ICD-10-CM | POA: Diagnosis present

## 2018-02-10 DIAGNOSIS — F17210 Nicotine dependence, cigarettes, uncomplicated: Secondary | ICD-10-CM | POA: Diagnosis present

## 2018-02-10 DIAGNOSIS — K56609 Unspecified intestinal obstruction, unspecified as to partial versus complete obstruction: Secondary | ICD-10-CM

## 2018-02-10 DIAGNOSIS — Z79899 Other long term (current) drug therapy: Secondary | ICD-10-CM | POA: Diagnosis not present

## 2018-02-10 DIAGNOSIS — Z888 Allergy status to other drugs, medicaments and biological substances status: Secondary | ICD-10-CM | POA: Diagnosis not present

## 2018-02-10 DIAGNOSIS — Z885 Allergy status to narcotic agent status: Secondary | ICD-10-CM | POA: Diagnosis not present

## 2018-02-10 DIAGNOSIS — K760 Fatty (change of) liver, not elsewhere classified: Secondary | ICD-10-CM | POA: Diagnosis present

## 2018-02-10 DIAGNOSIS — K50018 Crohn's disease of small intestine with other complication: Secondary | ICD-10-CM | POA: Diagnosis not present

## 2018-02-10 DIAGNOSIS — E872 Acidosis, unspecified: Secondary | ICD-10-CM | POA: Diagnosis present

## 2018-02-10 DIAGNOSIS — Z96611 Presence of right artificial shoulder joint: Secondary | ICD-10-CM | POA: Diagnosis present

## 2018-02-10 DIAGNOSIS — K433 Parastomal hernia with obstruction, without gangrene: Secondary | ICD-10-CM | POA: Diagnosis present

## 2018-02-10 DIAGNOSIS — Z96612 Presence of left artificial shoulder joint: Secondary | ICD-10-CM | POA: Diagnosis present

## 2018-02-10 DIAGNOSIS — K56600 Partial intestinal obstruction, unspecified as to cause: Secondary | ICD-10-CM

## 2018-02-10 DIAGNOSIS — Z9049 Acquired absence of other specified parts of digestive tract: Secondary | ICD-10-CM | POA: Diagnosis not present

## 2018-02-10 DIAGNOSIS — R109 Unspecified abdominal pain: Secondary | ICD-10-CM | POA: Diagnosis present

## 2018-02-10 DIAGNOSIS — J45909 Unspecified asthma, uncomplicated: Secondary | ICD-10-CM | POA: Diagnosis present

## 2018-02-10 DIAGNOSIS — Z96641 Presence of right artificial hip joint: Secondary | ICD-10-CM | POA: Diagnosis present

## 2018-02-10 DIAGNOSIS — Z7984 Long term (current) use of oral hypoglycemic drugs: Secondary | ICD-10-CM | POA: Diagnosis not present

## 2018-02-10 HISTORY — DX: Partial intestinal obstruction, unspecified as to cause: K56.600

## 2018-02-10 LAB — BASIC METABOLIC PANEL
Anion gap: 8 (ref 5–15)
BUN: 9 mg/dL (ref 6–20)
CALCIUM: 8.6 mg/dL — AB (ref 8.9–10.3)
CO2: 27 mmol/L (ref 22–32)
Chloride: 103 mmol/L (ref 101–111)
Creatinine, Ser: 0.74 mg/dL (ref 0.61–1.24)
Glucose, Bld: 90 mg/dL (ref 65–99)
Potassium: 4.1 mmol/L (ref 3.5–5.1)
Sodium: 138 mmol/L (ref 135–145)

## 2018-02-10 LAB — CBC WITH DIFFERENTIAL/PLATELET
BASOS PCT: 0 %
Basophils Absolute: 0 10*3/uL (ref 0.0–0.1)
EOS ABS: 0.1 10*3/uL (ref 0.0–0.7)
EOS PCT: 2 %
HCT: 41.9 % (ref 39.0–52.0)
Hemoglobin: 14.2 g/dL (ref 13.0–17.0)
LYMPHS ABS: 1.5 10*3/uL (ref 0.7–4.0)
Lymphocytes Relative: 30 %
MCH: 35.1 pg — AB (ref 26.0–34.0)
MCHC: 33.9 g/dL (ref 30.0–36.0)
MCV: 103.5 fL — ABNORMAL HIGH (ref 78.0–100.0)
Monocytes Absolute: 0.4 10*3/uL (ref 0.1–1.0)
Monocytes Relative: 7 %
NEUTROS PCT: 61 %
Neutro Abs: 3.1 10*3/uL (ref 1.7–7.7)
Platelets: 114 10*3/uL — ABNORMAL LOW (ref 150–400)
RBC: 4.05 MIL/uL — AB (ref 4.22–5.81)
RDW: 14.6 % (ref 11.5–15.5)
WBC: 5.1 10*3/uL (ref 4.0–10.5)

## 2018-02-10 LAB — COMPREHENSIVE METABOLIC PANEL
ALK PHOS: 51 U/L (ref 38–126)
ALT: 25 U/L (ref 17–63)
AST: 27 U/L (ref 15–41)
Albumin: 3.5 g/dL (ref 3.5–5.0)
Anion gap: 14 (ref 5–15)
BILIRUBIN TOTAL: 1.1 mg/dL (ref 0.3–1.2)
BUN: 15 mg/dL (ref 6–20)
CALCIUM: 9.3 mg/dL (ref 8.9–10.3)
CO2: 24 mmol/L (ref 22–32)
CREATININE: 0.92 mg/dL (ref 0.61–1.24)
Chloride: 102 mmol/L (ref 101–111)
Glucose, Bld: 124 mg/dL — ABNORMAL HIGH (ref 65–99)
Potassium: 4.2 mmol/L (ref 3.5–5.1)
Sodium: 140 mmol/L (ref 135–145)
Total Protein: 6.6 g/dL (ref 6.5–8.1)

## 2018-02-10 LAB — MAGNESIUM: Magnesium: 1.7 mg/dL (ref 1.7–2.4)

## 2018-02-10 LAB — LACTIC ACID, PLASMA
LACTIC ACID, VENOUS: 2.2 mmol/L — AB (ref 0.5–1.9)
Lactic Acid, Venous: 3 mmol/L (ref 0.5–1.9)

## 2018-02-10 LAB — VITAMIN B12: Vitamin B-12: 250 pg/mL (ref 180–914)

## 2018-02-10 LAB — LIPASE, BLOOD: LIPASE: 27 U/L (ref 11–51)

## 2018-02-10 LAB — FOLATE: FOLATE: 37 ng/mL (ref >5.9)

## 2018-02-10 LAB — I-STAT CG4 LACTIC ACID, ED: LACTIC ACID, VENOUS: 4.37 mmol/L — AB (ref 0.5–1.9)

## 2018-02-10 LAB — VALPROIC ACID LEVEL: VALPROIC ACID LVL: 53 ug/mL (ref 50.0–100.0)

## 2018-02-10 MED ORDER — INSULIN ASPART 100 UNIT/ML ~~LOC~~ SOLN: 0.0000 [IU] | Freq: Three times a day (TID) | SUBCUTANEOUS | Status: DC

## 2018-02-10 MED ORDER — SODIUM CHLORIDE 0.9 % IV SOLN
INTRAVENOUS | Status: DC
  Administered 2018-02-10 – 2018-02-12 (×8): via INTRAVENOUS

## 2018-02-10 MED ORDER — ACETAMINOPHEN 325 MG PO TABS
650.0000 mg | ORAL_TABLET | Freq: Four times a day (QID) | ORAL | Status: DC | PRN
  Administered 2018-02-13: 650 mg via ORAL
  Filled 2018-02-10: qty 2

## 2018-02-10 MED ORDER — ACETAMINOPHEN 650 MG RE SUPP: 650.0000 mg | Freq: Four times a day (QID) | RECTAL | Status: DC | PRN

## 2018-02-10 MED ORDER — ONDANSETRON HCL 4 MG/2ML IJ SOLN
4.0000 mg | Freq: Four times a day (QID) | INTRAMUSCULAR | Status: DC | PRN
  Administered 2018-02-11 (×2): 4 mg via INTRAVENOUS
  Filled 2018-02-10 (×2): qty 2

## 2018-02-10 MED ORDER — FAMOTIDINE IN NACL 20-0.9 MG/50ML-% IV SOLN
20.0000 mg | Freq: Two times a day (BID) | INTRAVENOUS | Status: DC
  Administered 2018-02-10 – 2018-02-13 (×8): 20 mg via INTRAVENOUS
  Filled 2018-02-10 (×8): qty 50

## 2018-02-10 MED ORDER — IOPAMIDOL (ISOVUE-300) INJECTION 61%
100.0000 mL | Freq: Once | INTRAVENOUS | Status: AC | PRN
  Administered 2018-02-10: 100 mL via INTRAVENOUS

## 2018-02-10 MED ORDER — MAGNESIUM SULFATE 2 GM/50ML IV SOLN
2.0000 g | Freq: Once | INTRAVENOUS | Status: AC
  Administered 2018-02-10: 2 g via INTRAVENOUS
  Filled 2018-02-10: qty 50

## 2018-02-10 MED ORDER — SODIUM CHLORIDE 0.9 % IV BOLUS
500.0000 mL | Freq: Once | INTRAVENOUS | Status: AC
Start: 2018-02-10 — End: 2018-02-10
  Administered 2018-02-10: 500 mL via INTRAVENOUS

## 2018-02-10 MED ORDER — ONDANSETRON HCL 4 MG PO TABS: 4.0000 mg | ORAL_TABLET | Freq: Four times a day (QID) | ORAL | Status: DC | PRN

## 2018-02-10 MED ORDER — HYDROMORPHONE HCL 1 MG/ML IJ SOLN
1.0000 mg | INTRAMUSCULAR | Status: DC | PRN
  Administered 2018-02-10 – 2018-02-13 (×14): 1 mg via INTRAVENOUS
  Filled 2018-02-10 (×14): qty 1

## 2018-02-10 MED ORDER — SODIUM CHLORIDE 0.9 % IV BOLUS
250.0000 mL | Freq: Once | INTRAVENOUS | Status: AC
  Administered 2018-02-10: 250 mL via INTRAVENOUS

## 2018-02-10 NOTE — H&P (Signed)
History and Physical    [name] ESP:[id number] DOB: 04/14/1966 DOA: 02/09/2018  PCP: [name], MD   Patient coming from: Home.  I have personally briefly reviewed patient's old medical records in [place]  Chief Complaint: Abdominal pain.  HPI: [name] is a 52 y.o. 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   Per patient, he has been having on/off low intensity abdominal pain since the weekend and earlier this week.  However, since 1400 yesterday afternoon the pain became very intense. Since then his abdomen became progressively more distended and his ileostomy output has decreased from 6 bags changes regularly to only two yesterday.  He denies nausea, emesis, melena or hematochezia.  He denies fever, chills, sore throat, dyspnea, wheezing, chest pain, palpitations, diaphoresis, PND, orthopnea or pitting edema of the lower extremities.  No dysuria, frequency, hematuria or oliguria.  Denies heat or cold intolerance.  No polyuria, polydipsia, polyphagia or blurred vision.  No skin pruritus or rashes.  ED Course: Initial vital signs temperature 97.8 F, pulse 91, respiration 20, blood pressure 121/61 mmHg and O2 sat was 98% on room air.  The patient received a 500 mL normal saline bolus, hydromorphone 1 mg IVP x1 and ondansetron 4 mg IVP x1 in the emergency department.  His lab work showed a white count of 5.5 with a normal differential, hemoglobin 15.3 g/dL (MCV 102.6 fL) and platelets 128.His CMP showed a glucose of 124 mg/dL,  all other values were normal.  Lipase was 27 U/L.  Magnesium 1.7 mg/dL.  First lactic acid was 4.37 and second lactic acid level was 3.0 mmol/L.    Imaging: CT of the abdomen/pelvis with contrast showed findings consistent with the degree of small bowel obstruction within the parastomal hernia.  There has been interval increase in the caliber of the distal small bowel as well increase in the associated inflammatory changes all these loops when compared to previous studies.  Please see images and full radiology report for further detail.    Review of Systems: As per HPI otherwise 10 point review of systems negative.   Past Medical History:  Diagnosis Date  . Arthritis   . Asthma   . 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 Bipolar disorder ([place])   . Colostomy in place [facility])   . Crohn's colitis ([place])    s/p total colectomy with ileostomy in 2009  . Crohn's disease of small intestine [facility]) Sept 2012   ileoscopy: multiple ulcers likely secondary to Crohn's  . DEEP VENOUS THROMBOPHLEBITIS, HX OF 02/11/2009   Qualifier: Diagnosis of  By: Zeb Comfort    . Depression   . DVT (deep venous thrombosis) ([place]) 2009   right upper extremity due to PICC  . Enterococcus UTI 2009  . GERD (gastroesophageal reflux disease)   . Hernia   . Hyperlipidemia   . Insomnia   . Migraine headache   . Nystagmus   . PULMONARY EMBOLISM, HX OF 02/11/2009   Qualifier: Diagnosis of  By: Zeb Comfort    . S/P endoscopy Sept  2012   mild gastritis, small hiatal hernia, no ulcers  . Schizophrenia ([place])   . Schizophrenia, acute [facility])     Past Surgical History:  Procedure Laterality Date  . APPENDECTOMY    . ESOPHAGOGASTRODUODENOSCOPY  07/2010   gastritis,  bx neg for H.Pylori  . ESOPHAGOGASTRODUODENOSCOPY N/A 01/08/2014   SLF:NO SOURCE FOR ODYNOPHAGIA IDENTIFIED/Multiple small ulcers in the gastric antrum  . EYE SURGERY    . ILEOSCOPY  06/29/2011   SLF:  ulcers,multiple/small HH/mild gastritis  . KIDNEY SURGERY    . LAPAROTOMY N/A 06/05/2013   Procedure: EXPLORATORY LAPAROTOMY;  Surgeon: Donato Heinz, MD;  Location: [facility];  Service: General;  Laterality: N/A;  . LYSIS OF ADHESION N/A 06/05/2013   Procedure: LYSIS OF ADHESIONS;  Surgeon: Donato Heinz, MD;  Location: [facility];  Service: General;  Laterality: N/A;  . small bowel capsule study  08/2010   few tiny nonbleeding erosions/ulcers ?secondary to relafen or Crohn's. Entire small bowel not seen.   Marland Kitchen TOTAL COLECTOMY  2009   with ileostomy at [facility] for refractory disease   . TOTAL HIP ARTHROPLASTY     right, due to avascular necrosis from chronic prednisone  . TOTAL SHOULDER REPLACEMENT     bilateral     reports that he has been smoking cigarettes.  He has been smoking about 1.00 pack per day. He has never used smokeless tobacco. He reports that he does not drink alcohol or use drugs.  Allergies  Allergen Reactions  . Aspirin     REACTION: unknown reaction  . Bactrim [Sulfamethoxazole-Trimethoprim]     ABD CRAMPS AND DIARRHEA  . Codeine     REACTION: unknown reaction  . Ibuprofen     REACTION: unknown reaction  . Penicillins     REACTION: unknown reaction  . Remicade [Infliximab]     COULDN'T BREATHE  . Tramadol Hcl     REACTION: unknown reaction  . Vicodin [Hydrocodone-Acetaminophen] Nausea Only   Family History  Problem Relation Age of Onset  . Diabetes Mother   . Heart disease Mother   . Diabetes Father   . Heart disease Father    Prior to Admission medications   Medication Sig Start Date End Date Taking? Authorizing Provider  atorvastatin (LIPITOR) 80 MG tablet Take 80 mg by mouth daily.   Yes [provider]  benztropine (COGENTIN) 0.5 MG tablet Take 0.5 mg by mouth daily.    Yes [provider]  Calcium Carbonate-Vitamin D (CALCIUM 600+D) 600-400 MG-UNIT tablet 1 PO TID AFTER A MEAL 07/13/17  Yes Fields, Sandi L, MD  divalproex (DEPAKOTE ER)  250 MG 24 hr tablet Take 250 mg by mouth daily.    Yes [provider]  divalproex (DEPAKOTE) 500 MG 24 hr tablet Take 1,000 mg by mouth every evening.    Yes [provider]  folic acid (FOLVITE) 1 MG tablet Take 1 mg by mouth daily.     Yes [provider]  furosemide (LASIX) 40 MG tablet Take 40 mg by mouth daily.     Yes [provider]  gabapentin (NEURONTIN) 300 MG capsule Take 900 mg by mouth 3 (three) times daily.    Yes [provider]  Incontinence Supply Disposable (PROCARE ADULT BRIEFS LARGE) MISC Use as directed 03/25/16  Yes Fields, Sandi L, MD  mercaptopurine (PURINETHOL) 50 MG tablet TAKE 2 TABLETS DAILY ON EMPTY STOMACH 1 HOUR BEFORE OR 2 HOURS AFTER M EAL 02/02/18  Yes Fields, Marga Melnick, MD  metFORMIN (GLUCOPHAGE) 500 MG tablet Take by mouth 2 (two) times daily with a meal.   Yes [provider]  mirtazapine (REMERON) 15 MG tablet Take 15 mg by mouth at bedtime.   Yes [provider]  pantoprazole (PROTONIX) 40 MG tablet TAKE (1) TABLET BY MOUTH TWICE A DAY BEFORE MEALS. (BREAKFAST AND SUPPER) 12/16/17  Yes Mahala Menghini, PA-C  potassium chloride SA (K-DUR,KLOR-CON) 20 MEQ tablet Take 20 mEq by mouth daily.    Yes [provider]  risperiDONE (RISPERDAL) 2 MG tablet Take 2 mg by mouth at bedtime. 3.5 tabs qhs   Yes [provider]  sertraline (ZOLOFT) 50 MG tablet Take 50 mg by mouth daily.   Yes [provider]  tamsulosin (FLOMAX) 0.4 MG CAPS capsule Take 0.4 mg by mouth daily.   Yes [provider]  acetaminophen (TYLENOL) 500 MG tablet Take 1,000 mg by mouth every 6 (six) hours as needed. For pain     [provider]  albuterol (PROAIR HFA) 108 (90 BASE) MCG/ACT inhaler Inhale 2 puffs into the lungs every 6 (six) hours as needed for wheezing or shortness of breath.    [provider]  albuterol (PROVENTIL) (2.5 MG/3ML) 0.083% nebulizer solution Take 2.5 mg by  nebulization every 6 (six) hours as needed for wheezing or shortness of breath.    [provider]  dicyclomine (BENTYL) 10 MG capsule TAKE 1 OR 2 CAPSULE BY MOUTH AS DIRECTED BEFORE MEALS AND AT BEDTIME. 03/11/17   Annitta Needs, NP  lidocaine (XYLOCAINE) 2 % solution 2 TSP  PO Q4-6H PRN FOR HEARTBURN OR UPPER ABDOMINAL PAIN 07/13/17   Danie Binder, MD    Physical Exam: Vitals:   02/09/18 2250 02/09/18 2251 02/10/18 0339 02/10/18 0400  BP: 121/61  104/76 101/70  Pulse: 91  83 82  Resp: 20  14 16   Temp: 97.8 F (36.6 C)  98.2 F (36.8 C)   TempSrc: Oral  Oral   SpO2: 98%  98% 92%  Weight:  88.5 kg (195 lb)    Height:  5' 6"  (1.676 m)      Constitutional: NAD, calm, comfortable Eyes: PERRL, lids and conjunctivae normal ENMT: Mucous membranes are moist. Posterior pharynx clear of any exudate or lesions. Neck: Normal, supple, no masses, no thyromegaly Respiratory: Clear to auscultation bilaterally, no wheezing, no crackles. Normal respiratory effort. No accessory muscle use.  Cardiovascular: Regular rate and rhythm, no murmurs / rubs / gallops. No extremity edema. 2+ pedal pulses. No carotid bruits.  Abdomen: Distended, midline surgical scar, positive ileostomy bag with a small amount of bilious looking fluid, soft, generalized tenderness, no masses palpated. No hepatosplenomegaly. Bowel sounds positive.  Musculoskeletal: no clubbing / cyanosis. Good ROM, no contractures. Normal muscle tone.  Skin: Positive comedogenic acne and hypopigmented macules on upper back. Neurologic: CN 2-12 grossly intact. Sensation intact, DTR normal. Strength 5/5 in all 4.  Psychiatric: Normal judgment and insight. Alert and oriented x 4. Normal mood.   Labs on Admission: I have personally reviewed following labs and imaging studies  CBC: Recent Labs  Lab 02/09/18 2337  WBC 5.5  NEUTROABS 3.6  HGB 15.3  HCT 44.0  MCV 102.6*  PLT 295*   Basic Metabolic Panel: Recent Labs  Lab  02/09/18 2337  NA 140  K 4.2  CL 102  CO2 24  GLUCOSE 124*  BUN 15  CREATININE 0.92  CALCIUM 9.3  MG 1.7   GFR: Estimated Creatinine Clearance: 97.9  mL/min (by C-G formula based on SCr of 0.92 mg/dL). Liver Function Tests: Recent Labs  Lab 02/09/18 2337  AST 27  ALT 25  ALKPHOS 51  BILITOT 1.1  PROT 6.6  ALBUMIN 3.5   Recent Labs  Lab 02/09/18 2337  LIPASE 27   No results for input(s): AMMONIA in the last 168 hours. Coagulation Profile: No results for input(s): INR, PROTIME in the last 168 hours. Cardiac Enzymes: No results for input(s): CKTOTAL, CKMB, CKMBINDEX, TROPONINI in the last 168 hours. BNP (last 3 results) No results for input(s): PROBNP in the last 8760 hours. HbA1C: No results for input(s): HGBA1C in the last 72 hours. CBG: No results for input(s): GLUCAP in the last 168 hours. Lipid Profile: No results for input(s): CHOL, HDL, LDLCALC, TRIG, CHOLHDL, LDLDIRECT in the last 72 hours. Thyroid Function Tests: No results for input(s): TSH, T4TOTAL, FREET4, T3FREE, THYROIDAB in the last 72 hours. Anemia Panel: No results for input(s): VITAMINB12, FOLATE, FERRITIN, TIBC, IRON, RETICCTPCT in the last 72 hours. Urine analysis:    Component Value Date/Time   COLORURINE YELLOW 12/29/2017 1140   APPEARANCEUR HAZY (A) 12/29/2017 1140   LABSPEC 1.017 12/29/2017 1140   PHURINE 5.0 12/29/2017 1140   GLUCOSEU NEGATIVE 12/29/2017 1140   HGBUR SMALL (A) 12/29/2017 1140   BILIRUBINUR NEGATIVE 12/29/2017 1140   KETONESUR NEGATIVE 12/29/2017 1140   PROTEINUR NEGATIVE 12/29/2017 1140   UROBILINOGEN 0.2 10/17/2014 1940   NITRITE POSITIVE (A) 12/29/2017 1140   LEUKOCYTESUR LARGE (A) 12/29/2017 1140    Radiological Exams on Admission: Ct Abdomen Pelvis W Contrast  Result Date: 02/10/2018 CLINICAL DATA:  52 year old male with abdominal pain. EXAM: CT ABDOMEN AND PELVIS WITH CONTRAST TECHNIQUE: Multidetector CT imaging of the abdomen and pelvis was performed using  the standard protocol following bolus administration of intravenous contrast. CONTRAST:  159m ISOVUE-300 IOPAMIDOL (ISOVUE-300) INJECTION 61% COMPARISON:  Abdominal CT dated 12/29/2017 FINDINGS: Lower chest: The visualized lung bases are clear. No intra-abdominal free air or free fluid. Hepatobiliary: No focal liver abnormality is seen. No gallstones, gallbladder wall thickening, or biliary dilatation. Pancreas: Unremarkable. No pancreatic ductal dilatation or surrounding inflammatory changes. Spleen: Normal in size without focal abnormality. Adrenals/Urinary Tract: The adrenal glands are unremarkable. Left renal cortical scarring and parenchymal irregularity. The right kidney is unremarkable. There is symmetric enhancement and excretion of contrast by both kidneys. The urinary bladder is distended and grossly unremarkable. Stomach/Bowel: Postsurgical changes of colectomy with a right lower quadrant ileostomy. There is herniation of multiple loops of small bowel into the ostomy. There is fecalization of the parastomal loops and dilatation of loops of small bowel proximal to the ileostomy measuring up to 3.5 cm in caliber. Overall there has been slight interval increase in the size of the bowel loops compared to the prior CT and interval increase in the surrounding edema. Findings most consistent with a degree obstruction within the hernia. There is inflammatory changes and small amount of fluid surrounding the herniated loops of small bowel and in the adjacent subcutaneous fat. Vascular/Lymphatic: Mild aortoiliac atherosclerotic disease. No portal venous gas. There is no adenopathy. Reproductive: The prostate and seminal vesicles are grossly unremarkable. No pelvic mass. Other: Midline vertical anterior abdominal wall incisional scar. Musculoskeletal: Right hip arthroplasty. No acute osseous pathology. IMPRESSION: Findings most consistent with a degree of small-bowel obstruction within the parastomal hernia.  Overall there has been interval increase in the caliber of the distal small bowel as well as increased associated inflammatory changes of these loops of bowel  compared to the prior CT. Electronically Signed   By: Anner Crete M.D.   On: 02/10/2018 02:17   Dg Chest Portable 1 View  Result Date: 02/10/2018 CLINICAL DATA:  Verify gastric tube placement EXAM: PORTABLE CHEST 1 VIEW COMPARISON:  12/29/2017 FINDINGS: The heart size and mediastinal contours are within normal limits. Aortic atherosclerosis at the arch without aneurysm. A gastric tube is seen extending below the left hemidiaphragm and is coiled in the expected location of the stomach. Both lungs are clear. Port catheter tip terminates in the mid SVC. Partially included shoulder arthroplasties are noted without complicating features. IMPRESSION: Gastric tube extends into the expected location of the stomach. Clear lungs. Electronically Signed   By: Ashley Royalty M.D.   On: 02/10/2018 03:30    EKG: Independently reviewed.   Assessment/Plan Principal Problem:   SBO (small bowel obstruction) (HCC) Observation/MedSurg. Keep n.p.o. Continue IV fluids. Follow-up lactic acid level. Hydromorphone 1 mg IVP every 4 hours as needed for pain. Ondansetron 4 mg IVP every 6 hours as needed for nausea or emesis. Famotidine 20 mg IVP every 12 hours. Consult general surgery.  Active Problems:   Lactic acidosis Trending down. Continue IV hydration. Follow-up lactic acid level later today.    Hyperlipidemia Hold atorvastatin for now.    BIPOLAR AFFECTIVE DISORDER Check valproic acid level. Convert from oral to IV while n.p.o.    GERD On famotidine IV while n.p.o.    Crohn's disease of small intestine (HCC) Hold mercaptopurine while n.p.o. Resume once clear for oral intake.    Hepatic steatosis Advised to decrease BMI to avoid progression to cirrhosis. Monitor LFTs closely, particularly since the patient is on high-dose  atorvastatin.    DVT prophylaxis: SCDs. Code Status: Full code. Family Communication:  Disposition Plan: Admit for NGT low intermittent suction, IV hydration and symptoms management. Consults called: Routine general surgery consult. Admission status: Observation/MedSurg.   Reubin Milan MD [facility] Pager [phone].  If 7PM-7AM, please contact night-coverage [email or web address] Password [email or web address]  02/10/2018, 5:19 AM

## 2018-02-10 NOTE — Progress Notes (Signed)
[facility]  Attempted digitalization and replaced ostomy appliance. Unable to get to fascia but some gas released. Red rubber attempted passing but did not get all the way through to the intraabdominal bowel, removed.  Monitor, can have some sips/ chips for comfort.  Will have to get back in touch with [facility] Monday since no Colorectal available today.  Curlene Labrum, MD [facility] [address] [place], [place] [address] [phone] (office)

## 2018-02-10 NOTE — Progress Notes (Signed)
CRITICAL VALUE ALERT  Critical Value:  Lactic Acid 2.2  Date & Time Notied:  02/10/2018 0825   Provider Notified: Wynetta Emery, Attending MD  Orders Received/Actions taken: awaiting further instructions

## 2018-02-10 NOTE — ED Notes (Signed)
CRITICAL VALUE ALERT  Critical Value:  Lactic Acid 4.37  Date & Time Notied:  02/10/18 & 0033 hrs  Provider Notified: Dr. Betsey Holiday  Orders Received/Actions taken: N/A

## 2018-02-10 NOTE — ED Notes (Signed)
CRITICAL VALUE ALERT  Critical Value:  Lactic Acid 3.0  Date & Time Notied:  02/10/18 & 0353 hrs  Provider Notified: Dr. Olevia Bowens  Orders Received/Actions taken: N/A

## 2018-02-10 NOTE — Progress Notes (Signed)
02/09/2018 10:47 PM  02/10/2018 10:08 AM  [name] was seen and examined.  The H&P by the admitting provider, orders, imaging was reviewed.  Please see new orders.  Will continue to follow.  Requesting a general surgery evaluation.  Continue supportive care.   Murvin Natal, MD [facility]

## 2018-02-10 NOTE — Consult Note (Signed)
[facility] Consult  Reason for Consult: Parastomal hernia,  SBO  Referring Physician: Dr. Olevia Lane Dr. Wynetta Lane   Chief Complaint    Abdominal Pain      [name] is a 52 y.o. male.  HPI: [name] is a 52 yo with a history of Crohn's s/p proctocolectomy in 2009 with end ileostomy at [facility] by Dr. Morton Lane, Colorectal. He has been known to have a parastomal hernia for sometime now and has had some prior SBO in the past that required LOA but no revision of the hernia. He presented a few weeks ago with some concern for SBO with some fecalized bowel but the CT appeared that the obstruction was just proximal to the fascia of the parastomal hernia, but now on his repeat CT he has fecalization in his parastomal hernia and concern for a pSBO in the hernia itself. He presented with abdominal pain around the hernia site and decreased ileostomy output down from 6 times emptying his pouch to 2 times yesterday.  He has reassuring labs except for a slightly elevated lactic acid that responded to fluids and is now 2.2 from 4.  He continues to have some output from the ostomy but less than his normal.   Past Medical History:  Diagnosis Date  . Arthritis   . Asthma   . 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 Bipolar disorder ([place])   . Colostomy in place [facility])   . Crohn's colitis ([place])    s/p total colectomy with ileostomy in 2009  . Crohn's disease of small intestine [facility]) Sept 2012   ileoscopy: multiple ulcers likely secondary to Crohn's  . DEEP VENOUS THROMBOPHLEBITIS, HX OF 02/11/2009   Qualifier: Diagnosis of  By: [name]    . Depression   . DVT (deep venous thrombosis) ([place]) 2009   right upper extremity due to PICC  . Enterococcus UTI 2009  . GERD (gastroesophageal reflux disease)   . Hernia   . Hyperlipidemia   . Insomnia   . Migraine headache   . Nystagmus   . PULMONARY EMBOLISM, HX OF 02/11/2009    Qualifier: Diagnosis of  By: [name]    . S/P endoscopy Sept 2012   mild gastritis, small hiatal hernia, no ulcers  . Schizophrenia ([place])   . Schizophrenia, acute [facility])     Past Surgical History:  Procedure Laterality Date  . APPENDECTOMY    . ESOPHAGOGASTRODUODENOSCOPY  07/2010   gastritis,  bx neg for H.Pylori  . ESOPHAGOGASTRODUODENOSCOPY N/A 01/08/2014   SLF:NO SOURCE FOR ODYNOPHAGIA IDENTIFIED/Multiple small ulcers in the gastric antrum  . EYE SURGERY    . ILEOSCOPY  06/29/2011   SLF: ulcers,multiple/small HH/mild gastritis  . KIDNEY SURGERY    . LAPAROTOMY N/A 06/05/2013   Procedure: EXPLORATORY LAPAROTOMY;  Surgeon: [name] Heinz, MD;  Location: [facility];  Service: General;  Laterality: N/A;  . LYSIS OF ADHESION N/A 06/05/2013   Procedure: LYSIS OF ADHESIONS;  Surgeon: [name] Heinz, MD;  Location: [facility];  Service: General;  Laterality: N/A;  . small bowel capsule study  08/2010   few tiny nonbleeding erosions/ulcers ?secondary to relafen or Crohn's. Entire small bowel not seen.   Marland Kitchen TOTAL COLECTOMY  2009   with ileostomy at [facility] for refractory disease   . TOTAL HIP ARTHROPLASTY     right, due to avascular necrosis from chronic prednisone  . TOTAL SHOULDER REPLACEMENT  bilateral    Family History  Problem Relation Age of Onset  . Diabetes Mother   . Heart disease Mother   . Diabetes Father   . Heart disease Father     Social History   Tobacco Use  . Smoking status: Current Every Day Smoker    Packs/day: 1.00    Types: Cigarettes  . Smokeless tobacco: Never Used  . Tobacco comment: one pack daily  Substance Use Topics  . Alcohol use: No  . Drug use: No    Medications:  I have reviewed the patient's current medications. Prior to Admission:  Medications Prior to Admission  Medication Sig Dispense Refill Last Dose  . acetaminophen (TYLENOL) 500 MG tablet Take 1,000 mg by mouth every 6 (six) hours as needed. For pain    Past Week at Unknown time  .  albuterol (PROAIR HFA) 108 (90 BASE) MCG/ACT inhaler Inhale 2 puffs into the lungs every 6 (six) hours as needed for wheezing or shortness of breath.   02/09/2018 at Unknown time  . albuterol (PROVENTIL) (2.5 MG/3ML) 0.083% nebulizer solution Take 2.5 mg by nebulization every 6 (six) hours as needed for wheezing or shortness of breath.   unknown  . atorvastatin (LIPITOR) 80 MG tablet Take 80 mg by mouth daily.   02/09/2018 at Unknown time  . benztropine (COGENTIN) 0.5 MG tablet Take 0.5 mg by mouth daily.    02/09/2018 at Unknown time  . Calcium Carbonate-Vitamin D (CALCIUM 600+D) 600-400 MG-UNIT tablet 1 PO TID AFTER A MEAL (Patient taking differently: Take 1 tablet by mouth 3 (three) times daily with meals. ) 90 tablet 11 02/09/2018 at Unknown time  . dicyclomine (BENTYL) 10 MG capsule TAKE 1 OR 2 CAPSULE BY MOUTH AS DIRECTED BEFORE MEALS AND AT BEDTIME. 60 capsule 3 02/09/2018 at Unknown time  . divalproex (DEPAKOTE ER) 250 MG 24 hr tablet Take 250 mg by mouth daily.    02/09/2018 at Unknown time  . divalproex (DEPAKOTE) 500 MG 24 hr tablet Take 1,000 mg by mouth every evening.    02/09/2018 at Unknown time  . folic acid (FOLVITE) 1 MG tablet Take 1 mg by mouth daily.     02/09/2018 at Unknown time  . furosemide (LASIX) 40 MG tablet Take 40 mg by mouth daily.     02/09/2018 at Unknown time  . gabapentin (NEURONTIN) 300 MG capsule Take 900 mg by mouth 3 (three) times daily.    02/09/2018 at Unknown time  . lidocaine (XYLOCAINE) 2 % solution 2 TSP  PO Q4-6H PRN FOR HEARTBURN OR UPPER ABDOMINAL PAIN 300 mL 11 Past Month at Unknown time  . metFORMIN (GLUCOPHAGE) 500 MG tablet Take by mouth 2 (two) times daily with a meal.   02/09/2018 at Unknown time  . mirtazapine (REMERON) 15 MG tablet Take 15 mg by mouth at bedtime.   02/09/2018 at Unknown time  . pantoprazole (PROTONIX) 40 MG tablet TAKE (1) TABLET BY MOUTH TWICE A DAY BEFORE MEALS. (BREAKFAST AND SUPPER) 60 tablet 11 02/09/2018 at Unknown time  . potassium chloride SA  (K-DUR,KLOR-CON) 20 MEQ tablet Take 20 mEq by mouth daily.    02/09/2018 at Unknown time  . risperiDONE (RISPERDAL) 2 MG tablet Take 2 mg by mouth at bedtime. 3.5 tabs qhs   02/09/2018 at Unknown time  . sertraline (ZOLOFT) 50 MG tablet Take 50 mg by mouth daily.   02/09/2018 at Unknown time  . tamsulosin (FLOMAX) 0.4 MG CAPS capsule Take 0.4 mg by mouth daily.  02/09/2018 at Unknown time   Scheduled:  Continuous: . sodium chloride 125 mL/hr at 02/10/18 0600  . famotidine (PEPCID) IV Stopped (02/10/18 0824)  . sodium chloride Stopped (02/10/18 1243)   ASN:[id number] **OR** acetaminophen, HYDROmorphone (DILAUDID) injection, ondansetron **OR** ondansetron (ZOFRAN) IV  Allergies  Allergen Reactions  . Aspirin     REACTION: unknown reaction  . Bactrim [Sulfamethoxazole-Trimethoprim]     ABD CRAMPS AND DIARRHEA  . Codeine     REACTION: unknown reaction  . Ibuprofen     REACTION: unknown reaction  . Penicillins     REACTION: unknown reaction  . Remicade [Infliximab]     COULDN'T BREATHE  . Tramadol Hcl     REACTION: unknown reaction  . Vicodin [Hydrocodone-Acetaminophen] Nausea Only     ROS:  A comprehensive review of systems was negative except for: Gastrointestinal: positive for abdominal pain and decreased ileostomy output for 2 d ays  Blood pressure 99/69, pulse 79, temperature 98 F (36.7 C), temperature source Oral, resp. rate 15, height 5' 6" (1.676 m), weight 195 lb (88.5 kg), SpO2 94 %. Physical Exam  Constitutional: He is oriented to person, place, and time. He appears well-developed and well-nourished.  HENT:  Head: Normocephalic.  Eyes: Pupils are equal, round, and reactive to light.  Cardiovascular: Normal rate.  Pulmonary/Chest: Effort normal.  Abdominal: Soft. Normal appearance. There is tenderness in the right lower quadrant. There is guarding. There is no rigidity and no rebound. A hernia is present.  Edematous ileostomy, some liquid in the bag  Neurological: He  is alert and oriented to person, place, and time.  Skin: Skin is warm and dry.  Psychiatric: He has a normal mood and affect. His behavior is normal.  Vitals reviewed.   Results: Results for orders placed or performed during the hospital encounter of 02/09/18 (from the past 48 hour(s))  CBC with Differential/Platelet     Status: Abnormal   Collection Time: 02/09/18 11:37 PM  Result Value Ref Range   WBC 5.5 4.0 - 10.5 K/uL   RBC 4.29 4.22 - 5.81 MIL/uL   Hemoglobin 15.3 13.0 - 17.0 g/dL   HCT 44.0 39.0 - 52.0 %   MCV 102.6 (H) 78.0 - 100.0 fL   MCH 35.7 (H) 26.0 - 34.0 pg   MCHC 34.8 30.0 - 36.0 g/dL   RDW 14.6 11.5 - 15.5 %   Platelets 128 (L) 150 - 400 K/uL   Neutrophils Relative % 64 %   Neutro Abs 3.6 1.7 - 7.7 K/uL   Lymphocytes Relative 25 %   Lymphs Abs 1.4 0.7 - 4.0 K/uL   Monocytes Relative 9 %   Monocytes Absolute 0.5 0.1 - 1.0 K/uL   Eosinophils Relative 2 %   Eosinophils Absolute 0.1 0.0 - 0.7 K/uL   Basophils Relative 0 %   Basophils Absolute 0.0 0.0 - 0.1 K/uL    Comment: Performed at [facility], [address] [place], [place] [address]  Comprehensive metabolic panel     Status: Abnormal   Collection Time: 02/09/18 11:37 PM  Result Value Ref Range   Sodium 140 135 - 145 mmol/L   Potassium 4.2 3.5 - 5.1 mmol/L   Chloride 102 101 - 111 mmol/L   CO2 24 22 - 32 mmol/L   Glucose, Bld 124 (H) 65 - 99 mg/dL   BUN 15 6 - 20 mg/dL   Creatinine, Ser 0.92 0.61 - 1.24 mg/dL   Calcium 9.3 8.9 - 10.3 mg/dL   Total Protein 6.6  6.5 - 8.1 g/dL   Albumin 3.5 3.5 - 5.0 g/dL   AST 27 15 - 41 U/L   ALT 25 17 - 63 U/L   Alkaline Phosphatase 51 38 - 126 U/L   Total Bilirubin 1.1 0.3 - 1.2 mg/dL   GFR calc non Af Amer >60 >60 mL/min   GFR calc Af Amer >60 >60 mL/min    Comment: (NOTE) The eGFR has been calculated using the CKD EPI equation. This calculation has not been validated in all clinical situations. eGFR's persistently <60 mL/min signify possible Chronic  Kidney Disease.    Anion gap 14 5 - 15    Comment: Performed at [facility], [address] [place], [place] [address]  Lipase, blood     Status: None   Collection Time: 02/09/18 11:37 PM  Result Value Ref Range   Lipase 27 11 - 51 U/L    Comment: Performed at [facility], [address] [place], [place] [address]  Magnesium     Status: None   Collection Time: 02/09/18 11:37 PM  Result Value Ref Range   Magnesium 1.7 1.7 - 2.4 mg/dL    Comment: Performed at [facility], [address], [place], [place] [address]  I-Stat CG4 Lactic Acid, ED     Status: Abnormal   Collection Time: 02/09/18 11:42 PM  Result Value Ref Range   Lactic Acid, Venous 4.37 (HH) 0.5 - 1.9 mmol/L  Lactic acid, plasma     Status: Abnormal   Collection Time: 02/10/18  3:12 AM  Result Value Ref Range   Lactic Acid, Venous 3.0 (HH) 0.5 - 1.9 mmol/L    Comment: CRITICAL RESULT CALLED TO, READ BACK BY AND VERIFIED WITH: [email or web address],T@ [id number] BY MATTHEWS, B 5.10.19 Performed at [facility], [address], [place], [place] [address]   Basic metabolic panel     Status: Abnormal   Collection Time: 02/10/18  7:51 AM  Result Value Ref Range   Sodium 138 135 - 145 mmol/L   Potassium 4.1 3.5 - 5.1 mmol/L   Chloride 103 101 - 111 mmol/L   CO2 27 22 - 32 mmol/L   Glucose, Bld 90 65 - 99 mg/dL   BUN 9 6 - 20 mg/dL   Creatinine, Ser 0.74 0.61 - 1.24 mg/dL   Calcium 8.6 (L) 8.9 - 10.3 mg/dL   GFR calc non Af Amer >60 >60 mL/min   GFR calc Af Amer >60 >60 mL/min    Comment: (NOTE) The eGFR has been calculated using the CKD EPI equation. This calculation has not been validated in all clinical situations. eGFR's persistently <60 mL/min signify possible Chronic Kidney Disease.    Anion gap 8 5 - 15    Comment: Performed at [facility], [address] [place], [place] [address]  CBC WITH DIFFERENTIAL     Status: Abnormal   Collection Time: 02/10/18  7:51 AM  Result Value Ref Range   WBC 5.1 4.0 - 10.5 K/uL   RBC 4.05  (L) 4.22 - 5.81 MIL/uL   Hemoglobin 14.2 13.0 - 17.0 g/dL   HCT 41.9 39.0 - 52.0 %   MCV 103.5 (H) 78.0 - 100.0 fL   MCH 35.1 (H) 26.0 - 34.0 pg   MCHC 33.9 30.0 - 36.0 g/dL   RDW 14.6 11.5 - 15.5 %   Platelets 114 (L) 150 - 400 K/uL    Comment: SPECIMEN CHECKED FOR CLOTS PLATELET COUNT CONFIRMED BY SMEAR    Neutrophils Relative % 61 %  Neutro Abs 3.1 1.7 - 7.7 K/uL   Lymphocytes Relative 30 %   Lymphs Abs 1.5 0.7 - 4.0 K/uL   Monocytes Relative 7 %   Monocytes Absolute 0.4 0.1 - 1.0 K/uL   Eosinophils Relative 2 %   Eosinophils Absolute 0.1 0.0 - 0.7 K/uL   Basophils Relative 0 %   Basophils Absolute 0.0 0.0 - 0.1 K/uL    Comment: Performed at [facility], [address], [place], [place] [address]  Valproic acid level     Status: None   Collection Time: 02/10/18  7:51 AM  Result Value Ref Range   Valproic Acid Lvl 53 50.0 - 100.0 ug/mL    Comment: Performed at [facility], [address] [place], [place] [address]  Lactic acid, plasma     Status: Abnormal   Collection Time: 02/10/18  7:51 AM  Result Value Ref Range   Lactic Acid, Venous 2.2 (HH) 0.5 - 1.9 mmol/L    Comment: CRITICAL RESULT CALLED TO, READ BACK BY AND VERIFIED WITH: DISHMON,M AT 8:25AM ON 02/10/18 BY [facility] Performed at [facility], [address] [place], [place] [address]    Personally reviewed CT and reviewed prior CT- parastomal hernia with more SB and bowel slightly edematous, fecalized bowel in hernia and proximal to the hernia consistent with SBO within the parastomal hernia   Ct Abdomen Pelvis W Contrast  Result Date: 02/10/2018 CLINICAL DATA:  52 year old male with abdominal pain. EXAM: CT ABDOMEN AND PELVIS WITH CONTRAST TECHNIQUE: Multidetector CT imaging of the abdomen and pelvis was performed using the standard protocol following bolus administration of intravenous contrast. CONTRAST:  14m ISOVUE-300 IOPAMIDOL (ISOVUE-300) INJECTION 61% COMPARISON:  Abdominal CT dated 12/29/2017  FINDINGS: Lower chest: The visualized lung bases are clear. No intra-abdominal free air or free fluid. Hepatobiliary: No focal liver abnormality is seen. No gallstones, gallbladder wall thickening, or biliary dilatation. Pancreas: Unremarkable. No pancreatic ductal dilatation or surrounding inflammatory changes. Spleen: Normal in size without focal abnormality. Adrenals/Urinary Tract: The adrenal glands are unremarkable. Left renal cortical scarring and parenchymal irregularity. The right kidney is unremarkable. There is symmetric enhancement and excretion of contrast by both kidneys. The urinary bladder is distended and grossly unremarkable. Stomach/Bowel: Postsurgical changes of colectomy with a right lower quadrant ileostomy. There is herniation of multiple loops of small bowel into the ostomy. There is fecalization of the parastomal loops and dilatation of loops of small bowel proximal to the ileostomy measuring up to 3.5 cm in caliber. Overall there has been slight interval increase in the size of the bowel loops compared to the prior CT and interval increase in the surrounding edema. Findings most consistent with a degree obstruction within the hernia. There is inflammatory changes and small amount of fluid surrounding the herniated loops of small bowel and in the adjacent subcutaneous fat. Vascular/Lymphatic: Mild aortoiliac atherosclerotic disease. No portal venous gas. There is no adenopathy. Reproductive: The prostate and seminal vesicles are grossly unremarkable. No pelvic mass. Other: Midline vertical anterior abdominal wall incisional scar. Musculoskeletal: Right hip arthroplasty. No acute osseous pathology. IMPRESSION: Findings most consistent with a degree of small-bowel obstruction within the parastomal hernia. Overall there has been interval increase in the caliber of the distal small bowel as well as increased associated inflammatory changes of these loops of bowel compared to the prior CT.  Electronically Signed   By: AAnner CreteM.D.   On: 02/10/2018 02:17   Dg Chest Portable 1 View  Result Date: 02/10/2018 CLINICAL DATA:  Verify gastric tube placement EXAM: PORTABLE CHEST 1 VIEW COMPARISON:  12/29/2017 FINDINGS: The heart size and mediastinal contours are within normal limits. Aortic atherosclerosis at the arch without aneurysm. A gastric tube is seen extending below the left hemidiaphragm and is coiled in the expected location of the stomach. Both lungs are clear. Port catheter tip terminates in the mid SVC. Partially included shoulder arthroplasties are noted without complicating features. IMPRESSION: Gastric tube extends into the expected location of the stomach. Clear lungs. Electronically Signed   By: Ashley Royalty M.D.   On: 02/10/2018 03:30    Assessment & Plan:  JAMARKIS BRANAM is a 52 y.o. male with a known parastomal hernia and a pSBO that is associated with this parastomal hernia. He has had the end ileostomy since 2009 after a total proctocolectomy with end ileostomy for Crohn's, and Dr. Morton Lane at [facility] did the surgery and has been following him in the past. He has not been since there based on the documentation since about 2015.    -Given the parastomal hernia and the prior history of Crohn's, I think it will likely be better for the patient to go back to [facility] to get this addressed where more of these types of procedures are performed.  He will likely need revision of the ostomy with a Sugarbaker/ Key hole procedure to repair the hernia, and this is not something that I do regularly, nor does my partner -Removed the 45 french Ng that was in place and not doing anything, no nausea/vomiting complaints from patient, no Ng indicated at this time, can have some ice chips  -No emergent need for any surgery, but do not this is going to resolve on its own or will at least continue to recur.   -Spoke with the on call provider at [facility], Endocrine Surgeon, Dr. Fredirick Maudlin, Md. She will let the colorectal team know about my call. In the mean time, we will keep the patient here and monitor and control his pain.   -Red rubber will be attempted to be placed down the stomal to give the patient some relief   All questions were answered to the satisfaction of the patient and he understands need for surgery at the more specialized center but no transfer planned at this time.  Will call back and talk to colorectal on Monday.    Virl Cagey 02/10/2018, 12:28 PM

## 2018-02-11 DIAGNOSIS — K56600 Partial intestinal obstruction, unspecified as to cause: Secondary | ICD-10-CM

## 2018-02-11 DIAGNOSIS — E785 Hyperlipidemia, unspecified: Secondary | ICD-10-CM

## 2018-02-11 DIAGNOSIS — E872 Acidosis: Secondary | ICD-10-CM

## 2018-02-11 DIAGNOSIS — K50018 Crohn's disease of small intestine with other complication: Secondary | ICD-10-CM

## 2018-02-11 LAB — CBC WITH DIFFERENTIAL/PLATELET
BASOS ABS: 0 10*3/uL (ref 0.0–0.1)
Basophils Relative: 0 %
EOS PCT: 1 %
Eosinophils Absolute: 0 10*3/uL (ref 0.0–0.7)
HCT: 41.5 % (ref 39.0–52.0)
Hemoglobin: 14 g/dL (ref 13.0–17.0)
LYMPHS ABS: 0.4 10*3/uL — AB (ref 0.7–4.0)
LYMPHS PCT: 9 %
MCH: 35.2 pg — AB (ref 26.0–34.0)
MCHC: 33.7 g/dL (ref 30.0–36.0)
MCV: 104.3 fL — AB (ref 78.0–100.0)
MONO ABS: 0.4 10*3/uL (ref 0.1–1.0)
Monocytes Relative: 9 %
Neutro Abs: 3.2 10*3/uL (ref 1.7–7.7)
Neutrophils Relative %: 81 %
PLATELETS: 116 10*3/uL — AB (ref 150–400)
RBC: 3.98 MIL/uL — ABNORMAL LOW (ref 4.22–5.81)
RDW: 14.4 % (ref 11.5–15.5)
WBC: 4 10*3/uL (ref 4.0–10.5)

## 2018-02-11 LAB — BASIC METABOLIC PANEL
Anion gap: 8 (ref 5–15)
BUN: 13 mg/dL (ref 6–20)
CO2: 26 mmol/L (ref 22–32)
Calcium: 8.2 mg/dL — ABNORMAL LOW (ref 8.9–10.3)
Chloride: 108 mmol/L (ref 101–111)
Creatinine, Ser: 0.65 mg/dL (ref 0.61–1.24)
GFR calc Af Amer: >60 mL/min (ref >60)
Glucose, Bld: 95 mg/dL (ref 65–99)
POTASSIUM: 4.3 mmol/L (ref 3.5–5.1)
Sodium: 142 mmol/L (ref 135–145)

## 2018-02-11 LAB — GLUCOSE, CAPILLARY
GLUCOSE-CAPILLARY: 95 mg/dL (ref 65–99)
Glucose-Capillary: 105 mg/dL — ABNORMAL HIGH (ref 65–99)
Glucose-Capillary: 105 mg/dL — ABNORMAL HIGH (ref 65–99)
Glucose-Capillary: 114 mg/dL — ABNORMAL HIGH (ref 65–99)

## 2018-02-11 LAB — PHOSPHORUS: PHOSPHORUS: 3.4 mg/dL (ref 2.5–4.6)

## 2018-02-11 LAB — MAGNESIUM: Magnesium: 1.9 mg/dL (ref 1.7–2.4)

## 2018-02-11 LAB — SEDIMENTATION RATE: Sed Rate: 2 mm/h (ref 0–16)

## 2018-02-11 MED ORDER — HEPARIN SODIUM (PORCINE) 5000 UNIT/ML IJ SOLN
5000.0000 [IU] | Freq: Three times a day (TID) | INTRAMUSCULAR | Status: DC
  Administered 2018-02-12 – 2018-02-14 (×7): 5000 [IU] via SUBCUTANEOUS
  Filled 2018-02-11 (×7): qty 1

## 2018-02-11 NOTE — Progress Notes (Signed)
PROGRESS NOTE    [name]  VFM:[id number] DOB: 1966-07-29 DOA: 02/09/2018 PCP: [name], MD    Brief Narrative:  52 year old male with a history of Crohn's disease status post total colectomy with ileostomy 2009, admitted to the hospital with abdominal pain and found to have bowel obstruction.  General surgery following.  He has not had return of bowel function as of yet.   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 Small bowel obstruction.  Is not had return of bowel function as of yet.  General surgery following, appreciate input.  Continue bowel rest.  Patient had an episode of vomiting this morning, but this has not recurred.  If he does have recurrence, will need NG tube to be replaced.  Since he has a history of chronic disease, will check ESR 2. Lactic acidosis.  Related to dehydration.  Improving with IV fluids. 3. Hyperlipidemia.  Statin currently on hold 4. Bipolar disorder. 5. Crohn's disease.  Chronically on mercaptopurine.  This is currently on hold while n.p.o.   DVT prophylaxis: heparin Code Status: Full code Family Communication: No family present Disposition Plan: Pending hospital course   Consultants:   General surgery  Procedures:     Antimicrobials:       Subjective: Continues to have abdominal pain.  Had some vomiting earlier, now resolved.  He has not had much output through ostomy.  Does not feel that he is passing any gas.  Objective: Vitals:   02/10/18 1339 02/10/18 2013 02/11/18 0445 02/11/18 1331  BP: 109/69 109/63 122/72 (!) 92/53  Pulse: 89 79 94 98  Resp:  _0 Temp: (!) 97.2 F (36.2 C) 98.3 F (36.8 C) 98.6 F (37 C) 98.1  F (36.7 C)  TempSrc: Oral Oral Oral Oral  SpO2: 95% 90% 96% 94%  Weight:      Height:        Intake/Output Summary (Last 24 hours) at 02/11/2018 1657 Last data filed at 02/11/2018 1400 Gross per 24 hour  Intake 3600 ml  Output 1750 ml  Net 1850 ml   Filed Weights   02/09/18 2251  Weight: 88.5 kg (195 lb)    Examination:  General exam: Appears calm and comfortable  Respiratory system: Clear to auscultation. Respiratory effort normal. Cardiovascular system: S1 & S2 heard, RRR. No JVD, murmurs, rubs, gallops or clicks. No pedal edema. Gastrointestinal system: Abdomen is nondistended, soft and nontender. No organomegaly or masses felt.  Sluggish bowel sounds heard.  Colostomy bag with brown-colored stool Central nervous system: Alert and oriented. No focal neurological deficits. Extremities: Symmetric 5 x 5 power. Skin: No rashes, lesions or ulcers Psychiatry: Judgement and insight appear normal. Mood & affect appropriate.     Data Reviewed: I have personally reviewed following labs and imaging studies  CBC: Recent Labs  Lab 02/09/18 2337 02/10/18 0751 02/11/18 0615  WBC 5.5 5.1 4.0  NEUTROABS 3.6 3.1 3.2  HGB 15.3 14.2 14.0  HCT 44.0 41.9 41.5  MCV 102.6* 103.5* 104.3*  PLT 128* 114* 438*   Basic Metabolic Panel: Recent Labs  Lab 02/09/18 2337 02/10/18 0751 02/11/18 0615 02/11/18 1144  NA 140 138 142  --  K 4.2 4.1 4.3  --   CL 102 103 108  --   CO2 _0 --   GLUCOSE 124* 90 95  --   BUN _1 --   CREATININE 0.92 0.74 0.65  --   CALCIUM 9.3 8.6* 8.2*  --   MG 1.7  --   --  1.9  PHOS  --   --   --  3.4   GFR: Estimated Creatinine Clearance: 112.6 mL/min (by C-G formula based on SCr of 0.65 mg/dL). Liver Function Tests: Recent Labs  Lab 02/09/18 2337  AST 27  ALT 25  ALKPHOS 51  BILITOT 1.1  PROT 6.6  ALBUMIN 3.5   Recent Labs  Lab 02/09/18 2337  LIPASE 27   No results for input(s): AMMONIA in the last 168 hours. Coagulation  Profile: No results for input(s): INR, PROTIME in the last 168 hours. Cardiac Enzymes: No results for input(s): CKTOTAL, CKMB, CKMBINDEX, TROPONINI in the last 168 hours. BNP (last 3 results) No results for input(s): PROBNP in the last 8760 hours. HbA1C: No results for input(s): HGBA1C in the last 72 hours. CBG: Recent Labs  Lab 02/11/18 0747 02/11/18 1123 02/11/18 1611  GLUCAP 105* 105* 95   Lipid Profile: No results for input(s): CHOL, HDL, LDLCALC, TRIG, CHOLHDL, LDLDIRECT in the last 72 hours. Thyroid Function Tests: No results for input(s): TSH, T4TOTAL, FREET4, T3FREE, THYROIDAB in the last 72 hours. Anemia Panel: Recent Labs    02/10/18 0751  VITAMINB12 250  FOLATE 37.0   Sepsis Labs: Recent Labs  Lab 02/09/18 2342 02/10/18 0312 02/10/18 0751  LATICACIDVEN 4.37* 3.0* 2.2*    No results found for this or any previous visit (from the past 240 hour(s)).       Radiology Studies: Ct Abdomen Pelvis W Contrast  Result Date: 02/10/2018 CLINICAL DATA:  52 year old male with abdominal pain. EXAM: CT ABDOMEN AND PELVIS WITH CONTRAST TECHNIQUE: Multidetector CT imaging of the abdomen and pelvis was performed using the standard protocol following bolus administration of intravenous contrast. CONTRAST:  139m ISOVUE-300 IOPAMIDOL (ISOVUE-300) INJECTION 61% COMPARISON:  Abdominal CT dated 12/29/2017 FINDINGS: Lower chest: The visualized lung bases are clear. No intra-abdominal free air or free fluid. Hepatobiliary: No focal liver abnormality is seen. No gallstones, gallbladder wall thickening, or biliary dilatation. Pancreas: Unremarkable. No pancreatic ductal dilatation or surrounding inflammatory changes. Spleen: Normal in size without focal abnormality. Adrenals/Urinary Tract: The adrenal glands are unremarkable. Left renal cortical scarring and parenchymal irregularity. The right kidney is unremarkable. There is symmetric enhancement and excretion of contrast by both kidneys.  The urinary bladder is distended and grossly unremarkable. Stomach/Bowel: Postsurgical changes of colectomy with a right lower quadrant ileostomy. There is herniation of multiple loops of small bowel into the ostomy. There is fecalization of the parastomal loops and dilatation of loops of small bowel proximal to the ileostomy measuring up to 3.5 cm in caliber. Overall there has been slight interval increase in the size of the bowel loops compared to the prior CT and interval increase in the surrounding edema. Findings most consistent with a degree obstruction within the hernia. There is inflammatory changes and small amount of fluid surrounding the herniated loops of small bowel and in the adjacent subcutaneous fat. Vascular/Lymphatic: Mild aortoiliac atherosclerotic disease. No portal venous gas. There is no adenopathy. Reproductive: The prostate and seminal vesicles are grossly unremarkable. No pelvic mass. Other: Midline vertical anterior abdominal wall incisional scar. Musculoskeletal: Right hip arthroplasty. No acute  osseous pathology. IMPRESSION: Findings most consistent with a degree of small-bowel obstruction within the parastomal hernia. Overall there has been interval increase in the caliber of the distal small bowel as well as increased associated inflammatory changes of these loops of bowel compared to the prior CT. Electronically Signed   By: Anner Crete M.D.   On: 02/10/2018 02:17   Dg Chest Portable 1 View  Result Date: 02/10/2018 CLINICAL DATA:  Verify gastric tube placement EXAM: PORTABLE CHEST 1 VIEW COMPARISON:  12/29/2017 FINDINGS: The heart size and mediastinal contours are within normal limits. Aortic atherosclerosis at the arch without aneurysm. A gastric tube is seen extending below the left hemidiaphragm and is coiled in the expected location of the stomach. Both lungs are clear. Port catheter tip terminates in the mid SVC. Partially included shoulder arthroplasties are noted  without complicating features. IMPRESSION: Gastric tube extends into the expected location of the stomach. Clear lungs. Electronically Signed   By: Ashley Royalty M.D.   On: 02/10/2018 03:30        Scheduled Meds: . insulin aspart  0-15 Units Subcutaneous TID WC   Continuous Infusions: . sodium chloride 125 mL/hr at 02/11/18 0937  . famotidine (PEPCID) IV Stopped (02/11/18 1007)     LOS: 1 day    Time spent: 70mns    JKathie Dike MD [facility] Pager [phone] If 7PM-7AM, please contact night-coverage [email or web address] Password [email or web address]5/08/2018, 4:57 PM

## 2018-02-11 NOTE — Progress Notes (Signed)
[facility] Progress Note     Subjective: Still with some output from the ostomy bag. Some gas. Not yet recorded. Edema of the ostomy looks better. Vitals ok. Still with pain and requiring meds q4 hrs.   Objective: Vital signs in last 24 hours: Temp:  [97.2 F (36.2 C)-98.6 F (37 C)] 98.6 F (37 C) (05/11 0445) Pulse Rate:  [79-94] 94 (05/11 0445) Resp:  [17-18] 17 (05/11 0445) BP: (82-122)/(63-72) 122/72 (05/11 0445) SpO2:  [90 %-96 %] 96 % (05/11 0445) Last BM Date: (colostomy bag)  Intake/Output from previous day: 05/10 0701 - 05/11 0700 In: 3225 [I.V.:2625; IV Piggyback:600] Out: 1750 [Urine:1750] Intake/Output this shift: Total I/O In: 925 [I.V.:875; IV Piggyback:50] Out: 300 [Urine:300]  General appearance: alert, cooperative and no distress Resp: normal work breathing GI: soft, distended, tender rigth side > left some, some guarding on right, no rigidity no rebound Ostomy pink with less edema, liquid stool in bag and gas   Lab Results:  Recent Labs    02/10/18 0751 02/11/18 0615  WBC 5.1 4.0  HGB 14.2 14.0  HCT 41.9 41.5  PLT 114* 116*   BMET Recent Labs    02/10/18 0751 02/11/18 0615  NA 138 142  K 4.1 4.3  CL 103 108  CO2 27 26  GLUCOSE 90 95  BUN 9 13  CREATININE 0.74 0.65  CALCIUM 8.6* 8.2*     Assessment/Plan: Mr. Binion is a 52 yo with pSBO associated with his parastomal hernia. He has an end ileostomy after total proctocolectomy for Crohn's. Doing fair with some output but not normal output. Still with some pain. Edema of the bowel looking better on outside of ostomy. Did have a small emesis but wanting ice chips. Labs look ok but neutrophils are starting to shift.   -NPO, NG with ice chips for comfort -Continue CBC with diff as neutrophils are increasing, monitor  -Added on Mg, Phos for today, replace to goal K 4, Phos 3, Mg 2 -Will likely ultimately need revision of this ostomy/ parastomal hernia repair, but better  completed at center that does more of this type of procedure, for now not completely obstructed and nothing pushing for surgical intervention at this time    LOS: 1 day    Virl Cagey 02/11/2018

## 2018-02-12 LAB — BASIC METABOLIC PANEL
ANION GAP: 7 (ref 5–15)
BUN: 15 mg/dL (ref 6–20)
CALCIUM: 8.1 mg/dL — AB (ref 8.9–10.3)
CO2: 27 mmol/L (ref 22–32)
CREATININE: 0.64 mg/dL (ref 0.61–1.24)
Chloride: 106 mmol/L (ref 101–111)
GLUCOSE: 72 mg/dL (ref 65–99)
Potassium: 3.8 mmol/L (ref 3.5–5.1)
Sodium: 140 mmol/L (ref 135–145)

## 2018-02-12 LAB — GLUCOSE, CAPILLARY
GLUCOSE-CAPILLARY: 91 mg/dL (ref 65–99)
GLUCOSE-CAPILLARY: 74 mg/dL (ref 65–99)
Glucose-Capillary: 90 mg/dL (ref 65–99)
Glucose-Capillary: 75 mg/dL (ref 65–99)

## 2018-02-12 LAB — CBC WITH DIFFERENTIAL/PLATELET
BASOS PCT: 1 %
Basophils Absolute: 0 10*3/uL (ref 0.0–0.1)
EOS PCT: 7 %
Eosinophils Absolute: 0.2 10*3/uL (ref 0.0–0.7)
HEMATOCRIT: 42.5 % (ref 39.0–52.0)
HEMOGLOBIN: 13.9 g/dL (ref 13.0–17.0)
LYMPHS ABS: 1.3 10*3/uL (ref 0.7–4.0)
LYMPHS PCT: 45 %
MCH: 34.5 pg — AB (ref 26.0–34.0)
MCHC: 32.7 g/dL (ref 30.0–36.0)
MCV: 105.5 fL — AB (ref 78.0–100.0)
MONOS PCT: 13 %
Monocytes Absolute: 0.4 10*3/uL (ref 0.1–1.0)
NEUTROS ABS: 1 10*3/uL — AB (ref 1.7–7.7)
NEUTROS PCT: 34 %
Platelets: 113 10*3/uL — ABNORMAL LOW (ref 150–400)
RBC: 4.03 MIL/uL — ABNORMAL LOW (ref 4.22–5.81)
RDW: 14.8 % (ref 11.5–15.5)
WBC: 2.9 10*3/uL — ABNORMAL LOW (ref 4.0–10.5)

## 2018-02-12 LAB — MAGNESIUM: Magnesium: 2 mg/dL (ref 1.7–2.4)

## 2018-02-12 LAB — PHOSPHORUS: Phosphorus: 1.6 mg/dL — ABNORMAL LOW (ref 2.5–4.6)

## 2018-02-12 MED ORDER — K PHOS MONO-SOD PHOS DI & MONO 155-852-130 MG PO TABS
500.0000 mg | ORAL_TABLET | Freq: Two times a day (BID) | ORAL | Status: DC
  Administered 2018-02-12 – 2018-02-14 (×5): 500 mg via ORAL
  Filled 2018-02-12 (×5): qty 2

## 2018-02-12 NOTE — Progress Notes (Signed)
PROGRESS NOTE    [name]  IAX:[id number] DOB: 02-13-1966 DOA: 02/09/2018 PCP: [name], MD    Brief Narrative:  52 year old male with a history of Crohn's disease status post total colectomy with ileostomy 2009, admitted to the hospital with abdominal pain and found to have bowel obstruction.  General surgery following.  He has not had return of bowel function as of yet.   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 Small bowel obstruction.  Patient has had some stool output, but is likely still partially obstructed.  General surgery following, appreciate input.  Continue bowel rest.  He is not had any recurrence of vomiting.  If he does have recurrence, will need NG tube to be replaced.   2. Lactic acidosis.  Related to dehydration.  Improving with IV fluids. 3. Hyperlipidemia.  Statin currently on hold 4. Bipolar disorder. 5. Crohn's disease.  Chronically on mercaptopurine.  This is currently on hold while n.p.o. ESR is normal   DVT prophylaxis: heparin Code Status: Full code Family Communication: No family present Disposition Plan: Pending hospital course   Consultants:   General surgery  Procedures:     Antimicrobials:       Subjective: Continues to have some abdominal pain.  Still passing some stool through colostomy.  Pain is not as bad as it was yesterday  Objective: Vitals:   02/11/18 1331 02/11/18 2059 02/12/18 0600 02/12/18 1356  BP: (!) 92/53 103/84 (!) 115/51 (!) 97/58  Pulse: 98 84 79 64  Resp: 20 17 20 18   Temp: 98.1 F (36.7 C) 98.2 F (36.8 C) 98.1 F (36.7 C) 98.2 F (36.8 C)  TempSrc: Oral Oral Oral Oral  SpO2: 94% 94% 99% 96%  Weight:       Height:        Intake/Output Summary (Last 24 hours) at 02/12/2018 1532 Last data filed at 02/12/2018 1300 Gross per 24 hour  Intake 3270 ml  Output 2450 ml  Net 820 ml   Filed Weights   02/09/18 2251  Weight: 88.5 kg (195 lb)    Examination:  General exam: Alert, awake, oriented x 3 Respiratory system: Clear to auscultation. Respiratory effort normal. Cardiovascular system:RRR. No murmurs, rubs, gallops. Gastrointestinal system: Abdomen is soft and diffusely tender. No organomegaly or masses felt. Normal bowel sounds heard. Colostomy with brown colored stool and gas Central nervous system: Alert and oriented. No focal neurological deficits. Extremities: No C/C/E, +pedal pulses Skin: No rashes, lesions or ulcers Psychiatry: Judgement and insight appear normal. Mood & affect appropriate.   Data Reviewed: I have personally reviewed following labs and imaging studies  CBC: Recent Labs  Lab 02/09/18 2337 02/10/18 0751 02/11/18 0615 02/12/18 0610  WBC 5.5 5.1 4.0 2.9*  NEUTROABS 3.6 3.1 3.2 1.0*  HGB 15.3 14.2 14.0 13.9  HCT 44.0 41.9 41.5 42.5  MCV 102.6* 103.5* 104.3* 105.5*  PLT 128* 114* 116* 078*   Basic Metabolic Panel: Recent Labs  Lab 02/09/18 2337 02/10/18 0751 02/11/18 0615 02/11/18 1144 02/12/18 0610  NA 140 138 142  --  140  K 4.2 4.1 4.3  --  3.8  CL 102 103 108  --  106  CO2 24 27 26   --  27  GLUCOSE 124* 90 95  --  72  BUN 15 9 13   --  15  CREATININE 0.92 0.74 0.65  --  0.64  CALCIUM 9.3 8.6* 8.2*  --  8.1*  MG 1.7  --   --  1.9 2.0  PHOS  --   --   --  3.4 1.6*   GFR: Estimated Creatinine Clearance: 112.6 mL/min (by C-G formula based on SCr of 0.64 mg/dL). Liver Function Tests: Recent Labs  Lab 02/09/18 2337  AST 27  ALT 25  ALKPHOS 51  BILITOT 1.1  PROT 6.6  ALBUMIN 3.5   Recent Labs  Lab 02/09/18 2337  LIPASE 27   No results for input(s): AMMONIA in the last 168 hours. Coagulation Profile: No results for input(s): INR,  PROTIME in the last 168 hours. Cardiac Enzymes: No results for input(s): CKTOTAL, CKMB, CKMBINDEX, TROPONINI in the last 168 hours. BNP (last 3 results) No results for input(s): PROBNP in the last 8760 hours. HbA1C: No results for input(s): HGBA1C in the last 72 hours. CBG: Recent Labs  Lab 02/11/18 1123 02/11/18 1611 02/11/18 2057 02/12/18 0812 02/12/18 1113  GLUCAP 105* 95 114* 91 74   Lipid Profile: No results for input(s): CHOL, HDL, LDLCALC, TRIG, CHOLHDL, LDLDIRECT in the last 72 hours. Thyroid Function Tests: No results for input(s): TSH, T4TOTAL, FREET4, T3FREE, THYROIDAB in the last 72 hours. Anemia Panel: Recent Labs    02/10/18 0751  VITAMINB12 250  FOLATE 37.0   Sepsis Labs: Recent Labs  Lab 02/09/18 2342 02/10/18 0312 02/10/18 0751  LATICACIDVEN 4.37* 3.0* 2.2*    No results found for this or any previous visit (from the past 240 hour(s)).       Radiology Studies: No results found.      Scheduled Meds: . heparin injection (subcutaneous)  5,000 Units Subcutaneous Q8H  . insulin aspart  0-15 Units Subcutaneous TID WC  . phosphorus  500 mg Oral BID   Continuous Infusions: . sodium chloride 125 mL/hr at 02/12/18 0107  . famotidine (PEPCID) IV Stopped (02/12/18 1034)     LOS: 2 days    Time spent: 83mns    JKathie Dike MD [facility] Pager [phone] If 7PM-7AM, please contact night-coverage [email or web address] Password [email or web address]5/09/2018, 3:32 PM

## 2018-02-12 NOTE — Progress Notes (Signed)
[facility] Progress Note     Subjective: No major complaints. Output increased. Pain somewhat better. Doing some sips. Sed Rate low at 2, making Crohn's flare very unlikely.   Objective: Vital signs in last 24 hours: Temp:  [98.1 F (36.7 C)-98.2 F (36.8 C)] 98.1 F (36.7 C) (05/12 0600) Pulse Rate:  [79-98] 79 (05/12 0600) Resp:  [17-20] 20 (05/12 0600) BP: (92-115)/(51-84) 115/51 (05/12 0600) SpO2:  [94 %-99 %] 99 % (05/12 0600) Last BM Date: (colostomy bag)  Intake/Output from previous day: 05/11 0701 - 05/12 0700 In: 3225 [I.V.:3125; IV Piggyback:100] Out: 2150 [Urine:800; Stool:1350] Intake/Output this shift: Total I/O In: -  Out: 400 [Urine:400]  Ostomy 1350 cc   General appearance: alert, cooperative and no distress Resp: normal work breathing GI: soft, minimally distended, tender around ostomy, output in bag, tender mostly on right abdomen, no rebound but some guarding on right lateral side  Lab Results:  Recent Labs    02/11/18 0615 02/12/18 0610  WBC 4.0 2.9*  HGB 14.0 13.9  HCT 41.5 42.5  PLT 116* 113*   BMET Recent Labs    02/11/18 0615 02/12/18 0610  NA 142 140  K 4.3 3.8  CL 108 106  CO2 26 27  GLUCOSE 95 72  BUN 13 15  CREATININE 0.65 0.64  CALCIUM 8.2* 8.1*   Assessment/Plan: Mr. Goodpasture is a 52 yo with h/o Crohn's and parastomal hernia with pSBO related to the hernia; Sed rate low, Crohn's flare unlikely. All of this related to the parastomal hernia. WBC down and neutrophils down, going slightly leukopenic. No fevers and pain slightly improved.  -Sips/ chips -Will [facility] to [facility] -Phos replaced  -Will continue to monitor, making some progress but remains partial obstructed    LOS: 2 days    Virl Cagey 02/12/2018

## 2018-02-13 LAB — CBC WITH DIFFERENTIAL/PLATELET
BASOS PCT: 0 %
Basophils Absolute: 0 10*3/uL (ref 0.0–0.1)
EOS ABS: 0.2 10*3/uL (ref 0.0–0.7)
Eosinophils Relative: 7 %
HEMATOCRIT: 38.3 % — AB (ref 39.0–52.0)
HEMOGLOBIN: 12.9 g/dL — AB (ref 13.0–17.0)
LYMPHS ABS: 1.2 10*3/uL (ref 0.7–4.0)
Lymphocytes Relative: 40 %
MCH: 34.5 pg — AB (ref 26.0–34.0)
MCHC: 33.7 g/dL (ref 30.0–36.0)
MCV: 102.4 fL — ABNORMAL HIGH (ref 78.0–100.0)
MONOS PCT: 12 %
Monocytes Absolute: 0.3 10*3/uL (ref 0.1–1.0)
NEUTROS ABS: 1.2 10*3/uL — AB (ref 1.7–7.7)
Neutrophils Relative %: 41 %
Platelets: 125 10*3/uL — ABNORMAL LOW (ref 150–400)
RBC: 3.74 MIL/uL — AB (ref 4.22–5.81)
RDW: 14.4 % (ref 11.5–15.5)
WBC: 2.9 10*3/uL — AB (ref 4.0–10.5)

## 2018-02-13 LAB — GLUCOSE, CAPILLARY
GLUCOSE-CAPILLARY: 77 mg/dL (ref 65–99)
GLUCOSE-CAPILLARY: 92 mg/dL (ref 65–99)
Glucose-Capillary: 107 mg/dL — ABNORMAL HIGH (ref 65–99)
Glucose-Capillary: 82 mg/dL (ref 65–99)

## 2018-02-13 LAB — BASIC METABOLIC PANEL
Anion gap: 5 (ref 5–15)
BUN: 8 mg/dL (ref 6–20)
CALCIUM: 8.1 mg/dL — AB (ref 8.9–10.3)
CO2: 28 mmol/L (ref 22–32)
Chloride: 108 mmol/L (ref 101–111)
Creatinine, Ser: 0.74 mg/dL (ref 0.61–1.24)
Glucose, Bld: 78 mg/dL (ref 65–99)
POTASSIUM: 3.5 mmol/L (ref 3.5–5.1)
Sodium: 141 mmol/L (ref 135–145)

## 2018-02-13 LAB — PHOSPHORUS: PHOSPHORUS: 2.6 mg/dL (ref 2.5–4.6)

## 2018-02-13 LAB — MAGNESIUM: MAGNESIUM: 1.8 mg/dL (ref 1.7–2.4)

## 2018-02-13 MED ORDER — BENZTROPINE MESYLATE 1 MG PO TABS
0.5000 mg | ORAL_TABLET | Freq: Every day | ORAL | Status: DC
  Administered 2018-02-13 – 2018-02-14 (×2): 0.5 mg via ORAL
  Filled 2018-02-13 (×2): qty 1

## 2018-02-13 MED ORDER — SERTRALINE HCL 50 MG PO TABS
50.0000 mg | ORAL_TABLET | Freq: Every day | ORAL | Status: DC
  Administered 2018-02-13 – 2018-02-14 (×2): 50 mg via ORAL
  Filled 2018-02-13 (×2): qty 1

## 2018-02-13 MED ORDER — TAMSULOSIN HCL 0.4 MG PO CAPS
0.4000 mg | ORAL_CAPSULE | Freq: Every day | ORAL | Status: DC
  Administered 2018-02-13 – 2018-02-14 (×2): 0.4 mg via ORAL
  Filled 2018-02-13 (×2): qty 1

## 2018-02-13 MED ORDER — DIVALPROEX SODIUM ER 250 MG PO TB24
250.0000 mg | ORAL_TABLET | Freq: Every day | ORAL | Status: DC
  Administered 2018-02-14: 250 mg via ORAL
  Filled 2018-02-13 (×2): qty 1

## 2018-02-13 MED ORDER — MIRTAZAPINE 15 MG PO TABS
15.0000 mg | ORAL_TABLET | Freq: Every day | ORAL | Status: DC
  Administered 2018-02-13: 15 mg via ORAL
  Filled 2018-02-13: qty 1

## 2018-02-13 MED ORDER — DOCUSATE SODIUM 100 MG PO CAPS
100.0000 mg | ORAL_CAPSULE | Freq: Two times a day (BID) | ORAL | Status: DC
  Administered 2018-02-13 – 2018-02-14 (×3): 100 mg via ORAL
  Filled 2018-02-13 (×3): qty 1

## 2018-02-13 MED ORDER — DIVALPROEX SODIUM ER 500 MG PO TB24
1000.0000 mg | ORAL_TABLET | Freq: Every evening | ORAL | Status: DC
  Administered 2018-02-13: 1000 mg via ORAL
  Filled 2018-02-13: qty 2

## 2018-02-13 MED ORDER — ATORVASTATIN CALCIUM 40 MG PO TABS
80.0000 mg | ORAL_TABLET | Freq: Every day | ORAL | Status: DC
  Administered 2018-02-13: 80 mg via ORAL
  Filled 2018-02-13: qty 2

## 2018-02-13 MED ORDER — GABAPENTIN 300 MG PO CAPS
900.0000 mg | ORAL_CAPSULE | Freq: Three times a day (TID) | ORAL | Status: DC
  Administered 2018-02-13 – 2018-02-14 (×2): 900 mg via ORAL
  Filled 2018-02-13 (×2): qty 3

## 2018-02-13 MED ORDER — POTASSIUM CHLORIDE CRYS ER 20 MEQ PO TBCR: 20.0000 meq | EXTENDED_RELEASE_TABLET | Freq: Every day | ORAL | Status: DC

## 2018-02-13 MED ORDER — PANTOPRAZOLE SODIUM 40 MG PO TBEC
40.0000 mg | DELAYED_RELEASE_TABLET | Freq: Every day | ORAL | Status: DC
  Administered 2018-02-13 – 2018-02-14 (×2): 40 mg via ORAL
  Filled 2018-02-13 (×2): qty 1

## 2018-02-13 MED ORDER — POTASSIUM CHLORIDE 20 MEQ PO PACK
40.0000 meq | PACK | Freq: Two times a day (BID) | ORAL | Status: DC
  Administered 2018-02-13 – 2018-02-14 (×3): 40 meq via ORAL
  Filled 2018-02-13 (×3): qty 2

## 2018-02-13 MED ORDER — RISPERIDONE 1 MG PO TABS
7.0000 mg | ORAL_TABLET | Freq: Every day | ORAL | Status: DC
  Administered 2018-02-13: 7 mg via ORAL
  Filled 2018-02-13: qty 7

## 2018-02-13 MED ORDER — RISPERIDONE 1 MG PO TABS: 2.0000 mg | ORAL_TABLET | Freq: Every day | ORAL | Status: DC

## 2018-02-13 NOTE — Discharge Instructions (Signed)
Be sure to take your CD with your recent CTs from [facility] to your appt with Dr. Morton Stall.   Small Bowel Obstruction A small bowel obstruction means that something is blocking the small bowel. The small bowel is also called the small intestine. It is the long tube that connects the stomach to the colon. An obstruction will stop food and fluids from passing through the small bowel. Treatment depends on what is causing the problem and how bad the problem is. Follow these instructions at home:  Get a lot of rest.  Follow your diet as told by your doctor. You may need to: ? Only drink clear liquids until you start to get better. ? Avoid solid foods as told by your doctor.  Take over-the-counter and prescription medicines only as told by your doctor.  Keep all follow-up visits as told by your doctor. This is important. Contact a doctor if:  You have a fever.  You have chills. Get help right away if:  You have pain or cramps that get worse.  You throw up (vomit) blood.  You have a feeling of being sick to your stomach (nausea) that does not go away.  You cannot stop throwing up.  You cannot drink fluids.  You feel confused.  You feel dry or thirsty (dehydrated).  Your belly gets more bloated.  You feel weak or you pass out (faint). This information is not intended to replace advice given to you by your health care provider. Make sure you discuss any questions you have with your health care provider. Document Released: 10/28/2004 Document Revised: 05/17/2016 Document Reviewed: 11/14/2014 Elsevier Interactive Patient Education  [employer].

## 2018-02-13 NOTE — Progress Notes (Addendum)
[facility]  Spoke with Dr. Cheryll Cockayne, MD at [facility]. Given that patient is doing better will plan for outpatient clinic Thursday Feb 16, 2018 @ 1PM in the [facility] (prior location where Mr. Kudrna has seen him).  If worsening, will notify Dr. Morton Stall for change in plan/ transfer.  For now adv diet as tolerated.   Notified Mr. Pozzi. Will get CD with both CT from 02/2018 and 3./2019 to send with him to take to Dr. Morton Stall.   Curlene Labrum, MD [facility] [address] [place], [place] [address] [phone] (office)

## 2018-02-13 NOTE — Progress Notes (Signed)
PROGRESS NOTE    ELAND LAMANTIA  SEG:[id number] DOB: August 06, 1966 DOA: 02/09/2018 PCP: [name], MD    Brief Narrative:  52 year old male with a history of Crohn's disease status post total colectomy with ileostomy 2009, admitted to the hospital with abdominal pain and found to have bowel obstruction.  General surgery following.  He has not had return of bowel function as of yet.   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 Small bowel obstruction.  General surgery following.  Patient is clinically improving.  No vomiting.  Noticing more output in ostomy.  Abdominal pain is better.  Tolerating clear liquids.  Advance diet as tolerated.  Plan is to follow-up with general surgery at [facility] as an outpatient. 2. Lactic acidosis.  Related to dehydration.  Improving with IV fluids. 3. Hyperlipidemia.  Restart on statin 4. Bipolar disorder. 5. Crohn's disease.  Restart on 6-MP.   DVT prophylaxis: heparin Code Status: Full code Family Communication: No family present Disposition Plan: Probable discharge home once tolerating solid diet with outpatient follow-up at [facility]   Consultants:   General surgery  Procedures:     Antimicrobials:       Subjective: Feeling better today.  Abdominal pain is better.  Passing some gas and stool colostomy.  Tolerating clear liquids  Objective: Vitals:   02/12/18 1356 02/12/18 2120 02/13/18 0522 02/13/18 1457  BP: (!) 97/58 125/72 114/71 122/62  Pulse: 64 66 65 (!) 54  Resp: 18 19 20 15   Temp: 98.2 F (36.8 C) 98.4 F (36.9 C) 98.2 F (36.8 C)   TempSrc: Oral Oral    SpO2: 96% 95% 98% 97%  Weight:      Height:        Intake/Output  Summary (Last 24 hours) at 02/13/2018 1904 Last data filed at 02/13/2018 0900 Gross per 24 hour  Intake 0 ml  Output -  Net 0 ml   Filed Weights   02/09/18 2251  Weight: 88.5 kg (195 lb)    Examination:  General exam: Alert, awake, oriented x 3 Respiratory system: Clear to auscultation. Respiratory effort normal. Cardiovascular system:RRR. No murmurs, rubs, gallops. Gastrointestinal system: Abdomen is nondistended, soft and nontender. No organomegaly or masses felt. Normal bowel sounds heard.  Colostomy with brown-colored stool and gas Central nervous system: Alert and oriented. No focal neurological deficits. Extremities: No C/C/E, +pedal pulses Skin: No rashes, lesions or ulcers Psychiatry: Judgement and insight appear normal. Mood & affect appropriate.   Data Reviewed: I have personally reviewed following labs and imaging studies  CBC: Recent Labs  Lab 02/09/18 2337 02/10/18 0751 02/11/18 0615 02/12/18 0610 02/13/18 0618  WBC 5.5 5.1 4.0 2.9* 2.9*  NEUTROABS 3.6 3.1 3.2 1.0* 1.2*  HGB 15.3 14.2 14.0 13.9 12.9*  HCT 44.0 41.9 41.5 42.5 38.3*  MCV 102.6* 103.5* 104.3* 105.5* 102.4*  PLT 128* 114* 116* 113* 737*   Basic Metabolic Panel: Recent Labs  Lab 02/09/18 2337 02/10/18 0751 02/11/18 0615 02/11/18 1144 02/12/18 0610 02/13/18 0618  NA 140 138 142  --  140 141  K 4.2 4.1 4.3  --  3.8 3.5  CL 102 103 108  --  106 108  CO2  24 27 26   --  27 28  GLUCOSE 124* 90 95  --  72 78  BUN 15 9 13   --  15 8  CREATININE 0.92 0.74 0.65  --  0.64 0.74  CALCIUM 9.3 8.6* 8.2*  --  8.1* 8.1*  MG 1.7  --   --  1.9 2.0 1.8  PHOS  --   --   --  3.4 1.6* 2.6   GFR: Estimated Creatinine Clearance: 112.6 mL/min (by C-G formula based on SCr of 0.74 mg/dL). Liver Function Tests: Recent Labs  Lab 02/09/18 2337  AST 27  ALT 25  ALKPHOS 51  BILITOT 1.1  PROT 6.6  ALBUMIN 3.5   Recent Labs  Lab 02/09/18 2337  LIPASE 27   No results for input(s): AMMONIA in the last 168  hours. Coagulation Profile: No results for input(s): INR, PROTIME in the last 168 hours. Cardiac Enzymes: No results for input(s): CKTOTAL, CKMB, CKMBINDEX, TROPONINI in the last 168 hours. BNP (last 3 results) No results for input(s): PROBNP in the last 8760 hours. HbA1C: No results for input(s): HGBA1C in the last 72 hours. CBG: Recent Labs  Lab 02/12/18 1621 02/12/18 2108 02/13/18 0738 02/13/18 1134 02/13/18 1650  GLUCAP 90 75 77 107* 92   Lipid Profile: No results for input(s): CHOL, HDL, LDLCALC, TRIG, CHOLHDL, LDLDIRECT in the last 72 hours. Thyroid Function Tests: No results for input(s): TSH, T4TOTAL, FREET4, T3FREE, THYROIDAB in the last 72 hours. Anemia Panel: No results for input(s): VITAMINB12, FOLATE, FERRITIN, TIBC, IRON, RETICCTPCT in the last 72 hours. Sepsis Labs: Recent Labs  Lab 02/09/18 2342 02/10/18 0312 02/10/18 0751  LATICACIDVEN 4.37* 3.0* 2.2*    No results found for this or any previous visit (from the past 240 hour(s)).       Radiology Studies: No results found.      Scheduled Meds: . docusate sodium  100 mg Oral BID  . heparin injection (subcutaneous)  5,000 Units Subcutaneous Q8H  . insulin aspart  0-15 Units Subcutaneous TID WC  . phosphorus  500 mg Oral BID  . potassium chloride  40 mEq Oral BID   Continuous Infusions: . sodium chloride 50 mL/hr at 02/12/18 1636  . famotidine (PEPCID) IV Stopped (02/13/18 1125)     LOS: 3 days    Time spent: 62mns    JKathie Dike MD [facility] Pager [phone] If 7PM-7AM, please contact night-coverage [email or web address] Password [email or web address]5/13/2019, 7:04 PM

## 2018-02-13 NOTE — Progress Notes (Signed)
[facility] Progress Note     Subjective: No nausea/vomiting. Reports more gas output from the ostomy. Feeling less pain.   Objective: Vital signs in last 24 hours: Temp:  [98.2 F (36.8 C)-98.4 F (36.9 C)] 98.2 F (36.8 C) (05/13 0522) Pulse Rate:  [65-66] 65 (05/13 0522) Resp:  [19-20] 20 (05/13 0522) BP: (114-125)/(71-72) 114/71 (05/13 0522) SpO2:  [95 %-98 %] 98 % (05/13 0522) Last BM Date: (colostomy bag)  Intake/Output from previous day: 05/12 0701 - 05/13 0700 In: 2053.8 [P.O.:360; I.V.:1643.8; IV Piggyback:50] Out: 3151 [Urine:1550; Stool:200] Intake/Output this shift: No intake/output data recorded.  General appearance: alert, cooperative and no distress Resp: normal work breathing GI: soft, nondistended, tenderness improved in the RLQ, ostomy bag with air and some liquid stool  Lab Results:  Recent Labs    02/12/18 0610 02/13/18 0618  WBC 2.9* 2.9*  HGB 13.9 12.9*  HCT 42.5 38.3*  PLT 113* 125*   BMET Recent Labs    02/12/18 0610 02/13/18 0618  NA 140 141  K 3.8 3.5  CL 106 108  CO2 27 28  GLUCOSE 72 78  BUN 15 8  CREATININE 0.64 0.74  CALCIUM 8.1* 8.1*    Assessment/Plan: [name] is a 52 yo with h/o Crohn's and parastomal hernia with pSBO related to the parastomal hernia.   -Clear liquids today as pain improved and more output/ gas  -Lytes continue to replace -Up speak with Dr. Morton Stall today regarding patient and plan      LOS: 3 days    Virl Cagey 02/13/2018

## 2018-02-14 DIAGNOSIS — F319 Bipolar disorder, unspecified: Secondary | ICD-10-CM

## 2018-02-14 LAB — BASIC METABOLIC PANEL
ANION GAP: 8 (ref 5–15)
BUN: 6 mg/dL (ref 6–20)
CALCIUM: 8.9 mg/dL (ref 8.9–10.3)
CO2: 26 mmol/L (ref 22–32)
Chloride: 109 mmol/L (ref 101–111)
Creatinine, Ser: 0.76 mg/dL (ref 0.61–1.24)
GFR calc non Af Amer: >60 mL/min (ref >60)
Glucose, Bld: 76 mg/dL (ref 65–99)
POTASSIUM: 3.7 mmol/L (ref 3.5–5.1)
Sodium: 143 mmol/L (ref 135–145)

## 2018-02-14 LAB — CBC WITH DIFFERENTIAL/PLATELET
BASOS ABS: 0 10*3/uL (ref 0.0–0.1)
BASOS PCT: 0 %
Eosinophils Absolute: 0.2 10*3/uL (ref 0.0–0.7)
Eosinophils Relative: 6 %
HCT: 41.4 % (ref 39.0–52.0)
HEMOGLOBIN: 14.3 g/dL (ref 13.0–17.0)
Lymphocytes Relative: 43 %
Lymphs Abs: 1.8 10*3/uL (ref 0.7–4.0)
MCH: 35 pg — ABNORMAL HIGH (ref 26.0–34.0)
MCHC: 34.5 g/dL (ref 30.0–36.0)
MCV: 101.2 fL — AB (ref 78.0–100.0)
Monocytes Absolute: 0.4 10*3/uL (ref 0.1–1.0)
Monocytes Relative: 11 %
NEUTROS ABS: 1.7 10*3/uL (ref 1.7–7.7)
Neutrophils Relative %: 40 %
Platelets: 159 10*3/uL (ref 150–400)
RBC: 4.09 MIL/uL — AB (ref 4.22–5.81)
RDW: 14.4 % (ref 11.5–15.5)
WBC: 4.1 10*3/uL (ref 4.0–10.5)

## 2018-02-14 LAB — GLUCOSE, CAPILLARY
GLUCOSE-CAPILLARY: 109 mg/dL — AB (ref 65–99)
Glucose-Capillary: 93 mg/dL (ref 65–99)

## 2018-02-14 MED ORDER — DOCUSATE SODIUM 100 MG PO CAPS: 100.0000 mg | ORAL_CAPSULE | Freq: Two times a day (BID) | ORAL | 0 refills | Status: DC

## 2018-02-14 NOTE — Progress Notes (Signed)
Discharge instructions reviewed with patient.  Radiology discs given to patient and follow up appt. Reviewed.  Patient verbalized understanding of instructions.

## 2018-02-14 NOTE — Progress Notes (Signed)
[facility] Progress Note     Subjective: Doing well. No complaints. Tolerated soft diet. Ostomy with significant gas in bag and liquid.   Objective: Vital signs in last 24 hours: Temp:  [98 F (36.7 C)-98.6 F (37 C)] 98.6 F (37 C) (05/14 0612) Pulse Rate:  [54-60] 54 (05/14 0612) Resp:  [15-18] 18 (05/14 0612) BP: (117-131)/(62-70) 117/65 (05/14 0612) SpO2:  [97 %-98 %] 98 % (05/14 0612) Last BM Date: (colostomy bag)  Intake/Output from previous day: 05/13 0701 - 05/14 0700 In: 0  Out: 200 [Stool:200] Intake/Output this shift: Total I/O In: -  Out: 600 [Stool:600]  General appearance: alert, cooperative and no distress Resp: normal work breathing GI: soft, non-tender; bowel sounds normal; no masses,  no organomegaly and ostomy bag with liquid and gas  Lab Results:  Recent Labs    02/13/18 0618 02/14/18 0458  WBC 2.9* 4.1  HGB 12.9* 14.3  HCT 38.3* 41.4  PLT 125* 159   BMET Recent Labs    02/13/18 0618 02/14/18 0458  NA 141 143  K 3.5 3.7  CL 108 109  CO2 28 26  GLUCOSE 78 76  BUN 8 6  CREATININE 0.74 0.76  CALCIUM 8.1* 8.9    Assessment/Plan: [name] is a 52 yo with h/o Crohn's and parastomal hernia with pSBO related to the parastomal hernia.  Resolving.  -Follow up with Dr. Morton Stall Thursday 5/16  @ 1pm at [place] with recent CTs sent with patient  -Can d/c home today    LOS: 4 days    Virl Cagey 02/14/2018

## 2018-02-14 NOTE — Discharge Summary (Signed)
Physician Discharge Summary  [name] QQP:[id number] DOB: 17-Oct-1965 DOA: 02/09/2018  PCP: [name] Schwalbe, Lane  Admit date: 02/09/2018 Discharge date: 02/14/2018  Admitted From: Home Disposition: Home  Recommendations for Outpatient Follow-up:  1. Follow up with PCP in 1-2 weeks 2. Please obtain BMP/CBC in one week 3. Patient will follow-up [facility] on 5/16 with general surgery.   Home Health: Equipment/Devices:  Discharge Condition: Stable CODE STATUS: Full code Diet recommendation: Heart Healthy   Brief/Interim Summary: 52 year old male with a history of Crohn's disease status post total colectomy with ileostomy 2009, admitted to the hospital with abdominal pain and found to have bowel obstruction.  General surgery following.  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 Small bowel obstruction.    Patient was treated conservatively for small bowel obstruction.  NG tube was initially placed but subsequently removed when vomiting stopped.  Patient's diet was slowly advanced and he began having output through ostomy.  He did not have any further vomiting.  Due to his parastomal hernia, his primary surgeons at [facility] were contacted by [name].  It was felt that since patient is improving, he can follow-up with them as an outpatient.  The patient is tolerating a solid diet at this point.  Is not having vomiting and having significant output through ostomy.  Belly feels benign.  He feels ready for discharge home. 2. Lactic acidosis.  Related to dehydration.    Resolved with IV fluids. 3. Hyperlipidemia.    Continue statin 4. Bipolar disorder.  Continue on home  dose of Depakote, Cogentin and other psychotropics.  5. Crohn's disease.    Continue on 6-MP.  No evidence of flare at this time.   Discharge Instructions  Discharge Instructions    Diet - low sodium heart healthy   Complete by:  As directed    Increase activity slowly   Complete by:  As directed      Allergies as of 02/14/2018      Reactions   Aspirin    REACTION: unknown reaction   Bactrim [sulfamethoxazole-trimethoprim]    ABD CRAMPS AND DIARRHEA   Codeine    REACTION: unknown reaction   Ibuprofen    REACTION: unknown reaction   Penicillins    REACTION: unknown reaction   Remicade [infliximab]    COULDN'T BREATHE   Tramadol Hcl    REACTION: unknown reaction   Vicodin [hydrocodone-acetaminophen] Nausea Only      Medication List    TAKE these medications   acetaminophen 500 MG tablet Commonly known as:  TYLENOL Take 1,000 mg by mouth every 6 (six) hours as needed. For pain   atorvastatin 80 MG tablet Commonly known as:  LIPITOR Take 80 mg by mouth daily.   benztropine 0.5 MG tablet Commonly known as:  COGENTIN Take 0.5 mg by mouth daily.   Calcium Carbonate-Vitamin D 600-400 MG-UNIT tablet Commonly known as:  CALCIUM 600+D 1 PO TID AFTER A MEAL What changed:    how much to take  how to take this  when to take this  additional instructions   dicyclomine 10 MG capsule Commonly known as:  BENTYL TAKE 1 OR 2 CAPSULE BY MOUTH AS  DIRECTED BEFORE MEALS AND AT BEDTIME.   divalproex 500 MG 24 hr tablet Commonly known as:  DEPAKOTE ER Take 1,000 mg by mouth every evening.   divalproex 250 MG 24 hr tablet Commonly known as:  DEPAKOTE ER Take 250 mg by mouth daily.   docusate sodium 100 MG capsule Commonly known as:  COLACE Take 1 capsule (100 mg total) by mouth 2 (two) times daily.   folic acid 1 MG tablet Commonly known as:  FOLVITE Take 1 mg by mouth daily.   furosemide 40 MG tablet Commonly known as:  LASIX Take 40 mg by mouth daily.    gabapentin 300 MG capsule Commonly known as:  NEURONTIN Take 900 mg by mouth 3 (three) times daily.   lidocaine 2 % solution Commonly known as:  XYLOCAINE 2 TSP  PO Q4-6H PRN FOR HEARTBURN OR UPPER ABDOMINAL PAIN   metFORMIN 500 MG tablet Commonly known as:  GLUCOPHAGE Take by mouth 2 (two) times daily with a meal.   mirtazapine 15 MG tablet Commonly known as:  REMERON Take 15 mg by mouth at bedtime.   pantoprazole 40 MG tablet Commonly known as:  PROTONIX TAKE (1) TABLET BY MOUTH TWICE A DAY BEFORE MEALS. (BREAKFAST AND SUPPER)   potassium chloride SA 20 MEQ tablet Commonly known as:  K-DUR,KLOR-CON Take 20 mEq by mouth daily.   albuterol (2.5 MG/3ML) 0.083% nebulizer solution Commonly known as:  PROVENTIL Take 2.5 mg by nebulization every 6 (six) hours as needed for wheezing or shortness of breath.   PROAIR HFA 108 (90 Base) MCG/ACT inhaler Generic drug:  albuterol Inhale 2 puffs into the lungs every 6 (six) hours as needed for wheezing or shortness of breath.   risperiDONE 2 MG tablet Commonly known as:  RISPERDAL Take 2 mg by mouth at bedtime. 3.5 tabs qhs   sertraline 50 MG tablet Commonly known as:  ZOLOFT Take 50 mg by mouth daily.   tamsulosin 0.4 MG Caps capsule Commonly known as:  FLOMAX Take 0.4 mg by mouth daily.      Follow-up Information    [name] Follow up on 02/16/2018.   Specialty:  Surgical Oncology Why:  Thursday May 16/2019 @ 1:00 PM in the normal clinic you have seen [name] for prior appts; BE SURE TO TAKE YOUR CD WITH YOUR RECENT CTS TO THE APPT Contact information: [address] [place]  [address] [phone]          Allergies  Allergen Reactions  . Aspirin     REACTION: unknown reaction  . Bactrim [Sulfamethoxazole-Trimethoprim]     ABD CRAMPS AND DIARRHEA  . Codeine     REACTION: unknown reaction  . Ibuprofen     REACTION: unknown reaction  . Penicillins     REACTION:  unknown reaction  . Remicade [Infliximab]     COULDN'T BREATHE  . Tramadol Hcl     REACTION: unknown reaction  . Vicodin [Hydrocodone-Acetaminophen] Nausea Only    Consultations:  General surgery   Procedures/Studies: Ct Abdomen Pelvis W Contrast  Result Date: 02/10/2018 CLINICAL DATA:  52 year old male with abdominal pain. EXAM: CT ABDOMEN AND PELVIS WITH CONTRAST TECHNIQUE: Multidetector CT imaging of the abdomen and pelvis was performed using the standard protocol following bolus administration of intravenous contrast. CONTRAST:  188m ISOVUE-300 IOPAMIDOL (ISOVUE-300) INJECTION 61% COMPARISON:  Abdominal CT dated 12/29/2017 FINDINGS: Lower chest: The visualized lung bases are clear. No intra-abdominal free air or free fluid. Hepatobiliary: No focal  liver abnormality is seen. No gallstones, gallbladder wall thickening, or biliary dilatation. Pancreas: Unremarkable. No pancreatic ductal dilatation or surrounding inflammatory changes. Spleen: Normal in size without focal abnormality. Adrenals/Urinary Tract: The adrenal glands are unremarkable. Left renal cortical scarring and parenchymal irregularity. The right kidney is unremarkable. There is symmetric enhancement and excretion of contrast by both kidneys. The urinary bladder is distended and grossly unremarkable. Stomach/Bowel: Postsurgical changes of colectomy with a right lower quadrant ileostomy. There is herniation of multiple loops of small bowel into the ostomy. There is fecalization of the parastomal loops and dilatation of loops of small bowel proximal to the ileostomy measuring up to 3.5 cm in caliber. Overall there has been slight interval increase in the size of the bowel loops compared to the prior CT and interval increase in the surrounding edema. Findings most consistent with a degree obstruction within the hernia. There is inflammatory changes and small amount of fluid surrounding the herniated loops of small bowel and in the  adjacent subcutaneous fat. Vascular/Lymphatic: Mild aortoiliac atherosclerotic disease. No portal venous gas. There is no adenopathy. Reproductive: The prostate and seminal vesicles are grossly unremarkable. No pelvic mass. Other: Midline vertical anterior abdominal wall incisional scar. Musculoskeletal: Right hip arthroplasty. No acute osseous pathology. IMPRESSION: Findings most consistent with a degree of small-bowel obstruction within the parastomal hernia. Overall there has been interval increase in the caliber of the distal small bowel as well as increased associated inflammatory changes of these loops of bowel compared to the prior CT. Electronically Signed   By: Anner Crete M.D.   On: 02/10/2018 02:17   Dg Chest Portable 1 View  Result Date: 02/10/2018 CLINICAL DATA:  Verify gastric tube placement EXAM: PORTABLE CHEST 1 VIEW COMPARISON:  12/29/2017 FINDINGS: The heart size and mediastinal contours are within normal limits. Aortic atherosclerosis at the arch without aneurysm. A gastric tube is seen extending below the left hemidiaphragm and is coiled in the expected location of the stomach. Both lungs are clear. Port catheter tip terminates in the mid SVC. Partially included shoulder arthroplasties are noted without complicating features. IMPRESSION: Gastric tube extends into the expected location of the stomach. Clear lungs. Electronically Signed   By: Ashley Royalty M.D.   On: 02/10/2018 03:30       Subjective: Feeling better.  No vomiting.  Passing stool and flatus through colostomy.  Tolerating solid diet.  Discharge Exam: Vitals:   02/13/18 2236 02/14/18 0612  BP: 131/70 117/65  Pulse: 60 (!) 54  Resp: 16 18  Temp: 98 F (36.7 C) 98.6 F (37 C)  SpO2: 98% 98%   Vitals:   02/13/18 0522 02/13/18 1457 02/13/18 2236 02/14/18 0612  BP: 114/71 122/62 131/70 117/65  Pulse: 65 (!) 54 60 (!) 54  Resp: 20 15 16 18   Temp: 98.2 F (36.8 C)  98 F (36.7 C) 98.6 F (37 C)  TempSrc:    Oral Oral  SpO2: 98% 97% 98% 98%  Weight:      Height:        General: Pt is alert, awake, not in acute distress Cardiovascular: RRR, S1/S2 +, no rubs, no gallops Respiratory: CTA bilaterally, no wheezing, no rhonchi Abdominal: Soft, NT, ND, bowel sounds +, colostomy with brown-colored stool present. Extremities: no edema, no cyanosis    The results of significant diagnostics from this hospitalization (including imaging, microbiology, ancillary and laboratory) are listed below for reference.     Microbiology: No results found for this or any previous visit (from  the past 240 hour(s)).   Labs: BNP (last 3 results) No results for input(s): BNP in the last 8760 hours. Basic Metabolic Panel: Recent Labs  Lab 02/09/18 2337 02/10/18 0751 02/11/18 0615 02/11/18 1144 02/12/18 0610 02/13/18 0618 02/14/18 0458  NA 140 138 142  --  140 141 143  K 4.2 4.1 4.3  --  3.8 3.5 3.7  CL 102 103 108  --  106 108 109  CO2 24 27 26   --  27 28 26   GLUCOSE 124* 90 95  --  72 78 76  BUN 15 9 13   --  15 8 6   CREATININE 0.92 0.74 0.65  --  0.64 0.74 0.76  CALCIUM 9.3 8.6* 8.2*  --  8.1* 8.1* 8.9  MG 1.7  --   --  1.9 2.0 1.8  --   PHOS  --   --   --  3.4 1.6* 2.6  --    Liver Function Tests: Recent Labs  Lab 02/09/18 2337  AST 27  ALT 25  ALKPHOS 51  BILITOT 1.1  PROT 6.6  ALBUMIN 3.5   Recent Labs  Lab 02/09/18 2337  LIPASE 27   No results for input(s): AMMONIA in the last 168 hours. CBC: Recent Labs  Lab 02/10/18 0751 02/11/18 0615 02/12/18 0610 02/13/18 0618 02/14/18 0458  WBC 5.1 4.0 2.9* 2.9* 4.1  NEUTROABS 3.1 3.2 1.0* 1.2* 1.7  HGB 14.2 14.0 13.9 12.9* 14.3  HCT 41.9 41.5 42.5 38.3* 41.4  MCV 103.5* 104.3* 105.5* 102.4* 101.2*  PLT 114* 116* 113* 125* 159   Cardiac Enzymes: No results for input(s): CKTOTAL, CKMB, CKMBINDEX, TROPONINI in the last 168 hours. BNP: Invalid input(s): POCBNP CBG: Recent Labs  Lab 02/13/18 0738 02/13/18 1134 02/13/18 1650  02/13/18 2235 02/14/18 0746  GLUCAP 77 107* 92 82 93   D-Dimer No results for input(s): DDIMER in the last 72 hours. Hgb A1c No results for input(s): HGBA1C in the last 72 hours. Lipid Profile No results for input(s): CHOL, HDL, LDLCALC, TRIG, CHOLHDL, LDLDIRECT in the last 72 hours. Thyroid function studies No results for input(s): TSH, T4TOTAL, T3FREE, THYROIDAB in the last 72 hours.  Invalid input(s): FREET3 Anemia work up No results for input(s): VITAMINB12, FOLATE, FERRITIN, TIBC, IRON, RETICCTPCT in the last 72 hours. Urinalysis    Component Value Date/Time   COLORURINE YELLOW 12/29/2017 1140   APPEARANCEUR HAZY (A) 12/29/2017 1140   LABSPEC 1.017 12/29/2017 1140   PHURINE 5.0 12/29/2017 1140   GLUCOSEU NEGATIVE 12/29/2017 1140   HGBUR SMALL (A) 12/29/2017 1140   BILIRUBINUR NEGATIVE 12/29/2017 1140   KETONESUR NEGATIVE 12/29/2017 1140   PROTEINUR NEGATIVE 12/29/2017 1140   UROBILINOGEN 0.2 10/17/2014 1940   NITRITE POSITIVE (A) 12/29/2017 1140   LEUKOCYTESUR LARGE (A) 12/29/2017 1140   Sepsis Labs Invalid input(s): PROCALCITONIN,  WBC,  LACTICIDVEN Microbiology No results found for this or any previous visit (from the past 240 hour(s)).   Time coordinating discharge: 58mns  SIGNED:   JKathie Dike Lane  [facility] 02/14/2018, 11:20 AM Pager   If 7PM-7AM, please contact night-coverage [email or web address] Password [email or web address]

## 2018-03-07 ENCOUNTER — Telehealth: Payer: Self-pay

## 2018-03-07 NOTE — Telephone Encounter (Signed)
REVIEWED-NO ADDITIONAL RECOMMENDATIONS. 

## 2018-03-07 NOTE — Telephone Encounter (Signed)
PT left Vm to let Dr. Oneida Alar know that he has been scheduled for surgery for his hernia on 04/09/2018.

## 2018-06-09 ENCOUNTER — Other Ambulatory Visit: Payer: Self-pay | Admitting: Gastroenterology

## 2018-07-10 ENCOUNTER — Other Ambulatory Visit: Payer: Self-pay | Admitting: *Deleted

## 2018-07-10 ENCOUNTER — Encounter: Payer: Self-pay | Admitting: *Deleted

## 2018-07-10 DIAGNOSIS — K50919 Crohn's disease, unspecified, with unspecified complications: Secondary | ICD-10-CM

## 2018-07-13 ENCOUNTER — Encounter: Payer: Self-pay | Admitting: Gastroenterology

## 2018-08-08 ENCOUNTER — Ambulatory Visit: Payer: Medicaid Other | Admitting: Urology

## 2018-09-07 ENCOUNTER — Encounter: Payer: Self-pay | Admitting: Gastroenterology

## 2018-09-29 ENCOUNTER — Other Ambulatory Visit: Payer: Self-pay | Admitting: Nurse Practitioner

## 2018-10-10 LAB — COMPREHENSIVE METABOLIC PANEL
AG Ratio: 1.8 (calc) (ref 1.0–2.5)
ALKALINE PHOSPHATASE (APISO): 56 U/L (ref 40–115)
ALT: 18 U/L (ref 9–46)
AST: 18 U/L (ref 10–35)
Albumin: 4.3 g/dL (ref 3.6–5.1)
BUN: 11 mg/dL (ref 7–25)
CO2: 30 mmol/L (ref 20–32)
CREATININE: 0.89 mg/dL (ref 0.70–1.33)
Calcium: 9.8 mg/dL (ref 8.6–10.3)
Chloride: 103 mmol/L (ref 98–110)
GLUCOSE: 85 mg/dL (ref 65–99)
Globulin: 2.4 g/dL (calc) (ref 1.9–3.7)
Potassium: 4.8 mmol/L (ref 3.5–5.3)
SODIUM: 140 mmol/L (ref 135–146)
TOTAL PROTEIN: 6.7 g/dL (ref 6.1–8.1)
Total Bilirubin: 0.9 mg/dL (ref 0.2–1.2)

## 2018-10-10 LAB — CBC WITH DIFFERENTIAL/PLATELET
Absolute Monocytes: 349 cells/uL (ref 200–950)
BASOS PCT: 0.6 %
Basophils Absolute: 22 cells/uL (ref 0–200)
EOS ABS: 68 {cells}/uL (ref 15–500)
Eosinophils Relative: 1.9 %
HCT: 41.4 % (ref 38.5–50.0)
Hemoglobin: 14.5 g/dL (ref 13.2–17.1)
Lymphs Abs: 1192 cells/uL (ref 850–3900)
MCH: 35.9 pg — AB (ref 27.0–33.0)
MCHC: 35 g/dL (ref 32.0–36.0)
MCV: 102.5 fL — AB (ref 80.0–100.0)
MPV: 11 fL (ref 7.5–12.5)
Monocytes Relative: 9.7 %
NEUTROS PCT: 54.7 %
Neutro Abs: 1969 cells/uL (ref 1500–7800)
Platelets: 156 10*3/uL (ref 140–400)
RBC: 4.04 10*6/uL — AB (ref 4.20–5.80)
RDW: 13.4 % (ref 11.0–15.0)
TOTAL LYMPHOCYTE: 33.1 %
WBC: 3.6 10*3/uL — AB (ref 3.8–10.8)

## 2018-10-12 ENCOUNTER — Ambulatory Visit: Payer: Medicaid Other | Admitting: Gastroenterology

## 2018-10-13 NOTE — Progress Notes (Signed)
Left results on Vm and to call if questions.

## 2018-11-01 ENCOUNTER — Other Ambulatory Visit: Payer: Self-pay | Admitting: Gastroenterology

## 2018-11-14 ENCOUNTER — Ambulatory Visit: Payer: Medicaid Other | Admitting: Urology

## 2018-11-14 DIAGNOSIS — R3914 Feeling of incomplete bladder emptying: Secondary | ICD-10-CM

## 2018-11-30 ENCOUNTER — Ambulatory Visit (INDEPENDENT_AMBULATORY_CARE_PROVIDER_SITE_OTHER): Payer: Medicaid Other | Admitting: Gastroenterology

## 2018-11-30 ENCOUNTER — Encounter: Payer: Self-pay | Admitting: Gastroenterology

## 2018-11-30 DIAGNOSIS — K50018 Crohn's disease of small intestine with other complication: Secondary | ICD-10-CM | POA: Diagnosis not present

## 2018-11-30 NOTE — Assessment & Plan Note (Signed)
SYMPTOMS FAIRLY WELL CONTROLLED.  CONTINUE TO MONITOR SYMPTOMS. PLEASE CALL WITH QUESTIONS OR CONCERNS. DRINK WATER TO KEEP YOUR URINE LIGHT YELLOW.  FOLLOW UP IN 6 MOS.

## 2018-11-30 NOTE — Progress Notes (Signed)
Subjective:    Patient ID: [name], male    DOB: 09-11-1966, 53 y.o.   MRN: [id number]  [name], MD   HPI After surgery weighed 175 lbs and was up to 199 lbs and now down to 187 lbs. Had a cold since sun. Watery stools started every am since surgery. NEVER HAS NORMAL FORMED STOOL. MILK: NO, ICE CREAM: CHEESE: NO, NO PROCESSED MEALS. EMPTYING BAG: 8 TIMES USUALLY, MAY GET UP AT NIGHT TO EMPTY: 1X/WEEK. TEMP UP TO 99 F. DOWN TO 2 CIGS A DAY. BEEN TRYING TO QUIT SINCE NOV. BELLY PAIN: @ [facility](ALL THE TIME, MILD, SHARP: 1-2X/DAY, LASTS 30 MIN-40 MINS, NO RADIATION). HEARTBURN ONCE A WEEK. NO UTI IN 7 MOS SINCE HE QUIT DRINKING SODA.Marland Kitchen   PT DENIES FEVER, CHILLS, HEMATOCHEZIA, HEMATEMESIS, nausea, vomiting, melena, CHEST PAIN, SHORTNESS OF BREATH,  CHANGE IN BOWEL IN HABITS, constipation, abdominal pain, problems swallowing, OR heartburn or indigestion.  Past Medical History:  Diagnosis Date  . Arthritis   . Asthma   . 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 Bipolar disorder ([place])   . Colostomy in place [facility])   . Crohn's colitis ([place])    s/p total colectomy with ileostomy in 2009  . Crohn's disease of small intestine [facility]) Sept 2012   ileoscopy: multiple ulcers likely secondary to Crohn's  . DEEP VENOUS THROMBOPHLEBITIS, HX OF 02/11/2009   Qualifier: Diagnosis of  By: Zeb Comfort    . Depression   . DVT (deep venous thrombosis) ([place]) 2009   right upper extremity due to PICC  . Enterococcus UTI 2009  . GERD (gastroesophageal reflux disease)   . Hernia   . Hyperlipidemia   . Insomnia   . Migraine headache   . Nystagmus   . PULMONARY EMBOLISM, HX OF 02/11/2009   Qualifier: Diagnosis of  By: Zeb Comfort    . S/P endoscopy Sept 2012   mild gastritis, small hiatal hernia, no ulcers  . Schizophrenia ([place])   . Schizophrenia, acute [facility])    Past Surgical History:  Procedure Laterality Date  . APPENDECTOMY    .  ESOPHAGOGASTRODUODENOSCOPY  07/2010   gastritis,  bx neg for H.Pylori  . ESOPHAGOGASTRODUODENOSCOPY N/A 01/08/2014   SLF:NO SOURCE FOR ODYNOPHAGIA IDENTIFIED/Multiple small ulcers in the gastric antrum  . EYE SURGERY    . ILEOSCOPY  06/29/2011   SLF: ulcers,multiple/small HH/mild gastritis  . KIDNEY SURGERY    . LAPAROTOMY N/A 06/05/2013   Procedure: EXPLORATORY LAPAROTOMY;  Surgeon: Donato Heinz, MD;  Location: [facility];  Service: General;  Laterality: N/A;  . LYSIS OF ADHESION N/A 06/05/2013   Procedure: LYSIS OF ADHESIONS;  Surgeon: Donato Heinz, MD;  Location: [facility];  Service: General;  Laterality: N/A;  . small bowel capsule study  08/2010   few tiny nonbleeding erosions/ulcers ?secondary to relafen or Crohn's. Entire small bowel not seen.   Marland Kitchen TOTAL COLECTOMY  2009   with ileostomy at [facility] for refractory disease   . TOTAL HIP ARTHROPLASTY     right, due to avascular necrosis from chronic prednisone  . TOTAL SHOULDER REPLACEMENT     bilateral   Allergies  Allergen Reactions  . Aspirin     REACTION: unknown reaction  . Bactrim [Sulfamethoxazole-Trimethoprim]     ABD CRAMPS AND DIARRHEA  . Codeine     REACTION: unknown reaction  . Ibuprofen     REACTION: unknown reaction  .  Penicillins     REACTION: unknown reaction  . Remicade [Infliximab]     COULDN'T BREATHE  . Tramadol Hcl     REACTION: unknown reaction  . Vicodin [Hydrocodone-Acetaminophen] Nausea Only   Current Outpatient Medications  Medication Sig    . acetaminophen (TYLENOL) 500 MG tablet Take 1,000 mg by mouth every 6 (six) hours as needed. For pain     . albuterol (PROAIR HFA) 108 (90 BASE) MCG/ACT inhaler Inhale 2 puffs into the lungs every 6 (six) hours as needed for wheezing or shortness of breath.    Marland Kitchen albuterol (PROVENTIL) (2.5 MG/3ML) 0.083% nebulizer solution Take 2.5 mg by nebulization every 6 (six) hours as needed for wheezing or shortness of breath.    Marland Kitchen atorvastatin (LIPITOR) 80 MG tablet Take 80  mg by mouth daily.    . benztropine (COGENTIN) 0.5 MG tablet Take 0.5 mg by mouth daily.     Marland Kitchen CALCIUM 600/VITAMIN D3 600-800 MG-UNIT TABS TAKE (1) TABLET BY MOUTH THREE TIMES DAILY AFTER MEALS.    Marland Kitchen dicyclomine (BENTYL) 10 MG capsule TAKE 1 OR 2 CAPSULE BY MOUTH AS DIRECTED BEFORE MEALS AND AT BEDTIME.    . divalproex (DEPAKOTE ER) 250 MG 24 hr tablet Take 250 mg by mouth daily.     . divalproex (DEPAKOTE) 500 MG 24 hr tablet Take 1,000 mg by mouth every evening.     .      . folic acid (FOLVITE) 1 MG tablet Take 1 mg by mouth daily.      . furosemide (LASIX) 40 MG tablet Take 40 mg by mouth daily.      Marland Kitchen gabapentin (NEURONTIN) 300 MG capsule Take 900 mg by mouth 3 (three) times daily.     Marland Kitchen lidocaine (XYLOCAINE) 2 % solution 2 TSP  PO Q4-6H PRN FOR HEARTBURN OR UPPER ABDOMINAL PAIN    . mirtazapine (REMERON) 15 MG tablet Take 15 mg by mouth at bedtime.    . pantoprazole (PROTONIX) 40 MG tablet TAKE (1) TABLET BY MOUTH TWICE A DAY BEFORE MEALS. (BREAKFAST AND SUPPER)    . potassium chloride SA (K-DUR,KLOR-CON) 20 MEQ tablet Take 20 mEq by mouth daily.     . risperiDONE (RISPERDAL) 2 MG tablet Take 2 mg by mouth at bedtime. 3.5 tabs qhs    . sertraline (ZOLOFT) 50 MG tablet Take 50 mg by mouth daily.    . tamsulosin (FLOMAX) 0.4 MG CAPS capsule Take 0.4 mg by mouth daily.    . metFORMIN (GLUCOPHAGE) 500 MG tablet Take by mouth 2 (two) times daily with a meal.     Review of Systems PER HPI OTHERWISE ALL SYSTEMS ARE NEGATIVE.    Objective:   Physical Exam Vitals signs reviewed.  Constitutional:      General: He is not in acute distress.    Appearance: He is well-developed.  HENT:     Head: Normocephalic and atraumatic.     Mouth/Throat:     Pharynx: No oropharyngeal exudate.  Eyes:     General: No scleral icterus.    Pupils: Pupils are equal, round, and reactive to light.  Neck:     Musculoskeletal: Normal range of motion and neck supple.  Cardiovascular:     Rate and Rhythm:  Normal rate and regular rhythm.     Heart sounds: Normal heart sounds.  Pulmonary:     Effort: Pulmonary effort is normal. No respiratory distress.     Breath sounds: Normal breath sounds.  Abdominal:  General: Bowel sounds are normal. There is no distension.     Palpations: Abdomen is soft.     Tenderness: There is no abdominal tenderness.     Comments: Ostomy in [place]  Musculoskeletal:     Right lower leg: No edema.     Left lower leg: No edema.  Lymphadenopathy:     Cervical: No cervical adenopathy.  Skin:    General: Skin is warm and dry.  Neurological:     Mental Status: He is alert and oriented to person, place, and time. Mental status is at baseline.     Comments: NO  NEW FOCAL DEFICITS       Assessment & Plan:

## 2018-11-30 NOTE — Patient Instructions (Signed)
PLEASE CALL WITH QUESTIONS OR CONCERNS.  DRINK WATER TO KEEP YOUR URINE LIGHT YELLOW.  FOLLOW UP IN 6 MOS.

## 2018-12-01 NOTE — Progress Notes (Signed)
ON RECALL  °

## 2018-12-04 NOTE — Progress Notes (Signed)
cc'd to pcp 

## 2018-12-22 ENCOUNTER — Other Ambulatory Visit: Payer: Self-pay | Admitting: Gastroenterology

## 2019-04-24 ENCOUNTER — Other Ambulatory Visit (HOSPITAL_COMMUNITY)
Admission: RE | Admit: 2019-04-24 | Discharge: 2019-04-24 | Disposition: A | Discharge status: Home / Self Care | Payer: Medicaid Other | Source: Ambulatory Visit | Attending: Urology | Admitting: Urology

## 2019-04-24 ENCOUNTER — Encounter: Payer: Self-pay | Admitting: Gastroenterology

## 2019-04-24 ENCOUNTER — Ambulatory Visit: Payer: Medicaid Other | Admitting: Urology

## 2019-04-24 DIAGNOSIS — R3914 Feeling of incomplete bladder emptying: Secondary | ICD-10-CM

## 2019-04-24 DIAGNOSIS — N3000 Acute cystitis without hematuria: Secondary | ICD-10-CM | POA: Insufficient documentation

## 2019-04-25 LAB — URINE CULTURE: Culture: <10000 — AB

## 2019-05-02 ENCOUNTER — Telehealth: Payer: Self-pay

## 2019-05-02 NOTE — Telephone Encounter (Signed)
Received a fax request for Underpads and ostomy supplies from [facility].   Placed on Dr. Oneida Alar chair/desk to sign so I can fax back.

## 2019-05-03 ENCOUNTER — Ambulatory Visit: Payer: Medicaid Other | Admitting: Gastroenterology

## 2019-05-03 NOTE — Telephone Encounter (Signed)
REVIEWED-NO ADDITIONAL RECOMMENDATIONS. 

## 2019-05-03 NOTE — Telephone Encounter (Signed)
Dr. Oneida Alar signed and I faxed back yesterday afternoon.

## 2019-06-06 ENCOUNTER — Encounter: Payer: Self-pay | Admitting: Gastroenterology

## 2019-06-06 ENCOUNTER — Other Ambulatory Visit: Payer: Self-pay

## 2019-06-06 ENCOUNTER — Ambulatory Visit (INDEPENDENT_AMBULATORY_CARE_PROVIDER_SITE_OTHER): Payer: Medicaid Other | Admitting: Gastroenterology

## 2019-06-06 DIAGNOSIS — W19XXXA Unspecified fall, initial encounter: Secondary | ICD-10-CM | POA: Insufficient documentation

## 2019-06-06 DIAGNOSIS — K50819 Crohn's disease of both small and large intestine with unspecified complications: Secondary | ICD-10-CM

## 2019-06-06 HISTORY — DX: Unspecified fall, initial encounter: W19.XXXA

## 2019-06-06 MED ORDER — WALKER WHEELS MISC: 0 refills | Status: DC

## 2019-06-06 NOTE — Assessment & Plan Note (Signed)
SYMPTOMS CONTROLLED/RESOLVED. PT QUIT SMOKING.  CONGRATULATION ON NOT SMOKING. EAT TO LIVE AND THINK OF FOOD AS MEDICINE. DRINK WATER TO KEEP YOUR URINE LIGHT YELLOW. To have more energy and to lose weight:      1. CONTINUE YOUR WEIGHT LOSS EFFORTS. I RECOMMEND YOU READ AND FOLLOW RECOMMENDATIONS BY DR. MARK HYMAN, "10-DAY DETOX DIET".    2. If you must eat bread, EAT EZEKIEL BREAD. IT IS IN THE FROZEN SECTION OF THE GROCERY STORE.    3. Do not drink SODA, GATORADE, FRUIT JUICE, ENERGY DRINKS, OR DIET SODA.     4. AVOID HIGH FRUCTOSE CORN SYRUP.     5. DO NOT chew SUGAR FREE GUM OR USE ARTIFICIAL SWEETENERS. IF NEEDED USE STEVIA AS A SWEETENER.    6. DO NOT EAT ENRICHED WHEAT FLOUR, PASTA, RICE, OR CEREAL.    7. ONLY EAT WILD CAUGHT SEAFOOD, GRASS FED BEEF OR CHICKEN, PORK FROM PASTURE RAISED PIGS, OR EGGS FROM PASTURE RAISED CHICKENS.    8. PRACTICE CHAIR YOGA FOR 15-30 MINS 3 OR 4 TIMES A WEEK AND PROGRESS TO HATHA YOGA OVER NEXT 6 MOS.    9. CONTINUE TO TAKE MULTIVITAMIN, VITAMIN B12, AND VITAMIN D3 2000 IU DAILY.  I WILL GET LABS FROM DR. KIKEL.  FOLLOW UP IN 6 MOS.

## 2019-06-06 NOTE — Patient Instructions (Signed)
CONGRATULATION ON NOT SMOKING.  EAT TO LIVE AND THINK OF FOOD AS MEDICINE.  DRINK WATER TO KEEP YOUR URINE LIGHT YELLOW.  To have more energy and to lose weight:      1. CONTINUE YOUR WEIGHT LOSS EFFORTS. I RECOMMEND YOU READ AND FOLLOW RECOMMENDATIONS BY DR. MARK HYMAN, "10-DAY DETOX DIET".    2. If you must eat bread, EAT EZEKIEL BREAD. IT IS IN THE FROZEN SECTION OF THE GROCERY STORE.    3. Do not drink SODA, GATORADE, FRUIT JUICE, ENERGY DRINKS, OR DIET SODA.     4. AVOID HIGH FRUCTOSE CORN SYRUP.     5. DO NOT chew SUGAR FREE GUM OR USE ARTIFICIAL SWEETENERS. IF NEEDED USE STEVIA AS A SWEETENER.    6. DO NOT EAT ENRICHED WHEAT FLOUR, PASTA, RICE, OR CEREAL.    7. ONLY EAT WILD CAUGHT SEAFOOD, GRASS FED BEEF OR CHICKEN, PORK FROM PASTURE RAISED PIGS, OR EGGS FROM PASTURE RAISED CHICKENS.    8. PRACTICE CHAIR YOGA FOR 15-30 MINS 3 OR 4 TIMES A WEEK AND PROGRESS TO HATHA YOGA OVER NEXT 6 MOS.    9. CONTINUE TO TAKE MULTIVITAMIN, VITAMIN B12, AND VITAMIN D3 2000 IU DAILY.   I WILL GET LABS FROM DR. KIKEL.  FOLLOW UP IN 6 MOS.

## 2019-06-06 NOTE — Progress Notes (Signed)
Subjective:    Patient ID: [name], male    DOB: Sep 24, 1966, 53 y.o.   MRN: [id number]  [name] Schwalbe, MD  HPI  BMs: EMPTYING BAG(1-5X/DAY). NOW ON MEDS TO REDUCE UTIs. STILL GETS DIARRHEA AVERY AM. MILK: NO CHEESE: NO ICE CREAM: NONE IN 6 MOS. NO DAIRY PRODUCTS. NO CIGS IN 6 MOS. HAVING TROUBLE WALKING FOR PAST MO AND NOW WITH A  CANE. ALSO TROUBLE WITH FLUID IN HIS LEGS AND ANKLES.WOULD LIKE A WALKER WITH WHEELS.  PT DENIES FEVER, CHILLS, HEMATOCHEZIA, HEMATEMESIS, nausea, vomiting, melena,  CHEST PAIN, SHORTNESS OF BREATH,  CHANGE IN BOWEL IN HABITS, constipation, abdominal pain, problems swallowing, problems with sedation, OR heartburn or indigestion.  Past Medical History:  Diagnosis Date  . Arthritis   . Asthma   . 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 Bipolar disorder ([place])   . Colostomy in place [facility])   . Crohn's colitis ([place])    s/p total colectomy with ileostomy in 2009  . Crohn's disease of small intestine [facility]) Sept 2012   ileoscopy: multiple ulcers likely secondary to Crohn's  . DEEP VENOUS THROMBOPHLEBITIS, HX OF 02/11/2009   Qualifier: Diagnosis of  By: Zeb Comfort    . Depression   . DVT (deep venous thrombosis) ([place]) 2009   right upper extremity due to PICC  . Enterococcus UTI 2009  . GERD (gastroesophageal reflux disease)   . Hernia   . Hyperlipidemia   . Insomnia   . Migraine headache   . Nystagmus   . PULMONARY EMBOLISM, HX OF 02/11/2009   Qualifier: Diagnosis of  By: Zeb Comfort    . S/P endoscopy Sept 2012   mild gastritis, small hiatal hernia, no ulcers  . Schizophrenia ([place])   . Schizophrenia, acute [facility])     Past Surgical History:  Procedure Laterality Date  . APPENDECTOMY    . ESOPHAGOGASTRODUODENOSCOPY  07/2010   gastritis,  bx neg for H.Pylori  . ESOPHAGOGASTRODUODENOSCOPY N/A 01/08/2014   SLF:NO SOURCE FOR ODYNOPHAGIA IDENTIFIED/Multiple small ulcers in the gastric antrum  .  EYE SURGERY    . ILEOSCOPY  06/29/2011   SLF: ulcers,multiple/small HH/mild gastritis  . KIDNEY SURGERY    . LAPAROTOMY N/A 06/05/2013   Procedure: EXPLORATORY LAPAROTOMY;  Surgeon: Donato Heinz, MD;  Location: [facility];  Service: General;  Laterality: N/A;  . LYSIS OF ADHESION N/A 06/05/2013   Procedure: LYSIS OF ADHESIONS;  Surgeon: Donato Heinz, MD;  Location: [facility];  Service: General;  Laterality: N/A;  . small bowel capsule study  08/2010   few tiny nonbleeding erosions/ulcers ?secondary to relafen or Crohn's. Entire small bowel not seen.   Marland Kitchen TOTAL COLECTOMY  2009   with ileostomy at [facility] for refractory disease   . TOTAL HIP ARTHROPLASTY     right, due to avascular necrosis from chronic prednisone  . TOTAL SHOULDER REPLACEMENT     bilateral   Allergies  Allergen Reactions  . Aspirin     REACTION: unknown reaction  . Bactrim [Sulfamethoxazole-Trimethoprim]     ABD CRAMPS AND DIARRHEA  . Codeine     REACTION: unknown reaction  . Ibuprofen     REACTION: unknown reaction  . Penicillins     REACTION: unknown reaction  . Remicade [Infliximab]     COULDN'T BREATHE  . Tramadol Hcl     REACTION: unknown reaction  . Vicodin [Hydrocodone-Acetaminophen] Nausea Only    Current Outpatient  Medications  Medication Sig    . acetaminophen (TYLENOL) 500 MG tablet Take 1,000 mg by mouth every 6 (six) hours as needed. For pain     . albuterol (PROAIR HFA) 108 (90 BASE) MCG/ACT inhaler Inhale 2 puffs into the lungs every 6 (six) hours as needed for wheezing or shortness of breath.    Marland Kitchen albuterol (PROVENTIL) (2.5 MG/3ML) 0.083% nebulizer solution Take 2.5 mg by nebulization every 6 (six) hours as needed for wheezing or shortness of breath.    Marland Kitchen atorvastatin (LIPITOR) 80 MG tablet Take 80 mg by mouth daily.    . benztropine (COGENTIN) 0.5 MG tablet Take 0.5 mg by mouth daily.     Marland Kitchen CALCIUM 600/VITAMIN D3 600-800 MG-UNIT TABS TAKE (1) TABLET BY MOUTH THREE TIMES DAILY AFTER MEALS.    Marland Kitchen  Cyanocobalamin (B-12) 1000 MCG CAPS Take by mouth daily.    Marland Kitchen dicyclomine (BENTYL) 10 MG capsule TAKE 1 OR 2 CAPSULE BY MOUTH AS DIRECTED BEFORE MEALS AND AT BEDTIME.    . divalproex (DEPAKOTE ER) 250 MG 24 hr tablet Take 250 mg by mouth daily.     . divalproex (DEPAKOTE) 500 MG 24 hr tablet Take 1,000 mg by mouth every evening.     . docusate sodium (COLACE) 100 MG capsule Take 1 capsule (100 mg total) by mouth 2 (two) times daily.    . folic acid (FOLVITE) 1 MG tablet Take 1 mg by mouth daily.      . furosemide (LASIX) 40 MG tablet Take 40 mg by mouth daily.      Marland Kitchen gabapentin (NEURONTIN) 300 MG capsule Take 900 mg by mouth 3 (three) times daily.     Marland Kitchen lidocaine (XYLOCAINE) 2 % solution 2 TSP  PO Q4-6H PRN FOR HEARTBURN OR UPPER ABDOMINAL PAIN    . mercaptopurine (PURINETHOL) 50 MG tablet TAKE 2 TABLETS DAILY ON EMPTY STOMACH 1 HOUR BEFORE OR 2 HOURS AFTER M EAL    . metFORMIN (GLUCOPHAGE) 500 MG tablet Take by mouth 2 (two) times daily with a meal.    . mirtazapine (REMERON) 15 MG tablet Take 15 mg by mouth at bedtime.    . pantoprazole (PROTONIX) 40 MG tablet TAKE (1) TABLET BY MOUTH TWICE A DAY BEFORE MEALS. (BREAKFAST AND SUPPER)    . potassium chloride SA (K-DUR,KLOR-CON) 20 MEQ tablet Take 20 mEq by mouth daily.     . risperiDONE (RISPERDAL) 2 MG tablet Take 2 mg by mouth at bedtime. 3.5 tabs qhs    . sertraline (ZOLOFT) 50 MG tablet Take 50 mg by mouth daily.    . tamsulosin (FLOMAX) 0.4 MG CAPS capsule Take 0.4 mg by mouth daily.     Review of Systems PER HPI OTHERWISE ALL SYSTEMS ARE NEGATIVE.    Objective:   Physical Exam Vitals signs reviewed.  Constitutional:      General: He is not in acute distress.    Appearance: He is well-developed.  HENT:     Head: Normocephalic and atraumatic.  Eyes:     General: No scleral icterus.    Pupils: Pupils are equal, round, and reactive to light.     Comments: NYSTAGMUS  Neck:     Musculoskeletal: Normal range of motion and neck  supple.  Cardiovascular:     Rate and Rhythm: Normal rate and regular rhythm.     Heart sounds: Normal heart sounds.  Pulmonary:     Effort: Pulmonary effort is normal. No respiratory distress.     Breath sounds:  Normal breath sounds.  Abdominal:     General: Bowel sounds are normal. There is no distension.     Palpations: Abdomen is soft.     Tenderness: There is no abdominal tenderness.     Comments: Ostomy in [place].  Musculoskeletal:     Right lower leg: Edema present.     Left lower leg: Edema present.     Comments: SUPPORT STOCKINGS IN PLACE, WALKS ASSISTED WITH A CANE.  Lymphadenopathy:     Cervical: No cervical adenopathy.  Skin:    General: Skin is warm and dry.  Neurological:     Mental Status: He is alert and oriented to person, place, and time.     Comments: NO  NEW FOCAL DEFICITS  Psychiatric:        Mood and Affect: Mood normal.     Comments: NORMAL AFFECT        Assessment & Plan:

## 2019-06-06 NOTE — Assessment & Plan Note (Addendum)
ETIOLOGY UNCLEAR AND ASSOCIATED WITH SWELLINGIN LEGS.  CHANGE DIET. LOSE WEIGHT. WALKER WITH WHEELS OR CANE TO AMBULATE. FOLLOW UP IN 6 MOS.

## 2019-06-07 ENCOUNTER — Telehealth: Payer: Self-pay

## 2019-06-07 NOTE — Telephone Encounter (Signed)
T/C from pharmacist at [facility] requesting diagnosis code for the walker with wheels. 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

## 2019-06-07 NOTE — Telephone Encounter (Signed)
REVIEWED. AGREE. NO ADDITIONAL RECOMMENDATIONS. 

## 2019-06-07 NOTE — Progress Notes (Signed)
ON RECALL  °

## 2019-06-08 ENCOUNTER — Telehealth: Payer: Self-pay

## 2019-06-08 NOTE — Telephone Encounter (Signed)
Pt called and said he was given a regular walker on 2 wheels and he would like to have the one with 4 wheels and a seat, since his legs give out walking.  I called the pharmacist and spoke to [place], and he said to send in order to a Rolator ( that is the walker pt described).  Forwarding to Dr. Oneida Alar to send in.

## 2019-06-08 NOTE — Telephone Encounter (Signed)
Faxed the order to the pharmacy and received confirmation.

## 2019-06-08 NOTE — Telephone Encounter (Signed)
Please fax labs to endoscopy.

## 2019-06-08 NOTE — Telephone Encounter (Signed)
Faxed to Endo.

## 2019-06-08 NOTE — Telephone Encounter (Signed)
FAX PRESCRIPTION FOR A ROTATOR TO THE PAHARMACY, #1, RFx0.

## 2019-06-08 NOTE — Telephone Encounter (Signed)
Received labs from PCP and placed on Dr. Nona Dell seat for review.

## 2019-06-26 ENCOUNTER — Other Ambulatory Visit: Payer: Self-pay

## 2019-06-26 ENCOUNTER — Telehealth: Payer: Self-pay | Admitting: Gastroenterology

## 2019-06-26 DIAGNOSIS — R197 Diarrhea, unspecified: Secondary | ICD-10-CM

## 2019-06-26 NOTE — Telephone Encounter (Signed)
These have been ordered and released for [facility] per pt request.

## 2019-06-26 NOTE — Telephone Encounter (Signed)
Pt is aware. Wants to do at [facility]. I can only get the GI profile pcr to come up on [facility]. Dr. Oneida Alar, please advise!

## 2019-06-26 NOTE — Telephone Encounter (Signed)
Pt has had diarrhea for the last 2 days with some abdominal cramping. No blood in stool. Dr. Oneida Alar, please advise!

## 2019-06-26 NOTE — Telephone Encounter (Signed)
Pt called asking for DS. Please call him at [phone]

## 2019-06-26 NOTE — Telephone Encounter (Signed)
PLEASE CALL PT. He should submit stool sample FOR GI panel/C DIFF PCR and see his PCP TO CHECK HIS URINE.

## 2019-06-26 NOTE — Telephone Encounter (Signed)
CALL [facility] AND ASK FOR ORDER NUMBERS FOR GI PATH PANEL AND C DIFF PCR.

## 2019-07-10 ENCOUNTER — Telehealth: Payer: Self-pay | Admitting: Gastroenterology

## 2019-07-10 LAB — GASTROINTESTINAL PATHOGEN PANEL PCR
C. difficile Tox A/B, PCR: NOT DETECTED
Campylobacter, PCR: NOT DETECTED
Cryptosporidium, PCR: NOT DETECTED
E coli (ETEC) LT/ST PCR: NOT DETECTED
E coli (STEC) stx1/stx2, PCR: NOT DETECTED
E coli 0157, PCR: NOT DETECTED
Giardia lamblia, PCR: NOT DETECTED
Norovirus, PCR: NOT DETECTED
Rotavirus A, PCR: NOT DETECTED
Salmonella, PCR: NOT DETECTED
Shigella, PCR: NOT DETECTED

## 2019-07-10 LAB — CLOSTRIDIUM DIFFICILE TOXIN B, QUALITATIVE, REAL-TIME PCR: Toxigenic C. Difficile by PCR: NOT DETECTED

## 2019-07-10 NOTE — Telephone Encounter (Signed)
PLEASE CALL PT. HIS C DIFF TEST IS NEGATIVE. WE ARE WAITING ON THE GI STOOL PANEL.

## 2019-07-10 NOTE — Telephone Encounter (Signed)
PT is aware of C Diff results and that we will call on the GI pathogen when resulted.

## 2019-07-16 NOTE — Progress Notes (Signed)
CC'D TO PCP °

## 2019-07-16 NOTE — Progress Notes (Signed)
cc'd to pcp 

## 2019-07-25 ENCOUNTER — Other Ambulatory Visit (HOSPITAL_COMMUNITY)
Admission: RE | Admit: 2019-07-25 | Discharge: 2019-07-25 | Disposition: A | Discharge status: Home / Self Care | Payer: Medicaid Other | Source: Ambulatory Visit | Attending: Urology | Admitting: Urology

## 2019-07-25 ENCOUNTER — Ambulatory Visit (INDEPENDENT_AMBULATORY_CARE_PROVIDER_SITE_OTHER): Payer: Medicaid Other | Admitting: Urology

## 2019-07-25 DIAGNOSIS — R3914 Feeling of incomplete bladder emptying: Secondary | ICD-10-CM

## 2019-07-25 DIAGNOSIS — N3000 Acute cystitis without hematuria: Secondary | ICD-10-CM | POA: Insufficient documentation

## 2019-07-26 LAB — URINE CULTURE: Culture: >=100000 — AB

## 2019-08-03 ENCOUNTER — Other Ambulatory Visit: Payer: Self-pay | Admitting: Gastroenterology

## 2019-08-07 ENCOUNTER — Other Ambulatory Visit (HOSPITAL_COMMUNITY)
Admission: RE | Admit: 2019-08-07 | Discharge: 2019-08-07 | Disposition: A | Discharge status: Home / Self Care | Payer: Medicaid Other | Source: Ambulatory Visit | Attending: Urology | Admitting: Urology

## 2019-08-07 DIAGNOSIS — N3000 Acute cystitis without hematuria: Secondary | ICD-10-CM | POA: Diagnosis present

## 2019-08-07 LAB — URINALYSIS, COMPLETE (UACMP) WITH MICROSCOPIC
Bilirubin Urine: NEGATIVE
Glucose, UA: NEGATIVE mg/dL
Ketones, ur: NEGATIVE mg/dL
Nitrite: POSITIVE — AB
Protein, ur: >300 mg/dL — AB
Specific Gravity, Urine: <1.005 — ABNORMAL LOW (ref 1.005–1.030)
pH: >9 — ABNORMAL HIGH (ref 5.0–8.0)

## 2019-08-10 LAB — URINE CULTURE: Culture: >=100000 — AB

## 2019-08-29 ENCOUNTER — Other Ambulatory Visit: Payer: Self-pay | Admitting: Gastroenterology

## 2019-09-05 ENCOUNTER — Ambulatory Visit (INDEPENDENT_AMBULATORY_CARE_PROVIDER_SITE_OTHER): Payer: Medicaid Other | Admitting: Urology

## 2019-09-05 DIAGNOSIS — R3914 Feeling of incomplete bladder emptying: Secondary | ICD-10-CM

## 2019-09-05 DIAGNOSIS — N3000 Acute cystitis without hematuria: Secondary | ICD-10-CM

## 2019-09-27 ENCOUNTER — Other Ambulatory Visit: Payer: Self-pay | Admitting: Gastroenterology

## 2019-10-31 ENCOUNTER — Ambulatory Visit (INDEPENDENT_AMBULATORY_CARE_PROVIDER_SITE_OTHER): Payer: Medicaid Other | Admitting: Urology

## 2019-10-31 ENCOUNTER — Encounter: Payer: Self-pay | Admitting: Urology

## 2019-10-31 ENCOUNTER — Other Ambulatory Visit: Payer: Self-pay

## 2019-10-31 VITALS — BP 108/70 | HR 84 | Temp 98.1°F | Ht 66.0 in | Wt 220.0 lb

## 2019-10-31 DIAGNOSIS — R339 Retention of urine, unspecified: Secondary | ICD-10-CM | POA: Insufficient documentation

## 2019-10-31 DIAGNOSIS — N138 Other obstructive and reflux uropathy: Secondary | ICD-10-CM | POA: Diagnosis not present

## 2019-10-31 DIAGNOSIS — N401 Enlarged prostate with lower urinary tract symptoms: Secondary | ICD-10-CM

## 2019-10-31 NOTE — Progress Notes (Signed)
Urological Symptom Review  Patient is experiencing the following symptoms: Urinary tract infection   Review of Systems  Gastrointestinal (upper)  : Negative for upper GI symptoms  Gastrointestinal (lower) : Negative for lower GI symptoms  Constitutional : Negative for symptoms  Skin: Negative for skin symptoms  Eyes: Negative for eye symptoms  Ear/Nose/Throat : Negative for Ear/Nose/Throat symptoms  Hematologic/Lymphatic: Negative for Hematologic/Lymphatic symptoms  Cardiovascular : Negative for cardiovascular symptoms  Respiratory : Negative for respiratory symptoms  Endocrine: Negative for endocrine symptoms  Musculoskeletal: Joint pain  Neurological: Negative for neurological symptoms  Psychologic: Depression Negative for psychiatric symptoms

## 2019-10-31 NOTE — Progress Notes (Signed)
10/31/2019 2:54 PM   [name] 1966/08/27 [id number]  Referring provider: [name], MD [address] [place],  [place] [address]  Incomplete emptying  HPI: [name] is a 54yo with a hx of BPH here for followup. He was unable to get UDS due to inability to pass the UDS catheter.  He has failed medical medical therapies for BPH. He gets UTIs due to incomplete emptying.  His records from [facility] are as follows: 2.11.2020: [name] comes in for the 1st time in about 2 years. He has had less frequent urinary tract infections, last treated in October of 2019 for a coag-negative staph. Currently, he is not on tamsulosin. He does feel like he has a good stream, and usually he empties pretty well. He attributes his decreased frequency of UTIs to coming off of carbonated beverages.  7.21.2020: He reports today that he has not been having a sensation of incomplete emptying since last visit.  07/25/2019: PVR is 173cc. He has worsening urgency, frequency and nocturia on flomax 0.33m daily  09/05/2019: PVR 121cc. rapaflo failed to improve his LUTS    PMH: Past Medical History:  Diagnosis Date  . Arthritis   . Asthma   . 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 Bipolar disorder ([facility]   . [facility] in [facility]   . Crohn's colitis ([facility]    s/p total colectomy with ileostomy in 2009  . Crohn's disease of small intestine ([facility] Sept 2012   ileoscopy: multiple ulcers likely secondary to Crohn's  . DEEP VENOUS THROMBOPHLEBITIS, HX OF 02/11/2009   Qualifier: Diagnosis of  By: SZeb Comfort   . Depression   . DVT (deep venous thrombosis) ([facility] 2009   right upper extremity due to PICC  . Enterococcus UTI 2009  . GERD (gastroesophageal reflux disease)   . Hernia   . Hyperlipidemia   . Insomnia   . Migraine headache   . Nystagmus   . PULMONARY EMBOLISM, HX OF 02/11/2009   Qualifier: Diagnosis of  By: SZeb Comfort   . S/P  endoscopy Sept 2012   mild gastritis, small hiatal hernia, no ulcers  . Schizophrenia ([place]   . Schizophrenia, acute ([facility]     Surgical History: Past Surgical History:  Procedure Laterality Date  . APPENDECTOMY    . ESOPHAGOGASTRODUODENOSCOPY  07/2010   gastritis,  bx neg for H.Pylori  . ESOPHAGOGASTRODUODENOSCOPY N/A 01/08/2014   SLF:NO SOURCE FOR ODYNOPHAGIA IDENTIFIED/Multiple small ulcers in the gastric antrum  . EYE SURGERY    . ILEOSCOPY  06/29/2011   SLF: ulcers,multiple/small HH/mild gastritis  . KIDNEY SURGERY    . LAPAROTOMY N/A 06/05/2013   Procedure: EXPLORATORY LAPAROTOMY;  Surgeon: BDonato Heinz MD;  Location: [facility];  Service: General;  Laterality: N/A;  . LYSIS OF ADHESION N/A 06/05/2013   Procedure: LYSIS OF ADHESIONS;  Surgeon: BDonato Heinz MD;  Location: [facility];  Service: General;  Laterality: N/A;  . small bowel capsule study  08/2010   few tiny nonbleeding erosions/ulcers ?secondary to relafen or Crohn's. Entire small bowel not seen.   .Marland KitchenTOTAL COLECTOMY  2009   [facility]   . TOTAL HIP ARTHROPLASTY     right, due to avascular necrosis from chronic prednisone  . TOTAL SHOULDER REPLACEMENT     bilateral    Home Medications:  Allergies as of 10/31/2019  Reactions   Aspirin    REACTION: unknown reaction   Bactrim [sulfamethoxazole-trimethoprim]    ABD CRAMPS AND DIARRHEA   Codeine    REACTION: unknown reaction   Ibuprofen    REACTION: unknown reaction   Penicillins    REACTION: unknown reaction   Remicade [infliximab]    COULDN'T BREATHE   Tramadol Hcl    REACTION: unknown reaction   Vicodin [hydrocodone-acetaminophen] Nausea Only      Medication List       Accurate as of October 31, 2019  2:54 PM. If you have any questions, ask your nurse or doctor.        acetaminophen 500 MG tablet Commonly known as: TYLENOL Take 1,000 mg by mouth every 6 (six) hours as needed. For pain   atorvastatin 80 MG  tablet Commonly known as: LIPITOR Take 80 mg by mouth daily.   B-12 1000 MCG Caps Take by mouth daily.   benztropine 0.5 MG tablet Commonly known as: COGENTIN Take 0.5 mg by mouth daily.   Calcium 600/Vitamin D3 600-800 MG-UNIT Tabs Generic drug: Calcium Carb-Cholecalciferol TAKE (1) TABLET BY MOUTH THREE TIMES DAILY AFTER MEALS.   dicyclomine 10 MG capsule Commonly known as: BENTYL TAKE 1 OR 2 CAPSULE BY MOUTH AS DIRECTED BEFORE MEALS AND AT BEDTIME.   divalproex 500 MG 24 hr tablet Commonly known as: DEPAKOTE ER Take 1,000 mg by mouth every evening.   divalproex 250 MG 24 hr tablet Commonly known as: DEPAKOTE ER Take 250 mg by mouth daily.   docusate sodium 100 MG capsule Commonly known as: COLACE Take 1 capsule (100 mg total) by mouth 2 (two) times daily.   folic acid 1 MG tablet Commonly known as: FOLVITE Take 1 mg by mouth daily.   furosemide 40 MG tablet Commonly known as: LASIX Take 40 mg by mouth daily.   gabapentin 300 MG capsule Commonly known as: NEURONTIN Take 900 mg by mouth 3 (three) times daily.   lidocaine 2 % solution Commonly known as: XYLOCAINE 2 TSP  PO Q4-6H PRN FOR HEARTBURN OR UPPER ABDOMINAL PAIN   mercaptopurine 50 MG tablet Commonly known as: PURINETHOL TAKE 2 TABLETS DAILY ON EMPTY STOMACH 1 HOUR BEFORE OR 2 HOURS AFTER M EAL   metFORMIN 500 MG tablet Commonly known as: GLUCOPHAGE Take by mouth 2 (two) times daily with a meal.   mirtazapine 15 MG tablet Commonly known as: REMERON Take 15 mg by mouth at bedtime.   pantoprazole 40 MG tablet Commonly known as: PROTONIX TAKE (1) TABLET BY MOUTH TWICE A DAY BEFORE MEALS. (BREAKFAST AND SUPPER)   potassium chloride SA 20 MEQ tablet Commonly known as: KLOR-CON Take 20 mEq by mouth daily.   albuterol (2.5 MG/3ML) 0.083% nebulizer solution Commonly known as: PROVENTIL Take 2.5 mg by nebulization every 6 (six) hours as needed for wheezing or shortness of breath.   ProAir HFA 108  (90 Base) MCG/ACT inhaler Generic drug: albuterol Inhale 2 puffs into the lungs every 6 (six) hours as needed for wheezing or shortness of breath.   risperiDONE 2 MG tablet Commonly known as: RISPERDAL Take 2 mg by mouth at bedtime. 3.5 tabs qhs   sertraline 50 MG tablet Commonly known as: ZOLOFT Take 50 mg by mouth daily.   tamsulosin 0.4 MG Caps capsule Commonly known as: FLOMAX Take 0.4 mg by mouth daily.   Walker Wheels Misc USE AS DIRECTED       Allergies:  Allergies  Allergen Reactions  . Aspirin  REACTION: unknown reaction  . Bactrim [Sulfamethoxazole-Trimethoprim]     ABD CRAMPS AND DIARRHEA  . Codeine     REACTION: unknown reaction  . Ibuprofen     REACTION: unknown reaction  . Penicillins     REACTION: unknown reaction  . Remicade [Infliximab]     COULDN'T BREATHE  . Tramadol Hcl     REACTION: unknown reaction  . Vicodin [Hydrocodone-Acetaminophen] Nausea Only    Family History: Family History  Problem Relation Age of Onset  . Diabetes Mother   . Heart disease Mother   . Diabetes Father   . Heart disease Father     Social History:  reports that he has quit smoking. His smoking use included cigarettes. He smoked 1.00 pack per day. He has never used smokeless tobacco. He reports that he does not drink alcohol or use drugs.  ROS: All other review of systems were reviewed and are negative except what is noted above in HPI  Physical Exam: BP 108/70   Pulse 84   Temp 98.1 F (36.7 C)   Ht 5' 6"  (1.676 m)   Wt 220 lb (99.8 kg)   BMI 35.51 kg/m   Constitutional:  Alert and oriented, No acute distress. HEENT: [place] AT, moist mucus membranes.  Trachea midline, no masses. Cardiovascular: No clubbing, cyanosis, or edema. Respiratory: Normal respiratory effort, no increased work of breathing. GI: Abdomen is soft, nontender, nondistended, no abdominal masses GU: No CVA tenderness Lymph: No cervical or inguinal lymphadenopathy. Skin: No rashes,  bruises or suspicious lesions. Neurologic: Grossly intact, no focal deficits, moving all 4 extremities. Psychiatric: Normal mood and affect.  Laboratory Data: Lab Results  Component Value Date   WBC 3.6 (L) 10/09/2018   HGB 14.5 10/09/2018   HCT 41.4 10/09/2018   MCV 102.5 (H) 10/09/2018   PLT 156 10/09/2018    Lab Results  Component Value Date   CREATININE 0.89 10/09/2018    No results found for: PSA  No results found for: TESTOSTERONE  No results found for: HGBA1C  Urinalysis    Component Value Date/Time   COLORURINE YELLOW 08/07/2019 1419   APPEARANCEUR HAZY (A) 08/07/2019 1419   LABSPEC <1.005 (L) 08/07/2019 1419   PHURINE >9.0 (H) 08/07/2019 1419   GLUCOSEU NEGATIVE 08/07/2019 1419   HGBUR SMALL (A) 08/07/2019 1419   BILIRUBINUR NEGATIVE 08/07/2019 1419   KETONESUR NEGATIVE 08/07/2019 1419   PROTEINUR >300 (A) 08/07/2019 1419   UROBILINOGEN 0.2 10/17/2014 1940   NITRITE POSITIVE (A) 08/07/2019 1419   LEUKOCYTESUR MODERATE (A) 08/07/2019 1419    Lab Results  Component Value Date   BACTERIA MANY (A) 08/07/2019    Pertinent Imaging:  No results found for this or any previous visit. No results found for this or any previous visit. No results found for this or any previous visit. No results found for this or any previous visit. Results for orders placed during the hospital encounter of 04/27/16  US Renal   Narrative CLINICAL DATA:  Bladder neck alpha obstruction. Recurrent UTIs. History of Crohn's disease. Colostomy. EXAM: RENAL / URINARY TRACT ULTRASOUND COMPLETE COMPARISON:  CT 03/05/2014 . FINDINGS: Right Kidney: Length: 12.6 cm. Echogenicity within normal limits. No mass or hydronephrosis visualized. Left Kidney: Length: 11.9 cm. Echogenicity within normal limits. No mass or hydronephrosis visualized. Bladder: Appears normal for degree of bladder distention. Bilateral ureteral jets noted. IMPRESSION: No acute or focal  abnormality. Electronically Signed   By: Marcello Moores  Register   On: 04/27/2016 13:37   No results  found for this or any previous visit. No results found for this or any previous visit. No results found for this or any previous visit.   10/31/19  CC: No chief complaint on file.   HPI:  Blood pressure 108/70, pulse 84, temperature 98.1 F (36.7 C), height 5' 6"  (1.676 m), weight 220 lb (99.8 kg). NED. A&Ox3.   No respiratory distress   Abd soft, NT, ND Normal phallus with bilateral descended testicles  Cystoscopy Procedure Note  Patient identification was confirmed, informed consent was obtained, and patient was prepped using Betadine solution.  Lidocaine jelly was administered per urethral meatus.     Pre-Procedure: - Inspection reveals a normal caliber ureteral meatus.  Procedure: The flexible cystoscope was introduced without difficulty - No urethral strictures/lesions are present. - Enlarged prostate bilobar hyperplasia without median lobe - Elevated bladder neck - Bilateral ureteral orifices identified - Bladder mucosa  reveals no ulcers, tumors, or lesions - No bladder stones - No trabeculation  Retroflexion shows no intravesical prostatic protrusion   Post-Procedure: - Patient tolerated the procedure well  Assessment/ Plan:   No follow-ups on file.  [name], MD  Assessment & Plan:    1. Benign prostatic hyperplasia with urinary obstruction We discussed the management including continued medical therapy, TURP, PVP, Urolift, rezum. After discussing the options the patient elects for TURP. Risks/benefits/alternatives discussed  2. Incomplete emptying of bladder -Schedule for TURP   No follow-ups on file.  [name], MD  [facility] [place]

## 2019-10-31 NOTE — Patient Instructions (Signed)
Transurethral Resection of the Prostate Transurethral resection of the prostate (TURP) is the removal (resection) of part of the gland that produces semen (prostate gland). This procedure is done to treat benign prostatic hyperplasia (BPH). BPH is an abnormal, noncancerous (benign) increase in the number of cells that make up the prostate tissue. BPH causes the prostate to get bigger. The enlarged prostate can push against or block the tube that drains urine from the bladder out of the body (urethra). BPH can affect normal urine flow by causing bladder infections, difficulty controlling bladder function, and difficulty emptying the bladder.  The goal of TURP is to remove enough prostate tissue to allow for a normal flow of urine. The procedure will allow you to empty your bladder more completely when you urinate so that you can urinate less often. In a transurethral resection, a thin telescope with a light, a tiny camera, and an electric cutting edge (resectoscope) is passed through the urethra and into the prostate. The opening of the urethra is at the end of the penis. Tell a health care provider about:  Any allergies you have.  All medicines you are taking, including vitamins, herbs, eye drops, creams, and over-the-counter medicines.  Any problems you or family members have had with anesthetic medicines.  Any blood disorders you have.  Any surgeries you have had.  Any medical conditions you have.  Any prostate infections you have had. What are the risks? Generally, this is a safe procedure. However, problems may occur, including:  Infection.  Bleeding.  Allergic reactions to medicines.  Damage to other structures or organs, such as: ? The urethra. ? The bladder. ? Muscles that surround the prostate.  Difficulty getting an erection.  Inability to control when you urinate (incontinence).  Scarring, which may cause problems with urine flow. What happens before the  procedure? Medicines Ask your health care provider about:  Changing or stopping your regular medicines. This is especially important if you are taking diabetes medicines or blood thinners.  Taking medicines such as aspirin and ibuprofen. These medicines can thin your blood. Do not take these medicines unless your health care provider tells you to take them.  Taking over-the-counter medicines, vitamins, herbs, and supplements. Eating and drinking Follow instructions from your health care provider about eating and drinking, which may include:  8 hours before the procedure - stop eating heavy meals or foods, such as meat, fried foods, or fatty foods.  6 hours before the procedure - stop eating light meals or foods, such as toast or cereal.  6 hours before the procedure - stop drinking milk or drinks that contain milk.  2 hours before the procedure - stop drinking clear liquids. Staying hydrated Follow instructions from your health care provider about hydration, which may include:  Up to 2 hours before the procedure - you may continue to drink clear liquids, such as water, clear fruit juice, black coffee, and plain tea. General instructions  You may have a physical exam.  You may have a blood or urine sample taken.  Ask your health care provider what steps will be taken to help prevent infection. These may include: ? Washing skin with a germ-killing soap. ? Taking antibiotic medicine.  Plan to have someone take you home from the hospital or clinic. You may not be able to drive for up to 10 days after your procedure.  Plan to have a responsible adult care for you for at least 24 hours after you leave the hospital or  clinic. This is important. What happens during the procedure?   An IV will be inserted into one of your veins.  You will be given one or more of the following: ? A medicine to help you relax (sedative). ? A medicine to make you fall asleep (general anesthetic). ? A  medicine that is injected into your spine to numb the area below and slightly above the injection site (spinal anesthetic).  Your legs will be placed in foot rests (stirrups) so that your legs are apart and your knees are bent.  The resectoscope will be passed through your urethra to your prostate.  Parts of your prostate will be resected using the cutting edge of the resectoscope.  The resectoscope will be removed.  A small, thin tube (catheter) will be passed through your urethra and into your bladder. The catheter will drain urine into a bag outside of your body. ? Fluid may be passed through the catheter to keep the catheter open. The procedure may vary among health care providers and hospitals. What happens after the procedure?  Your blood pressure, heart rate, breathing rate, and blood oxygen level will be monitored until you leave the hospital or clinic.  You may continue to receive fluids and medicines through an IV.  You may have some pain. Pain medicine will be available to help you.  You will have a catheter draining your urine. ? You may have blood in your urine. Your catheter may be kept in until your urine is clear. ? Your urinary drainage will be monitored. If necessary, your bladder may be rinsed out (irrigated) through your catheter.  You will be encouraged to walk around as soon as possible.  You may have to wear compression stockings. These stockings help prevent blood clots and reduce swelling in your legs.  Do not drive for 24 hours if you were given a sedative during your procedure. Summary  Transurethral resection of the prostate (TURP) is the removal (resection) of part of the gland that produces semen (prostate gland).  The goal of this procedure is to remove enough prostate tissue to allow for a normal flow of urine.  Follow instructions from your health care provider about taking medicines and about eating and drinking before the procedure. This  information is not intended to replace advice given to you by your health care provider. Make sure you discuss any questions you have with your health care provider. Document Revised: 01/10/2019 Document Reviewed: 06/21/2018 Elsevier Patient Education  [place].

## 2019-11-05 ENCOUNTER — Other Ambulatory Visit: Payer: Self-pay | Admitting: Urology

## 2019-11-07 ENCOUNTER — Other Ambulatory Visit: Payer: Self-pay | Admitting: Urology

## 2019-11-08 ENCOUNTER — Encounter: Payer: Self-pay | Admitting: Gastroenterology

## 2019-11-08 ENCOUNTER — Other Ambulatory Visit: Payer: Self-pay | Admitting: Gastroenterology

## 2019-11-26 ENCOUNTER — Other Ambulatory Visit: Payer: Self-pay

## 2019-11-26 ENCOUNTER — Telehealth: Payer: Self-pay | Admitting: Urology

## 2019-11-26 DIAGNOSIS — R339 Retention of urine, unspecified: Secondary | ICD-10-CM

## 2019-11-26 NOTE — Telephone Encounter (Signed)
Returned pt call. Placed on nurse visit schedule for 2/23 for urine drop off.

## 2019-11-26 NOTE — Telephone Encounter (Signed)
Patient requests a nurse return his call regarding a UTI.

## 2019-11-27 ENCOUNTER — Other Ambulatory Visit (HOSPITAL_COMMUNITY)
Admission: AD | Admit: 2019-11-27 | Discharge: 2019-11-27 | Disposition: A | Discharge status: Home / Self Care | Payer: Medicaid Other | Source: Other Acute Inpatient Hospital | Attending: Urology | Admitting: Urology

## 2019-11-27 ENCOUNTER — Ambulatory Visit: Payer: Medicaid Other

## 2019-11-27 DIAGNOSIS — R300 Dysuria: Secondary | ICD-10-CM

## 2019-11-27 DIAGNOSIS — R339 Retention of urine, unspecified: Secondary | ICD-10-CM | POA: Diagnosis present

## 2019-11-27 NOTE — Patient Instructions (Signed)
Your procedure is scheduled on: 12/03/2019  Report to [facility] at   6:30  AM.  Call this number if you have problems the morning of surgery: [phone]   Remember:   Do not Eat or Drink after midnight         No Smoking the morning of surgery  :  Take these medicines the morning of surgery with A SIP OF WATER: Depakote, Protonix and ertraline   Do not wear jewelry, make-up or nail polish.  Do not wear lotions, powders, or perfumes. You may wear deodorant.  Do not shave 48 hours prior to surgery. Men may shave face and neck.  Do not bring valuables to the hospital.  Contacts, dentures or bridgework may not be worn into surgery.  Leave suitcase in the car. After surgery it may be brought to your room.  For patients admitted to the hospital, checkout time is 11:00 AM the day of discharge.   Patients discharged the day of surgery will not be allowed to drive home.    Special Instructions: Shower using CHG night before surgery and shower the day of surgery use CHG.  Use special wash - you have one bottle of CHG for all showers.  You should use approximately 1/2 of the bottle for each shower.  Transurethral Resection of the Prostate Transurethral resection of the prostate (TURP) is the removal (resection) of part of the gland that produces semen (prostate gland). This procedure is done to treat benign prostatic hyperplasia (BPH). BPH is an abnormal, noncancerous (benign) increase in the number of cells that make up the prostate tissue. BPH causes the prostate to get bigger. The enlarged prostate can push against or block the tube that drains urine from the bladder out of the body (urethra). BPH can affect normal urine flow by causing bladder infections, difficulty controlling bladder function, and difficulty emptying the bladder.  The goal of TURP is to remove enough prostate tissue to allow for a normal flow of urine. The procedure will allow you to empty your bladder more completely when  you urinate so that you can urinate less often. In a transurethral resection, a thin telescope with a light, a tiny camera, and an electric cutting edge (resectoscope) is passed through the urethra and into the prostate. The opening of the urethra is at the end of the penis. Tell a health care provider about:  Any allergies you have.  All medicines you are taking, including vitamins, herbs, eye drops, creams, and over-the-counter medicines.  Any problems you or family members have had with anesthetic medicines.  Any blood disorders you have.  Any surgeries you have had.  Any medical conditions you have.  Any prostate infections you have had. What are the risks? Generally, this is a safe procedure. However, problems may occur, including:  Infection.  Bleeding.  Allergic reactions to medicines.  Damage to other structures or organs, such as: ? The urethra. ? The bladder. ? Muscles that surround the prostate.  Difficulty getting an erection.  Inability to control when you urinate (incontinence).  Scarring, which may cause problems with urine flow. What happens before the procedure? Medicines Ask your health care provider about:  Changing or stopping your regular medicines. This is especially important if you are taking diabetes medicines or blood thinners.  Taking medicines such as aspirin and ibuprofen. These medicines can thin your blood. Do not take these medicines unless your health care provider tells you to take them.  Taking over-the-counter medicines,  vitamins, herbs, and supplements. Eating and drinking Follow instructions from your health care provider about eating and drinking, which may include:  8 hours before the procedure - stop eating heavy meals or foods, such as meat, fried foods, or fatty foods.  6 hours before the procedure - stop eating light meals or foods, such as toast or cereal.  6 hours before the procedure - stop drinking milk or drinks that  contain milk.  2 hours before the procedure - stop drinking clear liquids. Staying hydrated Follow instructions from your health care provider about hydration, which may include:  Up to 2 hours before the procedure - you may continue to drink clear liquids, such as water, clear fruit juice, black coffee, and plain tea. General instructions  You may have a physical exam.  You may have a blood or urine sample taken.  Ask your health care provider what steps will be taken to help prevent infection. These may include: ? Washing skin with a germ-killing soap. ? Taking antibiotic medicine.  Plan to have someone take you home from the hospital or clinic. You may not be able to drive for up to 10 days after your procedure.  Plan to have a responsible adult care for you for at least 24 hours after you leave the hospital or clinic. This is important. What happens during the procedure?   An IV will be inserted into one of your veins.  You will be given one or more of the following: ? A medicine to help you relax (sedative). ? A medicine to make you fall asleep (general anesthetic). ? A medicine that is injected into your spine to numb the area below and slightly above the injection site (spinal anesthetic).  Your legs will be placed in foot rests (stirrups) so that your legs are apart and your knees are bent.  The resectoscope will be passed through your urethra to your prostate.  Parts of your prostate will be resected using the cutting edge of the resectoscope.  The resectoscope will be removed.  A small, thin tube (catheter) will be passed through your urethra and into your bladder. The catheter will drain urine into a bag outside of your body. ? Fluid may be passed through the catheter to keep the catheter open. The procedure may vary among health care providers and hospitals. What happens after the procedure?  Your blood pressure, heart rate, breathing rate, and blood oxygen  level will be monitored until you leave the hospital or clinic.  You may continue to receive fluids and medicines through an IV.  You may have some pain. Pain medicine will be available to help you.  You will have a catheter draining your urine. ? You may have blood in your urine. Your catheter may be kept in until your urine is clear. ? Your urinary drainage will be monitored. If necessary, your bladder may be rinsed out (irrigated) through your catheter.  You will be encouraged to walk around as soon as possible.  You may have to wear compression stockings. These stockings help prevent blood clots and reduce swelling in your legs.  Do not drive for 24 hours if you were given a sedative during your procedure. Summary  Transurethral resection of the prostate (TURP) is the removal (resection) of part of the gland that produces semen (prostate gland).  The goal of this procedure is to remove enough prostate tissue to allow for a normal flow of urine.  Follow instructions from your health care  provider about taking medicines and about eating and drinking before the procedure. This information is not intended to replace advice given to you by your health care provider. Make sure you discuss any questions you have with your health care provider. Document Revised: 01/10/2019 Document Reviewed: 06/21/2018 Elsevier Patient Education  [place].  Transurethral Resection of the Prostate, Care After This sheet gives you information about how to care for yourself after your procedure. Your health care provider may also give you more specific instructions. If you have problems or questions, contact your health care provider. What can I expect after the procedure? After the procedure, it is common to have:  Mild pain in your lower abdomen.  Soreness or mild discomfort in your penis from having the catheter inserted during the procedure.  A feeling of urgency when you need to  urinate.  A small amount of blood in your urine. You may notice some small blood clots in your urine. These are normal. Follow these instructions at home: Medicines  Take over-the-counter and prescription medicines only as told by your health care provider.  If you were prescribed an antibiotic medicine, take it as told by your health care provider. Do not stop taking the antibiotic even if you start to feel better.  Ask your health care provider if the medicine prescribed to you: ? Requires you to avoid driving or using heavy machinery. ? Can cause constipation. You may need to take actions to prevent or treat constipation, such as:  Take over-the-counter or prescription medicines.  Eat foods that are high in fiber, such as fresh fruits and vegetables, whole grains, and beans.  Limit foods that are high in fat and processed sugars, such as fried or sweet foods.  Do not drive for 24 hours if you were given a sedative during your procedure. Activity   Return to your normal activities as told by your health care provider. Ask your health care provider what activities are safe for you.  Do not lift anything that is heavier than 10 lb (4.5 kg), or the limit that you are told, for 3 weeks after the procedure or until your health care provider says that it is safe.  Avoid intense physical activity for as long as told by your health care provider.  Avoid sitting for a long time without moving. Get up and move around one or more times every few hours. This helps to prevent blood clots. You may increase your physical activity gradually as you start to feel better. Lifestyle  Do not drink alcohol for as long as told by your health care provider. This is especially important if you are taking prescription pain medicines.  Do not engage in sexual activity until your health care provider says that you can do this. General instructions   Do not take baths, swim, or use a hot tub until your  health care provider approves.  Drink enough fluid to keep your urine pale yellow.  Urinate as soon as you feel the need to. Do not try to hold your urine for long periods of time.  If your health care provider approves, you may take a stool softener for 2-3 weeks to prevent you from straining to have a bowel movement.  Wear compression stockings as told by your health care provider. These stockings help to prevent blood clots and reduce swelling in your legs.  Keep all follow-up visits as told by your health care provider. This is important. Contact a health care  provider if you have:  Difficulty urinating.  A fever.  Pain that gets worse or does not improve with medicine.  Blood in your urine that does not go away after 1 week of resting and drinking more fluids.  Swelling in your penis or testicles. Get help right away if:  You are unable to urinate.  You are having more blood clots in your urine instead of fewer.  You have: ? Large blood clots. ? A lot of blood in your urine. ? Pain in your back or lower abdomen. ? Pain or swelling in your legs. ? Chills and you are shaking. ? Difficulty breathing or shortness of breath. Summary  After the procedure, it is common to have a small amount of blood in your urine.  Avoid heavy lifting and intense physical activity for as long as told by your health care provider.  Urinate as soon as you feel the need to. Do not try to hold your urine for long periods of time.  Keep all follow-up visits as told by your health care provider. This is important. This information is not intended to replace advice given to you by your health care provider. Make sure you discuss any questions you have with your health care provider. Document Revised: 01/10/2019 Document Reviewed: 06/21/2018 Elsevier Patient Education  2020 [place] Anesthesia, Adult, Care After This sheet gives you information about how to care for yourself  after your procedure. Your health care provider may also give you more specific instructions. If you have problems or questions, contact your health care provider. What can I expect after the procedure? After the procedure, the following side effects are common:  Pain or discomfort at the IV site.  Nausea.  Vomiting.  Sore throat.  Trouble concentrating.  Feeling cold or chills.  Weak or tired.  Sleepiness and fatigue.  Soreness and body aches. These side effects can affect parts of the body that were not involved in surgery. Follow these instructions at home:  For at least 24 hours after the procedure:  Have a responsible adult stay with you. It is important to have someone help care for you until you are awake and alert.  Rest as needed.  Do not: ? Participate in activities in which you could fall or become injured. ? Drive. ? Use heavy machinery. ? Drink alcohol. ? Take sleeping pills or medicines that cause drowsiness. ? Make important decisions or sign legal documents. ? Take care of children on your own. Eating and drinking  Follow any instructions from your health care provider about eating or drinking restrictions.  When you feel hungry, start by eating small amounts of foods that are soft and easy to digest (bland), such as toast. Gradually return to your regular diet.  Drink enough fluid to keep your urine pale yellow.  If you vomit, rehydrate by drinking water, juice, or clear broth. General instructions  If you have sleep apnea, surgery and certain medicines can increase your risk for breathing problems. Follow instructions from your health care provider about wearing your sleep device: ? Anytime you are sleeping, including during daytime naps. ? While taking prescription pain medicines, sleeping medicines, or medicines that make you drowsy.  Return to your normal activities as told by your health care provider. Ask your health care provider what  activities are safe for you.  Take over-the-counter and prescription medicines only as told by your health care provider.  If you smoke, do not smoke without supervision.  Keep all follow-up visits as told by your health care provider. This is important. Contact a health care provider if:  You have nausea or vomiting that does not get better with medicine.  You cannot eat or drink without vomiting.  You have pain that does not get better with medicine.  You are unable to pass urine.  You develop a skin rash.  You have a fever.  You have redness around your IV site that gets worse. Get help right away if:  You have difficulty breathing.  You have chest pain.  You have blood in your urine or stool, or you vomit blood. Summary  After the procedure, it is common to have a sore throat or nausea. It is also common to feel tired.  Have a responsible adult stay with you for the first 24 hours after general anesthesia. It is important to have someone help care for you until you are awake and alert.  When you feel hungry, start by eating small amounts of foods that are soft and easy to digest (bland), such as toast. Gradually return to your regular diet.  Drink enough fluid to keep your urine pale yellow.  Return to your normal activities as told by your health care provider. Ask your health care provider what activities are safe for you. This information is not intended to replace advice given to you by your health care provider. Make sure you discuss any questions you have with your health care provider. Document Revised: 09/23/2017 Document Reviewed: 05/06/2017 Elsevier Patient Education  [place].

## 2019-11-27 NOTE — Progress Notes (Signed)
Pt came office today and gave urine sample for urine drop off. Specimen collected and taken to lab.

## 2019-11-28 LAB — URINE CULTURE

## 2019-11-30 ENCOUNTER — Other Ambulatory Visit: Payer: Self-pay

## 2019-11-30 ENCOUNTER — Encounter (HOSPITAL_COMMUNITY)
Admission: RE | Admit: 2019-11-30 | Discharge: 2019-11-30 | Disposition: A | Discharge status: Home / Self Care | Payer: Medicaid Other | Source: Ambulatory Visit | Attending: Urology | Admitting: Urology

## 2019-11-30 ENCOUNTER — Encounter (HOSPITAL_COMMUNITY): Payer: Self-pay

## 2019-11-30 ENCOUNTER — Other Ambulatory Visit (HOSPITAL_COMMUNITY)
Admission: RE | Admit: 2019-11-30 | Discharge: 2019-11-30 | Disposition: A | Discharge status: Home / Self Care | Payer: Medicaid Other | Source: Ambulatory Visit | Attending: Urology | Admitting: Urology

## 2019-11-30 DIAGNOSIS — Z01812 Encounter for preprocedural laboratory examination: Secondary | ICD-10-CM | POA: Diagnosis present

## 2019-11-30 DIAGNOSIS — Z20822 Contact with and (suspected) exposure to covid-19: Secondary | ICD-10-CM | POA: Insufficient documentation

## 2019-11-30 LAB — SARS CORONAVIRUS 2 (TAT 6-24 HRS): SARS Coronavirus 2: NEGATIVE

## 2019-11-30 LAB — BASIC METABOLIC PANEL
Anion gap: 8 (ref 5–15)
BUN: 8 mg/dL (ref 6–20)
CO2: 25 mmol/L (ref 22–32)
Calcium: 8.7 mg/dL — ABNORMAL LOW (ref 8.9–10.3)
Chloride: 103 mmol/L (ref 98–111)
Creatinine, Ser: 0.69 mg/dL (ref 0.61–1.24)
GFR calc Af Amer: >60 mL/min (ref >60)
GFR calc non Af Amer: >60 mL/min (ref >60)
Glucose, Bld: 149 mg/dL — ABNORMAL HIGH (ref 70–99)
Potassium: 4.6 mmol/L (ref 3.5–5.1)
Sodium: 136 mmol/L (ref 135–145)

## 2019-11-30 LAB — HEMOGLOBIN A1C
Hgb A1c MFr Bld: 6.1 % — ABNORMAL HIGH (ref 4.8–5.6)
Mean Plasma Glucose: 128.37 mg/dL

## 2019-12-03 ENCOUNTER — Ambulatory Visit (HOSPITAL_COMMUNITY): Payer: Medicaid Other | Admitting: Anesthesiology

## 2019-12-03 ENCOUNTER — Encounter (HOSPITAL_COMMUNITY): Payer: Self-pay | Admitting: Urology

## 2019-12-03 ENCOUNTER — Observation Stay (HOSPITAL_COMMUNITY)
Admission: RE | Admit: 2019-12-03 | Discharge: 2019-12-04 | Disposition: A | Discharge status: Home / Self Care | Payer: Medicaid Other | Attending: Urology | Admitting: Urology

## 2019-12-03 ENCOUNTER — Other Ambulatory Visit: Payer: Self-pay

## 2019-12-03 ENCOUNTER — Encounter (HOSPITAL_COMMUNITY)
Admission: RE | Disposition: A | Discharge status: Home / Self Care | Payer: Self-pay | Source: Home / Self Care | Attending: Urology

## 2019-12-03 DIAGNOSIS — K219 Gastro-esophageal reflux disease without esophagitis: Secondary | ICD-10-CM | POA: Insufficient documentation

## 2019-12-03 DIAGNOSIS — N401 Enlarged prostate with lower urinary tract symptoms: Secondary | ICD-10-CM | POA: Diagnosis present

## 2019-12-03 DIAGNOSIS — Z96641 Presence of right artificial hip joint: Secondary | ICD-10-CM | POA: Diagnosis not present

## 2019-12-03 DIAGNOSIS — Z933 Colostomy status: Secondary | ICD-10-CM | POA: Insufficient documentation

## 2019-12-03 DIAGNOSIS — Z86718 Personal history of other venous thrombosis and embolism: Secondary | ICD-10-CM | POA: Diagnosis not present

## 2019-12-03 DIAGNOSIS — R351 Nocturia: Secondary | ICD-10-CM | POA: Insufficient documentation

## 2019-12-03 DIAGNOSIS — R350 Frequency of micturition: Secondary | ICD-10-CM | POA: Diagnosis not present

## 2019-12-03 DIAGNOSIS — Z86711 Personal history of pulmonary embolism: Secondary | ICD-10-CM | POA: Insufficient documentation

## 2019-12-03 DIAGNOSIS — R3915 Urgency of urination: Secondary | ICD-10-CM | POA: Diagnosis not present

## 2019-12-03 DIAGNOSIS — Z87891 Personal history of nicotine dependence: Secondary | ICD-10-CM | POA: Insufficient documentation

## 2019-12-03 DIAGNOSIS — Z9049 Acquired absence of other specified parts of digestive tract: Secondary | ICD-10-CM | POA: Insufficient documentation

## 2019-12-03 DIAGNOSIS — N400 Enlarged prostate without lower urinary tract symptoms: Secondary | ICD-10-CM | POA: Diagnosis present

## 2019-12-03 HISTORY — PX: TRANSURETHRAL RESECTION OF PROSTATE: SHX73

## 2019-12-03 LAB — GLUCOSE, CAPILLARY
Glucose-Capillary: 146 mg/dL — ABNORMAL HIGH (ref 70–99)
Glucose-Capillary: 136 mg/dL — ABNORMAL HIGH (ref 70–99)
Glucose-Capillary: 94 mg/dL (ref 70–99)

## 2019-12-03 SURGERY — TURP (TRANSURETHRAL RESECTION OF PROSTATE)
Anesthesia: Spinal | Site: Prostate

## 2019-12-03 MED ORDER — ALBUTEROL SULFATE (2.5 MG/3ML) 0.083% IN NEBU: 2.5000 mg | INHALATION_SOLUTION | Freq: Four times a day (QID) | RESPIRATORY_TRACT | Status: DC | PRN

## 2019-12-03 MED ORDER — DOCUSATE SODIUM 100 MG PO CAPS
100.0000 mg | ORAL_CAPSULE | Freq: Two times a day (BID) | ORAL | Status: DC
  Administered 2019-12-03 – 2019-12-04 (×3): 100 mg via ORAL
  Filled 2019-12-03 (×3): qty 1

## 2019-12-03 MED ORDER — PROMETHAZINE HCL 25 MG/ML IJ SOLN: 6.2500 mg | INTRAMUSCULAR | Status: DC | PRN

## 2019-12-03 MED ORDER — IPRATROPIUM-ALBUTEROL 0.5-2.5 (3) MG/3ML IN SOLN
3.0000 mL | Freq: Once | RESPIRATORY_TRACT | Status: AC
  Administered 2019-12-03: 3 mL via RESPIRATORY_TRACT

## 2019-12-03 MED ORDER — ATORVASTATIN CALCIUM 40 MG PO TABS
80.0000 mg | ORAL_TABLET | Freq: Every day | ORAL | Status: DC
  Administered 2019-12-03 – 2019-12-04 (×2): 80 mg via ORAL
  Filled 2019-12-03 (×2): qty 2

## 2019-12-03 MED ORDER — ONDANSETRON HCL 4 MG/2ML IJ SOLN
INTRAMUSCULAR | Status: DC | PRN
  Administered 2019-12-03: 4 mg via INTRAVENOUS

## 2019-12-03 MED ORDER — SERTRALINE HCL 50 MG PO TABS
50.0000 mg | ORAL_TABLET | Freq: Every day | ORAL | Status: DC
  Administered 2019-12-03 – 2019-12-04 (×2): 50 mg via ORAL
  Filled 2019-12-03 (×3): qty 1

## 2019-12-03 MED ORDER — IPRATROPIUM-ALBUTEROL 0.5-2.5 (3) MG/3ML IN SOLN
RESPIRATORY_TRACT | Status: AC
  Filled 2019-12-03: qty 3

## 2019-12-03 MED ORDER — MIRTAZAPINE 15 MG PO TABS
15.0000 mg | ORAL_TABLET | Freq: Every day | ORAL | Status: DC
  Administered 2019-12-03: 15 mg via ORAL
  Filled 2019-12-03: qty 1

## 2019-12-03 MED ORDER — BELLADONNA ALKALOIDS-OPIUM 16.2-60 MG RE SUPP: 1.0000 | Freq: Four times a day (QID) | RECTAL | Status: DC | PRN

## 2019-12-03 MED ORDER — PROPOFOL 500 MG/50ML IV EMUL
INTRAVENOUS | Status: DC | PRN
  Administered 2019-12-03: 25 ug/kg/min via INTRAVENOUS

## 2019-12-03 MED ORDER — BENZTROPINE MESYLATE 1 MG PO TABS
0.5000 mg | ORAL_TABLET | Freq: Every day | ORAL | Status: DC
  Administered 2019-12-03 – 2019-12-04 (×2): 0.5 mg via ORAL
  Filled 2019-12-03 (×2): qty 1

## 2019-12-03 MED ORDER — LACTATED RINGERS IV SOLN: INTRAVENOUS | Status: DC

## 2019-12-03 MED ORDER — SODIUM CHLORIDE 0.9 % IR SOLN
Status: DC | PRN
  Administered 2019-12-03 (×4): 3000 mL

## 2019-12-03 MED ORDER — HYDROMORPHONE HCL 1 MG/ML IJ SOLN
0.5000 mg | INTRAMUSCULAR | Status: DC | PRN
  Administered 2019-12-03 (×2): 0.5 mg via INTRAVENOUS
  Filled 2019-12-03 (×2): qty 0.5

## 2019-12-03 MED ORDER — MIDAZOLAM HCL 5 MG/5ML IJ SOLN
INTRAMUSCULAR | Status: DC | PRN
  Administered 2019-12-03 (×2): 1 mg via INTRAVENOUS

## 2019-12-03 MED ORDER — SODIUM CHLORIDE 0.9 % IV SOLN
2.0000 g | INTRAVENOUS | Status: AC
  Administered 2019-12-03: 2 g via INTRAVENOUS
  Filled 2019-12-03: qty 20

## 2019-12-03 MED ORDER — PROPOFOL 10 MG/ML IV BOLUS
INTRAVENOUS | Status: DC | PRN
  Administered 2019-12-03: 20 mg via INTRAVENOUS

## 2019-12-03 MED ORDER — PANTOPRAZOLE SODIUM 40 MG PO TBEC
40.0000 mg | DELAYED_RELEASE_TABLET | Freq: Every day | ORAL | Status: DC
  Administered 2019-12-03 – 2019-12-04 (×2): 40 mg via ORAL
  Filled 2019-12-03 (×2): qty 1

## 2019-12-03 MED ORDER — GABAPENTIN 300 MG PO CAPS
900.0000 mg | ORAL_CAPSULE | Freq: Three times a day (TID) | ORAL | Status: DC
  Administered 2019-12-03 – 2019-12-04 (×4): 900 mg via ORAL
  Filled 2019-12-03 (×4): qty 3

## 2019-12-03 MED ORDER — MIDAZOLAM HCL 2 MG/2ML IJ SOLN
INTRAMUSCULAR | Status: AC
  Filled 2019-12-03: qty 2

## 2019-12-03 MED ORDER — FENTANYL CITRATE (PF) 250 MCG/5ML IJ SOLN
INTRAMUSCULAR | Status: AC
  Filled 2019-12-03: qty 5

## 2019-12-03 MED ORDER — DIPHENHYDRAMINE HCL 12.5 MG/5ML PO ELIX: 12.5000 mg | ORAL_SOLUTION | Freq: Four times a day (QID) | ORAL | Status: DC | PRN

## 2019-12-03 MED ORDER — OXYCODONE-ACETAMINOPHEN 5-325 MG PO TABS
1.0000 | ORAL_TABLET | ORAL | Status: DC | PRN
  Administered 2019-12-03 – 2019-12-04 (×3): 2 via ORAL
  Filled 2019-12-03 (×3): qty 2

## 2019-12-03 MED ORDER — DIPHENHYDRAMINE HCL 50 MG/ML IJ SOLN: 12.5000 mg | Freq: Four times a day (QID) | INTRAMUSCULAR | Status: DC | PRN

## 2019-12-03 MED ORDER — POTASSIUM CHLORIDE CRYS ER 20 MEQ PO TBCR
20.0000 meq | EXTENDED_RELEASE_TABLET | Freq: Every day | ORAL | Status: DC
  Administered 2019-12-04: 20 meq via ORAL
  Filled 2019-12-03: qty 1

## 2019-12-03 MED ORDER — MERCAPTOPURINE 50 MG PO TABS
100.0000 mg | ORAL_TABLET | Freq: Every day | ORAL | Status: DC
  Administered 2019-12-04: 100 mg via ORAL
  Filled 2019-12-03 (×3): qty 2

## 2019-12-03 MED ORDER — MEPERIDINE HCL 50 MG/ML IJ SOLN: 6.2500 mg | INTRAMUSCULAR | Status: DC | PRN

## 2019-12-03 MED ORDER — TRAZODONE HCL 50 MG PO TABS
50.0000 mg | ORAL_TABLET | Freq: Every day | ORAL | Status: DC
  Administered 2019-12-03: 50 mg via ORAL
  Filled 2019-12-03: qty 1

## 2019-12-03 MED ORDER — FUROSEMIDE 40 MG PO TABS
40.0000 mg | ORAL_TABLET | Freq: Every day | ORAL | Status: DC
  Administered 2019-12-04: 40 mg via ORAL
  Filled 2019-12-03 (×2): qty 1

## 2019-12-03 MED ORDER — PHENYLEPHRINE HCL (PRESSORS) 10 MG/ML IV SOLN
INTRAVENOUS | Status: DC | PRN
  Administered 2019-12-03 (×2): 80 ug via INTRAVENOUS

## 2019-12-03 MED ORDER — MELOXICAM 7.5 MG PO TABS
7.5000 mg | ORAL_TABLET | Freq: Every day | ORAL | Status: DC
  Administered 2019-12-03 – 2019-12-04 (×2): 7.5 mg via ORAL
  Filled 2019-12-03 (×2): qty 1

## 2019-12-03 MED ORDER — PROPOFOL 10 MG/ML IV BOLUS
INTRAVENOUS | Status: AC
  Filled 2019-12-03: qty 40

## 2019-12-03 MED ORDER — ONDANSETRON HCL 4 MG/2ML IJ SOLN: 4.0000 mg | INTRAMUSCULAR | Status: DC | PRN

## 2019-12-03 MED ORDER — GENTAMICIN SULFATE 40 MG/ML IJ SOLN
5.0000 mg/kg | Freq: Once | INTRAVENOUS | Status: DC
  Filled 2019-12-03: qty 9.75

## 2019-12-03 MED ORDER — SODIUM CHLORIDE 0.9 % IR SOLN: 3000.0000 mL | Status: DC

## 2019-12-03 MED ORDER — WATER FOR IRRIGATION, STERILE IR SOLN
Status: DC | PRN
  Administered 2019-12-03: 500 mL

## 2019-12-03 MED ORDER — SODIUM CHLORIDE 0.9 % IV SOLN: INTRAVENOUS | Status: DC

## 2019-12-03 MED ORDER — DIVALPROEX SODIUM ER 500 MG PO TB24
1000.0000 mg | ORAL_TABLET | Freq: Every evening | ORAL | Status: DC
  Administered 2019-12-03: 1000 mg via ORAL
  Filled 2019-12-03: qty 2

## 2019-12-03 MED ORDER — ALBUTEROL SULFATE HFA 108 (90 BASE) MCG/ACT IN AERS: 2.0000 | INHALATION_SPRAY | Freq: Four times a day (QID) | RESPIRATORY_TRACT | Status: DC | PRN

## 2019-12-03 MED ORDER — SODIUM CHLORIDE 0.9 % IR SOLN
3000.0000 mL | Status: AC
  Administered 2019-12-03 – 2019-12-04 (×2): 3000 mL

## 2019-12-03 MED ORDER — ACETAMINOPHEN 325 MG PO TABS: 650.0000 mg | ORAL_TABLET | ORAL | Status: DC | PRN

## 2019-12-03 MED ORDER — HYDROMORPHONE HCL 1 MG/ML IJ SOLN: 0.2500 mg | INTRAMUSCULAR | Status: DC | PRN

## 2019-12-03 MED ORDER — RISPERIDONE 1 MG PO TABS
3.0000 mg | ORAL_TABLET | Freq: Every day | ORAL | Status: DC
  Administered 2019-12-03: 3 mg via ORAL
  Filled 2019-12-03: qty 3

## 2019-12-03 MED ORDER — ONDANSETRON HCL 4 MG/2ML IJ SOLN
INTRAMUSCULAR | Status: AC
  Filled 2019-12-03: qty 2

## 2019-12-03 MED ORDER — DIVALPROEX SODIUM ER 250 MG PO TB24
250.0000 mg | ORAL_TABLET | Freq: Every day | ORAL | Status: DC
  Administered 2019-12-04: 250 mg via ORAL
  Filled 2019-12-03 (×3): qty 1

## 2019-12-03 SURGICAL SUPPLY — 25 items
BAG DRAIN URO TABLE W/ADPT NS (BAG) ×3 IMPLANT
BAG DRN 8 ADPR NS SKTRN CSTL (BAG) ×1
BAG DRN URN TUBE DRIP CHMBR (OSTOMY) ×1
BAG URINE DRAIN TURP 4L (OSTOMY) ×3 IMPLANT
CATH FOLEY 3WAY 30CC 22F (CATHETERS) ×3 IMPLANT
CLOTH BEACON ORANGE TIMEOUT ST (SAFETY) ×3 IMPLANT
ELECT REM PT RETURN 9FT ADLT (ELECTROSURGICAL) ×3
ELECTRODE REM PT RTRN 9FT ADLT (ELECTROSURGICAL) ×1 IMPLANT
GLOVE BIO SURGEON STRL SZ8 (GLOVE) ×3 IMPLANT
GLOVE BIOGEL PI IND STRL 7.0 (GLOVE) ×2 IMPLANT
GLOVE BIOGEL PI INDICATOR 7.0 (GLOVE) ×4
GLOVE ECLIPSE 6.5 STRL STRAW (GLOVE) ×2 IMPLANT
GOWN STRL REUS W/TWL LRG LVL3 (GOWN DISPOSABLE) ×3 IMPLANT
GOWN STRL REUS W/TWL XL LVL3 (GOWN DISPOSABLE) ×3 IMPLANT
IV NS IRRIG 3000ML ARTHROMATIC (IV SOLUTION) ×16 IMPLANT
KIT TURNOVER CYSTO (KITS) ×3 IMPLANT
LOOP CUT BIPOLAR 24F LRG (ELECTROSURGICAL) ×3 IMPLANT
MANIFOLD NEPTUNE II (INSTRUMENTS) ×3 IMPLANT
PACK CYSTO (CUSTOM PROCEDURE TRAY) ×3 IMPLANT
PAD ARMBOARD 7.5X6 YLW CONV (MISCELLANEOUS) ×3 IMPLANT
SYR 30ML LL (SYRINGE) ×3 IMPLANT
SYR TOOMEY IRRIG 70ML (MISCELLANEOUS) ×3
SYRINGE TOOMEY IRRIG 70ML (MISCELLANEOUS) IMPLANT
TOWEL OR 17X26 4PK STRL BLUE (TOWEL DISPOSABLE) ×3 IMPLANT
WATER STERILE IRR 500ML POUR (IV SOLUTION) ×3 IMPLANT

## 2019-12-03 NOTE — Anesthesia Procedure Notes (Signed)
Spinal  Patient location during procedure: OR Preanesthetic Checklist Completed: patient identified, IV checked, site marked, risks and benefits discussed, surgical consent, monitors and equipment checked, pre-op evaluation and timeout performed Spinal Block Patient position: sitting Prep: Betadine Patient monitoring: heart rate, cardiac monitor, continuous pulse ox and blood pressure Approach: midline Location: L3-4 Injection technique: single-shot Needle Needle type: Pencan  Needle gauge: 24 G Needle length: 10 cm Assessment Sensory level: T10 Additional Notes ATTEMPTS:CRNA one attempt unsuccessful, Dr. Charna Elizabeth one attempt TRAY ID:1 TRAY EXPIRATION DATE:07/03/2020

## 2019-12-03 NOTE — Op Note (Signed)
Preoperative diagnosis: BPH, with urinary urgency  Postoperative diagnosis: BPH  Procedure: 1 cystoscopy 2. Transurethral resection of the prostate  Attending: [name]  Anesthesia: General  Estimated blood loss: 150cc  Drains: 22 French foley  Specimens: 1. Prostate Chips  Antibiotics: Rocephin  Findings: bilobar prostate enlargement. High blader neck Ureteral orifices in normal anatomic location.   Indications: Patient is a 54 year old male with a history of BPH and elevated PVR with urinary urgency.  After discussing treatment options, they decided proceed with transurethral resection of the prostate.  Procedure her in detail: The patient was brought to the operating room and a brief timeout was done to ensure correct patient, correct procedure, correct site.  General anesthesia was administered patient was placed in dorsal lithotomy position.  Their genitalia was then prepped and draped in usual sterile fashion.  A rigid 55 French cystoscope was passed in the urethra and the bladder.  Bladder was inspected and we noted no masses or lesions.  the ureteral orifices were in the normal orthotopic locations. removed the cystoscope and placed a resectoscope into the bladder. We then turned our attention to the prostate resection. Using the bipolar resectoscope we resected the bladder neck to the verumontanum. We then started at the 12 oclock position on the left lobe and resection to the 6 o'clock position from the bladder neck to the verumontanum. We then did the same resection of the right lobe. Once the resection was complete we then cauterized individual bleeders. We then removed the prostate chips and sent them for pathology.  We then re-inspected the prostatic fossa and found no residual bleeding.  the bladder was then drained, a 22 French foley was placed and this concluded the procedure which was well tolerated by patient.  Complications: None  Condition: Stable, extubated,  transferred to PACU  Plan: Patient is admitted overnight with continuous bladder irrigation. If their urine is clear tomorrow they will be discharged home and followup in 5 days for foley catheter removal and pathology discussion.

## 2019-12-03 NOTE — H&P (Signed)
Urology Admission H&P  Chief Complaint: urinary urgency  History of Present Illness: [name] is a 54yo with a hx of BPH with LUTS here for TURP. He has failed medical therapy. He continues to have severe urgency, frequency and nocturia. No fevers/chills/sweats  Past Medical History:  Diagnosis Date  . Arthritis   . Asthma   . 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 Bipolar disorder ([place])   . Colostomy in place [facility])   . Crohn's colitis ([place])    s/p total colectomy with ileostomy in 2009  . Crohn's disease of small intestine [facility]) Sept 2012   ileoscopy: multiple ulcers likely secondary to Crohn's  . DEEP VENOUS THROMBOPHLEBITIS, HX OF 02/11/2009   Qualifier: Diagnosis of  By: Zeb Comfort    . Depression   . DVT (deep venous thrombosis) ([place]) 2009   right upper extremity due to PICC  . Enterococcus UTI 2009  . GERD (gastroesophageal reflux disease)   . Hernia   . Hyperlipidemia   . Insomnia   . Migraine headache   . Nystagmus   . PULMONARY EMBOLISM, HX OF 02/11/2009   Qualifier: Diagnosis of  By: Zeb Comfort    . S/P endoscopy Sept 2012   mild gastritis, small hiatal hernia, no ulcers  . Schizophrenia ([place])   . Schizophrenia, acute [facility])    Past Surgical History:  Procedure Laterality Date  . APPENDECTOMY    . ESOPHAGOGASTRODUODENOSCOPY  07/2010   gastritis,  bx neg for H.Pylori  . ESOPHAGOGASTRODUODENOSCOPY N/A 01/08/2014   SLF:NO SOURCE FOR ODYNOPHAGIA IDENTIFIED/Multiple small ulcers in the gastric antrum  . EYE SURGERY    . ILEOSCOPY  06/29/2011   SLF: ulcers,multiple/small HH/mild gastritis  . KIDNEY SURGERY    . LAPAROTOMY N/A 06/05/2013   Procedure: EXPLORATORY LAPAROTOMY;  Surgeon: Donato Heinz, MD;  Location: [facility];  Service: General;  Laterality: N/A;  . LYSIS OF ADHESION N/A 06/05/2013   Procedure: LYSIS OF ADHESIONS;  Surgeon: Donato Heinz, MD;  Location: [facility];  Service: General;  Laterality: N/A;   . small bowel capsule study  08/2010   few tiny nonbleeding erosions/ulcers ?secondary to relafen or Crohn's. Entire small bowel not seen.   Marland Kitchen TOTAL COLECTOMY  2009   with ileostomy at [facility] for refractory disease   . TOTAL HIP ARTHROPLASTY     right, due to avascular necrosis from chronic prednisone  . TOTAL SHOULDER REPLACEMENT     bilateral    Home Medications:  Current Facility-Administered Medications  Medication Dose Route Frequency Provider Last Rate Last Admin  . cefTRIAXone (ROCEPHIN) 2 g in sodium chloride 0.9 % 100 mL IVPB  2 g Intravenous 30 min Pre-Op Ena Demary, Candee Furbish, MD      . gentamicin (GARAMYCIN) 390 mg in dextrose 5 % 100 mL IVPB  5 mg/kg (Adjusted) Intravenous Once Cleon Gustin, MD      . lactated ringers infusion   Intravenous Continuous Denese Killings, MD 50 mL/hr at 12/03/19 0747 New Bag at 12/03/19 0747   Allergies:  Allergies  Allergen Reactions  . Aspirin     REACTION: unknown reaction  . Bactrim [Sulfamethoxazole-Trimethoprim]     ABD CRAMPS AND DIARRHEA  . Codeine     REACTION: unknown reaction  . Ibuprofen     REACTION: unknown reaction  . Penicillins     REACTION: unknown reaction  . Remicade [Infliximab]     COULDN'T BREATHE  .  Tramadol Hcl     REACTION: unknown reaction  . Vicodin [Hydrocodone-Acetaminophen] Nausea Only    Family History  Problem Relation Age of Onset  . Diabetes Mother   . Heart disease Mother   . Diabetes Father   . Heart disease Father    Social History:  reports that he has quit smoking. His smoking use included cigarettes. He smoked 1.00 pack per day. He has never used smokeless tobacco. He reports that he does not drink alcohol or use drugs.  Review of Systems  Genitourinary: Positive for frequency and urgency.  All other systems reviewed and are negative.   Physical Exam:  Vital signs in last 24 hours: Temp:  [98.1 F (36.7 C)] 98.1 F (36.7 C) (03/01 0715) Pulse Rate:  [84] 84 (03/01  0715) Resp:  [18] 18 (03/01 0715) BP: (134)/(74) 134/74 (03/01 0715) SpO2:  [94 %] 94 % (03/01 0715) Physical Exam  Constitutional: He is oriented to person, place, and time. He appears well-developed and well-nourished.  HENT:  Head: Normocephalic and atraumatic.  Eyes: Pupils are equal, round, and reactive to light. EOM are normal.  Neck: No thyromegaly present.  Cardiovascular: Normal rate and regular rhythm.  Respiratory: Effort normal. No respiratory distress.  GI: Soft. He exhibits no distension.  Musculoskeletal:        General: No edema. Normal range of motion.     Cervical back: Normal range of motion.  Neurological: He is alert and oriented to person, place, and time.  Skin: Skin is warm and dry.  Psychiatric: He has a normal mood and affect. His behavior is normal. Judgment and thought content normal.    Laboratory Data:  Results for orders placed or performed during the hospital encounter of 12/03/19 (from the past 24 hour(s))  Glucose, capillary     Status: Abnormal   Collection Time: 12/03/19  7:15 AM  Result Value Ref Range   Glucose-Capillary 146 (H) 70 - 99 mg/dL   Recent Results (from the past 240 hour(s))  Culture, Urine     Status: Abnormal   Collection Time: 11/27/19  1:40 PM   Specimen: Urine, Clean Catch  Result Value Ref Range Status   Specimen Description   Final    URINE, CLEAN CATCH Performed at [facility], [address] [place], [place] [address]    Special Requests   Final    NONE Performed at [facility], [address], [place],  [address]    Culture MULTIPLE SPECIES PRESENT, SUGGEST RECOLLECTION (A)  Final   Report Status 11/28/2019 FINAL  Final  SARS CORONAVIRUS 2 (TAT 6-24 HRS) Nasopharyngeal Nasopharyngeal Swab     Status: None   Collection Time: 11/30/19  6:40 AM   Specimen: Nasopharyngeal Swab  Result Value Ref Range Status   SARS Coronavirus 2 NEGATIVE NEGATIVE Final    Comment: (NOTE) SARS-CoV-2 target nucleic acids are  NOT DETECTED. The SARS-CoV-2 RNA is generally detectable in upper and lower respiratory specimens during the acute phase of infection. Negative results do not preclude SARS-CoV-2 infection, do not rule out co-infections with other pathogens, and should not be used as the sole basis for treatment or other patient management decisions. Negative results must be combined with clinical observations, patient history, and epidemiological information. The expected result is Negative. Fact Sheet for Patients: SugarRoll.be Fact Sheet for Healthcare Providers: https://www.woods-mathews.com/ This test is not yet approved or cleared by the [place] FDA and  has been authorized for detection and/or diagnosis of SARS-CoV-2 by  FDA under an Emergency Use Authorization (EUA). This EUA will remain  in effect (meaning this test can be used) for the duration of the COVID-19 declaration under Section 56 4(b)(1) of the Act, 21 U.S.C. section 360bbb-3(b)(1), unless the authorization is terminated or revoked sooner. Performed at [facility], [place] [address] [place], [place] [address]    Creatinine: Recent Labs    11/30/19 1125  CREATININE 0.69   Baseline Creatinine: 0.7  Impression/Assessment:  53yo with BPH with urinary urgency  Plan:  The risks/benefits/alterantives to TURP was explained to the patient and he understands and wishes to proceed with surgery  [name] 12/03/2019, 8:07 AM

## 2019-12-03 NOTE — Transfer of Care (Signed)
Immediate Anesthesia Transfer of Care Note  Patient: [name]  Procedure(s) Performed: TRANSURETHRAL RESECTION OF THE PROSTATE (TURP) (N/A Prostate)  Patient Location: PACU  Anesthesia Type:Spinal  Level of Consciousness: awake  Airway & Oxygen Therapy: Patient Spontanous Breathing  Post-op Assessment: Report given to RN  Post vital signs: Reviewed  Last Vitals:  Vitals Value Taken Time  BP 112/71 12/03/19 0941  Temp    Pulse 98 12/03/19 0947  Resp 23 12/03/19 0947  SpO2 96 % 12/03/19 0947  Vitals shown include unvalidated device data.  Last Pain:  Vitals:   12/03/19 0715  PainSc: 0-No pain         Complications: No apparent anesthesia complications

## 2019-12-03 NOTE — Anesthesia Postprocedure Evaluation (Signed)
Anesthesia Post Note  Patient: [name]  Procedure(s) Performed: TRANSURETHRAL RESECTION OF THE PROSTATE (TURP) (N/A Prostate)  Patient location during evaluation: PACU Anesthesia Type: Spinal Level of consciousness: awake and alert and oriented Pain management: pain level controlled Vital Signs Assessment: post-procedure vital signs reviewed and stable Respiratory status: spontaneous breathing Cardiovascular status: blood pressure returned to baseline and stable Postop Assessment: spinal receding Anesthetic complications: no     Last Vitals:  Vitals:   12/03/19 1030 12/03/19 1031  BP: 118/72   Pulse: 84 83  Resp: 14 14  Temp:    SpO2: 94% 94%    Last Pain:  Vitals:   12/03/19 1031  PainSc: 0-No pain                 Lyndsey Demos

## 2019-12-03 NOTE — Discharge Instructions (Signed)
Indwelling Urinary Catheter Care, Adult An indwelling urinary catheter is a thin tube that is put into your bladder. The tube helps to drain pee (urine) out of your body. The tube goes in through your urethra. Your urethra is where pee comes out of your body. Your pee will come out through the catheter, then it will go into a bag (drainage bag). Take good care of your catheter so it will work well. How to wear your catheter and bag Supplies needed  Sticky tape (adhesive tape) or a leg strap.  Alcohol wipe or soap and water (if you use tape).  A clean towel (if you use tape).  Large overnight bag.  Smaller bag (leg bag). Wearing your catheter Attach your catheter to your leg with tape or a leg strap.  Make sure the catheter is not pulled tight.  If a leg strap gets wet, take it off and put on a dry strap.  If you use tape to hold the bag on your leg: 1. Use an alcohol wipe or soap and water to wash your skin where the tape made it sticky before. 2. Use a clean towel to pat-dry that skin. 3. Use new tape to make the bag stay on your leg. Wearing your bags You should have been given a large overnight bag.  You may wear the overnight bag in the day or night.  Always have the overnight bag lower than your bladder.  Do not let the bag touch the floor.  Before you go to sleep, put a clean plastic bag in a wastebasket. Then hang the overnight bag inside the wastebasket. You should also have a smaller leg bag that fits under your clothes.  Always wear the leg bag below your knee.  Do not wear your leg bag at night. How to care for your skin and catheter Supplies needed  A clean washcloth.  Water and mild soap.  A clean towel. Caring for your skin and catheter      Clean the skin around your catheter every day: 1. Wash your hands with soap and water. 2. Wet a clean washcloth in warm water and mild soap. 3. Clean the skin around your urethra.  If you are  male:  Gently spread the folds of skin around your vagina (labia).  With the washcloth in your other hand, wipe the inner side of your labia on each side. Wipe from front to back.  If you are male:  Pull back any skin that covers the end of your penis (foreskin).  With the washcloth in your other hand, wipe your penis in small circles. Start wiping at the tip of your penis, then move away from the catheter.  Move the foreskin back in place, if needed. 4. With your free hand, hold the catheter close to where it goes into your body.  Keep holding the catheter during cleaning so it does not get pulled out. 5. With the washcloth in your other hand, clean the catheter.  Only wipe downward on the catheter.  Do not wipe upward toward your body. Doing this may push germs into your urethra and cause infection. 6. Use a clean towel to pat-dry the catheter and the skin around it. Make sure to wipe off all soap. 7. Wash your hands with soap and water.  Shower every day. Do not take baths.  Do not use cream, ointment, or lotion on the area where the catheter goes into your body, unless your doctor tells you  to.  Do not use powders, sprays, or lotions on your genital area.  Check your skin around the catheter every day for signs of infection. Check for: ? Redness, swelling, or pain. ? Fluid or blood. ? Warmth. ? Pus or a bad smell. How to empty the bag Supplies needed  Rubbing alcohol.  Gauze pad or cotton ball.  Tape or a leg strap. Emptying the bag Pour the pee out of your bag when it is ?- full, or at least 2-3 times a day. Do this for your overnight bag and your leg bag. 1. Wash your hands with soap and water. 2. Separate (detach) the bag from your leg. 3. Hold the bag over the toilet or a clean pail. Keep the bag lower than your hips and bladder. This is so the pee (urine) does not go back into the tube. 4. Open the pour spout. It is at the bottom of the bag. 5. Empty the  pee into the toilet or pail. Do not let the pour spout touch any surface. 6. Put rubbing alcohol on a gauze pad or cotton ball. 7. Use the gauze pad or cotton ball to clean the pour spout. 8. Close the pour spout. 9. Attach the bag to your leg with tape or a leg strap. 10. Wash your hands with soap and water. Follow instructions for cleaning the drainage bag:  From the product maker.  As told by your doctor. How to change the bag Supplies needed  Alcohol wipes.  A clean bag.  Tape or a leg strap. Changing the bag Replace your bag when it starts to leak, smell bad, or look dirty. 1. Wash your hands with soap and water. 2. Separate the dirty bag from your leg. 3. Pinch the catheter with your fingers so that pee does not spill out. 4. Separate the catheter tube from the bag tube where these tubes connect (at the connection valve). Do not let the tubes touch any surface. 5. Clean the end of the catheter tube with an alcohol wipe. Use a different alcohol wipe to clean the end of the bag tube. 6. Connect the catheter tube to the tube of the clean bag. 7. Attach the clean bag to your leg with tape or a leg strap. Do not make the bag tight on your leg. 8. Wash your hands with soap and water. General rules   Never pull on your catheter. Never try to take it out. Doing that can hurt you.  Always wash your hands before and after you touch your catheter or bag. Use a mild, fragrance-free soap. If you do not have soap and water, use hand sanitizer.  Always make sure there are no twists or bends (kinks) in the catheter tube.  Always make sure there are no leaks in the catheter or bag.  Drink enough fluid to keep your pee pale yellow.  Do not take baths, swim, or use a hot tub.  If you are male, wipe from front to back after you poop (have a bowel movement). Contact a doctor if:  Your pee is cloudy.  Your pee smells worse than usual.  Your catheter gets clogged.  Your catheter  leaks.  Your bladder feels full. Get help right away if:  You have redness, swelling, or pain where the catheter goes into your body.  You have fluid, blood, pus, or a bad smell coming from the area where the catheter goes into your body.  Your skin feels warm where  the catheter goes into your body.  You have a fever.  You have pain in your: ? Belly (abdomen). ? Legs. ? Lower back. ? Bladder.  You see blood in the catheter.  Your pee is pink or red.  You feel sick to your stomach (nauseous).  You throw up (vomit).  You have chills.  Your pee is not draining into the bag.  Your catheter gets pulled out. Summary  An indwelling urinary catheter is a thin tube that is placed into the bladder to help drain pee (urine) out of the body.  The catheter is placed into the part of the body that drains pee from the bladder (urethra).  Taking good care of your catheter will keep it working properly and help prevent problems.  Always wash your hands before and after touching your catheter or bag.  Never pull on your catheter or try to take it out. This information is not intended to replace advice given to you by your health care provider. Make sure you discuss any questions you have with your health care provider. Document Revised: 01/12/2019 Document Reviewed: 05/06/2017 Elsevier Patient Education  [place].

## 2019-12-03 NOTE — Anesthesia Preprocedure Evaluation (Addendum)
Anesthesia Evaluation  Patient identified by MRN, date of birth, ID band Patient awake    Reviewed: Allergy & Precautions, NPO status , Patient's Chart, lab work & pertinent test results  Airway Mallampati: II  TM Distance: >3 FB Neck ROM: Full    Dental  (+) Edentulous Lower, Edentulous Upper   Pulmonary shortness of breath and with exertion, asthma , former smoker,     + wheezing (improved after duoneb treatment)      Cardiovascular Exercise Tolerance: Poor + DOE   Rhythm:Regular Rate:Normal     Neuro/Psych  Headaches, PSYCHIATRIC DISORDERS Depression Bipolar Disorder Schizophrenia    GI/Hepatic Neg liver ROS, GERD  Medicated and Controlled,  Endo/Other  negative endocrine ROS  Renal/GU negative Renal ROS  negative genitourinary   Musculoskeletal  (+) Arthritis , Osteoarthritis,    Abdominal   Peds  Hematology negative hematology ROS (+)   Anesthesia Other Findings   Reproductive/Obstetrics negative OB ROS                          Anesthesia Physical Anesthesia Plan  ASA: III  Anesthesia Plan: Spinal   Post-op Pain Management:    Induction: Intravenous  PONV Risk Score and Plan: 1 and Ondansetron and Midazolam  Airway Management Planned: Nasal Cannula, Natural Airway and Simple Face Mask  Additional Equipment:   Intra-op Plan:   Post-operative Plan: Possible Post-op intubation/ventilation  Informed Consent: I have reviewed the patients History and Physical, chart, labs and discussed the procedure including the risks, benefits and alternatives for the proposed anesthesia with the patient or authorized representative who has indicated his/her understanding and acceptance.       Plan Discussed with: CRNA  Anesthesia Plan Comments:        Anesthesia Quick Evaluation

## 2019-12-04 DIAGNOSIS — N401 Enlarged prostate with lower urinary tract symptoms: Secondary | ICD-10-CM | POA: Diagnosis not present

## 2019-12-04 LAB — BASIC METABOLIC PANEL
Anion gap: 9 (ref 5–15)
BUN: 8 mg/dL (ref 6–20)
CO2: 28 mmol/L (ref 22–32)
Calcium: 8.8 mg/dL — ABNORMAL LOW (ref 8.9–10.3)
Chloride: 106 mmol/L (ref 98–111)
Creatinine, Ser: 0.72 mg/dL (ref 0.61–1.24)
GFR calc Af Amer: >60 mL/min (ref >60)
GFR calc non Af Amer: >60 mL/min (ref >60)
Glucose, Bld: 100 mg/dL — ABNORMAL HIGH (ref 70–99)
Potassium: 4.7 mmol/L (ref 3.5–5.1)
Sodium: 143 mmol/L (ref 135–145)

## 2019-12-04 LAB — CBC
HCT: 42.3 % (ref 39.0–52.0)
Hemoglobin: 13.9 g/dL (ref 13.0–17.0)
MCH: 35.4 pg — ABNORMAL HIGH (ref 26.0–34.0)
MCHC: 32.9 g/dL (ref 30.0–36.0)
MCV: 107.6 fL — ABNORMAL HIGH (ref 80.0–100.0)
Platelets: 167 10*3/uL (ref 150–400)
RBC: 3.93 MIL/uL — ABNORMAL LOW (ref 4.22–5.81)
RDW: 14.9 % (ref 11.5–15.5)
WBC: 5.1 10*3/uL (ref 4.0–10.5)
nRBC: 0 % (ref 0.0–0.2)

## 2019-12-04 LAB — SURGICAL PATHOLOGY

## 2019-12-04 MED ORDER — OXYCODONE-ACETAMINOPHEN 5-325 MG PO TABS: 1.0000 | ORAL_TABLET | ORAL | 0 refills | Status: DC | PRN

## 2019-12-06 ENCOUNTER — Telehealth: Payer: Self-pay | Admitting: Urology

## 2019-12-06 ENCOUNTER — Other Ambulatory Visit: Payer: Self-pay | Admitting: Urology

## 2019-12-06 MED ORDER — OXYCODONE-ACETAMINOPHEN 5-325 MG PO TABS: 1.0000 | ORAL_TABLET | ORAL | 0 refills | Status: DC | PRN

## 2019-12-06 NOTE — Telephone Encounter (Signed)
Spoke with Dr. Alyson Ingles. Prescription resubmited

## 2019-12-06 NOTE — Telephone Encounter (Signed)
Adam with [employer] called again today regarding patients prescription for oxycodone.

## 2019-12-07 ENCOUNTER — Encounter: Payer: Self-pay | Admitting: Urology

## 2019-12-07 ENCOUNTER — Other Ambulatory Visit: Payer: Self-pay

## 2019-12-07 ENCOUNTER — Ambulatory Visit (INDEPENDENT_AMBULATORY_CARE_PROVIDER_SITE_OTHER): Payer: Medicaid Other | Admitting: Urology

## 2019-12-07 VITALS — BP 113/74 | HR 98 | Temp 96.8°F | Ht 66.0 in | Wt 220.0 lb

## 2019-12-07 DIAGNOSIS — N138 Other obstructive and reflux uropathy: Secondary | ICD-10-CM

## 2019-12-07 DIAGNOSIS — R339 Retention of urine, unspecified: Secondary | ICD-10-CM

## 2019-12-07 DIAGNOSIS — N401 Enlarged prostate with lower urinary tract symptoms: Secondary | ICD-10-CM

## 2019-12-07 NOTE — Progress Notes (Signed)
Fill and Pull Catheter Removal  Patient is present today for a catheter removal.  Patient was cleaned and prepped in a sterile fashion 51m of sterile water/ saline was instilled into the bladder when the patient felt the urge to urinate. 250mof water was then drained from the balloon.  A 22FR foley cath was removed from the bladder no complications were noted .  Patient as then given some time to void on their own.  Patient can void  1976mn their own after some time.  Patient tolerated well.  Performed by: dmerchant, lpn  Follow up/ Additional notes: [name]

## 2019-12-07 NOTE — Progress Notes (Signed)
Urological Symptom Review  Patient is experiencing the following symptoms: None   Review of Systems  Gastrointestinal (upper)  : Negative for upper GI symptoms  Gastrointestinal (lower) : Negative for lower GI symptoms  Constitutional : Negative for symptoms  Skin: Negative for skin symptoms  Eyes: Negative for eye symptoms  Ear/Nose/Throat : Negative for Ear/Nose/Throat symptoms  Hematologic/Lymphatic: Negative for Hematologic/Lymphatic symptoms  Cardiovascular : Negative for cardiovascular symptoms  Respiratory : Negative for respiratory symptoms  Endocrine: Negative for endocrine symptoms  Musculoskeletal: Negative for musculoskeletal symptoms  Neurological: Negative for neurological symptoms  Psychologic: Negative for psychiatric symptoms

## 2019-12-10 ENCOUNTER — Encounter: Payer: Self-pay | Admitting: Urology

## 2019-12-10 NOTE — Progress Notes (Signed)
12/07/2019 10:14 AM   [name] [birth year] [id number]  Referring provider: [name], MD [address] [place],  [place] [address]  S/p TURP  HPI: Mr Deeb is a 54yo here for voiding trial after TURP. Urine clear. No pain   PMH: Past Medical History:  Diagnosis Date  . Arthritis   . Asthma   . 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 Bipolar disorder ([place])   . Colostomy in place [facility])   . Crohn's colitis ([place])    s/p total colectomy with ileostomy in 2009  . Crohn's disease of small intestine [facility]) Sept 2012   ileoscopy: multiple ulcers likely secondary to Crohn's  . DEEP VENOUS THROMBOPHLEBITIS, HX OF 02/11/2009   Qualifier: Diagnosis of  By: Zeb Comfort    . Depression   . DVT (deep venous thrombosis) ([place]) 2009   right upper extremity due to PICC  . Enterococcus UTI 2009  . GERD (gastroesophageal reflux disease)   . Hernia   . Hyperlipidemia   . Insomnia   . Migraine headache   . Nystagmus   . PULMONARY EMBOLISM, HX OF 02/11/2009   Qualifier: Diagnosis of  By: Zeb Comfort    . S/P endoscopy Sept 2012   mild gastritis, small hiatal hernia, no ulcers  . Schizophrenia ([place])   . Schizophrenia, acute [facility])     Surgical History: Past Surgical History:  Procedure Laterality Date  . APPENDECTOMY    . ESOPHAGOGASTRODUODENOSCOPY  07/2010   gastritis,  bx neg for H.Pylori  . ESOPHAGOGASTRODUODENOSCOPY N/A 01/08/2014   SLF:NO SOURCE FOR ODYNOPHAGIA IDENTIFIED/Multiple small ulcers in the gastric antrum  . EYE SURGERY    . ILEOSCOPY  06/29/2011   SLF: ulcers,multiple/small HH/mild gastritis  . KIDNEY SURGERY    . LAPAROTOMY N/A 06/05/2013   Procedure: EXPLORATORY LAPAROTOMY;  Surgeon: Donato Heinz, MD;  Location: [facility];  Service: General;  Laterality: N/A;  . LYSIS OF ADHESION N/A 06/05/2013   Procedure: LYSIS OF ADHESIONS;  Surgeon: Donato Heinz, MD;  Location: [facility];  Service: General;   Laterality: N/A;  . small bowel capsule study  08/2010   few tiny nonbleeding erosions/ulcers ?secondary to relafen or Crohn's. Entire small bowel not seen.   Marland Kitchen TOTAL COLECTOMY  2009   with ileostomy at [facility] for refractory disease   . TOTAL HIP ARTHROPLASTY     right, due to avascular necrosis from chronic prednisone  . TOTAL SHOULDER REPLACEMENT     bilateral  . TRANSURETHRAL RESECTION OF PROSTATE N/A 12/03/2019   Procedure: TRANSURETHRAL RESECTION OF THE PROSTATE (TURP);  Surgeon: Cleon Gustin, MD;  Location: [facility];  Service: Urology;  Laterality: N/A;    Home Medications:  Allergies as of 12/07/2019      Reactions   Aspirin    REACTION: unknown reaction   Bactrim [sulfamethoxazole-trimethoprim]    ABD CRAMPS AND DIARRHEA   Codeine    REACTION: unknown reaction   Ibuprofen    REACTION: unknown reaction   Penicillins    REACTION: unknown reaction   Remicade [infliximab]    COULDN'T BREATHE   Tramadol Hcl    REACTION: unknown reaction   Vicodin [hydrocodone-acetaminophen] Nausea Only      Medication List       Accurate as of December 07, 2019 11:59 PM. If you have any questions, ask your nurse or doctor.        acetaminophen 500  MG tablet Commonly known as: TYLENOL Take 1,000 mg by mouth every 6 (six) hours as needed. For pain   atorvastatin 80 MG tablet Commonly known as: LIPITOR Take 80 mg by mouth daily.   B-12 1000 MCG Caps Take by mouth daily.   benztropine 0.5 MG tablet Commonly known as: COGENTIN Take 0.5 mg by mouth daily.   Calcium 600/Vitamin D3 600-800 MG-UNIT Tabs Generic drug: Calcium Carb-Cholecalciferol TAKE (1) TABLET BY MOUTH THREE TIMES DAILY AFTER MEALS.   calcium-vitamin D 500-200 MG-UNIT Tabs tablet Commonly known as: OSCAL WITH D Take 1 tablet by mouth 3 (three) times daily.   dicyclomine 10 MG capsule Commonly known as: BENTYL TAKE 1 OR 2 CAPSULE BY MOUTH AS DIRECTED BEFORE MEALS AND AT BEDTIME.   divalproex 500 MG 24 hr  tablet Commonly known as: DEPAKOTE ER Take 1,000 mg by mouth every evening.   divalproex 250 MG 24 hr tablet Commonly known as: DEPAKOTE ER Take 250 mg by mouth daily.   docusate sodium 100 MG capsule Commonly known as: COLACE Take 1 capsule (100 mg total) by mouth 2 (two) times daily.   folic acid 1 MG tablet Commonly known as: FOLVITE Take 1 mg by mouth daily.   furosemide 40 MG tablet Commonly known as: LASIX Take 40 mg by mouth daily.   gabapentin 300 MG capsule Commonly known as: NEURONTIN Take 900 mg by mouth 3 (three) times daily.   lidocaine 2 % solution Commonly known as: XYLOCAINE 2 TSP  PO Q4-6H PRN FOR HEARTBURN OR UPPER ABDOMINAL PAIN   meloxicam 7.5 MG tablet Commonly known as: MOBIC Take 7.5 mg by mouth daily.   mercaptopurine 50 MG tablet Commonly known as: PURINETHOL TAKE 2 TABLETS DAILY ON EMPTY STOMACH 1 HOUR BEFORE OR 2 HOURS AFTER MEAL   metFORMIN 500 MG tablet Commonly known as: GLUCOPHAGE Take by mouth 2 (two) times daily with a meal.   mirtazapine 15 MG tablet Commonly known as: REMERON Take 15 mg by mouth at bedtime.   oxyCODONE-acetaminophen 5-325 MG tablet Commonly known as: Percocet Take 1 tablet by mouth every 4 (four) hours as needed for moderate pain or severe pain.   pantoprazole 40 MG tablet Commonly known as: PROTONIX TAKE (1) TABLET BY MOUTH TWICE A DAY BEFORE MEALS. (BREAKFAST AND SUPPER)   potassium chloride SA 20 MEQ tablet Commonly known as: KLOR-CON Take 20 mEq by mouth daily.   albuterol (2.5 MG/3ML) 0.083% nebulizer solution Commonly known as: PROVENTIL Take 2.5 mg by nebulization every 6 (six) hours as needed for wheezing or shortness of breath.   ProAir HFA 108 (90 Base) MCG/ACT inhaler Generic drug: albuterol Inhale 2 puffs into the lungs every 6 (six) hours as needed for wheezing or shortness of breath.   risperiDONE 2 MG tablet Commonly known as: RISPERDAL Take 3 mg by mouth at bedtime. 2 tabs at  bedtime   risperiDONE 1 MG tablet Commonly known as: RISPERDAL Take 1 tablet by mouth at bedtime.   sertraline 50 MG tablet Commonly known as: ZOLOFT Take 50 mg by mouth daily.   traZODone 50 MG tablet Commonly known as: DESYREL Take 50 mg by mouth at bedtime.   Walker Wheels Misc USE AS DIRECTED       Allergies:  Allergies  Allergen Reactions  . Aspirin     REACTION: unknown reaction  . Bactrim [Sulfamethoxazole-Trimethoprim]     ABD CRAMPS AND DIARRHEA  . Codeine     REACTION: unknown reaction  . Ibuprofen  REACTION: unknown reaction  . Penicillins     REACTION: unknown reaction  . Remicade [Infliximab]     COULDN'T BREATHE  . Tramadol Hcl     REACTION: unknown reaction  . Vicodin [Hydrocodone-Acetaminophen] Nausea Only    Family History: Family History  Problem Relation Age of Onset  . Diabetes Mother   . Heart disease Mother   . Diabetes Father   . Heart disease Father     Social History:  reports that he has quit smoking. His smoking use included cigarettes. He smoked 1.00 pack per day. He has never used smokeless tobacco. He reports that he does not drink alcohol or use drugs.  ROS: All other review of systems were reviewed and are negative except what is noted above in HPI  Physical Exam: BP 113/74   Pulse 98   Temp (!) 96.8 F (36 C)   Ht 5' 6"  (1.676 m)   Wt 220 lb (99.8 kg)   BMI 35.51 kg/m   Constitutional:  Alert and oriented, No acute distress. HEENT: [place] AT, moist mucus membranes.  Trachea midline, no masses. Cardiovascular: No clubbing, cyanosis, or edema. Respiratory: Normal respiratory effort, no increased work of breathing. GI: Abdomen is soft, nontender, nondistended, no abdominal masses GU: No CVA tenderness Lymph: No cervical or inguinal lymphadenopathy. Skin: No rashes, bruises or suspicious lesions. Neurologic: Grossly intact, no focal deficits, moving all 4 extremities. Psychiatric: Normal mood and affect.  Laboratory  Data: Lab Results  Component Value Date   WBC 5.1 12/04/2019   HGB 13.9 12/04/2019   HCT 42.3 12/04/2019   MCV 107.6 (H) 12/04/2019   PLT 167 12/04/2019    Lab Results  Component Value Date   CREATININE 0.72 12/04/2019    No results found for: PSA  No results found for: TESTOSTERONE  Lab Results  Component Value Date   HGBA1C 6.1 (H) 11/30/2019    Urinalysis    Component Value Date/Time   COLORURINE YELLOW 08/07/2019 1419   APPEARANCEUR HAZY (A) 08/07/2019 1419   LABSPEC <1.005 (L) 08/07/2019 1419   PHURINE >9.0 (H) 08/07/2019 1419   GLUCOSEU NEGATIVE 08/07/2019 1419   HGBUR SMALL (A) 08/07/2019 1419   BILIRUBINUR NEGATIVE 08/07/2019 1419   KETONESUR NEGATIVE 08/07/2019 1419   PROTEINUR >300 (A) 08/07/2019 1419   UROBILINOGEN 0.2 10/17/2014 1940   NITRITE POSITIVE (A) 08/07/2019 1419   LEUKOCYTESUR MODERATE (A) 08/07/2019 1419    Lab Results  Component Value Date   BACTERIA MANY (A) 08/07/2019    Pertinent Imaging:  No results found for this or any previous visit. No results found for this or any previous visit. No results found for this or any previous visit. No results found for this or any previous visit. Results for orders placed during the hospital encounter of 04/27/16  US Renal   Narrative CLINICAL DATA:  Bladder neck alpha obstruction. Recurrent UTIs. History of Crohn's disease. Colostomy. EXAM: RENAL / URINARY TRACT ULTRASOUND COMPLETE COMPARISON:  CT 03/05/2014 . FINDINGS: Right Kidney: Length: 12.6 cm. Echogenicity within normal limits. No mass or hydronephrosis visualized. Left Kidney: Length: 11.9 cm. Echogenicity within normal limits. No mass or hydronephrosis visualized. Bladder: Appears normal for degree of bladder distention. Bilateral ureteral jets noted. IMPRESSION: No acute or focal abnormality. Electronically Signed   By: Marcello Moores  Register   On: 04/27/2016 13:37   No results found for this or any previous visit. No  results found for this or any previous visit. No results found for this or any  previous visit.  Assessment & Plan:    1. Benign prostatic hyperplasia with urinary obstruction -voiding trial passed today. RTC 1 week with PVR  2. Incomplete emptying of bladder -Double voiding. RTC 1 week with PVR   Return in about 1 week (around 12/14/2019) for Nurse visit voiding trial.  [name], MD  [facility] [place]

## 2019-12-10 NOTE — Patient Instructions (Signed)
Transurethral Resection of the Prostate, Care After This sheet gives you information about how to care for yourself after your procedure. Your health care provider may also give you more specific instructions. If you have problems or questions, contact your health care provider. What can I expect after the procedure? After the procedure, it is common to have:  Mild pain in your lower abdomen.  Soreness or mild discomfort in your penis from having the catheter inserted during the procedure.  A feeling of urgency when you need to urinate.  A small amount of blood in your urine. You may notice some small blood clots in your urine. These are normal. Follow these instructions at home: Medicines  Take over-the-counter and prescription medicines only as told by your health care provider.  If you were prescribed an antibiotic medicine, take it as told by your health care provider. Do not stop taking the antibiotic even if you start to feel better.  Ask your health care provider if the medicine prescribed to you: ? Requires you to avoid driving or using heavy machinery. ? Can cause constipation. You may need to take actions to prevent or treat constipation, such as:  Take over-the-counter or prescription medicines.  Eat foods that are high in fiber, such as fresh fruits and vegetables, whole grains, and beans.  Limit foods that are high in fat and processed sugars, such as fried or sweet foods.  Do not drive for 24 hours if you were given a sedative during your procedure. Activity   Return to your normal activities as told by your health care provider. Ask your health care provider what activities are safe for you.  Do not lift anything that is heavier than 10 lb (4.5 kg), or the limit that you are told, for 3 weeks after the procedure or until your health care provider says that it is safe.  Avoid intense physical activity for as long as told by your health care provider.  Avoid  sitting for a long time without moving. Get up and move around one or more times every few hours. This helps to prevent blood clots. You may increase your physical activity gradually as you start to feel better. Lifestyle  Do not drink alcohol for as long as told by your health care provider. This is especially important if you are taking prescription pain medicines.  Do not engage in sexual activity until your health care provider says that you can do this. General instructions   Do not take baths, swim, or use a hot tub until your health care provider approves.  Drink enough fluid to keep your urine pale yellow.  Urinate as soon as you feel the need to. Do not try to hold your urine for long periods of time.  If your health care provider approves, you may take a stool softener for 2-3 weeks to prevent you from straining to have a bowel movement.  Wear compression stockings as told by your health care provider. These stockings help to prevent blood clots and reduce swelling in your legs.  Keep all follow-up visits as told by your health care provider. This is important. Contact a health care provider if you have:  Difficulty urinating.  A fever.  Pain that gets worse or does not improve with medicine.  Blood in your urine that does not go away after 1 week of resting and drinking more fluids.  Swelling in your penis or testicles. Get help right away if:  You are unable  to urinate.  You are having more blood clots in your urine instead of fewer.  You have: ? Large blood clots. ? A lot of blood in your urine. ? Pain in your back or lower abdomen. ? Pain or swelling in your legs. ? Chills and you are shaking. ? Difficulty breathing or shortness of breath. Summary  After the procedure, it is common to have a small amount of blood in your urine.  Avoid heavy lifting and intense physical activity for as long as told by your health care provider.  Urinate as soon as you  feel the need to. Do not try to hold your urine for long periods of time.  Keep all follow-up visits as told by your health care provider. This is important. This information is not intended to replace advice given to you by your health care provider. Make sure you discuss any questions you have with your health care provider. Document Revised: 01/10/2019 Document Reviewed: 06/21/2018 Elsevier Patient Education  [place].

## 2019-12-12 ENCOUNTER — Other Ambulatory Visit: Payer: Self-pay

## 2019-12-12 ENCOUNTER — Ambulatory Visit: Payer: Medicaid Other

## 2019-12-12 ENCOUNTER — Ambulatory Visit: Payer: Medicaid Other | Admitting: Urology

## 2019-12-12 VITALS — Temp 98.1°F

## 2019-12-12 DIAGNOSIS — N400 Enlarged prostate without lower urinary tract symptoms: Secondary | ICD-10-CM

## 2019-12-12 LAB — BLADDER SCAN AMB NON-IMAGING: Scan Result: 252

## 2019-12-12 NOTE — Discharge Summary (Signed)
Patient ID: [name] MRN: [id number] DOB/AGE: 03/09/1966 54 y.o.  Admit date: 12/03/2019 Discharge date:3.2.2021  Primary Care Physician:  [name], MD  Discharge Diagnoses:  BPH w/ obstruction Present on Admission: . BPH (benign prostatic hyperplasia)      Discharge Medications: Allergies as of 12/04/2019      Reactions   Aspirin    REACTION: unknown reaction   Bactrim [sulfamethoxazole-trimethoprim]    ABD CRAMPS AND DIARRHEA   Codeine    REACTION: unknown reaction   Ibuprofen    REACTION: unknown reaction   Penicillins    REACTION: unknown reaction   Remicade [infliximab]    COULDN'T BREATHE   Tramadol Hcl    REACTION: unknown reaction   Vicodin [hydrocodone-acetaminophen] Nausea Only      Medication List    STOP taking these medications   tamsulosin 0.4 MG Caps capsule Commonly known as: FLOMAX     TAKE these medications   acetaminophen 500 MG tablet Commonly known as: TYLENOL Take 1,000 mg by mouth every 6 (six) hours as needed. For pain   atorvastatin 80 MG tablet Commonly known as: LIPITOR Take 80 mg by mouth daily.   B-12 1000 MCG Caps Take by mouth daily.   benztropine 0.5 MG tablet Commonly known as: COGENTIN Take 0.5 mg by mouth daily.   Calcium 600/Vitamin D3 600-800 MG-UNIT Tabs Generic drug: Calcium Carb-Cholecalciferol TAKE (1) TABLET BY MOUTH THREE TIMES DAILY AFTER MEALS.   calcium-vitamin D 500-200 MG-UNIT Tabs tablet Commonly known as: OSCAL WITH D Take 1 tablet by mouth 3 (three) times daily.   dicyclomine 10 MG capsule Commonly known as: BENTYL TAKE 1 OR 2 CAPSULE BY MOUTH AS DIRECTED BEFORE MEALS AND AT BEDTIME.   divalproex 500 MG 24 hr tablet Commonly known as: DEPAKOTE ER Take 1,000 mg by mouth every evening.   divalproex 250 MG 24 hr tablet Commonly known as: DEPAKOTE ER Take 250 mg by mouth daily.   docusate sodium 100 MG capsule Commonly known as: COLACE Take 1 capsule (100 mg total) by mouth 2 (two)  times daily.   folic acid 1 MG tablet Commonly known as: FOLVITE Take 1 mg by mouth daily.   furosemide 40 MG tablet Commonly known as: LASIX Take 40 mg by mouth daily.   gabapentin 300 MG capsule Commonly known as: NEURONTIN Take 900 mg by mouth 3 (three) times daily.   lidocaine 2 % solution Commonly known as: XYLOCAINE 2 TSP  PO Q4-6H PRN FOR HEARTBURN OR UPPER ABDOMINAL PAIN   meloxicam 7.5 MG tablet Commonly known as: MOBIC Take 7.5 mg by mouth daily.   mercaptopurine 50 MG tablet Commonly known as: PURINETHOL TAKE 2 TABLETS DAILY ON EMPTY STOMACH 1 HOUR BEFORE OR 2 HOURS AFTER MEAL   metFORMIN 500 MG tablet Commonly known as: GLUCOPHAGE Take by mouth 2 (two) times daily with a meal.   mirtazapine 15 MG tablet Commonly known as: REMERON Take 15 mg by mouth at bedtime.   pantoprazole 40 MG tablet Commonly known as: PROTONIX TAKE (1) TABLET BY MOUTH TWICE A DAY BEFORE MEALS. (BREAKFAST AND SUPPER)   potassium chloride SA 20 MEQ tablet Commonly known as: KLOR-CON Take 20 mEq by mouth daily.   albuterol (2.5 MG/3ML) 0.083% nebulizer solution Commonly known as: PROVENTIL Take 2.5 mg by nebulization every 6 (six) hours as needed for wheezing or shortness of breath.   ProAir HFA 108 (90 Base) MCG/ACT inhaler Generic drug: albuterol Inhale 2 puffs into the lungs every 6 (  six) hours as needed for wheezing or shortness of breath.   risperiDONE 2 MG tablet Commonly known as: RISPERDAL Take 3 mg by mouth at bedtime. 2 tabs at bedtime   risperiDONE 1 MG tablet Commonly known as: RISPERDAL Take 1 tablet by mouth at bedtime.   sertraline 50 MG tablet Commonly known as: ZOLOFT Take 50 mg by mouth daily.   traZODone 50 MG tablet Commonly known as: DESYREL Take 50 mg by mouth at bedtime.   Walker Wheels Misc USE AS DIRECTED        Significant Diagnostic Studies:  No results found.  Brief H and P: For complete details please refer to admission H and P,  but in brief 54 yo male presents for TURP.  Hospital Course: No postop issues, pt discharged on POD 1 w/ foley Active Problems:   BPH (benign prostatic hyperplasia)   Day of Discharge BP 114/69 (BP Location: Left Arm)   Pulse 72   Temp 98.6 F (37 C) (Oral)   Resp 17   Ht 5' 6"  (1.676 m)   Wt 99.8 kg   SpO2 96%   BMI 35.51 kg/m   No results found for this or any previous visit (from the past 24 hour(s)).  Physical Exam: General: Alert and awake oriented x3 not in any acute distress. HEENT: anicteric sclera, pupils reactive to light and accommodation CVS: S1-S2 clear no murmur rubs or gallops Chest: clear to auscultation bilaterally, no wheezing rales or rhonchi Abdomen: soft nontender, nondistended, normal bowel sounds, no organomegaly Extremities: no cyanosis, clubbing or edema noted bilaterally Neuro: Cranial nerves II-XII intact, no focal neurological deficits  Disposition:  Home  Diet:  Regular Activity:  Restrictions discussed   Disposition and Follow-up:  Discharge Instructions    Care order/instruction   Complete by: As directed    D/c with catheter as well as bedside and leg bags Teach how to manage bags   Diet general   Complete by: As directed    Discharge patient   Complete by: As directed    Discharge disposition: 01-Home or Self Care   Discharge patient date: 12/03/2019   Increase activity slowly   Complete by: As directed        TESTS THAT NEED FOLLOW-UP  Path review  DISCHARGE FOLLOW-UP  Follow-up Information    Cleon Gustin, MD. Call in 1 week.   Specialty: Urology Contact information: [address] [place] [address] [phone]        Cleon Gustin, MD. Go to.   Specialty: Urology Why: Go to the office for Foley Removal at 9:30 am Contact information: [place] [address] [phone]           Time spent on Discharge:     Signed: Jorja Loa 12/12/2019, 8:21  PM

## 2020-01-18 ENCOUNTER — Ambulatory Visit: Payer: Medicaid Other

## 2020-01-22 ENCOUNTER — Ambulatory Visit (INDEPENDENT_AMBULATORY_CARE_PROVIDER_SITE_OTHER): Payer: Medicaid Other

## 2020-01-22 ENCOUNTER — Other Ambulatory Visit: Payer: Self-pay

## 2020-01-22 VITALS — Temp 98.1°F

## 2020-01-22 DIAGNOSIS — R339 Retention of urine, unspecified: Secondary | ICD-10-CM

## 2020-01-22 LAB — BLADDER SCAN AMB NON-IMAGING: Scan Result: 26.3

## 2020-01-24 ENCOUNTER — Ambulatory Visit: Payer: Medicaid Other | Admitting: Gastroenterology

## 2020-01-29 ENCOUNTER — Telehealth: Payer: Self-pay | Admitting: Gastroenterology

## 2020-01-29 NOTE — Telephone Encounter (Signed)
Called verified name and dob notified pt that he could have his rx sent to another pharmacy pt stated he was not sure who would fill the rx for his supplies. Notified him of [employer] [facility], [facility], and [facility]. Pt stated he did not have anything thing to write with but would call back or come by to get the number. Thanked me for the call

## 2020-01-29 NOTE — Telephone Encounter (Signed)
[phone]  Please call patient, he is going to have to find another company to deliver his under pads and colostomy bags.  The one he currently uses is going out of business

## 2020-02-06 ENCOUNTER — Other Ambulatory Visit: Payer: Self-pay

## 2020-02-06 ENCOUNTER — Encounter: Payer: Self-pay | Admitting: *Deleted

## 2020-02-06 ENCOUNTER — Encounter: Payer: Self-pay | Admitting: Gastroenterology

## 2020-02-06 ENCOUNTER — Ambulatory Visit (INDEPENDENT_AMBULATORY_CARE_PROVIDER_SITE_OTHER): Payer: Medicaid Other | Admitting: Gastroenterology

## 2020-02-06 DIAGNOSIS — Z9189 Other specified personal risk factors, not elsewhere classified: Secondary | ICD-10-CM | POA: Diagnosis not present

## 2020-02-06 DIAGNOSIS — K50819 Crohn's disease of both small and large intestine with unspecified complications: Secondary | ICD-10-CM

## 2020-02-06 MED ORDER — MERCAPTOPURINE 50 MG PO TABS: ORAL_TABLET | ORAL | 11 refills | Status: DC

## 2020-02-06 NOTE — Assessment & Plan Note (Signed)
LAST DEX 2017. PRIOR HISTORY OF PROLONGED EXPOSURE TO STEROIDS.  DEXA IN 2021. FOLLOW UP IN 6 MOS.

## 2020-02-06 NOTE — Patient Instructions (Addendum)
CONTINUE MERCAPTOPURINE.  COMPLETE DEXA SCAN.  I WILL CHECK LABS FROM DR. KIKEL.  FOLLOW UP IN 6 MOS WITH ERIC GILL/DR. CARVER.

## 2020-02-06 NOTE — Progress Notes (Signed)
Subjective:    Patient ID: [name], male    DOB: July 30, 1966, 54 y.o.   MRN: [id number]  [name], MD  HPI Now has younger girlfriend AGE 48. NOW HAS TO WALK WITH WALKER DUE TO FALLING FROM ARTHRITIS. SAW NEUROLOGY AND NO NERVE DAMAGE. EATING HEALTHIER. BMs: EMPTIES BAG: 4X/DAY, SOLID STOOL UNLESS HE DRINKS COFFEE BEFORE HE EATS. COVID SHOT x 2(?). LAST DIARRHEA: 1X/WEEK. KNEES HURT HIM THE MOST. HAS BACK PAIN SO MUCH THAT HE CAN'T GET OUT OF BED. RECENT LABS WITH DR. Bartolo Darter.HAD UROLOGY SURGERY FOR BLOCKAGE.  PT DENIES FEVER, CHILLS, HEMATOCHEZIA, HEMATEMESIS, nausea, vomiting, melena, ODYNOPHAGIA, SORES IN MOUTH, RASH ON LEGS, CHEST PAIN, SHORTNESS OF BREATH,  CHANGE IN BOWEL IN HABITS, constipation, abdominal pain, problems swallowing, OR heartburn or indigestion.  Past Medical History:  Diagnosis Date  . Arthritis   . Asthma   . 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 Bipolar disorder ([place])   . Colostomy in place [facility])   . Crohn's colitis ([place])    s/p total colectomy with ileostomy in 2009  . Crohn's disease of small intestine [facility]) Sept 2012   ileoscopy: multiple ulcers likely secondary to Crohn's  . DEEP VENOUS THROMBOPHLEBITIS, HX OF 02/11/2009   Qualifier: Diagnosis of  By: Zeb Comfort    . Depression   . DVT (deep venous thrombosis) ([place]) 2009   right upper extremity due to PICC  . Enterococcus UTI 2009  . GERD (gastroesophageal reflux disease)   . Hernia   . Hyperlipidemia   . Insomnia   . Migraine headache   . Nystagmus   . PULMONARY EMBOLISM, HX OF 02/11/2009   Qualifier: Diagnosis of  By: Zeb Comfort    . S/P endoscopy Sept 2012   mild gastritis, small hiatal hernia, no ulcers  . Schizophrenia ([place])   . Schizophrenia, acute [facility])     Past Surgical History:  Procedure Laterality Date  . APPENDECTOMY    . ESOPHAGOGASTRODUODENOSCOPY  07/2010   gastritis,  bx neg for H.Pylori  .  ESOPHAGOGASTRODUODENOSCOPY N/A 01/08/2014   SLF:NO SOURCE FOR ODYNOPHAGIA IDENTIFIED/Multiple small ulcers in the gastric antrum  . EYE SURGERY    . ILEOSCOPY  06/29/2011   SLF: ulcers,multiple/small HH/mild gastritis  . KIDNEY SURGERY    . LAPAROTOMY N/A 06/05/2013   Procedure: EXPLORATORY LAPAROTOMY;  Surgeon: Donato Heinz, MD;  Location: [facility];  Service: General;  Laterality: N/A;  . LYSIS OF ADHESION N/A 06/05/2013   Procedure: LYSIS OF ADHESIONS;  Surgeon: Donato Heinz, MD;  Location: [facility];  Service: General;  Laterality: N/A;  . small bowel capsule study  08/2010   few tiny nonbleeding erosions/ulcers ?secondary to relafen or Crohn's. Entire small bowel not seen.   Marland Kitchen TOTAL COLECTOMY  2009   with ileostomy at [facility] for refractory disease   . TOTAL HIP ARTHROPLASTY     right, due to avascular necrosis from chronic prednisone  . TOTAL SHOULDER REPLACEMENT     bilateral  . TRANSURETHRAL RESECTION OF PROSTATE N/A 12/03/2019   Procedure: TRANSURETHRAL RESECTION OF THE PROSTATE (TURP);  Surgeon: Cleon Gustin, MD;  Location: [facility];  Service: Urology;  Laterality: N/A;    Allergies  Allergen Reactions  . Aspirin     REACTION: unknown reaction  . Bactrim [Sulfamethoxazole-Trimethoprim]     ABD CRAMPS AND DIARRHEA  . Codeine     REACTION: unknown reaction  . Ibuprofen  REACTION: unknown reaction  . Penicillins     REACTION: unknown reaction  . Remicade [Infliximab]     COULDN'T BREATHE  . Tramadol Hcl     REACTION: unknown reaction  . Vicodin [Hydrocodone-Acetaminophen] Nausea Only    Current Outpatient Medications  Medication Sig    . acetaminophen (TYLENOL) 500 MG tablet Take 1,000 mg by mouth every 6 (six) hours as needed. For pain     . albuterol (PROAIR HFA) 108 (90 BASE) MCG/ACT inhaler Inhale 2 puffs into the lungs every 6 (six) hours as needed for wheezing or shortness of breath.    Marland Kitchen albuterol (PROVENTIL) (2.5 MG/3ML) 0.083% nebulizer solution Take 2.5 mg  by nebulization every 6 (six) hours as needed for wheezing or shortness of breath.    Marland Kitchen atorvastatin (LIPITOR) 80 MG tablet Take 80 mg by mouth daily.    . benztropine (COGENTIN) 0.5 MG tablet Take 0.5 mg by mouth daily.     Marland Kitchen CALCIUM 600/VITAMIN D3 600-800 MG-UNIT TABS TAKE (1) TABLET BY MOUTH THREE TIMES DAILY AFTER MEALS.    . calcium-vitamin D (OSCAL WITH D) 500-200 MG-UNIT TABS tablet Take 1 tablet by mouth 3 (three) times daily.    . Cyanocobalamin (B-12) 1000 MCG CAPS Take by mouth daily.    Marland Kitchen dicyclomine (BENTYL) 10 MG capsule TAKE 1 OR 2 CAPSULE BID USUALLY    . divalproex (DEPAKOTE ER) 250 MG 24 hr tablet Take 250 mg by mouth daily.     . divalproex (DEPAKOTE) 500 MG 24 hr tablet Take 1,000 mg by mouth every evening.     . folic acid (FOLVITE) 1 MG tablet Take 1 mg by mouth daily.      . furosemide (LASIX) 40 MG tablet Take 40 mg by mouth daily.      Marland Kitchen gabapentin (NEURONTIN) 300 MG capsule Take 900 mg by mouth 3 (three) times daily.     . meloxicam (MOBIC) 7.5 MG tablet Take 7.5 mg by mouth daily.    . metFORMIN (GLUCOPHAGE) 500 MG tablet Take by mouth 2 (two) times daily with a meal.    . mirtazapine (REMERON) 15 MG tablet Take 15 mg by mouth at bedtime.    . Misc. Devices ([place]) MISC USE AS DIRECTED    . oxyCODONE-acetaminophen (PERCOCET) 5-325 MG tablet Take 1 tablet by mouth every 4 (four) hours as needed for moderate pain or severe pain.    . pantoprazole (PROTONIX) 40 MG tablet TAKE (1) TABLET BY MOUTH TWICE A DAY BEFORE MEALS. (BREAKFAST AND SUPPER)    . potassium chloride SA (K-DUR,KLOR-CON) 20 MEQ tablet Take 20 mEq by mouth daily.     . risperiDONE (RISPERDAL) 1 MG tablet Take 1 tablet by mouth at bedtime.    . risperiDONE (RISPERDAL) 2 MG tablet Take 2 mg by mouth at bedtime.     . sertraline (ZOLOFT) 50 MG tablet Take 50 mg by mouth daily.    . traZODone (DESYREL) 50 MG tablet Take 50 mg by mouth at bedtime.    .      . MERCAPTOPURINE 100 MG DAILY    .         Review of Systems PER HPI OTHERWISE ALL SYSTEMS ARE NEGATIVE.    Objective:   Physical Exam Constitutional:      General: He is not in acute distress.    Appearance: Normal appearance.  HENT:     Mouth/Throat:     Comments: MASK IN PLACE Eyes:  General: No scleral icterus.    Pupils: Pupils are equal, round, and reactive to light.  Cardiovascular:     Rate and Rhythm: Normal rate and regular rhythm.     Pulses: Normal pulses.     Heart sounds: Normal heart sounds.  Pulmonary:     Effort: Pulmonary effort is normal.     Breath sounds: Normal breath sounds.  Abdominal:     General: Bowel sounds are normal.     Palpations: Abdomen is soft.     Tenderness: There is no abdominal tenderness.  Musculoskeletal:     Cervical back: Normal range of motion.     Right lower leg: No edema.     Left lower leg: No edema.     Comments: WALKS ASSISTED WITH A WALKER  Lymphadenopathy:     Cervical: No cervical adenopathy.  Skin:    General: Skin is warm and dry.  Neurological:     Mental Status: He is alert and oriented to person, place, and time.     Comments: NO  NEW FOCAL DEFICITS, NYSTAGMUS  Psychiatric:        Mood and Affect: Mood normal.     Comments: NORMAL AFFECT       Assessment & Plan:

## 2020-02-06 NOTE — Assessment & Plan Note (Signed)
SYMPTOMS CONTROLLED/RESOLVED.  CONTINUE MERCAPTOPURINE. I WILL CHECK LABS FROM DR. KIKEL. FOLLOW UP IN 6 MOS WITH ERIC GILL/DR. CARVER.

## 2020-02-11 ENCOUNTER — Telehealth: Payer: Self-pay | Admitting: Gastroenterology

## 2020-02-11 ENCOUNTER — Other Ambulatory Visit: Payer: Self-pay

## 2020-02-11 ENCOUNTER — Ambulatory Visit (HOSPITAL_COMMUNITY)
Admission: RE | Admit: 2020-02-11 | Discharge: 2020-02-11 | Disposition: A | Discharge status: Home / Self Care | Payer: Medicaid Other | Source: Ambulatory Visit | Attending: Gastroenterology | Admitting: Gastroenterology

## 2020-02-11 DIAGNOSIS — Z9189 Other specified personal risk factors, not elsewhere classified: Secondary | ICD-10-CM

## 2020-02-11 DIAGNOSIS — K50819 Crohn's disease of both small and large intestine with unspecified complications: Secondary | ICD-10-CM | POA: Diagnosis not present

## 2020-02-11 NOTE — Telephone Encounter (Signed)
PLEASE CALL PT. HIS DEX SCA SHOWS OSTEOPENIA(THINNING OF THE BONES) BUT NOT OSTEOPOROSIS. HE NEEDS HIS VITAMIN D LEVEL CHECKED.  CONTINUE OSCAL THREE TIMES A DAY AFTER MEALS.

## 2020-02-11 NOTE — Telephone Encounter (Signed)
Pt stated he will have his [facility] at [facility] on 5/18

## 2020-02-24 LAB — VITAMIN D 1,25 DIHYDROXY
Vitamin D 1, 25 (OH)2 Total: 35 pg/mL (ref 18–72)
Vitamin D2 1, 25 (OH)2: <8 pg/mL
Vitamin D3 1, 25 (OH)2: 35 pg/mL

## 2020-02-26 ENCOUNTER — Telehealth: Payer: Self-pay | Admitting: General Practice

## 2020-02-26 NOTE — Telephone Encounter (Signed)
Called and notified [facility] to send over refill for pt ostomy supplies.

## 2020-02-26 NOTE — Telephone Encounter (Signed)
Order signed and fax. Pt notified

## 2020-02-26 NOTE — Telephone Encounter (Signed)
Order received, placed in LL box

## 2020-02-26 NOTE — Telephone Encounter (Signed)
Patient called and stated he needs a refill on his ostomy supplies.  Routing to Terry to make sure a new order is sent to [facility].

## 2020-02-26 NOTE — Telephone Encounter (Signed)
Done

## 2020-03-05 ENCOUNTER — Encounter: Payer: Self-pay | Admitting: Urology

## 2020-03-05 ENCOUNTER — Other Ambulatory Visit: Payer: Self-pay

## 2020-03-05 ENCOUNTER — Ambulatory Visit (INDEPENDENT_AMBULATORY_CARE_PROVIDER_SITE_OTHER): Payer: Medicaid Other | Admitting: Urology

## 2020-03-05 VITALS — BP 110/71 | HR 68 | Temp 97.0°F | Ht 66.0 in | Wt 214.0 lb

## 2020-03-05 DIAGNOSIS — N138 Other obstructive and reflux uropathy: Secondary | ICD-10-CM

## 2020-03-05 DIAGNOSIS — N401 Enlarged prostate with lower urinary tract symptoms: Secondary | ICD-10-CM

## 2020-03-05 DIAGNOSIS — R339 Retention of urine, unspecified: Secondary | ICD-10-CM

## 2020-03-05 LAB — BLADDER SCAN AMB NON-IMAGING: Scan Result: 29.2

## 2020-03-05 NOTE — Patient Instructions (Signed)

## 2020-03-05 NOTE — Progress Notes (Signed)
03/05/2020 10:39 AM   [name] January 25, 1966 [id number]  Referring provider: [name], MD [address] [place],  [place] [address]  Incomplete emptying  HPI: [name] is a 54yo here for followup for BPH with incomplete em[tying. PVR 29cc. Stream strong. No dysuira or hematuria. He is happy with urination. Nocturia rare   PMH: Past Medical History:  Diagnosis Date  . Arthritis   . Asthma   . 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 Bipolar disorder ([place])   . Colostomy in place [facility])   . Crohn's colitis ([place])    s/p total colectomy with ileostomy in 2009  . Crohn's disease of small intestine [facility]) Sept 2012   ileoscopy: multiple ulcers likely secondary to Crohn's  . DEEP VENOUS THROMBOPHLEBITIS, HX OF 02/11/2009   Qualifier: Diagnosis of  By: Zeb Comfort    . Depression   . DVT (deep venous thrombosis) ([place]) 2009   right upper extremity due to PICC  . Enterococcus UTI 2009  . GERD (gastroesophageal reflux disease)   . Hernia   . Hyperlipidemia   . Insomnia   . Migraine headache   . Nystagmus   . PULMONARY EMBOLISM, HX OF 02/11/2009   Qualifier: Diagnosis of  By: Zeb Comfort    . S/P endoscopy Sept 2012   mild gastritis, small hiatal hernia, no ulcers  . Schizophrenia ([place])   . Schizophrenia, acute [facility])     Surgical History: Past Surgical History:  Procedure Laterality Date  . APPENDECTOMY    . ESOPHAGOGASTRODUODENOSCOPY  07/2010   gastritis,  bx neg for H.Pylori  . ESOPHAGOGASTRODUODENOSCOPY N/A 01/08/2014   SLF:NO SOURCE FOR ODYNOPHAGIA IDENTIFIED/Multiple small ulcers in the gastric antrum  . EYE SURGERY    . ILEOSCOPY  06/29/2011   SLF: ulcers,multiple/small HH/mild gastritis  . KIDNEY SURGERY    . LAPAROTOMY N/A 06/05/2013   Procedure: EXPLORATORY LAPAROTOMY;  Surgeon: Donato Heinz, MD;  Location: [facility];  Service: General;  Laterality: N/A;  . LYSIS OF ADHESION N/A 06/05/2013   Procedure:  LYSIS OF ADHESIONS;  Surgeon: Donato Heinz, MD;  Location: [facility];  Service: General;  Laterality: N/A;  . small bowel capsule study  08/2010   few tiny nonbleeding erosions/ulcers ?secondary to relafen or Crohn's. Entire small bowel not seen.   Marland Kitchen TOTAL COLECTOMY  2009   with ileostomy at [facility] for refractory disease   . TOTAL HIP ARTHROPLASTY     right, due to avascular necrosis from chronic prednisone  . TOTAL SHOULDER REPLACEMENT     bilateral  . TRANSURETHRAL RESECTION OF PROSTATE N/A 12/03/2019   Procedure: TRANSURETHRAL RESECTION OF THE PROSTATE (TURP);  Surgeon: Cleon Gustin, MD;  Location: [facility];  Service: Urology;  Laterality: N/A;    Home Medications:  Allergies as of 03/05/2020      Reactions   Other Shortness Of Breath   Other reaction(s): Shortness Of Breath Throat swells Throat swells   Aspirin    REACTION: unknown reaction   Bactrim [sulfamethoxazole-trimethoprim]    ABD CRAMPS AND DIARRHEA   Codeine    REACTION: unknown reaction   Ibuprofen    REACTION: unknown reaction   Oxycodone-acetaminophen Other (See Comments)   Other reaction(s): Swelling   Penicillins    REACTION: unknown reaction   Remicade [infliximab]    COULDN'T BREATHE   Tramadol Nausea Only   Other reaction(s): Nausea   Tramadol Hcl  REACTION: unknown reaction   Vicodin [hydrocodone-acetaminophen] Nausea Only      Medication List       Accurate as of March 05, 2020 10:39 AM. If you have any questions, ask your nurse or doctor.        acetaminophen 500 MG tablet Commonly known as: TYLENOL Take 1,000 mg by mouth every 6 (six) hours as needed. For pain   atorvastatin 80 MG tablet Commonly known as: LIPITOR Take 80 mg by mouth daily.   B-12 1000 MCG Caps Take by mouth daily.   benztropine 0.5 MG tablet Commonly known as: COGENTIN Take 0.5 mg by mouth daily.   Calcium 600/Vitamin D3 600-800 MG-UNIT Tabs Generic drug: Calcium Carb-Cholecalciferol TAKE (1) TABLET BY  MOUTH THREE TIMES DAILY AFTER MEALS.   calcium-vitamin D 500-200 MG-UNIT Tabs tablet Commonly known as: OSCAL WITH D Take 1 tablet by mouth 3 (three) times daily.   dicyclomine 10 MG capsule Commonly known as: BENTYL TAKE 1 OR 2 CAPSULE BY MOUTH AS DIRECTED BEFORE MEALS AND AT BEDTIME.   divalproex 500 MG 24 hr tablet Commonly known as: DEPAKOTE ER Take 1,000 mg by mouth every evening.   divalproex 250 MG 24 hr tablet Commonly known as: DEPAKOTE ER Take 250 mg by mouth daily.   docusate sodium 100 MG capsule Commonly known as: COLACE Take 1 capsule (100 mg total) by mouth 2 (two) times daily.   folic acid 1 MG tablet Commonly known as: FOLVITE Take 1 mg by mouth daily.   furosemide 40 MG tablet Commonly known as: LASIX Take 40 mg by mouth daily.   gabapentin 300 MG capsule Commonly known as: NEURONTIN Take 900 mg by mouth 3 (three) times daily.   lidocaine 2 % solution Commonly known as: XYLOCAINE 2 TSP  PO Q4-6H PRN FOR HEARTBURN OR UPPER ABDOMINAL PAIN   meloxicam 7.5 MG tablet Commonly known as: MOBIC Take 7.5 mg by mouth daily.   mercaptopurine 50 MG tablet Commonly known as: PURINETHOL TAKE 2 TABLETS DAILY ON EMPTY STOMACH 1 HOUR BEFORE OR 2 HOURS AFTER MEAL   metFORMIN 500 MG tablet Commonly known as: GLUCOPHAGE Take by mouth 2 (two) times daily with a meal. What changed: Another medication with the same name was removed. Continue taking this medication, and follow the directions you see here. Changed by: [name], MD   mirtazapine 15 MG tablet Commonly known as: REMERON Take 15 mg by mouth at bedtime.   oxyCODONE-acetaminophen 5-325 MG tablet Commonly known as: Percocet Take 1 tablet by mouth every 4 (four) hours as needed for moderate pain or severe pain.   pantoprazole 40 MG tablet Commonly known as: PROTONIX TAKE (1) TABLET BY MOUTH TWICE A DAY BEFORE MEALS. (BREAKFAST AND SUPPER)   potassium chloride SA 20 MEQ tablet Commonly known  as: KLOR-CON Take 20 mEq by mouth daily.   albuterol (2.5 MG/3ML) 0.083% nebulizer solution Commonly known as: PROVENTIL Take 2.5 mg by nebulization every 6 (six) hours as needed for wheezing or shortness of breath.   ProAir HFA 108 (90 Base) MCG/ACT inhaler Generic drug: albuterol Inhale 2 puffs into the lungs every 6 (six) hours as needed for wheezing or shortness of breath.   risperiDONE 2 MG tablet Commonly known as: RISPERDAL Take 2 mg by mouth at bedtime.   risperiDONE 1 MG tablet Commonly known as: RISPERDAL Take 1 tablet by mouth at bedtime.   sertraline 50 MG tablet Commonly known as: ZOLOFT Take 50 mg by mouth daily.  traZODone 50 MG tablet Commonly known as: DESYREL Take 50 mg by mouth at bedtime.   Walker Wheels Misc USE AS DIRECTED       Allergies:  Allergies  Allergen Reactions  . Other Shortness Of Breath    Other reaction(s): Shortness Of Breath Throat swells Throat swells  . Aspirin     REACTION: unknown reaction  . Bactrim [Sulfamethoxazole-Trimethoprim]     ABD CRAMPS AND DIARRHEA  . Codeine     REACTION: unknown reaction  . Ibuprofen     REACTION: unknown reaction  . Oxycodone-Acetaminophen Other (See Comments)    Other reaction(s): Swelling  . Penicillins     REACTION: unknown reaction  . Remicade [Infliximab]     COULDN'T BREATHE  . Tramadol Nausea Only    Other reaction(s): Nausea  . Tramadol Hcl     REACTION: unknown reaction  . Vicodin [Hydrocodone-Acetaminophen] Nausea Only    Family History: Family History  Problem Relation Age of Onset  . Diabetes Mother   . Heart disease Mother   . Diabetes Father   . Heart disease Father     Social History:  reports that he has been smoking cigarettes. He has been smoking about 1.00 pack per day. He has never used smokeless tobacco. He reports that he does not drink alcohol or use drugs.  ROS: All other review of systems were reviewed and are negative except what is noted above in  HPI  Physical Exam: BP 110/71   Pulse 68   Temp (!) 97 F (36.1 C)   Ht 5' 6"  (1.676 m)   Wt 214 lb (97.1 kg)   BMI 34.54 kg/m   Constitutional:  Alert and oriented, No acute distress. HEENT: [place] AT, moist mucus membranes.  Trachea midline, no masses. Cardiovascular: No clubbing, cyanosis, or edema. Respiratory: Normal respiratory effort, no increased work of breathing. GI: Abdomen is soft, nontender, nondistended, no abdominal masses GU: No CVA tenderness.  Lymph: No cervical or inguinal lymphadenopathy. Skin: No rashes, bruises or suspicious lesions. Neurologic: Grossly intact, no focal deficits, moving all 4 extremities. Psychiatric: Normal mood and affect.  Laboratory Data: Lab Results  Component Value Date   WBC 5.1 12/04/2019   HGB 13.9 12/04/2019   HCT 42.3 12/04/2019   MCV 107.6 (H) 12/04/2019   PLT 167 12/04/2019    Lab Results  Component Value Date   CREATININE 0.72 12/04/2019    No results found for: PSA  No results found for: TESTOSTERONE  Lab Results  Component Value Date   HGBA1C 6.1 (H) 11/30/2019    Urinalysis    Component Value Date/Time   COLORURINE YELLOW 08/07/2019 1419   APPEARANCEUR HAZY (A) 08/07/2019 1419   LABSPEC <1.005 (L) 08/07/2019 1419   PHURINE >9.0 (H) 08/07/2019 1419   GLUCOSEU NEGATIVE 08/07/2019 1419   HGBUR SMALL (A) 08/07/2019 1419   BILIRUBINUR NEGATIVE 08/07/2019 1419   KETONESUR NEGATIVE 08/07/2019 1419   PROTEINUR >300 (A) 08/07/2019 1419   UROBILINOGEN 0.2 10/17/2014 1940   NITRITE POSITIVE (A) 08/07/2019 1419   LEUKOCYTESUR MODERATE (A) 08/07/2019 1419    Lab Results  Component Value Date   BACTERIA MANY (A) 08/07/2019    Pertinent Imaging:  No results found for this or any previous visit. No results found for this or any previous visit. No results found for this or any previous visit. No results found for this or any previous visit. Results for orders placed during the hospital encounter of 04/27/16    US Renal  Narrative CLINICAL DATA:  Bladder neck alpha obstruction. Recurrent UTIs. History of Crohn's disease. Colostomy. EXAM: RENAL / URINARY TRACT ULTRASOUND COMPLETE COMPARISON:  CT 03/05/2014 . FINDINGS: Right Kidney: Length: 12.6 cm. Echogenicity within normal limits. No mass or hydronephrosis visualized. Left Kidney: Length: 11.9 cm. Echogenicity within normal limits. No mass or hydronephrosis visualized. Bladder: Appears normal for degree of bladder distention. Bilateral ureteral jets noted. IMPRESSION: No acute or focal abnormality. Electronically Signed   By: Marcello Moores  Register   On: 04/27/2016 13:37   No results found for this or any previous visit. No results found for this or any previous visit. No results found for this or any previous visit.  Assessment & Plan:    1. Incomplete emptying of bladder -resolved. RTC 6 months with PVR - BLADDER SCAN AMB NON-IMAGING   No follow-ups on file.  [name], MD  [facility] [place]

## 2020-03-05 NOTE — Progress Notes (Signed)

## 2020-05-07 ENCOUNTER — Telehealth: Payer: Self-pay | Admitting: *Deleted

## 2020-05-07 NOTE — Telephone Encounter (Signed)
Pt called in and said that his pharmacy ([facility]) said they can't get his colostomy bags anymore.  Can you call to discuss?  [phone]

## 2020-05-08 NOTE — Telephone Encounter (Signed)
Pt called back.  Wants you to give him a call back when you get a chance.

## 2020-05-14 NOTE — Telephone Encounter (Signed)
Spoke with pt. Pt states [facility] no longer carries ostomy supplies. [place] and they [place] [phone]) and undepads. Underpads can't be covered under insurance and will be $10.00. pt is aware that his ostomy supplies will come from [facility] [employer]. If the pharmacist has any problems, they will contact our office back.

## 2020-06-04 ENCOUNTER — Other Ambulatory Visit (HOSPITAL_COMMUNITY): Payer: Self-pay | Admitting: Emergency Medicine

## 2020-06-04 ENCOUNTER — Ambulatory Visit (HOSPITAL_COMMUNITY): Admission: RE | Admit: 2020-06-04 | Payer: Medicaid Other | Source: Ambulatory Visit

## 2020-06-04 ENCOUNTER — Other Ambulatory Visit: Payer: Self-pay | Admitting: Emergency Medicine

## 2020-06-04 DIAGNOSIS — M7989 Other specified soft tissue disorders: Secondary | ICD-10-CM

## 2020-06-05 ENCOUNTER — Other Ambulatory Visit: Payer: Self-pay

## 2020-06-05 ENCOUNTER — Ambulatory Visit (HOSPITAL_COMMUNITY)
Admission: RE | Admit: 2020-06-05 | Discharge: 2020-06-05 | Disposition: A | Discharge status: Home / Self Care | Payer: Medicaid Other | Source: Ambulatory Visit | Attending: Emergency Medicine | Admitting: Emergency Medicine

## 2020-06-05 DIAGNOSIS — M7989 Other specified soft tissue disorders: Secondary | ICD-10-CM | POA: Diagnosis present

## 2020-06-06 ENCOUNTER — Ambulatory Visit: Payer: Medicaid Other | Admitting: Gastroenterology

## 2020-06-30 ENCOUNTER — Other Ambulatory Visit: Payer: Self-pay

## 2020-06-30 ENCOUNTER — Telehealth: Payer: Self-pay | Admitting: Internal Medicine

## 2020-06-30 DIAGNOSIS — R197 Diarrhea, unspecified: Secondary | ICD-10-CM

## 2020-06-30 NOTE — Telephone Encounter (Signed)
Patient called and said that he has bad diarrhea and he has tried otc products but they have not worked  [phone]

## 2020-06-30 NOTE — Telephone Encounter (Signed)
Spoke with pt. Pt has had watery diarrhea x 1 week. Pt states that he has a BM every 10 mins. Pt has some abdomina pain/cramping in his mid abdomen that comes and goes. For the first time since diarrhea started a week ago, pt vomited. Pt is able to eat foods/drink liquids and it comes out after 10 mins of eating or drinking. Pt has tried otc Pepto Bismol and Imodium. Pt takes Mercaptopurine as prescribed for his crohn's. Pt states that he was hospitalized and got out on 06/20/20 for a blood clot in his legs.

## 2020-06-30 NOTE — Telephone Encounter (Signed)
Needs Cdiff and GI pathogen panel. Follow bland diet. Drink plenty of fluids to keep urine clear.

## 2020-06-30 NOTE — Telephone Encounter (Signed)
Noted. Stool orders have been placed. Pt was advised to follow a bland diet and drink plenty of fluids.

## 2020-07-01 ENCOUNTER — Telehealth: Payer: Self-pay | Admitting: *Deleted

## 2020-07-01 NOTE — Telephone Encounter (Signed)
Patient states he can't get [facility] to pick up the test he needs to do.

## 2020-07-02 NOTE — Telephone Encounter (Signed)
Spoke with pt. Pt isn't able to get a ride to go for stool testing. Pt is going to call the doctors office that he has an appointment with tomorrow to see if they can order the stool testing. If pt is able to have stool testing with their office, I will fax stool study orders to them.

## 2020-07-03 ENCOUNTER — Other Ambulatory Visit: Payer: Self-pay | Admitting: Nurse Practitioner

## 2020-07-08 ENCOUNTER — Other Ambulatory Visit: Payer: Self-pay | Admitting: Emergency Medicine

## 2020-07-08 ENCOUNTER — Other Ambulatory Visit (HOSPITAL_COMMUNITY): Payer: Self-pay | Admitting: Emergency Medicine

## 2020-07-09 ENCOUNTER — Ambulatory Visit: Payer: Medicaid Other | Admitting: Internal Medicine

## 2020-07-14 LAB — C. DIFFICILE GDH AND TOXIN A/B
GDH ANTIGEN: NOT DETECTED
MICRO NUMBER:: 11042685
SPECIMEN QUALITY:: ADEQUATE
TOXIN A AND B: NOT DETECTED

## 2020-07-14 LAB — GASTROINTESTINAL PATHOGEN PANEL PCR
C. difficile Tox A/B, PCR: NOT DETECTED
Campylobacter, PCR: NOT DETECTED
Cryptosporidium, PCR: NOT DETECTED
E coli (ETEC) LT/ST PCR: NOT DETECTED
E coli (STEC) stx1/stx2, PCR: NOT DETECTED
E coli 0157, PCR: NOT DETECTED
Giardia lamblia, PCR: NOT DETECTED
Norovirus, PCR: NOT DETECTED
Rotavirus A, PCR: NOT DETECTED
Salmonella, PCR: NOT DETECTED
Shigella, PCR: NOT DETECTED

## 2020-07-21 ENCOUNTER — Other Ambulatory Visit: Payer: Self-pay

## 2020-07-21 ENCOUNTER — Ambulatory Visit (INDEPENDENT_AMBULATORY_CARE_PROVIDER_SITE_OTHER): Payer: Medicaid Other | Admitting: Internal Medicine

## 2020-07-21 ENCOUNTER — Encounter: Payer: Self-pay | Admitting: Internal Medicine

## 2020-07-21 VITALS — BP 100/58 | HR 88 | Temp 96.4°F | Ht 66.0 in | Wt 203.0 lb

## 2020-07-21 DIAGNOSIS — E785 Hyperlipidemia, unspecified: Secondary | ICD-10-CM

## 2020-07-21 DIAGNOSIS — R0600 Dyspnea, unspecified: Secondary | ICD-10-CM

## 2020-07-21 DIAGNOSIS — R0601 Orthopnea: Secondary | ICD-10-CM

## 2020-07-21 DIAGNOSIS — R072 Precordial pain: Secondary | ICD-10-CM

## 2020-07-21 DIAGNOSIS — R0609 Other forms of dyspnea: Secondary | ICD-10-CM

## 2020-07-21 MED ORDER — METOPROLOL TARTRATE 50 MG PO TABS: 50.0000 mg | ORAL_TABLET | Freq: Once | ORAL | 0 refills | Status: DC

## 2020-07-21 MED ORDER — IVABRADINE HCL 7.5 MG PO TABS: 7.5000 mg | ORAL_TABLET | Freq: Once | ORAL | 0 refills | Status: AC

## 2020-07-21 NOTE — Progress Notes (Signed)
Cardiology Office Note:    Date:  07/21/2020   ID:  [name], DOB 05/26/1966, MRN [id number]  PCP:  [name] Schwalbe, MD  Cardiologist:  No primary care provider on file.  Electrophysiologist:  None   Referring MD: [name] Faster, FNP   Chief Complaint/Reason for Referral: CP, DOE  History of Present Illness:    [name] is a 54 y.o. male with a history of Crohn's disease with total colectomy and ileostomy in 2009, recent diagnosis of left lower extremity DVT historical documentation of pulmonary embolism, hyperlipidemia, legal blindness, schizophrenia who presents today for evaluation of chest pain shortness of breath.  He describes retrosternal chest pain and shortness of breath at rest and with exertion.  He feels he could walk approximately 50 yards and would have to stop for shortness of breath and retrosternal chest pain.  Pain gets better after several minutes of resting.  Has not tried medication therapy to relieve this pain.  He notes that this gets worse when he smokes.  He is a former 4 pack/day smoker for 36 years but has cut back down to 1 pack/day.  He endorses orthopnea and has to sleep on 4-6 pillows at night.  He endorses snoring.  He has a history of syncope in childhood which sounds consistent with orthostasis.  At times he will have palpitations, not in conjunction with chest pain or shortness of breath.  Strong family history of MI.  His mother had her first MI at age 9 and passed at the age of 79, she was a smoker.  His father as well as all of his grandparents had MI as well.   Past Medical History:  Diagnosis Date  . Arthritis   . Asthma   . 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 Bipolar disorder ([place])   . Colostomy in place [facility])   . Crohn's colitis ([place])    s/p total colectomy with ileostomy in 2009  . Crohn's disease of small intestine [facility]) Sept 2012   ileoscopy: multiple ulcers likely  secondary to Crohn's  . DEEP VENOUS THROMBOPHLEBITIS, HX OF 02/11/2009   Qualifier: Diagnosis of  By: Zeb Comfort    . Depression   . DVT (deep venous thrombosis) ([place]) 2009   right upper extremity due to PICC  . Enterococcus UTI 2009  . GERD (gastroesophageal reflux disease)   . Hernia   . Hyperlipidemia   . Insomnia   . Migraine headache   . Nystagmus   . PULMONARY EMBOLISM, HX OF 02/11/2009   Qualifier: Diagnosis of  By: Zeb Comfort    . S/P endoscopy Sept 2012   mild gastritis, small hiatal hernia, no ulcers  . Schizophrenia ([place])   . Schizophrenia, acute [facility])     Past Surgical History:  Procedure Laterality Date  . APPENDECTOMY    . ESOPHAGOGASTRODUODENOSCOPY  07/2010   gastritis,  bx neg for H.Pylori  . ESOPHAGOGASTRODUODENOSCOPY N/A 01/08/2014   SLF:NO SOURCE FOR ODYNOPHAGIA IDENTIFIED/Multiple small ulcers in the gastric antrum  . EYE SURGERY    . ILEOSCOPY  06/29/2011   SLF: ulcers,multiple/small HH/mild gastritis  . KIDNEY SURGERY    . LAPAROTOMY N/A 06/05/2013   Procedure: EXPLORATORY LAPAROTOMY;  Surgeon: Donato Heinz, MD;  Location: [facility] ORS;  Service: General;  Laterality: N/A;  . LYSIS OF ADHESION N/A 06/05/2013   Procedure: LYSIS OF ADHESIONS;  Surgeon: Donato Heinz, MD;  Location: [facility]  ORS;  Service: General;  Laterality: N/A;  . small bowel capsule study  08/2010   few tiny nonbleeding erosions/ulcers ?secondary to relafen or Crohn's. Entire small bowel not seen.   Marland Kitchen TOTAL COLECTOMY  2009   with ileostomy at [facility] for refractory disease   . TOTAL HIP ARTHROPLASTY     right, due to avascular necrosis from chronic prednisone  . TOTAL SHOULDER REPLACEMENT     bilateral  . TRANSURETHRAL RESECTION OF PROSTATE N/A 12/03/2019   Procedure: TRANSURETHRAL RESECTION OF THE PROSTATE (TURP);  Surgeon: Cleon Gustin, MD;  Location: [facility] ORS;  Service: Urology;  Laterality: N/A;    Current Medications: Current Meds  Medication Sig  . acetaminophen (TYLENOL)  500 MG tablet Take 1,000 mg by mouth every 6 (six) hours as needed. For pain   . albuterol (PROAIR HFA) 108 (90 BASE) MCG/ACT inhaler Inhale 2 puffs into the lungs every 6 (six) hours as needed for wheezing or shortness of breath.  Marland Kitchen albuterol (PROVENTIL) (2.5 MG/3ML) 0.083% nebulizer solution Take 2.5 mg by nebulization every 6 (six) hours as needed for wheezing or shortness of breath.  Marland Kitchen atorvastatin (LIPITOR) 80 MG tablet Take 80 mg by mouth daily.  . benztropine (COGENTIN) 0.5 MG tablet Take 0.5 mg by mouth daily.   Marland Kitchen CALCIUM 600/VITAMIN D3 600-800 MG-UNIT TABS TAKE (1) TABLET BY MOUTH THREE TIMES DAILY AFTER MEALS.  Marland Kitchen Cyanocobalamin (B-12) 1000 MCG CAPS Take by mouth daily.  Marland Kitchen dicyclomine (BENTYL) 10 MG capsule TAKE 1 OR 2 CAPSULE BY MOUTH AS DIRECTED BEFORE MEALS AND AT BEDTIME.  . divalproex (DEPAKOTE ER) 250 MG 24 hr tablet Take 250 mg by mouth daily.   . divalproex (DEPAKOTE) 500 MG 24 hr tablet Take 1,000 mg by mouth every evening.   . docusate sodium (COLACE) 100 MG capsule Take 1 capsule (100 mg total) by mouth 2 (two) times daily.  . folic acid (FOLVITE) 1 MG tablet Take 1 mg by mouth daily.    . furosemide (LASIX) 40 MG tablet Take 40 mg by mouth daily.    Marland Kitchen gabapentin (NEURONTIN) 300 MG capsule Take 900 mg by mouth 3 (three) times daily.   Marland Kitchen lidocaine (XYLOCAINE) 2 % solution 2 TSP  PO Q4-6H PRN FOR HEARTBURN OR UPPER ABDOMINAL PAIN  . meloxicam (MOBIC) 7.5 MG tablet Take 7.5 mg by mouth daily.  . mercaptopurine (PURINETHOL) 50 MG tablet TAKE 2 TABLETS DAILY ON EMPTY STOMACH 1 HOUR BEFORE OR 2 HOURS AFTER MEAL  . metFORMIN (GLUCOPHAGE) 500 MG tablet Take by mouth 2 (two) times daily with a meal.  . mirtazapine (REMERON) 15 MG tablet Take 15 mg by mouth at bedtime.  . Misc. Devices ([place]) MISC USE AS DIRECTED  . oxyCODONE-acetaminophen (PERCOCET) 5-325 MG tablet Take 1 tablet by mouth every 4 (four) hours as needed for moderate pain or severe pain.  . pantoprazole  (PROTONIX) 40 MG tablet TAKE (1) TABLET BY MOUTH TWICE A DAY BEFORE MEALS. (BREAKFAST AND SUPPER)  . potassium chloride SA (K-DUR,KLOR-CON) 20 MEQ tablet Take 20 mEq by mouth daily.   . risperiDONE (RISPERDAL) 1 MG tablet Take 1 tablet by mouth at bedtime.  . risperiDONE (RISPERDAL) 2 MG tablet Take 2 mg by mouth at bedtime.   . sertraline (ZOLOFT) 50 MG tablet Take 50 mg by mouth daily.  . traZODone (DESYREL) 50 MG tablet Take 50 mg by mouth at bedtime.     Allergies:   Other, Aspirin, Bactrim [sulfamethoxazole-trimethoprim], Codeine, Ibuprofen, Oxycodone-acetaminophen, Penicillins,  Remicade [infliximab], Tramadol, Tramadol hcl, and Vicodin [hydrocodone-acetaminophen]   Social History   Tobacco Use  . Smoking status: Current Every Day Smoker    Packs/day: 1.00    Types: Cigarettes  . Smokeless tobacco: Never Used  . Tobacco comment: one pack daily  Vaping Use  . Vaping Use: Never used  Substance Use Topics  . Alcohol use: No  . Drug use: No     Family History: The patient's family history includes Diabetes in his father and mother; Heart disease in his father and mother.  ROS:   Please see the history of present illness.    All other systems reviewed and are negative.  EKGs/Labs/Other Studies Reviewed:    The following studies were reviewed today:  EKG: Normal sinus rhythm, nonspecific T wave abnormality.  I have independently reviewed the images from CT abdomen pelvis 02/09/2018, 12/29/2017.  Chest x-ray 02/10/2018.  Recent Labs: 12/04/2019: BUN 8; Creatinine, Ser 0.72; Hemoglobin 13.9; Platelets 167; Potassium 4.7; Sodium 143  Recent Lipid Panel No results found for: CHOL, TRIG, HDL, CHOLHDL, VLDL, LDLCALC, LDLDIRECT  Physical Exam:    VS:  BP (!) 100/58 (BP Location: Left Arm, Patient Position: Sitting, Cuff Size: Normal)   Pulse 88   Temp (!) 96.4 F (35.8 C)   Ht 5' 6"  (1.676 m)   Wt 203 lb (92.1 kg)   BMI 32.77 kg/m     Wt Readings from Last 5 Encounters:    07/21/20 203 lb (92.1 kg)  03/05/20 214 lb (97.1 kg)  02/06/20 218 lb 3.2 oz (99 kg)  12/07/19 220 lb (99.8 kg)  12/03/19 220 lb (99.8 kg)    Constitutional: No acute distress Eyes: sclera non-icteric, constant nystagmus ENMT: Mask in place Cardiovascular: regular rhythm, normal rate, no murmurs. S1 and S2 normal. No jugular venous distention.  Respiratory: Diffuse expiratory wheeze GI : Right lower quadrant ileostomy in place. MSK: Left lower extremity is erythematous, edematous.  Grossly normal right lower extremity NEURO: grossly nonfocal exam, moves all extremities. PSYCH: alert and oriented x 3, normal mood and affect.   ASSESSMENT:    1. Precordial pain   2. Dyspnea on exertion   3. Orthopnea   4. Hyperlipidemia, unspecified hyperlipidemia type    PLAN:    Precordial pain  Dyspnea on exertion Orthopnea -He has risk factors of hyperlipidemia, very heavy smoking history, and is experiencing exertional chest pain and shortness of breath. -With active wheezing, he is a poor candidate for regadenoson (Lexiscan) nuclear stress testing. -He has previously tolerated beta-blockade from a respiratory standpoint but this was stopped due to hypotension.  I think he will tolerate a one-time dose of beta-blocker from a respiratory standpoint to facilitate coronary CTA to evaluate chest pain and shortness of breath. Will also provide one time dose of ivabradine for HR control.  On limited views of CT abdomen pelvis there is no significant distal RCA calcification visualized. -He is also quite short of breath with activity and is endorsing orthopnea as well as snoring.  He will need an echocardiogram for right-sided heart pressure as well as biventricular function. - continue lasix 40 mg daily.   Hyperlipidemia - continue atorvastatin 80 mg daily.   Total time of encounter: 45 minutes total time of encounter, including 25 minutes spent in face-to-face patient care on the date of this  encounter. This time includes coordination of care and counseling regarding above mentioned problem list. Remainder of non-face-to-face time involved reviewing chart documents/testing relevant to the patient encounter  and documentation in the medical record. I have independently reviewed documentation from referring provider.   [name] Kaiser, MD [place]  [facility]    Medication Adjustments/Labs and Tests Ordered: Current medicines are reviewed at length with the patient today.  Concerns regarding medicines are outlined above.   Orders Placed This Encounter  Procedures  . CT CORONARY MORPH W/CTA COR W/SCORE W/CA W/CM &/OR WO/CM  . CT CORONARY FRACTIONAL FLOW RESERVE DATA PREP  . CT CORONARY FRACTIONAL FLOW RESERVE FLUID ANALYSIS  . Basic metabolic panel  . EKG 12-Lead  . ECHOCARDIOGRAM COMPLETE    Meds ordered this encounter  Medications  . ivabradine (CORLANOR) 7.5 MG TABS tablet    Sig: Take 1 tablet (7.5 mg total) by mouth once for 1 dose. PLEASE TAKE THIS 2 HOURS PRIOR TO CTA SCAN    Dispense:  1 tablet    Refill:  0  . metoprolol tartrate (LOPRESSOR) 50 MG tablet    Sig: Take 1 tablet (50 mg total) by mouth once for 1 dose. PLEASE TAKE 85m (1 TABLET) 1 HOUR PRIOR TO CTA SCAN    Dispense:  1 tablet    Refill:  0    Patient Instructions  Medication Instructions:  IVABRADINE (7.564m 1 TABLET--- PLEASE TAKE THIS 2 HOURS PRIOR TO YOUR CTA SCAN   METOPROLOL (5074m1 TABLET--- PLEASE TAKE THIS 1 HOUR PRIOR TO CTA SCAN  *If you need a refill on your cardiac medications before your next appointment, please call your pharmacy*  Lab Work: BMET- PLEASE HAVE THIS DONE 1 WEEK PRIOR TO CTA SCAN. YOU DO NOT NEED AN APPOINTMENT. LAB IS OPEN 8-4 MON- FRI If you have labs (blood work) drawn today and your tests are completely normal, you will receive your results only by: . MMarland KitchenChart Message (if you have MyChart) OR . A paper copy in the mail If you have any lab test that is  abnormal or we need to change your treatment, we will call you to review the results.  Testing/Procedures: Your physician has requested that you have an echocardiogram. Echocardiography is a painless test that uses sound waves to create images of your heart. It provides your doctor with information about the size and shape of your heart and how well your heart's chambers and valves are working. You may receive an ultrasound enhancing agent through an IV if needed to better visualize your heart during the echo.This procedure takes approximately one hour. There are no restrictions for this procedure. This [address] take place at [address]   Your cardiac CT will be scheduled at one of the below locations:   [facility] [id number] scheduled at [facility] minutes prior to test start time. Pro[facility] to the [facility] (first floor) to check-in and test prep.   Please follow these instructions carefully (unless otherwise directed):  Hold all erectile dysfunction medications at least 3 days (72 hrs) prior to test.  On the Night Before the Test: . Be sure to Drink plenty of water. . Do not consume any caffeinated/decaffeinated beverages or chocolate 12 hours prior to your test. . Do not take any antihistamines 12 hours prior to your test.  On the Day of the Test: . Drink plenty of water. Do not drink any water within one hour of the test. . Do  not eat any food 4 hours prior to the test. . You may take your regular medications prior to the test.  . Take metoprolol (Lopressor) 1 hours prior to test. . PLEASE TAKE IVABRADINE 2 HOURS PRIOR TO TEST  . HOLD Furosemide/Hydrochlorothiazide morning of the test. .   After the Test: . Drink plenty of water. . After receiving IV contrast, you may experience a mild flushed  feeling. This is normal. . On occasion, you may experience a mild rash up to 24 hours after the test. This is not dangerous. If this occurs, you can take Benadryl 25 mg and increase your fluid intake. . If you experience trouble breathing, this can be serious. If it is severe call 911 IMMEDIATELY. If it is mild, please call our office. . If you take any of these medications: Glipizide/Metformin, Avandament, Glucavance, please do not take 48 hours after completing test unless otherwise instructed. Marland Kitchen PLEASE DO NOT TAKE METFORMIN for 48 HOURS AFTER TEST.   Once we have confirmed authorization from your insurance company, we will call you to set up a date and time for your test. Based on how quickly your insurance processes prior authorizations requests, please allow up to 4 weeks to be contacted for scheduling your Cardiac CT appointment. Be advised that routine Cardiac CT appointments could be scheduled as many as 8 weeks after your provider has ordered it.  For non-scheduling related questions, please contact the cardiac imaging nurse navigator should you have any questions/concerns: Marchia Bond, Cardiac Imaging Nurse Navigator Burley Saver, Interim Cardiac Imaging Nurse [place] and Vascular Services Direct Office Dial: [phone]   For scheduling needs, including cancellations and rescheduling, please call Vivien Rota at [phone], option 3.   Follow-Up: At [facility], you and your health needs are our priority.  As part of our continuing mission to provide you with exceptional heart care, we have created designated Provider Care Teams.  These Care Teams include your primary Cardiologist (physician) and Advanced Practice Providers (APPs -  Physician Assistants and Nurse Practitioners) who all work together to provide you with the care you need, when you need it.  We recommend signing up for the patient portal called "MyChart".  Sign up information is provided on this After Visit  Summary.  MyChart is used to connect with patients for Virtual Visits (Telemedicine).  Patients are able to view lab/test results, encounter notes, upcoming appointments, etc.  Non-urgent messages can be sent to your provider as well.   To learn more about what you can do with MyChart, go to [email or web address].    Your next appointment:   8 week(s)  The format for your next appointment:   In Person  Provider:   Cherlynn Kaiser, MD

## 2020-07-21 NOTE — Patient Instructions (Signed)
Medication Instructions:  IVABRADINE (7.45m) 1 TABLET--- PLEASE TAKE THIS 2 HOURS PRIOR TO YOUR CTA SCAN   METOPROLOL (518m 1 TABLET--- PLEASE TAKE THIS 1 HOUR PRIOR TO CTA SCAN  *If you need a refill on your cardiac medications before your next appointment, please call your pharmacy*  Lab Work: BMET- PLEASE HAVE THIS DONE 1 WEEK PRIOR TO CTA SCAN. YOU DO NOT NEED AN APPOINTMENT. LAB IS OPEN 8-4 MON- FRI If you have labs (blood work) drawn today and your tests are completely normal, you will receive your results only by: . Marland KitchenyChart Message (if you have MyChart) OR . A paper copy in the mail If you have any lab test that is abnormal or we need to change your treatment, we will call you to review the results.  Testing/Procedures: Your physician has requested that you have an echocardiogram. Echocardiography is a painless test that uses sound waves to create images of your heart. It provides your doctor with information about the size and shape of your heart and how well your heart's chambers and valves are working. You may receive an ultrasound enhancing agent through an IV if needed to better visualize your heart during the echo.This procedure takes approximately one hour. There are no restrictions for this procedure. This will take place at the [address]   Your cardiac CT will be scheduled at one of the below locations:   [facility] [phone] scheduled at [facility] at the [facility] minutes prior to test start time. Proceed to the [facility] (first floor) to check-in and test prep.   Please follow these instructions carefully (unless otherwise directed):  Hold all erectile dysfunction medications at least 3 days (72 hrs) prior to test.  On the Night Before the Test: . Be sure to Drink plenty of water. . Do not  consume any caffeinated/decaffeinated beverages or chocolate 12 hours prior to your test. . Do not take any antihistamines 12 hours prior to your test.  On the Day of the Test: . Drink plenty of water. Do not drink any water within one hour of the test. . Do not eat any food 4 hours prior to the test. . You may take your regular medications prior to the test.  . Take metoprolol (Lopressor) 1 hours prior to test. . PLEASE TAKE IVABRADINE 2 HOURS PRIOR TO TEST  . HOLD Furosemide/Hydrochlorothiazide morning of the test. .   After the Test: . Drink plenty of water. . After receiving IV contrast, you may experience a mild flushed feeling. This is normal. . On occasion, you may experience a mild rash up to 24 hours after the test. This is not dangerous. If this occurs, you can take Benadryl 25 mg and increase your fluid intake. . If you experience trouble breathing, this can be serious. If it is severe call 911 IMMEDIATELY. If it is mild, please call our office. . If you take any of these medications: Glipizide/Metformin, Avandament, Glucavance, please do not take 48 hours after completing test unless otherwise instructed. . Marland KitchenLEASE DO NOT TAKE METFORMIN for 48 HOURS AFTER TEST.   Once we have confirmed authorization from your insurance company, we will call you to set up a date and time for your test. Based on how quickly your insurance processes prior authorizations requests, please allow up to 4 weeks to  be contacted for scheduling your Cardiac CT appointment. Be advised that routine Cardiac CT appointments could be scheduled as many as 8 weeks after your provider has ordered it.  For non-scheduling related questions, please contact the cardiac imaging nurse navigator should you have any questions/concerns: Marchia Bond, Cardiac Imaging Nurse Navigator Burley Saver, Interim Cardiac Imaging Nurse [place] and Vascular Services Direct Office Dial: [phone]   For scheduling  needs, including cancellations and rescheduling, please call Vivien Rota at [phone], option 3.   Follow-Up: At [facility], you and your health needs are our priority.  As part of our continuing mission to provide you with exceptional heart care, we have created designated Provider Care Teams.  These Care Teams include your primary Cardiologist (physician) and Advanced Practice Providers (APPs -  Physician Assistants and Nurse Practitioners) who all work together to provide you with the care you need, when you need it.  We recommend signing up for the patient portal called "MyChart".  Sign up information is provided on this After Visit Summary.  MyChart is used to connect with patients for Virtual Visits (Telemedicine).  Patients are able to view lab/test results, encounter notes, upcoming appointments, etc.  Non-urgent messages can be sent to your provider as well.   To learn more about what you can do with MyChart, go to [email or web address].    Your next appointment:   8 week(s)  The format for your next appointment:   In Person  Provider:   Cherlynn Kaiser, MD

## 2020-07-24 ENCOUNTER — Other Ambulatory Visit: Payer: Self-pay

## 2020-07-24 ENCOUNTER — Inpatient Hospital Stay (HOSPITAL_COMMUNITY): Payer: Medicaid Other | Attending: Hematology | Admitting: Hematology

## 2020-07-24 DIAGNOSIS — I82409 Acute embolism and thrombosis of unspecified deep veins of unspecified lower extremity: Secondary | ICD-10-CM

## 2020-07-24 DIAGNOSIS — Z7901 Long term (current) use of anticoagulants: Secondary | ICD-10-CM | POA: Diagnosis not present

## 2020-07-24 DIAGNOSIS — E7800 Pure hypercholesterolemia, unspecified: Secondary | ICD-10-CM | POA: Diagnosis not present

## 2020-07-24 DIAGNOSIS — Z933 Colostomy status: Secondary | ICD-10-CM | POA: Diagnosis not present

## 2020-07-24 DIAGNOSIS — Z86718 Personal history of other venous thrombosis and embolism: Secondary | ICD-10-CM | POA: Diagnosis present

## 2020-07-24 DIAGNOSIS — E119 Type 2 diabetes mellitus without complications: Secondary | ICD-10-CM | POA: Diagnosis not present

## 2020-07-24 DIAGNOSIS — F17210 Nicotine dependence, cigarettes, uncomplicated: Secondary | ICD-10-CM | POA: Diagnosis not present

## 2020-07-24 DIAGNOSIS — F209 Schizophrenia, unspecified: Secondary | ICD-10-CM | POA: Insufficient documentation

## 2020-07-24 DIAGNOSIS — I10 Essential (primary) hypertension: Secondary | ICD-10-CM | POA: Diagnosis not present

## 2020-07-24 DIAGNOSIS — Z794 Long term (current) use of insulin: Secondary | ICD-10-CM | POA: Diagnosis not present

## 2020-07-24 DIAGNOSIS — M1990 Unspecified osteoarthritis, unspecified site: Secondary | ICD-10-CM | POA: Insufficient documentation

## 2020-07-24 DIAGNOSIS — Z79899 Other long term (current) drug therapy: Secondary | ICD-10-CM | POA: Diagnosis not present

## 2020-07-24 DIAGNOSIS — Z86711 Personal history of pulmonary embolism: Secondary | ICD-10-CM | POA: Insufficient documentation

## 2020-07-24 DIAGNOSIS — Z791 Long term (current) use of non-steroidal anti-inflammatories (NSAID): Secondary | ICD-10-CM | POA: Diagnosis not present

## 2020-07-24 NOTE — Progress Notes (Signed)
CONSULT NOTE  Patient Care Team: [name], MD as PCP - General (Family Medicine) Danie Binder, MD (Inactive) (Gastroenterology)  CHIEF COMPLAINTS/PURPOSE OF CONSULTATION: Recurrent DVTs  HISTORY OF PRESENTING ILLNESS:  [name] 54 y.o. male is here because of recurrent DVTs.  He was recently hospitalized for left lower extremity pain in the knee, distal thigh and calf.  He was treated for superficial thrombophlebitis with a Doppler flow study negative for DVT.  Developed recurrent and increasing swelling to the left lower extremity.  Underwent a repeat Doppler flow study which demonstrated acute and extensive left lower extremity embolus.  Has CTA scheduled for 08/07/2020.  The patient was placed on anticoagulation 5 mg Eliquis twice daily and referred to hematology.  He has past medical history significant for left upper extremity DVT requiring anticoagulation.  He also reports a family history of spontaneous DVT (nonprovoked) in several family members on his mom side.  Patient is a current everyday smoker for the past 36 years.  He smokes about half a pack of cigarettes per day down from 2 packs/day.  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  He is currently doing "better".  He is able to bear weight on his left leg with the use of a walker.  He feels the swelling is slowly going away in his left leg.  He is compliant with his Eliquis.  Notes that his most recent blood clot was not provoked.  He denies recent surgery or long travel where he was sedentary.  Denies any shortness of breath or chest pain.  Denies a heart attack or stroke.  He states his mother and other family members were never tested for blood clotting disorders.   MEDICAL HISTORY:  Past Medical History:  Diagnosis Date  . Arthritis   . Asthma   . 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HCC)    right, s/p  replacement, due to prednisone  . Bipolar disorder ([place])   . Colostomy in place [facility])   . Crohn's colitis ([place])    s/p total colectomy with ileostomy in 2009  . Crohn's disease of small intestine [facility]) Sept 2012   ileoscopy: multiple ulcers likely secondary to Crohn's  . DEEP VENOUS THROMBOPHLEBITIS, HX OF 02/11/2009   Qualifier: Diagnosis of  By: Zeb Comfort    . Depression   . DVT (deep venous thrombosis) ([place]) 2009   right upper extremity due to PICC  . Enterococcus UTI 2009  . GERD (gastroesophageal reflux disease)   . Hernia   . Hyperlipidemia   . Insomnia   . Migraine headache   . Nystagmus   . PULMONARY EMBOLISM, HX OF 02/11/2009   Qualifier: Diagnosis of  By: Zeb Comfort    . S/P endoscopy Sept 2012   mild gastritis, small hiatal hernia, no ulcers  . Schizophrenia ([place])   . Schizophrenia, acute ([place])     SURGICAL HISTORY: Past Surgical History:  Procedure Laterality Date  . APPENDECTOMY    . ESOPHAGOGASTRODUODENOSCOPY  07/2010   gastritis,  bx neg for H.Pylori  . ESOPHAGOGASTRODUODENOSCOPY N/A 01/08/2014   SLF:NO SOURCE FOR ODYNOPHAGIA IDENTIFIED/Multiple small ulcers in the gastric antrum  . EYE SURGERY    . ILEOSCOPY  06/29/2011   SLF: ulcers,multiple/small HH/mild gastritis  . KIDNEY SURGERY    . LAPAROTOMY N/A 06/05/2013   Procedure: EXPLORATORY LAPAROTOMY;  Surgeon: Donato Heinz, MD;  Location: [facility]  ORS;  Service: General;  Laterality: N/A;  . LYSIS OF ADHESION N/A 06/05/2013   Procedure: LYSIS OF ADHESIONS;  Surgeon: Donato Heinz, MD;  Location: [facility] ORS;  Service: General;  Laterality: N/A;  . small bowel capsule study  08/2010   few tiny nonbleeding erosions/ulcers ?secondary to relafen or Crohn's. Entire small bowel not seen.   Marland Kitchen TOTAL COLECTOMY  2009   with ileostomy at [facility] for refractory disease   . TOTAL HIP ARTHROPLASTY     right, due to avascular necrosis from chronic prednisone  . TOTAL SHOULDER REPLACEMENT     bilateral  . TRANSURETHRAL  RESECTION OF PROSTATE N/A 12/03/2019   Procedure: TRANSURETHRAL RESECTION OF THE PROSTATE (TURP);  Surgeon: Cleon Gustin, MD;  Location: [facility] ORS;  Service: Urology;  Laterality: N/A;    SOCIAL HISTORY: Social History   Socioeconomic History  . Marital status: Single    Spouse name: Not on file  . Number of children: Not on file  . Years of education: Not on file  . Highest education level: Not on file  Occupational History  . Not on file  Tobacco Use  . Smoking status: Current Every Day Smoker    Packs/day: 1.00    Types: Cigarettes  . Smokeless tobacco: Never Used  . Tobacco comment: one pack daily  Vaping Use  . Vaping Use: Never used  Substance and Sexual Activity  . Alcohol use: No  . Drug use: No  . Sexual activity: Never  Other Topics Concern  . Not on file  Social History Narrative   Girlfriend of six years, Rectal Cancer on Hosp[facility] (05/2011).   Social Determinants of Health   Financial Resource Strain:   . Difficulty of Paying Living Expenses: Not on file  Food Insecurity:   . Worried About Charity fundraiser in the Last Year: Not on file  . Ran Out of Food in the Last Year: Not on file  Transportation Needs:   . Lack of Transportation (Medical): Not on file  . Lack of Transportation (Non-Medical): Not on file  Physical Activity:   . Days of Exercise per Week: Not on file  . Minutes of Exercise per Session: Not on file  Stress:   . Feeling of Stress : Not on file  Social Connections:   . Frequency of Communication with Friends and Family: Not on file  . Frequency of Social Gatherings with Friends and Family: Not on file  . Attends Religious Services: Not on file  . Active Member of Clubs or Organizations: Not on file  . Attends Archivist Meetings: Not on file  . Marital Status: Not on file  Intimate Partner Violence:   . Fear of Current or Ex-Partner: Not on file  . Emotionally Abused: Not on file  . Physically Abused: Not on file  .  Sexually Abused: Not on file    FAMILY HISTORY: Family History  Problem Relation Age of Onset  . Diabetes Mother   . Heart disease Mother   . Diabetes Father   . Heart disease Father     ALLERGIES:  is allergic to other, aspirin, bactrim [sulfamethoxazole-trimethoprim], codeine, ibuprofen, oxycodone-acetaminophen, penicillins, remicade [infliximab], tramadol, tramadol hcl, and vicodin [hydrocodone-acetaminophen].  MEDICATIONS:  Current Outpatient Medications  Medication Sig Dispense Refill  . ACCU-CHEK AVIVA PLUS test strip SMARTSIG:Via Meter    . acetaminophen (TYLENOL) 500 MG tablet Take 1,000 mg by mouth every 6 (six) hours as needed. For pain     .  albuterol (PROAIR HFA) 108 (90 BASE) MCG/ACT inhaler Inhale 2 puffs into the lungs every 6 (six) hours as needed for wheezing or shortness of breath.    Marland Kitchen albuterol (PROVENTIL) (2.5 MG/3ML) 0.083% nebulizer solution Take 2.5 mg by nebulization every 6 (six) hours as needed for wheezing or shortness of breath.    Marland Kitchen atorvastatin (LIPITOR) 80 MG tablet Take 80 mg by mouth daily.    Marland Kitchen azelastine (ASTELIN) 0.1 % nasal spray Place 2 sprays into both nostrils 2 (two) times daily.    . benztropine (COGENTIN) 0.5 MG tablet Take 0.5 mg by mouth daily.     Marland Kitchen CALCIUM 600/VITAMIN D3 600-800 MG-UNIT TABS TAKE (1) TABLET BY MOUTH THREE TIMES DAILY AFTER MEALS. 84 tablet 5  . CORLANOR 5 MG TABS tablet Take by mouth at bedtime.    . Cyanocobalamin (B-12) 1000 MCG CAPS Take by mouth daily.    Marland Kitchen dicyclomine (BENTYL) 10 MG capsule TAKE 1 OR 2 CAPSULE BY MOUTH AS DIRECTED BEFORE MEALS AND AT BEDTIME. 60 capsule 3  . divalproex (DEPAKOTE ER) 250 MG 24 hr tablet Take 250 mg by mouth daily.     . divalproex (DEPAKOTE) 500 MG 24 hr tablet Take 1,000 mg by mouth every evening.     . docusate sodium (COLACE) 100 MG capsule Take 1 capsule (100 mg total) by mouth 2 (two) times daily. 10 capsule 0  . ELIQUIS 5 MG TABS tablet Take 5 mg by mouth 2 (two) times daily.     . folic acid (FOLVITE) 1 MG tablet Take 1 mg by mouth daily.      . furosemide (LASIX) 40 MG tablet Take 40 mg by mouth daily.      Marland Kitchen gabapentin (NEURONTIN) 300 MG capsule Take 900 mg by mouth 3 (three) times daily.     Marland Kitchen lidocaine (XYLOCAINE) 2 % solution 2 TSP  PO Q4-6H PRN FOR HEARTBURN OR UPPER ABDOMINAL PAIN 300 mL 11  . meloxicam (MOBIC) 7.5 MG tablet Take 7.5 mg by mouth daily.    . mercaptopurine (PURINETHOL) 50 MG tablet TAKE 2 TABLETS DAILY ON EMPTY STOMACH 1 HOUR BEFORE OR 2 HOURS AFTER MEAL 60 tablet 11  . metFORMIN (GLUCOPHAGE) 500 MG tablet Take by mouth 2 (two) times daily with a meal.    . mirtazapine (REMERON) 15 MG tablet Take 15 mg by mouth at bedtime.    . Misc. Devices (WALKER WHEELS) MISC USE AS DIRECTED 1 each 0  . Ostomy Supplies (ACTIVE LIFE 1-PC DRAIN [id number]) Pouch MISC     . pantoprazole (PROTONIX) 40 MG tablet TAKE (1) TABLET BY MOUTH TWICE A DAY BEFORE MEALS. (BREAKFAST AND SUPPER) 60 tablet 11  . potassium chloride SA (K-DUR,KLOR-CON) 20 MEQ tablet Take 20 mEq by mouth daily.     . risperiDONE (RISPERDAL) 1 MG tablet Take 1 tablet by mouth at bedtime.    . risperiDONE (RISPERDAL) 2 MG tablet Take 2 mg by mouth at bedtime.     . sertraline (ZOLOFT) 50 MG tablet Take 50 mg by mouth daily.    . tamsulosin (FLOMAX) 0.4 MG CAPS capsule Take 0.4 mg by mouth daily.    . traZODone (DESYREL) 50 MG tablet Take 50 mg by mouth at bedtime.    . metoprolol tartrate (LOPRESSOR) 50 MG tablet Take 1 tablet (50 mg total) by mouth once for 1 dose. PLEASE TAKE 44m (1 TABLET) 1 HOUR PRIOR TO CTA SCAN 1 tablet 0   No current facility-administered medications for this  visit.    REVIEW OF SYSTEMS:   Constitutional: Denies fevers, chills or abnormal night sweats Eyes: Denies blurriness of vision, double vision or watery eyes Ears, nose, mouth, throat, and face: Denies mucositis or sore throat Respiratory: Denies cough, dyspnea or wheezes Cardiovascular: Denies palpitation, chest  discomfort or lower extremity swelling Gastrointestinal:  Denies nausea, heartburn or change in bowel habits Skin: Denies abnormal skin rashes Lymphatics: Denies new lymphadenopathy or easy bruising Neurological:Denies numbness, tingling or new weaknesses Behavioral/Psych: Mood is stable, no new changes  All other systems were reviewed with the patient and are negative.  PHYSICAL EXAMINATION: ECOG PERFORMANCE STATUS: 1 - Symptomatic but completely ambulatory  Vitals:   07/24/20 1314  BP: 112/74  Pulse: 80  Resp: 18  Temp: (!) 97 F (36.1 C)  SpO2: 99%   Filed Weights   07/24/20 1314  Weight: 202 lb 1.6 oz (91.7 kg)    Physical Exam Constitutional:      Appearance: Normal appearance.  HENT:     Head: Normocephalic and atraumatic.  Eyes:     Pupils: Pupils are equal, round, and reactive to light.  Cardiovascular:     Rate and Rhythm: Normal rate and regular rhythm.     Heart sounds: Normal heart sounds. No murmur heard.   Pulmonary:     Effort: Pulmonary effort is normal.     Breath sounds: Normal breath sounds. No wheezing.  Abdominal:     General: Bowel sounds are normal. There is no distension.     Palpations: Abdomen is soft.     Tenderness: There is no abdominal tenderness.  Musculoskeletal:        General: Normal range of motion.     Cervical back: Normal range of motion.     Left lower leg: Swelling present. Edema present.  Skin:    General: Skin is warm and dry.     Findings: No rash.  Neurological:     Mental Status: He is alert and oriented to person, place, and time.  Psychiatric:        Judgment: Judgment normal.     LABORATORY DATA:  I have reviewed the data as listed Recent Results (from the past 2160 hour(s))  Gastrointestinal Pathogen Panel PCR     Status: None   Collection Time: 07/09/20  2:36 PM  Result Value Ref Range   Campylobacter, PCR NOT DETECTED NOT DETECT   C. difficile Tox A/B, PCR NOT DETECTED NOT DETECT   E coli 0157, PCR NOT  DETECTED NOT DETECT   E coli (ETEC) LT/ST PCR NOT DETECTED NOT DETECT   E coli (STEC) stx1/stx2, PCR NOT DETECTED NOT DETECT   Salmonella, PCR NOT DETECTED NOT DETECT   Shigella, PCR NOT DETECTED NOT DETECT   Norovirus, PCR NOT DETECTED NOT DETECT   Rotavirus A, PCR NOT DETECTED NOT DETECT   Giardia lamblia, PCR NOT DETECTED NOT DETECT   Cryptosporidium, PCR NOT DETECTED NOT DETECT    Comment: . The xTAG(R) Gastrointestinal Pathogen Panel results are presumptive and must be confirmed by FDA-cleared tests or other acceptable reference methods. The results of this test should not be used as the sole basis for diagnosis, treatment, or other patient management decisions. . Performed using the [employer] xTAG(R) Gastrointestinal Pathogen Panel test kit.   C. difficile GDH and Toxin A/B     Status: None   Collection Time: 07/09/20  2:36 PM  Result Value Ref Range   MICRO NUMBER: [id number]    SPECIMEN QUALITY: Adequate  Source STOOL    STATUS: FINAL    GDH ANTIGEN Not Detected    TOXIN A AND B Not Detected    COMMENT      No toxigenic C. difficile detected For additional information, please refer to http://education.[facility].com/faq/FAQ136 (This link is being provided for informational/educational purposes only.)    RADIOGRAPHIC STUDIES: I have personally reviewed the radiological images as listed and agreed with the findings in the report. No results found.  ASSESSMENT & PLAN:  Recurrent DVT: -First blood clot was in left upper extremity back in 2009????.  Unable to find imaging or medical results-we will see the patient can obtain. -Second blood clot was in September 2021 which initially showed superficial thrombus phlebitis-symptoms worsened and repeat imaging showed acute and extensive left lower extremity embolus.  He was started on Eliquis 5 mg twice daily. -Has CT scan of his chest with angiography to rule out pulmonary embolus by PCP scheduled for next month. -Has  family history of nonprovoked DVTs in several family members on his mother's side.  No coagulable work-up per patient. -No history of stroke or heart attack. -Bilateral knee replacements back in 2015 and bilateral shoulder replacement back in 2007.   Plan: Recurrent DVT: -Patient will likely be on anticoagulation for the rest of his life.  He is tolerating Eliquis 5 mg twice daily well. -No hypercoagulable work-up done to date. -Have asked that patient obtain imaging and medical records from [facility] to clarify initial DVT in left upper extremity.  If it is indeed unprovoked and a deep vein thrombus, patient does not need hypercoagulable work-up for hereditary mutations such as factor V Leiden.  -He is also currently on anticoagulation which needs that work-up may be skewed secondary to medication.  We will hold off at this time and obtain medical records. -We will have him return to clinic in about 4 months.  -He will continue Eliquis 5 mg twice daily.  Disposition: RTC in 4 months for follow-up. Addendum: I agree with HPI written by my nurse practitioner Faythe Casa, NP. This patient had to unprovoked episodes of DVT although the events preceding the first episode is not very clear.  Would agree with the recommendation of indefinite anticoagulation.  Would not recommend any further work-up for hypercoagulable state.  We will plan to repeat ultrasound in 4 to 6 months.  No problem-specific Assessment & Plan notes found for this encounter.   All questions were answered. The patient knows to call the clinic with any problems, questions or concerns.     Derek Jack, MD 07/28/20 7:41 PM

## 2020-07-28 DIAGNOSIS — I82409 Acute embolism and thrombosis of unspecified deep veins of unspecified lower extremity: Secondary | ICD-10-CM | POA: Insufficient documentation

## 2020-07-31 ENCOUNTER — Other Ambulatory Visit: Payer: Self-pay | Admitting: Gastroenterology

## 2020-07-31 LAB — BASIC METABOLIC PANEL
BUN/Creatinine Ratio: 9 (ref 9–20)
BUN: 7 mg/dL (ref 6–24)
CO2: 24 mmol/L (ref 20–29)
Calcium: 9.5 mg/dL (ref 8.7–10.2)
Chloride: 103 mmol/L (ref 96–106)
Creatinine, Ser: 0.77 mg/dL (ref 0.76–1.27)
GFR calc Af Amer: 118 mL/min/{1.73_m2} (ref >59)
GFR calc non Af Amer: 102 mL/min/{1.73_m2} (ref >59)
Glucose: 100 mg/dL — ABNORMAL HIGH (ref 65–99)
Potassium: 5 mmol/L (ref 3.5–5.2)
Sodium: 141 mmol/L (ref 134–144)

## 2020-08-05 ENCOUNTER — Telehealth (HOSPITAL_COMMUNITY): Payer: Self-pay | Admitting: Emergency Medicine

## 2020-08-05 NOTE — Telephone Encounter (Signed)
Reaching out to patient to offer assistance regarding upcoming cardiac imaging study; pt verbalizes understanding of appt date/time, parking situation and where to check in, pre-test NPO status and medications ordered, and verified current allergies; name and call back number provided for further questions should they arise [name] [phone] office [phone] cell   Pt uses medical transportation for appts. I told him to instruct them to drop him off at the main entrance of the hospital.  Pt verbalized understanding to take metoprolol and ivabraidne 2 hr prior to scan. [name]

## 2020-08-07 ENCOUNTER — Ambulatory Visit (HOSPITAL_COMMUNITY)
Admission: RE | Admit: 2020-08-07 | Discharge: 2020-08-07 | Disposition: A | Discharge status: Home / Self Care | Payer: Medicaid Other | Source: Ambulatory Visit | Attending: Internal Medicine | Admitting: Internal Medicine

## 2020-08-07 ENCOUNTER — Other Ambulatory Visit: Payer: Self-pay

## 2020-08-07 ENCOUNTER — Telehealth: Payer: Self-pay | Admitting: *Deleted

## 2020-08-07 DIAGNOSIS — R072 Precordial pain: Secondary | ICD-10-CM

## 2020-08-07 MED ORDER — IOHEXOL 350 MG/ML SOLN
80.0000 mL | Freq: Once | INTRAVENOUS | Status: AC | PRN
  Administered 2020-08-07: 80 mL via INTRAVENOUS

## 2020-08-07 MED ORDER — NITROGLYCERIN 0.4 MG SL SUBL
SUBLINGUAL_TABLET | SUBLINGUAL | Status: AC
  Filled 2020-08-07: qty 2

## 2020-08-07 MED ORDER — NITROGLYCERIN 0.4 MG SL SUBL
0.8000 mg | SUBLINGUAL_TABLET | Freq: Once | SUBLINGUAL | Status: AC
  Administered 2020-08-07: 0.8 mg via SUBLINGUAL

## 2020-08-07 NOTE — Telephone Encounter (Signed)
Received a call from radiology regarding the over read of the cardiac CT scan. Over read shows a PE. Message left for dr Margaretann Loveless to call me and results forwarded to her basket.

## 2020-08-07 NOTE — Telephone Encounter (Signed)
Ct results discussed with dr Margaretann Loveless. Patient is currently taking eliquis 5 mg twice daily for a DVT. Spoke with pt, aware of results and follow up scheduled with dr Margaretann Loveless on Monday 08/11/20 per her request.

## 2020-08-08 ENCOUNTER — Ambulatory Visit (HOSPITAL_COMMUNITY)
Admission: RE | Admit: 2020-08-08 | Discharge: 2020-08-08 | Disposition: A | Discharge status: Home / Self Care | Payer: Medicaid Other | Source: Ambulatory Visit | Attending: Internal Medicine | Admitting: Internal Medicine

## 2020-08-08 DIAGNOSIS — R072 Precordial pain: Secondary | ICD-10-CM | POA: Insufficient documentation

## 2020-08-10 DIAGNOSIS — R072 Precordial pain: Secondary | ICD-10-CM | POA: Diagnosis not present

## 2020-08-11 ENCOUNTER — Other Ambulatory Visit: Payer: Self-pay

## 2020-08-11 ENCOUNTER — Ambulatory Visit (INDEPENDENT_AMBULATORY_CARE_PROVIDER_SITE_OTHER): Payer: Medicaid Other | Admitting: Internal Medicine

## 2020-08-11 ENCOUNTER — Encounter: Payer: Self-pay | Admitting: Internal Medicine

## 2020-08-11 VITALS — BP 100/66 | HR 75 | Ht 66.0 in | Wt 205.4 lb

## 2020-08-11 DIAGNOSIS — R072 Precordial pain: Secondary | ICD-10-CM

## 2020-08-11 DIAGNOSIS — R0601 Orthopnea: Secondary | ICD-10-CM

## 2020-08-11 DIAGNOSIS — I2693 Single subsegmental pulmonary embolism without acute cor pulmonale: Secondary | ICD-10-CM

## 2020-08-11 DIAGNOSIS — R0609 Other forms of dyspnea: Secondary | ICD-10-CM

## 2020-08-11 DIAGNOSIS — E785 Hyperlipidemia, unspecified: Secondary | ICD-10-CM | POA: Diagnosis not present

## 2020-08-11 DIAGNOSIS — R0600 Dyspnea, unspecified: Secondary | ICD-10-CM

## 2020-08-11 DIAGNOSIS — I82409 Acute embolism and thrombosis of unspecified deep veins of unspecified lower extremity: Secondary | ICD-10-CM

## 2020-08-11 NOTE — Telephone Encounter (Signed)
Patient seen today 11/8 in clinic with Dr. Margaretann Loveless.

## 2020-08-11 NOTE — Progress Notes (Signed)
Cardiology Office Note:    Date:  08/11/2020   ID:  [name], DOB [birth year], MRN [id number]  PCP:  [name], MD  Cardiologist:  No primary care provider on file.  Electrophysiologist:  None   Referring MD: [name], MD   Chief Complaint/Reason for Referral: DOE  History of Present Illness:    [name] is a 54 y.o. male with a history of Crohn's disease with total colectomy and ileostomy in 2009, recent diagnosis of left lower extremity DVT historical documentation of pulmonary embolism, hyperlipidemia, legal blindness, schizophrenia who presents today for follow up evaluation of chest pain, shortness of breath.   He quit smoking yesterday evening and threw away 2 cartons of cigarettes.  I have commended him for this effort.  We discussed results of CCTA which showed nonocclusive pulmonary embolism likely secondary to his known recent DVT.  Only mild coronary artery disease on CCTA, no significant lesions on FFR.  We discussed that his symptoms are likely either secondary to resolving pulmonary embolism, or pulmonary disease from smoking.  He has not yet seen pulmonology, we have discussed considering this with his PCP at next visit on November 29.  He is scheduled for his echocardiogram on November 22, would recommend that he keep this appointment given shortness of breath.  Would like to evaluate right heart function in setting of recent PE.   Past Medical History:  Diagnosis Date  . Arthritis   . Asthma   . 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 Bipolar disorder ([place])   . Colostomy in place [facility])   . Crohn's colitis ([place])    s/p total colectomy with ileostomy in 2009  . Crohn's disease of small intestine [facility]) Sept 2012   ileoscopy: multiple ulcers likely secondary to Crohn's  . DEEP VENOUS THROMBOPHLEBITIS, HX OF 02/11/2009   Qualifier: Diagnosis of  By: Zeb Comfort    . Depression   . DVT (deep  venous thrombosis) ([place]) 2009   right upper extremity due to PICC  . Enterococcus UTI 2009  . GERD (gastroesophageal reflux disease)   . Hernia   . Hyperlipidemia   . Insomnia   . Migraine headache   . Nystagmus   . PULMONARY EMBOLISM, HX OF 02/11/2009   Qualifier: Diagnosis of  By: Zeb Comfort    . S/P endoscopy Sept 2012   mild gastritis, small hiatal hernia, no ulcers  . Schizophrenia ([place])   . Schizophrenia, acute [facility])     Past Surgical History:  Procedure Laterality Date  . APPENDECTOMY    . ESOPHAGOGASTRODUODENOSCOPY  07/2010   gastritis,  bx neg for H.Pylori  . ESOPHAGOGASTRODUODENOSCOPY N/A 01/08/2014   SLF:NO SOURCE FOR ODYNOPHAGIA IDENTIFIED/Multiple small ulcers in the gastric antrum  . EYE SURGERY    . ILEOSCOPY  06/29/2011   SLF: ulcers,multiple/small HH/mild gastritis  . KIDNEY SURGERY    . LAPAROTOMY N/A 06/05/2013   Procedure: EXPLORATORY LAPAROTOMY;  Surgeon: Donato Heinz, MD;  Location: [facility]S;  Service: General;  Laterality: N/A;  . LYSIS OF ADHESION N/A 06/05/2013   Procedure: LYSIS OF ADHESIONS;  Surgeon: Donato Heinz, MD;  Location: [facility] ORS;  Service: General;  Laterality: N/A;  . small bowel capsule study  08/2010   few tiny nonbleeding erosions/ulcers ?secondary to relafen or Crohn's. Entire small bowel not seen.   Marland Kitchen TOTAL COLECTOMY  2009   with ileostomy at [facility] for refractory disease   .  TOTAL HIP ARTHROPLASTY     right, due to avascular necrosis from chronic prednisone  . TOTAL SHOULDER REPLACEMENT     bilateral  . TRANSURETHRAL RESECTION OF PROSTATE N/A 12/03/2019   Procedure: TRANSURETHRAL RESECTION OF THE PROSTATE (TURP);  Surgeon: Cleon Gustin, MD;  Location: [facility] ORS;  Service: Urology;  Laterality: N/A;    Current Medications: Current Meds  Medication Sig  . ACCU-CHEK AVIVA PLUS test strip SMARTSIG:Via Meter  . acetaminophen (TYLENOL) 500 MG tablet Take 1,000 mg by mouth every 6 (six) hours as needed. For pain   . albuterol  (PROAIR HFA) 108 (90 BASE) MCG/ACT inhaler Inhale 2 puffs into the lungs every 6 (six) hours as needed for wheezing or shortness of breath.  Marland Kitchen albuterol (PROVENTIL) (2.5 MG/3ML) 0.083% nebulizer solution Take 2.5 mg by nebulization every 6 (six) hours as needed for wheezing or shortness of breath.  Marland Kitchen atorvastatin (LIPITOR) 80 MG tablet Take 80 mg by mouth daily.  Marland Kitchen azelastine (ASTELIN) 0.1 % nasal spray Place 2 sprays into both nostrils 2 (two) times daily.  . benztropine (COGENTIN) 0.5 MG tablet Take 0.5 mg by mouth daily.   Marland Kitchen CALCIUM 600/VITAMIN D3 600-800 MG-UNIT TABS TAKE (1) TABLET BY MOUTH THREE TIMES DAILY AFTER MEALS.  Steffanie Dunn 5 MG TABS tablet Take by mouth at bedtime.  . Cyanocobalamin (B-12) 1000 MCG CAPS Take by mouth daily.  Marland Kitchen dicyclomine (BENTYL) 10 MG capsule TAKE 1 OR 2 CAPSULE BY MOUTH AS DIRECTED BEFORE MEALS AND AT BEDTIME.  . divalproex (DEPAKOTE ER) 250 MG 24 hr tablet Take 250 mg by mouth daily.   . divalproex (DEPAKOTE) 500 MG 24 hr tablet Take 1,000 mg by mouth every evening.   . docusate sodium (COLACE) 100 MG capsule Take 1 capsule (100 mg total) by mouth 2 (two) times daily.  Marland Kitchen ELIQUIS 5 MG TABS tablet Take 5 mg by mouth 2 (two) times daily.  . folic acid (FOLVITE) 1 MG tablet Take 1 mg by mouth daily.    . furosemide (LASIX) 40 MG tablet Take 40 mg by mouth daily.    Marland Kitchen gabapentin (NEURONTIN) 300 MG capsule Take 900 mg by mouth 3 (three) times daily.   Marland Kitchen lidocaine (XYLOCAINE) 2 % solution 2 TSP  PO Q4-6H PRN FOR HEARTBURN OR UPPER ABDOMINAL PAIN  . meloxicam (MOBIC) 7.5 MG tablet Take 7.5 mg by mouth daily.  . mercaptopurine (PURINETHOL) 50 MG tablet TAKE 2 TABLETS DAILY ON EMPTY STOMACH 1 HOUR BEFORE OR 2 HOURS AFTER MEAL  . metFORMIN (GLUCOPHAGE) 500 MG tablet Take by mouth 2 (two) times daily with a meal.  . Misc. Devices ([place]) MISC USE AS DIRECTED  . Ostomy Supplies (ACTIVE LIFE 1-PC DRAIN [phone]) Pouch MISC   . pantoprazole (PROTONIX) 40 MG tablet  TAKE (1) TABLET BY MOUTH TWICE A DAY BEFORE MEALS. (BREAKFAST AND SUPPER)  . potassium chloride SA (K-DUR,KLOR-CON) 20 MEQ tablet Take 20 mEq by mouth daily.   . risperiDONE (RISPERDAL) 1 MG tablet Take 1 tablet by mouth at bedtime.  . risperiDONE (RISPERDAL) 2 MG tablet Take 2 mg by mouth at bedtime.   . sertraline (ZOLOFT) 50 MG tablet Take 50 mg by mouth daily.  . tamsulosin (FLOMAX) 0.4 MG CAPS capsule Take 0.4 mg by mouth daily.  . traZODone (DESYREL) 50 MG tablet Take 50 mg by mouth at bedtime.     Allergies:   Other, Aspirin, Bactrim [sulfamethoxazole-trimethoprim], Codeine, Ibuprofen, Oxycodone-acetaminophen, Penicillins, Remicade [infliximab], Tramadol, Tramadol hcl, and Vicodin [  hydrocodone-acetaminophen]   Social History   Tobacco Use  . Smoking status: Former Smoker    Packs/day: 1.00    Types: Cigarettes    Quit date: 08/10/2020  . Smokeless tobacco: Never Used  . Tobacco comment: one pack daily  Vaping Use  . Vaping Use: Never used  Substance Use Topics  . Alcohol use: No  . Drug use: No     Family History: The patient's family history includes Diabetes in his father and mother; Heart disease in his father and mother.  ROS:   Please see the history of present illness.    All other systems reviewed and are negative.  EKGs/Labs/Other Studies Reviewed:    The following studies were reviewed today:  EKG:  NSR, inferior infarct pattern  I have independently reviewed the images from CCTA/CT chest 08/07/2020.  Recent Labs: 12/04/2019: Hemoglobin 13.9; Platelets 167 07/31/2020: BUN 7; Creatinine, Ser 0.77; Potassium 5.0; Sodium 141  Recent Lipid Panel No results found for: CHOL, TRIG, HDL, CHOLHDL, VLDL, LDLCALC, LDLDIRECT  Physical Exam:    VS:  BP 100/66 (BP Location: Left Arm, Patient Position: Sitting)   Pulse 75   Ht 5' 6"  (1.676 m)   Wt 205 lb 6.4 oz (93.2 kg)   SpO2 95%   BMI 33.15 kg/m     Wt Readings from Last 5 Encounters:  08/11/20 205 lb 6.4 oz  (93.2 kg)  07/24/20 202 lb 1.6 oz (91.7 kg)  07/21/20 203 lb (92.1 kg)  03/05/20 214 lb (97.1 kg)  02/06/20 218 lb 3.2 oz (99 kg)    Constitutional: No acute distress Eyes: sclera non-icteric, normal conjunctiva and lids ENMT: normal dentition, moist mucous membranes Cardiovascular: regular rhythm, normal rate, no murmurs. S1 and S2 normal. Radial pulses normal bilaterally. No jugular venous distention.  Respiratory: Bilateral end expiratory wheezing GI : normal bowel sounds, soft and nontender. No distention.   MSK: extremities warm, well perfused. No edema.  NEURO: Nystagmus, moves all extremities. PSYCH: alert and oriented x 3, normal mood and affect.   ASSESSMENT:    1. Dyspnea on exertion   2. Precordial pain   3. Orthopnea   4. Hyperlipidemia, unspecified hyperlipidemia type   5. Recurrent deep vein thrombosis (DVT) (HCC)   6. Single subsegmental pulmonary embolism without acute cor pulmonale (HCC)    PLAN:    Dyspnea on exertion - Plan: EKG 12-Lead Precordial pain Orthopnea -He is still having dyspnea despite no obstructive lesion on CCTA.  We discussed that this could be related to pulmonary embolism, or may be due to lung disease from long-term smoking.  I would recommend he keep his echocardiogram to evaluate right heart size and function in the setting of recent PE and cardiovascular structure and function.  Hyperlipidemia, unspecified hyperlipidemia type - atorvastatin 80 mg daily  Recurrent deep vein thrombosis (DVT) (HCC) Single subsegmental pulmonary embolism without acute cor pulmonale (HCC) - PE noted on CCTA study. Patient tells me he has visited with hematology who recommend indefinite anticoagulation per his report. Agree with continuing apixaban 5 mg BID for recurrent DVT/PE without clear etiology on my review.  Total time of encounter: 30 minutes total time of encounter, including 20 minutes spent in face-to-face patient care on the date of this  encounter. This time includes coordination of care and counseling regarding above mentioned problem list. Remainder of non-face-to-face time involved reviewing chart documents/testing relevant to the patient encounter and documentation in the medical record. I have independently reviewed documentation from referring provider.  Cherlynn Kaiser, MD [place]  [facility]    Medication Adjustments/Labs and Tests Ordered: Current medicines are reviewed at length with the patient today.  Concerns regarding medicines are outlined above.   Orders Placed This Encounter  Procedures  . EKG 12-Lead    No orders of the defined types were placed in this encounter.   Patient Instructions  Medication Instructions:  No Changes In Medications at this time.  *If you need a refill on your cardiac medications before your next appointment, please call your pharmacy*  Lab Work: None Ordered At This Time.  If you have labs (blood work) drawn today and your tests are completely normal, you will receive your results only by: Marland Kitchen MyChart Message (if you have MyChart) OR . A paper copy in the mail If you have any lab test that is abnormal or we need to change your treatment, we will call you to review the results.  Testing/Procedures: KEEP CURRENT APPOINTMENT FOR ECHOCARDIOGRAM 08/25/20   Follow-Up: At [facility], you and your health needs are our priority.  As part of our continuing mission to provide you with exceptional heart care, we have created designated Provider Care Teams.  These Care Teams include your primary Cardiologist (physician) and Advanced Practice Providers (APPs -  Physician Assistants and Nurse Practitioners) who all work together to provide you with the care you need, when you need it.  We recommend signing up for the patient portal called "MyChart".  Sign up information is provided on this After Visit Summary.  MyChart is used to connect with patients for Virtual Visits  (Telemedicine).  Patients are able to view lab/test results, encounter notes, upcoming appointments, etc.  Non-urgent messages can be sent to your provider as well.   To learn more about what you can do with MyChart, go to [email or web address].    Your next appointment:   6 month(s)  The format for your next appointment:   In Person  Provider:   Cherlynn Kaiser, MD

## 2020-08-11 NOTE — Patient Instructions (Signed)
Medication Instructions:  No Changes In Medications at this time.  *If you need a refill on your cardiac medications before your next appointment, please call your pharmacy*  Lab Work: None Ordered At This Time.  If you have labs (blood work) drawn today and your tests are completely normal, you will receive your results only by: Marland Kitchen MyChart Message (if you have MyChart) OR . A paper copy in the mail If you have any lab test that is abnormal or we need to change your treatment, we will call you to review the results.  Testing/Procedures: KEEP CURRENT APPOINTMENT FOR ECHOCARDIOGRAM 08/25/20   Follow-Up: At [facility], you and your health needs are our priority.  As part of our continuing mission to provide you with exceptional heart care, we have created designated Provider Care Teams.  These Care Teams include your primary Cardiologist (physician) and Advanced Practice Providers (APPs -  Physician Assistants and Nurse Practitioners) who all work together to provide you with the care you need, when you need it.  We recommend signing up for the patient portal called "MyChart".  Sign up information is provided on this After Visit Summary.  MyChart is used to connect with patients for Virtual Visits (Telemedicine).  Patients are able to view lab/test results, encounter notes, upcoming appointments, etc.  Non-urgent messages can be sent to your provider as well.   To learn more about what you can do with MyChart, go to [email or web address].    Your next appointment:   6 month(s)  The format for your next appointment:   In Person  Provider:   Cherlynn Kaiser, MD

## 2020-08-14 ENCOUNTER — Ambulatory Visit: Payer: Medicaid Other | Admitting: Nurse Practitioner

## 2020-08-25 ENCOUNTER — Ambulatory Visit (HOSPITAL_COMMUNITY): Payer: Medicaid Other | Attending: Cardiology

## 2020-08-25 ENCOUNTER — Other Ambulatory Visit: Payer: Self-pay

## 2020-08-25 DIAGNOSIS — R072 Precordial pain: Secondary | ICD-10-CM | POA: Diagnosis not present

## 2020-08-25 DIAGNOSIS — R0609 Other forms of dyspnea: Secondary | ICD-10-CM

## 2020-08-25 DIAGNOSIS — R0601 Orthopnea: Secondary | ICD-10-CM | POA: Diagnosis not present

## 2020-08-25 DIAGNOSIS — R0600 Dyspnea, unspecified: Secondary | ICD-10-CM

## 2020-08-25 LAB — ECHOCARDIOGRAM COMPLETE
Area-P 1/2: 2.91 cm2
S' Lateral: 2.8 cm

## 2020-09-02 ENCOUNTER — Telehealth: Payer: Self-pay | Admitting: Internal Medicine

## 2020-09-02 DIAGNOSIS — R0683 Snoring: Secondary | ICD-10-CM

## 2020-09-02 NOTE — Telephone Encounter (Signed)
I called the patient this evening with results. Result note sent to JBullins RN.

## 2020-09-02 NOTE — Telephone Encounter (Signed)
informed patient   Awaiting for full result report from Dr Margaretann Loveless.  RN did inform  Patient some what reduce  Ejection fraction .  Patient will contact once  Final report  is given

## 2020-09-02 NOTE — Telephone Encounter (Signed)
° ° °  Pt is calling to get echo result

## 2020-09-03 NOTE — Addendum Note (Signed)
Addended by: Rexanne Mano B on: 09/03/2020 01:08 PM   Modules accepted: Orders

## 2020-09-03 NOTE — Telephone Encounter (Signed)
Spoke with patient, patient aware of Echo results. Advised patient that Dr. Margaretann Loveless would like for patient to have sleep study. Due to transportation issues patient would like to have home sleep study. Spoke with Dr. Margaretann Loveless who states that this is fine. Home sleep study ordered for Snoring. Advised patient that someone will reach out to him to schedule this. Also advised patient that Corlanor will be removed from medication list, patient states he has not been taking this medication. Patient verbalized understanding of all instructions.   Will send message to scheduler to schedule follow up appointment in 3 months.   Elouise Munroe, MD   Back to Top    I called the patient.  Rosalie Buenaventura, Please make him a follow up with me in 3 mo. Please schedule a sleep study for snoring.  Please remove Corlanor from his medication list, this was intended as a 1 time dose only pre CCTA. I confirmed with patient that he is not taking it daily.

## 2020-09-04 ENCOUNTER — Other Ambulatory Visit: Payer: Self-pay

## 2020-09-04 ENCOUNTER — Encounter: Payer: Self-pay | Admitting: Nurse Practitioner

## 2020-09-04 ENCOUNTER — Ambulatory Visit (INDEPENDENT_AMBULATORY_CARE_PROVIDER_SITE_OTHER): Payer: Medicaid Other | Admitting: Nurse Practitioner

## 2020-09-04 VITALS — BP 105/69 | HR 90 | Temp 97.3°F | Ht 66.0 in | Wt 184.8 lb

## 2020-09-04 DIAGNOSIS — R197 Diarrhea, unspecified: Secondary | ICD-10-CM | POA: Diagnosis not present

## 2020-09-04 DIAGNOSIS — K50819 Crohn's disease of both small and large intestine with unspecified complications: Secondary | ICD-10-CM

## 2020-09-04 DIAGNOSIS — R1084 Generalized abdominal pain: Secondary | ICD-10-CM | POA: Diagnosis not present

## 2020-09-04 DIAGNOSIS — R109 Unspecified abdominal pain: Secondary | ICD-10-CM | POA: Insufficient documentation

## 2020-09-04 NOTE — Patient Instructions (Signed)
Your health issues we discussed today were:   Diarrhea with a history of known Crohn's disease: 1. Have your stool studies completed when you are able to 2. Have your labs completed when you are able to 3. We will call you with your lab results and if there is no infection we can make recommendations for medicines to help with your loose stools 4. While you are having loose stools, stop taking Colace 5. Call us if you have any significant worsening or if you have no improvement in 3 to 5 days  Overall I recommend:  1. Continue your other current medications 2. Return for follow-up in 2 months 3. Call us for any questions or concerns   ---------------------------------------------------------------  I am glad you have gotten your COVID-19 vaccination!  Even though you are fully vaccinated you should continue to follow CDC and state/local guidelines.  ---------------------------------------------------------------   At [facility] we value your feedback. You may receive a survey about your visit today. Please share your experience as we strive to create trusting relationships with our patients to provide genuine, compassionate, quality care.  We appreciate your understanding and patience as we review any laboratory studies, imaging, and other diagnostic tests that are ordered as we care for you. Our office policy is 5 business days for review of these results, and any emergent or urgent results are addressed in a timely manner for your best interest. If you do not hear from our office in 1 week, please contact us.   We also encourage the use of MyChart, which contains your medical information for your review as well. If you are not enrolled in this feature, an access code is on this after visit summary for your convenience. Thank you for allowing [place] to be involved in your care.  It was great to see you today!  I hope you have a Merry Christmas and Happy Holidays!!

## 2020-09-04 NOTE — Progress Notes (Signed)
Referring Provider: [name], MD Primary Care Physician:  [name], MD Primary GI:  Dr. Abbey Chatters  Chief Complaint  Patient presents with  . Follow-up  . Diarrhea    HPI:   [name] is a 54 y.o. male who presents for follow-up.  The patient was last seen in our office 02/06/2020 for Crohn's disease and at risk for osteopenia.  At that time noted need for walker, saw neurology no nerve damage.  Trying to eat healthier.  Empties his bag 4 times a day that is generally solid unless he drinks coffee.  Last episode of diarrhea was occurring about once a week.  No other overt GI complaints.  Last DEXA scan in 2017 with a prior history of prolonged exposure to steroids and recommended repeat in 2021.  Recommend continue mercaptopurine, complete DEXA scan, follow-up in 6 months.  Today states doing okay overall. He has had diarrhea this morning. Previously generally formed. No diet changes, still on low calorie diet/eating healthier. Drinks bottled water at home. Denies any recent antibiotics. On September 16 he was in the hospital for DVT and PE. 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 No other new medications. Still taking Mercaptopurine. Denies hematochezia, melena. Started having mild mid-abdominal pain a couple days ago, not severe but rather "bearable and just annoying." Denies rashes, new joint pains, mouth sores. Denies N/V, fever, chills, unintentional weight loss. Denies URI or flu-like symptoms. Denies loss of sense of taste or smell. The patient has received COVID-19 vaccination(s). Denies chest pain, dyspnea, dizziness, lightheadedness, syncope, near syncope. Denies any other upper or lower GI symptoms.  Still on Colace, last dose last night.  Past Medical History:  Diagnosis Date  . Arthritis   . Asthma   . 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 Bipolar disorder ([place])   . Colostomy in place [facility])   .  Crohn's colitis ([place])    s/p total colectomy with ileostomy in 2009  . Crohn's disease of small intestine [facility]) Sept 2012   ileoscopy: multiple ulcers likely secondary to Crohn's  . DEEP VENOUS THROMBOPHLEBITIS, HX OF 02/11/2009   Qualifier: Diagnosis of  By: Zeb Comfort    . Depression   . DVT (deep venous thrombosis) ([place]) 2009   right upper extremity due to PICC  . Enterococcus UTI 2009  . GERD (gastroesophageal reflux disease)   . Hernia   . Hyperlipidemia   . Insomnia   . Migraine headache   . Nystagmus   . PULMONARY EMBOLISM, HX OF 02/11/2009   Qualifier: Diagnosis of  By: Zeb Comfort    . S/P endoscopy Sept 2012   mild gastritis, small hiatal hernia, no ulcers  . Schizophrenia ([place])   . Schizophrenia, acute [facility])     Past Surgical History:  Procedure Laterality Date  . APPENDECTOMY    . ESOPHAGOGASTRODUODENOSCOPY  07/2010   gastritis,  bx neg for H.Pylori  . ESOPHAGOGASTRODUODENOSCOPY N/A 01/08/2014   SLF:NO SOURCE FOR ODYNOPHAGIA IDENTIFIED/Multiple small ulcers in the gastric antrum  . EYE SURGERY    . ILEOSCOPY  06/29/2011   SLF: ulcers,multiple/small HH/mild gastritis  . KIDNEY SURGERY    . LAPAROTOMY N/A 06/05/2013   Procedure: EXPLORATORY LAPAROTOMY;  Surgeon: Donato Heinz, MD;  Location: [facility] ORS;  Service: General;  Laterality: N/A;  . LYSIS OF ADHESION N/A 06/05/2013   Procedure: LYSIS OF ADHESIONS;  Surgeon: Donato Heinz,  MD;  Location: [facility] ORS;  Service: General;  Laterality: N/A;  . small bowel capsule study  08/2010   few tiny nonbleeding erosions/ulcers ?secondary to relafen or Crohn's. Entire small bowel not seen.   Marland Kitchen TOTAL COLECTOMY  2009   with ileostomy at [facility] for refractory disease   . TOTAL HIP ARTHROPLASTY     right, due to avascular necrosis from chronic prednisone  . TOTAL SHOULDER REPLACEMENT     bilateral  . TRANSURETHRAL RESECTION OF PROSTATE N/A 12/03/2019   Procedure: TRANSURETHRAL RESECTION OF THE PROSTATE (TURP);  Surgeon:  Cleon Gustin, MD;  Location: [facility]S;  Service: Urology;  Laterality: N/A;    Current Outpatient Medications  Medication Sig Dispense Refill  . ACCU-CHEK AVIVA PLUS test strip SMARTSIG:Via Meter    . acetaminophen (TYLENOL) 500 MG tablet Take 1,000 mg by mouth every 6 (six) hours as needed. For pain     . albuterol (PROAIR HFA) 108 (90 BASE) MCG/ACT inhaler Inhale 2 puffs into the lungs every 6 (six) hours as needed for wheezing or shortness of breath.    Marland Kitchen albuterol (PROVENTIL) (2.5 MG/3ML) 0.083% nebulizer solution Take 2.5 mg by nebulization every 6 (six) hours as needed for wheezing or shortness of breath.    Marland Kitchen atorvastatin (LIPITOR) 80 MG tablet Take 80 mg by mouth daily.    Marland Kitchen azelastine (ASTELIN) 0.1 % nasal spray Place 2 sprays into both nostrils 2 (two) times daily.    . benztropine (COGENTIN) 0.5 MG tablet Take 0.5 mg by mouth daily.     Marland Kitchen CALCIUM 600/VITAMIN D3 600-800 MG-UNIT TABS TAKE (1) TABLET BY MOUTH THREE TIMES DAILY AFTER MEALS. 84 tablet 5  . Cyanocobalamin (B-12) 1000 MCG CAPS Take by mouth daily.    Marland Kitchen dicyclomine (BENTYL) 10 MG capsule TAKE 1 OR 2 CAPSULE BY MOUTH AS DIRECTED BEFORE MEALS AND AT BEDTIME. 60 capsule 3  . divalproex (DEPAKOTE ER) 250 MG 24 hr tablet Take 250 mg by mouth daily.     . divalproex (DEPAKOTE) 500 MG 24 hr tablet Take 1,000 mg by mouth every evening.     . docusate sodium (COLACE) 100 MG capsule Take 1 capsule (100 mg total) by mouth 2 (two) times daily. 10 capsule 0  . ELIQUIS 5 MG TABS tablet Take 5 mg by mouth 2 (two) times daily.    . folic acid (FOLVITE) 1 MG tablet Take 1 mg by mouth daily.      . furosemide (LASIX) 40 MG tablet Take 40 mg by mouth daily.      Marland Kitchen gabapentin (NEURONTIN) 300 MG capsule Take 900 mg by mouth 3 (three) times daily.     Marland Kitchen lidocaine (XYLOCAINE) 2 % solution 2 TSP  PO Q4-6H PRN FOR HEARTBURN OR UPPER ABDOMINAL PAIN 300 mL 11  . meloxicam (MOBIC) 7.5 MG tablet Take 7.5 mg by mouth daily.    . mercaptopurine  (PURINETHOL) 50 MG tablet TAKE 2 TABLETS DAILY ON EMPTY STOMACH 1 HOUR BEFORE OR 2 HOURS AFTER MEAL 60 tablet 11  . metFORMIN (GLUCOPHAGE) 500 MG tablet Take by mouth 2 (two) times daily with a meal.    . mirtazapine (REMERON) 15 MG tablet Take 15 mg by mouth at bedtime.     . Misc. Devices (WALKER WHEELS) MISC USE AS DIRECTED 1 each 0  . Ostomy Supplies (ACTIVE LIFE 1-PC DRAIN [phone]) Pouch MISC     . pantoprazole (PROTONIX) 40 MG tablet TAKE (1) TABLET BY MOUTH TWICE A DAY BEFORE  MEALS. (BREAKFAST AND SUPPER) 56 tablet 11  . potassium chloride SA (K-DUR,KLOR-CON) 20 MEQ tablet Take 20 mEq by mouth daily.     . risperiDONE (RISPERDAL) 1 MG tablet Take 1 tablet by mouth at bedtime.    . risperiDONE (RISPERDAL) 2 MG tablet Take 2 mg by mouth at bedtime.     . sertraline (ZOLOFT) 50 MG tablet Take 50 mg by mouth daily.    . tamsulosin (FLOMAX) 0.4 MG CAPS capsule Take 0.4 mg by mouth daily.    . traZODone (DESYREL) 50 MG tablet Take 50 mg by mouth at bedtime.    . metoprolol tartrate (LOPRESSOR) 50 MG tablet Take 1 tablet (50 mg total) by mouth once for 1 dose. PLEASE TAKE 10m (1 TABLET) 1 HOUR PRIOR TO CTA SCAN 1 tablet 0   No current facility-administered medications for this visit.    Allergies as of 09/04/2020 - Review Complete 09/04/2020  Allergen Reaction Noted  . Other Shortness Of Breath 04/17/2012  . Aspirin    . Bactrim [sulfamethoxazole-trimethoprim]  11/07/2013  . Codeine    . Ibuprofen    . Oxycodone-acetaminophen Other (See Comments) 04/17/2012  . Penicillins    . Remicade [infliximab]  04/18/2013  . Tramadol Nausea Only 11/04/2011  . Tramadol hcl    . Vicodin [hydrocodone-acetaminophen] Nausea Only 05/17/2011    Family History  Problem Relation Age of Onset  . Diabetes Mother   . Heart disease Mother   . Diabetes Father   . Heart disease Father   . Colon cancer Neg Hx     Social History   Socioeconomic History  . Marital status: Single    Spouse name: Not  on file  . Number of children: Not on file  . Years of education: Not on file  . Highest education level: Not on file  Occupational History  . Not on file  Tobacco Use  . Smoking status: Current Every Day Smoker    Packs/day: 0.10    Types: Cigarettes    Last attempt to quit: 08/10/2020    Years since quitting: 0.0  . Smokeless tobacco: Never Used  . Tobacco comment: down to 3 ciggs a day from 2 packs a day  Vaping Use  . Vaping Use: Never used  Substance and Sexual Activity  . Alcohol use: No  . Drug use: No  . Sexual activity: Never  Other Topics Concern  . Not on file  Social History Narrative   Girlfriend of six years, Rectal Cancer on Hospice (05/2011).   Social Determinants of Health   Financial Resource Strain:   . Difficulty of Paying Living Expenses: Not on file  Food Insecurity:   . Worried About RCharity fundraiserin the Last Year: Not on file  . Ran Out of Food in the Last Year: Not on file  Transportation Needs:   . Lack of Transportation (Medical): Not on file  . Lack of Transportation (Non-Medical): Not on file  Physical Activity:   . Days of Exercise per Week: Not on file  . Minutes of Exercise per Session: Not on file  Stress:   . Feeling of Stress : Not on file  Social Connections:   . Frequency of Communication with Friends and Family: Not on file  . Frequency of Social Gatherings with Friends and Family: Not on file  . Attends Religious Services: Not on file  . Active Member of Clubs or Organizations: Not on file  . Attends CArchivistMeetings:  Not on file  . Marital Status: Not on file    Subjective: Review of Systems  Constitutional: Negative for chills, fever, malaise/fatigue and weight loss.  HENT: Negative for congestion and sore throat.   Respiratory: Negative for cough and shortness of breath.   Cardiovascular: Negative for chest pain and palpitations.  Gastrointestinal: Positive for abdominal pain and diarrhea. Negative for  blood in stool, constipation, heartburn, melena, nausea and vomiting.  Musculoskeletal: Negative for joint pain and myalgias.  Skin: Negative for rash.  Neurological: Negative for dizziness and weakness.  Endo/Heme/Allergies: Does not bruise/bleed easily.  Psychiatric/Behavioral: Negative for depression. The patient is not nervous/anxious.   All other systems reviewed and are negative.    Objective: BP 105/69   Pulse 90   Temp (!) 97.3 F (36.3 C) (Temporal)   Ht 5' 6"  (1.676 m)   Wt 184 lb 12.8 oz (83.8 kg)   BMI 29.83 kg/m  Physical Exam Vitals and nursing note reviewed.  Constitutional:      General: He is not in acute distress.    Appearance: Normal appearance. He is normal weight. He is not ill-appearing, toxic-appearing or diaphoretic.  HENT:     Head: Normocephalic and atraumatic.     Nose: No congestion or rhinorrhea.  Eyes:     General: No scleral icterus. Cardiovascular:     Rate and Rhythm: Normal rate and regular rhythm.     Heart sounds: Normal heart sounds.  Pulmonary:     Effort: Pulmonary effort is normal.     Breath sounds: Examination of the right-lower field reveals wheezing. Examination of the left-lower field reveals wheezing. Wheezing present.     Comments: Mild end expiratory wheezes Abdominal:     General: Bowel sounds are normal. There is no distension.     Palpations: Abdomen is soft. There is no hepatomegaly, splenomegaly or mass.     Tenderness: There is no abdominal tenderness. There is no guarding or rebound.     Hernia: No hernia is present.     Comments: Ostomy back in place right anterior abdomen with liquid stool, brown and without hematochezia or melena  Musculoskeletal:     Cervical back: Neck supple.  Skin:    General: Skin is warm and dry.     Coloration: Skin is not jaundiced.     Findings: No bruising or rash.  Neurological:     General: No focal deficit present.     Mental Status: He is alert and oriented to person, place, and  time. Mental status is at baseline.  Psychiatric:        Mood and Affect: Mood normal.        Behavior: Behavior normal.        Thought Content: Thought content normal.      Assessment:  Very pleasant 54 year old male with a history of Crohn's disease status post total colectomy.  He currently has an ostomy bag.  Typically has more solid-ish stools, unless he drinks coffee.  However, this morning he began having diarrhea.  His ostomy bag does indeed have diarrhea, though not large volume.  Some very mild abdominal discomfort but no severe pain and he states this is tolerable.  No new medications, recent sick contacts, antibiotics, recent hospital/healthcare exposure.  No red flag/warning signs or symptoms.  Not feverish.  Diarrhea with history of Crohn's disease: Differentials are actually quite broad.  I am not so convinced that he is having a flareup or worsening of his Crohn's disease.  However, I will check a CBC, CMP, CRP, sed rate to evaluate for worsening inflammation that could point more towards Crohn's.  Also possible viral gastroenteritis or other bacterial infection.  We will plan for stool studies in addition to his labs.  I will have him call if there is no improvement in 3 to 5 days or if you become significantly worse.  If stool studies are negative we can consider medications as Bentyl, Imodium, Lomotil.   Plan: 1. Stool studies including C. difficile and GI path panel 2. Labs including CBC, CMP, CRP, ESR 3. Call for any worsening or if no improvement 3 to 5 days 4. Try to avoid excessive amounts of coffee for now 5. Hold Colace for now 6. If stool studies negative consider Bentyl versus Imodium versus Lomotil 7. Follow-up in 2 months    Thank you for allowing [place] to participate in the care of [place], DNP, AGNP-C Adult & Gerontological Nurse Practitioner [facility]   09/04/2020 4:21 PM   Disclaimer: This note was  dictated with voice recognition software. Similar sounding words can inadvertently be transcribed and may not be corrected upon review.

## 2020-09-08 ENCOUNTER — Ambulatory Visit: Payer: Medicaid Other | Admitting: Internal Medicine

## 2020-09-09 ENCOUNTER — Telehealth: Payer: Self-pay | Admitting: Internal Medicine

## 2020-09-09 NOTE — Telephone Encounter (Signed)
Pt c/o of Chest Pain: STAT if CP now or developed within 24 hours  1. Are you having CP right now? no  2. Are you experiencing any other symptoms (ex. SOB, nausea, vomiting, sweating)? SOB sometimes but not now  3. How long have you been experiencing CP? 3 days ago  4. Is your CP continuous or coming and going? comes and goes  5. Have you taken Nitroglycerin? No   Patient states he has been getting chest pain that started 3 days ago. He states he is not having any chest pain now. He states he also gets SOB sometimes, but is not SOB now. Please advise. ?

## 2020-09-09 NOTE — Telephone Encounter (Signed)
The patient states he has been experiencing increasing periods of shortness of breath and chest pain since his last office visit with Dr. Margaretann Loveless. Shortness of breath and chest pain do not necessarily occur at the same time. He says he notices the shortness of breath most in the mornings and evenings and notices the chest pains more during the day. The patient states his chest discomfort is in the center of his chest and can feel "like an elephant is standing on my chest." This happens at rest and occurs daily or every other day, lasting for 4 hours or more at a time. On Sunday, for example, the patient states his chest pain lasted 8 full hours. States the shortness of breath is limiting his ability to perform normal activities throughout the day, stating he needs to stop and rest after walking only about 20 feet.   During our call, the pt is not short of breath or experiencing chest pain. Advised the patient to seek urgent evaluation for acutely worsening chest pain or shortness of breath, particularly in the presence of jaw/arm pain, nausea, etc. Reassured pt that recent CCTA did not show occlusive disease. The patient verbalizes understanding.   The patient states he saw his PCP yesterday where these issues were discussed, but was not given any specific recommendations. He states he did not discuss a referral to a pulmonologist per Dr. Delphina Cahill recommendation at last office visit. RN encouraged pt to contact PCP and seek out this referral, and the pt stated he would do so as soon as our conversation concluded. Also advised the pt to continue to take all his medications as prescribed, emphasizing his Eliquis in the setting of a known PE.   Will forward to MD.

## 2020-09-09 NOTE — Telephone Encounter (Signed)
Agree with your recommendations, Judson Roch. If this happens again, he needs to go to the ED. This could be recurrent/progressive DVT/PE. We also can't exclude acute coronary syndrome with those symptoms. Would encourage him to call EMS or go to ED if recurrent symptoms.

## 2020-09-10 ENCOUNTER — Ambulatory Visit: Payer: Medicaid Other | Admitting: Urology

## 2020-09-10 NOTE — Telephone Encounter (Signed)
Patient is returning call.  °

## 2020-09-10 NOTE — Telephone Encounter (Signed)
Advised patient, verbalized understanding  

## 2020-09-10 NOTE — Telephone Encounter (Signed)
Attempted to call patient, left message for patient to call back to office.   

## 2020-10-11 LAB — CBC WITH DIFFERENTIAL/PLATELET
Absolute Monocytes: 319 cells/uL (ref 200–950)
Basophils Absolute: 11 cells/uL (ref 0–200)
Basophils Relative: 0.3 %
Eosinophils Absolute: 140 cells/uL (ref 15–500)
Eosinophils Relative: 4 %
HCT: 40.9 % (ref 38.5–50.0)
Hemoglobin: 14.4 g/dL (ref 13.2–17.1)
Lymphs Abs: 1061 cells/uL (ref 850–3900)
MCH: 36.7 pg — ABNORMAL HIGH (ref 27.0–33.0)
MCHC: 35.2 g/dL (ref 32.0–36.0)
MCV: 104.3 fL — ABNORMAL HIGH (ref 80.0–100.0)
MPV: 11.8 fL (ref 7.5–12.5)
Monocytes Relative: 9.1 %
Neutro Abs: 1971 cells/uL (ref 1500–7800)
Neutrophils Relative %: 56.3 %
Platelets: 141 10*3/uL (ref 140–400)
RBC: 3.92 10*6/uL — ABNORMAL LOW (ref 4.20–5.80)
RDW: 15.1 % — ABNORMAL HIGH (ref 11.0–15.0)
Total Lymphocyte: 30.3 %
WBC: 3.5 10*3/uL — ABNORMAL LOW (ref 3.8–10.8)

## 2020-10-11 LAB — SEDIMENTATION RATE: Sed Rate: 2 mm/h (ref 0–20)

## 2020-10-11 LAB — COMPREHENSIVE METABOLIC PANEL
AG Ratio: 1.6 (calc) (ref 1.0–2.5)
ALT: 46 U/L (ref 9–46)
AST: 30 U/L (ref 10–35)
Albumin: 4.2 g/dL (ref 3.6–5.1)
Alkaline phosphatase (APISO): 67 U/L (ref 35–144)
BUN: 11 mg/dL (ref 7–25)
CO2: 33 mmol/L — ABNORMAL HIGH (ref 20–32)
Calcium: 10.1 mg/dL (ref 8.6–10.3)
Chloride: 99 mmol/L (ref 98–110)
Creat: 0.75 mg/dL (ref 0.70–1.33)
Globulin: 2.6 g/dL (calc) (ref 1.9–3.7)
Glucose, Bld: 127 mg/dL (ref 65–139)
Potassium: 4.9 mmol/L (ref 3.5–5.3)
Sodium: 139 mmol/L (ref 135–146)
Total Bilirubin: 0.6 mg/dL (ref 0.2–1.2)
Total Protein: 6.8 g/dL (ref 6.1–8.1)

## 2020-10-11 LAB — C-REACTIVE PROTEIN: CRP: 5 mg/L (ref <8.0)

## 2020-10-17 ENCOUNTER — Encounter: Payer: Self-pay | Admitting: Internal Medicine

## 2020-10-17 ENCOUNTER — Other Ambulatory Visit: Payer: Self-pay

## 2020-10-17 ENCOUNTER — Ambulatory Visit (INDEPENDENT_AMBULATORY_CARE_PROVIDER_SITE_OTHER): Payer: Medicaid Other | Admitting: Internal Medicine

## 2020-10-17 ENCOUNTER — Ambulatory Visit (INDEPENDENT_AMBULATORY_CARE_PROVIDER_SITE_OTHER): Payer: Medicaid Other

## 2020-10-17 VITALS — BP 96/60 | HR 74 | Ht 66.0 in | Wt 202.3 lb

## 2020-10-17 DIAGNOSIS — R072 Precordial pain: Secondary | ICD-10-CM

## 2020-10-17 DIAGNOSIS — R062 Wheezing: Secondary | ICD-10-CM

## 2020-10-17 DIAGNOSIS — R002 Palpitations: Secondary | ICD-10-CM

## 2020-10-17 DIAGNOSIS — R0600 Dyspnea, unspecified: Secondary | ICD-10-CM

## 2020-10-17 DIAGNOSIS — Z87891 Personal history of nicotine dependence: Secondary | ICD-10-CM | POA: Diagnosis not present

## 2020-10-17 DIAGNOSIS — R0609 Other forms of dyspnea: Secondary | ICD-10-CM

## 2020-10-17 NOTE — Progress Notes (Unsigned)
Cardiology Office Note:    Date:  10/17/2020   ID:  [name], DOB Feb 08, 1966, MRN [id number]  PCP:  [name], MD  Cardiologist:  No primary care provider on file.  Electrophysiologist:  None   Referring MD: [name], MD   Chief Complaint/Reason for Referral: DOE  History of Present Illness:    [name] is a 55 y.o. male with a history of Crohn's disease with total colectomy and ileostomy in 2009, recent diagnosis of left lower extremity DVT historical documentation of pulmonary embolism, hyperlipidemia, legal blindness, schizophrenia who presents today for follow up evaluation of chest pain, shortness of breath.   Continues to have dyspnea. We discussed referral to pulmonary. Snores, and we have referred him for sleep study.   Called into the office last month with chest pain for 3 days, with continued SOB. Felt like elephant on his chest. Daily symptoms. DOE with only 20 ft of walking. We reviewed no obstructive CAD on recent CCTA. Reviewed recent PE and continued anticoagulation given unprovoked events.   Also describes frequent palpitations, several times a week. No lightheadedness or dizziness.    Past Medical History:  Diagnosis Date  . Arthritis   . Asthma   . 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 Bipolar disorder ([place])   . Colostomy in place [facility])   . Crohn's colitis ([place])    s/p total colectomy with ileostomy in 2009  . Crohn's disease of small intestine [facility]) Sept 2012   ileoscopy: multiple ulcers likely secondary to Crohn's  . DEEP VENOUS THROMBOPHLEBITIS, HX OF 02/11/2009   Qualifier: Diagnosis of  By: Zeb Comfort    . Depression   . DVT (deep venous thrombosis) ([place]) 2009   right upper extremity due to PICC  . Enterococcus UTI 2009  . GERD (gastroesophageal reflux disease)   . Hernia   . Hyperlipidemia   . Insomnia   . Migraine headache   . Nystagmus   . PULMONARY EMBOLISM, HX OF  02/11/2009   Qualifier: Diagnosis of  By: Zeb Comfort    . S/P endoscopy Sept 2012   mild gastritis, small hiatal hernia, no ulcers  . Schizophrenia ([place])   . Schizophrenia, acute [facility])     Past Surgical History:  Procedure Laterality Date  . APPENDECTOMY    . ESOPHAGOGASTRODUODENOSCOPY  07/2010   gastritis,  bx neg for H.Pylori  . ESOPHAGOGASTRODUODENOSCOPY N/A 01/08/2014   SLF:NO SOURCE FOR ODYNOPHAGIA IDENTIFIED/Multiple small ulcers in the gastric antrum  . EYE SURGERY    . ILEOSCOPY  06/29/2011   SLF: ulcers,multiple/small HH/mild gastritis  . KIDNEY SURGERY    . LAPAROTOMY N/A 06/05/2013   Procedure: EXPLORATORY LAPAROTOMY;  Surgeon: Donato Heinz, MD;  Location: [facility]S;  Service: General;  Laterality: N/A;  . LYSIS OF ADHESION N/A 06/05/2013   Procedure: LYSIS OF ADHESIONS;  Surgeon: Donato Heinz, MD;  Location: [facility] ORS;  Service: General;  Laterality: N/A;  . small bowel capsule study  08/2010   few tiny nonbleeding erosions/ulcers ?secondary to relafen or Crohn's. Entire small bowel not seen.   Marland Kitchen TOTAL COLECTOMY  2009   with ileostomy at [facility] for refractory disease   . TOTAL HIP ARTHROPLASTY     right, due to avascular necrosis from chronic prednisone  . TOTAL SHOULDER REPLACEMENT     bilateral  . TRANSURETHRAL RESECTION OF PROSTATE N/A 12/03/2019   Procedure: TRANSURETHRAL RESECTION OF THE  PROSTATE (TURP);  Surgeon: Cleon Gustin, MD;  Location: [facility] OR[facility];  Service: Urology;  Laterality: N/A;    Current Medications: Current Meds  Medication Sig  . ACCU-CHEK AVIVA PLUS test strip SMARTSIG:Via Meter  . acetaminophen (TYLENOL) 500 MG tablet Take 1,000 mg by mouth every 6 (six) hours as needed. For pain  . albuterol (PROVENTIL) (2.5 MG/3ML) 0.083% nebulizer solution Take 2.5 mg by nebulization every 6 (six) hours as needed for wheezing or shortness of breath.  Marland Kitchen albuterol (VENTOLIN HFA) 108 (90 Base) MCG/ACT inhaler Inhale 2 puffs into the lungs every 6 (six) hours  as needed for wheezing or shortness of breath.  Marland Kitchen atorvastatin (LIPITOR) 80 MG tablet Take 80 mg by mouth daily.  Marland Kitchen azelastine (ASTELIN) 0.1 % nasal spray Place 2 sprays into both nostrils 2 (two) times daily.  . benztropine (COGENTIN) 0.5 MG tablet Take 0.5 mg by mouth daily.   Marland Kitchen CALCIUM 600/VITAMIN D3 600-800 MG-UNIT TABS TAKE (1) TABLET BY MOUTH THREE TIMES DAILY AFTER MEALS.  Marland Kitchen Cyanocobalamin (B-12) 1000 MCG CAPS Take by mouth daily.  Marland Kitchen dicyclomine (BENTYL) 10 MG capsule TAKE 1 OR 2 CAPSULE BY MOUTH AS DIRECTED BEFORE MEALS AND AT BEDTIME.  . divalproex (DEPAKOTE ER) 250 MG 24 hr tablet Take 250 mg by mouth daily.   . divalproex (DEPAKOTE) 500 MG 24 hr tablet Take 1,000 mg by mouth every evening.  . docusate sodium (COLACE) 100 MG capsule Take 1 capsule (100 mg total) by mouth 2 (two) times daily.  Marland Kitchen ELIQUIS 5 MG TABS tablet Take 5 mg by mouth 2 (two) times daily.  . folic acid (FOLVITE) 1 MG tablet Take 1 mg by mouth daily.  . furosemide (LASIX) 40 MG tablet Take 40 mg by mouth daily.  Marland Kitchen gabapentin (NEURONTIN) 300 MG capsule Take 900 mg by mouth 3 (three) times daily.  Marland Kitchen lidocaine (XYLOCAINE) 2 % solution 2 TSP  PO Q4-6H PRN FOR HEARTBURN OR UPPER ABDOMINAL PAIN  . meloxicam (MOBIC) 7.5 MG tablet Take 7.5 mg by mouth daily.  . mercaptopurine (PURINETHOL) 50 MG tablet TAKE 2 TABLETS DAILY ON EMPTY STOMACH 1 HOUR BEFORE OR 2 HOURS AFTER MEAL  . metFORMIN (GLUCOPHAGE) 500 MG tablet Take by mouth 2 (two) times daily with a meal.  . mirtazapine (REMERON) 15 MG tablet Take 15 mg by mouth at bedtime.   . Misc. Devices ([place]) MISC USE AS DIRECTED  . Ostomy Supplies (ACTIVE LIFE 1-PC DRAIN [phone]) Pouch MISC   . pantoprazole (PROTONIX) 40 MG tablet TAKE (1) TABLET BY MOUTH TWICE A DAY BEFORE MEALS. (BREAKFAST AND SUPPER)  . potassium chloride SA (K-DUR,KLOR-CON) 20 MEQ tablet Take 20 mEq by mouth daily.   . risperiDONE (RISPERDAL) 1 MG tablet Take 1 tablet by mouth at bedtime.  .  risperiDONE (RISPERDAL) 2 MG tablet Take 2 mg by mouth at bedtime.   . sertraline (ZOLOFT) 50 MG tablet Take 50 mg by mouth daily.  . tamsulosin (FLOMAX) 0.4 MG CAPS capsule Take 0.4 mg by mouth daily.  . traZODone (DESYREL) 50 MG tablet Take 50 mg by mouth at bedtime.     Allergies:   Other, Aspirin, Bactrim [sulfamethoxazole-trimethoprim], Codeine, Ibuprofen, Oxycodone-acetaminophen, Penicillins, Remicade [infliximab], Tramadol, Tramadol hcl, and Vicodin [hydrocodone-acetaminophen]   Social History   Tobacco Use  . Smoking status: Current Every Day Smoker    Packs/day: 0.10    Types: Cigarettes    Last attempt to quit: 08/10/2020    Years since quitting: 0.1  .  Smokeless tobacco: Never Used  . Tobacco comment: down to 3 ciggs a day from 2 packs a day  Vaping Use  . Vaping Use: Never used  Substance Use Topics  . Alcohol use: No  . Drug use: No     Family History: The patient's family history includes Diabetes in his father and mother; Heart disease in his father and mother. There is no history of Colon cancer.  ROS:   Please see the history of present illness.    All other systems reviewed and are negative.  EKGs/Labs/Other Studies Reviewed:    The following studies were reviewed today:  EKG:  NSR   Recent Labs: 10/10/2020: ALT 46; BUN 11; Creat 0.75; Hemoglobin 14.4; Platelets 141; Potassium 4.9; Sodium 139  Recent Lipid Panel No results found for: CHOL, TRIG, HDL, CHOLHDL, VLDL, LDLCALC, LDLDIRECT  Physical Exam:    VS:  BP 96/60 (BP Location: Left Arm, Patient Position: Sitting)   Pulse 74   Ht 5' 6"  (1.676 m)   Wt 202 lb 4.8 oz (91.8 kg)   SpO2 96%   BMI 32.65 kg/m     Wt Readings from Last 5 Encounters:  10/17/20 202 lb 4.8 oz (91.8 kg)  09/04/20 184 lb 12.8 oz (83.8 kg)  08/11/20 205 lb 6.4 oz (93.2 kg)  07/24/20 202 lb 1.6 oz (91.7 kg)  07/21/20 203 lb (92.1 kg)    Constitutional: No acute distress Eyes: sclera non-icteric, normal conjunctiva and  lids ENMT: normal dentition, moist mucous membranes Cardiovascular: regular rhythm, normal rate, no murmurs. S1 and S2 normal. Radial pulses normal bilaterally. No jugular venous distention.  Respiratory: diffuse wheezing  GI : normal bowel sounds, soft and nontender. No distention.   MSK: extremities warm, well perfused. No edema.  NEURO: grossly nonfocal exam, moves all extremities. PSYCH: alert and oriented x 3, normal mood and affect.   ASSESSMENT:    1. Precordial pain   2. Dyspnea on exertion   3. History of smoking   4. Wheezing   5. Palpitations    PLAN:    Precordial pain - Plan: EKG 12-Lead - continues to have chest pain. We will update echo. 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 If chest pain continues, may need to consider cath. Continue Eliquis and BB.  Dyspnea on exertion - Plan: ECHOCARDIOGRAM LIMITED, Ambulatory referral to Pulmonology History of smoking - Plan: ECHOCARDIOGRAM LIMITED, Ambulatory referral to Pulmonology Wheezing - Plan: ECHOCARDIOGRAM LIMITED, Ambulatory referral to Pulmonology - diffuse and active wheezing. Needs referal to pulm in setting of smoking. Will place referral. - repeat echo to evaluate if EF remains mildly reduced. With hypotension, will be difficult to consider adding HF therapy.   Palpitations - Plan: ECHOCARDIOGRAM LIMITED, LONG TERM MONITOR (3-14 DAYS) - patient would like further evaluation with cardiac monitor. May help to determine if he has frequent ectopy that would contribute to mildly reduced EF.  Total time of encounter: 30 minutes total time of encounter, including 25 minutes spent in face-to-face patient care on the date of this encounter. This time includes coordination of care and counseling regarding above mentioned problem list. Remainder of non-face-to-face time involved reviewing chart documents/testing relevant to the patient encounter and documentation in the medical record. I have independently reviewed documentation  from referring provider.   Cherlynn Kaiser, MD [place]  [facility]    Medication Adjustments/Labs and Tests Ordered: Current medicines are reviewed at length with the patient today.  Concerns regarding medicines are outlined above.   Orders  Placed This Encounter  Procedures  . Ambulatory referral to Pulmonology  . LONG TERM MONITOR (3-14 DAYS)  . EKG 12-Lead  . ECHOCARDIOGRAM LIMITED    No orders of the defined types were placed in this encounter.   Patient Instructions  Medication Instructions:  No Changes In Medications at this time.  *If you need a refill on your cardiac medications before your next appointment, please call your pharmacy*  Testing/Procedures: Your physician has requested that you have an echocardiogram. Echocardiography is a painless test that uses sound waves to create images of your heart. It provides your doctor with information about the size and shape of your heart and how well your heart's chambers and valves are working. You may receive an ultrasound enhancing agent through an IV if needed to better visualize your heart during the echo.This procedure takes approximately one hour. There are no restrictions for this procedure. This will take place at the [address].   Follow-Up: At [facility], you and your health needs are our priority.  As part of our continuing mission to provide you with exceptional heart care, we have created designated Provider Care Teams.  These Care Teams include your primary Cardiologist (physician) and Advanced Practice Providers (APPs -  Physician Assistants and Nurse Practitioners) who all work together to provide you with the care you need, when you need it.  We recommend signing up for the patient portal called "MyChart".  Sign up information is provided on this After Visit Summary.  MyChart is used to connect with patients for Virtual Visits (Telemedicine).  Patients are able to view lab/test  results, encounter notes, upcoming appointments, etc.  Non-urgent messages can be sent to your provider as well.   To learn more about what you can do with MyChart, go to [email or web address].    Your next appointment:   3 month(s)  The format for your next appointment:   In Person  Provider:   Cherlynn Kaiser, MD  Other Instructions  AMBULATORY REFERRAL TO PULMONOLOGY- SOMEONE WILL CONTACT YOU TO SCHEDULE THIS  ZIO XT- Long Term Monitor Instructions   Your physician has requested you wear your ZIO patch monitor 14 days.   This is a single patch monitor.  [employer] supplies one patch monitor per enrollment.  Additional stickers are not available.   Please do not apply patch if you will be having a Nuclear Stress Test, Echocardiogram, Cardiac CT, MRI, or Chest Xray during the time frame you would be wearing the monitor. The patch cannot be worn during these tests.  You cannot remove and re-apply the ZIO XT patch monitor.   Your ZIO patch monitor will be sent USPS Priority mail from [facility] directly to your home address. The monitor may also be mailed to a PO BOX if home delivery is not available.   It may take 3-5 days to receive your monitor after you have been enrolled.   Once you have received you monitor, please review enclosed instructions.  Your monitor has already been registered assigning a specific monitor serial # to you.   Applying the monitor   Shave hair from upper left chest.   Hold abrader disc by orange tab.  Rub abrader in 40 strokes over left upper chest as indicated in your monitor instructions.   Clean area with 4 enclosed alcohol pads .  Use all pads to assure are is cleaned thoroughly.  Let dry.   Apply patch as indicated in monitor instructions.  Patch will be place under collarbone on left side of chest with arrow pointing upward.   Rub patch adhesive wings for 2 minutes.Remove white label marked "1".  Remove white label marked "2".  Rub  patch adhesive wings for 2 additional minutes.   While looking in a mirror, press and release button in center of patch.  A small green light will flash 3-4 times .  This will be your only indicator the monitor has been turned on.     Do not shower for the first 24 hours.  You may shower after the first 24 hours.   Press button if you feel a symptom. You will hear a small click.  Record Date, Time and Symptom in the Patient Log Book.   When you are ready to remove patch, follow instructions on last 2 pages of Patient Log Book.  Stick patch monitor onto last page of Patient Log Book.   Place Patient Log Book in [place] box.  Use locking tab on box and tape box closed securely.  The [employer] has [employer] on it.  Please place in mailbox as soon as possible.  Your physician should have your test results approximately 7 days after the monitor has been mailed back to [facility].   Call [place] at [phone] if you have questions regarding your ZIO XT patch monitor.  Call them immediately if you see an orange light blinking on your monitor.   If your monitor falls off in less than 4 days contact our Monitor department at [phone].  If your monitor becomes loose or falls off after 4 days call [employer] at [phone] for suggestions on securing your monitor.

## 2020-10-17 NOTE — Patient Instructions (Signed)
Medication Instructions:  No Changes In Medications at this time.  *If you need a refill on your cardiac medications before your next appointment, please call your pharmacy*  Testing/Procedures: Your physician has requested that you have an echocardiogram. Echocardiography is a painless test that uses sound waves to create images of your heart. It provides your doctor with information about the size and shape of your heart and how well your heart's chambers and valves are working. You may receive an ultrasound enhancing agent through an IV if needed to better visualize your heart during the echo.This procedure takes approximately one hour. There are no restrictions for this procedure. This will take place at the [address].   Follow-Up: At [facility], you and your health needs are our priority.  As part of our continuing mission to provide you with exceptional heart care, we have created designated Provider Care Teams.  These Care Teams include your primary Cardiologist (physician) and Advanced Practice Providers (APPs -  Physician Assistants and Nurse Practitioners) who all work together to provide you with the care you need, when you need it.  We recommend signing up for the patient portal called "MyChart".  Sign up information is provided on this After Visit Summary.  MyChart is used to connect with patients for Virtual Visits (Telemedicine).  Patients are able to view lab/test results, encounter notes, upcoming appointments, etc.  Non-urgent messages can be sent to your provider as well.   To learn more about what you can do with MyChart, go to [email or web address].    Your next appointment:   3 month(s)  The format for your next appointment:   In Person  Provider:   Cherlynn Kaiser, MD  Other Instructions  AMBULATORY REFERRAL TO PULMONOLOGY- SOMEONE WILL CONTACT YOU TO SCHEDULE THIS  ZIO XT- Long Term Monitor Instructions   Your physician has requested you  wear your ZIO patch monitor 14 days.   This is a single patch monitor.  [employer] supplies one patch monitor per enrollment.  Additional stickers are not available.   Please do not apply patch if you will be having a Nuclear Stress Test, Echocardiogram, Cardiac CT, MRI, or Chest Xray during the time frame you would be wearing the monitor. The patch cannot be worn during these tests.  You cannot remove and re-apply the ZIO XT patch monitor.   Your ZIO patch monitor will be sent USPS Priority mail from [facility] directly to your home address. The monitor may also be mailed to a PO BOX if home delivery is not available.   It may take 3-5 days to receive your monitor after you have been enrolled.   Once you have received you monitor, please review enclosed instructions.  Your monitor has already been registered assigning a specific monitor serial # to you.   Applying the monitor   Shave hair from upper left chest.   Hold abrader disc by orange tab.  Rub abrader in 40 strokes over left upper chest as indicated in your monitor instructions.   Clean area with 4 enclosed alcohol pads .  Use all pads to assure are is cleaned thoroughly.  Let dry.   Apply patch as indicated in monitor instructions.  Patch will be place under collarbone on left side of chest with arrow pointing upward.   Rub patch adhesive wings for 2 minutes.Remove white label marked "1".  Remove white label marked "2".  Rub patch adhesive wings for 2 additional minutes.  While looking in a mirror, press and release button in center of patch.  A small green light will flash 3-4 times .  This will be your only indicator the monitor has been turned on.     Do not shower for the first 24 hours.  You may shower after the first 24 hours.   Press button if you feel a symptom. You will hear a small click.  Record Date, Time and Symptom in the Patient Log Book.   When you are ready to remove patch, follow instructions on last 2  pages of Patient Log Book.  Stick patch monitor onto last page of Patient Log Book.   Place Patient Log Book in [place] box.  Use locking tab on box and tape box closed securely.  The [employer] has [employer] on it.  Please place in mailbox as soon as possible.  Your physician should have your test results approximately 7 days after the monitor has been mailed back to [facility].   Call [place] at [phone] if you have questions regarding your ZIO XT patch monitor.  Call them immediately if you see an orange light blinking on your monitor.   If your monitor falls off in less than 4 days contact our Monitor department at [phone].  If your monitor becomes loose or falls off after 4 days call [employer] at [phone] for suggestions on securing your monitor.

## 2020-10-22 ENCOUNTER — Ambulatory Visit: Payer: Medicaid Other | Admitting: Urology

## 2020-10-22 DIAGNOSIS — R339 Retention of urine, unspecified: Secondary | ICD-10-CM

## 2020-10-24 DIAGNOSIS — R072 Precordial pain: Secondary | ICD-10-CM

## 2020-10-24 DIAGNOSIS — R002 Palpitations: Secondary | ICD-10-CM | POA: Diagnosis not present

## 2020-10-27 ENCOUNTER — Encounter: Payer: Self-pay | Admitting: Urology

## 2020-10-27 ENCOUNTER — Other Ambulatory Visit: Payer: Self-pay

## 2020-10-27 ENCOUNTER — Ambulatory Visit (INDEPENDENT_AMBULATORY_CARE_PROVIDER_SITE_OTHER): Payer: Medicaid Other | Admitting: Urology

## 2020-10-27 VITALS — BP 114/74 | HR 96 | Temp 98.7°F

## 2020-10-27 DIAGNOSIS — R339 Retention of urine, unspecified: Secondary | ICD-10-CM | POA: Diagnosis not present

## 2020-10-27 DIAGNOSIS — N138 Other obstructive and reflux uropathy: Secondary | ICD-10-CM

## 2020-10-27 DIAGNOSIS — N401 Enlarged prostate with lower urinary tract symptoms: Secondary | ICD-10-CM | POA: Diagnosis not present

## 2020-10-27 LAB — BLADDER SCAN AMB NON-IMAGING: Scan Result: 14

## 2020-10-27 NOTE — Progress Notes (Signed)
Urological Symptom Review  Patient is experiencing the following symptoms: Frequent urination Stream starts and stops Have to strain to urinate   Review of Systems  Gastrointestinal (upper)  : Negative for upper GI symptoms  Gastrointestinal (lower) : Negative for lower GI symptoms  Constitutional : Negative for symptoms  Skin: Negative for skin symptoms  Eyes: Negative for eye symptoms  Ear/Nose/Throat : Negative for Ear/Nose/Throat symptoms  Hematologic/Lymphatic: Negative for Hematologic/Lymphatic symptoms  Cardiovascular : Negative for cardiovascular symptoms  Respiratory : Negative for respiratory symptoms  Endocrine: Negative for endocrine symptoms  Musculoskeletal: Negative for musculoskeletal symptoms  Neurological: Negative for neurological symptoms  Psychologic: Negative for psychiatric symptoms

## 2020-10-27 NOTE — Patient Instructions (Signed)

## 2020-10-27 NOTE — Progress Notes (Signed)
10/27/2020 11:33 AM   [name] [birth year] [id number]  Referring provider: [name], MD [address] [place],  [place] [id number]  followup BPH  HPI: [name] is a 55yo here for followup with BPH and incomplete emptying. PVR 14cc. No UTI since last visit. He denies any significant LUTS.    PMH: Past Medical History:  Diagnosis Date  . Arthritis   . Asthma   . 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 Bipolar disorder ([place])   . Colostomy in place [facility])   . Crohn's colitis ([place])    s/p total colectomy with ileostomy in 2009  . Crohn's disease of small intestine [facility]) Sept 2012   ileoscopy: multiple ulcers likely secondary to Crohn's  . DEEP VENOUS THROMBOPHLEBITIS, HX OF 02/11/2009   Qualifier: Diagnosis of  By: Zeb Comfort    . Depression   . DVT (deep venous thrombosis) ([place]) 2009   right upper extremity due to PICC  . Enterococcus UTI 2009  . GERD (gastroesophageal reflux disease)   . Hernia   . Hyperlipidemia   . Insomnia   . Migraine headache   . Nystagmus   . PULMONARY EMBOLISM, HX OF 02/11/2009   Qualifier: Diagnosis of  By: Zeb Comfort    . S/P endoscopy Sept 2012   mild gastritis, small hiatal hernia, no ulcers  . Schizophrenia ([place])   . Schizophrenia, acute [facility])     Surgical History: Past Surgical History:  Procedure Laterality Date  . APPENDECTOMY    . ESOPHAGOGASTRODUODENOSCOPY  07/2010   gastritis,  bx neg for H.Pylori  . ESOPHAGOGASTRODUODENOSCOPY N/A 01/08/2014   SLF:NO SOURCE FOR ODYNOPHAGIA IDENTIFIED/Multiple small ulcers in the gastric antrum  . EYE SURGERY    . ILEOSCOPY  06/29/2011   SLF: ulcers,multiple/small HH/mild gastritis  . KIDNEY SURGERY    . LAPAROTOMY N/A 06/05/2013   Procedure: EXPLORATORY LAPAROTOMY;  Surgeon: Donato Heinz, MD;  Location: [facility];  Service: General;  Laterality: N/A;  . LYSIS OF ADHESION N/A 06/05/2013   Procedure: LYSIS OF ADHESIONS;  Surgeon:  Donato Heinz, MD;  Location: [facility];  Service: General;  Laterality: N/A;  . small bowel capsule study  08/2010   few tiny nonbleeding erosions/ulcers ?secondary to relafen or Crohn's. Entire small bowel not seen.   Marland Kitchen TOTAL COLECTOMY  2009   with ileostomy at [facility] for refractory disease   . TOTAL HIP ARTHROPLASTY     right, due to avascular necrosis from chronic prednisone  . TOTAL SHOULDER REPLACEMENT     bilateral  . TRANSURETHRAL RESECTION OF PROSTATE N/A 12/03/2019   Procedure: TRANSURETHRAL RESECTION OF THE PROSTATE (TURP);  Surgeon: Cleon Gustin, MD;  Location: [facility];  Service: Urology;  Laterality: N/A;    Home Medications:  Allergies as of 10/27/2020      Reactions   Other Shortness Of Breath   Other reaction(s): Shortness Of Breath Throat swells Throat swells   Aspirin    REACTION: unknown reaction   Bactrim [sulfamethoxazole-trimethoprim]    ABD CRAMPS AND DIARRHEA   Codeine    REACTION: unknown reaction   Ibuprofen    REACTION: unknown reaction   Oxycodone-acetaminophen Other (See Comments)   Other reaction(s): Swelling   Penicillins    REACTION: unknown reaction   Remicade [infliximab]    COULDN'T BREATHE   Tramadol Nausea Only   Other reaction(s): Nausea   Tramadol Hcl  REACTION: unknown reaction   Vicodin [hydrocodone-acetaminophen] Nausea Only      Medication List       Accurate as of October 27, 2020 11:33 AM. If you have any questions, ask your nurse or doctor.        Accu-Chek Aviva Plus test strip Generic drug: glucose blood SMARTSIG:Via Meter   acetaminophen 500 MG tablet Commonly known as: TYLENOL Take 1,000 mg by mouth every 6 (six) hours as needed. For pain   Active Life 1-Pc Drain 19-64mm Pouch Misc   albuterol (2.5 MG/3ML) 0.083% nebulizer solution Commonly known as: PROVENTIL Take 2.5 mg by nebulization every 6 (six) hours as needed for wheezing or shortness of breath.   albuterol 108 (90 Base) MCG/ACT  inhaler Commonly known as: VENTOLIN HFA Inhale 2 puffs into the lungs every 6 (six) hours as needed for wheezing or shortness of breath.   atorvastatin 80 MG tablet Commonly known as: LIPITOR Take 80 mg by mouth daily.   azelastine 0.1 % nasal spray Commonly known as: ASTELIN Place 2 sprays into both nostrils 2 (two) times daily.   B-12 1000 MCG Caps Take by mouth daily.   benztropine 0.5 MG tablet Commonly known as: COGENTIN Take 0.5 mg by mouth daily.   Calcium 600/Vitamin D3 600-800 MG-UNIT Tabs Generic drug: Calcium Carb-Cholecalciferol TAKE (1) TABLET BY MOUTH THREE TIMES DAILY AFTER MEALS.   dicyclomine 10 MG capsule Commonly known as: BENTYL TAKE 1 OR 2 CAPSULE BY MOUTH AS DIRECTED BEFORE MEALS AND AT BEDTIME.   divalproex 500 MG 24 hr tablet Commonly known as: DEPAKOTE ER Take 1,000 mg by mouth every evening.   divalproex 250 MG 24 hr tablet Commonly known as: DEPAKOTE ER Take 250 mg by mouth daily.   docusate sodium 100 MG capsule Commonly known as: COLACE Take 1 capsule (100 mg total) by mouth 2 (two) times daily.   Eliquis 5 MG Tabs tablet Generic drug: apixaban Take 5 mg by mouth 2 (two) times daily.   folic acid 1 MG tablet Commonly known as: FOLVITE Take 1 mg by mouth daily.   furosemide 40 MG tablet Commonly known as: LASIX Take 40 mg by mouth daily.   gabapentin 300 MG capsule Commonly known as: NEURONTIN Take 900 mg by mouth 3 (three) times daily.   lidocaine 2 % solution Commonly known as: XYLOCAINE 2 TSP  PO Q4-6H PRN FOR HEARTBURN OR UPPER ABDOMINAL PAIN   meloxicam 7.5 MG tablet Commonly known as: MOBIC Take 7.5 mg by mouth daily.   mercaptopurine 50 MG tablet Commonly known as: PURINETHOL TAKE 2 TABLETS DAILY ON EMPTY STOMACH 1 HOUR BEFORE OR 2 HOURS AFTER MEAL   metFORMIN 500 MG tablet Commonly known as: GLUCOPHAGE Take by mouth 2 (two) times daily with a meal.   metoprolol tartrate 50 MG tablet Commonly known as:  LOPRESSOR Take 1 tablet (50 mg total) by mouth once for 1 dose. PLEASE TAKE 21m (1 TABLET) 1 HOUR PRIOR TO CTA SCAN   mirtazapine 15 MG tablet Commonly known as: REMERON Take 15 mg by mouth at bedtime.   pantoprazole 40 MG tablet Commonly known as: PROTONIX TAKE (1) TABLET BY MOUTH TWICE A DAY BEFORE MEALS. (BREAKFAST AND SUPPER)   potassium chloride SA 20 MEQ tablet Commonly known as: KLOR-CON Take 20 mEq by mouth daily.   risperiDONE 2 MG tablet Commonly known as: RISPERDAL Take 2 mg by mouth at bedtime.   risperiDONE 1 MG tablet Commonly known as: RISPERDAL Take 1 tablet by  mouth at bedtime.   sertraline 50 MG tablet Commonly known as: ZOLOFT Take 50 mg by mouth daily.   tamsulosin 0.4 MG Caps capsule Commonly known as: FLOMAX Take 0.4 mg by mouth daily.   traZODone 50 MG tablet Commonly known as: DESYREL Take 50 mg by mouth at bedtime.   Walker Wheels Misc USE AS DIRECTED       Allergies:  Allergies  Allergen Reactions  . Other Shortness Of Breath    Other reaction(s): Shortness Of Breath Throat swells Throat swells  . Aspirin     REACTION: unknown reaction  . Bactrim [Sulfamethoxazole-Trimethoprim]     ABD CRAMPS AND DIARRHEA  . Codeine     REACTION: unknown reaction  . Ibuprofen     REACTION: unknown reaction  . Oxycodone-Acetaminophen Other (See Comments)    Other reaction(s): Swelling  . Penicillins     REACTION: unknown reaction  . Remicade [Infliximab]     COULDN'T BREATHE  . Tramadol Nausea Only    Other reaction(s): Nausea  . Tramadol Hcl     REACTION: unknown reaction  . Vicodin [Hydrocodone-Acetaminophen] Nausea Only    Family History: Family History  Problem Relation Age of Onset  . Diabetes Mother   . Heart disease Mother   . Diabetes Father   . Heart disease Father   . Colon cancer Neg Hx     Social History:  reports that he has been smoking cigarettes. He has been smoking about 0.10 packs per day. He has never used  smokeless tobacco. He reports that he does not drink alcohol and does not use drugs.  ROS: All other review of systems were reviewed and are negative except what is noted above in HPI  Physical Exam: BP 114/74   Pulse 96   Temp 98.7 F (37.1 C)   Constitutional:  Alert and oriented, No acute distress. HEENT: [place] AT, moist mucus membranes.  Trachea midline, no masses. Cardiovascular: No clubbing, cyanosis, or edema. Respiratory: Normal respiratory effort, no increased work of breathing. GI: Abdomen is soft, nontender, nondistended, no abdominal masses GU: No CVA tenderness.  Lymph: No cervical or inguinal lymphadenopathy. Skin: No rashes, bruises or suspicious lesions. Neurologic: Grossly intact, no focal deficits, moving all 4 extremities. Psychiatric: Normal mood and affect.  Laboratory Data: Lab Results  Component Value Date   WBC 3.5 (L) 10/10/2020   HGB 14.4 10/10/2020   HCT 40.9 10/10/2020   MCV 104.3 (H) 10/10/2020   PLT 141 10/10/2020    Lab Results  Component Value Date   CREATININE 0.75 10/10/2020    No results found for: PSA  No results found for: TESTOSTERONE  Lab Results  Component Value Date   HGBA1C 6.1 (H) 11/30/2019    Urinalysis    Component Value Date/Time   COLORURINE YELLOW 08/07/2019 1419   APPEARANCEUR HAZY (A) 08/07/2019 1419   LABSPEC <1.005 (L) 08/07/2019 1419   PHURINE >9.0 (H) 08/07/2019 1419   GLUCOSEU NEGATIVE 08/07/2019 1419   HGBUR SMALL (A) 08/07/2019 1419   BILIRUBINUR NEGATIVE 08/07/2019 1419   KETONESUR NEGATIVE 08/07/2019 1419   PROTEINUR >300 (A) 08/07/2019 1419   UROBILINOGEN 0.2 10/17/2014 1940   NITRITE POSITIVE (A) 08/07/2019 1419   LEUKOCYTESUR MODERATE (A) 08/07/2019 1419    Lab Results  Component Value Date   BACTERIA MANY (A) 08/07/2019    Pertinent Imaging:  No results found for this or any previous visit.  No results found for this or any previous visit.  No results found  for this or any previous  visit.  No results found for this or any previous visit.  Results for orders placed during the hospital encounter of 04/27/16  US Renal  Narrative CLINICAL DATA:  Bladder neck alpha obstruction. Recurrent UTIs. History of Crohn's disease. Colostomy. EXAM: RENAL / URINARY TRACT ULTRASOUND COMPLETE COMPARISON:  CT 03/05/2014 . FINDINGS: Right Kidney: Length: 12.6 cm. Echogenicity within normal limits. No mass or hydronephrosis visualized. Left Kidney: Length: 11.9 cm. Echogenicity within normal limits. No mass or hydronephrosis visualized. Bladder: Appears normal for degree of bladder distention. Bilateral ureteral jets noted. IMPRESSION: No acute or focal abnormality. Electronically Signed By: Marcello Moores  Register On: 04/27/2016 13:37  No results found for this or any previous visit.  No results found for this or any previous visit.  No results found for this or any previous visit.   Assessment & Plan:    1. BPH with Incomplete emptying of bladder -Improved after TURP. RTC 1 year with PVR - Urinalysis, Routine w reflex microscopic - BLADDER SCAN AMB NON-IMAGING   No follow-ups on file.  [name], MD  [facility] [place]

## 2020-11-03 ENCOUNTER — Institutional Professional Consult (permissible substitution): Payer: Medicaid Other | Admitting: Pulmonary Disease

## 2020-11-05 ENCOUNTER — Other Ambulatory Visit: Payer: Self-pay

## 2020-11-05 ENCOUNTER — Ambulatory Visit (INDEPENDENT_AMBULATORY_CARE_PROVIDER_SITE_OTHER): Payer: Medicaid Other | Admitting: Nurse Practitioner

## 2020-11-05 ENCOUNTER — Encounter: Payer: Self-pay | Admitting: Nurse Practitioner

## 2020-11-05 VITALS — BP 110/70 | HR 71 | Temp 97.0°F | Ht 66.0 in | Wt 201.6 lb

## 2020-11-05 DIAGNOSIS — R197 Diarrhea, unspecified: Secondary | ICD-10-CM

## 2020-11-05 DIAGNOSIS — K50819 Crohn's disease of both small and large intestine with unspecified complications: Secondary | ICD-10-CM

## 2020-11-05 DIAGNOSIS — K219 Gastro-esophageal reflux disease without esophagitis: Secondary | ICD-10-CM

## 2020-11-05 NOTE — Patient Instructions (Signed)
Your health issues we discussed today were:   Crohn's disease with previous diarrhea: 1. I am glad you are doing better! 2. Continue to monitor your stools and if you have more sudden diarrhea, let us know 3. As we discussed your recent labs look great estimation point there is no need to recheck your labs today 4. Call us if you have any abdominal pain, blood in your stool  GERD (reflux/heartburn): 1. I am glad you are doing well on your acid blocker! 2. Continue take your acid blocker to help prevent worsening reflux/heartburn  Overall I recommend:  1. Continue other current medications 2. Return for follow-up in 4 months 3. Call us for any questions or concerns   ---------------------------------------------------------------  I am glad you have gotten your COVID-19 vaccination!  Even though you are fully vaccinated you should continue to follow CDC and state/local guidelines.  ---------------------------------------------------------------   At [facility] we value your feedback. You may receive a survey about your visit today. Please share your experience as we strive to create trusting relationships with our patients to provide genuine, compassionate, quality care.  We appreciate your understanding and patience as we review any laboratory studies, imaging, and other diagnostic tests that are ordered as we care for you. Our office policy is 5 business days for review of these results, and any emergent or urgent results are addressed in a timely manner for your best interest. If you do not hear from our office in 1 week, please contact us.   We also encourage the use of MyChart, which contains your medical information for your review as well. If you are not enrolled in this feature, an access code is on this after visit summary for your convenience. Thank you for allowing [place] to be involved in your care.  It was great to see you today!  I hope you have a safe and  warm winter!!

## 2020-11-05 NOTE — Progress Notes (Signed)
Referring Provider: [name], MD Primary Care Physician:  [name], MD Primary GI:  Dr. Abbey Chatters  Chief Complaint  Patient presents with  . Crohn's Disease    Reports doing fine. No complaints. Not having any diarrhea    HPI:   [name] is a 55 y.o. male who presents for follow-up. The patient was last seen in our office 09/04/2020 for Crohn's disease, diarrhea, generalized abdominal pain.  Noted be at risk for osteopenia.  He is status post total colectomy with ileostomy in 2009 due to his Crohn's disease.  Empties his bag 4 times a day which is generally solid unless he drinks coffee.  Most recent DEXA scan 2017 with history of prolonged exposure to steroids recommended repeat in 2021.  Currently on mercaptopurine.  At his last visit he noted he had an episode of diarrhea that morning otherwise he is usually having formed stools.  No obvious triggers for his diarrhea (drinks bottled water, no recent antibiotics).  He was recently in the hospital for DVT and PE.  Still on Eliquis which will be lifelong otherwise no new medications.  Occasional mid abdominal pain that is "bearable but just annoying."  No other extra GI manifestations of Crohn's.  Still on Colace and last took a dose the night before.  Recommended stool studies including C. difficile and GI path panel, CBC, CMP, CRP, ESR, call for any worsening or no improvement 3 to 5 days, avoiding excessive amounts of caffeine for now, hold Colace for now, consider Bentyl versus Imodium versus Lomotil pending stool study results.  Follow-up in 2 months.  C. difficile and GI path panel were not completed.  Serologies completed 10/10/2020 with CBC noting normal hemoglobin, normal platelets.  Mildly suppressed WBC (intermittently chronic for him).  CMP found to be essentially normal.  CRP and sed rate both normal.  When he called to give his results he notified us that he had not had any diarrhea ongoing and did not feel the need to  do stool studies.  Today he states he is doing okay overall. Denies any further diarrhea. Denies abdominal pain, N/V, hematochezia, melena, fever, chills, unintentional weight loss. Denies rashes or mouth sores. Has about 4 stools a day, formed/soft unless he drinks a lot of liquid at which point he'll have looser stools. Denies any unusual bleeding on Eliquis. Had DXA scan done last year (Appears to have low bone density with T-score between -1 and -2.5; 5.3% ten-year risk of fracture). He is currently on Calcium and Vit D for osteopenia. No GERD symptoms on PPI. Denies URI or flu-like symptoms. Denies loss of sense of taste or smell. The patient has received COVID-19 vaccination(s). Denies chest pain, dyspnea, dizziness, lightheadedness, syncope, near syncope. Denies any other upper or lower GI symptoms.  Past Medical History:  Diagnosis Date  . Arthritis   . Asthma   . 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 Bipolar disorder ([place])   . Colostomy in place [facility])   . Crohn's colitis ([place])    s/p total colectomy with ileostomy in 2009  . Crohn's disease of small intestine [facility]) Sept 2012   ileoscopy: multiple ulcers likely secondary to Crohn's  . DEEP VENOUS THROMBOPHLEBITIS, HX OF 02/11/2009   Qualifier: Diagnosis of  By: Zeb Comfort    . Depression   . DVT (deep venous thrombosis) ([place]) 2009   right upper extremity due to PICC  .  Enterococcus UTI 2009  . GERD (gastroesophageal reflux disease)   . Hernia   . Hyperlipidemia   . Insomnia   . Migraine headache   . Nystagmus   . PULMONARY EMBOLISM, HX OF 02/11/2009   Qualifier: Diagnosis of  By: Zeb Comfort    . S/P endoscopy Sept 2012   mild gastritis, small hiatal hernia, no ulcers  . Schizophrenia ([place])   . Schizophrenia, acute [facility])     Past Surgical History:  Procedure Laterality Date  . APPENDECTOMY    . ESOPHAGOGASTRODUODENOSCOPY  07/2010   gastritis,  bx neg for H.Pylori   . ESOPHAGOGASTRODUODENOSCOPY N/A 01/08/2014   SLF:NO SOURCE FOR ODYNOPHAGIA IDENTIFIED/Multiple small ulcers in the gastric antrum  . EYE SURGERY    . ILEOSCOPY  06/29/2011   SLF: ulcers,multiple/small HH/mild gastritis  . KIDNEY SURGERY    . LAPAROTOMY N/A 06/05/2013   Procedure: EXPLORATORY LAPAROTOMY;  Surgeon: Donato Heinz, MD;  Location: [facility];  Service: General;  Laterality: N/A;  . LYSIS OF ADHESION N/A 06/05/2013   Procedure: LYSIS OF ADHESIONS;  Surgeon: Donato Heinz, MD;  Location: [facility];  Service: General;  Laterality: N/A;  . small bowel capsule study  08/2010   few tiny nonbleeding erosions/ulcers ?secondary to relafen or Crohn's. Entire small bowel not seen.   Marland Kitchen TOTAL COLECTOMY  2009   with ileostomy at [facility] for refractory disease   . TOTAL HIP ARTHROPLASTY     right, due to avascular necrosis from chronic prednisone  . TOTAL SHOULDER REPLACEMENT     bilateral  . TRANSURETHRAL RESECTION OF PROSTATE N/A 12/03/2019   Procedure: TRANSURETHRAL RESECTION OF THE PROSTATE (TURP);  Surgeon: Cleon Gustin, MD;  Location: [facility];  Service: Urology;  Laterality: N/A;    Current Outpatient Medications  Medication Sig Dispense Refill  . ACCU-CHEK AVIVA PLUS test strip SMARTSIG:Via Meter    . acetaminophen (TYLENOL) 500 MG tablet Take 1,000 mg by mouth every 6 (six) hours as needed. For pain    . albuterol (PROVENTIL) (2.5 MG/3ML) 0.083% nebulizer solution Take 2.5 mg by nebulization every 6 (six) hours as needed for wheezing or shortness of breath.    Marland Kitchen albuterol (VENTOLIN HFA) 108 (90 Base) MCG/ACT inhaler Inhale 2 puffs into the lungs every 6 (six) hours as needed for wheezing or shortness of breath.    Marland Kitchen atorvastatin (LIPITOR) 80 MG tablet Take 80 mg by mouth daily.    Marland Kitchen azelastine (ASTELIN) 0.1 % nasal spray Place 2 sprays into both nostrils 2 (two) times daily.    . benztropine (COGENTIN) 0.5 MG tablet Take 0.5 mg by mouth daily.     Marland Kitchen CALCIUM 600/VITAMIN D3 600-800  MG-UNIT TABS TAKE (1) TABLET BY MOUTH THREE TIMES DAILY AFTER MEALS. 84 tablet 5  . Cyanocobalamin (B-12) 1000 MCG CAPS Take by mouth daily.    Marland Kitchen dicyclomine (BENTYL) 10 MG capsule TAKE 1 OR 2 CAPSULE BY MOUTH AS DIRECTED BEFORE MEALS AND AT BEDTIME. (Patient taking differently: As needed) 60 capsule 3  . divalproex (DEPAKOTE ER) 250 MG 24 hr tablet Take 250 mg by mouth daily.     . divalproex (DEPAKOTE) 500 MG 24 hr tablet Take 1,000 mg by mouth every evening.    . docusate sodium (COLACE) 100 MG capsule Take 1 capsule (100 mg total) by mouth 2 (two) times daily. (Patient taking differently: Take 100 mg by mouth 2 (two) times daily as needed.) 10 capsule 0  . ELIQUIS 5 MG TABS tablet Take  5 mg by mouth 2 (two) times daily.    . folic acid (FOLVITE) 1 MG tablet Take 1 mg by mouth daily.    . furosemide (LASIX) 40 MG tablet Take 40 mg by mouth daily.    Marland Kitchen gabapentin (NEURONTIN) 300 MG capsule Take 900 mg by mouth 3 (three) times daily.    Marland Kitchen lidocaine (XYLOCAINE) 2 % solution 2 TSP  PO Q4-6H PRN FOR HEARTBURN OR UPPER ABDOMINAL PAIN 300 mL 11  . meloxicam (MOBIC) 7.5 MG tablet Take 7.5 mg by mouth daily.    . mercaptopurine (PURINETHOL) 50 MG tablet TAKE 2 TABLETS DAILY ON EMPTY STOMACH 1 HOUR BEFORE OR 2 HOURS AFTER MEAL 60 tablet 11  . metFORMIN (GLUCOPHAGE) 500 MG tablet Take by mouth 2 (two) times daily with a meal.    . mirtazapine (REMERON) 15 MG tablet Take 15 mg by mouth at bedtime.     . Misc. Devices (WALKER WHEELS) MISC USE AS DIRECTED 1 each 0  . Ostomy Supplies (ACTIVE LIFE 1-PC DRAIN [phone]) Pouch MISC     . pantoprazole (PROTONIX) 40 MG tablet TAKE (1) TABLET BY MOUTH TWICE A DAY BEFORE MEALS. (BREAKFAST AND SUPPER) 56 tablet 11  . potassium chloride SA (K-DUR,KLOR-CON) 20 MEQ tablet Take 20 mEq by mouth daily.     . risperiDONE (RISPERDAL) 1 MG tablet Take 1 tablet by mouth at bedtime.    . risperiDONE (RISPERDAL) 2 MG tablet Take 2 mg by mouth at bedtime.     . sertraline  (ZOLOFT) 50 MG tablet Take 50 mg by mouth daily.    . tamsulosin (FLOMAX) 0.4 MG CAPS capsule Take 0.4 mg by mouth daily.    . traZODone (DESYREL) 50 MG tablet Take 50 mg by mouth at bedtime.    . metoprolol tartrate (LOPRESSOR) 50 MG tablet Take 1 tablet (50 mg total) by mouth once for 1 dose. PLEASE TAKE 20m (1 TABLET) 1 HOUR PRIOR TO CTA SCAN 1 tablet 0   No current facility-administered medications for this visit.    Allergies as of 11/05/2020 - Review Complete 11/05/2020  Allergen Reaction Noted  . Other Shortness Of Breath 04/17/2012  . Aspirin    . Bactrim [sulfamethoxazole-trimethoprim]  11/07/2013  . Codeine    . Ibuprofen    . Oxycodone-acetaminophen Other (See Comments) 04/17/2012  . Penicillins    . Remicade [infliximab]  04/18/2013  . Tramadol Nausea Only 11/04/2011  . Tramadol hcl    . Vicodin [hydrocodone-acetaminophen] Nausea Only 05/17/2011    Family History  Problem Relation Age of Onset  . Diabetes Mother   . Heart disease Mother   . Diabetes Father   . Heart disease Father   . Colon cancer Neg Hx     Social History   Socioeconomic History  . Marital status: Single    Spouse name: Not on file  . Number of children: Not on file  . Years of education: Not on file  . Highest education level: Not on file  Occupational History  . Not on file  Tobacco Use  . Smoking status: Current Every Day Smoker    Packs/day: 0.10    Types: Cigarettes    Last attempt to quit: 08/10/2020    Years since quitting: 0.2  . Smokeless tobacco: Never Used  . Tobacco comment: down to 3 ciggs a day from 2 packs a day  Vaping Use  . Vaping Use: Never used  Substance and Sexual Activity  . Alcohol use: No  .  Drug use: No  . Sexual activity: Never  Other Topics Concern  . Not on file  Social History Narrative   Girlfriend of six years, Rectal Cancer on Hospice (05/2011).   Social Determinants of Health   Financial Resource Strain: Not on file  Food Insecurity: Not on  file  Transportation Needs: Not on file  Physical Activity: Not on file  Stress: Not on file  Social Connections: Not on file    Subjective: Review of Systems  Constitutional: Negative for chills, fever, malaise/fatigue and weight loss.  HENT: Negative for congestion and sore throat.   Respiratory: Negative for cough and shortness of breath.   Cardiovascular: Negative for chest pain and palpitations.  Gastrointestinal: Negative for abdominal pain, blood in stool, constipation, diarrhea, heartburn, melena, nausea and vomiting.  Musculoskeletal: Negative for joint pain and myalgias.  Skin: Negative for rash.  Neurological: Negative for dizziness and weakness.  Endo/Heme/Allergies: Does not bruise/bleed easily.  Psychiatric/Behavioral: Negative for depression. The patient is not nervous/anxious.   All other systems reviewed and are negative.    Objective: BP 110/70   Pulse 71   Temp (!) 97 F (36.1 C)   Ht 5' 6"  (1.676 m)   Wt 201 lb 9.6 oz (91.4 kg)   BMI 32.54 kg/m  Physical Exam Vitals and nursing note reviewed.  Constitutional:      General: He is not in acute distress.    Appearance: Normal appearance. He is obese. He is not ill-appearing, toxic-appearing or diaphoretic.  HENT:     Head: Normocephalic and atraumatic.     Nose: No congestion or rhinorrhea.  Eyes:     General: No scleral icterus. Cardiovascular:     Rate and Rhythm: Normal rate and regular rhythm.     Heart sounds: Normal heart sounds.  Pulmonary:     Effort: Pulmonary effort is normal.     Breath sounds: Normal breath sounds.  Abdominal:     General: Bowel sounds are normal. There is no distension.     Palpations: Abdomen is soft. There is no hepatomegaly, splenomegaly or mass.     Tenderness: There is no abdominal tenderness. There is no guarding or rebound.     Hernia: No hernia is present.  Musculoskeletal:     Cervical back: Neck supple.  Skin:    General: Skin is warm and dry.      Coloration: Skin is not jaundiced.     Findings: No bruising or rash.  Neurological:     General: No focal deficit present.     Mental Status: He is alert and oriented to person, place, and time. Mental status is at baseline.  Psychiatric:        Mood and Affect: Mood normal.        Behavior: Behavior normal.        Thought Content: Thought content normal.      Assessment:  Pleasant 55 year old male presents for follow-up on Crohn's disease and recent acute bout of diarrhea.  He also has GERD.  Clinically he is doing much better.  No red flag/warning signs or symptoms.  Crohn's disease: Status post subtotal colectomy for his Crohn's disease.  Currently managed on mercaptopurine.  Labs are just completed about a month ago and looked good, no need for labs today.  Denies abdominal pain, hematochezia, rashes, mouth sores, acute diarrhea.  Overall he feels he is doing well.  Most recent labs a month ago found normal CRP and sed rate.  Recommend he  continue his current medications.  I think he is doing well at this point.  Acute diarrhea: He was seen about 2 months ago for acute diarrhea which resolved on its own after his visit.  He did not complete stool studies because of improvement.  Likely dietary.  He discussed that he does have increased loose stools if he drinks more significant fluids given his subtotal colectomy status.  He also will have loose stools with caffeine, as expected.  I recommended he limit caffeine and try to moderate his fluid intakes throughout the day rather than large doses a couple times a day.  Continue to monitor, notify us of any acute change in his stool status.  GERD: Currently on Protonix, doing well.  Recommend he continue his meds.   Plan: 1. Continue current medications 2. No need for labs today 3. Notify of any sudden changes 4. Follow-up in 4 months for routine 33-monthCrohn's care on medication.    Thank you for allowing [facility] participate in the care  of RBurns DNP, AGNP-C Adult & Gerontological Nurse Practitioner [facility]   11/05/2020 11:48 AM   Disclaimer: This note was dictated with voice recognition software. Similar sounding words can inadvertently be transcribed and may not be corrected upon review.

## 2020-11-13 ENCOUNTER — Other Ambulatory Visit: Payer: Self-pay

## 2020-11-13 ENCOUNTER — Ambulatory Visit (HOSPITAL_COMMUNITY): Payer: Medicaid Other | Attending: Cardiology

## 2020-11-13 DIAGNOSIS — Z87891 Personal history of nicotine dependence: Secondary | ICD-10-CM | POA: Diagnosis present

## 2020-11-13 DIAGNOSIS — R062 Wheezing: Secondary | ICD-10-CM

## 2020-11-13 DIAGNOSIS — R0600 Dyspnea, unspecified: Secondary | ICD-10-CM | POA: Diagnosis present

## 2020-11-13 DIAGNOSIS — R0609 Other forms of dyspnea: Secondary | ICD-10-CM

## 2020-11-13 DIAGNOSIS — R002 Palpitations: Secondary | ICD-10-CM

## 2020-11-13 LAB — ECHOCARDIOGRAM LIMITED
Area-P 1/2: 4.36 cm2
S' Lateral: 3.8 cm

## 2020-11-13 MED ORDER — PERFLUTREN LIPID MICROSPHERE
1.0000 mL | INTRAVENOUS | Status: AC | PRN
  Administered 2020-11-13: 2 mL via INTRAVENOUS

## 2020-11-14 ENCOUNTER — Encounter: Payer: Self-pay | Admitting: Pulmonary Disease

## 2020-11-14 ENCOUNTER — Other Ambulatory Visit (HOSPITAL_COMMUNITY): Payer: Self-pay | Admitting: *Deleted

## 2020-11-14 ENCOUNTER — Ambulatory Visit (INDEPENDENT_AMBULATORY_CARE_PROVIDER_SITE_OTHER): Payer: Medicaid Other | Admitting: Pulmonary Disease

## 2020-11-14 VITALS — BP 96/56 | HR 75 | Temp 97.3°F | Ht 66.0 in | Wt 198.4 lb

## 2020-11-14 DIAGNOSIS — R0600 Dyspnea, unspecified: Secondary | ICD-10-CM | POA: Diagnosis not present

## 2020-11-14 DIAGNOSIS — R0683 Snoring: Secondary | ICD-10-CM | POA: Diagnosis not present

## 2020-11-14 DIAGNOSIS — I2699 Other pulmonary embolism without acute cor pulmonale: Secondary | ICD-10-CM | POA: Diagnosis not present

## 2020-11-14 DIAGNOSIS — Z720 Tobacco use: Secondary | ICD-10-CM | POA: Diagnosis not present

## 2020-11-14 MED ORDER — FLOVENT HFA 110 MCG/ACT IN AERO
2.0000 | INHALATION_SPRAY | Freq: Two times a day (BID) | RESPIRATORY_TRACT | 12 refills | Status: DC
Start: 2020-11-14 — End: 2021-01-09

## 2020-11-14 NOTE — Patient Instructions (Addendum)
Follow up in 2 months with pulmonary function testing at that time  Start flovent 122mg inhaler 2 puffs twice daily  Continue as needed albuterol inhaler or nebulizer   We will refer you to our lung cancer screening program

## 2020-11-14 NOTE — Progress Notes (Signed)
Synopsis: Referred in January 2022 by Cherlynn Kaiser, MD for shortness of breath  Subjective:   PATIENT ID: [name] GENDER: male DOB: 03-06-1966, MRN: [id number]   HPI  Chief Complaint  Patient presents with  . Consult    Referred by cardiology for dyspnea, wheezing. Pt had PE 06/19/2020, s/s present since then.     [name] is a 55 year old male, daily smoker with history of DVT/PE, crohn's disease s/p colectomy with ileostomy and GERD who is referred to pulmonary clinic for shortness of breath.   He reports having shortness of breath for many years, but this has progressed over recent months since he was diagnosed with DVT/PE in the fall last year. He reports having to use his albuterol inhaler or nebulizer treatment  3 times per day due to shortness of breath and wheezing. He also reports night time awakenings due to shortness of breath and he uses a nebulizer treatment at night. He reports he has mild relief from the albuterol.   He started smoking at 55 years old. He smoked 4 packs per day for the initial 5 years, then cut down to 2 packs per day until 2 years ago where he has cut down to 0.5 to 1 pack per day. Overall, he has a 40+ pack year history of smoking.   He was previously ordered for a sleep study by the cardiology team but he does not have transportation to do an in lab sleep study.    He is on disability. His maternal grandmother had COPD.   Past Medical History:  Diagnosis Date  . Arthritis   . Asthma   . 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 Bipolar disorder ([place])   . Colostomy in place [facility])   . Crohn's colitis ([place])    s/p total colectomy with ileostomy in 2009  . Crohn's disease of small intestine [facility]) Sept 2012   ileoscopy: multiple ulcers likely secondary to Crohn's  . DEEP VENOUS THROMBOPHLEBITIS, HX OF 02/11/2009   Qualifier: Diagnosis of  By: Zeb Comfort    . Depression   . DVT (deep  venous thrombosis) ([place]) 2009   right upper extremity due to PICC  . Enterococcus UTI 2009  . GERD (gastroesophageal reflux disease)   . Hernia   . Hyperlipidemia   . Insomnia   . Migraine headache   . Nystagmus   . PULMONARY EMBOLISM, HX OF 02/11/2009   Qualifier: Diagnosis of  By: Zeb Comfort    . S/P endoscopy Sept 2012   mild gastritis, small hiatal hernia, no ulcers  . Schizophrenia ([place])   . Schizophrenia, acute ([place])      Family History  Problem Relation Age of Onset  . Diabetes Mother   . Heart disease Mother   . Diabetes Father   . Heart disease Father   . COPD Maternal Grandmother   . Colon cancer Neg Hx      Social History   Socioeconomic History  . Marital status: Single    Spouse name: Not on file  . Number of children: Not on file  . Years of education: Not on file  . Highest education Lane: Not on file  Occupational History  . Not on file  Tobacco Use  . Smoking status: Current Every Day Smoker    Packs/day: 4.00    Years: 37.00    Pack years: 148.00    Types: Cigarettes  .  Smokeless tobacco: Never Used  . Tobacco comment: down to 4ppd 11/14/20  Vaping Use  . Vaping Use: Never used  Substance and Sexual Activity  . Alcohol use: No  . Drug use: No  . Sexual activity: Never  Other Topics Concern  . Not on file  Social History Narrative   Girlfriend of six years, Rectal Cancer on Hospice (05/2011).   Social Determinants of Health   Financial Resource Strain: Not on file  Food Insecurity: Not on file  Transportation Needs: Not on file  Physical Activity: Not on file  Stress: Not on file  Social Connections: Not on file  Intimate Partner Violence: Not on file     Allergies  Allergen Reactions  . Other Shortness Of Breath    Other reaction(s): Shortness Of Breath Throat swells Throat swells  . Aspirin     REACTION: unknown reaction  . Bactrim [Sulfamethoxazole-Trimethoprim]     ABD CRAMPS AND DIARRHEA  . Codeine     REACTION:  unknown reaction  . Ibuprofen     REACTION: unknown reaction  . Oxycodone-Acetaminophen Other (See Comments)    Other reaction(s): Swelling  . Penicillins     REACTION: unknown reaction  . Remicade [Infliximab]     COULDN'T BREATHE  . Tramadol Nausea Only    Other reaction(s): Nausea  . Tramadol Hcl     REACTION: unknown reaction  . Vicodin [Hydrocodone-Acetaminophen] Nausea Only     Outpatient Medications Prior to Visit  Medication Sig Dispense Refill  . ACCU-CHEK AVIVA PLUS test strip SMARTSIG:Via Meter    . acetaminophen (TYLENOL) 500 MG tablet Take 1,000 mg by mouth every 6 (six) hours as needed. For pain    . albuterol (PROVENTIL) (2.5 MG/3ML) 0.083% nebulizer solution Take 2.5 mg by nebulization every 6 (six) hours as needed for wheezing or shortness of breath.    Marland Kitchen albuterol (VENTOLIN HFA) 108 (90 Base) MCG/ACT inhaler Inhale 2 puffs into the lungs every 6 (six) hours as needed for wheezing or shortness of breath.    Marland Kitchen atorvastatin (LIPITOR) 80 MG tablet Take 80 mg by mouth daily.    Marland Kitchen azelastine (ASTELIN) 0.1 % nasal spray Place 2 sprays into both nostrils 2 (two) times daily.    . benztropine (COGENTIN) 0.5 MG tablet Take 0.5 mg by mouth daily.     Marland Kitchen CALCIUM 600/VITAMIN D3 600-800 MG-UNIT TABS TAKE (1) TABLET BY MOUTH THREE TIMES DAILY AFTER MEALS. 84 tablet 5  . Cyanocobalamin (B-12) 1000 MCG CAPS Take by mouth daily.    Marland Kitchen dicyclomine (BENTYL) 10 MG capsule TAKE 1 OR 2 CAPSULE BY MOUTH AS DIRECTED BEFORE MEALS AND AT BEDTIME. (Patient taking differently: As needed) 60 capsule 3  . divalproex (DEPAKOTE ER) 250 MG 24 hr tablet Take 250 mg by mouth daily.     . divalproex (DEPAKOTE) 500 MG 24 hr tablet Take 1,000 mg by mouth every evening.    . docusate sodium (COLACE) 100 MG capsule Take 1 capsule (100 mg total) by mouth 2 (two) times daily. (Patient taking differently: Take 100 mg by mouth 2 (two) times daily as needed.) 10 capsule 0  . ELIQUIS 5 MG TABS tablet Take 5 mg by  mouth 2 (two) times daily.    . folic acid (FOLVITE) 1 MG tablet Take 1 mg by mouth daily.    . furosemide (LASIX) 40 MG tablet Take 40 mg by mouth daily.    Marland Kitchen gabapentin (NEURONTIN) 300 MG capsule Take 900 mg by mouth 3 (  three) times daily.    Marland Kitchen lidocaine (XYLOCAINE) 2 % solution 2 TSP  PO Q4-6H PRN FOR HEARTBURN OR UPPER ABDOMINAL PAIN 300 mL 11  . meloxicam (MOBIC) 7.5 MG tablet Take 7.5 mg by mouth daily.    . mercaptopurine (PURINETHOL) 50 MG tablet TAKE 2 TABLETS DAILY ON EMPTY STOMACH 1 HOUR BEFORE OR 2 HOURS AFTER MEAL 60 tablet 11  . metFORMIN (GLUCOPHAGE) 500 MG tablet Take by mouth 2 (two) times daily with a meal.    . mirtazapine (REMERON) 15 MG tablet Take 15 mg by mouth at bedtime.     . Misc. Devices (WALKER WHEELS) MISC USE AS DIRECTED 1 each 0  . Ostomy Supplies (ACTIVE LIFE 1-PC DRAIN [phone]) Pouch MISC     . pantoprazole (PROTONIX) 40 MG tablet TAKE (1) TABLET BY MOUTH TWICE A DAY BEFORE MEALS. (BREAKFAST AND SUPPER) 56 tablet 11  . potassium chloride SA (K-DUR,KLOR-CON) 20 MEQ tablet Take 20 mEq by mouth daily.     . risperiDONE (RISPERDAL) 1 MG tablet Take 1 tablet by mouth at bedtime.    . risperiDONE (RISPERDAL) 2 MG tablet Take 2 mg by mouth at bedtime.     . sertraline (ZOLOFT) 50 MG tablet Take 50 mg by mouth daily.    . tamsulosin (FLOMAX) 0.4 MG CAPS capsule Take 0.4 mg by mouth daily.    . traZODone (DESYREL) 50 MG tablet Take 50 mg by mouth at bedtime.    . metoprolol tartrate (LOPRESSOR) 50 MG tablet Take 1 tablet (50 mg total) by mouth once for 1 dose. PLEASE TAKE 44m (1 TABLET) 1 HOUR PRIOR TO CTA SCAN 1 tablet 0   No facility-administered medications prior to visit.    Review of Systems  Constitutional: Negative for chills, fever, malaise/fatigue and weight loss.  HENT: Negative for congestion, sinus pain and sore throat.   Eyes: Negative.   Respiratory: Positive for cough, shortness of breath and wheezing. Negative for hemoptysis and sputum production.    Cardiovascular: Negative for chest pain, palpitations, orthopnea, claudication and leg swelling.  Gastrointestinal: Negative for abdominal pain, heartburn, nausea and vomiting.  Genitourinary: Negative.   Musculoskeletal: Negative for joint pain and myalgias.  Skin: Negative for rash.  Neurological: Negative for weakness.  Endo/Heme/Allergies: Negative.   Psychiatric/Behavioral: Negative.     Objective:   Vitals:   11/14/20 1524  BP: (!) 96/56  Pulse: 75  Temp: (!) 97.3 F (36.3 C)  TempSrc: Temporal  SpO2: 98%  Weight: 198 lb 6.4 oz (90 kg)  Height: 5' 6"  (1.676 m)     Physical Exam Constitutional:      General: He is not in acute distress. HENT:     Head: Normocephalic and atraumatic.  Eyes:     Extraocular Movements: Extraocular movements intact.     Conjunctiva/sclera: Conjunctivae normal.     Pupils: Pupils are equal, round, and reactive to light.  Cardiovascular:     Rate and Rhythm: Normal rate and regular rhythm.     Pulses: Normal pulses.     Heart sounds: Normal heart sounds. No murmur heard.   Pulmonary:     Effort: Pulmonary effort is normal.     Breath sounds: Rhonchi (mild) present. No wheezing or rales.  Abdominal:     General: Bowel sounds are normal.     Palpations: Abdomen is soft.  Musculoskeletal:     Right lower leg: No edema.     Left lower leg: No edema.  Lymphadenopathy:  Cervical: No cervical adenopathy.  Skin:    General: Skin is warm and dry.  Neurological:     General: No focal deficit present.     Mental Status: He is alert.  Psychiatric:        Mood and Affect: Mood normal.        Behavior: Behavior normal.        Thought Content: Thought content normal.        Judgment: Judgment normal.    CBC    Component Value Date/Time   WBC 3.5 (L) 10/10/2020 0822   RBC 3.92 (L) 10/10/2020 0822   HGB 14.4 10/10/2020 0822   HCT 40.9 10/10/2020 0822   HCT 42 05/24/2011 1417   PLT 141 10/10/2020 0822   MCV 104.3 (H)  10/10/2020 0822   MCH 36.7 (H) 10/10/2020 0822   MCHC 35.2 10/10/2020 0822   RDW 15.1 (H) 10/10/2020 0822   LYMPHSABS 1,061 10/10/2020 0822   MONOABS 0.4 02/14/2018 0458   EOSABS 140 10/10/2020 0822   BASOSABS 11 10/10/2020 0822   BMP Latest Ref Rng & Units 10/10/2020 07/31/2020 12/04/2019  Glucose 65 - 139 mg/dL 127 100(H) 100(H)  BUN 7 - 25 mg/dL 11 7 8   Creatinine 0.70 - 1.33 mg/dL 0.75 0.77 0.72  BUN/Creat Ratio 6 - 22 (calc) NOT APPLICABLE 9 -  Sodium 456 - 146 mmol/L 139 141 143  Potassium 3.5 - 5.3 mmol/L 4.9 5.0 4.7  Chloride 98 - 110 mmol/L 99 103 106  CO2 20 - 32 mmol/L 33(H) 24 28  Calcium 8.6 - 10.3 mg/dL 10.1 9.5 8.8(L)   Chest imaging: CT Coronary 08/07/20 Calcifications in the superior vena cava 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 Nonocclusive filling defect in the distal right lower lobe pulmonary artery branch extending into a segmental sized branch. Within the visualized portions of the thorax there are no suspicious appearing pulmonary nodules or masses, there is no acute consolidative airspace disease, no pleural effusions, no pneumothorax and no lymphadenopathy.  PFT: No flowsheet data found.  Echo 11/13/20: 1. Left ventricular ejection fraction, by estimation, is 55 to 60%. The  left ventricle has normal function. Left ventricular endocardial border  not optimally defined to evaluate regional wall motion. Left ventricular  diastolic parameters were normal.  2. Left atrial size was mildly dilated.  3. The mitral valve is normal in structure. Trivial mitral valve  regurgitation.  4. The aortic valve is tricuspid. Mild aortic valve sclerosis is present,  with no evidence of aortic valve stenosis.  5. Tricuspid regurgitation signal is inadequate for assessing PA  pressure.   [place] lower extremity 06/05/20 No evidence of deep venous thrombosis seen in left lower extremity. Probable superficial thrombophlebitis seen involving the left superficial  greater saphenous vein.  Assessment & Plan:   Tobacco use  Dyspnea, unspecified type  Other pulmonary embolism without acute cor pulmonale, unspecified chronicity (HCC)  Discussion: [name] is a 55 year old male, daily smoker with history of DVT/PE, crohn's disease s/p colectomy with ileostomy and GERD who is referred to pulmonary clinic for shortness of breath.   He has a clinical history concerning for asthma given his history of wheezing, cough and dyspnea. We will start him on flovent 170mg 2 puffs twice daily and he is to continue as needed albuterol.   He is to follow up in 2 months with pulmonary function tests.   He is on eliquis for DVT/PE.   He continues to smoke daily. He is motivated to  quit and is working on cutting back even further on his own. He does not wish to try smoking cessation aids at this time as he tried them in the past.   We will refer him to lung cancer screening program based on his >40 pack year history.   Follow up in 2 months.  [name] Jackson, MD [facility] Office: [phone]   See Amion for Pager Details    Current Outpatient Medications:  .  ACCU-CHEK AVIVA PLUS test strip, SMARTSIG:Via Meter, Disp: , Rfl:  .  acetaminophen (TYLENOL) 500 MG tablet, Take 1,000 mg by mouth every 6 (six) hours as needed. For pain, Disp: , Rfl:  .  albuterol (PROVENTIL) (2.5 MG/3ML) 0.083% nebulizer solution, Take 2.5 mg by nebulization every 6 (six) hours as needed for wheezing or shortness of breath., Disp: , Rfl:  .  albuterol (VENTOLIN HFA) 108 (90 Base) MCG/ACT inhaler, Inhale 2 puffs into the lungs every 6 (six) hours as needed for wheezing or shortness of breath., Disp: , Rfl:  .  atorvastatin (LIPITOR) 80 MG tablet, Take 80 mg by mouth daily., Disp: , Rfl:  .  azelastine (ASTELIN) 0.1 % nasal spray, Place 2 sprays into both nostrils 2 (two) times daily., Disp: , Rfl:  .  benztropine (COGENTIN) 0.5 MG tablet, Take 0.5 mg by  mouth daily. , Disp: , Rfl:  .  CALCIUM 600/VITAMIN D3 600-800 MG-UNIT TABS, TAKE (1) TABLET BY MOUTH THREE TIMES DAILY AFTER MEALS., Disp: 84 tablet, Rfl: 5 .  Cyanocobalamin (B-12) 1000 MCG CAPS, Take by mouth daily., Disp: , Rfl:  .  dicyclomine (BENTYL) 10 MG capsule, TAKE 1 OR 2 CAPSULE BY MOUTH AS DIRECTED BEFORE MEALS AND AT BEDTIME. (Patient taking differently: As needed), Disp: 60 capsule, Rfl: 3 .  divalproex (DEPAKOTE ER) 250 MG 24 hr tablet, Take 250 mg by mouth daily. , Disp: , Rfl:  .  divalproex (DEPAKOTE) 500 MG 24 hr tablet, Take 1,000 mg by mouth every evening., Disp: , Rfl:  .  docusate sodium (COLACE) 100 MG capsule, Take 1 capsule (100 mg total) by mouth 2 (two) times daily. (Patient taking differently: Take 100 mg by mouth 2 (two) times daily as needed.), Disp: 10 capsule, Rfl: 0 .  ELIQUIS 5 MG TABS tablet, Take 5 mg by mouth 2 (two) times daily., Disp: , Rfl:  .  folic acid (FOLVITE) 1 MG tablet, Take 1 mg by mouth daily., Disp: , Rfl:  .  furosemide (LASIX) 40 MG tablet, Take 40 mg by mouth daily., Disp: , Rfl:  .  gabapentin (NEURONTIN) 300 MG capsule, Take 900 mg by mouth 3 (three) times daily., Disp: , Rfl:  .  lidocaine (XYLOCAINE) 2 % solution, 2 TSP  PO Q4-6H PRN FOR HEARTBURN OR UPPER ABDOMINAL PAIN, Disp: 300 mL, Rfl: 11 .  meloxicam (MOBIC) 7.5 MG tablet, Take 7.5 mg by mouth daily., Disp: , Rfl:  .  mercaptopurine (PURINETHOL) 50 MG tablet, TAKE 2 TABLETS DAILY ON EMPTY STOMACH 1 HOUR BEFORE OR 2 HOURS AFTER MEAL, Disp: 60 tablet, Rfl: 11 .  metFORMIN (GLUCOPHAGE) 500 MG tablet, Take by mouth 2 (two) times daily with a meal., Disp: , Rfl:  .  mirtazapine (REMERON) 15 MG tablet, Take 15 mg by mouth at bedtime. , Disp: , Rfl:  .  Misc. Devices (WALKER WHEELS) MISC, USE AS DIRECTED, Disp: 1 each, Rfl: 0 .  Ostomy Supplies (ACTIVE LIFE 1-PC DRAIN [phone]) Pouch MISC, , Disp: , Rfl:  .  pantoprazole (PROTONIX) 40 MG tablet, TAKE (1) TABLET BY MOUTH TWICE A DAY BEFORE  MEALS. (BREAKFAST AND SUPPER), Disp: 56 tablet, Rfl: 11 .  potassium chloride SA (K-DUR,KLOR-CON) 20 MEQ tablet, Take 20 mEq by mouth daily. , Disp: , Rfl:  .  risperiDONE (RISPERDAL) 1 MG tablet, Take 1 tablet by mouth at bedtime., Disp: , Rfl:  .  risperiDONE (RISPERDAL) 2 MG tablet, Take 2 mg by mouth at bedtime. , Disp: , Rfl:  .  sertraline (ZOLOFT) 50 MG tablet, Take 50 mg by mouth daily., Disp: , Rfl:  .  tamsulosin (FLOMAX) 0.4 MG CAPS capsule, Take 0.4 mg by mouth daily., Disp: , Rfl:  .  traZODone (DESYREL) 50 MG tablet, Take 50 mg by mouth at bedtime., Disp: , Rfl:  .  metoprolol tartrate (LOPRESSOR) 50 MG tablet, Take 1 tablet (50 mg total) by mouth once for 1 dose. PLEASE TAKE 66m (1 TABLET) 1 HOUR PRIOR TO CTA SCAN, Disp: 1 tablet, Rfl: 0

## 2020-11-17 ENCOUNTER — Other Ambulatory Visit: Payer: Self-pay

## 2020-11-17 ENCOUNTER — Inpatient Hospital Stay (HOSPITAL_COMMUNITY): Payer: Medicaid Other | Attending: Hematology

## 2020-11-17 ENCOUNTER — Other Ambulatory Visit (HOSPITAL_COMMUNITY): Payer: Self-pay | Admitting: *Deleted

## 2020-11-17 DIAGNOSIS — I82409 Acute embolism and thrombosis of unspecified deep veins of unspecified lower extremity: Secondary | ICD-10-CM

## 2020-11-17 DIAGNOSIS — Z86718 Personal history of other venous thrombosis and embolism: Secondary | ICD-10-CM | POA: Insufficient documentation

## 2020-11-17 DIAGNOSIS — Z7901 Long term (current) use of anticoagulants: Secondary | ICD-10-CM | POA: Insufficient documentation

## 2020-11-17 LAB — D-DIMER, QUANTITATIVE: D-Dimer, Quant: 0.45 ug/mL-FEU (ref 0.00–0.50)

## 2020-11-17 NOTE — Progress Notes (Signed)
Orders placed for d-dimer per Dr. Delton Coombes for labs today.

## 2020-11-20 NOTE — Progress Notes (Shared)
[place], [place] [address]   CLINIC:  Medical Oncology/Hematology  PCP:  [name], MD [address] [place] [address]  [phone]  REASON FOR VISIT:  Follow-up for recurrent DVT  PRIOR THERAPY: None  CURRENT THERAPY: Eliquis 5 mg daily  INTERVAL HISTORY:  Mr. KHASIR WOODROME, a 55 y.o. male, returns for routine follow-up for his recurrent DVT. Marik was last seen on 07/24/2020.  Today he reports feeling  REVIEW OF SYSTEMS:  Review of Systems - Oncology  PAST MEDICAL/SURGICAL HISTORY:  Past Medical History:  Diagnosis Date  . Arthritis   . Asthma   . 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 Bipolar disorder ([place])   . Colostomy in place [facility])   . Crohn's colitis ([place])    s/p total colectomy with ileostomy in 2009  . Crohn's disease of small intestine [facility]) Sept 2012   ileoscopy: multiple ulcers likely secondary to Crohn's  . DEEP VENOUS THROMBOPHLEBITIS, HX OF 02/11/2009   Qualifier: Diagnosis of  By: Zeb Comfort    . Depression   . DVT (deep venous thrombosis) ([place]) 2009   right upper extremity due to PICC  . Enterococcus UTI 2009  . GERD (gastroesophageal reflux disease)   . Hernia   . Hyperlipidemia   . Insomnia   . Migraine headache   . Nystagmus   . PULMONARY EMBOLISM, HX OF 02/11/2009   Qualifier: Diagnosis of  By: Zeb Comfort    . S/P endoscopy Sept 2012   mild gastritis, small hiatal hernia, no ulcers  . Schizophrenia ([place])   . Schizophrenia, acute [facility])    Past Surgical History:  Procedure Laterality Date  . APPENDECTOMY    . ESOPHAGOGASTRODUODENOSCOPY  07/2010   gastritis,  bx neg for H.Pylori  . ESOPHAGOGASTRODUODENOSCOPY N/A 01/08/2014   SLF:NO SOURCE FOR ODYNOPHAGIA IDENTIFIED/Multiple small ulcers in the gastric antrum  . EYE SURGERY    . ILEOSCOPY  06/29/2011   SLF: ulcers,multiple/small HH/mild gastritis  . KIDNEY SURGERY    .  LAPAROTOMY N/A 06/05/2013   Procedure: EXPLORATORY LAPAROTOMY;  Surgeon: Donato Heinz, MD;  Location: [facility];  Service: General;  Laterality: N/A;  . LYSIS OF ADHESION N/A 06/05/2013   Procedure: LYSIS OF ADHESIONS;  Surgeon: Donato Heinz, MD;  Location: [facility];  Service: General;  Laterality: N/A;  . small bowel capsule study  08/2010   few tiny nonbleeding erosions/ulcers ?secondary to relafen or Crohn's. Entire small bowel not seen.   Marland Kitchen TOTAL COLECTOMY  2009   with ileostomy at [facility] for refractory disease   . TOTAL HIP ARTHROPLASTY     right, due to avascular necrosis from chronic prednisone  . TOTAL SHOULDER REPLACEMENT     bilateral  . TRANSURETHRAL RESECTION OF PROSTATE N/A 12/03/2019   Procedure: TRANSURETHRAL RESECTION OF THE PROSTATE (TURP);  Surgeon: Cleon Gustin, MD;  Location: [facility];  Service: Urology;  Laterality: N/A;    SOCIAL HISTORY:  Social History   Socioeconomic History  . Marital status: Single    Spouse name: Not on file  . Number of children: Not on file  . Years of education: Not on file  . Highest education level: Not on file  Occupational History  . Not on file  Tobacco Use  . Smoking status: Current Every Day Smoker    Packs/day: 4.00    Years: 37.00  Pack years: 148.00    Types: Cigarettes  . Smokeless tobacco: Never Used  . Tobacco comment: down to 4ppd 11/14/20  Vaping Use  . Vaping Use: Never used  Substance and Sexual Activity  . Alcohol use: No  . Drug use: No  . Sexual activity: Never  Other Topics Concern  . Not on file  Social History Narrative   Girlfriend of six years, Rectal Cancer on Hospice (05/2011).   Social Determinants of Health   Financial Resource Strain: Not on file  Food Insecurity: Not on file  Transportation Needs: Not on file  Physical Activity: Not on file  Stress: Not on file  Social Connections: Not on file  Intimate Partner Violence: Not on file    FAMILY HISTORY:  Family History  Problem  Relation Age of Onset  . Diabetes Mother   . Heart disease Mother   . Diabetes Father   . Heart disease Father   . COPD Maternal Grandmother   . Colon cancer Neg Hx     CURRENT MEDICATIONS:  Current Outpatient Medications  Medication Sig Dispense Refill  . ACCU-CHEK AVIVA PLUS test strip SMARTSIG:Via Meter    . acetaminophen (TYLENOL) 500 MG tablet Take 1,000 mg by mouth every 6 (six) hours as needed. For pain    . albuterol (PROVENTIL) (2.5 MG/3ML) 0.083% nebulizer solution Take 2.5 mg by nebulization every 6 (six) hours as needed for wheezing or shortness of breath.    Marland Kitchen albuterol (VENTOLIN HFA) 108 (90 Base) MCG/ACT inhaler Inhale 2 puffs into the lungs every 6 (six) hours as needed for wheezing or shortness of breath.    Marland Kitchen atorvastatin (LIPITOR) 80 MG tablet Take 80 mg by mouth daily.    Marland Kitchen azelastine (ASTELIN) 0.1 % nasal spray Place 2 sprays into both nostrils 2 (two) times daily.    . benztropine (COGENTIN) 0.5 MG tablet Take 0.5 mg by mouth daily.     Marland Kitchen CALCIUM 600/VITAMIN D3 600-800 MG-UNIT TABS TAKE (1) TABLET BY MOUTH THREE TIMES DAILY AFTER MEALS. 84 tablet 5  . Cyanocobalamin (B-12) 1000 MCG CAPS Take by mouth daily.    Marland Kitchen dicyclomine (BENTYL) 10 MG capsule TAKE 1 OR 2 CAPSULE BY MOUTH AS DIRECTED BEFORE MEALS AND AT BEDTIME. (Patient taking differently: As needed) 60 capsule 3  . divalproex (DEPAKOTE ER) 250 MG 24 hr tablet Take 250 mg by mouth daily.     . divalproex (DEPAKOTE) 500 MG 24 hr tablet Take 1,000 mg by mouth every evening.    . docusate sodium (COLACE) 100 MG capsule Take 1 capsule (100 mg total) by mouth 2 (two) times daily. (Patient taking differently: Take 100 mg by mouth 2 (two) times daily as needed.) 10 capsule 0  . ELIQUIS 5 MG TABS tablet Take 5 mg by mouth 2 (two) times daily.    . fluticasone (FLOVENT HFA) 110 MCG/ACT inhaler Inhale 2 puffs into the lungs 2 (two) times daily. 1 each 12  . folic acid (FOLVITE) 1 MG tablet Take 1 mg by mouth daily.    .  furosemide (LASIX) 40 MG tablet Take 40 mg by mouth daily.    Marland Kitchen gabapentin (NEURONTIN) 300 MG capsule Take 900 mg by mouth 3 (three) times daily.    Marland Kitchen lidocaine (XYLOCAINE) 2 % solution 2 TSP  PO Q4-6H PRN FOR HEARTBURN OR UPPER ABDOMINAL PAIN 300 mL 11  . meloxicam (MOBIC) 7.5 MG tablet Take 7.5 mg by mouth daily.    . mercaptopurine (PURINETHOL) 50 MG  tablet TAKE 2 TABLETS DAILY ON EMPTY STOMACH 1 HOUR BEFORE OR 2 HOURS AFTER MEAL 60 tablet 11  . metFORMIN (GLUCOPHAGE) 500 MG tablet Take by mouth 2 (two) times daily with a meal.    . metoprolol tartrate (LOPRESSOR) 50 MG tablet Take 1 tablet (50 mg total) by mouth once for 1 dose. PLEASE TAKE 34m (1 TABLET) 1 HOUR PRIOR TO CTA SCAN 1 tablet 0  . mirtazapine (REMERON) 15 MG tablet Take 15 mg by mouth at bedtime.     . Misc. Devices (WALKER WHEELS) MISC USE AS DIRECTED 1 each 0  . Ostomy Supplies (ACTIVE LIFE 1-PC DRAIN [phone] Pouch MISC     . pantoprazole (PROTONIX) 40 MG tablet TAKE (1) TABLET BY MOUTH TWICE A DAY BEFORE MEALS. (BREAKFAST AND SUPPER) 56 tablet 11  . potassium chloride SA (K-DUR,KLOR-CON) 20 MEQ tablet Take 20 mEq by mouth daily.     . risperiDONE (RISPERDAL) 1 MG tablet Take 1 tablet by mouth at bedtime.    . risperiDONE (RISPERDAL) 2 MG tablet Take 2 mg by mouth at bedtime.     . sertraline (ZOLOFT) 50 MG tablet Take 50 mg by mouth daily.    . tamsulosin (FLOMAX) 0.4 MG CAPS capsule Take 0.4 mg by mouth daily.    . traZODone (DESYREL) 50 MG tablet Take 50 mg by mouth at bedtime.     No current facility-administered medications for this visit.    ALLERGIES:  Allergies  Allergen Reactions  . Other Shortness Of Breath    Other reaction(s): Shortness Of Breath Throat swells Throat swells  . Aspirin     REACTION: unknown reaction  . Bactrim [Sulfamethoxazole-Trimethoprim]     ABD CRAMPS AND DIARRHEA  . Codeine     REACTION: unknown reaction  . Ibuprofen     REACTION: unknown reaction  . Oxycodone-Acetaminophen  Other (See Comments)    Other reaction(s): Swelling  . Penicillins     REACTION: unknown reaction  . Remicade [Infliximab]     COULDN'T BREATHE  . Tramadol Nausea Only    Other reaction(s): Nausea  . Tramadol Hcl     REACTION: unknown reaction  . Vicodin [Hydrocodone-Acetaminophen] Nausea Only    PHYSICAL EXAM:  Performance status (ECOG): 1 - Symptomatic but completely ambulatory  There were no vitals filed for this visit. Wt Readings from Last 3 Encounters:  11/14/20 198 lb 6.4 oz (90 kg)  11/05/20 201 lb 9.6 oz (91.4 kg)  10/17/20 202 lb 4.8 oz (91.8 kg)   Physical Exam  LABORATORY DATA:  I have reviewed the labs as listed.  CBC Latest Ref Rng & Units 10/10/2020 12/04/2019 10/09/2018  WBC 3.8 - 10.8 Thousand/uL 3.5(L) 5.1 3.6(L)  Hemoglobin 13.2 - 17.1 g/dL 14.4 13.9 14.5  Hematocrit 38.5 - 50.0 % 40.9 42.3 41.4  Platelets 140 - 400 Thousand/uL 141 167 156   CMP Latest Ref Rng & Units 10/10/2020 07/31/2020 12/04/2019  Glucose 65 - 139 mg/dL 127 100(H) 100(H)  BUN 7 - 25 mg/dL 11 7 8   Creatinine 0.70 - 1.33 mg/dL 0.75 0.77 0.72  Sodium 135 - 146 mmol/L 139 141 143  Potassium 3.5 - 5.3 mmol/L 4.9 5.0 4.7  Chloride 98 - 110 mmol/L 99 103 106  CO2 20 - 32 mmol/L 33(H) 24 28  Calcium 8.6 - 10.3 mg/dL 10.1 9.5 8.8(L)  Total Protein 6.1 - 8.1 g/dL 6.8 - -  Total Bilirubin 0.2 - 1.2 mg/dL 0.6 - -  Alkaline Phos 38 - 126  U/L - - -  AST 10 - 35 U/L 30 - -  ALT 9 - 46 U/L 46 - -      Component Value Date/Time   RBC 3.92 (L) 10/10/2020 0822   MCV 104.3 (H) 10/10/2020 0822   MCH 36.7 (H) 10/10/2020 0822   MCHC 35.2 10/10/2020 0822   RDW 15.1 (H) 10/10/2020 0822   LYMPHSABS 1,061 10/10/2020 0822   MONOABS 0.4 02/14/2018 0458   EOSABS 140 10/10/2020 0822   BASOSABS 11 10/10/2020 0822    DIAGNOSTIC IMAGING:  I have independently reviewed the scans and discussed with the patient. ECHOCARDIOGRAM LIMITED  Result Date: 11/13/2020    ECHOCARDIOGRAM LIMITED REPORT   Patient Name:    [name] Date of Exam: 11/13/2020 Medical Rec #:  [id number]      Height:       66.0 in Accession #:    [id number]     Weight:       201.6 lb Date of Birth:  1965-10-06     BSA:          2.007 m Patient Age:    64 years       BP:           120/78 mmHg Patient Gender: M              HR:           79 bpm. Exam Location:  [facility] Procedure: Limited Echo, Limited Color Doppler, Color Doppler and Intracardiac            Opacification Agent Indications:    R06.00 Dyspnea  History:        Patient has prior history of Echocardiogram examinations, most                 recent 08/25/2020. 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  Sonographer:    Lenard Galloway [facility], RDCS Referring Phys: [id number] Elouise Munroe  Sonographer Comments: Technically difficult study due to poor echo windows. IMPRESSIONS  1. Left ventricular ejection fraction, by estimation, is 55 to 60%. The left ventricle has normal function. Left ventricular endocardial border not optimally defined to evaluate regional wall motion. Left ventricular diastolic parameters were normal.  2. Left atrial size was mildly dilated.  3. The mitral valve is normal in structure. Trivial mitral valve regurgitation.  4. The aortic valve is tricuspid. Mild aortic valve sclerosis is present, with no evidence of aortic valve stenosis.  5. Tricuspid regurgitation signal is inadequate for assessing PA pressure. FINDINGS  Left Ventricle: Left ventricular ejection fraction, by estimation, is 55 to 60%. The left ventricle has normal function. Left ventricular endocardial border not optimally defined to evaluate regional wall motion. Definity contrast agent was given IV to delineate the left ventricular endocardial borders. Left ventricular diastolic parameters were normal. Normal left ventricular filling  pressure. Right Ventricle: Tricuspid regurgitation signal is inadequate for assessing PA pressure. Left Atrium: Left atrial size was mildly dilated. Mitral Valve: The mitral valve is normal in structure. Trivial mitral valve regurgitation. Aortic Valve: The aortic valve is tricuspid. Mild aortic valve sclerosis is present, with no evidence of aortic valve stenosis. Pulmonic Valve: Pulmonic valve regurgitation is trivial. LEFT VENTRICLE  PLAX 2D LVIDd:         5.40 cm  Diastology LVIDs:         3.80 cm  LV e' medial:    7.29 cm/s LV PW:         0.70 cm  LV E/e' medial:  13.3 LV IVS:        0.60 cm  LV e' lateral:   15.70 cm/s LVOT diam:     2.20 cm  LV E/e' lateral: 6.2 LV SV:         57 LV SV Index:   28 LVOT Area:     3.80 cm  RIGHT VENTRICLE RV S prime:     10.60 cm/s TAPSE (M-mode): 2.8 cm LEFT ATRIUM             Index LA diam:        3.50 cm 1.74 cm/m LA Vol (A2C):   82.4 ml 41.06 ml/m LA Vol (A4C):   61.3 ml 30.55 ml/m LA Biplane Vol: 71.3 ml 35.53 ml/m  AORTIC VALVE LVOT Vmax:   71.80 cm/s LVOT Vmean:  50.100 cm/s LVOT VTI:    0.149 m  AORTA Ao Root diam: 3.10 cm MITRAL VALVE MV Area (PHT): 4.36 cm    SHUNTS MV Decel Time: 174 msec    Systemic VTI:  0.15 m MV E velocity: 97.30 cm/s  Systemic Diam: 2.20 cm MV A velocity: 95.50 cm/s MV E/A ratio:  1.02 Fransico Him MD Electronically signed by Fransico Him MD Signature Date/Time: 11/13/2020/5:16:10 PM    Final      ASSESSMENT:  1.  Recurrent DVT: -First blood clot was in left upper extremity back in 2009????.  Unable to find imaging or medical results-we will see the patient can obtain. -Second blood clot was in September 2021 which initially showed superficial thrombus phlebitis-symptoms worsened and repeat imaging showed acute and extensive left lower extremity embolus.  He was started on Eliquis 5 mg twice daily. -Has CT scan of his chest with angiography to rule out pulmonary embolus by PCP scheduled for next month. -Has family history of  nonprovoked DVTs in several family members on his mother's side.  No coagulable work-up per patient. -No history of stroke or heart attack. -Bilateral knee replacements back in 2015 and bilateral shoulder replacement back in 2007.    PLAN:  1.  Recurrent DVT: -Patient will likely be on anticoagulation for the rest of his life.  He is tolerating Eliquis 5 mg twice daily well. -No hypercoagulable work-up done to date. -Have asked that patient obtain imaging and medical records from [facility] to clarify initial DVT in left upper extremity.  If it is indeed unprovoked and a deep vein thrombus, patient does not need hypercoagulable work-up for hereditary mutations such as factor V Leiden.  -He is also currently on anticoagulation which needs that work-up may be skewed secondary to medication.  We will hold off at this time and obtain medical records. -We will have him return to clinic in about 4 months.  -He will continue Eliquis 5 mg twice daily.  Orders placed this encounter:  No orders of the defined types were placed in this encounter.    Derek Jack, MD [place] [phone]   I, Milinda Antis, am acting as a scribe for Dr. Sanda Linger.  {Add Barista Statement}

## 2020-11-24 ENCOUNTER — Inpatient Hospital Stay (HOSPITAL_COMMUNITY): Payer: Medicaid Other | Admitting: Hematology

## 2020-11-24 DIAGNOSIS — I82409 Acute embolism and thrombosis of unspecified deep veins of unspecified lower extremity: Secondary | ICD-10-CM

## 2020-11-27 ENCOUNTER — Other Ambulatory Visit: Payer: Self-pay

## 2020-11-27 ENCOUNTER — Inpatient Hospital Stay (HOSPITAL_BASED_OUTPATIENT_CLINIC_OR_DEPARTMENT_OTHER): Payer: Medicaid Other | Admitting: Hematology

## 2020-11-27 VITALS — BP 142/90 | HR 72 | Temp 96.8°F | Resp 20 | Wt 201.8 lb

## 2020-11-27 DIAGNOSIS — I82409 Acute embolism and thrombosis of unspecified deep veins of unspecified lower extremity: Secondary | ICD-10-CM | POA: Diagnosis not present

## 2020-11-27 DIAGNOSIS — Z86718 Personal history of other venous thrombosis and embolism: Secondary | ICD-10-CM | POA: Diagnosis not present

## 2020-11-27 NOTE — Patient Instructions (Signed)
[place] at [facility] Discharge Instructions  You were seen today by Dr. Delton Coombes. He went over your recent results. You will be scheduled to have a doppler ultrasound of your left leg to check for a possible blood clot. Dr. Delton Coombes will call you after the scan for follow up.   Thank you for choosing [place] at [facility] to provide your oncology and hematology care.  To afford each patient quality time with our provider, please arrive at least 15 minutes before your scheduled appointment time.   If you have a lab appointment with the [facility] please come in thru the Main Entrance and check in at the main information desk  You need to re-schedule your appointment should you arrive 10 or more minutes late.  We strive to give you quality time with our providers, and arriving late affects you and other patients whose appointments are after yours.  Also, if you no show three or more times for appointments you may be dismissed from the clinic at the providers discretion.     Again, thank you for choosing [facility].  Our hope is that these requests will decrease the amount of time that you wait before being seen by our physicians.       _____________________________________________________________  Should you have questions after your visit to [facility], please contact our office at [phone] between the hours of 8:00 a.m. and 4:30 p.m.  Voicemails left after 4:00 p.m. will not be returned until the following business day.  For prescription refill requests, have your pharmacy contact our office and allow 72 hours.    Cancer Center Support Programs:   > Cancer Support Group  2nd Tuesday of the month 1pm-2pm, Journey Room

## 2020-11-27 NOTE — Progress Notes (Signed)
[place], [place] [address]   CLINIC:  Medical Oncology/Hematology  PCP:  [name], MD [address] [place] [address]  [phone]  REASON FOR VISIT:  Follow-up for recurrent DVT's  PRIOR THERAPY: None  CURRENT THERAPY: Eliquis 5 mg BID  INTERVAL HISTORY:  [name], a 55 y.o. male, returns for routine follow-up for his recurrent DVT's. [name] was last seen by Faythe Casa, NP, on 07/24/2020.  Today he reports feeling fair. He complains of having pain in his left leg posterior mid-tibia which started recently and is worse when he walks; elevating helps alleviate the pain. He is tolerating Eliquis 5 mg BID well and denies having nosebleeds, hematochezia or hematuria, though he has some bruises on his left forearm.   REVIEW OF SYSTEMS:  Review of Systems  Constitutional: Positive for appetite change (50%) and fatigue (depleted).  HENT:   Negative for nosebleeds.   Respiratory: Positive for shortness of breath (at rest).   Gastrointestinal: Negative for blood in stool.  Genitourinary: Negative for hematuria.   Musculoskeletal: Positive for arthralgias (7/10 L leg pain).  Neurological: Positive for dizziness.  Hematological: Bruises/bleeds easily (bruising on L forearm).  Psychiatric/Behavioral: Positive for sleep disturbance (intermittent).  All other systems reviewed and are negative.   PAST MEDICAL/SURGICAL HISTORY:  Past Medical History:  Diagnosis Date  . Arthritis   . Asthma   . 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 Bipolar disorder ([place])   . Colostomy in place [facility])   . Crohn's colitis ([place])    s/p total colectomy with ileostomy in 2009  . Crohn's disease of small intestine [facility]) Sept 2012   ileoscopy: multiple ulcers likely secondary to Crohn's  . DEEP VENOUS THROMBOPHLEBITIS, HX OF 02/11/2009   Qualifier: Diagnosis of  By: Zeb Comfort    .  Depression   . DVT (deep venous thrombosis) ([place]) 2009   right upper extremity due to PICC  . Enterococcus UTI 2009  . GERD (gastroesophageal reflux disease)   . Hernia   . Hyperlipidemia   . Insomnia   . Migraine headache   . Nystagmus   . PULMONARY EMBOLISM, HX OF 02/11/2009   Qualifier: Diagnosis of  By: Zeb Comfort    . S/P endoscopy Sept 2012   mild gastritis, small hiatal hernia, no ulcers  . Schizophrenia ([place])   . Schizophrenia, acute [facility])    Past Surgical History:  Procedure Laterality Date  . APPENDECTOMY    . ESOPHAGOGASTRODUODENOSCOPY  07/2010   gastritis,  bx neg for H.Pylori  . ESOPHAGOGASTRODUODENOSCOPY N/A 01/08/2014   SLF:NO SOURCE FOR ODYNOPHAGIA IDENTIFIED/Multiple small ulcers in the gastric antrum  . EYE SURGERY    . ILEOSCOPY  06/29/2011   SLF: ulcers,multiple/small HH/mild gastritis  . KIDNEY SURGERY    . LAPAROTOMY N/A 06/05/2013   Procedure: EXPLORATORY LAPAROTOMY;  Surgeon: Donato Heinz, MD;  Location: [facility];  Service: General;  Laterality: N/A;  . LYSIS OF ADHESION N/A 06/05/2013   Procedure: LYSIS OF ADHESIONS;  Surgeon: Donato Heinz, MD;  Location: [facility];  Service: General;  Laterality: N/A;  . small bowel capsule study  08/2010   few tiny nonbleeding erosions/ulcers ?secondary to relafen or Crohn's. Entire small bowel not seen.   Marland Kitchen TOTAL COLECTOMY  2009   with ileostomy at [facility] for refractory disease   . TOTAL HIP ARTHROPLASTY  right, due to avascular necrosis from chronic prednisone  . TOTAL SHOULDER REPLACEMENT     bilateral  . TRANSURETHRAL RESECTION OF PROSTATE N/A 12/03/2019   Procedure: TRANSURETHRAL RESECTION OF THE PROSTATE (TURP);  Surgeon: Cleon Gustin, MD;  Location: [facility];  Service: Urology;  Laterality: N/A;    SOCIAL HISTORY:  Social History   Socioeconomic History  . Marital status: Single    Spouse name: Not on file  . Number of children: Not on file  . Years of education: Not on file  . Highest  education level: Not on file  Occupational History  . Not on file  Tobacco Use  . Smoking status: Current Every Day Smoker    Packs/day: 4.00    Years: 37.00    Pack years: 148.00    Types: Cigarettes  . Smokeless tobacco: Never Used  . Tobacco comment: down to 4ppd 11/14/20  Vaping Use  . Vaping Use: Never used  Substance and Sexual Activity  . Alcohol use: No  . Drug use: No  . Sexual activity: Never  Other Topics Concern  . Not on file  Social History Narrative   Girlfriend of six years, Rectal Cancer on Hospice (05/2011).   Social Determinants of Health   Financial Resource Strain: Not on file  Food Insecurity: Not on file  Transportation Needs: Not on file  Physical Activity: Not on file  Stress: Not on file  Social Connections: Not on file  Intimate Partner Violence: Not on file    FAMILY HISTORY:  Family History  Problem Relation Age of Onset  . Diabetes Mother   . Heart disease Mother   . Diabetes Father   . Heart disease Father   . COPD Maternal Grandmother   . Colon cancer Neg Hx     CURRENT MEDICATIONS:  Current Outpatient Medications  Medication Sig Dispense Refill  . ACCU-CHEK AVIVA PLUS test strip SMARTSIG:Via Meter    . acetaminophen (TYLENOL) 500 MG tablet Take 1,000 mg by mouth every 6 (six) hours as needed. For pain    . albuterol (PROVENTIL) (2.5 MG/3ML) 0.083% nebulizer solution Take 2.5 mg by nebulization every 6 (six) hours as needed for wheezing or shortness of breath.    Marland Kitchen albuterol (VENTOLIN HFA) 108 (90 Base) MCG/ACT inhaler Inhale 2 puffs into the lungs every 6 (six) hours as needed for wheezing or shortness of breath.    Marland Kitchen atorvastatin (LIPITOR) 80 MG tablet Take 80 mg by mouth daily.    Marland Kitchen azelastine (ASTELIN) 0.1 % nasal spray Place 2 sprays into both nostrils 2 (two) times daily.    . benztropine (COGENTIN) 0.5 MG tablet Take 0.5 mg by mouth daily.     Marland Kitchen CALCIUM 600/VITAMIN D3 600-800 MG-UNIT TABS TAKE (1) TABLET BY MOUTH THREE TIMES  DAILY AFTER MEALS. 84 tablet 5  . Cyanocobalamin (B-12) 1000 MCG CAPS Take by mouth daily.    Marland Kitchen dicyclomine (BENTYL) 10 MG capsule TAKE 1 OR 2 CAPSULE BY MOUTH AS DIRECTED BEFORE MEALS AND AT BEDTIME. (Patient taking differently: As needed) 60 capsule 3  . divalproex (DEPAKOTE ER) 250 MG 24 hr tablet Take 250 mg by mouth daily.     . divalproex (DEPAKOTE) 500 MG 24 hr tablet Take 1,000 mg by mouth every evening.    . docusate sodium (COLACE) 100 MG capsule Take 1 capsule (100 mg total) by mouth 2 (two) times daily. (Patient taking differently: Take 100 mg by mouth 2 (two) times daily as needed.) 10 capsule  0  . ELIQUIS 5 MG TABS tablet Take 5 mg by mouth 2 (two) times daily.    . fluticasone (FLOVENT HFA) 110 MCG/ACT inhaler Inhale 2 puffs into the lungs 2 (two) times daily. 1 each 12  . folic acid (FOLVITE) 1 MG tablet Take 1 mg by mouth daily.    . furosemide (LASIX) 40 MG tablet Take 40 mg by mouth daily.    Marland Kitchen gabapentin (NEURONTIN) 300 MG capsule Take 900 mg by mouth 3 (three) times daily.    Marland Kitchen lidocaine (XYLOCAINE) 2 % solution 2 TSP  PO Q4-6H PRN FOR HEARTBURN OR UPPER ABDOMINAL PAIN 300 mL 11  . meloxicam (MOBIC) 7.5 MG tablet Take 7.5 mg by mouth daily.    . mercaptopurine (PURINETHOL) 50 MG tablet TAKE 2 TABLETS DAILY ON EMPTY STOMACH 1 HOUR BEFORE OR 2 HOURS AFTER MEAL 60 tablet 11  . metFORMIN (GLUCOPHAGE) 500 MG tablet Take by mouth 2 (two) times daily with a meal.    . metoprolol tartrate (LOPRESSOR) 50 MG tablet Take 1 tablet (50 mg total) by mouth once for 1 dose. PLEASE TAKE 41m (1 TABLET) 1 HOUR PRIOR TO CTA SCAN 1 tablet 0  . mirtazapine (REMERON) 15 MG tablet Take 15 mg by mouth at bedtime.     . Misc. Devices (WALKER WHEELS) MISC USE AS DIRECTED 1 each 0  . Ostomy Supplies (ACTIVE LIFE 1-PC DRAIN [phone] Pouch MISC     . pantoprazole (PROTONIX) 40 MG tablet TAKE (1) TABLET BY MOUTH TWICE A DAY BEFORE MEALS. (BREAKFAST AND SUPPER) 56 tablet 11  . potassium chloride SA  (K-DUR,KLOR-CON) 20 MEQ tablet Take 20 mEq by mouth daily.     . risperiDONE (RISPERDAL) 1 MG tablet Take 1 tablet by mouth at bedtime.    . risperiDONE (RISPERDAL) 2 MG tablet Take 2 mg by mouth at bedtime.     . sertraline (ZOLOFT) 50 MG tablet Take 50 mg by mouth daily.    . tamsulosin (FLOMAX) 0.4 MG CAPS capsule Take 0.4 mg by mouth daily.    . traZODone (DESYREL) 50 MG tablet Take 50 mg by mouth at bedtime.     No current facility-administered medications for this visit.    ALLERGIES:  Allergies  Allergen Reactions  . Other Shortness Of Breath    Other reaction(s): Shortness Of Breath Throat swells Throat swells  . Aspirin     REACTION: unknown reaction  . Bactrim [Sulfamethoxazole-Trimethoprim]     ABD CRAMPS AND DIARRHEA  . Codeine     REACTION: unknown reaction  . Ibuprofen     REACTION: unknown reaction  . Oxycodone-Acetaminophen Other (See Comments)    Other reaction(s): Swelling  . Penicillins     REACTION: unknown reaction  . Remicade [Infliximab]     COULDN'T BREATHE  . Tramadol Nausea Only    Other reaction(s): Nausea  . Tramadol Hcl     REACTION: unknown reaction  . Vicodin [Hydrocodone-Acetaminophen] Nausea Only    PHYSICAL EXAM:  Performance status (ECOG): 1 - Symptomatic but completely ambulatory  Vitals:   11/27/20 1458  BP: (!) 142/90  Pulse: 72  Resp: 20  Temp: (!) 96.8 F (36 C)  SpO2: 98%   Wt Readings from Last 3 Encounters:  11/27/20 201 lb 12.8 oz (91.5 kg)  11/14/20 198 lb 6.4 oz (90 kg)  11/05/20 201 lb 9.6 oz (91.4 kg)   Physical Exam Musculoskeletal:     Right lower leg: No swelling.     Left  lower leg: Swelling present.     LABORATORY DATA:  I have reviewed the labs as listed.  CBC Latest Ref Rng & Units 10/10/2020 12/04/2019 10/09/2018  WBC 3.8 - 10.8 Thousand/uL 3.5(L) 5.1 3.6(L)  Hemoglobin 13.2 - 17.1 g/dL 14.4 13.9 14.5  Hematocrit 38.5 - 50.0 % 40.9 42.3 41.4  Platelets 140 - 400 Thousand/uL 141 167 156   CMP Latest  Ref Rng & Units 10/10/2020 07/31/2020 12/04/2019  Glucose 65 - 139 mg/dL 127 100(H) 100(H)  BUN 7 - 25 mg/dL 11 7 8   Creatinine 0.70 - 1.33 mg/dL 0.75 0.77 0.72  Sodium 135 - 146 mmol/L 139 141 143  Potassium 3.5 - 5.3 mmol/L 4.9 5.0 4.7  Chloride 98 - 110 mmol/L 99 103 106  CO2 20 - 32 mmol/L 33(H) 24 28  Calcium 8.6 - 10.3 mg/dL 10.1 9.5 8.8(L)  Total Protein 6.1 - 8.1 g/dL 6.8 - -  Total Bilirubin 0.2 - 1.2 mg/dL 0.6 - -  Alkaline Phos 38 - 126 U/L - - -  AST 10 - 35 U/L 30 - -  ALT 9 - 46 U/L 46 - -      Component Value Date/Time   RBC 3.92 (L) 10/10/2020 0822   MCV 104.3 (H) 10/10/2020 0822   MCH 36.7 (H) 10/10/2020 0822   MCHC 35.2 10/10/2020 0822   RDW 15.1 (H) 10/10/2020 0822   LYMPHSABS 1,061 10/10/2020 0822   MONOABS 0.4 02/14/2018 0458   EOSABS 140 10/10/2020 0822   BASOSABS 11 10/10/2020 0822    DIAGNOSTIC IMAGING:  I have independently reviewed the scans and discussed with the patient. ECHOCARDIOGRAM LIMITED  Result Date: 11/13/2020    ECHOCARDIOGRAM LIMITED REPORT   Patient Name:   [name] Date of Exam: 11/13/2020 Medical Rec #:  [id number]      Height:       66.0 in Accession #:    [id number]     Weight:       201.6 lb Date of Birth:  Oct 03, 1966     BSA:          2.007 m Patient Age:    76 years       BP:           120/78 mmHg Patient Gender: M              HR:           79 bpm. Exam Location:  [facility] Procedure: Limited Echo, Limited Color Doppler, Color Doppler and Intracardiac            Opacification Agent Indications:    R06.00 Dyspnea  History:        Patient has prior history of Echocardiogram examinations, most                 recent 08/25/2020. 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  Sonographer:    Lenard Galloway [facility], RDCS Referring Phys: [id number] Elouise Munroe  Sonographer Comments:  Technically difficult study due to poor echo windows. IMPRESSIONS  1. Left ventricular ejection fraction, by estimation, is 55 to 60%. The left ventricle has normal function. Left ventricular endocardial border not optimally defined to evaluate regional wall motion. Left ventricular  diastolic parameters were normal.  2. Left atrial size was mildly dilated.  3. The mitral valve is normal in structure. Trivial mitral valve regurgitation.  4. The aortic valve is tricuspid. Mild aortic valve sclerosis is present, with no evidence of aortic valve stenosis.  5. Tricuspid regurgitation signal is inadequate for assessing PA pressure. FINDINGS  Left Ventricle: Left ventricular ejection fraction, by estimation, is 55 to 60%. The left ventricle has normal function. Left ventricular endocardial border not optimally defined to evaluate regional wall motion. Definity contrast agent was given IV to delineate the left ventricular endocardial borders. Left ventricular diastolic parameters were normal. Normal left ventricular filling pressure. Right Ventricle: Tricuspid regurgitation signal is inadequate for assessing PA pressure. Left Atrium: Left atrial size was mildly dilated. Mitral Valve: The mitral valve is normal in structure. Trivial mitral valve regurgitation. Aortic Valve: The aortic valve is tricuspid. Mild aortic valve sclerosis is present, with no evidence of aortic valve stenosis. Pulmonic Valve: Pulmonic valve regurgitation is trivial. LEFT VENTRICLE PLAX 2D LVIDd:         5.40 cm  Diastology LVIDs:         3.80 cm  LV e' medial:    7.29 cm/s LV PW:         0.70 cm  LV E/e' medial:  13.3 LV IVS:        0.60 cm  LV e' lateral:   15.70 cm/s LVOT diam:     2.20 cm  LV E/e' lateral: 6.2 LV SV:         57 LV SV Index:   28 LVOT Area:     3.80 cm  RIGHT VENTRICLE RV S prime:     10.60 cm/s TAPSE (M-mode): 2.8 cm LEFT ATRIUM             Index LA diam:        3.50 cm 1.74 cm/m LA Vol (A2C):   82.4 ml 41.06 ml/m LA Vol  (A4C):   61.3 ml 30.55 ml/m LA Biplane Vol: 71.3 ml 35.53 ml/m  AORTIC VALVE LVOT Vmax:   71.80 cm/s LVOT Vmean:  50.100 cm/s LVOT VTI:    0.149 m  AORTA Ao Root diam: 3.10 cm MITRAL VALVE MV Area (PHT): 4.36 cm    SHUNTS MV Decel Time: 174 msec    Systemic VTI:  0.15 m MV E velocity: 97.30 cm/s  Systemic Diam: 2.20 cm MV A velocity: 95.50 cm/s MV E/A ratio:  1.02 Fransico Him MD Electronically signed by Fransico Him MD Signature Date/Time: 11/13/2020/5:16:10 PM    Final      ASSESSMENT:  1.  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 -He reports left upper extremity DVT happened after colostomy. -Lower extremity Doppler on 06/05/2020 with no evidence of DVT.  Probable superficial thrombophlebitis involving the left superficial greater saphenous vein. -Symptoms worsened and repeat imaging reportedly showed acute and extensive left lower extremity embolus.  I do not see any imaging for this. -He is currently on Eliquis. -Family history of unprovoked DVTs and several family members on his mother side.    PLAN:  1.  Recurrent DVT in left lower leg: -Reported left calf pain for the past few weeks. -Left calf is half inch bigger than the right side with chronic venous stasis changes with mild erythema. -Reviewed his D-dimer from 11/17/2020 which was 0.45. -Because of the  pain, I have recommended left leg Doppler. -If there is documentation that he had left lower extremity unprovoked in September 2021, he will benefit from indefinite anticoagulation with intermittent assessment of risk versus benefit ratio. -We will have a phone visit after the Doppler.   Orders placed this encounter:  No orders of the defined types were placed in this encounter.    Derek Jack, MD [place] [phone]   I, Milinda Antis, am acting as a scribe for Dr.  Sanda Linger.  I, Derek Jack MD, have reviewed the above documentation for accuracy and completeness, and I agree with the above.

## 2020-12-02 ENCOUNTER — Other Ambulatory Visit: Payer: Self-pay

## 2020-12-02 ENCOUNTER — Ambulatory Visit (HOSPITAL_COMMUNITY)
Admission: RE | Admit: 2020-12-02 | Discharge: 2020-12-02 | Disposition: A | Discharge status: Home / Self Care | Payer: Medicaid Other | Source: Ambulatory Visit | Attending: Hematology | Admitting: Hematology

## 2020-12-02 DIAGNOSIS — I82409 Acute embolism and thrombosis of unspecified deep veins of unspecified lower extremity: Secondary | ICD-10-CM | POA: Insufficient documentation

## 2020-12-04 ENCOUNTER — Other Ambulatory Visit: Payer: Self-pay

## 2020-12-04 ENCOUNTER — Inpatient Hospital Stay (HOSPITAL_COMMUNITY): Payer: Medicaid Other | Attending: Hematology | Admitting: Hematology

## 2020-12-04 DIAGNOSIS — I82409 Acute embolism and thrombosis of unspecified deep veins of unspecified lower extremity: Secondary | ICD-10-CM | POA: Diagnosis not present

## 2020-12-04 MED ORDER — SULFAMETHOXAZOLE-TRIMETHOPRIM 800-160 MG PO TABS: 1.0000 | ORAL_TABLET | Freq: Two times a day (BID) | ORAL | 0 refills | Status: DC

## 2020-12-04 NOTE — Progress Notes (Signed)
Virtual Visit via Telephone Note  I connected with [name] on 12/04/20 at  4:15 PM EST by telephone and verified that I am speaking with the correct person using two identifiers.   Location: Patient: At home Provider: In the office   I discussed the limitations, risks, security and privacy concerns of performing an evaluation and management service by telephone and the availability of in person appointments. I also discussed with the patient that there may be a patient responsible charge related to this service. The patient expressed understanding and agreed to proceed.   History of Present Illness: Mr. Dewalt is seen for recurrent DVT in the left lower leg. Left upper extremity DVT on 01/31/2008 with extensive DVT involving left upper extremity extending into the left jugular vein.  This happened after colostomy.  Unprovoked DVT of the left leg on 06/19/2020, with extension from superficial thrombophlebitis involving left superficial greater saphenous vein on 06/05/2020.   Observations/Objective: He reports pain and redness in the left leg extending from the dorsum of the foot.  Denies any fevers or chills.  Denies any bleeding.  He is taking Eliquis without fail.  Assessment and Plan:  1.  Unprovoked left leg DVT: -On 06/05/2020-Doppler showed superficial thrombophlebitis involving left superficial greater saphenous vein. -On 06/19/2020-Doppler done in person [place] showed positive DVT involving common femoral, profundofemoral, superficial femoral veins. -Patient was started on Eliquis at that time. -Repeat Doppler in our office on 12/02/2020 showed nonocclusive thrombus noted within the common femoral, profundofemoral and superficial femoral veins, consistent with chronic recanalized thrombus.  No evidence of thrombus in the popliteal vein or calf veins.  Normal superficial great saphenous vein. -His left leg DVT was unprovoked.  He had 1 episode of provoked left upper extremity DVT after  colostomy. -Based on this I have recommended indefinite anticoagulation. -I have recommended Bactrim 1 tablet twice daily for 7 days for possible cellulitis of the left lower extremity which is red and hot. -RTC 6 months for follow-up.   Follow Up Instructions: RTC 6 months with repeat D-dimer.   I discussed the assessment and treatment plan with the patient. The patient was provided an opportunity to ask questions and all were answered. The patient agreed with the plan and demonstrated an understanding of the instructions.   The patient was advised to call back or seek an in-person evaluation if the symptoms worsen or if the condition fails to improve as anticipated.  I provided 12 minutes of non-face-to-face time during this encounter.   Derek Jack, MD

## 2020-12-19 ENCOUNTER — Other Ambulatory Visit: Payer: Self-pay | Admitting: Nurse Practitioner

## 2020-12-29 ENCOUNTER — Telehealth: Payer: Self-pay | Admitting: *Deleted

## 2020-12-29 DIAGNOSIS — R002 Palpitations: Secondary | ICD-10-CM

## 2020-12-29 NOTE — Telephone Encounter (Addendum)
Referral placed.  pt aware of results    ---- Message from Elouise Munroe, MD sent at 12/27/2020 11:58 PM EDT ----- Pause 3.7 sec (likely while sleeping) and overall infrequent SVT- though SVT may be the source of his palpitations. Please offer patient EP referral to further review the pause which was significant.

## 2021-01-06 ENCOUNTER — Other Ambulatory Visit (HOSPITAL_COMMUNITY)
Admission: RE | Admit: 2021-01-06 | Discharge: 2021-01-06 | Disposition: A | Discharge status: Home / Self Care | Payer: Medicaid Other | Source: Ambulatory Visit | Attending: Pulmonary Disease | Admitting: Pulmonary Disease

## 2021-01-06 DIAGNOSIS — Z01812 Encounter for preprocedural laboratory examination: Secondary | ICD-10-CM | POA: Insufficient documentation

## 2021-01-06 DIAGNOSIS — Z20822 Contact with and (suspected) exposure to covid-19: Secondary | ICD-10-CM | POA: Insufficient documentation

## 2021-01-07 ENCOUNTER — Telehealth: Payer: Self-pay | Admitting: Internal Medicine

## 2021-01-07 LAB — SARS CORONAVIRUS 2 (TAT 6-24 HRS): SARS Coronavirus 2: NEGATIVE

## 2021-01-07 NOTE — Telephone Encounter (Signed)
Please ask the patient what he is currently taking for constipation.  I can make further recommendations when I know what he's on.

## 2021-01-07 NOTE — Telephone Encounter (Signed)
[phone] please call patient, he says his "bowels are messed up" and he is full after just a few bites of food

## 2021-01-07 NOTE — Telephone Encounter (Signed)
Returned pt's call and he advises me he is very constipated and he's been this way 3 or 4 days. His entire abdomen hurts, no nausea. Pt can only eat a little at a time. Pt states even drinking a cup of coffee doesn't help. (it use too). Please advise

## 2021-01-08 NOTE — Telephone Encounter (Signed)
Phoned the pt and he advised me that he doesn't take anything for his constipation.

## 2021-01-09 ENCOUNTER — Ambulatory Visit (INDEPENDENT_AMBULATORY_CARE_PROVIDER_SITE_OTHER): Payer: Medicaid Other | Admitting: Pulmonary Disease

## 2021-01-09 ENCOUNTER — Encounter: Payer: Self-pay | Admitting: Pulmonary Disease

## 2021-01-09 ENCOUNTER — Other Ambulatory Visit: Payer: Self-pay

## 2021-01-09 VITALS — BP 114/78 | HR 74 | Temp 97.4°F | Ht 66.0 in | Wt 195.0 lb

## 2021-01-09 DIAGNOSIS — J4489 Other specified chronic obstructive pulmonary disease: Secondary | ICD-10-CM

## 2021-01-09 DIAGNOSIS — R0600 Dyspnea, unspecified: Secondary | ICD-10-CM | POA: Diagnosis not present

## 2021-01-09 DIAGNOSIS — J441 Chronic obstructive pulmonary disease with (acute) exacerbation: Secondary | ICD-10-CM

## 2021-01-09 DIAGNOSIS — J449 Chronic obstructive pulmonary disease, unspecified: Secondary | ICD-10-CM

## 2021-01-09 LAB — PULMONARY FUNCTION TEST
DL/VA % pred: 85 %
DL/VA: 3.77 ml/min/mmHg/L
DLCO cor % pred: 90 %
DLCO cor: 22.68 ml/min/mmHg
DLCO unc % pred: 90 %
DLCO unc: 22.68 ml/min/mmHg
FEF 25-75 Post: 1.98 L/sec
FEF 25-75 Pre: 1.05 L/sec
FEF2575-%Change-Post: 89 %
FEF2575-%Pred-Post: 69 %
FEF2575-%Pred-Pre: 36 %
FEV1-%Change-Post: 30 %
FEV1-%Pred-Post: 69 %
FEV1-%Pred-Pre: 52 %
FEV1-Post: 2.27 L
FEV1-Pre: 1.73 L
FEV1FVC-%Change-Post: 12 %
FEV1FVC-%Pred-Pre: 76 %
FEV6-%Change-Post: 14 %
FEV6-%Pred-Post: 82 %
FEV6-%Pred-Pre: 72 %
FEV6-Post: 3.36 L
FEV6-Pre: 2.94 L
FEV6FVC-%Change-Post: 0 %
FEV6FVC-%Pred-Post: 104 %
FEV6FVC-%Pred-Pre: 104 %
FVC-%Change-Post: 16 %
FVC-%Pred-Post: 80 %
FVC-%Pred-Pre: 68 %
FVC-Post: 3.42 L
FVC-Pre: 2.94 L
Post FEV1/FVC ratio: 66 %
Post FEV6/FVC ratio: 100 %
Pre FEV1/FVC ratio: 59 %
Pre FEV6/FVC Ratio: 100 %
RV % pred: 203 %
RV: 3.91 L
TLC % pred: 110 %
TLC: 6.84 L

## 2021-01-09 MED ORDER — TRELEGY ELLIPTA 100-62.5-25 MCG/INH IN AEPB: 1.0000 | INHALATION_SPRAY | Freq: Every day | RESPIRATORY_TRACT | 0 refills | Status: DC

## 2021-01-09 MED ORDER — TRELEGY ELLIPTA 100-62.5-25 MCG/INH IN AEPB: 1.0000 | INHALATION_SPRAY | Freq: Every day | RESPIRATORY_TRACT | 6 refills | Status: DC

## 2021-01-09 NOTE — Progress Notes (Signed)
Full PFT performed today. °

## 2021-01-09 NOTE — Patient Instructions (Signed)
Full  pulmonary function test performed today.

## 2021-01-09 NOTE — Progress Notes (Signed)
Synopsis: Referred in January 2022 by Cherlynn Kaiser, MD for shortness of breath  Subjective:   PATIENT ID: [name] GENDER: male DOB: Jan 20, 1966, MRN: [id number]   HPI  Chief Complaint  Patient presents with  . Follow-up    F/U after PFT. States his breathing has been stable since last visit.    [name] is a 55 year old male, daily smoker with history of DVT/PE, crohn's disease s/p colectomy with ileostomy and GERD who returns to pulmonary clinic for shortness of breath.   Hist breathing has been about the same since last visit. He did notice some improvement with adding flovent 133mg 2 puffs twice daily at last visit with a reduction in albuterol use.   He continues to smoke daily about 0.5 to 1 pack per day.    Pulmonary function tests today moderate obstructive defect with significant bronchodilator response and signs of air trapping.   Past Medical History:  Diagnosis Date  . Arthritis   . Asthma   . 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 Bipolar disorder ([facility] [place]   . Colostomy in place ([facility]   . Crohn's colitis ([place]    s/p total colectomy with ileostomy in 2009  . Crohn's disease of small intestine ([facility] Sept 2012   ileoscopy: multiple ulcers likely secondary to Crohn's  . DEEP VENOUS THROMBOPHLEBITIS, HX OF 02/11/2009   Qualifier: Diagnosis of  By: SZeb Comfort   . Depression   . DVT (deep venous thrombosis) ([facility] 2009   right upper extremity due to PICC  . Enterococcus UTI 2009  . GERD (gastroesophageal reflux disease)   . Hernia   . Hyperlipidemia   . Insomnia   . Migraine headache   . Nystagmus   . PULMONARY EMBOLISM, HX OF 02/11/2009   Qualifier: Diagnosis of  By: SZeb Comfort   . S/P endoscopy Sept 2012   mild gastritis, small hiatal hernia, no ulcers  . [place]ia ([place]   . Sch[place]ia, acute ([place]      Family History  Problem Relation Age of Onset  . Diabetes Mother   . Heart  disease Mother   . Diabetes Father   . Heart disease Father   . COPD Maternal Grandmother   . Colon cancer Neg Hx      Social History   Socioeconomic History  . Marital status: Single    Spouse name: Not on file  . Number of children: Not on file  . Years of education: Not on file  . Highest education Lane: Not on file  Occupational History  . Not on file  Tobacco Use  . Smoking status: Current Every Day Smoker    Packs/day: 4.00    Years: 37.00    Pack years: 148.00    Types: Cigarettes  . Smokeless tobacco: Never Used  . Tobacco comment: down to 4ppd 11/14/20  Vaping Use  . Vaping Use: Never used  Substance and Sexual Activity  . Alcohol use: No  . Drug use: No  . Sexual activity: Never  Other Topics Concern  . Not on file  Social History Narrative   Girlfriend of six years, Rectal Cancer on Hospice (05/2011).   Social Determinants of Health   Financial Resource Strain: Not on file  Food Insecurity: Not on file  Transportation Needs: No Transportation Needs  . Lack of Transportation (Medical): No  . Lack of Transportation (Non-Medical): No  Physical Activity:  Not on file  Stress: Not on file  Social Connections: Not on file  Intimate Partner Violence: Not At Risk  . Fear of Current or Ex-Partner: No  . Emotionally Abused: No  . Physically Abused: No  . Sexually Abused: No     Allergies  Allergen Reactions  . Other Shortness Of Breath    Other reaction(s): Shortness Of Breath Throat swells Throat swells  . Aspirin     REACTION: unknown reaction  . Bactrim [Sulfamethoxazole-Trimethoprim]     ABD CRAMPS AND DIARRHEA  . Codeine     REACTION: unknown reaction  . Ibuprofen     REACTION: unknown reaction  . Oxycodone-Acetaminophen Other (See Comments)    Other reaction(s): Swelling  . Penicillins     REACTION: unknown reaction  . Remicade [Infliximab]     COULDN'T BREATHE  . Tramadol Nausea Only    Other reaction(s): Nausea  . Tramadol Hcl      REACTION: unknown reaction  . Vicodin [Hydrocodone-Acetaminophen] Nausea Only     Outpatient Medications Prior to Visit  Medication Sig Dispense Refill  . ACCU-CHEK AVIVA PLUS test strip SMARTSIG:Via Meter    . acetaminophen (TYLENOL) 500 MG tablet Take 1,000 mg by mouth every 6 (six) hours as needed. For pain    . albuterol (PROVENTIL) (2.5 MG/3ML) 0.083% nebulizer solution Take 2.5 mg by nebulization every 6 (six) hours as needed for wheezing or shortness of breath.    Marland Kitchen albuterol (VENTOLIN HFA) 108 (90 Base) MCG/ACT inhaler Inhale 2 puffs into the lungs every 6 (six) hours as needed for wheezing or shortness of breath.    Marland Kitchen atorvastatin (LIPITOR) 80 MG tablet Take 80 mg by mouth daily.    Marland Kitchen azelastine (ASTELIN) 0.1 % nasal spray Place 2 sprays into both nostrils 2 (two) times daily.    . baclofen (LIORESAL) 10 MG tablet 1 tablet    . benztropine (COGENTIN) 0.5 MG tablet Take 0.5 mg by mouth daily.     Marland Kitchen CALCIUM 600/VITAMIN D3 600-800 MG-UNIT TABS TAKE (1) TABLET BY MOUTH THREE TIMES DAILY AFTER MEALS. 84 tablet 5  . clindamycin (CLEOCIN) 300 MG capsule 2 capsules    . Cyanocobalamin (B-12) 1000 MCG CAPS Take by mouth daily.    Marland Kitchen dicyclomine (BENTYL) 10 MG capsule TAKE 1 OR 2 CAPSULE BY MOUTH AS DIRECTED BEFORE MEALS AND AT BEDTIME. (Patient taking differently: As needed) 60 capsule 3  . divalproex (DEPAKOTE ER) 250 MG 24 hr tablet Take 250 mg by mouth daily.     . divalproex (DEPAKOTE) 250 MG DR tablet Take by mouth at bedtime.    . divalproex (DEPAKOTE) 500 MG 24 hr tablet Take 1,000 mg by mouth every evening.    . divalproex (DEPAKOTE) 500 MG DR tablet Take 500 mg by mouth 2 (two) times daily.    Marland Kitchen docusate sodium (COLACE) 100 MG capsule Take 1 capsule (100 mg total) by mouth 2 (two) times daily. (Patient taking differently: Take 100 mg by mouth 2 (two) times daily as needed.) 10 capsule 0  . ELIQUIS 5 MG TABS tablet Take 5 mg by mouth 2 (two) times daily.    . folic acid (FOLVITE) 1 MG  tablet Take 1 mg by mouth daily.    . furosemide (LASIX) 40 MG tablet Take 40 mg by mouth daily.    Marland Kitchen gabapentin (NEURONTIN) 300 MG capsule Take 900 mg by mouth 3 (three) times daily.    Marland Kitchen lidocaine (XYLOCAINE) 2 % solution 2 TSP  PO Q4-6H PRN FOR HEARTBURN OR UPPER ABDOMINAL PAIN 300 mL 11  . meloxicam (MOBIC) 7.5 MG tablet Take 7.5 mg by mouth daily.    . metFORMIN (GLUCOPHAGE) 500 MG tablet Take by mouth 2 (two) times daily with a meal.    . metFORMIN (GLUCOPHAGE-XR) 500 MG 24 hr tablet Take 1,000 mg by mouth 2 (two) times daily.    . mirtazapine (REMERON) 15 MG tablet Take 15 mg by mouth at bedtime.     . Misc. Devices (WALKER WHEELS) MISC USE AS DIRECTED 1 each 0  . Ostomy Supplies (ACTIVE LIFE 1-PC DRAIN [phone]) Pouch MISC     . pantoprazole (PROTONIX) 40 MG tablet TAKE (1) TABLET BY MOUTH TWICE A DAY BEFORE MEALS. (BREAKFAST AND SUPPER) 56 tablet 11  . potassium chloride SA (K-DUR,KLOR-CON) 20 MEQ tablet Take 20 mEq by mouth daily.     . risperiDONE (RISPERDAL) 1 MG tablet Take 1 tablet by mouth at bedtime.    . risperiDONE (RISPERDAL) 2 MG tablet Take 2 mg by mouth at bedtime.     . sertraline (ZOLOFT) 50 MG tablet Take 50 mg by mouth daily.    . tamsulosin (FLOMAX) 0.4 MG CAPS capsule Take 0.4 mg by mouth daily.    . traZODone (DESYREL) 50 MG tablet Take 50 mg by mouth at bedtime.    . fluticasone (FLOVENT HFA) 110 MCG/ACT inhaler Inhale 2 puffs into the lungs 2 (two) times daily. 1 each 12  . mercaptopurine (PURINETHOL) 50 MG tablet TAKE 2 TABLETS DAILY ON EMPTY STOMACH 1 HOUR BEFORE OR 2 HOURS AFTER MEAL 60 tablet 11  . sulfamethoxazole-trimethoprim (BACTRIM DS) 800-160 MG tablet Take 1 tablet by mouth 2 (two) times daily. 14 tablet 0  . metoprolol tartrate (LOPRESSOR) 50 MG tablet Take 1 tablet (50 mg total) by mouth once for 1 dose. PLEASE TAKE 64m (1 TABLET) 1 HOUR PRIOR TO CTA SCAN 1 tablet 0   No facility-administered medications prior to visit.    Review of Systems   Constitutional: Negative for chills, fever, malaise/fatigue and weight loss.  HENT: Negative for congestion, sinus pain and sore throat.   Eyes: Negative.   Respiratory: Positive for cough, shortness of breath and wheezing. Negative for hemoptysis and sputum production.   Cardiovascular: Negative for chest pain, palpitations, orthopnea, claudication and leg swelling.  Gastrointestinal: Negative for abdominal pain, heartburn, nausea and vomiting.  Genitourinary: Negative.   Musculoskeletal: Negative for joint pain and myalgias.  Skin: Negative for rash.  Neurological: Negative for weakness.  Endo/Heme/Allergies: Negative.   Psychiatric/Behavioral: Negative.     Objective:   Vitals:   01/09/21 1202  BP: 114/78  Pulse: 74  Temp: (!) 97.4 F (36.3 C)  TempSrc: Temporal  SpO2: 96%  Weight: 195 lb (88.5 kg)  Height: 5' 6"  (1.676 m)     Physical Exam Constitutional:      General: He is not in acute distress. HENT:     Head: Normocephalic and atraumatic.  Eyes:     Extraocular Movements: Extraocular movements intact.     Conjunctiva/sclera: Conjunctivae normal.     Pupils: Pupils are equal, round, and reactive to light.  Cardiovascular:     Rate and Rhythm: Normal rate and regular rhythm.     Pulses: Normal pulses.     Heart sounds: Normal heart sounds. No murmur heard.   Pulmonary:     Effort: Pulmonary effort is normal.     Breath sounds: Wheezing present. No rhonchi or rales.  Abdominal:  General: Bowel sounds are normal.     Palpations: Abdomen is soft.  Musculoskeletal:     Right lower leg: No edema.     Left lower leg: No edema.  Lymphadenopathy:     Cervical: No cervical adenopathy.  Skin:    General: Skin is warm and dry.  Neurological:     General: No focal deficit present.     Mental Status: He is alert.  Psychiatric:        Mood and Affect: Mood normal.        Behavior: Behavior normal.        Thought Content: Thought content normal.         Judgment: Judgment normal.    CBC    Component Value Date/Time   WBC 3.5 (L) 10/10/2020 0822   RBC 3.92 (L) 10/10/2020 0822   HGB 14.4 10/10/2020 0822   HCT 40.9 10/10/2020 0822   HCT 42 05/24/2011 1417   PLT 141 10/10/2020 0822   MCV 104.3 (H) 10/10/2020 0822   MCH 36.7 (H) 10/10/2020 0822   MCHC 35.2 10/10/2020 0822   RDW 15.1 (H) 10/10/2020 0822   LYMPHSABS 1,061 10/10/2020 0822   MONOABS 0.4 02/14/2018 0458   EOSABS 140 10/10/2020 0822   BASOSABS 11 10/10/2020 0822   BMP Latest Ref Rng & Units 10/10/2020 07/31/2020 12/04/2019  Glucose 65 - 139 mg/dL 127 100(H) 100(H)  BUN 7 - 25 mg/dL 11 7 8   Creatinine 0.70 - 1.33 mg/dL 0.75 0.77 0.72  BUN/Creat Ratio 6 - 22 (calc) NOT APPLICABLE 9 -  Sodium 409 - 146 mmol/L 139 141 143  Potassium 3.5 - 5.3 mmol/L 4.9 5.0 4.7  Chloride 98 - 110 mmol/L 99 103 106  CO2 20 - 32 mmol/L 33(H) 24 28  Calcium 8.6 - 10.3 mg/dL 10.1 9.5 8.8(L)   Chest imaging: CT Coronary 08/07/20 Calcifications in the superior vena cava 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 Nonocclusive filling defect in the distal right lower lobe pulmonary artery branch extending into a segmental sized branch. Within the visualized portions of the thorax there are no suspicious appearing pulmonary nodules or masses, there is no acute consolidative airspace disease, no pleural effusions, no pneumothorax and no lymphadenopathy.  PFT: PFT Results Latest Ref Rng & Units 01/09/2021  FVC-Pre L 2.94  FVC-Predicted Pre % 68  FVC-Post L 3.42  FVC-Predicted Post % 80  Pre FEV1/FVC % % 59  Post FEV1/FCV % % 66  FEV1-Pre L 1.73  FEV1-Predicted Pre % 52  FEV1-Post L 2.27  DLCO uncorrected ml/min/mmHg 22.68  DLCO UNC% % 90  DLCO corrected ml/min/mmHg 22.68  DLCO COR %Predicted % 90  DLVA Predicted % 85  TLC L 6.84  TLC % Predicted % 110  RV % Predicted % 203  01/09/21: Moderately severe obstruction, signs of gas trapping present with significant bronchodilator response.    Echo 11/13/20: 1. Left ventricular ejection fraction, by estimation, is 55 to 60%. The  left ventricle has normal function. Left ventricular endocardial border  not optimally defined to evaluate regional wall motion. Left ventricular  diastolic parameters were normal.  2. Left atrial size was mildly dilated.  3. The mitral valve is normal in structure. Trivial mitral valve  regurgitation.  4. The aortic valve is tricuspid. Mild aortic valve sclerosis is present,  with no evidence of aortic valve stenosis.  5. Tricuspid regurgitation signal is inadequate for assessing PA  pressure.   [place] lower extremity 06/05/20 No evidence of deep venous thrombosis  seen in left lower extremity. Probable superficial thrombophlebitis seen involving the left superficial greater saphenous vein.  Assessment & Plan:   Asthma-COPD overlap syndrome ([place]) - Plan: DISCONTINUED: Fluticasone-Umeclidin-Vilant (TRELEGY ELLIPTA) 100-62.5-25 MCG/INH AEPB  Discussion: [name] is a 55 year old male, daily smoker with history of DVT/PE, crohn's disease s/p colectomy with ileostomy and GERD who returns to pulmonary clinic for shortness of breath.   His clinical history along with pulmonary function test data are consistent with asthma-COPD overlap syndrome.   We will start him on Trelegy Ellipta 1 puff daily. He is to continue as needed albuterol.   We discussed smoking cessation and I stressed this is the single most important thing he can do for his breathing and lung function.  He does not wish to try smoking cessation aids at this time as he tried them in the past. He will continue to cut back on his own.  He is on eliquis for DVT/PE.   He has been referred to our lung cancer screening program.  Follow up in 3 months.  Freda Jackson, MD [facility] Office: [phone]    Current Outpatient Medications:  .  ACCU-CHEK AVIVA PLUS test strip, SMARTSIG:Via Meter, Disp: , Rfl:   .  acetaminophen (TYLENOL) 500 MG tablet, Take 1,000 mg by mouth every 6 (six) hours as needed. For pain, Disp: , Rfl:  .  albuterol (PROVENTIL) (2.5 MG/3ML) 0.083% nebulizer solution, Take 2.5 mg by nebulization every 6 (six) hours as needed for wheezing or shortness of breath., Disp: , Rfl:  .  albuterol (VENTOLIN HFA) 108 (90 Base) MCG/ACT inhaler, Inhale 2 puffs into the lungs every 6 (six) hours as needed for wheezing or shortness of breath., Disp: , Rfl:  .  atorvastatin (LIPITOR) 80 MG tablet, Take 80 mg by mouth daily., Disp: , Rfl:  .  azelastine (ASTELIN) 0.1 % nasal spray, Place 2 sprays into both nostrils 2 (two) times daily., Disp: , Rfl:  .  baclofen (LIORESAL) 10 MG tablet, 1 tablet, Disp: , Rfl:  .  benztropine (COGENTIN) 0.5 MG tablet, Take 0.5 mg by mouth daily. , Disp: , Rfl:  .  CALCIUM 600/VITAMIN D3 600-800 MG-UNIT TABS, TAKE (1) TABLET BY MOUTH THREE TIMES DAILY AFTER MEALS., Disp: 84 tablet, Rfl: 5 .  clindamycin (CLEOCIN) 300 MG capsule, 2 capsules, Disp: , Rfl:  .  Cyanocobalamin (B-12) 1000 MCG CAPS, Take by mouth daily., Disp: , Rfl:  .  dicyclomine (BENTYL) 10 MG capsule, TAKE 1 OR 2 CAPSULE BY MOUTH AS DIRECTED BEFORE MEALS AND AT BEDTIME. (Patient taking differently: As needed), Disp: 60 capsule, Rfl: 3 .  divalproex (DEPAKOTE ER) 250 MG 24 hr tablet, Take 250 mg by mouth daily. , Disp: , Rfl:  .  divalproex (DEPAKOTE) 250 MG DR tablet, Take by mouth at bedtime., Disp: , Rfl:  .  divalproex (DEPAKOTE) 500 MG 24 hr tablet, Take 1,000 mg by mouth every evening., Disp: , Rfl:  .  divalproex (DEPAKOTE) 500 MG DR tablet, Take 500 mg by mouth 2 (two) times daily., Disp: , Rfl:  .  docusate sodium (COLACE) 100 MG capsule, Take 1 capsule (100 mg total) by mouth 2 (two) times daily. (Patient taking differently: Take 100 mg by mouth 2 (two) times daily as needed.), Disp: 10 capsule, Rfl: 0 .  ELIQUIS 5 MG TABS tablet, Take 5 mg by mouth 2 (two) times daily., Disp: , Rfl:  .   folic acid (FOLVITE) 1 MG tablet,  Take 1 mg by mouth daily., Disp: , Rfl:  .  furosemide (LASIX) 40 MG tablet, Take 40 mg by mouth daily., Disp: , Rfl:  .  gabapentin (NEURONTIN) 300 MG capsule, Take 900 mg by mouth 3 (three) times daily., Disp: , Rfl:  .  lidocaine (XYLOCAINE) 2 % solution, 2 TSP  PO Q4-6H PRN FOR HEARTBURN OR UPPER ABDOMINAL PAIN, Disp: 300 mL, Rfl: 11 .  meloxicam (MOBIC) 7.5 MG tablet, Take 7.5 mg by mouth daily., Disp: , Rfl:  .  metFORMIN (GLUCOPHAGE) 500 MG tablet, Take by mouth 2 (two) times daily with a meal., Disp: , Rfl:  .  metFORMIN (GLUCOPHAGE-XR) 500 MG 24 hr tablet, Take 1,000 mg by mouth 2 (two) times daily., Disp: , Rfl:  .  mirtazapine (REMERON) 15 MG tablet, Take 15 mg by mouth at bedtime. , Disp: , Rfl:  .  Misc. Devices (WALKER WHEELS) MISC, USE AS DIRECTED, Disp: 1 each, Rfl: 0 .  Ostomy Supplies (ACTIVE LIFE 1-PC DRAIN [phone]) Pouch MISC, , Disp: , Rfl:  .  pantoprazole (PROTONIX) 40 MG tablet, TAKE (1) TABLET BY MOUTH TWICE A DAY BEFORE MEALS. (BREAKFAST AND SUPPER), Disp: 56 tablet, Rfl: 11 .  potassium chloride SA (K-DUR,KLOR-CON) 20 MEQ tablet, Take 20 mEq by mouth daily. , Disp: , Rfl:  .  risperiDONE (RISPERDAL) 1 MG tablet, Take 1 tablet by mouth at bedtime., Disp: , Rfl:  .  risperiDONE (RISPERDAL) 2 MG tablet, Take 2 mg by mouth at bedtime. , Disp: , Rfl:  .  sertraline (ZOLOFT) 50 MG tablet, Take 50 mg by mouth daily., Disp: , Rfl:  .  tamsulosin (FLOMAX) 0.4 MG CAPS capsule, Take 0.4 mg by mouth daily., Disp: , Rfl:  .  traZODone (DESYREL) 50 MG tablet, Take 50 mg by mouth at bedtime., Disp: , Rfl:  .  mercaptopurine (PURINETHOL) 50 MG tablet, TAKE 2 TABLETS DAILY ON EMPTY STOMACH 1 HOUR BEFORE OR 2 HOURS AFTER MEAL, Disp: 60 tablet, Rfl: 5 .  metoprolol succinate (TOPROL-XL) 25 MG 24 hr tablet, Take 1 tablet (25 mg total) by mouth daily. Take with or immediately following a meal., Disp: 30 tablet, Rfl: 3 .  mometasone-formoterol (DULERA) 200-5  MCG/ACT AERO, Inhale 2 puffs into the lungs in the morning and at bedtime., Disp: 13 g, Rfl: 5 .  nitroGLYCERIN (NITROSTAT) 0.4 MG SL tablet, Place 1 tablet (0.4 mg total) under the tongue every 5 (five) minutes as needed for chest pain., Disp: 25 tablet, Rfl: 3 .  Tiotropium Bromide Monohydrate (SPIRIVA RESPIMAT) 2.5 MCG/ACT AERS, Inhale 2 puffs into the lungs daily., Disp: 4 g, Rfl: 5

## 2021-01-09 NOTE — Patient Instructions (Signed)
Start Trelegy Ellipta 1 puff daily -rinse mouth out after each use  Continue to use albuterol inhaler or nebulizer treatments as needed.  Continue to cut down further on smoking.

## 2021-01-12 ENCOUNTER — Encounter: Payer: Self-pay | Admitting: Internal Medicine

## 2021-01-13 ENCOUNTER — Other Ambulatory Visit: Payer: Self-pay

## 2021-01-13 ENCOUNTER — Ambulatory Visit (INDEPENDENT_AMBULATORY_CARE_PROVIDER_SITE_OTHER): Payer: Medicaid Other | Admitting: Cardiology

## 2021-01-13 ENCOUNTER — Telehealth: Payer: Self-pay | Admitting: *Deleted

## 2021-01-13 ENCOUNTER — Encounter: Payer: Self-pay | Admitting: Cardiology

## 2021-01-13 VITALS — BP 102/52 | HR 76 | Ht 66.0 in | Wt 197.0 lb

## 2021-01-13 DIAGNOSIS — I471 Supraventricular tachycardia: Secondary | ICD-10-CM | POA: Diagnosis not present

## 2021-01-13 MED ORDER — METOPROLOL SUCCINATE ER 50 MG PO TB24: 50.0000 mg | ORAL_TABLET | Freq: Every day | ORAL | 3 refills | Status: DC

## 2021-01-13 NOTE — Patient Instructions (Signed)
Medication Instructions:  Your physician has recommended you make the following change in your medication:  1. START Metoprolol Succinate (Toprol) 50 mg once daily  *If you need a refill on your cardiac medications before your next appointment, please call your pharmacy*   Lab Work: None ordered   Testing/Procedures: None ordered   Follow-Up: At [facility], you and your health needs are our priority.  As part of our continuing mission to provide you with exceptional heart care, we have created designated Provider Care Teams.  These Care Teams include your primary Cardiologist (physician) and Advanced Practice Providers (APPs -  Physician Assistants and Nurse Practitioners) who all work together to provide you with the care you need, when you need it.  We recommend signing up for the patient portal called "MyChart".  Sign up information is provided on this After Visit Summary.  MyChart is used to connect with patients for Virtual Visits (Telemedicine).  Patients are able to view lab/test results, encounter notes, upcoming appointments, etc.  Non-urgent messages can be sent to your provider as well.   To learn more about what you can do with MyChart, go to [email or web address].    Your next appointment:   3 month(s)  The format for your next appointment:   In Person  Provider:   You will see one of the following Advanced Practice Providers on your designated Care Team:    Chanetta Marshall, NP  Tommye Standard, PA-C  Legrand Como "[address] [place], [place]  Thank you for choosing [facility]!!   Trinidad Curet, RN [phone]   Other Instructions  Metoprolol Extended-Release Tablets What is this medicine? METOPROLOL (me TOE proe lole) is a beta blocker. It decreases the amount of work your heart has to do and helps your heart beat regularly. It treats high blood pressure and/or prevent chest pain (also called angina). It also treats heart failure. This medicine may be used for  other purposes; ask your health care provider or pharmacist if you have questions. COMMON BRAND NAME(S): toprol, Toprol XL What should I tell my health care provider before I take this medicine? They need to know if you have any of these conditions:  diabetes  heart or vessel disease like slow heart rate, worsening heart failure, heart block, sick sinus syndrome or Raynaud's disease  kidney disease  liver disease  lung or breathing disease, like asthma or emphysema  pheochromocytoma  thyroid disease  an unusual or allergic reaction to metoprolol, other beta-blockers, medicines, foods, dyes, or preservatives  pregnant or trying to get pregnant  breast-feeding How should I use this medicine? Take this drug by mouth. Take it as directed on the prescription label at the same time every day. Take it with food. You may cut the tablet in half if it is scored (has a line in the middle of it). This may help you swallow the tablet if the whole tablet is too big. Be sure to take both halves. Do not take just one-half of the tablet. Keep taking it unless your health care provider tells you to stop. Talk to your health care provider about the use of this drug in children. While it may be prescribed for children as young as 6 for selected conditions, precautions do apply. Overdosage: If you think you have taken too much of this medicine contact a poison control center or emergency room at once. NOTE: This medicine is only for you. Do not share this medicine with others. What if I miss a dose?  If you miss a dose, take it as soon as you can. If it is almost time for your next dose, take only that dose. Do not take double or extra doses. What may interact with this medicine? This medicine may interact with the following medications:  certain medicines for blood pressure, heart disease, irregular heart beat  certain medicines for depression, like monoamine oxidase (MAO) inhibitors, fluoxetine, or  paroxetine  clonidine  dobutamine  epinephrine  isoproterenol  reserpine This list may not describe all possible interactions. Give your health care provider a list of all the medicines, herbs, non-prescription drugs, or dietary supplements you use. Also tell them if you smoke, drink alcohol, or use illegal drugs. Some items may interact with your medicine. What should I watch for while using this medicine? Visit your doctor or health care professional for regular check ups. Contact your doctor right away if your symptoms worsen. Check your blood pressure and pulse rate regularly. Ask your health care professional what your blood pressure and pulse rate should be, and when you should contact them. You may get drowsy or dizzy. Do not drive, use machinery, or do anything that needs mental alertness until you know how this medicine affects you. Do not sit or stand up quickly, especially if you are an older patient. This reduces the risk of dizzy or fainting spells. Contact your doctor if these symptoms continue. Alcohol may interfere with the effect of this medicine. Avoid alcoholic drinks. This medicine may increase blood sugar. Ask your healthcare provider if changes in diet or medicines are needed if you have diabetes. What side effects may I notice from receiving this medicine? Side effects that you should report to your doctor or health care professional as soon as possible:  allergic reactions like skin rash, itching or hives  cold or numb hands or feet  depression  difficulty breathing  faint  fever with sore throat  irregular heartbeat, chest pain  rapid weight gain  signs and symptoms of high blood sugar such as being more thirsty or hungry or having to urinate more than normal. You may also feel very tired or have blurry vision.  swollen legs or ankles Side effects that usually do not require medical attention (report to your doctor or health care professional if they  continue or are bothersome):  anxiety or nervousness  change in sex drive or performance  dry skin  headache  nightmares or trouble sleeping  short term memory loss  stomach upset or diarrhea This list may not describe all possible side effects. Call your doctor for medical advice about side effects. You may report side effects to FDA at 1-800-FDA-1088. Where should I keep my medicine? Keep out of the reach of children and pets. Store at room temperature between 20 and 25 degrees C (68 and 77 degrees F). Throw away any unused drug after the expiration date. NOTE: This sheet is a summary. It may not cover all possible information. If you have questions about this medicine, talk to your doctor, pharmacist, or health care provider.  2021 Elsevier/Gold Standard (2019-05-03 18:23:00)

## 2021-01-13 NOTE — Telephone Encounter (Signed)
Received a PA to initiate for the trelegy that was sent in by JD.    The insurance will not cover the trelegy but will cover the following without a PA being done:  anoro combivent dulera flovent HFA spiriva handihaler spiriva respimat stiolto  JD please advise if any of these will work for the pt.  thanks

## 2021-01-13 NOTE — Progress Notes (Signed)
Electrophysiology Office Note   Date:  01/13/2021   ID:  Blayton, Huttner [birth year], MRN [id number]  PCP:  [name], MD  Cardiologist:  Margaretann Loveless Primary Electrophysiologist:  Meir Elwood Meredith Leeds, MD    Chief Complaint: SVT   History of Present Illness: [name] is a 55 y.o. male who is being seen today for the evaluation of SVT, pauses at the request of [name], MD. Presenting today for electrophysiology evaluation.  He has a history of Crohn's disease with total colectomy and ileostomy in 2009, recent diagnosis of lower extremity edema/DVT, PE, hyperlipidemia, legal blindness, schizophrenia.  He presented complaining of shortness of breath and palpitations.  He had an echo performed which showed a normal ejection fraction.  He also wore a cardiac monitor that showed a greater than 3-second pause as well as episodes of SVT.  Today, he denies symptoms of chest pain, shortness of breath, orthopnea, PND, lower extremity edema, claudication, dizziness, presyncope, syncope, bleeding, or neurologic sequela. The patient is tolerating medications without difficulties.  He has palpitations on a daily basis.  Palpitations occur for approximately an hour at a time.  He wore a cardiac monitor that showed 9 SVT runs, the longest of 1 minute and 30 seconds.  He also had a 3.7-second pause, though he states that he was likely asleep at that time.  He has not tried anything to get his palpitations to terminate.  He otherwise feels well without complaint.   Past Medical History:  Diagnosis Date  . Arthritis   . Asthma   . 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 Bipolar disorder ([place])   . Colostomy in place [facility])   . Crohn's colitis ([place])    s/p total colectomy with ileostomy in 2009  . Crohn's disease of small intestine [facility]) Sept 2012   ileoscopy: multiple ulcers likely secondary to Crohn's  . DEEP VENOUS THROMBOPHLEBITIS, HX  OF 02/11/2009   Qualifier: Diagnosis of  By: Zeb Comfort    . Depression   . DVT (deep venous thrombosis) ([place]) 2009   right upper extremity due to PICC  . Enterococcus UTI 2009  . GERD (gastroesophageal reflux disease)   . Hernia   . Hyperlipidemia   . Insomnia   . Migraine headache   . Nystagmus   . PULMONARY EMBOLISM, HX OF 02/11/2009   Qualifier: Diagnosis of  By: Zeb Comfort    . S/P endoscopy Sept 2012   mild gastritis, small hiatal hernia, no ulcers  . Schizophrenia ([place])   . Schizophrenia, acute [facility])    Past Surgical History:  Procedure Laterality Date  . APPENDECTOMY    . ESOPHAGOGASTRODUODENOSCOPY  07/2010   gastritis,  bx neg for H.Pylori  . ESOPHAGOGASTRODUODENOSCOPY N/A 01/08/2014   SLF:NO SOURCE FOR ODYNOPHAGIA IDENTIFIED/Multiple small ulcers in the gastric antrum  . EYE SURGERY    . ILEOSCOPY  06/29/2011   SLF: ulcers,multiple/small HH/mild gastritis  . KIDNEY SURGERY    . LAPAROTOMY N/A 06/05/2013   Procedure: EXPLORATORY LAPAROTOMY;  Surgeon: Donato Heinz, MD;  Location: [facility] ORS;  Service: General;  Laterality: N/A;  . LYSIS OF ADHESION N/A 06/05/2013   Procedure: LYSIS OF ADHESIONS;  Surgeon: Donato Heinz, MD;  Location: [facility] ORS;  Service: General;  Laterality: N/A;  . small bowel capsule study  08/2010   few tiny nonbleeding erosions/ulcers ?secondary to relafen or Crohn's. Entire small bowel not seen.   Marland Kitchen  TOTAL COLECTOMY  2009   with ileostomy at [facility] for refractory disease   . TOTAL HIP ARTHROPLASTY     right, due to avascular necrosis from chronic prednisone  . TOTAL SHOULDER REPLACEMENT     bilateral  . TRANSURETHRAL RESECTION OF PROSTATE N/A 12/03/2019   Procedure: TRANSURETHRAL RESECTION OF THE PROSTATE (TURP);  Surgeon: Cleon Gustin, MD;  Location: [facility] ORS;  Service: Urology;  Laterality: N/A;     Current Outpatient Medications  Medication Sig Dispense Refill  . ACCU-CHEK AVIVA PLUS test strip SMARTSIG:Via Meter    . acetaminophen  (TYLENOL) 500 MG tablet Take 1,000 mg by mouth every 6 (six) hours as needed. For pain    . albuterol (PROVENTIL) (2.5 MG/3ML) 0.083% nebulizer solution Take 2.5 mg by nebulization every 6 (six) hours as needed for wheezing or shortness of breath.    Marland Kitchen albuterol (VENTOLIN HFA) 108 (90 Base) MCG/ACT inhaler Inhale 2 puffs into the lungs every 6 (six) hours as needed for wheezing or shortness of breath.    Marland Kitchen atorvastatin (LIPITOR) 80 MG tablet Take 80 mg by mouth daily.    Marland Kitchen azelastine (ASTELIN) 0.1 % nasal spray Place 2 sprays into both nostrils 2 (two) times daily.    . baclofen (LIORESAL) 10 MG tablet 1 tablet    . benztropine (COGENTIN) 0.5 MG tablet Take 0.5 mg by mouth daily.     Marland Kitchen CALCIUM 600/VITAMIN D3 600-800 MG-UNIT TABS TAKE (1) TABLET BY MOUTH THREE TIMES DAILY AFTER MEALS. 84 tablet 5  . clindamycin (CLEOCIN) 300 MG capsule 2 capsules    . Cyanocobalamin (B-12) 1000 MCG CAPS Take by mouth daily.    Marland Kitchen dicyclomine (BENTYL) 10 MG capsule TAKE 1 OR 2 CAPSULE BY MOUTH AS DIRECTED BEFORE MEALS AND AT BEDTIME. (Patient taking differently: As needed) 60 capsule 3  . divalproex (DEPAKOTE ER) 250 MG 24 hr tablet Take 250 mg by mouth daily.     . divalproex (DEPAKOTE) 250 MG DR tablet Take by mouth at bedtime.    . divalproex (DEPAKOTE) 500 MG 24 hr tablet Take 1,000 mg by mouth every evening.    . divalproex (DEPAKOTE) 500 MG DR tablet Take 500 mg by mouth 2 (two) times daily.    Marland Kitchen docusate sodium (COLACE) 100 MG capsule Take 1 capsule (100 mg total) by mouth 2 (two) times daily. (Patient taking differently: Take 100 mg by mouth 2 (two) times daily as needed.) 10 capsule 0  . ELIQUIS 5 MG TABS tablet Take 5 mg by mouth 2 (two) times daily.    . Fluticasone-Umeclidin-Vilant (TRELEGY ELLIPTA) 100-62.5-25 MCG/INH AEPB Inhale 1 puff into the lungs daily. 28 each 6  . Fluticasone-Umeclidin-Vilant (TRELEGY ELLIPTA) 100-62.5-25 MCG/INH AEPB Inhale 1 puff into the lungs daily. 1 each 0  . folic acid  (FOLVITE) 1 MG tablet Take 1 mg by mouth daily.    . furosemide (LASIX) 40 MG tablet Take 40 mg by mouth daily.    Marland Kitchen gabapentin (NEURONTIN) 300 MG capsule Take 900 mg by mouth 3 (three) times daily.    Marland Kitchen lidocaine (XYLOCAINE) 2 % solution 2 TSP  PO Q4-6H PRN FOR HEARTBURN OR UPPER ABDOMINAL PAIN 300 mL 11  . meloxicam (MOBIC) 7.5 MG tablet Take 7.5 mg by mouth daily.    . mercaptopurine (PURINETHOL) 50 MG tablet TAKE 2 TABLETS DAILY ON EMPTY STOMACH 1 HOUR BEFORE OR 2 HOURS AFTER MEAL 60 tablet 11  . metFORMIN (GLUCOPHAGE) 500 MG tablet Take by mouth 2 (two)  times daily with a meal.    . metFORMIN (GLUCOPHAGE-XR) 500 MG 24 hr tablet Take 1,000 mg by mouth 2 (two) times daily.    . mirtazapine (REMERON) 15 MG tablet Take 15 mg by mouth at bedtime.     . Misc. Devices (WALKER WHEELS) MISC USE AS DIRECTED 1 each 0  . Ostomy Supplies (ACTIVE LIFE 1-PC DRAIN [phone]) Pouch MISC     . pantoprazole (PROTONIX) 40 MG tablet TAKE (1) TABLET BY MOUTH TWICE A DAY BEFORE MEALS. (BREAKFAST AND SUPPER) 56 tablet 11  . potassium chloride SA (K-DUR,KLOR-CON) 20 MEQ tablet Take 20 mEq by mouth daily.     . risperiDONE (RISPERDAL) 1 MG tablet Take 1 tablet by mouth at bedtime.    . risperiDONE (RISPERDAL) 2 MG tablet Take 2 mg by mouth at bedtime.     . sertraline (ZOLOFT) 50 MG tablet Take 50 mg by mouth daily.    . tamsulosin (FLOMAX) 0.4 MG CAPS capsule Take 0.4 mg by mouth daily.    . traZODone (DESYREL) 50 MG tablet Take 50 mg by mouth at bedtime.    . metoprolol tartrate (LOPRESSOR) 50 MG tablet Take 1 tablet (50 mg total) by mouth once for 1 dose. PLEASE TAKE 38m (1 TABLET) 1 HOUR PRIOR TO CTA SCAN 1 tablet 0   No current facility-administered medications for this visit.    Allergies:   Other, Aspirin, Bactrim [sulfamethoxazole-trimethoprim], Codeine, Ibuprofen, Oxycodone-acetaminophen, Penicillins, Remicade [infliximab], Tramadol, Tramadol hcl, and Vicodin [hydrocodone-acetaminophen]   Social History:   The patient  reports that he has been smoking cigarettes. He has a 148.00 pack-year smoking history. He has never used smokeless tobacco. He reports that he does not drink alcohol and does not use drugs.   Family History:  The patient's family history includes COPD in his maternal grandmother; Diabetes in his father and mother; Heart disease in his father and mother.    ROS:  Please see the history of present illness.   Otherwise, review of systems is positive for none.   All other systems are reviewed and negative.    PHYSICAL EXAM: VS:  BP (!) 102/52   Pulse 76   Ht 5' 6"  (1.676 m)   Wt 197 lb (89.4 kg)   BMI 31.80 kg/m  , BMI Body mass index is 31.8 kg/m. GEN: Well nourished, well developed, in no acute distress  HEENT: normal  Neck: no JVD, carotid bruits, or masses Cardiac: RRR; no murmurs, rubs, or gallops,no edema  Respiratory:  clear to auscultation bilaterally, normal work of breathing GI: soft, nontender, nondistended, + BS MS: no deformity or atrophy  Skin: warm and dry Neuro:  Strength and sensation are intact Psych: euthymic mood, full affect  EKG:  EKG is ordered today. Personal review of the ekg ordered shows sinus rhythm, rate 76  Recent Labs: 10/10/2020: ALT 46; BUN 11; Creat 0.75; Hemoglobin 14.4; Platelets 141; Potassium 4.9; Sodium 139    Lipid Panel  No results found for: CHOL, TRIG, HDL, CHOLHDL, VLDL, LDLCALC, LDLDIRECT   Wt Readings from Last 3 Encounters:  01/13/21 197 lb (89.4 kg)  01/09/21 195 lb (88.5 kg)  11/27/20 201 lb 12.8 oz (91.5 kg)      Other studies Reviewed: Additional studies/ records that were reviewed today include: TTE 11/13/20  Review of the above records today demonstrates:  1. Left ventricular ejection fraction, by estimation, is 55 to 60%. The  left ventricle has normal function. Left ventricular endocardial border  not optimally  defined to evaluate regional wall motion. Left ventricular  diastolic parameters were normal.   2. Left atrial size was mildly dilated.  3. The mitral valve is normal in structure. Trivial mitral valve  regurgitation.  4. The aortic valve is tricuspid. Mild aortic valve sclerosis is present,  with no evidence of aortic valve stenosis.  5. Tricuspid regurgitation signal is inadequate for assessing PA  pressure.   Cardiac monitor 12/27/2020 personally reviewed Minimum HR (bpm): 41  Maximum HR (bpm): 176 Supraventricular Ectopy: rare <1% SVT: 9 runs, 19 beats at rate of 141 and 1 min 32 sec at rate of 119 bpm. Ventricular Ectopy: rare <1% NSVT: none Ventricular Tachycardia: none Pauses: one pause lasting 3.7 seconds at 5:25am on 11/06/20 AV block: none Atrial fibrillation: none IMPRESSION: One >3 second pause and SVT noted on monitor, no symptoms recorded.  ASSESSMENT AND PLAN:  1.  SVT: Patient has a narrow complex tachycardia.  All his runs of been short, though he does complain of hour-long symptoms.  I told him to use Valsalva maneuvers to terminate tachycardia.  I Neshia Mckenzie also start him on Toprol-XL 50 mg to see if this Rayvn Rickerson improve his symptoms.  I would like to try and avoid ablation, as he does have quite a few medical problems.  He did have a pause on his cardiac monitor, though this appeared to be nocturnal.  Case discussed with primary cardiology  Current medicines are reviewed at length with the patient today.   The patient does not have concerns regarding his medicines.  The following changes were made today: Start Toprol-XL 50 mg  Labs/ tests ordered today include:  Orders Placed This Encounter  Procedures  . EKG 12-Lead     Disposition:   FU with Finley Dinkel 3 months  Signed, Khyron Garno Meredith Leeds, MD  01/13/2021 12:21 PM     [address] [place] [address] [phone] (office) [phone] (fax)

## 2021-01-13 NOTE — Telephone Encounter (Signed)
Let's have him do a bowel purge (instructions below). ONCE he finishes the purge, start taking Colace stool softener 100 mg once a day. HeIf that doesn't keep bowel regular, can add a dose of MiraLAX a day.  Keep in mind the bowel purge may result in diarrhea. However, once things are cleaner out this should hopefully allow him to keep moving.  For Miralax purge: Take 17 gm (1 capful or 1 packet) of Miralax mixed into water, Gatorade, or Powerade followed by 8 ounces of plain water. Do this once an hour until you have a good bowel movement, but no more than 5 doses.

## 2021-01-13 NOTE — Addendum Note (Signed)
Addended by: Stanton Kidney on: 01/13/2021 12:35 PM   Modules accepted: Orders

## 2021-01-14 NOTE — Telephone Encounter (Signed)
Phoned and LM for the pt to return call

## 2021-01-16 ENCOUNTER — Other Ambulatory Visit: Payer: Self-pay | Admitting: Gastroenterology

## 2021-01-16 NOTE — Telephone Encounter (Signed)
Will they cover Breztri? If so, send in prescription for this inhaler. If they don't cover breztri then send in prescription for dulera 200-5 2 puffs twice daily and spiriva respimat 2.56mg 2 puffs twice daily.   Thanks, JWille Glaser

## 2021-01-16 NOTE — Telephone Encounter (Signed)
Phoned the pt and asked him was he still bothered with constipation and he replied no that it had cleared up. So I went ahead and advised him of Eric's instructions for a Miralax purge and once that is done to start taking Colace stool softener 100 mg once a day, and if that doesn't keep his bowel regular he can add a dose of Miralax a day. (Advised the pt that the purge may result in diarrhea , but the cleaner he gets cleaned out this should help him to keep moving. Strongly advised no more than 5 doses a day of the purge. Pt agreed.

## 2021-01-19 ENCOUNTER — Telehealth: Payer: Self-pay | Admitting: Internal Medicine

## 2021-01-19 MED ORDER — DULERA 200-5 MCG/ACT IN AERO: 2.0000 | INHALATION_SPRAY | Freq: Two times a day (BID) | RESPIRATORY_TRACT | 5 refills | Status: DC

## 2021-01-19 MED ORDER — SPIRIVA RESPIMAT 2.5 MCG/ACT IN AERS: 2.0000 | INHALATION_SPRAY | Freq: Every day | RESPIRATORY_TRACT | 5 refills | Status: DC

## 2021-01-19 NOTE — Telephone Encounter (Signed)
Rx for both Dulera 200 and Spiriva 2.5 have been sent to pharmacy for pt. Trelegy has been removed off of pt's medication list due to change in therapy with Trelegy not being covered. Nothing further needed.

## 2021-01-19 NOTE — Telephone Encounter (Signed)
Breztri also requires a PA to be done. Since pt has not tried any of the preferred inhalers other than Flovent HFA, the PA would be denied for [facility] or Trelegy.   Called and spoke with pt letting him know the info stated by Dr. Erin Fulling and let him know due to Trelegy not being covered, we are going to switch him to [facility] and Spiriva Respimat and he verbalized understanding.  Dr. Erin Fulling, please advise with the instructions of the Spiriva that you are wanting it to be twice daily instead of usual instructions of once daily.

## 2021-01-19 NOTE — Addendum Note (Signed)
Addended by: Lorretta Harp on: 01/19/2021 12:56 PM   Modules accepted: Orders

## 2021-01-19 NOTE — Telephone Encounter (Signed)
PATIENT CALLED AND SAID THAT [facility] IS WAITING ON HIS REFILL REQUEST TO BE SENT BACK TO THEM SO THEY CAN FILL HIS PRESCRIPTION

## 2021-01-19 NOTE — Telephone Encounter (Signed)
[name], Were you done signing this pt's chart note to pharmacy because the pt states they need his request sent back to them in order to refill his Rx

## 2021-01-19 NOTE — Telephone Encounter (Signed)
Request for RX received Friday and we were closed for Holiday. Sent refill now.

## 2021-01-19 NOTE — Telephone Encounter (Signed)
The spiriva should be 2.27mg 2 puffs daily. I wrote the instructions incorrectly previously.  Thanks, JWille Glaser

## 2021-01-21 ENCOUNTER — Other Ambulatory Visit: Payer: Self-pay | Admitting: *Deleted

## 2021-01-21 DIAGNOSIS — Z87891 Personal history of nicotine dependence: Secondary | ICD-10-CM

## 2021-01-21 DIAGNOSIS — F17210 Nicotine dependence, cigarettes, uncomplicated: Secondary | ICD-10-CM

## 2021-01-22 NOTE — Telephone Encounter (Signed)
Phoned and advised the pt a Rx had been phoned in for him.

## 2021-01-26 ENCOUNTER — Ambulatory Visit (INDEPENDENT_AMBULATORY_CARE_PROVIDER_SITE_OTHER): Payer: Medicaid Other | Admitting: Internal Medicine

## 2021-01-26 ENCOUNTER — Other Ambulatory Visit: Payer: Self-pay

## 2021-01-26 ENCOUNTER — Encounter: Payer: Self-pay | Admitting: Internal Medicine

## 2021-01-26 VITALS — BP 124/65 | HR 59 | Ht 66.0 in | Wt 196.0 lb

## 2021-01-26 DIAGNOSIS — I471 Supraventricular tachycardia, unspecified: Secondary | ICD-10-CM

## 2021-01-26 DIAGNOSIS — Z79899 Other long term (current) drug therapy: Secondary | ICD-10-CM

## 2021-01-26 DIAGNOSIS — R002 Palpitations: Secondary | ICD-10-CM

## 2021-01-26 DIAGNOSIS — R0609 Other forms of dyspnea: Secondary | ICD-10-CM

## 2021-01-26 DIAGNOSIS — R072 Precordial pain: Secondary | ICD-10-CM

## 2021-01-26 DIAGNOSIS — E785 Hyperlipidemia, unspecified: Secondary | ICD-10-CM

## 2021-01-26 DIAGNOSIS — R0600 Dyspnea, unspecified: Secondary | ICD-10-CM

## 2021-01-26 DIAGNOSIS — I82409 Acute embolism and thrombosis of unspecified deep veins of unspecified lower extremity: Secondary | ICD-10-CM

## 2021-01-26 DIAGNOSIS — Z87891 Personal history of nicotine dependence: Secondary | ICD-10-CM

## 2021-01-26 MED ORDER — METOPROLOL SUCCINATE ER 25 MG PO TB24: 25.0000 mg | ORAL_TABLET | Freq: Every day | ORAL | 3 refills | Status: DC

## 2021-01-26 MED ORDER — NITROGLYCERIN 0.4 MG SL SUBL: 0.4000 mg | SUBLINGUAL_TABLET | SUBLINGUAL | 3 refills | Status: AC | PRN

## 2021-01-26 NOTE — Progress Notes (Signed)
Cardiology Office Note:    Date:  01/26/2021   ID:  [name], DOB 15-Mar-1966, MRN [id number]  PCP:  [name], MD  Cardiologist:  No primary care provider on file.  Electrophysiologist:  None   Referring MD: [name], MD   Chief Complaint/Reason for Referral: DOE  History of Present Illness:    [name] is a 55 y.o. male with a history of Crohn's disease with total colectomy and ileostomy in 2009, recent diagnosis of left lower extremity DVT historical documentation of pulmonary embolism, hyperlipidemia, legal blindness, schizophrenia who presents today forfollow up.  Today, he is feeling constantly fatigued and sleeping all the time. He is now on metoprolol 50 mg, and he believes his fatigue has worsened since then. He was previously on this medication, but it was discontinued due to his blood pressure dropping.  He agrees that he is deconditioned. We discussed gradually increasing his physical activity and formal exercise. Currently, he walks about a quarter mile every day around his neighborhood He has also had success in decreasing his smoking, down to 7 cigarettes a day.  Of note, he is also still having some intermittent chest pains that are not relieved aside from relaxing. He is on two inhalers for COPD and asthma. The inhalers are working well for his breathing but do not relieve the chest pain.  He sometimes experiences exertional chest pain when he walks. We discussed use of nitroglycerin PRN   The LLE remains symptomatic s/p DVT. He continues to be compliant with his blood thinner.  Past Medical History:  Diagnosis Date  . Arthritis   . Asthma   . 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 Bipolar disorder ([place])   . Colostomy in place [facility])   . Crohn's colitis ([place])    s/p total colectomy with ileostomy in 2009  . Crohn's disease of small intestine [facility]) Sept 2012   ileoscopy: multiple ulcers  likely secondary to Crohn's  . DEEP VENOUS THROMBOPHLEBITIS, HX OF 02/11/2009   Qualifier: Diagnosis of  By: Zeb Comfort    . Depression   . DVT (deep venous thrombosis) ([place]) 2009   right upper extremity due to PICC  . Enterococcus UTI 2009  . GERD (gastroesophageal reflux disease)   . Hernia   . Hyperlipidemia   . Insomnia   . Migraine headache   . Nystagmus   . PULMONARY EMBOLISM, HX OF 02/11/2009   Qualifier: Diagnosis of  By: Zeb Comfort    . S/P endoscopy Sept 2012   mild gastritis, small hiatal hernia, no ulcers  . Schizophrenia ([place])   . Schizophrenia, acute [facility])     Past Surgical History:  Procedure Laterality Date  . APPENDECTOMY    . ESOPHAGOGASTRODUODENOSCOPY  07/2010   gastritis,  bx neg for H.Pylori  . ESOPHAGOGASTRODUODENOSCOPY N/A 01/08/2014   SLF:NO SOURCE FOR ODYNOPHAGIA IDENTIFIED/Multiple small ulcers in the gastric antrum  . EYE SURGERY    . ILEOSCOPY  06/29/2011   SLF: ulcers,multiple/small HH/mild gastritis  . KIDNEY SURGERY    . LAPAROTOMY N/A 06/05/2013   Procedure: EXPLORATORY LAPAROTOMY;  Surgeon: Donato Heinz, MD;  Location: [facility] ORS;  Service: General;  Laterality: N/A;  . LYSIS OF ADHESION N/A 06/05/2013   Procedure: LYSIS OF ADHESIONS;  Surgeon: Donato Heinz, MD;  Location: [facility] ORS;  Service: General;  Laterality: N/A;  . small bowel capsule study  08/2010   few tiny  nonbleeding erosions/ulcers ?secondary to relafen or Crohn's. Entire small bowel not seen.   Marland Kitchen TOTAL COLECTOMY  2009   with ileostomy at [facility] for refractory disease   . TOTAL HIP ARTHROPLASTY     right, due to avascular necrosis from chronic prednisone  . TOTAL SHOULDER REPLACEMENT     bilateral  . TRANSURETHRAL RESECTION OF PROSTATE N/A 12/03/2019   Procedure: TRANSURETHRAL RESECTION OF THE PROSTATE (TURP);  Surgeon: Cleon Gustin, MD;  Location: [facility] ORS;  Service: Urology;  Laterality: N/A;    Current Medications: Current Meds  Medication Sig  . ACCU-CHEK AVIVA  PLUS test strip SMARTSIG:Via Meter  . acetaminophen (TYLENOL) 500 MG tablet Take 1,000 mg by mouth every 6 (six) hours as needed. For pain  . albuterol (PROVENTIL) (2.5 MG/3ML) 0.083% nebulizer solution Take 2.5 mg by nebulization every 6 (six) hours as needed for wheezing or shortness of breath.  Marland Kitchen albuterol (VENTOLIN HFA) 108 (90 Base) MCG/ACT inhaler Inhale 2 puffs into the lungs every 6 (six) hours as needed for wheezing or shortness of breath.  Marland Kitchen atorvastatin (LIPITOR) 80 MG tablet Take 80 mg by mouth daily.  Marland Kitchen azelastine (ASTELIN) 0.1 % nasal spray Place 2 sprays into both nostrils 2 (two) times daily.  . baclofen (LIORESAL) 10 MG tablet 1 tablet  . benztropine (COGENTIN) 0.5 MG tablet Take 0.5 mg by mouth daily.   Marland Kitchen CALCIUM 600/VITAMIN D3 600-800 MG-UNIT TABS TAKE (1) TABLET BY MOUTH THREE TIMES DAILY AFTER MEALS.  . clindamycin (CLEOCIN) 300 MG capsule 2 capsules  . Cyanocobalamin (B-12) 1000 MCG CAPS Take by mouth daily.  Marland Kitchen dicyclomine (BENTYL) 10 MG capsule TAKE 1 OR 2 CAPSULE BY MOUTH AS DIRECTED BEFORE MEALS AND AT BEDTIME. (Patient taking differently: As needed)  . divalproex (DEPAKOTE ER) 250 MG 24 hr tablet Take 250 mg by mouth daily.   . divalproex (DEPAKOTE) 250 MG DR tablet Take by mouth at bedtime.  . divalproex (DEPAKOTE) 500 MG 24 hr tablet Take 1,000 mg by mouth every evening.  . divalproex (DEPAKOTE) 500 MG DR tablet Take 500 mg by mouth 2 (two) times daily.  Marland Kitchen docusate sodium (COLACE) 100 MG capsule Take 1 capsule (100 mg total) by mouth 2 (two) times daily. (Patient taking differently: Take 100 mg by mouth 2 (two) times daily as needed.)  . ELIQUIS 5 MG TABS tablet Take 5 mg by mouth 2 (two) times daily.  . folic acid (FOLVITE) 1 MG tablet Take 1 mg by mouth daily.  . furosemide (LASIX) 40 MG tablet Take 40 mg by mouth daily.  Marland Kitchen gabapentin (NEURONTIN) 300 MG capsule Take 900 mg by mouth 3 (three) times daily.  Marland Kitchen lidocaine (XYLOCAINE) 2 % solution 2 TSP  PO Q4-6H PRN FOR  HEARTBURN OR UPPER ABDOMINAL PAIN  . meloxicam (MOBIC) 7.5 MG tablet Take 7.5 mg by mouth daily.  . mercaptopurine (PURINETHOL) 50 MG tablet TAKE 2 TABLETS DAILY ON EMPTY STOMACH 1 HOUR BEFORE OR 2 HOURS AFTER MEAL  . metFORMIN (GLUCOPHAGE) 500 MG tablet Take by mouth 2 (two) times daily with a meal.  . metFORMIN (GLUCOPHAGE-XR) 500 MG 24 hr tablet Take 1,000 mg by mouth 2 (two) times daily.  . mirtazapine (REMERON) 15 MG tablet Take 15 mg by mouth at bedtime.   . Misc. Devices (WALKER WHEELS) MISC USE AS DIRECTED  . mometasone-formoterol (DULERA) 200-5 MCG/ACT AERO Inhale 2 puffs into the lungs in the morning and at bedtime.  . nitroGLYCERIN (NITROSTAT) 0.4 MG SL tablet Place  1 tablet (0.4 mg total) under the tongue every 5 (five) minutes as needed for chest pain.  . Ostomy Supplies (ACTIVE LIFE 1-PC DRAIN [phone]) Pouch MISC   . pantoprazole (PROTONIX) 40 MG tablet TAKE (1) TABLET BY MOUTH TWICE A DAY BEFORE MEALS. (BREAKFAST AND SUPPER)  . potassium chloride SA (K-DUR,KLOR-CON) 20 MEQ tablet Take 20 mEq by mouth daily.   . risperiDONE (RISPERDAL) 1 MG tablet Take 1 tablet by mouth at bedtime.  . risperiDONE (RISPERDAL) 2 MG tablet Take 2 mg by mouth at bedtime.   . sertraline (ZOLOFT) 50 MG tablet Take 50 mg by mouth daily.  . tamsulosin (FLOMAX) 0.4 MG CAPS capsule Take 0.4 mg by mouth daily.  . Tiotropium Bromide Monohydrate (SPIRIVA RESPIMAT) 2.5 MCG/ACT AERS Inhale 2 puffs into the lungs daily.  . traZODone (DESYREL) 50 MG tablet Take 50 mg by mouth at bedtime.  . [DISCONTINUED] metoprolol succinate (TOPROL-XL) 50 MG 24 hr tablet Take 1 tablet (50 mg total) by mouth daily. Take with or immediately following a meal.     Allergies:   Other, Aspirin, Bactrim [sulfamethoxazole-trimethoprim], Codeine, Ibuprofen, Oxycodone-acetaminophen, Penicillins, Remicade [infliximab], Tramadol, Tramadol hcl, and Vicodin [hydrocodone-acetaminophen]   Social History   Tobacco Use  . Smoking status:  Current Every Day Smoker    Packs/day: 4.00    Years: 37.00    Pack years: 148.00    Types: Cigarettes  . Smokeless tobacco: Never Used  . Tobacco comment: down to 4ppd 11/14/20  Vaping Use  . Vaping Use: Never used  Substance Use Topics  . Alcohol use: No  . Drug use: No     Family History: The patient's family history includes COPD in his maternal grandmother; Diabetes in his father and mother; Heart disease in his father and mother. There is no history of Colon cancer.  ROS:   Please see the history of present illness.    All other systems reviewed and are negative.  EKGs/Labs/Other Studies Reviewed:    The following studies were reviewed today:  EKG:   10/17/2020: NSR 01/26/2021: NSR  Recent Labs: 10/10/2020: ALT 46; BUN 11; Creat 0.75; Hemoglobin 14.4; Platelets 141; Potassium 4.9; Sodium 139  Recent Lipid Panel No results found for: CHOL, TRIG, HDL, CHOLHDL, VLDL, LDLCALC, LDLDIRECT  Physical Exam:    VS:  BP 124/65   Pulse (!) 59   Ht 5' 6"  (1.676 m)   Wt 196 lb (88.9 kg)   SpO2 95%   BMI 31.64 kg/m     Wt Readings from Last 5 Encounters:  01/26/21 196 lb (88.9 kg)  01/13/21 197 lb (89.4 kg)  01/09/21 195 lb (88.5 kg)  11/27/20 201 lb 12.8 oz (91.5 kg)  11/14/20 198 lb 6.4 oz (90 kg)    Constitutional: No acute distress Eyes: sclera non-icteric, normal conjunctiva and lids ENMT: normal dentition, moist mucous membranes Cardiovascular: regular rhythm, normal rate, no murmurs. S1 and S2 normal. Radial pulses normal bilaterally. No jugular venous distention.  Respiratory: clear to auscultation bilaterally GI : normal bowel sounds, soft and nontender. No distention.   MSK: extremities warm, well perfused. Mild LE edema.  NEURO: grossly nonfocal exam, moves all extremities. PSYCH: alert and oriented x 3, normal mood and affect.   ASSESSMENT:    1. SVT (supraventricular tachycardia) (HCC)   2. Palpitations   3. Hyperlipidemia, unspecified hyperlipidemia  type   4. Precordial pain   5. Dyspnea on exertion   6. History of smoking   7. Recurrent deep vein thrombosis (  DVT) ([place])   8. Medication management    PLAN:    SVT (supraventricular tachycardia) (HCC) Palpitations - feels fatigue has worsened on metoprolol succ 50 mg daily, will decrease dose to 25 mg daily.  Hyperlipidemia, unspecified hyperlipidemia type - Plan: EKG 12-Lead - continue atorvastatin  Precordial pain Dyspnea on exertion - mild CAD on CCTA in 08/2020. We will try sublingual nitro for chest pain episodes. If effective can try long acting nitrate. He has historically had some lower blood pressures so may be more challenging to use this.   History of smoking - reducing the number of cigarettes over time.   Recurrent deep vein thrombosis (DVT) (HCC) - remains on AC without bleeding.   Total time of encounter: 30 minutes total time of encounter, including 25 minutes spent in face-to-face patient care on the date of this encounter. This time includes coordination of care and counseling regarding above mentioned problem list. Remainder of non-face-to-face time involved reviewing chart documents/testing relevant to the patient encounter and documentation in the medical record. I have independently reviewed documentation from referring provider.   Cherlynn Kaiser, MD, [facility]    Medication Adjustments/Labs and Tests Ordered: Current medicines are reviewed at length with the patient today.  Concerns regarding medicines are outlined above.   Orders Placed This Encounter  Procedures  . EKG 12-Lead    Meds ordered this encounter  Medications  . metoprolol succinate (TOPROL-XL) 25 MG 24 hr tablet    Sig: Take 1 tablet (25 mg total) by mouth daily. Take with or immediately following a meal.    Dispense:  30 tablet    Refill:  3  . nitroGLYCERIN (NITROSTAT) 0.4 MG SL tablet    Sig: Place 1 tablet (0.4 mg total) under the tongue every 5 (five)  minutes as needed for chest pain.    Dispense:  25 tablet    Refill:  3    Patient Instructions  Medication Instructions:   NITROGLYCERIN FOR CHEST PAIN- In an angina attack (CHEST PAIN), you should feel better within 5 minutes after your first dose. Do not swallow whole. Sit down when taking this medicine. You can take a dose every 5 minutes up to a total of 3 doses. If you do not feel better or feel worse after 1 dose, call 9-1-1 at once. Do not take more than 3 doses in 15 minutes. Do not take your medicine more often than directed.  DECREASE METOPROLOL TO 43m TAKE (1) TABLET DAILY - PLEASE PICK UP NEW PRESCRIPTION FOR THIS AT THE PHARMACY  *If you need a refill on your cardiac medications before your next appointment, please call your pharmacy*  Follow-Up: [facility] you and your health needs are our priority.  As part of our continuing mission to provide you with exceptional heart care, we have created designated Provider Care Teams.  These Care Teams include your primary Cardiologist (physician) and Advanced Practice Providers (APPs -  Physician Assistants and Nurse Practitioners) who all work together to provide you with the care you need, when you need it.  We recommend signing up for the patient portal called "MyChart".  Sign up information is provided on this After Visit Summary.  MyChart is used to connect with patients for Virtual Visits (Telemedicine).  Patients are able to view lab/test results, encounter notes, upcoming appointments, etc.  Non-urgent messages can be sent to your provider as well.   To learn more about what you can do with  MyChart, go to [email or web address].    Your next appointment:   6 month(s)  The format for your next appointment:   In Person  Provider:   Cherlynn Kaiser, MD     [facility] Stumpf,acting as a scribe for Elouise Munroe, MD.,have documented all relevant documentation on the behalf of Elouise Munroe, MD,as directed by   Elouise Munroe, MD while in the presence of Elouise Munroe, MD.  I, Elouise Munroe, MD, have reviewed all documentation for this visit. The documentation on 01/26/21 for the exam, diagnosis, procedures, and orders are all accurate and complete.

## 2021-01-26 NOTE — Patient Instructions (Signed)
Medication Instructions:   NITROGLYCERIN FOR CHEST PAIN- In an angina attack (CHEST PAIN), you should feel better within 5 minutes after your first dose. Do not swallow whole. Sit down when taking this medicine. You can take a dose every 5 minutes up to a total of 3 doses. If you do not feel better or feel worse after 1 dose, call 9-1-1 at once. Do not take more than 3 doses in 15 minutes. Do not take your medicine more often than directed.  DECREASE METOPROLOL TO 68m TAKE (1) TABLET DAILY - PLEASE PICK UP NEW PRESCRIPTION FOR THIS AT THE PHARMACY  *If you need a refill on your cardiac medications before your next appointment, please call your pharmacy*  Follow-Up: [facility] you and your health needs are our priority.  As part of our continuing mission to provide you with exceptional heart care, we have created designated Provider Care Teams.  These Care Teams include your primary Cardiologist (physician) and Advanced Practice Providers (APPs -  Physician Assistants and Nurse Practitioners) who all work together to provide you with the care you need, when you need it.  We recommend signing up for the patient portal called "MyChart".  Sign up information is provided on this After Visit Summary.  MyChart is used to connect with patients for Virtual Visits (Telemedicine).  Patients are able to view lab/test results, encounter notes, upcoming appointments, etc.  Non-urgent messages can be sent to your provider as well.   To learn more about what you can do with MyChart, go to [email or web address]    Your next appointment:   6 month(s)  The format for your next appointment:   In Person  Provider:   GCherlynn Kaiser MD

## 2021-02-04 ENCOUNTER — Encounter: Payer: Self-pay | Admitting: Pulmonary Disease

## 2021-02-10 ENCOUNTER — Ambulatory Visit: Payer: Medicaid Other | Admitting: Internal Medicine

## 2021-03-16 ENCOUNTER — Encounter: Payer: Medicaid Other | Admitting: Acute Care

## 2021-03-16 ENCOUNTER — Telehealth: Payer: Self-pay | Admitting: Acute Care

## 2021-03-16 ENCOUNTER — Ambulatory Visit (HOSPITAL_COMMUNITY)
Admission: RE | Admit: 2021-03-16 | Discharge: 2021-03-16 | Disposition: A | Discharge status: Home / Self Care | Payer: Medicaid Other | Source: Ambulatory Visit | Attending: Acute Care | Admitting: Acute Care

## 2021-03-16 ENCOUNTER — Other Ambulatory Visit: Payer: Self-pay

## 2021-03-16 DIAGNOSIS — F17210 Nicotine dependence, cigarettes, uncomplicated: Secondary | ICD-10-CM | POA: Insufficient documentation

## 2021-03-16 DIAGNOSIS — Z87891 Personal history of nicotine dependence: Secondary | ICD-10-CM | POA: Diagnosis present

## 2021-03-16 NOTE — Telephone Encounter (Signed)
Patient was a no show for his tele visit for shared decision making visit . When we looked in epic it became evident he had been scanned early, without the appropriate shared decision making process. I called [facility] Radiology. I spoke with Micah who scanned the patient. . He states the patient had transportation issues and had to get scanned early. I explained that if he had called [place] before he scanned the patient, we could have done the  [facility] by phone while the patient was in the radiology area. I explained that insurance will not pay for the first scan as there was no SDMV prior to the imaging.  I have asked Micah to call us when patients reschedule their scans so that we can make sure the SDMV is done, per the Medicare/ Insurance Guidelines, as this is a very large amount of money for patient's to pay out of pocket.  This was a change in our screening policy, as patient was imaged without a SDMV. Scan was done early, and there was no communicstion of this change with our office.   Magdalen Spatz, MSN, AGACNP-BC [place] for personal pager PCCM on call pager [phone]  03/16/2021 2:34 PM

## 2021-03-17 ENCOUNTER — Ambulatory Visit: Payer: Medicaid Other | Admitting: Nurse Practitioner

## 2021-03-17 ENCOUNTER — Encounter: Payer: Self-pay | Admitting: *Deleted

## 2021-03-17 ENCOUNTER — Ambulatory Visit (INDEPENDENT_AMBULATORY_CARE_PROVIDER_SITE_OTHER): Payer: Medicaid Other | Admitting: Gastroenterology

## 2021-03-17 ENCOUNTER — Encounter: Payer: Self-pay | Admitting: Gastroenterology

## 2021-03-17 VITALS — BP 104/66 | HR 69 | Temp 96.9°F | Ht 66.0 in | Wt 202.2 lb

## 2021-03-17 DIAGNOSIS — R109 Unspecified abdominal pain: Secondary | ICD-10-CM | POA: Diagnosis not present

## 2021-03-17 DIAGNOSIS — D7589 Other specified diseases of blood and blood-forming organs: Secondary | ICD-10-CM | POA: Diagnosis not present

## 2021-03-17 NOTE — Patient Instructions (Signed)
Please complete blood work.  I have also ordered a CT scan. If you have worsening abdominal pain, nausea, vomiting, or stoma color changes, go to the emergency room.  Further recommendations to follow!  I enjoyed seeing you again today! As you know, I value our relationship and want to provide genuine, compassionate, and quality care. I welcome your feedback. If you receive a survey regarding your visit,  I greatly appreciate you taking time to fill this out. See you next time!  Annitta Needs, PhD, ANP-BC [facility]

## 2021-03-17 NOTE — Progress Notes (Signed)
b   Referring Provider: [name], MD Primary Care Physician:  [name], MD Primary GI: Dr. Abbey Chatters  Chief Complaint  Patient presents with   Crohn's Disease    Has been having pain past few days around stoma    HPI:   [name] is a 55 y.o. male presenting today with a history of Crohn's colitis diagnosed many years ago s/p total colectomy with ileostomy in 2009, maintained on mercaptopurine. Last DEXA in May 2021. History of ileus/SBO in 2019 due to parastomal hernia. Surgery at [facility] 2019.  Passing flatus per stoma, no liquid stool. Few days of pain at site of stoma. Getting ready to start Humira for RA. Dicyclomine several times a day. Colace is just as needed. Meloxicam once a week. Protonix BID. Controls GERD. No rectal bleeding. He is also on methotrexate. DEXA scan last done 2021. Due again in 2023.   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[place])    right, s/p replacement, due to prednisone   Bipolar disorder ([place])    Colostomy in place [facility])    Crohn's colitis ([place])    s/p total colectomy with ileostomy in 2009   Crohn's disease of small intestine [facility]) Sept 2012   ileoscopy: multiple ulcers likely secondary to [place], HX OF 02/11/2009   Qualifier: Diagnosis of  By: Zeb Comfort     Depression    DVT (deep venous thrombosis) ([place]) 2009   right upper extremity due to PICC   Enterococcus UTI 2009   GERD (gastroesophageal reflux disease)    Hernia    Hyperlipidemia    Insomnia    Migraine headache    Nystagmus    PULMONARY EMBOLISM, HX OF 02/11/2009   Qualifier: Diagnosis of  By: Zeb Comfort     S/P endoscopy Sept 2012   mild gastritis, small hiatal hernia, no ulcers   Schizophrenia ([place])    Schizophrenia, acute ([place])     Past Surgical History:  Procedure Laterality Date   APPENDECTOMY     ESOPHAGOGASTRODUODENOSCOPY  07/2010   gastritis,  bx neg for  H.Pylori   ESOPHAGOGASTRODUODENOSCOPY N/A 01/08/2014   SLF:NO SOURCE FOR ODYNOPHAGIA IDENTIFIED/Multiple small ulcers in the gastric antrum   EYE SURGERY     ILEOSCOPY  06/29/2011   SLF: ulcers,multiple/small HH/mild gastritis   KIDNEY SURGERY     LAPAROTOMY N/A 06/05/2013   Procedure: EXPLORATORY LAPAROTOMY;  Surgeon: Donato Heinz, MD;  Location: [facility];  Service: General;  Laterality: N/A;   LYSIS OF ADHESION N/A 06/05/2013   Procedure: LYSIS OF ADHESIONS;  Surgeon: Donato Heinz, MD;  Location: [facility];  Service: General;  Laterality: N/A;   small bowel capsule study  08/2010   few tiny nonbleeding erosions/ulcers ?secondary to relafen or Crohn's. Entire small bowel not seen.    TOTAL COLECTOMY  2009   with ileostomy at [facility] for refractory disease    TOTAL HIP ARTHROPLASTY     right, due to avascular necrosis from chronic prednisone   TOTAL SHOULDER REPLACEMENT     bilateral   TRANSURETHRAL RESECTION OF PROSTATE N/A 12/03/2019   Procedure: TRANSURETHRAL RESECTION OF THE PROSTATE (TURP);  Surgeon: Cleon Gustin, MD;  Location: [facility];  Service: Urology;  Laterality: N/A;    Current Outpatient Medications  Medication Sig Dispense Refill   ACCU-CHEK AVIVA PLUS test strip SMARTSIG:Via Meter  acetaminophen (TYLENOL) 500 MG tablet Take 1,000 mg by mouth every 6 (six) hours as needed. For pain     albuterol (PROVENTIL) (2.5 MG/3ML) 0.083% nebulizer solution Take 2.5 mg by nebulization every 6 (six) hours as needed for wheezing or shortness of breath.     albuterol (VENTOLIN HFA) 108 (90 Base) MCG/ACT inhaler Inhale 2 puffs into the lungs every 6 (six) hours as needed for wheezing or shortness of breath.     atorvastatin (LIPITOR) 80 MG tablet Take 80 mg by mouth daily.     azelastine (ASTELIN) 0.1 % nasal spray Place 2 sprays into both nostrils 2 (two) times daily.     baclofen (LIORESAL) 10 MG tablet Take 10 mg by mouth daily.     benztropine (COGENTIN) 0.5 MG tablet Take 0.5 mg  by mouth daily.      CALCIUM 600/VITAMIN D3 600-800 MG-UNIT TABS TAKE (1) TABLET BY MOUTH THREE TIMES DAILY AFTER MEALS. 84 tablet 5   Cyanocobalamin (B-12) 1000 MCG CAPS Take by mouth daily.     dicyclomine (BENTYL) 10 MG capsule TAKE 1 OR 2 CAPSULE BY MOUTH AS DIRECTED BEFORE MEALS AND AT BEDTIME. (Patient taking differently: As needed) 60 capsule 3   divalproex (DEPAKOTE ER) 250 MG 24 hr tablet Take 250 mg by mouth daily.      divalproex (DEPAKOTE) 250 MG DR tablet Take by mouth at bedtime.     divalproex (DEPAKOTE) 500 MG 24 hr tablet Take 1,000 mg by mouth every evening.     divalproex (DEPAKOTE) 500 MG DR tablet Take 500 mg by mouth 2 (two) times daily.     docusate sodium (COLACE) 100 MG capsule Take 1 capsule (100 mg total) by mouth 2 (two) times daily. (Patient taking differently: Take 100 mg by mouth 2 (two) times daily as needed.) 10 capsule 0   ELIQUIS 5 MG TABS tablet Take 5 mg by mouth 2 (two) times daily.     folic acid (FOLVITE) 1 MG tablet Take 1 mg by mouth daily.     furosemide (LASIX) 40 MG tablet Take 40 mg by mouth daily.     gabapentin (NEURONTIN) 300 MG capsule Take 900 mg by mouth 3 (three) times daily.     meloxicam (MOBIC) 7.5 MG tablet Take 7.5 mg by mouth daily.     mercaptopurine (PURINETHOL) 50 MG tablet TAKE 2 TABLETS DAILY ON EMPTY STOMACH 1 HOUR BEFORE OR 2 HOURS AFTER MEAL 60 tablet 5   metFORMIN (GLUCOPHAGE-XR) 500 MG 24 hr tablet Take 1,000 mg by mouth 2 (two) times daily.     methotrexate 2.5 MG tablet Take 20 mg by mouth once a week.     metoprolol succinate (TOPROL-XL) 25 MG 24 hr tablet Take 1 tablet (25 mg total) by mouth daily. Take with or immediately following a meal. 30 tablet 3   mirtazapine (REMERON) 15 MG tablet Take 15 mg by mouth at bedtime.      Misc. Devices (WALKER WHEELS) MISC USE AS DIRECTED 1 each 0   mometasone-formoterol (DULERA) 200-5 MCG/ACT AERO Inhale 2 puffs into the lungs in the morning and at bedtime. 13 g 5   nitroGLYCERIN  (NITROSTAT) 0.4 MG SL tablet Place 1 tablet (0.4 mg total) under the tongue every 5 (five) minutes as needed for chest pain. 25 tablet 3   Ostomy Supplies (ACTIVE LIFE 1-PC DRAIN [phone]) Pouch MISC      pantoprazole (PROTONIX) 40 MG tablet TAKE (1) TABLET BY MOUTH TWICE A DAY BEFORE  MEALS. (BREAKFAST AND SUPPER) 56 tablet 11   potassium chloride SA (K-DUR,KLOR-CON) 20 MEQ tablet Take 20 mEq by mouth daily.      risperiDONE (RISPERDAL) 1 MG tablet Take 1 tablet by mouth at bedtime.     risperiDONE (RISPERDAL) 2 MG tablet Take 2 mg by mouth at bedtime.      sertraline (ZOLOFT) 50 MG tablet Take 50 mg by mouth daily.     tamsulosin (FLOMAX) 0.4 MG CAPS capsule Take 0.4 mg by mouth daily.     Tiotropium Bromide Monohydrate (SPIRIVA RESPIMAT) 2.5 MCG/ACT AERS Inhale 2 puffs into the lungs daily. 4 g 5   traZODone (DESYREL) 50 MG tablet Take 50 mg by mouth at bedtime.     No current facility-administered medications for this visit.    Allergies as of 03/17/2021 - Review Complete 03/17/2021  Allergen Reaction Noted   Other Shortness Of Breath 04/17/2012   Aspirin     Bactrim [sulfamethoxazole-trimethoprim]  11/07/2013   Codeine     Ibuprofen     Oxycodone-acetaminophen Other (See Comments) 04/17/2012   Penicillins     Remicade [infliximab]  04/18/2013   Tramadol Nausea Only 11/04/2011   Tramadol hcl     Vicodin [hydrocodone-acetaminophen] Nausea Only 05/17/2011    Family History  Problem Relation Age of Onset   Diabetes Mother    Heart disease Mother    Diabetes Father    Heart disease Father    COPD Maternal Grandmother    Colon cancer Neg Hx     Social History   Socioeconomic History   Marital status: Single    Spouse name: Not on file   Number of children: Not on file   Years of education: Not on file   Highest education level: Not on file  Occupational History   Not on file  Tobacco Use   Smoking status: Every Day    Packs/day: 4.00    Years: 37.00    Pack years:  148.00    Types: Cigarettes   Smokeless tobacco: Never   Tobacco comments:    down to 4ppd 11/14/20  Vaping Use   Vaping Use: Never used  Substance and Sexual Activity   Alcohol use: No   Drug use: No   Sexual activity: Never  Other Topics Concern   Not on file  Social History Narrative   Girlfriend of six years, Rectal Cancer on Hospice (05/2011).   Social Determinants of Health   Financial Resource Strain: Not on file  Food Insecurity: Not on file  Transportation Needs: No Transportation Needs   Lack of Transportation (Medical): No   Lack of Transportation (Non-Medical): No  Physical Activity: Not on file  Stress: Not on file  Social Connections: Not on file    Review of Systems: Gen: Denies fever, chills, anorexia. Denies fatigue, weakness, weight loss.  CV: Denies chest pain, palpitations, syncope, peripheral edema, and claudication. Resp: Denies dyspnea at rest, cough, wheezing, coughing up blood, and pleurisy. GI: see HPI Derm: Denies rash, itching, dry skin Psych: Denies depression, anxiety, memory loss, confusion. No homicidal or suicidal ideation.  Heme: Denies bruising, bleeding, and enlarged lymph nodes.  Physical Exam: BP 104/66   Pulse 69   Temp (!) 96.9 F (36.1 C) (Temporal)   Ht 5' 6"  (1.676 m)   Wt 202 lb 3.2 oz (91.7 kg)   BMI 32.64 kg/m  General:   Alert and oriented. No distress noted. Pleasant and cooperative.  Head:  Normocephalic and atraumatic. Eyes:  Conjuctiva clear without scleral icterus. Mouth:  mask in place Abdomen:  +BS, soft, mild TTP around stoma, which is beefy red and some prolapse.   No rebound or guarding. No HSM or masses noted. Msk:  Symmetrical without gross deformities. Normal posture. Extremities:  Without edema. Neurologic:  Alert and  oriented x4 Psych:  Alert and cooperative. Normal mood and affect.     ASSESSMENT: [name] is a 55 y.o. male presenting today with a history of Crohn's colitis diagnosed many  years ago s/p total colectomy with ileostomy in 2009, maintained on mercaptopurine. Last DEXA in May 2021. History of ileus/SBO in 2019 due to parastomal hernia. Surgery at [facility] 2019.  He notes mild pain around his stoma but appears healthy tissue. Possibly prolapsing stoma. Unclear if hernia. Needs CT as he is at risk for this. I do note he also is on methotrexate for RA and will be getting ready to start Humira.   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 He has been on mercaptopurine for many years, on methotrexate (per Rheumatology) and will be starting Humira soon for RA. Will discuss with Dr. Abbey Chatters. Will need continued close monitoring of labs and close follow-up with Rheumatology as he is doing.    PLAN:   CBC, CMP, B12, folate panel today CT abd/pelvis DEXA scan next year Further recommendations to follow   Annitta Needs, PhD, ANP-BC [facility]

## 2021-03-18 ENCOUNTER — Ambulatory Visit (HOSPITAL_COMMUNITY): Payer: Medicaid Other

## 2021-03-19 ENCOUNTER — Ambulatory Visit (HOSPITAL_COMMUNITY)
Admission: RE | Admit: 2021-03-19 | Discharge: 2021-03-19 | Disposition: A | Discharge status: Home / Self Care | Payer: Medicaid Other | Source: Ambulatory Visit | Attending: Gastroenterology | Admitting: Gastroenterology

## 2021-03-19 ENCOUNTER — Other Ambulatory Visit: Payer: Self-pay

## 2021-03-19 DIAGNOSIS — R109 Unspecified abdominal pain: Secondary | ICD-10-CM | POA: Insufficient documentation

## 2021-03-23 ENCOUNTER — Telehealth: Payer: Self-pay | Admitting: Internal Medicine

## 2021-03-23 LAB — HOMOCYSTEINE: Homocysteine: 7.6 umol/L (ref <11.4)

## 2021-03-23 LAB — COMPLETE METABOLIC PANEL WITH GFR
AG Ratio: 1.6 (calc) (ref 1.0–2.5)
ALT: 33 U/L (ref 9–46)
AST: 39 U/L — ABNORMAL HIGH (ref 10–35)
Albumin: 3.6 g/dL (ref 3.6–5.1)
Alkaline phosphatase (APISO): 78 U/L (ref 35–144)
BUN/Creatinine Ratio: 9 (calc) (ref 6–22)
BUN: 6 mg/dL — ABNORMAL LOW (ref 7–25)
CO2: 28 mmol/L (ref 20–32)
Calcium: 8.6 mg/dL (ref 8.6–10.3)
Chloride: 96 mmol/L — ABNORMAL LOW (ref 98–110)
Creat: 0.65 mg/dL — ABNORMAL LOW (ref 0.70–1.33)
GFR, Est African American: 127 mL/min/{1.73_m2} (ref >=60)
GFR, Est Non African American: 110 mL/min/{1.73_m2} (ref >=60)
Globulin: 2.2 g/dL (calc) (ref 1.9–3.7)
Glucose, Bld: 78 mg/dL (ref 65–99)
Potassium: 4.6 mmol/L (ref 3.5–5.3)
Sodium: 131 mmol/L — ABNORMAL LOW (ref 135–146)
Total Bilirubin: 1.3 mg/dL — ABNORMAL HIGH (ref 0.2–1.2)
Total Protein: 5.8 g/dL — ABNORMAL LOW (ref 6.1–8.1)

## 2021-03-23 LAB — CBC WITH DIFFERENTIAL/PLATELET
Absolute Monocytes: 200 cells/uL (ref 200–950)
Basophils Absolute: 10 cells/uL (ref 0–200)
Basophils Relative: 0.4 %
Eosinophils Absolute: 40 cells/uL (ref 15–500)
Eosinophils Relative: 1.6 %
HCT: 33.8 % — ABNORMAL LOW (ref 38.5–50.0)
Hemoglobin: 12.1 g/dL — ABNORMAL LOW (ref 13.2–17.1)
Lymphs Abs: 773 cells/uL — ABNORMAL LOW (ref 850–3900)
MCH: 39.5 pg — ABNORMAL HIGH (ref 27.0–33.0)
MCHC: 35.8 g/dL (ref 32.0–36.0)
MCV: 110.5 fL — ABNORMAL HIGH (ref 80.0–100.0)
MPV: 12.4 fL (ref 7.5–12.5)
Monocytes Relative: 8 %
Neutro Abs: 1478 cells/uL — ABNORMAL LOW (ref 1500–7800)
Neutrophils Relative %: 59.1 %
Platelets: 106 10*3/uL — ABNORMAL LOW (ref 140–400)
RBC: 3.06 10*6/uL — ABNORMAL LOW (ref 4.20–5.80)
RDW: 16.3 % — ABNORMAL HIGH (ref 11.0–15.0)
Total Lymphocyte: 30.9 %
WBC: 2.5 10*3/uL — ABNORMAL LOW (ref 3.8–10.8)

## 2021-03-23 LAB — METHYLMALONIC ACID, SERUM: Methylmalonic Acid, Quant: 100 nmol/L (ref 87–318)

## 2021-03-23 LAB — B12 AND FOLATE PANEL
Folate: 16.2 ng/mL
Vitamin B-12: 509 pg/mL (ref 200–1100)

## 2021-03-23 NOTE — Telephone Encounter (Signed)
Pt called to update his new phone number and asked if his lab and CT results were back yet. Please advise. [phone]

## 2021-03-23 NOTE — Telephone Encounter (Signed)
The pt called and he is wanting results of his labs and CT report.

## 2021-03-24 ENCOUNTER — Telehealth: Payer: Self-pay | Admitting: Internal Medicine

## 2021-03-24 NOTE — Telephone Encounter (Signed)
Already addressed

## 2021-03-24 NOTE — Telephone Encounter (Signed)
Pt called asking if his results were back yet. I told him the nurse was at lunch and she was waiting for the doctor to sign off on results. [phone]

## 2021-03-24 NOTE — Telephone Encounter (Signed)
See result note.  

## 2021-03-24 NOTE — Telephone Encounter (Signed)
Already spoke with the pt regarding his labs and CT report

## 2021-03-26 NOTE — Progress Notes (Signed)
Please call patient and let them  know their  low dose Ct was read as a Lung RADS 2: nodules that are benign in appearance and behavior with a very low likelihood of becoming a clinically active cancer due to size or lack of growth. Recommendation per radiology is for a repeat LDCT in 12 months. .Please let them  know we will order and schedule their  annual screening scan for 03/2022. Please let them  know there was notation of CAD on their  scan.  Please remind the patient  that this is a non-gated exam therefore degree or severity of disease  cannot be determined. Please have them  follow up with their PCP regarding potential risk factor modification, dietary therapy or pharmacologic therapy if clinically indicated. Pt.  is  currently on statin therapy. Please place order for annual  screening scan for  03/2022 and fax results to PCP. Thanks so much. 

## 2021-03-30 ENCOUNTER — Other Ambulatory Visit: Payer: Self-pay | Admitting: *Deleted

## 2021-03-30 DIAGNOSIS — F17210 Nicotine dependence, cigarettes, uncomplicated: Secondary | ICD-10-CM

## 2021-03-30 DIAGNOSIS — Z87891 Personal history of nicotine dependence: Secondary | ICD-10-CM

## 2021-04-01 ENCOUNTER — Other Ambulatory Visit: Payer: Self-pay

## 2021-04-01 DIAGNOSIS — R109 Unspecified abdominal pain: Secondary | ICD-10-CM

## 2021-04-01 NOTE — Progress Notes (Signed)
Referral faxed to [facility] for pt to see Dr. Alvan Dame.

## 2021-04-09 ENCOUNTER — Ambulatory Visit (INDEPENDENT_AMBULATORY_CARE_PROVIDER_SITE_OTHER): Payer: Medicaid Other | Admitting: Pulmonary Disease

## 2021-04-09 ENCOUNTER — Other Ambulatory Visit: Payer: Self-pay

## 2021-04-09 ENCOUNTER — Encounter: Payer: Self-pay | Admitting: Pulmonary Disease

## 2021-04-09 VITALS — BP 112/70 | HR 74 | Ht 66.0 in | Wt 198.2 lb

## 2021-04-09 DIAGNOSIS — J449 Chronic obstructive pulmonary disease, unspecified: Secondary | ICD-10-CM | POA: Diagnosis not present

## 2021-04-09 DIAGNOSIS — Z720 Tobacco use: Secondary | ICD-10-CM

## 2021-04-09 NOTE — Progress Notes (Signed)
Synopsis: Referred in January 2022 by [name] Kaiser, MD for shortness of breath  Subjective:   PATIENT ID: [name] GENDER: male DOB: 07/06/1966, MRN: [id number]  HPI  Chief Complaint  Patient presents with   Follow-up    3 mo f/u. States his breathing has been stable since last visit.    [name] is a 55 year old male, daily smoker with history of DVT/PE, crohn's disease s/p colectomy with ileostomy and GERD who returns to pulmonary clinic for follow up of asthma-copd overlap syndrome.   He continues to do well on dulera 200-58mg 2 puffs twice daily and spiriva 2.593m 2 puffs daily. He is rarely using albuterol as needed.  He has cut down to 0.25 pack per day from 1 pack per day when we first met.   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[facility]   right, s/p replacement, due to prednisone   [facility] disorder [facility]   [facility]   Cro[place]'s colitis ([facility]   s/p total colectomy with ileostomy in 2009   [facility]   ileoscopy: multiple ulcers likely secondary to [place] 02/11/[place]   Qualifier: Diagnosis of  By: ShZeb Lane   Depression    DV[place]deep venous thrombosis) ([facility]   right upper extremity due to PICC   Enterococcus UTI 2009   GERD (gastroesophageal reflux disease)    Hernia    Hyperlipidemia    Insomnia    Migraine headache    Nystagmus    PULMONARY EMBOLISM, HX OF 02/11/2009   Qualifier: Diagnosis of  By: ShZeb Lane   S/P endoscopy Sept 2012   mild gastritis, small hiatal hernia, no ulcers   Schizophrenia ([facility] [place]   Sch[place]ia, acute ([facility] [place]     Family History  Problem Relation Age of Onset   Diabetes Mother    Heart disease Mother    Diabetes Father    Heart disease Father    COPD Maternal Grandmother    Colon cancer Neg Hx      Social History   Socioeconomic History    Marital status: Single    Spouse name: Not on file   Number of children: Not on file   Years of education: Not on file   Highest education Lane: Not on file  Occupational History   Not on file  Tobacco Use   Smoking status: Every Day    Packs/day: 4.00    Years: 37.00    Pack years: 148.00    Types: Cigarettes   Smokeless tobacco: Never   Tobacco comments:    down to 4ppd 11/14/20  Vaping Use   Vaping Use: Never used  Substance and Sexual Activity   Alcohol use: No   Drug use: No   Sexual activity: Never  Other Topics Concern   Not on file  Social History Narrative   Girlfriend of six years, Rectal Cancer on Hospice (05/2011).   Social Determinants of Health   Financial Resource Strain: Not on file  Food Insecurity: Not on file  Transportation Needs: No Transportation Needs   Lack of Transportation (Medical): No   Lack of Transportation (Non-Medical): No  Physical Activity: Not on file  Stress: Not on file  Social Connections: Not on file  Intimate Partner Violence: Not  At Risk   Fear of Current or Ex-Partner: No   Emotionally Abused: No   Physically Abused: No   Sexually Abused: No     Allergies  Allergen Reactions   Other Shortness Of Breath    Other reaction(s): Shortness Of Breath Throat swells Throat swells   Aspirin     REACTION: unknown reaction   Bactrim [Sulfamethoxazole-Trimethoprim]     ABD CRAMPS AND DIARRHEA   Codeine     REACTION: unknown reaction   Ibuprofen     REACTION: unknown reaction   Oxycodone-Acetaminophen Other (See Comments)    Other reaction(s): Swelling   Penicillins     REACTION: unknown reaction   Remicade [Infliximab]     COULDN'T BREATHE   Tramadol Nausea Only    Other reaction(s): Nausea   Tramadol Hcl     REACTION: unknown reaction   Vicodin [Hydrocodone-Acetaminophen] Nausea Only     Outpatient Medications Prior to Visit  Medication Sig Dispense Refill   ACCU-CHEK AVIVA PLUS test strip SMARTSIG:Via Meter      acetaminophen (TYLENOL) 500 MG tablet Take 1,000 mg by mouth every 6 (six) hours as needed. For pain (Patient not taking: Reported on 04/22/2021)     albuterol (PROVENTIL) (2.5 MG/3ML) 0.083% nebulizer solution Take 2.5 mg by nebulization every 6 (six) hours as needed for wheezing or shortness of breath. (Patient not taking: Reported on 04/22/2021)     albuterol (VENTOLIN HFA) 108 (90 Base) MCG/ACT inhaler Inhale 2 puffs into the lungs every 6 (six) hours as needed for wheezing or shortness of breath. (Patient not taking: Reported on 04/22/2021)     atorvastatin (LIPITOR) 80 MG tablet Take 80 mg by mouth daily.     azelastine (ASTELIN) 0.1 % nasal spray Place 2 sprays into both nostrils 2 (two) times daily.     baclofen (LIORESAL) 10 MG tablet Take 10 mg by mouth daily.     benztropine (COGENTIN) 0.5 MG tablet Take 0.5 mg by mouth daily.      CALCIUM 600/VITAMIN D3 600-800 MG-UNIT TABS TAKE (1) TABLET BY MOUTH THREE TIMES DAILY AFTER MEALS. 84 tablet 5   Cyanocobalamin (B-12) 1000 MCG CAPS Take by mouth daily.     dicyclomine (BENTYL) 10 MG capsule TAKE 1 OR 2 CAPSULE BY MOUTH AS DIRECTED BEFORE MEALS AND AT BEDTIME. (Patient taking differently: As needed) 60 capsule 3   divalproex (DEPAKOTE ER) 250 MG 24 hr tablet Take 250 mg by mouth daily.      divalproex (DEPAKOTE) 250 MG DR tablet Take by mouth at bedtime.     divalproex (DEPAKOTE) 500 MG 24 hr tablet Take 1,000 mg by mouth every evening.     divalproex (DEPAKOTE) 500 MG DR tablet Take 500 mg by mouth 2 (two) times daily.     docusate sodium (COLACE) 100 MG capsule Take 1 capsule (100 mg total) by mouth 2 (two) times daily. (Patient taking differently: Take 100 mg by mouth 2 (two) times daily as needed.) 10 capsule 0   ELIQUIS 5 MG TABS tablet Take 5 mg by mouth 2 (two) times daily.     furosemide (LASIX) 40 MG tablet Take 40 mg by mouth daily.     gabapentin (NEURONTIN) 300 MG capsule Take 900 mg by mouth 3 (three) times daily.     meloxicam  (MOBIC) 7.5 MG tablet Take 7.5 mg by mouth daily.     mercaptopurine (PURINETHOL) 50 MG tablet TAKE 2 TABLETS DAILY ON EMPTY STOMACH 1 HOUR BEFORE OR 2  HOURS AFTER MEAL 60 tablet 5   metFORMIN (GLUCOPHAGE-XR) 500 MG 24 hr tablet Take 1,000 mg by mouth 2 (two) times daily.     methotrexate 2.5 MG tablet Take 20 mg by mouth once a week.     metoprolol succinate (TOPROL-XL) 25 MG 24 hr tablet Take 1 tablet (25 mg total) by mouth daily. Take with or immediately following a meal. 30 tablet 3   mirtazapine (REMERON) 15 MG tablet Take 15 mg by mouth at bedtime.      Misc. Devices (WALKER WHEELS) MISC USE AS DIRECTED 1 each 0   mometasone-formoterol (DULERA) 200-5 MCG/ACT AERO Inhale 2 puffs into the lungs in the morning and at bedtime. 13 g 5   nitroGLYCERIN (NITROSTAT) 0.4 MG SL tablet Place 1 tablet (0.4 mg total) under the tongue every 5 (five) minutes as needed for chest pain. (Patient not taking: Reported on 04/22/2021) 25 tablet 3   Ostomy Supplies (ACTIVE LIFE 1-PC DRAIN [phone]) Pouch MISC      pantoprazole (PROTONIX) 40 MG tablet TAKE (1) TABLET BY MOUTH TWICE A DAY BEFORE MEALS. (BREAKFAST AND SUPPER) 56 tablet 11   potassium chloride SA (K-DUR,KLOR-CON) 20 MEQ tablet Take 20 mEq by mouth daily.      risperiDONE (RISPERDAL) 1 MG tablet Take 1 tablet by mouth at bedtime.     sertraline (ZOLOFT) 50 MG tablet Take 50 mg by mouth daily.     tamsulosin (FLOMAX) 0.4 MG CAPS capsule Take 0.4 mg by mouth daily.     Tiotropium Bromide Monohydrate (SPIRIVA RESPIMAT) 2.5 MCG/ACT AERS Inhale 2 puffs into the lungs daily. 4 g 5   traZODone (DESYREL) 50 MG tablet Take 50 mg by mouth at bedtime.     risperiDONE (RISPERDAL) 2 MG tablet Take 2 mg by mouth at bedtime.      folic acid (FOLVITE) 1 MG tablet Take 1 mg by mouth daily.     No facility-administered medications prior to visit.    Review of Systems  Constitutional:  Negative for chills, fever, malaise/fatigue and weight loss.  HENT:  Negative for  congestion, sinus pain and sore throat.   Eyes: Negative.   Respiratory:  Negative for cough, hemoptysis, sputum production, shortness of breath and wheezing.   Cardiovascular:  Negative for chest pain, palpitations, orthopnea, claudication and leg swelling.  Gastrointestinal:  Negative for abdominal pain, heartburn, nausea and vomiting.  Genitourinary: Negative.   Musculoskeletal:  Negative for joint pain and myalgias.  Skin:  Negative for rash.  Neurological:  Negative for weakness.  Endo/Heme/Allergies: Negative.   Psychiatric/Behavioral: Negative.     Objective:   Vitals:   04/09/21 1622  BP: 112/70  Pulse: 74  SpO2: 96%  Weight: 198 lb 3.2 oz (89.9 kg)  Height: 5' 6"  (1.676 m)     Physical Exam Constitutional:      General: He is not in acute distress. HENT:     Head: Normocephalic and atraumatic.  Eyes:     Extraocular Movements: Extraocular movements intact.     Conjunctiva/sclera: Conjunctivae normal.     Pupils: Pupils are equal, round, and reactive to light.  Cardiovascular:     Rate and Rhythm: Normal rate and regular rhythm.     Pulses: Normal pulses.     Heart sounds: Normal heart sounds. No murmur heard. Pulmonary:     Effort: Pulmonary effort is normal.     Breath sounds: No wheezing or rhonchi.  Abdominal:     General: Bowel sounds are normal.  Palpations: Abdomen is soft.  Musculoskeletal:     Right lower leg: No edema.     Left lower leg: No edema.  Skin:    General: Skin is warm and dry.  Neurological:     General: No focal deficit present.     Mental Status: He is alert.  Psychiatric:        Mood and Affect: Mood normal.        Behavior: Behavior normal.        Thought Content: Thought content normal.        Judgment: Judgment normal.   CBC    Component Value Date/Time   WBC 2.6 (L) 04/22/2021 1429   RBC 2.62 (L) 04/22/2021 1429   HGB 10.7 (L) 04/22/2021 1429   HCT 30.3 (L) 04/22/2021 1429   HCT 42 05/24/2011 1417   PLT 159  04/22/2021 1429   MCV 115.6 (H) 04/22/2021 1429   MCH 40.8 (H) 04/22/2021 1429   MCHC 35.3 04/22/2021 1429   RDW 15.2 04/22/2021 1429   LYMPHSABS 1.0 04/22/2021 1429   MONOABS 0.3 04/22/2021 1429   EOSABS 0.1 04/22/2021 1429   BASOSABS 0.0 04/22/2021 1429   BMP Latest Ref Rng & Units 04/22/2021 03/17/2021 10/10/2020  Glucose 70 - 99 mg/dL 81 78 127  BUN 6 - 20 mg/dL 13 6(L) 11  Creatinine 0.61 - 1.24 mg/dL 0.82 0.65(L) 0.75  BUN/Creat Ratio 6 - 22 (calc) - 9 NOT APPLICABLE  Sodium 163 - 145 mmol/L 133(L) 131(L) 139  Potassium 3.5 - 5.1 mmol/L 4.5 4.6 4.9  Chloride 98 - 111 mmol/L 97(L) 96(L) 99  CO2 22 - 32 mmol/L 28 28 33(H)  Calcium 8.9 - 10.3 mg/dL 8.7(L) 8.6 10.1   Chest imaging: CT Coronary 08/07/20 Calcifications in the superior vena cava 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 Nonocclusive filling defect in the distal right lower lobe pulmonary artery branch extending into a segmental sized branch. Within the visualized portions of the thorax there are no suspicious appearing pulmonary nodules or masses, there is no acute consolidative airspace disease, no pleural effusions, no pneumothorax and no lymphadenopathy.  PFT: PFT Results Latest Ref Rng & Units 01/09/2021  FVC-Pre L 2.94  FVC-Predicted Pre % 68  FVC-Post L 3.42  FVC-Predicted Post % 80  Pre FEV1/FVC % % 59  Post FEV1/FCV % % 66  FEV1-Pre L 1.73  FEV1-Predicted Pre % 52  FEV1-Post L 2.27  DLCO uncorrected ml/min/mmHg 22.68  DLCO UNC% % 90  DLCO corrected ml/min/mmHg 22.68  DLCO COR %Predicted % 90  DLVA Predicted % 85  TLC L 6.84  TLC % Predicted % 110  RV % Predicted % 203  01/09/21: Moderately severe obstruction, signs of gas trapping present with significant bronchodilator response.   Echo 11/13/20:  1. Left ventricular ejection fraction, by estimation, is 55 to 60%. The  left ventricle has normal function. Left ventricular endocardial border  not optimally defined to evaluate regional wall motion.  Left ventricular  diastolic parameters were normal.   2. Left atrial size was mildly dilated.   3. The mitral valve is normal in structure. Trivial mitral valve  regurgitation.   4. The aortic valve is tricuspid. Mild aortic valve sclerosis is present,  with no evidence of aortic valve stenosis.   5. Tricuspid regurgitation signal is inadequate for assessing PA  pressure.   [place] lower extremity 06/05/20 No evidence of deep venous thrombosis seen in left lower extremity. Probable superficial thrombophlebitis seen involving the left superficial greater  saphenous vein.  Assessment & Plan:   Asthma-COPD overlap syndrome (HCC)  Tobacco use  Discussion: [name] is a 55 year old male, daily smoker with history of DVT/PE, crohn's disease s/p colectomy with ileostomy and GERD who returns to pulmonary clinic for follow up of asthma-copd overlap syndrome.   His respiratory symptoms are significantly improved on ICS/LAMA/LABA therapy. He is to continue dulera 200-50mg 2 puffs twice daily and spiriva 2.5 mcg 2 puffs daily. He can continue as needed albuterol.   He will continue to work on cutting back further on his daily cigarette use. He has made tremendous strides since our first visit and is now down to 0.25 pack per day.   He is on eliquis for DVT/PE.   He is enrolled in our lung cancer screening program.  Follow up in 6 months.  JFreda Jackson MD [facility] Office: [phone]  Current Outpatient Medications:    ACCU-CHEK AVIVA PLUS test strip, SMARTSIG:Via Meter, Disp: , Rfl:    acetaminophen (TYLENOL) 500 MG tablet, Take 1,000 mg by mouth every 6 (six) hours as needed. For pain (Patient not taking: Reported on 04/22/2021), Disp: , Rfl:    albuterol (PROVENTIL) (2.5 MG/3ML) 0.083% nebulizer solution, Take 2.5 mg by nebulization every 6 (six) hours as needed for wheezing or shortness of breath. (Patient not taking: Reported on 04/22/2021), Disp: , Rfl:     albuterol (VENTOLIN HFA) 108 (90 Base) MCG/ACT inhaler, Inhale 2 puffs into the lungs every 6 (six) hours as needed for wheezing or shortness of breath. (Patient not taking: Reported on 04/22/2021), Disp: , Rfl:    atorvastatin (LIPITOR) 80 MG tablet, Take 80 mg by mouth daily., Disp: , Rfl:    azelastine (ASTELIN) 0.1 % nasal spray, Place 2 sprays into both nostrils 2 (two) times daily., Disp: , Rfl:    baclofen (LIORESAL) 10 MG tablet, Take 10 mg by mouth daily., Disp: , Rfl:    benztropine (COGENTIN) 0.5 MG tablet, Take 0.5 mg by mouth daily. , Disp: , Rfl:    CALCIUM 600/VITAMIN D3 600-800 MG-UNIT TABS, TAKE (1) TABLET BY MOUTH THREE TIMES DAILY AFTER MEALS., Disp: 84 tablet, Rfl: 5   Cyanocobalamin (B-12) 1000 MCG CAPS, Take by mouth daily., Disp: , Rfl:    dicyclomine (BENTYL) 10 MG capsule, TAKE 1 OR 2 CAPSULE BY MOUTH AS DIRECTED BEFORE MEALS AND AT BEDTIME. (Patient taking differently: As needed), Disp: 60 capsule, Rfl: 3   divalproex (DEPAKOTE ER) 250 MG 24 hr tablet, Take 250 mg by mouth daily. , Disp: , Rfl:    divalproex (DEPAKOTE) 250 MG DR tablet, Take by mouth at bedtime., Disp: , Rfl:    divalproex (DEPAKOTE) 500 MG 24 hr tablet, Take 1,000 mg by mouth every evening., Disp: , Rfl:    divalproex (DEPAKOTE) 500 MG DR tablet, Take 500 mg by mouth 2 (two) times daily., Disp: , Rfl:    docusate sodium (COLACE) 100 MG capsule, Take 1 capsule (100 mg total) by mouth 2 (two) times daily. (Patient taking differently: Take 100 mg by mouth 2 (two) times daily as needed.), Disp: 10 capsule, Rfl: 0   ELIQUIS 5 MG TABS tablet, Take 5 mg by mouth 2 (two) times daily., Disp: , Rfl:    furosemide (LASIX) 40 MG tablet, Take 40 mg by mouth daily., Disp: , Rfl:    gabapentin (NEURONTIN) 300 MG capsule, Take 900 mg by mouth 3 (three) times daily., Disp: , Rfl:    meloxicam (  MOBIC) 7.5 MG tablet, Take 7.5 mg by mouth daily., Disp: , Rfl:    mercaptopurine (PURINETHOL) 50 MG tablet, TAKE 2 TABLETS DAILY ON  EMPTY STOMACH 1 HOUR BEFORE OR 2 HOURS AFTER MEAL, Disp: 60 tablet, Rfl: 5   metFORMIN (GLUCOPHAGE-XR) 500 MG 24 hr tablet, Take 1,000 mg by mouth 2 (two) times daily., Disp: , Rfl:    methotrexate 2.5 MG tablet, Take 20 mg by mouth once a week., Disp: , Rfl:    metoprolol succinate (TOPROL-XL) 25 MG 24 hr tablet, Take 1 tablet (25 mg total) by mouth daily. Take with or immediately following a meal., Disp: 30 tablet, Rfl: 3   mirtazapine (REMERON) 15 MG tablet, Take 15 mg by mouth at bedtime. , Disp: , Rfl:    Misc. Devices (WALKER WHEELS) MISC, USE AS DIRECTED, Disp: 1 each, Rfl: 0   mometasone-formoterol (DULERA) 200-5 MCG/ACT AERO, Inhale 2 puffs into the lungs in the morning and at bedtime., Disp: 13 g, Rfl: 5   nitroGLYCERIN (NITROSTAT) 0.4 MG SL tablet, Place 1 tablet (0.4 mg total) under the tongue every 5 (five) minutes as needed for chest pain. (Patient not taking: Reported on 04/22/2021), Disp: 25 tablet, Rfl: 3   Ostomy Supplies (ACTIVE LIFE 1-PC DRAIN [phone]) Pouch MISC, , Disp: , Rfl:    pantoprazole (PROTONIX) 40 MG tablet, TAKE (1) TABLET BY MOUTH TWICE A DAY BEFORE MEALS. (BREAKFAST AND SUPPER), Disp: 56 tablet, Rfl: 11   potassium chloride SA (K-DUR,KLOR-CON) 20 MEQ tablet, Take 20 mEq by mouth daily. , Disp: , Rfl:    risperiDONE (RISPERDAL) 1 MG tablet, Take 1 tablet by mouth at bedtime., Disp: , Rfl:    sertraline (ZOLOFT) 50 MG tablet, Take 50 mg by mouth daily., Disp: , Rfl:    tamsulosin (FLOMAX) 0.4 MG CAPS capsule, Take 0.4 mg by mouth daily., Disp: , Rfl:    Tiotropium Bromide Monohydrate (SPIRIVA RESPIMAT) 2.5 MCG/ACT AERS, Inhale 2 puffs into the lungs daily., Disp: 4 g, Rfl: 5   traZODone (DESYREL) 50 MG tablet, Take 50 mg by mouth at bedtime., Disp: , Rfl:

## 2021-04-09 NOTE — Patient Instructions (Addendum)
Continue dulera 2 puffs twice daily  Continue spiriva 2 puffs daily  Continue albuterol 1-2 puffs every 4-6 hours as needed  Call us if your symptoms worsen  I am proud that you have cut down to a quarter pack of cigarettes per day. Hopefully by next visit you will be completely off cigarettes.

## 2021-04-15 ENCOUNTER — Other Ambulatory Visit: Payer: Self-pay

## 2021-04-15 ENCOUNTER — Inpatient Hospital Stay (HOSPITAL_COMMUNITY): Payer: Medicaid Other | Attending: Hematology

## 2021-04-15 DIAGNOSIS — Z86718 Personal history of other venous thrombosis and embolism: Secondary | ICD-10-CM | POA: Diagnosis not present

## 2021-04-15 DIAGNOSIS — R634 Abnormal weight loss: Secondary | ICD-10-CM | POA: Diagnosis not present

## 2021-04-15 DIAGNOSIS — D61818 Other pancytopenia: Secondary | ICD-10-CM | POA: Diagnosis not present

## 2021-04-15 DIAGNOSIS — Z86711 Personal history of pulmonary embolism: Secondary | ICD-10-CM | POA: Diagnosis not present

## 2021-04-15 DIAGNOSIS — D649 Anemia, unspecified: Secondary | ICD-10-CM | POA: Insufficient documentation

## 2021-04-15 DIAGNOSIS — I82409 Acute embolism and thrombosis of unspecified deep veins of unspecified lower extremity: Secondary | ICD-10-CM

## 2021-04-15 LAB — D-DIMER, QUANTITATIVE: D-Dimer, Quant: 0.31 ug/mL-FEU (ref 0.00–0.50)

## 2021-04-22 ENCOUNTER — Other Ambulatory Visit: Payer: Self-pay

## 2021-04-22 ENCOUNTER — Inpatient Hospital Stay (HOSPITAL_BASED_OUTPATIENT_CLINIC_OR_DEPARTMENT_OTHER): Payer: Medicaid Other | Admitting: Physician Assistant

## 2021-04-22 ENCOUNTER — Inpatient Hospital Stay (HOSPITAL_COMMUNITY): Payer: Medicaid Other

## 2021-04-22 VITALS — BP 104/60 | HR 81 | Temp 98.8°F | Resp 16 | Wt 196.0 lb

## 2021-04-22 DIAGNOSIS — Z86718 Personal history of other venous thrombosis and embolism: Secondary | ICD-10-CM | POA: Diagnosis not present

## 2021-04-22 DIAGNOSIS — D61818 Other pancytopenia: Secondary | ICD-10-CM | POA: Diagnosis not present

## 2021-04-22 DIAGNOSIS — I82409 Acute embolism and thrombosis of unspecified deep veins of unspecified lower extremity: Secondary | ICD-10-CM | POA: Diagnosis not present

## 2021-04-22 LAB — CBC WITH DIFFERENTIAL/PLATELET
Abs Immature Granulocytes: 0.04 10*3/uL (ref 0.00–0.07)
Basophils Absolute: 0 10*3/uL (ref 0.0–0.1)
Basophils Relative: 0 %
Eosinophils Absolute: 0.1 10*3/uL (ref 0.0–0.5)
Eosinophils Relative: 2 %
HCT: 30.3 % — ABNORMAL LOW (ref 39.0–52.0)
Hemoglobin: 10.7 g/dL — ABNORMAL LOW (ref 13.0–17.0)
Immature Granulocytes: 1 %
Lymphocytes Relative: 38 %
Lymphs Abs: 1 10*3/uL (ref 0.7–4.0)
MCH: 40.8 pg — ABNORMAL HIGH (ref 26.0–34.0)
MCHC: 35.3 g/dL (ref 30.0–36.0)
MCV: 115.6 fL — ABNORMAL HIGH (ref 80.0–100.0)
Monocytes Absolute: 0.3 10*3/uL (ref 0.1–1.0)
Monocytes Relative: 9 %
Neutro Abs: 1.4 10*3/uL — ABNORMAL LOW (ref 1.7–7.7)
Neutrophils Relative %: 50 %
Platelets: 159 10*3/uL (ref 150–400)
RBC: 2.62 MIL/uL — ABNORMAL LOW (ref 4.22–5.81)
RDW: 15.2 % (ref 11.5–15.5)
WBC: 2.6 10*3/uL — ABNORMAL LOW (ref 4.0–10.5)
nRBC: 0 % (ref 0.0–0.2)

## 2021-04-22 LAB — COMPREHENSIVE METABOLIC PANEL
ALT: 47 U/L — ABNORMAL HIGH (ref 0–44)
AST: 47 U/L — ABNORMAL HIGH (ref 15–41)
Albumin: 3.3 g/dL — ABNORMAL LOW (ref 3.5–5.0)
Alkaline Phosphatase: 98 U/L (ref 38–126)
Anion gap: 8 (ref 5–15)
BUN: 13 mg/dL (ref 6–20)
CO2: 28 mmol/L (ref 22–32)
Calcium: 8.7 mg/dL — ABNORMAL LOW (ref 8.9–10.3)
Chloride: 97 mmol/L — ABNORMAL LOW (ref 98–111)
Creatinine, Ser: 0.82 mg/dL (ref 0.61–1.24)
GFR, Estimated: >60 mL/min (ref >60)
Glucose, Bld: 81 mg/dL (ref 70–99)
Potassium: 4.5 mmol/L (ref 3.5–5.1)
Sodium: 133 mmol/L — ABNORMAL LOW (ref 135–145)
Total Bilirubin: 1.1 mg/dL (ref 0.3–1.2)
Total Protein: 6.2 g/dL — ABNORMAL LOW (ref 6.5–8.1)

## 2021-04-22 LAB — IRON AND TIBC
Iron: 121 ug/dL (ref 45–182)
Saturation Ratios: 54 % — ABNORMAL HIGH (ref 17.9–39.5)
TIBC: 224 ug/dL — ABNORMAL LOW (ref 250–450)
UIBC: 103 ug/dL

## 2021-04-22 LAB — FOLATE: Folate: 11.8 ng/mL (ref >5.9)

## 2021-04-22 LAB — FERRITIN: Ferritin: 304 ng/mL (ref 24–336)

## 2021-04-22 LAB — VITAMIN B12: Vitamin B-12: 408 pg/mL (ref 180–914)

## 2021-04-22 LAB — LACTATE DEHYDROGENASE: LDH: 179 U/L (ref 98–192)

## 2021-04-22 NOTE — Patient Instructions (Signed)
[name] at [name] Discharge Instructions  You were seen today by [name] Abernethy PA-C for your history of blood clots and your low blood counts.  HISTORY OF BLOOD CLOTS: Continue Eliquis 5 mg twice daily.  You will need to continue this lifelong, unless you experience a significant or life-threatening bleeding event.  Please seek immediate medical attention if you notice any significant blood loss such as a nosebleed that will not stop, or bloody bowel movements (which may look like bright red blood in the toilet or black tarry stool).  PANCYTOPENIA: "Pancytopenia" means that all 3 of the major types of blood cells (white blood cells, red blood cells, and platelets) are lower than normal.  We do not yet know what is causing this.  We will check labs today and have you come back in 1 month to discuss these results and any further tests that may be necessary.  LABS: Labs today before leaving the clinic  OTHER TESTS: None at this time  MEDICATIONS: Continue Eliquis 5 mg twice daily.  Continue all of your other previously prescribed home medications.  FOLLOW-UP APPOINTMENT: Office visit in 1 month to discuss results and next steps.   Thank you for choosing [name] at [name] to provide your oncology and hematology care.  To afford each patient quality time with our provider, please arrive at least 15 minutes before your scheduled appointment time.   If you have a lab appointment with the [name] please come in thru the Main Entrance and check in at the main information desk.  You need to re-schedule your appointment should you arrive 10 or more minutes late.  We strive to give you quality time with our providers, and arriving late affects you and other patients whose appointments are after yours.  Also, if you no show three or more times for appointments you may be dismissed from the clinic at the providers discretion.      Again, thank you for choosing [name].  Our hope is that these requests will decrease the amount of time that you wait before being seen by our physicians.       _____________________________________________________________  Should you have questions after your visit to [name], please contact our office at [phone] and follow the prompts.  Our office hours are 8:00 a.m. and 4:30 p.m. Monday - Friday.  Please note that voicemails left after 4:00 p.m. may not be returned until the following business day.  We are closed weekends and major holidays.  You do have access to a nurse 24-7, just call the main number to the clinic [phone] and do not press any options, hold on the line and a nurse will answer the phone.    For prescription refill requests, have your pharmacy contact our office and allow 72 hours.    Due to Covid, you will need to wear a mask upon entering the hospital. If you do not have a mask, a mask will be given to you at the Main Entrance upon arrival. For doctor visits, patients may have 1 support person age 56 or older with them. For treatment visits, patients can not have anyone with them due to social distancing guidelines and our immunocompromised population.

## 2021-04-22 NOTE — Progress Notes (Signed)
[name], [place] [address]   CLINIC:  Medical Oncology/Hematology  PCP:  [name], MD [address] [place] [address] [phone]   REASON FOR VISIT:  Follow-up for recurrent DVTs  PRIOR THERAPY: None  CURRENT THERAPY: Eliquis 5 mg twice daily.  INTERVAL HISTORY:  [name] 55 y.o. male returns for routine follow-up of his recurrent DVTs.  He was last seen by Dr. Delton Coombes on 12/04/2020.  At today's visit, he reports feeling fair.  No recent hospitalizations, surgeries, or changes in baseline health status.  Mr. [name] reports that he is compliant with Eliquis without any missed doses.  He does report easy bruising, but denies bleeding such as epistaxis, bright red blood per rectum, or melena.  He continues to have some mild left lower extremity edema secondary to chronic DVT, also reports chronic left lower extremity pain (rated a 6/10, described as an aching pain) which is worse with ambulation.  He does have chronic intermittent chest pain, and is seen by cardiology who have prescribed him nitroglycerin.  He is currently chest pain-free today.  He denies dyspnea and syncopal episodes.  He does sometimes have some palpitations.  He has been more fatigued over the past 3 to 4 weeks, reports that he is sleeping more than he used to.  He denies fever/chills but reports night sweats for the past week.  He reports decreased appetite and some slow weight loss over the past several months.  His weight is down about 10 pounds compared to November 2021.  He has 25% energy and 40% appetite.    REVIEW OF SYSTEMS:  Review of Systems  Constitutional:  Positive for appetite change, diaphoresis and fatigue. Negative for chills, fever and unexpected weight change.  HENT:   Negative for lump/mass and nosebleeds.   Eyes:  Negative for eye problems.  Respiratory:  Negative for cough, hemoptysis and shortness of breath.   Cardiovascular:   Positive for chest pain (occassional, none today), leg swelling (left leg, chronic DVT) and palpitations.  Gastrointestinal:  Negative for abdominal pain, blood in stool, constipation, diarrhea, nausea and vomiting.  Genitourinary:  Negative for hematuria.   Skin: Negative.   Neurological:  Negative for dizziness, headaches and light-headedness.  Hematological:  Bruises/bleeds easily.     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[place])    right, s/p replacement, due to prednisone   Bipolar disorder ([place])    Colostomy in place [facility])    Crohn's colitis ([place])    s/p total colectomy with ileostomy in 2009   Crohn's disease of small intestine [facility]) Sept 2012   ileoscopy: multiple ulcers likely secondary to [place], HX OF 02/11/2009   Qualifier: Diagnosis of  By: Zeb Comfort     Depression    DVT (deep venous thrombosis) ([place]) 2009   right upper extremity due to PICC   Enterococcus UTI 2009   GERD (gastroesophageal reflux disease)    Hernia    Hyperlipidemia    Insomnia    Migraine headache    Nystagmus    PULMONARY EMBOLISM, HX OF 02/11/2009   Qualifier: Diagnosis of  By: Zeb Comfort     S/P endoscopy Sept 2012   mild gastritis, small hiatal hernia, no ulcers   Schizophrenia ([place])    Schizophrenia, acute ([place])    Past  Surgical History:  Procedure Laterality Date   APPENDECTOMY     ESOPHAGOGASTRODUODENOSCOPY  07/2010   gastritis,  bx neg for H.Pylori   ESOPHAGOGASTRODUODENOSCOPY N/A 01/08/2014   SLF:NO SOURCE FOR ODYNOPHAGIA IDENTIFIED/Multiple small ulcers in the gastric antrum   EYE SURGERY     ILEOSCOPY  06/29/2011   SLF: ulcers,multiple/small HH/mild gastritis   KIDNEY SURGERY     LAPAROTOMY N/A 06/05/2013   Procedure: EXPLORATORY LAPAROTOMY;  Surgeon: Donato Heinz, MD;  Location: [facility];  Service: General;  Laterality: N/A;   LYSIS OF ADHESION N/A  06/05/2013   Procedure: LYSIS OF ADHESIONS;  Surgeon: Donato Heinz, MD;  Location: [facility];  Service: General;  Laterality: N/A;   small bowel capsule study  08/2010   few tiny nonbleeding erosions/ulcers ?secondary to relafen or Crohn's. Entire small bowel not seen.    TOTAL COLECTOMY  2009   with ileostomy at [facility] for refractory disease    TOTAL HIP ARTHROPLASTY     right, due to avascular necrosis from chronic prednisone   TOTAL SHOULDER REPLACEMENT     bilateral   TRANSURETHRAL RESECTION OF PROSTATE N/A 12/03/2019   Procedure: TRANSURETHRAL RESECTION OF THE PROSTATE (TURP);  Surgeon: Cleon Gustin, MD;  Location: [facility];  Service: Urology;  Laterality: N/A;     SOCIAL HISTORY:  Social History   Socioeconomic History   Marital status: Single    Spouse name: Not on file   Number of children: Not on file   Years of education: Not on file   Highest education level: Not on file  Occupational History   Not on file  Tobacco Use   Smoking status: Every Day    Packs/day: 4.00    Years: 37.00    Pack years: 148.00    Types: Cigarettes   Smokeless tobacco: Never   Tobacco comments:    down to 4ppd 11/14/20  Vaping Use   Vaping Use: Never used  Substance and Sexual Activity   Alcohol use: No   Drug use: No   Sexual activity: Never  Other Topics Concern   Not on file  Social History Narrative   Girlfriend of six years, Rectal Cancer on Hospice (05/2011).   Social Determinants of Health   Financial Resource Strain: Not on file  Food Insecurity: Not on file  Transportation Needs: No Transportation Needs   Lack of Transportation (Medical): No   Lack of Transportation (Non-Medical): No  Physical Activity: Not on file  Stress: Not on file  Social Connections: Not on file  Intimate Partner Violence: Not At Risk   Fear of Current or Ex-Partner: No   Emotionally Abused: No   Physically Abused: No   Sexually Abused: No    FAMILY HISTORY:  Family History  Problem  Relation Age of Onset   Diabetes Mother    Heart disease Mother    Diabetes Father    Heart disease Father    COPD Maternal Grandmother    Colon cancer Neg Hx     CURRENT MEDICATIONS:  Outpatient Encounter Medications as of 04/22/2021  Medication Sig   ACCU-CHEK AVIVA PLUS test strip SMARTSIG:Via Meter   acetaminophen (TYLENOL) 500 MG tablet Take 1,000 mg by mouth every 6 (six) hours as needed. For pain   albuterol (PROVENTIL) (2.5 MG/3ML) 0.083% nebulizer solution Take 2.5 mg by nebulization every 6 (six) hours as needed for wheezing or shortness of breath.   albuterol (VENTOLIN HFA) 108 (90 Base) MCG/ACT inhaler Inhale 2 puffs into  the lungs every 6 (six) hours as needed for wheezing or shortness of breath.   atorvastatin (LIPITOR) 80 MG tablet Take 80 mg by mouth daily.   azelastine (ASTELIN) 0.1 % nasal spray Place 2 sprays into both nostrils 2 (two) times daily.   baclofen (LIORESAL) 10 MG tablet Take 10 mg by mouth daily.   benztropine (COGENTIN) 0.5 MG tablet Take 0.5 mg by mouth daily.    CALCIUM 600/VITAMIN D3 600-800 MG-UNIT TABS TAKE (1) TABLET BY MOUTH THREE TIMES DAILY AFTER MEALS.   Cyanocobalamin (B-12) 1000 MCG CAPS Take by mouth daily.   dicyclomine (BENTYL) 10 MG capsule TAKE 1 OR 2 CAPSULE BY MOUTH AS DIRECTED BEFORE MEALS AND AT BEDTIME. (Patient taking differently: As needed)   divalproex (DEPAKOTE ER) 250 MG 24 hr tablet Take 250 mg by mouth daily.    divalproex (DEPAKOTE) 250 MG DR tablet Take by mouth at bedtime.   divalproex (DEPAKOTE) 500 MG 24 hr tablet Take 1,000 mg by mouth every evening.   divalproex (DEPAKOTE) 500 MG DR tablet Take 500 mg by mouth 2 (two) times daily.   docusate sodium (COLACE) 100 MG capsule Take 1 capsule (100 mg total) by mouth 2 (two) times daily. (Patient taking differently: Take 100 mg by mouth 2 (two) times daily as needed.)   ELIQUIS 5 MG TABS tablet Take 5 mg by mouth 2 (two) times daily.   furosemide (LASIX) 40 MG tablet Take 40 mg  by mouth daily.   gabapentin (NEURONTIN) 300 MG capsule Take 900 mg by mouth 3 (three) times daily.   meloxicam (MOBIC) 7.5 MG tablet Take 7.5 mg by mouth daily.   mercaptopurine (PURINETHOL) 50 MG tablet TAKE 2 TABLETS DAILY ON EMPTY STOMACH 1 HOUR BEFORE OR 2 HOURS AFTER MEAL   metFORMIN (GLUCOPHAGE-XR) 500 MG 24 hr tablet Take 1,000 mg by mouth 2 (two) times daily.   methotrexate 2.5 MG tablet Take 20 mg by mouth once a week.   metoprolol succinate (TOPROL-XL) 25 MG 24 hr tablet Take 1 tablet (25 mg total) by mouth daily. Take with or immediately following a meal.   mirtazapine (REMERON) 15 MG tablet Take 15 mg by mouth at bedtime.    Misc. Devices (WALKER WHEELS) MISC USE AS DIRECTED   mometasone-formoterol (DULERA) 200-5 MCG/ACT AERO Inhale 2 puffs into the lungs in the morning and at bedtime.   nitroGLYCERIN (NITROSTAT) 0.4 MG SL tablet Place 1 tablet (0.4 mg total) under the tongue every 5 (five) minutes as needed for chest pain.   Ostomy Supplies (ACTIVE LIFE 1-PC DRAIN [phone]) Pouch MISC    pantoprazole (PROTONIX) 40 MG tablet TAKE (1) TABLET BY MOUTH TWICE A DAY BEFORE MEALS. (BREAKFAST AND SUPPER)   potassium chloride SA (K-DUR,KLOR-CON) 20 MEQ tablet Take 20 mEq by mouth daily.    risperiDONE (RISPERDAL) 1 MG tablet Take 1 tablet by mouth at bedtime.   risperiDONE (RISPERDAL) 2 MG tablet Take 2 mg by mouth at bedtime.    sertraline (ZOLOFT) 50 MG tablet Take 50 mg by mouth daily.   tamsulosin (FLOMAX) 0.4 MG CAPS capsule Take 0.4 mg by mouth daily.   Tiotropium Bromide Monohydrate (SPIRIVA RESPIMAT) 2.5 MCG/ACT AERS Inhale 2 puffs into the lungs daily.   traZODone (DESYREL) 50 MG tablet Take 50 mg by mouth at bedtime.   No facility-administered encounter medications on file as of 04/22/2021.    ALLERGIES:  Allergies  Allergen Reactions   Other Shortness Of Breath    Other reaction(s): Shortness  Of Breath Throat swells Throat swells   Aspirin     REACTION: unknown reaction    Bactrim [Sulfamethoxazole-Trimethoprim]     ABD CRAMPS AND DIARRHEA   Codeine     REACTION: unknown reaction   Ibuprofen     REACTION: unknown reaction   Oxycodone-Acetaminophen Other (See Comments)    Other reaction(s): Swelling   Penicillins     REACTION: unknown reaction   Remicade [Infliximab]     COULDN'T BREATHE   Tramadol Nausea Only    Other reaction(s): Nausea   Tramadol Hcl     REACTION: unknown reaction   Vicodin [Hydrocodone-Acetaminophen] Nausea Only     PHYSICAL EXAM:  ECOG PERFORMANCE STATUS: 2 - Symptomatic, <50% confined to bed  There were no vitals filed for this visit. There were no vitals filed for this visit. Physical Exam Constitutional:      Appearance: Normal appearance. He is obese.  HENT:     Head: Normocephalic and atraumatic.     Mouth/Throat:     Mouth: Mucous membranes are moist.  Eyes:     Extraocular Movements: Extraocular movements intact.     Pupils: Pupils are equal, round, and reactive to light.     Comments: Bilateral nystagmus  Cardiovascular:     Rate and Rhythm: Normal rate and regular rhythm.     Pulses: Normal pulses.     Heart sounds: Normal heart sounds.  Pulmonary:     Effort: Pulmonary effort is normal.     Comments: Coarse breath sounds with prolonged expiratory phase Abdominal:     General: Bowel sounds are normal.     Palpations: Abdomen is soft.     Tenderness: There is no abdominal tenderness.  Musculoskeletal:        General: No swelling.     Right lower leg: No edema.     Left lower leg: Edema (trace) present.  Lymphadenopathy:     Cervical: No cervical adenopathy.  Skin:    General: Skin is warm and dry.  Neurological:     General: No focal deficit present.     Mental Status: He is alert and oriented to person, place, and time.  Psychiatric:        Mood and Affect: Mood normal.        Behavior: Behavior normal.     LABORATORY DATA:  I have reviewed the labs as listed.  CBC    Component Value  Date/Time   WBC 2.5 (L) 03/17/2021 1612   RBC 3.06 (L) 03/17/2021 1612   HGB 12.1 (L) 03/17/2021 1612   HCT 33.8 (L) 03/17/2021 1612   HCT 42 05/24/2011 1417   PLT 106 (L) 03/17/2021 1612   MCV 110.5 (H) 03/17/2021 1612   MCH 39.5 (H) 03/17/2021 1612   MCHC 35.8 03/17/2021 1612   RDW 16.3 (H) 03/17/2021 1612   LYMPHSABS 773 (L) 03/17/2021 1612   MONOABS 0.4 02/14/2018 0458   EOSABS 40 03/17/2021 1612   BASOSABS 10 03/17/2021 1612   CMP Latest Ref Rng & Units 03/17/2021 10/10/2020 07/31/2020  Glucose 65 - 99 mg/dL 78 127 100(H)  BUN 7 - 25 mg/dL 6(L) 11 7  Creatinine 0.70 - 1.33 mg/dL 0.65(L) 0.75 0.77  Sodium 135 - 146 mmol/L 131(L) 139 141  Potassium 3.5 - 5.3 mmol/L 4.6 4.9 5.0  Chloride 98 - 110 mmol/L 96(L) 99 103  CO2 20 - 32 mmol/L 28 33(H) 24  Calcium 8.6 - 10.3 mg/dL 8.6 10.1 9.5  Total Protein 6.1 -  8.1 g/dL 5.8(L) 6.8 -  Total Bilirubin 0.2 - 1.2 mg/dL 1.3(H) 0.6 -  Alkaline Phos 38 - 126 U/L - - -  AST 10 - 35 U/L 39(H) 30 -  ALT 9 - 46 U/L 33 46 -    DIAGNOSTIC IMAGING:  I have independently reviewed the relevant imaging and discussed with the patient.  ASSESSMENT & PLAN: 1.  Recurrent DVTs with history of PE - Initial VTE events in 2009, around the same time the patient had colostomy placed - History of pulmonary embolism with CT angio on 01/31/2008 which was positive for PE and bilateral lower lobe pulmonary infarcts, likely source suspected to be left subclavian Port-A-Cath which appeared to have contiguous embolus extending from the entry site into the left subclavian vein into the SVC - Venous ultrasound imaging (01/31/2008): Extensive deep vein thrombosis of left upper extremity extending into the jugular vein - Hypercoagulable panel was performed in 2009, which showed normal protein S, was negative for factor V Leiden gene mutation - Left lower extremity venous duplex (06/05/2020): Probable superficial thrombophlebitis in the left superficial greater saphenous  vein - Patient reported that symptoms had worsened, and a repeat venous duplex performed in person [place] on 06/19/2020 showed positive DVT involving common left femoral, profundofemoral, and superficial femoral veins - he was started on Eliquis at this time - Most recent venous duplex of left lower extremity (12/02/2020): Nonocclusive thrombus within the common femoral, profundofemoral, and superficial femoral veins on the left, felt to represent chronic recanalized thrombus - Patient has family history of unprovoked VTE in several family members on his mother side - Further hypercoagulable work-up has been deferred, as it would not change the patient's need for lifelong anticoagulation at this point - Patient currently on Eliquis 5 mg twice daily (started after unprovoked DVT in September 2021), has previously been on warfarin (for treatment of upper extremity DVT/PE in 2009) - Most recent D-dimer (04/15/2021): 0.31 - PLAN: Continue Eliquis (lifelong anticoagulation)  2.  Pancytopenia with macrocytic anemia - Labs obtained at external lab [facility]) on 04/07/2021 show new pancytopenia with WBC 2.3 (ANC 1.4, lymphocytes 0.6), platelets 113, Hgb 11.4 with MCV 111.0 - Patient reports fatigue, night sweats for the past week, slow unintentional weight loss (see below) - Denies fever, chills, frequent infections - If work-up does not reveal obvious cause of pancytopenia, would consider that this may be a medication effect (mercaptopurine and others) or related to his chronic autoimmune disease (Crohn's disease) - PLAN: Labs today with repeat CBC, CMP, nutritional panel, SPEP/MGUS panel.  RTC in 1 month to discuss results.  3.  Unintentional weight loss -Patient reports poor appetite and about a 10 pound unintentional weight loss since November 2021 - PLAN: We will continue to monitor at follow-up visit.  If continued significant weight loss, would consider imaging studies if indicated.  4.  Other  history - Patient has extensive other conditions including Crohn's disease, bipolar affective disorder, asthma, and other conditions noted elsewhere in medical record   PLAN SUMMARY & DISPOSITION: - Labs today - RTC in 4 weeks for office visit and repeat CBC  All questions were answered. The patient knows to call the clinic with any problems, questions or concerns.  Medical decision making: Moderate  Time spent on visit: I spent 20 minutes counseling the patient face to face. The total time spent in the appointment was 30 minutes and more than 50% was on counseling.   Harriett Rush, PA-C  04/22/2021 5:54  PM

## 2021-04-23 ENCOUNTER — Encounter (HOSPITAL_COMMUNITY): Payer: Medicaid Other | Admitting: Dietician

## 2021-04-23 ENCOUNTER — Telehealth (HOSPITAL_COMMUNITY): Payer: Self-pay | Admitting: Dietician

## 2021-04-23 LAB — RHEUMATOID FACTOR: Rheumatoid fact SerPl-aCnc: <10 IU/mL (ref <14.0)

## 2021-04-23 LAB — KAPPA/LAMBDA LIGHT CHAINS
Kappa free light chain: 30.3 mg/L — ABNORMAL HIGH (ref 3.3–19.4)
Kappa, lambda light chain ratio: 1.22 (ref 0.26–1.65)
Lambda free light chains: 24.8 mg/L (ref 5.7–26.3)

## 2021-04-23 LAB — ANA: Anti Nuclear Antibody (ANA): NEGATIVE

## 2021-04-23 NOTE — Telephone Encounter (Signed)
Nutrition Assessment   Reason for Assessment: MST (+wt loss, poor appetite)   ASSESSMENT: 55 year old male with recurrent DVT and pancytopenia  Past medical history includes DM, Crohn's disease s/p total colectomy with ileostomy in 2009, GERD, HLD, pulmonary embolism, bipolar, schizophrenia.   Spoke with patient via telephone. Introduced International aid/development worker available at [employer]. Patient appreciative of phone call and agreeable to phone visit this morning. He reports tolerating regular diet, empties bag 6-8 times/day, having formed stool. Patient reports usually eating 3 meals/day + snacks. Eats eggs, sausage, toast, coffee with sweet and low for breakfast, ham/cheese and sliced tomato sandwich on wheat bread with Hawaiian punch for lunch, pack of cheese nabs for snack, eats balanced dinner with meat and veggies, drinks 1% milk or water. Lately, patient says he is tired and just not hungry, often skipping meals. Sometimes he does not eat until dinner time.   Nutrition Focused Physical Exam: unable to complete   Medications: Eliquis, Ca600+D3, Metformin, Depakote, B12, Colace, Klor-con, Gabapentin, Methotrexate   Labs: Na 133   Anthropometrics: Last weight 196 lb on 7/20 decreased 6 lbs (3%) in 5 weeks from 202 lb 3.2 oz on 6/14. This is insignificant for time frame  Height: 5'6" Weight: 88.9 kg (7/20) UBW: 202 lb (01/22) BMI: 31.64    NUTRITION DIAGNOSIS: Inadequate oral intake related to pancytopenia as evidenced by patient report of increased fatigue, poor appetite, and unintentional 6 lb (3%) weight loss over the last 5 weeks.     INTERVENTION:  Discussed strategies for poor appetite, will mail handout Educated on importance of not skipping meals, eating balanced meals and snacks - will mail snack ideas Recommended drinking oral nutrition supplement (Ensure Plus/equivalent - 350 kcal, 16 g protein) with decreased intake Will mail coupons Contact information provided     MONITORING, EVALUATION, GOAL: Patient will tolerate adequate calories and protein to minimize further weight loss   Next Visit: f/u via telephone Thursday September 1

## 2021-04-24 LAB — PATHOLOGIST SMEAR REVIEW

## 2021-04-24 LAB — COPPER, SERUM: Copper: 73 ug/dL (ref 69–132)

## 2021-04-25 LAB — IMMUNOFIXATION ELECTROPHORESIS
IgA: 307 mg/dL (ref 90–386)
IgG (Immunoglobin G), Serum: 1081 mg/dL (ref 603–1613)
IgM (Immunoglobulin M), Srm: 122 mg/dL (ref 20–172)
Total Protein ELP: 6 g/dL (ref 6.0–8.5)

## 2021-04-25 LAB — METHYLMALONIC ACID, SERUM: Methylmalonic Acid, Quantitative: 94 nmol/L (ref 0–378)

## 2021-04-28 LAB — PROTEIN ELECTROPHORESIS, SERUM
A/G Ratio: 1.2 (ref 0.7–1.7)
Albumin ELP: 3.2 g/dL (ref 2.9–4.4)
Alpha-1-Globulin: 0.3 g/dL (ref 0.0–0.4)
Alpha-2-Globulin: 0.4 g/dL (ref 0.4–1.0)
Beta Globulin: 0.8 g/dL (ref 0.7–1.3)
Gamma Globulin: 1.1 g/dL (ref 0.4–1.8)
Globulin, Total: 2.6 g/dL (ref 2.2–3.9)
Total Protein ELP: 5.8 g/dL — ABNORMAL LOW (ref 6.0–8.5)

## 2021-05-01 ENCOUNTER — Other Ambulatory Visit: Payer: Self-pay | Admitting: Gastroenterology

## 2021-05-01 ENCOUNTER — Telehealth: Payer: Self-pay | Admitting: Internal Medicine

## 2021-05-01 DIAGNOSIS — R109 Unspecified abdominal pain: Secondary | ICD-10-CM

## 2021-05-01 MED ORDER — DICYCLOMINE HCL 10 MG PO CAPS: ORAL_CAPSULE | ORAL | 1 refills | Status: DC

## 2021-05-01 NOTE — Telephone Encounter (Signed)
Spoke with patient.  Reports chronic history of intermittent postprandial abdominal cramping.  Seems to be a little more pronounced over the last few days.  Last for about 20 minutes then resolves.  Ostomy output is normal.  No bloody or black output.  No nausea or vomiting.  Previously he has taken dicyclomine for similar symptoms, but has not had dicyclomine in quite some time.    I will send in a prescription for dicyclomine 10 mg as needed up to 3 times daily before meals and at bedtime.  Hold in the setting of constipation.  Advised to proceed to emergency room if any worsening symptoms over the weekend and to let us know of any ongoing symptoms.

## 2021-05-01 NOTE — Telephone Encounter (Signed)
Phoned the pt back regarding his complaints and he stated to me that for the past 3 or 4 days every time he eats or drinks his entire stomach starts to cramp. No specific location of the abdomen. Pt states no nausea or vomiting. His bowel movements are fine. It's just the cramping of his abdomen. I asked the pt doesn't it sometimes do that with his medical condition and he stated yes but this was different. Not pain just cramping. He stated he has taken some pepto bismol and its not helping. Please advise.

## 2021-05-01 NOTE — Telephone Encounter (Signed)
Patient would like to speak to a nurse   states that when he eats he gets abdominal cramping.

## 2021-05-04 ENCOUNTER — Ambulatory Visit: Payer: Medicaid Other | Admitting: Cardiology

## 2021-05-05 ENCOUNTER — Telehealth (HOSPITAL_COMMUNITY): Payer: Self-pay

## 2021-05-05 NOTE — Telephone Encounter (Signed)
Patient called with complaints of leg pain and throbbing from thigh down. Notified Dr Raliegh Ip. About situation and Dr Raliegh Ip. Advised for patient to go to ED to be evaluated. Called patient back and transferred Dr Marthann Schiller message to patient of going to ED. Patient refused recommendation and stated that he didn't have a way to get to ED or get home. Patient then wanted an appointment to see Dr. Raliegh Ip. Patient was then told to call front desk about setting up appointment.

## 2021-05-06 ENCOUNTER — Encounter (HOSPITAL_COMMUNITY): Payer: Self-pay

## 2021-05-06 ENCOUNTER — Emergency Department (HOSPITAL_COMMUNITY)
Admission: EM | Admit: 2021-05-06 | Discharge: 2021-05-06 | Disposition: A | Discharge status: Home / Self Care | Payer: Medicaid Other | Attending: Emergency Medicine | Admitting: Emergency Medicine

## 2021-05-06 ENCOUNTER — Emergency Department (HOSPITAL_COMMUNITY): Payer: Medicaid Other

## 2021-05-06 ENCOUNTER — Other Ambulatory Visit: Payer: Self-pay

## 2021-05-06 DIAGNOSIS — Z96612 Presence of left artificial shoulder joint: Secondary | ICD-10-CM | POA: Insufficient documentation

## 2021-05-06 DIAGNOSIS — Z96611 Presence of right artificial shoulder joint: Secondary | ICD-10-CM | POA: Diagnosis not present

## 2021-05-06 DIAGNOSIS — J45909 Unspecified asthma, uncomplicated: Secondary | ICD-10-CM | POA: Insufficient documentation

## 2021-05-06 DIAGNOSIS — N3000 Acute cystitis without hematuria: Secondary | ICD-10-CM | POA: Diagnosis not present

## 2021-05-06 DIAGNOSIS — Z7901 Long term (current) use of anticoagulants: Secondary | ICD-10-CM | POA: Insufficient documentation

## 2021-05-06 DIAGNOSIS — Z7951 Long term (current) use of inhaled steroids: Secondary | ICD-10-CM | POA: Diagnosis not present

## 2021-05-06 DIAGNOSIS — F17210 Nicotine dependence, cigarettes, uncomplicated: Secondary | ICD-10-CM | POA: Diagnosis not present

## 2021-05-06 DIAGNOSIS — D61818 Other pancytopenia: Secondary | ICD-10-CM | POA: Diagnosis not present

## 2021-05-06 DIAGNOSIS — Z96641 Presence of right artificial hip joint: Secondary | ICD-10-CM | POA: Insufficient documentation

## 2021-05-06 DIAGNOSIS — R1032 Left lower quadrant pain: Secondary | ICD-10-CM | POA: Diagnosis present

## 2021-05-06 DIAGNOSIS — K50118 Crohn's disease of large intestine with other complication: Secondary | ICD-10-CM | POA: Diagnosis not present

## 2021-05-06 DIAGNOSIS — K50119 Crohn's disease of large intestine with unspecified complications: Secondary | ICD-10-CM

## 2021-05-06 LAB — COMPREHENSIVE METABOLIC PANEL
ALT: 43 U/L (ref 0–44)
AST: 49 U/L — ABNORMAL HIGH (ref 15–41)
Albumin: 2.9 g/dL — ABNORMAL LOW (ref 3.5–5.0)
Alkaline Phosphatase: 94 U/L (ref 38–126)
Anion gap: 11 (ref 5–15)
BUN: 7 mg/dL (ref 6–20)
CO2: 24 mmol/L (ref 22–32)
Calcium: 8.9 mg/dL (ref 8.9–10.3)
Chloride: 96 mmol/L — ABNORMAL LOW (ref 98–111)
Creatinine, Ser: 0.71 mg/dL (ref 0.61–1.24)
GFR, Estimated: >60 mL/min (ref >60)
Glucose, Bld: 86 mg/dL (ref 70–99)
Potassium: 5 mmol/L (ref 3.5–5.1)
Sodium: 131 mmol/L — ABNORMAL LOW (ref 135–145)
Total Bilirubin: 1.4 mg/dL — ABNORMAL HIGH (ref 0.3–1.2)
Total Protein: 5.8 g/dL — ABNORMAL LOW (ref 6.5–8.1)

## 2021-05-06 LAB — URINALYSIS, ROUTINE W REFLEX MICROSCOPIC
Bilirubin Urine: NEGATIVE
Glucose, UA: NEGATIVE mg/dL
Hgb urine dipstick: NEGATIVE
Ketones, ur: 5 mg/dL — AB
Nitrite: POSITIVE — AB
Protein, ur: NEGATIVE mg/dL
Specific Gravity, Urine: 1.009 (ref 1.005–1.030)
WBC, UA: >50 WBC/hpf — ABNORMAL HIGH (ref 0–5)
pH: 6 (ref 5.0–8.0)

## 2021-05-06 LAB — DIFFERENTIAL
Abs Immature Granulocytes: 0.01 10*3/uL (ref 0.00–0.07)
Basophils Absolute: 0 10*3/uL (ref 0.0–0.1)
Basophils Relative: 1 %
Eosinophils Absolute: 0 10*3/uL (ref 0.0–0.5)
Eosinophils Relative: 2 %
Immature Granulocytes: 1 %
Lymphocytes Relative: 22 %
Lymphs Abs: 0.4 10*3/uL — ABNORMAL LOW (ref 0.7–4.0)
Monocytes Absolute: 0.2 10*3/uL (ref 0.1–1.0)
Monocytes Relative: 8 %
Neutro Abs: 1.2 10*3/uL — ABNORMAL LOW (ref 1.7–7.7)
Neutrophils Relative %: 66 %

## 2021-05-06 LAB — CBC
HCT: 29.4 % — ABNORMAL LOW (ref 39.0–52.0)
Hemoglobin: 10.2 g/dL — ABNORMAL LOW (ref 13.0–17.0)
MCH: 41.3 pg — ABNORMAL HIGH (ref 26.0–34.0)
MCHC: 34.7 g/dL (ref 30.0–36.0)
MCV: 119 fL — ABNORMAL HIGH (ref 80.0–100.0)
Platelets: 62 10*3/uL — ABNORMAL LOW (ref 150–400)
RBC: 2.47 MIL/uL — ABNORMAL LOW (ref 4.22–5.81)
RDW: 17 % — ABNORMAL HIGH (ref 11.5–15.5)
WBC: 1.7 10*3/uL — ABNORMAL LOW (ref 4.0–10.5)
nRBC: 0 % (ref 0.0–0.2)

## 2021-05-06 LAB — LIPASE, BLOOD: Lipase: 27 U/L (ref 11–51)

## 2021-05-06 MED ORDER — IOHEXOL 300 MG/ML  SOLN
100.0000 mL | Freq: Once | INTRAMUSCULAR | Status: AC | PRN
  Administered 2021-05-06: 100 mL via INTRAVENOUS

## 2021-05-06 MED ORDER — SODIUM CHLORIDE 0.9 % IV SOLN
1.0000 g | Freq: Once | INTRAVENOUS | Status: AC
  Administered 2021-05-06: 1 g via INTRAVENOUS
  Filled 2021-05-06: qty 10

## 2021-05-06 MED ORDER — TRAMADOL HCL 50 MG PO TABS: 50.0000 mg | ORAL_TABLET | Freq: Four times a day (QID) | ORAL | 0 refills | Status: DC | PRN

## 2021-05-06 MED ORDER — CEPHALEXIN 500 MG PO CAPS: 500.0000 mg | ORAL_CAPSULE | Freq: Three times a day (TID) | ORAL | 0 refills | Status: AC

## 2021-05-06 MED ORDER — PREDNISONE 10 MG PO TABS: 30.0000 mg | ORAL_TABLET | Freq: Every day | ORAL | 0 refills | Status: AC

## 2021-05-06 MED ORDER — TRAMADOL HCL 50 MG PO TABS
50.0000 mg | ORAL_TABLET | Freq: Once | ORAL | Status: AC
Start: 2021-05-06 — End: 2021-05-06
  Administered 2021-05-06: 50 mg via ORAL
  Filled 2021-05-06: qty 1

## 2021-05-06 MED ORDER — PREDNISONE 20 MG PO TABS
30.0000 mg | ORAL_TABLET | Freq: Once | ORAL | Status: AC
  Administered 2021-05-06: 30 mg via ORAL
  Filled 2021-05-06: qty 1

## 2021-05-06 NOTE — Discharge Instructions (Addendum)
Please stop taking your methotrexate and mercaptopurine as these medications may be causing your blood counts to be low. Start Prednisone until you see your gastroenterologist. Take the Keflex for your UTI.

## 2021-05-06 NOTE — ED Provider Notes (Signed)
[facility] EMERGENCY DEPARTMENT Provider Note  CSN: [id number] Arrival date & time: 05/06/21 0858    History Chief Complaint  Patient presents with   Abdominal Pain    [name] is a 55 y.o. 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 Today he presents for evaluation of LLQ and spurapubic abdominal pain worsening for the last several days associated with nausea and vomiting, no change in stoma output. Denies dysuria or frequency.    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[place])    right, s/p replacement, due to prednisone   Bipolar disorder ([place])    Colostomy in place [facility])    Crohn's colitis ([place])    s/p total colectomy with ileostomy in 2009   Crohn's disease of small intestine [facility]) Sept 2012   ileoscopy: multiple ulcers likely secondary to [place], HX OF 02/11/2009   Qualifier: Diagnosis of  By: Zeb Comfort     Depression    DVT (deep venous thrombosis) ([place]) 2009   right upper extremity due to PICC   Enterococcus UTI 2009   GERD (gastroesophageal reflux disease)    Hernia    Hyperlipidemia    Insomnia    Migraine headache    Nystagmus    PULMONARY EMBOLISM, HX OF 02/11/2009   Qualifier: Diagnosis of  By: Zeb Comfort     S/P endoscopy Sept 2012   mild gastritis, small hiatal hernia, no ulcers   Schizophrenia ([place])    Schizophrenia, acute ([place])     Past Surgical History:  Procedure Laterality Date   APPENDECTOMY     ESOPHAGOGASTRODUODENOSCOPY  07/2010   gastritis,  bx neg for H.Pylori   ESOPHAGOGASTRODUODENOSCOPY N/A 01/08/2014   SLF:NO SOURCE FOR ODYNOPHAGIA IDENTIFIED/Multiple small ulcers in the gastric antrum   EYE SURGERY     ILEOSCOPY  06/29/2011   SLF: ulcers,multiple/small HH/mild gastritis   KIDNEY SURGERY     LAPAROTOMY N/A 06/05/2013   Procedure:  EXPLORATORY LAPAROTOMY;  Surgeon: Donato Heinz, MD;  Location: [facility] ORS;  Service: General;  Laterality: N/A;   LYSIS OF ADHESION N/A 06/05/2013   Procedure: LYSIS OF ADHESIONS;  Surgeon: Donato Heinz, MD;  Location: [facility] ORS;  Service: General;  Laterality: N/A;   small bowel capsule study  08/2010   few tiny nonbleeding erosions/ulcers ?secondary to relafen or Crohn's. Entire small bowel not seen.    TOTAL COLECTOMY  2009   with ileostomy at [facility] for refractory disease    TOTAL HIP ARTHROPLASTY     right, due to avascular necrosis from chronic prednisone   TOTAL SHOULDER REPLACEMENT     bilateral   TRANSURETHRAL RESECTION OF PROSTATE N/A 12/03/2019   Procedure: TRANSURETHRAL RESECTION OF THE PROSTATE (TURP);  Surgeon: Cleon Gustin, MD;  Location: [facility] ORS;  Service: Urology;  Laterality: N/A;    Family History  Problem Relation Age of Onset   Diabetes Mother    Heart disease Mother    Diabetes Father    Heart disease Father    COPD Maternal Grandmother    Colon cancer Neg Hx     Social History   Tobacco Use   Smoking status: Every Day    Packs/day: 4.00    Years: 37.00    Pack years: 148.00  Types: Cigarettes   Smokeless tobacco: Never   Tobacco comments:    down to 4ppd 11/14/20  Vaping Use   Vaping Use: Never used  Substance Use Topics   Alcohol use: No   Drug use: No     Home Medications Prior to Admission medications   Medication Sig Start Date End Date Taking? Authorizing Provider  cephALEXin (KEFLEX) 500 MG capsule Take 1 capsule (500 mg total) by mouth 3 (three) times daily for 7 days. 05/06/21 05/13/21 Yes Truddie Hidden, MD  predniSONE (DELTASONE) 10 MG tablet Take 3 tablets (30 mg total) by mouth daily for 7 days. 05/06/21 05/13/21 Yes Truddie Hidden, MD  traMADol (ULTRAM) 50 MG tablet Take 1 tablet (50 mg total) by mouth every 6 (six) hours as needed. 05/06/21  Yes Truddie Hidden, MD  ACCU-CHEK AVIVA PLUS test strip SMARTSIG:Via Meter 05/02/20    [provider]  acetaminophen (TYLENOL) 500 MG tablet Take 1,000 mg by mouth every 6 (six) hours as needed. For pain Patient not taking: Reported on 04/22/2021    [provider]  albuterol (PROVENTIL) (2.5 MG/3ML) 0.083% nebulizer solution Take 2.5 mg by nebulization every 6 (six) hours as needed for wheezing or shortness of breath. Patient not taking: Reported on 04/22/2021    [provider]  albuterol (VENTOLIN HFA) 108 (90 Base) MCG/ACT inhaler Inhale 2 puffs into the lungs every 6 (six) hours as needed for wheezing or shortness of breath. Patient not taking: Reported on 04/22/2021    [provider]  atorvastatin (LIPITOR) 80 MG tablet Take 80 mg by mouth daily.    [provider]  azelastine (ASTELIN) 0.1 % nasal spray Place 2 sprays into both nostrils 2 (two) times daily. 05/15/20   [provider]  baclofen (LIORESAL) 10 MG tablet Take 10 mg by mouth daily. 10/04/19   [provider]  benztropine (COGENTIN) 0.5 MG tablet Take 0.5 mg by mouth daily.     [provider]  CALCIUM 600/VITAMIN D3 600-800 MG-UNIT TABS TAKE (1) TABLET BY MOUTH THREE TIMES DAILY AFTER MEALS. 12/20/20   Mahala Menghini, PA-C  Cyanocobalamin (B-12) 1000 MCG CAPS Take by mouth daily.    [provider]  dicyclomine (BENTYL) 10 MG capsule Take 1 capsule (10 mg total) by mouth as needed prior to meals and at bedtime for abdominal cramping. Hold in the setting of constipation. 05/01/21   Erenest Rasher, PA-C  divalproex (DEPAKOTE ER) 250 MG 24 hr tablet Take 250 mg by mouth daily.     [provider]  divalproex (DEPAKOTE) 250 MG DR tablet Take by mouth at bedtime. 11/21/20   [provider]  divalproex (DEPAKOTE) 500 MG 24 hr tablet Take 1,000 mg by mouth every evening.    [provider]  divalproex (DEPAKOTE) 500 MG DR tablet Take 500 mg by mouth 2 (two) times daily. 11/21/20   [provider]  docusate  sodium (COLACE) 100 MG capsule Take 1 capsule (100 mg total) by mouth 2 (two) times daily. Patient taking differently: Take 100 mg by mouth 2 (two) times daily as needed. 02/14/18   Kathie Dike, MD  ELIQUIS 5 MG TABS tablet Take 5 mg by mouth 2 (two) times daily. 07/14/20   [provider]  furosemide (LASIX) 40 MG tablet Take 40 mg by mouth daily.    [provider]  gabapentin (NEURONTIN) 300 MG capsule Take 900 mg by mouth 3 (three) times daily.  [provider]  meloxicam (MOBIC) 7.5 MG tablet Take 7.5 mg by mouth daily. 11/08/19   [provider]  metFORMIN (GLUCOPHAGE-XR) 500 MG 24 hr tablet Take 1,000 mg by mouth 2 (two) times daily. 11/21/20   [provider]  metoprolol succinate (TOPROL-XL) 25 MG 24 hr tablet Take 1 tablet (25 mg total) by mouth daily. Take with or immediately following a meal. 01/26/21   Elouise Munroe, MD  mirtazapine (REMERON) 15 MG tablet Take 15 mg by mouth at bedtime.     [provider]  Misc. Devices (WALKER WHEELS) MISC USE AS DIRECTED 06/06/19   Fields, Marga Melnick, MD  mometasone-formoterol [facility]) 200-5 MCG/ACT AERO Inhale 2 puffs into the lungs in the morning and at bedtime. 01/19/21   Freddi Starr, MD  nitroGLYCERIN (NITROSTAT) 0.4 MG SL tablet Place 1 tablet (0.4 mg total) under the tongue every 5 (five) minutes as needed for chest pain. Patient not taking: Reported on 04/22/2021 01/26/21 04/26/21  Elouise Munroe, MD  Ostomy Supplies (ACTIVE LIFE 1-PC DRAIN [phone]) Pouch MISC  05/21/20   [provider]  pantoprazole (PROTONIX) 40 MG tablet TAKE (1) TABLET BY MOUTH TWICE A DAY BEFORE MEALS. (BREAKFAST AND SUPPER) 08/01/20   Mahala Menghini, PA-C  potassium chloride SA (K-DUR,KLOR-CON) 20 MEQ tablet Take 20 mEq by mouth daily.     [provider]  risperiDONE (RISPERDAL) 1 MG tablet Take 1 tablet by mouth at bedtime. 09/29/18   [provider]  sertraline (ZOLOFT) 50 MG  tablet Take 50 mg by mouth daily.    [provider]  tamsulosin (FLOMAX) 0.4 MG CAPS capsule Take 0.4 mg by mouth daily. 07/03/20   [provider]  Tiotropium Bromide Monohydrate (SPIRIVA RESPIMAT) 2.5 MCG/ACT AERS Inhale 2 puffs into the lungs daily. 01/19/21   Freddi Starr, MD  traZODone (DESYREL) 50 MG tablet Take 50 mg by mouth at bedtime.    [provider]     Allergies    Other, Aspirin, Bactrim [sulfamethoxazole-trimethoprim], Codeine, Ibuprofen, Oxycodone-acetaminophen, Penicillins, Remicade [infliximab], Tramadol, Tramadol hcl, and Vicodin [hydrocodone-acetaminophen]   Review of Systems   Review of Systems A comprehensive review of systems was completed and negative except as noted in HPI.    Physical Exam BP 122/77   Pulse 74   Temp 98.4 F (36.9 C) (Oral)   Resp 15   Ht 5' 6"  (1.676 m)   Wt 88.5 kg   SpO2 100%   BMI 31.47 kg/m   Physical Exam Vitals and nursing note reviewed.  Constitutional:      Appearance: Normal appearance.  HENT:     Head: Normocephalic and atraumatic.     Nose: Nose normal.     Mouth/Throat:     Mouth: Mucous membranes are moist.  Eyes:     Extraocular Movements: Extraocular movements intact.     Conjunctiva/sclera: Conjunctivae normal.  Cardiovascular:     Rate and Rhythm: Normal rate.  Pulmonary:     Effort: Pulmonary effort is normal.     Breath sounds: Normal breath sounds.  Abdominal:     General: Abdomen is flat.     Palpations: Abdomen is soft.     Tenderness: There is abdominal tenderness in the suprapubic area and left lower quadrant. There is guarding. Negative signs include Murphy's sign and McBurney's sign.  Musculoskeletal:        General: No swelling. Normal range of motion.     Cervical back: Neck supple.  Skin:    General: Skin is warm and dry.  Neurological:     General: No focal deficit present.     Mental Status: He is alert.  Psychiatric:        Mood and Affect: Mood  normal.     ED Results / Procedures / Treatments   Labs (all labs ordered are listed, but only abnormal results are displayed) Labs Reviewed  COMPREHENSIVE METABOLIC PANEL - Abnormal; Notable for the following components:      Result Value   Sodium 131 (*)    Chloride 96 (*)    Total Protein 5.8 (*)    Albumin 2.9 (*)    AST 49 (*)    Total Bilirubin 1.4 (*)    All other components within normal limits  CBC - Abnormal; Notable for the following components:   WBC 1.7 (*)    RBC 2.47 (*)    Hemoglobin 10.2 (*)    HCT 29.4 (*)    MCV 119.0 (*)    MCH 41.3 (*)    RDW 17.0 (*)    Platelets 62 (*)    All other components within normal limits  URINALYSIS, ROUTINE W REFLEX MICROSCOPIC - Abnormal; Notable for the following components:   APPearance HAZY (*)    Ketones, ur 5 (*)    Nitrite POSITIVE (*)    Leukocytes,Ua MODERATE (*)    WBC, UA >50 (*)    Bacteria, UA RARE (*)    All other components within normal limits  DIFFERENTIAL - Abnormal; Notable for the following components:   Neutro Abs 1.2 (*)    Lymphs Abs 0.4 (*)    All other components within normal limits  CULTURE, BLOOD (ROUTINE X 2)  CULTURE, BLOOD (ROUTINE X 2)  LIPASE, BLOOD    EKG None  Radiology CT Abdomen Pelvis W Contrast  Result Date: 05/06/2021 CLINICAL DATA:  Left lower quadrant and suprapubic pain, pancytopenic EXAM: CT ABDOMEN AND PELVIS WITH CONTRAST TECHNIQUE: Multidetector CT imaging of the abdomen and pelvis was performed using the standard protocol following bolus administration of intravenous contrast. CONTRAST:  174m OMNIPAQUE IOHEXOL 300 MG/ML  SOLN COMPARISON:  CT abdomen and pelvis dated March 19, 2021 FINDINGS: Lower chest: No acute abnormality. Hepatobiliary: No focal liver abnormality is seen. No gallstones, gallbladder wall thickening, or biliary dilatation. Pancreas: Unremarkable. No pancreatic ductal dilatation or surrounding inflammatory changes. Spleen: Normal in size without focal  abnormality. Adrenals/Urinary Tract: Kidneys enhance symmetrically with no evidence of hydronephrosis. Bilateral adrenal glands are unremarkable. Bladder is unremarkable. Stomach/Bowel: Normal stomach. 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 Wall thickening of a small bowel loop located in the mid/left abdomen with associated engorgement of the Vasa recta and surrounding inflammatory change. No evidence of stricture or bowel obstruction. No intra-abdominal fluid collections. Vascular/Lymphatic: Aortic atherosclerosis. No enlarged abdominal or pelvic lymph nodes. Reproductive: Postsurgical changes of prior TURP. Other: No abdominal wall hernia or abnormality. No abdominopelvic ascites. Musculoskeletal: Prior right total hip arthroplasty. No aggressive appearing osseous lesions. IMPRESSION: Findings compatible with Crohn's enteritis of a small bowel loop located in the mid/left abdomen. No evidence of obstruction or intra-abdominal fluid collection. Electronically Signed   By: LYetta GlassmanMD   On: 05/06/2021 14:50    Procedures Procedures  Medications Ordered in the ED Medications  cefTRIAXone (ROCEPHIN) 1 g in sodium chloride 0.9 % 100 mL IVPB (0 g Intravenous Stopped 05/06/21 1455)  iohexol (OMNIPAQUE) 300 MG/ML solution 100 mL (100 mLs Intravenous Contrast Given  05/06/21 1355)  predniSONE (DELTASONE) tablet 30 mg (30 mg Oral Given 05/06/21 1527)  traMADol (ULTRAM) tablet 50 mg (50 mg Oral Given 05/06/21 1527)     MDM Rules/Calculators/A&P MDM Patient with worsening pancytopenia on CBC, not particularly neutropenic though and no fever. UA is concerning for UTI, will check cultures and begin Rocephin. Send for CT to eval other intraabdominal cause of his pain.   ED Course  I have reviewed the triage vital signs and the nursing notes.  Pertinent labs & imaging results that were available during my care of the patient were reviewed by me and considered in my medical decision making  (see chart for details).  Clinical Course as of 05/06/21 1608  Wed May 06, 2021  1332 Spoke with Dr. Delton Coombes, Hem/Onc who does not feel he needs to be admitted for the pancytopenia alone but does recommend he stop his methotrexate and mercaptopurine if he is able to go home otherwise. Follow up in their office in a week if so.  [CS]  1508 Reviewed CT images and discussed results with Dr. Laural Golden, on call for GI. He agrees with stopping MTX and 6-MP as this is likely causing his pancytopenia. He recommends starting Prednisone 74m daily until he can follow up with GI for a longer taper. Keflex for UTI in the meantime. Pain meds as needed. Patient amenable to discharge and will return if his pain worsening, begins running a fever, has any blood in stool or any other concerns.  [CS]  [id number] states he can take Tramadol, just makes him nauseated.  [CS]    Clinical Course User Index [CS] STruddie Hidden MD    Final Clinical Impression(s) / ED Diagnoses Final diagnoses:  Crohn's disease of colon with complication ([place]  Acute cystitis without hematuria  Pancytopenia ([place]    Rx / DC Orders ED Discharge Orders          Ordered    cephALEXin (KEFLEX) 500 MG capsule  3 times daily        05/06/21 1516    traMADol (ULTRAM) 50 MG tablet  Every 6 hours PRN        05/06/21 1516    predniSONE (DELTASONE) 10 MG tablet  Daily        05/06/21 1516             STruddie Hidden MD 05/06/21 [phone]

## 2021-05-06 NOTE — ED Triage Notes (Signed)
Pt c/o generalized abd pain since last week.  LBM was this am and was normal per pt.  Reports n/v x 3 days.

## 2021-05-06 NOTE — ED Triage Notes (Signed)
Ems reports pt c/o mid abd pain since last Friday.  Reports n/v.  EMS says bp 112/60, HR 80.

## 2021-05-06 NOTE — ED Notes (Signed)
Iv attempted in left upper forearm without success, IV team at bedside,

## 2021-05-07 ENCOUNTER — Telehealth (HOSPITAL_BASED_OUTPATIENT_CLINIC_OR_DEPARTMENT_OTHER): Payer: Self-pay | Admitting: Emergency Medicine

## 2021-05-07 ENCOUNTER — Telehealth (HOSPITAL_COMMUNITY): Payer: Self-pay | Admitting: *Deleted

## 2021-05-07 LAB — BLOOD CULTURE ID PANEL (REFLEXED) - BCID2

## 2021-05-07 NOTE — Telephone Encounter (Signed)
Patient notified of positive blood cultures and advised, per Dr Delton Coombes to report back to the ER if symptomatic, fever.  Verbalized understanding.

## 2021-05-08 NOTE — Progress Notes (Signed)
[name], [name] [id number]   CLINIC:  Medical Oncology/Hematology  PCP:  [name] Schwalbe, MD [address] [place] [address] [phone]   REASON FOR VISIT:  Follow-up for pancytopenia, recurrent DVTs  CURRENT THERAPY: - Recurrent DVTs: Eliquis - Pancytopenia: Under work-up  INTERVAL HISTORY:  [name] 55 y.o. male returns for routine follow-up of pancytopenia and recurrent DVTs.  He was last seen by [name] on 04/22/2021.  At his last visit, he was noted to have pancytopenia, and work-up was initiated.  He returns today to discuss results of these tests.  At today's visit, he reports feeling fair.  No recent hospitalizations, surgeries, or changes in baseline health status.  He continues to take Eliquis and reports easy bruising.  He denies any abnormal blood loss such as bright red blood per rectum or melena.  He reports that his left lower extremity edema and pain has improved since his last visit.  He continues to have fatigue and night sweats, but denies other B symptoms such as fever and chills.  He reports that his appetite has improved and that he has gained some weight from his last visit.  He has 40% energy and 40% appetite. He endorses that he is maintaining a stable weight.   REVIEW OF SYSTEMS:  Review of Systems  Constitutional:  Positive for diaphoresis and fatigue. Negative for appetite change, chills, fever and unexpected weight change.  HENT:   Negative for lump/mass and nosebleeds.   Eyes:  Negative for eye problems.  Respiratory:  Negative for cough, hemoptysis and shortness of breath.   Cardiovascular:  Negative for chest pain, leg swelling and palpitations.  Gastrointestinal:  Positive for nausea and vomiting. Negative for abdominal pain, blood in stool, constipation and diarrhea.  Genitourinary:  Negative for hematuria.   Skin: Negative.   Neurological:  Negative for dizziness, headaches  and light-headedness.  Hematological:  Does not bruise/bleed easily.     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[place])    right, s/p replacement, due to prednisone   Bipolar disorder ([place])    Colostomy in place [facility])    Crohn's colitis ([place])    s/p total colectomy with ileostomy in 2009   Crohn's disease of small intestine [facility]) Sept 2012   ileoscopy: multiple ulcers likely secondary to [place], HX OF 02/11/2009   Qualifier: Diagnosis of  By: [name]     Depression    DVT (deep venous thrombosis) ([place]) 2009   right upper extremity due to PICC   Enterococcus UTI 2009   GERD (gastroesophageal reflux disease)    Hernia    Hyperlipidemia    Insomnia    Migraine headache    Nystagmus    PULMONARY EMBOLISM, HX OF 02/11/2009   Qualifier: Diagnosis of  By: [name]     S/P endoscopy Sept 2012   mild gastritis, small hiatal hernia, no ulcers   Schizophrenia ([place])    Schizophrenia, acute ([place])    Past Surgical History:  Procedure Laterality Date   APPENDECTOMY     ESOPHAGOGASTRODUODENOSCOPY  07/2010   gastritis,  bx neg for H.Pylori   ESOPHAGOGASTRODUODENOSCOPY N/A 01/08/2014   SLF:NO SOURCE FOR ODYNOPHAGIA IDENTIFIED/Multiple small ulcers in the gastric antrum   EYE SURGERY     ILEOSCOPY  06/29/2011  SLF: ulcers,multiple/small HH/mild gastritis   KIDNEY SURGERY     LAPAROTOMY N/A 06/05/2013   Procedure: EXPLORATORY LAPAROTOMY;  Surgeon: Donato Heinz, MD;  Location: [facility];  Service: General;  Laterality: N/A;   LYSIS OF ADHESION N/A 06/05/2013   Procedure: LYSIS OF ADHESIONS;  Surgeon: Donato Heinz, MD;  Location: [facility];  Service: General;  Laterality: N/A;   small bowel capsule study  08/2010   few tiny nonbleeding erosions/ulcers ?secondary to relafen or Crohn's. Entire small bowel not seen.    TOTAL COLECTOMY  2009   with ileostomy at  [facility] for refractory disease    TOTAL HIP ARTHROPLASTY     right, due to avascular necrosis from chronic prednisone   TOTAL SHOULDER REPLACEMENT     bilateral   TRANSURETHRAL RESECTION OF PROSTATE N/A 12/03/2019   Procedure: TRANSURETHRAL RESECTION OF THE PROSTATE (TURP);  Surgeon: Cleon Gustin, MD;  Location: [facility];  Service: Urology;  Laterality: N/A;     SOCIAL HISTORY:  Social History   Socioeconomic History   Marital status: Single    Spouse name: Not on file   Number of children: Not on file   Years of education: Not on file   Highest education level: Not on file  Occupational History   Not on file  Tobacco Use   Smoking status: Every Day    Packs/day: 4.00    Years: 37.00    Pack years: 148.00    Types: Cigarettes   Smokeless tobacco: Never   Tobacco comments:    down to 4ppd 11/14/20  Vaping Use   Vaping Use: Never used  Substance and Sexual Activity   Alcohol use: No   Drug use: No   Sexual activity: Never  Other Topics Concern   Not on file  Social History Narrative   Girlfriend of six years, Rectal Cancer on Hospice (05/2011).   Social Determinants of Health   Financial Resource Strain: Not on file  Food Insecurity: Not on file  Transportation Needs: No Transportation Needs   Lack of Transportation (Medical): No   Lack of Transportation (Non-Medical): No  Physical Activity: Not on file  Stress: Not on file  Social Connections: Not on file  Intimate Partner Violence: Not At Risk   Fear of Current or Ex-Partner: No   Emotionally Abused: No   Physically Abused: No   Sexually Abused: No    FAMILY HISTORY:  Family History  Problem Relation Age of Onset   Diabetes Mother    Heart disease Mother    Diabetes Father    Heart disease Father    COPD Maternal Grandmother    Colon cancer Neg Hx     CURRENT MEDICATIONS:  Outpatient Encounter Medications as of 05/11/2021  Medication Sig   ACCU-CHEK AVIVA PLUS test strip SMARTSIG:Via Meter    acetaminophen (TYLENOL) 500 MG tablet Take 1,000 mg by mouth every 6 (six) hours as needed. For pain (Patient not taking: Reported on 04/22/2021)   albuterol (PROVENTIL) (2.5 MG/3ML) 0.083% nebulizer solution Take 2.5 mg by nebulization every 6 (six) hours as needed for wheezing or shortness of breath. (Patient not taking: Reported on 04/22/2021)   albuterol (VENTOLIN HFA) 108 (90 Base) MCG/ACT inhaler Inhale 2 puffs into the lungs every 6 (six) hours as needed for wheezing or shortness of breath. (Patient not taking: Reported on 04/22/2021)   atorvastatin (LIPITOR) 80 MG tablet Take 80 mg by mouth daily.   azelastine (ASTELIN) 0.1 % nasal spray Place 2  sprays into both nostrils 2 (two) times daily.   baclofen (LIORESAL) 10 MG tablet Take 10 mg by mouth daily.   benztropine (COGENTIN) 0.5 MG tablet Take 0.5 mg by mouth daily.    CALCIUM 600/VITAMIN D3 600-800 MG-UNIT TABS TAKE (1) TABLET BY MOUTH THREE TIMES DAILY AFTER MEALS.   cephALEXin (KEFLEX) 500 MG capsule Take 1 capsule (500 mg total) by mouth 3 (three) times daily for 7 days.   Cyanocobalamin (B-12) 1000 MCG CAPS Take by mouth daily.   dicyclomine (BENTYL) 10 MG capsule Take 1 capsule (10 mg total) by mouth as needed prior to meals and at bedtime for abdominal cramping. Hold in the setting of constipation.   divalproex (DEPAKOTE ER) 250 MG 24 hr tablet Take 250 mg by mouth daily.    divalproex (DEPAKOTE) 250 MG DR tablet Take by mouth at bedtime.   divalproex (DEPAKOTE) 500 MG 24 hr tablet Take 1,000 mg by mouth every evening.   divalproex (DEPAKOTE) 500 MG DR tablet Take 500 mg by mouth 2 (two) times daily.   docusate sodium (COLACE) 100 MG capsule Take 1 capsule (100 mg total) by mouth 2 (two) times daily. (Patient taking differently: Take 100 mg by mouth 2 (two) times daily as needed.)   ELIQUIS 5 MG TABS tablet Take 5 mg by mouth 2 (two) times daily.   furosemide (LASIX) 40 MG tablet Take 40 mg by mouth daily.   gabapentin (NEURONTIN) 300  MG capsule Take 900 mg by mouth 3 (three) times daily.   meloxicam (MOBIC) 7.5 MG tablet Take 7.5 mg by mouth daily.   metFORMIN (GLUCOPHAGE-XR) 500 MG 24 hr tablet Take 1,000 mg by mouth 2 (two) times daily.   metoprolol succinate (TOPROL-XL) 25 MG 24 hr tablet Take 1 tablet (25 mg total) by mouth daily. Take with or immediately following a meal.   mirtazapine (REMERON) 15 MG tablet Take 15 mg by mouth at bedtime.    Misc. Devices (WALKER WHEELS) MISC USE AS DIRECTED   mometasone-formoterol (DULERA) 200-5 MCG/ACT AERO Inhale 2 puffs into the lungs in the morning and at bedtime.   nitroGLYCERIN (NITROSTAT) 0.4 MG SL tablet Place 1 tablet (0.4 mg total) under the tongue every 5 (five) minutes as needed for chest pain. (Patient not taking: Reported on 04/22/2021)   Ostomy Supplies (ACTIVE LIFE 1-PC DRAIN [phone]) Pouch MISC    pantoprazole (PROTONIX) 40 MG tablet TAKE (1) TABLET BY MOUTH TWICE A DAY BEFORE MEALS. (BREAKFAST AND SUPPER)   potassium chloride SA (K-DUR,KLOR-CON) 20 MEQ tablet Take 20 mEq by mouth daily.    predniSONE (DELTASONE) 10 MG tablet Take 3 tablets (30 mg total) by mouth daily for 7 days.   risperiDONE (RISPERDAL) 1 MG tablet Take 1 tablet by mouth at bedtime.   sertraline (ZOLOFT) 50 MG tablet Take 50 mg by mouth daily.   tamsulosin (FLOMAX) 0.4 MG CAPS capsule Take 0.4 mg by mouth daily.   Tiotropium Bromide Monohydrate (SPIRIVA RESPIMAT) 2.5 MCG/ACT AERS Inhale 2 puffs into the lungs daily.   traMADol (ULTRAM) 50 MG tablet Take 1 tablet (50 mg total) by mouth every 6 (six) hours as needed.   traZODone (DESYREL) 50 MG tablet Take 50 mg by mouth at bedtime.   No facility-administered encounter medications on file as of 05/11/2021.    ALLERGIES:  Allergies  Allergen Reactions   Other Shortness Of Breath    Other reaction(s): Shortness Of Breath Throat swells Throat swells   Aspirin     REACTION:  unknown reaction   Bactrim [Sulfamethoxazole-Trimethoprim]     ABD CRAMPS  AND DIARRHEA   Codeine     REACTION: unknown reaction   Ibuprofen     REACTION: unknown reaction   Oxycodone-Acetaminophen Other (See Comments)    Other reaction(s): Swelling   Penicillins     REACTION: unknown reaction   Remicade [Infliximab]     COULDN'T BREATHE   Tramadol Nausea Only    Other reaction(s): Nausea   Tramadol Hcl     REACTION: unknown reaction   Vicodin [Hydrocodone-Acetaminophen] Nausea Only     PHYSICAL EXAM:  ECOG PERFORMANCE STATUS: 1 - Symptomatic but completely ambulatory  There were no vitals filed for this visit. There were no vitals filed for this visit. Physical Exam Constitutional:      Appearance: Normal appearance. He is obese.  HENT:     Head: Normocephalic and atraumatic.     Mouth/Throat:     Mouth: Mucous membranes are moist.  Eyes:     Extraocular Movements: Extraocular movements intact.     Right eye: Nystagmus present.     Left eye: Nystagmus present.     Pupils: Pupils are equal, round, and reactive to light.  Cardiovascular:     Rate and Rhythm: Normal rate and regular rhythm.     Pulses: Normal pulses.     Heart sounds: Normal heart sounds.  Pulmonary:     Effort: Pulmonary effort is normal.     Breath sounds: Wheezing (faint) present.     Comments: Dry cough Abdominal:     General: Bowel sounds are normal.     Palpations: Abdomen is soft.     Tenderness: There is no abdominal tenderness.  Musculoskeletal:        General: No swelling.     Right lower leg: No edema.     Left lower leg: Edema (trace) present.  Lymphadenopathy:     Cervical: No cervical adenopathy.  Skin:    General: Skin is warm and dry.  Neurological:     General: No focal deficit present.     Mental Status: He is alert and oriented to person, place, and time.  Psychiatric:        Mood and Affect: Mood normal.        Behavior: Behavior normal.     LABORATORY DATA:  I have reviewed the labs as listed.  CBC    Component Value Date/Time   WBC  1.7 (L) 05/06/2021 1128   RBC 2.47 (L) 05/06/2021 1128   HGB 10.2 (L) 05/06/2021 1128   HCT 29.4 (L) 05/06/2021 1128   HCT 42 05/24/2011 1417   PLT 62 (L) 05/06/2021 1128   MCV 119.0 (H) 05/06/2021 1128   MCH 41.3 (H) 05/06/2021 1128   MCHC 34.7 05/06/2021 1128   RDW 17.0 (H) 05/06/2021 1128   LYMPHSABS 0.4 (L) 05/06/2021 1128   MONOABS 0.2 05/06/2021 1128   EOSABS 0.0 05/06/2021 1128   BASOSABS 0.0 05/06/2021 1128   CMP Latest Ref Rng & Units 05/06/2021 04/22/2021 03/17/2021  Glucose 70 - 99 mg/dL 86 81 78  BUN 6 - 20 mg/dL 7 13 6(L)  Creatinine 0.61 - 1.24 mg/dL 0.71 0.82 0.65(L)  Sodium 135 - 145 mmol/L 131(L) 133(L) 131(L)  Potassium 3.5 - 5.1 mmol/L 5.0 4.5 4.6  Chloride 98 - 111 mmol/L 96(L) 97(L) 96(L)  CO2 22 - 32 mmol/L _0 Calcium 8.9 - 10.3 mg/dL 8.9 8.7(L) 8.6  Total Protein 6.5 - 8.1 g/dL  5.8(L) 6.2(L) 5.8(L)  Total Bilirubin 0.3 - 1.2 mg/dL 1.4(H) 1.1 1.3(H)  Alkaline Phos 38 - 126 U/L 94 98 -  AST 15 - 41 U/L 49(H) 47(H) 39(H)  ALT 0 - 44 U/L 43 47(H) 33    DIAGNOSTIC IMAGING:  I have independently reviewed the relevant imaging and discussed with the patient.  ASSESSMENT & PLAN: 1.  Pancytopenia with macrocytic anemia - Labs obtained at external lab [facility]) on 04/07/2021 show new pancytopenia with WBC 2.3 (ANC 1.4, lymphocytes 0.6), platelets 113, Hgb 11.4 with MCV 111.0 - Patient reports fatigue, night sweats for the past weeks - Denies fever, chills, frequent infections  - Nutritional panel (04/22/2021) was unremarkable: Normal methylmalonic acid/B12, copper, iron, folate - MGUS panel (04/22/2021): Immunofixation and SPEP were negative for monoclonal gammopathy, light chains unremarkable (mildly elevated kappa 30.3, but normal lambda and normal ratio); normal LDH - Pathology smear review (04/22/2021): Macrocytic anemia with polychromasia, occasional teardrop and RBC fragments, leukopenia with neutropenia - Patient presented to ED on 05/06/2021 and was  noted to have worsening pancytopenia, therefore mercaptopurine and methotrexate were discontinued (ED provider discussed with on-call GI as well, who agreed) - Patient reports that he receives all of his medications presorted into their daily doses in a "bubble pack," and that these medications have not yet been taken out of his pack, he does not know which pills are which, so he has been continuing to take them at home - Repeat CBC (05/11/2021): WBC 2.6 with ANC 1.4, hemoglobin 9.8 with MCV 119.3, platelets 83 - Differential diagnosis favors pancytopenia secondary to medication effect (mercaptopurine and methotrexate), but if blood counts do not improve after these medications were stopped, may need to consider bone marrow biopsy - PLAN: We will call patient's pharmacy to make sure that methotrexate and mercaptopurine are removed from his weekly "bubble pack."  RTC in 6 weeks for follow-up of blood counts.  2.  Unintentional weight loss -Patient reports poor appetite and about a 10 pound unintentional weight loss since November 2021 - Referral sent to dietitian - Weight today is improved - PLAN: We will continue to monitor at follow-up visit.  If continued significant weight loss, would consider imaging studies if indicated.  3.   Recurrent DVTs with history of PE - Initial VTE events in 2009, around the same time the patient had colostomy placed - History of pulmonary embolism with CT angio on 01/31/2008 which was positive for PE and bilateral lower lobe pulmonary infarcts, likely source suspected to be left subclavian Port-A-Cath which appeared to have contiguous embolus extending from the entry site into the left subclavian vein into the SVC - Venous ultrasound imaging (01/31/2008): Extensive deep vein thrombosis of left upper extremity extending into the jugular vein - Hypercoagulable panel was performed in 2009, which showed normal protein S, was negative for factor V Leiden gene mutation - Left  lower extremity venous duplex (06/05/2020): Probable superficial thrombophlebitis in the left superficial greater saphenous vein - Patient reported that symptoms had worsened, and a repeat venous duplex performed in person [place] on 06/19/2020 showed positive DVT involving common left femoral, profundofemoral, and superficial femoral veins - he was started on Eliquis at this time - Most recent venous duplex of left lower extremity (12/02/2020): Nonocclusive thrombus within the common femoral, profundofemoral, and superficial femoral veins on the left, felt to represent chronic recanalized thrombus - Patient has family history of unprovoked VTE in several family members on his mother side - Further hypercoagulable work-up has  been deferred, as it would not change the patient's need for lifelong anticoagulation at this point - Patient currently on Eliquis 5 mg twice daily (started after unprovoked DVT in September 2021), has previously been on warfarin (for treatment of upper extremity DVT/PE in 2009) - Most recent D-dimer (04/15/2021): 0.31 - PLAN: Continue Eliquis (lifelong anticoagulation)  4.  Other history - Patient has extensive other conditions including Crohn's disease, bipolar affective disorder, asthma, and other conditions noted elsewhere in medical record   PLAN SUMMARY & DISPOSITION: -Repeat CBC and RTC in 6 weeks  All questions were answered. The patient knows to call the clinic with any problems, questions or concerns.  Medical decision making: Moderate  Time spent on visit: I spent 20 minutes counseling the patient face to face. The total time spent in the appointment was 30 minutes and more than 50% was on counseling.   Harriett Rush, Lane  05/11/2021 4:53 PM

## 2021-05-09 LAB — CULTURE, BLOOD (ROUTINE X 2)

## 2021-05-11 ENCOUNTER — Other Ambulatory Visit: Payer: Self-pay

## 2021-05-11 ENCOUNTER — Inpatient Hospital Stay (HOSPITAL_BASED_OUTPATIENT_CLINIC_OR_DEPARTMENT_OTHER): Payer: Medicaid Other | Admitting: Physician Assistant

## 2021-05-11 ENCOUNTER — Inpatient Hospital Stay (HOSPITAL_COMMUNITY): Payer: Medicaid Other | Attending: Physician Assistant

## 2021-05-11 VITALS — BP 101/50 | HR 66 | Temp 96.7°F | Resp 17 | Wt 205.2 lb

## 2021-05-11 DIAGNOSIS — Z86718 Personal history of other venous thrombosis and embolism: Secondary | ICD-10-CM | POA: Diagnosis not present

## 2021-05-11 DIAGNOSIS — Z79899 Other long term (current) drug therapy: Secondary | ICD-10-CM | POA: Insufficient documentation

## 2021-05-11 DIAGNOSIS — R112 Nausea with vomiting, unspecified: Secondary | ICD-10-CM | POA: Diagnosis not present

## 2021-05-11 DIAGNOSIS — D539 Nutritional anemia, unspecified: Secondary | ICD-10-CM | POA: Diagnosis not present

## 2021-05-11 DIAGNOSIS — Z886 Allergy status to analgesic agent status: Secondary | ICD-10-CM | POA: Insufficient documentation

## 2021-05-11 DIAGNOSIS — I82409 Acute embolism and thrombosis of unspecified deep veins of unspecified lower extremity: Secondary | ICD-10-CM

## 2021-05-11 DIAGNOSIS — Z836 Family history of other diseases of the respiratory system: Secondary | ICD-10-CM | POA: Insufficient documentation

## 2021-05-11 DIAGNOSIS — R600 Localized edema: Secondary | ICD-10-CM | POA: Diagnosis not present

## 2021-05-11 DIAGNOSIS — D61818 Other pancytopenia: Secondary | ICD-10-CM | POA: Diagnosis present

## 2021-05-11 DIAGNOSIS — K5090 Crohn's disease, unspecified, without complications: Secondary | ICD-10-CM | POA: Diagnosis not present

## 2021-05-11 DIAGNOSIS — Z8249 Family history of ischemic heart disease and other diseases of the circulatory system: Secondary | ICD-10-CM | POA: Insufficient documentation

## 2021-05-11 DIAGNOSIS — Z881 Allergy status to other antibiotic agents status: Secondary | ICD-10-CM | POA: Insufficient documentation

## 2021-05-11 DIAGNOSIS — Z880 Allergy status to penicillin: Secondary | ICD-10-CM | POA: Insufficient documentation

## 2021-05-11 DIAGNOSIS — Z86711 Personal history of pulmonary embolism: Secondary | ICD-10-CM | POA: Insufficient documentation

## 2021-05-11 DIAGNOSIS — K219 Gastro-esophageal reflux disease without esophagitis: Secondary | ICD-10-CM | POA: Diagnosis not present

## 2021-05-11 DIAGNOSIS — Z885 Allergy status to narcotic agent status: Secondary | ICD-10-CM | POA: Insufficient documentation

## 2021-05-11 DIAGNOSIS — R634 Abnormal weight loss: Secondary | ICD-10-CM | POA: Diagnosis not present

## 2021-05-11 DIAGNOSIS — Z87440 Personal history of urinary (tract) infections: Secondary | ICD-10-CM | POA: Diagnosis not present

## 2021-05-11 DIAGNOSIS — F17210 Nicotine dependence, cigarettes, uncomplicated: Secondary | ICD-10-CM | POA: Insufficient documentation

## 2021-05-11 DIAGNOSIS — Z888 Allergy status to other drugs, medicaments and biological substances status: Secondary | ICD-10-CM | POA: Diagnosis not present

## 2021-05-11 DIAGNOSIS — Z7901 Long term (current) use of anticoagulants: Secondary | ICD-10-CM | POA: Insufficient documentation

## 2021-05-11 DIAGNOSIS — E785 Hyperlipidemia, unspecified: Secondary | ICD-10-CM | POA: Diagnosis not present

## 2021-05-11 DIAGNOSIS — Z833 Family history of diabetes mellitus: Secondary | ICD-10-CM | POA: Insufficient documentation

## 2021-05-11 DIAGNOSIS — Z933 Colostomy status: Secondary | ICD-10-CM | POA: Insufficient documentation

## 2021-05-11 DIAGNOSIS — Z8719 Personal history of other diseases of the digestive system: Secondary | ICD-10-CM | POA: Diagnosis not present

## 2021-05-11 DIAGNOSIS — R61 Generalized hyperhidrosis: Secondary | ICD-10-CM | POA: Insufficient documentation

## 2021-05-11 DIAGNOSIS — Z9079 Acquired absence of other genital organ(s): Secondary | ICD-10-CM | POA: Diagnosis not present

## 2021-05-11 DIAGNOSIS — R5383 Other fatigue: Secondary | ICD-10-CM | POA: Diagnosis not present

## 2021-05-11 DIAGNOSIS — Z932 Ileostomy status: Secondary | ICD-10-CM | POA: Insufficient documentation

## 2021-05-11 DIAGNOSIS — Z9049 Acquired absence of other specified parts of digestive tract: Secondary | ICD-10-CM | POA: Insufficient documentation

## 2021-05-11 LAB — CBC WITH DIFFERENTIAL/PLATELET
Abs Immature Granulocytes: 0.02 10*3/uL (ref 0.00–0.07)
Basophils Absolute: 0 10*3/uL (ref 0.0–0.1)
Basophils Relative: 0 %
Eosinophils Absolute: 0.1 10*3/uL (ref 0.0–0.5)
Eosinophils Relative: 2 %
HCT: 28.4 % — ABNORMAL LOW (ref 39.0–52.0)
Hemoglobin: 9.8 g/dL — ABNORMAL LOW (ref 13.0–17.0)
Immature Granulocytes: 1 %
Lymphocytes Relative: 33 %
Lymphs Abs: 0.9 10*3/uL (ref 0.7–4.0)
MCH: 41.2 pg — ABNORMAL HIGH (ref 26.0–34.0)
MCHC: 34.5 g/dL (ref 30.0–36.0)
MCV: 119.3 fL — ABNORMAL HIGH (ref 80.0–100.0)
Monocytes Absolute: 0.2 10*3/uL (ref 0.1–1.0)
Monocytes Relative: 8 %
Neutro Abs: 1.4 10*3/uL — ABNORMAL LOW (ref 1.7–7.7)
Neutrophils Relative %: 56 %
Platelets: 83 10*3/uL — ABNORMAL LOW (ref 150–400)
RBC: 2.38 MIL/uL — ABNORMAL LOW (ref 4.22–5.81)
RDW: 17.6 % — ABNORMAL HIGH (ref 11.5–15.5)
WBC: 2.6 10*3/uL — ABNORMAL LOW (ref 4.0–10.5)
nRBC: 0 % (ref 0.0–0.2)

## 2021-05-11 NOTE — Patient Instructions (Signed)
[place] at [facility] Discharge Instructions  You were seen today by Tarri Abernethy PA-C for your low blood counts.  We would like you to STOP taking your methotrexate and mercaptopurine.  We will call the pharmacy to make sure that it is no longer included in your bubble pack of weekly medications.  We will see you for follow-up in 6 weeks, to see if your blood counts have improved after stopping these medications.  Make sure that you continue to follow-up with your gastroenterologist and primary care doctor for ongoing management of your other conditions.  LABS: Return in 6 weeks for repeat labs  OTHER TESTS: None  MEDICATIONS: STOP methotrexate and mercaptopurine  FOLLOW-UP APPOINTMENT: Office visit in 6 weeks   Thank you for choosing [place] at [facility] to provide your oncology and hematology care.  To afford each patient quality time with our provider, please arrive at least 15 minutes before your scheduled appointment time.   If you have a lab appointment with the [facility] please come in thru the Main Entrance and check in at the main information desk.  You need to re-schedule your appointment should you arrive 10 or more minutes late.  We strive to give you quality time with our providers, and arriving late affects you and other patients whose appointments are after yours.  Also, if you no show three or more times for appointments you may be dismissed from the clinic at the providers discretion.     Again, thank you for choosing [facility].  Our hope is that these requests will decrease the amount of time that you wait before being seen by our physicians.       _____________________________________________________________  Should you have questions after your visit to [facility], please contact our office at [phone] and follow the prompts.  Our office hours are 8:00 a.m. and 4:30 p.m.  Monday - Friday.  Please note that voicemails left after 4:00 p.m. may not be returned until the following business day.  We are closed weekends and major holidays.  You do have access to a nurse 24-7, just call the main number to the clinic [phone] and do not press any options, hold on the line and a nurse will answer the phone.    For prescription refill requests, have your pharmacy contact our office and allow 72 hours.    Due to Covid, you will need to wear a mask upon entering the hospital. If you do not have a mask, a mask will be given to you at the Main Entrance upon arrival. For doctor visits, patients may have 1 support person age 77 or older with them. For treatment visits, patients can not have anyone with them due to social distancing guidelines and our immunocompromised population.

## 2021-05-12 ENCOUNTER — Other Ambulatory Visit: Payer: Self-pay | Admitting: Internal Medicine

## 2021-05-12 LAB — CULTURE, BLOOD (ROUTINE X 2): Culture: NO GROWTH

## 2021-05-16 ENCOUNTER — Other Ambulatory Visit: Payer: Self-pay

## 2021-05-16 ENCOUNTER — Encounter (HOSPITAL_COMMUNITY): Payer: Self-pay

## 2021-05-16 ENCOUNTER — Emergency Department (HOSPITAL_COMMUNITY): Payer: Medicaid Other

## 2021-05-16 ENCOUNTER — Inpatient Hospital Stay (HOSPITAL_COMMUNITY)
Admission: EM | Admit: 2021-05-16 | Discharge: 2021-05-20 | DRG: 871 | Disposition: A | Discharge status: Home / Self Care | Payer: Medicaid Other | Attending: Family Medicine | Admitting: Family Medicine

## 2021-05-16 DIAGNOSIS — Z79899 Other long term (current) drug therapy: Secondary | ICD-10-CM

## 2021-05-16 DIAGNOSIS — N401 Enlarged prostate with lower urinary tract symptoms: Secondary | ICD-10-CM

## 2021-05-16 DIAGNOSIS — E871 Hypo-osmolality and hyponatremia: Secondary | ICD-10-CM | POA: Diagnosis present

## 2021-05-16 DIAGNOSIS — K50819 Crohn's disease of both small and large intestine with unspecified complications: Secondary | ICD-10-CM | POA: Diagnosis not present

## 2021-05-16 DIAGNOSIS — F319 Bipolar disorder, unspecified: Secondary | ICD-10-CM | POA: Diagnosis present

## 2021-05-16 DIAGNOSIS — K5080 Crohn's disease of both small and large intestine without complications: Secondary | ICD-10-CM | POA: Diagnosis present

## 2021-05-16 DIAGNOSIS — Z86711 Personal history of pulmonary embolism: Secondary | ICD-10-CM

## 2021-05-16 DIAGNOSIS — I82409 Acute embolism and thrombosis of unspecified deep veins of unspecified lower extremity: Secondary | ICD-10-CM | POA: Diagnosis not present

## 2021-05-16 DIAGNOSIS — Z9049 Acquired absence of other specified parts of digestive tract: Secondary | ICD-10-CM | POA: Diagnosis not present

## 2021-05-16 DIAGNOSIS — F17210 Nicotine dependence, cigarettes, uncomplicated: Secondary | ICD-10-CM | POA: Diagnosis present

## 2021-05-16 DIAGNOSIS — J45909 Unspecified asthma, uncomplicated: Secondary | ICD-10-CM | POA: Diagnosis present

## 2021-05-16 DIAGNOSIS — Z8249 Family history of ischemic heart disease and other diseases of the circulatory system: Secondary | ICD-10-CM | POA: Diagnosis not present

## 2021-05-16 DIAGNOSIS — G9341 Metabolic encephalopathy: Secondary | ICD-10-CM | POA: Diagnosis present

## 2021-05-16 DIAGNOSIS — Z6833 Body mass index (BMI) 33.0-33.9, adult: Secondary | ICD-10-CM

## 2021-05-16 DIAGNOSIS — R4182 Altered mental status, unspecified: Secondary | ICD-10-CM

## 2021-05-16 DIAGNOSIS — Z7951 Long term (current) use of inhaled steroids: Secondary | ICD-10-CM

## 2021-05-16 DIAGNOSIS — Z20822 Contact with and (suspected) exposure to covid-19: Secondary | ICD-10-CM | POA: Diagnosis present

## 2021-05-16 DIAGNOSIS — Z882 Allergy status to sulfonamides status: Secondary | ICD-10-CM

## 2021-05-16 DIAGNOSIS — N390 Urinary tract infection, site not specified: Secondary | ICD-10-CM

## 2021-05-16 DIAGNOSIS — N400 Enlarged prostate without lower urinary tract symptoms: Secondary | ICD-10-CM | POA: Diagnosis present

## 2021-05-16 DIAGNOSIS — Z833 Family history of diabetes mellitus: Secondary | ICD-10-CM

## 2021-05-16 DIAGNOSIS — Z8672 Personal history of thrombophlebitis: Secondary | ICD-10-CM

## 2021-05-16 DIAGNOSIS — E785 Hyperlipidemia, unspecified: Secondary | ICD-10-CM | POA: Diagnosis present

## 2021-05-16 DIAGNOSIS — Z86718 Personal history of other venous thrombosis and embolism: Secondary | ICD-10-CM

## 2021-05-16 DIAGNOSIS — M1990 Unspecified osteoarthritis, unspecified site: Secondary | ICD-10-CM | POA: Diagnosis present

## 2021-05-16 DIAGNOSIS — Z7901 Long term (current) use of anticoagulants: Secondary | ICD-10-CM

## 2021-05-16 DIAGNOSIS — Z96611 Presence of right artificial shoulder joint: Secondary | ICD-10-CM | POA: Diagnosis present

## 2021-05-16 DIAGNOSIS — N138 Other obstructive and reflux uropathy: Secondary | ICD-10-CM | POA: Diagnosis not present

## 2021-05-16 DIAGNOSIS — A4152 Sepsis due to Pseudomonas: Secondary | ICD-10-CM | POA: Diagnosis not present

## 2021-05-16 DIAGNOSIS — Z932 Ileostomy status: Secondary | ICD-10-CM

## 2021-05-16 DIAGNOSIS — Z888 Allergy status to other drugs, medicaments and biological substances status: Secondary | ICD-10-CM

## 2021-05-16 DIAGNOSIS — Z791 Long term (current) use of non-steroidal anti-inflammatories (NSAID): Secondary | ICD-10-CM

## 2021-05-16 DIAGNOSIS — E1165 Type 2 diabetes mellitus with hyperglycemia: Secondary | ICD-10-CM | POA: Diagnosis not present

## 2021-05-16 DIAGNOSIS — Z885 Allergy status to narcotic agent status: Secondary | ICD-10-CM

## 2021-05-16 DIAGNOSIS — K219 Gastro-esophageal reflux disease without esophagitis: Secondary | ICD-10-CM | POA: Diagnosis present

## 2021-05-16 DIAGNOSIS — F250 Schizoaffective disorder, bipolar type: Secondary | ICD-10-CM | POA: Diagnosis present

## 2021-05-16 DIAGNOSIS — H5500 Unspecified nystagmus: Secondary | ICD-10-CM | POA: Diagnosis present

## 2021-05-16 DIAGNOSIS — Z9079 Acquired absence of other genital organ(s): Secondary | ICD-10-CM

## 2021-05-16 DIAGNOSIS — Z825 Family history of asthma and other chronic lower respiratory diseases: Secondary | ICD-10-CM

## 2021-05-16 DIAGNOSIS — E119 Type 2 diabetes mellitus without complications: Secondary | ICD-10-CM

## 2021-05-16 DIAGNOSIS — Z96612 Presence of left artificial shoulder joint: Secondary | ICD-10-CM | POA: Diagnosis present

## 2021-05-16 DIAGNOSIS — Z96641 Presence of right artificial hip joint: Secondary | ICD-10-CM | POA: Diagnosis present

## 2021-05-16 DIAGNOSIS — Z886 Allergy status to analgesic agent status: Secondary | ICD-10-CM

## 2021-05-16 DIAGNOSIS — Z7984 Long term (current) use of oral hypoglycemic drugs: Secondary | ICD-10-CM

## 2021-05-16 LAB — CBC WITH DIFFERENTIAL/PLATELET
Abs Immature Granulocytes: 0.03 10*3/uL (ref 0.00–0.07)
Basophils Absolute: 0 10*3/uL (ref 0.0–0.1)
Basophils Relative: 0 %
Eosinophils Absolute: 0 10*3/uL (ref 0.0–0.5)
Eosinophils Relative: 0 %
HCT: 25.1 % — ABNORMAL LOW (ref 39.0–52.0)
Hemoglobin: 9 g/dL — ABNORMAL LOW (ref 13.0–17.0)
Immature Granulocytes: 1 %
Lymphocytes Relative: 7 %
Lymphs Abs: 0.4 10*3/uL — ABNORMAL LOW (ref 0.7–4.0)
MCH: 42.5 pg — ABNORMAL HIGH (ref 26.0–34.0)
MCHC: 35.9 g/dL (ref 30.0–36.0)
MCV: 118.4 fL — ABNORMAL HIGH (ref 80.0–100.0)
Monocytes Absolute: 0.8 10*3/uL (ref 0.1–1.0)
Monocytes Relative: 16 %
Neutro Abs: 4 10*3/uL (ref 1.7–7.7)
Neutrophils Relative %: 76 %
Platelets: 102 10*3/uL — ABNORMAL LOW (ref 150–400)
RBC: 2.12 MIL/uL — ABNORMAL LOW (ref 4.22–5.81)
RDW: 18 % — ABNORMAL HIGH (ref 11.5–15.5)
WBC: 5.2 10*3/uL (ref 4.0–10.5)
nRBC: 0 % (ref 0.0–0.2)

## 2021-05-16 LAB — BLOOD GAS, VENOUS
Acid-base deficit: 1.6 mmol/L (ref 0.0–2.0)
Bicarbonate: 22.4 mmol/L (ref 20.0–28.0)
FIO2: 21
O2 Saturation: 39.6 %
Patient temperature: 38
pCO2, Ven: 36.2 mmHg — ABNORMAL LOW (ref 44.0–60.0)
pH, Ven: 7.408 (ref 7.250–7.430)
pO2, Ven: <31 mmHg — CL (ref 32.0–45.0)

## 2021-05-16 LAB — COMPREHENSIVE METABOLIC PANEL
ALT: 33 U/L (ref 0–44)
AST: 35 U/L (ref 15–41)
Albumin: 2.9 g/dL — ABNORMAL LOW (ref 3.5–5.0)
Alkaline Phosphatase: 65 U/L (ref 38–126)
Anion gap: 6 (ref 5–15)
BUN: 15 mg/dL (ref 6–20)
CO2: 21 mmol/L — ABNORMAL LOW (ref 22–32)
Calcium: 8.1 mg/dL — ABNORMAL LOW (ref 8.9–10.3)
Chloride: 101 mmol/L (ref 98–111)
Creatinine, Ser: 0.92 mg/dL (ref 0.61–1.24)
GFR, Estimated: >60 mL/min (ref >60)
Glucose, Bld: 106 mg/dL — ABNORMAL HIGH (ref 70–99)
Potassium: 3.9 mmol/L (ref 3.5–5.1)
Sodium: 128 mmol/L — ABNORMAL LOW (ref 135–145)
Total Bilirubin: 1.8 mg/dL — ABNORMAL HIGH (ref 0.3–1.2)
Total Protein: 5.9 g/dL — ABNORMAL LOW (ref 6.5–8.1)

## 2021-05-16 LAB — RAPID URINE DRUG SCREEN, HOSP PERFORMED
Amphetamines: NOT DETECTED
Barbiturates: NOT DETECTED
Benzodiazepines: NOT DETECTED
Cocaine: NOT DETECTED
Opiates: NOT DETECTED
Tetrahydrocannabinol: NOT DETECTED

## 2021-05-16 LAB — URINALYSIS, ROUTINE W REFLEX MICROSCOPIC
Bilirubin Urine: NEGATIVE
Glucose, UA: NEGATIVE mg/dL
Ketones, ur: NEGATIVE mg/dL
Nitrite: POSITIVE — AB
Protein, ur: NEGATIVE mg/dL
Specific Gravity, Urine: 1.016 (ref 1.005–1.030)
pH: 6 (ref 5.0–8.0)

## 2021-05-16 LAB — RESP PANEL BY RT-PCR (FLU A&B, COVID) ARPGX2
Influenza A by PCR: NEGATIVE
Influenza B by PCR: NEGATIVE
SARS Coronavirus 2 by RT PCR: NEGATIVE

## 2021-05-16 LAB — ETHANOL: Alcohol, Ethyl (B): <10 mg/dL (ref <10)

## 2021-05-16 MED ORDER — SODIUM CHLORIDE 0.9 % IV SOLN
1.0000 g | Freq: Once | INTRAVENOUS | Status: AC
  Administered 2021-05-16: 1 g via INTRAVENOUS
  Filled 2021-05-16: qty 10

## 2021-05-16 MED ORDER — SODIUM CHLORIDE 0.9 % IV BOLUS
500.0000 mL | Freq: Once | INTRAVENOUS | Status: AC
  Administered 2021-05-16: 500 mL via INTRAVENOUS

## 2021-05-16 MED ORDER — SODIUM CHLORIDE 0.9 % IV SOLN: INTRAVENOUS | Status: DC

## 2021-05-16 MED ORDER — ACETAMINOPHEN 500 MG PO TABS
1000.0000 mg | ORAL_TABLET | Freq: Once | ORAL | Status: AC
  Administered 2021-05-16: 1000 mg via ORAL
  Filled 2021-05-16: qty 2

## 2021-05-16 NOTE — ED Provider Notes (Signed)
This patient is still confused and mildly encephalopathic, temperature is 100.3 and there is evidence of UTI, the patient is still confused and will need to be admitted to the hospital, I discussed his care with the hospitalist Dr. Nehemiah Settle who will come to admit.   Noemi Chapel, MD 05/16/21 2017

## 2021-05-16 NOTE — ED Provider Notes (Signed)
[facility] EMERGENCY DEPARTMENT Provider Note   CSN: [id number] Arrival date & time: 05/16/21  1326     History No chief complaint on file.   [name] is a 55 y.o. male.  HPI Patient presenting for confusion, from home.  He came by EMS.  Is unclear how they arrived to his home to transport him.  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[place])    right, s/p replacement, due to prednisone   Bipolar disorder ([place])    Colostomy in place [facility])    Crohn's colitis ([place])    s/p total colectomy with ileostomy in 2009   Crohn's disease of small intestine [facility]) Sept 2012   ileoscopy: multiple ulcers likely secondary to [place], HX OF 02/11/2009   Qualifier: Diagnosis of  By: Zeb Comfort     Depression    DVT (deep venous thrombosis) ([place]) 2009   right upper extremity due to PICC   Enterococcus UTI 2009   GERD (gastroesophageal reflux disease)    Hernia    Hyperlipidemia    Insomnia    Migraine headache    Nystagmus    PULMONARY EMBOLISM, HX OF 02/11/2009   Qualifier: Diagnosis of  By: Zeb Comfort     S/P endoscopy Sept 2012   mild gastritis, small hiatal hernia, no ulcers   Schizophrenia ([place])    Schizophrenia, acute ([place])     Patient Active Problem List   Diagnosis Date Noted   Diarrhea 09/04/2020   Abdominal pain 09/04/2020   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   At risk for osteopenia 02/06/2020   BPH (benign prostatic hyperplasia) 12/03/2019   Incomplete emptying of bladder 10/31/2019   Benign prostatic hyperplasia with urinary obstruction 10/31/2019   Falls, initial encounter 06/06/2019   Lactic acidosis 02/10/2018   Partial small bowel obstruction ([place]) 02/10/2018   UTI (urinary tract infection) 12/31/2017   SBO (small bowel obstruction) ([place]) 12/29/2017   Arthritis of knee, degenerative 02/14/2013    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 03/29/2012   Crohn's disease of both small and large intestine with complication ([place]) 80/99/8338   Hepatic steatosis 07/19/2011   Hyperlipidemia 02/11/2009   BIPOLAR AFFECTIVE DISORDER 02/11/2009   NYSTAGMUS 02/11/2009   ASTHMA 02/11/2009   GERD 02/11/2009   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   INSOMNIA 02/11/2009    Past Surgical History:  Procedure Laterality Date   APPENDECTOMY     ESOPHAGOGASTRODUODENOSCOPY  07/2010   gastritis,  bx neg for H.Pylori   ESOPHAGOGASTRODUODENOSCOPY N/A 01/08/2014   SLF:NO SOURCE FOR ODYNOPHAGIA IDENTIFIED/Multiple small ulcers in the gastric antrum   EYE SURGERY     ILEOSCOPY  06/29/2011   SLF: ulcers,multiple/small HH/mild gastritis   KIDNEY SURGERY     LAPAROTOMY N/A 06/05/2013   Procedure: EXPLORATORY LAPAROTOMY;  Surgeon: Donato Heinz, MD;  Location: [facility] ORS;  Service: General;  Laterality: N/A;   LYSIS OF ADHESION N/A 06/05/2013   Procedure: LYSIS OF ADHESIONS;  Surgeon: Donato Heinz, MD;  Location: [facility] ORS;  Service: General;  Laterality: N/A;   small bowel capsule study  08/2010   few tiny nonbleeding erosions/ulcers ?secondary to relafen or Crohn's. Entire small bowel not seen.    TOTAL COLECTOMY  2009   with ileostomy at [facility] for refractory disease    TOTAL HIP  ARTHROPLASTY     right, due to avascular necrosis from chronic prednisone   TOTAL SHOULDER REPLACEMENT     bilateral   TRANSURETHRAL RESECTION OF PROSTATE N/A 12/03/2019   Procedure: TRANSURETHRAL RESECTION OF THE PROSTATE (TURP);  Surgeon: Cleon Gustin, MD;  Location: [facility] ORS;  Service: Urology;  Laterality: N/A;       Family History  Problem Relation Age of Onset   Diabetes Mother    Heart disease Mother    Diabetes Father    Heart disease Father    COPD Maternal Grandmother    Colon cancer Neg Hx     Social History   Tobacco Use   Smoking status: Every Day    Packs/day: 4.00    Years: 37.00     Pack years: 148.00    Types: Cigarettes   Smokeless tobacco: Never   Tobacco comments:    down to 4ppd 11/14/20  Vaping Use   Vaping Use: Never used  Substance Use Topics   Alcohol use: No   Drug use: No    Home Medications Prior to Admission medications   Medication Sig Start Date End Date Taking? Authorizing Provider  ACCU-CHEK AVIVA PLUS test strip SMARTSIG:Via Meter 05/02/20   [provider]  acetaminophen (TYLENOL) 500 MG tablet Take 1,000 mg by mouth every 6 (six) hours as needed. For pain Patient not taking: Reported on 05/11/2021    [provider]  albuterol (PROVENTIL) (2.5 MG/3ML) 0.083% nebulizer solution Take 2.5 mg by nebulization every 6 (six) hours as needed for wheezing or shortness of breath. Patient not taking: Reported on 05/11/2021    [provider]  albuterol (VENTOLIN HFA) 108 (90 Base) MCG/ACT inhaler Inhale 2 puffs into the lungs every 6 (six) hours as needed for wheezing or shortness of breath. Patient not taking: Reported on 05/11/2021    [provider]  atorvastatin (LIPITOR) 80 MG tablet Take 80 mg by mouth daily.    [provider]  azelastine (ASTELIN) 0.1 % nasal spray Place 2 sprays into both nostrils 2 (two) times daily. 05/15/20   [provider]  baclofen (LIORESAL) 10 MG tablet Take 10 mg by mouth daily. 10/04/19   [provider]  benztropine (COGENTIN) 0.5 MG tablet Take 0.5 mg by mouth daily.     [provider]  CALCIUM 600/VITAMIN D3 600-800 MG-UNIT TABS TAKE (1) TABLET BY MOUTH THREE TIMES DAILY AFTER MEALS. 12/20/20   Mahala Menghini, PA-C  Cyanocobalamin (B-12) 1000 MCG CAPS Take by mouth daily.    [provider]  dicyclomine (BENTYL) 10 MG capsule Take 1 capsule (10 mg total) by mouth as needed prior to meals and at bedtime for abdominal cramping. Hold in the setting of constipation. Patient not taking: Reported on 05/11/2021 05/01/21   Erenest Rasher, PA-C   divalproex (DEPAKOTE ER) 250 MG 24 hr tablet Take 250 mg by mouth daily.     [provider]  divalproex (DEPAKOTE) 250 MG DR tablet Take by mouth at bedtime. 11/21/20   [provider]  divalproex (DEPAKOTE) 500 MG 24 hr tablet Take 1,000 mg by mouth every evening.    [provider]  divalproex (DEPAKOTE) 500 MG DR tablet Take 500 mg by mouth 2 (two) times daily. 11/21/20   [provider]  docusate sodium (COLACE) 100 MG capsule Take 1 capsule (100 mg total) by mouth 2 (two) times daily. Patient taking differently: Take 100 mg by mouth 2 (two) times daily as  needed. 02/14/18   Kathie Dike, MD  ELIQUIS 5 MG TABS tablet Take 5 mg by mouth 2 (two) times daily. 07/14/20   [provider]  FLOVENT HFA 110 MCG/ACT inhaler Inhale 2 puffs into the lungs 2 (two) times daily. 01/19/21   [provider]  furosemide (LASIX) 40 MG tablet Take 40 mg by mouth daily.    [provider]  gabapentin (NEURONTIN) 300 MG capsule Take 900 mg by mouth 3 (three) times daily.    [provider]  meloxicam (MOBIC) 7.5 MG tablet Take 7.5 mg by mouth daily. 11/08/19   [provider]  metFORMIN (GLUCOPHAGE-XR) 500 MG 24 hr tablet Take 1,000 mg by mouth 2 (two) times daily. 11/21/20   [provider]  metoprolol succinate (TOPROL-XL) 25 MG 24 hr tablet Take 1 tablet (25 mg total) by mouth daily. Take with or immediately following a meal. 01/26/21   Elouise Munroe, MD  mirtazapine (REMERON) 15 MG tablet Take 15 mg by mouth at bedtime.     [provider]  Misc. Devices (WALKER WHEELS) MISC USE AS DIRECTED 06/06/19   Fields, Marga Melnick, MD  mometasone-formoterol [facility]) 200-5 MCG/ACT AERO Inhale 2 puffs into the lungs in the morning and at bedtime. 01/19/21   Freddi Starr, MD  nitroGLYCERIN (NITROSTAT) 0.4 MG SL tablet Place 1 tablet (0.4 mg total) under the tongue every 5 (five) minutes as needed for chest pain. Patient not  taking: Reported on 04/22/2021 01/26/21 04/26/21  Elouise Munroe, MD  Ostomy Supplies (ACTIVE LIFE 1-PC DRAIN [phone]) Pouch MISC  05/21/20   [provider]  pantoprazole (PROTONIX) 40 MG tablet TAKE (1) TABLET BY MOUTH TWICE A DAY BEFORE MEALS. (BREAKFAST AND SUPPER) 08/01/20   Mahala Menghini, PA-C  potassium chloride SA (K-DUR,KLOR-CON) 20 MEQ tablet Take 20 mEq by mouth daily.     [provider]  risperiDONE (RISPERDAL) 1 MG tablet Take 1 tablet by mouth at bedtime. 09/29/18   [provider]  sertraline (ZOLOFT) 50 MG tablet Take 50 mg by mouth daily.    [provider]  tamsulosin (FLOMAX) 0.4 MG CAPS capsule Take 0.4 mg by mouth daily. 07/03/20   [provider]  Tiotropium Bromide Monohydrate (SPIRIVA RESPIMAT) 2.5 MCG/ACT AERS Inhale 2 puffs into the lungs daily. 01/19/21   Freddi Starr, MD  traZODone (DESYREL) 50 MG tablet Take 50 mg by mouth at bedtime.    [provider]    Allergies    Other, Aspirin, Bactrim [sulfamethoxazole-trimethoprim], Codeine, Ibuprofen, Oxycodone-acetaminophen, Penicillins, Remicade [infliximab], Tramadol, Tramadol hcl, and Vicodin [hydrocodone-acetaminophen]  Review of Systems   Review of Systems  Unable to perform ROS: Mental status change   Physical Exam Updated Vital Signs BP 94/77   Pulse (!) 103   Temp 100.3 F (37.9 C)   Resp 20   Ht 5' 6"  (1.676 m)   Wt 93.1 kg   SpO2 98%   BMI 33.13 kg/m   Physical Exam Vitals and nursing note reviewed.  Constitutional:      Appearance: He is well-developed. He is not ill-appearing.  HENT:     Head: Normocephalic and atraumatic.     Right Ear: External ear normal.     Left Ear: External ear normal.  Eyes:     Conjunctiva/sclera: Conjunctivae normal.     Pupils: Pupils are equal, round, and reactive to light.  Neck:     Trachea: Phonation normal.  Cardiovascular:     Rate  and Rhythm: Tachycardia present.  Pulmonary:     Effort:  Pulmonary effort is normal.  Abdominal:     General: There is no distension.  Musculoskeletal:        General: Normal range of motion.     Cervical back: Normal range of motion and neck supple.  Skin:    General: Skin is warm and dry.  Neurological:     Mental Status: He is alert.     Cranial Nerves: No cranial nerve deficit.     Motor: No abnormal muscle tone.     Coordination: Coordination normal.  Psychiatric:        Attention and Perception: He is inattentive.        Mood and Affect: Mood is anxious. Affect is inappropriate.        Speech: He is noncommunicative. Speech is delayed and tangential.        Behavior: Behavior is hyperactive.        Thought Content: Thought content is not delusional. Thought content does not include homicidal or suicidal ideation.        Cognition and Memory: Cognition is impaired.        Judgment: Judgment is impulsive and inappropriate.    ED Results / Procedures / Treatments   Labs (all labs ordered are listed, but only abnormal results are displayed) Labs Reviewed  COMPREHENSIVE METABOLIC PANEL - Abnormal; Notable for the following components:      Result Value   Sodium 128 (*)    CO2 21 (*)    Glucose, Bld 106 (*)    Calcium 8.1 (*)    Total Protein 5.9 (*)    Albumin 2.9 (*)    Total Bilirubin 1.8 (*)    All other components within normal limits  CBC WITH DIFFERENTIAL/PLATELET - Abnormal; Notable for the following components:   RBC 2.12 (*)    Hemoglobin 9.0 (*)    HCT 25.1 (*)    MCV 118.4 (*)    MCH 42.5 (*)    RDW 18.0 (*)    Platelets 102 (*)    Lymphs Abs 0.4 (*)    All other components within normal limits  BLOOD GAS, VENOUS - Abnormal; Notable for the following components:   pCO2, Ven 36.2 (*)    pO2, Ven <31.0 (*)    All other components within normal limits  URINALYSIS, ROUTINE W REFLEX MICROSCOPIC - Abnormal; Notable for the following components:   APPearance HAZY (*)    Hgb urine dipstick SMALL (*)    Nitrite  POSITIVE (*)    Leukocytes,Ua TRACE (*)    Bacteria, UA RARE (*)    All other components within normal limits  RESP PANEL BY RT-PCR (FLU A&B, COVID) ARPGX2  URINE CULTURE  ETHANOL  RAPID URINE DRUG SCREEN, HOSP PERFORMED    EKG EKG Interpretation  Date/Time:  Saturday May 16 2021 13:48:33 EDT Ventricular Rate:  95 PR Interval:  159 QRS Duration: 81 QT Interval:  336 QTC Calculation: 423 R Axis:   81 Text Interpretation: Sinus rhythm Confirmed by Daleen Bo [phone]) on 05/16/2021 4:07:56 PM  Radiology DG Chest 1 View  Result Date: 05/16/2021 CLINICAL DATA:  55 year old male with a history of fever EXAM: CHEST  1 VIEW COMPARISON:  06/19/2020 FINDINGS: Cardiomediastinal silhouette unchanged in size and contour. Unchanged right IJ port catheter. No pneumothorax or pleural effusion. No confluent airspace disease. Coarsened interstitial markings similar to the prior. Surgical changes of bilateral glenohumeral joints unchanged. IMPRESSION: Negative for  acute cardiopulmonary disease Electronically Signed   By: Corrie Mckusick D.O.   On: 05/16/2021 14:59   CT Head Wo Contrast  Result Date: 05/16/2021 CLINICAL DATA:  Delirium.  Confusion. EXAM: CT HEAD WITHOUT CONTRAST TECHNIQUE: Contiguous axial images were obtained from the base of the skull through the vertex without intravenous contrast. COMPARISON:  Head CT 05/14/2008 FINDINGS: Brain: No intracranial hemorrhage, mass effect, or midline shift. No hydrocephalus. The basilar cisterns are patent. No evidence of territorial infarct or acute ischemia. No extra-axial or intracranial fluid collection. Vascular: No hyperdense vessel or unexpected calcification. Skull: No fracture or focal lesion. Sinuses/Orbits: No acute findings. Paranasal sinuses and mastoid air cells are clear. Unremarkable orbits. Other: None. IMPRESSION: Negative noncontrast head CT. Electronically Signed   By: Keith Rake M.D.   On: 05/16/2021 15:52     Procedures .Critical Care  Date/Time: 05/16/2021 4:17 PM Performed by: Daleen Bo, MD Authorized by: Daleen Bo, MD   Critical care provider statement:    Critical care time (minutes):  35   Critical care start time:  05/16/2021 2:05 PM   Critical care end time:  05/16/2021 4:17 PM   Critical care time was exclusive of:  Separately billable procedures and treating other patients   Critical care was necessary to treat or prevent imminent or life-threatening deterioration of the following conditions:  CNS failure or compromise   Critical care was time spent personally by me on the following activities:  Blood draw for specimens, development of treatment plan with patient or surrogate, discussions with consultants, evaluation of patient's response to treatment, examination of patient, obtaining history from patient or surrogate, ordering and performing treatments and interventions, ordering and review of laboratory studies, pulse oximetry, re-evaluation of patient's condition, review of old charts and ordering and review of radiographic studies   Medications Ordered in ED Medications  cefTRIAXone (ROCEPHIN) 1 g in sodium chloride 0.9 % 100 mL IVPB (has no administration in time range)  sodium chloride 0.9 % bolus 500 mL (has no administration in time range)    ED Course  I have reviewed the triage vital signs and the nursing notes.  Pertinent labs & imaging results that were available during my care of the patient were reviewed by me and considered in my medical decision making (see chart for details).  Clinical Course as of 05/16/21 1617  Sat May 16, 2021  1407 I was called urgently to the room at 2:02 PM.  Patient informed staff he was leaving.  He is standing up, confused, when I asked him where he lives he stated "[place]."  He indicated to me that he wanted to leave without evaluation.  He is holding his left hand, prescriptions for tramadol and Keflex, prescribed on  05/06/2021. [EW]  1408 I informed nursing staff and secretary that I will commit the patient.  Security at bedside assisting patient back into bed.  He will be restrained as needed. [EW]    Clinical Course User Index [EW] Daleen Bo, MD   MDM Rules/Calculators/A&P                            Patient Vitals for the past 24 hrs:  BP Temp Pulse Resp SpO2 Height Weight  05/16/21 1342 -- -- -- -- -- 5' 6"  (1.676 m) 93.1 kg  05/16/21 1340 94/77 100.3 F (37.9 C) (!) 103 20 98 % -- --    4:05 PM-  reevaluation with update and  discussion. After initial assessment and treatment, an updated evaluation reveals he has been calm since placed under IVC and cooperative with attempts to evaluate him.  No change in clinical status.Daleen Bo   Medical Decision Making:  This patient is presenting for evaluation of confusion, which does require a range of treatment options, and is a complaint that involves a high risk of morbidity and mortality. The differential diagnoses include UTI, sepsis, metabolic disorder, psychiatric illness. I decided to review old records, and in summary middle-aged male presenting with confusion, by EMS, unable to give history..  I was additional historical information from anyone.  Listed contact did not answer the phone when I rang, at 4:15 PM.  Clinical Laboratory Tests Ordered, included CBC, Metabolic panel, Urinalysis, and venous blood gas, viral panel, UDS . Review indicates normal except sodium low, CO2 low, glucose high, calcium low, total protein low, albumin low, total bilirubin high, hemoglobin low. Radiologic Tests Ordered, included CT head, chest x-ray.  I independently Visualized: Radiographic images, which show no acute abnormality    Critical Interventions-clinical evaluation, laboratory testing, radiography, IV fluids, observation, empiric antibiotics, psychiatric consult.  After These Interventions, the Patient was reevaluated and was found with  nonspecific confusion and UTI, likely partially treated.  Urine culture sent, medications and fluids given, requested psychiatric consultation.  Medical evaluation consistent with mild hyponatremia, near baseline and stable low hemoglobin.  Doubt impending vascular collapse.  Patient is medically cleared for treatment by psychiatry.  CRITICAL CARE-yes Performed by: Daleen Bo  Nursing Notes Reviewed/ Care Coordinated Applicable Imaging Reviewed Interpretation of Laboratory Data incorporated into ED treatment  Plan-disposition by TTS in conjunction with oncoming provider team    Final Clinical Impression(s) / ED Diagnoses Final diagnoses:  Altered mental status, unspecified altered mental status type  Urinary tract infection without hematuria, site unspecified    Rx / DC Orders ED Discharge Orders     None        Daleen Bo, MD 05/16/21 [phone]

## 2021-05-16 NOTE — ED Triage Notes (Signed)
Brought in by ems from home for ams.  Reports pt is confuse and unable to answers question.  Pt is alert to self only.  Disoriented otherwise.  Resp even and unlabored.  Febrile.  Skin warm and dry.  Denies pain.

## 2021-05-16 NOTE — BH Assessment (Addendum)
Comprehensive Clinical Assessment (CCA) Note  05/16/2021 [name] [id number]  Disposition: Per Alhaji Ala, NP this patient does not meet in patient care criteria and is psych cleared. NP recommends considering consult to neurology. [facility] notified.  The patient demonstrates the following risk factors for suicide: Chronic risk factors for suicide include: psychiatric disorder of bipolar affective disorder . Acute risk factors for suicide include:  none . Protective factors for this patient include: coping skills, hope for the future, and life satisfaction. Considering these factors, the overall suicide risk at this point appears to be low. Patient is appropriate for outpatient follow up.   [facility] from 05/16/2021 in [place] ED from 05/06/2021 in [place] No Risk No Risk       Patient is a 55 year old male presenting voluntarily to [facility] via EMS with a chief complaint of AMS and confusion. Patient has since been placed under IVC by EDP and it is noted in his chart that he is medically clear for psychiatric evaluation. He states he does not know why he is in the hospital or how he got there. Patient is able to tell me his name, DOB, and that he is in the ER. He states he does not know the date, month, day of the week, or who the president is. He states he feels "a little tired but pretty good." He denies any history of mental illness or psychiatric hospitalizations. He denies SI/HI/AVH. Per chart review patient does have a history of bipolar affective disorder and schizophrenia, however these diagnoses were given in 2010, and he has not had any encounters in the [facility] system related to these. He states he is on disability. When asked why he responds, "ibuprofen." Patient denies any substance use and UDS is negative. He states he lives alone in [place] and does not have any natural supports.   Chief Complaint: AMS  Visit  Diagnosis:  Bipolar affective d/o (per history)    R/O UTI or differential diagnosis   CCA Screening, Triage and Referral (STR)  Patient Reported Information How did you hear about [place]? Other (Comment) (UTA)  What Is the Reason for Your Visit/Call Today? AMS  How Long Has This Been Causing You Problems? <Week  What Do You Feel Would Help You the Most Today? -- (states he does not feel he needs assistance)   Have You Recently Had Any Thoughts About Hurting Yourself? No  Are You Planning to Commit Suicide/Harm Yourself At This time? No   Have you Recently Had Thoughts About [place]? No  Are You Planning to Harm Someone at This Time? No  Explanation: No data recorded  Have You Used Any Alcohol or Drugs in the Past 24 Hours? No  How Long Ago Did You Use Drugs or Alcohol? No data recorded What Did You Use and How Much? No data recorded  Do You Currently Have a Therapist/Psychiatrist? No  Name of Therapist/Psychiatrist: No data recorded  Have You Been Recently Discharged From Any Office Practice or Programs? No  Explanation of Discharge From Practice/Program: No data recorded    CCA Screening Triage Referral Assessment Type of Contact: Tele-Assessment  Telemedicine Service Delivery: Telemedicine service delivery: This service was provided via telemedicine using a 2-way, interactive audio and video technology  Is this Initial or Reassessment? Initial Assessment  Date Telepsych consult ordered in [facility]:  05/16/21  Time Telepsych consult ordered in [facility]:  1739  Location  of Assessment: [facility]  Provider Location: [facility] Assessment Services   Collateral Involvement: none   Does Patient Have a Stage manager Guardian? No data recorded Name and Contact of Legal Guardian: No data recorded If Minor and Not Living with Parent(s), Who has Custody? No data recorded Is CPS involved or ever been involved? Never  Is APS involved or ever been involved?  Never   Patient Determined To Be At Risk for Harm To Self or Others Based on Review of Patient Reported Information or Presenting Complaint? No  Method: No data recorded Availability of Means: No data recorded Intent: No data recorded Notification Required: No data recorded Additional Information for Danger to Others Potential: No data recorded Additional Comments for Danger to Others Potential: No data recorded Are There Guns or Other Weapons in Your Home? No data recorded Types of Guns/Weapons: No data recorded Are These Weapons Safely Secured?                            No data recorded Who Could Verify You Are Able To Have These Secured: No data recorded Do You Have any Outstanding Charges, Pending Court Dates, Parole/Probation? No data recorded Contacted To Inform of Risk of Harm To Self or Others: No data recorded   Does Patient Present under Involuntary Commitment? Yes  IVC Papers Initial File Date: 05/16/21   [place] of Residence: [place]   Patient Currently Receiving the Following Services: Medication Management   Determination of Need: Routine (7 days)   Options For Referral: Medication Management; Inpatient Hospitalization; Outpatient Therapy     CCA Biopsychosocial Patient Reported Schizophrenia/Schizoaffective Diagnosis in Past: Yes   Strengths: UTA   Mental Health Symptoms Depression:   None   Duration of Depressive symptoms:    Mania:   None   Anxiety:    None   Psychosis:   Grossly disorganized or catatonic behavior   Duration of Psychotic symptoms:  Duration of Psychotic Symptoms: Less than six months   Trauma:   None   Obsessions:   None   Compulsions:   None   Inattention:   N/A   Hyperactivity/Impulsivity:   N/A   Oppositional/Defiant Behaviors:   N/A   Emotional Irregularity:   N/A   Other Mood/Personality Symptoms:  No data recorded   Mental Status Exam Appearance and self-care  Stature:   Average    Weight:   Overweight   Clothing:   Neat/clean   Grooming:   Normal   Cosmetic use:   None   Posture/gait:   Normal   Motor activity:   Not Remarkable   Sensorium  Attention:   Normal   Concentration:   Normal   Orientation:   X5   Recall/memory:   Defective in Remote; Defective in Recent; Defective in Short-term   Affect and Mood  Affect:   Appropriate   Mood:   Other (Comment) (calm, pleasant)   Relating  Eye contact:   Normal   Facial expression:   Responsive   Attitude toward examiner:   Cooperative   Thought and Language  Speech flow:  Clear and Coherent   Thought content:   Appropriate to Mood and Circumstances   Preoccupation:   None   Hallucinations:   None   Organization:  No data recorded  [employer] of Knowledge:   Good   Intelligence:   Needs investigation   Abstraction:   Concrete   Judgement:  Impaired   Reality Testing:   Unaware   Insight:   Lacking   Decision Making:   Only simple   Social Functioning  Social Maturity:   Responsible   Social Judgement:   Normal   Stress  Stressors:   -- (none noted)   Coping Ability:   Normal   Skill Deficits:   Decision making   Supports:   Support needed     Religion: Religion/Spirituality Are You A Religious Person?: No  Leisure/Recreation: Leisure / Recreation Do You Have Hobbies?:  (UTA)  Exercise/Diet: Exercise/Diet Do You Exercise?:  (UTA) Have You Gained or Lost A Significant Amount of Weight in the Past Six Months?: No Do You Follow a Special Diet?: No Do You Have Any Trouble Sleeping?: No   CCA Employment/Education Employment/Work Situation: Employment / Work Situation Employment Situation: On disability Why is Patient on Disability: "I don't know" How Long has Patient Been on Disability: "a long time" Patient's Job has Been Impacted by Current Illness: No Has Patient ever Been in the [employer]?:  No  Education: Education Is Patient Currently Attending School?: No Last Grade Completed:  (UTA) Did You Attend College?:  (UTA) Did You Have An Individualized Education Program (IIEP):  (UTA) Did You Have Any Difficulty At School?:  (UTA) Patient's Education Has Been Impacted by Current Illness:  (UTA)   CCA Family/Childhood History Family and Relationship History: Family history Marital status: Single Does patient have children?:  (UTA)  Childhood History:  Childhood History By whom was/is the patient raised?:  (UTA) Did patient suffer any verbal/emotional/physical/sexual abuse as a child?:  (UTA) Did patient suffer from severe childhood neglect?:  (UTA) Has patient ever been sexually abused/assaulted/raped as an adolescent or adult?:  (UTA) Was the patient ever a victim of a crime or a disaster?:  (UTA) Witnessed domestic violence?:  (UTA) Has patient been affected by domestic violence as an adult?:  Special educational needs teacher)  Child/Adolescent Assessment:     CCA Substance Use Alcohol/Drug Use: Alcohol / Drug Use Pain Medications: see MAR Prescriptions: see MAR Over the Counter: see MAR History of alcohol / drug use?: No history of alcohol / drug abuse                         ASAM's:  Six Dimensions of Multidimensional Assessment  Dimension 1:  Acute Intoxication and/or Withdrawal Potential:      Dimension 2:  Biomedical Conditions and Complications:      Dimension 3:  Emotional, Behavioral, or Cognitive Conditions and Complications:     Dimension 4:  Readiness to Change:     Dimension 5:  Relapse, Continued use, or Continued Problem Potential:     Dimension 6:  Recovery/Living Environment:     ASAM Severity Score:    ASAM Recommended Level of Treatment:     Substance use Disorder (SUD)    Recommendations for Services/Supports/Treatments:    Discharge Disposition:    DSM5 Diagnoses: Patient Active Problem List   Diagnosis Date Noted   Diarrhea 09/04/2020    Abdominal pain 09/04/2020   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   At risk for osteopenia 02/06/2020   BPH (benign prostatic hyperplasia) 12/03/2019   Incomplete emptying of bladder 10/31/2019   Benign prostatic hyperplasia with urinary obstruction 10/31/2019   Falls, initial encounter 06/06/2019   Lactic acidosis 02/10/2018   Partial small bowel obstruction ([place]) 02/10/2018   UTI (urinary tract infection) 12/31/2017   SBO (small bowel obstruction) ([place])  12/29/2017   Arthritis of knee, degenerative 02/14/2013   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 03/29/2012   Crohn's disease of both small and large intestine with complication ([place]) 29/24/4628   Hepatic steatosis 07/19/2011   Hyperlipidemia 02/11/2009   BIPOLAR AFFECTIVE DISORDER 02/11/2009   NYSTAGMUS 02/11/2009   ASTHMA 02/11/2009   GERD 02/11/2009   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

## 2021-05-16 NOTE — H&P (Addendum)
History and Physical  [name] RWE:[id number] DOB: 04-05-1966 DOA: 05/16/2021  Referring physician: Dr Sabra Heck, ED physician PCP: [name], MD  Outpatient Specialists:   Patient Coming From: home  Chief Complaint: Altered mental status  HPI: [name] is a 55 y.o. male with a history of bipolar disorder, asthma, colostomy in place, history of small bowel obstruction secondary to Crohn's disease, history of DVT on chronic anticoagulation, GERD, schizophrenia.  Patient was brought from home by EMS secondary to altered mental status, fever.  Unfortunately, family members were unavailable for conversation.  The patient is completely encephalopathic and is unable to provide any history.  Emergency Department Course: Patient has temperature of 100.3.  Urine suggestive of UTI.  Urine culture sent.  DM 128.  Creatinine normal.  ABG shows a normal PCO2.  White count 5.2.  Hemoglobin 9.0.  COVID-negative.  UDS negative.  Review of Systems:  Unable to obtain  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[place])    right, s/p replacement, due to prednisone   Bipolar disorder ([place])    Colostomy in place [facility])    Crohn's colitis ([place])    s/p total colectomy with ileostomy in 2009   Crohn's disease of small intestine [facility]) Sept 2012   ileoscopy: multiple ulcers likely secondary to [place], HX OF 02/11/2009   Qualifier: Diagnosis of  By: Zeb Comfort     Depression    DVT (deep venous thrombosis) ([place]) 2009   right upper extremity due to PICC   Enterococcus UTI 2009   GERD (gastroesophageal reflux disease)    Hernia    Hyperlipidemia    Insomnia    Migraine headache    Nystagmus    PULMONARY EMBOLISM, HX OF 02/11/2009   Qualifier: Diagnosis of  By: Zeb Comfort     S/P endoscopy Sept 2012   mild gastritis, small hiatal hernia, no ulcers   Schizophrenia ([place])    Schizophrenia, acute  ([place])    Past Surgical History:  Procedure Laterality Date   APPENDECTOMY     ESOPHAGOGASTRODUODENOSCOPY  07/2010   gastritis,  bx neg for H.Pylori   ESOPHAGOGASTRODUODENOSCOPY N/A 01/08/2014   SLF:NO SOURCE FOR ODYNOPHAGIA IDENTIFIED/Multiple small ulcers in the gastric antrum   EYE SURGERY     ILEOSCOPY  06/29/2011   SLF: ulcers,multiple/small HH/mild gastritis   KIDNEY SURGERY     LAPAROTOMY N/A 06/05/2013   Procedure: EXPLORATORY LAPAROTOMY;  Surgeon: Donato Heinz, MD;  Location: [facility];  Service: General;  Laterality: N/A;   LYSIS OF ADHESION N/A 06/05/2013   Procedure: LYSIS OF ADHESIONS;  Surgeon: Donato Heinz, MD;  Location: [facility];  Service: General;  Laterality: N/A;   small bowel capsule study  08/2010   few tiny nonbleeding erosions/ulcers ?secondary to relafen or Crohn's. Entire small bowel not seen.    TOTAL COLECTOMY  2009   with ileostomy at [facility] for refractory disease    TOTAL HIP ARTHROPLASTY     right, due to avascular necrosis from chronic prednisone   TOTAL SHOULDER REPLACEMENT     bilateral   TRANSURETHRAL RESECTION OF PROSTATE N/A 12/03/2019   Procedure: TRANSURETHRAL RESECTION OF THE PROSTATE (TURP);  Surgeon: Cleon Gustin, MD;  Location: [facility];  Service: Urology;  Laterality: N/A;   Social History:  reports that he has been smoking cigarettes. He has a 148.00 pack-year smoking history.  He has never used smokeless tobacco. He reports that he does not drink alcohol and does not use drugs. Patient lives at home  Allergies  Allergen Reactions   Bactrim [Sulfamethoxazole-Trimethoprim] Other (See Comments)    ABD CRAMPS AND DIARRHEA   Oxycodone-Acetaminophen Other (See Comments)    Other reaction(s): Swelling   Remicade [Infliximab] Other (See Comments)    COULDN'T BREATHE   Codeine     REACTION: unknown reaction   Ibuprofen     REACTION: unknown reaction   Penicillins     REACTION: unknown reaction   Tramadol Hcl     REACTION: unknown reaction    Aspirin     REACTION: unknown reaction   Tramadol Nausea Only    Other reaction(s): Nausea   Vicodin [Hydrocodone-Acetaminophen] Nausea Only    Family History  Problem Relation Age of Onset   Diabetes Mother    Heart disease Mother    Diabetes Father    Heart disease Father    COPD Maternal Grandmother    Colon cancer Neg Hx       Prior to Admission medications   Medication Sig Start Date End Date Taking? Authorizing Provider  ACCU-CHEK AVIVA PLUS test strip SMARTSIG:Via Meter 05/02/20  Yes [provider]  albuterol (PROVENTIL) (2.5 MG/3ML) 0.083% nebulizer solution Take 2.5 mg by nebulization every 6 (six) hours as needed for wheezing or shortness of breath.   Yes [provider]  albuterol (VENTOLIN HFA) 108 (90 Base) MCG/ACT inhaler Inhale 2 puffs into the lungs every 6 (six) hours as needed for wheezing or shortness of breath.   Yes [provider]  atorvastatin (LIPITOR) 80 MG tablet Take 80 mg by mouth daily.   Yes [provider]  azelastine (ASTELIN) 0.1 % nasal spray Place 2 sprays into both nostrils 2 (two) times daily. 05/15/20  Yes [provider]  baclofen (LIORESAL) 10 MG tablet Take 10 mg by mouth daily. 10/04/19  Yes [provider]  benztropine (COGENTIN) 0.5 MG tablet Take 0.5 mg by mouth daily.    Yes [provider]  CALCIUM 600/VITAMIN D3 600-800 MG-UNIT TABS TAKE (1) TABLET BY MOUTH THREE TIMES DAILY AFTER MEALS. 12/20/20  Yes Mahala Menghini, PA-C  Cyanocobalamin (B-12) 1000 MCG CAPS Take 1 capsule by mouth daily.   Yes [provider]  divalproex (DEPAKOTE) 250 MG DR tablet Take by mouth at bedtime. 11/21/20  Yes [provider]  divalproex (DEPAKOTE) 500 MG 24 hr tablet Take 1,000 mg by mouth every evening.   Yes [provider]  docusate sodium (COLACE) 100 MG capsule Take 1 capsule (100 mg total) by mouth 2 (two) times daily. Patient taking differently: Take 100 mg by  mouth 2 (two) times daily as needed for mild constipation. 02/14/18  Yes Memon, Jolaine Artist, MD  ELIQUIS 5 MG TABS tablet Take 5 mg by mouth 2 (two) times daily. 07/14/20  Yes [provider]  FLOVENT HFA 110 MCG/ACT inhaler Inhale 2 puffs into the lungs 2 (two) times daily. 01/19/21  Yes [provider]  furosemide (LASIX) 40 MG tablet Take 40 mg by mouth daily.   Yes [provider]  gabapentin (NEURONTIN) 300 MG capsule Take 900 mg by mouth 3 (three) times daily.   Yes [provider]  meloxicam (MOBIC) 7.5 MG tablet Take 7.5 mg by mouth daily. 11/08/19  Yes [provider]  metFORMIN (GLUCOPHAGE-XR) 500 MG 24 hr tablet Take 1,000 mg by mouth 2 (two) times daily. 11/21/20  Yes [provider]  metoprolol succinate (TOPROL-XL) 25 MG 24 hr tablet Take 1 tablet (25 mg total) by mouth daily. Take with or immediately following a meal. 01/26/21  Yes Elouise Munroe, MD  Misc. Devices (WALKER WHEELS) MISC USE AS DIRECTED 06/06/19  Yes Fields, Marga Melnick, MD  mometasone-formoterol (DULERA) 200-5 MCG/ACT AERO Inhale 2 puffs into the lungs in the morning and at bedtime. 01/19/21  Yes Freddi Starr, MD  nitroGLYCERIN (NITROSTAT) 0.4 MG SL tablet Place 1 tablet (0.4 mg total) under the tongue every 5 (five) minutes as needed for chest pain. 01/26/21 07/11/21 Yes Elouise Munroe, MD  Ostomy Supplies (ACTIVE LIFE 1-PC DRAIN [phone]) Pouch [place]  05/21/20  Yes [provider]  pantoprazole (PROTONIX) 40 MG tablet TAKE (1) TABLET BY MOUTH TWICE A DAY BEFORE MEALS. (BREAKFAST AND SUPPER) Patient taking differently: Take 40 mg by mouth 2 (two) times daily before a meal. 08/01/20  Yes Mahala Menghini, PA-C  potassium chloride SA (K-DUR,KLOR-CON) 20 MEQ tablet Take 20 mEq by mouth daily.    Yes [provider]  risperiDONE (RISPERDAL) 1 MG tablet Take 1 tablet by mouth at bedtime. 09/29/18  Yes [provider]  risperiDONE (RISPERDAL) 3 MG  tablet Take 3 mg by mouth 2 (two) times daily. 05/12/21  Yes [provider]  sertraline (ZOLOFT) 50 MG tablet Take 50 mg by mouth daily.   Yes [provider]  tamsulosin (FLOMAX) 0.4 MG CAPS capsule Take 0.4 mg by mouth daily. 07/03/20  Yes [provider]  Tiotropium Bromide Monohydrate (SPIRIVA RESPIMAT) 2.5 MCG/ACT AERS Inhale 2 puffs into the lungs daily. 01/19/21  Yes Freddi Starr, MD  traZODone (DESYREL) 100 MG tablet Take 100 mg by mouth at bedtime. 05/12/21  Yes [provider]  dicyclomine (BENTYL) 10 MG capsule Take 1 capsule (10 mg total) by mouth as needed prior to meals and at bedtime for abdominal cramping. Hold in the setting of constipation. Patient not taking: No sig reported 05/01/21   Erenest Rasher, PA-C    Physical Exam: BP (!) 111/57   Pulse 90   Temp 100.3 F (37.9 C)   Resp 18   Ht 5' 6"  (1.676 m)   Wt 93.1 kg   SpO2 97%   BMI 33.13 kg/m   General: Middle-age male. Awake and alert and oriented to person. no acute cardiopulmonary distress.  HEENT: Normocephalic atraumatic.  Right and left ears normal in appearance.  Pupils equal, round, reactive to light. Extraocular muscles are intact. Sclerae anicteric and noninjected.  Moist mucosal membranes. No mucosal lesions.  Neck: Neck supple without lymphadenopathy. No carotid bruits. No masses palpated.  Cardiovascular: Regular rate with normal S1-S2 sounds. No murmurs, rubs, gallops auscultated. No JVD.  Respiratory: Good respiratory effort with no wheezes, rales, rhonchi. Lungs clear to auscultation bilaterally.  No accessory muscle use. Abdomen: Soft, nontender, nondistended. Active bowel sounds. No masses or hepatosplenomegaly  Skin: No rashes, lesions, or ulcerations.  Dry, warm to touch. 2+ dorsalis pedis and radial pulses. Musculoskeletal: No calf or leg pain. All major joints not erythematous nontender.  No upper or lower joint deformation.  Good ROM.  No contractures   Psychiatric: unable to assess Neurologic: No gross focal neurological deficits.           Labs on Admission: I have personally reviewed following labs and imaging studies  CBC: Recent Labs  Lab 05/11/21 1146 05/16/21 1420  WBC 2.6* 5.2  NEUTROABS 1.4* 4.0  HGB 9.8* 9.0*  HCT 28.4* 25.1*  MCV 119.3* 118.4*  PLT 83* 035*   Basic Metabolic Panel: Recent Labs  Lab 05/16/21 1420  NA 128*  K 3.9  CL 101  CO2 21*  GLUCOSE 106*  BUN 15  CREATININE 0.92  CALCIUM 8.1*   GFR: Estimated Creatinine Clearance: 96.9 mL/min (by C-G formula based on SCr of 0.92 mg/dL). Liver Function Tests: Recent Labs  Lab 05/16/21 1420  AST 35  ALT 33  ALKPHOS 65  BILITOT 1.8*  PROT 5.9*  ALBUMIN 2.9*   No results for input(s): LIPASE, AMYLASE in the last 168 hours. No results for input(s): AMMONIA in the last 168 hours. Coagulation Profile: No results for input(s): INR, PROTIME in the last 168 hours. Cardiac Enzymes: No results for input(s): CKTOTAL, CKMB, CKMBINDEX, TROPONINI in the last 168 hours. BNP (last 3 results) No results for input(s): PROBNP in the last 8760 hours. HbA1C: No results for input(s): HGBA1C in the last 72 hours. CBG: No results for input(s): GLUCAP in the last 168 hours. Lipid Profile: No results for input(s): CHOL, HDL, LDLCALC, TRIG, CHOLHDL, LDLDIRECT in the last 72 hours. Thyroid Function Tests: No results for input(s): TSH, T4TOTAL, FREET4, T3FREE, THYROIDAB in the last 72 hours. Anemia Panel: No results for input(s): VITAMINB12, FOLATE, FERRITIN, TIBC, IRON, RETICCTPCT in the last 72 hours. Urine analysis:    Component Value Date/Time   COLORURINE YELLOW 05/16/2021 1411   APPEARANCEUR HAZY (A) 05/16/2021 1411   LABSPEC 1.016 05/16/2021 1411   PHURINE 6.0 05/16/2021 1411   GLUCOSEU NEGATIVE 05/16/2021 1411   HGBUR SMALL (A) 05/16/2021 1411   BILIRUBINUR NEGATIVE 05/16/2021 1411   KETONESUR NEGATIVE 05/16/2021 1411   PROTEINUR NEGATIVE  05/16/2021 1411   UROBILINOGEN 0.2 10/17/2014 1940   NITRITE POSITIVE (A) 05/16/2021 1411   LEUKOCYTESUR TRACE (A) 05/16/2021 1411   Sepsis Labs: @LABRCNTIP (procalcitonin:4,lacticidven:4) ) Recent Results (from the past 240 hour(s))  Resp Panel by RT-PCR (Flu A&B, Covid) Nasopharyngeal Swab     Status: None   Collection Time: 05/16/21  2:11 PM   Specimen: Nasopharyngeal Swab; Nasopharyngeal(NP) swabs in vial transport medium  Result Value Ref Range Status   SARS Coronavirus 2 by RT PCR NEGATIVE NEGATIVE Final    Comment: (NOTE) SARS-CoV-2 target nucleic acids are NOT DETECTED.  The SARS-CoV-2 RNA is generally detectable in upper respiratory specimens during the acute phase of infection. The lowest concentration of SARS-CoV-2 viral copies this assay can detect is 138 copies/mL. A negative result does not preclude SARS-Cov-2 infection and should not be used as the sole basis for treatment or other patient management decisions. A negative result may occur with  improper specimen collection/handling, submission of specimen other than nasopharyngeal swab, presence of viral mutation(s) within the areas targeted by this assay, and inadequate number of viral copies(<138 copies/mL). A negative result must be combined with clinical observations, patient history, and epidemiological information. The expected result is Negative.  Fact Sheet for Patients:  EntrepreneurPulse.com.au  Fact Sheet for Healthcare Providers:  IncredibleEmployment.be  This test is no t yet approved or cleared by the [place] FDA and  has been authorized for detection and/or diagnosis of SARS-CoV-2 by FDA under an Emergency Use Authorization (EUA). This EUA will remain  in effect (meaning this test can be used) for the duration of the COVID-19 declaration under Section 564(b)(1) of the Act, 21 U.S.C.section 360bbb-3(b)(1), unless the authorization is terminated  or revoked  sooner.       Influenza A by  PCR NEGATIVE NEGATIVE Final   Influenza B by PCR NEGATIVE NEGATIVE Final    Comment: (NOTE) The Xpert Xpress SARS-CoV-2/FLU/RSV plus assay is intended as an aid in the diagnosis of influenza from Nasopharyngeal swab specimens and should not be used as a sole basis for treatment. Nasal washings and aspirates are unacceptable for Xpert Xpress SARS-CoV-2/FLU/RSV testing.  Fact Sheet for Patients: EntrepreneurPulse.com.au  Fact Sheet for Healthcare Providers: IncredibleEmployment.be  This test is not yet approved or cleared by the [place] FDA and has been authorized for detection and/or diagnosis of SARS-CoV-2 by FDA under an Emergency Use Authorization (EUA). This EUA will remain in effect (meaning this test can be used) for the duration of the COVID-19 declaration under Section 564(b)(1) of the Act, 21 U.S.C. section 360bbb-3(b)(1), unless the authorization is terminated or revoked.  Performed at [facility], [address], [place], [place] [address]      Radiological Exams on Admission: DG Chest 1 View  Result Date: 05/16/2021 CLINICAL DATA:  55 year old male with a history of fever EXAM: CHEST  1 VIEW COMPARISON:  06/19/2020 FINDINGS: Cardiomediastinal silhouette unchanged in size and contour. Unchanged right IJ port catheter. No pneumothorax or pleural effusion. No confluent airspace disease. Coarsened interstitial markings similar to the prior. Surgical changes of bilateral glenohumeral joints unchanged. IMPRESSION: Negative for acute cardiopulmonary disease Electronically Signed   By: Corrie Mckusick D.O.   On: 05/16/2021 14:59   CT Head Wo Contrast  Result Date: 05/16/2021 CLINICAL DATA:  Delirium.  Confusion. EXAM: CT HEAD WITHOUT CONTRAST TECHNIQUE: Contiguous axial images were obtained from the base of the skull through the vertex without intravenous contrast. COMPARISON:  Head CT 05/14/2008 FINDINGS:  Brain: No intracranial hemorrhage, mass effect, or midline shift. No hydrocephalus. The basilar cisterns are patent. No evidence of territorial infarct or acute ischemia. No extra-axial or intracranial fluid collection. Vascular: No hyperdense vessel or unexpected calcification. Skull: No fracture or focal lesion. Sinuses/Orbits: No acute findings. Paranasal sinuses and mastoid air cells are clear. Unremarkable orbits. Other: None. IMPRESSION: Negative noncontrast head CT. Electronically Signed   By: Keith Rake M.D.   On: 05/16/2021 15:52    EKG: Independently reviewed. Sinus Rhythm  Assessment/Plan: Principal Problem:   Acute metabolic encephalopathy Active Problems:   BIPOLAR AFFECTIVE DISORDER   Nystagmus   Crohn's disease of both small and large intestine with complication (HCC)   Acute lower UTI   BPH (benign prostatic hyperplasia)   Recurrent deep vein thrombosis (DVT) (HCC)   Hyponatremia   DM type 2 (diabetes mellitus, type 2) ([place])    This patient was discussed with the ED physician, including pertinent vitals, physical exam findings, labs, and imaging.  We also discussed care given by the ED provider.  Acute metabolic encephalopathy At this point, we are assuming that the metabolic encephalopathy is secondary to the UTI.  We will treat the UTI and watch for clearing. Due to the patient's confusion, the patient is not able to leave the hospital on his own recognizance UTI Rocephin Blood cultures Urine cultures pending Repeat CBC in the morning Hyponatremia IV fluids Recheck BMP in the morning DM2 Hold metformin Levemir 20 units at night with SSI.  Bipolar, schizophrenia Continue home medications History of DVT On chronic anticoagulation   DVT prophylaxis: On Eliquis Consultants: None Code Status: Full code Family Communication: Unable to reach family Disposition Plan: Pending   Truett Mainland, DO

## 2021-05-17 DIAGNOSIS — R4182 Altered mental status, unspecified: Secondary | ICD-10-CM

## 2021-05-17 LAB — GLUCOSE, CAPILLARY
Glucose-Capillary: 96 mg/dL (ref 70–99)
Glucose-Capillary: 110 mg/dL — ABNORMAL HIGH (ref 70–99)
Glucose-Capillary: 122 mg/dL — ABNORMAL HIGH (ref 70–99)
Glucose-Capillary: 96 mg/dL (ref 70–99)
Glucose-Capillary: 97 mg/dL (ref 70–99)

## 2021-05-17 LAB — HEMOGLOBIN A1C
Hgb A1c MFr Bld: 4.8 % (ref 4.8–5.6)
Mean Plasma Glucose: 91.06 mg/dL

## 2021-05-17 LAB — CBC
HCT: 23 % — ABNORMAL LOW (ref 39.0–52.0)
Hemoglobin: 8 g/dL — ABNORMAL LOW (ref 13.0–17.0)
MCH: 40.8 pg — ABNORMAL HIGH (ref 26.0–34.0)
MCHC: 34.8 g/dL (ref 30.0–36.0)
MCV: 117.3 fL — ABNORMAL HIGH (ref 80.0–100.0)
Platelets: 126 10*3/uL — ABNORMAL LOW (ref 150–400)
RBC: 1.96 MIL/uL — ABNORMAL LOW (ref 4.22–5.81)
RDW: 18 % — ABNORMAL HIGH (ref 11.5–15.5)
WBC: 5.2 10*3/uL (ref 4.0–10.5)
nRBC: 0 % (ref 0.0–0.2)

## 2021-05-17 LAB — BASIC METABOLIC PANEL
Anion gap: 7 (ref 5–15)
BUN: 14 mg/dL (ref 6–20)
CO2: 21 mmol/L — ABNORMAL LOW (ref 22–32)
Calcium: 7.7 mg/dL — ABNORMAL LOW (ref 8.9–10.3)
Chloride: 106 mmol/L (ref 98–111)
Creatinine, Ser: 0.68 mg/dL (ref 0.61–1.24)
GFR, Estimated: >60 mL/min (ref >60)
Glucose, Bld: 92 mg/dL (ref 70–99)
Potassium: 3.8 mmol/L (ref 3.5–5.1)
Sodium: 134 mmol/L — ABNORMAL LOW (ref 135–145)

## 2021-05-17 MED ORDER — ONDANSETRON HCL 4 MG/2ML IJ SOLN: 4.0000 mg | Freq: Four times a day (QID) | INTRAMUSCULAR | Status: DC | PRN

## 2021-05-17 MED ORDER — METOPROLOL SUCCINATE ER 25 MG PO TB24
25.0000 mg | ORAL_TABLET | Freq: Every day | ORAL | Status: DC
  Administered 2021-05-17 – 2021-05-20 (×3): 25 mg via ORAL
  Filled 2021-05-17 (×4): qty 1

## 2021-05-17 MED ORDER — SERTRALINE HCL 50 MG PO TABS
50.0000 mg | ORAL_TABLET | Freq: Every day | ORAL | Status: DC
  Administered 2021-05-17 – 2021-05-20 (×4): 50 mg via ORAL
  Filled 2021-05-17 (×4): qty 1

## 2021-05-17 MED ORDER — ATORVASTATIN CALCIUM 40 MG PO TABS
80.0000 mg | ORAL_TABLET | Freq: Every day | ORAL | Status: DC
  Administered 2021-05-17 – 2021-05-20 (×4): 80 mg via ORAL
  Filled 2021-05-17 (×4): qty 2

## 2021-05-17 MED ORDER — TRAZODONE HCL 50 MG PO TABS
100.0000 mg | ORAL_TABLET | Freq: Every day | ORAL | Status: DC
  Administered 2021-05-17 – 2021-05-19 (×3): 100 mg via ORAL
  Filled 2021-05-17 (×3): qty 2

## 2021-05-17 MED ORDER — RISPERIDONE 1 MG PO TABS
3.0000 mg | ORAL_TABLET | Freq: Two times a day (BID) | ORAL | Status: DC
  Administered 2021-05-17 – 2021-05-20 (×7): 3 mg via ORAL
  Filled 2021-05-17 (×5): qty 3
  Filled 2021-05-17: qty 6
  Filled 2021-05-17: qty 3

## 2021-05-17 MED ORDER — HALOPERIDOL LACTATE 5 MG/ML IJ SOLN
5.0000 mg | Freq: Four times a day (QID) | INTRAMUSCULAR | Status: DC | PRN
  Administered 2021-05-17: 5 mg via INTRAVENOUS
  Filled 2021-05-17: qty 1

## 2021-05-17 MED ORDER — RISPERIDONE 1 MG PO TABS
1.0000 mg | ORAL_TABLET | Freq: Every day | ORAL | Status: DC
  Administered 2021-05-17 – 2021-05-19 (×3): 1 mg via ORAL
  Filled 2021-05-17 (×3): qty 1

## 2021-05-17 MED ORDER — TIOTROPIUM BROMIDE MONOHYDRATE 2.5 MCG/ACT IN AERS: 2.0000 | INHALATION_SPRAY | Freq: Every day | RESPIRATORY_TRACT | Status: DC

## 2021-05-17 MED ORDER — MELOXICAM 7.5 MG PO TABS
7.5000 mg | ORAL_TABLET | Freq: Every day | ORAL | Status: DC
  Administered 2021-05-17 – 2021-05-20 (×4): 7.5 mg via ORAL
  Filled 2021-05-17 (×4): qty 1

## 2021-05-17 MED ORDER — UMECLIDINIUM BROMIDE 62.5 MCG/INH IN AEPB
1.0000 | INHALATION_SPRAY | Freq: Every day | RESPIRATORY_TRACT | Status: DC
  Administered 2021-05-17 – 2021-05-20 (×4): 1 via RESPIRATORY_TRACT
  Filled 2021-05-17: qty 7

## 2021-05-17 MED ORDER — MOMETASONE FURO-FORMOTEROL FUM 200-5 MCG/ACT IN AERO
2.0000 | INHALATION_SPRAY | Freq: Two times a day (BID) | RESPIRATORY_TRACT | Status: DC
  Administered 2021-05-17 – 2021-05-20 (×6): 2 via RESPIRATORY_TRACT
  Filled 2021-05-17: qty 8.8

## 2021-05-17 MED ORDER — DIVALPROEX SODIUM 250 MG PO DR TAB
250.0000 mg | DELAYED_RELEASE_TABLET | Freq: Every day | ORAL | Status: DC
  Administered 2021-05-17 – 2021-05-19 (×3): 250 mg via ORAL
  Filled 2021-05-17 (×3): qty 1

## 2021-05-17 MED ORDER — ONDANSETRON HCL 4 MG PO TABS: 4.0000 mg | ORAL_TABLET | Freq: Four times a day (QID) | ORAL | Status: DC | PRN

## 2021-05-17 MED ORDER — INSULIN ASPART 100 UNIT/ML IJ SOLN: 0.0000 [IU] | Freq: Every day | INTRAMUSCULAR | Status: DC

## 2021-05-17 MED ORDER — POTASSIUM CHLORIDE CRYS ER 20 MEQ PO TBCR
20.0000 meq | EXTENDED_RELEASE_TABLET | Freq: Every day | ORAL | Status: DC
  Administered 2021-05-17 – 2021-05-20 (×4): 20 meq via ORAL
  Filled 2021-05-17 (×4): qty 1

## 2021-05-17 MED ORDER — INSULIN DETEMIR 100 UNIT/ML ~~LOC~~ SOLN
20.0000 [IU] | Freq: Every day | SUBCUTANEOUS | Status: DC
  Administered 2021-05-18 – 2021-05-19 (×2): 20 [IU] via SUBCUTANEOUS
  Filled 2021-05-17 (×4): qty 0.2

## 2021-05-17 MED ORDER — DIVALPROEX SODIUM ER 500 MG PO TB24
1000.0000 mg | ORAL_TABLET | Freq: Every evening | ORAL | Status: DC
  Administered 2021-05-17 – 2021-05-19 (×3): 1000 mg via ORAL
  Filled 2021-05-17 (×3): qty 2

## 2021-05-17 MED ORDER — TAMSULOSIN HCL 0.4 MG PO CAPS
0.4000 mg | ORAL_CAPSULE | Freq: Every day | ORAL | Status: DC
  Administered 2021-05-17 – 2021-05-20 (×4): 0.4 mg via ORAL
  Filled 2021-05-17 (×4): qty 1

## 2021-05-17 MED ORDER — APIXABAN 5 MG PO TABS
5.0000 mg | ORAL_TABLET | Freq: Two times a day (BID) | ORAL | Status: DC
  Administered 2021-05-17 – 2021-05-20 (×6): 5 mg via ORAL
  Filled 2021-05-17 (×7): qty 1

## 2021-05-17 MED ORDER — INSULIN ASPART 100 UNIT/ML IJ SOLN
0.0000 [IU] | Freq: Three times a day (TID) | INTRAMUSCULAR | Status: DC
  Administered 2021-05-17: 2 [IU] via SUBCUTANEOUS

## 2021-05-17 MED ORDER — GABAPENTIN 300 MG PO CAPS
900.0000 mg | ORAL_CAPSULE | Freq: Three times a day (TID) | ORAL | Status: DC
  Administered 2021-05-17 – 2021-05-20 (×10): 900 mg via ORAL
  Filled 2021-05-17 (×10): qty 3

## 2021-05-17 MED ORDER — SODIUM CHLORIDE 0.9 % IV SOLN
1.0000 g | INTRAVENOUS | Status: DC
  Administered 2021-05-17: 1 g via INTRAVENOUS

## 2021-05-17 MED ORDER — BENZTROPINE MESYLATE 1 MG PO TABS
0.5000 mg | ORAL_TABLET | Freq: Every day | ORAL | Status: DC
  Administered 2021-05-17 – 2021-05-20 (×4): 0.5 mg via ORAL
  Filled 2021-05-17 (×4): qty 1

## 2021-05-17 MED ORDER — ALBUTEROL SULFATE (2.5 MG/3ML) 0.083% IN NEBU: 3.0000 mL | INHALATION_SOLUTION | Freq: Four times a day (QID) | RESPIRATORY_TRACT | Status: DC | PRN

## 2021-05-17 MED ORDER — BACLOFEN 10 MG PO TABS
10.0000 mg | ORAL_TABLET | Freq: Every day | ORAL | Status: DC
  Administered 2021-05-17 – 2021-05-20 (×4): 10 mg via ORAL
  Filled 2021-05-17 (×4): qty 1

## 2021-05-17 NOTE — Progress Notes (Signed)
ANTICOAGULATION CONSULT NOTE - Initial Consult  Pharmacy Consult for Eliquis Indication: recurrent DVT, home med  Allergies  Allergen Reactions   Bactrim [Sulfamethoxazole-Trimethoprim] Other (See Comments)    ABD CRAMPS AND DIARRHEA   Oxycodone-Acetaminophen Other (See Comments)    Other reaction(s): Swelling   Remicade [Infliximab] Other (See Comments)    COULDN'T BREATHE   Codeine     REACTION: unknown reaction   Ibuprofen     REACTION: unknown reaction   Penicillins     REACTION: unknown reaction   Tramadol Hcl     REACTION: unknown reaction   Aspirin     REACTION: unknown reaction   Tramadol Nausea Only    Other reaction(s): Nausea   Vicodin [Hydrocodone-Acetaminophen] Nausea Only    Patient Measurements: Height: 5' 6"  (167.6 cm) Weight: 93.1 kg (205 lb 4 oz) IBW/kg (Calculated) : 63.8  Vital Signs: Temp: 99.1 F (37.3 C) (08/14 1428) Temp Source: Oral (08/14 1428) BP: 114/65 (08/14 1428) Pulse Rate: 82 (08/14 1428)  Labs: Recent Labs    05/16/21 1420 05/17/21 0421  HGB 9.0* 8.0*  HCT 25.1* 23.0*  PLT 102* 126*  CREATININE 0.92 0.68    Estimated Creatinine Clearance: 111.4 mL/min (by C-G formula based on SCr of 0.68 mg/dL).   Medical History: 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[place])    right, s/p replacement, due to prednisone   Bipolar disorder ([place])    Colostomy in place [facility])    Crohn's colitis ([place])    s/p total colectomy with ileostomy in 2009   Crohn's disease of small intestine [facility]) Sept 2012   ileoscopy: multiple ulcers likely secondary to [place], HX OF 02/11/2009   Qualifier: Diagnosis of  By: Zeb Comfort     Depression    DVT (deep venous thrombosis) ([place]) 2009   right upper extremity due to PICC   Enterococcus UTI 2009   GERD (gastroesophageal reflux disease)    Hernia    Hyperlipidemia    Insomnia    Migraine headache     Nystagmus    PULMONARY EMBOLISM, HX OF 02/11/2009   Qualifier: Diagnosis of  By: Zeb Comfort     S/P endoscopy Sept 2012   mild gastritis, small hiatal hernia, no ulcers   Schizophrenia ([place])    Schizophrenia, acute ([place])     Medications:  Medications Prior to Admission  Medication Sig Dispense Refill Last Dose   ACCU-CHEK AVIVA PLUS test strip SMARTSIG:Via Meter      albuterol (PROVENTIL) (2.5 MG/3ML) 0.083% nebulizer solution Take 2.5 mg by nebulization every 6 (six) hours as needed for wheezing or shortness of breath.   PRN   albuterol (VENTOLIN HFA) 108 (90 Base) MCG/ACT inhaler Inhale 2 puffs into the lungs every 6 (six) hours as needed for wheezing or shortness of breath.   PRN   atorvastatin (LIPITOR) 80 MG tablet Take 80 mg by mouth daily.   05/15/2021   azelastine (ASTELIN) 0.1 % nasal spray Place 2 sprays into both nostrils 2 (two) times daily.   Past Month   baclofen (LIORESAL) 10 MG tablet Take 10 mg by mouth daily.   Past Month   benztropine (COGENTIN) 0.5 MG tablet Take 0.5 mg by mouth daily.    05/15/2021   CALCIUM 600/VITAMIN D3 600-800 MG-UNIT TABS TAKE (1) TABLET BY MOUTH THREE TIMES DAILY AFTER MEALS. 84 tablet 5 05/15/2021  Cyanocobalamin (B-12) 1000 MCG CAPS Take 1 capsule by mouth daily.   05/15/2021   divalproex (DEPAKOTE) 250 MG DR tablet Take by mouth at bedtime.   05/15/2021   divalproex (DEPAKOTE) 500 MG 24 hr tablet Take 1,000 mg by mouth every evening.   05/15/2021   docusate sodium (COLACE) 100 MG capsule Take 1 capsule (100 mg total) by mouth 2 (two) times daily. (Patient taking differently: Take 100 mg by mouth 2 (two) times daily as needed for mild constipation.) 10 capsule 0 PRN   ELIQUIS 5 MG TABS tablet Take 5 mg by mouth 2 (two) times daily.   05/15/2021 at PM   FLOVENT HFA 110 MCG/ACT inhaler Inhale 2 puffs into the lungs 2 (two) times daily.   05/15/2021   furosemide (LASIX) 40 MG tablet Take 40 mg by mouth daily.   05/15/2021   gabapentin (NEURONTIN) 300  MG capsule Take 900 mg by mouth 3 (three) times daily.   05/15/2021   meloxicam (MOBIC) 7.5 MG tablet Take 7.5 mg by mouth daily.   05/15/2021   metFORMIN (GLUCOPHAGE-XR) 500 MG 24 hr tablet Take 1,000 mg by mouth 2 (two) times daily.   05/15/2021   metoprolol succinate (TOPROL-XL) 25 MG 24 hr tablet Take 1 tablet (25 mg total) by mouth daily. Take with or immediately following a meal. 30 tablet 3 05/15/2021 at AM   Misc. Devices (WALKER WHEELS) MISC USE AS DIRECTED 1 each 0    mometasone-formoterol (DULERA) 200-5 MCG/ACT AERO Inhale 2 puffs into the lungs in the morning and at bedtime. 13 g 5 05/15/2021   nitroGLYCERIN (NITROSTAT) 0.4 MG SL tablet Place 1 tablet (0.4 mg total) under the tongue every 5 (five) minutes as needed for chest pain. 25 tablet 3 PRN   Ostomy Supplies (ACTIVE LIFE 1-PC DRAIN [phone]) Pouch MISC       pantoprazole (PROTONIX) 40 MG tablet TAKE (1) TABLET BY MOUTH TWICE A DAY BEFORE MEALS. (BREAKFAST AND SUPPER) (Patient taking differently: Take 40 mg by mouth 2 (two) times daily before a meal.) 56 tablet 11 05/15/2021   potassium chloride SA (K-DUR,KLOR-CON) 20 MEQ tablet Take 20 mEq by mouth daily.    05/15/2021   risperiDONE (RISPERDAL) 1 MG tablet Take 1 tablet by mouth at bedtime.   05/15/2021   risperiDONE (RISPERDAL) 3 MG tablet Take 3 mg by mouth 2 (two) times daily.   05/15/2021   sertraline (ZOLOFT) 50 MG tablet Take 50 mg by mouth daily.   05/15/2021   tamsulosin (FLOMAX) 0.4 MG CAPS capsule Take 0.4 mg by mouth daily.   05/15/2021   Tiotropium Bromide Monohydrate (SPIRIVA RESPIMAT) 2.5 MCG/ACT AERS Inhale 2 puffs into the lungs daily. 4 g 5 05/15/2021   traZODone (DESYREL) 100 MG tablet Take 100 mg by mouth at bedtime.   05/15/2021   dicyclomine (BENTYL) 10 MG capsule Take 1 capsule (10 mg total) by mouth as needed prior to meals and at bedtime for abdominal cramping. Hold in the setting of constipation. (Patient not taking: No sig reported) 90 capsule 1 Not Taking     Assessment: 55yo male with recurrent DVTs on chronic Eliquis.  H/H low on admission.  Now asked to resume.  Goal of Therapy:   Monitor platelets by anticoagulation protocol: Yes   Plan:  Eliquis 106m po BID (home dose) Monitor CBC, s/sx bleeding complications  HHart RobinsonsA 05/17/2021,4:02 PM

## 2021-05-17 NOTE — Progress Notes (Signed)
PROGRESS NOTE    [name]  VOJ:[id number] DOB: 31-Aug-1966 DOA: 05/16/2021 PCP: [name], MD    Brief Narrative:  55 y.o. male with a history of bipolar disorder, asthma, colostomy in place, history of small bowel obstruction secondary to Crohn's disease, history of DVT on chronic anticoagulation, GERD, schizophrenia.  Patient was brought from home by EMS secondary to altered mental status, fever. Pt was found to be febrile and have evidence of UTI with sepsis  Assessment & Plan:   Principal Problem:   Acute metabolic encephalopathy Active Problems:   BIPOLAR AFFECTIVE DISORDER   Nystagmus   Crohn's disease of both small and large intestine with complication (HCC)   Acute lower UTI   BPH (benign prostatic hyperplasia)   Recurrent deep vein thrombosis (DVT) (HCC)   Hyponatremia   DM type 2 (diabetes mellitus, type 2) (HCC)  Acute toxic metabolic encephalopathy secondary to UTI Likely secondary to below UTI Alert but oriented to 0 this AM (stated his location was "ibuprofen" and the president is "ibuprofen." Not oriented to time either Agitated this AM.  Have ordered PRN haldol. EKG reviewed, normal QTc UTI with sepsis present on admission Presented with fever, encephalopathy, tachypena, in the setting of UTI Continue with empiric rocephin Blood cx pending, f/u on urine cx Repeat CBC in AM Hyponatremia Given IV fluids Sodium improved Repeat bmet in AM DM2 Hold metformin while in hospital Contiued with Levemir 20 units at night with SSI.  Bipolar, schizophrenia Continue home medications as tolerated History of DVT Continue on chronic anticoagulation    DVT prophylaxis: Eliquis Code Status: Full Family Communication: Pt in room, family not at bedside  Status is: Inpatient  Remains inpatient appropriate because:Inpatient level of care appropriate due to severity of illness  Dispo: The patient is from: Home              Anticipated d/c is to: Home               Patient currently is not medically stable to d/c.   Difficult to place patient No       Consultants:  Psychiatry  Procedures:    Antimicrobials: Anti-infectives (From admission, onward)    Start     Dose/Rate Route Frequency Ordered Stop   05/17/21 1700  cefTRIAXone (ROCEPHIN) 1 g in sodium chloride 0.9 % 100 mL IVPB        1 g 200 mL/hr over 30 Minutes Intravenous Every 24 hours 05/17/21 0213     05/16/21 1615  cefTRIAXone (ROCEPHIN) 1 g in sodium chloride 0.9 % 100 mL IVPB        1 g 200 mL/hr over 30 Minutes Intravenous  Once 05/16/21 1609 05/16/21 1910       Subjective: Confused. Wanting to go home, not oriented, unable to state why he is in hospital  Objective: Vitals:   05/17/21 0704 05/17/21 0725 05/17/21 1100 05/17/21 1428  BP: 113/63  115/62 114/65  Pulse: 88  85 82  Resp: 18  18 18   Temp: 99.6 F (37.6 C)  99.5 F (37.5 C) 99.1 F (37.3 C)  TempSrc: Oral  Oral Oral  SpO2: 97% 94% 95% 96%  Weight:      Height:        Intake/Output Summary (Last 24 hours) at 05/17/2021 1504 Last data filed at 05/17/2021 1300 Gross per 24 hour  Intake 1947.5 ml  Output 1850 ml  Net 97.5 ml   Filed Weights   05/16/21 1342  Weight: 93.1 kg    Examination: General exam: Awake, laying in bed, in nad Respiratory system: Normal respiratory effort, no wheezing Cardiovascular system: regular rate, s1, s2 Gastrointestinal system: Soft, nondistended, positive BS Central nervous system: CN2-12 grossly intact, strength intact Extremities: Perfused, no clubbing Skin: Normal skin turgor, no notable skin lesions seen Psychiatry: confused, difficult to assess   Data Reviewed: I have personally reviewed following labs and imaging studies  CBC: Recent Labs  Lab 05/11/21 1146 05/16/21 1420 05/17/21 0421  WBC 2.6* 5.2 5.2  NEUTROABS 1.4* 4.0  --   HGB 9.8* 9.0* 8.0*  HCT 28.4* 25.1* 23.0*  MCV 119.3* 118.4* 117.3*  PLT 83* 102* 654*   Basic Metabolic  Panel: Recent Labs  Lab 05/16/21 1420 05/17/21 0421  NA 128* 134*  K 3.9 3.8  CL 101 106  CO2 21* 21*  GLUCOSE 106* 92  BUN 15 14  CREATININE 0.92 0.68  CALCIUM 8.1* 7.7*   GFR: Estimated Creatinine Clearance: 111.4 mL/min (by C-G formula based on SCr of 0.68 mg/dL). Liver Function Tests: Recent Labs  Lab 05/16/21 1420  AST 35  ALT 33  ALKPHOS 65  BILITOT 1.8*  PROT 5.9*  ALBUMIN 2.9*   No results for input(s): LIPASE, AMYLASE in the last 168 hours. No results for input(s): AMMONIA in the last 168 hours. Coagulation Profile: No results for input(s): INR, PROTIME in the last 168 hours. Cardiac Enzymes: No results for input(s): CKTOTAL, CKMB, CKMBINDEX, TROPONINI in the last 168 hours. BNP (last 3 results) No results for input(s): PROBNP in the last 8760 hours. HbA1C: Recent Labs    05/17/21 0420  HGBA1C 4.8   CBG: Recent Labs  Lab 05/17/21 0226 05/17/21 0753 05/17/21 1124  GLUCAP 96 110* 122*   Lipid Profile: No results for input(s): CHOL, HDL, LDLCALC, TRIG, CHOLHDL, LDLDIRECT in the last 72 hours. Thyroid Function Tests: No results for input(s): TSH, T4TOTAL, FREET4, T3FREE, THYROIDAB in the last 72 hours. Anemia Panel: No results for input(s): VITAMINB12, FOLATE, FERRITIN, TIBC, IRON, RETICCTPCT in the last 72 hours. Sepsis Labs: No results for input(s): PROCALCITON, LATICACIDVEN in the last 168 hours.  Recent Results (from the past 240 hour(s))  Resp Panel by RT-PCR (Flu A&B, Covid) Nasopharyngeal Swab     Status: None   Collection Time: 05/16/21  2:11 PM   Specimen: Nasopharyngeal Swab; Nasopharyngeal(NP) swabs in vial transport medium  Result Value Ref Range Status   SARS Coronavirus 2 by RT PCR NEGATIVE NEGATIVE Final    Comment: (NOTE) SARS-CoV-2 target nucleic acids are NOT DETECTED.  The SARS-CoV-2 RNA is generally detectable in upper respiratory specimens during the acute phase of infection. The lowest concentration of SARS-CoV-2 viral  copies this assay can detect is 138 copies/mL. A negative result does not preclude SARS-Cov-2 infection and should not be used as the sole basis for treatment or other patient management decisions. A negative result may occur with  improper specimen collection/handling, submission of specimen other than nasopharyngeal swab, presence of viral mutation(s) within the areas targeted by this assay, and inadequate number of viral copies(<138 copies/mL). A negative result must be combined with clinical observations, patient history, and epidemiological information. The expected result is Negative.  Fact Sheet for Patients:  EntrepreneurPulse.com.au  Fact Sheet for Healthcare Providers:  IncredibleEmployment.be  This test is no t yet approved or cleared by the [place] FDA and  has been authorized for detection and/or diagnosis of SARS-CoV-2 by FDA under an Emergency Use Authorization (EUA). This EUA  will remain  in effect (meaning this test can be used) for the duration of the COVID-19 declaration under Section 564(b)(1) of the Act, 21 U.S.C.section 360bbb-3(b)(1), unless the authorization is terminated  or revoked sooner.       Influenza A by PCR NEGATIVE NEGATIVE Final   Influenza B by PCR NEGATIVE NEGATIVE Final    Comment: (NOTE) The Xpert Xpress SARS-CoV-2/FLU/RSV plus assay is intended as an aid in the diagnosis of influenza from Nasopharyngeal swab specimens and should not be used as a sole basis for treatment. Nasal washings and aspirates are unacceptable for Xpert Xpress SARS-CoV-2/FLU/RSV testing.  Fact Sheet for Patients: EntrepreneurPulse.com.au  Fact Sheet for Healthcare Providers: IncredibleEmployment.be  This test is not yet approved or cleared by the [place] FDA and has been authorized for detection and/or diagnosis of SARS-CoV-2 by FDA under an Emergency Use Authorization (EUA). This  EUA will remain in effect (meaning this test can be used) for the duration of the COVID-19 declaration under Section 564(b)(1) of the Act, 21 U.S.C. section 360bbb-3(b)(1), unless the authorization is terminated or revoked.  Performed at [facility], [address] [place], [place] [address]      Radiology Studies: DG Chest 1 View  Result Date: 05/16/2021 CLINICAL DATA:  55 year old male with a history of fever EXAM: CHEST  1 VIEW COMPARISON:  06/19/2020 FINDINGS: Cardiomediastinal silhouette unchanged in size and contour. Unchanged right IJ port catheter. No pneumothorax or pleural effusion. No confluent airspace disease. Coarsened interstitial markings similar to the prior. Surgical changes of bilateral glenohumeral joints unchanged. IMPRESSION: Negative for acute cardiopulmonary disease Electronically Signed   By: Corrie Mckusick D.O.   On: 05/16/2021 14:59   CT Head Wo Contrast  Result Date: 05/16/2021 CLINICAL DATA:  Delirium.  Confusion. EXAM: CT HEAD WITHOUT CONTRAST TECHNIQUE: Contiguous axial images were obtained from the base of the skull through the vertex without intravenous contrast. COMPARISON:  Head CT 05/14/2008 FINDINGS: Brain: No intracranial hemorrhage, mass effect, or midline shift. No hydrocephalus. The basilar cisterns are patent. No evidence of territorial infarct or acute ischemia. No extra-axial or intracranial fluid collection. Vascular: No hyperdense vessel or unexpected calcification. Skull: No fracture or focal lesion. Sinuses/Orbits: No acute findings. Paranasal sinuses and mastoid air cells are clear. Unremarkable orbits. Other: None. IMPRESSION: Negative noncontrast head CT. Electronically Signed   By: Keith Rake M.D.   On: 05/16/2021 15:52    Scheduled Meds:  atorvastatin  80 mg Oral Daily   baclofen  10 mg Oral Daily   benztropine  0.5 mg Oral Daily   divalproex  1,000 mg Oral QPM   divalproex  250 mg Oral QHS   gabapentin  900 mg Oral TID   insulin  aspart  0-15 Units Subcutaneous TID WC   insulin aspart  0-5 Units Subcutaneous QHS   insulin detemir  20 Units Subcutaneous QHS   meloxicam  7.5 mg Oral Daily   metoprolol succinate  25 mg Oral Daily   mometasone-formoterol  2 puff Inhalation BID   potassium chloride SA  20 mEq Oral Daily   risperiDONE  1 mg Oral QHS   risperiDONE  3 mg Oral BID   sertraline  50 mg Oral Daily   tamsulosin  0.4 mg Oral Daily   traZODone  100 mg Oral QHS   umeclidinium bromide  1 puff Inhalation Daily   Continuous Infusions:  sodium chloride 125 mL/hr at 05/17/21 0817   cefTRIAXone (ROCEPHIN)  IV  LOS: 1 day   Marylu Lund, MD [facility] Pager On Amion  If 7PM-7AM, please contact night-coverage 05/17/2021, 3:04 PM

## 2021-05-18 LAB — CBC
HCT: 23.8 % — ABNORMAL LOW (ref 39.0–52.0)
Hemoglobin: 8.1 g/dL — ABNORMAL LOW (ref 13.0–17.0)
MCH: 40.7 pg — ABNORMAL HIGH (ref 26.0–34.0)
MCHC: 34 g/dL (ref 30.0–36.0)
MCV: 119.6 fL — ABNORMAL HIGH (ref 80.0–100.0)
Platelets: 143 10*3/uL — ABNORMAL LOW (ref 150–400)
RBC: 1.99 MIL/uL — ABNORMAL LOW (ref 4.22–5.81)
RDW: 18 % — ABNORMAL HIGH (ref 11.5–15.5)
WBC: 4.3 10*3/uL (ref 4.0–10.5)
nRBC: 0 % (ref 0.0–0.2)

## 2021-05-18 LAB — COMPREHENSIVE METABOLIC PANEL
ALT: 30 U/L (ref 0–44)
AST: 29 U/L (ref 15–41)
Albumin: 2.3 g/dL — ABNORMAL LOW (ref 3.5–5.0)
Alkaline Phosphatase: 55 U/L (ref 38–126)
Anion gap: 7 (ref 5–15)
BUN: 13 mg/dL (ref 6–20)
CO2: 23 mmol/L (ref 22–32)
Calcium: 7.8 mg/dL — ABNORMAL LOW (ref 8.9–10.3)
Chloride: 110 mmol/L (ref 98–111)
Creatinine, Ser: 0.61 mg/dL (ref 0.61–1.24)
GFR, Estimated: >60 mL/min (ref >60)
Glucose, Bld: 84 mg/dL (ref 70–99)
Potassium: 3.9 mmol/L (ref 3.5–5.1)
Sodium: 140 mmol/L (ref 135–145)
Total Bilirubin: 1 mg/dL (ref 0.3–1.2)
Total Protein: 4.9 g/dL — ABNORMAL LOW (ref 6.5–8.1)

## 2021-05-18 LAB — GLUCOSE, CAPILLARY
Glucose-Capillary: 91 mg/dL (ref 70–99)
Glucose-Capillary: 101 mg/dL — ABNORMAL HIGH (ref 70–99)
Glucose-Capillary: 91 mg/dL (ref 70–99)
Glucose-Capillary: 87 mg/dL (ref 70–99)

## 2021-05-18 MED ORDER — ACETAMINOPHEN 325 MG PO TABS
650.0000 mg | ORAL_TABLET | Freq: Four times a day (QID) | ORAL | Status: DC | PRN
  Administered 2021-05-18: 650 mg via ORAL
  Filled 2021-05-18: qty 2

## 2021-05-18 MED ORDER — SODIUM CHLORIDE 0.9 % IV SOLN
2.0000 g | Freq: Three times a day (TID) | INTRAVENOUS | Status: DC
  Administered 2021-05-18 – 2021-05-20 (×6): 2 g via INTRAVENOUS
  Filled 2021-05-18 (×5): qty 2

## 2021-05-18 NOTE — Progress Notes (Signed)
PROGRESS NOTE    [name]  ZHG:[id number] DOB: Jun 10, 1966 DOA: 05/16/2021 PCP: [name], MD    Brief Narrative:  55 y.o. male with a history of bipolar disorder, asthma, colostomy in place, history of small bowel obstruction secondary to Crohn's disease, history of DVT on chronic anticoagulation, GERD, schizophrenia.  Patient was brought from home by EMS secondary to altered mental status, fever. Pt was found to be febrile and have evidence of UTI with sepsis  Assessment & Plan:   Principal Problem:   Acute metabolic encephalopathy Active Problems:   BIPOLAR AFFECTIVE DISORDER   Nystagmus   Crohn's disease of both small and large intestine with complication (HCC)   Acute lower UTI   BPH (benign prostatic hyperplasia)   Recurrent deep vein thrombosis (DVT) (HCC)   Hyponatremia   DM type 2 (diabetes mellitus, type 2) (HCC)  Acute toxic metabolic encephalopathy secondary to UTI Likely secondary to below UTI Now alert and oriented x 3, has insight to current care resolved Pseudomonas UTI with sepsis present on admission Presented with fever, encephalopathy, tachypena, in the setting of UTI Initially continued with empiric rocephin Urine cx pos for psudomonas, abx changed to cefepime Pending sensitivities Hyponatremia Given IV fluids Resolved Repeat bmet in AM DM2 Hold metformin while in hospital Contiued with Levemir 20 units at night with SSI.  Glycemic trends are stable Bipolar, schizophrenia Continue home medications as tolerated History of DVT Continue on chronic anticoagulation    DVT prophylaxis: Eliquis Code Status: Full Family Communication: Pt in room, family not at bedside  Status is: Inpatient  Remains inpatient appropriate because:Inpatient level of care appropriate due to severity of illness  Dispo: The patient is from: Home              Anticipated d/c is to: Home              Patient currently is not medically stable to d/c.    Difficult to place patient No   Consultants:  Psychiatry  Procedures:    Antimicrobials: Anti-infectives (From admission, onward)    Start     Dose/Rate Route Frequency Ordered Stop   05/18/21 1045  ceFEPIme (MAXIPIME) 2 g in sodium chloride 0.9 % 100 mL IVPB        2 g 200 mL/hr over 30 Minutes Intravenous Every 8 hours 05/18/21 1036     05/17/21 1700  cefTRIAXone (ROCEPHIN) 1 g in sodium chloride 0.9 % 100 mL IVPB  Status:  Discontinued        1 g 200 mL/hr over 30 Minutes Intravenous Every 24 hours 05/17/21 0213 05/18/21 1036   05/16/21 1615  cefTRIAXone (ROCEPHIN) 1 g in sodium chloride 0.9 % 100 mL IVPB        1 g 200 mL/hr over 30 Minutes Intravenous  Once 05/16/21 1609 05/16/21 1910       Subjective: Feels much better. More oriented  Objective: Vitals:   05/17/21 1901 05/18/21 0508 05/18/21 0721 05/18/21 1416  BP: (!) 103/57 (!) 89/64  (!) 100/55  Pulse: 76 98  83  Resp: 18 18  18   Temp: 98.9 F (37.2 C) 100.2 F (37.9 C)  97.8 F (36.6 C)  TempSrc: Oral Oral  Oral  SpO2: 99% 99% 98% 100%  Weight:      Height:        Intake/Output Summary (Last 24 hours) at 05/18/2021 1722 Last data filed at 05/18/2021 1700 Gross per 24 hour  Intake 710 ml  Output  2401 ml  Net -1691 ml    Filed Weights   05/16/21 1342  Weight: 93.1 kg    Examination: General exam: Conversant, in no acute distress Respiratory system: normal chest rise, clear, no audible wheezing Cardiovascular system: regular rhythm, s1-s2 Gastrointestinal system: Nondistended, nontender, pos BS Central nervous system: No seizures, no tremors Extremities: No cyanosis, no joint deformities Skin: No rashes, no pallor Psychiatry: Affect normal // no auditory hallucinations   Data Reviewed: I have personally reviewed following labs and imaging studies  CBC: Recent Labs  Lab 05/16/21 1420 05/17/21 0421 05/18/21 0653  WBC 5.2 5.2 4.3  NEUTROABS 4.0  --   --   HGB 9.0* 8.0* 8.1*  HCT 25.1*  23.0* 23.8*  MCV 118.4* 117.3* 119.6*  PLT 102* 126* 143*    Basic Metabolic Panel: Recent Labs  Lab 05/16/21 1420 05/17/21 0421 05/18/21 0653  NA 128* 134* 140  K 3.9 3.8 3.9  CL 101 106 110  CO2 21* 21* 23  GLUCOSE 106* 92 84  BUN 15 14 13   CREATININE 0.92 0.68 0.61  CALCIUM 8.1* 7.7* 7.8*    GFR: Estimated Creatinine Clearance: 111.4 mL/min (by C-G formula based on SCr of 0.61 mg/dL). Liver Function Tests: Recent Labs  Lab 05/16/21 1420 05/18/21 0653  AST 35 29  ALT 33 30  ALKPHOS 65 55  BILITOT 1.8* 1.0  PROT 5.9* 4.9*  ALBUMIN 2.9* 2.3*    No results for input(s): LIPASE, AMYLASE in the last 168 hours. No results for input(s): AMMONIA in the last 168 hours. Coagulation Profile: No results for input(s): INR, PROTIME in the last 168 hours. Cardiac Enzymes: No results for input(s): CKTOTAL, CKMB, CKMBINDEX, TROPONINI in the last 168 hours. BNP (last 3 results) No results for input(s): PROBNP in the last 8760 hours. HbA1C: Recent Labs    05/17/21 0420  HGBA1C 4.8    CBG: Recent Labs  Lab 05/17/21 1613 05/17/21 2142 05/18/21 0734 05/18/21 1125 05/18/21 1612  GLUCAP 96 97 91 101* 91    Lipid Profile: No results for input(s): CHOL, HDL, LDLCALC, TRIG, CHOLHDL, LDLDIRECT in the last 72 hours. Thyroid Function Tests: No results for input(s): TSH, T4TOTAL, FREET4, T3FREE, THYROIDAB in the last 72 hours. Anemia Panel: No results for input(s): VITAMINB12, FOLATE, FERRITIN, TIBC, IRON, RETICCTPCT in the last 72 hours. Sepsis Labs: No results for input(s): PROCALCITON, LATICACIDVEN in the last 168 hours.  Recent Results (from the past 240 hour(s))  Resp Panel by RT-PCR (Flu A&B, Covid) Nasopharyngeal Swab     Status: None   Collection Time: 05/16/21  2:11 PM   Specimen: Nasopharyngeal Swab; Nasopharyngeal(NP) swabs in vial transport medium  Result Value Ref Range Status   SARS Coronavirus 2 by RT PCR NEGATIVE NEGATIVE Final    Comment:  (NOTE) SARS-CoV-2 target nucleic acids are NOT DETECTED.  The SARS-CoV-2 RNA is generally detectable in upper respiratory specimens during the acute phase of infection. The lowest concentration of SARS-CoV-2 viral copies this assay can detect is 138 copies/mL. A negative result does not preclude SARS-Cov-2 infection and should not be used as the sole basis for treatment or other patient management decisions. A negative result may occur with  improper specimen collection/handling, submission of specimen other than nasopharyngeal swab, presence of viral mutation(s) within the areas targeted by this assay, and inadequate number of viral copies(<138 copies/mL). A negative result must be combined with clinical observations, patient history, and epidemiological information. The expected result is Negative.  Fact Sheet for Patients:  EntrepreneurPulse.com.au  Fact Sheet for Healthcare Providers:  IncredibleEmployment.be  This test is no t yet approved or cleared by the [place] FDA and  has been authorized for detection and/or diagnosis of SARS-CoV-2 by FDA under an Emergency Use Authorization (EUA). This EUA will remain  in effect (meaning this test can be used) for the duration of the COVID-19 declaration under Section 564(b)(1) of the Act, 21 U.S.C.section 360bbb-3(b)(1), unless the authorization is terminated  or revoked sooner.       Influenza A by PCR NEGATIVE NEGATIVE Final   Influenza B by PCR NEGATIVE NEGATIVE Final    Comment: (NOTE) The Xpert Xpress SARS-CoV-2/FLU/RSV plus assay is intended as an aid in the diagnosis of influenza from Nasopharyngeal swab specimens and should not be used as a sole basis for treatment. Nasal washings and aspirates are unacceptable for Xpert Xpress SARS-CoV-2/FLU/RSV testing.  Fact Sheet for Patients: EntrepreneurPulse.com.au  Fact Sheet for Healthcare  Providers: IncredibleEmployment.be  This test is not yet approved or cleared by the [place] FDA and has been authorized for detection and/or diagnosis of SARS-CoV-2 by FDA under an Emergency Use Authorization (EUA). This EUA will remain in effect (meaning this test can be used) for the duration of the COVID-19 declaration under Section 564(b)(1) of the Act, 21 U.S.C. section 360bbb-3(b)(1), unless the authorization is terminated or revoked.  Performed at [facility], [address] [place], [place] [address]   Culture, blood (routine x 2)     Status: None (Preliminary result)   Collection Time: 05/16/21  2:21 PM   Specimen: BLOOD RIGHT ARM  Result Value Ref Range Status   Specimen Description BLOOD RIGHT ARM  Final   Special Requests   Final    BOTTLES DRAWN AEROBIC AND ANAEROBIC Blood Culture adequate volume   Culture   Final    NO GROWTH 2 DAYS Performed at [facility], [address], [place], [place] [address]    Report Status PENDING  Incomplete  Urine Culture     Status: Abnormal (Preliminary result)   Collection Time: 05/16/21  4:10 PM   Specimen: Urine, Clean Catch  Result Value Ref Range Status   Specimen Description   Final    URINE, CLEAN CATCH Performed at [facility], [address] [place], [place] [address]    Special Requests   Final    NONE Performed at [facility], [address] [place], [place] [address]    Culture (A)  Final    >=100,000 COLONIES/mL PSEUDOMONAS AERUGINOSA SUSCEPTIBILITIES TO FOLLOW Performed at [facility], [place] [address], [place], [address] [address]    Report Status PENDING  Incomplete  Culture, blood (routine x 2)     Status: None (Preliminary result)   Collection Time: 05/16/21 10:29 PM   Specimen: BLOOD LEFT ARM  Result Value Ref Range Status   Specimen Description BLOOD LEFT ARM  Final   Special Requests   Final    BOTTLES DRAWN AEROBIC AND ANAEROBIC Blood Culture adequate volume   Culture    Final    NO GROWTH 2 DAYS Performed at [facility], [address] [place], [place] [address]    Report Status PENDING  Incomplete      Radiology Studies: No results found.  Scheduled Meds:  apixaban  5 mg Oral BID   atorvastatin  80 mg Oral Daily   baclofen  10 mg Oral Daily   benztropine  0.5 mg Oral Daily   divalproex  1,000 mg Oral  QPM   divalproex  250 mg Oral QHS   gabapentin  900 mg Oral TID   insulin aspart  0-15 Units Subcutaneous TID WC   insulin aspart  0-5 Units Subcutaneous QHS   insulin detemir  20 Units Subcutaneous QHS   meloxicam  7.5 mg Oral Daily   metoprolol succinate  25 mg Oral Daily   mometasone-formoterol  2 puff Inhalation BID   potassium chloride SA  20 mEq Oral Daily   risperiDONE  1 mg Oral QHS   risperiDONE  3 mg Oral BID   sertraline  50 mg Oral Daily   tamsulosin  0.4 mg Oral Daily   traZODone  100 mg Oral QHS   umeclidinium bromide  1 puff Inhalation Daily   Continuous Infusions:  sodium chloride 125 mL/hr at 05/18/21 0523   ceFEPime (MAXIPIME) IV 2 g (05/18/21 1338)     LOS: 2 days   Marylu Lund, MD [facility] Pager On Amion  If 7PM-7AM, please contact night-coverage 05/18/2021, 5:22 PM

## 2021-05-19 ENCOUNTER — Other Ambulatory Visit: Payer: Self-pay

## 2021-05-19 LAB — GLUCOSE, CAPILLARY
Glucose-Capillary: 74 mg/dL (ref 70–99)
Glucose-Capillary: 96 mg/dL (ref 70–99)
Glucose-Capillary: 74 mg/dL (ref 70–99)
Glucose-Capillary: 109 mg/dL — ABNORMAL HIGH (ref 70–99)

## 2021-05-19 NOTE — Plan of Care (Signed)

## 2021-05-19 NOTE — Progress Notes (Signed)
PROGRESS NOTE    [name]  QMV:[id number] DOB: 12-Jun-1966 DOA: 05/16/2021 PCP: [name], MD    Brief Narrative:  55 y.o. male with a history of bipolar disorder, asthma, colostomy in place, history of small bowel obstruction secondary to Crohn's disease, history of DVT on chronic anticoagulation, GERD, schizophrenia.  Patient was brought from home by EMS secondary to altered mental status, fever. Pt was found to be febrile and have evidence of UTI with sepsis  Assessment & Plan:   Principal Problem:   Acute metabolic encephalopathy Active Problems:   BIPOLAR AFFECTIVE DISORDER   Nystagmus   Crohn's disease of both small and large intestine with complication (HCC)   Acute lower UTI   BPH (benign prostatic hyperplasia)   Recurrent deep vein thrombosis (DVT) (HCC)   Hyponatremia   DM type 2 (diabetes mellitus, type 2) (HCC)  Acute toxic metabolic encephalopathy secondary to Pseudomonas UTI Likely secondary to below UTI Now alert and oriented x 3, has insight to current care Mentation has normalized Pseudomonas UTI with sepsis present on admission Presented with fever, encephalopathy, tachypena, in the setting of UTI Initially continued with empiric rocephin Urine cx pos for psudomonas, abx changed to cefepime Still awaiting sensitivities Hyponatremia Given IV fluids Resolved Recheck bmet in AM DM2 Hold metformin while in hospital Contiued with Levemir 20 units at night with SSI.  Glycemic trends have remained stable Bipolar, schizophrenia Continue home medications as tolerated History of DVT Continue on chronic anticoagulation No evidence of acute blood loss    DVT prophylaxis: Eliquis Code Status: Full Family Communication: Pt in room, family not at bedside  Status is: Inpatient  Remains inpatient appropriate because:Inpatient level of care appropriate due to severity of illness  Dispo: The patient is from: Home              Anticipated d/c is to:  Home              Patient currently is not medically stable to d/c.   Difficult to place patient No   Consultants:  Psychiatry  Procedures:    Antimicrobials: Anti-infectives (From admission, onward)    Start     Dose/Rate Route Frequency Ordered Stop   05/18/21 1045  ceFEPIme (MAXIPIME) 2 g in sodium chloride 0.9 % 100 mL IVPB        2 g 200 mL/hr over 30 Minutes Intravenous Every 8 hours 05/18/21 1036     05/17/21 1700  cefTRIAXone (ROCEPHIN) 1 g in sodium chloride 0.9 % 100 mL IVPB  Status:  Discontinued        1 g 200 mL/hr over 30 Minutes Intravenous Every 24 hours 05/17/21 0213 05/18/21 1036   05/16/21 1615  cefTRIAXone (ROCEPHIN) 1 g in sodium chloride 0.9 % 100 mL IVPB        1 g 200 mL/hr over 30 Minutes Intravenous  Once 05/16/21 1609 05/16/21 1910       Subjective: States feeling better. Eager to go home soon  Objective: Vitals:   05/18/21 2022 05/18/21 2044 05/19/21 0454 05/19/21 0747  BP:  119/61 124/65   Pulse:  72 88   Resp:  15 15   Temp:  98.1 F (36.7 C) 98.3 F (36.8 C)   TempSrc:  Oral Oral   SpO2: 98% 100% 100% 98%  Weight:      Height:        Intake/Output Summary (Last 24 hours) at 05/19/2021 1535 Last data filed at 05/19/2021 0900 Gross per  24 hour  Intake 4146.22 ml  Output 1550 ml  Net 2596.22 ml    Filed Weights   05/16/21 1342  Weight: 93.1 kg    Examination: General exam: Awake, laying in bed, in nad Respiratory system: Normal respiratory effort, no wheezing Cardiovascular system: regular rate, s1, s2 Gastrointestinal system: Soft, nondistended, positive BS Central nervous system: CN2-12 grossly intact, strength intact Extremities: Perfused, no clubbing Skin: Normal skin turgor, no notable skin lesions seen Psychiatry: Mood normal // no visual hallucinations   Data Reviewed: I have personally reviewed following labs and imaging studies  CBC: Recent Labs  Lab 05/16/21 1420 05/17/21 0421 05/18/21 0653  WBC 5.2 5.2  4.3  NEUTROABS 4.0  --   --   HGB 9.0* 8.0* 8.1*  HCT 25.1* 23.0* 23.8*  MCV 118.4* 117.3* 119.6*  PLT 102* 126* 143*    Basic Metabolic Panel: Recent Labs  Lab 05/16/21 1420 05/17/21 0421 05/18/21 0653  NA 128* 134* 140  K 3.9 3.8 3.9  CL 101 106 110  CO2 21* 21* 23  GLUCOSE 106* 92 84  BUN 15 14 13   CREATININE 0.92 0.68 0.61  CALCIUM 8.1* 7.7* 7.8*    GFR: Estimated Creatinine Clearance: 111.4 mL/min (by C-G formula based on SCr of 0.61 mg/dL). Liver Function Tests: Recent Labs  Lab 05/16/21 1420 05/18/21 0653  AST 35 29  ALT 33 30  ALKPHOS 65 55  BILITOT 1.8* 1.0  PROT 5.9* 4.9*  ALBUMIN 2.9* 2.3*    No results for input(s): LIPASE, AMYLASE in the last 168 hours. No results for input(s): AMMONIA in the last 168 hours. Coagulation Profile: No results for input(s): INR, PROTIME in the last 168 hours. Cardiac Enzymes: No results for input(s): CKTOTAL, CKMB, CKMBINDEX, TROPONINI in the last 168 hours. BNP (last 3 results) No results for input(s): PROBNP in the last 8760 hours. HbA1C: Recent Labs    05/17/21 0420  HGBA1C 4.8    CBG: Recent Labs  Lab 05/18/21 1125 05/18/21 1612 05/18/21 2229 05/19/21 0743 05/19/21 1108  GLUCAP 101* 91 87 74 96    Lipid Profile: No results for input(s): CHOL, HDL, LDLCALC, TRIG, CHOLHDL, LDLDIRECT in the last 72 hours. Thyroid Function Tests: No results for input(s): TSH, T4TOTAL, FREET4, T3FREE, THYROIDAB in the last 72 hours. Anemia Panel: No results for input(s): VITAMINB12, FOLATE, FERRITIN, TIBC, IRON, RETICCTPCT in the last 72 hours. Sepsis Labs: No results for input(s): PROCALCITON, LATICACIDVEN in the last 168 hours.  Recent Results (from the past 240 hour(s))  Resp Panel by RT-PCR (Flu A&B, Covid) Nasopharyngeal Swab     Status: None   Collection Time: 05/16/21  2:11 PM   Specimen: Nasopharyngeal Swab; Nasopharyngeal(NP) swabs in vial transport medium  Result Value Ref Range Status   SARS Coronavirus  2 by RT PCR NEGATIVE NEGATIVE Final    Comment: (NOTE) SARS-CoV-2 target nucleic acids are NOT DETECTED.  The SARS-CoV-2 RNA is generally detectable in upper respiratory specimens during the acute phase of infection. The lowest concentration of SARS-CoV-2 viral copies this assay can detect is 138 copies/mL. A negative result does not preclude SARS-Cov-2 infection and should not be used as the sole basis for treatment or other patient management decisions. A negative result may occur with  improper specimen collection/handling, submission of specimen other than nasopharyngeal swab, presence of viral mutation(s) within the areas targeted by this assay, and inadequate number of viral copies(<138 copies/mL). A negative result must be combined with clinical observations, patient history, and epidemiological  information. The expected result is Negative.  Fact Sheet for Patients:  EntrepreneurPulse.com.au  Fact Sheet for Healthcare Providers:  IncredibleEmployment.be  This test is no t yet approved or cleared by the [place] FDA and  has been authorized for detection and/or diagnosis of SARS-CoV-2 by FDA under an Emergency Use Authorization (EUA). This EUA will remain  in effect (meaning this test can be used) for the duration of the COVID-19 declaration under Section 564(b)(1) of the Act, 21 U.S.C.section 360bbb-3(b)(1), unless the authorization is terminated  or revoked sooner.       Influenza A by PCR NEGATIVE NEGATIVE Final   Influenza B by PCR NEGATIVE NEGATIVE Final    Comment: (NOTE) The Xpert Xpress SARS-CoV-2/FLU/RSV plus assay is intended as an aid in the diagnosis of influenza from Nasopharyngeal swab specimens and should not be used as a sole basis for treatment. Nasal washings and aspirates are unacceptable for Xpert Xpress SARS-CoV-2/FLU/RSV testing.  Fact Sheet for Patients: EntrepreneurPulse.com.au  Fact  Sheet for Healthcare Providers: IncredibleEmployment.be  This test is not yet approved or cleared by the [place] FDA and has been authorized for detection and/or diagnosis of SARS-CoV-2 by FDA under an Emergency Use Authorization (EUA). This EUA will remain in effect (meaning this test can be used) for the duration of the COVID-19 declaration under Section 564(b)(1) of the Act, 21 U.S.C. section 360bbb-3(b)(1), unless the authorization is terminated or revoked.  Performed at [facility], [address], [place], [place] [address]   Culture, blood (routine x 2)     Status: None (Preliminary result)   Collection Time: 05/16/21  2:21 PM   Specimen: BLOOD RIGHT ARM  Result Value Ref Range Status   Specimen Description BLOOD RIGHT ARM  Final   Special Requests   Final    BOTTLES DRAWN AEROBIC AND ANAEROBIC Blood Culture adequate volume   Culture   Final    NO GROWTH 3 DAYS Performed at [facility], [address], [place], [place] [address]    Report Status PENDING  Incomplete  Urine Culture     Status: Abnormal (Preliminary result)   Collection Time: 05/16/21  4:10 PM   Specimen: Urine, Clean Catch  Result Value Ref Range Status   Specimen Description   Final    URINE, CLEAN CATCH Performed at [facility], [address] [place], [place] [address]    Special Requests   Final    NONE Performed at [facility], [address] [place], [place] [address]    Culture (A)  Final    >=100,000 COLONIES/mL PSEUDOMONAS AERUGINOSA SUSCEPTIBILITIES TO FOLLOW Performed at [facility], [place] [address], [place], [place] [address]    Report Status PENDING  Incomplete  Culture, blood (routine x 2)     Status: None (Preliminary result)   Collection Time: 05/16/21 10:29 PM   Specimen: BLOOD LEFT ARM  Result Value Ref Range Status   Specimen Description BLOOD LEFT ARM  Final   Special Requests   Final    BOTTLES DRAWN AEROBIC AND ANAEROBIC Blood Culture adequate  volume   Culture   Final    NO GROWTH 3 DAYS Performed at [facility], [address] [place], [place] [address]    Report Status PENDING  Incomplete      Radiology Studies: No results found.  Scheduled Meds:  apixaban  5 mg Oral BID   atorvastatin  80 mg Oral Daily   baclofen  10 mg Oral Daily   benztropine  0.5 mg Oral Daily   divalproex  1,000 mg Oral QPM   divalproex  250 mg Oral QHS   gabapentin  900 mg Oral TID   insulin aspart  0-15 Units Subcutaneous TID WC   insulin aspart  0-5 Units Subcutaneous QHS   insulin detemir  20 Units Subcutaneous QHS   meloxicam  7.5 mg Oral Daily   metoprolol succinate  25 mg Oral Daily   mometasone-formoterol  2 puff Inhalation BID   potassium chloride SA  20 mEq Oral Daily   risperiDONE  1 mg Oral QHS   risperiDONE  3 mg Oral BID   sertraline  50 mg Oral Daily   tamsulosin  0.4 mg Oral Daily   traZODone  100 mg Oral QHS   umeclidinium bromide  1 puff Inhalation Daily   Continuous Infusions:  sodium chloride 125 mL/hr at 05/19/21 0642   ceFEPime (MAXIPIME) IV 2 g (05/19/21 1341)     LOS: 3 days   Marylu Lund, MD [facility] Pager On Amion  If 7PM-7AM, please contact night-coverage 05/19/2021, 3:35 PM

## 2021-05-20 LAB — URINE CULTURE: Culture: >=100000 — AB

## 2021-05-20 LAB — GLUCOSE, CAPILLARY
Glucose-Capillary: 111 mg/dL — ABNORMAL HIGH (ref 70–99)
Glucose-Capillary: 66 mg/dL — ABNORMAL LOW (ref 70–99)
Glucose-Capillary: 95 mg/dL (ref 70–99)

## 2021-05-20 MED ORDER — CIPROFLOXACIN HCL 250 MG PO TABS
500.0000 mg | ORAL_TABLET | Freq: Two times a day (BID) | ORAL | Status: DC
  Administered 2021-05-20: 500 mg via ORAL
  Filled 2021-05-20: qty 2

## 2021-05-20 MED ORDER — CIPROFLOXACIN HCL 500 MG PO TABS: 500.0000 mg | ORAL_TABLET | Freq: Two times a day (BID) | ORAL | 0 refills | Status: DC

## 2021-05-20 NOTE — Discharge Summary (Signed)
Physician Discharge Summary  [name] PXT:[id number] DOB: 28-Oct-1965 DOA: 05/16/2021  PCP: [name], MD  Admit date: 05/16/2021 Discharge date: 05/20/2021  Admitted From: Home Disposition: Home   Recommendations for Outpatient Follow-up:  Follow up with PCP in 1-2 weeks Please obtain BMP/CBC in one week  Home Health: None Equipment/Devices: None Discharge Condition: Stable CODE STATUS: Full Diet recommendation: Heart healthy, carb-modified  Brief/Interim Summary: [name] is a 55 y.o. male with a history of bipolar disorder, asthma, colostomy in place, history of small bowel obstruction secondary to Crohn's disease, history of DVT on chronic anticoagulation, GERD, schizophrenia.  Patient was brought from home by EMS secondary to altered mental status, fever. Pt was found to be febrile and have evidence of UTI with sepsis which improved with antibiotics. Urine culture grew Pseudomonas, so susceptibilities were confirmed prior to discharge.   Discharge Diagnoses:  Principal Problem:   Acute metabolic encephalopathy Active Problems:   BIPOLAR AFFECTIVE DISORDER   Nystagmus   Crohn's disease of both small and large intestine with complication (HCC)   Acute lower UTI   BPH (benign prostatic hyperplasia)   Recurrent deep vein thrombosis (DVT) (HCC)   Hyponatremia   DM type 2 (diabetes mellitus, type 2) (HCC)  Acute toxic metabolic encephalopathy secondary to Pseudomonas UTI. Resolved. with antibiotics.  Pseudomonas UTI with sepsis present on admission: CTX converted to cefepime once culture results returned. Susceptible to fluoroquinolone, so can be treated as outpatient. Minimizing duration of FQ usage. Counseled on side effects and known risks of this medication.  Hyponatremia: Resolved.  DM2 Continue home medications  Bipolar, schizophrenia Continue home medications  Obesity: Body mass index is 33.13 kg/m.  History of DVT Continue on chronic  anticoagulation  Discharge Instructions Discharge Instructions     Diet Carb Modified   Complete by: As directed    Discharge instructions   Complete by: As directed    You were admitted for a UTI due to Pseudomonas. This is sensitive to the oral antibiotic cipro which you have tolerated prior to discharge. Please take the next dose this evening, and continue twice daily until the pills are gone. This medication can have some adverse effects including neurological and musculoskeletal conditions, though this is the only oral medication this bacteria is susceptible to, so our options are very limited. We will treat for the shortest possible duration. If your symptoms return, seek medical attention right away.   Increase activity slowly   Complete by: As directed       Allergies as of 05/20/2021       Reactions   Bactrim [sulfamethoxazole-trimethoprim] Other (See Comments)   ABD CRAMPS AND DIARRHEA   Oxycodone-acetaminophen Other (See Comments)   Other reaction(s): Swelling   Remicade [infliximab] Other (See Comments)   COULDN'T BREATHE   Codeine    REACTION: unknown reaction   Ibuprofen    REACTION: unknown reaction   Penicillins    REACTION: unknown reaction   Tramadol Hcl    REACTION: unknown reaction   Aspirin    REACTION: unknown reaction   Tramadol Nausea Only   Other reaction(s): Nausea   Vicodin [hydrocodone-acetaminophen] Nausea Only        Medication List     TAKE these medications    Accu-Chek Aviva Plus test strip Generic drug: glucose blood SMARTSIG:Via Meter   Active Life 1-Pc Drain 19-64mm Pouch Misc   albuterol (2.5 MG/3ML) 0.083% nebulizer solution Commonly known as: PROVENTIL Take 2.5 mg by nebulization every 6 (six)  hours as needed for wheezing or shortness of breath.   albuterol 108 (90 Base) MCG/ACT inhaler Commonly known as: VENTOLIN HFA Inhale 2 puffs into the lungs every 6 (six) hours as needed for wheezing or shortness of breath.    atorvastatin 80 MG tablet Commonly known as: LIPITOR Take 80 mg by mouth daily.   azelastine 0.1 % nasal spray Commonly known as: ASTELIN Place 2 sprays into both nostrils 2 (two) times daily.   B-12 1000 MCG Caps Take 1 capsule by mouth daily.   baclofen 10 MG tablet Commonly known as: LIORESAL Take 10 mg by mouth daily.   benztropine 0.5 MG tablet Commonly known as: COGENTIN Take 0.5 mg by mouth daily.   Calcium 600/Vitamin D3 600-800 MG-UNIT Tabs Generic drug: Calcium Carb-Cholecalciferol TAKE (1) TABLET BY MOUTH THREE TIMES DAILY AFTER MEALS.   ciprofloxacin 500 MG tablet Commonly known as: CIPRO Take 1 tablet (500 mg total) by mouth 2 (two) times daily.   dicyclomine 10 MG capsule Commonly known as: BENTYL Take 1 capsule (10 mg total) by mouth as needed prior to meals and at bedtime for abdominal cramping. Hold in the setting of constipation.   divalproex 500 MG 24 hr tablet Commonly known as: DEPAKOTE ER Take 1,000 mg by mouth every evening.   divalproex 250 MG DR tablet Commonly known as: DEPAKOTE Take by mouth at bedtime.   docusate sodium 100 MG capsule Commonly known as: COLACE Take 1 capsule (100 mg total) by mouth 2 (two) times daily. What changed:  when to take this reasons to take this   Dulera 200-5 MCG/ACT Aero Generic drug: mometasone-formoterol Inhale 2 puffs into the lungs in the morning and at bedtime.   Eliquis 5 MG Tabs tablet Generic drug: apixaban Take 5 mg by mouth 2 (two) times daily.   Flovent HFA 110 MCG/ACT inhaler Generic drug: fluticasone Inhale 2 puffs into the lungs 2 (two) times daily.   furosemide 40 MG tablet Commonly known as: LASIX Take 40 mg by mouth daily.   gabapentin 300 MG capsule Commonly known as: NEURONTIN Take 900 mg by mouth 3 (three) times daily.   meloxicam 7.5 MG tablet Commonly known as: MOBIC Take 7.5 mg by mouth daily.   metFORMIN 500 MG 24 hr tablet Commonly known as: GLUCOPHAGE-XR Take  1,000 mg by mouth 2 (two) times daily.   metoprolol succinate 25 MG 24 hr tablet Commonly known as: TOPROL-XL Take 1 tablet (25 mg total) by mouth daily. Take with or immediately following a meal.   nitroGLYCERIN 0.4 MG SL tablet Commonly known as: NITROSTAT Place 1 tablet (0.4 mg total) under the tongue every 5 (five) minutes as needed for chest pain.   pantoprazole 40 MG tablet Commonly known as: PROTONIX TAKE (1) TABLET BY MOUTH TWICE A DAY BEFORE MEALS. (BREAKFAST AND SUPPER) What changed: See the new instructions.   potassium chloride SA 20 MEQ tablet Commonly known as: KLOR-CON Take 20 mEq by mouth daily.   risperiDONE 1 MG tablet Commonly known as: RISPERDAL Take 1 tablet by mouth at bedtime.   risperiDONE 3 MG tablet Commonly known as: RISPERDAL Take 3 mg by mouth 2 (two) times daily.   sertraline 50 MG tablet Commonly known as: ZOLOFT Take 50 mg by mouth daily.   Spiriva Respimat 2.5 MCG/ACT Aers Generic drug: Tiotropium Bromide Monohydrate Inhale 2 puffs into the lungs daily.   tamsulosin 0.4 MG Caps capsule Commonly known as: FLOMAX Take 0.4 mg by mouth daily.   traZODone  100 MG tablet Commonly known as: DESYREL Take 100 mg by mouth at bedtime.   Walker Wheels Misc USE AS DIRECTED        Allergies  Allergen Reactions   Bactrim [Sulfamethoxazole-Trimethoprim] Other (See Comments)    ABD CRAMPS AND DIARRHEA   Oxycodone-Acetaminophen Other (See Comments)    Other reaction(s): Swelling   Remicade [Infliximab] Other (See Comments)    COULDN'T BREATHE   Codeine     REACTION: unknown reaction   Ibuprofen     REACTION: unknown reaction   Penicillins     REACTION: unknown reaction   Tramadol Hcl     REACTION: unknown reaction   Aspirin     REACTION: unknown reaction   Tramadol Nausea Only    Other reaction(s): Nausea   Vicodin [Hydrocodone-Acetaminophen] Nausea Only    Consultations: None  Procedures/Studies: DG Chest 1 View  Result  Date: 05/16/2021 CLINICAL DATA:  55 year old male with a history of fever EXAM: CHEST  1 VIEW COMPARISON:  06/19/2020 FINDINGS: Cardiomediastinal silhouette unchanged in size and contour. Unchanged right IJ port catheter. No pneumothorax or pleural effusion. No confluent airspace disease. Coarsened interstitial markings similar to the prior. Surgical changes of bilateral glenohumeral joints unchanged. IMPRESSION: Negative for acute cardiopulmonary disease Electronically Signed   By: Corrie Mckusick D.O.   On: 05/16/2021 14:59   CT Head Wo Contrast  Result Date: 05/16/2021 CLINICAL DATA:  Delirium.  Confusion. EXAM: CT HEAD WITHOUT CONTRAST TECHNIQUE: Contiguous axial images were obtained from the base of the skull through the vertex without intravenous contrast. COMPARISON:  Head CT 05/14/2008 FINDINGS: Brain: No intracranial hemorrhage, mass effect, or midline shift. No hydrocephalus. The basilar cisterns are patent. No evidence of territorial infarct or acute ischemia. No extra-axial or intracranial fluid collection. Vascular: No hyperdense vessel or unexpected calcification. Skull: No fracture or focal lesion. Sinuses/Orbits: No acute findings. Paranasal sinuses and mastoid air cells are clear. Unremarkable orbits. Other: None. IMPRESSION: Negative noncontrast head CT. Electronically Signed   By: Keith Rake M.D.   On: 05/16/2021 15:52   CT Abdomen Pelvis W Contrast  Result Date: 05/06/2021 CLINICAL DATA:  Left lower quadrant and suprapubic pain, pancytopenic EXAM: CT ABDOMEN AND PELVIS WITH CONTRAST TECHNIQUE: Multidetector CT imaging of the abdomen and pelvis was performed using the standard protocol following bolus administration of intravenous contrast. CONTRAST:  159m OMNIPAQUE IOHEXOL 300 MG/ML  SOLN COMPARISON:  CT abdomen and pelvis dated March 19, 2021 FINDINGS: Lower chest: No acute abnormality. Hepatobiliary: No focal liver abnormality is seen. No gallstones, gallbladder wall thickening, or  biliary dilatation. Pancreas: Unremarkable. No pancreatic ductal dilatation or surrounding inflammatory changes. Spleen: Normal in size without focal abnormality. Adrenals/Urinary Tract: Kidneys enhance symmetrically with no evidence of hydronephrosis. Bilateral adrenal glands are unremarkable. Bladder is unremarkable. Stomach/Bowel: Normal stomach. 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 Wall thickening of a small bowel loop located in the mid/left abdomen with associated engorgement of the Vasa recta and surrounding inflammatory change. No evidence of stricture or bowel obstruction. No intra-abdominal fluid collections. Vascular/Lymphatic: Aortic atherosclerosis. No enlarged abdominal or pelvic lymph nodes. Reproductive: Postsurgical changes of prior TURP. Other: No abdominal wall hernia or abnormality. No abdominopelvic ascites. Musculoskeletal: Prior right total hip arthroplasty. No aggressive appearing osseous lesions. IMPRESSION: Findings compatible with Crohn's enteritis of a small bowel loop located in the mid/left abdomen. No evidence of obstruction or intra-abdominal fluid collection. Electronically Signed   By: LYetta GlassmanMD   On: 05/06/2021 14:50  Subjective: Feels well, ready to go home. No fever, abd pain, dysuria. Clear-mided.  Discharge Exam: Vitals:   05/20/21 0604 05/20/21 0803  BP: 109/70   Pulse: 73   Resp: 20   Temp: 98.2 F (36.8 C)   SpO2: 97% 97%   General: Pt is alert, awake, not in acute distress Cardiovascular: RRR, S1/S2 +, no rubs, no gallops Respiratory: CTA bilaterally, no wheezing, no rhonchi Abdominal: Soft, NT, ND, bowel sounds +. Colostomy site without purulence. Extremities: No edema, no cyanosis  Labs: BNP (last 3 results) No results for input(s): BNP in the last 8760 hours. Basic Metabolic Panel: Recent Labs  Lab 05/16/21 1420 05/17/21 0421 05/18/21 0653  NA 128* 134* 140  K 3.9 3.8 3.9  CL 101 106 110  CO2 21* 21* 23   GLUCOSE 106* 92 84  BUN 15 14 13   CREATININE 0.92 0.68 0.61  CALCIUM 8.1* 7.7* 7.8*   Liver Function Tests: Recent Labs  Lab 05/16/21 1420 05/18/21 0653  AST 35 29  ALT 33 30  ALKPHOS 65 55  BILITOT 1.8* 1.0  PROT 5.9* 4.9*  ALBUMIN 2.9* 2.3*   No results for input(s): LIPASE, AMYLASE in the last 168 hours. No results for input(s): AMMONIA in the last 168 hours. CBC: Recent Labs  Lab 05/16/21 1420 05/17/21 0421 05/18/21 0653  WBC 5.2 5.2 4.3  NEUTROABS 4.0  --   --   HGB 9.0* 8.0* 8.1*  HCT 25.1* 23.0* 23.8*  MCV 118.4* 117.3* 119.6*  PLT 102* 126* 143*   Cardiac Enzymes: No results for input(s): CKTOTAL, CKMB, CKMBINDEX, TROPONINI in the last 168 hours. BNP: Invalid input(s): POCBNP CBG: Recent Labs  Lab 05/19/21 1613 05/19/21 2149 05/20/21 0737 05/20/21 0806 05/20/21 1120  GLUCAP 74 109* 66* 111* 95   D-Dimer No results for input(s): DDIMER in the last 72 hours. Hgb A1c No results for input(s): HGBA1C in the last 72 hours. Lipid Profile No results for input(s): CHOL, HDL, LDLCALC, TRIG, CHOLHDL, LDLDIRECT in the last 72 hours. Thyroid function studies No results for input(s): TSH, T4TOTAL, T3FREE, THYROIDAB in the last 72 hours.  Invalid input(s): FREET3 Anemia work up No results for input(s): VITAMINB12, FOLATE, FERRITIN, TIBC, IRON, RETICCTPCT in the last 72 hours. Urinalysis    Component Value Date/Time   COLORURINE YELLOW 05/16/2021 1411   APPEARANCEUR HAZY (A) 05/16/2021 1411   LABSPEC 1.016 05/16/2021 1411   PHURINE 6.0 05/16/2021 1411   GLUCOSEU NEGATIVE 05/16/2021 1411   HGBUR SMALL (A) 05/16/2021 1411   BILIRUBINUR NEGATIVE 05/16/2021 1411   KETONESUR NEGATIVE 05/16/2021 1411   PROTEINUR NEGATIVE 05/16/2021 1411   UROBILINOGEN 0.2 10/17/2014 1940   NITRITE POSITIVE (A) 05/16/2021 1411   LEUKOCYTESUR TRACE (A) 05/16/2021 1411    Microbiology Recent Results (from the past 240 hour(s))  Resp Panel by RT-PCR (Flu A&B, Covid)  Nasopharyngeal Swab     Status: None   Collection Time: 05/16/21  2:11 PM   Specimen: Nasopharyngeal Swab; Nasopharyngeal(NP) swabs in vial transport medium  Result Value Ref Range Status   SARS Coronavirus 2 by RT PCR NEGATIVE NEGATIVE Final    Comment: (NOTE) SARS-CoV-2 target nucleic acids are NOT DETECTED.  The SARS-CoV-2 RNA is generally detectable in upper respiratory specimens during the acute phase of infection. The lowest concentration of SARS-CoV-2 viral copies this assay can detect is 138 copies/mL. A negative result does not preclude SARS-Cov-2 infection and should not be used as the sole basis for treatment or other patient management decisions.  A negative result may occur with  improper specimen collection/handling, submission of specimen other than nasopharyngeal swab, presence of viral mutation(s) within the areas targeted by this assay, and inadequate number of viral copies(<138 copies/mL). A negative result must be combined with clinical observations, patient history, and epidemiological information. The expected result is Negative.  Fact Sheet for Patients:  EntrepreneurPulse.com.au  Fact Sheet for Healthcare Providers:  IncredibleEmployment.be  This test is no t yet approved or cleared by the [place] FDA and  has been authorized for detection and/or diagnosis of SARS-CoV-2 by FDA under an Emergency Use Authorization (EUA). This EUA will remain  in effect (meaning this test can be used) for the duration of the COVID-19 declaration under Section 564(b)(1) of the Act, 21 U.S.C.section 360bbb-3(b)(1), unless the authorization is terminated  or revoked sooner.       Influenza A by PCR NEGATIVE NEGATIVE Final   Influenza B by PCR NEGATIVE NEGATIVE Final    Comment: (NOTE) The Xpert Xpress SARS-CoV-2/FLU/RSV plus assay is intended as an aid in the diagnosis of influenza from Nasopharyngeal swab specimens and should not be  used as a sole basis for treatment. Nasal washings and aspirates are unacceptable for Xpert Xpress SARS-CoV-2/FLU/RSV testing.  Fact Sheet for Patients: EntrepreneurPulse.com.au  Fact Sheet for Healthcare Providers: IncredibleEmployment.be  This test is not yet approved or cleared by the [place] FDA and has been authorized for detection and/or diagnosis of SARS-CoV-2 by FDA under an Emergency Use Authorization (EUA). This EUA will remain in effect (meaning this test can be used) for the duration of the COVID-19 declaration under Section 564(b)(1) of the Act, 21 U.S.C. section 360bbb-3(b)(1), unless the authorization is terminated or revoked.  Performed at [facility], [address], [place], [place] [address]   Culture, blood (routine x 2)     Status: None (Preliminary result)   Collection Time: 05/16/21  2:21 PM   Specimen: BLOOD RIGHT ARM  Result Value Ref Range Status   Specimen Description BLOOD RIGHT ARM  Final   Special Requests   Final    BOTTLES DRAWN AEROBIC AND ANAEROBIC Blood Culture adequate volume   Culture   Final    NO GROWTH 4 DAYS Performed at [facility], [address] [place], [place] [address]    Report Status PENDING  Incomplete  Urine Culture     Status: Abnormal   Collection Time: 05/16/21  4:10 PM   Specimen: Urine, Clean Catch  Result Value Ref Range Status   Specimen Description   Final    URINE, CLEAN CATCH Performed at [facility], [address], [place], [place] [address]    Special Requests   Final    NONE Performed at [facility], [address] [place], [place] [address]    Culture >=100,000 COLONIES/mL PSEUDOMONAS AERUGINOSA (A)  Final   Report Status 05/20/2021 FINAL  Final   Organism ID, Bacteria PSEUDOMONAS AERUGINOSA (A)  Final      Susceptibility   Pseudomonas aeruginosa - MIC*    CEFTAZIDIME 4 SENSITIVE Sensitive     CIPROFLOXACIN <=0.25 SENSITIVE Sensitive     GENTAMICIN <=1  SENSITIVE Sensitive     IMIPENEM 8 INTERMEDIATE Intermediate     PIP/TAZO 8 SENSITIVE Sensitive     CEFEPIME 2 SENSITIVE Sensitive     * >=100,000 COLONIES/mL PSEUDOMONAS AERUGINOSA  Culture, blood (routine x 2)     Status: None (Preliminary result)   Collection Time: 05/16/21 10:29 PM   Specimen: BLOOD LEFT ARM  Result Value Ref Range Status   Specimen Description BLOOD LEFT ARM  Final   Special Requests   Final    BOTTLES DRAWN AEROBIC AND ANAEROBIC Blood Culture adequate volume   Culture   Final    NO GROWTH 4 DAYS Performed at [facility], [address] [place], [place] [address]    Report Status PENDING  Incomplete    Time coordinating discharge: Approximately 40 minutes  Patrecia Pour, MD  [facility] 05/20/2021, 11:34 AM

## 2021-05-20 NOTE — Clinical Social Work Note (Signed)
CSW updated by RN that Ms. Grsves with [employer] was requesting documentation that pt was not able to appear in court this morning due to hospital admission. Court letter sent via email to [email or web address]. TOC signing off.

## 2021-05-21 LAB — CULTURE, BLOOD (ROUTINE X 2)
Culture: NO GROWTH
Culture: NO GROWTH
Special Requests: ADEQUATE
Special Requests: ADEQUATE

## 2021-05-22 ENCOUNTER — Telehealth: Payer: Self-pay | Admitting: Internal Medicine

## 2021-05-22 ENCOUNTER — Other Ambulatory Visit: Payer: Self-pay | Admitting: Gastroenterology

## 2021-05-22 DIAGNOSIS — R197 Diarrhea, unspecified: Secondary | ICD-10-CM

## 2021-05-22 DIAGNOSIS — K50919 Crohn's disease, unspecified, with unspecified complications: Secondary | ICD-10-CM

## 2021-05-22 NOTE — Telephone Encounter (Signed)
Spoke with patient.  Reports diarrhea since last Saturday.  Stools are watery and emptying bag multiple times a day.  Denies fever, chills, significant abdominal pain, cold or flulike symptoms, nausea, vomiting.  Recently hospitalized and has been on rounds of antibiotics due to Pseudomonas UTI.  He was discharged on 8/17.  He had diarrhea during hospitalization, but this was not addressed.  Previously seen in the emergency room on 8/3 and had CT which did show some active evidence of Crohn's.  Notably, he was taken off of methotrexate and mercaptopurine due to pancytopenia.  He was to start prednisone 30 mg daily and continue this until he saw [place] in the office, but states he last took prednisone on 8/13.  I suspect this is likely a Crohn's flare.  However, with hospitalization and antibiotics, cannot rule out infectious diarrhea.  Prefer to rule this out before resuming prednisone. I will place orders for C diff and GI pathogen panel as well as CRP and ESR.  I have advised patient to have stool studies done with further recommendations to follow.  Recommended ER evaluation if any worsening symptoms, inability to stay hydrated.

## 2021-05-22 NOTE — Telephone Encounter (Signed)
Pt called stating that he has had diarrhea since last Sat. Was d/c'd from [facility] on Wed 05/20/21. Pt is having diarrhea and abd pain. No nausea, vomiting, fever, or rectal bleeding. Pt states that he has tried pepto bismol and it is not working. Currently on cipro for UTI diagnosed at [facility].

## 2021-05-25 NOTE — Telephone Encounter (Signed)
noted 

## 2021-05-26 ENCOUNTER — Other Ambulatory Visit: Payer: Self-pay

## 2021-05-27 NOTE — Telephone Encounter (Signed)
No need to do stool studies if diarrhea resolved.

## 2021-05-27 NOTE — Telephone Encounter (Signed)
Pt called in and said that he went to Lab to get stool study collection kit.  He said that the Lab Tech told him that he needed to turn specimen in by tomorrow.  He said he is no longer with watery stools. He said that he only has diarrhea when drinking a lot of liquids.

## 2021-05-27 NOTE — Telephone Encounter (Signed)
Spoke to pt and informed him that no need to do stool studies since diarrhea resolved.  Pt voiced understanding.

## 2021-05-28 LAB — C-REACTIVE PROTEIN: CRP: 15.8 mg/L — ABNORMAL HIGH (ref <8.0)

## 2021-05-28 LAB — SEDIMENTATION RATE: Sed Rate: 14 mm/h (ref 0–20)

## 2021-05-29 ENCOUNTER — Other Ambulatory Visit: Payer: Self-pay | Admitting: Gastroenterology

## 2021-05-29 DIAGNOSIS — K50919 Crohn's disease, unspecified, with unspecified complications: Secondary | ICD-10-CM

## 2021-05-29 MED ORDER — PREDNISONE 10 MG PO TABS: 30.0000 mg | ORAL_TABLET | Freq: Every day | ORAL | 0 refills | Status: DC

## 2021-06-04 ENCOUNTER — Other Ambulatory Visit: Payer: Self-pay

## 2021-06-04 ENCOUNTER — Telehealth (HOSPITAL_COMMUNITY): Payer: Self-pay | Admitting: Dietician

## 2021-06-04 ENCOUNTER — Encounter: Payer: Self-pay | Admitting: Cardiology

## 2021-06-04 ENCOUNTER — Other Ambulatory Visit: Payer: Self-pay | Admitting: Gastroenterology

## 2021-06-04 ENCOUNTER — Other Ambulatory Visit: Payer: Self-pay | Admitting: Nurse Practitioner

## 2021-06-04 ENCOUNTER — Ambulatory Visit (INDEPENDENT_AMBULATORY_CARE_PROVIDER_SITE_OTHER): Payer: Medicaid Other | Admitting: Cardiology

## 2021-06-04 ENCOUNTER — Encounter (HOSPITAL_COMMUNITY): Payer: Medicaid Other | Admitting: Dietician

## 2021-06-04 VITALS — BP 136/78 | HR 84 | Ht 66.0 in | Wt 200.6 lb

## 2021-06-04 DIAGNOSIS — R109 Unspecified abdominal pain: Secondary | ICD-10-CM

## 2021-06-04 DIAGNOSIS — I471 Supraventricular tachycardia, unspecified: Secondary | ICD-10-CM

## 2021-06-04 MED ORDER — METOPROLOL SUCCINATE ER 50 MG PO TB24: 50.0000 mg | ORAL_TABLET | Freq: Every day | ORAL | 3 refills | Status: DC

## 2021-06-04 NOTE — Progress Notes (Signed)
Electrophysiology Office Note   Date:  06/04/2021   ID:  [name], Lane 05-27-1966, MRN [id number]  PCP:  [name], MD  Cardiologist:  Margaretann Loveless Primary Electrophysiologist:  Briseyda Fehr Meredith Leeds, MD    Chief Complaint: SVT   History of Present Illness: [name] is a 55 y.o. male who is being seen today for the evaluation of SVT, pauses at the request of [name], MD. Presenting today for electrophysiology evaluation.  He has a history significant for Crohn's disease with total colectomy and ileostomy in 2009, lower extremity edema/DVT PE, hyperlipidemia, legal blindness, and schizophrenia.  He presents with shortness of breath and palpitations.  He wore a cardiac monitor that showed episodes of SVT.  He was put on Toprol-XL.  His SVT episodes lasted up to 1 minute and 30 seconds.  During his pause episode of 3.7 seconds, he was asleep at the time.  Today, denies symptoms of palpitations, chest pain, shortness of breath, orthopnea, PND, lower extremity edema, claudication, dizziness, presyncope, syncope, bleeding, or neurologic sequela. The patient is tolerating medications without difficulties.  Unfortunately he was in the hospital in the middle of August with sepsis from Pseudomonas UTI.  Since that time he has done well.  He is continued to have episodes of SVT.  Episodes occur a few times a week.  Episodes are usually short-lived, but per the patient, can last longer than a few minutes at a time.   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[place])    right, s/p replacement, due to prednisone   Bipolar disorder ([place])    Colostomy in place [facility])    Crohn's colitis ([place])    s/p total colectomy with ileostomy in 2009   Crohn's disease of small intestine [facility]) Sept 2012   ileoscopy: multiple ulcers likely secondary to [place], HX OF 02/11/2009   Qualifier: Diagnosis of  By:  Zeb Comfort     Depression    DVT (deep venous thrombosis) ([place]) 2009   right upper extremity due to PICC   Enterococcus UTI 2009   GERD (gastroesophageal reflux disease)    Hernia    Hyperlipidemia    Insomnia    Migraine headache    Nystagmus    PULMONARY EMBOLISM, HX OF 02/11/2009   Qualifier: Diagnosis of  By: Zeb Comfort     S/P endoscopy Sept 2012   mild gastritis, small hiatal hernia, no ulcers   Schizophrenia ([place])    Schizophrenia, acute ([place])    Past Surgical History:  Procedure Laterality Date   APPENDECTOMY     ESOPHAGOGASTRODUODENOSCOPY  07/2010   gastritis,  bx neg for H.Pylori   ESOPHAGOGASTRODUODENOSCOPY N/A 01/08/2014   SLF:NO SOURCE FOR ODYNOPHAGIA IDENTIFIED/Multiple small ulcers in the gastric antrum   EYE SURGERY     ILEOSCOPY  06/29/2011   SLF: ulcers,multiple/small HH/mild gastritis   KIDNEY SURGERY     LAPAROTOMY N/A 06/05/2013   Procedure: EXPLORATORY LAPAROTOMY;  Surgeon: Donato Heinz, MD;  Location: [facility];  Service: General;  Laterality: N/A;   LYSIS OF ADHESION N/A 06/05/2013   Procedure: LYSIS OF ADHESIONS;  Surgeon: Donato Heinz, MD;  Location: [facility];  Service: General;  Laterality: N/A;   small bowel capsule study  08/2010   few tiny nonbleeding erosions/ulcers ?secondary to relafen or Crohn's. Entire small bowel not seen.    TOTAL COLECTOMY  2009   with ileostomy at [facility] for refractory disease    TOTAL HIP ARTHROPLASTY     right, due to avascular necrosis from chronic prednisone   TOTAL SHOULDER REPLACEMENT     bilateral   TRANSURETHRAL RESECTION OF PROSTATE N/A 12/03/2019   Procedure: TRANSURETHRAL RESECTION OF THE PROSTATE (TURP);  Surgeon: Cleon Gustin, MD;  Location: [facility];  Service: Urology;  Laterality: N/A;     Current Outpatient Medications  Medication Sig Dispense Refill   ACCU-CHEK AVIVA PLUS test strip SMARTSIG:Via Meter     albuterol (PROVENTIL) (2.5 MG/3ML) 0.083% nebulizer solution Take 2.5 mg by  nebulization every 6 (six) hours as needed for wheezing or shortness of breath.     albuterol (VENTOLIN HFA) 108 (90 Base) MCG/ACT inhaler Inhale 2 puffs into the lungs every 6 (six) hours as needed for wheezing or shortness of breath.     atorvastatin (LIPITOR) 80 MG tablet Take 80 mg by mouth daily.     azelastine (ASTELIN) 0.1 % nasal spray Place 2 sprays into both nostrils 2 (two) times daily.     baclofen (LIORESAL) 10 MG tablet Take 10 mg by mouth daily.     benztropine (COGENTIN) 0.5 MG tablet Take 0.5 mg by mouth daily.      CALCIUM 600/VITAMIN D3 600-800 MG-UNIT TABS TAKE (1) TABLET BY MOUTH THREE TIMES DAILY AFTER MEALS. 84 tablet 5   Cyanocobalamin (B-12) 1000 MCG CAPS Take 1 capsule by mouth daily.     dicyclomine (BENTYL) 10 MG capsule Take 1 capsule (10 mg total) by mouth as needed prior to meals and at bedtime for abdominal cramping. Hold in the setting of constipation. 90 capsule 1   divalproex (DEPAKOTE) 250 MG DR tablet Take by mouth at bedtime.     divalproex (DEPAKOTE) 500 MG 24 hr tablet Take 1,000 mg by mouth every evening.     docusate sodium (COLACE) 100 MG capsule Take 1 capsule (100 mg total) by mouth 2 (two) times daily. 10 capsule 0   ELIQUIS 5 MG TABS tablet Take 5 mg by mouth 2 (two) times daily.     FLOVENT HFA 110 MCG/ACT inhaler Inhale 2 puffs into the lungs 2 (two) times daily.     furosemide (LASIX) 40 MG tablet Take 40 mg by mouth daily.     gabapentin (NEURONTIN) 300 MG capsule Take 900 mg by mouth 3 (three) times daily.     meloxicam (MOBIC) 7.5 MG tablet Take 7.5 mg by mouth daily.     metFORMIN (GLUCOPHAGE-XR) 500 MG 24 hr tablet Take 1,000 mg by mouth 2 (two) times daily.     metoprolol succinate (TOPROL-XL) 50 MG 24 hr tablet Take 1 tablet (50 mg total) by mouth daily. Take with or immediately following a meal. 90 tablet 3   Misc. Devices (WALKER WHEELS) MISC USE AS DIRECTED 1 each 0   mometasone-formoterol (DULERA) 200-5 MCG/ACT AERO Inhale 2 puffs into  the lungs in the morning and at bedtime. 13 g 5   nitroGLYCERIN (NITROSTAT) 0.4 MG SL tablet Place 1 tablet (0.4 mg total) under the tongue every 5 (five) minutes as needed for chest pain. 25 tablet 3   Ostomy Supplies (ACTIVE LIFE 1-PC DRAIN [id number]) Pouch MISC      pantoprazole (PROTONIX) 40 MG tablet TAKE (1) TABLET BY MOUTH TWICE A DAY BEFORE MEALS. (BREAKFAST AND SUPPER) (Patient taking differently: Take 40 mg by mouth 2 (two) times daily before a meal.) 56 tablet 11   potassium  chloride SA (K-DUR,KLOR-CON) 20 MEQ tablet Take 20 mEq by mouth daily.      predniSONE (DELTASONE) 10 MG tablet Take 3 tablets (30 mg total) by mouth daily. 60 tablet 0   risperiDONE (RISPERDAL) 1 MG tablet Take 1 tablet by mouth at bedtime.     risperiDONE (RISPERDAL) 3 MG tablet Take 3 mg by mouth 2 (two) times daily.     sertraline (ZOLOFT) 50 MG tablet Take 50 mg by mouth daily.     tamsulosin (FLOMAX) 0.4 MG CAPS capsule Take 0.4 mg by mouth daily.     Tiotropium Bromide Monohydrate (SPIRIVA RESPIMAT) 2.5 MCG/ACT AERS Inhale 2 puffs into the lungs daily. 4 g 5   traZODone (DESYREL) 100 MG tablet Take 100 mg by mouth at bedtime.     ciprofloxacin (CIPRO) 500 MG tablet Take 1 tablet (500 mg total) by mouth 2 (two) times daily. (Patient not taking: Reported on 06/04/2021) 9 tablet 0   No current facility-administered medications for this visit.    Allergies:   Bactrim [sulfamethoxazole-trimethoprim], Oxycodone-acetaminophen, Remicade [infliximab], Codeine, Ibuprofen, Penicillins, Tramadol hcl, Aspirin, Tramadol, and Vicodin [hydrocodone-acetaminophen]   Social History:  The patient  reports that he has been smoking cigarettes. He has a 148.00 pack-year smoking history. He has never used smokeless tobacco. He reports that he does not drink alcohol and does not use drugs.   Family History:  The patient's family history includes COPD in his maternal grandmother; Diabetes in his father and mother; Heart disease in his  father and mother.   ROS:  Please see the history of present illness.   Otherwise, review of systems is positive for none.   All other systems are reviewed and negative.   PHYSICAL EXAM: VS:  BP 136/78   Pulse 84   Ht 5' 6"  (1.676 m)   Wt 200 lb 9.6 oz (91 kg)   SpO2 98%   BMI 32.38 kg/m  , BMI Body mass index is 32.38 kg/m. GEN: Well nourished, well developed, in no acute distress  HEENT: normal  Neck: no JVD, carotid bruits, or masses Cardiac: RRR; no murmurs, rubs, or gallops,no edema  Respiratory:  clear to auscultation bilaterally, normal work of breathing GI: soft, nontender, nondistended, + BS MS: no deformity or atrophy  Skin: warm and dry Neuro:  Strength and sensation are intact Psych: euthymic mood, full affect  EKG:  EKG is not ordered today. Personal review of the ekg ordered 05/18/21 shows sinus rhythm, rate 95  Recent Labs: 05/18/2021: ALT 30; BUN 13; Creatinine, Ser 0.61; Hemoglobin 8.1; Platelets 143; Potassium 3.9; Sodium 140    Lipid Panel  No results found for: CHOL, TRIG, HDL, CHOLHDL, VLDL, LDLCALC, LDLDIRECT   Wt Readings from Last 3 Encounters:  06/04/21 200 lb 9.6 oz (91 kg)  05/16/21 205 lb 4 oz (93.1 kg)  05/11/21 205 lb 3.2 oz (93.1 kg)      Other studies Reviewed: Additional studies/ records that were reviewed today include: TTE 11/13/20  Review of the above records today demonstrates:   1. Left ventricular ejection fraction, by estimation, is 55 to 60%. The  left ventricle has normal function. Left ventricular endocardial border  not optimally defined to evaluate regional wall motion. Left ventricular  diastolic parameters were normal.   2. Left atrial size was mildly dilated.   3. The mitral valve is normal in structure. Trivial mitral valve  regurgitation.   4. The aortic valve is tricuspid. Mild aortic valve sclerosis is present,  with  no evidence of aortic valve stenosis.   5. Tricuspid regurgitation signal is inadequate for  assessing PA  pressure.   Cardiac monitor 12/27/2020 personally reviewed Minimum HR (bpm): 41  Maximum HR (bpm): 176 Supraventricular Ectopy: rare <1% SVT: 9 runs, 19 beats at rate of 141 and 1 min 32 sec at rate of 119 bpm. Ventricular Ectopy: rare <1% NSVT: none Ventricular Tachycardia: none Pauses: one pause lasting 3.7 seconds at 5:25am on 11/06/20 AV block: none Atrial fibrillation: none IMPRESSION: One >3 second pause and SVT noted on monitor, no symptoms recorded.  ASSESSMENT AND PLAN:  1.  SVT: Patient has a narrow complex tachycardia.  His runs have been short, though he does complain of hours long symptoms.  He is currently on Toprol-XL.  He is continue to have symptoms of SVT.  These occur almost on a daily basis.  Due to his comorbid conditions, would like to avoid invasive procedures.  We [name] increase Toprol-XL to 50 mg.  Current medicines are reviewed at length with the patient today.   The patient does not have concerns regarding his medicines.  The following changes were made today: Increase Toprol-XL  Labs/ tests ordered today include:  No orders of the defined types were placed in this encounter.    Disposition:   FU with [name] 6 months  Signed, Uel Davidow Meredith Leeds, MD  06/04/2021 3:54 PM     [place] [address] [place] [address] [phone] (office) [phone] (fax)

## 2021-06-04 NOTE — Patient Instructions (Signed)
Medication Instructions:  Your physician has recommended you make the following change in your medication:   INCREASE Metoprolol succinate (Toprol XL) to 50 mg once daily  *If you need a refill on your cardiac medications before your next appointment, please call your pharmacy*   Lab Work: None Ordered If you have labs (blood work) drawn today and your tests are completely normal, you will receive your results only by: [place] (if you have MyChart) OR A paper copy in the mail If you have any lab test that is abnormal or we need to change your treatment, we will call you to review the results.   Testing/Procedures: None Ordered   Follow-Up: At [facility], you and your health needs are our priority.  As part of our continuing mission to provide you with exceptional heart care, we have created designated Provider Care Teams.  These Care Teams include your primary Cardiologist (physician) and Advanced Practice Providers (APPs -  Physician Assistants and Nurse Practitioners) who all work together to provide you with the care you need, when you need it.  We recommend signing up for the patient portal called "MyChart".  Sign up information is provided on this After Visit Summary.  MyChart is used to connect with patients for Virtual Visits (Telemedicine).  Patients are able to view lab/test results, encounter notes, upcoming appointments, etc.  Non-urgent messages can be sent to your provider as well.   To learn more about what you can do with MyChart, go to [email or web address].    Your next appointment:   6 month(s)  The format for your next appointment:   In Person  Provider:   You may see Allegra Lai, MD or one of the following Advanced Practice Providers on your designated Care Team:   Tommye Standard, [place] "[facility]" [place], [place]

## 2021-06-04 NOTE — Telephone Encounter (Signed)
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  Patient reports his appetite comes and goes, says sometimes he is forcing himself to eat. Patient reports yesterday he was not hungry, recalls a bologna sandwich and 1 pepsi. The previous day he had a few pieces of fried sausage and 2-3 eggs for breakfast and double cheeseburger for dinner. Patient reports his PCP wants him to be on a "diabetic diet" and trying to eat fresh fruits and vegetables, says this is too expensive.   Medications: Prednisone, Bentyl  Labs: 8/14 - A1c 4.8  Anthropometrics: Last weight 205 lb 4 oz on 8/13 stable  8/8 - 205 lb 3.2 oz 7/20 - 196 lb  6/14 - 202 lb 3.2   NUTRITION DIAGNOSIS: Inadequate oral intake ongoing   INTERVENTION:  Reviewed strategies for poor appetite - pt has handout Encouraged eating smaller meals and snacks more frequently with adequate calories and protein - will mail handout with snack ideas Discussed purchasing frozen vegetables without seasoning/sauce or canned vegetables that are drained and rinsed vs fresh Encouraged drinking nutrition supplement with decreased intake - will mail coupons Patient has contact information    MONITORING, EVALUATION, GOAL: weight trends, intake   NEXT VISIT: via telephone ~6 weeks

## 2021-06-09 ENCOUNTER — Ambulatory Visit (HOSPITAL_COMMUNITY): Payer: Medicaid Other | Admitting: Physician Assistant

## 2021-06-09 ENCOUNTER — Other Ambulatory Visit (HOSPITAL_COMMUNITY): Payer: Medicaid Other

## 2021-06-10 ENCOUNTER — Ambulatory Visit (INDEPENDENT_AMBULATORY_CARE_PROVIDER_SITE_OTHER): Payer: Medicaid Other | Admitting: Internal Medicine

## 2021-06-10 ENCOUNTER — Other Ambulatory Visit: Payer: Self-pay

## 2021-06-10 ENCOUNTER — Encounter: Payer: Self-pay | Admitting: Internal Medicine

## 2021-06-10 VITALS — BP 97/59 | HR 72 | Temp 97.0°F | Ht 66.0 in | Wt 198.0 lb

## 2021-06-10 DIAGNOSIS — R197 Diarrhea, unspecified: Secondary | ICD-10-CM | POA: Diagnosis not present

## 2021-06-10 DIAGNOSIS — K50919 Crohn's disease, unspecified, with unspecified complications: Secondary | ICD-10-CM | POA: Diagnosis not present

## 2021-06-10 NOTE — Patient Instructions (Signed)
I think at this point, we need to refer you to the [facility] inflammatory bowel disease specialty clinic.  I think you need a full multidisciplinary team to advise on your case as it is quite complicated given your rheumatoid arthritis, Crohn's disease, surgical history, as well as issues with your blood counts.  I would be happy to help out in any way I can from a local standpoint if needed.  It was great meeting you today.  Dr. Abbey Chatters  At [facility] we value your feedback. You may receive a survey about your visit today. Please share your experience as we strive to create trusting relationships with our patients to provide genuine, compassionate, quality care.  We appreciate your understanding and patience as we review any laboratory studies, imaging, and other diagnostic tests that are ordered as we care for you. Our office policy is 5 business days for review of these results, and any emergent or urgent results are addressed in a timely manner for your best interest. If you do not hear from our office in 1 week, please contact us.   We also encourage the use of MyChart, which contains your medical information for your review as well. If you are not enrolled in this feature, an access code is on this after visit summary for your convenience. Thank you for allowing [place] to be involved in your care.  It was great to see you today!  I hope you have a great rest of your summer!!    Elon Alas. Abbey Chatters, D.O. Gastroenterology and Hepatology [facility]

## 2021-06-17 ENCOUNTER — Ambulatory Visit (HOSPITAL_COMMUNITY): Payer: Medicaid Other | Admitting: Hematology

## 2021-06-17 ENCOUNTER — Other Ambulatory Visit (HOSPITAL_COMMUNITY): Payer: Medicaid Other

## 2021-06-18 NOTE — Progress Notes (Signed)
Referring Provider: [name], MD Primary Care Physician:  [name], MD Primary GI:  Dr. Abbey Chatters  Chief Complaint  Patient presents with   Crohn's Disease   Abdominal Pain    C/o pain all over. Went to ED last month x 2    HPI:   [name] is a 55 y.o. male who presents to clinic today for follow-up visit.  He has a history of Crohn's colitis diagnosed many years ago status post total colectomy with ileostomy in 2009.  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  Also has history of rheumatoid arthritis.  He was switched to Humira and ended up with pseudomonal UTI infection requiring hospitalization.  This was then discontinued.  Today he states that he feels as though his disease is not well controlled.  Notes loose stool out of his stoma as well as occasional blood.  Also with significant abdominal pain.  CT abdomen pelvis with contrast on 05/06/2021 showed findings consistent with Crohn's enteritis.  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, s/p replacement, due to prednisone   Bipolar disorder ([place])    Colostomy in place [facility])    Crohn's colitis ([place])    s/p total colectomy with ileostomy in 2009   Crohn's disease of small intestine [facility]) Sept 2012   ileoscopy: multiple ulcers likely secondary to [place], HX OF 02/11/2009   Qualifier: Diagnosis of  By: Zeb Comfort     Depression    DVT (deep venous thrombosis) ([place]) 2009   right upper extremity due to PICC   Enterococcus UTI 2009   GERD (gastroesophageal reflux disease)    Hernia    Hyperlipidemia    Insomnia    Migraine headache    Nystagmus    PULMONARY EMBOLISM, HX OF 02/11/2009   Qualifier: Diagnosis of  By: Zeb Comfort     S/P endoscopy Sept 2012   mild gastritis, small hiatal hernia, no ulcers   Schizophrenia ([place])    Schizophrenia,  acute ([place])     Past Surgical History:  Procedure Laterality Date   APPENDECTOMY     ESOPHAGOGASTRODUODENOSCOPY  07/2010   gastritis,  bx neg for H.Pylori   ESOPHAGOGASTRODUODENOSCOPY N/A 01/08/2014   SLF:NO SOURCE FOR ODYNOPHAGIA IDENTIFIED/Multiple small ulcers in the gastric antrum   EYE SURGERY     ILEOSCOPY  06/29/2011   SLF: ulcers,multiple/small HH/mild gastritis   KIDNEY SURGERY     LAPAROTOMY N/A 06/05/2013   Procedure: EXPLORATORY LAPAROTOMY;  Surgeon: Donato Heinz, MD;  Location: [facility];  Service: General;  Laterality: N/A;   LYSIS OF ADHESION N/A 06/05/2013   Procedure: LYSIS OF ADHESIONS;  Surgeon: Donato Heinz, MD;  Location: [facility];  Service: General;  Laterality: N/A;   small bowel capsule study  08/2010   few tiny nonbleeding erosions/ulcers ?secondary to relafen or Crohn's. Entire small bowel not seen.    TOTAL COLECTOMY  2009   with ileostomy at [facility] for refractory disease    TOTAL HIP ARTHROPLASTY     right, due to avascular necrosis from chronic prednisone   TOTAL SHOULDER REPLACEMENT     bilateral   TRANSURETHRAL RESECTION OF PROSTATE N/A 12/03/2019   Procedure: TRANSURETHRAL RESECTION OF THE PROSTATE (TURP);  Surgeon: Cleon Gustin, MD;  Location: [facility];  Service: Urology;  Laterality:  N/A;    Current Outpatient Medications  Medication Sig Dispense Refill   ACCU-CHEK AVIVA PLUS test strip SMARTSIG:Via Meter     albuterol (PROVENTIL) (2.5 MG/3ML) 0.083% nebulizer solution Take 2.5 mg by nebulization every 6 (six) hours as needed for wheezing or shortness of breath.     albuterol (VENTOLIN HFA) 108 (90 Base) MCG/ACT inhaler Inhale 2 puffs into the lungs every 6 (six) hours as needed for wheezing or shortness of breath.     atorvastatin (LIPITOR) 80 MG tablet Take 80 mg by mouth daily.     azelastine (ASTELIN) 0.1 % nasal spray Place 2 sprays into both nostrils 2 (two) times daily.     baclofen (LIORESAL) 10 MG tablet Take 10 mg by mouth daily.      benztropine (COGENTIN) 0.5 MG tablet Take 0.5 mg by mouth daily.      CALCIUM 600/VITAMIN D3 600-800 MG-UNIT TABS TAKE (1) TABLET BY MOUTH THREE TIMES DAILY AFTER MEALS. 84 tablet 11   Cyanocobalamin (B-12) 1000 MCG CAPS Take 1 capsule by mouth daily.     dicyclomine (BENTYL) 10 MG capsule TAKE 1 CAPSULE BY MOUTH BEFORE MEALS AND AT BEDTIME AS NEEDED FOR ABDOMINAL CRAMPING. *HOLD FOR CONSTIPATION* 90 capsule 11   divalproex (DEPAKOTE) 250 MG DR tablet Take by mouth at bedtime.     divalproex (DEPAKOTE) 500 MG 24 hr tablet Take 1,000 mg by mouth every evening.     docusate sodium (COLACE) 100 MG capsule Take 1 capsule (100 mg total) by mouth 2 (two) times daily. 10 capsule 0   ELIQUIS 5 MG TABS tablet Take 5 mg by mouth 2 (two) times daily.     FLOVENT HFA 110 MCG/ACT inhaler Inhale 2 puffs into the lungs 2 (two) times daily.     furosemide (LASIX) 40 MG tablet Take 40 mg by mouth daily.     gabapentin (NEURONTIN) 300 MG capsule Take 900 mg by mouth 3 (three) times daily.     meloxicam (MOBIC) 7.5 MG tablet Take 7.5 mg by mouth daily.     metFORMIN (GLUCOPHAGE-XR) 500 MG 24 hr tablet Take 1,000 mg by mouth 2 (two) times daily.     metoprolol succinate (TOPROL-XL) 50 MG 24 hr tablet Take 1 tablet (50 mg total) by mouth daily. Take with or immediately following a meal. 90 tablet 3   Misc. Devices (WALKER WHEELS) MISC USE AS DIRECTED 1 each 0   mometasone-formoterol (DULERA) 200-5 MCG/ACT AERO Inhale 2 puffs into the lungs in the morning and at bedtime. 13 g 5   nitroGLYCERIN (NITROSTAT) 0.4 MG SL tablet Place 1 tablet (0.4 mg total) under the tongue every 5 (five) minutes as needed for chest pain. 25 tablet 3   Ostomy Supplies (ACTIVE LIFE 1-PC DRAIN [id number]) Pouch MISC      pantoprazole (PROTONIX) 40 MG tablet TAKE (1) TABLET BY MOUTH TWICE A DAY BEFORE MEALS. (BREAKFAST AND SUPPER) (Patient taking differently: Take 40 mg by mouth 2 (two) times daily before a meal.) 56 tablet 11   potassium chloride  SA (K-DUR,KLOR-CON) 20 MEQ tablet Take 20 mEq by mouth daily.      risperiDONE (RISPERDAL) 1 MG tablet Take 1 tablet by mouth at bedtime.     risperiDONE (RISPERDAL) 3 MG tablet Take 3 mg by mouth 2 (two) times daily.     sertraline (ZOLOFT) 50 MG tablet Take 50 mg by mouth daily.     tamsulosin (FLOMAX) 0.4 MG CAPS capsule Take 0.4 mg by mouth daily.  Tiotropium Bromide Monohydrate (SPIRIVA RESPIMAT) 2.5 MCG/ACT AERS Inhale 2 puffs into the lungs daily. 4 g 5   traZODone (DESYREL) 100 MG tablet Take 100 mg by mouth at bedtime.     No current facility-administered medications for this visit.    Allergies as of 06/10/2021 - Review Complete 06/10/2021  Allergen Reaction Noted   Bactrim [sulfamethoxazole-trimethoprim] Other (See Comments) 11/07/2013   Oxycodone-acetaminophen Other (See Comments) 04/17/2012   Remicade [infliximab] Other (See Comments) 04/18/2013   Codeine     Ibuprofen     Penicillins     Tramadol hcl     Aspirin     Tramadol Nausea Only 11/04/2011   Vicodin [hydrocodone-acetaminophen] Nausea Only 05/17/2011    Family History  Problem Relation Age of Onset   Diabetes Mother    Heart disease Mother    Diabetes Father    Heart disease Father    COPD Maternal Grandmother    Colon cancer Neg Hx     Social History   Socioeconomic History   Marital status: Single    Spouse name: Not on file   Number of children: Not on file   Years of education: Not on file   Highest education level: Not on file  Occupational History   Not on file  Tobacco Use   Smoking status: Every Day    Packs/day: 4.00    Years: 37.00    Pack years: 148.00    Types: Cigarettes   Smokeless tobacco: Never   Tobacco comments:    down to 4ppd 11/14/20  Vaping Use   Vaping Use: Never used  Substance and Sexual Activity   Alcohol use: No   Drug use: No   Sexual activity: Never  Other Topics Concern   Not on file  Social History Narrative   Girlfriend of six years, Rectal Cancer on  Hospice (05/2011).   Social Determinants of Health   Financial Resource Strain: Not on file  Food Insecurity: Not on file  Transportation Needs: No Transportation Needs   Lack of Transportation (Medical): No   Lack of Transportation (Non-Medical): No  Physical Activity: Not on file  Stress: Not on file  Social Connections: Not on file    Subjective: Review of Systems  Constitutional:  Negative for chills and fever.  HENT:  Negative for congestion and hearing loss.   Eyes:  Negative for blurred vision and double vision.  Respiratory:  Negative for cough and shortness of breath.   Cardiovascular:  Negative for chest pain and palpitations.  Gastrointestinal:  Positive for abdominal pain and diarrhea. Negative for blood in stool, constipation, heartburn, melena and vomiting.  Genitourinary:  Negative for dysuria and urgency.  Musculoskeletal:  Negative for joint pain and myalgias.  Skin:  Negative for itching and rash.  Neurological:  Negative for dizziness and headaches.  Psychiatric/Behavioral:  Negative for depression. The patient is not nervous/anxious.     Objective: BP (!) 97/59   Pulse 72   Temp (!) 97 F (36.1 C)   Ht 5' 6"  (1.676 m)   Wt 198 lb (89.8 kg)   BMI 31.96 kg/m  Physical Exam Constitutional:      Appearance: Normal appearance.  HENT:     Head: Normocephalic and atraumatic.  Eyes:     Extraocular Movements: Extraocular movements intact.     Conjunctiva/sclera: Conjunctivae normal.  Cardiovascular:     Rate and Rhythm: Normal rate and regular rhythm.  Pulmonary:     Effort: Pulmonary effort is normal.  Breath sounds: Normal breath sounds.  Abdominal:     General: Bowel sounds are normal.     Palpations: Abdomen is soft.  Musculoskeletal:        General: Normal range of motion.     Cervical back: Normal range of motion and neck supple.  Skin:    General: Skin is warm.  Neurological:     General: No focal deficit present.     Mental Status: He  is alert and oriented to person, place, and time.  Psychiatric:        Mood and Affect: Mood normal.        Behavior: Behavior normal.     Assessment: *Crohn's disease-uncontrolled *History of subtotal colectomy with ileostomy *Pancytopenia  Plan: Patient's Crohn's disease previously well controlled on mercaptopurine and methotrexate but  this was recently discontinued due to leukopenia.  He was then placed on Humira but then ended up being admitted to the hospital for pseudomonal UTI so this was discontinued. Also with concomitant rheumatoid arthritis.  I think given the complicated nature of patient's disease as well as his associated rheumatoid disorder, he would be best served at an IBD clinic.  He states he would like to be referred to [facility] as that is where his rheumatologist is.  We will make this referral today.  I am certainly happy to help in any way I can from a local standpoint.  Otherwise follow-up as needed.     Disclaimer: This note was dictated with voice recognition software. Similar sounding words can inadvertently be transcribed and may not be corrected upon review.

## 2021-06-21 NOTE — Progress Notes (Deleted)
[place], [place] [address]   CLINIC:  Medical Oncology/Hematology  PCP:  [name], MD [address] [place] [address] [phone]   REASON FOR VISIT:  Follow-up for pancytopenia, recurrent DVTs  CURRENT THERAPY: Eliquis  INTERVAL HISTORY:  [name] 55 y.o. male returns for routine follow-up of pancytopenia and recurrent DVTs.  He was last seen by Tarri Abernethy PA-C on 05/11/2021.     At today's visit, he reports feeling ***.  No recent hospitalizations, surgeries, or changes in baseline health status.   *** He continues to take Eliquis and reports easy bruising. *** He denies any abnormal blood loss such as bright red blood per rectum or melena. *** Blood in ostomy bag??? *** He reports that his left lower extremity edema and pain has improved since his last visit. *** He denies any chest pain, dyspnea on exertion, or hemoptysis.  *** He continues to have fatigue and night sweats, but denies other B symptoms such as fever and chills. *** He reports that his appetite has improved and that he has gained some weight from his last visit.  At his last visit, his methotrexate and mercaptopurine were discontinued due to his pancytopenia.  He reports that he was started on Humira instead.  Unfortunately, he developed severe UTI while on Humira, so it was discontinued.  He has been referred to an IBD clinic for specialized management of his Crohn's disease.  He has ***% energy and ***% appetite. He endorses that he is maintaining a stable weight.    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[facility])    right, s/p replacement, due to prednisone   Bipolar disorder ([place])    Colostomy in place [facility])    Crohn's colitis ([place])    s/p total colectomy with ileostomy in 2009   Crohn's  disease of small intestine [facility]) Sept 2012   ileoscopy: multiple ulcers likely secondary to [place], HX OF 02/11/2009   Qualifier: Diagnosis of  By: Zeb Comfort     Depression    DVT (deep venous thrombosis) ([place]) 2009   right upper extremity due to PICC   Enterococcus UTI 2009   GERD (gastroesophageal reflux disease)    Hernia    Hyperlipidemia    Insomnia    Migraine headache    Nystagmus    PULMONARY EMBOLISM, HX OF 02/11/2009   Qualifier: Diagnosis of  By: Zeb Comfort     S/P endoscopy Sept 2012   mild gastritis, small hiatal hernia, no ulcers   Schizophrenia ([place])    Schizophrenia, acute ([place])    Past Surgical History:  Procedure Laterality Date   APPENDECTOMY     ESOPHAGOGASTRODUODENOSCOPY  07/2010   gastritis,  bx neg for H.Pylori   ESOPHAGOGASTRODUODENOSCOPY N/A 01/08/2014   SLF:NO SOURCE FOR ODYNOPHAGIA IDENTIFIED/Multiple small ulcers in the gastric antrum   EYE SURGERY     ILEOSCOPY  06/29/2011   SLF: ulcers,multiple/small HH/mild gastritis   KIDNEY SURGERY     LAPAROTOMY N/A 06/05/2013   Procedure: EXPLORATORY LAPAROTOMY;  Surgeon: Donato Heinz, MD;  Location: [facility];  Service: General;  Laterality: N/A;   LYSIS OF ADHESION N/A 06/05/2013   Procedure: LYSIS OF ADHESIONS;  Surgeon: Donato Heinz, MD;  Location:  [facility];  Service: General;  Laterality: N/A;   small bowel capsule study  08/2010   few tiny nonbleeding erosions/ulcers ?secondary to relafen or Crohn's. Entire small bowel not seen.    TOTAL COLECTOMY  2009   with ileostomy at [facility] for refractory disease    TOTAL HIP ARTHROPLASTY     right, due to avascular necrosis from chronic prednisone   TOTAL SHOULDER REPLACEMENT     bilateral   TRANSURETHRAL RESECTION OF PROSTATE N/A 12/03/2019   Procedure: TRANSURETHRAL RESECTION OF THE PROSTATE (TURP);  Surgeon: Cleon Gustin, MD;  Location: [facility];  Service: Urology;  Laterality: N/A;     SOCIAL HISTORY:  Social  History   Socioeconomic History   Marital status: Single    Spouse name: Not on file   Number of children: Not on file   Years of education: Not on file   Highest education level: Not on file  Occupational History   Not on file  Tobacco Use   Smoking status: Every Day    Packs/day: 4.00    Years: 37.00    Pack years: 148.00    Types: Cigarettes   Smokeless tobacco: Never   Tobacco comments:    down to 4ppd 11/14/20  Vaping Use   Vaping Use: Never used  Substance and Sexual Activity   Alcohol use: No   Drug use: No   Sexual activity: Never  Other Topics Concern   Not on file  Social History Narrative   Girlfriend of six years, Rectal Cancer on Hospice (05/2011).   Social Determinants of Health   Financial Resource Strain: Not on file  Food Insecurity: Not on file  Transportation Needs: No Transportation Needs   Lack of Transportation (Medical): No   Lack of Transportation (Non-Medical): No  Physical Activity: Not on file  Stress: Not on file  Social Connections: Not on file  Intimate Partner Violence: Not At Risk   Fear of Current or Ex-Partner: No   Emotionally Abused: No   Physically Abused: No   Sexually Abused: No    FAMILY HISTORY:  Family History  Problem Relation Age of Onset   Diabetes Mother    Heart disease Mother    Diabetes Father    Heart disease Father    COPD Maternal Grandmother    Colon cancer Neg Hx     CURRENT MEDICATIONS:  Outpatient Encounter Medications as of 06/22/2021  Medication Sig   ACCU-CHEK AVIVA PLUS test strip SMARTSIG:Via Meter   albuterol (PROVENTIL) (2.5 MG/3ML) 0.083% nebulizer solution Take 2.5 mg by nebulization every 6 (six) hours as needed for wheezing or shortness of breath.   albuterol (VENTOLIN HFA) 108 (90 Base) MCG/ACT inhaler Inhale 2 puffs into the lungs every 6 (six) hours as needed for wheezing or shortness of breath.   atorvastatin (LIPITOR) 80 MG tablet Take 80 mg by mouth daily.   azelastine (ASTELIN) 0.1  % nasal spray Place 2 sprays into both nostrils 2 (two) times daily.   baclofen (LIORESAL) 10 MG tablet Take 10 mg by mouth daily.   benztropine (COGENTIN) 0.5 MG tablet Take 0.5 mg by mouth daily.    CALCIUM 600/VITAMIN D3 600-800 MG-UNIT TABS TAKE (1) TABLET BY MOUTH THREE TIMES DAILY AFTER MEALS.   Cyanocobalamin (B-12) 1000 MCG CAPS Take 1 capsule by mouth daily.   dicyclomine (BENTYL) 10 MG capsule TAKE 1 CAPSULE BY MOUTH BEFORE MEALS AND AT BEDTIME AS NEEDED FOR ABDOMINAL CRAMPING. *HOLD FOR CONSTIPATION*   divalproex (DEPAKOTE)  250 MG DR tablet Take by mouth at bedtime.   divalproex (DEPAKOTE) 500 MG 24 hr tablet Take 1,000 mg by mouth every evening.   docusate sodium (COLACE) 100 MG capsule Take 1 capsule (100 mg total) by mouth 2 (two) times daily.   ELIQUIS 5 MG TABS tablet Take 5 mg by mouth 2 (two) times daily.   FLOVENT HFA 110 MCG/ACT inhaler Inhale 2 puffs into the lungs 2 (two) times daily.   furosemide (LASIX) 40 MG tablet Take 40 mg by mouth daily.   gabapentin (NEURONTIN) 300 MG capsule Take 900 mg by mouth 3 (three) times daily.   meloxicam (MOBIC) 7.5 MG tablet Take 7.5 mg by mouth daily.   metFORMIN (GLUCOPHAGE-XR) 500 MG 24 hr tablet Take 1,000 mg by mouth 2 (two) times daily.   metoprolol succinate (TOPROL-XL) 50 MG 24 hr tablet Take 1 tablet (50 mg total) by mouth daily. Take with or immediately following a meal.   Misc. Devices (WALKER WHEELS) MISC USE AS DIRECTED   mometasone-formoterol (DULERA) 200-5 MCG/ACT AERO Inhale 2 puffs into the lungs in the morning and at bedtime.   nitroGLYCERIN (NITROSTAT) 0.4 MG SL tablet Place 1 tablet (0.4 mg total) under the tongue every 5 (five) minutes as needed for chest pain.   Ostomy Supplies (ACTIVE LIFE 1-PC DRAIN [phone]) Pouch MISC    pantoprazole (PROTONIX) 40 MG tablet TAKE (1) TABLET BY MOUTH TWICE A DAY BEFORE MEALS. (BREAKFAST AND SUPPER) (Patient taking differently: Take 40 mg by mouth 2 (two) times daily before a meal.)    potassium chloride SA (K-DUR,KLOR-CON) 20 MEQ tablet Take 20 mEq by mouth daily.    risperiDONE (RISPERDAL) 1 MG tablet Take 1 tablet by mouth at bedtime.   risperiDONE (RISPERDAL) 3 MG tablet Take 3 mg by mouth 2 (two) times daily.   sertraline (ZOLOFT) 50 MG tablet Take 50 mg by mouth daily.   tamsulosin (FLOMAX) 0.4 MG CAPS capsule Take 0.4 mg by mouth daily.   Tiotropium Bromide Monohydrate (SPIRIVA RESPIMAT) 2.5 MCG/ACT AERS Inhale 2 puffs into the lungs daily.   traZODone (DESYREL) 100 MG tablet Take 100 mg by mouth at bedtime.   No facility-administered encounter medications on file as of 06/22/2021.    ALLERGIES:  Allergies  Allergen Reactions   Bactrim [Sulfamethoxazole-Trimethoprim] Other (See Comments)    ABD CRAMPS AND DIARRHEA   Oxycodone-Acetaminophen Other (See Comments)    Other reaction(s): Swelling   Remicade [Infliximab] Other (See Comments)    COULDN'T BREATHE   Codeine     REACTION: unknown reaction   Ibuprofen     REACTION: unknown reaction   Penicillins     REACTION: unknown reaction   Tramadol Hcl     REACTION: unknown reaction   Aspirin     REACTION: unknown reaction   Tramadol Nausea Only    Other reaction(s): Nausea   Vicodin [Hydrocodone-Acetaminophen] Nausea Only     PHYSICAL EXAM:  ECOG PERFORMANCE STATUS: {CHL ONC ECOG PS:[phone]}  There were no vitals filed for this visit. There were no vitals filed for this visit. Physical Exam   LABORATORY DATA:  I have reviewed the labs as listed.  CBC    Component Value Date/Time   WBC 4.3 05/18/2021 0653   RBC 1.99 (L) 05/18/2021 0653   HGB 8.1 (L) 05/18/2021 0653   HCT 23.8 (L) 05/18/2021 0653   HCT 42 05/24/2011 1417   PLT 143 (L) 05/18/2021 0653   MCV 119.6 (H) 05/18/2021 0653   MCH  40.7 (H) 05/18/2021 0653   MCHC 34.0 05/18/2021 0653   RDW 18.0 (H) 05/18/2021 0653   LYMPHSABS 0.4 (L) 05/16/2021 1420   MONOABS 0.8 05/16/2021 1420   EOSABS 0.0 05/16/2021 1420   BASOSABS 0.0  05/16/2021 1420   CMP Latest Ref Rng & Units 05/18/2021 05/17/2021 05/16/2021  Glucose 70 - 99 mg/dL 84 92 106(H)  BUN 6 - 20 mg/dL 13 14 15   Creatinine 0.61 - 1.24 mg/dL 0.61 0.68 0.92  Sodium 135 - 145 mmol/L 140 134(L) 128(L)  Potassium 3.5 - 5.1 mmol/L 3.9 3.8 3.9  Chloride 98 - 111 mmol/L 110 106 101  CO2 22 - 32 mmol/L 23 21(L) 21(L)  Calcium 8.9 - 10.3 mg/dL 7.8(L) 7.7(L) 8.1(L)  Total Protein 6.5 - 8.1 g/dL 4.9(L) - 5.9(L)  Total Bilirubin 0.3 - 1.2 mg/dL 1.0 - 1.8(H)  Alkaline Phos 38 - 126 U/L 55 - 65  AST 15 - 41 U/L 29 - 35  ALT 0 - 44 U/L 30 - 33    DIAGNOSTIC IMAGING:  I have independently reviewed the relevant imaging and discussed with the patient.  ASSESSMENT & PLAN: 1.  Pancytopenia with macrocytic anemia - Labs obtained at external lab [facility]) on 04/07/2021 showed new pancytopenia with WBC 2.3 (ANC 1.4, lymphocytes 0.6), platelets 113, Hgb 11.4 with MCV 111.0 - Nutritional panel (04/22/2021) was unremarkable: Normal methylmalonic acid/B12, copper, iron, folate - MGUS panel (04/22/2021): Immunofixation and SPEP were negative for monoclonal gammopathy, light chains unremarkable (mildly elevated kappa 30.3, but normal lambda and normal ratio); normal LDH - Pathology smear review (04/22/2021): Macrocytic anemia with polychromasia, occasional teardrop and RBC fragments, leukopenia with neutropenia - Patient presented to ED on 05/06/2021 and was noted to have worsening pancytopenia, therefore mercaptopurine and methotrexate were discontinued (ED provider discussed with on-call GI as well, who agreed) - Medications discontinued from bubble pack???  *** - Symptoms *** - Labs today (06/23/2021): *** - Differential diagnosis favors pancytopenia secondary to medication effect (mercaptopurine and methotrexate), but if blood counts do not improve after these medications were stopped, may need to consider bone marrow biopsy - PLAN: ***  2.  Unintentional weight loss - Patient  reports poor appetite and about a 10 pound unintentional weight loss since November 2021 - Referral sent to dietitian - Weight today is improved *** - PLAN: We will continue to monitor at follow-up visit.  If continued significant weight loss, would consider imaging studies if indicated.   3.  Recurrent DVTs with history of PE - Initial VTE events in 2009, around the same time the patient had colostomy placed - History of pulmonary embolism with CT angio on 01/31/2008 which was positive for PE and bilateral lower lobe pulmonary infarcts, likely source suspected to be left subclavian Port-A-Cath which appeared to have contiguous embolus extending from the entry site into the left subclavian vein into the SVC - Venous ultrasound imaging (01/31/2008): Extensive deep vein thrombosis of left upper extremity extending into the jugular vein - Hypercoagulable panel was performed in 2009, which showed normal protein S, was negative for factor V Leiden gene mutation - Left lower extremity venous duplex (06/05/2020): Probable superficial thrombophlebitis in the left superficial greater saphenous vein - Patient reported that symptoms had worsened, and a repeat venous duplex performed in person [place] on 06/19/2020 showed positive DVT involving common left femoral, profundofemoral, and superficial femoral veins - he was started on Eliquis at this time - Most recent venous duplex of left lower extremity (12/02/2020): Nonocclusive thrombus  within the common femoral, profundofemoral, and superficial femoral veins on the left, felt to represent chronic recanalized thrombus - Patient has family history of unprovoked VTE in several family members on his mother side - Further hypercoagulable work-up has been deferred, as it would not change the patient's need for lifelong anticoagulation at this point - Patient currently on Eliquis 5 mg twice daily (started after unprovoked DVT in September 2021), has previously been on  warfarin (for treatment of upper extremity DVT/PE in 2009) - Most recent D-dimer (04/15/2021): 0.31 - PLAN: Continue Eliquis (indefinite anticoagulation as long as benefits outweigh risks)  4.  Crohn's disease & rheumatoid arthritis - Per external notes, disease was previously well controlled on methotrexate and mercaptopurine, but these unfortunately had to be discontinued due to worsening pancytopenia - Started on Humira instead.  Unfortunately, he developed severe UTI while on Humira, so it was discontinued. - He has been referred to an IBD clinic for specialized management of his Crohn's disease.  5.  Other history - Patient has extensive other conditions including Crohn's disease, bipolar affective disorder, asthma, and other conditions noted elsewhere in medical record   PLAN SUMMARY & DISPOSITION: ***  All questions were answered. The patient knows to call the clinic with any problems, questions or concerns.  Medical decision making: ***  Time spent on visit: I spent {CHL ONC TIME VISIT - YMEBR:[id number]} counseling the patient face to face. The total time spent in the appointment was {CHL ONC TIME VISIT - SUPJS:[id number]} and more than 50% was on counseling.   Harriett Rush, PA-C  ***

## 2021-06-22 ENCOUNTER — Inpatient Hospital Stay (HOSPITAL_COMMUNITY): Payer: Medicaid Other | Admitting: Physician Assistant

## 2021-06-22 ENCOUNTER — Encounter (HOSPITAL_COMMUNITY): Payer: Medicaid Other | Admitting: Dietician

## 2021-06-22 ENCOUNTER — Inpatient Hospital Stay (HOSPITAL_COMMUNITY): Payer: Medicaid Other

## 2021-07-07 ENCOUNTER — Telehealth: Payer: Self-pay | Admitting: Internal Medicine

## 2021-07-07 NOTE — Telephone Encounter (Signed)
Called [facility] as referral was sent 9/13. Was advised referral is still in review. It can take up to 4 weeks for the process.   Called pt and is aware

## 2021-07-07 NOTE — Telephone Encounter (Signed)
PATIENT CALLED TO LET us KNOW THAT HE STILL HAS NOT HEARD FROM [facility].  WANTED TO MAKE SURE THEY WERE AWARE

## 2021-07-15 ENCOUNTER — Ambulatory Visit (INDEPENDENT_AMBULATORY_CARE_PROVIDER_SITE_OTHER): Payer: Medicaid Other | Admitting: Urology

## 2021-07-15 ENCOUNTER — Other Ambulatory Visit: Payer: Self-pay

## 2021-07-15 ENCOUNTER — Encounter: Payer: Self-pay | Admitting: Urology

## 2021-07-15 VITALS — BP 93/58 | HR 83

## 2021-07-15 DIAGNOSIS — N3000 Acute cystitis without hematuria: Secondary | ICD-10-CM | POA: Diagnosis not present

## 2021-07-15 DIAGNOSIS — N138 Other obstructive and reflux uropathy: Secondary | ICD-10-CM

## 2021-07-15 DIAGNOSIS — R300 Dysuria: Secondary | ICD-10-CM

## 2021-07-15 DIAGNOSIS — R351 Nocturia: Secondary | ICD-10-CM | POA: Diagnosis not present

## 2021-07-15 DIAGNOSIS — N401 Enlarged prostate with lower urinary tract symptoms: Secondary | ICD-10-CM | POA: Diagnosis not present

## 2021-07-15 DIAGNOSIS — N400 Enlarged prostate without lower urinary tract symptoms: Secondary | ICD-10-CM

## 2021-07-15 LAB — URINALYSIS, ROUTINE W REFLEX MICROSCOPIC
Bilirubin, UA: NEGATIVE
Glucose, UA: NEGATIVE
Ketones, UA: NEGATIVE
Nitrite, UA: POSITIVE — AB
Protein,UA: NEGATIVE
Specific Gravity, UA: 1.01 (ref 1.005–1.030)
Urobilinogen, Ur: 0.2 mg/dL (ref 0.2–1.0)
pH, UA: 7 (ref 5.0–7.5)

## 2021-07-15 LAB — MICROSCOPIC EXAMINATION
Renal Epithel, UA: NONE SEEN /hpf
WBC, UA: >30 /hpf — AB (ref 0–5)

## 2021-07-15 MED ORDER — DOXYCYCLINE HYCLATE 100 MG PO CAPS: 100.0000 mg | ORAL_CAPSULE | Freq: Two times a day (BID) | ORAL | 0 refills | Status: DC

## 2021-07-15 NOTE — Progress Notes (Deleted)
Urological Symptom Review  Patient is experiencing the following symptoms:    Review of Systems  Gastrointestinal (upper)  : Negative for upper GI symptoms  Gastrointestinal (lower) : Negative for lower GI symptoms  Constitutional : Negative for symptoms  Skin: Negative for skin symptoms  Eyes: Negative for eye symptoms  Ear/Nose/Throat : Negative for Ear/Nose/Throat symptoms  Hematologic/Lymphatic: Negative for Hematologic/Lymphatic symptoms  Cardiovascular : Negative for cardiovascular symptoms  Respiratory : Negative for respiratory symptoms  Endocrine: Negative for endocrine symptoms  Musculoskeletal: Negative for musculoskeletal symptoms  Neurological: Negative for neurological symptoms  Psychologic: Negative for psychiatric symptoms

## 2021-07-15 NOTE — Patient Instructions (Signed)
Benign Prostatic Hyperplasia Benign prostatic hyperplasia (BPH) is an enlarged prostate gland that is caused by the normal aging process and not by cancer. The prostate is a walnut-sized gland that is involved in the production of semen. It is located in front of the rectum and below the bladder. The bladder stores urine and the urethra is the tube that carries the urine out of the body. The prostate may get bigger as a man gets older. An enlarged prostate can press on the urethra. This can make it harder to pass urine. The build-up of urine in the bladder can cause infection. Back pressure and infection may progress to bladder damage and kidney (renal) failure. What are the causes? This condition is part of a normal aging process. However, not all men develop problems from this condition. If the prostate enlarges away from the urethra, urine flow will not be blocked. If it enlarges toward the urethra and compresses it, there will be problems passing urine. What increases the risk? This condition is more likely to develop in men over the age of 63 years. What are the signs or symptoms? Symptoms of this condition include: Getting up often during the night to urinate. Needing to urinate frequently during the day. Difficulty starting urine flow. Decrease in size and strength of your urine stream. Leaking (dribbling) after urinating. Inability to pass urine. This needs immediate treatment. Inability to completely empty your bladder. Pain when you pass urine. This is more common if there is also an infection. Urinary tract infection (UTI). How is this diagnosed? This condition is diagnosed based on your medical history, a physical exam, and your symptoms. Tests will also be done, such as: A post-void bladder scan. This measures any amount of urine that may remain in your bladder after you finish urinating. A digital rectal exam. In a rectal exam, your health care provider checks your prostate by  putting a lubricated, gloved finger into your rectum to feel the back of your prostate gland. This exam detects the size of your gland and any abnormal lumps or growths. An exam of your urine (urinalysis). A prostate specific antigen (PSA) screening. This is a blood test used to screen for prostate cancer. An ultrasound. This test uses sound waves to electronically produce a picture of your prostate gland. Your health care provider may refer you to a specialist in kidney and prostate diseases (urologist). How is this treated? Once symptoms begin, your health care provider will monitor your condition (active surveillance or watchful waiting). Treatment for this condition will depend on the severity of your condition. Treatment may include: Observation and yearly exams. This may be the only treatment needed if your condition and symptoms are mild. Medicines to relieve your symptoms, including: Medicines to shrink the prostate. Medicines to relax the muscle of the prostate. Surgery in severe cases. Surgery may include: Prostatectomy. In this procedure, the prostate tissue is removed completely through an open incision or with a laparoscope or robotics. Transurethral resection of the prostate (TURP). In this procedure, a tool is inserted through the opening at the tip of the penis (urethra). It is used to cut away tissue of the inner core of the prostate. The pieces are removed through the same opening of the penis. This removes the blockage. Transurethral incision (TUIP). In this procedure, small cuts are made in the prostate. This lessens the prostate's pressure on the urethra. Transurethral microwave thermotherapy (TUMT). This procedure uses microwaves to create heat. The heat destroys and removes a  small amount of prostate tissue. Transurethral needle ablation (TUNA). This procedure uses radio frequencies to destroy and remove a small amount of prostate tissue. Interstitial laser coagulation ([place]).  This procedure uses a laser to destroy and remove a small amount of prostate tissue. Transurethral electrovaporization (TUVP). This procedure uses electrodes to destroy and remove a small amount of prostate tissue. Prostatic urethral lift. This procedure inserts an implant to push the lobes of the prostate away from the urethra. Follow these instructions at home: Take over-the-counter and prescription medicines only as told by your health care provider. Monitor your symptoms for any changes. Contact your health care provider with any changes. Avoid drinking large amounts of liquid before going to bed or out in public. Avoid or reduce how much caffeine or alcohol you drink. Give yourself time when you urinate. Keep all follow-up visits as told by your health care provider. This is important. Contact a health care provider if: You have unexplained back pain. Your symptoms do not get better with treatment. You develop side effects from the medicine you are taking. Your urine becomes very dark or has a bad smell. Your lower abdomen becomes distended and you have trouble passing your urine. Get help right away if: You have a fever or chills. You suddenly cannot urinate. You feel lightheaded, or very dizzy, or you faint. There are large amounts of blood or clots in the urine. Your urinary problems become hard to manage. You develop moderate to severe low back or flank pain. The flank is the side of your body between the ribs and the hip. These symptoms may represent a serious problem that is an emergency. Do not wait to see if the symptoms will go away. Get medical help right away. Call your local emergency services (911 in the [place] Do not drive yourself to the hospital. Summary Benign prostatic hyperplasia (BPH) is an enlarged prostate that is caused by the normal aging process and not by cancer. An enlarged prostate can press on the urethra. This can make it hard to pass urine. This  condition is part of a normal aging process and is more likely to develop in men over the age of 8 years. Get help right away if you suddenly cannot urinate. This information is not intended to replace advice given to you by your health care provider. Make sure you discuss any questions you have with your health care provider. Document Revised: 12/31/2020 Document Reviewed: 05/29/2020 Elsevier Patient Education  [place].

## 2021-07-15 NOTE — Progress Notes (Signed)
07/15/2021 2:57 PM   ROBERTT BUDA 08-28-1966 [id number]  Referring provider: [name], MD [address] [place],   [id number]  Followup BPH   HPI: [name] is a 55yo here for followup for BPh with nocturia. He has been doing well since his TURP 18 months ago. His only issue recently in recurrent UTIs. He has been treated 4 times in the past 3 months for a UTI. UA today is concerning for infection. He has intermittent dysuria with urination. He denies a hx of nephrolithiasis. No gross hematuria   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[place])    right, s/p replacement, due to prednisone   Bipolar disorder ([place])    Colostomy in place [facility])    Crohn's colitis ([place])    s/p total colectomy with ileostomy in 2009   Crohn's disease of small intestine [facility]) Sept 2012   ileoscopy: multiple ulcers likely secondary to [place], HX OF 02/11/2009   Qualifier: Diagnosis of  By: Zeb Comfort     Depression    DVT (deep venous thrombosis) ([place]) 2009   right upper extremity due to PICC   Enterococcus UTI 2009   GERD (gastroesophageal reflux disease)    Hernia    Hyperlipidemia    Insomnia    Migraine headache    Nystagmus    PULMONARY EMBOLISM, HX OF 02/11/2009   Qualifier: Diagnosis of  By: Zeb Comfort     S/P endoscopy Sept 2012   mild gastritis, small hiatal hernia, no ulcers   Schizophrenia ([place])    Schizophrenia, acute ([place])     Surgical History: Past Surgical History:  Procedure Laterality Date   APPENDECTOMY     ESOPHAGOGASTRODUODENOSCOPY  07/2010   gastritis,  bx neg for H.Pylori   ESOPHAGOGASTRODUODENOSCOPY N/A 01/08/2014   SLF:NO SOURCE FOR ODYNOPHAGIA IDENTIFIED/Multiple small ulcers in the gastric antrum   EYE SURGERY     ILEOSCOPY  06/29/2011   SLF: ulcers,multiple/small HH/mild gastritis   KIDNEY SURGERY     LAPAROTOMY N/A 06/05/2013   Procedure:  EXPLORATORY LAPAROTOMY;  Surgeon: Donato Heinz, MD;  Location: [facility];  Service: General;  Laterality: N/A;   LYSIS OF ADHESION N/A 06/05/2013   Procedure: LYSIS OF ADHESIONS;  Surgeon: Donato Heinz, MD;  Location: [facility];  Service: General;  Laterality: N/A;   small bowel capsule study  08/2010   few tiny nonbleeding erosions/ulcers ?secondary to relafen or Crohn's. Entire small bowel not seen.    TOTAL COLECTOMY  2009   with ileostomy at [facility] for refractory disease    TOTAL HIP ARTHROPLASTY     right, due to avascular necrosis from chronic prednisone   TOTAL SHOULDER REPLACEMENT     bilateral   TRANSURETHRAL RESECTION OF PROSTATE N/A 12/03/2019   Procedure: TRANSURETHRAL RESECTION OF THE PROSTATE (TURP);  Surgeon: Cleon Gustin, MD;  Location: [facility];  Service: Urology;  Laterality: N/A;    Home Medications:  Allergies as of 07/15/2021       Reactions   Bactrim [sulfamethoxazole-trimethoprim] Other (See Comments)   ABD CRAMPS AND DIARRHEA   Oxycodone-acetaminophen Other (See Comments)   Other reaction(s): Swelling   Remicade [infliximab] Other (See Comments)   COULDN'T BREATHE   Codeine    REACTION: unknown reaction   Ibuprofen    REACTION: unknown reaction   Penicillins    REACTION:  unknown reaction   Tramadol Hcl    REACTION: unknown reaction   Aspirin    REACTION: unknown reaction   Tramadol Nausea Only   Other reaction(s): Nausea   Vicodin [hydrocodone-acetaminophen] Nausea Only        Medication List        Accurate as of July 15, 2021  2:57 PM. If you have any questions, ask your nurse or doctor.          Accu-Chek Aviva Plus test strip Generic drug: glucose blood SMARTSIG:Via Meter   Active Life 1-Pc Drain 19-64mm Pouch Misc   albuterol (2.5 MG/3ML) 0.083% nebulizer solution Commonly known as: PROVENTIL Take 2.5 mg by nebulization every 6 (six) hours as needed for wheezing or shortness of breath.   albuterol 108 (90 Base) MCG/ACT  inhaler Commonly known as: VENTOLIN HFA Inhale 2 puffs into the lungs every 6 (six) hours as needed for wheezing or shortness of breath.   atorvastatin 80 MG tablet Commonly known as: LIPITOR Take 80 mg by mouth daily.   azelastine 0.1 % nasal spray Commonly known as: ASTELIN Place 2 sprays into both nostrils 2 (two) times daily.   B-12 1000 MCG Caps Take 1 capsule by mouth daily.   baclofen 10 MG tablet Commonly known as: LIORESAL Take 10 mg by mouth daily.   benztropine 0.5 MG tablet Commonly known as: COGENTIN Take 0.5 mg by mouth daily.   Calcium 600/Vitamin D3 600-800 MG-UNIT Tabs Generic drug: Calcium Carb-Cholecalciferol TAKE (1) TABLET BY MOUTH THREE TIMES DAILY AFTER MEALS.   dicyclomine 10 MG capsule Commonly known as: BENTYL TAKE 1 CAPSULE BY MOUTH BEFORE MEALS AND AT BEDTIME AS NEEDED FOR ABDOMINAL CRAMPING. *HOLD FOR CONSTIPATION*   divalproex 500 MG 24 hr tablet Commonly known as: DEPAKOTE ER Take 1,000 mg by mouth every evening.   divalproex 250 MG DR tablet Commonly known as: DEPAKOTE Take by mouth at bedtime.   docusate sodium 100 MG capsule Commonly known as: COLACE Take 1 capsule (100 mg total) by mouth 2 (two) times daily.   Dulera 200-5 MCG/ACT Aero Generic drug: mometasone-formoterol Inhale 2 puffs into the lungs in the morning and at bedtime.   Eliquis 5 MG Tabs tablet Generic drug: apixaban Take 5 mg by mouth 2 (two) times daily.   Flovent HFA 110 MCG/ACT inhaler Generic drug: fluticasone Inhale 2 puffs into the lungs 2 (two) times daily.   furosemide 40 MG tablet Commonly known as: LASIX Take 40 mg by mouth daily.   gabapentin 300 MG capsule Commonly known as: NEURONTIN Take 900 mg by mouth 3 (three) times daily.   meloxicam 7.5 MG tablet Commonly known as: MOBIC Take 7.5 mg by mouth daily.   metFORMIN 500 MG 24 hr tablet Commonly known as: GLUCOPHAGE-XR Take 1,000 mg by mouth 2 (two) times daily.   metoprolol succinate  50 MG 24 hr tablet Commonly known as: TOPROL-XL Take 1 tablet (50 mg total) by mouth daily. Take with or immediately following a meal.   nitroGLYCERIN 0.4 MG SL tablet Commonly known as: NITROSTAT Place 1 tablet (0.4 mg total) under the tongue every 5 (five) minutes as needed for chest pain.   pantoprazole 40 MG tablet Commonly known as: PROTONIX TAKE (1) TABLET BY MOUTH TWICE A DAY BEFORE MEALS. (BREAKFAST AND SUPPER) What changed: See the new instructions.   potassium chloride SA 20 MEQ tablet Commonly known as: KLOR-CON Take 20 mEq by mouth daily.   risperiDONE 1 MG tablet Commonly known as: RISPERDAL  Take 1 tablet by mouth at bedtime.   risperiDONE 3 MG tablet Commonly known as: RISPERDAL Take 3 mg by mouth 2 (two) times daily.   sertraline 50 MG tablet Commonly known as: ZOLOFT Take 50 mg by mouth daily.   Spiriva Respimat 2.5 MCG/ACT Aers Generic drug: Tiotropium Bromide Monohydrate Inhale 2 puffs into the lungs daily.   tamsulosin 0.4 MG Caps capsule Commonly known as: FLOMAX Take 0.4 mg by mouth daily.   traZODone 100 MG tablet Commonly known as: DESYREL Take 100 mg by mouth at bedtime.   Walker Wheels Misc USE AS DIRECTED        Allergies:  Allergies  Allergen Reactions   Bactrim [Sulfamethoxazole-Trimethoprim] Other (See Comments)    ABD CRAMPS AND DIARRHEA   Oxycodone-Acetaminophen Other (See Comments)    Other reaction(s): Swelling   Remicade [Infliximab] Other (See Comments)    COULDN'T BREATHE   Codeine     REACTION: unknown reaction   Ibuprofen     REACTION: unknown reaction   Penicillins     REACTION: unknown reaction   Tramadol Hcl     REACTION: unknown reaction   Aspirin     REACTION: unknown reaction   Tramadol Nausea Only    Other reaction(s): Nausea   Vicodin [Hydrocodone-Acetaminophen] Nausea Only    Family History: Family History  Problem Relation Age of Onset   Diabetes Mother    Heart disease Mother    Diabetes  Father    Heart disease Father    COPD Maternal Grandmother    Colon cancer Neg Hx     Social History:  reports that he has been smoking cigarettes. He has a 148.00 pack-year smoking history. He has never used smokeless tobacco. He reports that he does not drink alcohol and does not use drugs.  ROS: All other review of systems were reviewed and are negative except what is noted above in HPI  Physical Exam: BP (!) 93/58   Pulse 83   Constitutional:  Alert and oriented, No acute distress. HEENT: [place] AT, moist mucus membranes.  Trachea midline, no masses. Cardiovascular: No clubbing, cyanosis, or edema. Respiratory: Normal respiratory effort, no increased work of breathing. GI: Abdomen is soft, nontender, nondistended, no abdominal masses GU: No CVA tenderness.  Lymph: No cervical or inguinal lymphadenopathy. Skin: No rashes, bruises or suspicious lesions. Neurologic: Grossly intact, no focal deficits, moving all 4 extremities. Psychiatric: Normal mood and affect.  Laboratory Data: Lab Results  Component Value Date   WBC 4.3 05/18/2021   HGB 8.1 (L) 05/18/2021   HCT 23.8 (L) 05/18/2021   MCV 119.6 (H) 05/18/2021   PLT 143 (L) 05/18/2021    Lab Results  Component Value Date   CREATININE 0.61 05/18/2021    No results found for: PSA  No results found for: TESTOSTERONE  Lab Results  Component Value Date   HGBA1C 4.8 05/17/2021    Urinalysis    Component Value Date/Time   COLORURINE YELLOW 05/16/2021 1411   APPEARANCEUR HAZY (A) 05/16/2021 1411   LABSPEC 1.016 05/16/2021 1411   PHURINE 6.0 05/16/2021 1411   GLUCOSEU NEGATIVE 05/16/2021 1411   HGBUR SMALL (A) 05/16/2021 1411   BILIRUBINUR NEGATIVE 05/16/2021 1411   KETONESUR NEGATIVE 05/16/2021 1411   PROTEINUR NEGATIVE 05/16/2021 1411   UROBILINOGEN 0.2 10/17/2014 1940   NITRITE POSITIVE (A) 05/16/2021 1411   LEUKOCYTESUR TRACE (A) 05/16/2021 1411    Lab Results  Component Value Date   BACTERIA RARE (A)  05/16/2021  Pertinent Imaging:  No results found for this or any previous visit.  No results found for this or any previous visit.  No results found for this or any previous visit.  No results found for this or any previous visit.  Results for orders placed during the hospital encounter of 04/27/16  US Renal  Narrative CLINICAL DATA:  Bladder neck alpha obstruction. Recurrent UTIs. History of Crohn's disease. Colostomy. EXAM: RENAL / URINARY TRACT ULTRASOUND COMPLETE COMPARISON:  CT 03/05/2014 . FINDINGS: Right Kidney: Length: 12.6 cm. Echogenicity within normal limits. No mass or hydronephrosis visualized. Left Kidney: Length: 11.9 cm. Echogenicity within normal limits. No mass or hydronephrosis visualized. Bladder: Appears normal for degree of bladder distention. Bilateral ureteral jets noted. IMPRESSION: No acute or focal abnormality. Electronically Signed By: Marcello Moores  Register On: 04/27/2016 13:37  No results found for this or any previous visit.  No results found for this or any previous visit.  No results found for this or any previous visit.   Assessment & Plan:    1. Benign prostatic hyperplasia with urinary obstruction -continue flomax 0.57m daily - Urinalysis, Routine w reflex microscopic  2. Nocturia -continue flomax 0.442mdaily - Urinalysis, Routine w reflex microscopic  3. Dysuria with recurrent UTI -Urine for culture -doxycycline - Urinalysis, Routine w reflex microscopic   No follow-ups on file.  PaNicolette BangMD  [facility]

## 2021-07-15 NOTE — Progress Notes (Signed)
Urological Symptom Review  Patient is experiencing the following symptoms: Burning/pain with urination   Review of Systems  Gastrointestinal (upper)  : Negative for upper GI symptoms  Gastrointestinal (lower) : Negative for lower GI symptoms  Constitutional : Negative for symptoms  Skin: Negative for skin symptoms  Eyes: Negative for eye symptoms  Ear/Nose/Throat : Negative for Ear/Nose/Throat symptoms  Hematologic/Lymphatic: Negative for Hematologic/Lymphatic symptoms  Cardiovascular : Negative for cardiovascular symptoms  Respiratory : Negative for respiratory symptoms  Endocrine: Negative for endocrine symptoms  Musculoskeletal: Negative for musculoskeletal symptoms  Neurological: Negative for neurological symptoms  Psychologic: Negative for psychiatric symptoms

## 2021-07-16 LAB — BLADDER SCAN AMB NON-IMAGING: Scan Result: 31

## 2021-07-16 NOTE — Progress Notes (Signed)
post void residual=31

## 2021-07-17 LAB — URINE CULTURE

## 2021-07-20 ENCOUNTER — Telehealth: Payer: Self-pay

## 2021-07-20 ENCOUNTER — Encounter (HOSPITAL_COMMUNITY): Payer: Medicaid Other | Admitting: Dietician

## 2021-07-20 ENCOUNTER — Telehealth (HOSPITAL_COMMUNITY): Payer: Self-pay | Admitting: Dietician

## 2021-07-20 DIAGNOSIS — N3000 Acute cystitis without hematuria: Secondary | ICD-10-CM

## 2021-07-20 DIAGNOSIS — R300 Dysuria: Secondary | ICD-10-CM

## 2021-07-20 MED ORDER — CIPROFLOXACIN HCL 500 MG PO TABS: 500.0000 mg | ORAL_TABLET | Freq: Once | ORAL | Status: DC

## 2021-07-20 NOTE — Telephone Encounter (Signed)
Nutrition Follow-up:   Patient with recurrent DVT and pancytopenia.   Spoke with patient via telephone. He reports his appetite has improved, he is better and has gained some weight. Patient recalls he is back up to 202 lb from prior 197 lb. Patient says he has been eating a little bit of everything. Patient has eaten a sub today. He did not eat any breakfast because he had a doctors appointment. He continues to drink Ensure most everyday. Patient would like to have more coupons mailed to him. Patient reports A1c has dropped to 4.8 and no longer having to take metformin. Patient reports his energy level has improved and feeling great.   Medications: reviewed  Labs: 8/14 A1c - 4.8  Anthropometrics: Last weight 198 lb on 9/7 decreased 7 lbs (3.4%) from 205 lb 4 oz on 8/13. This is insignificant for time frame.   NUTRITION DIAGNOSIS: Inadequate oral intake appears improved   INTERVENTION:  Congratulated patient on improving HgbA1c, no longer requiring medication Reviewed tips for managing blood glucose Continue Ensure Plus/equivalent as needed with decreased appetite - will mail coupons Patient has contact information      MONITORING, EVALUATION, GOAL: weight trends, intake   NEXT VISIT: No follow-up scheduled at this time, patient encouraged to contact with nutrition questions/concerns

## 2021-07-20 NOTE — Progress Notes (Signed)
[employer], [place] [address]   CLINIC:  Medical Oncology/Hematology  PCP:  [name], MD [address] [place] [address] [phone]   REASON FOR VISIT:  Follow-up for pancytopenia, recurrent DVTs   CURRENT THERAPY: Eliquis   INTERVAL HISTORY:  [name] 55 y.o. male returns for routine follow-up of pancytopenia and recurrent DVTs.  He was last seen by Tarri Abernethy PA-C on 05/11/2021.     At today's visit, he reports feeling well.  No recent hospitalizations, surgeries, or changes in baseline health status.   He continues to take Eliquis and reports easy bruising. He denies any abnormal blood loss such as bright red blood per rectum or melena, no blood in ostomy. He reports that his left lower extremity edema and pain has improved since his last visit, but still comes and goes. He denies any chest pain, dyspnea on exertion, or hemoptysis.  He reports that his fatigue and night sweats have resolved.  No unexplained fever or chills.  He reports that his appetite has improved and his weight has been more stable.  No significant weight loss since his last visit.   At his last visit, his methotrexate and mercaptopurine were discontinued due to his pancytopenia.  He reports that he was started on Humira instead.  Unfortunately, he developed severe UTI while on Humira, so it was discontinued.  He has been referred to an IBD clinic for specialized management of his Crohn's disease, but is still awaiting appointment.  He reports that he currently has another UTI, diagnosed on 07/15/2021.  He was initially started on doxycycline, but this was changed to ciprofloxacin, which she started today (07/21/2021).  He has 75% energy and 100% appetite. He endorses that he is maintaining a stable weight.   REVIEW OF SYSTEMS:  Review of Systems  Constitutional:  Negative for appetite change, chills, diaphoresis, fatigue, fever and unexpected  weight change.  HENT:   Negative for lump/mass and nosebleeds.   Eyes:  Negative for eye problems.  Respiratory:  Positive for shortness of breath (COPD, smoker). Negative for cough and hemoptysis.   Cardiovascular:  Negative for chest pain, leg swelling and palpitations.  Gastrointestinal:  Negative for abdominal pain, blood in stool, constipation, diarrhea, nausea and vomiting.  Genitourinary:  Positive for dysuria. Negative for hematuria.        Current UTI, on antibiotics  Skin: Negative.   Neurological:  Negative for dizziness, headaches and light-headedness.  Hematological:  Does not bruise/bleed easily.     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[place])    right, s/p replacement, due to prednisone   Bipolar disorder ([place])    Colostomy in place [facility])    Crohn's colitis ([place])    s/p total colectomy with ileostomy in 2009   Crohn's disease of small intestine [facility]) Sept 2012   ileoscopy: multiple ulcers likely secondary to [place], HX OF 02/11/2009   Qualifier: Diagnosis of  By: Zeb Comfort     Depression    DVT (deep venous thrombosis) ([place]) 2009   right upper extremity due to PICC   Enterococcus UTI 2009   GERD (gastroesophageal reflux disease)    Hernia    Hyperlipidemia    Insomnia    Migraine headache    Nystagmus    PULMONARY EMBOLISM, HX OF 02/11/2009  Qualifier: Diagnosis of  By: Zeb Comfort     S/P endoscopy Sept 2012   mild gastritis, small hiatal hernia, no ulcers   Schizophrenia ([place])    Schizophrenia, acute ([place])    Past Surgical History:  Procedure Laterality Date   APPENDECTOMY     ESOPHAGOGASTRODUODENOSCOPY  07/2010   gastritis,  bx neg for H.Pylori   ESOPHAGOGASTRODUODENOSCOPY N/A 01/08/2014   SLF:NO SOURCE FOR ODYNOPHAGIA IDENTIFIED/Multiple small ulcers in the gastric antrum   EYE SURGERY     ILEOSCOPY  06/29/2011   SLF:  ulcers,multiple/small HH/mild gastritis   KIDNEY SURGERY     LAPAROTOMY N/A 06/05/2013   Procedure: EXPLORATORY LAPAROTOMY;  Surgeon: Donato Heinz, MD;  Location: [facility];  Service: General;  Laterality: N/A;   LYSIS OF ADHESION N/A 06/05/2013   Procedure: LYSIS OF ADHESIONS;  Surgeon: Donato Heinz, MD;  Location: [facility];  Service: General;  Laterality: N/A;   small bowel capsule study  08/2010   few tiny nonbleeding erosions/ulcers ?secondary to relafen or Crohn's. Entire small bowel not seen.    TOTAL COLECTOMY  2009   with ileostomy at [facility] for refractory disease    TOTAL HIP ARTHROPLASTY     right, due to avascular necrosis from chronic prednisone   TOTAL SHOULDER REPLACEMENT     bilateral   TRANSURETHRAL RESECTION OF PROSTATE N/A 12/03/2019   Procedure: TRANSURETHRAL RESECTION OF THE PROSTATE (TURP);  Surgeon: Cleon Gustin, MD;  Location: [facility];  Service: Urology;  Laterality: N/A;     SOCIAL HISTORY:  Social History   Socioeconomic History   Marital status: Single    Spouse name: Not on file   Number of children: Not on file   Years of education: Not on file   Highest education level: Not on file  Occupational History   Not on file  Tobacco Use   Smoking status: Every Day    Packs/day: 4.00    Years: 37.00    Pack years: 148.00    Types: Cigarettes   Smokeless tobacco: Never   Tobacco comments:    down to 4ppd 11/14/20  Vaping Use   Vaping Use: Never used  Substance and Sexual Activity   Alcohol use: No   Drug use: No   Sexual activity: Never  Other Topics Concern   Not on file  Social History Narrative   Girlfriend of six years, Rectal Cancer on Hospice (05/2011).   Social Determinants of Health   Financial Resource Strain: Not on file  Food Insecurity: Not on file  Transportation Needs: No Transportation Needs   Lack of Transportation (Medical): No   Lack of Transportation (Non-Medical): No  Physical Activity: Not on file  Stress: Not on file   Social Connections: Not on file  Intimate Partner Violence: Not At Risk   Fear of Current or Ex-Partner: No   Emotionally Abused: No   Physically Abused: No   Sexually Abused: No    FAMILY HISTORY:  Family History  Problem Relation Age of Onset   Diabetes Mother    Heart disease Mother    Diabetes Father    Heart disease Father    COPD Maternal Grandmother    Colon cancer Neg Hx     CURRENT MEDICATIONS:  Outpatient Encounter Medications as of 07/21/2021  Medication Sig   ACCU-CHEK AVIVA PLUS test strip SMARTSIG:Via Meter   albuterol (PROVENTIL) (2.5 MG/3ML) 0.083% nebulizer solution Take 2.5 mg by nebulization every 6 (six) hours as needed for wheezing  or shortness of breath.   albuterol (VENTOLIN HFA) 108 (90 Base) MCG/ACT inhaler Inhale 2 puffs into the lungs every 6 (six) hours as needed for wheezing or shortness of breath.   atorvastatin (LIPITOR) 80 MG tablet Take 80 mg by mouth daily.   azelastine (ASTELIN) 0.1 % nasal spray Place 2 sprays into both nostrils 2 (two) times daily.   baclofen (LIORESAL) 10 MG tablet Take 10 mg by mouth daily.   benztropine (COGENTIN) 0.5 MG tablet Take 0.5 mg by mouth daily.    CALCIUM 600/VITAMIN D3 600-800 MG-UNIT TABS TAKE (1) TABLET BY MOUTH THREE TIMES DAILY AFTER MEALS.   Cyanocobalamin (B-12) 1000 MCG CAPS Take 1 capsule by mouth daily.   dicyclomine (BENTYL) 10 MG capsule TAKE 1 CAPSULE BY MOUTH BEFORE MEALS AND AT BEDTIME AS NEEDED FOR ABDOMINAL CRAMPING. *HOLD FOR CONSTIPATION*   divalproex (DEPAKOTE) 250 MG DR tablet Take by mouth at bedtime.   divalproex (DEPAKOTE) 500 MG 24 hr tablet Take 1,000 mg by mouth every evening.   docusate sodium (COLACE) 100 MG capsule Take 1 capsule (100 mg total) by mouth 2 (two) times daily.   doxycycline (VIBRAMYCIN) 100 MG capsule Take 1 capsule (100 mg total) by mouth 2 (two) times daily.   ELIQUIS 5 MG TABS tablet Take 5 mg by mouth 2 (two) times daily.   FLOVENT HFA 110 MCG/ACT inhaler Inhale 2  puffs into the lungs 2 (two) times daily.   furosemide (LASIX) 40 MG tablet Take 40 mg by mouth daily.   gabapentin (NEURONTIN) 300 MG capsule Take 900 mg by mouth 3 (three) times daily.   meloxicam (MOBIC) 7.5 MG tablet Take 7.5 mg by mouth daily.   metFORMIN (GLUCOPHAGE-XR) 500 MG 24 hr tablet Take 1,000 mg by mouth 2 (two) times daily.   metoprolol succinate (TOPROL-XL) 50 MG 24 hr tablet Take 1 tablet (50 mg total) by mouth daily. Take with or immediately following a meal.   Misc. Devices (WALKER WHEELS) MISC USE AS DIRECTED   mometasone-formoterol (DULERA) 200-5 MCG/ACT AERO Inhale 2 puffs into the lungs in the morning and at bedtime.   nitroGLYCERIN (NITROSTAT) 0.4 MG SL tablet Place 1 tablet (0.4 mg total) under the tongue every 5 (five) minutes as needed for chest pain.   Ostomy Supplies (ACTIVE LIFE 1-PC DRAIN [phone]) Pouch MISC    pantoprazole (PROTONIX) 40 MG tablet TAKE (1) TABLET BY MOUTH TWICE A DAY BEFORE MEALS. (BREAKFAST AND SUPPER) (Patient taking differently: Take 40 mg by mouth 2 (two) times daily before a meal.)   potassium chloride SA (K-DUR,KLOR-CON) 20 MEQ tablet Take 20 mEq by mouth daily.    risperiDONE (RISPERDAL) 1 MG tablet Take 1 tablet by mouth at bedtime.   risperiDONE (RISPERDAL) 3 MG tablet Take 3 mg by mouth 2 (two) times daily.   sertraline (ZOLOFT) 50 MG tablet Take 50 mg by mouth daily.   tamsulosin (FLOMAX) 0.4 MG CAPS capsule Take 0.4 mg by mouth daily.   Tiotropium Bromide Monohydrate (SPIRIVA RESPIMAT) 2.5 MCG/ACT AERS Inhale 2 puffs into the lungs daily.   traZODone (DESYREL) 100 MG tablet Take 100 mg by mouth at bedtime.   Facility-Administered Encounter Medications as of 07/21/2021  Medication   ciprofloxacin (CIPRO) tablet 500 mg    ALLERGIES:  Allergies  Allergen Reactions   Bactrim [Sulfamethoxazole-Trimethoprim] Other (See Comments)    ABD CRAMPS AND DIARRHEA   Oxycodone-Acetaminophen Other (See Comments)    Other reaction(s): Swelling    Remicade [Infliximab] Other (See Comments)  COULDN'T BREATHE   Codeine     REACTION: unknown reaction   Ibuprofen     REACTION: unknown reaction   Penicillins     REACTION: unknown reaction   Tramadol Hcl     REACTION: unknown reaction   Aspirin     REACTION: unknown reaction   Tramadol Nausea Only    Other reaction(s): Nausea   Vicodin [Hydrocodone-Acetaminophen] Nausea Only     PHYSICAL EXAM:  ECOG PERFORMANCE STATUS: 1 - Symptomatic but completely ambulatory  There were no vitals filed for this visit. There were no vitals filed for this visit. Physical Exam Constitutional:      Appearance: Normal appearance. He is obese.  HENT:     Head: Normocephalic and atraumatic.     Mouth/Throat:     Mouth: Mucous membranes are moist.  Eyes:     Extraocular Movements: Extraocular movements intact.     Right eye: Nystagmus present.     Left eye: Nystagmus present.     Pupils: Pupils are equal, round, and reactive to light.  Cardiovascular:     Rate and Rhythm: Normal rate and regular rhythm.     Pulses: Normal pulses.     Heart sounds: Normal heart sounds.  Pulmonary:     Effort: Pulmonary effort is normal.     Breath sounds: Wheezing (COPD) present.  Abdominal:     General: Bowel sounds are normal.     Palpations: Abdomen is soft.     Tenderness: There is no abdominal tenderness.  Musculoskeletal:        General: No swelling.     Right lower leg: No edema.     Left lower leg: No edema.  Lymphadenopathy:     Cervical: No cervical adenopathy.  Skin:    General: Skin is warm and dry.  Neurological:     General: No focal deficit present.     Mental Status: He is alert and oriented to person, place, and time.  Psychiatric:        Mood and Affect: Mood normal.        Behavior: Behavior normal.     LABORATORY DATA:  I have reviewed the labs as listed.  CBC    Component Value Date/Time   WBC 4.3 05/18/2021 0653   RBC 1.99 (L) 05/18/2021 0653   HGB 8.1 (L)  05/18/2021 0653   HCT 23.8 (L) 05/18/2021 0653   HCT 42 05/24/2011 1417   PLT 143 (L) 05/18/2021 0653   MCV 119.6 (H) 05/18/2021 0653   MCH 40.7 (H) 05/18/2021 0653   MCHC 34.0 05/18/2021 0653   RDW 18.0 (H) 05/18/2021 0653   LYMPHSABS 0.4 (L) 05/16/2021 1420   MONOABS 0.8 05/16/2021 1420   EOSABS 0.0 05/16/2021 1420   BASOSABS 0.0 05/16/2021 1420   CMP Latest Ref Rng & Units 05/18/2021 05/17/2021 05/16/2021  Glucose 70 - 99 mg/dL 84 92 106(H)  BUN 6 - 20 mg/dL 13 14 15   Creatinine 0.61 - 1.24 mg/dL 0.61 0.68 0.92  Sodium 135 - 145 mmol/L 140 134(L) 128(L)  Potassium 3.5 - 5.1 mmol/L 3.9 3.8 3.9  Chloride 98 - 111 mmol/L 110 106 101  CO2 22 - 32 mmol/L 23 21(L) 21(L)  Calcium 8.9 - 10.3 mg/dL 7.8(L) 7.7(L) 8.1(L)  Total Protein 6.5 - 8.1 g/dL 4.9(L) - 5.9(L)  Total Bilirubin 0.3 - 1.2 mg/dL 1.0 - 1.8(H)  Alkaline Phos 38 - 126 U/L 55 - 65  AST 15 - 41 U/L 29 - 35  ALT 0 - 44 U/L 30 - 33    DIAGNOSTIC IMAGING:  I have independently reviewed the relevant imaging and discussed with the patient.  ASSESSMENT & PLAN: 1.  Pancytopenia with macrocytic anemia - Labs obtained at external lab [facility]) on 04/07/2021 showed new pancytopenia with WBC 2.3 (ANC 1.4, lymphocytes 0.6), platelets 113, Hgb 11.4 with MCV 111.0 - Nutritional panel (04/22/2021) was unremarkable: Normal methylmalonic acid/B12, copper, iron, folate - MGUS panel (04/22/2021): Immunofixation and SPEP were negative for monoclonal gammopathy, light chains unremarkable (mildly elevated kappa 30.3, but normal lambda and normal ratio); normal LDH - Pathology smear review (04/22/2021): Macrocytic anemia with polychromasia, occasional teardrop and RBC fragments, leukopenia with neutropenia - Patient presented to ED on 05/06/2021 and was noted to have worsening pancytopenia, therefore mercaptopurine and methotrexate were discontinued (ED provider discussed with on-call GI as well, who agreed) -After mercaptopurine and  methotrexate were discontinued, patient was noted to have improved blood counts with labs (05/18/2021) showing WBC 4.3 and platelets 143 - Labs today (07/21/2021) show resolution of macrocytic anemia with Hgb 13.1/MCV 102.1.  He does have recurrent leukopenia (WBC 3.3) and thrombocytopenia (platelets 70), but this is most likely reactive in the setting of acute UTI and antibiotic use (doxycycline + ciprofloxacin) - PLAN: Anemia has resolved.  Recurrent leukopenia and thrombocytopenia most likely due to acute UTI and antibiotics. - We will repeat labs in 8 weeks with follow-up visit after labs. - Continue daily B12 (cyanocobalamin) - If no significant improvement in blood counts after acute causes of pancytopenia have resolved, would consider bone marrow biopsy in the future.   2.  Unintentional weight loss - Patient reports poor appetite and about a 10 pound unintentional weight loss since November 2021 - Referral sent to dietitian - Weight today is stable (201 pounds) - PLAN: We will continue to monitor at follow-up visit.  If continued significant weight loss, would consider imaging studies if indicated.   3.  Recurrent DVTs with history of PE - Initial VTE events in 2009, around the same time the patient had colostomy placed - History of pulmonary embolism with CT angio on 01/31/2008 which was positive for PE and bilateral lower lobe pulmonary infarcts, likely source suspected to be left subclavian Port-A-Cath which appeared to have contiguous embolus extending from the entry site into the left subclavian vein into the SVC - Venous ultrasound imaging (01/31/2008): Extensive deep vein thrombosis of left upper extremity extending into the jugular vein - Hypercoagulable panel was performed in 2009, which showed normal protein S, was negative for factor V Leiden gene mutation - Left lower extremity venous duplex (06/05/2020): Probable superficial thrombophlebitis in the left superficial greater saphenous  vein - Patient reported that symptoms had worsened, and a repeat venous duplex performed in person [place] on 06/19/2020 showed positive DVT involving common left femoral, profundofemoral, and superficial femoral veins - he was started on Eliquis at this time - Most recent venous duplex of left lower extremity (12/02/2020): Nonocclusive thrombus within the common femoral, profundofemoral, and superficial femoral veins on the left, felt to represent chronic recanalized thrombus - Patient has family history of unprovoked VTE in several family members on his mother side - Further hypercoagulable work-up has been deferred, as it would not change the patient's need for lifelong anticoagulation at this point - Patient currently on Eliquis 5 mg twice daily (started after unprovoked DVT in September 2021), has previously been on warfarin (for treatment of upper extremity DVT/PE in 2009) - Most recent D-dimer (  04/15/2021): 0.31 - PLAN: Continue Eliquis (indefinite anticoagulation as long as benefits outweigh risks)   4.  Crohn's disease & rheumatoid arthritis - Per external notes, disease was previously well controlled on methotrexate and mercaptopurine, but these unfortunately had to be discontinued due to worsening pancytopenia - Started on Humira instead.  Unfortunately, he developed severe UTI while on Humira, so it was discontinued. - He has been referred to an IBD clinic for specialized management of his Crohn's disease, but is still awaiting his first appointment with them.   5.  Other history - Patient has extensive other conditions including Crohn's disease, bipolar affective disorder, asthma, and other conditions noted elsewhere in medical record     PLAN SUMMARY & DISPOSITION: -Labs in 8 weeks - RTC after labs  All questions were answered. The patient knows to call the clinic with any problems, questions or concerns.  Medical decision making: Moderate  Time spent on visit: I spent 20 minutes  counseling the patient face to face. The total time spent in the appointment was 30 minutes and more than 50% was on counseling.   Harriett Rush, PA-C  07/21/2021 2:17 PM

## 2021-07-20 NOTE — Telephone Encounter (Signed)
Rx sent. Detailed message left on voicemail. Patient informed to call office back to confirm he received to message.

## 2021-07-21 ENCOUNTER — Other Ambulatory Visit: Payer: Self-pay

## 2021-07-21 ENCOUNTER — Inpatient Hospital Stay (HOSPITAL_BASED_OUTPATIENT_CLINIC_OR_DEPARTMENT_OTHER): Payer: Medicaid Other | Admitting: Physician Assistant

## 2021-07-21 ENCOUNTER — Inpatient Hospital Stay (HOSPITAL_COMMUNITY): Payer: Medicaid Other | Attending: Hematology

## 2021-07-21 VITALS — BP 107/61 | HR 63 | Temp 96.7°F | Resp 20 | Wt 201.7 lb

## 2021-07-21 DIAGNOSIS — D61818 Other pancytopenia: Secondary | ICD-10-CM | POA: Insufficient documentation

## 2021-07-21 DIAGNOSIS — N390 Urinary tract infection, site not specified: Secondary | ICD-10-CM | POA: Diagnosis not present

## 2021-07-21 DIAGNOSIS — Z86718 Personal history of other venous thrombosis and embolism: Secondary | ICD-10-CM | POA: Diagnosis not present

## 2021-07-21 DIAGNOSIS — I82409 Acute embolism and thrombosis of unspecified deep veins of unspecified lower extremity: Secondary | ICD-10-CM

## 2021-07-21 DIAGNOSIS — D539 Nutritional anemia, unspecified: Secondary | ICD-10-CM | POA: Diagnosis not present

## 2021-07-21 DIAGNOSIS — N3000 Acute cystitis without hematuria: Secondary | ICD-10-CM

## 2021-07-21 LAB — CBC WITH DIFFERENTIAL/PLATELET
Abs Immature Granulocytes: 0.01 10*3/uL (ref 0.00–0.07)
Basophils Absolute: 0 10*3/uL (ref 0.0–0.1)
Basophils Relative: 0 %
Eosinophils Absolute: 0.2 10*3/uL (ref 0.0–0.5)
Eosinophils Relative: 5 %
HCT: 39.3 % (ref 39.0–52.0)
Hemoglobin: 13.1 g/dL (ref 13.0–17.0)
Immature Granulocytes: 0 %
Lymphocytes Relative: 30 %
Lymphs Abs: 1 10*3/uL (ref 0.7–4.0)
MCH: 34 pg (ref 26.0–34.0)
MCHC: 33.3 g/dL (ref 30.0–36.0)
MCV: 102.1 fL — ABNORMAL HIGH (ref 80.0–100.0)
Monocytes Absolute: 0.4 10*3/uL (ref 0.1–1.0)
Monocytes Relative: 13 %
Neutro Abs: 1.7 10*3/uL (ref 1.7–7.7)
Neutrophils Relative %: 52 %
Platelets: 70 10*3/uL — ABNORMAL LOW (ref 150–400)
RBC: 3.85 MIL/uL — ABNORMAL LOW (ref 4.22–5.81)
RDW: 13.8 % (ref 11.5–15.5)
WBC: 3.3 10*3/uL — ABNORMAL LOW (ref 4.0–10.5)
nRBC: 0 % (ref 0.0–0.2)

## 2021-07-21 MED ORDER — CIPROFLOXACIN HCL 500 MG PO TABS: 500.0000 mg | ORAL_TABLET | Freq: Two times a day (BID) | ORAL | 0 refills | Status: DC

## 2021-07-21 NOTE — Patient Instructions (Signed)
[place] at [facility] Discharge Instructions  You were seen today by Tarri Abernethy PA-C for your abnormal blood counts.  Your blood counts have been looking much better lately.  You did have some low white blood cells and low platelets today, but this is most likely reaction to your current urinary tract infection and antibiotic use.  LABS: Return in 8 weeks for repeat labs  MEDICATIONS: Continue taking B12 supplement daily.  FOLLOW-UP APPOINTMENT: Office visit after labs   Thank you for choosing [place] at [facility] to provide your oncology and hematology care.  To afford each patient quality time with our provider, please arrive at least 15 minutes before your scheduled appointment time.   If you have a lab appointment with the [facility] please come in thru the Main Entrance and check in at the main information desk.  You need to re-schedule your appointment should you arrive 10 or more minutes late.  We strive to give you quality time with our providers, and arriving late affects you and other patients whose appointments are after yours.  Also, if you no show three or more times for appointments you may be dismissed from the clinic at the providers discretion.     Again, thank you for choosing [facility].  Our hope is that these requests will decrease the amount of time that you wait before being seen by our physicians.       _____________________________________________________________  Should you have questions after your visit to [facility], please contact our office at [phone] and follow the prompts.  Our office hours are 8:00 a.m. and 4:30 p.m. Monday - Friday.  Please note that voicemails left after 4:00 p.m. may not be returned until the following business day.  We are closed weekends and major holidays.  You do have access to a nurse 24-7, just call the main number to the clinic  [phone] and do not press any options, hold on the line and a nurse will answer the phone.    For prescription refill requests, have your pharmacy contact our office and allow 72 hours.    Due to Covid, you will need to wear a mask upon entering the hospital. If you do not have a mask, a mask will be given to you at the Main Entrance upon arrival. For doctor visits, patients may have 1 support person age 22 or older with them. For treatment visits, patients can not have anyone with them due to social distancing guidelines and our immunocompromised population.

## 2021-07-21 NOTE — Telephone Encounter (Signed)
Patient returned call to office to confirm he received the message and will go pick up new antibiotic.

## 2021-08-13 ENCOUNTER — Ambulatory Visit: Payer: Medicaid Other | Admitting: Internal Medicine

## 2021-09-14 ENCOUNTER — Ambulatory Visit (INDEPENDENT_AMBULATORY_CARE_PROVIDER_SITE_OTHER): Payer: Medicaid Other | Admitting: Urology

## 2021-09-14 ENCOUNTER — Other Ambulatory Visit: Payer: Self-pay

## 2021-09-14 ENCOUNTER — Encounter: Payer: Self-pay | Admitting: Urology

## 2021-09-14 VITALS — BP 103/66 | HR 70

## 2021-09-14 DIAGNOSIS — N3000 Acute cystitis without hematuria: Secondary | ICD-10-CM | POA: Diagnosis not present

## 2021-09-14 DIAGNOSIS — R300 Dysuria: Secondary | ICD-10-CM | POA: Diagnosis not present

## 2021-09-14 MED ORDER — NITROFURANTOIN MONOHYD MACRO 100 MG PO CAPS: 100.0000 mg | ORAL_CAPSULE | Freq: Two times a day (BID) | ORAL | 0 refills | Status: DC

## 2021-09-14 NOTE — Progress Notes (Signed)
09/14/2021 2:26 PM   [name] Mar 20, 1966 [id number]  Referring provider: [name], MD [address] [place],  [place] [address]  dysuria   HPI: [name] is a 55yo here with new onset dysuria. He was originally scheduled for cystoscopy for recurrent UTI. He has associated urinary frequency, urgency and nocturia 3-4x. Urine stream is fair. He has a feeling of incomplete emptying and suprapubic pain.    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[place])    right, s/p replacement, due to prednisone   Bipolar disorder ([place])    Colostomy in place [facility])    Crohn's colitis ([place])    s/p total colectomy with ileostomy in 2009   Crohn's disease of small intestine [facility]) Sept 2012   ileoscopy: multiple ulcers likely secondary to [place], HX OF 02/11/2009   Qualifier: Diagnosis of  By: Zeb Comfort     Depression    DVT (deep venous thrombosis) ([place]) 2009   right upper extremity due to PICC   Enterococcus UTI 2009   GERD (gastroesophageal reflux disease)    Hernia    Hyperlipidemia    Insomnia    Migraine headache    Nystagmus    PULMONARY EMBOLISM, HX OF 02/11/2009   Qualifier: Diagnosis of  By: Zeb Comfort     S/P endoscopy Sept 2012   mild gastritis, small hiatal hernia, no ulcers   Schizophrenia ([place])    Schizophrenia, acute ([place])     Surgical History: Past Surgical History:  Procedure Laterality Date   APPENDECTOMY     ESOPHAGOGASTRODUODENOSCOPY  07/2010   gastritis,  bx neg for H.Pylori   ESOPHAGOGASTRODUODENOSCOPY N/A 01/08/2014   SLF:NO SOURCE FOR ODYNOPHAGIA IDENTIFIED/Multiple small ulcers in the gastric antrum   EYE SURGERY     ILEOSCOPY  06/29/2011   SLF: ulcers,multiple/small HH/mild gastritis   KIDNEY SURGERY     LAPAROTOMY N/A 06/05/2013   Procedure: EXPLORATORY LAPAROTOMY;  Surgeon: Donato Heinz, MD;  Location: [facility];  Service: General;  Laterality:  N/A;   LYSIS OF ADHESION N/A 06/05/2013   Procedure: LYSIS OF ADHESIONS;  Surgeon: Donato Heinz, MD;  Location: [facility];  Service: General;  Laterality: N/A;   small bowel capsule study  08/2010   few tiny nonbleeding erosions/ulcers ?secondary to relafen or Crohn's. Entire small bowel not seen.    TOTAL COLECTOMY  2009   with ileostomy at [facility] for refractory disease    TOTAL HIP ARTHROPLASTY     right, due to avascular necrosis from chronic prednisone   TOTAL SHOULDER REPLACEMENT     bilateral   TRANSURETHRAL RESECTION OF PROSTATE N/A 12/03/2019   Procedure: TRANSURETHRAL RESECTION OF THE PROSTATE (TURP);  Surgeon: Cleon Gustin, MD;  Location: [facility];  Service: Urology;  Laterality: N/A;    Home Medications:  Allergies as of 09/14/2021       Reactions   Bactrim [sulfamethoxazole-trimethoprim] Other (See Comments)   ABD CRAMPS AND DIARRHEA   Oxycodone-acetaminophen Other (See Comments)   Other reaction(s): Swelling   Remicade [infliximab] Other (See Comments)   COULDN'T BREATHE   Codeine    REACTION: unknown reaction   Ibuprofen    REACTION: unknown reaction   Penicillins    REACTION: unknown reaction   Tramadol Hcl    REACTION: unknown reaction   Aspirin    REACTION: unknown reaction  Tramadol Nausea Only   Other reaction(s): Nausea   Vicodin [hydrocodone-acetaminophen] Nausea Only        Medication List        Accurate as of September 14, 2021  2:26 PM. If you have any questions, ask your nurse or doctor.          Accu-Chek Aviva Plus test strip Generic drug: glucose blood SMARTSIG:Via Meter   Active Life 1-Pc Drain 19-64mm Pouch Misc   albuterol (2.5 MG/3ML) 0.083% nebulizer solution Commonly known as: PROVENTIL Take 2.5 mg by nebulization every 6 (six) hours as needed for wheezing or shortness of breath.   albuterol 108 (90 Base) MCG/ACT inhaler Commonly known as: VENTOLIN HFA Inhale 2 puffs into the lungs every 6 (six) hours as needed for  wheezing or shortness of breath.   atorvastatin 80 MG tablet Commonly known as: LIPITOR Take 80 mg by mouth daily.   azelastine 0.1 % nasal spray Commonly known as: ASTELIN Place 2 sprays into both nostrils 2 (two) times daily.   B-12 1000 MCG Caps Take 1 capsule by mouth daily.   baclofen 10 MG tablet Commonly known as: LIORESAL Take 10 mg by mouth daily.   benztropine 0.5 MG tablet Commonly known as: COGENTIN Take 0.5 mg by mouth daily.   Calcium 600/Vitamin D3 600-20 MG-MCG Tabs Generic drug: Calcium Carb-Cholecalciferol TAKE (1) TABLET BY MOUTH THREE TIMES DAILY AFTER MEALS.   ciprofloxacin 500 MG tablet Commonly known as: CIPRO Take 1 tablet (500 mg total) by mouth 2 (two) times daily.   dicyclomine 10 MG capsule Commonly known as: BENTYL TAKE 1 CAPSULE BY MOUTH BEFORE MEALS AND AT BEDTIME AS NEEDED FOR ABDOMINAL CRAMPING. *HOLD FOR CONSTIPATION*   divalproex 500 MG 24 hr tablet Commonly known as: DEPAKOTE ER Take 1,000 mg by mouth every evening.   divalproex 250 MG DR tablet Commonly known as: DEPAKOTE Take by mouth at bedtime.   docusate sodium 100 MG capsule Commonly known as: COLACE Take 1 capsule (100 mg total) by mouth 2 (two) times daily.   doxycycline 100 MG capsule Commonly known as: VIBRAMYCIN Take 1 capsule (100 mg total) by mouth 2 (two) times daily.   Dulera 200-5 MCG/ACT Aero Generic drug: mometasone-formoterol Inhale 2 puffs into the lungs in the morning and at bedtime.   Eliquis 5 MG Tabs tablet Generic drug: apixaban Take 5 mg by mouth 2 (two) times daily.   Flovent HFA 110 MCG/ACT inhaler Generic drug: fluticasone Inhale 2 puffs into the lungs 2 (two) times daily.   furosemide 40 MG tablet Commonly known as: LASIX Take 40 mg by mouth daily.   gabapentin 300 MG capsule Commonly known as: NEURONTIN Take 900 mg by mouth 3 (three) times daily.   hydroxychloroquine 200 MG tablet Commonly known as: PLAQUENIL Take 1 tablet by  mouth 2 (two) times daily.   meloxicam 7.5 MG tablet Commonly known as: MOBIC Take 7.5 mg by mouth daily.   metoprolol succinate 50 MG 24 hr tablet Commonly known as: TOPROL-XL Take 1 tablet (50 mg total) by mouth daily. Take with or immediately following a meal.   nitrofurantoin (macrocrystal-monohydrate) 100 MG capsule Commonly known as: MACROBID Take 1 capsule (100 mg total) by mouth every 12 (twelve) hours. Started by: [name], MD   nitroGLYCERIN 0.4 MG SL tablet Commonly known as: NITROSTAT Place 1 tablet (0.4 mg total) under the tongue every 5 (five) minutes as needed for chest pain.   pantoprazole 40 MG tablet Commonly known as: PROTONIX  TAKE (1) TABLET BY MOUTH TWICE A DAY BEFORE MEALS. (BREAKFAST AND SUPPER) What changed: See the new instructions.   potassium chloride SA 20 MEQ tablet Commonly known as: KLOR-CON M Take 20 mEq by mouth daily.   risperiDONE 1 MG tablet Commonly known as: RISPERDAL Take 1 tablet by mouth at bedtime.   risperiDONE 3 MG tablet Commonly known as: RISPERDAL Take 3 mg by mouth 2 (two) times daily.   sertraline 50 MG tablet Commonly known as: ZOLOFT Take 50 mg by mouth daily.   Spiriva Respimat 2.5 MCG/ACT Aers Generic drug: Tiotropium Bromide Monohydrate Inhale 2 puffs into the lungs daily.   tamsulosin 0.4 MG Caps capsule Commonly known as: FLOMAX Take 0.4 mg by mouth daily.   traMADol 50 MG tablet Commonly known as: ULTRAM 1 tablet as needed   traZODone 100 MG tablet Commonly known as: DESYREL Take 100 mg by mouth at bedtime.   [employer] Misc USE AS DIRECTED        Allergies:  Allergies  Allergen Reactions   Bactrim [Sulfamethoxazole-Trimethoprim] Other (See Comments)    ABD CRAMPS AND DIARRHEA   Oxycodone-Acetaminophen Other (See Comments)    Other reaction(s): Swelling   Remicade [Infliximab] Other (See Comments)    COULDN'T BREATHE   Codeine     REACTION: unknown reaction   Ibuprofen      REACTION: unknown reaction   Penicillins     REACTION: unknown reaction   Tramadol Hcl     REACTION: unknown reaction   Aspirin     REACTION: unknown reaction   Tramadol Nausea Only    Other reaction(s): Nausea   Vicodin [Hydrocodone-Acetaminophen] Nausea Only    Family History: Family History  Problem Relation Age of Onset   Diabetes Mother    Heart disease Mother    Diabetes Father    Heart disease Father    COPD Maternal Grandmother    Colon cancer Neg Hx     Social History:  reports that he has been smoking cigarettes. He has a 148.00 pack-year smoking history. He has never used smokeless tobacco. He reports that he does not drink alcohol and does not use drugs.  ROS: All other review of systems were reviewed and are negative except what is noted above in HPI  Physical Exam: BP 103/66   Pulse 70   Constitutional:  Alert and oriented, No acute distress. HEENT: [place] AT, moist mucus membranes.  Trachea midline, no masses. Cardiovascular: No clubbing, cyanosis, or edema. Respiratory: Normal respiratory effort, no increased work of breathing. GI: Abdomen is soft, nontender, nondistended, no abdominal masses GU: No CVA tenderness.  Lymph: No cervical or inguinal lymphadenopathy. Skin: No rashes, bruises or suspicious lesions. Neurologic: Grossly intact, no focal deficits, moving all 4 extremities. Psychiatric: Normal mood and affect.  Laboratory Data: Lab Results  Component Value Date   WBC 3.3 (L) 07/21/2021   HGB 13.1 07/21/2021   HCT 39.3 07/21/2021   MCV 102.1 (H) 07/21/2021   PLT 70 (L) 07/21/2021    Lab Results  Component Value Date   CREATININE 0.61 05/18/2021    No results found for: PSA  No results found for: TESTOSTERONE  Lab Results  Component Value Date   HGBA1C 4.8 05/17/2021    Urinalysis    Component Value Date/Time   COLORURINE YELLOW 05/16/2021 1411   APPEARANCEUR Cloudy (A) 07/15/2021 1421   LABSPEC 1.016 05/16/2021 1411   PHURINE  6.0 05/16/2021 1411   GLUCOSEU Negative 07/15/2021 1421   HGBUR  SMALL (A) 05/16/2021 1411   BILIRUBINUR Negative 07/15/2021 1421   KETONESUR NEGATIVE 05/16/2021 1411   PROTEINUR Negative 07/15/2021 1421   PROTEINUR NEGATIVE 05/16/2021 1411   UROBILINOGEN 0.2 10/17/2014 1940   NITRITE Positive (A) 07/15/2021 1421   NITRITE POSITIVE (A) 05/16/2021 1411   LEUKOCYTESUR 3+ (A) 07/15/2021 1421   LEUKOCYTESUR TRACE (A) 05/16/2021 1411    Lab Results  Component Value Date   LABMICR See below: 07/15/2021   WBCUA >30 (A) 07/15/2021   LABEPIT 0-10 07/15/2021   BACTERIA Many (A) 07/15/2021    Pertinent Imaging:  No results found for this or any previous visit.  No results found for this or any previous visit.  No results found for this or any previous visit.  No results found for this or any previous visit.  Results for orders placed during the hospital encounter of 04/27/16  US Renal  Narrative CLINICAL DATA:  Bladder neck alpha obstruction. Recurrent UTIs. History of Crohn's disease. Colostomy. EXAM: RENAL / URINARY TRACT ULTRASOUND COMPLETE COMPARISON:  CT 03/05/2014 . FINDINGS: Right Kidney: Length: 12.6 cm. Echogenicity within normal limits. No mass or hydronephrosis visualized. Left Kidney: Length: 11.9 cm. Echogenicity within normal limits. No mass or hydronephrosis visualized. Bladder: Appears normal for degree of bladder distention. Bilateral ureteral jets noted. IMPRESSION: No acute or focal abnormality. Electronically Signed By: Marcello Moores  Register On: 04/27/2016 13:37  No results found for this or any previous visit.  No results found for this or any previous visit.  No results found for this or any previous visit.   Assessment & Plan:    1. Dysuria -Urine for culture -macrobid 100gm BID for 7 days - Urinalysis, Routine w reflex microscopic   No follow-ups on file.  [name], MD  [facility] [place]

## 2021-09-14 NOTE — Progress Notes (Signed)
post void residual=0   Urological Symptom Review  Patient is experiencing the following symptoms: Frequent urination Burning/pain with urination Get up at night to urinate Leakage of urine Stream starts and stops Urinary tract infection   Review of Systems  Gastrointestinal (upper)  : Negative for upper GI symptoms  Gastrointestinal (lower) : Negative for lower GI symptoms  Constitutional : Negative for symptoms  Skin: Negative for skin symptoms  Eyes: Negative for eye symptoms  Ear/Nose/Throat : Negative for Ear/Nose/Throat symptoms  Hematologic/Lymphatic: Negative for Hematologic/Lymphatic symptoms  Cardiovascular : Negative for cardiovascular symptoms  Respiratory : Negative for respiratory symptoms  Endocrine: Negative for endocrine symptoms  Musculoskeletal: Negative for musculoskeletal symptoms  Neurological: Negative for neurological symptoms  Psychologic: Negative for psychiatric symptoms

## 2021-09-14 NOTE — Patient Instructions (Signed)
Urinary Tract Infection, Adult A urinary tract infection (UTI) is an infection of any part of the urinary tract. The urinary tract includes the kidneys, ureters, bladder, and urethra. These organs make, store, and get rid of urine in the body. An upper UTI affects the ureters and kidneys. A lower UTI affects the bladder and urethra. What are the causes? Most urinary tract infections are caused by bacteria in your genital area around your urethra, where urine leaves your body. These bacteria grow and cause inflammation of your urinary tract. What increases the risk? You are more likely to develop this condition if: You have a urinary catheter that stays in place. You are not able to control when you urinate or have a bowel movement (incontinence). You are male and you: Use a spermicide or diaphragm for birth control. Have low estrogen levels. Are pregnant. You have certain genes that increase your risk. You are sexually active. You take antibiotic medicines. You have a condition that causes your flow of urine to slow down, such as: An enlarged prostate, if you are male. Blockage in your urethra. A kidney stone. A nerve condition that affects your bladder control (neurogenic bladder). Not getting enough to drink, or not urinating often. You have certain medical conditions, such as: Diabetes. A weak disease-fighting system (immunesystem). Sickle cell disease. Gout. Spinal cord injury. What are the signs or symptoms? Symptoms of this condition include: Needing to urinate right away (urgency). Frequent urination. This may include small amounts of urine each time you urinate. Pain or burning with urination. Blood in the urine. Urine that smells bad or unusual. Trouble urinating. Cloudy urine. Vaginal discharge, if you are male. Pain in the abdomen or the lower back. You may also have: Vomiting or a decreased appetite. Confusion. Irritability or tiredness. A fever or  chills. Diarrhea. The first symptom in older adults may be confusion. In some cases, they may not have any symptoms until the infection has worsened. How is this diagnosed? This condition is diagnosed based on your medical history and a physical exam. You may also have other tests, including: Urine tests. Blood tests. Tests for STIs (sexually transmitted infections). If you have had more than one UTI, a cystoscopy or imaging studies may be done to determine the cause of the infections. How is this treated? Treatment for this condition includes: Antibiotic medicine. Over-the-counter medicines to treat discomfort. Drinking enough water to stay hydrated. If you have frequent infections or have other conditions such as a kidney stone, you may need to see a health care provider who specializes in the urinary tract (urologist). In rare cases, urinary tract infections can cause sepsis. Sepsis is a life-threatening condition that occurs when the body responds to an infection. Sepsis is treated in the hospital with IV antibiotics, fluids, and other medicines. Follow these instructions at home: Medicines Take over-the-counter and prescription medicines only as told by your health care provider. If you were prescribed an antibiotic medicine, take it as told by your health care provider. Do not stop using the antibiotic even if you start to feel better. General instructions Make sure you: Empty your bladder often and completely. Do not hold urine for long periods of time. Empty your bladder after sex. Wipe from front to back after urinating or having a bowel movement if you are male. Use each tissue only one time when you wipe. Drink enough fluid to keep your urine pale yellow. Keep all follow-up visits. This is important. Contact a health care provider  if: Your symptoms do not get better after 1-2 days. Your symptoms go away and then return. Get help right away if: You have severe pain in your  back or your lower abdomen. You have a fever or chills. You have nausea or vomiting. Summary A urinary tract infection (UTI) is an infection of any part of the urinary tract, which includes the kidneys, ureters, bladder, and urethra. Most urinary tract infections are caused by bacteria in your genital area. Treatment for this condition often includes antibiotic medicines. If you were prescribed an antibiotic medicine, take it as told by your health care provider. Do not stop using the antibiotic even if you start to feel better. Keep all follow-up visits. This is important. This information is not intended to replace advice given to you by your health care provider. Make sure you discuss any questions you have with your health care provider. Document Revised: 05/02/2020 Document Reviewed: 05/02/2020 Elsevier Patient Education  [place].

## 2021-09-15 ENCOUNTER — Inpatient Hospital Stay (HOSPITAL_COMMUNITY): Payer: Medicaid Other

## 2021-09-15 ENCOUNTER — Inpatient Hospital Stay (HOSPITAL_COMMUNITY): Payer: Medicaid Other | Attending: Hematology

## 2021-09-15 DIAGNOSIS — D61818 Other pancytopenia: Secondary | ICD-10-CM | POA: Diagnosis present

## 2021-09-15 LAB — URINALYSIS, ROUTINE W REFLEX MICROSCOPIC
Bilirubin, UA: NEGATIVE
Glucose, UA: NEGATIVE
Ketones, UA: NEGATIVE
Nitrite, UA: POSITIVE — AB
Protein,UA: NEGATIVE
RBC, UA: NEGATIVE
Specific Gravity, UA: 1.015 (ref 1.005–1.030)
Urobilinogen, Ur: 0.2 mg/dL (ref 0.2–1.0)
pH, UA: 6 (ref 5.0–7.5)

## 2021-09-15 LAB — CBC WITH DIFFERENTIAL/PLATELET
Abs Immature Granulocytes: 0.01 10*3/uL (ref 0.00–0.07)
Basophils Absolute: 0 10*3/uL (ref 0.0–0.1)
Basophils Relative: 0 %
Eosinophils Absolute: 0.1 10*3/uL (ref 0.0–0.5)
Eosinophils Relative: 2 %
HCT: 40.9 % (ref 39.0–52.0)
Hemoglobin: 13.9 g/dL (ref 13.0–17.0)
Immature Granulocytes: 0 %
Lymphocytes Relative: 24 %
Lymphs Abs: 1 10*3/uL (ref 0.7–4.0)
MCH: 33.3 pg (ref 26.0–34.0)
MCHC: 34 g/dL (ref 30.0–36.0)
MCV: 98.1 fL (ref 80.0–100.0)
Monocytes Absolute: 0.5 10*3/uL (ref 0.1–1.0)
Monocytes Relative: 13 %
Neutro Abs: 2.5 10*3/uL (ref 1.7–7.7)
Neutrophils Relative %: 61 %
Platelets: 88 10*3/uL — ABNORMAL LOW (ref 150–400)
RBC: 4.17 MIL/uL — ABNORMAL LOW (ref 4.22–5.81)
RDW: 14.7 % (ref 11.5–15.5)
WBC: 4.1 10*3/uL (ref 4.0–10.5)
nRBC: 0 % (ref 0.0–0.2)

## 2021-09-15 LAB — COMPREHENSIVE METABOLIC PANEL
ALT: 25 U/L (ref 0–44)
AST: 31 U/L (ref 15–41)
Albumin: 3.5 g/dL (ref 3.5–5.0)
Alkaline Phosphatase: 91 U/L (ref 38–126)
Anion gap: 7 (ref 5–15)
BUN: 5 mg/dL — ABNORMAL LOW (ref 6–20)
CO2: 29 mmol/L (ref 22–32)
Calcium: 9.2 mg/dL (ref 8.9–10.3)
Chloride: 102 mmol/L (ref 98–111)
Creatinine, Ser: 0.85 mg/dL (ref 0.61–1.24)
GFR, Estimated: >60 mL/min (ref >60)
Glucose, Bld: 97 mg/dL (ref 70–99)
Potassium: 4.5 mmol/L (ref 3.5–5.1)
Sodium: 138 mmol/L (ref 135–145)
Total Bilirubin: 0.8 mg/dL (ref 0.3–1.2)
Total Protein: 6.7 g/dL (ref 6.5–8.1)

## 2021-09-15 LAB — IRON AND TIBC
Iron: 52 ug/dL (ref 45–182)
Saturation Ratios: 14 % — ABNORMAL LOW (ref 17.9–39.5)
TIBC: 374 ug/dL (ref 250–450)
UIBC: 322 ug/dL

## 2021-09-15 LAB — MICROSCOPIC EXAMINATION
RBC, Urine: NONE SEEN /hpf (ref 0–2)
Renal Epithel, UA: NONE SEEN /hpf

## 2021-09-15 LAB — VITAMIN B12: Vitamin B-12: 927 pg/mL — ABNORMAL HIGH (ref 180–914)

## 2021-09-15 LAB — FOLATE: Folate: 12.7 ng/mL (ref >5.9)

## 2021-09-15 LAB — LACTATE DEHYDROGENASE: LDH: 159 U/L (ref 98–192)

## 2021-09-15 LAB — FERRITIN: Ferritin: 45 ng/mL (ref 24–336)

## 2021-09-17 LAB — METHYLMALONIC ACID, SERUM: Methylmalonic Acid, Quantitative: 86 nmol/L (ref 0–378)

## 2021-09-21 ENCOUNTER — Ambulatory Visit (HOSPITAL_COMMUNITY): Payer: Medicaid Other | Admitting: Physician Assistant

## 2021-09-23 ENCOUNTER — Other Ambulatory Visit: Payer: Medicaid Other | Admitting: Urology

## 2021-09-25 ENCOUNTER — Other Ambulatory Visit: Payer: Medicaid Other | Admitting: Urology

## 2021-10-06 ENCOUNTER — Telehealth: Payer: Self-pay

## 2021-10-06 NOTE — Progress Notes (Signed)
[place], [place] [address]   CLINIC:  Medical Oncology/Hematology  PCP:  [name], MD [address] [place] [address] [phone]   REASON FOR VISIT:  Follow-up for pancytopenia, recurrent DVTs   CURRENT THERAPY: Eliquis  INTERVAL HISTORY:  [name] 56 y.o. male returns for routine follow-up of pancytopenia and recurrent DVTs.  He was last seen by Tarri Abernethy PA-C on 07/21/2021.    At today's visit, he reports feeling fairly well.  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  He continues to take Eliquis and reports easy bruising. He denies any abnormal blood loss such as bright red blood or melena, no blood in ostomy. He reports that his left lower extremity edema and pain has improved since his last visit, but still comes and goes.  He denies any chest pain, dyspnea on exertion, or hemoptysis.  No unexplained fever, chills, night sweats, fatigue.  He reports that his appetite has improved and his weight has been more stable.  No significant weight loss since his last visit.   He continues to remain off of methotrexate and 6-mercaptopurine.  He has been referred to specialized IBD clinic for management of his Crohn's disease, but has not yet been started on any new medications.  He is being seen by urology due to frequent UTIs.  He is currently symptomatic with dysuria.  His most recent antibiotics were with Macrobid x1 week, started on 09/14/2021.  Of note, his most recent labs were drawn on 09/15/2021.  He has 100% energy and 100% appetite. He endorses that he is maintaining a stable weight.   REVIEW OF SYSTEMS:  Review of Systems  Constitutional:  Negative for appetite change, chills, diaphoresis, fatigue, fever and unexpected weight change.  HENT:   Negative for lump/mass and nosebleeds.   Eyes:  Negative for eye  problems.  Respiratory:  Negative for cough, hemoptysis and shortness of breath.   Cardiovascular:  Negative for chest pain, leg swelling and palpitations.  Gastrointestinal:  Negative for abdominal pain, blood in stool, constipation, diarrhea, nausea and vomiting.  Genitourinary:  Positive for dysuria. Negative for hematuria.   Musculoskeletal:  Positive for arthralgias.  Skin: Negative.   Neurological:  Negative for dizziness, headaches and light-headedness.  Hematological:  Does not bruise/bleed easily.     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[place])    right, s/p replacement, due to prednisone   Bipolar disorder ([place])    Colostomy in place [facility])    Crohn's colitis ([place])    s/p total colectomy with ileostomy in 2009   Crohn's disease of small intestine [place]) Sept 2012   ileoscopy: multiple ulcers likely secondary to [place], HX OF 02/11/2009   Qualifier: Diagnosis of  By: Zeb Comfort     Depression    DVT (deep venous thrombosis) ([place]) 2009   right upper extremity due to PICC   Enterococcus UTI 2009   GERD (gastroesophageal reflux disease)    Hernia    Hyperlipidemia    Insomnia    Migraine headache    Nystagmus    PULMONARY EMBOLISM, HX OF 02/11/2009   Qualifier: Diagnosis of  By: Zeb Comfort     S/P endoscopy  Sept 2012   mild gastritis, small hiatal hernia, no ulcers   Schizophrenia ([place])    Schizophrenia, acute ([place])    Past Surgical History:  Procedure Laterality Date   APPENDECTOMY     ESOPHAGOGASTRODUODENOSCOPY  07/2010   gastritis,  bx neg for H.Pylori   ESOPHAGOGASTRODUODENOSCOPY N/A 01/08/2014   SLF:NO SOURCE FOR ODYNOPHAGIA IDENTIFIED/Multiple small ulcers in the gastric antrum   EYE SURGERY     ILEOSCOPY  06/29/2011   SLF: ulcers,multiple/small HH/mild gastritis   KIDNEY SURGERY     LAPAROTOMY N/A 06/05/2013   Procedure:  EXPLORATORY LAPAROTOMY;  Surgeon: Donato Heinz, MD;  Location: [facility];  Service: General;  Laterality: N/A;   LYSIS OF ADHESION N/A 06/05/2013   Procedure: LYSIS OF ADHESIONS;  Surgeon: Donato Heinz, MD;  Location: [facility];  Service: General;  Laterality: N/A;   small bowel capsule study  08/2010   few tiny nonbleeding erosions/ulcers ?secondary to relafen or Crohn's. Entire small bowel not seen.    TOTAL COLECTOMY  2009   with ileostomy at [facility] for refractory disease    TOTAL HIP ARTHROPLASTY     right, due to avascular necrosis from chronic prednisone   TOTAL SHOULDER REPLACEMENT     bilateral   TRANSURETHRAL RESECTION OF PROSTATE N/A 12/03/2019   Procedure: TRANSURETHRAL RESECTION OF THE PROSTATE (TURP);  Surgeon: Cleon Gustin, MD;  Location: [facility];  Service: Urology;  Laterality: N/A;     SOCIAL HISTORY:  Social History   Socioeconomic History   Marital status: Single    Spouse name: Not on file   Number of children: Not on file   Years of education: Not on file   Highest education level: Not on file  Occupational History   Not on file  Tobacco Use   Smoking status: Every Day    Packs/day: 4.00    Years: 37.00    Pack years: 148.00    Types: Cigarettes   Smokeless tobacco: Never   Tobacco comments:    down to 4ppd 11/14/20  Vaping Use   Vaping Use: Never used  Substance and Sexual Activity   Alcohol use: No   Drug use: No   Sexual activity: Never  Other Topics Concern   Not on file  Social History Narrative   Girlfriend of six years, Rectal Cancer on Hospice (05/2011).   Social Determinants of Health   Financial Resource Strain: Not on file  Food Insecurity: Not on file  Transportation Needs: No Transportation Needs   Lack of Transportation (Medical): No   Lack of Transportation (Non-Medical): No  Physical Activity: Not on file  Stress: Not on file  Social Connections: Not on file  Intimate Partner Violence: Not At Risk   Fear of Current or  Ex-Partner: No   Emotionally Abused: No   Physically Abused: No   Sexually Abused: No    FAMILY HISTORY:  Family History  Problem Relation Age of Onset   Diabetes Mother    Heart disease Mother    Diabetes Father    Heart disease Father    COPD Maternal Grandmother    Colon cancer Neg Hx     CURRENT MEDICATIONS:  Outpatient Encounter Medications as of 10/07/2021  Medication Sig   ACCU-CHEK AVIVA PLUS test strip SMARTSIG:Via Meter   albuterol (PROVENTIL) (2.5 MG/3ML) 0.083% nebulizer solution Take 2.5 mg by nebulization every 6 (six) hours as needed for wheezing or shortness of breath. (Patient not taking: Reported on 07/21/2021)   albuterol (  VENTOLIN HFA) 108 (90 Base) MCG/ACT inhaler Inhale 2 puffs into the lungs every 6 (six) hours as needed for wheezing or shortness of breath. (Patient not taking: Reported on 07/21/2021)   atorvastatin (LIPITOR) 80 MG tablet Take 80 mg by mouth daily.   azelastine (ASTELIN) 0.1 % nasal spray Place 2 sprays into both nostrils 2 (two) times daily.   baclofen (LIORESAL) 10 MG tablet Take 10 mg by mouth daily.   benztropine (COGENTIN) 0.5 MG tablet Take 0.5 mg by mouth daily.    CALCIUM 600/VITAMIN D3 600-800 MG-UNIT TABS TAKE (1) TABLET BY MOUTH THREE TIMES DAILY AFTER MEALS.   ciprofloxacin (CIPRO) 500 MG tablet Take 1 tablet (500 mg total) by mouth 2 (two) times daily.   Cyanocobalamin (B-12) 1000 MCG CAPS Take 1 capsule by mouth daily.   dicyclomine (BENTYL) 10 MG capsule TAKE 1 CAPSULE BY MOUTH BEFORE MEALS AND AT BEDTIME AS NEEDED FOR ABDOMINAL CRAMPING. *HOLD FOR CONSTIPATION*   divalproex (DEPAKOTE) 250 MG DR tablet Take by mouth at bedtime.   divalproex (DEPAKOTE) 500 MG 24 hr tablet Take 1,000 mg by mouth every evening.   docusate sodium (COLACE) 100 MG capsule Take 1 capsule (100 mg total) by mouth 2 (two) times daily.   doxycycline (VIBRAMYCIN) 100 MG capsule Take 1 capsule (100 mg total) by mouth 2 (two) times daily.   ELIQUIS 5 MG TABS  tablet Take 5 mg by mouth 2 (two) times daily.   FLOVENT HFA 110 MCG/ACT inhaler Inhale 2 puffs into the lungs 2 (two) times daily.   furosemide (LASIX) 40 MG tablet Take 40 mg by mouth daily.   gabapentin (NEURONTIN) 300 MG capsule Take 900 mg by mouth 3 (three) times daily.   hydroxychloroquine (PLAQUENIL) 200 MG tablet Take 1 tablet by mouth 2 (two) times daily.   meloxicam (MOBIC) 7.5 MG tablet Take 7.5 mg by mouth daily.   metoprolol succinate (TOPROL-XL) 50 MG 24 hr tablet Take 1 tablet (50 mg total) by mouth daily. Take with or immediately following a meal.   Misc. Devices (WALKER WHEELS) MISC USE AS DIRECTED   mometasone-formoterol (DULERA) 200-5 MCG/ACT AERO Inhale 2 puffs into the lungs in the morning and at bedtime.   nitrofurantoin, macrocrystal-monohydrate, (MACROBID) 100 MG capsule Take 1 capsule (100 mg total) by mouth every 12 (twelve) hours.   nitroGLYCERIN (NITROSTAT) 0.4 MG SL tablet Place 1 tablet (0.4 mg total) under the tongue every 5 (five) minutes as needed for chest pain.   Ostomy Supplies (ACTIVE LIFE 1-PC DRAIN [phone]) Pouch MISC    pantoprazole (PROTONIX) 40 MG tablet TAKE (1) TABLET BY MOUTH TWICE A DAY BEFORE MEALS. (BREAKFAST AND SUPPER) (Patient taking differently: Take 40 mg by mouth 2 (two) times daily before a meal.)   potassium chloride SA (K-DUR,KLOR-CON) 20 MEQ tablet Take 20 mEq by mouth daily.    risperiDONE (RISPERDAL) 1 MG tablet Take 1 tablet by mouth at bedtime.   risperiDONE (RISPERDAL) 3 MG tablet Take 3 mg by mouth 2 (two) times daily.   sertraline (ZOLOFT) 50 MG tablet Take 50 mg by mouth daily.   tamsulosin (FLOMAX) 0.4 MG CAPS capsule Take 0.4 mg by mouth daily.   Tiotropium Bromide Monohydrate (SPIRIVA RESPIMAT) 2.5 MCG/ACT AERS Inhale 2 puffs into the lungs daily.   traMADol (ULTRAM) 50 MG tablet 1 tablet as needed   traZODone (DESYREL) 100 MG tablet Take 100 mg by mouth at bedtime.   Facility-Administered Encounter Medications as of 10/07/2021   Medication  ciprofloxacin (CIPRO) tablet 500 mg    ALLERGIES:  Allergies  Allergen Reactions   Bactrim [Sulfamethoxazole-Trimethoprim] Other (See Comments)    ABD CRAMPS AND DIARRHEA   Oxycodone-Acetaminophen Other (See Comments)    Other reaction(s): Swelling   Remicade [Infliximab] Other (See Comments)    COULDN'T BREATHE   Codeine     REACTION: unknown reaction   Ibuprofen     REACTION: unknown reaction   Penicillins     REACTION: unknown reaction   Tramadol Hcl     REACTION: unknown reaction   Aspirin     REACTION: unknown reaction   Tramadol Nausea Only    Other reaction(s): Nausea   Vicodin [Hydrocodone-Acetaminophen] Nausea Only     PHYSICAL EXAM:  ECOG PERFORMANCE STATUS: 1 - Symptomatic but completely ambulatory  There were no vitals filed for this visit. There were no vitals filed for this visit. Physical Exam Constitutional:      Appearance: Normal appearance. He is obese.  HENT:     Head: Normocephalic and atraumatic.     Mouth/Throat:     Mouth: Mucous membranes are moist.  Eyes:     Extraocular Movements: Extraocular movements intact.     Right eye: Nystagmus present.     Left eye: Nystagmus present.     Pupils: Pupils are equal, round, and reactive to light.  Cardiovascular:     Rate and Rhythm: Normal rate and regular rhythm.     Pulses: Normal pulses.     Heart sounds: Normal heart sounds.  Pulmonary:     Effort: Pulmonary effort is normal.     Breath sounds: Wheezing (COPD) present.  Abdominal:     General: Bowel sounds are normal.     Palpations: Abdomen is soft.     Tenderness: There is no abdominal tenderness.  Musculoskeletal:        General: No swelling.     Right lower leg: No edema.     Left lower leg: No edema.  Lymphadenopathy:     Cervical: No cervical adenopathy.  Skin:    General: Skin is warm and dry.  Neurological:     General: No focal deficit present.     Mental Status: He is alert and oriented to person, place,  and time.  Psychiatric:        Mood and Affect: Mood normal.        Behavior: Behavior normal.     LABORATORY DATA:  I have reviewed the labs as listed.  CBC    Component Value Date/Time   WBC 4.1 09/15/2021 0937   RBC 4.17 (L) 09/15/2021 0937   HGB 13.9 09/15/2021 0937   HCT 40.9 09/15/2021 0937   HCT 42 05/24/2011 1417   PLT 88 (L) 09/15/2021 0937   MCV 98.1 09/15/2021 0937   MCH 33.3 09/15/2021 0937   MCHC 34.0 09/15/2021 0937   RDW 14.7 09/15/2021 0937   LYMPHSABS 1.0 09/15/2021 0937   MONOABS 0.5 09/15/2021 0937   EOSABS 0.1 09/15/2021 0937   BASOSABS 0.0 09/15/2021 0937   CMP Latest Ref Rng & Units 09/15/2021 05/18/2021 05/17/2021  Glucose 70 - 99 mg/dL 97 84 92  BUN 6 - 20 mg/dL 5(L) 13 14  Creatinine 0.61 - 1.24 mg/dL 0.85 0.61 0.68  Sodium 135 - 145 mmol/L 138 140 134(L)  Potassium 3.5 - 5.1 mmol/L 4.5 3.9 3.8  Chloride 98 - 111 mmol/L 102 110 106  CO2 22 - 32 mmol/L 29 23 21(L)  Calcium 8.9 - 10.3  mg/dL 9.2 7.8(L) 7.7(L)  Total Protein 6.5 - 8.1 g/dL 6.7 4.9(L) -  Total Bilirubin 0.3 - 1.2 mg/dL 0.8 1.0 -  Alkaline Phos 38 - 126 U/L 91 55 -  AST 15 - 41 U/L 31 29 -  ALT 0 - 44 U/L 25 30 -    DIAGNOSTIC IMAGING:  I have independently reviewed the relevant imaging and discussed with the patient.  ASSESSMENT & PLAN: 1.  Pancytopenia (resolved) with persistent thrombocytopenia - Labs obtained at external lab [facility]) on 04/07/2021 showed new pancytopenia with WBC 2.3 (ANC 1.4, lymphocytes 0.6), platelets 113, Hgb 11.4 with MCV 111.0 - Nutritional panel (04/22/2021) was unremarkable: Normal methylmalonic acid/B12, copper, iron, folate - MGUS panel (04/22/2021): Immunofixation and SPEP were negative for monoclonal gammopathy, light chains unremarkable (mildly elevated kappa 30.3, but normal lambda and normal ratio); normal LDH - Pathology smear review (04/22/2021): Macrocytic anemia with polychromasia, occasional teardrop and RBC fragments, leukopenia with  neutropenia - Patient presented to ED on 05/06/2021 and was noted to have worsening pancytopenia, therefore mercaptopurine and methotrexate were discontinued (ED provider discussed with on-call GI as well, who agreed) - After mercaptopurine and methotrexate were discontinued, patient was noted to have improved blood counts with labs (05/18/2021) showing WBC 4.3 and platelets 143 - Most recent labs (09/15/2021): WBC 4.1, Hgb 13.9, platelets 88.  Vitamin B12 was 927, normal methylmalonic acid, normal folate.  CMP unremarkable.  LDH normal. - He continues to have frequent UTIs, most recent antibiotics were Macrobid x1 week on 09/14/2021 (of note, labs were drawn on 09/15/2021) - No B symptoms - Denies any major bleeding such as hematemesis, hematochezia, or melena - Suspect that persistent thrombocytopenia is reactive in the setting of UTI and antibiotic use.  Differential diagnosis also includes chronic ITP. - PLAN: Anemia and leukopenia have resolved.  Persistent thrombocytopenia most likely due to acute UTI/antibiotics, but may also be a sign of possible ITP. - We will repeat labs in 3 months with follow-up visit after labs. - Continue daily B12 (cyanocobalamin) - If no significant improvement in blood counts after acute causes of pancytopenia have resolved, would consider bone marrow biopsy in the future.   2.  Iron deficiency without anemia - Labs (09/15/2021): Hgb 13.9, ferritin 45, iron saturation 14% - Denies any major bleeding such as hematemesis, hematochezia, or melena - No fatigue, pica, or RLS - He reports that he is taking iron tablet in the past without any issues - PLAN: Recommend starting oral ferrous sulfate once daily.   3.  Recurrent DVTs with history of PE - Initial VTE events in 2009, around the same time the patient had colostomy placed - History of pulmonary embolism with CT angio on 01/31/2008 which was positive for PE and bilateral lower lobe pulmonary infarcts, likely source  suspected to be left subclavian Port-A-Cath which appeared to have contiguous embolus extending from the entry site into the left subclavian vein into the SVC - Venous ultrasound imaging (01/31/2008): Extensive deep vein thrombosis of left upper extremity extending into the jugular vein - Hypercoagulable panel was performed in 2009, which showed normal protein S, was negative for factor V Leiden gene mutation - Left lower extremity venous duplex (06/05/2020): Probable superficial thrombophlebitis in the left superficial greater saphenous vein - Patient reported that symptoms had worsened, and a repeat venous duplex performed in [place] on 06/19/2020 showed positive DVT involving common left femoral, profundofemoral, and superficial femoral veins - he was started on Eliquis at this time -  Most recent venous duplex of left lower extremity (12/02/2020): Nonocclusive thrombus within the common femoral, profundofemoral, and superficial femoral veins on the left, felt to represent chronic recanalized thrombus - Patient has family history of unprovoked VTE in several family members on his mother side - Further hypercoagulable work-up has been deferred, as it would not change the patient's need for lifelong anticoagulation at this point - Patient currently on Eliquis 5 mg twice daily (started after unprovoked DVT in September 2021), has previously been on warfarin (for treatment of upper extremity DVT/PE in 2009) - Most recent D-dimer (04/15/2021): 0.31 - PLAN: Continue Eliquis (indefinite anticoagulation as long as benefits outweigh risks)   4.  Crohn's disease & rheumatoid arthritis - Per external notes, disease was previously well controlled on methotrexate and mercaptopurine, but these unfortunately had to be discontinued due to worsening pancytopenia - Started on Humira instead.  Unfortunately, he developed severe UTI while on Humira, so it was discontinued. - He has been referred to an IBD clinic for  specialized management of his Crohn's disease, but has not yet been started on any new medications  5.  Unintentional weight loss, RESOLVED - Patient reports poor appetite and about a 10 pound unintentional weight loss since November 2021 - Referral sent to dietitian - Weight today is stable (201 pounds)   - PLAN: We will continue to monitor at follow-up visit.  If continued significant weight loss, would consider imaging studies if indicated.   6.  Other history - Patient has extensive other conditions including Crohn's disease, bipolar affective disorder, asthma, and other conditions noted elsewhere in medical record    PLAN SUMMARY & DISPOSITION: Start taking oral ferrous sulfate Labs and RTC in 3 months  All questions were answered. The patient knows to call the clinic with any problems, questions or concerns.  Medical decision making: Moderate  Time spent on visit: I spent 20 minutes counseling the patient face to face. The total time spent in the appointment was 30 minutes and more than 50% was on counseling.   Harriett Rush, PA-C  10/07/2021 9:14 AM

## 2021-10-06 NOTE — Telephone Encounter (Signed)
Patient called and advised he was displaying symptoms of a urinary tract infection. He advised he experience a burning sensation while urinating, frequent urge to urinate. Patient was advised to bring in urine sample 10/07/2020 due to next appt being 11/2021

## 2021-10-07 ENCOUNTER — Other Ambulatory Visit: Payer: Self-pay

## 2021-10-07 ENCOUNTER — Inpatient Hospital Stay (HOSPITAL_COMMUNITY): Payer: Medicaid Other | Attending: Hematology | Admitting: Physician Assistant

## 2021-10-07 VITALS — BP 97/55 | HR 80 | Temp 96.9°F | Resp 19 | Wt 208.5 lb

## 2021-10-07 DIAGNOSIS — K5090 Crohn's disease, unspecified, without complications: Secondary | ICD-10-CM | POA: Diagnosis not present

## 2021-10-07 DIAGNOSIS — D696 Thrombocytopenia, unspecified: Secondary | ICD-10-CM

## 2021-10-07 DIAGNOSIS — F17210 Nicotine dependence, cigarettes, uncomplicated: Secondary | ICD-10-CM | POA: Diagnosis not present

## 2021-10-07 DIAGNOSIS — I82C12 Acute embolism and thrombosis of left internal jugular vein: Secondary | ICD-10-CM | POA: Diagnosis not present

## 2021-10-07 DIAGNOSIS — D61818 Other pancytopenia: Secondary | ICD-10-CM | POA: Diagnosis not present

## 2021-10-07 DIAGNOSIS — I82409 Acute embolism and thrombosis of unspecified deep veins of unspecified lower extremity: Secondary | ICD-10-CM | POA: Diagnosis not present

## 2021-10-07 DIAGNOSIS — Z87440 Personal history of urinary (tract) infections: Secondary | ICD-10-CM | POA: Insufficient documentation

## 2021-10-07 DIAGNOSIS — M1990 Unspecified osteoarthritis, unspecified site: Secondary | ICD-10-CM | POA: Insufficient documentation

## 2021-10-07 DIAGNOSIS — E538 Deficiency of other specified B group vitamins: Secondary | ICD-10-CM | POA: Diagnosis not present

## 2021-10-07 DIAGNOSIS — Z86711 Personal history of pulmonary embolism: Secondary | ICD-10-CM | POA: Insufficient documentation

## 2021-10-07 DIAGNOSIS — E611 Iron deficiency: Secondary | ICD-10-CM | POA: Diagnosis not present

## 2021-10-07 MED ORDER — FERROUS SULFATE 325 (65 FE) MG PO TBEC: 325.0000 mg | DELAYED_RELEASE_TABLET | Freq: Every day | ORAL | 3 refills | Status: DC

## 2021-10-07 NOTE — Patient Instructions (Addendum)
[place] at [facility] Discharge Instructions  You were seen today by Tarri Abernethy PA-C for your abnormal blood counts.  Your blood counts have been looking much better lately, although you do still have some low platelets.  We suspect that your low platelets are related to your frequent urinary tract infections and antibiotic use.  We will continue to monitor your platelet counts and will consider further testing if they do not improve after resolution of your infections.  Your iron levels are slightly low, so we will have you start taking a daily iron supplement.  You should continue taking your vitamin B12 supplement and your Eliquis as previously prescribed.  LABS: Return in 3 months for repeat labs  FOLLOW-UP APPOINTMENT: Office visit after labs   Thank you for choosing [place] at [facility] to provide your oncology and hematology care.  To afford each patient quality time with our provider, please arrive at least 15 minutes before your scheduled appointment time.   If you have a lab appointment with the [facility] please come in thru the Main Entrance and check in at the main information desk.  You need to re-schedule your appointment should you arrive 10 or more minutes late.  We strive to give you quality time with our providers, and arriving late affects you and other patients whose appointments are after yours.  Also, if you no show three or more times for appointments you may be dismissed from the clinic at the providers discretion.     Again, thank you for choosing [facility].  Our hope is that these requests will decrease the amount of time that you wait before being seen by our physicians.       _____________________________________________________________  Should you have questions after your visit to [facility], please contact our office at [phone] and follow the prompts.  Our office  hours are 8:00 a.m. and 4:30 p.m. Monday - Friday.  Please note that voicemails left after 4:00 p.m. may not be returned until the following business day.  We are closed weekends and major holidays.  You do have access to a nurse 24-7, just call the main number to the clinic [phone] and do not press any options, hold on the line and a nurse will answer the phone.    For prescription refill requests, have your pharmacy contact our office and allow 72 hours.    Due to Covid, you will need to wear a mask upon entering the hospital. If you do not have a mask, a mask will be given to you at the Main Entrance upon arrival. For doctor visits, patients may have 1 support person age 51 or older with them. For treatment visits, patients can not have anyone with them due to social distancing guidelines and our immunocompromised population.

## 2021-10-08 ENCOUNTER — Telehealth: Payer: Self-pay

## 2021-10-08 ENCOUNTER — Other Ambulatory Visit: Payer: Medicaid Other

## 2021-10-08 ENCOUNTER — Other Ambulatory Visit: Payer: Self-pay | Admitting: Physician Assistant

## 2021-10-08 DIAGNOSIS — N3000 Acute cystitis without hematuria: Secondary | ICD-10-CM

## 2021-10-08 LAB — MICROSCOPIC EXAMINATION
Renal Epithel, UA: NONE SEEN /hpf
WBC, UA: >30 /hpf — AB (ref 0–5)

## 2021-10-08 LAB — URINALYSIS, ROUTINE W REFLEX MICROSCOPIC
Bilirubin, UA: NEGATIVE
Glucose, UA: NEGATIVE
Ketones, UA: NEGATIVE
Nitrite, UA: POSITIVE — AB
Protein,UA: NEGATIVE
Specific Gravity, UA: 1.01 (ref 1.005–1.030)
Urobilinogen, Ur: 0.2 mg/dL (ref 0.2–1.0)
pH, UA: 5.5 (ref 5.0–7.5)

## 2021-10-08 MED ORDER — NITROFURANTOIN MONOHYD MACRO 100 MG PO CAPS: 100.0000 mg | ORAL_CAPSULE | Freq: Two times a day (BID) | ORAL | 0 refills | Status: DC

## 2021-10-08 MED ORDER — CIPROFLOXACIN HCL 500 MG PO TABS: 500.0000 mg | ORAL_TABLET | Freq: Two times a day (BID) | ORAL | 0 refills | Status: DC

## 2021-10-08 NOTE — Telephone Encounter (Signed)
-----   Message from Reynaldo Minium, [place] sent at 10/08/2021  9:45 AM EST ----- Based on previous culture, Macrobid sent in to pt pharmacy. UA indicates probable UTI. Culture pending.

## 2021-10-08 NOTE — Telephone Encounter (Signed)
Patient called and made aware.

## 2021-10-08 NOTE — Telephone Encounter (Signed)
Patient called stating he began to itch all over after taking Macrobid.  Orpah Clinton PA called and made aware. N.O. received to d/c Macrobid and start Cipro 500 bid x 7 days.  Rx sent and patient made aware. Macrobid added to allergies.

## 2021-10-08 NOTE — Progress Notes (Signed)
Pt dropped urine WBC>30, many bact. Based on most recent cx, Macrobid x 7 days sent to pt pharmacy.

## 2021-10-12 ENCOUNTER — Other Ambulatory Visit: Payer: Self-pay

## 2021-10-12 LAB — URINE CULTURE

## 2021-10-12 MED ORDER — CEFPODOXIME PROXETIL 200 MG PO TABS: 200.0000 mg | ORAL_TABLET | Freq: Two times a day (BID) | ORAL | 0 refills | Status: DC

## 2021-10-13 ENCOUNTER — Telehealth: Payer: Self-pay

## 2021-10-13 DIAGNOSIS — N3000 Acute cystitis without hematuria: Secondary | ICD-10-CM

## 2021-10-13 NOTE — Telephone Encounter (Signed)
Started a PA through [employer] for Cefpodoxime- pending [id number]  Will take appox 24 hours to have approval/denial

## 2021-10-14 MED ORDER — CEFDINIR 300 MG PO CAPS: 300.0000 mg | ORAL_CAPSULE | Freq: Two times a day (BID) | ORAL | 0 refills | Status: DC

## 2021-10-14 NOTE — Addendum Note (Signed)
Addended by: Dorisann Frames on: 10/14/2021 04:26 PM   Modules accepted: Orders

## 2021-10-14 NOTE — Telephone Encounter (Signed)
[place] tracks denied Cefpodoxime as patient has not tried and failed other drugs in that category   Reviewed urine culture again with [place], [place]. New order for omnicef 300 mg BID x 7 days sent to pharmacy.

## 2021-10-27 ENCOUNTER — Telehealth: Payer: Self-pay | Admitting: Internal Medicine

## 2021-10-27 NOTE — Telephone Encounter (Signed)
Pt called asking when Dr Abbey Chatters wanted to see him again. We saw him last in Sept 2022 and he was referred to [facility]. He said that his hernia was back and needed to be seen. Does he need OV with [place] or follow up with [facility]? Please advise. [phone]

## 2021-10-27 NOTE — Telephone Encounter (Signed)
I haven't spoke with the pt but will he need a ov from [place] to be referred to see a general surgeon or can the girls go ahead and schedule?

## 2021-10-28 ENCOUNTER — Encounter: Payer: Self-pay | Admitting: Pulmonary Disease

## 2021-10-28 ENCOUNTER — Ambulatory Visit (INDEPENDENT_AMBULATORY_CARE_PROVIDER_SITE_OTHER): Payer: Medicaid Other | Admitting: Pulmonary Disease

## 2021-10-28 ENCOUNTER — Ambulatory Visit: Payer: Medicaid Other | Admitting: Urology

## 2021-10-28 ENCOUNTER — Other Ambulatory Visit: Payer: Self-pay

## 2021-10-28 VITALS — BP 114/78 | HR 72 | Ht 66.0 in | Wt 209.0 lb

## 2021-10-28 DIAGNOSIS — J449 Chronic obstructive pulmonary disease, unspecified: Secondary | ICD-10-CM | POA: Diagnosis not present

## 2021-10-28 DIAGNOSIS — J441 Chronic obstructive pulmonary disease with (acute) exacerbation: Secondary | ICD-10-CM | POA: Diagnosis not present

## 2021-10-28 MED ORDER — PREDNISONE 10 MG PO TABS
40.0000 mg | ORAL_TABLET | Freq: Every day | ORAL | 0 refills | Status: AC
Start: 2021-10-28 — End: 2021-11-02

## 2021-10-28 MED ORDER — AZITHROMYCIN 250 MG PO TABS: ORAL_TABLET | ORAL | 0 refills | Status: DC

## 2021-10-28 NOTE — Progress Notes (Signed)
Synopsis: Referred in January 2022 by Cherlynn Kaiser, MD for shortness of breath  Subjective:   PATIENT ID: [name] GENDER: male DOB: 19-Sep-1966, MRN: [id number]  HPI  Chief Complaint  Patient presents with   Follow-up   [name] is a 56 year old male, daily smoker with history of DVT/PE, crohn's disease s/p colectomy with ileostomy and GERD who returns to pulmonary clinic for follow up of asthma-copd overlap syndrome.   He had covid over the holidays and reports mainly sinus issues. He does have increased cough, phlegm production and shortness of breath when walking around his house.   He continues to do well on dulera 200-45mg 2 puffs twice daily and spiriva 2.521m 2 puffs daily. He is rarely using albuterol as needed.  He has cut down to 3 cigarettes per day from 1 pack per day when we first met.   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[facility]   right, s/p replacement, due to prednisone   Bipolar disorder ([facility]   [facility]   Crohn's colitis ([facility]   s/p total colectomy with ileostomy in 2009   [facility]   ileoscopy: multiple ulcers likely secondary to Cr[place]htonHX OF 02/11/2009   Qualifier: Diagnosis of  By: ShZeb Comfort   Depression    DVT (deep venous thrombosis) (HCAndrew[place]2009   right upper extremity due to PICC   Enterococcus UTI 2009   GERD (gastroesophageal reflux disease)    Hernia    Hyperlipidemia    Insomnia    Migraine headache    Nystagmus    PULMONARY EMBOLISM, HX OF 02/11/2009   Qualifier: Diagnosis of  By: ShZeb Comfort   S/P endoscopy Sept 2012   mild gastritis, small hiatal hernia, no ulcers   Schizophrenia ([facility]   Schizophrenia, acute (HCL[place]     Family History  Problem Relation Age of Onset   Diabetes Mother    Heart disease Mother    Diabetes Father    Heart disease Father     COPD Maternal Grandmother    Colon cancer Neg Hx      Social History   Socioeconomic History   Marital status: Single    Spouse name: Not on file   Number of children: Not on file   Years of education: Not on file   Highest education Lane: Not on file  Occupational History   Not on file  Tobacco Use   Smoking status: Every Day    Packs/day: 4.00    Years: 37.00    Pack years: 148.00    Types: Cigarettes   Smokeless tobacco: Never   Tobacco comments:    down to 4ppd 11/14/20  Vaping Use   Vaping Use: Never used  Substance and Sexual Activity   Alcohol use: No   Drug use: No   Sexual activity: Never  Other Topics Concern   Not on file  Social History Narrative   Girlfriend of six years, Rectal Cancer on Hospice (05/2011).   Social Determinants of Health   Financial Resource Strain: Not on file  Food Insecurity: Not on file  Transportation Needs: No Transportation Needs   Lack of Transportation (Medical): No   Lack of Transportation (Non-Medical): No  Physical Activity: Not on file  Stress: Not  on file  Social Connections: Not on file  Intimate Partner Violence: Not At Risk   Fear of Current or Ex-Partner: No   Emotionally Abused: No   Physically Abused: No   Sexually Abused: No     Allergies  Allergen Reactions   Penicillins Anaphylaxis   Bactrim [Sulfamethoxazole-Trimethoprim] Other (See Comments)    ABD CRAMPS AND DIARRHEA Welps/itch   Oxycodone-Acetaminophen Other (See Comments)    Other reaction(s): Swelling   Remicade [Infliximab] Other (See Comments)    COULDN'T BREATHE   Codeine     REACTION: unknown reaction   Ibuprofen     REACTION: unknown reaction   Macrobid [Nitrofurantoin] Itching   Tramadol Hcl     REACTION: unknown reaction   Aspirin     REACTION: unknown reaction   Tramadol Nausea Only    Other reaction(s): Nausea   Vicodin [Hydrocodone-Acetaminophen] Nausea Only     Outpatient Medications Prior to Visit  Medication Sig Dispense  Refill   ACCU-CHEK AVIVA PLUS test strip SMARTSIG:Via Meter     albuterol (PROVENTIL) (2.5 MG/3ML) 0.083% nebulizer solution Take 2.5 mg by nebulization every 6 (six) hours as needed for wheezing or shortness of breath.     albuterol (VENTOLIN HFA) 108 (90 Base) MCG/ACT inhaler Inhale 2 puffs into the lungs every 6 (six) hours as needed for wheezing or shortness of breath.     atorvastatin (LIPITOR) 80 MG tablet Take 80 mg by mouth daily.     azelastine (ASTELIN) 0.1 % nasal spray Place 2 sprays into both nostrils 2 (two) times daily.     baclofen (LIORESAL) 10 MG tablet Take 10 mg by mouth daily.     benztropine (COGENTIN) 0.5 MG tablet Take 0.5 mg by mouth daily.      CALCIUM 600/VITAMIN D3 600-800 MG-UNIT TABS TAKE (1) TABLET BY MOUTH THREE TIMES DAILY AFTER MEALS. 84 tablet 11   Cyanocobalamin (B-12) 1000 MCG CAPS Take 1 capsule by mouth daily.     dicyclomine (BENTYL) 10 MG capsule TAKE 1 CAPSULE BY MOUTH BEFORE MEALS AND AT BEDTIME AS NEEDED FOR ABDOMINAL CRAMPING. *HOLD FOR CONSTIPATION* 90 capsule 11   divalproex (DEPAKOTE) 250 MG DR tablet Take by mouth at bedtime.     divalproex (DEPAKOTE) 500 MG 24 hr tablet Take 1,000 mg by mouth every evening.     docusate sodium (COLACE) 100 MG capsule Take 1 capsule (100 mg total) by mouth 2 (two) times daily. 10 capsule 0   ELIQUIS 5 MG TABS tablet Take 5 mg by mouth 2 (two) times daily.     ferrous sulfate 325 (65 FE) MG EC tablet Take 1 tablet (325 mg total) by mouth daily with breakfast. 30 tablet 3   FLOVENT HFA 110 MCG/ACT inhaler Inhale 2 puffs into the lungs 2 (two) times daily.     furosemide (LASIX) 40 MG tablet Take 40 mg by mouth daily.     gabapentin (NEURONTIN) 300 MG capsule Take 900 mg by mouth 3 (three) times daily.     hydroxychloroquine (PLAQUENIL) 200 MG tablet Take 1 tablet by mouth 2 (two) times daily.     meloxicam (MOBIC) 7.5 MG tablet Take 7.5 mg by mouth daily.     metoprolol succinate (TOPROL-XL) 50 MG 24 hr tablet Take  1 tablet (50 mg total) by mouth daily. Take with or immediately following a meal. 90 tablet 3   Misc. Devices (WALKER WHEELS) MISC USE AS DIRECTED 1 each 0   mometasone-formoterol (DULERA) 200-5 MCG/ACT AERO Inhale  2 puffs into the lungs in the morning and at bedtime. 13 g 5   Ostomy Supplies (ACTIVE LIFE 1-PC DRAIN [phone]) Pouch MISC      pantoprazole (PROTONIX) 40 MG tablet TAKE (1) TABLET BY MOUTH TWICE A DAY BEFORE MEALS. (BREAKFAST AND SUPPER) (Patient taking differently: Take 40 mg by mouth 2 (two) times daily before a meal.) 56 tablet 11   potassium chloride SA (K-DUR,KLOR-CON) 20 MEQ tablet Take 20 mEq by mouth daily.      risperiDONE (RISPERDAL) 1 MG tablet Take 1 tablet by mouth at bedtime.     risperiDONE (RISPERDAL) 3 MG tablet Take 3 mg by mouth 2 (two) times daily.     sertraline (ZOLOFT) 50 MG tablet Take 50 mg by mouth daily.     tamsulosin (FLOMAX) 0.4 MG CAPS capsule Take 0.4 mg by mouth daily.     Tiotropium Bromide Monohydrate (SPIRIVA RESPIMAT) 2.5 MCG/ACT AERS Inhale 2 puffs into the lungs daily. 4 g 5   traMADol (ULTRAM) 50 MG tablet 1 tablet as needed     traZODone (DESYREL) 100 MG tablet Take 100 mg by mouth at bedtime.     cefdinir (OMNICEF) 300 MG capsule Take 1 capsule (300 mg total) by mouth 2 (two) times daily. 14 capsule 0   ciprofloxacin (CIPRO) 500 MG tablet Take 1 tablet (500 mg total) by mouth 2 (two) times daily. 14 tablet 0   nitroGLYCERIN (NITROSTAT) 0.4 MG SL tablet Place 1 tablet (0.4 mg total) under the tongue every 5 (five) minutes as needed for chest pain. 25 tablet 3   No facility-administered medications prior to visit.    Review of Systems  Constitutional:  Negative for chills, fever, malaise/fatigue and weight loss.  HENT:  Negative for congestion, sinus pain and sore throat.   Eyes: Negative.   Respiratory:  Positive for cough, sputum production and shortness of breath. Negative for hemoptysis and wheezing.   Cardiovascular:  Negative for  chest pain, palpitations, orthopnea, claudication and leg swelling.  Gastrointestinal:  Negative for abdominal pain, heartburn, nausea and vomiting.  Genitourinary: Negative.   Musculoskeletal:  Negative for joint pain and myalgias.  Skin:  Negative for rash.  Neurological:  Negative for weakness.  Endo/Heme/Allergies: Negative.   Psychiatric/Behavioral: Negative.     Objective:   Vitals:   10/28/21 1442  BP: 114/78  Pulse: 72  SpO2: 95%  Weight: 209 lb (94.8 kg)  Height: _0  (1.676 m)     Physical Exam Constitutional:      General: He is not in acute distress. HENT:     Head: Normocephalic and atraumatic.  Eyes:     Conjunctiva/sclera: Conjunctivae normal.  Cardiovascular:     Rate and Rhythm: Normal rate and regular rhythm.     Pulses: Normal pulses.     Heart sounds: Normal heart sounds. No murmur heard. Pulmonary:     Effort: Pulmonary effort is normal.     Breath sounds: Wheezing (left lung fields) present. No rhonchi.  Musculoskeletal:     Right lower leg: No edema.     Left lower leg: No edema.  Skin:    General: Skin is warm and dry.  Neurological:     General: No focal deficit present.     Mental Status: He is alert.  Psychiatric:        Mood and Affect: Mood normal.        Behavior: Behavior normal.        Thought Content: Thought content normal.  Judgment: Judgment normal.   CBC    Component Value Date/Time   WBC 4.1 09/15/2021 0937   RBC 4.17 (L) 09/15/2021 0937   HGB 13.9 09/15/2021 0937   HCT 40.9 09/15/2021 0937   HCT 42 05/24/2011 1417   PLT 88 (L) 09/15/2021 0937   MCV 98.1 09/15/2021 0937   MCH 33.3 09/15/2021 0937   MCHC 34.0 09/15/2021 0937   RDW 14.7 09/15/2021 0937   LYMPHSABS 1.0 09/15/2021 0937   MONOABS 0.5 09/15/2021 0937   EOSABS 0.1 09/15/2021 0937   BASOSABS 0.0 09/15/2021 0937   BMP Latest Ref Rng & Units 09/15/2021 05/18/2021 05/17/2021  Glucose 70 - 99 mg/dL 97 84 92  BUN 6 - 20 mg/dL 5(L) 13 14  Creatinine  0.61 - 1.24 mg/dL 0.85 0.61 0.68  BUN/Creat Ratio 6 - 22 (calc) - - -  Sodium 135 - 145 mmol/L 138 140 134(L)  Potassium 3.5 - 5.1 mmol/L 4.5 3.9 3.8  Chloride 98 - 111 mmol/L 102 110 106  CO2 22 - 32 mmol/L 29 23 21(L)  Calcium 8.9 - 10.3 mg/dL 9.2 7.8(L) 7.7(L)   Chest imaging: CT Chest LCS 03/16/21 1. Lung-RADS 2, benign appearance or behavior. Continue annual screening with low-dose chest CT without contrast in 12 months. 2. One vessel coronary atherosclerosis. 3. Aortic Atherosclerosis (ICD10-I70.0) and Emphysema (ICD10-J43.9).  CT Coronary 08/07/20 Calcifications in the superior vena cava 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 Nonocclusive filling defect in the distal right lower lobe pulmonary artery branch extending into a segmental sized branch. Within the visualized portions of the thorax there are no suspicious appearing pulmonary nodules or masses, there is no acute consolidative airspace disease, no pleural effusions, no pneumothorax and no lymphadenopathy.  PFT: PFT Results Latest Ref Rng & Units 01/09/2021  FVC-Pre L 2.94  FVC-Predicted Pre % 68  FVC-Post L 3.42  FVC-Predicted Post % 80  Pre FEV1/FVC % % 59  Post FEV1/FCV % % 66  FEV1-Pre L 1.73  FEV1-Predicted Pre % 52  FEV1-Post L 2.27  DLCO uncorrected ml/min/mmHg 22.68  DLCO UNC% % 90  DLCO corrected ml/min/mmHg 22.68  DLCO COR %Predicted % 90  DLVA Predicted % 85  TLC L 6.84  TLC % Predicted % 110  RV % Predicted % 203  01/09/21: Moderately severe obstruction, signs of gas trapping present with significant bronchodilator response.   Echo 11/13/20:  1. Left ventricular ejection fraction, by estimation, is 55 to 60%. The  left ventricle has normal function. Left ventricular endocardial border  not optimally defined to evaluate regional wall motion. Left ventricular  diastolic parameters were normal.   2. Left atrial size was mildly dilated.   3. The mitral valve is normal in structure. Trivial mitral  valve  regurgitation.   4. The aortic valve is tricuspid. Mild aortic valve sclerosis is present,  with no evidence of aortic valve stenosis.   5. Tricuspid regurgitation signal is inadequate for assessing PA  pressure.   [place] lower extremity 06/05/20 No evidence of deep venous thrombosis seen in left lower extremity. Probable superficial thrombophlebitis seen involving the left superficial greater saphenous vein.  Assessment & Plan:   Asthma-COPD overlap syndrome ([place]) - Plan: Ambulatory Referral for DME  COPD with acute exacerbation ([place]) - Plan: predniSONE (DELTASONE) 10 MG tablet, azithromycin (ZITHROMAX) 250 MG tablet  Discussion: [name] is a 56 year old male, daily smoker with history of DVT/PE, crohn's disease s/p colectomy with ileostomy and GERD who returns to pulmonary clinic for follow  up of asthma-copd overlap syndrome.   He appears to have COPD exacerbation at this time with increased cough, sputum production and wheezing on exam. He is to take prednisone 78m daily for 5 days and complete a course of azithromycin.   He is to continue dulera 200-563m 2 puffs twice daily and spiriva 2.5 mcg 2 puffs daily. He can continue as needed albuterol.   He will continue to work on cutting back further on his daily cigarette use. He has made tremendous strides since our first visit and is now down to  3 cigarettes per day.   He is on eliquis for DVT/PE.   He is enrolled in our lung cancer screening program.  Follow up in 6 months.  JoFreda JacksonMD [facility] Office: [phone] Current Outpatient Medications:    ACCU-CHEK AVIVA PLUS test strip, SMARTSIG:Via Meter, Disp: , Rfl:    albuterol (PROVENTIL) (2.5 MG/3ML) 0.083% nebulizer solution, Take 2.5 mg by nebulization every 6 (six) hours as needed for wheezing or shortness of breath., Disp: , Rfl:    albuterol (VENTOLIN HFA) 108 (90 Base) MCG/ACT inhaler, Inhale 2 puffs into the lungs every 6  (six) hours as needed for wheezing or shortness of breath., Disp: , Rfl:    atorvastatin (LIPITOR) 80 MG tablet, Take 80 mg by mouth daily., Disp: , Rfl:    azelastine (ASTELIN) 0.1 % nasal spray, Place 2 sprays into both nostrils 2 (two) times daily., Disp: , Rfl:    azithromycin (ZITHROMAX) 250 MG tablet, Take as directed, Disp: 6 tablet, Rfl: 0   baclofen (LIORESAL) 10 MG tablet, Take 10 mg by mouth daily., Disp: , Rfl:    benztropine (COGENTIN) 0.5 MG tablet, Take 0.5 mg by mouth daily. , Disp: , Rfl:    CALCIUM 600/VITAMIN D3 600-800 MG-UNIT TABS, TAKE (1) TABLET BY MOUTH THREE TIMES DAILY AFTER MEALS., Disp: 84 tablet, Rfl: 11   Cyanocobalamin (B-12) 1000 MCG CAPS, Take 1 capsule by mouth daily., Disp: , Rfl:    dicyclomine (BENTYL) 10 MG capsule, TAKE 1 CAPSULE BY MOUTH BEFORE MEALS AND AT BEDTIME AS NEEDED FOR ABDOMINAL CRAMPING. *HOLD FOR CONSTIPATION*, Disp: 90 capsule, Rfl: 11   divalproex (DEPAKOTE) 250 MG DR tablet, Take by mouth at bedtime., Disp: , Rfl:    divalproex (DEPAKOTE) 500 MG 24 hr tablet, Take 1,000 mg by mouth every evening., Disp: , Rfl:    docusate sodium (COLACE) 100 MG capsule, Take 1 capsule (100 mg total) by mouth 2 (two) times daily., Disp: 10 capsule, Rfl: 0   ELIQUIS 5 MG TABS tablet, Take 5 mg by mouth 2 (two) times daily., Disp: , Rfl:    ferrous sulfate 325 (65 FE) MG EC tablet, Take 1 tablet (325 mg total) by mouth daily with breakfast., Disp: 30 tablet, Rfl: 3   FLOVENT HFA 110 MCG/ACT inhaler, Inhale 2 puffs into the lungs 2 (two) times daily., Disp: , Rfl:    furosemide (LASIX) 40 MG tablet, Take 40 mg by mouth daily., Disp: , Rfl:    gabapentin (NEURONTIN) 300 MG capsule, Take 900 mg by mouth 3 (three) times daily., Disp: , Rfl:    hydroxychloroquine (PLAQUENIL) 200 MG tablet, Take 1 tablet by mouth 2 (two) times daily., Disp: , Rfl:    meloxicam (MOBIC) 7.5 MG tablet, Take 7.5 mg by mouth daily., Disp: , Rfl:    metoprolol succinate (TOPROL-XL) 50 MG 24  hr tablet, Take 1 tablet (50 mg total) by mouth  daily. Take with or immediately following a meal., Disp: 90 tablet, Rfl: 3   Misc. Devices (WALKER WHEELS) MISC, USE AS DIRECTED, Disp: 1 each, Rfl: 0   mometasone-formoterol (DULERA) 200-5 MCG/ACT AERO, Inhale 2 puffs into the lungs in the morning and at bedtime., Disp: 13 g, Rfl: 5   Ostomy Supplies (ACTIVE LIFE 1-PC DRAIN [phone]) Pouch MISC, , Disp: , Rfl:    pantoprazole (PROTONIX) 40 MG tablet, TAKE (1) TABLET BY MOUTH TWICE A DAY BEFORE MEALS. (BREAKFAST AND SUPPER) (Patient taking differently: Take 40 mg by mouth 2 (two) times daily before a meal.), Disp: 56 tablet, Rfl: 11   potassium chloride SA (K-DUR,KLOR-CON) 20 MEQ tablet, Take 20 mEq by mouth daily. , Disp: , Rfl:    risperiDONE (RISPERDAL) 1 MG tablet, Take 1 tablet by mouth at bedtime., Disp: , Rfl:    risperiDONE (RISPERDAL) 3 MG tablet, Take 3 mg by mouth 2 (two) times daily., Disp: , Rfl:    sertraline (ZOLOFT) 50 MG tablet, Take 50 mg by mouth daily., Disp: , Rfl:    tamsulosin (FLOMAX) 0.4 MG CAPS capsule, Take 0.4 mg by mouth daily., Disp: , Rfl:    Tiotropium Bromide Monohydrate (SPIRIVA RESPIMAT) 2.5 MCG/ACT AERS, Inhale 2 puffs into the lungs daily., Disp: 4 g, Rfl: 5   traMADol (ULTRAM) 50 MG tablet, 1 tablet as needed, Disp: , Rfl:    traZODone (DESYREL) 100 MG tablet, Take 100 mg by mouth at bedtime., Disp: , Rfl:    nitroGLYCERIN (NITROSTAT) 0.4 MG SL tablet, Place 1 tablet (0.4 mg total) under the tongue every 5 (five) minutes as needed for chest pain., Disp: 25 tablet, Rfl: 3

## 2021-10-28 NOTE — Telephone Encounter (Signed)
I think patient needs to follow back up with [facility].  Thank you

## 2021-10-28 NOTE — Patient Instructions (Signed)
Take prednisone 19m daily for 5 days  Take Zpak for 5 days  Continue dulera 2 puffs twice daily  Continue spiriva 2 puffs daily  Continue to work on cutting back further on your daily cigarette use. I am proud that you have gotten down to 3 cigarettes per day a majority of the time.   Follow up in 6 months

## 2021-10-29 ENCOUNTER — Ambulatory Visit: Payer: Medicaid Other | Admitting: Physician Assistant

## 2021-10-29 NOTE — Telephone Encounter (Signed)
Returned the pt's call and advised to follow-up with [facility]. Pt expressed understanding

## 2021-11-05 ENCOUNTER — Other Ambulatory Visit (HOSPITAL_COMMUNITY): Payer: Self-pay | Admitting: Surgery

## 2021-11-05 ENCOUNTER — Other Ambulatory Visit: Payer: Self-pay | Admitting: Surgery

## 2021-11-05 DIAGNOSIS — K433 Parastomal hernia with obstruction, without gangrene: Secondary | ICD-10-CM

## 2021-11-08 ENCOUNTER — Encounter: Payer: Self-pay | Admitting: Pulmonary Disease

## 2021-11-11 ENCOUNTER — Other Ambulatory Visit: Payer: Self-pay

## 2021-11-11 ENCOUNTER — Ambulatory Visit (HOSPITAL_COMMUNITY)
Admission: RE | Admit: 2021-11-11 | Discharge: 2021-11-11 | Disposition: A | Discharge status: Home / Self Care | Payer: Medicaid Other | Source: Ambulatory Visit | Attending: Surgery | Admitting: Surgery

## 2021-11-11 DIAGNOSIS — K433 Parastomal hernia with obstruction, without gangrene: Secondary | ICD-10-CM | POA: Insufficient documentation

## 2021-11-13 ENCOUNTER — Other Ambulatory Visit: Payer: Medicaid Other | Admitting: Urology

## 2021-12-06 NOTE — Progress Notes (Signed)
Cardiology Office Note Date:  12/08/2021  Patient ID:  [name], [name] 1966-04-19, MRN [id number] PCP:  [name], MD  Cardiologist:  Dr. Margaretann Loveless Electrophysiologist: Dr. Curt Bears    Chief Complaint: 6 mo  History of Present Illness: [name] is a 56 y.o. male with history of COPD/asthma, DVT/PE, Crohn's disease with total colectomy and ileostomy in 2009, AVN (s/p R hip replacement, 2/2 steroids), schizophrenia, SVT  He saw Dr. Margaretann Loveless April 2022, with some CP described as precordial pain, had mild CAD by CTa Nov 2021, planned to try s/l prn NTG.  Consider long acting NTG if helpful if BP tolerates  He comes today to be seen for Dr. Curt Bears, last seen by him Sept 2022, discussed monitor that showed episodes of SVT.  He was put on Toprol-XL.  His SVT episodes lasted up to 1 minute and 30 seconds.  During his pause episode of 3.7 seconds, he was asleep at the time. Still having palpitations/SVTs, pt reports some lasting an hour or so. Given comorbid conditions, preferred to avoid invasive procedures, his Toprol increased  TODAY He is doing well Has not had any palpitations or heart racing No CP No SOB He walks with a rolling walker, 2/2 weak legs chronically No bleeding or signs of bleeding No near syncope or syncope. His BP is low today, denies any orthostatic dizziness, lightheaded spells of any kind   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[place])    right, s/p replacement, due to prednisone   Bipolar disorder ([place])    Colostomy in place [facility])    Crohn's colitis ([place])    s/p total colectomy with ileostomy in 2009   Crohn's disease of small intestine [facility]) Sept 2012   ileoscopy: multiple ulcers likely secondary to [place], HX OF 02/11/2009   Qualifier: Diagnosis of  By: Zeb Comfort     Depression    DVT (deep venous thrombosis) ([place]) 2009   right upper extremity due  to PICC   Enterococcus UTI 2009   GERD (gastroesophageal reflux disease)    Hernia    Hyperlipidemia    Insomnia    Migraine headache    Nystagmus    PULMONARY EMBOLISM, HX OF 02/11/2009   Qualifier: Diagnosis of  By: Zeb Comfort     S/P endoscopy Sept 2012   mild gastritis, small hiatal hernia, no ulcers   Schizophrenia ([place])    Schizophrenia, acute ([place])     Past Surgical History:  Procedure Laterality Date   APPENDECTOMY     ESOPHAGOGASTRODUODENOSCOPY  07/2010   gastritis,  bx neg for H.Pylori   ESOPHAGOGASTRODUODENOSCOPY N/A 01/08/2014   SLF:NO SOURCE FOR ODYNOPHAGIA IDENTIFIED/Multiple small ulcers in the gastric antrum   EYE SURGERY     ILEOSCOPY  06/29/2011   SLF: ulcers,multiple/small HH/mild gastritis   KIDNEY SURGERY     LAPAROTOMY N/A 06/05/2013   Procedure: EXPLORATORY LAPAROTOMY;  Surgeon: Donato Heinz, MD;  Location: [facility] OR[facility];  Service: General;  Laterality: N/A;   LYSIS OF ADHESION N/A 06/05/2013   Procedure: LYSIS OF ADHESIONS;  Surgeon: Donato Heinz, MD;  Location: [facility] ORS;  Service: General;  Laterality: N/A;   small bowel capsule study  08/2010   few tiny nonbleeding erosions/ulcers ?secondary to relafen or Crohn's. Entire small bowel not seen.    TOTAL COLECTOMY  2009   with ileostomy at [facility]  for refractory disease    TOTAL HIP ARTHROPLASTY     right, due to avascular necrosis from chronic prednisone   TOTAL SHOULDER REPLACEMENT     bilateral   TRANSURETHRAL RESECTION OF PROSTATE N/A 12/03/2019   Procedure: TRANSURETHRAL RESECTION OF THE PROSTATE (TURP);  Surgeon: Cleon Gustin, MD;  Location: [facility] ORS;  Service: Urology;  Laterality: N/A;    Current Outpatient Medications  Medication Sig Dispense Refill   ACCU-CHEK AVIVA PLUS test strip SMARTSIG:Via Meter     albuterol (PROVENTIL) (2.5 MG/3ML) 0.083% nebulizer solution Take 2.5 mg by nebulization every 6 (six) hours as needed for wheezing or shortness of breath.     albuterol (VENTOLIN HFA) 108  (90 Base) MCG/ACT inhaler Inhale 2 puffs into the lungs every 6 (six) hours as needed for wheezing or shortness of breath.     atorvastatin (LIPITOR) 80 MG tablet Take 80 mg by mouth daily.     azelastine (ASTELIN) 0.1 % nasal spray Place 2 sprays into both nostrils 2 (two) times daily.     baclofen (LIORESAL) 10 MG tablet Take 10 mg by mouth daily.     benztropine (COGENTIN) 0.5 MG tablet Take 0.5 mg by mouth daily.      CALCIUM 600/VITAMIN D3 600-800 MG-UNIT TABS TAKE (1) TABLET BY MOUTH THREE TIMES DAILY AFTER MEALS. 84 tablet 11   dicyclomine (BENTYL) 10 MG capsule TAKE 1 CAPSULE BY MOUTH BEFORE MEALS AND AT BEDTIME AS NEEDED FOR ABDOMINAL CRAMPING. *HOLD FOR CONSTIPATION* 90 capsule 11   divalproex (DEPAKOTE) 250 MG DR tablet Take by mouth at bedtime.     divalproex (DEPAKOTE) 500 MG 24 hr tablet Take 1,000 mg by mouth every evening.     docusate sodium (COLACE) 100 MG capsule Take 1 capsule (100 mg total) by mouth 2 (two) times daily. 10 capsule 0   ELIQUIS 5 MG TABS tablet Take 5 mg by mouth 2 (two) times daily.     ferrous sulfate 325 (65 FE) MG EC tablet Take 1 tablet (325 mg total) by mouth daily with breakfast. 30 tablet 3   FLOVENT HFA 110 MCG/ACT inhaler Inhale 2 puffs into the lungs 2 (two) times daily.     furosemide (LASIX) 40 MG tablet Take 40 mg by mouth daily.     gabapentin (NEURONTIN) 300 MG capsule Take 900 mg by mouth 3 (three) times daily.     hydroxychloroquine (PLAQUENIL) 200 MG tablet Take 1 tablet by mouth 2 (two) times daily.     meloxicam (MOBIC) 7.5 MG tablet Take 7.5 mg by mouth daily.     Misc. Devices (WALKER WHEELS) MISC USE AS DIRECTED 1 each 0   mometasone-formoterol (DULERA) 200-5 MCG/ACT AERO Inhale 2 puffs into the lungs in the morning and at bedtime. 13 g 5   Ostomy Supplies (ACTIVE LIFE 1-PC DRAIN [phone]) Pouch MISC      pantoprazole (PROTONIX) 40 MG tablet TAKE (1) TABLET BY MOUTH TWICE A DAY BEFORE MEALS. (BREAKFAST AND SUPPER) 56 tablet 11    risperiDONE (RISPERDAL) 3 MG tablet Take 3 mg by mouth 2 (two) times daily.     sertraline (ZOLOFT) 50 MG tablet Take 50 mg by mouth daily.     tamsulosin (FLOMAX) 0.4 MG CAPS capsule Take 0.4 mg by mouth daily.     Tiotropium Bromide Monohydrate (SPIRIVA RESPIMAT) 2.5 MCG/ACT AERS Inhale 2 puffs into the lungs daily. 4 g 5   traMADol (ULTRAM) 50 MG tablet 1 tablet as needed  traZODone (DESYREL) 100 MG tablet Take 100 mg by mouth at bedtime.     cyanocobalamin 1000 MCG tablet Take 1 tablet by mouth daily.     folic acid (FOLVITE) 1 MG tablet Take 1 tablet by mouth daily.     metoprolol succinate (TOPROL-XL) 25 MG 24 hr tablet Take 1 tablet (25 mg total) by mouth in the morning and at bedtime. Take with or immediately following a meal. 180 tablet 2   nitroGLYCERIN (NITROSTAT) 0.4 MG SL tablet Place 1 tablet (0.4 mg total) under the tongue every 5 (five) minutes as needed for chest pain. 25 tablet 3   No current facility-administered medications for this visit.    Allergies:   Penicillins, Bactrim [sulfamethoxazole-trimethoprim], Oxycodone-acetaminophen, Remicade [infliximab], Aspirin, Codeine, Ibuprofen, Macrobid [nitrofurantoin], Tramadol, Tramadol hcl, and Vicodin [hydrocodone-acetaminophen]   Social History:  The patient  reports that he has been smoking cigarettes. He has a 148.00 pack-year smoking history. He has never used smokeless tobacco. He reports that he does not drink alcohol and does not use drugs.   Family History:  The patient's family history includes COPD in his maternal grandmother; Diabetes in his father and mother; Heart disease in his father and mother.  ROS:  Please see the history of present illness.    All other systems are reviewed and otherwise negative.   PHYSICAL EXAM:  VS:  BP (!) 80/52    Pulse (!) 59    Ht 5' 6"  (1.676 m)    Wt 223 lb (101.2 kg)    SpO2 96%    BMI 35.99 kg/m  BMI: Body mass index is 35.99 kg/m. A recheck of his BP my myself was  100/60 Well nourished, well developed, in no acute distress HEENT: normocephalic, atraumatic, nystagmus Neck: no JVD, carotid bruits or masses Cardiac:  RRR; no significant murmurs, no rubs, or gallops Lungs:  CTA b/l, no wheezing, rhonchi or rales Abd: soft, nontender MS: no deformity or atrophy Ext: no edema Skin: warm and dry, no rash Neuro:  No gross deficits appreciated Psych: euthymic mood, full affect   EKG: not done today  TTE 11/13/20  Review of the above records today demonstrates:   1. Left ventricular ejection fraction, by estimation, is 55 to 60%. The  left ventricle has normal function. Left ventricular endocardial border  not optimally defined to evaluate regional wall motion. Left ventricular  diastolic parameters were normal.   2. Left atrial size was mildly dilated.   3. The mitral valve is normal in structure. Trivial mitral valve  regurgitation.   4. The aortic valve is tricuspid. Mild aortic valve sclerosis is present,  with no evidence of aortic valve stenosis.   5. Tricuspid regurgitation signal is inadequate for assessing PA  pressure.    Cardiac monitor 12/27/2020 Minimum HR (bpm): 41  Maximum HR (bpm): 176 Supraventricular Ectopy: rare <1% SVT: 9 runs, 19 beats at rate of 141 and 1 min 32 sec at rate of 119 bpm. Ventricular Ectopy: rare <1% NSVT: none Ventricular Tachycardia: none Pauses: one pause lasting 3.7 seconds at 5:25am on 11/06/20 AV block: none Atrial fibrillation: none IMPRESSION: One >3 second pause and SVT noted on monitor, no symptoms recorded.  Recent Labs: 09/15/2021: ALT 25; BUN 5; Creatinine, Ser 0.85; Hemoglobin 13.9; Platelets 88; Potassium 4.5; Sodium 138  No results found for requested labs within last 8760 hours.   CrCl cannot be calculated (Patient's most recent lab result is older than the maximum 21 days allowed.).   Wt  Readings from Last 3 Encounters:  12/08/21 223 lb (101.2 kg)  10/28/21 209 lb (94.8 kg)  10/07/21  208 lb 8 oz (94.6 kg)     Other studies reviewed: Additional studies/records reviewed today include: summarized above  ASSESSMENT AND PLAN:  SVT None since the last titration of toprol No symptoms of bradycardia/pauses Initial BP was low, my repeat was 100/60 Will change his toprol to 33m BID   Disposition: F/u with [facility] if needed  Current medicines are reviewed at length with the patient today.  The patient did not have any concerns regarding medicines.  SiVenetia NightPA-C 12/08/2021 2:36 PM     [place] [phone])  ([phone])

## 2021-12-08 ENCOUNTER — Ambulatory Visit (INDEPENDENT_AMBULATORY_CARE_PROVIDER_SITE_OTHER): Payer: Medicaid Other | Admitting: Physician Assistant

## 2021-12-08 ENCOUNTER — Encounter: Payer: Self-pay | Admitting: Physician Assistant

## 2021-12-08 ENCOUNTER — Other Ambulatory Visit: Payer: Self-pay

## 2021-12-08 VITALS — BP 80/52 | HR 59 | Ht 66.0 in | Wt 223.0 lb

## 2021-12-08 DIAGNOSIS — I471 Supraventricular tachycardia: Secondary | ICD-10-CM | POA: Diagnosis not present

## 2021-12-08 MED ORDER — METOPROLOL SUCCINATE ER 25 MG PO TB24
25.0000 mg | ORAL_TABLET | Freq: Two times a day (BID) | ORAL | 2 refills | Status: DC
Start: 2021-12-08 — End: 2022-09-09

## 2021-12-08 MED ORDER — METOPROLOL SUCCINATE ER 25 MG PO TB24: 25.0000 mg | ORAL_TABLET | Freq: Every day | ORAL | 2 refills | Status: DC

## 2021-12-08 NOTE — Patient Instructions (Addendum)
Medication Instructions:  ? ?START TAKING TOPROL 25 MG TWICE A DAY  ? ?*If you need a refill on your cardiac medications before your next appointment, please call your pharmacy* ? ? ?Lab Work:  NONE ORDERED  TODAY ? ? ?If you have labs (blood work) drawn today and your tests are completely normal, you will receive your results only by: ?MyChart Message (if you have MyChart) OR ?A paper copy in the mail ?If you have any lab test that is abnormal or we need to change your treatment, we will call you to review the results. ? ? ?Testing/Procedures: NONE ORDERED  TODAY ? ? ? ?Follow-Up: ?At [facility], you and your health needs are our priority.  As part of our continuing mission to provide you with exceptional heart care, we have created designated Provider Care Teams.  These Care Teams include your primary Cardiologist (physician) and Advanced Practice Providers (APPs -  Physician Assistants and Nurse Practitioners) who all work together to provide you with the care you need, when you need it. ? ?We recommend signing up for the patient portal called "MyChart".  Sign up information is provided on this After Visit Summary.  MyChart is used to connect with patients for Virtual Visits (Telemedicine).  Patients are able to view lab/test results, encounter notes, upcoming appointments, etc.  Non-urgent messages can be sent to your provider as well.   ?To learn more about what you can do with MyChart, go to [email or web address].   ? ?Your next appointment:   ?6 month(s) ? ?The format for your next appointment:   ?In Person ? ?Provider:   ?Tommye Standard, PA-C{ ? ? ?Other Instructions ? ?

## 2021-12-09 ENCOUNTER — Telehealth: Payer: Self-pay

## 2021-12-09 ENCOUNTER — Other Ambulatory Visit: Payer: Self-pay | Admitting: Urology

## 2021-12-09 DIAGNOSIS — N390 Urinary tract infection, site not specified: Secondary | ICD-10-CM

## 2021-12-09 MED ORDER — CEFDINIR 300 MG PO CAPS: 300.0000 mg | ORAL_CAPSULE | Freq: Two times a day (BID) | ORAL | 0 refills | Status: AC

## 2021-12-09 NOTE — Telephone Encounter (Signed)
Patient called office today with UTI symptoms appox 1 week.  ? ?Offered patient office visit to assess for treatment- patient has to arrange transportation with 3 day notice. ? ?Reviewed with Dr. Felipa Eth and patient previous culture- informed patient we would give medication off previous cultures and if symptoms persist or worsen to call office and he will need an office visit. Patient voiced understanding.  ?

## 2021-12-14 NOTE — Progress Notes (Unsigned)
Cardiology Clinic Note   Patient Name: [name] Date of Encounter: 12/14/2021  Primary Care Provider:  [name], MD Primary Cardiologist:  None  Patient Profile    ***  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[place])    right, s/p replacement, due to prednisone   Bipolar disorder ([place])    Colostomy in place [facility])    Crohn's colitis ([place])    s/p total colectomy with ileostomy in 2009   Crohn's disease of small intestine [facility]) Sept 2012   ileoscopy: multiple ulcers likely secondary to [place], HX OF 02/11/2009   Qualifier: Diagnosis of  By: Zeb Comfort     Depression    DVT (deep venous thrombosis) ([place]) 2009   right upper extremity due to PICC   Enterococcus UTI 2009   GERD (gastroesophageal reflux disease)    Hernia    Hyperlipidemia    Insomnia    Migraine headache    Nystagmus    PULMONARY EMBOLISM, HX OF 02/11/2009   Qualifier: Diagnosis of  By: Zeb Comfort     S/P endoscopy Sept 2012   mild gastritis, small hiatal hernia, no ulcers   Schizophrenia ([place])    Schizophrenia, acute ([place])    Past Surgical History:  Procedure Laterality Date   APPENDECTOMY     ESOPHAGOGASTRODUODENOSCOPY  07/2010   gastritis,  bx neg for H.Pylori   ESOPHAGOGASTRODUODENOSCOPY N/A 01/08/2014   SLF:NO SOURCE FOR ODYNOPHAGIA IDENTIFIED/Multiple small ulcers in the gastric antrum   EYE SURGERY     ILEOSCOPY  06/29/2011   SLF: ulcers,multiple/small HH/mild gastritis   KIDNEY SURGERY     LAPAROTOMY N/A 06/05/2013   Procedure: EXPLORATORY LAPAROTOMY;  Surgeon: Donato Heinz, MD;  Location: [facility] OR[facility];  Service: General;  Laterality: N/A;   LYSIS OF ADHESION N/A 06/05/2013   Procedure: LYSIS OF ADHESIONS;  Surgeon: Donato Heinz, MD;  Location: [facility] ORS;  Service: General;  Laterality: N/A;   small bowel capsule study  08/2010   few tiny nonbleeding erosions/ulcers  ?secondary to relafen or Crohn's. Entire small bowel not seen.    TOTAL COLECTOMY  2009   with ileostomy at [facility] for refractory disease    TOTAL HIP ARTHROPLASTY     right, due to avascular necrosis from chronic prednisone   TOTAL SHOULDER REPLACEMENT     bilateral   TRANSURETHRAL RESECTION OF PROSTATE N/A 12/03/2019   Procedure: TRANSURETHRAL RESECTION OF THE PROSTATE (TURP);  Surgeon: Cleon Gustin, MD;  Location: [facility] ORS;  Service: Urology;  Laterality: N/A;    Allergies  Allergies  Allergen Reactions   Penicillins Anaphylaxis   Bactrim [Sulfamethoxazole-Trimethoprim] Other (See Comments)    ABD CRAMPS AND DIARRHEA Welps/itch   Oxycodone-Acetaminophen Other (See Comments)    Other reaction(s): Swelling   Remicade [Infliximab] Other (See Comments)    COULDN'T BREATHE   Aspirin     REACTION: unknown reaction   Codeine     REACTION: unknown reaction   Ibuprofen     REACTION: unknown reaction   Macrobid [Nitrofurantoin] Itching   Tramadol Nausea Only    Other reaction(s): Nausea   Tramadol Hcl     REACTION: unknown reaction   Vicodin [Hydrocodone-Acetaminophen] Nausea Only    History of Present Illness    ***  Home Medications    Prior to Admission medications   Medication  Sig Start Date End Date Taking? Authorizing Provider  cefdinir (OMNICEF) 300 MG capsule Take 1 capsule (300 mg total) by mouth 2 (two) times daily for 7 days. 12/09/21 12/16/21  Stoneking, Reece Leader., MD  ACCU-CHEK AVIVA PLUS test strip SMARTSIG:Via Meter 05/02/20   [provider]  albuterol (PROVENTIL) (2.5 MG/3ML) 0.083% nebulizer solution Take 2.5 mg by nebulization every 6 (six) hours as needed for wheezing or shortness of breath.    [provider]  albuterol (VENTOLIN HFA) 108 (90 Base) MCG/ACT inhaler Inhale 2 puffs into the lungs every 6 (six) hours as needed for wheezing or shortness of breath.    [provider]  atorvastatin (LIPITOR) 80 MG tablet Take 80 mg  by mouth daily.    [provider]  azelastine (ASTELIN) 0.1 % nasal spray Place 2 sprays into both nostrils 2 (two) times daily. 05/15/20   [provider]  baclofen (LIORESAL) 10 MG tablet Take 10 mg by mouth daily. 10/04/19   [provider]  benztropine (COGENTIN) 0.5 MG tablet Take 0.5 mg by mouth daily.     [provider]  CALCIUM 600/VITAMIN D3 600-800 MG-UNIT TABS TAKE (1) TABLET BY MOUTH THREE TIMES DAILY AFTER MEALS. 06/05/21   Mahala Menghini, PA-C  cyanocobalamin 1000 MCG tablet Take 1 tablet by mouth daily.    [provider]  dicyclomine (BENTYL) 10 MG capsule TAKE 1 CAPSULE BY MOUTH BEFORE MEALS AND AT BEDTIME AS NEEDED FOR ABDOMINAL CRAMPING. *HOLD FOR CONSTIPATION* 06/05/21   Mahala Menghini, PA-C  divalproex (DEPAKOTE) 250 MG DR tablet Take by mouth at bedtime. 11/21/20   [provider]  divalproex (DEPAKOTE) 500 MG 24 hr tablet Take 1,000 mg by mouth every evening.    [provider]  docusate sodium (COLACE) 100 MG capsule Take 1 capsule (100 mg total) by mouth 2 (two) times daily. 02/14/18   Kathie Dike, MD  ELIQUIS 5 MG TABS tablet Take 5 mg by mouth 2 (two) times daily. 07/14/20   [provider]  ferrous sulfate 325 (65 FE) MG EC tablet Take 1 tablet (325 mg total) by mouth daily with breakfast. 10/07/21   Harriett Rush, PA-C  FLOVENT HFA 110 MCG/ACT inhaler Inhale 2 puffs into the lungs 2 (two) times daily. 01/19/21   [provider]  folic acid (FOLVITE) 1 MG tablet Take 1 tablet by mouth daily.    [provider]  furosemide (LASIX) 40 MG tablet Take 40 mg by mouth daily.    [provider]  gabapentin (NEURONTIN) 300 MG capsule Take 900 mg by mouth 3 (three) times daily.    [provider]  hydroxychloroquine (PLAQUENIL) 200 MG tablet Take 1 tablet by mouth 2 (two) times daily. 07/06/21   [provider]  meloxicam (MOBIC) 7.5 MG tablet Take 7.5 mg by  mouth daily. 11/08/19   [provider]  metoprolol succinate (TOPROL-XL) 25 MG 24 hr tablet Take 1 tablet (25 mg total) by mouth in the morning and at bedtime. Take with or immediately following a meal. 12/08/21   Baldwin Jamaica, PA-C  Misc. Devices (WALKER WHEELS) MISC USE AS DIRECTED 06/06/19   Fields, Marga Melnick, MD  mometasone-formoterol [facility]) 200-5 MCG/ACT AERO Inhale 2 puffs into the lungs in the morning and at bedtime. 01/19/21   Freddi Starr, MD  nitroGLYCERIN (NITROSTAT) 0.4 MG SL tablet Place 1 tablet (0.4 mg total) under the tongue every 5 (five) minutes as needed for chest pain.  01/26/21 07/11/21  Elouise Munroe, MD  Ostomy Supplies (ACTIVE LIFE 1-PC DRAIN [phone]) Pouch MISC  05/21/20   [provider]  pantoprazole (PROTONIX) 40 MG tablet TAKE (1) TABLET BY MOUTH TWICE A DAY BEFORE MEALS. (BREAKFAST AND SUPPER) 08/01/20   Mahala Menghini, PA-C  risperiDONE (RISPERDAL) 3 MG tablet Take 3 mg by mouth 2 (two) times daily. 05/12/21   [provider]  sertraline (ZOLOFT) 50 MG tablet Take 50 mg by mouth daily.    [provider]  tamsulosin (FLOMAX) 0.4 MG CAPS capsule Take 0.4 mg by mouth daily. 07/03/20   [provider]  Tiotropium Bromide Monohydrate (SPIRIVA RESPIMAT) 2.5 MCG/ACT AERS Inhale 2 puffs into the lungs daily. 01/19/21   Freddi Starr, MD  traMADol Veatrice [place]) 50 MG tablet 1 tablet as needed 04/13/21   [provider]  traZODone (DESYREL) 100 MG tablet Take 100 mg by mouth at bedtime. 05/12/21   [provider]    Family History    Family History  Problem Relation Age of Onset   Diabetes Mother    Heart disease Mother    Diabetes Father    Heart disease Father    COPD Maternal Grandmother    Colon cancer Neg Hx    He indicated that the status of his mother is unknown. He indicated that the status of his father is unknown. He indicated that the status of his maternal grandmother is unknown. He indicated  that the status of his neg hx is unknown.  Social History    Social History   Socioeconomic History   Marital status: Single    Spouse name: Not on file   Number of children: Not on file   Years of education: Not on file   Highest education level: Not on file  Occupational History   Not on file  Tobacco Use   Smoking status: Every Day    Packs/day: 4.00    Years: 37.00    Pack years: 148.00    Types: Cigarettes   Smokeless tobacco: Never   Tobacco comments:    down to 4ppd 11/14/20  Vaping Use   Vaping Use: Never used  Substance and Sexual Activity   Alcohol use: No   Drug use: No   Sexual activity: Never  Other Topics Concern   Not on file  Social History Narrative   Girlfriend of six years, Rectal Cancer on Hospice (05/2011).   Social Determinants of Health   Financial Resource Strain: Not on file  Food Insecurity: Not on file  Transportation Needs: Not on file  Physical Activity: Not on file  Stress: Not on file  Social Connections: Not on file  Intimate Partner Violence: Not on file     Review of Systems    General:  No chills, fever, night sweats or weight changes.  Cardiovascular:  No chest pain, dyspnea on exertion, edema, orthopnea, palpitations, paroxysmal nocturnal dyspnea. Dermatological: No rash, lesions/masses Respiratory: No cough, dyspnea Urologic: No hematuria, dysuria Abdominal:   No nausea, vomiting, diarrhea, bright red blood per rectum, melena, or hematemesis Neurologic:  No visual changes, wkns, changes in mental status. All other systems reviewed and are otherwise negative except as noted above.  Physical Exam    VS:  There were no vitals taken for this visit. , BMI There is no height or weight on file to calculate BMI. GEN: Well nourished, well developed, in no acute distress. HEENT: normal. Neck: Supple, no JVD, carotid bruits, or masses.  Cardiac: RRR, no murmurs, rubs, or gallops. No clubbing, cyanosis, edema.  Radials/DP/PT 2+ and  equal bilaterally.  Respiratory:  Respirations regular and unlabored, clear to auscultation bilaterally. GI: Soft, nontender, nondistended, BS + x 4. MS: no deformity or atrophy. Skin: warm and dry, no rash. Neuro:  Strength and sensation are intact. Psych: Normal affect.  Accessory Clinical Findings    Recent Labs: 09/15/2021: ALT 25; BUN 5; Creatinine, Ser 0.85; Hemoglobin 13.9; Platelets 88; Potassium 4.5; Sodium 138   Recent Lipid Panel No results found for: CHOL, TRIG, HDL, CHOLHDL, VLDL, LDLCALC, LDLDIRECT  ECG personally reviewed by me today- *** - No acute changes  Assessment & Plan   1.  ***   Jossie Ng. Avarose Mervine NP-C    12/14/2021, 7:09 AM [employer] [address] Office [phone] Fax [phone]  Notice: This dictation was prepared with Dragon dictation along with smaller phrase technology. Any transcriptional errors that result from this process are unintentional and may not be corrected upon review.  I spent***minutes examining this patient, reviewing medications, and using patient centered shared decision making involving her cardiac care.  Prior to her visit I spent greater than 20 minutes reviewing her past medical history,  medications, and prior cardiac tests.

## 2021-12-17 ENCOUNTER — Other Ambulatory Visit: Payer: Self-pay

## 2021-12-17 ENCOUNTER — Ambulatory Visit (INDEPENDENT_AMBULATORY_CARE_PROVIDER_SITE_OTHER): Payer: Medicaid Other | Admitting: General Practice

## 2021-12-17 ENCOUNTER — Encounter: Payer: Self-pay | Admitting: General Practice

## 2021-12-17 VITALS — BP 112/64 | HR 60 | Ht 66.0 in | Wt 220.0 lb

## 2021-12-17 DIAGNOSIS — R002 Palpitations: Secondary | ICD-10-CM | POA: Diagnosis not present

## 2021-12-17 DIAGNOSIS — E785 Hyperlipidemia, unspecified: Secondary | ICD-10-CM | POA: Diagnosis not present

## 2021-12-17 DIAGNOSIS — I471 Supraventricular tachycardia: Secondary | ICD-10-CM

## 2021-12-17 DIAGNOSIS — R072 Precordial pain: Secondary | ICD-10-CM | POA: Diagnosis not present

## 2021-12-17 NOTE — Patient Instructions (Signed)
Medication Instructions:  ?The current medical regimen is effective;  continue present plan and medications as directed. Please refer to the Current Medication list given to you today.  ?*If you need a refill on your cardiac medications before your next appointment, please call your pharmacy* ? ?Lab Work: ?FASTING LIPID AND LFT NEXT WEEK COME IN BETWEEN 8-9AM ?If you have labs (blood work) drawn today and your tests are completely normal, you will receive your results only by:  [place] (if you have MyChart) OR A paper copy in the mail.  If you have any lab test that is abnormal or we need to change your treatment, we will call you to review the results. You may go to any [facility] that is convenient for you however, we do have a lab in our office that is able to assist you. You DO NOT need an appointment for our lab. The lab is open 8:00am and closes at 4:00pm. Lunch 12:45 - 1:45pm. ? ?Special Instructions ?PLEASE READ AND FOLLOW SALTY 6-ATTACHED-1,800 mg daily ? ?PLEASE INCREASE PHYSICAL ACTIVITY AS TOLERATED ? ?Follow-Up: ?Your next appointment:  6-9 month(s)  (September-December) In Person with Elouise Munroe, MD   ? ?Please call our office 2 months in advance (July-august-September) to schedule this appointment 1 ? ?At [facility], you and your health needs are our priority.  As part of our continuing mission to provide you with exceptional heart care, we have created designated Provider Care Teams.  These Care Teams include your primary Cardiologist (physician) and Advanced Practice Providers (APPs -  Physician Assistants and Nurse Practitioners) who all work together to provide you with the care you need, when you need it. ? ?We recommend signing up for the patient portal called "MyChart".  Sign up information is provided on this After Visit Summary.  MyChart is used to connect with patients for Virtual Visits (Telemedicine).  Patients are able to view lab/test results, encounter notes, upcoming  appointments, etc.  Non-urgent messages can be sent to your provider as well.   ?To learn more about what you can do with MyChart, go to [email or web address].   ? ? ? ? ?  ? ? ? ? ?

## 2021-12-22 LAB — HEPATIC FUNCTION PANEL
ALT: 31 IU/L (ref 0–44)
AST: 48 IU/L — ABNORMAL HIGH (ref 0–40)
Albumin: 3.4 g/dL — ABNORMAL LOW (ref 3.8–4.9)
Alkaline Phosphatase: 131 IU/L — ABNORMAL HIGH (ref 44–121)
Bilirubin Total: 0.8 mg/dL (ref 0.0–1.2)
Bilirubin, Direct: 0.32 mg/dL (ref 0.00–0.40)
Total Protein: 6 g/dL (ref 6.0–8.5)

## 2021-12-22 LAB — LIPID PANEL
Chol/HDL Ratio: 2.1 ratio (ref 0.0–5.0)
Cholesterol, Total: 90 mg/dL — ABNORMAL LOW (ref 100–199)
HDL: 43 mg/dL (ref >39)
LDL Chol Calc (NIH): 33 mg/dL (ref 0–99)
Triglycerides: 59 mg/dL (ref 0–149)
VLDL Cholesterol Cal: 14 mg/dL (ref 5–40)

## 2021-12-29 ENCOUNTER — Inpatient Hospital Stay (HOSPITAL_COMMUNITY): Payer: Medicaid Other | Attending: Hematology

## 2021-12-29 DIAGNOSIS — Z7901 Long term (current) use of anticoagulants: Secondary | ICD-10-CM | POA: Insufficient documentation

## 2021-12-29 DIAGNOSIS — E611 Iron deficiency: Secondary | ICD-10-CM | POA: Diagnosis not present

## 2021-12-29 DIAGNOSIS — D61818 Other pancytopenia: Secondary | ICD-10-CM | POA: Insufficient documentation

## 2021-12-29 DIAGNOSIS — D696 Thrombocytopenia, unspecified: Secondary | ICD-10-CM | POA: Insufficient documentation

## 2021-12-29 DIAGNOSIS — Z86718 Personal history of other venous thrombosis and embolism: Secondary | ICD-10-CM | POA: Insufficient documentation

## 2021-12-29 DIAGNOSIS — E538 Deficiency of other specified B group vitamins: Secondary | ICD-10-CM

## 2021-12-29 LAB — CBC WITH DIFFERENTIAL/PLATELET
Abs Immature Granulocytes: 0.01 10*3/uL (ref 0.00–0.07)
Basophils Absolute: 0 10*3/uL (ref 0.0–0.1)
Basophils Relative: 1 %
Eosinophils Absolute: 0.1 10*3/uL (ref 0.0–0.5)
Eosinophils Relative: 3 %
HCT: 39.2 % (ref 39.0–52.0)
Hemoglobin: 13.1 g/dL (ref 13.0–17.0)
Immature Granulocytes: 0 %
Lymphocytes Relative: 34 %
Lymphs Abs: 1.1 10*3/uL (ref 0.7–4.0)
MCH: 33.6 pg (ref 26.0–34.0)
MCHC: 33.4 g/dL (ref 30.0–36.0)
MCV: 100.5 fL — ABNORMAL HIGH (ref 80.0–100.0)
Monocytes Absolute: 0.4 10*3/uL (ref 0.1–1.0)
Monocytes Relative: 11 %
Neutro Abs: 1.7 10*3/uL (ref 1.7–7.7)
Neutrophils Relative %: 51 %
Platelets: 63 10*3/uL — ABNORMAL LOW (ref 150–400)
RBC: 3.9 MIL/uL — ABNORMAL LOW (ref 4.22–5.81)
RDW: 14.6 % (ref 11.5–15.5)
WBC: 3.3 10*3/uL — ABNORMAL LOW (ref 4.0–10.5)
nRBC: 0 % (ref 0.0–0.2)

## 2021-12-29 LAB — FERRITIN: Ferritin: 98 ng/mL (ref 24–336)

## 2021-12-29 LAB — FOLATE: Folate: 11.8 ng/mL (ref >5.9)

## 2021-12-29 LAB — IRON AND TIBC
Iron: 105 ug/dL (ref 45–182)
Saturation Ratios: 37 % (ref 17.9–39.5)
TIBC: 285 ug/dL (ref 250–450)
UIBC: 180 ug/dL

## 2021-12-29 LAB — VITAMIN B12: Vitamin B-12: 1594 pg/mL — ABNORMAL HIGH (ref 180–914)

## 2022-01-01 LAB — METHYLMALONIC ACID, SERUM: Methylmalonic Acid, Quantitative: 74 nmol/L (ref 0–378)

## 2022-01-04 NOTE — Progress Notes (Signed)
? ?[facility] ?[address] ?[place], [place] [address] ? ? ?CLINIC:  ?Medical Oncology/Hematology ? ?PCP:  ?[name], MD ?[address] ?[place] [address] ?[phone] ? ? ?REASON FOR VISIT:  ?Follow-up for pancytopenia, iron deficiency, recurrent DVTs ? ?CURRENT THERAPY: Eliquis ? ?INTERVAL HISTORY:  ?[name] 56 y.o. male returns for routine follow-up of pancytopenia, iron deficiency, and recurrent DVTs.  He was last seen by [name] Abernethy PA-C on 10/07/2021. ? ?At today's visit, he reports feeling fair.   ?He continues to take Eliquis without any major issues.  He reports easy bruising but denies any major bleeding events.  He denies any hematochezia or melena, no blood in his ostomy bag.   ?He has baseline pain and edema of bilateral lower extremities. ?He denies any chest pain, dyspnea on exertion, or hemoptysis. ?He has some shortness of breath, but this only occurs when he is smoking cigarettes, so he is trying to quit.  He is currently down to 6 cigarettes daily. ? ?He denies any fever, chills, night sweats. ?He reports low appetite that will "go up and down."  He is maintaining a stable weight. ?He has not noticed any new lumps or bumps. ? ?He continues to have frequent urinary tract infections.  He is not currently on any antibiotics. ? ?He is taking his iron supplement daily.  He is taking twice the recommended dose of vitamin B12, taking his 1000 mcg tablet twice daily instead of once daily. ? ?He is not currently taking any medications for his Crohn's disease, but is seeing the specialists at [facility]. ? ?He has little energy and 65% appetite. He endorses that he is maintaining a stable weight. ? ? ?REVIEW OF SYSTEMS:  ?Review of Systems  ?Constitutional:  Positive for fatigue. Negative for appetite change, chills, diaphoresis, fever and unexpected weight change.  ?HENT:   Negative for lump/mass and nosebleeds.   ?Eyes:  Negative for eye problems.  ?Respiratory:  Positive for  cough and shortness of breath. Negative for hemoptysis.   ?Cardiovascular:  Positive for leg swelling. Negative for chest pain and palpitations.  ?Gastrointestinal:  Positive for diarrhea. Negative for abdominal pain, blood in stool, constipation, nausea and vomiting.  ?Genitourinary:  Negative for hematuria.   ?     Frequent UTIs  ?Skin: Negative.   ?Neurological:  Positive for headaches. Negative for dizziness and light-headedness.  ?Hematological:  Does not bruise/bleed easily.   ? ? ?PAST MEDICAL/SURGICAL HISTORY:  ?Past Medical History:  ?Diagnosis Date  ? Arthritis   ? Asthma   ? 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 right, s/p replacement, due to prednisone  ? Bipolar disorder ([place])   ? Colostomy in place [facility])   ? Crohn's colitis ([place])   ? s/p total colectomy with ileostomy in 2009  ? Crohn's disease of small intestine [facility]) Sept 2012  ? ileoscopy: multiple ulcers likely secondary to Crohn's  ? DEEP VENOUS THROMBOPHLEBITIS, HX OF 02/11/2009  ? Qualifier: Diagnosis of  By: Zeb Comfort    ? Depression   ? DVT (deep venous thrombosis) ([place]) 2009  ? right upper extremity due to PICC  ? Enterococcus UTI 2009  ? GERD (gastroesophageal reflux disease)   ? Hernia   ? Hyperlipidemia   ? Insomnia   ? Migraine headache   ? Nystagmus   ? PULMONARY EMBOLISM, HX OF 02/11/2009  ? Qualifier: Diagnosis of  By: Zeb Comfort    ? S/P endoscopy Sept  2012  ? mild gastritis, small hiatal hernia, no ulcers  ? Schizophrenia ([place])   ? Schizophrenia, acute ([place])   ? ?Past Surgical History:  ?Procedure Laterality Date  ? APPENDECTOMY    ? ESOPHAGOGASTRODUODENOSCOPY  07/2010  ? gastritis,  bx neg for H.Pylori  ? ESOPHAGOGASTRODUODENOSCOPY N/A 01/08/2014  ? SLF:NO SOURCE FOR ODYNOPHAGIA IDENTIFIED/Multiple small ulcers in the gastric antrum  ? EYE SURGERY    ? ILEOSCOPY  06/29/2011  ? SLF: ulcers,multiple/small HH/mild gastritis  ? KIDNEY SURGERY    ? LAPAROTOMY N/A 06/05/2013  ? Procedure: EXPLORATORY LAPAROTOMY;  Surgeon: Donato Heinz, MD;  Location: [facility];  Service: General;  Laterality: N/A;  ? LYSIS OF ADHESION N/A 06/05/2013  ? Procedure: LYSIS OF ADHESIONS;  Surgeon: Donato Heinz, MD;  Location: [facility];  Service: General;  Laterality: N/A;  ? small bowel capsule study  08/2010  ? few tiny nonbleeding erosions/ulcers ?secondary to relafen or Crohn's. Entire small bowel not seen.   ? TOTAL COLECTOMY  2009  ? with ileostomy at [facility] for refractory disease   ? TOTAL HIP ARTHROPLASTY    ? right, due to avascular necrosis from chronic prednisone  ? TOTAL SHOULDER REPLACEMENT    ? bilateral  ? TRANSURETHRAL RESECTION OF PROSTATE N/A 12/03/2019  ? Procedure: TRANSURETHRAL RESECTION OF THE PROSTATE (TURP);  Surgeon: Cleon Gustin, MD;  Location: [facility];  Service: Urology;  Laterality: N/A;  ? ? ? ?SOCIAL HISTORY:  ?Social History  ? ?Socioeconomic History  ? Marital status: Single  ?  Spouse name: Not on file  ? Number of children: Not on file  ? Years of education: Not on file  ? Highest education level: Not on file  ?Occupational History  ? Not on file  ?Tobacco Use  ? Smoking status: Every Day  ?  Packs/day: 4.00  ?  Years: 37.00  ?  Pack years: 148.00  ?  Types: Cigarettes  ? Smokeless tobacco: Never  ? Tobacco comments:  ?  down to 4ppd 11/14/20  ?Vaping Use  ? Vaping Use: Never used  ?Substance and Sexual Activity  ? Alcohol use: No  ? Drug use: No  ? Sexual activity: Never  ?Other Topics Concern  ? Not on file  ?Social History Narrative  ? Girlfriend of six years, Rectal Cancer on Hospice (05/2011).  ? ?Social Determinants of Health  ? ?Financial Resource Strain: Not on file  ?Food Insecurity: Not on file  ?Transportation Needs: Not on file  ?Physical Activity: Not on file  ?Stress: Not on file  ?Social Connections: Not on file  ?Intimate Partner Violence: Not on file  ? ? ?FAMILY HISTORY:  ?Family History  ?Problem Relation Age of Onset  ? Diabetes Mother   ? Heart disease Mother   ? Diabetes Father   ? Heart disease Father   ?  COPD Maternal Grandmother   ? Colon cancer Neg Hx   ? ? ?CURRENT MEDICATIONS:  ?Outpatient Encounter Medications as of 01/05/2022  ?Medication Sig  ? ACCU-CHEK AVIVA PLUS test strip SMARTSIG:Via Meter  ? albuterol (PROVENTIL) (2.5 MG/3ML) 0.083% nebulizer solution Take 2.5 mg by nebulization every 6 (six) hours as needed for wheezing or shortness of breath.  ? albuterol (VENTOLIN HFA) 108 (90 Base) MCG/ACT inhaler Inhale 2 puffs into the lungs every 6 (six) hours as needed for wheezing or shortness of breath.  ? atorvastatin (LIPITOR) 80 MG tablet Take 80 mg by mouth daily.  ? azelastine (ASTELIN) 0.1 %  nasal spray Place 2 sprays into both nostrils 2 (two) times daily.  ? baclofen (LIORESAL) 10 MG tablet Take 10 mg by mouth daily.  ? benztropine (COGENTIN) 0.5 MG tablet Take 0.5 mg by mouth daily.   ? CALCIUM 600/VITAMIN D3 600-800 MG-UNIT TABS TAKE (1) TABLET BY MOUTH THREE TIMES DAILY AFTER MEALS.  ? cyanocobalamin 1000 MCG tablet Take 1 tablet by mouth daily.  ? dicyclomine (BENTYL) 10 MG capsule TAKE 1 CAPSULE BY MOUTH BEFORE MEALS AND AT BEDTIME AS NEEDED FOR ABDOMINAL CRAMPING. *HOLD FOR CONSTIPATION*  ? divalproex (DEPAKOTE) 250 MG DR tablet Take by mouth at bedtime.  ? divalproex (DEPAKOTE) 500 MG 24 hr tablet Take 1,000 mg by mouth every evening.  ? docusate sodium (COLACE) 100 MG capsule Take 1 capsule (100 mg total) by mouth 2 (two) times daily.  ? ELIQUIS 5 MG TABS tablet Take 5 mg by mouth 2 (two) times daily.  ? ferrous sulfate 325 (65 FE) MG EC tablet Take 1 tablet (325 mg total) by mouth daily with breakfast.  ? FLOVENT HFA 110 MCG/ACT inhaler Inhale 2 puffs into the lungs 2 (two) times daily.  ? folic acid (FOLVITE) 1 MG tablet Take 1 tablet by mouth daily.  ? furosemide (LASIX) 40 MG tablet Take 40 mg by mouth daily.  ? gabapentin (NEURONTIN) 300 MG capsule Take 900 mg by mouth 3 (three) times daily.  ? hydroxychloroquine (PLAQUENIL) 200 MG tablet Take 1 tablet by mouth 2 (two) times daily.  ?  meloxicam (MOBIC) 7.5 MG tablet Take 7.5 mg by mouth daily.  ? metoprolol succinate (TOPROL-XL) 25 MG 24 hr tablet Take 1 tablet (25 mg total) by mouth in the morning and at bedtime. Take with or immediately

## 2022-01-05 ENCOUNTER — Inpatient Hospital Stay (HOSPITAL_COMMUNITY): Payer: Medicaid Other | Attending: Hematology | Admitting: Physician Assistant

## 2022-01-05 VITALS — BP 106/52 | HR 64 | Temp 98.2°F | Resp 20 | Ht 63.98 in | Wt 223.1 lb

## 2022-01-05 DIAGNOSIS — Z87440 Personal history of urinary (tract) infections: Secondary | ICD-10-CM | POA: Insufficient documentation

## 2022-01-05 DIAGNOSIS — K5090 Crohn's disease, unspecified, without complications: Secondary | ICD-10-CM | POA: Diagnosis not present

## 2022-01-05 DIAGNOSIS — D72819 Decreased white blood cell count, unspecified: Secondary | ICD-10-CM

## 2022-01-05 DIAGNOSIS — Z86718 Personal history of other venous thrombosis and embolism: Secondary | ICD-10-CM | POA: Diagnosis not present

## 2022-01-05 DIAGNOSIS — J45909 Unspecified asthma, uncomplicated: Secondary | ICD-10-CM | POA: Diagnosis not present

## 2022-01-05 DIAGNOSIS — F319 Bipolar disorder, unspecified: Secondary | ICD-10-CM | POA: Insufficient documentation

## 2022-01-05 DIAGNOSIS — Z7901 Long term (current) use of anticoagulants: Secondary | ICD-10-CM | POA: Diagnosis not present

## 2022-01-05 DIAGNOSIS — M069 Rheumatoid arthritis, unspecified: Secondary | ICD-10-CM | POA: Insufficient documentation

## 2022-01-05 DIAGNOSIS — E538 Deficiency of other specified B group vitamins: Secondary | ICD-10-CM

## 2022-01-05 DIAGNOSIS — I82409 Acute embolism and thrombosis of unspecified deep veins of unspecified lower extremity: Secondary | ICD-10-CM | POA: Diagnosis not present

## 2022-01-05 DIAGNOSIS — D696 Thrombocytopenia, unspecified: Secondary | ICD-10-CM | POA: Insufficient documentation

## 2022-01-05 DIAGNOSIS — D61818 Other pancytopenia: Secondary | ICD-10-CM | POA: Insufficient documentation

## 2022-01-05 DIAGNOSIS — F17210 Nicotine dependence, cigarettes, uncomplicated: Secondary | ICD-10-CM | POA: Insufficient documentation

## 2022-01-05 DIAGNOSIS — E611 Iron deficiency: Secondary | ICD-10-CM | POA: Diagnosis not present

## 2022-01-05 NOTE — Patient Instructions (Signed)
[place] at [facility] ?Discharge Instructions ? ?You were seen today by Tarri Abernethy PA-C for your low blood counts.  Since we still do not have a definitive cause of your low platelets and low white blood cells, we will check a BONE MARROW BIOPSY to see if you have any underlying bone marrow disorders causing your low blood counts.  ? ?MEDICATIONS: ?- Continue daily iron tablet ?- Continue B12 tablet, ONCE daily ?- Continue Eliquis ? ?FOLLOW-UP APPOINTMENT: Office visit in 4 to 6 weeks (about 2 weeks after bone marrow biopsy) ? ? ?Thank you for choosing [place] at [facility] to provide your oncology and hematology care.  To afford each patient quality time with our provider, please arrive at least 15 minutes before your scheduled appointment time.  ? ?If you have a lab appointment with the [facility] please come in thru the Main Entrance and check in at the main information desk. ? ?You need to re-schedule your appointment should you arrive 10 or more minutes late.  We strive to give you quality time with our providers, and arriving late affects you and other patients whose appointments are after yours.  Also, if you no show three or more times for appointments you may be dismissed from the clinic at the providers discretion.     ?Again, thank you for choosing [facility].  Our hope is that these requests will decrease the amount of time that you wait before being seen by our physicians.       ?_____________________________________________________________ ? ?Should you have questions after your visit to [facility], please contact our office at [phone] and follow the prompts.  Our office hours are 8:00 a.m. and 4:30 p.m. Monday - Friday.  Please note that voicemails left after 4:00 p.m. may not be returned until the following business day.  We are closed weekends and major holidays.  You do have access to a nurse 24-7,  just call the main number to the clinic [phone] and do not press any options, hold on the line and a nurse will answer the phone.   ? ?For prescription refill requests, have your pharmacy contact our office and allow 72 hours.   ? ?Due to Covid, you will need to wear a mask upon entering the hospital. If you do not have a mask, a mask will be given to you at the Main Entrance upon arrival. For doctor visits, patients may have 1 support person age 37 or older with them. For treatment visits, patients can not have anyone with them due to social distancing guidelines and our immunocompromised population.  ? ? ? ?

## 2022-01-20 ENCOUNTER — Other Ambulatory Visit: Payer: Self-pay | Admitting: Student

## 2022-01-20 ENCOUNTER — Ambulatory Visit (INDEPENDENT_AMBULATORY_CARE_PROVIDER_SITE_OTHER): Payer: Medicaid Other | Admitting: Urology

## 2022-01-20 ENCOUNTER — Encounter: Payer: Self-pay | Admitting: Urology

## 2022-01-20 VITALS — BP 111/73 | HR 58

## 2022-01-20 DIAGNOSIS — R300 Dysuria: Secondary | ICD-10-CM | POA: Diagnosis not present

## 2022-01-20 DIAGNOSIS — N390 Urinary tract infection, site not specified: Secondary | ICD-10-CM

## 2022-01-20 DIAGNOSIS — Z87440 Personal history of urinary (tract) infections: Secondary | ICD-10-CM

## 2022-01-20 DIAGNOSIS — R350 Frequency of micturition: Secondary | ICD-10-CM

## 2022-01-20 DIAGNOSIS — R3915 Urgency of urination: Secondary | ICD-10-CM

## 2022-01-20 DIAGNOSIS — R8290 Unspecified abnormal findings in urine: Secondary | ICD-10-CM

## 2022-01-20 LAB — URINALYSIS, ROUTINE W REFLEX MICROSCOPIC
Bilirubin, UA: NEGATIVE
Glucose, UA: NEGATIVE
Ketones, UA: NEGATIVE
Nitrite, UA: POSITIVE — AB
Specific Gravity, UA: >1.03 — ABNORMAL HIGH (ref 1.005–1.030)
Urobilinogen, Ur: 1 mg/dL (ref 0.2–1.0)
pH, UA: 6 (ref 5.0–7.5)

## 2022-01-20 LAB — MICROSCOPIC EXAMINATION: Renal Epithel, UA: NONE SEEN /hpf

## 2022-01-20 MED ORDER — DOXYCYCLINE HYCLATE 100 MG PO CAPS: 100.0000 mg | ORAL_CAPSULE | Freq: Two times a day (BID) | ORAL | 0 refills | Status: DC

## 2022-01-20 NOTE — Patient Instructions (Signed)
Urinary Tract Infection, Adult A urinary tract infection (UTI) is an infection of any part of the urinary tract. The urinary tract includes: The kidneys. The ureters. The bladder. The urethra. These organs make, store, and get rid of pee (urine) in the body. What are the causes? This infection is caused by germs (bacteria) in your genital area. These germs grow and cause swelling (inflammation) of your urinary tract. What increases the risk? The following factors may make you more likely to develop this condition: Using a small, thin tube (catheter) to drain pee. Not being able to control when you pee or poop (incontinence). Being male. If you are male, these things can increase the risk: Using these methods to prevent pregnancy: A medicine that kills sperm (spermicide). A device that blocks sperm (diaphragm). Having low levels of a male hormone (estrogen). Being pregnant. You are more likely to develop this condition if: You have genes that add to your risk. You are sexually active. You take antibiotic medicines. You have trouble peeing because of: A prostate that is bigger than normal, if you are male. A blockage in the part of your body that drains pee from the bladder. A kidney stone. A nerve condition that affects your bladder. Not getting enough to drink. Not peeing often enough. You have other conditions, such as: Diabetes. A weak disease-fighting system (immune system). Sickle cell disease. Gout. Injury of the spine. What are the signs or symptoms? Symptoms of this condition include: Needing to pee right away. Peeing small amounts often. Pain or burning when peeing. Blood in the pee. Pee that smells bad or not like normal. Trouble peeing. Pee that is cloudy. Fluid coming from the vagina, if you are male. Pain in the belly or lower back. Other symptoms include: Vomiting. Not feeling hungry. Feeling mixed up (confused). This may be the first symptom in  older adults. Being tired and grouchy (irritable). A fever. Watery poop (diarrhea). How is this treated? Taking antibiotic medicine. Taking other medicines. Drinking enough water. In some cases, you may need to see a specialist. Follow these instructions at home:  Medicines Take over-the-counter and prescription medicines only as told by your doctor. If you were prescribed an antibiotic medicine, take it as told by your doctor. Do not stop taking it even if you start to feel better. General instructions Make sure you: Pee until your bladder is empty. Do not hold pee for a long time. Empty your bladder after sex. Wipe from front to back after peeing or pooping if you are a male. Use each tissue one time when you wipe. Drink enough fluid to keep your pee pale yellow. Keep all follow-up visits. Contact a doctor if: You do not get better after 1-2 days. Your symptoms go away and then come back. Get help right away if: You have very bad back pain. You have very bad pain in your lower belly. You have a fever. You have chills. You feeling like you will vomit or you vomit. Summary A urinary tract infection (UTI) is an infection of any part of the urinary tract. This condition is caused by germs in your genital area. There are many risk factors for a UTI. Treatment includes antibiotic medicines. Drink enough fluid to keep your pee pale yellow. This information is not intended to replace advice given to you by your health care provider. Make sure you discuss any questions you have with your health care provider. Document Revised: 05/02/2020 Document Reviewed: 05/02/2020 Elsevier Patient Education    2023 [employer]  

## 2022-01-20 NOTE — Progress Notes (Signed)
? ?01/20/2022 ?11:24 AM  ? ?[name] ?22-Apr-1966 ?[id number] ? ?Referring provider: [name], MD ?[address] ?[place],  [place] [address] ? ?Frequent UTI ? ?HPI: ?Mr Fusselman is a 56yo here for cystoscopy today for workup for recurrent UTI. He complaints of new onset urinary frequency, urinary urgency, dysuria and foul smelling urine. UA today is concerning for infections. He denies any gross hematuria. No fevers. No other complaints today ? ? ?PMH: ?Past Medical History:  ?Diagnosis Date  ? Arthritis   ? Asthma   ? 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[place])   ? right, s/p replacement, due to prednisone  ? Bipolar disorder ([place])   ? Colostomy in place [facility])   ? Crohn's colitis ([place])   ? s/p total colectomy with ileostomy in 2009  ? Crohn's disease of small intestine [facility]) Sept 2012  ? ileoscopy: multiple ulcers likely secondary to Crohn's  ? DEEP VENOUS THROMBOPHLEBITIS, HX OF 02/11/2009  ? Qualifier: Diagnosis of  By: Zeb Comfort    ? Depression   ? DVT (deep venous thrombosis) ([place]) 2009  ? right upper extremity due to PICC  ? Enterococcus UTI 2009  ? GERD (gastroesophageal reflux disease)   ? Hernia   ? Hyperlipidemia   ? Insomnia   ? Migraine headache   ? Nystagmus   ? PULMONARY EMBOLISM, HX OF 02/11/2009  ? Qualifier: Diagnosis of  By: Zeb Comfort    ? S/P endoscopy Sept 2012  ? mild gastritis, small hiatal hernia, no ulcers  ? Schizophrenia ([place])   ? Schizophrenia, acute ([place])   ? ? ?Surgical History: ?Past Surgical History:  ?Procedure Laterality Date  ? APPENDECTOMY    ? ESOPHAGOGASTRODUODENOSCOPY  07/2010  ? gastritis,  bx neg for H.Pylori  ? ESOPHAGOGASTRODUODENOSCOPY N/A 01/08/2014  ? SLF:NO SOURCE FOR ODYNOPHAGIA IDENTIFIED/Multiple small ulcers in the gastric antrum  ? EYE SURGERY    ? ILEOSCOPY  06/29/2011  ? SLF: ulcers,multiple/small HH/mild gastritis  ? KIDNEY SURGERY    ? LAPAROTOMY N/A 06/05/2013  ? Procedure: EXPLORATORY LAPAROTOMY;  Surgeon: Donato Heinz, MD;  Location: [facility];   Service: General;  Laterality: N/A;  ? LYSIS OF ADHESION N/A 06/05/2013  ? Procedure: LYSIS OF ADHESIONS;  Surgeon: Donato Heinz, MD;  Location: [facility];  Service: General;  Laterality: N/A;  ? small bowel capsule study  08/2010  ? few tiny nonbleeding erosions/ulcers ?secondary to relafen or Crohn's. Entire small bowel not seen.   ? TOTAL COLECTOMY  2009  ? with ileostomy at [facility] for refractory disease   ? TOTAL HIP ARTHROPLASTY    ? right, due to avascular necrosis from chronic prednisone  ? TOTAL SHOULDER REPLACEMENT    ? bilateral  ? TRANSURETHRAL RESECTION OF PROSTATE N/A 12/03/2019  ? Procedure: TRANSURETHRAL RESECTION OF THE PROSTATE (TURP);  Surgeon: Cleon Gustin, MD;  Location: [facility];  Service: Urology;  Laterality: N/A;  ? ? ?Home Medications:  ?Allergies as of 01/20/2022   ? ?   Reactions  ? Penicillins Anaphylaxis  ? Bactrim [sulfamethoxazole-trimethoprim] Other (See Comments)  ? ABD CRAMPS AND DIARRHEA ?Welps/itch  ? Oxycodone-acetaminophen Other (See Comments)  ? Other reaction(s): Swelling  ? Remicade [infliximab] Other (See Comments)  ? COULDN'T BREATHE  ? Aspirin   ? REACTION: unknown reaction  ? Codeine   ? REACTION: unknown reaction  ? Ibuprofen   ? REACTION: unknown reaction  ? Macrobid [nitrofurantoin] Itching  ? Tramadol Nausea Only  ? Other reaction(s):  Nausea  ? Tramadol Hcl   ? REACTION: unknown reaction  ? Vicodin [hydrocodone-acetaminophen] Nausea Only  ? ?  ? ?  ?Medication List  ?  ? ?  ? Accurate as of January 20, 2022 11:24 AM. If you have any questions, ask your nurse or doctor.  ?  ?  ? ?  ? ?Accu-Chek Aviva Plus test strip ?Generic drug: glucose blood ?SMARTSIG:Via Meter ?  ?Active Life 1-Pc Drain 19-64mm Pouch Misc ?  ?albuterol (2.5 MG/3ML) 0.083% nebulizer solution ?Commonly known as: PROVENTIL ?Take 2.5 mg by nebulization every 6 (six) hours as needed for wheezing or shortness of breath. ?  ?albuterol 108 (90 Base) MCG/ACT inhaler ?Commonly known as: VENTOLIN HFA ?Inhale 2  puffs into the lungs every 6 (six) hours as needed for wheezing or shortness of breath. ?  ?atorvastatin 80 MG tablet ?Commonly known as: LIPITOR ?Take 80 mg by mouth daily. ?  ?azelastine 0.1 % nasal spray ?Commonly known as: ASTELIN ?Place 2 sprays into both nostrils 2 (two) times daily. ?  ?baclofen 10 MG tablet ?Commonly known as: LIORESAL ?Take 10 mg by mouth daily. ?  ?benztropine 0.5 MG tablet ?Commonly known as: COGENTIN ?Take 0.5 mg by mouth daily. ?  ?Calcium 600/Vitamin D3 600-20 MG-MCG Tabs ?Generic drug: Calcium Carb-Cholecalciferol ?TAKE (1) TABLET BY MOUTH THREE TIMES DAILY AFTER MEALS. ?  ?cyanocobalamin 1000 MCG tablet ?Take 1 tablet by mouth daily. ?  ?dicyclomine 10 MG capsule ?Commonly known as: BENTYL ?TAKE 1 CAPSULE BY MOUTH BEFORE MEALS AND AT BEDTIME AS NEEDED FOR ABDOMINAL CRAMPING. *HOLD FOR CONSTIPATION* ?  ?divalproex 500 MG 24 hr tablet ?Commonly known as: DEPAKOTE ER ?Take 1,000 mg by mouth every evening. ?  ?divalproex 250 MG DR tablet ?Commonly known as: DEPAKOTE ?Take by mouth at bedtime. ?  ?docusate sodium 100 MG capsule ?Commonly known as: COLACE ?Take 1 capsule (100 mg total) by mouth 2 (two) times daily. ?  ?doxycycline 100 MG capsule ?Commonly known as: VIBRAMYCIN ?Take 1 capsule (100 mg total) by mouth every 12 (twelve) hours. ?Started by: [name], MD ?  ?Dulera 200-5 MCG/ACT Aero ?Generic drug: mometasone-formoterol ?Inhale 2 puffs into the lungs in the morning and at bedtime. ?  ?Eliquis 5 MG Tabs tablet ?Generic drug: apixaban ?Take 5 mg by mouth 2 (two) times daily. ?  ?ferrous sulfate 325 (65 FE) MG EC tablet ?Take 1 tablet (325 mg total) by mouth daily with breakfast. ?  ?Flovent HFA 110 MCG/ACT inhaler ?Generic drug: fluticasone ?Inhale 2 puffs into the lungs 2 (two) times daily. ?  ?folic acid 1 MG tablet ?Commonly known as: FOLVITE ?Take 1 tablet by mouth daily. ?  ?furosemide 40 MG tablet ?Commonly known as: LASIX ?Take 40 mg by mouth daily. ?  ?gabapentin  300 MG capsule ?Commonly known as: NEURONTIN ?Take 900 mg by mouth 3 (three) times daily. ?  ?hydroxychloroquine 200 MG tablet ?Commonly known as: PLAQUENIL ?Take 1 tablet by mouth 2 (two) times daily. ?  ?meloxicam 7.5 MG tablet ?Commonly known as: MOBIC ?Take 7.5 mg by mouth daily. ?  ?metoprolol succinate 25 MG 24 hr tablet ?Commonly known as: TOPROL-XL ?Take 1 tablet (25 mg total) by mouth in the morning and at bedtime. Take with or immediately following a meal. ?  ?nitroGLYCERIN 0.4 MG SL tablet ?Commonly known as: NITROSTAT ?Place 1 tablet (0.4 mg total) under the tongue every 5 (five) minutes as needed for chest pain. ?  ?pantoprazole 40 MG tablet ?Commonly known as: PROTONIX ?  TAKE (1) TABLET BY MOUTH TWICE A DAY BEFORE MEALS. (BREAKFAST AND SUPPER) ?  ?predniSONE 20 MG tablet ?Commonly known as: DELTASONE ?Take by mouth. ?  ?risperiDONE 3 MG tablet ?Commonly known as: RISPERDAL ?Take 3 mg by mouth 2 (two) times daily. ?  ?sertraline 50 MG tablet ?Commonly known as: ZOLOFT ?Take 50 mg by mouth daily. ?  ?Spiriva Respimat 2.5 MCG/ACT Aers ?Generic drug: Tiotropium Bromide Monohydrate ?Inhale 2 puffs into the lungs daily. ?  ?tamsulosin 0.4 MG Caps capsule ?Commonly known as: FLOMAX ?Take 0.4 mg by mouth daily. ?  ?traMADol 50 MG tablet ?Commonly known as: ULTRAM ?1 tablet as needed ?  ?traZODone 100 MG tablet ?Commonly known as: DESYREL ?Take 100 mg by mouth at bedtime. ?  ?Dealer ?USE AS DIRECTED ?  ? ?  ? ? ?Allergies:  ?Allergies  ?Allergen Reactions  ? Penicillins Anaphylaxis  ? Bactrim [Sulfamethoxazole-Trimethoprim] Other (See Comments)  ?  ABD CRAMPS AND DIARRHEA ?Welps/itch  ? Oxycodone-Acetaminophen Other (See Comments)  ?  Other reaction(s): Swelling  ? Remicade [Infliximab] Other (See Comments)  ?  COULDN'T BREATHE  ? Aspirin   ?  REACTION: unknown reaction  ? Codeine   ?  REACTION: unknown reaction  ? Ibuprofen   ?  REACTION: unknown reaction  ? Macrobid [Nitrofurantoin] Itching  ?  Tramadol Nausea Only  ?  Other reaction(s): Nausea  ? Tramadol Hcl   ?  REACTION: unknown reaction  ? Vicodin [Hydrocodone-Acetaminophen] Nausea Only  ? ? ?Family History: ?Family History  ?Problem Relation Age

## 2022-01-21 ENCOUNTER — Ambulatory Visit (HOSPITAL_COMMUNITY)
Admission: RE | Admit: 2022-01-21 | Discharge: 2022-01-21 | Disposition: A | Discharge status: Home / Self Care | Payer: Medicaid Other | Source: Ambulatory Visit | Attending: Physician Assistant | Admitting: Physician Assistant

## 2022-01-21 ENCOUNTER — Encounter (HOSPITAL_COMMUNITY): Payer: Self-pay

## 2022-01-21 ENCOUNTER — Other Ambulatory Visit: Payer: Self-pay

## 2022-01-21 DIAGNOSIS — Z86711 Personal history of pulmonary embolism: Secondary | ICD-10-CM | POA: Insufficient documentation

## 2022-01-21 DIAGNOSIS — J45909 Unspecified asthma, uncomplicated: Secondary | ICD-10-CM | POA: Insufficient documentation

## 2022-01-21 DIAGNOSIS — D72819 Decreased white blood cell count, unspecified: Secondary | ICD-10-CM | POA: Insufficient documentation

## 2022-01-21 DIAGNOSIS — D696 Thrombocytopenia, unspecified: Secondary | ICD-10-CM | POA: Insufficient documentation

## 2022-01-21 DIAGNOSIS — F17210 Nicotine dependence, cigarettes, uncomplicated: Secondary | ICD-10-CM | POA: Diagnosis not present

## 2022-01-21 DIAGNOSIS — E785 Hyperlipidemia, unspecified: Secondary | ICD-10-CM | POA: Diagnosis not present

## 2022-01-21 DIAGNOSIS — Z86718 Personal history of other venous thrombosis and embolism: Secondary | ICD-10-CM | POA: Diagnosis not present

## 2022-01-21 DIAGNOSIS — K219 Gastro-esophageal reflux disease without esophagitis: Secondary | ICD-10-CM | POA: Insufficient documentation

## 2022-01-21 DIAGNOSIS — F319 Bipolar disorder, unspecified: Secondary | ICD-10-CM | POA: Diagnosis not present

## 2022-01-21 DIAGNOSIS — M1990 Unspecified osteoarthritis, unspecified site: Secondary | ICD-10-CM | POA: Diagnosis not present

## 2022-01-21 DIAGNOSIS — K5010 Crohn's disease of large intestine without complications: Secondary | ICD-10-CM | POA: Diagnosis not present

## 2022-01-21 LAB — CBC WITH DIFFERENTIAL/PLATELET
Abs Immature Granulocytes: 0.02 10*3/uL (ref 0.00–0.07)
Basophils Absolute: 0 10*3/uL (ref 0.0–0.1)
Basophils Relative: 0 %
Eosinophils Absolute: 0 10*3/uL (ref 0.0–0.5)
Eosinophils Relative: 0 %
HCT: 41.5 % (ref 39.0–52.0)
Hemoglobin: 14.2 g/dL (ref 13.0–17.0)
Immature Granulocytes: 0 %
Lymphocytes Relative: 27 %
Lymphs Abs: 1.4 10*3/uL (ref 0.7–4.0)
MCH: 34.2 pg — ABNORMAL HIGH (ref 26.0–34.0)
MCHC: 34.2 g/dL (ref 30.0–36.0)
MCV: 100 fL (ref 80.0–100.0)
Monocytes Absolute: 0.6 10*3/uL (ref 0.1–1.0)
Monocytes Relative: 12 %
Neutro Abs: 3.1 10*3/uL (ref 1.7–7.7)
Neutrophils Relative %: 61 %
Platelets: 56 10*3/uL — ABNORMAL LOW (ref 150–400)
RBC: 4.15 MIL/uL — ABNORMAL LOW (ref 4.22–5.81)
RDW: 14.8 % (ref 11.5–15.5)
WBC: 5.2 10*3/uL (ref 4.0–10.5)
nRBC: 0 % (ref 0.0–0.2)

## 2022-01-21 MED ORDER — SODIUM CHLORIDE 0.9 % IV SOLN
INTRAVENOUS | Status: DC
Start: 2022-01-21 — End: 2022-01-22

## 2022-01-21 MED ORDER — MIDAZOLAM HCL 2 MG/2ML IJ SOLN
INTRAMUSCULAR | Status: AC | PRN
  Administered 2022-01-21: .5 mg via INTRAVENOUS

## 2022-01-21 MED ORDER — NALOXONE HCL 0.4 MG/ML IJ SOLN
INTRAMUSCULAR | Status: DC
Start: 2022-01-21 — End: 2022-01-21
  Filled 2022-01-21: qty 1

## 2022-01-21 MED ORDER — MIDAZOLAM HCL 2 MG/2ML IJ SOLN
INTRAMUSCULAR | Status: AC
Start: 2022-01-21 — End: 2022-01-21
  Filled 2022-01-21: qty 4

## 2022-01-21 MED ORDER — MIDAZOLAM HCL 2 MG/2ML IJ SOLN
INTRAMUSCULAR | Status: AC | PRN
  Administered 2022-01-21: 1 mg via INTRAVENOUS

## 2022-01-21 MED ORDER — FENTANYL CITRATE (PF) 100 MCG/2ML IJ SOLN
INTRAMUSCULAR | Status: AC | PRN
  Administered 2022-01-21: 50 ug via INTRAVENOUS

## 2022-01-21 MED ORDER — LIDOCAINE HCL 1 % IJ SOLN
INTRAMUSCULAR | Status: AC | PRN
  Administered 2022-01-21: 10 mL via INTRADERMAL

## 2022-01-21 MED ORDER — FLUMAZENIL 0.5 MG/5ML IV SOLN
INTRAVENOUS | Status: AC
  Filled 2022-01-21: qty 5

## 2022-01-21 MED ORDER — FENTANYL CITRATE (PF) 100 MCG/2ML IJ SOLN
INTRAMUSCULAR | Status: AC | PRN
  Administered 2022-01-21: 25 ug via INTRAVENOUS

## 2022-01-21 MED ORDER — FENTANYL CITRATE (PF) 100 MCG/2ML IJ SOLN
INTRAMUSCULAR | Status: AC
  Filled 2022-01-21: qty 2

## 2022-01-21 NOTE — Discharge Instructions (Addendum)
Please call Interventional Radiology clinic [phone] with any questions or concerns. ? ?You may remove your dressing and shower tomorrow.  Do not submerge in water until site healed. ? ? ?Bone Marrow Aspiration and Bone Marrow Biopsy, Adult, Care After ?This sheet gives you information about how to care for yourself after your procedure. Your health care provider may also give you more specific instructions. If you have problems or questions, contact your health care provider. ?What can I expect after the procedure? ?After the procedure, it is common to have: ?Mild pain and tenderness. ?Swelling. ?Bruising. ?Follow these instructions at home: ?Puncture site care ?Follow instructions from your health care provider about how to take care of the puncture site. Make sure you: ?Wash your hands with soap and water before and after you change your bandage (dressing). If soap and water are not available, use hand sanitizer. ?Change your dressing as told by your health care provider. ?Check your puncture site every day for signs of infection. Check for: ?More redness, swelling, or pain. ?Fluid or blood. ?Warmth. ?Pus or a bad smell.   ?Activity ?Return to your normal activities as told by your health care provider. Ask your health care provider what activities are safe for you. ?Do not lift anything that is heavier than 10 lb (4.5 kg), or the limit that you are told, until your health care provider says that it is safe. ?Do not drive for 24 hours if you were given a sedative during your procedure. ?General instructions ?Take over-the-counter and prescription medicines only as told by your health care provider. ?Do not take baths, swim, or use a hot tub until your health care provider approves. Ask your health care provider if you may take showers. You may only be allowed to take sponge baths. ?If directed, put ice on the affected area. To do this: ?Put ice in a plastic bag. ?Place a towel between your skin and the  bag. ?Leave the ice on for 20 minutes, 2-3 times a day. ?Keep all follow-up visits as told by your health care provider. This is important.   ?Contact a health care provider if: ?Your pain is not controlled with medicine. ?You have a fever. ?You have more redness, swelling, or pain around the puncture site. ?You have fluid or blood coming from the puncture site. ?Your puncture site feels warm to the touch. ?You have pus or a bad smell coming from the puncture site. ?Summary ?After the procedure, it is common to have mild pain, tenderness, swelling, and bruising. ?Follow instructions from your health care provider about how to take care of the puncture site and what activities are safe for you. ?Take over-the-counter and prescription medicines only as told by your health care provider. ?Contact a health care provider if you have any signs of infection, such as fluid or blood coming from the puncture site. ?This information is not intended to replace advice given to you by your health care provider. Make sure you discuss any questions you have with your health care provider. ?Document Revised: 02/06/2019 Document Reviewed: 02/06/2019 ?Elsevier Patient Education ? [place]. ? ? ?Moderate Conscious Sedation, Adult, Care After ?This sheet gives you information about how to care for yourself after your procedure. Your health care provider may also give you more specific instructions. If you have problems or questions, contact your health care provider. ?What can I expect after the procedure? ?After the procedure, it is common to have: ?Sleepiness for several hours. ?Impaired judgment for several  hours. ?Difficulty with balance. ?Vomiting if you eat too soon. ?Follow these instructions at home: ?For the time period you were told by your health care provider: ?Rest. ?Do not participate in activities where you could fall or become injured. ?Do not drive or use machinery. ?Do not drink alcohol. ?Do not take sleeping  pills or medicines that cause drowsiness. ?Do not make important decisions or sign legal documents. ?Do not take care of children on your own.  ?  ?  ?Eating and drinking ?Follow the diet recommended by your health care provider. ?Drink enough fluid to keep your urine pale yellow. ?If you vomit: ?Drink water, juice, or soup when you can drink without vomiting. ?Make sure you have little or no nausea before eating solid foods.   ?General instructions ?Take over-the-counter and prescription medicines only as told by your health care provider. ?Have a responsible adult stay with you for the time you are told. It is important to have someone help care for you until you are awake and alert. ?Do not smoke. ?Keep all follow-up visits as told by your health care provider. This is important. ?Contact a health care provider if: ?You are still sleepy or having trouble with balance after 24 hours. ?You feel light-headed. ?You keep feeling nauseous or you keep vomiting. ?You develop a rash. ?You have a fever. ?You have redness or swelling around the IV site. ?Get help right away if: ?You have trouble breathing. ?You have new-onset confusion at home. ?Summary ?After the procedure, it is common to feel sleepy, have impaired judgment, or feel nauseous if you eat too soon. ?Rest after you get home. Know the things you should not do after the procedure. ?Follow the diet recommended by your health care provider and drink enough fluid to keep your urine pale yellow. ?Get help right away if you have trouble breathing or new-onset confusion at home. ?This information is not intended to replace advice given to you by your health care provider. Make sure you discuss any questions you have with your health care provider. ?Document Revised: 01/18/2020 Document Reviewed: 08/16/2019 ?Elsevier Patient Education ? [place]. ?

## 2022-01-21 NOTE — Consult Note (Signed)
? ?Chief Complaint: ?Patient was seen in consultation today for CT guided bone marrow biopsy ? ?Referring Physician(s): ?Pennington,Rebekah M ? ?Supervising Physician: Markus Daft ? ?Patient Status: [name] ? ?History of Present Illness: ?[name] is a 56 y.o. male smoker with past medical history significant for arthritis, asthma, avascular necrosis of right hip with prior replacement, bipolar disorder, Crohn's colitis with prior colectomy and ileostomy, depression, recurrent DVT, GERD, hyperlipidemia, migraine headaches, PE, schizophrenia.  He now presents with persistent thrombocytopenia and leukopenia of uncertain etiology.  He is scheduled today for CT-guided bone marrow biopsy for further evaluation. ? ?Past Medical History:  ?Diagnosis Date  ? Arthritis   ? Asthma   ? 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[place])   ? right, s/p replacement, due to prednisone  ? Bipolar disorder ([place])   ? Colostomy in place [facility])   ? Crohn's colitis ([place])   ? s/p total colectomy with ileostomy in 2009  ? Crohn's disease of small intestine [facility]) Sept 2012  ? ileoscopy: multiple ulcers likely secondary to Crohn's  ? DEEP VENOUS THROMBOPHLEBITIS, HX OF 02/11/2009  ? Qualifier: Diagnosis of  By: Zeb Comfort    ? Depression   ? DVT (deep venous thrombosis) ([place]) 2009  ? right upper extremity due to PICC  ? Enterococcus UTI 2009  ? GERD (gastroesophageal reflux disease)   ? Hernia   ? Hyperlipidemia   ? Insomnia   ? Migraine headache   ? Nystagmus   ? PULMONARY EMBOLISM, HX OF 02/11/2009  ? Qualifier: Diagnosis of  By: Zeb Comfort    ? S/P endoscopy Sept 2012  ? mild gastritis, small hiatal hernia, no ulcers  ? Schizophrenia ([place])   ? Schizophrenia, acute ([place])   ? ? ?Past Surgical History:  ?Procedure Laterality Date  ? APPENDECTOMY    ? ESOPHAGOGASTRODUODENOSCOPY  07/2010  ? gastritis,  bx neg for H.Pylori  ? ESOPHAGOGASTRODUODENOSCOPY N/A 01/08/2014  ? SLF:NO SOURCE FOR ODYNOPHAGIA IDENTIFIED/Multiple small ulcers in  the gastric antrum  ? EYE SURGERY    ? ILEOSCOPY  06/29/2011  ? SLF: ulcers,multiple/small HH/mild gastritis  ? KIDNEY SURGERY    ? LAPAROTOMY N/A 06/05/2013  ? Procedure: EXPLORATORY LAPAROTOMY;  Surgeon: Donato Heinz, MD;  Location: [facility];  Service: General;  Laterality: N/A;  ? LYSIS OF ADHESION N/A 06/05/2013  ? Procedure: LYSIS OF ADHESIONS;  Surgeon: Donato Heinz, MD;  Location: [facility];  Service: General;  Laterality: N/A;  ? small bowel capsule study  08/2010  ? few tiny nonbleeding erosions/ulcers ?secondary to relafen or Crohn's. Entire small bowel not seen.   ? TOTAL COLECTOMY  2009  ? with ileostomy at [facility] for refractory disease   ? TOTAL HIP ARTHROPLASTY    ? right, due to avascular necrosis from chronic prednisone  ? TOTAL SHOULDER REPLACEMENT    ? bilateral  ? TRANSURETHRAL RESECTION OF PROSTATE N/A 12/03/2019  ? Procedure: TRANSURETHRAL RESECTION OF THE PROSTATE (TURP);  Surgeon: Cleon Gustin, MD;  Location: [facility];  Service: Urology;  Laterality: N/A;  ? ? ?Allergies: ?Penicillins, Bactrim [sulfamethoxazole-trimethoprim], Oxycodone-acetaminophen, Remicade [infliximab], Aspirin, Codeine, Ibuprofen, Macrobid [nitrofurantoin], Tramadol, Tramadol hcl, and Vicodin [hydrocodone-acetaminophen] ? ?Medications: ?Prior to Admission medications   ?Medication Sig Start Date End Date Taking? Authorizing Provider  ?ACCU-CHEK AVIVA PLUS test strip SMARTSIG:Via Meter 05/02/20   [provider]  ?albuterol (PROVENTIL) (2.5 MG/3ML) 0.083% nebulizer solution Take 2.5 mg by nebulization every 6 (six) hours as needed for wheezing or  shortness of breath.    [provider]  ?albuterol (VENTOLIN HFA) 108 (90 Base) MCG/ACT inhaler Inhale 2 puffs into the lungs every 6 (six) hours as needed for wheezing or shortness of breath.    [provider]  ?atorvastatin (LIPITOR) 80 MG tablet Take 80 mg by mouth daily.    [provider]  ?azelastine (ASTELIN) 0.1 % nasal spray Place 2  sprays into both nostrils 2 (two) times daily. 05/15/20   [provider]  ?baclofen (LIORESAL) 10 MG tablet Take 10 mg by mouth daily. 10/04/19   [provider]  ?benztropine (COGENTIN) 0.5 MG tablet Take 0.5 mg by mouth daily.     [provider]  ?CALCIUM 600/VITAMIN D3 600-800 MG-UNIT TABS TAKE (1) TABLET BY MOUTH THREE TIMES DAILY AFTER MEALS. 06/05/21   Mahala Menghini, PA-C  ?cyanocobalamin 1000 MCG tablet Take 1 tablet by mouth daily.    [provider]  ?dicyclomine (BENTYL) 10 MG capsule TAKE 1 CAPSULE BY MOUTH BEFORE MEALS AND AT BEDTIME AS NEEDED FOR ABDOMINAL CRAMPING. *HOLD FOR CONSTIPATION* 06/05/21   Mahala Menghini, PA-C  ?divalproex (DEPAKOTE) 250 MG DR tablet Take by mouth at bedtime. 11/21/20   [provider]  ?divalproex (DEPAKOTE) 500 MG 24 hr tablet Take 1,000 mg by mouth every evening.    [provider]  ?docusate sodium (COLACE) 100 MG capsule Take 1 capsule (100 mg total) by mouth 2 (two) times daily. 02/14/18   Kathie Dike, MD  ?doxycycline (VIBRAMYCIN) 100 MG capsule Take 1 capsule (100 mg total) by mouth every 12 (twelve) hours. 01/20/22   McKenzie, Candee Furbish, MD  ?Arne [place] 5 MG TABS tablet Take 5 mg by mouth 2 (two) times daily. 07/14/20   [provider]  ?ferrous sulfate 325 (65 FE) MG EC tablet Take 1 tablet (325 mg total) by mouth daily with breakfast. 10/07/21   Harriett Rush, PA-C  ?FLOVENT HFA 110 MCG/ACT inhaler Inhale 2 puffs into the lungs 2 (two) times daily. 01/19/21   [provider]  ?folic acid (FOLVITE) 1 MG tablet Take 1 tablet by mouth daily.    [provider]  ?furosemide (LASIX) 40 MG tablet Take 40 mg by mouth daily.    [provider]  ?gabapentin (NEURONTIN) 300 MG capsule Take 900 mg by mouth 3 (three) times daily.    [provider]  ?hydroxychloroquine (PLAQUENIL) 200 MG tablet Take 1 tablet by mouth 2 (two) times daily. 07/06/21   [provider]   ?meloxicam (MOBIC) 7.5 MG tablet Take 7.5 mg by mouth daily. 11/08/19   [provider]  ?metoprolol succinate (TOPROL-XL) 25 MG 24 hr tablet Take 1 tablet (25 mg total) by mouth in the morning and at bedtime. Take with or immediately following a meal. 12/08/21   Baldwin Jamaica, PA-C  ?Misc. Devices (WALKER WHEELS) MISC USE AS DIRECTED 06/06/19   Fields, Marga Melnick, MD  ?mometasone-formoterol [facility]) 200-5 MCG/ACT AERO Inhale 2 puffs into the lungs in the morning and at bedtime. 01/19/21   Freddi Starr, MD  ?nitroGLYCERIN (NITROSTAT) 0.4 MG SL tablet Place 1 tablet (0.4 mg total) under the tongue every 5 (five) minutes as needed for chest pain. 01/26/21 07/11/21  Elouise Munroe, MD  ?Ostomy Supplies (ACTIVE LIFE 1-PC DRAIN [phone]) Pouch MISC  05/21/20   [provider]  ?pantoprazole (PROTONIX) 40 MG tablet TAKE (1) TABLET BY MOUTH TWICE A DAY BEFORE MEALS. (BREAKFAST AND SUPPER) 08/01/20   Bobby Rumpf,  Laureen Ochs, PA-C  ?predniSONE (DELTASONE) 20 MG tablet Take by mouth. 01/18/22   [provider]  ?risperiDONE (RISPERDAL) 3 MG tablet Take 3 mg by mouth 2 (two) times daily. 05/12/21   [provider]  ?sertraline (ZOLOFT) 50 MG tablet Take 50 mg by mouth daily.    [provider]  ?tamsulosin (FLOMAX) 0.4 MG CAPS capsule Take 0.4 mg by mouth daily. 07/03/20   [provider]  ?Tiotropium Bromide Monohydrate (SPIRIVA RESPIMAT) 2.5 MCG/ACT AERS Inhale 2 puffs into the lungs daily. 01/19/21   Freddi Starr, MD  ?traMADol Veatrice Bourbon) 50 MG tablet 1 tablet as needed 04/13/21   [provider]  ?traZODone (DESYREL) 100 MG tablet Take 100 mg by mouth at bedtime. 05/12/21   [provider]  ?  ? ?Family History  ?Problem Relation Age of Onset  ? Diabetes Mother   ? Heart disease Mother   ? Diabetes Father   ? Heart disease Father   ? COPD Maternal Grandmother   ? Colon cancer Neg Hx   ? ? ?Social History  ? ?Socioeconomic History  ? Marital status: Single  ?   Spouse name: Not on file  ? Number of children: Not on file  ? Years of education: Not on file  ? Highest education level: Not on file  ?Occupational History  ? Not on file  ?Tobacco Use  ? Smoking status: Every

## 2022-01-21 NOTE — Procedures (Signed)
Interventional Radiology Procedure: ? ? ?Indications: Persistent thrombocytopenia and leukopenia without clear etiology ? ?Procedure: CT guided bone marrow biopsy ? ?Findings: 2 aspirates and 1 core from right ilium ? ?Complications: None ?    ?EBL: Minimal, less than 10 ml ? ?Plan: Discharge to home in one hour. ? ? ?Karagan Lehr R. Anselm Pancoast, MD  ?Pager: [phone] ? ?  ?  ?

## 2022-01-25 ENCOUNTER — Other Ambulatory Visit: Payer: Self-pay | Admitting: Urology

## 2022-01-25 ENCOUNTER — Other Ambulatory Visit: Payer: Self-pay

## 2022-01-25 DIAGNOSIS — N390 Urinary tract infection, site not specified: Secondary | ICD-10-CM

## 2022-01-25 LAB — URINE CULTURE

## 2022-01-25 NOTE — Telephone Encounter (Signed)
Abt allergy ?

## 2022-01-26 MED ORDER — NITROFURANTOIN MONOHYD MACRO 100 MG PO CAPS: 100.0000 mg | ORAL_CAPSULE | Freq: Two times a day (BID) | ORAL | 0 refills | Status: DC

## 2022-01-26 NOTE — Telephone Encounter (Signed)
Pt started on Macrobid. Rx sent in. Patient called and made aware.  ?Patient will take benadryl if he begins to itch. ?

## 2022-01-27 ENCOUNTER — Other Ambulatory Visit: Payer: Medicaid Other | Admitting: Urology

## 2022-02-01 ENCOUNTER — Encounter (HOSPITAL_COMMUNITY): Payer: Self-pay

## 2022-02-01 LAB — SURGICAL PATHOLOGY

## 2022-02-03 ENCOUNTER — Encounter: Payer: Self-pay | Admitting: Urology

## 2022-02-03 ENCOUNTER — Ambulatory Visit (INDEPENDENT_AMBULATORY_CARE_PROVIDER_SITE_OTHER): Payer: Medicaid Other | Admitting: Urology

## 2022-02-03 VITALS — BP 111/66 | HR 68

## 2022-02-03 DIAGNOSIS — N320 Bladder-neck obstruction: Secondary | ICD-10-CM | POA: Diagnosis not present

## 2022-02-03 DIAGNOSIS — N390 Urinary tract infection, site not specified: Secondary | ICD-10-CM

## 2022-02-03 DIAGNOSIS — R300 Dysuria: Secondary | ICD-10-CM | POA: Diagnosis not present

## 2022-02-03 DIAGNOSIS — Z87440 Personal history of urinary (tract) infections: Secondary | ICD-10-CM

## 2022-02-03 LAB — MICROSCOPIC EXAMINATION
Epithelial Cells (non renal): NONE SEEN /hpf (ref 0–10)
RBC, Urine: >30 /hpf — AB (ref 0–2)
Renal Epithel, UA: NONE SEEN /hpf

## 2022-02-03 LAB — URINALYSIS, ROUTINE W REFLEX MICROSCOPIC
Bilirubin, UA: NEGATIVE
Glucose, UA: NEGATIVE
Nitrite, UA: NEGATIVE
Specific Gravity, UA: 1.025 (ref 1.005–1.030)
Urobilinogen, Ur: 0.2 mg/dL (ref 0.2–1.0)
pH, UA: 7 (ref 5.0–7.5)

## 2022-02-03 MED ORDER — CIPROFLOXACIN HCL 500 MG PO TABS: 500.0000 mg | ORAL_TABLET | Freq: Once | ORAL | Status: DC

## 2022-02-03 NOTE — Progress Notes (Signed)
? ?[facility] ?[address] ?[place], [place] [address] ? ? ?CLINIC:  ?Medical Oncology/Hematology ? ?PCP:  ?[name], MD ?[address] ?[place] [address] ?[phone] ? ? ?REASON FOR VISIT:  ?Follow-up for pancytopenia, iron deficiency, recurrent DVTs ?  ?CURRENT THERAPY: Eliquis ? ?INTERVAL HISTORY:  ?[name] 56 y.o. male returns for routine follow-up of pancytopenia, iron deficiency, and recurrent DVTs.  He was last seen by Tarri Abernethy PA-C on 01/05/2022.  He had bone marrow biopsy on 01/21/2022 and returns today to discuss results. ? ?At today's visit, he reports feeling fair.  He continues to take Eliquis without any major bleeding issues.  He has easy bruising.  No current symptoms concerning for DVT or PE.  He denies any B symptoms such as fever, chills, night sweats, unintentional weight loss. ? ?He has some shortness of breath, but this only occurs when he is smoking cigarettes, so he is trying to quit.  He is currently down to 2 cigarettes daily. ? ?He continues to have frequent urinary tract infections.  He is not currently on any antibiotics, but finished a course of Macrobid a few days ago. ? ?He has 50% energy and 70% appetite. He endorses that he is maintaining a stable weight. ? ? ?REVIEW OF SYSTEMS:  ?Review of Systems  ?Constitutional:  Positive for fatigue. Negative for appetite change, chills, diaphoresis, fever and unexpected weight change.  ?HENT:   Negative for lump/mass and nosebleeds.   ?Eyes:  Negative for eye problems.  ?Respiratory:  Positive for cough (when smoking). Negative for hemoptysis and shortness of breath.   ?Cardiovascular:  Negative for chest pain, leg swelling and palpitations.  ?Gastrointestinal:  Positive for nausea and vomiting. Negative for abdominal pain, blood in stool, constipation and diarrhea.  ?Genitourinary:  Positive for dysuria and frequency. Negative for hematuria.   ?Skin: Negative.   ?Neurological:  Negative for dizziness,  headaches and light-headedness.  ?Hematological:  Does not bruise/bleed easily.   ? ? ?PAST MEDICAL/SURGICAL HISTORY:  ?Past Medical History:  ?Diagnosis Date  ? Arthritis   ? Asthma   ? 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[place])   ? right, s/p replacement, due to prednisone  ? Bipolar disorder ([place])   ? Colostomy in place [facility])   ? Crohn's colitis ([place])   ? s/p total colectomy with ileostomy in 2009  ? Crohn's disease of small intestine [facility]) Sept 2012  ? ileoscopy: multiple ulcers likely secondary to Crohn's  ? DEEP VENOUS THROMBOPHLEBITIS, HX OF 02/11/2009  ? Qualifier: Diagnosis of  By: Zeb Comfort    ? Depression   ? DVT (deep venous thrombosis) ([place]) 2009  ? right upper extremity due to PICC  ? Enterococcus UTI 2009  ? GERD (gastroesophageal reflux disease)   ? Hernia   ? Hyperlipidemia   ? Insomnia   ? Migraine headache   ? Nystagmus   ? PULMONARY EMBOLISM, HX OF 02/11/2009  ? Qualifier: Diagnosis of  By: Zeb Comfort    ? S/P endoscopy Sept 2012  ? mild gastritis, small hiatal hernia, no ulcers  ? Schizophrenia ([place])   ? Schizophrenia, acute ([place])   ? ?Past Surgical History:  ?Procedure Laterality Date  ? APPENDECTOMY    ? ESOPHAGOGASTRODUODENOSCOPY  07/2010  ? gastritis,  bx neg for H.Pylori  ? ESOPHAGOGASTRODUODENOSCOPY N/A 01/08/2014  ? SLF:NO SOURCE FOR ODYNOPHAGIA IDENTIFIED/Multiple small ulcers in the gastric antrum  ? EYE SURGERY    ? ILEOSCOPY  06/29/2011  ?  SLF: ulcers,multiple/small HH/mild gastritis  ? KIDNEY SURGERY    ? LAPAROTOMY N/A 06/05/2013  ? Procedure: EXPLORATORY LAPAROTOMY;  Surgeon: Donato Heinz, MD;  Location: [facility];  Service: General;  Laterality: N/A;  ? LYSIS OF ADHESION N/A 06/05/2013  ? Procedure: LYSIS OF ADHESIONS;  Surgeon: Donato Heinz, MD;  Location: [facility];  Service: General;  Laterality: N/A;  ? small bowel capsule study  08/2010  ? few tiny nonbleeding erosions/ulcers ?secondary to relafen or Crohn's. Entire small bowel not seen.   ? TOTAL COLECTOMY  2009  ? with  ileostomy at [facility] for refractory disease   ? TOTAL HIP ARTHROPLASTY    ? right, due to avascular necrosis from chronic prednisone  ? TOTAL SHOULDER REPLACEMENT    ? bilateral  ? TRANSURETHRAL RESECTION OF PROSTATE N/A 12/03/2019  ? Procedure: TRANSURETHRAL RESECTION OF THE PROSTATE (TURP);  Surgeon: Cleon Gustin, MD;  Location: [facility];  Service: Urology;  Laterality: N/A;  ? ? ? ?SOCIAL HISTORY:  ?Social History  ? ?Socioeconomic History  ? Marital status: Single  ?  Spouse name: Not on file  ? Number of children: Not on file  ? Years of education: Not on file  ? Highest education level: Not on file  ?Occupational History  ? Not on file  ?Tobacco Use  ? Smoking status: Every Day  ?  Packs/day: 4.00  ?  Years: 37.00  ?  Pack years: 148.00  ?  Types: Cigarettes  ? Smokeless tobacco: Never  ? Tobacco comments:  ?  down to 4ppd 11/14/20  ?Vaping Use  ? Vaping Use: Never used  ?Substance and Sexual Activity  ? Alcohol use: No  ? Drug use: No  ? Sexual activity: Never  ?Other Topics Concern  ? Not on file  ?Social History Narrative  ? Girlfriend of six years, Rectal Cancer on Hospice (05/2011).  ? ?Social Determinants of Health  ? ?Financial Resource Strain: Not on file  ?Food Insecurity: Not on file  ?Transportation Needs: Not on file  ?Physical Activity: Not on file  ?Stress: Not on file  ?Social Connections: Not on file  ?Intimate Partner Violence: Not on file  ? ? ?FAMILY HISTORY:  ?Family History  ?Problem Relation Age of Onset  ? Diabetes Mother   ? Heart disease Mother   ? Diabetes Father   ? Heart disease Father   ? COPD Maternal Grandmother   ? Colon cancer Neg Hx   ? ? ?CURRENT MEDICATIONS:  ?Outpatient Encounter Medications as of 02/04/2022  ?Medication Sig  ? ACCU-CHEK AVIVA PLUS test strip SMARTSIG:Via Meter  ? albuterol (PROVENTIL) (2.5 MG/3ML) 0.083% nebulizer solution Take 2.5 mg by nebulization every 6 (six) hours as needed for wheezing or shortness of breath.  ? albuterol (VENTOLIN HFA) 108 (90 Base)  MCG/ACT inhaler Inhale 2 puffs into the lungs every 6 (six) hours as needed for wheezing or shortness of breath.  ? atorvastatin (LIPITOR) 80 MG tablet Take 80 mg by mouth daily.  ? azelastine (ASTELIN) 0.1 % nasal spray Place 2 sprays into both nostrils 2 (two) times daily.  ? baclofen (LIORESAL) 10 MG tablet Take 10 mg by mouth daily.  ? benztropine (COGENTIN) 0.5 MG tablet Take 0.5 mg by mouth daily.   ? CALCIUM 600/VITAMIN D3 600-800 MG-UNIT TABS TAKE (1) TABLET BY MOUTH THREE TIMES DAILY AFTER MEALS.  ? cyanocobalamin 1000 MCG tablet Take 1 tablet by mouth daily.  ? dicyclomine (BENTYL) 10 MG capsule TAKE 1 CAPSULE  BY MOUTH BEFORE MEALS AND AT BEDTIME AS NEEDED FOR ABDOMINAL CRAMPING. *HOLD FOR CONSTIPATION*  ? divalproex (DEPAKOTE) 250 MG DR tablet Take by mouth at bedtime.  ? divalproex (DEPAKOTE) 500 MG 24 hr tablet Take 1,000 mg by mouth every evening.  ? docusate sodium (COLACE) 100 MG capsule Take 1 capsule (100 mg total) by mouth 2 (two) times daily.  ? doxycycline (VIBRAMYCIN) 100 MG capsule Take 1 capsule (100 mg total) by mouth every 12 (twelve) hours.  ? ELIQUIS 5 MG TABS tablet Take 5 mg by mouth 2 (two) times daily.  ? ferrous sulfate 325 (65 FE) MG EC tablet Take 1 tablet (325 mg total) by mouth daily with breakfast.  ? FLOVENT HFA 110 MCG/ACT inhaler Inhale 2 puffs into the lungs 2 (two) times daily.  ? folic acid (FOLVITE) 1 MG tablet Take 1 tablet by mouth daily.  ? furosemide (LASIX) 40 MG tablet Take 40 mg by mouth daily.  ? gabapentin (NEURONTIN) 300 MG capsule Take 900 mg by mouth 3 (three) times daily.  ? hydroxychloroquine (PLAQUENIL) 200 MG tablet Take 1 tablet by mouth 2 (two) times daily.  ? meloxicam (MOBIC) 7.5 MG tablet Take 7.5 mg by mouth daily.  ? metoprolol succinate (TOPROL-XL) 25 MG 24 hr tablet Take 1 tablet (25 mg total) by mouth in the morning and at bedtime. Take with or immediately following a meal.  ? Misc. Devices (WALKER WHEELS) MISC USE AS DIRECTED  ?  mometasone-formoterol (DULERA) 200-5 MCG/ACT AERO Inhale 2 puffs into the lungs in the morning and at bedtime.  ? nitrofurantoin, macrocrystal-monohydrate, (MACROBID) 100 MG capsule Take 1 capsule (100 mg total) by mouth 2 (tw

## 2022-02-03 NOTE — Progress Notes (Signed)
? ?  02/03/22 ? ?CC: difficulty urinating ? ? ?HPI: ?[name] is a 56yo here for cystoscopy for dysuria and recurrent UTI. ?Blood pressure 111/66, pulse 68. ?NED. A&Ox3.   ?No respiratory distress   ?Abd soft, NT, ND ?Normal phallus with bilateral descended testicles ? ?Cystoscopy Procedure Note ? ?Patient identification was confirmed, informed consent was obtained, and patient was prepped using Betadine solution.  Lidocaine jelly was administered per urethral meatus.   ? ? ?Pre-Procedure: ?- Inspection reveals a normal caliber ureteral meatus. ? ?Procedure: ?The flexible cystoscope was introduced without difficulty ?- No urethral strictures/lesions are present. 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   ?- Tight bladder neck ?- Bilateral ureteral orifices identified ?- Bladder mucosa  reveals no ulcers, tumors, or lesions ?- No bladder stones ?- mild trabeculation ? ?Retroflexion shows no intravesical prostatic protrusion ? ? ?Post-Procedure: ?- Patient tolerated the procedure well ? ?Assessment/ Plan: ?We discussed the management of bladder neck contractures including cystoscopy with bladder neck incision. After discussing the treatment options the patient elects to proceed with surgery. Risks/benefits/alternatives discussed ? ?No follow-ups on file. ? ?[name], MD  ?

## 2022-02-03 NOTE — Progress Notes (Signed)
I spoke with Mr. Mennenga. We have discussed possible surgery dates and 02/25/2022 was agreed upon by all parties. Patient given information about surgery date, what to expect pre-operatively and post operatively.  ?  ?We discussed that a pre-op nurse will be calling to set up the pre-op visit that will take place prior to surgery. Informed patient that our office will communicate any additional care to be provided after surgery.  ?  ?Patients questions or concerns were discussed during our call. Advised to call our office should there be any additional information, questions or concerns that arise. Patient verbalized understanding.   ?

## 2022-02-03 NOTE — Patient Instructions (Signed)
Urethral Stricture ? ?Urethral stricture is narrowing of the tube (urethra) that carries urine from the bladder out of the body. The urethra can become narrow due to scar tissue from an injury or infection. This can make it difficult to pass urine. ?In women, the urethra opens above the vaginal opening. In men, the urethra opens at the tip of the penis, and the urethra is much longer than it is in women. Because of the length of the male urethra, urethral stricture is much more common in men. This condition is treated with surgery. ?What are the causes? ?In both men and women, common causes of urethral stricture include: ?Urinary tract infection (UTI). ?Sexually transmitted infection (STI). ?Use of a tube placed into the urethra to drain urine from the bladder (urinary catheter). ?Urinary tract surgery. ?In men, common causes of urethral stricture include: ?A severe injury to the pelvis. ?Prostate surgery. ?Injury to the penis. ?In many cases, the cause of urethral stricture is not known. ?What increases the risk? ?You are more likely to develop this condition if you: ?Are male. Men who have had prostate surgery are at risk of developing this condition. ?Use a urinary catheter. ?Have had urinary tract surgery. ?What are the signs or symptoms? ?The main symptom of this condition is difficulty passing urine. This may cause decreased urine flow, dribbling, or spraying of urine. Other symptom of this condition may include: ?Frequent UTIs. ?Blood in the urine. ?Pain when urinating. ?Swelling of the penis in men. ?Inability to pass urine (urinary obstruction). ?How is this diagnosed? ?This condition may be diagnosed based on: ?Your medical history and a physical exam. ?Urine tests to check for infection or bleeding. ?X-rays. ?Ultrasound. ?Retrograde urethrogram. This is a type of test in which dye is injected into the urethra and then an X-ray is taken. ?Urethroscopy. This is when a thin tube with a light and camera on  the end (urethroscope) is used to look at the urethra. ?How is this treated? ?This condition is treated with surgery. The type of surgery that you have depends on the severity of your condition. You may have: ?Urethral dilation. In this procedure, the narrow part of the urethra is stretched open (dilated) with dilating instruments or a small balloon. ?Urethrotomy. In this procedure, a urethroscope is placed into the urethra, and the narrow part of the urethra is cut open with a surgical blade inserted through the urethroscope. ?Open surgery. In this procedure, an incision is made in the urethra, the narrow part is removed, and the urethra is reconstructed. ?Follow these instructions at home: ? ?Take over-the-counter and prescription medicines only as told by your health care provider. ?If you were prescribed an antibiotic medicine, take it as told by your health care provider. Do not stop taking the antibiotic even if you start to feel better. ?Drink enough fluid to keep your urine pale yellow. ?Keep all follow-up visits as told by your health care provider. This is important. ?Contact a health care provider if: ?You have signs of a urinary tract infection, such as: ?Frequent urination or passing small amounts of urine frequently. ?Needing to urinate urgently. ?Pain or burning with urination. ?Urine that smells bad or unusual. ?Cloudy urine. ?Pain in the lower abdomen or back. ?Trouble urinating. ?Blood in the urine. ?Vomiting or being less hungry than normal. ?Diarrhea or abdominal pain. ?Vaginal discharge, if you are male. ?Your symptoms are getting worse instead of better. ?Get help right away if: ?You cannot pass urine. ?You have a fever. ?  You have swelling, bruising, or discoloration of your genital area. This includes the penis, scrotum, and inner thighs for men, and the outer genital organs (vulva) and inner thighs for women. ?You develop swelling in your legs. ?You have difficulty  breathing. ?Summary ?Urethral stricture is narrowing of the tube (urethra) that carries urine from the bladder out of the body. The urethra can become narrow due to scar tissue from an injury or infection. ?This condition can make it difficult to pass urine. ?This condition is treated with surgery. The type of surgery that you have depends on the severity of your condition. ?Contact a health care provider if your symptoms get worse or you have signs of a urinary tract infection. ?This information is not intended to replace advice given to you by your health care provider. Make sure you discuss any questions you have with your health care provider. ?Document Revised: 07/28/2021 Document Reviewed: 07/28/2021 ?Elsevier Patient Education ? [place]. ? ?

## 2022-02-04 ENCOUNTER — Inpatient Hospital Stay (HOSPITAL_COMMUNITY): Payer: Medicaid Other | Attending: Hematology | Admitting: Physician Assistant

## 2022-02-04 VITALS — BP 130/72 | HR 72 | Temp 98.3°F | Resp 20 | Ht 65.75 in | Wt 224.4 lb

## 2022-02-04 DIAGNOSIS — Z87440 Personal history of urinary (tract) infections: Secondary | ICD-10-CM | POA: Insufficient documentation

## 2022-02-04 DIAGNOSIS — K5090 Crohn's disease, unspecified, without complications: Secondary | ICD-10-CM | POA: Diagnosis not present

## 2022-02-04 DIAGNOSIS — M069 Rheumatoid arthritis, unspecified: Secondary | ICD-10-CM | POA: Insufficient documentation

## 2022-02-04 DIAGNOSIS — D61818 Other pancytopenia: Secondary | ICD-10-CM

## 2022-02-04 DIAGNOSIS — D72819 Decreased white blood cell count, unspecified: Secondary | ICD-10-CM

## 2022-02-04 DIAGNOSIS — Z86718 Personal history of other venous thrombosis and embolism: Secondary | ICD-10-CM | POA: Diagnosis not present

## 2022-02-04 DIAGNOSIS — Z7901 Long term (current) use of anticoagulants: Secondary | ICD-10-CM | POA: Diagnosis not present

## 2022-02-04 DIAGNOSIS — E538 Deficiency of other specified B group vitamins: Secondary | ICD-10-CM

## 2022-02-04 DIAGNOSIS — D696 Thrombocytopenia, unspecified: Secondary | ICD-10-CM | POA: Diagnosis not present

## 2022-02-04 DIAGNOSIS — I82409 Acute embolism and thrombosis of unspecified deep veins of unspecified lower extremity: Secondary | ICD-10-CM | POA: Diagnosis not present

## 2022-02-04 DIAGNOSIS — F17210 Nicotine dependence, cigarettes, uncomplicated: Secondary | ICD-10-CM | POA: Insufficient documentation

## 2022-02-04 DIAGNOSIS — E611 Iron deficiency: Secondary | ICD-10-CM

## 2022-02-04 DIAGNOSIS — Z86711 Personal history of pulmonary embolism: Secondary | ICD-10-CM | POA: Insufficient documentation

## 2022-02-04 DIAGNOSIS — F319 Bipolar disorder, unspecified: Secondary | ICD-10-CM | POA: Insufficient documentation

## 2022-02-04 DIAGNOSIS — J45909 Unspecified asthma, uncomplicated: Secondary | ICD-10-CM | POA: Diagnosis not present

## 2022-02-04 NOTE — Patient Instructions (Signed)
[place] at [facility] ?Discharge Instructions ? ?You were seen today by Tarri Abernethy PA-C for your follow-up appointment.   ? ?Your bone marrow biopsy did not show any signs of bone marrow or blood cancer.  This means that your low white blood cells and low platelets are most likely related to your recurrent infections and your underlying autoimmune disease.  You do not need treatment of your low white blood cells and low platelets at this time, but we will continue to monitor them with periodic lab work. ? ?Continue taking your daily vitamin B12 and daily iron supplements. ? ?Continue taking Eliquis. ? ?FOLLOW-UP APPOINTMENT: We will check repeat labs and follow-up visit in 4 months. ? ? ?Thank you for choosing [place] at [facility] to provide your oncology and hematology care.  To afford each patient quality time with our provider, please arrive at least 15 minutes before your scheduled appointment time.  ? ?If you have a lab appointment with the [facility] please come in thru the Main Entrance and check in at the main information desk. ? ?You need to re-schedule your appointment should you arrive 10 or more minutes late.  We strive to give you quality time with our providers, and arriving late affects you and other patients whose appointments are after yours.  Also, if you no show three or more times for appointments you may be dismissed from the clinic at the providers discretion.     ?Again, thank you for choosing [facility].  Our hope is that these requests will decrease the amount of time that you wait before being seen by our physicians.       ?_____________________________________________________________ ? ?Should you have questions after your visit to [facility], please contact our office at [phone] and follow the prompts.  Our office hours are 8:00 a.m. and 4:30 p.m. Monday - Friday.  Please note that  voicemails left after 4:00 p.m. may not be returned until the following business day.  We are closed weekends and major holidays.  You do have access to a nurse 24-7, just call the main number to the clinic [phone] and do not press any options, hold on the line and a nurse will answer the phone.   ? ?For prescription refill requests, have your pharmacy contact our office and allow 72 hours.   ? ?Due to Covid, you will need to wear a mask upon entering the hospital. If you do not have a mask, a mask will be given to you at the Main Entrance upon arrival. For doctor visits, patients may have 1 support person age 68 or older with them. For treatment visits, patients can not have anyone with them due to social distancing guidelines and our immunocompromised population.  ? ? ? ?

## 2022-02-09 ENCOUNTER — Telehealth: Payer: Self-pay

## 2022-02-09 NOTE — Telephone Encounter (Signed)
I called PCP office today to check on PCP medical clearance. Front staff will have Dr. Bartolo Darter nurse return my call.  ?

## 2022-02-11 ENCOUNTER — Telehealth: Payer: Self-pay

## 2022-02-11 NOTE — Telephone Encounter (Signed)
Reached back out to Dr. Bartolo Darter office. MD is out of office all week but surgical clearance form refaxed to MD office per staff request at Dr. Bartolo Darter office. ? ?Clearance refaxed to [phone] ?

## 2022-02-11 NOTE — Telephone Encounter (Signed)
Patient left voicemail asking when he needed to stop his Eliquis.  Returned call and informed him to stop taking rx two day prior to his surgery date.  Patient voiced understanding. ?

## 2022-02-19 NOTE — Patient Instructions (Signed)
[name]  02/19/2022     @PREFPERIOPPHARMACY @   Your procedure is scheduled on  02/25/2022.   Report to [facility] at  0900  A.M.   Call this number if you have problems the morning of surgery:  [phone]   Remember:  Do not eat or drink after midnight.       Use your nebulizer and your inhaler before you come and bring your rescue inhaler with you.     Take these medicines the morning of surgery with A SIP OF WATER       baclofen, cogentin, depakote, gabapentin, plaquenil, mobic(if needed), metoprolol, protonix, deltasone, risperdal, zoloft, flomax, tramadol(if needed).    Do not wear jewelry, make-up or nail polish.  Do not wear lotions, powders, or perfumes, or deodorant.  Do not shave 48 hours prior to surgery.  Men may shave face and neck.  Do not bring valuables to the hospital.  [facility] is not responsible for any belongings or valuables.  Contacts, dentures or bridgework may not be worn into surgery.  Leave your suitcase in the car.  After surgery it may be brought to your room.  For patients admitted to the hospital, discharge time will be determined by your treatment team.  Patients discharged the day of surgery will not be allowed to drive home and must have someone with them for 24 hours.    Special instructions:   DO NOT smoke tobacco or vape for 24 hours before your procedure.  Please read over the following fact sheets that you were given. Anesthesia Post-op Instructions and Care and Recovery After Surgery      Cystoscopy Cystoscopy is a procedure that is used to help diagnose and sometimes treat conditions that affect the lower urinary tract. The lower urinary tract includes the bladder and the urethra. The urethra is the tube that drains urine from the bladder. Cystoscopy is done using a thin, tube-shaped instrument with a light and camera at the end (cystoscope). The cystoscope may be hard or flexible, depending on the goal of the  procedure. The cystoscope is inserted through the urethra, into the bladder. Cystoscopy may be recommended if you have: Urinary tract infections that keep coming back. Blood in the urine (hematuria). An inability to control when you urinate (urinary incontinence) or an overactive bladder. Unusual cells found in a urine sample. A blockage in the urethra, such as a urinary stone. Painful urination. An abnormality in the bladder found during an intravenous pyelogram (IVP) or CT scan. Cystoscopy may also be done to remove a sample of tissue to be examined under a microscope (biopsy). Tell a health care provider about: Any allergies you have. All medicines you are taking, including vitamins, herbs, eye drops, creams, and over-the-counter medicines. Any problems you or family members have had with anesthetic medicines. Any blood disorders you have. Any surgeries you have had. Any medical conditions you have. Whether you are pregnant or may be pregnant. What are the risks? Generally, this is a safe procedure. However, problems may occur, including: Infection. Bleeding. Allergic reactions to medicines. Damage to other structures or organs. What happens before the procedure? Medicines Ask your health care provider about: Changing or stopping your regular medicines. This is especially important if you are taking diabetes medicines or blood thinners. Taking medicines such as aspirin and ibuprofen. These medicines can thin your blood. Do not take these medicines unless your health care provider tells you to take them.  Taking over-the-counter medicines, vitamins, herbs, and supplements. Tests You may have an exam or testing, such as: X-rays of the bladder, urethra, or kidneys. CT scan of the abdomen or pelvis. Urine tests to check for signs of infection. General instructions Follow instructions from your health care provider about eating or drinking restrictions. Ask your health care provider  what steps will be taken to help prevent infection. These steps may include: Washing skin with a germ-killing soap. Taking antibiotic medicine. Plan to have a responsible adult take you home from the hospital or clinic. What happens during the procedure?  You will be given one or more of the following: A medicine to help you relax (sedative). A medicine to numb the area (local anesthetic). The area around the opening of your urethra will be cleaned. The cystoscope will be passed through your urethra into your bladder. Germ-free (sterile) fluid will flow through the cystoscope to fill your bladder. The fluid will stretch your bladder so that your health care provider can clearly examine your bladder walls. Your doctor will look at the urethra and bladder. Your doctor may take a biopsy or remove stones. The cystoscope will be removed, and your bladder will be emptied. The procedure may vary among health care providers and hospitals. What can I expect after the procedure? After the procedure, it is common to have: Some soreness or pain in your abdomen and urethra. Urinary symptoms. These include: Mild pain or burning when you urinate. Pain should stop within a few minutes after you urinate. This may last for up to 1 week. A small amount of blood in your urine for several days. Feeling like you need to urinate but producing only a small amount of urine. Follow these instructions at home: Medicines Take over-the-counter and prescription medicines only as told by your health care provider. If you were prescribed an antibiotic medicine, take it as told by your health care provider. Do not stop taking the antibiotic even if you start to feel better. General instructions Return to your normal activities as told by your health care provider. Ask your health care provider what activities are safe for you. If you were given a sedative during the procedure, it can affect you for several hours. Do not  drive or operate machinery until your health care provider says that it is safe. Watch for any blood in your urine. If the amount of blood in your urine increases, call your health care provider. Follow instructions from your health care provider about eating or drinking restrictions. If a tissue sample was removed for testing (biopsy) during your procedure, it is up to you to get your test results. Ask your health care provider, or the department that is doing the test, when your results will be ready. Drink enough fluid to keep your urine pale yellow. Keep all follow-up visits. This is important. Contact a health care provider if: You have pain that gets worse or does not get better with medicine, especially pain when you urinate. You have trouble urinating. You have more blood in your urine. Get help right away if: You have blood clots in your urine. You have abdominal pain. You have a fever or chills. You are unable to urinate. Summary Cystoscopy is a procedure that is used to help diagnose and sometimes treat conditions that affect the lower urinary tract. Cystoscopy is done using a thin, tube-shaped instrument with a light and camera at the end. After the procedure, it is common to have some soreness  or pain in your abdomen and urethra. Watch for any blood in your urine. If the amount of blood in your urine increases, call your health care provider. If you were prescribed an antibiotic medicine, take it as told by your health care provider. Do not stop taking the antibiotic even if you start to feel better. This information is not intended to replace advice given to you by your health care provider. Make sure you discuss any questions you have with your health care provider. Document Revised: 06/03/2021 Document Reviewed: 05/02/2020 Elsevier Patient Education  [employer]. How to Use Chlorhexidine for Bathing Chlorhexidine gluconate (CHG) is a germ-killing (antiseptic) solution  that is used to clean the skin. It can get rid of the bacteria that normally live on the skin and can keep them away for about 24 hours. To clean your skin with CHG, you may be given: A CHG solution to use in the shower or as part of a sponge bath. A prepackaged cloth that contains CHG. Cleaning your skin with CHG may help lower the risk for infection: While you are staying in the intensive care unit of the hospital. If you have a vascular access, such as a central line, to provide short-term or long-term access to your veins. If you have a catheter to drain urine from your bladder. If you are on a ventilator. A ventilator is a machine that helps you breathe by moving air in and out of your lungs. After surgery. What are the risks? Risks of using CHG include: A skin reaction. Hearing loss, if CHG gets in your ears and you have a perforated eardrum. Eye injury, if CHG gets in your eyes and is not rinsed out. The CHG product catching fire. Make sure that you avoid smoking and flames after applying CHG to your skin. Do not use CHG: If you have a chlorhexidine allergy or have previously reacted to chlorhexidine. On babies younger than 24 months of age. How to use CHG solution Use CHG only as told by your health care provider, and follow the instructions on the label. Use the full amount of CHG as directed. Usually, this is one bottle. During a shower Follow these steps when using CHG solution during a shower (unless your health care provider gives you different instructions): Start the shower. Use your normal soap and shampoo to wash your face and hair. Turn off the shower or move out of the shower stream. Pour the CHG onto a clean washcloth. Do not use any type of brush or rough-edged sponge. Starting at your neck, lather your body down to your toes. Make sure you follow these instructions: If you will be having surgery, pay special attention to the part of your body where you will be having  surgery. Scrub this area for at least 1 minute. Do not use CHG on your head or face. If the solution gets into your ears or eyes, rinse them well with water. Avoid your genital area. Avoid any areas of skin that have broken skin, cuts, or scrapes. Scrub your back and under your arms. Make sure to wash skin folds. Let the lather sit on your skin for 1-2 minutes or as long as told by your health care provider. Thoroughly rinse your entire body in the shower. Make sure that all body creases and crevices are rinsed well. Dry off with a clean towel. Do not put any substances on your body afterward--such as powder, lotion, or perfume--unless you are told to do  so by your health care provider. Only use lotions that are recommended by the manufacturer. Put on clean clothes or pajamas. If it is the night before your surgery, sleep in clean sheets.  During a sponge bath Follow these steps when using CHG solution during a sponge bath (unless your health care provider gives you different instructions): Use your normal soap and shampoo to wash your face and hair. Pour the CHG onto a clean washcloth. Starting at your neck, lather your body down to your toes. Make sure you follow these instructions: If you will be having surgery, pay special attention to the part of your body where you will be having surgery. Scrub this area for at least 1 minute. Do not use CHG on your head or face. If the solution gets into your ears or eyes, rinse them well with water. Avoid your genital area. Avoid any areas of skin that have broken skin, cuts, or scrapes. Scrub your back and under your arms. Make sure to wash skin folds. Let the lather sit on your skin for 1-2 minutes or as long as told by your health care provider. Using a different clean, wet washcloth, thoroughly rinse your entire body. Make sure that all body creases and crevices are rinsed well. Dry off with a clean towel. Do not put any substances on your body  afterward--such as powder, lotion, or perfume--unless you are told to do so by your health care provider. Only use lotions that are recommended by the manufacturer. Put on clean clothes or pajamas. If it is the night before your surgery, sleep in clean sheets. How to use CHG prepackaged cloths Only use CHG cloths as told by your health care provider, and follow the instructions on the label. Use the CHG cloth on clean, dry skin. Do not use the CHG cloth on your head or face unless your health care provider tells you to. When washing with the CHG cloth: Avoid your genital area. Avoid any areas of skin that have broken skin, cuts, or scrapes. Before surgery Follow these steps when using a CHG cloth to clean before surgery (unless your health care provider gives you different instructions): Using the CHG cloth, vigorously scrub the part of your body where you will be having surgery. Scrub using a back-and-forth motion for 3 minutes. The area on your body should be completely wet with CHG when you are done scrubbing. Do not rinse. Discard the cloth and let the area air-dry. Do not put any substances on the area afterward, such as powder, lotion, or perfume. Put on clean clothes or pajamas. If it is the night before your surgery, sleep in clean sheets.  For general bathing Follow these steps when using CHG cloths for general bathing (unless your health care provider gives you different instructions). Use a separate CHG cloth for each area of your body. Make sure you wash between any folds of skin and between your fingers and toes. Wash your body in the following order, switching to a new cloth after each step: The front of your neck, shoulders, and chest. Both of your arms, under your arms, and your hands. Your stomach and groin area, avoiding the genitals. Your right leg and foot. Your left leg and foot. The back of your neck, your back, and your buttocks. Do not rinse. Discard the cloth and  let the area air-dry. Do not put any substances on your body afterward--such as powder, lotion, or perfume--unless you are told to do so by  your health care provider. Only use lotions that are recommended by the manufacturer. Put on clean clothes or pajamas. Contact a health care provider if: Your skin gets irritated after scrubbing. You have questions about using your solution or cloth. You swallow any chlorhexidine. Call your local poison control center (1-(480)077-0349 in the [place] Get help right away if: Your eyes itch badly, or they become very red or swollen. Your skin itches badly and is red or swollen. Your hearing changes. You have trouble seeing. You have swelling or tingling in your mouth or throat. You have trouble breathing. These symptoms may represent a serious problem that is an emergency. Do not wait to see if the symptoms will go away. Get medical help right away. Call your local emergency services (911 in the [place] Do not drive yourself to the hospital. Summary Chlorhexidine gluconate (CHG) is a germ-killing (antiseptic) solution that is used to clean the skin. Cleaning your skin with CHG may help to lower your risk for infection. You may be given CHG to use for bathing. It may be in a bottle or in a prepackaged cloth to use on your skin. Carefully follow your health care provider's instructions and the instructions on the product label. Do not use CHG if you have a chlorhexidine allergy. Contact your health care provider if your skin gets irritated after scrubbing. This information is not intended to replace advice given to you by your health care provider. Make sure you discuss any questions you have with your health care provider. Document Revised: 12/01/2020 Document Reviewed: 12/01/2020 Elsevier Patient Education  [place].

## 2022-02-23 ENCOUNTER — Encounter (HOSPITAL_COMMUNITY)
Admission: RE | Admit: 2022-02-23 | Discharge: 2022-02-23 | Disposition: A | Discharge status: Home / Self Care | Payer: Medicaid Other | Source: Ambulatory Visit | Attending: Urology | Admitting: Urology

## 2022-02-23 VITALS — BP 127/54 | HR 72 | Temp 98.4°F | Resp 18 | Ht 66.0 in | Wt 224.0 lb

## 2022-02-23 DIAGNOSIS — Z01812 Encounter for preprocedural laboratory examination: Secondary | ICD-10-CM | POA: Insufficient documentation

## 2022-02-23 DIAGNOSIS — D649 Anemia, unspecified: Secondary | ICD-10-CM | POA: Diagnosis not present

## 2022-02-23 DIAGNOSIS — Z794 Long term (current) use of insulin: Secondary | ICD-10-CM | POA: Diagnosis not present

## 2022-02-23 DIAGNOSIS — E119 Type 2 diabetes mellitus without complications: Secondary | ICD-10-CM | POA: Insufficient documentation

## 2022-02-23 LAB — BASIC METABOLIC PANEL
Anion gap: 3 — ABNORMAL LOW (ref 5–15)
BUN: 7 mg/dL (ref 6–20)
CO2: 27 mmol/L (ref 22–32)
Calcium: 8.8 mg/dL — ABNORMAL LOW (ref 8.9–10.3)
Chloride: 104 mmol/L (ref 98–111)
Creatinine, Ser: 0.84 mg/dL (ref 0.61–1.24)
GFR, Estimated: >60 mL/min (ref >60)
Glucose, Bld: 71 mg/dL (ref 70–99)
Potassium: 5.1 mmol/L (ref 3.5–5.1)
Sodium: 134 mmol/L — ABNORMAL LOW (ref 135–145)

## 2022-02-23 LAB — CBC
HCT: 38 % — ABNORMAL LOW (ref 39.0–52.0)
Hemoglobin: 12.7 g/dL — ABNORMAL LOW (ref 13.0–17.0)
MCH: 33.9 pg (ref 26.0–34.0)
MCHC: 33.4 g/dL (ref 30.0–36.0)
MCV: 101.3 fL — ABNORMAL HIGH (ref 80.0–100.0)
Platelets: 62 10*3/uL — ABNORMAL LOW (ref 150–400)
RBC: 3.75 MIL/uL — ABNORMAL LOW (ref 4.22–5.81)
RDW: 15.4 % (ref 11.5–15.5)
WBC: 3.4 10*3/uL — ABNORMAL LOW (ref 4.0–10.5)
nRBC: 0 % (ref 0.0–0.2)

## 2022-02-23 LAB — HEMOGLOBIN A1C
Hgb A1c MFr Bld: 5.2 % (ref 4.8–5.6)
Mean Plasma Glucose: 102.54 mg/dL

## 2022-03-03 ENCOUNTER — Ambulatory Visit (INDEPENDENT_AMBULATORY_CARE_PROVIDER_SITE_OTHER): Payer: Medicaid Other | Admitting: Urology

## 2022-03-03 VITALS — BP 106/74 | HR 70

## 2022-03-03 DIAGNOSIS — R300 Dysuria: Secondary | ICD-10-CM | POA: Diagnosis not present

## 2022-03-03 DIAGNOSIS — R3915 Urgency of urination: Secondary | ICD-10-CM

## 2022-03-03 DIAGNOSIS — Z87440 Personal history of urinary (tract) infections: Secondary | ICD-10-CM | POA: Diagnosis not present

## 2022-03-03 DIAGNOSIS — R350 Frequency of micturition: Secondary | ICD-10-CM

## 2022-03-03 DIAGNOSIS — N390 Urinary tract infection, site not specified: Secondary | ICD-10-CM

## 2022-03-03 LAB — URINALYSIS, ROUTINE W REFLEX MICROSCOPIC
Bilirubin, UA: NEGATIVE
Glucose, UA: NEGATIVE
Ketones, UA: NEGATIVE
Nitrite, UA: POSITIVE — AB
Specific Gravity, UA: >1.03 — ABNORMAL HIGH (ref 1.005–1.030)
Urobilinogen, Ur: 0.2 mg/dL (ref 0.2–1.0)
pH, UA: 5.5 (ref 5.0–7.5)

## 2022-03-03 LAB — MICROSCOPIC EXAMINATION
Renal Epithel, UA: NONE SEEN /hpf
WBC, UA: >30 /hpf — AB (ref 0–5)

## 2022-03-03 MED ORDER — DOXYCYCLINE HYCLATE 100 MG PO CAPS: 100.0000 mg | ORAL_CAPSULE | Freq: Two times a day (BID) | ORAL | 0 refills | Status: DC

## 2022-03-03 NOTE — Progress Notes (Signed)
03/03/2022 9:55 AM   [name] May 05, 1966 [id number]  Referring provider: [name], MD [address] [place],  [place] [address]  dysuria   HPI: [name] is a 56yo here with new onset dysuria for the past 2 days. He has worsening urinary urgency and frequency.  UA concerning for infection. He is awaiting surgery for a urethral stricture.    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[place])    right, s/p replacement, due to prednisone   Bipolar disorder ([place])    Colostomy in place [facility])    Crohn's colitis ([place])    s/p total colectomy with ileostomy in 2009   Crohn's disease of small intestine [facility]) Sept 2012   ileoscopy: multiple ulcers likely secondary to [place], HX OF 02/11/2009   Qualifier: Diagnosis of  By: Zeb Comfort     Depression    DVT (deep venous thrombosis) ([place]) 2009   right upper extremity due to PICC   Enterococcus UTI 2009   GERD (gastroesophageal reflux disease)    Hernia    Hyperlipidemia    Insomnia    Migraine headache    Nystagmus    PULMONARY EMBOLISM, HX OF 02/11/2009   Qualifier: Diagnosis of  By: Zeb Comfort     S/P endoscopy Sept 2012   mild gastritis, small hiatal hernia, no ulcers   Schizophrenia ([place])    Schizophrenia, acute ([place])     Surgical History: Past Surgical History:  Procedure Laterality Date   APPENDECTOMY     ESOPHAGOGASTRODUODENOSCOPY  07/2010   gastritis,  bx neg for H.Pylori   ESOPHAGOGASTRODUODENOSCOPY N/A 01/08/2014   SLF:NO SOURCE FOR ODYNOPHAGIA IDENTIFIED/Multiple small ulcers in the gastric antrum   EYE SURGERY     ILEOSCOPY  06/29/2011   SLF: ulcers,multiple/small HH/mild gastritis   KIDNEY SURGERY     LAPAROTOMY N/A 06/05/2013   Procedure: EXPLORATORY LAPAROTOMY;  Surgeon: Donato Heinz, MD;  Location: [facility];  Service: General;  Laterality: N/A;   LYSIS OF ADHESION N/A 06/05/2013   Procedure: LYSIS OF  ADHESIONS;  Surgeon: Donato Heinz, MD;  Location: [facility];  Service: General;  Laterality: N/A;   small bowel capsule study  08/2010   few tiny nonbleeding erosions/ulcers ?secondary to relafen or Crohn's. Entire small bowel not seen.    TOTAL COLECTOMY  2009   with ileostomy at [facility] for refractory disease    TOTAL HIP ARTHROPLASTY     right, due to avascular necrosis from chronic prednisone   TOTAL SHOULDER REPLACEMENT     bilateral   TRANSURETHRAL RESECTION OF PROSTATE N/A 12/03/2019   Procedure: TRANSURETHRAL RESECTION OF THE PROSTATE (TURP);  Surgeon: Cleon Gustin, MD;  Location: [facility];  Service: Urology;  Laterality: N/A;    Home Medications:  Allergies as of 03/03/2022       Reactions   Penicillins Anaphylaxis   Bactrim [sulfamethoxazole-trimethoprim] Other (See Comments)   ABD CRAMPS AND DIARRHEA Welps/itch   Oxycodone-acetaminophen Other (See Comments)   Other reaction(s): Swelling   Remicade [infliximab] Other (See Comments)   COULDN'T BREATHE   Aspirin    REACTION: unknown reaction   Codeine    REACTION: unknown reaction   Ibuprofen    REACTION: unknown reaction   Macrobid [nitrofurantoin] Itching   Tramadol Nausea Only   Other reaction(s): Nausea   Tramadol Hcl    REACTION:  unknown reaction   Vicodin [hydrocodone-acetaminophen] Nausea Only        Medication List        Accurate as of Mar 03, 2022  9:55 AM. If you have any questions, ask your nurse or doctor.          Accu-Chek Aviva Plus test strip Generic drug: glucose blood SMARTSIG:Via Meter   Active Life 1-Pc Drain 19-64mm Pouch Misc   albuterol (2.5 MG/3ML) 0.083% nebulizer solution Commonly known as: PROVENTIL Take 2.5 mg by nebulization every 6 (six) hours as needed for wheezing or shortness of breath.   albuterol 108 (90 Base) MCG/ACT inhaler Commonly known as: VENTOLIN HFA Inhale 2 puffs into the lungs every 6 (six) hours as needed for wheezing or shortness of breath.    atorvastatin 80 MG tablet Commonly known as: LIPITOR Take 80 mg by mouth daily.   azelastine 0.1 % nasal spray Commonly known as: ASTELIN Place 2 sprays into both nostrils 2 (two) times daily.   baclofen 10 MG tablet Commonly known as: LIORESAL Take 10 mg by mouth daily.   benztropine 0.5 MG tablet Commonly known as: COGENTIN Take 0.5 mg by mouth daily.   Calcium 600/Vitamin D3 600-20 MG-MCG Tabs Generic drug: Calcium Carb-Cholecalciferol TAKE (1) TABLET BY MOUTH THREE TIMES DAILY AFTER MEALS.   cyanocobalamin 1000 MCG tablet Take 1 tablet by mouth daily.   dicyclomine 10 MG capsule Commonly known as: BENTYL TAKE 1 CAPSULE BY MOUTH BEFORE MEALS AND AT BEDTIME AS NEEDED FOR ABDOMINAL CRAMPING. *HOLD FOR CONSTIPATION*   divalproex 250 MG DR tablet Commonly known as: DEPAKOTE Take by mouth at bedtime.   divalproex 500 MG DR tablet Commonly known as: DEPAKOTE Take 500 mg by mouth 2 (two) times daily.   docusate sodium 100 MG capsule Commonly known as: COLACE Take 1 capsule (100 mg total) by mouth 2 (two) times daily.   doxycycline 100 MG capsule Commonly known as: VIBRAMYCIN Take 1 capsule (100 mg total) by mouth every 12 (twelve) hours.   Dulera 200-5 MCG/ACT Aero Generic drug: mometasone-formoterol Inhale 2 puffs into the lungs in the morning and at bedtime.   Eliquis 5 MG Tabs tablet Generic drug: apixaban Take 5 mg by mouth 2 (two) times daily.   ferrous sulfate 325 (65 FE) MG EC tablet Take 1 tablet (325 mg total) by mouth daily with breakfast.   Flovent HFA 110 MCG/ACT inhaler Generic drug: fluticasone Inhale 2 puffs into the lungs 2 (two) times daily.   folic acid 1 MG tablet Commonly known as: FOLVITE Take 1 tablet by mouth daily.   furosemide 40 MG tablet Commonly known as: LASIX Take 40 mg by mouth daily.   gabapentin 300 MG capsule Commonly known as: NEURONTIN Take 900 mg by mouth 3 (three) times daily.   hydroxychloroquine 200 MG  tablet Commonly known as: PLAQUENIL Take 1 tablet by mouth 2 (two) times daily.   meloxicam 7.5 MG tablet Commonly known as: MOBIC Take 7.5 mg by mouth daily.   metoprolol succinate 25 MG 24 hr tablet Commonly known as: TOPROL-XL Take 1 tablet (25 mg total) by mouth in the morning and at bedtime. Take with or immediately following a meal.   nitrofurantoin (macrocrystal-monohydrate) 100 MG capsule Commonly known as: MACROBID Take 1 capsule (100 mg total) by mouth 2 (two) times daily.   nitroGLYCERIN 0.4 MG SL tablet Commonly known as: NITROSTAT Place 1 tablet (0.4 mg total) under the tongue every 5 (five) minutes as needed for chest pain.  pantoprazole 40 MG tablet Commonly known as: PROTONIX TAKE (1) TABLET BY MOUTH TWICE A DAY BEFORE MEALS. (BREAKFAST AND SUPPER)   predniSONE 20 MG tablet Commonly known as: DELTASONE Take by mouth.   risperiDONE 3 MG tablet Commonly known as: RISPERDAL Take 3 mg by mouth 2 (two) times daily.   sertraline 50 MG tablet Commonly known as: ZOLOFT Take 50 mg by mouth daily.   Spiriva Respimat 2.5 MCG/ACT Aers Generic drug: Tiotropium Bromide Monohydrate Inhale 2 puffs into the lungs daily.   tamsulosin 0.4 MG Caps capsule Commonly known as: FLOMAX Take 0.4 mg by mouth daily.   traMADol 50 MG tablet Commonly known as: ULTRAM 1 tablet as needed   traZODone 100 MG tablet Commonly known as: DESYREL Take 100 mg by mouth at bedtime.   Walker Wheels Misc USE AS DIRECTED        Allergies:  Allergies  Allergen Reactions   Penicillins Anaphylaxis   Bactrim [Sulfamethoxazole-Trimethoprim] Other (See Comments)    ABD CRAMPS AND DIARRHEA Welps/itch   Oxycodone-Acetaminophen Other (See Comments)    Other reaction(s): Swelling   Remicade [Infliximab] Other (See Comments)    COULDN'T BREATHE   Aspirin     REACTION: unknown reaction   Codeine     REACTION: unknown reaction   Ibuprofen     REACTION: unknown reaction   Macrobid  [Nitrofurantoin] Itching   Tramadol Nausea Only    Other reaction(s): Nausea   Tramadol Hcl     REACTION: unknown reaction   Vicodin [Hydrocodone-Acetaminophen] Nausea Only    Family History: Family History  Problem Relation Age of Onset   Diabetes Mother    Heart disease Mother    Diabetes Father    Heart disease Father    COPD Maternal Grandmother    Colon cancer Neg Hx     Social History:  reports that he has been smoking cigarettes. He has a 148.00 pack-year smoking history. He has never used smokeless tobacco. He reports that he does not drink alcohol and does not use drugs.  ROS: All other review of systems were reviewed and are negative except what is noted above in HPI  Physical Exam: BP 106/74   Pulse 70   Constitutional:  Alert and oriented, No acute distress. HEENT: [place] AT, moist mucus membranes.  Trachea midline, no masses. Cardiovascular: No clubbing, cyanosis, or edema. Respiratory: Normal respiratory effort, no increased work of breathing. GI: Abdomen is soft, nontender, nondistended, no abdominal masses GU: No CVA tenderness.  Lymph: No cervical or inguinal lymphadenopathy. Skin: No rashes, bruises or suspicious lesions. Neurologic: Grossly intact, no focal deficits, moving all 4 extremities. Psychiatric: Normal mood and affect.  Laboratory Data: Lab Results  Component Value Date   WBC 3.4 (L) 02/23/2022   HGB 12.7 (L) 02/23/2022   HCT 38.0 (L) 02/23/2022   MCV 101.3 (H) 02/23/2022   PLT 62 (L) 02/23/2022    Lab Results  Component Value Date   CREATININE 0.84 02/23/2022    No results found for: PSA  No results found for: TESTOSTERONE  Lab Results  Component Value Date   HGBA1C 5.2 02/23/2022    Urinalysis    Component Value Date/Time   COLORURINE YELLOW 05/16/2021 1411   APPEARANCEUR Cloudy (A) 02/03/2022 1017   LABSPEC 1.016 05/16/2021 1411   PHURINE 6.0 05/16/2021 1411   GLUCOSEU Negative 02/03/2022 1017   HGBUR SMALL (A)  05/16/2021 1411   BILIRUBINUR Negative 02/03/2022 [place] 05/16/2021 1411   PROTEINUR  2+ (A) 02/03/2022 1017   PROTEINUR NEGATIVE 05/16/2021 1411   UROBILINOGEN 0.2 10/17/2014 1940   NITRITE Negative 02/03/2022 1017   NITRITE POSITIVE (A) 05/16/2021 1411   LEUKOCYTESUR Trace (A) 02/03/2022 1017   LEUKOCYTESUR TRACE (A) 05/16/2021 1411    Lab Results  Component Value Date   LABMICR See below: 02/03/2022   WBCUA 0-5 02/03/2022   LABEPIT None seen 02/03/2022   MUCUS Present 02/03/2022   BACTERIA Few 02/03/2022    Pertinent Imaging:  No results found for this or any previous visit.  No results found for this or any previous visit.  No results found for this or any previous visit.  No results found for this or any previous visit.  Results for orders placed during the hospital encounter of 04/27/16  US Renal  Narrative CLINICAL DATA:  Bladder neck alpha obstruction. Recurrent UTIs. History of Crohn's disease. Colostomy. EXAM: RENAL / URINARY TRACT ULTRASOUND COMPLETE COMPARISON:  CT 03/05/2014 . FINDINGS: Right Kidney: Length: 12.6 cm. Echogenicity within normal limits. No mass or hydronephrosis visualized. Left Kidney: Length: 11.9 cm. Echogenicity within normal limits. No mass or hydronephrosis visualized. Bladder: Appears normal for degree of bladder distention. Bilateral ureteral jets noted. IMPRESSION: No acute or focal abnormality. Electronically Signed By: Marcello Moores  Register On: 04/27/2016 13:37  No results found for this or any previous visit.  No results found for this or any previous visit.  No results found for this or any previous visit.   Assessment & Plan:    1. Frequent UTI -Urine for culture -doxycycline 168m BID for 7 days - Urinalysis, Routine w reflex microscopic   No follow-ups on file.  PNicolette Bang MD  [facility] [place]

## 2022-03-09 ENCOUNTER — Encounter: Payer: Self-pay | Admitting: Urology

## 2022-03-09 NOTE — Patient Instructions (Signed)
Urinary Tract Infection, Adult A urinary tract infection (UTI) is an infection of any part of the urinary tract. The urinary tract includes: The kidneys. The ureters. The bladder. The urethra. These organs make, store, and get rid of pee (urine) in the body. What are the causes? This infection is caused by germs (bacteria) in your genital area. These germs grow and cause swelling (inflammation) of your urinary tract. What increases the risk? The following factors may make you more likely to develop this condition: Using a small, thin tube (catheter) to drain pee. Not being able to control when you pee or poop (incontinence). Being male. If you are male, these things can increase the risk: Using these methods to prevent pregnancy: A medicine that kills sperm (spermicide). A device that blocks sperm (diaphragm). Having low levels of a male hormone (estrogen). Being pregnant. You are more likely to develop this condition if: You have genes that add to your risk. You are sexually active. You take antibiotic medicines. You have trouble peeing because of: A prostate that is bigger than normal, if you are male. A blockage in the part of your body that drains pee from the bladder. A kidney stone. A nerve condition that affects your bladder. Not getting enough to drink. Not peeing often enough. You have other conditions, such as: Diabetes. A weak disease-fighting system (immune system). Sickle cell disease. Gout. Injury of the spine. What are the signs or symptoms? Symptoms of this condition include: Needing to pee right away. Peeing small amounts often. Pain or burning when peeing. Blood in the pee. Pee that smells bad or not like normal. Trouble peeing. Pee that is cloudy. Fluid coming from the vagina, if you are male. Pain in the belly or lower back. Other symptoms include: Vomiting. Not feeling hungry. Feeling mixed up (confused). This may be the first symptom in  older adults. Being tired and grouchy (irritable). A fever. Watery poop (diarrhea). How is this treated? Taking antibiotic medicine. Taking other medicines. Drinking enough water. In some cases, you may need to see a specialist. Follow these instructions at home:  Medicines Take over-the-counter and prescription medicines only as told by your doctor. If you were prescribed an antibiotic medicine, take it as told by your doctor. Do not stop taking it even if you start to feel better. General instructions Make sure you: Pee until your bladder is empty. Do not hold pee for a long time. Empty your bladder after sex. Wipe from front to back after peeing or pooping if you are a male. Use each tissue one time when you wipe. Drink enough fluid to keep your pee pale yellow. Keep all follow-up visits. Contact a doctor if: You do not get better after 1-2 days. Your symptoms go away and then come back. Get help right away if: You have very bad back pain. You have very bad pain in your lower belly. You have a fever. You have chills. You feeling like you will vomit or you vomit. Summary A urinary tract infection (UTI) is an infection of any part of the urinary tract. This condition is caused by germs in your genital area. There are many risk factors for a UTI. Treatment includes antibiotic medicines. Drink enough fluid to keep your pee pale yellow. This information is not intended to replace advice given to you by your health care provider. Make sure you discuss any questions you have with your health care provider. Document Revised: 05/02/2020 Document Reviewed: 05/02/2020 Elsevier Patient Education    2023 [employer]  

## 2022-03-10 ENCOUNTER — Other Ambulatory Visit: Payer: Self-pay

## 2022-03-10 ENCOUNTER — Encounter (HOSPITAL_COMMUNITY): Payer: Self-pay | Admitting: Emergency Medicine

## 2022-03-10 ENCOUNTER — Emergency Department (HOSPITAL_COMMUNITY): Payer: Medicaid Other

## 2022-03-10 ENCOUNTER — Emergency Department (HOSPITAL_COMMUNITY)
Admission: EM | Admit: 2022-03-10 | Discharge: 2022-03-10 | Disposition: A | Discharge status: Home / Self Care | Payer: Medicaid Other | Attending: Emergency Medicine | Admitting: Emergency Medicine

## 2022-03-10 DIAGNOSIS — R600 Localized edema: Secondary | ICD-10-CM | POA: Insufficient documentation

## 2022-03-10 DIAGNOSIS — E119 Type 2 diabetes mellitus without complications: Secondary | ICD-10-CM | POA: Insufficient documentation

## 2022-03-10 DIAGNOSIS — M79605 Pain in left leg: Secondary | ICD-10-CM | POA: Diagnosis present

## 2022-03-10 DIAGNOSIS — Z7901 Long term (current) use of anticoagulants: Secondary | ICD-10-CM | POA: Diagnosis not present

## 2022-03-10 LAB — CBC WITH DIFFERENTIAL/PLATELET
Abs Immature Granulocytes: 0.01 10*3/uL (ref 0.00–0.07)
Basophils Absolute: 0 10*3/uL (ref 0.0–0.1)
Basophils Relative: 1 %
Eosinophils Absolute: 0.1 10*3/uL (ref 0.0–0.5)
Eosinophils Relative: 3 %
HCT: 42.7 % (ref 39.0–52.0)
Hemoglobin: 14.6 g/dL (ref 13.0–17.0)
Immature Granulocytes: 0 %
Lymphocytes Relative: 34 %
Lymphs Abs: 1.1 10*3/uL (ref 0.7–4.0)
MCH: 33.8 pg (ref 26.0–34.0)
MCHC: 34.2 g/dL (ref 30.0–36.0)
MCV: 98.8 fL (ref 80.0–100.0)
Monocytes Absolute: 0.5 10*3/uL (ref 0.1–1.0)
Monocytes Relative: 16 %
Neutro Abs: 1.5 10*3/uL — ABNORMAL LOW (ref 1.7–7.7)
Neutrophils Relative %: 46 %
Platelets: 64 10*3/uL — ABNORMAL LOW (ref 150–400)
RBC: 4.32 MIL/uL (ref 4.22–5.81)
RDW: 14 % (ref 11.5–15.5)
WBC: 3.2 10*3/uL — ABNORMAL LOW (ref 4.0–10.5)
nRBC: 0 % (ref 0.0–0.2)

## 2022-03-10 LAB — COMPREHENSIVE METABOLIC PANEL
ALT: 35 U/L (ref 0–44)
AST: 68 U/L — ABNORMAL HIGH (ref 15–41)
Albumin: 3.1 g/dL — ABNORMAL LOW (ref 3.5–5.0)
Alkaline Phosphatase: 114 U/L (ref 38–126)
Anion gap: 4 — ABNORMAL LOW (ref 5–15)
BUN: 12 mg/dL (ref 6–20)
CO2: 28 mmol/L (ref 22–32)
Calcium: 8.6 mg/dL — ABNORMAL LOW (ref 8.9–10.3)
Chloride: 103 mmol/L (ref 98–111)
Creatinine, Ser: 0.86 mg/dL (ref 0.61–1.24)
GFR, Estimated: >60 mL/min (ref >60)
Glucose, Bld: 83 mg/dL (ref 70–99)
Potassium: 4.6 mmol/L (ref 3.5–5.1)
Sodium: 135 mmol/L (ref 135–145)
Total Bilirubin: 1.1 mg/dL (ref 0.3–1.2)
Total Protein: 6.5 g/dL (ref 6.5–8.1)

## 2022-03-10 MED ORDER — FENTANYL CITRATE PF 50 MCG/ML IJ SOSY
50.0000 ug | PREFILLED_SYRINGE | Freq: Once | INTRAMUSCULAR | Status: AC
  Administered 2022-03-10: 50 ug via INTRAVENOUS
  Filled 2022-03-10: qty 1

## 2022-03-10 NOTE — ED Provider Notes (Signed)
[facility] EMERGENCY DEPARTMENT Provider Note   CSN: [id number] Arrival date & time: 03/10/22  9702     History  Chief Complaint  Patient presents with   Leg Pain    Leg and feet swelling x2 weeks    [name] is a 56 y.o. male.  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  He has had bilateral leg swelling and pain that has progressively worsened over 2 weeks.  The leg swelling has moved its way up from feet up to towards his knees.  No fevers, numbness.  He is having progressively worsening pain.  There is also some redness.  All of his symptoms are worse in the right though both are swollen.  There is no chest pain, cough, or shortness of breath.  He feels like this is similar to when he had DVTs.  He has been compliant with the Eliquis.  He has tried Tylenol and ibuprofen without relief.  Home Medications Prior to Admission medications   Medication Sig Start Date End Date Taking? Authorizing Provider  ACCU-CHEK AVIVA PLUS test strip SMARTSIG:Via Meter 05/02/20   [provider]  albuterol (PROVENTIL) (2.5 MG/3ML) 0.083% nebulizer solution Take 2.5 mg by nebulization every 6 (six) hours as needed for wheezing or shortness of breath.    [provider]  albuterol (VENTOLIN HFA) 108 (90 Base) MCG/ACT inhaler Inhale 2 puffs into the lungs every 6 (six) hours as needed for wheezing or shortness of breath.    [provider]  atorvastatin (LIPITOR) 80 MG tablet Take 80 mg by mouth daily.    [provider]  azelastine (ASTELIN) 0.1 % nasal spray Place 2 sprays into both nostrils 2 (two) times daily. 05/15/20   [provider]  baclofen (LIORESAL) 10 MG tablet Take 10 mg by mouth daily. 10/04/19   [provider]  benztropine (COGENTIN) 0.5 MG tablet Take 0.5 mg by mouth  daily.     [provider]  CALCIUM 600/VITAMIN D3 600-800 MG-UNIT TABS TAKE (1) TABLET BY MOUTH THREE TIMES DAILY AFTER MEALS. 06/05/21   Mahala Menghini, PA-C  cyanocobalamin 1000 MCG tablet Take 1 tablet by mouth daily.    [provider]  dicyclomine (BENTYL) 10 MG capsule TAKE 1 CAPSULE BY MOUTH BEFORE MEALS AND AT BEDTIME AS NEEDED FOR ABDOMINAL CRAMPING. *HOLD FOR CONSTIPATION* 06/05/21   Mahala Menghini, PA-C  divalproex (DEPAKOTE) 250 MG DR tablet Take by mouth at bedtime. 11/21/20   [provider]  divalproex (DEPAKOTE) 500 MG DR tablet Take 500 mg by mouth 2 (two) times daily. 02/03/22   [provider]  docusate sodium (COLACE) 100 MG capsule Take 1 capsule (100 mg total) by mouth 2 (two) times daily. 02/14/18   Kathie Dike, MD  doxycycline (VIBRAMYCIN) 100 MG capsule Take 1 capsule (100 mg total) by mouth every 12 (twelve) hours. 03/03/22   McKenzie, Candee Furbish, MD  doxycycline (VIBRAMYCIN) 100 MG capsule Take 1 capsule (100 mg total) by mouth every 12 (twelve) hours. 03/03/22   McKenzie, Candee Furbish, MD  ELIQUIS 5 MG TABS tablet Take 5 mg by mouth 2 (two) times daily. 07/14/20   [provider]  ferrous sulfate 325 (65 FE) MG EC tablet Take 1 tablet (325 mg total) by mouth daily with breakfast. 10/07/21   Harriett Rush, PA-C  FLOVENT HFA 110 MCG/ACT  inhaler Inhale 2 puffs into the lungs 2 (two) times daily. 01/19/21   [provider]  folic acid (FOLVITE) 1 MG tablet Take 1 tablet by mouth daily.    [provider]  furosemide (LASIX) 40 MG tablet Take 40 mg by mouth daily.    [provider]  gabapentin (NEURONTIN) 300 MG capsule Take 900 mg by mouth 3 (three) times daily.    [provider]  hydroxychloroquine (PLAQUENIL) 200 MG tablet Take 1 tablet by mouth 2 (two) times daily. 07/06/21   [provider]  meloxicam (MOBIC) 7.5 MG tablet Take 7.5 mg by mouth daily. 11/08/19   [provider]   metoprolol succinate (TOPROL-XL) 25 MG 24 hr tablet Take 1 tablet (25 mg total) by mouth in the morning and at bedtime. Take with or immediately following a meal. 12/08/21   Baldwin Jamaica, PA-C  Misc. Devices (WALKER WHEELS) MISC USE AS DIRECTED 06/06/19   Fields, Marga Melnick, MD  mometasone-formoterol [facility]) 200-5 MCG/ACT AERO Inhale 2 puffs into the lungs in the morning and at bedtime. 01/19/21   Freddi Starr, MD  nitrofurantoin, macrocrystal-monohydrate, (MACROBID) 100 MG capsule Take 1 capsule (100 mg total) by mouth 2 (two) times daily. 01/26/22   McKenzie, Candee Furbish, MD  nitroGLYCERIN (NITROSTAT) 0.4 MG SL tablet Place 1 tablet (0.4 mg total) under the tongue every 5 (five) minutes as needed for chest pain. 01/26/21 07/11/21  Elouise Munroe, MD  Ostomy Supplies (ACTIVE LIFE 1-PC DRAIN [phone]) Pouch MISC  05/21/20   [provider]  pantoprazole (PROTONIX) 40 MG tablet TAKE (1) TABLET BY MOUTH TWICE A DAY BEFORE MEALS. (BREAKFAST AND SUPPER) 08/01/20   Mahala Menghini, PA-C  predniSONE (DELTASONE) 20 MG tablet Take by mouth. 01/18/22   [provider]  risperiDONE (RISPERDAL) 3 MG tablet Take 3 mg by mouth 2 (two) times daily. 05/12/21   [provider]  sertraline (ZOLOFT) 50 MG tablet Take 50 mg by mouth daily.    [provider]  tamsulosin (FLOMAX) 0.4 MG CAPS capsule Take 0.4 mg by mouth daily. 07/03/20   [provider]  Tiotropium Bromide Monohydrate (SPIRIVA RESPIMAT) 2.5 MCG/ACT AERS Inhale 2 puffs into the lungs daily. 01/19/21   Freddi Starr, MD  traMADol Veatrice Bourbon) 50 MG tablet 1 tablet as needed 04/13/21   [provider]  traZODone (DESYREL) 100 MG tablet Take 100 mg by mouth at bedtime. 05/12/21   [provider]      Allergies    Penicillins, Bactrim [sulfamethoxazole-trimethoprim], Oxycodone-acetaminophen, Remicade [infliximab], Aspirin, Codeine, Ibuprofen, Macrobid [nitrofurantoin], Tramadol, Tramadol hcl, and  Vicodin [hydrocodone-acetaminophen]    Review of Systems   Review of Systems  Constitutional:  Negative for fever.  Respiratory:  Negative for cough and shortness of breath.   Cardiovascular:  Positive for leg swelling. Negative for chest pain.  Skin:  Positive for color change.  Neurological:  Negative for weakness and numbness.   Physical Exam Updated Vital Signs BP 119/74   Pulse 69   Temp 97.8 F (36.6 C) (Oral)   Resp 17   Ht 5' 6"  (1.676 m)   Wt 102.1 kg   SpO2 96%   BMI 36.32 kg/m  Physical Exam Vitals and nursing note reviewed.  Constitutional:      General: He is not in acute distress.    Appearance: He is well-developed. He is not ill-appearing or diaphoretic.     Comments: Resting comfortably in stretcher  HENT:  Head: Normocephalic and atraumatic.  Cardiovascular:     Rate and Rhythm: Normal rate and regular rhythm.     Pulses:          Dorsalis pedis pulses are 2+ on the right side and 2+ on the left side.     Heart sounds: Normal heart sounds.  Pulmonary:     Effort: Pulmonary effort is normal.     Breath sounds: Normal breath sounds.  Abdominal:     Palpations: Abdomen is soft.     Tenderness: There is no abdominal tenderness.  Musculoskeletal:     Comments: There is right greater than left foot and lower leg swelling.  The left lower leg does seem to be mildly swollen but not as prominent compared to the right.  There is also some generalized anterior erythema and slight warmth of the right lower leg compared to the left.  There is some mild soreness on palpation but no significant tenderness or intractable pain.  Normal strength and sensation in his feet.  Skin:    General: Skin is warm and dry.  Neurological:     Mental Status: He is alert.    ED Results / Procedures / Treatments   Labs (all labs ordered are listed, but only abnormal results are displayed) Labs Reviewed  COMPREHENSIVE METABOLIC PANEL - Abnormal; Notable for the following  components:      Result Value   Calcium 8.6 (*)    Albumin 3.1 (*)    AST 68 (*)    Anion gap 4 (*)    All other components within normal limits  CBC WITH DIFFERENTIAL/PLATELET - Abnormal; Notable for the following components:   WBC 3.2 (*)    Platelets 64 (*)    Neutro Abs 1.5 (*)    All other components within normal limits    EKG None  Radiology US Venous Img Lower Bilateral  Result Date: 03/10/2022 CLINICAL DATA:  Bilateral leg swelling right-greater-than-left for 1 month, history of DVT. On Eliquis. EXAM: BILATERAL LOWER EXTREMITY VENOUS DOPPLER ULTRASOUND TECHNIQUE: Gray-scale sonography with compression, as well as color and duplex ultrasound, were performed to evaluate the deep venous system(s) from the level of the common femoral vein through the popliteal and proximal calf veins. COMPARISON:  Ultrasound December 02, 2020. FINDINGS: VENOUS Normal compressibility of the common femoral, superficial femoral, and popliteal veins, as well as the visualized calf veins. Visualized portions of profunda femoral vein and great saphenous vein unremarkable. No filling defects to suggest DVT on grayscale or color Doppler imaging. Doppler waveforms show normal direction of venous flow, normal respiratory plasticity and response to augmentation. Limited views of the contralateral common femoral vein are unremarkable. OTHER None. Limitations: none IMPRESSION: No evidence of deep venous thrombosis. Electronically Signed   By: Dahlia Bailiff M.D.   On: 03/10/2022 09:15    Procedures Procedures    Medications Ordered in ED Medications  fentaNYL (SUBLIMAZE) injection 50 mcg (50 mcg Intravenous Given 03/10/22 0809)    ED Course/ Medical Decision Making/ A&P                           Medical Decision Making Amount and/or Complexity of Data Reviewed External Data Reviewed: notes. Labs: ordered. Radiology: ordered and independent interpretation performed.  Risk Prescription drug  management.   Labs show a chronic leukopenia and otherwise no significant liver disease or kidney disease.  DVT ultrasound obtained and there is no DVT.  I personally  viewed/interpreted these images.  There is a little bit of redness on the right leg compared to the left.  Chart review shows he was just on doxycycline within the last week for urinary symptoms and this should have covered his leg.  My suspicion is with normal vitals and normal white blood cell count for him, this probably is more stasis dermatitis.  I do not think he needs further antibiotics.  I think is reasonable to discharge home with supportive care such as compression stockings and elevate his legs.  Follow-up with PCP.  Given return precautions.        Final Clinical Impression(s) / ED Diagnoses Final diagnoses:  Bilateral leg edema    Rx / DC Orders ED Discharge Orders     None         Sherwood Gambler, MD 03/10/22 1527

## 2022-03-10 NOTE — Discharge Instructions (Addendum)
If you develop new or worsening swelling, chest pain, shortness of breath, fever, new or worsening redness, or any other new/concerning symptoms then return to the ER for evaluation.  Be sure to wear compression stockings and follow-up closely with your family doctor.

## 2022-03-10 NOTE — ED Triage Notes (Signed)
BIB EMS, reports feet and legs have been swelling for 2 weeks, states PCP is on vacation. States went to [facility] yesterday and was referred to ER

## 2022-03-10 NOTE — ED Notes (Signed)
ED Provider at bedside. 

## 2022-03-16 ENCOUNTER — Ambulatory Visit (HOSPITAL_COMMUNITY)
Admission: RE | Admit: 2022-03-16 | Discharge: 2022-03-16 | Disposition: A | Discharge status: Home / Self Care | Payer: Medicaid Other | Source: Ambulatory Visit | Attending: Internal Medicine | Admitting: Internal Medicine

## 2022-03-16 DIAGNOSIS — Z87891 Personal history of nicotine dependence: Secondary | ICD-10-CM | POA: Insufficient documentation

## 2022-03-16 DIAGNOSIS — F17210 Nicotine dependence, cigarettes, uncomplicated: Secondary | ICD-10-CM | POA: Diagnosis present

## 2022-03-18 ENCOUNTER — Telehealth: Payer: Self-pay | Admitting: Acute Care

## 2022-03-18 DIAGNOSIS — F17210 Nicotine dependence, cigarettes, uncomplicated: Secondary | ICD-10-CM

## 2022-03-18 DIAGNOSIS — Z122 Encounter for screening for malignant neoplasm of respiratory organs: Secondary | ICD-10-CM

## 2022-03-18 DIAGNOSIS — Z87891 Personal history of nicotine dependence: Secondary | ICD-10-CM

## 2022-03-18 NOTE — Telephone Encounter (Signed)
Called and spoke with pt and advised him that there were no concerning or suspicious lung nodules seen on his CT scan. Also there was a notation of heart calcifications but this is commonly seen on these scans. PT is on Lipitor daily. Also advised that there were some additional findings on the liver. PT reports that he just seen his Gastro Dr yesterday and requests that we send her a copy of CT. Order placed for 12 mth f/u lung screening CT. CT faxed to PCP, Dr [name] and Marlane Mingle, FNP with [facility].

## 2022-03-22 ENCOUNTER — Encounter (HOSPITAL_COMMUNITY)
Admission: RE | Admit: 2022-03-22 | Discharge: 2022-03-22 | Disposition: A | Discharge status: Home / Self Care | Payer: Medicaid Other | Source: Ambulatory Visit | Attending: Urology | Admitting: Urology

## 2022-03-22 ENCOUNTER — Encounter (HOSPITAL_COMMUNITY): Payer: Self-pay

## 2022-03-22 ENCOUNTER — Other Ambulatory Visit: Payer: Self-pay

## 2022-03-24 MED ORDER — GENTAMICIN SULFATE 40 MG/ML IJ SOLN
400.0000 mg | INTRAMUSCULAR | Status: AC
  Administered 2022-03-25: 400 mg via INTRAVENOUS
  Filled 2022-03-24 (×2): qty 10

## 2022-03-25 ENCOUNTER — Observation Stay (HOSPITAL_COMMUNITY)
Admission: RE | Admit: 2022-03-25 | Discharge: 2022-03-27 | Disposition: A | Discharge status: Home / Self Care | Payer: Medicaid Other | Attending: Urology | Admitting: Urology

## 2022-03-25 ENCOUNTER — Encounter (HOSPITAL_COMMUNITY)
Admission: RE | Disposition: A | Discharge status: Home / Self Care | Payer: Self-pay | Source: Home / Self Care | Attending: Urology

## 2022-03-25 ENCOUNTER — Other Ambulatory Visit: Payer: Self-pay

## 2022-03-25 ENCOUNTER — Ambulatory Visit (HOSPITAL_COMMUNITY): Payer: Medicaid Other | Admitting: Anesthesiology

## 2022-03-25 ENCOUNTER — Encounter (HOSPITAL_COMMUNITY): Payer: Self-pay | Admitting: Urology

## 2022-03-25 ENCOUNTER — Ambulatory Visit (HOSPITAL_COMMUNITY): Payer: Medicaid Other

## 2022-03-25 ENCOUNTER — Ambulatory Visit (HOSPITAL_BASED_OUTPATIENT_CLINIC_OR_DEPARTMENT_OTHER): Payer: Medicaid Other | Admitting: Anesthesiology

## 2022-03-25 DIAGNOSIS — Z96612 Presence of left artificial shoulder joint: Secondary | ICD-10-CM | POA: Diagnosis not present

## 2022-03-25 DIAGNOSIS — Z96611 Presence of right artificial shoulder joint: Secondary | ICD-10-CM | POA: Diagnosis not present

## 2022-03-25 DIAGNOSIS — Z7984 Long term (current) use of oral hypoglycemic drugs: Secondary | ICD-10-CM

## 2022-03-25 DIAGNOSIS — W19XXXA Unspecified fall, initial encounter: Secondary | ICD-10-CM

## 2022-03-25 DIAGNOSIS — M1990 Unspecified osteoarthritis, unspecified site: Secondary | ICD-10-CM | POA: Diagnosis not present

## 2022-03-25 DIAGNOSIS — G4700 Insomnia, unspecified: Secondary | ICD-10-CM

## 2022-03-25 DIAGNOSIS — R339 Retention of urine, unspecified: Secondary | ICD-10-CM

## 2022-03-25 DIAGNOSIS — K56600 Partial intestinal obstruction, unspecified as to cause: Secondary | ICD-10-CM

## 2022-03-25 DIAGNOSIS — R109 Unspecified abdominal pain: Secondary | ICD-10-CM

## 2022-03-25 DIAGNOSIS — Z79899 Other long term (current) drug therapy: Secondary | ICD-10-CM | POA: Insufficient documentation

## 2022-03-25 DIAGNOSIS — N390 Urinary tract infection, site not specified: Secondary | ICD-10-CM | POA: Diagnosis not present

## 2022-03-25 DIAGNOSIS — I82409 Acute embolism and thrombosis of unspecified deep veins of unspecified lower extremity: Secondary | ICD-10-CM

## 2022-03-25 DIAGNOSIS — E119 Type 2 diabetes mellitus without complications: Secondary | ICD-10-CM

## 2022-03-25 DIAGNOSIS — Z86711 Personal history of pulmonary embolism: Secondary | ICD-10-CM | POA: Insufficient documentation

## 2022-03-25 DIAGNOSIS — J45909 Unspecified asthma, uncomplicated: Secondary | ICD-10-CM | POA: Insufficient documentation

## 2022-03-25 DIAGNOSIS — Z7901 Long term (current) use of anticoagulants: Secondary | ICD-10-CM | POA: Diagnosis not present

## 2022-03-25 DIAGNOSIS — E785 Hyperlipidemia, unspecified: Secondary | ICD-10-CM

## 2022-03-25 DIAGNOSIS — N320 Bladder-neck obstruction: Secondary | ICD-10-CM

## 2022-03-25 DIAGNOSIS — K760 Fatty (change of) liver, not elsewhere classified: Secondary | ICD-10-CM

## 2022-03-25 DIAGNOSIS — Z86718 Personal history of other venous thrombosis and embolism: Secondary | ICD-10-CM | POA: Diagnosis not present

## 2022-03-25 DIAGNOSIS — F319 Bipolar disorder, unspecified: Secondary | ICD-10-CM

## 2022-03-25 DIAGNOSIS — N400 Enlarged prostate without lower urinary tract symptoms: Secondary | ICD-10-CM

## 2022-03-25 DIAGNOSIS — K50819 Crohn's disease of both small and large intestine with unspecified complications: Secondary | ICD-10-CM

## 2022-03-25 DIAGNOSIS — N401 Enlarged prostate with lower urinary tract symptoms: Secondary | ICD-10-CM

## 2022-03-25 DIAGNOSIS — R197 Diarrhea, unspecified: Secondary | ICD-10-CM

## 2022-03-25 DIAGNOSIS — M179 Osteoarthritis of knee, unspecified: Secondary | ICD-10-CM

## 2022-03-25 DIAGNOSIS — M8700 Idiopathic aseptic necrosis of unspecified bone: Secondary | ICD-10-CM

## 2022-03-25 DIAGNOSIS — F17210 Nicotine dependence, cigarettes, uncomplicated: Secondary | ICD-10-CM | POA: Insufficient documentation

## 2022-03-25 DIAGNOSIS — K56609 Unspecified intestinal obstruction, unspecified as to partial versus complete obstruction: Secondary | ICD-10-CM

## 2022-03-25 DIAGNOSIS — H5500 Unspecified nystagmus: Secondary | ICD-10-CM

## 2022-03-25 DIAGNOSIS — E872 Acidosis, unspecified: Secondary | ICD-10-CM

## 2022-03-25 DIAGNOSIS — K219 Gastro-esophageal reflux disease without esophagitis: Secondary | ICD-10-CM

## 2022-03-25 DIAGNOSIS — Z9189 Other specified personal risk factors, not elsewhere classified: Secondary | ICD-10-CM

## 2022-03-25 DIAGNOSIS — E871 Hypo-osmolality and hyponatremia: Secondary | ICD-10-CM

## 2022-03-25 DIAGNOSIS — K433 Parastomal hernia with obstruction, without gangrene: Secondary | ICD-10-CM

## 2022-03-25 DIAGNOSIS — Z96641 Presence of right artificial hip joint: Secondary | ICD-10-CM | POA: Diagnosis not present

## 2022-03-25 DIAGNOSIS — G9341 Metabolic encephalopathy: Secondary | ICD-10-CM

## 2022-03-25 DIAGNOSIS — N138 Other obstructive and reflux uropathy: Secondary | ICD-10-CM

## 2022-03-25 HISTORY — PX: CYSTOSCOPY WITH DIRECT VISION INTERNAL URETHROTOMY: SHX6637

## 2022-03-25 LAB — GLUCOSE, CAPILLARY
Glucose-Capillary: 100 mg/dL — ABNORMAL HIGH (ref 70–99)
Glucose-Capillary: 69 mg/dL — ABNORMAL LOW (ref 70–99)
Glucose-Capillary: 115 mg/dL — ABNORMAL HIGH (ref 70–99)

## 2022-03-25 SURGERY — CYSTOSCOPY, WITH DIRECT VISION INTERNAL URETHROTOMY
Anesthesia: General | Site: Ureter

## 2022-03-25 MED ORDER — ACETAMINOPHEN 325 MG PO TABS: 650.0000 mg | ORAL_TABLET | ORAL | Status: DC | PRN

## 2022-03-25 MED ORDER — RISPERIDONE 1 MG PO TABS
3.0000 mg | ORAL_TABLET | Freq: Two times a day (BID) | ORAL | Status: DC
  Administered 2022-03-25 – 2022-03-27 (×4): 3 mg via ORAL
  Filled 2022-03-25 (×4): qty 3

## 2022-03-25 MED ORDER — FENTANYL CITRATE (PF) 100 MCG/2ML IJ SOLN
INTRAMUSCULAR | Status: DC | PRN
  Administered 2022-03-25: 100 ug via INTRAVENOUS

## 2022-03-25 MED ORDER — DIPHENHYDRAMINE HCL 12.5 MG/5ML PO ELIX: 12.5000 mg | ORAL_SOLUTION | Freq: Four times a day (QID) | ORAL | Status: DC | PRN

## 2022-03-25 MED ORDER — PROPOFOL 10 MG/ML IV BOLUS
INTRAVENOUS | Status: DC | PRN
  Administered 2022-03-25: 170 mg via INTRAVENOUS
  Administered 2022-03-25: 30 mg via INTRAVENOUS

## 2022-03-25 MED ORDER — ORAL CARE MOUTH RINSE: 15.0000 mL | Freq: Once | OROMUCOSAL | Status: AC

## 2022-03-25 MED ORDER — FENTANYL CITRATE PF 50 MCG/ML IJ SOSY: 25.0000 ug | PREFILLED_SYRINGE | INTRAMUSCULAR | Status: DC | PRN

## 2022-03-25 MED ORDER — ZOLPIDEM TARTRATE 5 MG PO TABS: 5.0000 mg | ORAL_TABLET | Freq: Every evening | ORAL | Status: DC | PRN

## 2022-03-25 MED ORDER — SODIUM CHLORIDE 0.9 % IV SOLN: INTRAVENOUS | Status: DC

## 2022-03-25 MED ORDER — PHENYLEPHRINE 80 MCG/ML (10ML) SYRINGE FOR IV PUSH (FOR BLOOD PRESSURE SUPPORT)
PREFILLED_SYRINGE | INTRAVENOUS | Status: DC | PRN
  Administered 2022-03-25 (×6): 80 ug via INTRAVENOUS

## 2022-03-25 MED ORDER — AZELASTINE HCL 0.1 % NA SOLN
2.0000 | Freq: Two times a day (BID) | NASAL | Status: DC
Start: 2022-03-25 — End: 2022-03-27
  Filled 2022-03-25: qty 30

## 2022-03-25 MED ORDER — ONDANSETRON HCL 4 MG/2ML IJ SOLN
INTRAMUSCULAR | Status: AC
  Filled 2022-03-25: qty 2

## 2022-03-25 MED ORDER — ALBUTEROL SULFATE (2.5 MG/3ML) 0.083% IN NEBU: 2.5000 mg | INHALATION_SOLUTION | Freq: Four times a day (QID) | RESPIRATORY_TRACT | Status: DC | PRN

## 2022-03-25 MED ORDER — MELOXICAM 7.5 MG PO TABS
7.5000 mg | ORAL_TABLET | Freq: Every day | ORAL | Status: DC
  Administered 2022-03-25 – 2022-03-27 (×3): 7.5 mg via ORAL
  Filled 2022-03-25 (×3): qty 1

## 2022-03-25 MED ORDER — BACLOFEN 10 MG PO TABS
10.0000 mg | ORAL_TABLET | Freq: Every day | ORAL | Status: DC
  Administered 2022-03-25 – 2022-03-27 (×3): 10 mg via ORAL
  Filled 2022-03-25 (×3): qty 1

## 2022-03-25 MED ORDER — DIVALPROEX SODIUM 250 MG PO DR TAB
250.0000 mg | DELAYED_RELEASE_TABLET | Freq: Every day | ORAL | Status: DC
  Administered 2022-03-25 – 2022-03-26 (×2): 250 mg via ORAL
  Filled 2022-03-25 (×2): qty 1

## 2022-03-25 MED ORDER — HYDROXYCHLOROQUINE SULFATE 200 MG PO TABS
200.0000 mg | ORAL_TABLET | Freq: Two times a day (BID) | ORAL | Status: DC
  Administered 2022-03-25 – 2022-03-27 (×4): 200 mg via ORAL
  Filled 2022-03-25 (×4): qty 1

## 2022-03-25 MED ORDER — METOPROLOL SUCCINATE ER 25 MG PO TB24
25.0000 mg | ORAL_TABLET | Freq: Two times a day (BID) | ORAL | Status: DC
  Administered 2022-03-25 – 2022-03-27 (×4): 25 mg via ORAL
  Filled 2022-03-25 (×4): qty 1

## 2022-03-25 MED ORDER — HYDROMORPHONE HCL 1 MG/ML IJ SOLN: 0.5000 mg | INTRAMUSCULAR | Status: DC | PRN

## 2022-03-25 MED ORDER — LACTATED RINGERS IV SOLN
INTRAVENOUS | Status: DC
  Administered 2022-03-25: 1000 mL via INTRAVENOUS

## 2022-03-25 MED ORDER — SERTRALINE HCL 50 MG PO TABS
50.0000 mg | ORAL_TABLET | Freq: Every day | ORAL | Status: DC
  Administered 2022-03-25 – 2022-03-27 (×3): 50 mg via ORAL
  Filled 2022-03-25 (×3): qty 1

## 2022-03-25 MED ORDER — INSULIN ASPART 100 UNIT/ML IJ SOLN: 0.0000 [IU] | Freq: Every day | INTRAMUSCULAR | Status: DC

## 2022-03-25 MED ORDER — FUROSEMIDE 40 MG PO TABS
40.0000 mg | ORAL_TABLET | Freq: Every day | ORAL | Status: DC
  Administered 2022-03-25 – 2022-03-27 (×3): 40 mg via ORAL
  Filled 2022-03-25 (×3): qty 1

## 2022-03-25 MED ORDER — BUDESONIDE 0.25 MG/2ML IN SUSP
0.2500 mg | Freq: Two times a day (BID) | RESPIRATORY_TRACT | Status: DC
  Administered 2022-03-25 – 2022-03-27 (×4): 0.25 mg via RESPIRATORY_TRACT
  Filled 2022-03-25 (×4): qty 2

## 2022-03-25 MED ORDER — ALBUTEROL SULFATE (2.5 MG/3ML) 0.083% IN NEBU
3.0000 mL | INHALATION_SOLUTION | Freq: Four times a day (QID) | RESPIRATORY_TRACT | Status: DC | PRN
Start: 2022-03-25 — End: 2022-03-27

## 2022-03-25 MED ORDER — FENTANYL CITRATE (PF) 100 MCG/2ML IJ SOLN
INTRAMUSCULAR | Status: AC
  Filled 2022-03-25: qty 2

## 2022-03-25 MED ORDER — OXYCODONE HCL 5 MG PO TABS
5.0000 mg | ORAL_TABLET | ORAL | Status: DC | PRN
  Administered 2022-03-25 – 2022-03-26 (×2): 5 mg via ORAL
  Filled 2022-03-25 (×2): qty 1

## 2022-03-25 MED ORDER — DIPHENHYDRAMINE HCL 50 MG/ML IJ SOLN: 12.5000 mg | Freq: Four times a day (QID) | INTRAMUSCULAR | Status: DC | PRN

## 2022-03-25 MED ORDER — INSULIN ASPART 100 UNIT/ML IJ SOLN: 0.0000 [IU] | Freq: Three times a day (TID) | INTRAMUSCULAR | Status: DC

## 2022-03-25 MED ORDER — CHLORHEXIDINE GLUCONATE 0.12 % MT SOLN
15.0000 mL | Freq: Once | OROMUCOSAL | Status: AC
  Administered 2022-03-25: 15 mL via OROMUCOSAL

## 2022-03-25 MED ORDER — MOMETASONE FURO-FORMOTEROL FUM 200-5 MCG/ACT IN AERO
2.0000 | INHALATION_SPRAY | Freq: Two times a day (BID) | RESPIRATORY_TRACT | Status: DC
Start: 2022-03-25 — End: 2022-03-27
  Administered 2022-03-25 – 2022-03-27 (×4): 2 via RESPIRATORY_TRACT
  Filled 2022-03-25: qty 8.8

## 2022-03-25 MED ORDER — MEPERIDINE HCL 50 MG/ML IJ SOLN: 6.2500 mg | INTRAMUSCULAR | Status: DC | PRN

## 2022-03-25 MED ORDER — PREDNISONE 20 MG PO TABS
20.0000 mg | ORAL_TABLET | Freq: Every day | ORAL | Status: DC
  Administered 2022-03-26 – 2022-03-27 (×2): 20 mg via ORAL
  Filled 2022-03-25 (×2): qty 1

## 2022-03-25 MED ORDER — ONDANSETRON HCL 4 MG/2ML IJ SOLN
INTRAMUSCULAR | Status: DC | PRN
  Administered 2022-03-25: 4 mg via INTRAVENOUS

## 2022-03-25 MED ORDER — SUCCINYLCHOLINE CHLORIDE 200 MG/10ML IV SOSY
PREFILLED_SYRINGE | INTRAVENOUS | Status: AC
  Filled 2022-03-25: qty 10

## 2022-03-25 MED ORDER — LIDOCAINE HCL (CARDIAC) PF 100 MG/5ML IV SOSY
PREFILLED_SYRINGE | INTRAVENOUS | Status: DC | PRN
  Administered 2022-03-25: 40 mg via INTRATRACHEAL

## 2022-03-25 MED ORDER — GABAPENTIN 300 MG PO CAPS
900.0000 mg | ORAL_CAPSULE | Freq: Three times a day (TID) | ORAL | Status: DC
  Administered 2022-03-25 – 2022-03-26 (×3): 900 mg via ORAL
  Filled 2022-03-25 (×3): qty 3

## 2022-03-25 MED ORDER — LIDOCAINE HCL (PF) 2 % IJ SOLN
INTRAMUSCULAR | Status: AC
  Filled 2022-03-25: qty 5

## 2022-03-25 MED ORDER — ONDANSETRON HCL 4 MG/2ML IJ SOLN
4.0000 mg | INTRAMUSCULAR | Status: DC | PRN
Start: 2022-03-25 — End: 2022-03-27

## 2022-03-25 MED ORDER — PNEUMOCOCCAL 20-VAL CONJ VACC 0.5 ML IM SUSY: 0.5000 mL | PREFILLED_SYRINGE | INTRAMUSCULAR | Status: DC

## 2022-03-25 MED ORDER — MIDAZOLAM HCL 2 MG/2ML IJ SOLN
INTRAMUSCULAR | Status: AC
  Filled 2022-03-25: qty 2

## 2022-03-25 MED ORDER — ATORVASTATIN CALCIUM 40 MG PO TABS
80.0000 mg | ORAL_TABLET | Freq: Every day | ORAL | Status: DC
  Administered 2022-03-25 – 2022-03-27 (×3): 80 mg via ORAL
  Filled 2022-03-25 (×3): qty 2

## 2022-03-25 MED ORDER — SUCCINYLCHOLINE CHLORIDE 200 MG/10ML IV SOSY
PREFILLED_SYRINGE | INTRAVENOUS | Status: DC | PRN
  Administered 2022-03-25: 140 mg via INTRAVENOUS

## 2022-03-25 MED ORDER — TRAZODONE HCL 50 MG PO TABS
100.0000 mg | ORAL_TABLET | Freq: Every day | ORAL | Status: DC
Start: 2022-03-25 — End: 2022-03-27
  Administered 2022-03-25 – 2022-03-26 (×2): 100 mg via ORAL
  Filled 2022-03-25 (×2): qty 2

## 2022-03-25 MED ORDER — DIVALPROEX SODIUM 250 MG PO DR TAB
500.0000 mg | DELAYED_RELEASE_TABLET | Freq: Two times a day (BID) | ORAL | Status: DC
  Administered 2022-03-25 – 2022-03-27 (×4): 500 mg via ORAL
  Filled 2022-03-25 (×4): qty 2

## 2022-03-25 MED ORDER — ONDANSETRON HCL 4 MG/2ML IJ SOLN: 4.0000 mg | Freq: Once | INTRAMUSCULAR | Status: DC | PRN

## 2022-03-25 MED ORDER — STERILE WATER FOR IRRIGATION IR SOLN
Status: DC | PRN
  Administered 2022-03-25: 3000 mL

## 2022-03-25 MED ORDER — BENZTROPINE MESYLATE 1 MG PO TABS
0.5000 mg | ORAL_TABLET | Freq: Every day | ORAL | Status: DC
  Administered 2022-03-25 – 2022-03-27 (×3): 0.5 mg via ORAL
  Filled 2022-03-25 (×3): qty 1

## 2022-03-25 MED ORDER — UMECLIDINIUM BROMIDE 62.5 MCG/ACT IN AEPB
1.0000 | INHALATION_SPRAY | Freq: Every day | RESPIRATORY_TRACT | Status: DC
Start: 2022-03-25 — End: 2022-03-27
  Administered 2022-03-26 – 2022-03-27 (×2): 1 via RESPIRATORY_TRACT
  Filled 2022-03-25: qty 7

## 2022-03-25 SURGICAL SUPPLY — 21 items
BAG DRAIN URO TABLE W/ADPT NS (BAG) ×2 IMPLANT
BAG DRN 8 ADPR NS SKTRN CSTL (BAG) ×1
BAG HAMPER (MISCELLANEOUS) ×2 IMPLANT
BALLN NEPHROSTOMY (BALLOONS)
BALLOON NEPHROSTOMY (BALLOONS) IMPLANT
CATH FOLEY LATEX FREE 22FR (CATHETERS) ×2
CATH FOLEY LF 22FR (CATHETERS) IMPLANT
ELECT KNIFE MONO 22-24F 12/30D (ELECTROSURGICAL) ×2
ELECTRODE KNF MON 22-24F 12/30 (ELECTROSURGICAL) IMPLANT
GLOVE BIO SURGEON STRL SZ8 (GLOVE) ×2 IMPLANT
GLOVE BIOGEL PI IND STRL 7.0 (GLOVE) ×2 IMPLANT
GLOVE BIOGEL PI INDICATOR 7.0 (GLOVE) ×2
GLOVE SS BIOGEL STRL SZ 6.5 (GLOVE) IMPLANT
GLOVE SUPERSENSE BIOGEL SZ 6.5 (GLOVE) ×1
GOWN STRL REUS W/TWL LRG LVL3 (GOWN DISPOSABLE) ×2 IMPLANT
GOWN STRL REUS W/TWL XL LVL3 (GOWN DISPOSABLE) ×2 IMPLANT
KIT TURNOVER CYSTO (KITS) ×2 IMPLANT
PACK CYSTO (CUSTOM PROCEDURE TRAY) ×2 IMPLANT
PAD ARMBOARD 7.5X6 YLW CONV (MISCELLANEOUS) ×2 IMPLANT
SYR BULB IRRIG 60ML STRL (SYRINGE) ×2 IMPLANT
WATER STERILE IRR 3000ML UROMA (IV SOLUTION) ×2 IMPLANT

## 2022-03-25 NOTE — Anesthesia Procedure Notes (Signed)
Procedure Name: Intubation Date/Time: 03/25/2022 12:31 PM  Performed by: Karna Dupes, CRNAPre-anesthesia Checklist: Patient identified, Emergency Drugs available, Suction available and Patient being monitored Patient Re-evaluated:Patient Re-evaluated prior to induction Oxygen Delivery Method: Circle system utilized Preoxygenation: Pre-oxygenation with 100% oxygen Induction Type: IV induction Ventilation: Mask ventilation without difficulty Laryngoscope Size: Mac and 4 Grade View: Grade I Tube type: Oral Tube size: 7.5 mm Number of attempts: 1 Airway Equipment and Method: Stylet Placement Confirmation: ETT inserted through vocal cords under direct vision, positive ETCO2 and breath sounds checked- equal and bilateral Secured at: 22 cm Tube secured with: Tape Dental Injury: Teeth and Oropharynx as per pre-operative assessment

## 2022-03-25 NOTE — Plan of Care (Signed)
Problem: Education: Goal: Ability to describe self-care measures that may prevent or decrease complications (Diabetes Survival Skills Education) will improve Outcome: Progressing Goal: Individualized Educational Video(s) Outcome: Progressing   Problem: Coping: Goal: Ability to adjust to condition or change in health will improve Outcome: Progressing   Problem: Fluid Volume: Goal: Ability to maintain a balanced intake and output will improve Outcome: Progressing   Problem: Health Behavior/Discharge Planning: Goal: Ability to identify and utilize available resources and services will improve Outcome: Progressing Goal: Ability to manage health-related needs will improve Outcome: Progressing   Problem: Metabolic: Goal: Ability to maintain appropriate glucose levels will improve Outcome: Progressing   Problem: Nutritional: Goal: Maintenance of adequate nutrition will improve Outcome: Progressing Goal: Progress toward achieving an optimal weight will improve Outcome: Progressing   Problem: Skin Integrity: Goal: Risk for impaired skin integrity will decrease Outcome: Progressing   Problem: Tissue Perfusion: Goal: Adequacy of tissue perfusion will improve Outcome: Progressing   Problem: Education: Goal: Knowledge of General Education information will improve Description: Including pain rating scale, medication(s)/side effects and non-pharmacologic comfort measures Outcome: Progressing   Problem: Health Behavior/Discharge Planning: Goal: Ability to manage health-related needs will improve Outcome: Progressing   Problem: Clinical Measurements: Goal: Ability to maintain clinical measurements within normal limits will improve Outcome: Progressing Goal: Will remain free from infection Outcome: Progressing Goal: Diagnostic test results will improve Outcome: Progressing Goal: Respiratory complications will improve Outcome: Progressing Goal: Cardiovascular complication will  be avoided Outcome: Progressing   Problem: Activity: Goal: Risk for activity intolerance will decrease Outcome: Progressing   Problem: Nutrition: Goal: Adequate nutrition will be maintained Outcome: Progressing   Problem: Coping: Goal: Level of anxiety will decrease Outcome: Progressing   Problem: Elimination: Goal: Will not experience complications related to bowel motility Outcome: Progressing Goal: Will not experience complications related to urinary retention Outcome: Progressing   Problem: Pain Managment: Goal: General experience of comfort will improve Outcome: Progressing   Problem: Safety: Goal: Ability to remain free from injury will improve Outcome: Progressing   Problem: Skin Integrity: Goal: Risk for impaired skin integrity will decrease Outcome: Progressing   Problem: Education: Goal: Knowledge of General Education information will improve Description: Including pain rating scale, medication(s)/side effects and non-pharmacologic comfort measures Outcome: Progressing   Problem: Health Behavior/Discharge Planning: Goal: Ability to manage health-related needs will improve Outcome: Progressing   Problem: Clinical Measurements: Goal: Ability to maintain clinical measurements within normal limits will improve Outcome: Progressing Goal: Will remain free from infection Outcome: Progressing Goal: Diagnostic test results will improve Outcome: Progressing Goal: Respiratory complications will improve Outcome: Progressing Goal: Cardiovascular complication will be avoided Outcome: Progressing   Problem: Activity: Goal: Risk for activity intolerance will decrease Outcome: Progressing   Problem: Nutrition: Goal: Adequate nutrition will be maintained Outcome: Progressing   Problem: Coping: Goal: Level of anxiety will decrease Outcome: Progressing   Problem: Elimination: Goal: Will not experience complications related to bowel motility Outcome:  Progressing Goal: Will not experience complications related to urinary retention Outcome: Progressing   Problem: Pain Managment: Goal: General experience of comfort will improve Outcome: Progressing   Problem: Safety: Goal: Ability to remain free from injury will improve Outcome: Progressing   Problem: Skin Integrity: Goal: Risk for impaired skin integrity will decrease Outcome: Progressing   Problem: Education: Goal: Required Educational Video(s) Outcome: Progressing   Problem: Clinical Measurements: Goal: Postoperative complications will be avoided or minimized Outcome: Progressing   Problem: Skin Integrity: Goal: Demonstration of wound healing without infection will improve Outcome: Progressing

## 2022-03-25 NOTE — Interval H&P Note (Signed)
History and Physical Interval Note:  03/25/2022 12:11 PM  [name]  has presented today for surgery, with the diagnosis of uretheral stricture.  The various methods of treatment have been discussed with the patient and family. After consideration of risks, benefits and other options for treatment, the patient has consented to  Procedure(s): CYSTOSCOPY WITH DIRECT VISION INTERNAL URETHROTOMY (N/A) as a surgical intervention.  The patient's history has been reviewed, patient examined, no change in status, stable for surgery.  I have reviewed the patient's chart and labs.  Questions were answered to the patient's satisfaction.     [name]

## 2022-03-25 NOTE — Anesthesia Postprocedure Evaluation (Signed)
Anesthesia Post Note  Patient: [name]  Procedure(s) Performed: CYSTOSCOPY WITH DIRECT VISION INTERNAL URETHROTOMY ([facility]reter)  Patient location during evaluation: PACU Anesthesia Type: General Level of consciousness: awake and alert and oriented Pain management: pain level controlled Vital Signs Assessment: post-procedure vital signs reviewed and stable Respiratory status: spontaneous breathing, nonlabored ventilation and respiratory function stable Cardiovascular status: blood pressure returned to baseline and stable Postop Assessment: no apparent nausea or vomiting Anesthetic complications: no   No notable events documented.   Last Vitals:  Vitals:   03/25/22 1445 03/25/22 1500  BP: (!) 107/53   Pulse: 78 74  Resp: 15 (!) 9  Temp:    SpO2: 94% 95%    Last Pain:  Vitals:   03/25/22 1415  TempSrc:   PainSc: 0-No pain                 Jai Bear C Raylynne Cubbage

## 2022-03-25 NOTE — Transfer of Care (Signed)
Immediate Anesthesia Transfer of Care Note  Patient: [name]  Procedure(s) Performed: CYSTOSCOPY WITH DIRECT VISION INTERNAL URETHROTOMY (Ureter)  Patient Location: PACU  Anesthesia Type:General  Level of Consciousness: awake  Airway & Oxygen Therapy: Patient Spontanous Breathing and Patient connected to nasal cannula oxygen  Post-op Assessment: Report given to RN and Post -op Vital signs reviewed and stable  Post vital signs: Reviewed and stable  Last Vitals:  Vitals Value Taken Time  BP 116/55   Temp    Pulse 79 03/25/22 1323  Resp 14   SpO2 95 % 03/25/22 1323  Vitals shown include unvalidated device data.  Last Pain:  Vitals:   03/25/22 0915  TempSrc: Oral  PainSc: 0-No pain      Patients Stated Pain Goal: 9 (01/65/80 0634)  Complications: No notable events documented.

## 2022-03-26 DIAGNOSIS — N320 Bladder-neck obstruction: Secondary | ICD-10-CM | POA: Diagnosis not present

## 2022-03-26 LAB — GLUCOSE, CAPILLARY
Glucose-Capillary: 76 mg/dL (ref 70–99)
Glucose-Capillary: 111 mg/dL — ABNORMAL HIGH (ref 70–99)
Glucose-Capillary: 112 mg/dL — ABNORMAL HIGH (ref 70–99)
Glucose-Capillary: 221 mg/dL — ABNORMAL HIGH (ref 70–99)
Glucose-Capillary: 100 mg/dL — ABNORMAL HIGH (ref 70–99)

## 2022-03-26 MED ORDER — OXYCODONE-ACETAMINOPHEN 5-325 MG PO TABS: 1.0000 | ORAL_TABLET | ORAL | 0 refills | Status: DC | PRN

## 2022-03-26 MED ORDER — CHLORHEXIDINE GLUCONATE CLOTH 2 % EX PADS
6.0000 | MEDICATED_PAD | Freq: Every day | CUTANEOUS | Status: DC
  Administered 2022-03-26 – 2022-03-27 (×2): 6 via TOPICAL

## 2022-03-27 DIAGNOSIS — N320 Bladder-neck obstruction: Secondary | ICD-10-CM | POA: Diagnosis not present

## 2022-03-27 LAB — GLUCOSE, CAPILLARY: Glucose-Capillary: 73 mg/dL (ref 70–99)

## 2022-03-30 ENCOUNTER — Encounter (HOSPITAL_COMMUNITY): Payer: Self-pay | Admitting: Urology

## 2022-03-30 ENCOUNTER — Ambulatory Visit (INDEPENDENT_AMBULATORY_CARE_PROVIDER_SITE_OTHER): Payer: Medicaid Other | Admitting: Physician Assistant

## 2022-03-30 VITALS — BP 110/62 | HR 79 | Ht 66.0 in | Wt 232.8 lb

## 2022-03-30 DIAGNOSIS — N138 Other obstructive and reflux uropathy: Secondary | ICD-10-CM

## 2022-03-30 DIAGNOSIS — N320 Bladder-neck obstruction: Secondary | ICD-10-CM

## 2022-03-30 DIAGNOSIS — N401 Enlarged prostate with lower urinary tract symptoms: Secondary | ICD-10-CM

## 2022-04-07 ENCOUNTER — Telehealth (HOSPITAL_COMMUNITY): Payer: Self-pay | Admitting: *Deleted

## 2022-04-07 NOTE — Telephone Encounter (Signed)
Patient called to advise that he is having continued swelling in bilateral legs with redness and discomfort. Per Tarri Abernethy - PAC, since he is on Eliquis and was recently shown to be negative for DVT, I do not think any additional work-up to look for DVTs would be helpful at this time.  I recommend that he follow-up with his primary care doctor.  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  He should also see them to make sure he is not developing any cellulitis.  Detailed message left for patient.

## 2022-05-03 ENCOUNTER — Ambulatory Visit: Payer: Medicaid Other | Admitting: Physician Assistant

## 2022-05-10 ENCOUNTER — Ambulatory Visit: Payer: Medicaid Other | Admitting: Physician Assistant

## 2022-05-26 ENCOUNTER — Encounter: Payer: Self-pay | Admitting: Urology

## 2022-05-26 ENCOUNTER — Ambulatory Visit (INDEPENDENT_AMBULATORY_CARE_PROVIDER_SITE_OTHER): Payer: Medicaid Other | Admitting: Urology

## 2022-05-26 VITALS — BP 97/64 | HR 72

## 2022-05-26 DIAGNOSIS — N401 Enlarged prostate with lower urinary tract symptoms: Secondary | ICD-10-CM | POA: Diagnosis not present

## 2022-05-26 DIAGNOSIS — N320 Bladder-neck obstruction: Secondary | ICD-10-CM

## 2022-05-26 DIAGNOSIS — Z87440 Personal history of urinary (tract) infections: Secondary | ICD-10-CM | POA: Diagnosis not present

## 2022-05-26 DIAGNOSIS — N390 Urinary tract infection, site not specified: Secondary | ICD-10-CM

## 2022-05-26 DIAGNOSIS — N138 Other obstructive and reflux uropathy: Secondary | ICD-10-CM | POA: Diagnosis not present

## 2022-05-26 NOTE — Patient Instructions (Signed)
Urethral Stricture  Urethral stricture is narrowing of the tube (urethra) that carries urine from the bladder out of the body. The urethra can become narrow due to scar tissue from an injury or infection. This can make it difficult to pass urine. In women, the urethra opens above the vaginal opening. In men, the urethra opens at the tip of the penis, and the urethra is much longer than it is in women. Because of the length of the male urethra, urethral stricture is much more common in men. This condition is treated with surgery. What are the causes? In both men and women, common causes of urethral stricture include: Urinary tract infection (UTI). Sexually transmitted infection (STI). Use of a tube placed into the urethra to drain urine from the bladder (urinary catheter). Urinary tract surgery. In men, common causes of urethral stricture include: A severe injury to the pelvis. Prostate surgery. Injury to the penis. In many cases, the cause of urethral stricture is not known. What increases the risk? You are more likely to develop this condition if you: Are male. Men who have had prostate surgery are at risk of developing this condition. Use a urinary catheter. Have had urinary tract surgery. What are the signs or symptoms? The main symptom of this condition is difficulty passing urine. This may cause decreased urine flow, dribbling, or spraying of urine. Other symptom of this condition may include: Frequent UTIs. Blood in the urine. Pain when urinating. Swelling of the penis in men. Inability to pass urine (urinary obstruction). How is this diagnosed? This condition may be diagnosed based on: Your medical history and a physical exam. Urine tests to check for infection or bleeding. X-rays. Ultrasound. Retrograde urethrogram. This is a type of test in which dye is injected into the urethra and then an X-ray is taken. Urethroscopy. This is when a thin tube with a light and camera on  the end (urethroscope) is used to look at the urethra. How is this treated? This condition is treated with surgery. The type of surgery that you have depends on the severity of your condition. You may have: Urethral dilation. In this procedure, the narrow part of the urethra is stretched open (dilated) with dilating instruments or a small balloon. Urethrotomy. In this procedure, a urethroscope is placed into the urethra, and the narrow part of the urethra is cut open with a surgical blade inserted through the urethroscope. Open surgery. In this procedure, an incision is made in the urethra, the narrow part is removed, and the urethra is reconstructed. Follow these instructions at home:  Take over-the-counter and prescription medicines only as told by your health care provider. If you were prescribed an antibiotic medicine, take it as told by your health care provider. Do not stop taking the antibiotic even if you start to feel better. Drink enough fluid to keep your urine pale yellow. Keep all follow-up visits as told by your health care provider. This is important. Contact a health care provider if: You have signs of a urinary tract infection, such as: Frequent urination or passing small amounts of urine frequently. Needing to urinate urgently. Pain or burning with urination. Urine that smells bad or unusual. Cloudy urine. Pain in the lower abdomen or back. Trouble urinating. Blood in the urine. Vomiting or being less hungry than normal. Diarrhea or abdominal pain. Vaginal discharge, if you are male. Your symptoms are getting worse instead of better. Get help right away if: You cannot pass urine. You have a fever.  You have swelling, bruising, or discoloration of your genital area. This includes the penis, scrotum, and inner thighs for men, and the outer genital organs (vulva) and inner thighs for women. You develop swelling in your legs. You have difficulty  breathing. Summary Urethral stricture is narrowing of the tube (urethra) that carries urine from the bladder out of the body. The urethra can become narrow due to scar tissue from an injury or infection. This condition can make it difficult to pass urine. This condition is treated with surgery. The type of surgery that you have depends on the severity of your condition. Contact a health care provider if your symptoms get worse or you have signs of a urinary tract infection. This information is not intended to replace advice given to you by your health care provider. Make sure you discuss any questions you have with your health care provider. Document Revised: 07/28/2021 Document Reviewed: 07/28/2021 Elsevier Patient Education  [facility].

## 2022-05-26 NOTE — Progress Notes (Signed)
post void residual=195 

## 2022-05-26 NOTE — Progress Notes (Signed)
05/26/2022 2:19 PM   [name] July 11, 1966 [id number]  Referring provider: [name], MD [address] [place],  [place] [address]  Followup urethral stricture   HPI: [name] is a 56yo here for followup for BPH, urethral stricture and frequenct UTI. No UTIs since last visit His urine stream is strong. No straining to urinate. No urinary hesitancy. IPSS 4 QOL 1. 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[place])    right, s/p replacement, due to prednisone   Bipolar disorder ([place])    Colostomy in place [facility])    Crohn's colitis ([place])    s/p total colectomy with ileostomy in 2009   Crohn's disease of small intestine [facility]) Sept 2012   ileoscopy: multiple ulcers likely secondary to [place], HX OF 02/11/2009   Qualifier: Diagnosis of  By: Zeb Comfort     Depression    DVT (deep venous thrombosis) ([place]) 2009   right upper extremity due to PICC   Enterococcus UTI 2009   GERD (gastroesophageal reflux disease)    Hernia    Hyperlipidemia    Insomnia    Migraine headache    Nystagmus    PULMONARY EMBOLISM, HX OF 02/11/2009   Qualifier: Diagnosis of  By: Zeb Comfort     S/P endoscopy Sept 2012   mild gastritis, small hiatal hernia, no ulcers   Schizophrenia ([place])    Schizophrenia, acute ([place])     Surgical History: Past Surgical History:  Procedure Laterality Date   APPENDECTOMY     CYSTOSCOPY WITH DIRECT VISION INTERNAL URETHROTOMY N/A 03/25/2022   Procedure: CYSTOSCOPY WITH DIRECT VISION INTERNAL URETHROTOMY;  Surgeon: Cleon Gustin, MD;  Location: [facility];  Service: Urology;  Laterality: N/A;   ESOPHAGOGASTRODUODENOSCOPY  07/2010   gastritis,  bx neg for H.Pylori   ESOPHAGOGASTRODUODENOSCOPY N/A 01/08/2014   SLF:NO SOURCE FOR ODYNOPHAGIA IDENTIFIED/Multiple small ulcers in the gastric antrum   EYE SURGERY     ILEOSCOPY  06/29/2011   SLF:  ulcers,multiple/small HH/mild gastritis   KIDNEY SURGERY     LAPAROTOMY N/A 06/05/2013   Procedure: EXPLORATORY LAPAROTOMY;  Surgeon: Donato Heinz, MD;  Location: [facility];  Service: General;  Laterality: N/A;   LYSIS OF ADHESION N/A 06/05/2013   Procedure: LYSIS OF ADHESIONS;  Surgeon: Donato Heinz, MD;  Location: [facility];  Service: General;  Laterality: N/A;   small bowel capsule study  08/2010   few tiny nonbleeding erosions/ulcers ?secondary to relafen or Crohn's. Entire small bowel not seen.    TOTAL COLECTOMY  2009   with ileostomy at [facility] for refractory disease    TOTAL HIP ARTHROPLASTY     right, due to avascular necrosis from chronic prednisone   TOTAL SHOULDER REPLACEMENT     bilateral   TRANSURETHRAL RESECTION OF PROSTATE N/A 12/03/2019   Procedure: TRANSURETHRAL RESECTION OF THE PROSTATE (TURP);  Surgeon: Cleon Gustin, MD;  Location: [facility];  Service: Urology;  Laterality: N/A;    Home Medications:  Allergies as of 05/26/2022       Reactions   Penicillins Anaphylaxis   Bactrim [sulfamethoxazole-trimethoprim] Other (See Comments)   ABD CRAMPS AND DIARRHEA Welps/itch   Oxycodone-acetaminophen Other (See Comments)   Other reaction(s): Swelling   Remicade [infliximab] Other (See Comments)   COULDN'T BREATHE   Aspirin    REACTION: unknown reaction  Codeine    REACTION: unknown reaction   Ibuprofen    REACTION: unknown reaction   Macrobid [nitrofurantoin] Itching   Tramadol Nausea Only   Other reaction(s): Nausea   Tramadol Hcl    REACTION: unknown reaction   Vicodin [hydrocodone-acetaminophen] Nausea Only        Medication List        Accurate as of May 26, 2022  2:19 PM. If you have any questions, ask your nurse or doctor.          Accu-Chek Aviva Plus test strip Generic drug: glucose blood SMARTSIG:Via Meter   Active Life 1-Pc Drain 19-64mm Pouch Misc   albuterol (2.5 MG/3ML) 0.083% nebulizer solution Commonly known as: PROVENTIL Take  2.5 mg by nebulization every 6 (six) hours as needed for wheezing or shortness of breath.   albuterol 108 (90 Base) MCG/ACT inhaler Commonly known as: VENTOLIN HFA Inhale 2 puffs into the lungs every 6 (six) hours as needed for wheezing or shortness of breath.   atorvastatin 80 MG tablet Commonly known as: LIPITOR Take 80 mg by mouth daily.   azelastine 0.1 % nasal spray Commonly known as: ASTELIN Place 2 sprays into both nostrils 2 (two) times daily.   baclofen 10 MG tablet Commonly known as: LIORESAL Take 10 mg by mouth daily.   benztropine 0.5 MG tablet Commonly known as: COGENTIN Take 0.5 mg by mouth daily.   Calcium 600/Vitamin D3 600-20 MG-MCG Tabs Generic drug: Calcium Carb-Cholecalciferol TAKE (1) TABLET BY MOUTH THREE TIMES DAILY AFTER MEALS.   cyanocobalamin 1000 MCG tablet Take 1,000 mcg by mouth daily.   dicyclomine 10 MG capsule Commonly known as: BENTYL TAKE 1 CAPSULE BY MOUTH BEFORE MEALS AND AT BEDTIME AS NEEDED FOR ABDOMINAL CRAMPING. *HOLD FOR CONSTIPATION*   divalproex 250 MG DR tablet Commonly known as: DEPAKOTE Take by mouth at bedtime.   divalproex 500 MG DR tablet Commonly known as: DEPAKOTE Take 500 mg by mouth 2 (two) times daily.   docusate sodium 100 MG capsule Commonly known as: COLACE Take 1 capsule (100 mg total) by mouth 2 (two) times daily.   Dulera 200-5 MCG/ACT Aero Generic drug: mometasone-formoterol Inhale 2 puffs into the lungs in the morning and at bedtime.   Eliquis 5 MG Tabs tablet Generic drug: apixaban Take 5 mg by mouth 2 (two) times daily.   ferrous sulfate 325 (65 FE) MG EC tablet Take 1 tablet (325 mg total) by mouth daily with breakfast.   Flovent HFA 110 MCG/ACT inhaler Generic drug: fluticasone Inhale 2 puffs into the lungs 2 (two) times daily.   folic acid 1 MG tablet Commonly known as: FOLVITE Take 1 tablet by mouth daily.   furosemide 40 MG tablet Commonly known as: LASIX Take 40 mg by mouth daily.    gabapentin 300 MG capsule Commonly known as: NEURONTIN Take 900 mg by mouth 3 (three) times daily.   hydroxychloroquine 200 MG tablet Commonly known as: PLAQUENIL Take 1 tablet by mouth 2 (two) times daily.   meloxicam 7.5 MG tablet Commonly known as: MOBIC Take 7.5 mg by mouth daily.   metoprolol succinate 25 MG 24 hr tablet Commonly known as: TOPROL-XL Take 1 tablet (25 mg total) by mouth in the morning and at bedtime. Take with or immediately following a meal.   nitroGLYCERIN 0.4 MG SL tablet Commonly known as: NITROSTAT Place 1 tablet (0.4 mg total) under the tongue every 5 (five) minutes as needed for chest pain.   oxyCODONE-acetaminophen 5-325 MG tablet  Commonly known as: Percocet Take 1 tablet by mouth every 4 (four) hours as needed for severe pain.   pantoprazole 40 MG tablet Commonly known as: PROTONIX TAKE (1) TABLET BY MOUTH TWICE A DAY BEFORE MEALS. (BREAKFAST AND SUPPER)   predniSONE 20 MG tablet Commonly known as: DELTASONE Take by mouth.   risperiDONE 3 MG tablet Commonly known as: RISPERDAL Take 3 mg by mouth 2 (two) times daily.   sertraline 50 MG tablet Commonly known as: ZOLOFT Take 50 mg by mouth daily.   Spiriva Respimat 2.5 MCG/ACT Aers Generic drug: Tiotropium Bromide Monohydrate Inhale 2 puffs into the lungs daily.   tamsulosin 0.4 MG Caps capsule Commonly known as: FLOMAX Take 0.4 mg by mouth daily.   traZODone 100 MG tablet Commonly known as: DESYREL Take 100 mg by mouth at bedtime.   Walker Wheels Misc USE AS DIRECTED        Allergies:  Allergies  Allergen Reactions   Penicillins Anaphylaxis   Bactrim [Sulfamethoxazole-Trimethoprim] Other (See Comments)    ABD CRAMPS AND DIARRHEA Welps/itch   Oxycodone-Acetaminophen Other (See Comments)    Other reaction(s): Swelling   Remicade [Infliximab] Other (See Comments)    COULDN'T BREATHE   Aspirin     REACTION: unknown reaction   Codeine     REACTION: unknown reaction    Ibuprofen     REACTION: unknown reaction   Macrobid [Nitrofurantoin] Itching   Tramadol Nausea Only    Other reaction(s): Nausea   Tramadol Hcl     REACTION: unknown reaction   Vicodin [Hydrocodone-Acetaminophen] Nausea Only    Family History: Family History  Problem Relation Age of Onset   Diabetes Mother    Heart disease Mother    Diabetes Father    Heart disease Father    COPD Maternal Grandmother    Colon cancer Neg Hx     Social History:  reports that he has been smoking cigarettes. He has a 148.00 pack-year smoking history. He has never used smokeless tobacco. He reports that he does not drink alcohol and does not use drugs.  ROS: All other review of systems were reviewed and are negative except what is noted above in HPI  Physical Exam: BP 97/64   Pulse 72   Constitutional:  Alert and oriented, No acute distress. HEENT: [place] AT, moist mucus membranes.  Trachea midline, no masses. Cardiovascular: No clubbing, cyanosis, or edema. Respiratory: Normal respiratory effort, no increased work of breathing. GI: Abdomen is soft, nontender, nondistended, no abdominal masses GU: No CVA tenderness.  Lymph: No cervical or inguinal lymphadenopathy. Skin: No rashes, bruises or suspicious lesions. Neurologic: Grossly intact, no focal deficits, moving all 4 extremities. Psychiatric: Normal mood and affect.  Laboratory Data: Lab Results  Component Value Date   WBC 3.2 (L) 03/10/2022   HGB 14.6 03/10/2022   HCT 42.7 03/10/2022   MCV 98.8 03/10/2022   PLT 64 (L) 03/10/2022    Lab Results  Component Value Date   CREATININE 0.86 03/10/2022    No results found for: "PSA"  No results found for: "TESTOSTERONE"  Lab Results  Component Value Date   HGBA1C 5.2 02/23/2022    Urinalysis    Component Value Date/Time   COLORURINE YELLOW 05/16/2021 1411   APPEARANCEUR Cloudy (A) 03/03/2022 1103   LABSPEC 1.016 05/16/2021 1411   PHURINE 6.0 05/16/2021 1411   GLUCOSEU Negative  03/03/2022 1103   HGBUR SMALL (A) 05/16/2021 1411   BILIRUBINUR Negative 03/03/2022 1103   [place] 05/16/2021 1411  PROTEINUR 1+ (A) 03/03/2022 1103   PROTEINUR NEGATIVE 05/16/2021 1411   UROBILINOGEN 0.2 10/17/2014 1940   NITRITE Positive (A) 03/03/2022 1103   NITRITE POSITIVE (A) 05/16/2021 1411   LEUKOCYTESUR 2+ (A) 03/03/2022 1103   LEUKOCYTESUR TRACE (A) 05/16/2021 1411    Lab Results  Component Value Date   LABMICR See below: 03/03/2022   WBCUA >30 (A) 03/03/2022   LABEPIT 0-10 03/03/2022   MUCUS Present 02/03/2022   BACTERIA Many (A) 03/03/2022    Pertinent Imaging:  No results found for this or any previous visit.  Results for orders placed during the hospital encounter of 03/10/22  US Venous Img Lower Bilateral  Narrative CLINICAL DATA:  Bilateral leg swelling right-greater-than-left for 1 month, history of DVT. On Eliquis.  EXAM: BILATERAL LOWER EXTREMITY VENOUS DOPPLER ULTRASOUND  TECHNIQUE: Gray-scale sonography with compression, as well as color and duplex ultrasound, were performed to evaluate the deep venous system(s) from the level of the common femoral vein through the popliteal and proximal calf veins.  COMPARISON:  Ultrasound December 02, 2020.  FINDINGS: VENOUS  Normal compressibility of the common femoral, superficial femoral, and popliteal veins, as well as the visualized calf veins. Visualized portions of profunda femoral vein and great saphenous vein unremarkable. No filling defects to suggest DVT on grayscale or color Doppler imaging. Doppler waveforms show normal direction of venous flow, normal respiratory plasticity and response to augmentation.  Limited views of the contralateral common femoral vein are unremarkable.  OTHER  None.  Limitations: none  IMPRESSION: No evidence of deep venous thrombosis.   Electronically Signed By: Dahlia Bailiff M.D. On: 03/10/2022 09:15  No results found for this or any  previous visit.  No results found for this or any previous visit.  Results for orders placed during the hospital encounter of 04/27/16  US Renal  Narrative CLINICAL DATA:  Bladder neck alpha obstruction. Recurrent UTIs. History of Crohn's disease. Colostomy. EXAM: RENAL / URINARY TRACT ULTRASOUND COMPLETE COMPARISON:  CT 03/05/2014 . FINDINGS: Right Kidney: Length: 12.6 cm. Echogenicity within normal limits. No mass or hydronephrosis visualized. Left Kidney: Length: 11.9 cm. Echogenicity within normal limits. No mass or hydronephrosis visualized. Bladder: Appears normal for degree of bladder distention. Bilateral ureteral jets noted. IMPRESSION: No acute or focal abnormality. Electronically Signed By: Marcello Moores  Register On: 04/27/2016 13:37  No results found for this or any previous visit.  No results found for this or any previous visit.  No results found for this or any previous visit.   Assessment & Plan:    1. Frequent UTI -UA today shows no evidence of UTI - Urinalysis, Routine w reflex microscopic - BLADDER SCAN AMB NON-IMAGING  2. Benign prostatic hyperplasia with urinary obstruction -RTC 3 months with Flow PVR  3. Bladder neck contracture -RTC 3 months with flow PVR   No follow-ups on file.  [name], MD  [facility] [place]

## 2022-06-10 ENCOUNTER — Inpatient Hospital Stay: Payer: Medicaid Other | Attending: Hematology

## 2022-06-10 DIAGNOSIS — M069 Rheumatoid arthritis, unspecified: Secondary | ICD-10-CM | POA: Insufficient documentation

## 2022-06-10 DIAGNOSIS — K5090 Crohn's disease, unspecified, without complications: Secondary | ICD-10-CM | POA: Insufficient documentation

## 2022-06-10 DIAGNOSIS — Z87440 Personal history of urinary (tract) infections: Secondary | ICD-10-CM | POA: Insufficient documentation

## 2022-06-10 DIAGNOSIS — D709 Neutropenia, unspecified: Secondary | ICD-10-CM | POA: Diagnosis not present

## 2022-06-10 DIAGNOSIS — F17210 Nicotine dependence, cigarettes, uncomplicated: Secondary | ICD-10-CM | POA: Insufficient documentation

## 2022-06-10 DIAGNOSIS — Z86711 Personal history of pulmonary embolism: Secondary | ICD-10-CM | POA: Diagnosis not present

## 2022-06-10 DIAGNOSIS — D509 Iron deficiency anemia, unspecified: Secondary | ICD-10-CM | POA: Insufficient documentation

## 2022-06-10 DIAGNOSIS — E538 Deficiency of other specified B group vitamins: Secondary | ICD-10-CM

## 2022-06-10 DIAGNOSIS — D61818 Other pancytopenia: Secondary | ICD-10-CM | POA: Insufficient documentation

## 2022-06-10 DIAGNOSIS — D696 Thrombocytopenia, unspecified: Secondary | ICD-10-CM

## 2022-06-10 DIAGNOSIS — D72819 Decreased white blood cell count, unspecified: Secondary | ICD-10-CM

## 2022-06-10 DIAGNOSIS — Z7901 Long term (current) use of anticoagulants: Secondary | ICD-10-CM | POA: Insufficient documentation

## 2022-06-10 DIAGNOSIS — Z86718 Personal history of other venous thrombosis and embolism: Secondary | ICD-10-CM | POA: Diagnosis not present

## 2022-06-10 DIAGNOSIS — D539 Nutritional anemia, unspecified: Secondary | ICD-10-CM | POA: Insufficient documentation

## 2022-06-10 DIAGNOSIS — E611 Iron deficiency: Secondary | ICD-10-CM

## 2022-06-10 LAB — CBC WITH DIFFERENTIAL/PLATELET
Abs Immature Granulocytes: 0 10*3/uL (ref 0.00–0.07)
Basophils Absolute: 0 10*3/uL (ref 0.0–0.1)
Basophils Relative: 1 %
Eosinophils Absolute: 0.1 10*3/uL (ref 0.0–0.5)
Eosinophils Relative: 3 %
HCT: 35.9 % — ABNORMAL LOW (ref 39.0–52.0)
Hemoglobin: 11.6 g/dL — ABNORMAL LOW (ref 13.0–17.0)
Immature Granulocytes: 0 %
Lymphocytes Relative: 29 %
Lymphs Abs: 0.9 10*3/uL (ref 0.7–4.0)
MCH: 33.1 pg (ref 26.0–34.0)
MCHC: 32.3 g/dL (ref 30.0–36.0)
MCV: 102.6 fL — ABNORMAL HIGH (ref 80.0–100.0)
Monocytes Absolute: 0.6 10*3/uL (ref 0.1–1.0)
Monocytes Relative: 18 %
Neutro Abs: 1.5 10*3/uL — ABNORMAL LOW (ref 1.7–7.7)
Neutrophils Relative %: 49 %
Platelets: 57 10*3/uL — ABNORMAL LOW (ref 150–400)
RBC: 3.5 MIL/uL — ABNORMAL LOW (ref 4.22–5.81)
RDW: 15.9 % — ABNORMAL HIGH (ref 11.5–15.5)
WBC: 3.1 10*3/uL — ABNORMAL LOW (ref 4.0–10.5)
nRBC: 0 % (ref 0.0–0.2)

## 2022-06-10 LAB — FERRITIN: Ferritin: 51 ng/mL (ref 24–336)

## 2022-06-10 LAB — IRON AND TIBC
Iron: 48 ug/dL (ref 45–182)
Saturation Ratios: 17 % — ABNORMAL LOW (ref 17.9–39.5)
TIBC: 278 ug/dL (ref 250–450)
UIBC: 230 ug/dL

## 2022-06-10 LAB — VITAMIN B12: Vitamin B-12: 1361 pg/mL — ABNORMAL HIGH (ref 180–914)

## 2022-06-14 LAB — METHYLMALONIC ACID, SERUM: Methylmalonic Acid, Quantitative: 84 nmol/L (ref 0–378)

## 2022-06-17 ENCOUNTER — Encounter: Payer: Self-pay | Admitting: Physician Assistant

## 2022-06-17 ENCOUNTER — Inpatient Hospital Stay (HOSPITAL_BASED_OUTPATIENT_CLINIC_OR_DEPARTMENT_OTHER): Payer: Medicaid Other | Admitting: Physician Assistant

## 2022-06-17 VITALS — BP 113/67 | HR 64 | Temp 98.0°F | Resp 18 | Wt 216.7 lb

## 2022-06-17 DIAGNOSIS — D72819 Decreased white blood cell count, unspecified: Secondary | ICD-10-CM

## 2022-06-17 DIAGNOSIS — D696 Thrombocytopenia, unspecified: Secondary | ICD-10-CM

## 2022-06-17 DIAGNOSIS — D61818 Other pancytopenia: Secondary | ICD-10-CM | POA: Diagnosis not present

## 2022-06-17 DIAGNOSIS — D509 Iron deficiency anemia, unspecified: Secondary | ICD-10-CM

## 2022-06-17 DIAGNOSIS — E538 Deficiency of other specified B group vitamins: Secondary | ICD-10-CM

## 2022-06-17 DIAGNOSIS — I82409 Acute embolism and thrombosis of unspecified deep veins of unspecified lower extremity: Secondary | ICD-10-CM

## 2022-06-17 NOTE — Progress Notes (Signed)
 [facility] [address] [place], [place] [address]   CLINIC:  Medical Oncology/Hematology  PCP:  Kikel, Stephen, MD [address] [place] [address] [phone]   REASON FOR VISIT:  Follow-up for pancytopenia, iron deficiency, recurrent DVTs   CURRENT THERAPY: Eliquis  INTERVAL HISTORY:  [name] 56 y.o. male returns for routine follow-up of pancytopenia, iron deficiency, and recurrent DVTs.  He was last seen by   PA-C on 02/04/2022.  At today's visit, he reports feeling fair.  He continues to take Eliquis without any major bleeding issues. He has easy bruising.  No current symptoms concerning for DVT or PE.   He denies any B symptoms such as fever, chills, night sweats, unintentional weight loss.  He has some shortness of breath, but this only occurs when he is smoking cigarettes, so he is trying to quit.  He is currently down to 2 cigarettes daily.  He had surgical correction of bladder neck contracture on 03/25/2022.  He has not had any UTIs since surgery.  He had an episode of recent cellulitis, finished unspecified antibiotics 3 days ago as prescribed by his PCP (Dr. Kikel).  No prior blood per rectum or melena.  Increased fatigue for the past 6 weeks.  No pica.  Reports lightheadedness without syncope.  He has 10% energy and 50% appetite. He endorses that he is maintaining a stable weight.   REVIEW OF SYSTEMS:  Review of Systems  Constitutional:  Positive for fatigue. Negative for appetite change, chills, diaphoresis, fever and unexpected weight change.  HENT:   Negative for lump/mass and nosebleeds.   Eyes:  Negative for eye problems.  Respiratory:  Positive for cough (when smoking). Negative for hemoptysis and shortness of breath.   Cardiovascular:  Negative for chest pain, leg swelling and palpitations.  Gastrointestinal:  Positive for nausea and vomiting. Negative for abdominal pain, blood in stool, constipation and diarrhea.   Genitourinary:  Negative for dysuria, frequency and hematuria.   Skin: Negative.   Neurological:  Positive for headaches. Negative for dizziness and light-headedness.  Hematological:  Does not bruise/bleed easily.      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, s/p replacement, due to prednisone   Bipolar disorder (HCC)    Colostomy in place (HCC)    Crohn's colitis (HCC)    s/p total colectomy with ileostomy in 2009   Crohn's disease of small intestine (HCC) Sept 2012   ileoscopy: multiple ulcers likely secondary to Crohn's   DEEP VENOUS THROMBOPHLEBITIS, HX OF 02/11/2009   Qualifier: Diagnosis of  By: Sherman, Susan     Depression    DVT (deep venous thrombosis) (HCC) 2009   right upper extremity due to PICC   Enterococcus UTI 2009   GERD (gastroesophageal reflux disease)    Hernia    Hyperlipidemia    Insomnia    Migraine headache    Nystagmus    PULMONARY EMBOLISM, HX OF 02/11/2009   Qualifier: Diagnosis of  By: Sherman, Susan     S/P endoscopy Sept 2012   mild gastritis, small hiatal hernia, no ulcers   Schizophrenia (HCC)    Schizophrenia, acute (HCC)    Past Surgical History:  Procedure Laterality Date   APPENDECTOMY     CYSTOSCOPY WITH DIRECT VISION INTERNAL URETHROTOMY N/A 03/25/2022   Procedure: CYSTOSCOPY WITH DIRECT VISION INTERNAL   URETHROTOMY;  Surgeon: McKenzie, Patrick L, MD;  Location: [facility];  Service: Urology;  Laterality: N/A;   ESOPHAGOGASTRODUODENOSCOPY  07/2010   gastritis,  bx neg for H.Pylori   ESOPHAGOGASTRODUODENOSCOPY N/A 01/08/2014   SLF:NO SOURCE FOR ODYNOPHAGIA IDENTIFIED/Multiple small ulcers in the gastric antrum   EYE SURGERY     ILEOSCOPY  06/29/2011   SLF: ulcers,multiple/small HH/mild gastritis   KIDNEY SURGERY     LAPAROTOMY N/A 06/05/2013   Procedure: EXPLORATORY LAPAROTOMY;  Surgeon: Brent C Ziegler, MD;  Location: [facility];   Service: General;  Laterality: N/A;   LYSIS OF ADHESION N/A 06/05/2013   Procedure: LYSIS OF ADHESIONS;  Surgeon: Brent C Ziegler, MD;  Location: [facility];  Service: General;  Laterality: N/A;   small bowel capsule study  08/2010   few tiny nonbleeding erosions/ulcers ?secondary to relafen or Crohn's. Entire small bowel not seen.    TOTAL COLECTOMY  2009   with ileostomy at [facility] for refractory disease    TOTAL HIP ARTHROPLASTY     right, due to avascular necrosis from chronic prednisone   TOTAL SHOULDER REPLACEMENT     bilateral   TRANSURETHRAL RESECTION OF PROSTATE N/A 12/03/2019   Procedure: TRANSURETHRAL RESECTION OF THE PROSTATE (TURP);  Surgeon: McKenzie, Patrick L, MD;  Location: [facility];  Service: Urology;  Laterality: N/A;     SOCIAL HISTORY:  Social History   Socioeconomic History   Marital status: Single    Spouse name: Not on file   Number of children: Not on file   Years of education: Not on file   Highest education level: Not on file  Occupational History   Not on file  Tobacco Use   Smoking status: Every Day    Packs/day: 4.00    Years: 37.00    Total pack years: 148.00    Types: Cigarettes   Smokeless tobacco: Never   Tobacco comments:    down to 4ppd 11/14/20  Vaping Use   Vaping Use: Never used  Substance and Sexual Activity   Alcohol use: No   Drug use: No   Sexual activity: Never  Other Topics Concern   Not on file  Social History Narrative   Girlfriend of six years, Rectal Cancer on [facility] (05/2011).   Social Determinants of Health   Financial Resource Strain: Not on file  Food Insecurity: Not on file  Transportation Needs: No Transportation Needs (11/27/2020)   PRAPARE - Transportation    Lack of Transportation (Medical): No    Lack of Transportation (Non-Medical): No  Physical Activity: Not on file  Stress: Not on file  Social Connections: Not on file  Intimate Partner Violence: Not At Risk (11/27/2020)   Humiliation, Afraid, Rape, and Kick  questionnaire    Fear of Current or Ex-Partner: No    Emotionally Abused: No    Physically Abused: No    Sexually Abused: No    FAMILY HISTORY:  Family History  Problem Relation Age of Onset   Diabetes Mother    Heart disease Mother    Diabetes Father    Heart disease Father    COPD Maternal Grandmother    Colon cancer Neg Hx     CURRENT MEDICATIONS:  Outpatient Encounter Medications as of 06/17/2022  Medication Sig   ACCU-CHEK AVIVA PLUS test strip SMARTSIG:Via Meter   albuterol (PROVENTIL) (2.5 MG/3ML) 0.083% nebulizer solution Take 2.5 mg by nebulization every 6 (six) hours as needed for wheezing or shortness of breath.   albuterol (VENTOLIN HFA) 108 (  90 Base) MCG/ACT inhaler Inhale 2 puffs into the lungs every 6 (six) hours as needed for wheezing or shortness of breath.   atorvastatin (LIPITOR) 80 MG tablet Take 80 mg by mouth daily.   azelastine (ASTELIN) 0.1 % nasal spray Place 2 sprays into both nostrils 2 (two) times daily.   baclofen (LIORESAL) 10 MG tablet Take 10 mg by mouth daily.   benztropine (COGENTIN) 0.5 MG tablet Take 0.5 mg by mouth daily.    CALCIUM 600/VITAMIN D3 600-800 MG-UNIT TABS TAKE (1) TABLET BY MOUTH THREE TIMES DAILY AFTER MEALS.   cyanocobalamin 1000 MCG tablet Take 1,000 mcg by mouth daily.   dicyclomine (BENTYL) 10 MG capsule TAKE 1 CAPSULE BY MOUTH BEFORE MEALS AND AT BEDTIME AS NEEDED FOR ABDOMINAL CRAMPING. *HOLD FOR CONSTIPATION*   divalproex (DEPAKOTE) 250 MG DR tablet Take by mouth at bedtime.   divalproex (DEPAKOTE) 500 MG DR tablet Take 500 mg by mouth 2 (two) times daily.   docusate sodium (COLACE) 100 MG capsule Take 1 capsule (100 mg total) by mouth 2 (two) times daily.   ELIQUIS 5 MG TABS tablet Take 5 mg by mouth 2 (two) times daily.   ferrous sulfate 325 (65 FE) MG EC tablet Take 1 tablet (325 mg total) by mouth daily with breakfast.   FLOVENT HFA 110 MCG/ACT inhaler Inhale 2 puffs into the lungs 2 (two) times daily.   folic acid  (FOLVITE) 1 MG tablet Take 1 tablet by mouth daily.   furosemide (LASIX) 40 MG tablet Take 40 mg by mouth daily.   gabapentin (NEURONTIN) 300 MG capsule Take 900 mg by mouth 3 (three) times daily.   hydroxychloroquine (PLAQUENIL) 200 MG tablet Take 1 tablet by mouth 2 (two) times daily.   meloxicam (MOBIC) 7.5 MG tablet Take 7.5 mg by mouth daily.   metoprolol succinate (TOPROL-XL) 25 MG 24 hr tablet Take 1 tablet (25 mg total) by mouth in the morning and at bedtime. Take with or immediately following a meal.   Misc. Devices (WALKER WHEELS) MISC USE AS DIRECTED   mometasone-formoterol (DULERA) 200-5 MCG/ACT AERO Inhale 2 puffs into the lungs in the morning and at bedtime.   nitroGLYCERIN (NITROSTAT) 0.4 MG SL tablet Place 1 tablet (0.4 mg total) under the tongue every 5 (five) minutes as needed for chest pain.   Ostomy Supplies (ACTIVE LIFE 1-PC DRAIN 19-64MM) Pouch MISC    oxyCODONE-acetaminophen (PERCOCET) 5-325 MG tablet Take 1 tablet by mouth every 4 (four) hours as needed for severe pain.   pantoprazole (PROTONIX) 40 MG tablet TAKE (1) TABLET BY MOUTH TWICE A DAY BEFORE MEALS. (BREAKFAST AND SUPPER)   predniSONE (DELTASONE) 20 MG tablet Take by mouth.   risperiDONE (RISPERDAL) 3 MG tablet Take 3 mg by mouth 2 (two) times daily.   sertraline (ZOLOFT) 50 MG tablet Take 50 mg by mouth daily.   tamsulosin (FLOMAX) 0.4 MG CAPS capsule Take 0.4 mg by mouth daily.   Tiotropium Bromide Monohydrate (SPIRIVA RESPIMAT) 2.5 MCG/ACT AERS Inhale 2 puffs into the lungs daily.   traZODone (DESYREL) 100 MG tablet Take 100 mg by mouth at bedtime.   No facility-administered encounter medications on file as of 06/17/2022.    ALLERGIES:  Allergies  Allergen Reactions   Penicillins Anaphylaxis   Bactrim [Sulfamethoxazole-Trimethoprim] Other (See Comments)    ABD CRAMPS AND DIARRHEA Welps/itch   Oxycodone-Acetaminophen Other (See Comments)    Other reaction(s): Swelling   Remicade [Infliximab] Other (See  Comments)    COULDN'T BREATHE     Aspirin     REACTION: unknown reaction   Codeine     REACTION: unknown reaction   Ibuprofen     REACTION: unknown reaction   Macrobid [Nitrofurantoin] Itching   Tramadol Nausea Only    Other reaction(s): Nausea   Tramadol Hcl     REACTION: unknown reaction   Vicodin [Hydrocodone-Acetaminophen] Nausea Only     PHYSICAL EXAM:    ECOG PERFORMANCE STATUS: 1 - Symptomatic but completely ambulatory  There were no vitals filed for this visit. There were no vitals filed for this visit. Physical Exam Constitutional:      Appearance: Normal appearance. He is obese.  HENT:     Head: Normocephalic and atraumatic.     Mouth/Throat:     Mouth: Mucous membranes are moist.  Eyes:     Extraocular Movements: Extraocular movements intact.     Right eye: Nystagmus present.     Left eye: Nystagmus present.     Pupils: Pupils are equal, round, and reactive to light.  Cardiovascular:     Rate and Rhythm: Normal rate and regular rhythm.     Pulses: Normal pulses.     Heart sounds: Normal heart sounds.  Pulmonary:     Effort: Pulmonary effort is normal.     Breath sounds: Wheezing (COPD) present.  Abdominal:     General: Bowel sounds are normal.     Palpations: Abdomen is soft.     Tenderness: There is no abdominal tenderness.  Musculoskeletal:        General: No swelling.     Right lower leg: Edema (2+ ankle edema) present.     Left lower leg: Edema (2+ ankle edema) present.  Lymphadenopathy:     Cervical: No cervical adenopathy.  Skin:    General: Skin is warm and dry.     Findings: Erythema (Erythema of bilateral lower extremities) present.  Neurological:     General: No focal deficit present.     Mental Status: He is alert and oriented to person, place, and time.  Psychiatric:        Mood and Affect: Mood normal.        Behavior: Behavior normal.      LABORATORY DATA:  I have reviewed the labs as listed.  CBC    Component Value Date/Time    WBC 3.1 (L) 06/10/2022 1342   RBC 3.50 (L) 06/10/2022 1342   HGB 11.6 (L) 06/10/2022 1342   HCT 35.9 (L) 06/10/2022 1342   HCT 42 05/24/2011 1417   PLT 57 (L) 06/10/2022 1342   MCV 102.6 (H) 06/10/2022 1342   MCH 33.1 06/10/2022 1342   MCHC 32.3 06/10/2022 1342   RDW 15.9 (H) 06/10/2022 1342   LYMPHSABS 0.9 06/10/2022 1342   MONOABS 0.6 06/10/2022 1342   EOSABS 0.1 06/10/2022 1342   BASOSABS 0.0 06/10/2022 1342      Latest Ref Rng & Units 03/10/2022    7:45 AM 02/23/2022    2:27 PM 12/22/2021    9:40 AM  CMP  Glucose 70 - 99 mg/dL 83  71    BUN 6 - 20 mg/dL 12  7    Creatinine 0.61 - 1.24 mg/dL 0.86  0.84    Sodium 135 - 145 mmol/L 135  134    Potassium 3.5 - 5.1 mmol/L 4.6  5.1    Chloride 98 - 111 mmol/L 103  104    CO2 22 - 32 mmol/L 28  27    Calcium 8.9 - 10.3   mg/dL 8.6  8.8    Total Protein 6.5 - 8.1 g/dL 6.5   6.0   Total Bilirubin 0.3 - 1.2 mg/dL 1.1   0.8   Alkaline Phos 38 - 126 U/L 114   131   AST 15 - 41 U/L 68   48   ALT 0 - 44 U/L 35   31     DIAGNOSTIC IMAGING:  I have independently reviewed the relevant imaging and discussed with the patient.  ASSESSMENT & PLAN: 1.  Pancytopenia (resolved) with persistent thrombocytopenia - Labs obtained at external lab ([facility]) on 04/07/2021 showed new pancytopenia with WBC 2.3 (ANC 1.4, lymphocytes 0.6), platelets 113, Hgb 11.4 with MCV 111.0 - Nutritional panel (04/22/2021) was unremarkable: Normal methylmalonic acid/B12, copper, iron, folate - MGUS panel (04/22/2021): Immunofixation and SPEP were negative for monoclonal gammopathy, light chains unremarkable (mildly elevated kappa 30.3, but normal lambda and normal ratio); normal LDH - Pathology smear review (04/22/2021): Macrocytic anemia with polychromasia, occasional teardrop and RBC fragments, leukopenia with neutropenia - CT abdomen/pelvis (11/11/2021): No liver abnormality, spleen with 12.1 cm maximum diameter - Patient presented to ED on 05/06/2021 and was noted  to have worsening pancytopenia, therefore mercaptopurine and methotrexate were discontinued (ED provider discussed with on-call GI as well, who agreed) - Most recent labs (06/10/2022): Hgb 11.6/MCV 102.6, WBC 3.1/ANC 1.5, platelets 57.  No B12 deficiency. - He previously had frequent UTIs, but these resolved after surgical correction of bladder stricture.  No recent sinus infections or pneumonia.  Recently finished antibiotics for bilateral leg cellulitis. - No B symptoms     - Denies any major bleeding such as hematemesis, hematochezia, or melena    - Bone marrow biopsy (01/21/2022): Hypercellular marrow with trilineage hematopoiesis, no significant dysplasia, normal cytogenetics - Differential diagnosis:  Leukopenia: Likely reactive related to frequent UTIs and antibiotic use, versus prolonged medication effects from mercaptopurine and methotrexate, versus reactive in the setting of chronic inflammatory disease (Crohn's disease) Thrombocytopenia: Suspect chronic ITP versus prolonged medication effects from mercaptopurine/methotrexate (since these effects can sometimes last more than 1 year)  versus reactive thrombocytopenia from frequent UTIs and antibiotic use - If patient has episode of platelets less than 50 in the future, would consider pulsed dexamethasone with recheck platelets in 7 to 10 days to see if they have improved - PLAN: No indication for treatment of leukopenia or thrombocytopenia at this time - Repeat labs and RTC in 3 months, along with immature platelet fraction.   - Continue daily B12 (cyanocobalamin), but decreased to 500 mcg   2.  Iron deficiency anemia - Denies any major bleeding such as hematemesis, hematochezia, or melena   - Increased fatigue for the past 6 weeks.  No pica.  Reports lightheadedness without syncope. - He reports that he is taking iron tablet daily without any issues - He reports that he received IV iron (unknown type) several years ago in [place] and  required steroids due to swelling after the iron was given. - Most recent labs (06/10/2022): Hgb 11.6/MCV 102.6, ferritin 51, iron saturation 17% - PLAN: Continue oral ferrous sulfate once daily.   - Recommend IV iron with Venofer 300 mg x1 dose.  We discussed risk of reaction, and due to his previous history we will premedicate with steroids and Pepcid. - We will repeat CBC and iron panel in 3 months   3.  Recurrent DVTs with history of PE - Initial VTE events in 2009, around the same time the patient had colostomy placed -   History of pulmonary embolism with CT angio on 01/31/2008 which was positive for PE and bilateral lower lobe pulmonary infarcts, likely source suspected to be left subclavian Port-A-Cath which appeared to have contiguous embolus extending from the entry site into the left subclavian vein into the SVC - Venous ultrasound imaging (01/31/2008): Extensive deep vein thrombosis of left upper extremity extending into the jugular vein - Hypercoagulable panel was performed in 2009, which showed normal protein S, was negative for factor V Leiden gene mutation - Left lower extremity venous duplex (06/05/2020): Probable superficial thrombophlebitis in the left superficial greater saphenous vein - Patient reported that symptoms had worsened, and a repeat venous duplex performed in [place] on 06/19/2020 showed positive DVT involving common left femoral, profundofemoral, and superficial femoral veins - he was started on Eliquis at this time - Most recent venous duplex of left lower extremity (12/02/2020): Nonocclusive thrombus within the common femoral, profundofemoral, and superficial femoral veins on the left, felt to represent chronic recanalized thrombus - Patient has family history of unprovoked VTE in several family members on his mother side - Further hypercoagulable work-up has been deferred, as it would not change the patient's need for lifelong anticoagulation at this point - Patient  currently on Eliquis 5 mg twice daily (started after unprovoked DVT in September 2021), has previously been on warfarin (for treatment of upper extremity DVT/PE in 2009) - Most recent D-dimer (04/15/2021): 0.31 - PLAN: Continue Eliquis (indefinite anticoagulation as long as benefits outweigh risks)   4.  Crohn's disease & rheumatoid arthritis - Per external notes, disease was previously well controlled on methotrexate and mercaptopurine, but these unfortunately had to be discontinued due to worsening pancytopenia - Started on Humira instead.  Unfortunately, he developed severe UTI while on Humira, so it was discontinued. - He has been referred to an IBD clinic for specialized management of his Crohn's disease, but has not yet been started on any new medications     5.  Other history - Patient has extensive other conditions including Crohn's disease, bipolar affective disorder, asthma, and other conditions noted elsewhere in medical record   All questions were answered. The patient knows to call the clinic with any problems, questions or concerns.  Medical decision making: Moderate   Time spent on visit: I spent 25 minutes counseling the patient face to face. The total time spent in the appointment was 40 minutes and more than 50% was on counseling.   Harriett Rush, PA-C  06/17/2022 8:07 PM

## 2022-06-17 NOTE — Patient Instructions (Signed)
[place] [facility] [place] **   You were seen today by Tarri Abernethy PA-C for your low white blood cells, low platelets, and iron deficiency anemia.    LOW WHITE BLOOD CELLS & LOW PLATELETS Your low white blood cells and platelets are most likely related to your underlying autoimmune disease (Crohn's disease), inflammation, and infections. You do not need any treatment of these low blood cells at this time, but we will continue to monitor them closely with follow-up visit in 3 months.  IRON DEFICIENCY ANEMIA Your iron levels are slightly low to start taking iron pills. We will schedule you for IV iron x1 dose, along with premedications to decrease your risk of allergic reaction. Please see the attached handout for further information about this IV iron. You should still continue to take your iron tablet once daily  POSSIBLE LEG INFECTION (CELLULITIS) Since you were already prescribed antibiotics per your primary care doctor (Dr. Bartolo Darter), please follow-up with him if you notice any worsening redness, pain, or warmth in your legs.  Seek medical attention if you start having any fever.  MEDICATIONS: - DECREASE vitamin B12 to 500 mcg daily (instead of 1000 mcg) - Continue iron tablet once daily - Continue Eliquis  FOLLOW-UP APPOINTMENT: Office visit in 3 months, 1 week after labs  ** Thank you for trusting me with your healthcare!  I strive to provide all of my patients with quality care at each visit.  If you receive a survey for this visit, I would be so grateful to you for taking the time to provide feedback.  Thank you in advance!  ~ Samier Jaco                   Dr. Derek Jack   &   Tarri Abernethy, PA-C   - - - - - - - - - - - - - - - - - -    Thank you for choosing [place] [facility] to provide your oncology and hematology care.  To afford each patient quality time with our  provider, please arrive at least 15 minutes before your scheduled appointment time.   If you have a lab appointment with the [facility] please come in thru the Main Entrance and check in at the main information desk.  You need to re-schedule your appointment should you arrive 10 or more minutes late.  We strive to give you quality time with our providers, and arriving late affects you and other patients whose appointments are after yours.  Also, if you no show three or more times for appointments you may be dismissed from the clinic at the providers discretion.     Again, thank you for choosing [facility].  Our hope is that these requests will decrease the amount of time that you wait before being seen by our physicians.       _____________________________________________________________  Should you have questions after your visit to [facility], please contact our office at [phone] and follow the prompts.  Our office hours are 8:00 a.m. and 4:30 p.m. Monday - Friday.  Please note that voicemails left after 4:00 p.m. may not be returned until the following business day.  We are closed weekends and major holidays.  You do have access to a nurse 24-7, just call the main number to the clinic [phone] and do not press any options, hold on  the line and a nurse will answer the phone.    For prescription refill requests, have your pharmacy contact our office and allow 72 hours.

## 2022-06-21 ENCOUNTER — Inpatient Hospital Stay: Payer: Medicaid Other

## 2022-06-21 VITALS — BP 129/72 | HR 67 | Temp 97.4°F | Resp 18

## 2022-06-21 DIAGNOSIS — D61818 Other pancytopenia: Secondary | ICD-10-CM | POA: Diagnosis not present

## 2022-06-21 DIAGNOSIS — D509 Iron deficiency anemia, unspecified: Secondary | ICD-10-CM

## 2022-06-21 MED ORDER — SODIUM CHLORIDE 0.9 % IV SOLN: Freq: Once | INTRAVENOUS | Status: AC

## 2022-06-21 MED ORDER — LORATADINE 10 MG PO TABS
10.0000 mg | ORAL_TABLET | Freq: Once | ORAL | Status: AC
  Administered 2022-06-21: 10 mg via ORAL
  Filled 2022-06-21: qty 1

## 2022-06-21 MED ORDER — SODIUM CHLORIDE 0.9 % IV SOLN
300.0000 mg | Freq: Once | INTRAVENOUS | Status: AC
  Administered 2022-06-21: 300 mg via INTRAVENOUS
  Filled 2022-06-21: qty 300

## 2022-06-21 MED ORDER — FAMOTIDINE IN NACL 20-0.9 MG/50ML-% IV SOLN
20.0000 mg | Freq: Once | INTRAVENOUS | Status: AC
  Administered 2022-06-21: 20 mg via INTRAVENOUS
  Filled 2022-06-21: qty 50

## 2022-06-21 MED ORDER — METHYLPREDNISOLONE SODIUM SUCC 125 MG IJ SOLR
125.0000 mg | Freq: Once | INTRAMUSCULAR | Status: AC
  Administered 2022-06-21: 125 mg via INTRAVENOUS
  Filled 2022-06-21: qty 2

## 2022-06-21 MED ORDER — ACETAMINOPHEN 325 MG PO TABS
650.0000 mg | ORAL_TABLET | Freq: Once | ORAL | Status: AC
  Administered 2022-06-21: 650 mg via ORAL
  Filled 2022-06-21: qty 2

## 2022-06-21 NOTE — Patient Instructions (Signed)
[facility]  Discharge Instructions: Thank you for choosing [facility] to provide your oncology and hematology care.  If you have a lab appointment with the Cancer Center, please come in thru the Main Entrance and check in at the main information desk.  Wear comfortable clothing and clothing appropriate for easy access to any Portacath or PICC line.   We strive to give you quality time with your provider. You may need to reschedule your appointment if you arrive late (15 or more minutes).  Arriving late affects you and other patients whose appointments are after yours.  Also, if you miss three or more appointments without notifying the office, you may be dismissed from the clinic at the provider's discretion.      For prescription refill requests, have your pharmacy contact our office and allow 72 hours for refills to be completed.    Today you received the following chemotherapy and/or immunotherapy agents Venofer      To help prevent nausea and vomiting after your treatment, we encourage you to take your nausea medication as directed.  BELOW ARE SYMPTOMS THAT SHOULD BE REPORTED IMMEDIATELY: *FEVER GREATER THAN 100.4 F (38 C) OR HIGHER *CHILLS OR SWEATING *NAUSEA AND VOMITING THAT IS NOT CONTROLLED WITH YOUR NAUSEA MEDICATION *UNUSUAL SHORTNESS OF BREATH *UNUSUAL BRUISING OR BLEEDING *URINARY PROBLEMS (pain or burning when urinating, or frequent urination) *BOWEL PROBLEMS (unusual diarrhea, constipation, pain near the anus) TENDERNESS IN MOUTH AND THROAT WITH OR WITHOUT PRESENCE OF ULCERS (sore throat, sores in mouth, or a toothache) UNUSUAL RASH, SWELLING OR PAIN  UNUSUAL VAGINAL DISCHARGE OR ITCHING   Items with * indicate a potential emergency and should be followed up as soon as possible or go to the Emergency Department if any problems should occur.  Please show the CHEMOTHERAPY ALERT CARD or IMMUNOTHERAPY ALERT CARD at check-in to the Emergency  Department and triage nurse.  Should you have questions after your visit or need to cancel or reschedule your appointment, please contact [facility] [phone]  and follow the prompts.  Office hours are 8:00 a.m. to 4:30 p.m. Monday - Friday. Please note that voicemails left after 4:00 p.m. may not be returned until the following business day.  We are closed weekends and major holidays. You have access to a nurse at all times for urgent questions. Please call the main number to the clinic [phone] and follow the prompts.  For any non-urgent questions, you may also contact your provider using MyChart. We now offer e-Visits for anyone 18 and older to request care online for non-urgent symptoms. For details visit [email or web address].   Also download the MyChart app! Go to the app store, search "MyChart", open the app, select [facility], and log in with your MyChart username and password.  Masks are optional in the cancer centers. If you would like for your care team to wear a mask while they are taking care of you, please let them know. You may have one support person who is at least 57 years old accompany you for your appointments.  

## 2022-06-21 NOTE — Progress Notes (Signed)
Venofer given today per MD orders.  Tolerated infusion without adverse affects.  Vital signs stable.  No complaints at this time.  Discharge from clinic ambulatory in stable condition.  Alert and oriented X 3.  Follow up with [facility] as scheduled.

## 2022-07-26 ENCOUNTER — Ambulatory Visit: Payer: Medicaid Other | Admitting: Physician Assistant

## 2022-07-27 ENCOUNTER — Ambulatory Visit: Payer: Medicaid Other | Admitting: Physician Assistant

## 2022-07-29 ENCOUNTER — Encounter: Payer: Self-pay | Admitting: Nurse Practitioner

## 2022-07-29 ENCOUNTER — Ambulatory Visit: Payer: Medicaid Other | Attending: Nurse Practitioner | Admitting: Nurse Practitioner

## 2022-07-29 VITALS — BP 110/58 | HR 71 | Wt 223.0 lb

## 2022-07-29 DIAGNOSIS — R600 Localized edema: Secondary | ICD-10-CM | POA: Diagnosis not present

## 2022-07-29 DIAGNOSIS — I2510 Atherosclerotic heart disease of native coronary artery without angina pectoris: Secondary | ICD-10-CM | POA: Insufficient documentation

## 2022-07-29 DIAGNOSIS — R0602 Shortness of breath: Secondary | ICD-10-CM | POA: Diagnosis not present

## 2022-07-29 DIAGNOSIS — I471 Supraventricular tachycardia, unspecified: Secondary | ICD-10-CM | POA: Diagnosis not present

## 2022-07-29 DIAGNOSIS — Z86711 Personal history of pulmonary embolism: Secondary | ICD-10-CM

## 2022-07-29 DIAGNOSIS — Z86718 Personal history of other venous thrombosis and embolism: Secondary | ICD-10-CM | POA: Diagnosis present

## 2022-07-29 DIAGNOSIS — E785 Hyperlipidemia, unspecified: Secondary | ICD-10-CM | POA: Insufficient documentation

## 2022-07-29 DIAGNOSIS — R002 Palpitations: Secondary | ICD-10-CM | POA: Insufficient documentation

## 2022-07-29 MED ORDER — TORSEMIDE 20 MG PO TABS: 20.0000 mg | ORAL_TABLET | Freq: Two times a day (BID) | ORAL | 3 refills | Status: DC

## 2022-07-29 NOTE — Patient Instructions (Signed)
Medication Instructions:  Stop Lasix (Furosemide) as directed. Start Torsemide 20 mg one tablet in the morning and one tablet at noon.   *If you need a refill on your cardiac medications before your next appointment, please call your pharmacy*   Lab Work: Your physician recommends that you return for lab work in 2 weeks BMET  If you have labs (blood work) drawn today and your tests are completely normal, you will receive your results only by: [place] (if you have MyChart) OR A paper copy in the mail If you have any lab test that is abnormal or we need to change your treatment, we will call you to review the results.   Testing/Procedures: NONE ordered at this time of appointment     Follow-Up: At [facility], you and your health needs are our priority.  As part of our continuing mission to provide you with exceptional heart care, we have created designated Provider Care Teams.  These Care Teams include your primary Cardiologist (physician) and Advanced Practice Providers (APPs -  Physician Assistants and Nurse Practitioners) who all work together to provide you with the care you need, when you need it.  We recommend signing up for the patient portal called "MyChart".  Sign up information is provided on this After Visit Summary.  MyChart is used to connect with patients for Virtual Visits (Telemedicine).  Patients are able to view lab/test results, encounter notes, upcoming appointments, etc.  Non-urgent messages can be sent to your provider as well.   To learn more about what you can do with MyChart, go to [email or web address].    Your next appointment:   1 month(s)  The format for your next appointment:   In Person  Provider:   Diona Browner, NP        Other Instructions   Important Information About [facility]

## 2022-07-29 NOTE — Progress Notes (Signed)
Office Visit    Patient Name: [name] Date of Encounter: 07/29/2022  Primary Care Provider:  [name], MD Primary Cardiologist:  Elouise Munroe, MD  Chief Complaint    56 year old male with a history of SVT, mild nonobstructive CAD, palpitations, DVT, PE, hyperlipidemia, legal blindness, Crohn's disease s/p total colectomy and ileostomy in 2009, COPD, tobacco use, and schizophrenia who presents for follow-up related to palpitations.  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[place])    right, s/p replacement, due to prednisone   Bipolar disorder ([place])    Colostomy in place [facility])    Crohn's colitis ([place])    s/p total colectomy with ileostomy in 2009   Crohn's disease of small intestine [facility]) Sept 2012   ileoscopy: multiple ulcers likely secondary to [place], HX OF 02/11/2009   Qualifier: Diagnosis of  By: Zeb Comfort     Depression    DVT (deep venous thrombosis) ([place]) 2009   right upper extremity due to PICC   Enterococcus UTI 2009   GERD (gastroesophageal reflux disease)    Hernia    Hyperlipidemia    IDA (iron deficiency anemia) 06/17/2022   Insomnia    Migraine headache    Nystagmus    PULMONARY EMBOLISM, HX OF 02/11/2009   Qualifier: Diagnosis of  By: Zeb Comfort     S/P endoscopy Sept 2012   mild gastritis, small hiatal hernia, no ulcers   Schizophrenia ([place])    Schizophrenia, acute ([place])    Past Surgical History:  Procedure Laterality Date   APPENDECTOMY     CYSTOSCOPY WITH DIRECT VISION INTERNAL URETHROTOMY N/A 03/25/2022   Procedure: CYSTOSCOPY WITH DIRECT VISION INTERNAL URETHROTOMY;  Surgeon: Cleon Gustin, MD;  Location: [facility];  Service: Urology;  Laterality: N/A;   ESOPHAGOGASTRODUODENOSCOPY  07/2010   gastritis,  bx neg for H.Pylori   ESOPHAGOGASTRODUODENOSCOPY N/A 01/08/2014   SLF:NO SOURCE FOR ODYNOPHAGIA  IDENTIFIED/Multiple small ulcers in the gastric antrum   EYE SURGERY     ILEOSCOPY  06/29/2011   SLF: ulcers,multiple/small HH/mild gastritis   KIDNEY SURGERY     LAPAROTOMY N/A 06/05/2013   Procedure: EXPLORATORY LAPAROTOMY;  Surgeon: Donato Heinz, MD;  Location: [facility];  Service: General;  Laterality: N/A;   LYSIS OF ADHESION N/A 06/05/2013   Procedure: LYSIS OF ADHESIONS;  Surgeon: Donato Heinz, MD;  Location: [facility];  Service: General;  Laterality: N/A;   small bowel capsule study  08/2010   few tiny nonbleeding erosions/ulcers ?secondary to relafen or Crohn's. Entire small bowel not seen.    TOTAL COLECTOMY  2009   with ileostomy at [facility] for refractory disease    TOTAL HIP ARTHROPLASTY     right, due to avascular necrosis from chronic prednisone   TOTAL SHOULDER REPLACEMENT     bilateral   TRANSURETHRAL RESECTION OF PROSTATE N/A 12/03/2019   Procedure: TRANSURETHRAL RESECTION OF THE PROSTATE (TURP);  Surgeon: Cleon Gustin, MD;  Location: [facility];  Service: Urology;  Laterality: N/A;    Allergies  Allergies  Allergen Reactions   Penicillins Anaphylaxis   Bactrim [Sulfamethoxazole-Trimethoprim] Other (See Comments)    ABD CRAMPS AND DIARRHEA Welps/itch   Oxycodone-Acetaminophen Other (See Comments)    Other reaction(s): Swelling   Remicade [Infliximab] Other (See Comments)    COULDN'T BREATHE   Aspirin  REACTION: unknown reaction   Codeine     REACTION: unknown reaction   Ibuprofen     REACTION: unknown reaction   Macrobid [Nitrofurantoin] Itching   Tramadol Nausea Only    Other reaction(s): Nausea   Tramadol Hcl     REACTION: unknown reaction   Vicodin [Hydrocodone-Acetaminophen] Nausea Only    History of Present Illness    56 year old male with the above past medical history including SVT, palpitations, mild nonobstructive CAD, DVT, PE, hyperlipidemia, legal blindness, Crohn's disease s/p total colectomy and ileostomy in 2009, COPD, tobacco use, and  schizophrenia.  He has a history of palpitations and precordial chest pain.  Coronary CT angiogram in 08/2020 coronary calcium score of 70, mild CAD.  Echocardiogram in 08/2020 showed EF 45 to 50%, increased LV function, LV global hypokinesis, normal RV systolic function, no significant valvular abnormalities.  Repeat echocardiogram in 11/2020 showed EF 55 to 60%, normal LV function, mildly dilated left atrium, mild aortic valve sclerosis with no evidence of aortic valve stenosis.  Cardiac monitor in 11/2020 in setting of palpitations showed infrequent SVT, one 3.7-second pause that likely occurred while patient was sleeping.  He was last seen in the office on 12/17/2021 and was stable from a cardiac standpoint.  He was referred to EP.  He reported ongoing activity tolerance though he did note improvement in his heart rate and palpitations.  He denied any symptoms concerning for angina.  He was hospitalized in June 2023 in the setting of bladder neck contracture s/p cystoscopy with direct vision internal urethrotomy.  He presents today for follow-up.  Since his last visit his been stable overall from a cardiac standpoint though he does note an increase in bilateral lower extremity edema, dyspnea on exertion.  His weight is up almost 10 pounds in the past month.  Other than his bilateral lower extremity edema, increased shortness of breath, he denies any additional concerns today.  Home Medications    Current Outpatient Medications  Medication Sig Dispense Refill   ACCU-CHEK AVIVA PLUS test strip SMARTSIG:Via Meter     albuterol (PROVENTIL) (2.5 MG/3ML) 0.083% nebulizer solution Take 2.5 mg by nebulization every 6 (six) hours as needed for wheezing or shortness of breath.     albuterol (VENTOLIN HFA) 108 (90 Base) MCG/ACT inhaler Inhale 2 puffs into the lungs every 6 (six) hours as needed for wheezing or shortness of breath.     atorvastatin (LIPITOR) 80 MG tablet Take 80 mg by mouth daily.     azelastine  (ASTELIN) 0.1 % nasal spray Place 2 sprays into both nostrils 2 (two) times daily.     baclofen (LIORESAL) 10 MG tablet Take 10 mg by mouth daily.     benztropine (COGENTIN) 0.5 MG tablet Take 0.5 mg by mouth daily.      CALCIUM 600/VITAMIN D3 600-800 MG-UNIT TABS TAKE (1) TABLET BY MOUTH THREE TIMES DAILY AFTER MEALS. 84 tablet 11   cyanocobalamin 1000 MCG tablet Take 1,000 mcg by mouth daily.     diclofenac Sodium (VOLTAREN) 1 % GEL as directed Externally twice a day for 5 days     dicyclomine (BENTYL) 10 MG capsule TAKE 1 CAPSULE BY MOUTH BEFORE MEALS AND AT BEDTIME AS NEEDED FOR ABDOMINAL CRAMPING. *HOLD FOR CONSTIPATION* 90 capsule 11   divalproex (DEPAKOTE) 250 MG DR tablet Take by mouth at bedtime.     divalproex (DEPAKOTE) 500 MG DR tablet Take 500 mg by mouth 2 (two) times daily.     docusate sodium (COLACE)  100 MG capsule Take 1 capsule (100 mg total) by mouth 2 (two) times daily. 10 capsule 0   ELIQUIS 5 MG TABS tablet Take 5 mg by mouth 2 (two) times daily.     ferrous sulfate 325 (65 FE) MG EC tablet Take 1 tablet (325 mg total) by mouth daily with breakfast. 30 tablet 3   FLOVENT HFA 110 MCG/ACT inhaler Inhale 2 puffs into the lungs 2 (two) times daily.     folic acid (FOLVITE) 1 MG tablet Take 1 tablet by mouth daily.     gabapentin (NEURONTIN) 300 MG capsule Take 900 mg by mouth 3 (three) times daily.     hydroxychloroquine (PLAQUENIL) 200 MG tablet Take 1 tablet by mouth 2 (two) times daily.     meloxicam (MOBIC) 7.5 MG tablet Take 7.5 mg by mouth daily.     methocarbamol (ROBAXIN) 500 MG tablet Take 500 mg by mouth 3 (three) times daily.     metoprolol succinate (TOPROL-XL) 25 MG 24 hr tablet Take 1 tablet (25 mg total) by mouth in the morning and at bedtime. Take with or immediately following a meal. 180 tablet 2   mirtazapine (REMERON) 15 MG tablet Take 15 mg by mouth at bedtime.     Misc. Devices (WALKER WHEELS) MISC USE AS DIRECTED 1 each 0   mometasone-formoterol (DULERA)  200-5 MCG/ACT AERO Inhale 2 puffs into the lungs in the morning and at bedtime. 13 g 5   Ostomy Supplies (ACTIVE LIFE 1-PC DRAIN [phone]) Pouch MISC      oxyCODONE-acetaminophen (PERCOCET) 5-325 MG tablet Take 1 tablet by mouth every 4 (four) hours as needed for severe pain. 15 tablet 0   pantoprazole (PROTONIX) 40 MG tablet TAKE (1) TABLET BY MOUTH TWICE A DAY BEFORE MEALS. (BREAKFAST AND SUPPER) 56 tablet 11   Potassium Chloride ER 20 MEQ TBCR Take 1 tablet by mouth daily. Take for 28 days     predniSONE (DELTASONE) 20 MG tablet Take by mouth.     risperiDONE (RISPERDAL) 3 MG tablet Take 3 mg by mouth 2 (two) times daily.     sertraline (ZOLOFT) 50 MG tablet Take 50 mg by mouth daily.     tamsulosin (FLOMAX) 0.4 MG CAPS capsule Take 0.4 mg by mouth daily.     Tiotropium Bromide Monohydrate (SPIRIVA RESPIMAT) 2.5 MCG/ACT AERS Inhale 2 puffs into the lungs daily. 4 g 5   torsemide (DEMADEX) 20 MG tablet Take 1 tablet (20 mg total) by mouth 2 (two) times daily. 180 tablet 3   traZODone (DESYREL) 100 MG tablet Take 100 mg by mouth at bedtime.     nitroGLYCERIN (NITROSTAT) 0.4 MG SL tablet Place 1 tablet (0.4 mg total) under the tongue every 5 (five) minutes as needed for chest pain. 25 tablet 3   No current facility-administered medications for this visit.     Review of Systems    He denies chest pain, palpitations, pnd, orthopnea, n, v, dizziness, syncope, or early satiety. All other systems reviewed and are otherwise negative except as noted above.   Physical Exam    VS:  BP (!) 110/58 (BP Location: Left Arm, Patient Position: Sitting)   Pulse 71   Wt 223 lb (101.2 kg)   SpO2 94%   BMI 35.99 kg/m   GEN: Well nourished, well developed, in no acute distress. HEENT: normal. Neck: Supple, no JVD, carotid bruits, or masses. Cardiac: RRR, no murmurs, rubs, or gallops. No clubbing, cyanosis, nonpitting bilateral lower extremity edema.  Radials/DP/PT 2+ and equal bilaterally.  Respiratory:   Respirations regular and unlabored, clear to auscultation bilaterally. GI: Soft, nontender, nondistended, BS + x 4. MS: no deformity or atrophy. Skin: warm and dry, no rash. Neuro:  Strength and sensation are intact. Psych: Normal affect.  Accessory Clinical Findings    ECG personally reviewed by me today - No EKG in office today.  Lab Results  Component Value Date   WBC 3.1 (L) 06/10/2022   HGB 11.6 (L) 06/10/2022   HCT 35.9 (L) 06/10/2022   MCV 102.6 (H) 06/10/2022   PLT 57 (L) 06/10/2022   Lab Results  Component Value Date   CREATININE 0.86 03/10/2022   BUN 12 03/10/2022   NA 135 03/10/2022   K 4.6 03/10/2022   CL 103 03/10/2022   CO2 28 03/10/2022   Lab Results  Component Value Date   ALT 35 03/10/2022   AST 68 (H) 03/10/2022   ALKPHOS 114 03/10/2022   BILITOT 1.1 03/10/2022   Lab Results  Component Value Date   CHOL 90 (L) 12/22/2021   HDL 43 12/22/2021   LDLCALC 33 12/22/2021   TRIG 59 12/22/2021   CHOLHDL 2.1 12/22/2021    Lab Results  Component Value Date   HGBA1C 5.2 02/23/2022    Assessment & Plan    1. Bilateral lower extremity edema/shortness of breath: Most recent echo in 11/2020 showed EF 55 to 60%, normal LV function, mildly dilated left atrium, mild aortic valve sclerosis with no evidence of aortic valve stenosis. He notes a recent increase in bilateral lower extremity edema and shortness of breath.  He has been taking Lasix 40 mg daily with no improvement.  Weight is up.  Will transition from furosemide to torsemide 40 mg daily (20 mg in the morning and 20 mg in the afternoon).  Will check BMET in 2 weeks.  Discussed daily weights, sodium and fluid recommendations.  Continue metoprolol.   2. Palpitations/SVT:  Cardiac monitor in 11/2020 in setting of palpitations showed infrequent SVT, one 3.7-second pause that likely occurred while patient was sleeping.  Denies any recent palpitations.  He was evaluated by EP and was considered stable. Continue  metoprolol.  3. Mild nonobstructive CAD: Coronary CT angiogram in 08/2020 coronary calcium score of 70, mild CAD.  Denies any symptoms concerning for angina.  Continue Lipitor.  4. History of PE/DVT: Continue Eliquis.   5. Hyperlipidemia: LDL was 33 in 12/2021.  Continue Lipitor.  6. Disposition: Follow-up in 1 month.     Lenna Sciara, NP 07/29/2022, 8:00 PM

## 2022-08-11 LAB — BASIC METABOLIC PANEL
BUN/Creatinine Ratio: 8 — ABNORMAL LOW (ref 9–20)
BUN: 9 mg/dL (ref 6–24)
CO2: 23 mmol/L (ref 20–29)
Calcium: 8.9 mg/dL (ref 8.7–10.2)
Chloride: 105 mmol/L (ref 96–106)
Creatinine, Ser: 1.06 mg/dL (ref 0.76–1.27)
Glucose: 60 mg/dL — ABNORMAL LOW (ref 70–99)
Potassium: 4.2 mmol/L (ref 3.5–5.2)
Sodium: 142 mmol/L (ref 134–144)
eGFR: 82 mL/min/{1.73_m2} (ref >59)

## 2022-08-16 ENCOUNTER — Telehealth: Payer: Self-pay

## 2022-08-16 NOTE — Telephone Encounter (Signed)
Spoke with pt. Pt was notified of lab results. Pt will follow up as planned.

## 2022-08-23 ENCOUNTER — Other Ambulatory Visit: Payer: Self-pay | Admitting: *Deleted

## 2022-08-23 DIAGNOSIS — E611 Iron deficiency: Secondary | ICD-10-CM

## 2022-08-23 MED ORDER — FERROUS SULFATE 325 (65 FE) MG PO TBEC: 325.0000 mg | DELAYED_RELEASE_TABLET | Freq: Every day | ORAL | 3 refills | Status: DC

## 2022-08-25 ENCOUNTER — Ambulatory Visit (INDEPENDENT_AMBULATORY_CARE_PROVIDER_SITE_OTHER): Payer: Medicaid Other | Admitting: Physician Assistant

## 2022-08-25 VITALS — BP 109/64 | HR 76

## 2022-08-25 DIAGNOSIS — R8271 Bacteriuria: Secondary | ICD-10-CM

## 2022-08-25 DIAGNOSIS — Z87440 Personal history of urinary (tract) infections: Secondary | ICD-10-CM

## 2022-08-25 DIAGNOSIS — N138 Other obstructive and reflux uropathy: Secondary | ICD-10-CM

## 2022-08-25 DIAGNOSIS — N35919 Unspecified urethral stricture, male, unspecified site: Secondary | ICD-10-CM

## 2022-08-25 DIAGNOSIS — N390 Urinary tract infection, site not specified: Secondary | ICD-10-CM

## 2022-08-25 DIAGNOSIS — N401 Enlarged prostate with lower urinary tract symptoms: Secondary | ICD-10-CM | POA: Diagnosis not present

## 2022-08-25 LAB — URINALYSIS, ROUTINE W REFLEX MICROSCOPIC
Bilirubin, UA: NEGATIVE
Glucose, UA: NEGATIVE
Ketones, UA: NEGATIVE
Nitrite, UA: POSITIVE — AB
Specific Gravity, UA: 1.02 (ref 1.005–1.030)
Urobilinogen, Ur: 0.2 mg/dL (ref 0.2–1.0)
pH, UA: 5.5 (ref 5.0–7.5)

## 2022-08-25 LAB — MICROSCOPIC EXAMINATION: WBC, UA: >30 /hpf — AB (ref 0–5)

## 2022-08-25 NOTE — Progress Notes (Signed)
Assessment: 1. Frequent UTI - PR COMPLEX UROFLOWMETRY - Urine Culture - Urinalysis, Routine w reflex microscopic  2. Benign prostatic hyperplasia with urinary obstruction  3. Stricture of male urethra, unspecified stricture type  4. Bacteria in urine    Plan: Urine sent for cx. Will tx if indicated by results. FU in 3 months for UA and PVR     Chief Complaint: No chief complaint on file.   HPI: [name] is a 56 y.o. male who presents for continued evaluation of BPH, urethral stricture, and frequent UTIs.  Since his last visit, he states he has been doing well and has no urinary complaints.  He reports his stream is strong without the need to strain.  No intermittency, hesitancy, incomplete emptying..  Flow/PVR = Max flow 11 mL/s, average flow 6 mL/s, voiding time 14 seconds, flow time 13 seconds, time to peak flow 6 seconds..  Patient voided 75 mL  PVR = 100 mL UA= greater than 30 WBCs, hyaline casts, many bacteria, nitrite positive IPSS=  0         QOL=0  05/26/22 [name] is a 56yo here for followup for BPH, urethral stricture and frequenct UTI. No UTIs since last visit His urine stream is strong. No straining to urinate. No urinary hesitancy. IPSS 4 QOL 1. No other complaints   Portions of the above documentation were copied from a prior visit for review purposes only.  Allergies: Allergies  Allergen Reactions   Penicillins Anaphylaxis   Bactrim [Sulfamethoxazole-Trimethoprim] Other (See Comments)    ABD CRAMPS AND DIARRHEA Welps/itch   Oxycodone-Acetaminophen Other (See Comments)    Other reaction(s): Swelling   Remicade [Infliximab] Other (See Comments)    COULDN'T BREATHE   Aspirin     REACTION: unknown reaction   Codeine     REACTION: unknown reaction   Ibuprofen     REACTION: unknown reaction   Macrobid [Nitrofurantoin] Itching   Tramadol Nausea Only    Other reaction(s): Nausea   Tramadol Hcl     REACTION: unknown reaction   Vicodin  [Hydrocodone-Acetaminophen] Nausea Only    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[place])    right, s/p replacement, due to prednisone   Bipolar disorder ([place])    Colostomy in place [facility])    Crohn's colitis ([place])    s/p total colectomy with ileostomy in 2009   Crohn's disease of small intestine [facility]) Sept 2012   ileoscopy: multiple ulcers likely secondary to [place], HX OF 02/11/2009   Qualifier: Diagnosis of  By: Zeb Comfort     Depression    DVT (deep venous thrombosis) ([place]) 2009   right upper extremity due to PICC   Enterococcus UTI 2009   GERD (gastroesophageal reflux disease)    Hernia    Hyperlipidemia    IDA (iron deficiency anemia) 06/17/2022   Insomnia    Migraine headache    Nystagmus    PULMONARY EMBOLISM, HX OF 02/11/2009   Qualifier: Diagnosis of  By: Zeb Comfort     S/P endoscopy Sept 2012   mild gastritis, small hiatal hernia, no ulcers   Schizophrenia (HCC)    Schizophrenia, acute (HCC)     PSH: Past Surgical History:  Procedure Laterality Date   APPENDECTOMY     CYSTOSCOPY WITH DIRECT VISION INTERNAL URETHROTOMY N/A 03/25/2022   Procedure: CYSTOSCOPY WITH DIRECT VISION INTERNAL  URETHROTOMY;  Surgeon: Cleon Gustin, MD;  Location: [facility];  Service: Urology;  Laterality: N/A;   ESOPHAGOGASTRODUODENOSCOPY  07/2010   gastritis,  bx neg for H.Pylori   ESOPHAGOGASTRODUODENOSCOPY N/A 01/08/2014   SLF:NO SOURCE FOR ODYNOPHAGIA IDENTIFIED/Multiple small ulcers in the gastric antrum   EYE SURGERY     ILEOSCOPY  06/29/2011   SLF: ulcers,multiple/small HH/mild gastritis   KIDNEY SURGERY     LAPAROTOMY N/A 06/05/2013   Procedure: EXPLORATORY LAPAROTOMY;  Surgeon: Donato Heinz, MD;  Location: [facility];  Service: General;  Laterality: N/A;   LYSIS OF ADHESION N/A 06/05/2013   Procedure: LYSIS OF ADHESIONS;  Surgeon: Donato Heinz, MD;  Location: [facility];  Service:  General;  Laterality: N/A;   small bowel capsule study  08/2010   few tiny nonbleeding erosions/ulcers ?secondary to relafen or Crohn's. Entire small bowel not seen.    TOTAL COLECTOMY  2009   with ileostomy at [facility] for refractory disease    TOTAL HIP ARTHROPLASTY     right, due to avascular necrosis from chronic prednisone   TOTAL SHOULDER REPLACEMENT     bilateral   TRANSURETHRAL RESECTION OF PROSTATE N/A 12/03/2019   Procedure: TRANSURETHRAL RESECTION OF THE PROSTATE (TURP);  Surgeon: Cleon Gustin, MD;  Location: [facility];  Service: Urology;  Laterality: N/A;    SH: Social History   Tobacco Use   Smoking status: Every Day    Packs/day: 4.00    Years: 37.00    Total pack years: 148.00    Types: Cigarettes   Smokeless tobacco: Never   Tobacco comments:    down to 4ppd 11/14/20  Vaping Use   Vaping Use: Never used  Substance Use Topics   Alcohol use: No   Drug use: No    ROS: All other review of systems were reviewed and are negative except what is noted above in HPI  PE: BP 109/64   Pulse 76  GENERAL APPEARANCE:  Well appearing, well developed, well nourished, NAD HEENT:  Atraumatic, normocephalic NECK:  Supple. Trachea midline ABDOMEN:  Soft, non-tender, no masses EXTREMITIES:  Moves all extremities well, without clubbing, cyanosis, or edema NEUROLOGIC:  Alert and oriented x 3, normal gait, CN II-XII grossly intact MENTAL STATUS:  appropriate BACK:  Non-tender to palpation, No CVAT SKIN:  Warm, dry, and intact   Results: Laboratory Data: Lab Results  Component Value Date   WBC 3.1 (L) 06/10/2022   HGB 11.6 (L) 06/10/2022   HCT 35.9 (L) 06/10/2022   MCV 102.6 (H) 06/10/2022   PLT 57 (L) 06/10/2022    Lab Results  Component Value Date   CREATININE 1.06 08/09/2022    No results found for: "PSA"  No results found for: "TESTOSTERONE"  Lab Results  Component Value Date   HGBA1C 5.2 02/23/2022    Urinalysis    Component Value Date/Time    COLORURINE YELLOW 05/16/2021 1411   APPEARANCEUR Cloudy (A) 03/03/2022 1103   LABSPEC 1.016 05/16/2021 1411   PHURINE 6.0 05/16/2021 1411   GLUCOSEU Negative 03/03/2022 1103   HGBUR SMALL (A) 05/16/2021 1411   BILIRUBINUR Negative 03/03/2022 1103   KETONESUR NEGATIVE 05/16/2021 1411   PROTEINUR 1+ (A) 03/03/2022 1103   PROTEINUR NEGATIVE 05/16/2021 1411   UROBILINOGEN 0.2 10/17/2014 1940   NITRITE Positive (A) 03/03/2022 1103   NITRITE POSITIVE (A) 05/16/2021 1411   LEUKOCYTESUR 2+ (A) 03/03/2022 1103   LEUKOCYTESUR TRACE (A) 05/16/2021 1411    Lab Results  Component Value Date  LABMICR See below: 03/03/2022   WBCUA >30 (A) 03/03/2022   LABEPIT 0-10 03/03/2022   MUCUS Present 02/03/2022   BACTERIA Many (A) 03/03/2022    Pertinent Imaging: No results found for this or any previous visit.  Results for orders placed during the hospital encounter of 03/10/22  US Venous Img Lower Bilateral  Narrative CLINICAL DATA:  Bilateral leg swelling right-greater-than-left for 1 month, history of DVT. On Eliquis.  EXAM: BILATERAL LOWER EXTREMITY VENOUS DOPPLER ULTRASOUND  TECHNIQUE: Gray-scale sonography with compression, as well as color and duplex ultrasound, were performed to evaluate the deep venous system(s) from the level of the common femoral vein through the popliteal and proximal calf veins.  COMPARISON:  Ultrasound December 02, 2020.  FINDINGS: VENOUS  Normal compressibility of the common femoral, superficial femoral, and popliteal veins, as well as the visualized calf veins. Visualized portions of profunda femoral vein and great saphenous vein unremarkable. No filling defects to suggest DVT on grayscale or color Doppler imaging. Doppler waveforms show normal direction of venous flow, normal respiratory plasticity and response to augmentation.  Limited views of the contralateral common femoral vein are unremarkable.  OTHER  None.  Limitations:  none  IMPRESSION: No evidence of deep venous thrombosis.   Electronically Signed By: Dahlia Bailiff M.D. On: 03/10/2022 09:15  No results found for this or any previous visit.  No results found for this or any previous visit.  Results for orders placed during the hospital encounter of 04/27/16  US Renal  Narrative CLINICAL DATA:  Bladder neck alpha obstruction. Recurrent UTIs. History of Crohn's disease. Colostomy. EXAM: RENAL / URINARY TRACT ULTRASOUND COMPLETE COMPARISON:  CT 03/05/2014 . FINDINGS: Right Kidney: Length: 12.6 cm. Echogenicity within normal limits. No mass or hydronephrosis visualized. Left Kidney: Length: 11.9 cm. Echogenicity within normal limits. No mass or hydronephrosis visualized. Bladder: Appears normal for degree of bladder distention. Bilateral ureteral jets noted. IMPRESSION: No acute or focal abnormality. Electronically Signed By: Marcello Moores  Register On: 04/27/2016 13:37  No valid procedures specified. No results found for this or any previous visit.  No results found for this or any previous visit.  No results found for this or any previous visit (from the past 24 hour(s)).

## 2022-08-25 NOTE — Progress Notes (Signed)
Uroflow  Peak Flow: 11 ml Average Flow: 6 ml Voided Volume: 75 ml Voiding Time: 14 sec Flow Time: 13 sec Time to Peak Flow: 6 sec  PVR Volume: 100 ml

## 2022-08-29 LAB — URINE CULTURE

## 2022-08-30 ENCOUNTER — Telehealth: Payer: Self-pay

## 2022-08-30 ENCOUNTER — Other Ambulatory Visit: Payer: Self-pay | Admitting: Physician Assistant

## 2022-08-30 MED ORDER — CEFDINIR 300 MG PO CAPS: 300.0000 mg | ORAL_CAPSULE | Freq: Two times a day (BID) | ORAL | 0 refills | Status: DC

## 2022-08-30 NOTE — Telephone Encounter (Signed)
-----   Message from Sherrilyn Rist, [place] sent at 08/30/2022 11:51 AM EST -----  ----- Message ----- From: Reynaldo Minium, PA-C Sent: 08/30/2022   9:39 AM EST To: Sherrilyn Rist, CMA  Please let the patient or his caretaker know that his urine culture indicates resistant bacteria.  Due to his allergies and the resistance, the best option for p.o. treatment is Omnicef.  He has tolerated Keflex in the past and this is in the same family of medications.  I sent this to the [place] in his chart. ----- Message ----- From: Lavone Neri Lab Results In Sent: 08/25/2022   3:36 PM EST To: Reynaldo Minium, PA-C

## 2022-08-30 NOTE — Progress Notes (Signed)
Urine culture indicates E. coli with resistant to quinolones.  Patient has allergies to sulfa and Bactrim Vantin as well as penicillins.  He has tolerated cephalosporins in the past.  Omnicef sent to his pharmacy for treatment.

## 2022-08-30 NOTE — Telephone Encounter (Signed)
Patient called and made aware of positive urine culture and Omnicef sent to pharmacy per PA

## 2022-09-03 ENCOUNTER — Encounter: Payer: Self-pay | Admitting: Nurse Practitioner

## 2022-09-03 ENCOUNTER — Ambulatory Visit: Payer: Medicaid Other | Attending: Nurse Practitioner | Admitting: Nurse Practitioner

## 2022-09-03 VITALS — BP 98/58 | HR 80 | Ht 66.0 in | Wt 221.6 lb

## 2022-09-03 DIAGNOSIS — R002 Palpitations: Secondary | ICD-10-CM

## 2022-09-03 DIAGNOSIS — I471 Supraventricular tachycardia, unspecified: Secondary | ICD-10-CM | POA: Diagnosis not present

## 2022-09-03 DIAGNOSIS — R0602 Shortness of breath: Secondary | ICD-10-CM | POA: Diagnosis not present

## 2022-09-03 DIAGNOSIS — Z86718 Personal history of other venous thrombosis and embolism: Secondary | ICD-10-CM

## 2022-09-03 DIAGNOSIS — R600 Localized edema: Secondary | ICD-10-CM | POA: Diagnosis not present

## 2022-09-03 DIAGNOSIS — Z86711 Personal history of pulmonary embolism: Secondary | ICD-10-CM | POA: Diagnosis present

## 2022-09-03 DIAGNOSIS — E785 Hyperlipidemia, unspecified: Secondary | ICD-10-CM | POA: Diagnosis present

## 2022-09-03 DIAGNOSIS — I2510 Atherosclerotic heart disease of native coronary artery without angina pectoris: Secondary | ICD-10-CM

## 2022-09-03 NOTE — Patient Instructions (Addendum)
Medication Instructions:  Your physician recommends that you continue on your current medications as directed. Please refer to the Current Medication list given to you today.  *If you need a refill on your cardiac medications before your next appointment, please call your pharmacy*   Lab Work: NONE ordered at this time of appointment   If you have labs (blood work) drawn today and your tests are completely normal, you will receive your results only by: [place] (if you have MyChart) OR A paper copy in the mail If you have any lab test that is abnormal or we need to change your treatment, we will call you to review the results.   Testing/Procedures: NONE ordered at this time of appointment     Follow-Up: At [facility], you and your health needs are our priority.  As part of our continuing mission to provide you with exceptional heart care, we have created designated Provider Care Teams.  These Care Teams include your primary Cardiologist (physician) and Advanced Practice Providers (APPs -  Physician Assistants and Nurse Practitioners) who all work together to provide you with the care you need, when you need it.  We recommend signing up for the patient portal called "MyChart".  Sign up information is provided on this After Visit Summary.  MyChart is used to connect with patients for Virtual Visits (Telemedicine).  Patients are able to view lab/test results, encounter notes, upcoming appointments, etc.  Non-urgent messages can be sent to your provider as well.   To learn more about what you can do with MyChart, go to [email or web address].    Your next appointment:   3-5 month(s)  The format for your next appointment:   In Person  Provider:   Elouise Munroe, MD     Other Instructions   Important Information About Sugar

## 2022-09-03 NOTE — Progress Notes (Signed)
Office Visit    Patient Name: [name] Date of Encounter: 09/03/2022  Primary Care Provider:  [name], MD Primary Cardiologist:  Elouise Munroe, MD  Chief Complaint    56 year old male with a history of SVT, mild nonobstructive CAD, bilateral lower extremity edema, palpitations, DVT, PE, hyperlipidemia, legal blindness, Crohn's disease s/p total colectomy and ileostomy in 2009, COPD, tobacco use, and schizophrenia who presents for follow-up related to lower extremity edema and shortness of breath.  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[place])    right, s/p replacement, due to prednisone   Bipolar disorder ([place])    Colostomy in place [facility])    Crohn's colitis ([place])    s/p total colectomy with ileostomy in 2009   Crohn's disease of small intestine [facility]) Sept 2012   ileoscopy: multiple ulcers likely secondary to [place], HX OF 02/11/2009   Qualifier: Diagnosis of  By: Zeb Comfort     Depression    DVT (deep venous thrombosis) ([place]) 2009   right upper extremity due to PICC   Enterococcus UTI 2009   GERD (gastroesophageal reflux disease)    Hernia    Hyperlipidemia    IDA (iron deficiency anemia) 06/17/2022   Insomnia    Migraine headache    Nystagmus    PULMONARY EMBOLISM, HX OF 02/11/2009   Qualifier: Diagnosis of  By: Zeb Comfort     S/P endoscopy Sept 2012   mild gastritis, small hiatal hernia, no ulcers   Schizophrenia ([place])    Schizophrenia, acute ([place])    Past Surgical History:  Procedure Laterality Date   APPENDECTOMY     CYSTOSCOPY WITH DIRECT VISION INTERNAL URETHROTOMY N/A 03/25/2022   Procedure: CYSTOSCOPY WITH DIRECT VISION INTERNAL URETHROTOMY;  Surgeon: Cleon Gustin, MD;  Location: [facility];  Service: Urology;  Laterality: N/A;   ESOPHAGOGASTRODUODENOSCOPY  07/2010   gastritis,  bx neg for H.Pylori    ESOPHAGOGASTRODUODENOSCOPY N/A 01/08/2014   SLF:NO SOURCE FOR ODYNOPHAGIA IDENTIFIED/Multiple small ulcers in the gastric antrum   EYE SURGERY     ILEOSCOPY  06/29/2011   SLF: ulcers,multiple/small HH/mild gastritis   KIDNEY SURGERY     LAPAROTOMY N/A 06/05/2013   Procedure: EXPLORATORY LAPAROTOMY;  Surgeon: Donato Heinz, MD;  Location: [facility];  Service: General;  Laterality: N/A;   LYSIS OF ADHESION N/A 06/05/2013   Procedure: LYSIS OF ADHESIONS;  Surgeon: Donato Heinz, MD;  Location: [facility];  Service: General;  Laterality: N/A;   small bowel capsule study  08/2010   few tiny nonbleeding erosions/ulcers ?secondary to relafen or Crohn's. Entire small bowel not seen.    TOTAL COLECTOMY  2009   with ileostomy at [facility] for refractory disease    TOTAL HIP ARTHROPLASTY     right, due to avascular necrosis from chronic prednisone   TOTAL SHOULDER REPLACEMENT     bilateral   TRANSURETHRAL RESECTION OF PROSTATE N/A 12/03/2019   Procedure: TRANSURETHRAL RESECTION OF THE PROSTATE (TURP);  Surgeon: Cleon Gustin, MD;  Location: [facility];  Service: Urology;  Laterality: N/A;    Allergies  Allergies  Allergen Reactions   Penicillins Anaphylaxis   Bactrim [Sulfamethoxazole-Trimethoprim] Other (See Comments)    ABD CRAMPS AND DIARRHEA Welps/itch   Oxycodone-Acetaminophen Other (See Comments)    Other reaction(s): Swelling   Remicade [Infliximab] Other (See Comments)  COULDN'T BREATHE   Aspirin     REACTION: unknown reaction   Codeine     REACTION: unknown reaction   Ibuprofen     REACTION: unknown reaction   Macrobid [Nitrofurantoin] Itching   Tramadol Nausea Only    Other reaction(s): Nausea   Tramadol Hcl     REACTION: unknown reaction   Vicodin [Hydrocodone-Acetaminophen] Nausea Only    History of Present Illness    56 year old male with the above past medical history including SVT, mild nonobstructive CAD, bilateral lower extremity edema, palpitations, DVT, PE,  hyperlipidemia, legal blindness, Crohn's disease s/p total colectomy and ileostomy in 2009, COPD, tobacco use, and schizophrenia.  He has a history of palpitations and precordial chest pain.  Coronary CT angiogram in 08/2020 coronary calcium score of 70, mild CAD.  Echocardiogram in 08/2020 showed EF 45 to 50%, increased LV function, LV global hypokinesis, normal RV systolic function, no significant valvular abnormalities.  Repeat echocardiogram in 11/2020 showed EF 55 to 60%, normal LV function, mildly dilated left atrium, mild aortic valve sclerosis with no evidence of aortic valve stenosis.  Cardiac monitor in 11/2020 in setting of palpitations showed infrequent SVT, one 3.7-second pause that likely occurred while patient was sleeping.  He was referred to EP and was determined to be stable.   He was hospitalized in June 2023 in the setting of bladder neck contracture s/p cystoscopy with direct vision internal urethrotomy.  He was last seen in the office on 07/29/2022 noted increased bilateral lower extremity edema, dyspnea on exertion, weight gain.  He was transitioned from Lasix to torsemide 40 mg daily.   He presents today for follow-up. Since his last visit he has been stable from a cardiac standpoint.  His lower extremity edema has improved with the transition from Lasix to torsemide.  He is no longer short of breath.  He did note a episode of mild chest discomfort that occurred today and lasted for 2 to 3 minutes.  He described the feeling as "someone punching me in the chest."  He denies other symptoms concerning for angina.  Overall, he reports feeling well.  Home Medications    Current Outpatient Medications  Medication Sig Dispense Refill   ACCU-CHEK AVIVA PLUS test strip SMARTSIG:Via Meter     albuterol (PROVENTIL) (2.5 MG/3ML) 0.083% nebulizer solution Take 2.5 mg by nebulization every 6 (six) hours as needed for wheezing or shortness of breath.     albuterol (VENTOLIN HFA) 108 (90 Base)  MCG/ACT inhaler Inhale 2 puffs into the lungs every 6 (six) hours as needed for wheezing or shortness of breath.     atorvastatin (LIPITOR) 80 MG tablet Take 80 mg by mouth daily.     azelastine (ASTELIN) 0.1 % nasal spray Place 2 sprays into both nostrils 2 (two) times daily.     baclofen (LIORESAL) 10 MG tablet Take 10 mg by mouth daily.     benztropine (COGENTIN) 0.5 MG tablet Take 0.5 mg by mouth daily.      CALCIUM 600/VITAMIN D3 600-800 MG-UNIT TABS TAKE (1) TABLET BY MOUTH THREE TIMES DAILY AFTER MEALS. 84 tablet 11   cefdinir (OMNICEF) 300 MG capsule Take 1 capsule (300 mg total) by mouth 2 (two) times daily. 14 capsule 0   cyanocobalamin 1000 MCG tablet Take 1,000 mcg by mouth daily.     diclofenac Sodium (VOLTAREN) 1 % GEL as directed Externally twice a day for 5 days     dicyclomine (BENTYL) 10 MG capsule TAKE 1 CAPSULE BY  MOUTH BEFORE MEALS AND AT BEDTIME AS NEEDED FOR ABDOMINAL CRAMPING. *HOLD FOR CONSTIPATION* 90 capsule 11   divalproex (DEPAKOTE) 250 MG DR tablet Take by mouth at bedtime.     divalproex (DEPAKOTE) 500 MG DR tablet Take 500 mg by mouth 2 (two) times daily.     docusate sodium (COLACE) 100 MG capsule Take 1 capsule (100 mg total) by mouth 2 (two) times daily. 10 capsule 0   ELIQUIS 5 MG TABS tablet Take 5 mg by mouth 2 (two) times daily.     ferrous sulfate 325 (65 FE) MG EC tablet Take 1 tablet (325 mg total) by mouth daily with breakfast. 30 tablet 3   FLOVENT HFA 110 MCG/ACT inhaler Inhale 2 puffs into the lungs 2 (two) times daily.     folic acid (FOLVITE) 1 MG tablet Take 1 tablet by mouth daily.     gabapentin (NEURONTIN) 300 MG capsule Take 900 mg by mouth 3 (three) times daily.     hydroxychloroquine (PLAQUENIL) 200 MG tablet Take 1 tablet by mouth 2 (two) times daily.     meloxicam (MOBIC) 7.5 MG tablet Take 7.5 mg by mouth daily.     methocarbamol (ROBAXIN) 500 MG tablet Take 500 mg by mouth 3 (three) times daily.     metoprolol succinate (TOPROL-XL) 25 MG  24 hr tablet Take 1 tablet (25 mg total) by mouth in the morning and at bedtime. Take with or immediately following a meal. 180 tablet 2   mirtazapine (REMERON) 15 MG tablet Take 15 mg by mouth at bedtime.     Misc. Devices (WALKER WHEELS) MISC USE AS DIRECTED 1 each 0   mometasone-formoterol (DULERA) 200-5 MCG/ACT AERO Inhale 2 puffs into the lungs in the morning and at bedtime. 13 g 5   Ostomy Supplies (ACTIVE LIFE 1-PC DRAIN [phone]) Pouch MISC      oxyCODONE-acetaminophen (PERCOCET) 5-325 MG tablet Take 1 tablet by mouth every 4 (four) hours as needed for severe pain. 15 tablet 0   pantoprazole (PROTONIX) 40 MG tablet TAKE (1) TABLET BY MOUTH TWICE A DAY BEFORE MEALS. (BREAKFAST AND SUPPER) 56 tablet 11   Potassium Chloride ER 20 MEQ TBCR Take 1 tablet by mouth daily. Take for 28 days     predniSONE (DELTASONE) 20 MG tablet Take by mouth.     risperiDONE (RISPERDAL) 3 MG tablet Take 3 mg by mouth 2 (two) times daily.     sertraline (ZOLOFT) 50 MG tablet Take 50 mg by mouth daily.     tamsulosin (FLOMAX) 0.4 MG CAPS capsule Take 0.4 mg by mouth daily.     Tiotropium Bromide Monohydrate (SPIRIVA RESPIMAT) 2.5 MCG/ACT AERS Inhale 2 puffs into the lungs daily. 4 g 5   torsemide (DEMADEX) 20 MG tablet Take 1 tablet (20 mg total) by mouth 2 (two) times daily. 180 tablet 3   traZODone (DESYREL) 100 MG tablet Take 100 mg by mouth at bedtime.     nitroGLYCERIN (NITROSTAT) 0.4 MG SL tablet Place 1 tablet (0.4 mg total) under the tongue every 5 (five) minutes as needed for chest pain. 25 tablet 3   No current facility-administered medications for this visit.     Review of Systems    He denies palpitations, dyspnea, pnd, orthopnea, n, v, dizziness, syncope, weight gain, or early satiety. All other systems reviewed and are otherwise negative except as noted above.   Physical Exam    VS:  BP (!) 98/58 (BP Location: Left Arm, Patient Position: Sitting,  Cuff Size: Large)   Pulse 80   Ht 5' 6"  (1.676  m)   Wt 221 lb 9.6 oz (100.5 kg)   SpO2 94%   BMI 35.77 kg/m   GEN: Well nourished, well developed, in no acute distress. HEENT: normal. Neck: Supple, no JVD, carotid bruits, or masses. Cardiac: RRR, no murmurs, rubs, or gallops. No clubbing, cyanosis, nonpitting bilateral edema.  Radials/DP/PT 2+ and equal bilaterally.  Respiratory:  Respirations regular and unlabored, clear to auscultation bilaterally. GI: Soft, nontender, nondistended, BS + x 4. MS: no deformity or atrophy. Skin: warm and dry, no rash. Neuro:  Strength and sensation are intact. Psych: Normal affect.  Accessory Clinical Findings    ECG personally reviewed by me today -NSR, 77 bpm- no acute changes.   Lab Results  Component Value Date   WBC 3.1 (L) 06/10/2022   HGB 11.6 (L) 06/10/2022   HCT 35.9 (L) 06/10/2022   MCV 102.6 (H) 06/10/2022   PLT 57 (L) 06/10/2022   Lab Results  Component Value Date   CREATININE 1.06 08/09/2022   BUN 9 08/09/2022   NA 142 08/09/2022   K 4.2 08/09/2022   CL 105 08/09/2022   CO2 23 08/09/2022   Lab Results  Component Value Date   ALT 35 03/10/2022   AST 68 (H) 03/10/2022   ALKPHOS 114 03/10/2022   BILITOT 1.1 03/10/2022   Lab Results  Component Value Date   CHOL 90 (L) 12/22/2021   HDL 43 12/22/2021   LDLCALC 33 12/22/2021   TRIG 59 12/22/2021   CHOLHDL 2.1 12/22/2021    Lab Results  Component Value Date   HGBA1C 5.2 02/23/2022    Assessment & Plan    1. Bilateral lower extremity edema/shortness of breath: Most recent echo in 11/2020 showed EF 55 to 60%, normal LV function, mildly dilated left atrium, mild aortic valve sclerosis with no evidence of aortic valve stenosis. Bilateral lower extremity edema improved with transition from Lasix to torsemide. He is no longer short of breath. Weight is stable. Discussed ongoing monitoring with daily weights, sodium and fluid recommendations.  Continue metoprolol, torsemide.    2. Palpitations/SVT:  Cardiac monitor in  11/2020 in setting of palpitations showed infrequent SVT, one 3.7-second pause that likely occurred while patient was sleeping.  Denies any recent palpitations.  He was evaluated by EP and was considered stable. Continue metoprolol.    3. Mild nonobstructive CAD: Coronary CT angiogram in 08/2020 coronary calcium score of 70, mild CAD.  Mild chest discomfort today only that he describes as "someone punching me in the chest."  The feeling lasted for 2-3 minutes and resolved. EKG unremarkable. If symptoms persist, consider additional ischemic evaluation. Continue metoprolol, Lipitor.    4. History of PE/DVT: Continue Eliquis.    5. Hyperlipidemia: LDL was 33 in 12/2021. Continue Lipitor.   6. Disposition: Follow-up in 3-5 months with Dr. Margaretann Loveless.      Lenna Sciara, NP 09/03/2022, 4:13 PM

## 2022-09-06 ENCOUNTER — Other Ambulatory Visit: Payer: Self-pay

## 2022-09-06 ENCOUNTER — Emergency Department (HOSPITAL_COMMUNITY): Payer: Medicaid Other

## 2022-09-06 ENCOUNTER — Encounter (HOSPITAL_COMMUNITY): Payer: Self-pay | Admitting: *Deleted

## 2022-09-06 ENCOUNTER — Inpatient Hospital Stay (HOSPITAL_COMMUNITY)
Admission: EM | Admit: 2022-09-06 | Discharge: 2022-09-09 | DRG: 378 | Disposition: A | Discharge status: Nursing Home (Includes ICF) | Payer: Medicaid Other | Attending: Family Medicine | Admitting: Family Medicine

## 2022-09-06 DIAGNOSIS — E785 Hyperlipidemia, unspecified: Secondary | ICD-10-CM | POA: Diagnosis present

## 2022-09-06 DIAGNOSIS — K766 Portal hypertension: Secondary | ICD-10-CM | POA: Diagnosis present

## 2022-09-06 DIAGNOSIS — Z882 Allergy status to sulfonamides status: Secondary | ICD-10-CM

## 2022-09-06 DIAGNOSIS — K922 Gastrointestinal hemorrhage, unspecified: Secondary | ICD-10-CM

## 2022-09-06 DIAGNOSIS — K921 Melena: Secondary | ICD-10-CM

## 2022-09-06 DIAGNOSIS — F319 Bipolar disorder, unspecified: Secondary | ICD-10-CM | POA: Diagnosis present

## 2022-09-06 DIAGNOSIS — Z96641 Presence of right artificial hip joint: Secondary | ICD-10-CM | POA: Diagnosis present

## 2022-09-06 DIAGNOSIS — E119 Type 2 diabetes mellitus without complications: Secondary | ICD-10-CM

## 2022-09-06 DIAGNOSIS — Z7901 Long term (current) use of anticoagulants: Secondary | ICD-10-CM

## 2022-09-06 DIAGNOSIS — R109 Unspecified abdominal pain: Secondary | ICD-10-CM

## 2022-09-06 DIAGNOSIS — Z888 Allergy status to other drugs, medicaments and biological substances status: Secondary | ICD-10-CM

## 2022-09-06 DIAGNOSIS — K5080 Crohn's disease of both small and large intestine without complications: Secondary | ICD-10-CM | POA: Diagnosis present

## 2022-09-06 DIAGNOSIS — D509 Iron deficiency anemia, unspecified: Secondary | ICD-10-CM | POA: Diagnosis not present

## 2022-09-06 DIAGNOSIS — K31811 Angiodysplasia of stomach and duodenum with bleeding: Principal | ICD-10-CM | POA: Diagnosis present

## 2022-09-06 DIAGNOSIS — K3189 Other diseases of stomach and duodenum: Secondary | ICD-10-CM | POA: Diagnosis present

## 2022-09-06 DIAGNOSIS — Z791 Long term (current) use of non-steroidal anti-inflammatories (NSAID): Secondary | ICD-10-CM

## 2022-09-06 DIAGNOSIS — Z1152 Encounter for screening for COVID-19: Secondary | ICD-10-CM

## 2022-09-06 DIAGNOSIS — E440 Moderate protein-calorie malnutrition: Secondary | ICD-10-CM | POA: Diagnosis present

## 2022-09-06 DIAGNOSIS — E669 Obesity, unspecified: Secondary | ICD-10-CM | POA: Diagnosis present

## 2022-09-06 DIAGNOSIS — D696 Thrombocytopenia, unspecified: Secondary | ICD-10-CM

## 2022-09-06 DIAGNOSIS — Z9049 Acquired absence of other specified parts of digestive tract: Secondary | ICD-10-CM

## 2022-09-06 DIAGNOSIS — F17210 Nicotine dependence, cigarettes, uncomplicated: Secondary | ICD-10-CM | POA: Diagnosis present

## 2022-09-06 DIAGNOSIS — Z825 Family history of asthma and other chronic lower respiratory diseases: Secondary | ICD-10-CM

## 2022-09-06 DIAGNOSIS — D61818 Other pancytopenia: Secondary | ICD-10-CM | POA: Insufficient documentation

## 2022-09-06 DIAGNOSIS — Z933 Colostomy status: Secondary | ICD-10-CM

## 2022-09-06 DIAGNOSIS — Z96611 Presence of right artificial shoulder joint: Secondary | ICD-10-CM | POA: Diagnosis present

## 2022-09-06 DIAGNOSIS — Z86711 Personal history of pulmonary embolism: Secondary | ICD-10-CM | POA: Insufficient documentation

## 2022-09-06 DIAGNOSIS — Z6835 Body mass index (BMI) 35.0-35.9, adult: Secondary | ICD-10-CM

## 2022-09-06 DIAGNOSIS — M069 Rheumatoid arthritis, unspecified: Secondary | ICD-10-CM | POA: Diagnosis present

## 2022-09-06 DIAGNOSIS — Z96612 Presence of left artificial shoulder joint: Secondary | ICD-10-CM | POA: Diagnosis present

## 2022-09-06 DIAGNOSIS — Z885 Allergy status to narcotic agent status: Secondary | ICD-10-CM

## 2022-09-06 DIAGNOSIS — K449 Diaphragmatic hernia without obstruction or gangrene: Secondary | ICD-10-CM | POA: Diagnosis present

## 2022-09-06 DIAGNOSIS — Z23 Encounter for immunization: Secondary | ICD-10-CM

## 2022-09-06 DIAGNOSIS — J45909 Unspecified asthma, uncomplicated: Secondary | ICD-10-CM | POA: Diagnosis present

## 2022-09-06 DIAGNOSIS — Z881 Allergy status to other antibiotic agents status: Secondary | ICD-10-CM

## 2022-09-06 DIAGNOSIS — W19XXXA Unspecified fall, initial encounter: Secondary | ICD-10-CM

## 2022-09-06 DIAGNOSIS — Z8249 Family history of ischemic heart disease and other diseases of the circulatory system: Secondary | ICD-10-CM

## 2022-09-06 DIAGNOSIS — Z7952 Long term (current) use of systemic steroids: Secondary | ICD-10-CM

## 2022-09-06 DIAGNOSIS — E8809 Other disorders of plasma-protein metabolism, not elsewhere classified: Secondary | ICD-10-CM | POA: Diagnosis present

## 2022-09-06 DIAGNOSIS — Z886 Allergy status to analgesic agent status: Secondary | ICD-10-CM

## 2022-09-06 DIAGNOSIS — D62 Acute posthemorrhagic anemia: Secondary | ICD-10-CM | POA: Diagnosis present

## 2022-09-06 DIAGNOSIS — N179 Acute kidney failure, unspecified: Secondary | ICD-10-CM | POA: Insufficient documentation

## 2022-09-06 DIAGNOSIS — Z833 Family history of diabetes mellitus: Secondary | ICD-10-CM

## 2022-09-06 DIAGNOSIS — Z880 Allergy status to penicillin: Secondary | ICD-10-CM

## 2022-09-06 DIAGNOSIS — E46 Unspecified protein-calorie malnutrition: Secondary | ICD-10-CM | POA: Insufficient documentation

## 2022-09-06 DIAGNOSIS — Z79899 Other long term (current) drug therapy: Secondary | ICD-10-CM

## 2022-09-06 DIAGNOSIS — Z7951 Long term (current) use of inhaled steroids: Secondary | ICD-10-CM

## 2022-09-06 DIAGNOSIS — Z86718 Personal history of other venous thrombosis and embolism: Secondary | ICD-10-CM

## 2022-09-06 HISTORY — DX: Gastrointestinal hemorrhage, unspecified: K92.2

## 2022-09-06 LAB — COMPREHENSIVE METABOLIC PANEL
ALT: 22 U/L (ref 0–44)
AST: 53 U/L — ABNORMAL HIGH (ref 15–41)
Albumin: 2.1 g/dL — ABNORMAL LOW (ref 3.5–5.0)
Alkaline Phosphatase: 112 U/L (ref 38–126)
Anion gap: 7 (ref 5–15)
BUN: 19 mg/dL (ref 6–20)
CO2: 26 mmol/L (ref 22–32)
Calcium: 8.1 mg/dL — ABNORMAL LOW (ref 8.9–10.3)
Chloride: 100 mmol/L (ref 98–111)
Creatinine, Ser: 1.44 mg/dL — ABNORMAL HIGH (ref 0.61–1.24)
GFR, Estimated: 57 mL/min — ABNORMAL LOW (ref >60)
Glucose, Bld: 132 mg/dL — ABNORMAL HIGH (ref 70–99)
Potassium: 4.1 mmol/L (ref 3.5–5.1)
Sodium: 133 mmol/L — ABNORMAL LOW (ref 135–145)
Total Bilirubin: 0.8 mg/dL (ref 0.3–1.2)
Total Protein: 7.1 g/dL (ref 6.5–8.1)

## 2022-09-06 LAB — PROTIME-INR
INR: 1.6 — ABNORMAL HIGH (ref 0.8–1.2)
Prothrombin Time: 18.6 seconds — ABNORMAL HIGH (ref 11.4–15.2)

## 2022-09-06 LAB — CBC WITH DIFFERENTIAL/PLATELET
Abs Immature Granulocytes: 0.02 10*3/uL (ref 0.00–0.07)
Basophils Absolute: 0 10*3/uL (ref 0.0–0.1)
Basophils Relative: 1 %
Eosinophils Absolute: 0.1 10*3/uL (ref 0.0–0.5)
Eosinophils Relative: 3 %
HCT: 32.6 % — ABNORMAL LOW (ref 39.0–52.0)
Hemoglobin: 10.4 g/dL — ABNORMAL LOW (ref 13.0–17.0)
Immature Granulocytes: 1 %
Lymphocytes Relative: 37 %
Lymphs Abs: 1 10*3/uL (ref 0.7–4.0)
MCH: 32.2 pg (ref 26.0–34.0)
MCHC: 31.9 g/dL (ref 30.0–36.0)
MCV: 100.9 fL — ABNORMAL HIGH (ref 80.0–100.0)
Monocytes Absolute: 0.2 10*3/uL (ref 0.1–1.0)
Monocytes Relative: 9 %
Neutro Abs: 1.3 10*3/uL — ABNORMAL LOW (ref 1.7–7.7)
Neutrophils Relative %: 49 %
Platelets: 47 10*3/uL — ABNORMAL LOW (ref 150–400)
RBC: 3.23 MIL/uL — ABNORMAL LOW (ref 4.22–5.81)
RDW: 15.6 % — ABNORMAL HIGH (ref 11.5–15.5)
WBC: 2.6 10*3/uL — ABNORMAL LOW (ref 4.0–10.5)
nRBC: 0 % (ref 0.0–0.2)

## 2022-09-06 LAB — URINALYSIS, ROUTINE W REFLEX MICROSCOPIC
Bilirubin Urine: NEGATIVE
Glucose, UA: NEGATIVE mg/dL
Hgb urine dipstick: NEGATIVE
Ketones, ur: NEGATIVE mg/dL
Leukocytes,Ua: NEGATIVE
Nitrite: NEGATIVE
Protein, ur: NEGATIVE mg/dL
Specific Gravity, Urine: 1.027 (ref 1.005–1.030)
pH: 6 (ref 5.0–8.0)

## 2022-09-06 LAB — RESP PANEL BY RT-PCR (RSV, FLU A&B, COVID)  RVPGX2
Influenza A by PCR: NEGATIVE
Influenza B by PCR: NEGATIVE
Resp Syncytial Virus by PCR: NEGATIVE
SARS Coronavirus 2 by RT PCR: NEGATIVE

## 2022-09-06 LAB — POC OCCULT BLOOD, ED: Fecal Occult Bld: POSITIVE — AB

## 2022-09-06 LAB — CBG MONITORING, ED: Glucose-Capillary: 104 mg/dL — ABNORMAL HIGH (ref 70–99)

## 2022-09-06 LAB — FIBRINOGEN: Fibrinogen: 139 mg/dL — ABNORMAL LOW (ref 210–475)

## 2022-09-06 LAB — TROPONIN I (HIGH SENSITIVITY): Troponin I (High Sensitivity): 3 ng/L (ref <18)

## 2022-09-06 LAB — SEDIMENTATION RATE: Sed Rate: 10 mm/hr (ref 0–16)

## 2022-09-06 MED ORDER — ALBUTEROL SULFATE (2.5 MG/3ML) 0.083% IN NEBU: 2.5000 mg | INHALATION_SOLUTION | Freq: Four times a day (QID) | RESPIRATORY_TRACT | Status: DC | PRN

## 2022-09-06 MED ORDER — SERTRALINE HCL 50 MG PO TABS
50.0000 mg | ORAL_TABLET | Freq: Every day | ORAL | Status: DC
  Administered 2022-09-08 – 2022-09-09 (×2): 50 mg via ORAL
  Filled 2022-09-06 (×3): qty 1

## 2022-09-06 MED ORDER — ONDANSETRON HCL 4 MG/2ML IJ SOLN: 4.0000 mg | Freq: Four times a day (QID) | INTRAMUSCULAR | Status: DC | PRN

## 2022-09-06 MED ORDER — IOHEXOL 300 MG/ML  SOLN
100.0000 mL | Freq: Once | INTRAMUSCULAR | Status: AC | PRN
  Administered 2022-09-06: 100 mL via INTRAVENOUS

## 2022-09-06 MED ORDER — UMECLIDINIUM BROMIDE 62.5 MCG/ACT IN AEPB
1.0000 | INHALATION_SPRAY | Freq: Every day | RESPIRATORY_TRACT | Status: DC
  Administered 2022-09-07 – 2022-09-09 (×3): 1 via RESPIRATORY_TRACT
  Filled 2022-09-06: qty 7

## 2022-09-06 MED ORDER — PANTOPRAZOLE SODIUM 40 MG IV SOLR
40.0000 mg | Freq: Once | INTRAVENOUS | Status: AC
  Administered 2022-09-06: 40 mg via INTRAVENOUS
  Filled 2022-09-06: qty 10

## 2022-09-06 MED ORDER — CIPROFLOXACIN IN D5W 400 MG/200ML IV SOLN
400.0000 mg | Freq: Two times a day (BID) | INTRAVENOUS | Status: DC
  Administered 2022-09-06: 400 mg via INTRAVENOUS
  Filled 2022-09-06: qty 200

## 2022-09-06 MED ORDER — TIOTROPIUM BROMIDE MONOHYDRATE 2.5 MCG/ACT IN AERS: 2.0000 | INHALATION_SPRAY | Freq: Every day | RESPIRATORY_TRACT | Status: DC

## 2022-09-06 MED ORDER — BUDESONIDE 0.25 MG/2ML IN SUSP
0.2500 mg | Freq: Two times a day (BID) | RESPIRATORY_TRACT | Status: DC
  Administered 2022-09-07 – 2022-09-09 (×5): 0.25 mg via RESPIRATORY_TRACT
  Filled 2022-09-06 (×5): qty 2

## 2022-09-06 MED ORDER — FLUTICASONE PROPIONATE HFA 110 MCG/ACT IN AERO: 2.0000 | INHALATION_SPRAY | Freq: Two times a day (BID) | RESPIRATORY_TRACT | Status: DC

## 2022-09-06 MED ORDER — ALBUTEROL SULFATE HFA 108 (90 BASE) MCG/ACT IN AERS: 2.0000 | INHALATION_SPRAY | Freq: Four times a day (QID) | RESPIRATORY_TRACT | Status: DC | PRN

## 2022-09-06 MED ORDER — ATORVASTATIN CALCIUM 40 MG PO TABS
80.0000 mg | ORAL_TABLET | Freq: Every day | ORAL | Status: DC
  Administered 2022-09-08 – 2022-09-09 (×2): 80 mg via ORAL
  Filled 2022-09-06 (×3): qty 2

## 2022-09-06 MED ORDER — ONDANSETRON HCL 4 MG PO TABS: 4.0000 mg | ORAL_TABLET | Freq: Four times a day (QID) | ORAL | Status: DC | PRN

## 2022-09-06 NOTE — H&P (Incomplete)
History and Physical    Patient: [name] VXB:[id number] DOB: November 19, 1965 DOA: 09/06/2022 DOS: the patient was seen and examined on 09/06/2022 PCP: [name] Schwalbe, MD  Patient coming from: ALF/ILF  Chief Complaint:  Chief Complaint  Patient presents with  . Weakness   HPI: [name] is a 56 y.o. male with medical history significant of            Review of Systems: {ROS_Text:26778} Past Medical History:  Diagnosis Date  . Arthritis   . Asthma   . 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 Bipolar disorder ([place])   . Colostomy in place [facility])   . Crohn's colitis ([place])    s/p total colectomy with ileostomy in 2009  . Crohn's disease of small intestine [facility]) Sept 2012   ileoscopy: multiple ulcers likely secondary to Crohn's  . DEEP VENOUS THROMBOPHLEBITIS, HX OF 02/11/2009   Qualifier: Diagnosis of  By: Zeb Comfort    . Depression   . DVT (deep venous thrombosis) ([place]) 2009   right upper extremity due to PICC  . Enterococcus UTI 2009  . GERD (gastroesophageal reflux disease)   . Hernia   . Hyperlipidemia   . IDA (iron deficiency anemia) 06/17/2022  . Insomnia   . Migraine headache   . Nystagmus   . PULMONARY EMBOLISM, HX OF 02/11/2009   Qualifier: Diagnosis of  By: Zeb Comfort    . S/P endoscopy Sept 2012   mild gastritis, small hiatal hernia, no ulcers  . Schizophrenia ([place])   . Schizophrenia, acute [facility]    Past Surgical History:  Procedure Laterality Date  . APPENDECTOMY    . CYSTOSCOPY WITH DIRECT VISION INTERNAL URETHROTOMY N/A 03/25/2022   Procedure: CYSTOSCOPY WITH DIRECT VISION INTERNAL URETHROTOMY;  Surgeon: Cleon Gustin, MD;  Location: [facility];  Service: Urology;  Laterality: N/A;  . ESOPHAGOGASTRODUODENOSCOPY  07/2010   gastritis,  bx neg for H.Pylori  . ESOPHAGOGASTRODUODENOSCOPY N/A 01/08/2014   SLF:NO SOURCE FOR ODYNOPHAGIA IDENTIFIED/Multiple small ulcers in the gastric antrum  . EYE  SURGERY    . ILEOSCOPY  06/29/2011   SLF: ulcers,multiple/small HH/mild gastritis  . KIDNEY SURGERY    . LAPAROTOMY N/A 06/05/2013   Procedure: EXPLORATORY LAPAROTOMY;  Surgeon: Donato Heinz, MD;  Location: [facility];  Service: General;  Laterality: N/A;  . LYSIS OF ADHESION N/A 06/05/2013   Procedure: LYSIS OF ADHESIONS;  Surgeon: Donato Heinz, MD;  Location: [facility];  Service: General;  Laterality: N/A;  . small bowel capsule study  08/2010   few tiny nonbleeding erosions/ulcers ?secondary to relafen or Crohn's. Entire small bowel not seen.   Marland Kitchen TOTAL COLECTOMY  2009   with ileostomy at [facility] for refractory disease   . TOTAL HIP ARTHROPLASTY     right, due to avascular necrosis from chronic prednisone  . TOTAL SHOULDER REPLACEMENT     bilateral  . TRANSURETHRAL RESECTION OF PROSTATE N/A 12/03/2019   Procedure: TRANSURETHRAL RESECTION OF THE PROSTATE (TURP);  Surgeon: Cleon Gustin, MD;  Location: [facility];  Service: Urology;  Laterality: N/A;   Social History:  reports that he has been smoking cigarettes. He has a 148.00 pack-year smoking history. He has never used smokeless tobacco. He reports that he does not drink alcohol and does not use drugs.  Allergies  Allergen Reactions  . Penicillins Anaphylaxis  . Bactrim [Sulfamethoxazole-Trimethoprim] Other (See Comments)    ABD CRAMPS AND DIARRHEA  Welps/itch  . Oxycodone-Acetaminophen Other (See Comments)    Other reaction(s): Swelling  . Remicade [Infliximab] Other (See Comments)    COULDN'T BREATHE  . Aspirin     REACTION: unknown reaction  . Codeine     REACTION: unknown reaction  . Ibuprofen     REACTION: unknown reaction  . Macrobid [Nitrofurantoin] Itching  . Tramadol Nausea Only    Other reaction(s): Nausea  . Tramadol Hcl     REACTION: unknown reaction  . Vicodin [Hydrocodone-Acetaminophen] Nausea Only    Family History  Problem Relation Age of Onset  . Diabetes Mother   . Heart disease Mother   . Diabetes  Father   . Heart disease Father   . COPD Maternal Grandmother   . Colon cancer Neg Hx     Prior to Admission medications   Medication Sig Start Date End Date Taking? Authorizing Provider  ACCU-CHEK AVIVA PLUS test strip SMARTSIG:Via Meter 05/02/20   [provider]  albuterol (PROVENTIL) (2.5 MG/3ML) 0.083% nebulizer solution Take 2.5 mg by nebulization every 6 (six) hours as needed for wheezing or shortness of breath.    [provider]  albuterol (VENTOLIN HFA) 108 (90 Base) MCG/ACT inhaler Inhale 2 puffs into the lungs every 6 (six) hours as needed for wheezing or shortness of breath.    [provider]  atorvastatin (LIPITOR) 80 MG tablet Take 80 mg by mouth daily.    [provider]  azelastine (ASTELIN) 0.1 % nasal spray Place 2 sprays into both nostrils 2 (two) times daily. 05/15/20   [provider]  baclofen (LIORESAL) 10 MG tablet Take 10 mg by mouth daily. 10/04/19   [provider]  benztropine (COGENTIN) 0.5 MG tablet Take 0.5 mg by mouth daily.     [provider]  CALCIUM 600/VITAMIN D3 600-800 MG-UNIT TABS TAKE (1) TABLET BY MOUTH THREE TIMES DAILY AFTER MEALS. 06/05/21   Mahala Menghini, PA-C  cefdinir (OMNICEF) 300 MG capsule Take 1 capsule (300 mg total) by mouth 2 (two) times daily. 08/30/22   [place]lin, Berneice Heinrich, PA-C  cyanocobalamin 1000 MCG tablet Take 1,000 mcg by mouth daily.    [provider]  diclofenac Sodium (VOLTAREN) 1 % GEL as directed Externally twice a day for 5 days 04/26/22   [provider]  dicyclomine (BENTYL) 10 MG capsule TAKE 1 CAPSULE BY MOUTH BEFORE MEALS AND AT BEDTIME AS NEEDED FOR ABDOMINAL CRAMPING. *HOLD FOR CONSTIPATION* 06/05/21   Mahala Menghini, PA-C  divalproex (DEPAKOTE) 250 MG DR tablet Take by mouth at bedtime. 11/21/20   [provider]  divalproex (DEPAKOTE) 500 MG DR tablet Take 500 mg by mouth 2 (two) times daily. 02/03/22   [provider]  docusate sodium (COLACE) 100 MG capsule Take 1 capsule (100 mg total) by mouth 2 (two) times daily. 02/14/18   Kathie Dike, MD  ELIQUIS 5 MG TABS tablet Take 5 mg by mouth 2 (two) times daily. 07/14/20   [provider]  ferrous sulfate 325 (65 FE) MG EC tablet Take 1 tablet (325 mg total) by mouth daily with breakfast. 08/23/22   Harriett Rush, PA-C  FLOVENT HFA 110 MCG/ACT inhaler Inhale 2 puffs into the lungs 2 (two) times daily. 01/19/21   [provider]  folic acid (FOLVITE) 1 MG tablet Take 1 tablet by mouth daily.    [provider]  gabapentin (NEURONTIN) 300 MG capsule Take 900 mg by mouth 3 (three) times daily.  [provider]  hydroxychloroquine (PLAQUENIL) 200 MG tablet Take 1 tablet by mouth 2 (two) times daily. 07/06/21   [provider]  meloxicam (MOBIC) 7.5 MG tablet Take 7.5 mg by mouth daily. 11/08/19   [provider]  methocarbamol (ROBAXIN) 500 MG tablet Take 500 mg by mouth 3 (three) times daily. 04/26/22   [provider]  metoprolol succinate (TOPROL-XL) 25 MG 24 hr tablet Take 1 tablet (25 mg total) by mouth in the morning and at bedtime. Take with or immediately following a meal. 12/08/21   Baldwin Jamaica, PA-C  mirtazapine (REMERON) 15 MG tablet Take 15 mg by mouth at bedtime. 06/08/22   [provider]  Misc. Devices (WALKER WHEELS) MISC USE AS DIRECTED 06/06/19   Fields, Marga Melnick, MD  mometasone-formoterol [facility]) 200-5 MCG/ACT AERO Inhale 2 puffs into the lungs in the morning and at bedtime. 01/19/21   Freddi Starr, MD  nitroGLYCERIN (NITROSTAT) 0.4 MG SL tablet Place 1 tablet (0.4 mg total) under the tongue every 5 (five) minutes as needed for chest pain. 01/26/21 07/11/21  Elouise Munroe, MD  Ostomy Supplies (ACTIVE LIFE 1-PC DRAIN [phone]) Pouch MISC  05/21/20   [provider]  oxyCODONE-acetaminophen (PERCOCET) 5-325 MG tablet Take 1 tablet by mouth every 4 (four)  hours as needed for severe pain. 03/26/22 03/26/23  Cleon Gustin, MD  pantoprazole (PROTONIX) 40 MG tablet TAKE (1) TABLET BY MOUTH TWICE A DAY BEFORE MEALS. (BREAKFAST AND SUPPER) 08/01/20   Mahala Menghini, PA-C  Potassium Chloride ER 20 MEQ TBCR Take 1 tablet by mouth daily. Take for 28 days    [provider]  predniSONE (DELTASONE) 20 MG tablet Take by mouth. 01/18/22   [provider]  risperiDONE (RISPERDAL) 3 MG tablet Take 3 mg by mouth 2 (two) times daily. 05/12/21   [provider]  sertraline (ZOLOFT) 50 MG tablet Take 50 mg by mouth daily.    [provider]  tamsulosin (FLOMAX) 0.4 MG CAPS capsule Take 0.4 mg by mouth daily. 07/03/20   [provider]  Tiotropium Bromide Monohydrate (SPIRIVA RESPIMAT) 2.5 MCG/ACT AERS Inhale 2 puffs into the lungs daily. 01/19/21   Freddi Starr, MD  torsemide (DEMADEX) 20 MG tablet Take 1 tablet (20 mg total) by mouth 2 (two) times daily. 07/29/22   Lenna Sciara, NP  traZODone (DESYREL) 100 MG tablet Take 100 mg by mouth at bedtime. 05/12/21   [provider]    Physical Exam: Vitals:   09/06/22 1900 09/06/22 2112 09/06/22 2115 09/06/22 2155  BP: (!) 116/56 106/65 (!) 116/90 103/75  Pulse: 80 72 73 77  Resp: 12 12 10 10   Temp:      TempSrc:      SpO2: 95% 95% 94% 93%  Height:       *** Data Reviewed: {Tip this will not be part of the note when signed- Document your independent interpretation of telemetry tracing, EKG, lab, Radiology test or any other diagnostic tests. Add any new diagnostic test ordered today. (Optional):26781} {Results:26384}  Assessment and Plan: No notes have been filed under this hospital service. Service: Hospitalist     Advance Care Planning:   Code Status: Prior ***  Consults: ***  Family Communication: ***  Severity of Illness: {Observation/Inpatient:21159}  Author: Bernadette Hoit, DO 09/06/2022 11:43 PM  For on call review  [email or web address].

## 2022-09-06 NOTE — ED Notes (Signed)
Colostomy bag changed due to previous bag leaking.

## 2022-09-06 NOTE — ED Provider Notes (Signed)
[facility] EMERGENCY DEPARTMENT Provider Note   CSN: [id number] Arrival date & time: 09/06/22  1149     History  Chief Complaint  Patient presents with   Weakness    [name] is a 56 y.o. male presenting for evaluation of bilateral lower extremity weakness worsening over the past 2 weeks.  He nearly fell going up his front porch today, and his caregiver had to catch him to keep him from falling.  He reports seeing dark stool and blood in his colostomy bag intermittently for the past 4 days also and endorses increasing generalized abdominal pain.  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   He also has a history of DVTs, PE, on Eliquis.  He has noted a intermittent nosebleed over the past several days.   The history is provided by the patient.       Home Medications Prior to Admission medications   Medication Sig Start Date End Date Taking? Authorizing Provider  ACCU-CHEK AVIVA PLUS test strip SMARTSIG:Via Meter 05/02/20   [provider]  albuterol (PROVENTIL) (2.5 MG/3ML) 0.083% nebulizer solution Take 2.5 mg by nebulization every 6 (six) hours as needed for wheezing or shortness of breath.    [provider]  albuterol (VENTOLIN HFA) 108 (90 Base) MCG/ACT inhaler Inhale 2 puffs into the lungs every 6 (six) hours as needed for wheezing or shortness of breath.    [provider]  atorvastatin (LIPITOR) 80 MG tablet Take 80 mg by mouth daily.    [provider]  azelastine (ASTELIN) 0.1 % nasal spray Place 2 sprays into both nostrils 2 (two) times daily. 05/15/20   [provider]  baclofen (LIORESAL) 10 MG tablet Take 10 mg by mouth daily. 10/04/19   [provider]  benztropine (COGENTIN) 0.5 MG tablet Take 0.5 mg by mouth  daily.     [provider]  CALCIUM 600/VITAMIN D3 600-800 MG-UNIT TABS TAKE (1) TABLET BY MOUTH THREE TIMES DAILY AFTER MEALS. 06/05/21   Mahala Menghini, PA-C  cefdinir (OMNICEF) 300 MG capsule Take 1 capsule (300 mg total) by mouth 2 (two) times daily. 08/30/22   Summerlin, Berneice Heinrich, PA-C  cyanocobalamin 1000 MCG tablet Take 1,000 mcg by mouth daily.    [provider]  diclofenac Sodium (VOLTAREN) 1 % GEL as directed Externally twice a day for 5 days 04/26/22   [provider]  dicyclomine (BENTYL) 10 MG capsule TAKE 1 CAPSULE BY MOUTH BEFORE MEALS AND AT BEDTIME AS NEEDED FOR ABDOMINAL CRAMPING. *HOLD FOR CONSTIPATION* 06/05/21   Mahala Menghini, PA-C  divalproex (DEPAKOTE) 250 MG DR tablet Take by mouth at bedtime. 11/21/20   [provider]  divalproex (DEPAKOTE) 500 MG DR tablet Take 500 mg by mouth 2 (two) times daily. 02/03/22   [provider]  docusate sodium (COLACE) 100 MG capsule Take 1 capsule (100 mg total) by mouth 2 (two) times daily. 02/14/18   Kathie Dike, MD  ELIQUIS 5 MG TABS tablet Take 5 mg by mouth 2 (two) times daily. 07/14/20   [provider]  ferrous sulfate 325 (65 FE) MG EC tablet Take 1 tablet (325 mg total) by mouth daily with breakfast. 08/23/22   Harriett Rush, PA-C  FLOVENT HFA 110 MCG/ACT inhaler Inhale 2 puffs into the lungs 2 (two)  times daily. 01/19/21   [provider]  folic acid (FOLVITE) 1 MG tablet Take 1 tablet by mouth daily.    [provider]  gabapentin (NEURONTIN) 300 MG capsule Take 900 mg by mouth 3 (three) times daily.    [provider]  hydroxychloroquine (PLAQUENIL) 200 MG tablet Take 1 tablet by mouth 2 (two) times daily. 07/06/21   [provider]  meloxicam (MOBIC) 7.5 MG tablet Take 7.5 mg by mouth daily. 11/08/19   [provider]  methocarbamol (ROBAXIN) 500 MG tablet Take 500 mg by mouth 3 (three) times daily. 04/26/22   [provider]  metoprolol succinate (TOPROL-XL) 25 MG 24 hr tablet Take 1 tablet (25 mg total) by mouth in the morning and at bedtime. Take with or immediately following a meal. 12/08/21   Baldwin Jamaica, PA-C  mirtazapine (REMERON) 15 MG tablet Take 15 mg by mouth at bedtime. 06/08/22   [provider]  Misc. Devices (WALKER WHEELS) MISC USE AS DIRECTED 06/06/19   Fields, Marga Melnick, MD  mometasone-formoterol [facility]) 200-5 MCG/ACT AERO Inhale 2 puffs into the lungs in the morning and at bedtime. 01/19/21   Freddi Starr, MD  nitroGLYCERIN (NITROSTAT) 0.4 MG SL tablet Place 1 tablet (0.4 mg total) under the tongue every 5 (five) minutes as needed for chest pain. 01/26/21 07/11/21  Elouise Munroe, MD  Ostomy Supplies (ACTIVE LIFE 1-PC DRAIN [phone]) Pouch MISC  05/21/20   [provider]  oxyCODONE-acetaminophen (PERCOCET) 5-325 MG tablet Take 1 tablet by mouth every 4 (four) hours as needed for severe pain. 03/26/22 03/26/23  Cleon Gustin, MD  pantoprazole (PROTONIX) 40 MG tablet TAKE (1) TABLET BY MOUTH TWICE A DAY BEFORE MEALS. (BREAKFAST AND SUPPER) 08/01/20   Mahala Menghini, PA-C  Potassium Chloride ER 20 MEQ TBCR Take 1 tablet by mouth daily. Take for 28 days    [provider]  predniSONE (DELTASONE) 20 MG tablet Take by mouth. 01/18/22   [provider]  risperiDONE (RISPERDAL) 3 MG tablet Take 3 mg by mouth 2 (two) times daily. 05/12/21   [provider]  sertraline (ZOLOFT) 50 MG tablet Take 50 mg by mouth daily.    [provider]  tamsulosin (FLOMAX) 0.4 MG CAPS capsule Take 0.4 mg by mouth daily. 07/03/20   [provider]  Tiotropium Bromide Monohydrate (SPIRIVA RESPIMAT) 2.5 MCG/ACT AERS Inhale 2 puffs into the lungs daily. 01/19/21   Freddi Starr, MD  torsemide (DEMADEX) 20 MG tablet Take 1 tablet (20 mg total) by mouth 2 (two) times daily. 07/29/22   Lenna Sciara, NP  traZODone (DESYREL) 100 MG tablet Take 100 mg  by mouth at bedtime. 05/12/21   [provider]      Allergies    Penicillins, Bactrim [sulfamethoxazole-trimethoprim], Oxycodone-acetaminophen, Remicade [infliximab], Aspirin, Codeine, Ibuprofen, Macrobid [nitrofurantoin], Tramadol, Tramadol hcl, and Vicodin [hydrocodone-acetaminophen]    Review of Systems   Review of Systems  Constitutional:  Positive for fatigue. Negative for fever.  HENT:  Positive for nosebleeds. Negative for congestion and sore throat.   Eyes: Negative.   Respiratory:  Negative for chest tightness and shortness of breath.   Cardiovascular:  Negative for chest pain.  Gastrointestinal:  Positive for abdominal pain and blood in stool. Negative for nausea and vomiting.  Genitourinary: Negative.   Musculoskeletal:  Negative for arthralgias, joint swelling and neck pain.  Skin: Negative.  Negative for rash and wound.  Neurological:  Positive for weakness.  Negative for dizziness, light-headedness, numbness and headaches.  Psychiatric/Behavioral: Negative.      Physical Exam Updated Vital Signs BP (!) 116/56   Pulse 80   Temp 98 F (36.7 C) (Oral)   Resp 12   Ht 5' 6"  (1.676 m)   SpO2 95%   BMI 35.77 kg/m  Physical Exam Vitals and nursing note reviewed.  Constitutional:      Appearance: He is well-developed.  HENT:     Head: Normocephalic and atraumatic.     Mouth/Throat:     Mouth: Mucous membranes are dry.  Eyes:     Conjunctiva/sclera: Conjunctivae normal.  Cardiovascular:     Rate and Rhythm: Normal rate and regular rhythm.     Heart sounds: Normal heart sounds.  Pulmonary:     Effort: Pulmonary effort is normal.     Breath sounds: Normal breath sounds. No wheezing.  Abdominal:     General: Bowel sounds are normal.     Palpations: Abdomen is soft.     Tenderness: There is abdominal tenderness. There is guarding.     Comments: Black appearing stools in colostomy bag.   Musculoskeletal:        General: Normal range of motion.     Cervical  back: Normal range of motion.  Skin:    General: Skin is warm and dry.  Neurological:     Mental Status: He is alert and oriented to person, place, and time.     ED Results / Procedures / Treatments   Labs (all labs ordered are listed, but only abnormal results are displayed) Labs Reviewed  COMPREHENSIVE METABOLIC PANEL - Abnormal; Notable for the following components:      Result Value   Sodium 133 (*)    Glucose, Bld 132 (*)    Creatinine, Ser 1.44 (*)    Calcium 8.1 (*)    Albumin 2.1 (*)    AST 53 (*)    GFR, Estimated 57 (*)    All other components within normal limits  CBC WITH DIFFERENTIAL/PLATELET - Abnormal; Notable for the following components:   WBC 2.6 (*)    RBC 3.23 (*)    Hemoglobin 10.4 (*)    HCT 32.6 (*)    MCV 100.9 (*)    RDW 15.6 (*)    Platelets 47 (*)    Neutro Abs 1.3 (*)    All other components within normal limits  CBG MONITORING, ED - Abnormal; Notable for the following components:   Glucose-Capillary 104 (*)    All other components within normal limits  POC OCCULT BLOOD, ED - Abnormal; Notable for the following components:   Fecal Occult Bld POSITIVE (*)    All other components within normal limits  RESP PANEL BY RT-PCR (RSV, FLU A&B, COVID)  RVPGX2  URINALYSIS, ROUTINE W REFLEX MICROSCOPIC  SEDIMENTATION RATE  C-REACTIVE PROTEIN  TROPONIN I (HIGH SENSITIVITY)    EKG None  Radiology No results found.  Procedures Procedures    Medications Ordered in ED Medications  iohexol (OMNIPAQUE) 300 MG/ML solution 100 mL (100 mLs Intravenous Contrast Given 09/06/22 1905)    ED Course/ Medical Decision Making/ A&P                           Medical Decision Making Patient presenting with increasing weakness, near fall prior to arrival, pancytopenia, black stools in his colostomy positive for blood, nosebleed but not currently active.  Patient is most concerning for symptomatic  thrombocytopenia, his platelet count is 47 currently.  He also  has significant abdominal pain on exam concerning for probable Crohn's flare.  CT abdomen pelvis is currently pending.  I suspect this patient will need admission pending the results of this CT scan.  Patient signed out to [employer], [employer].  Amount and/or Complexity of Data Reviewed Labs: ordered. Radiology: ordered.           Final Clinical Impression(s) / ED Diagnoses Final diagnoses:  Generalized abdominal pain  Gastrointestinal hemorrhage with melena  Thrombocytopenia [facility])    Rx / DC Orders ED Discharge Orders     None         Landis Martins 09/06/22 1914    Teressa Lower, MD 09/07/22 [phone]

## 2022-09-06 NOTE — ED Provider Notes (Signed)
Physical Exam  BP 103/75   Pulse 77   Temp 98 F (36.7 C) (Oral)   Resp 10   Ht 5' 6"  (1.676 m)   SpO2 93%   BMI 35.77 kg/m   Physical Exam Vitals and nursing note reviewed.  Constitutional:      General: He is not in acute distress.    Appearance: He is well-developed.  HENT:     Head: Normocephalic and atraumatic.     Nose:     Comments: Dried blood noted at end of nare. Eyes:     Conjunctiva/sclera: Conjunctivae normal.  Cardiovascular:     Rate and Rhythm: Normal rate and regular rhythm.     Heart sounds: No murmur heard. Pulmonary:     Effort: Pulmonary effort is normal. No respiratory distress.     Breath sounds: Normal breath sounds.  Abdominal:     Palpations: Abdomen is soft.     Tenderness: There is abdominal tenderness.     Comments: Tenderness around stoma site.  No obvious discharge noted.  Melena and colostomy bag.  Musculoskeletal:        General: No swelling.     Cervical back: Neck supple. No rigidity or tenderness.     Right lower leg: No edema.     Left lower leg: No edema.     Comments: Patient with bilateral lower extremities at hips, knees, ankles.  Strength symmetric bilaterally.  Pedal pulses full and intact bilaterally.  Skin:    General: Skin is warm and dry.     Capillary Refill: Capillary refill takes less than 2 seconds.  Neurological:     Mental Status: He is alert.  Psychiatric:        Mood and Affect: Mood normal.     Procedures  Procedures  ED Course / MDM   Clinical Course as of 09/06/22 2247  Mon Sep 06, 2022  2211 Consult to gastroenterology Dr. Abbey Chatters regarding the patient.  He agreed with admission of the patient, beginning of IV Protonix as well as seeing the patient in the hospital tomorrow. [CR]  2243 Consulted hospital medicine Dr. Hanley Ben regarding the patient.  He agreed with admission the patient is seen further treatment/care. [CR]    Clinical Course User Index [CR] Wilnette Kales, PA   Medical Decision  Making Amount and/or Complexity of Data Reviewed Labs: ordered. Radiology: ordered.  Risk Prescription drug management. Decision regarding hospitalization.    Patient care handed off from Evalee Jefferson, PA-C at shift change.  In short, patient with 2-week history of bilateral lower extremity weakness which has resulted in feelings of going to fall without any overt fall.  Patient also reports abdominal pain around stomal site which is worsened over the past 4 to 5 days with noticeable black stool.  Patient on Eliquis for recurrent DVT/pulmonary embolism.  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  Patient reports persistent melena since onset as well as intermittent nosebleeds that have occurred.  Denies any chest pain, shortness of breath, bladder dysfunction, saddle anesthesia, fever, sensory deficits lower extremities, nausea/vomiting.  Patient states that he walks with a walker at baseline he feels like he is unable to care for himself at home any longer as he lives by himself.  Past medical history significant for hyperlipidemia, Crohn's colitis, bipolar disorder, DVT/PE, diabetes mellitus type 2, SBO  Laboratory  studies: Mild hyponatremia with sodium 133, otherwise electrolytes within normal range.  Slight decrease in renal function with a creatinine of 1.44, BUN 19 and GFR 57 which is slightly worse than baseline.  No obvious transaminitis noted.  Pancytopenia with leukopenia of 2.6, anemia of hemoglobin of 10.4, thrombocytopenia with platelets of 47.  Initial troponin 3.  Respiratory viral panel negative.  Sed rate of 10.  CRP pending. Imaging studies: CT abdomen pelvis: Status post colectomy with right lower quadrant ostomy.  Mild fat stranding and small lymph nodes in the intra-abdominal wall at level of ostomy.  No bowel  obstruction for free air.  Distended bladder.  Stable gastrohepatic varices. Medications: Patient given IV Protonix for presence of GI bleed as well as ciprofloxacin for evidence of infection of stomal site via physical exam and CT imaging findings.  Thrombocytopenia/melena/abdominal pain around stoma site Vital signs within normal range and stable throughout visit. Laboratory/imaging studies significant for: See above Patient deemed to meet admission criteria given evidence of pancytopenia which is chronic in nature but with slight acutely worsening platelets of now 47 as well as current GI bleed with melena appreciated as well as intermittent epistaxis.  Consultations were made with both gastroenterology and hospital medicine regarding the patient as depicted in ED course.  Treatment plan discussed length with patient he acknowledged understanding was agreeable to said plan.  Proper consultations were made while in the emergency department.  Patient stable upon admission to the hospital.   Wilnette Kales, PA 09/06/22 2247    Teressa Lower, MD 09/07/22 1309

## 2022-09-06 NOTE — H&P (Addendum)
History and Physical    Patient: [name] DOB: 12-29-1965 DOA: 09/06/2022 DOS: the patient was seen and examined on 09/07/2022 PCP: [name], MD  Patient coming from: ALF/ILF  Chief Complaint:  Chief Complaint  Patient presents with   Weakness   HPI: [name] is a 56 y.o. 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  Patient reports 4 day onset of intermittent black stool in his colostomy bag and increased abdominal pain.  New complaint of generalized weakness with weakness in his legs and notes that he almost fell today.  ED Course:  In the emergency department, he was hemodynamically stable with normal vital signs.  Workup in the ED showed pancytopenia with MCV of 100.9.  BMP showed sodium 133, potassium 4.0, chloride 100, bicarb 26, glucose 132, BUN 19, creatinine 1.44 (baseline creatinine at 0.7-1.1), albumin 2.1, AST 53, ALT 22, ALP 1 2.  FOBT was positive, urinalysis was normal CT abdomen pelvis with contrast showed: 1. Status post colectomy with right lower quadrant ostomy. Mild fat stranding and small lymph nodes in the anterior abdominal wall at the level of the ostomy. Correlate clinically for infection. No focal fluid collection. 2. No bowel obstruction or free air. 3. Distended bladder. 4. Stable gastrohepatic varices. Patient was treated with IV Protonix, gastroenterology was consulted and will follow-up with patient in the morning per ED PA.  Review of Systems: Review of systems as noted in the HPI. All other systems reviewed and are negative.   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[place])    right, s/p replacement, due to prednisone   Bipolar  disorder ([place])    Colostomy in place [facility])    Crohn's colitis ([place])    s/p total colectomy with ileostomy in 2009   Crohn's disease of small intestine [facility]) Sept 2012   ileoscopy: multiple ulcers likely secondary to [place], HX OF 02/11/2009   Qualifier: Diagnosis of  By: Zeb Comfort     Depression    DVT (deep venous thrombosis) ([place]) 2009   right upper extremity due to PICC   Enterococcus UTI 2009   GERD (gastroesophageal reflux disease)    Hernia    Hyperlipidemia    IDA (iron deficiency anemia) 06/17/2022   Insomnia    Migraine headache    Nystagmus    PULMONARY EMBOLISM, HX OF 02/11/2009   Qualifier: Diagnosis of  By: Zeb Comfort     S/P endoscopy Sept 2012   mild gastritis, small hiatal hernia, no ulcers   Schizophrenia ([place])    Schizophrenia, acute ([place])    Past Surgical History:  Procedure Laterality Date   APPENDECTOMY     CYSTOSCOPY WITH DIRECT VISION INTERNAL URETHROTOMY N/A 03/25/2022   Procedure: CYSTOSCOPY WITH DIRECT VISION INTERNAL URETHROTOMY;  Surgeon: Cleon Gustin, MD;  Location: [facility];  Service: Urology;  Laterality: N/A;   ESOPHAGOGASTRODUODENOSCOPY  07/2010   gastritis,  bx neg for H.Pylori   ESOPHAGOGASTRODUODENOSCOPY N/A 01/08/2014   SLF:NO SOURCE FOR ODYNOPHAGIA IDENTIFIED/Multiple small ulcers in the gastric antrum   EYE SURGERY     ILEOSCOPY  06/29/2011   SLF: ulcers,multiple/small HH/mild gastritis   KIDNEY SURGERY  LAPAROTOMY N/A 06/05/2013   Procedure: EXPLORATORY LAPAROTOMY;  Surgeon: Donato Heinz, MD;  Location: [facility];  Service: General;  Laterality: N/A;   LYSIS OF ADHESION N/A 06/05/2013   Procedure: LYSIS OF ADHESIONS;  Surgeon: Donato Heinz, MD;  Location: [facility];  Service: General;  Laterality: N/A;   small bowel capsule study  08/2010   few tiny nonbleeding erosions/ulcers ?secondary to relafen or Crohn's. Entire small bowel not seen.    TOTAL COLECTOMY  2009   with ileostomy at [facility] for  refractory disease    TOTAL HIP ARTHROPLASTY     right, due to avascular necrosis from chronic prednisone   TOTAL SHOULDER REPLACEMENT     bilateral   TRANSURETHRAL RESECTION OF PROSTATE N/A 12/03/2019   Procedure: TRANSURETHRAL RESECTION OF THE PROSTATE (TURP);  Surgeon: Cleon Gustin, MD;  Location: [facility];  Service: Urology;  Laterality: N/A;    Social History:  reports that he has been smoking cigarettes. He has a 148.00 pack-year smoking history. He has never used smokeless tobacco. He reports that he does not drink alcohol and does not use drugs.   Allergies  Allergen Reactions   Penicillins Anaphylaxis   Bactrim [Sulfamethoxazole-Trimethoprim] Other (See Comments)    ABD CRAMPS AND DIARRHEA Welps/itch   Oxycodone-Acetaminophen Other (See Comments)    Other reaction(s): Swelling   Remicade [Infliximab] Other (See Comments)    COULDN'T BREATHE   Aspirin     REACTION: unknown reaction   Codeine     REACTION: unknown reaction   Ibuprofen     REACTION: unknown reaction   Macrobid [Nitrofurantoin] Itching   Tramadol Nausea Only    Other reaction(s): Nausea   Tramadol Hcl     REACTION: unknown reaction   Vicodin [Hydrocodone-Acetaminophen] Nausea Only    Family History  Problem Relation Age of Onset   Diabetes Mother    Heart disease Mother    Diabetes Father    Heart disease Father    COPD Maternal Grandmother    Colon cancer Neg Hx      Prior to Admission medications   Medication Sig Start Date End Date Taking? Authorizing Provider  ACCU-CHEK AVIVA PLUS test strip SMARTSIG:Via Meter 05/02/20   [provider]  albuterol (PROVENTIL) (2.5 MG/3ML) 0.083% nebulizer solution Take 2.5 mg by nebulization every 6 (six) hours as needed for wheezing or shortness of breath.    [provider]  albuterol (VENTOLIN HFA) 108 (90 Base) MCG/ACT inhaler Inhale 2 puffs into the lungs every 6 (six) hours as needed for wheezing or shortness of breath.    [provider]  atorvastatin (LIPITOR) 80 MG tablet Take 80 mg by mouth daily.    [provider]  azelastine (ASTELIN) 0.1 % nasal spray Place 2 sprays into both nostrils 2 (two) times daily. 05/15/20   [provider]  baclofen (LIORESAL) 10 MG tablet Take 10 mg by mouth daily. 10/04/19   [provider]  benztropine (COGENTIN) 0.5 MG tablet Take 0.5 mg by mouth daily.     [provider]  CALCIUM 600/VITAMIN D3 600-800 MG-UNIT TABS TAKE (1) TABLET BY MOUTH THREE TIMES DAILY AFTER MEALS. 06/05/21   Mahala Menghini, PA-C  cefdinir (OMNICEF) 300 MG capsule Take 1 capsule (300 mg total) by mouth 2 (two) times daily. 08/30/22   Summerlin, Berneice Heinrich, PA-C  cyanocobalamin 1000 MCG tablet Take 1,000 mcg by mouth daily.    [provider]  diclofenac Sodium (VOLTAREN) 1 %  GEL as directed Externally twice a day for 5 days 04/26/22   [provider]  dicyclomine (BENTYL) 10 MG capsule TAKE 1 CAPSULE BY MOUTH BEFORE MEALS AND AT BEDTIME AS NEEDED FOR ABDOMINAL CRAMPING. *HOLD FOR CONSTIPATION* 06/05/21   Mahala Menghini, PA-C  divalproex (DEPAKOTE) 250 MG DR tablet Take by mouth at bedtime. 11/21/20   [provider]  divalproex (DEPAKOTE) 500 MG DR tablet Take 500 mg by mouth 2 (two) times daily. 02/03/22   [provider]  docusate sodium (COLACE) 100 MG capsule Take 1 capsule (100 mg total) by mouth 2 (two) times daily. 02/14/18   Kathie Dike, MD  ELIQUIS 5 MG TABS tablet Take 5 mg by mouth 2 (two) times daily. 07/14/20   [provider]  ferrous sulfate 325 (65 FE) MG EC tablet Take 1 tablet (325 mg total) by mouth daily with breakfast. 08/23/22   Harriett Rush, PA-C  FLOVENT HFA 110 MCG/ACT inhaler Inhale 2 puffs into the lungs 2 (two) times daily. 01/19/21   [provider]  folic acid (FOLVITE) 1 MG tablet Take 1 tablet by mouth daily.    [provider]  gabapentin (NEURONTIN) 300 MG capsule  Take 900 mg by mouth 3 (three) times daily.    [provider]  hydroxychloroquine (PLAQUENIL) 200 MG tablet Take 1 tablet by mouth 2 (two) times daily. 07/06/21   [provider]  meloxicam (MOBIC) 7.5 MG tablet Take 7.5 mg by mouth daily. 11/08/19   [provider]  methocarbamol (ROBAXIN) 500 MG tablet Take 500 mg by mouth 3 (three) times daily. 04/26/22   [provider]  metoprolol succinate (TOPROL-XL) 25 MG 24 hr tablet Take 1 tablet (25 mg total) by mouth in the morning and at bedtime. Take with or immediately following a meal. 12/08/21   Baldwin Jamaica, PA-C  mirtazapine (REMERON) 15 MG tablet Take 15 mg by mouth at bedtime. 06/08/22   [provider]  Misc. Devices (WALKER WHEELS) MISC USE AS DIRECTED 06/06/19   Fields, Marga Melnick, MD  mometasone-formoterol [facility]) 200-5 MCG/ACT AERO Inhale 2 puffs into the lungs in the morning and at bedtime. 01/19/21   Freddi Starr, MD  nitroGLYCERIN (NITROSTAT) 0.4 MG SL tablet Place 1 tablet (0.4 mg total) under the tongue every 5 (five) minutes as needed for chest pain. 01/26/21 07/11/21  Elouise Munroe, MD  Ostomy Supplies (ACTIVE LIFE 1-PC DRAIN [phone]) Pouch MISC  05/21/20   [provider]  oxyCODONE-acetaminophen (PERCOCET) 5-325 MG tablet Take 1 tablet by mouth every 4 (four) hours as needed for severe pain. 03/26/22 03/26/23  Cleon Gustin, MD  pantoprazole (PROTONIX) 40 MG tablet TAKE (1) TABLET BY MOUTH TWICE A DAY BEFORE MEALS. (BREAKFAST AND SUPPER) 08/01/20   Mahala Menghini, PA-C  Potassium Chloride ER 20 MEQ TBCR Take 1 tablet by mouth daily. Take for 28 days    [provider]  predniSONE (DELTASONE) 20 MG tablet Take by mouth. 01/18/22   [provider]  risperiDONE (RISPERDAL) 3 MG tablet Take 3 mg by mouth 2 (two) times daily. 05/12/21   [provider]  sertraline (ZOLOFT) 50 MG tablet Take 50 mg by mouth daily.    [provider]  tamsulosin  (FLOMAX) 0.4 MG CAPS capsule Take 0.4 mg by mouth daily. 07/03/20   [provider]  Tiotropium Bromide Monohydrate (SPIRIVA RESPIMAT) 2.5 MCG/ACT AERS Inhale 2 puffs into the lungs daily. 01/19/21   Freda Jackson  B, MD  torsemide (DEMADEX) 20 MG tablet Take 1 tablet (20 mg total) by mouth 2 (two) times daily. 07/29/22   Lenna Sciara, NP  traZODone (DESYREL) 100 MG tablet Take 100 mg by mouth at bedtime. 05/12/21   [provider]    Physical Exam: BP 103/75   Pulse 77   Temp 98 F (36.7 C) (Oral)   Resp 10   Ht 5' 6"  (1.676 m)   SpO2 93%   BMI 35.77 kg/m   General: 56 y.o. year-old male well developed well nourished in no acute distress.  Alert and oriented x3. HEENT: NCAT, EOMI, dry mucous membrane Neck: Supple, trachea medial Cardiovascular: Regular rate and rhythm with no rubs or gallops.  No thyromegaly or JVD noted.  +2 lower extremity edema. 2/4 pulses in all 4 extremities. Respiratory: Clear to auscultation with no wheezes or rales. Good inspiratory effort. Abdomen: Soft, tender to palpation with guarding.  Black stools noted in colostomy bag.   Muskuloskeletal: No cyanosis or clubbing  noted bilaterally Neuro: CN II-XII intact, strength 5/5 x 4, sensation, reflexes intact Skin: Bilateral chronic venous stasis.   Psychiatry: Judgement and insight appear normal. Mood is appropriate for condition and setting          Labs on Admission:  Basic Metabolic Panel: Recent Labs  Lab 09/06/22 1554  NA 133*  K 4.1  CL 100  CO2 26  GLUCOSE 132*  BUN 19  CREATININE 1.44*  CALCIUM 8.1*   Liver Function Tests: Recent Labs  Lab 09/06/22 1554  AST 53*  ALT 22  ALKPHOS 112  BILITOT 0.8  PROT 7.1  ALBUMIN 2.1*   No results for input(s): "LIPASE", "AMYLASE" in the last 168 hours. No results for input(s): "AMMONIA" in the last 168 hours. CBC: Recent Labs  Lab 09/06/22 1554  WBC 2.6*  NEUTROABS 1.3*  HGB 10.4*  HCT 32.6*  MCV 100.9*  PLT 47*    Cardiac Enzymes: No results for input(s): "CKTOTAL", "CKMB", "CKMBINDEX", "TROPONINI" in the last 168 hours.  BNP (last 3 results) No results for input(s): "BNP" in the last 8760 hours.  ProBNP (last 3 results) No results for input(s): "PROBNP" in the last 8760 hours.  CBG: Recent Labs  Lab 09/06/22 1224  GLUCAP 104*    Radiological Exams on Admission: CT ABDOMEN PELVIS W CONTRAST  Result Date: 09/06/2022 CLINICAL DATA:  Acute abdominal pain.  Bowel resection 2 years ago. EXAM: CT ABDOMEN AND PELVIS WITH CONTRAST TECHNIQUE: Multidetector CT imaging of the abdomen and pelvis was performed using the standard protocol following bolus administration of intravenous contrast. RADIATION DOSE REDUCTION: This exam was performed according to the departmental dose-optimization program which includes automated exposure control, adjustment of the mA and/or kV according to patient size and/or use of iterative reconstruction technique. CONTRAST:  158m OMNIPAQUE IOHEXOL 300 MG/ML  SOLN COMPARISON:  CT abdomen 11/11/2021 FINDINGS: Lower chest: No acute abnormality. Hepatobiliary: No focal liver abnormality is seen. No gallstones, gallbladder wall thickening, or biliary dilatation. Pancreas: Unremarkable. No pancreatic ductal dilatation or surrounding inflammatory changes. Spleen: Borderline enlarged, unchanged. Adrenals/Urinary Tract: Bladder is distended. There is no hydronephrosis or perinephric fat stranding. There is left renal cortical scarring. Adrenal glands are within normal limits. Stomach/Bowel: Patient is status post colectomy. Right lower quadrant ostomy is present. Mild fat stranding and small lymph nodes are seen in the anterior abdominal wall at the level of the ostomy. No focal fluid collection. No bowel obstruction, pneumatosis or free air. The stomach  is within normal limits. Vascular/Lymphatic: Aorta and IVC are normal in size. 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  No enlarged lymph nodes are seen. Reproductive: Prostate gland is small or absent. Other: No ascites. No focal abdominal wall hernia. Midline abdominal scar is present. Musculoskeletal: There is a right hip arthroplasty in anatomic alignment. IMPRESSION: 1. Status post colectomy with right lower quadrant ostomy. Mild fat stranding and small lymph nodes in the anterior abdominal wall at the level of the ostomy. Correlate clinically for infection. No focal fluid collection. 2. No bowel obstruction or free air. 3. Distended bladder. 4. Stable gastrohepatic varices. Electronically Signed   By: Ronney Asters M.D.   On: 09/06/2022 19:30    EKG: I independently viewed the EKG done and my findings are as followed: EKG was not done in the ED  Assessment/Plan Present on Admission:  GI bleed  Iron deficiency anemia  Principal Problem:   GI bleed Active Problems:   DM type 2 (diabetes mellitus, type 2) (HCC)   Iron deficiency anemia   Pancytopenia (HCC)   AKI (acute kidney injury) (HCC)   Hypoalbuminemia due to protein-calorie malnutrition (HCC)   History of DVT (deep vein thrombosis)   History of pulmonary embolism  GI bleed H/H= 10.4/32.6, this was 11.6/35.9 on 06/10/2022 Hemoccult was positive Black stool noted in colostomy bag, It should be noted the patient takes ferrous sulfate for iron deficiency anemia which could also cause black stool Continue IV Protonix 40 mg twice daily Gastroenterologist was consulted by ED PA will follow-up with patient in the morning  Chronic pancytopenia Iron deficiency anemia Patient follows with cancer center at [facility] when he gets an iron infusion sometimes He takes ferrous sulfate  Acute kidney injury Creatinine 1.44 (baseline creatinine at 0.7-1.1) Renally adjust medications, avoid nephrotoxic agents/dehydration/hypotension  Hypoalbuminemia possibly secondary to moderate protein calorie malnutrition Albumin 2.1, protein supplements were provided  Class II  obesity (BMI 35.07) Diet and lifestyle modification  Type 2 diabetes mellitus Continue ISS and hypoglycemia protocol  History of DVTs, PE Eliquis to be held at this time   DVT prophylaxis: SCDs  Code Status: Full code  Family Communication: None at bedside  Consults: Gastroenterology  Severity of Illness: The appropriate patient status for this patient is OBSERVATION. Observation status is judged to be reasonable and necessary in order to provide the required intensity of service to ensure the patient's safety. The patient's presenting symptoms, physical exam findings, and initial radiographic and laboratory data in the context of their medical condition is felt to place them at decreased risk for further clinical deterioration. Furthermore, it is anticipated that the patient will be medically stable for discharge from the hospital within 2 midnights of admission.   Author: Bernadette Hoit, DO 09/07/2022 12:50 AM  For on call review [email or web address].

## 2022-09-06 NOTE — ED Triage Notes (Signed)
Pt with c/o weakness to bilateral legs x 2 weeks. Per EMS pt's CBG was 185.

## 2022-09-06 NOTE — ED Notes (Addendum)
Occult stool positive--PA-C made aware

## 2022-09-07 ENCOUNTER — Observation Stay (HOSPITAL_COMMUNITY): Payer: Medicaid Other | Admitting: Anesthesiology

## 2022-09-07 ENCOUNTER — Encounter (HOSPITAL_COMMUNITY): Payer: Self-pay | Admitting: Internal Medicine

## 2022-09-07 ENCOUNTER — Encounter (HOSPITAL_COMMUNITY)
Admission: EM | Disposition: A | Discharge status: Nursing Home (Includes ICF) | Payer: Self-pay | Source: Home / Self Care | Attending: Family Medicine

## 2022-09-07 DIAGNOSIS — K922 Gastrointestinal hemorrhage, unspecified: Secondary | ICD-10-CM | POA: Diagnosis not present

## 2022-09-07 DIAGNOSIS — K449 Diaphragmatic hernia without obstruction or gangrene: Secondary | ICD-10-CM | POA: Diagnosis not present

## 2022-09-07 DIAGNOSIS — K921 Melena: Secondary | ICD-10-CM

## 2022-09-07 DIAGNOSIS — K31811 Angiodysplasia of stomach and duodenum with bleeding: Secondary | ICD-10-CM

## 2022-09-07 DIAGNOSIS — N179 Acute kidney failure, unspecified: Secondary | ICD-10-CM | POA: Insufficient documentation

## 2022-09-07 DIAGNOSIS — K3189 Other diseases of stomach and duodenum: Secondary | ICD-10-CM | POA: Diagnosis not present

## 2022-09-07 DIAGNOSIS — E46 Unspecified protein-calorie malnutrition: Secondary | ICD-10-CM | POA: Insufficient documentation

## 2022-09-07 DIAGNOSIS — Z86711 Personal history of pulmonary embolism: Secondary | ICD-10-CM | POA: Insufficient documentation

## 2022-09-07 DIAGNOSIS — E119 Type 2 diabetes mellitus without complications: Secondary | ICD-10-CM

## 2022-09-07 DIAGNOSIS — Z86718 Personal history of other venous thrombosis and embolism: Secondary | ICD-10-CM

## 2022-09-07 DIAGNOSIS — D61818 Other pancytopenia: Secondary | ICD-10-CM | POA: Insufficient documentation

## 2022-09-07 HISTORY — PX: ESOPHAGOGASTRODUODENOSCOPY (EGD) WITH PROPOFOL: SHX5813

## 2022-09-07 HISTORY — PX: HOT HEMOSTASIS: SHX5433

## 2022-09-07 HISTORY — DX: Acute kidney failure, unspecified: N17.9

## 2022-09-07 LAB — COMPREHENSIVE METABOLIC PANEL
ALT: 19 U/L (ref 0–44)
AST: 47 U/L — ABNORMAL HIGH (ref 15–41)
Albumin: 1.9 g/dL — ABNORMAL LOW (ref 3.5–5.0)
Alkaline Phosphatase: 98 U/L (ref 38–126)
Anion gap: 3 — ABNORMAL LOW (ref 5–15)
BUN: 15 mg/dL (ref 6–20)
CO2: 30 mmol/L (ref 22–32)
Calcium: 8.6 mg/dL — ABNORMAL LOW (ref 8.9–10.3)
Chloride: 104 mmol/L (ref 98–111)
Creatinine, Ser: 1.2 mg/dL (ref 0.61–1.24)
GFR, Estimated: >60 mL/min (ref >60)
Glucose, Bld: 73 mg/dL (ref 70–99)
Potassium: 3.8 mmol/L (ref 3.5–5.1)
Sodium: 137 mmol/L (ref 135–145)
Total Bilirubin: 0.8 mg/dL (ref 0.3–1.2)
Total Protein: 6.6 g/dL (ref 6.5–8.1)

## 2022-09-07 LAB — GLUCOSE, CAPILLARY
Glucose-Capillary: 76 mg/dL (ref 70–99)
Glucose-Capillary: 71 mg/dL (ref 70–99)
Glucose-Capillary: 87 mg/dL (ref 70–99)

## 2022-09-07 LAB — CBG MONITORING, ED
Glucose-Capillary: 132 mg/dL — ABNORMAL HIGH (ref 70–99)
Glucose-Capillary: 102 mg/dL — ABNORMAL HIGH (ref 70–99)
Glucose-Capillary: 74 mg/dL (ref 70–99)

## 2022-09-07 LAB — CBC
HCT: 29.4 % — ABNORMAL LOW (ref 39.0–52.0)
Hemoglobin: 9.6 g/dL — ABNORMAL LOW (ref 13.0–17.0)
MCH: 32.8 pg (ref 26.0–34.0)
MCHC: 32.7 g/dL (ref 30.0–36.0)
MCV: 100.3 fL — ABNORMAL HIGH (ref 80.0–100.0)
Platelets: 65 10*3/uL — ABNORMAL LOW (ref 150–400)
RBC: 2.93 MIL/uL — ABNORMAL LOW (ref 4.22–5.81)
RDW: 15.7 % — ABNORMAL HIGH (ref 11.5–15.5)
WBC: 2.9 10*3/uL — ABNORMAL LOW (ref 4.0–10.5)
nRBC: 0 % (ref 0.0–0.2)

## 2022-09-07 LAB — MAGNESIUM: Magnesium: 2.1 mg/dL (ref 1.7–2.4)

## 2022-09-07 LAB — HIV ANTIBODY (ROUTINE TESTING W REFLEX): HIV Screen 4th Generation wRfx: NONREACTIVE

## 2022-09-07 LAB — C-REACTIVE PROTEIN: CRP: 1.3 mg/dL — ABNORMAL HIGH (ref <1.0)

## 2022-09-07 LAB — PHOSPHORUS: Phosphorus: 3.8 mg/dL (ref 2.5–4.6)

## 2022-09-07 SURGERY — ESOPHAGOGASTRODUODENOSCOPY (EGD) WITH PROPOFOL
Anesthesia: General

## 2022-09-07 MED ORDER — GLUCERNA SHAKE PO LIQD
237.0000 mL | Freq: Three times a day (TID) | ORAL | Status: DC
  Administered 2022-09-07 – 2022-09-09 (×5): 237 mL via ORAL
  Filled 2022-09-07 (×8): qty 237

## 2022-09-07 MED ORDER — LIDOCAINE HCL (CARDIAC) PF 100 MG/5ML IV SOSY
PREFILLED_SYRINGE | INTRAVENOUS | Status: DC | PRN
  Administered 2022-09-07: 50 mg via INTRAVENOUS

## 2022-09-07 MED ORDER — LACTATED RINGERS IV SOLN: INTRAVENOUS | Status: DC | PRN

## 2022-09-07 MED ORDER — PHENYLEPHRINE HCL (PRESSORS) 10 MG/ML IV SOLN
INTRAVENOUS | Status: DC | PRN
  Administered 2022-09-07 (×2): 80 ug via INTRAVENOUS

## 2022-09-07 MED ORDER — LACTATED RINGERS IV SOLN: INTRAVENOUS | Status: DC

## 2022-09-07 MED ORDER — INFLUENZA VAC SPLIT QUAD 0.5 ML IM SUSY
0.5000 mL | PREFILLED_SYRINGE | INTRAMUSCULAR | Status: AC
  Administered 2022-09-08: 0.5 mL via INTRAMUSCULAR
  Filled 2022-09-07: qty 0.5

## 2022-09-07 MED ORDER — OCTREOTIDE LOAD VIA INFUSION
50.0000 ug | Freq: Once | INTRAVENOUS | Status: DC
  Filled 2022-09-07: qty 25

## 2022-09-07 MED ORDER — CIPROFLOXACIN IN D5W 400 MG/200ML IV SOLN
400.0000 mg | Freq: Two times a day (BID) | INTRAVENOUS | Status: DC
  Administered 2022-09-07 – 2022-09-08 (×3): 400 mg via INTRAVENOUS
  Filled 2022-09-07 (×3): qty 200

## 2022-09-07 MED ORDER — PROPOFOL 10 MG/ML IV BOLUS
INTRAVENOUS | Status: DC | PRN
  Administered 2022-09-07: 30 mg via INTRAVENOUS
  Administered 2022-09-07: 20 mg via INTRAVENOUS
  Administered 2022-09-07: 30 mg via INTRAVENOUS
  Administered 2022-09-07: 120 mg via INTRAVENOUS

## 2022-09-07 MED ORDER — SODIUM CHLORIDE 0.9 % IV SOLN: INTRAVENOUS | Status: DC

## 2022-09-07 MED ORDER — PANTOPRAZOLE SODIUM 40 MG IV SOLR
40.0000 mg | Freq: Two times a day (BID) | INTRAVENOUS | Status: DC
  Administered 2022-09-07 – 2022-09-09 (×5): 40 mg via INTRAVENOUS
  Filled 2022-09-07 (×5): qty 10

## 2022-09-07 MED ORDER — SODIUM CHLORIDE 0.9 % IV SOLN
50.0000 ug/h | INTRAVENOUS | Status: DC
  Filled 2022-09-07 (×3): qty 1

## 2022-09-07 MED ORDER — INSULIN ASPART 100 UNIT/ML IJ SOLN
0.0000 [IU] | INTRAMUSCULAR | Status: DC
  Administered 2022-09-07: 2 [IU] via SUBCUTANEOUS
  Administered 2022-09-08: 3 [IU] via SUBCUTANEOUS
  Administered 2022-09-09 (×2): 2 [IU] via SUBCUTANEOUS
  Filled 2022-09-07: qty 1

## 2022-09-07 NOTE — Plan of Care (Signed)
[name] admitted to [address]-- understanding of diabetes and states he does not have the money to follow his diabetes diet so he eats what is available to him. He has previously run out of money to buy food. Understanding he is here because he got dizzy and fell-- did also notice dark, bloody stool out of his colostomy so after he fell they called 911. Understands he is a high fall risk while here and that he will call prior to getting out of bed. PT consult would be beneficial to evaluate for home health PT, TOC consult placed.

## 2022-09-07 NOTE — Progress Notes (Signed)
PROGRESS NOTE    [name]  TLX:[id number] DOB: [birth year] DOA: 09/06/2022 PCP: [name], MD   Brief Narrative:    [name] is a 55 y.o. 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  Patient reports 4 day onset of intermittent black stool in his colostomy bag and increased abdominal pain.  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  Plan is to continue clear liquid diet and close monitoring of hemoglobin and hematocrit for now with PPI twice daily.  GI following.  Assessment & Plan:   Principal Problem:   GI bleed Active Problems:   DM type 2 (diabetes mellitus, type 2) (HCC)   Iron deficiency anemia   Pancytopenia (HCC)   AKI (acute kidney injury) (HCC)   Hypoalbuminemia due to protein-calorie malnutrition (HCC)   History of DVT (deep vein thrombosis)   History of pulmonary embolism  Assessment and Plan:   Acute blood loss anemia secondary to GI bleed Status post EGD 12/5 with APC Continue monitor a.m. CBC Hemoccult was positive Clear liquid diet Continue IV Protonix 40 mg twice daily Resume home Eliquis once appropriate per GI   Chronic pancytopenia Iron deficiency anemia Patient follows with [facility] when he gets an iron infusion sometimes He takes ferrous sulfate   Acute kidney injury-improving Creatinine 1.44 (baseline creatinine at 0.7-1.1) Renally adjust medications, avoid nephrotoxic agents/dehydration/hypotension Continue to monitor repeat labs   Hypoalbuminemia possibly secondary to moderate protein calorie malnutrition Albumin 2.1,  protein supplements were provided   Class II obesity (BMI 35.07) Diet and lifestyle modification   Type 2 diabetes mellitus-controlled Continue ISS and hypoglycemia protocol   History of DVTs, PE Eliquis to be held at this time until appropriate to resume per GI    DVT prophylaxis: SCDs Code Status: Full Family Communication: None at bedside Disposition Plan: Return home with home health PT once stable Status is: Observation The patient will require care spanning > 2 midnights and should be moved to inpatient because: IV medication.  Consultants:  GI  Procedures:  EGD with APC 12/5  Antimicrobials:  None   Subjective: Patient seen and evaluated today with no new acute complaints or concerns. No acute concerns or events noted overnight.  Objective: Vitals:   09/07/22 0908 09/07/22 1157 09/07/22 1330 09/07/22 1433  BP: 106/60  (!) 133/56 (!) (P) 100/36  Pulse: 87 75 77   Resp: 14 18 18    Temp: 98.4 F (36.9 C)  98.2 F (36.8 C) (P) 98 F (36.7 C)  TempSrc: Oral  Oral   SpO2: 99% 95% 100% (P) 100%  Weight: 101.1 kg     Height: 5' 6"  (1.676 m)       Intake/Output Summary (Last 24 hours) at 09/07/2022 1507 Last data filed at 09/07/2022 1430 Gross per 24 hour  Intake 300 ml  Output 125 ml  Net 175 ml   Filed Weights   09/07/22 0908  Weight: 101.1 kg    Examination:  General exam: Appears calm and comfortable  Respiratory system: Clear to auscultation. Respiratory effort normal. Cardiovascular system: S1 & S2 heard, RRR.  Gastrointestinal system:  Abdomen is soft, colostomy with pink stump and dark stool noted Central nervous system: Alert and awake Extremities: No edema Skin: No significant lesions noted Psychiatry: Flat affect.    Data Reviewed: I have personally reviewed following labs and imaging studies  CBC: Recent Labs  Lab 09/06/22 1554 09/07/22 0511  WBC 2.6* 2.9*  NEUTROABS 1.3*  --   HGB 10.4* 9.6*  HCT 32.6* 29.4*  MCV 100.9*  100.3*  PLT 47* 65*   Basic Metabolic Panel: Recent Labs  Lab 09/06/22 1554 09/07/22 0511  NA 133* 137  K 4.1 3.8  CL 100 104  CO2 26 30  GLUCOSE 132* 73  BUN 19 15  CREATININE 1.44* 1.20  CALCIUM 8.1* 8.6*  MG  --  2.1  PHOS  --  3.8   GFR: Estimated Creatinine Clearance: 75.6 mL/min (by C-G formula based on SCr of 1.2 mg/dL). Liver Function Tests: Recent Labs  Lab 09/06/22 1554 09/07/22 0511  AST 53* 47*  ALT 22 19  ALKPHOS 112 98  BILITOT 0.8 0.8  PROT 7.1 6.6  ALBUMIN 2.1* 1.9*   No results for input(s): "LIPASE", "AMYLASE" in the last 168 hours. No results for input(s): "AMMONIA" in the last 168 hours. Coagulation Profile: Recent Labs  Lab 09/06/22 2210  INR 1.6*   Cardiac Enzymes: No results for input(s): "CKTOTAL", "CKMB", "CKMBINDEX", "TROPONINI" in the last 168 hours. BNP (last 3 results) No results for input(s): "PROBNP" in the last 8760 hours. HbA1C: No results for input(s): "HGBA1C" in the last 72 hours. CBG: Recent Labs  Lab 09/06/22 1224 09/07/22 0059 09/07/22 0337 09/07/22 0803 09/07/22 1158  GLUCAP 104* 132* 102* 74 76   Lipid Profile: No results for input(s): "CHOL", "HDL", "LDLCALC", "TRIG", "CHOLHDL", "LDLDIRECT" in the last 72 hours. Thyroid Function Tests: No results for input(s): "TSH", "T4TOTAL", "FREET4", "T3FREE", "THYROIDAB" in the last 72 hours. Anemia Panel: No results for input(s): "VITAMINB12", "FOLATE", "FERRITIN", "TIBC", "IRON", "RETICCTPCT" in the last 72 hours. Sepsis Labs: No results for input(s): "PROCALCITON", "LATICACIDVEN" in the last 168 hours.  Recent Results (from the past 240 hour(s))  Resp panel by RT-PCR (RSV, Flu A&B, Covid) Anterior Nasal Swab     Status: None   Collection Time: 09/06/22  5:33 PM   Specimen: Anterior Nasal Swab  Result Value Ref Range Status   SARS Coronavirus 2 by RT PCR NEGATIVE NEGATIVE Final    Comment: (NOTE) SARS-CoV-2 target nucleic acids are NOT DETECTED.  The SARS-CoV-2  RNA is generally detectable in upper respiratory specimens during the acute phase of infection. The lowest concentration of SARS-CoV-2 viral copies this assay can detect is 138 copies/mL. A negative result does not preclude SARS-Cov-2 infection and should not be used as the sole basis for treatment or other patient management decisions. A negative result may occur with  improper specimen collection/handling, submission of specimen other than nasopharyngeal swab, presence of viral mutation(s) within the areas targeted by this assay, and inadequate number of viral copies(<138 copies/mL). A negative result must be combined with clinical observations, patient history, and epidemiological information. The expected result is Negative.  Fact Sheet for Patients:  EntrepreneurPulse.com.au  Fact Sheet for Healthcare Providers:  IncredibleEmployment.be  This test is no t yet approved or cleared by the [place] FDA and  has been authorized for detection and/or diagnosis of SARS-CoV-2 by FDA under an Emergency Use Authorization (EUA). This EUA will remain  in effect (meaning this test can be used) for the duration of the COVID-19 declaration  under Section 564(b)(1) of the Act, 21 U.S.C.section 360bbb-3(b)(1), unless the authorization is terminated  or revoked sooner.       Influenza A by PCR NEGATIVE NEGATIVE Final   Influenza B by PCR NEGATIVE NEGATIVE Final    Comment: (NOTE) The Xpert Xpress SARS-CoV-2/FLU/RSV plus assay is intended as an aid in the diagnosis of influenza from Nasopharyngeal swab specimens and should not be used as a sole basis for treatment. Nasal washings and aspirates are unacceptable for Xpert Xpress SARS-CoV-2/FLU/RSV testing.  Fact Sheet for Patients: EntrepreneurPulse.com.au  Fact Sheet for Healthcare Providers: IncredibleEmployment.be  This test is not yet approved or cleared by the  [place] FDA and has been authorized for detection and/or diagnosis of SARS-CoV-2 by FDA under an Emergency Use Authorization (EUA). This EUA will remain in effect (meaning this test can be used) for the duration of the COVID-19 declaration under Section 564(b)(1) of the Act, 21 U.S.C. section 360bbb-3(b)(1), unless the authorization is terminated or revoked.     Resp Syncytial Virus by PCR NEGATIVE NEGATIVE Final    Comment: (NOTE) Fact Sheet for Patients: EntrepreneurPulse.com.au  Fact Sheet for Healthcare Providers: IncredibleEmployment.be  This test is not yet approved or cleared by the [place] FDA and has been authorized for detection and/or diagnosis of SARS-CoV-2 by FDA under an Emergency Use Authorization (EUA). This EUA will remain in effect (meaning this test can be used) for the duration of the COVID-19 declaration under Section 564(b)(1) of the Act, 21 U.S.C. section 360bbb-3(b)(1), unless the authorization is terminated or revoked.  Performed at [facility], [address], [place], [place] [address]          Radiology Studies: CT ABDOMEN PELVIS W CONTRAST  Result Date: 09/06/2022 CLINICAL DATA:  Acute abdominal pain.  Bowel resection 2 years ago. EXAM: CT ABDOMEN AND PELVIS WITH CONTRAST TECHNIQUE: Multidetector CT imaging of the abdomen and pelvis was performed using the standard protocol following bolus administration of intravenous contrast. RADIATION DOSE REDUCTION: This exam was performed according to the departmental dose-optimization program which includes automated exposure control, adjustment of the mA and/or kV according to patient size and/or use of iterative reconstruction technique. CONTRAST:  127m OMNIPAQUE IOHEXOL 300 MG/ML  SOLN COMPARISON:  CT abdomen 11/11/2021 FINDINGS: Lower chest: No acute abnormality. Hepatobiliary: No focal liver abnormality is seen. No gallstones, gallbladder wall thickening, or  biliary dilatation. Pancreas: Unremarkable. No pancreatic ductal dilatation or surrounding inflammatory changes. Spleen: Borderline enlarged, unchanged. Adrenals/Urinary Tract: Bladder is distended. There is no hydronephrosis or perinephric fat stranding. There is left renal cortical scarring. Adrenal glands are within normal limits. Stomach/Bowel: Patient is status post colectomy. Right lower quadrant ostomy is present. Mild fat stranding and small lymph nodes are seen in the anterior abdominal wall at the level of the ostomy. No focal fluid collection. No bowel obstruction, pneumatosis or free air. The stomach is within normal limits. Vascular/Lymphatic: Aorta and IVC are normal in size. 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 No enlarged lymph nodes are seen. Reproductive: Prostate gland is small or absent. Other: No ascites. No focal abdominal wall hernia. Midline abdominal scar is present. Musculoskeletal: There is a right hip arthroplasty in anatomic alignment. IMPRESSION: 1. Status post colectomy with right lower quadrant ostomy. Mild fat stranding and small lymph nodes in the anterior abdominal wall at the level of the ostomy. Correlate clinically for infection. No focal fluid collection. 2. No bowel obstruction or free air. 3. Distended bladder. 4. Stable gastrohepatic varices.  Electronically Signed   By: Ronney Asters M.D.   On: 09/06/2022 19:30        Scheduled Meds:  [MAR Hold] atorvastatin  80 mg Oral Daily   [MAR Hold] budesonide (PULMICORT) nebulizer solution  0.25 mg Nebulization BID   [MAR Hold] feeding supplement (GLUCERNA SHAKE)  237 mL Oral TID BM   [START ON 09/08/2022] influenza vac split quadrivalent PF  0.5 mL Intramuscular Tomorrow-1000   [MAR Hold] insulin aspart  0-15 Units Subcutaneous Q4H   [MAR Hold] pantoprazole (PROTONIX) IV  40 mg Intravenous Q12H   [MAR Hold] sertraline  50 mg Oral Daily   [MAR Hold] umeclidinium bromide  1 puff Inhalation Daily    Continuous Infusions:  sodium chloride     [MAR Hold] ciprofloxacin 400 mg (09/07/22 1139)   lactated ringers 50 mL/hr at 09/07/22 1325     LOS: 0 days    Time spent: 35 minutes    Mariaisabel Bodiford D Manuella Ghazi, DO [facility]  If 7PM-7AM, please contact night-coverage [email or web address] 09/07/2022, 3:07 PM

## 2022-09-07 NOTE — Consult Note (Addendum)
Gastroenterology Consult   Referring Provider: Dr. Bernadette Hoit Primary Care Physician:  [name], MD Primary Gastroenterologist:  [facility]  Patient ID: [name]; [id number]; 08-Feb-1966   Admit date: 09/06/2022  LOS: 0 days   Date of Consultation: 09/07/2022  Reason for Consultation:  GI bleed   History of Present Illness   [name] is a 56 y.o. 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 Presenting this admission with weakness, reports of melena, chronic pancytopenia. GI consulted due to GI bleed.   In ED: Hgb 10.4, down from 11.6 in Sept 2023. This morning 9.6. Platelets initially 47, now 65 this morning. Albumin 2.1, AST 53. INR 1.6. CRP 1.3, Sed rate 10. Heme positive. CT abd/pelvis with contrast with mild fat stranding and small lymph nodes in anterior abdominal wall at level of ostomy, stable gastrohepatic varices. Discussed with Dr. Adrian Prince at [facility] Radiology: presence of gastric varices, no obvious esophageal varices, spleen upper limits normal, no cirrhosis on imaging.   In regards to IBD, he was previously on 6MP and methotrexate, which were discontinued in Aug 2022 due to worsening pancytopenia. Humira last year but discontinued due to pseudomonal UTI with hospitalization. He is currently on Cimzia and followed by [facility]. 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    Today:  States he fell Saturday. Felt weak. [place] again yesterday. Legs felt weak. Notes black stool for the past week, with last episode last night. Stool is brown in ostomy bag today. On oral iron but stool has been brown  previously while taking oral iron. Notes abdominal pain chronically and feels more at umbilicus today. Some postprandial abdominal pain. No NSAIDs. Takes tylenol. NO ETOH. Smokes 1/2 ppd. Alternating diarrhea and constipation chronically. On Cimzia. No dysphagia. He is NPO. No confusion or mental status changes.   Last dose of Eliquis Monday morning.    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[place])    right, s/p replacement, due to prednisone   Bipolar disorder ([place])    Colostomy in place [facility])    Crohn's colitis ([place])    s/p total colectomy with ileostomy in 2009   Crohn's disease of small intestine [facility]) Sept 2012   ileoscopy: multiple ulcers likely secondary to [place], HX OF 02/11/2009   Qualifier: Diagnosis of  By: Zeb Comfort     Depression    DVT (deep venous thrombosis) ([place]) 2009   right upper extremity due to PICC   Enterococcus UTI 2009   GERD (gastroesophageal reflux disease)    Hernia    Hyperlipidemia    IDA (iron deficiency anemia) 06/17/2022   Insomnia    Migraine headache    Nystagmus    PULMONARY EMBOLISM, HX OF 02/11/2009   Qualifier: Diagnosis of  By: Zeb Comfort     S/P endoscopy Sept 2012   mild gastritis, small hiatal hernia, no ulcers   Schizophrenia ([place])    Schizophrenia, acute ([place])  Past Surgical History:  Procedure Laterality Date   APPENDECTOMY     CYSTOSCOPY WITH DIRECT VISION INTERNAL URETHROTOMY N/A 03/25/2022   Procedure: CYSTOSCOPY WITH DIRECT VISION INTERNAL URETHROTOMY;  Surgeon: Cleon Gustin, MD;  Location: [facility]S;  Service: Urology;  Laterality: N/A;   ESOPHAGOGASTRODUODENOSCOPY  07/2010   gastritis,  bx neg for H.Pylori   ESOPHAGOGASTRODUODENOSCOPY N/A 01/08/2014   SLF:NO SOURCE FOR ODYNOPHAGIA IDENTIFIED/Multiple small ulcers in the gastric antrum   EYE SURGERY      ILEOSCOPY  06/29/2011   SLF: ulcers,multiple/small HH/mild gastritis   KIDNEY SURGERY     LAPAROTOMY N/A 06/05/2013   Procedure: EXPLORATORY LAPAROTOMY;  Surgeon: Donato Heinz, MD;  Location: [facility] ORS;  Service: General;  Laterality: N/A;   LYSIS OF ADHESION N/A 06/05/2013   Procedure: LYSIS OF ADHESIONS;  Surgeon: Donato Heinz, MD;  Location: [facility] ORS;  Service: General;  Laterality: N/A;   small bowel capsule study  08/2010   few tiny nonbleeding erosions/ulcers ?secondary to relafen or Crohn's. Entire small bowel not seen.    TOTAL COLECTOMY  2009   with ileostomy at [facility] for refractory disease    TOTAL HIP ARTHROPLASTY     right, due to avascular necrosis from chronic prednisone   TOTAL SHOULDER REPLACEMENT     bilateral   TRANSURETHRAL RESECTION OF PROSTATE N/A 12/03/2019   Procedure: TRANSURETHRAL RESECTION OF THE PROSTATE (TURP);  Surgeon: Cleon Gustin, MD;  Location: [facility];  Service: Urology;  Laterality: N/A;    Prior to Admission medications   Medication Sig Start Date End Date Taking? Authorizing Provider  albuterol (PROVENTIL) (2.5 MG/3ML) 0.083% nebulizer solution Take 2.5 mg by nebulization every 6 (six) hours as needed for wheezing or shortness of breath.   Yes [provider]  albuterol (VENTOLIN HFA) 108 (90 Base) MCG/ACT inhaler Inhale 2 puffs into the lungs every 6 (six) hours as needed for wheezing or shortness of breath.   Yes [provider]  atorvastatin (LIPITOR) 80 MG tablet Take 80 mg by mouth daily.   Yes [provider]  benztropine (COGENTIN) 0.5 MG tablet Take 0.5 mg by mouth daily.    Yes [provider]  CALCIUM 600/VITAMIN D3 600-800 MG-UNIT TABS TAKE (1) TABLET BY MOUTH THREE TIMES DAILY AFTER MEALS. 06/05/21  Yes Mahala Menghini, PA-C  cyanocobalamin 1000 MCG tablet Take 1,000 mcg by mouth daily.   Yes [provider]  divalproex (DEPAKOTE) 250 MG DR tablet Take 250 mg by mouth daily. 11/21/20  Yes [provider]  divalproex (DEPAKOTE) 500 MG DR tablet Take 1,000 mg by mouth at bedtime. 02/03/22  Yes [provider]  ELIQUIS 5 MG TABS tablet Take 5 mg by mouth 2 (two) times daily. 07/14/20  Yes [provider]  ferrous sulfate 325 (65 FE) MG EC tablet Take 1 tablet (325 mg total) by mouth daily with breakfast. 08/23/22  Yes Pennington, Rebekah M, PA-C  folic acid (FOLVITE) 1 MG tablet Take 1 tablet by mouth daily.   Yes [provider]  gabapentin (NEURONTIN) 300 MG capsule Take 900 mg by mouth 3 (three) times daily.   Yes [provider]  hydroxychloroquine (PLAQUENIL) 200 MG tablet Take 1 tablet by mouth 2 (two) times daily. 07/06/21  Yes [provider]  meloxicam (MOBIC) 7.5 MG tablet Take 7.5 mg by mouth daily. 11/08/19  Yes [provider]  metoprolol succinate (TOPROL-XL) 25 MG 24 hr tablet Take 1 tablet (25 mg total)  by mouth in the morning and at bedtime. Take with or immediately following a meal. 12/08/21  Yes Baldwin Jamaica, PA-C  mirtazapine (REMERON) 15 MG tablet Take 15 mg by mouth at bedtime. 06/08/22  Yes [provider]  pantoprazole (PROTONIX) 40 MG tablet TAKE (1) TABLET BY MOUTH TWICE A DAY BEFORE MEALS. (BREAKFAST AND SUPPER) 08/01/20  Yes Mahala Menghini, PA-C  risperiDONE (RISPERDAL) 1 MG tablet Take 1 mg by mouth at bedtime.   Yes [provider]  risperiDONE (RISPERDAL) 3 MG tablet Take 6 mg by mouth at bedtime. 05/12/21  Yes [provider]  sertraline (ZOLOFT) 50 MG tablet Take 50 mg by mouth daily.   Yes [provider]  tamsulosin (FLOMAX) 0.4 MG CAPS capsule Take 0.4 mg by mouth daily. 07/03/20  Yes [provider]  torsemide (DEMADEX) 20 MG tablet Take 1 tablet (20 mg total) by mouth 2 (two) times daily. 07/29/22  Yes Monge, Helane Gunther, NP  traZODone (DESYREL) 100 MG tablet Take 100 mg by mouth at bedtime. 05/12/21  Yes [provider]  cefdinir (OMNICEF) 300 MG capsule  Take 1 capsule (300 mg total) by mouth 2 (two) times daily. Patient not taking: Reported on 09/07/2022 08/30/22   Summerlin, Berneice Heinrich, PA-C  dicyclomine (BENTYL) 10 MG capsule TAKE 1 CAPSULE BY MOUTH BEFORE MEALS AND AT BEDTIME AS NEEDED FOR ABDOMINAL CRAMPING. *HOLD FOR CONSTIPATION* Patient not taking: Reported on 09/07/2022 06/05/21   Mahala Menghini, PA-C  docusate sodium (COLACE) 100 MG capsule Take 1 capsule (100 mg total) by mouth 2 (two) times daily. Patient not taking: Reported on 09/07/2022 02/14/18   Kathie Dike, MD  Misc. Devices (WALKER WHEELS) MISC USE AS DIRECTED 06/06/19   Fields, Marga Melnick, MD  mometasone-formoterol [facility]) 200-5 MCG/ACT AERO Inhale 2 puffs into the lungs in the morning and at bedtime. Patient not taking: Reported on 09/07/2022 01/19/21   Freddi Starr, MD  nitroGLYCERIN (NITROSTAT) 0.4 MG SL tablet Place 1 tablet (0.4 mg total) under the tongue every 5 (five) minutes as needed for chest pain. 01/26/21 07/11/21  Elouise Munroe, MD  oxyCODONE-acetaminophen (PERCOCET) 5-325 MG tablet Take 1 tablet by mouth every 4 (four) hours as needed for severe pain. Patient not taking: Reported on 09/07/2022 03/26/22 03/26/23  Cleon Gustin, MD  Tiotropium Bromide Monohydrate (SPIRIVA RESPIMAT) 2.5 MCG/ACT AERS Inhale 2 puffs into the lungs daily. Patient not taking: Reported on 09/07/2022 01/19/21   Freddi Starr, MD    Current Facility-Administered Medications  Medication Dose Route Frequency Provider Last Rate Last Admin   albuterol (PROVENTIL) (2.5 MG/3ML) 0.083% nebulizer solution 2.5 mg  2.5 mg Nebulization Q6H PRN Adefeso, Oladapo, DO       atorvastatin (LIPITOR) tablet 80 mg  80 mg Oral Daily Adefeso, Oladapo, DO       budesonide (PULMICORT) nebulizer solution 0.25 mg  0.25 mg Nebulization BID Adefeso, Oladapo, DO       feeding supplement (GLUCERNA SHAKE) (GLUCERNA SHAKE) liquid 237 mL  237 mL Oral TID BM Adefeso, Oladapo, DO       [START ON 09/08/2022]  influenza vac split quadrivalent PF (FLUARIX) injection 0.5 mL  0.5 mL Intramuscular Tomorrow-1000 Shah, Pratik D, DO       insulin aspart (novoLOG) injection 0-15 Units  0-15 Units Subcutaneous Q4H Adefeso, Oladapo, DO   2 Units at 09/07/22 0113   ondansetron (ZOFRAN) tablet 4 mg  4 mg Oral Q6H PRN Bernadette Hoit, DO  Or   ondansetron (ZOFRAN) injection 4 mg  4 mg Intravenous Q6H PRN Adefeso, Oladapo, DO       pantoprazole (PROTONIX) injection 40 mg  40 mg Intravenous Q12H Adefeso, Oladapo, DO       sertraline (ZOLOFT) tablet 50 mg  50 mg Oral Daily Adefeso, Oladapo, DO       umeclidinium bromide (INCRUSE ELLIPTA) 62.5 MCG/ACT 1 puff  1 puff Inhalation Daily Adefeso, Oladapo, DO        Allergies as of 09/06/2022 - Review Complete 09/06/2022  Allergen Reaction Noted   Penicillins Anaphylaxis    Bactrim [sulfamethoxazole-trimethoprim] Other (See Comments) 11/07/2013   Oxycodone-acetaminophen Other (See Comments) 04/17/2012   Remicade [infliximab] Other (See Comments) 04/18/2013   Aspirin     Codeine     Ibuprofen     Macrobid [nitrofurantoin] Itching 10/08/2021   Tramadol Nausea Only 11/04/2011   Tramadol hcl     Vicodin [hydrocodone-acetaminophen] Nausea Only 05/17/2011    Family History  Problem Relation Age of Onset   Diabetes Mother    Heart disease Mother    Diabetes Father    Heart disease Father    COPD Maternal Grandmother    Colon cancer Neg Hx     Social History   Socioeconomic History   Marital status: Single    Spouse name: Not on file   Number of children: Not on file   Years of education: Not on file   Highest education level: Not on file  Occupational History   Not on file  Tobacco Use   Smoking status: Every Day    Packs/day: 1.00    Years: 37.00    Total pack years: 37.00    Types: Cigarettes   Smokeless tobacco: Never   Tobacco comments:    down to 4ppd 11/14/20; down to 1ppd 09/07/2022  Vaping Use   Vaping Use: Never used  Substance and  Sexual Activity   Alcohol use: No   Drug use: No   Sexual activity: Never  Other Topics Concern   Not on file  Social History Narrative   Girlfriend of six years, Rectal Cancer on Hospice (05/2011).   Social Determinants of Health   Financial Resource Strain: Not on file  Food Insecurity: Food Insecurity Present (09/07/2022)   Hunger Vital Sign    Worried About Running Out of Food in the Last Year: Often true    Ran Out of Food in the Last Year: Often true  Transportation Needs: No Transportation Needs (09/07/2022)   PRAPARE - Hydrologist (Medical): No    Lack of Transportation (Non-Medical): No  Physical Activity: Not on file  Stress: Not on file  Social Connections: Not on file  Intimate Partner Violence: Not At Risk (09/07/2022)   Humiliation, Afraid, Rape, and Kick questionnaire    Fear of Current or Ex-Partner: No    Emotionally Abused: No    Physically Abused: No    Sexually Abused: No     Review of Systems   Gen: see HPI CV: Denies chest pain, heart palpitations, syncope, edema  Resp: Denies shortness of breath with rest, cough, wheezing, coughing up blood, and pleurisy. GI: see HPI GU : Denies urinary burning, blood in urine, urinary frequency, and urinary incontinence. MS: see HPI Derm: Denies rash, itching, dry skin, hives. Psych: Denies depression, anxiety, memory loss, hallucinations, and confusion. Heme: Denies bruising or bleeding Neuro:  Denies any headaches, dizziness, paresthesias, shaking  Physical Exam   Vital  Signs in last 24 hours: Temp:  [98 F (36.7 C)-98.4 F (36.9 C)] 98.4 F (36.9 C) (12/05 0908) Pulse Rate:  [71-88] 87 (12/05 0908) Resp:  [6-18] 14 (12/05 0908) BP: (94-117)/(48-90) 106/60 (12/05 0908) SpO2:  [93 %-99 %] 99 % (12/05 0908) Weight:  [101.1 kg] 101.1 kg (12/05 0908) Last BM Date : 09/07/22  General:   Alert,  Well-developed, well-nourished, pleasant and cooperative in NAD Head:  Normocephalic  and atraumatic. Eyes:  Sclera clear, no icterus.   Ears:  Normal auditory acuity. Lungs:  Clear throughout to auscultation.    Heart:  S1 S2 present Abdomen:  Soft, mild TTP periumbilically and nondistended. Pink stoma with ostomy bag in place RLQ. Brown stool, liquid with some soft pieces.   Rectal: deferred   Msk:  Symmetrical without gross deformities. Normal posture. Extremities:  With trace lower extremity edema. Chronic venous stasis changes.  Neurologic:  Alert and  oriented x4. Skin:  Intact without significant lesions or rashes. Psych:  Alert and cooperative. Normal mood and affect.  Intake/Output from previous day: No intake/output data recorded. Intake/Output this shift: Total I/O In: -  Out: 125 [Urine:125]   Labs/Studies   Recent Labs Recent Labs    09/06/22 1554 09/07/22 0511  WBC 2.6* 2.9*  HGB 10.4* 9.6*  HCT 32.6* 29.4*  PLT 47* 65*   BMET Recent Labs    09/06/22 1554 09/07/22 0511  NA 133* 137  K 4.1 3.8  CL 100 104  CO2 26 30  GLUCOSE 132* 73  BUN 19 15  CREATININE 1.44* 1.20  CALCIUM 8.1* 8.6*   LFT Recent Labs    09/06/22 1554 09/07/22 0511  PROT 7.1 6.6  ALBUMIN 2.1* 1.9*  AST 53* 47*  ALT 22 19  ALKPHOS 112 98  BILITOT 0.8 0.8   PT/INR Recent Labs    09/06/22 2210  LABPROT 18.6*  INR 1.6*   Radiology/Studies CT ABDOMEN PELVIS W CONTRAST  Result Date: 09/06/2022 CLINICAL DATA:  Acute abdominal pain.  Bowel resection 2 years ago. EXAM: CT ABDOMEN AND PELVIS WITH CONTRAST TECHNIQUE: Multidetector CT imaging of the abdomen and pelvis was performed using the standard protocol following bolus administration of intravenous contrast. RADIATION DOSE REDUCTION: This exam was performed according to the departmental dose-optimization program which includes automated exposure control, adjustment of the mA and/or kV according to patient size and/or use of iterative reconstruction technique. CONTRAST:  159m OMNIPAQUE IOHEXOL 300 MG/ML   SOLN COMPARISON:  CT abdomen 11/11/2021 FINDINGS: Lower chest: No acute abnormality. Hepatobiliary: No focal liver abnormality is seen. No gallstones, gallbladder wall thickening, or biliary dilatation. Pancreas: Unremarkable. No pancreatic ductal dilatation or surrounding inflammatory changes. Spleen: Borderline enlarged, unchanged. Adrenals/Urinary Tract: Bladder is distended. There is no hydronephrosis or perinephric fat stranding. There is left renal cortical scarring. Adrenal glands are within normal limits. Stomach/Bowel: Patient is status post colectomy. Right lower quadrant ostomy is present. Mild fat stranding and small lymph nodes are seen in the anterior abdominal wall at the level of the ostomy. No focal fluid collection. No bowel obstruction, pneumatosis or free air. The stomach is within normal limits. Vascular/Lymphatic: Aorta and IVC are normal in size. 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 No enlarged lymph nodes are seen. Reproductive: Prostate gland is small or absent. Other: No ascites. No focal abdominal wall hernia. Midline abdominal scar is present. Musculoskeletal: There is a right hip arthroplasty in anatomic alignment. IMPRESSION: 1. Status post colectomy with right lower quadrant  ostomy. Mild fat stranding and small lymph nodes in the anterior abdominal wall at the level of the ostomy. Correlate clinically for infection. No focal fluid collection. 2. No bowel obstruction or free air. 3. Distended bladder. 4. Stable gastrohepatic varices. Electronically Signed   By: Ronney Asters M.D.   On: 09/06/2022 19:30     Assessment   KELDAN EPLIN is a 56 y.o. 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 Presenting this admission with weakness,  reports of melena, chronic pancytopenia. GI consulted due to GI bleed.   Heme positive stool, melena, and worsening anemia in setting of Eliquis: notably, has chronic pancytopenia and followed by Hematology. Hgb 10.4 on admission, which is down from 11.6 in Sept 2023. This morning 9.6. Melena for one week prior to admission, but stool is brown today. He has presence of gastric varices on CT; this was reviewed with Radiology. No cirrhosis seen on imaging. Spleen borderline enlarged. Due to concern for bleeding gastric varices, will start Octreotide, prophylactic antibiotics, and pursue EGD today. Last EGD in 2015 with multiple small ulcers in gastric antrum.   IBD: continue follow-up at [place]. Complicated prior history.   Pancytopenia: continue follow-up with Hematology. Consider [place] elastography, doppler US for further evaluation.  Mildly elevated AST: on multiple prior occasions. Can follow as outpatient.  Last dose of Eliquis Monday morning.    Plan / Recommendations    Remain NPO IV PPI BID Start Octreotide Empiric antibiotics in setting of concern for gastric varices. Anaphylaxis with PCNs. Will use Cipro BID EGD with Dr. Jenetta Downer today. Discussed risks and benefits with patient who states understanding. Further recommendations to follow.      09/07/2022, 9:48 AM  Annitta Needs, PhD, ANP-BC [facility]

## 2022-09-07 NOTE — TOC Initial Note (Signed)
Transition of Care [facility]) - Initial/Assessment Note    Patient Details  Name: [name] MRN: [id number] Date of Birth: [birth year]  Transition of Care [facility]) CM/SW Contact:    Shade Flood, LCSW Phone Number: 09/07/2022, 1:55 PM  Clinical Narrative:                  Pt admitted from home. Received TOC consult for PT for pt at dc. Spoke with RN, MD, and pt about options for outpatient rehab as TOC unable to arrange [facility] for pt due to his insurance. Pt agreeable to outpatient PT referral and he states that he has transportation available through his insurance.  Referral sent to [facility] outpatient PT at pt request.  SDOH resource information added to pt's AVS.  No other TOC needs identified at this time. Will follow.  Expected Discharge Plan: Home/Self Care Barriers to Discharge: Continued Medical Work up   Patient Goals and CMS Choice Patient states their goals for this hospitalization and ongoing recovery are:: go home CMS Medicare.gov Compare Post Acute Care list provided to:: Patient Choice offered to / list presented to : Patient  Expected Discharge Plan and Services Expected Discharge Plan: Home/Self Care In-house Referral: Clinical Social Work     Living arrangements for the past 2 months: Apartment                                      Prior Living Arrangements/Services Living arrangements for the past 2 months: Apartment Lives with:: Self Patient language and need for interpreter reviewed:: Yes Do you feel safe going back to the place where you live?: Yes      Need for Family Participation in Patient Care: No (Comment)     Criminal Activity/Legal Involvement Pertinent to Current Situation/Hospitalization: No - Comment as needed  Activities of Daily Living Home Assistive Devices/Equipment: Shower chair with back, Environmental consultant (specify type) (rollator) ADL Screening (condition at time of admission) Patient's cognitive ability adequate to safely complete daily  activities?: Yes Is the patient deaf or have difficulty hearing?: No Does the patient have difficulty seeing, even when wearing glasses/contacts?: No Does the patient have difficulty concentrating, remembering, or making decisions?: No Patient able to express need for assistance with ADLs?: Yes Does the patient have difficulty dressing or bathing?: Yes Independently performs ADLs?: No Communication: Independent Dressing (OT): Independent Grooming: Independent Feeding: Independent Bathing: Needs assistance Is this a change from baseline?: Pre-admission baseline Toileting: Independent with device (comment) (walker, colostomy bag) In/Out Bed: Independent Walks in Home: Needs assistance Is this a change from baseline?: Pre-admission baseline Does the patient have difficulty walking or climbing stairs?: Yes Weakness of Legs: Both Weakness of Arms/Hands: Both  Permission Sought/Granted Permission sought to share information with : Chartered certified accountant granted to share information with : Yes, Verbal Permission Granted     Permission granted to share info w AGENCY: outpatient rehab        Emotional Assessment       Orientation: : Oriented to Self, Oriented to Place, Oriented to  Time, Oriented to Situation Alcohol / Substance Use: Not Applicable Psych Involvement: No (comment)  Admission diagnosis:  Thrombocytopenia ([place]) [D69.6] GI bleed [K92.2] Gastrointestinal hemorrhage with melena [K92.1] Abdominal pain, unspecified abdominal location [R10.9] Patient Active Problem List   Diagnosis Date Noted   Pancytopenia ([place]) 09/07/2022   AKI (acute kidney injury) ([place]) 09/07/2022   Hypoalbuminemia due to  protein-calorie malnutrition ([place]) 09/07/2022   History of DVT (deep vein thrombosis) 09/07/2022   History of pulmonary embolism 09/07/2022   GI bleed 09/06/2022   Iron deficiency anemia 06/17/2022   Bladder neck contracture 57/89/7847   Acute metabolic  encephalopathy 84/09/8207   Hyponatremia 05/16/2021   DM type 2 (diabetes mellitus, type 2) ([place]) 05/16/2021   Diarrhea 09/04/2020   Abdominal pain 09/04/2020   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) 07/28/2020   At risk for osteopenia 02/06/2020   BPH (benign prostatic hyperplasia) 12/03/2019   Incomplete emptying of bladder 10/31/2019   Benign prostatic hyperplasia with urinary obstruction 10/31/2019   Falls, initial encounter 06/06/2019   Lactic acidosis 02/10/2018   Partial small bowel obstruction ([place]) 02/10/2018   Acute lower UTI 12/31/2017   SBO (small bowel obstruction) ([place]) 12/29/2017   Arthritis of knee, degenerative 02/14/2013   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 03/29/2012   Crohn's disease of both small and large intestine with complication ([place]) 13/88/7195   Hepatic steatosis 07/19/2011   Hyperlipidemia 02/11/2009   BIPOLAR AFFECTIVE DISORDER 02/11/2009   Nystagmus 02/11/2009   ASTHMA 02/11/2009   GERD 02/11/2009   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

## 2022-09-07 NOTE — Evaluation (Signed)
Physical Therapy Evaluation Patient Details Name: [name] MRN: [id number] DOB: 08-Jan-1966 Today's Date: 09/07/2022  History of Present Illness  [name] is a 56 y.o. male who was admitted on 09/06/22 due to 2-week onset of bilateral lower extremity weakness. 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   Clinical Impression  Patient functioning near baseline for functional mobility and gait. Patient able to ambulate in hallway with RW/supervision and no loss of balance, limited mostly due to fatigue. Patient tolerated sitting up in chair after therapy. Plan: patient discharged from physical therapy to care of nursing for ambulation daily as tolerated for length of stay.     Recommendations for follow up therapy are one component of a multi-disciplinary discharge planning process, led by the attending physician.  Recommendations may be updated based on patient status, additional functional criteria and insurance authorization.  Follow Up Recommendations Home health PT      Assistance Recommended at Discharge PRN  Patient can return home with the following  Assistance with cooking/housework;Help with stairs or ramp for entrance    Equipment Recommendations None recommended by PT  Recommendations for Other Services       Functional Status Assessment Patient has not had a recent decline in their functional status     Precautions / Restrictions Precautions Precautions: Fall Restrictions Weight Bearing Restrictions: No      Mobility  Bed Mobility Overal bed mobility: Modified Independent             General bed mobility comments: increased time, slightly labored movement but did not require therapist assist    Transfers Overall transfer level: Needs assistance Equipment used: Rolling walker (2 wheels) Transfers: Sit to/from Stand, Bed to  chair/wheelchair/BSC Sit to Stand: Supervision   Step pivot transfers: Supervision       General transfer comment: patient did require 2-3 attempts to stand but able to transfer to chair safely with RW/supervision    Ambulation/Gait Ambulation/Gait assistance: Supervision Gait Distance (Feet): 50 Feet Assistive device: Rolling walker (2 wheels) Gait Pattern/deviations: Decreased step length - right, Decreased step length - left, Decreased stride length Gait velocity: decreased     General Gait Details: patient able to ambulate in hallway with supervision/RW with no loss of balance, limited mostly due to fatigue  Stairs            Wheelchair Mobility    Modified Rankin (Stroke Patients Only)       Balance Overall balance assessment: Needs assistance Sitting-balance support: No upper extremity supported, Feet supported Sitting balance-Leahy Scale: Good Sitting balance - Comments: seated EOB   Standing balance support: Bilateral upper extremity supported, During functional activity, Reliant on assistive device for balance Standing balance-Leahy Scale: Good Standing balance comment: with RW                             Pertinent Vitals/Pain Pain Assessment Pain Assessment: No/denies pain    Home Living Family/patient expects to be discharged to:: Private residence Living Arrangements: Alone Available Help at Discharge: Personal care attendant Type of Home: Apartment Home Access: Level entry       Home Layout: One level Home Equipment: Rollator (4 wheels);Cane - quad;Shower seat;Grab bars - tub/shower Additional Comments: patient has a caregiver who comes in every day for 3 hours and assists with cleaning    Prior Function Prior  Level of Function : Independent/Modified Independent             Mobility Comments: Short distance community ambulator who uses quad cane and rollator interchangably, does not drive ADLs Comments: Independent,  caregiver assists w/ cleaning     Hand Dominance        Extremity/Trunk Assessment   Upper Extremity Assessment Upper Extremity Assessment: Overall WFL for tasks assessed    Lower Extremity Assessment Lower Extremity Assessment: Overall WFL for tasks assessed    Cervical / Trunk Assessment Cervical / Trunk Assessment: Normal  Communication   Communication: No difficulties  Cognition Arousal/Alertness: Awake/alert Behavior During Therapy: WFL for tasks assessed/performed Overall Cognitive Status: Within Functional Limits for tasks assessed                                          General Comments      Exercises     Assessment/Plan    PT Assessment All further PT needs can be met in the next venue of care  PT Problem List Decreased strength;Decreased activity tolerance;Decreased balance;Decreased mobility       PT Treatment Interventions      PT Goals (Current goals can be found in the Care Plan section)  Acute Rehab PT Goals Patient Stated Goal: return home PT Goal Formulation: With patient Time For Goal Achievement: 09/07/22 Potential to Achieve Goals: Good    Frequency       Co-evaluation               AM-PAC PT "6 Clicks" Mobility  Outcome Measure Help needed turning from your back to your side while in a flat bed without using bedrails?: None Help needed moving from lying on your back to sitting on the side of a flat bed without using bedrails?: None Help needed moving to and from a bed to a chair (including a wheelchair)?: A Little Help needed standing up from a chair using your arms (e.g., wheelchair or bedside chair)?: A Little Help needed to walk in hospital room?: A Little Help needed climbing 3-5 steps with a railing? : A Lot 6 Click Score: 19    End of Session   Activity Tolerance: Patient tolerated treatment well;Patient limited by fatigue Patient left: in chair;with call bell/phone within reach Nurse  Communication: Mobility status PT Visit Diagnosis: Unsteadiness on feet (R26.81);Other abnormalities of gait and mobility (R26.89);Muscle weakness (generalized) (M62.81)    Time: 1102-1117 PT Time Calculation (min) (ACUTE ONLY): 20 min   Charges:   PT Evaluation $PT Eval Moderate Complexity: 1 Mod PT Treatments $Therapeutic Activity: 8-22 mins        Zigmund Gottron, SPT

## 2022-09-07 NOTE — Brief Op Note (Signed)
09/06/2022 - 09/07/2022  2:33 PM  PATIENT:  [name]  56 y.o. 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  Tolerated the procedure adequately. A 1 cm sliding hiatal hernia was found. No varices in the esophagus.Patchy mildly erythematous mucosa with bleeding was found in the gastric fundus - portal hypertensive gastropathy?.  Coagulation for hemostasis using argon plasma at 0.3 liters/minute and 20 watts was successful.  Moderate gastric antral vascular ectasia with scant spontaenous bleeding was present in the gastric antrum. There was a scant amount of fresh blood in the antrum.  Coagulation for hemostasis using argon plasma at 0.3 liters/minute and 20 watts was successful. No gastric varices.Localized mildly erythematous mucosa without active bleeding was found in the first portion of the duodenum and in the second portion of the duodenum.   RECOMMENDATIONS - Return patient to hospital ward for ongoing care.  - Clear liquid diet today.  - Use Protonix (pantoprazole) 40 mg IV BID.  - Stop octreotide and ceftriaxone. - Daily H/H  Maylon Peppers, MD [facility]

## 2022-09-07 NOTE — Anesthesia Postprocedure Evaluation (Signed)
Anesthesia Post Note  Patient: GENEVA PALLAS  Procedure(s) Performed: ESOPHAGOGASTRODUODENOSCOPY (EGD) WITH PROPOFOL HOT HEMOSTASIS (ARGON PLASMA COAGULATION/BICAP)  Patient location during evaluation: Phase II Anesthesia Type: General Level of consciousness: awake and alert and oriented Pain management: pain level controlled Vital Signs Assessment: post-procedure vital signs reviewed and stable Respiratory status: spontaneous breathing, nonlabored ventilation and respiratory function stable Cardiovascular status: blood pressure returned to baseline and stable Postop Assessment: no apparent nausea or vomiting Anesthetic complications: no  No notable events documented.   Last Vitals:  Vitals:   09/07/22 1445 09/07/22 1500  BP: 107/61 107/80  Pulse: 70 70  Resp: 10 15  Temp:    SpO2: 100% 99%    Last Pain:  Vitals:   09/07/22 1500  TempSrc:   PainSc: 0-No pain                 Qadir Folks C Hiroto Saltzman

## 2022-09-07 NOTE — Op Note (Signed)
[facility] Patient Name: [name] Procedure Date: 09/07/2022 2:10 PM MRN: [id number] Date of Birth: [birth year] Attending MD: Maylon Peppers , , [id number] CSN: [id number] Age: 56 Admit Type: Outpatient Procedure:                Upper GI endoscopy Indications:              Melena Providers:                Maylon Peppers, Lambert Mody, Everardo Pacific Referring MD:              Medicines:                Monitored Anesthesia Care Complications:            No immediate complications. Estimated Blood Loss:     Estimated blood loss: none. Procedure:                Pre-Anesthesia Assessment:                           - Prior to the procedure, a History and Physical                            was performed, and patient medications, allergies                            and sensitivities were reviewed. The patient's                            tolerance of previous anesthesia was reviewed.                           - The risks and benefits of the procedure and the                            sedation options and risks were discussed with the                            patient. All questions were answered and informed                            consent was obtained.                           - ASA Grade Assessment: III - A patient with severe                            systemic disease.                           After obtaining informed consent, the endoscope was                            passed under direct vision. Throughout the  procedure, the patient's blood pressure, pulse, and                            oxygen saturations were monitored continuously. The                            GIF-H190 ([id number]) scope was introduced through the                            mouth, and advanced to the second part of duodenum.                            The upper GI endoscopy was accomplished without                             difficulty. The patient tolerated the procedure                            well. Scope In: 2:19:35 PM Scope Out: 2:30:15 PM Total Procedure Duration: 0 hours 10 minutes 40 seconds  Findings:      A 1 cm sliding hiatal hernia was found. No varices in the esophagus.      Patchy mildly erythematous mucosa with bleeding was found in the gastric       fundus - portal hypertensive gastropathy?. Coagulation for hemostasis       using argon plasma at 0.3 liters/minute and 20 watts was successful.      Moderate gastric antral vascular ectasia with scant spontaenous bleeding       was present in the gastric antrum. There was a scant amount of fresh       blood in the antrum. Coagulation for hemostasis using argon plasma at       0.3 liters/minute and 20 watts was successful. No gastric varices.      Localized mildly erythematous mucosa without active bleeding was found       in the first portion of the duodenum and in the second portion of the       duodenum. Impression:               - 1 cm sliding hiatal hernia.                           - Erythematous mucosa in the gastric fundus.                            Treated with argon plasma coagulation (APC).                           - Gastric antral vascular ectasia with bleeding.                            Treated with argon plasma coagulation (APC).                           - Erythematous duodenopathy.                           -  No specimens collected. Moderate Sedation:      Per Anesthesia Care Recommendation:           - Return patient to hospital ward for ongoing care.                           - Clear liquid diet today.                           - Use Protonix (pantoprazole) 40 mg IV BID.                           - Stop octreotide and ceftriaxone.                           - Daily H/H Procedure Code(s):        --- Professional ---                           [phone], Esophagogastroduodenoscopy, flexible,                            transoral;  with control of bleeding, any method Diagnosis Code(s):        --- Professional ---                           K44.9, Diaphragmatic hernia without obstruction or                            gangrene                           K31.89, Other diseases of stomach and duodenum                           K31.811, Angiodysplasia of stomach and duodenum                            with bleeding                           K92.1, Melena (includes Hematochezia) CPT copyright 2022 [employer]. All rights reserved. The codes documented in this report are preliminary and upon coder review may  be revised to meet current compliance requirements. Maylon Peppers, MD Maylon Peppers,  09/07/2022 2:40:14 PM This report has been signed electronically. Number of Addenda: 0

## 2022-09-07 NOTE — Transfer of Care (Signed)
Immediate Anesthesia Transfer of Care Note  Patient: [name]  Procedure(s) Performed: ESOPHAGOGASTRODUODENOSCOPY (EGD) WITH PROPOFOL HOT HEMOSTASIS (ARGON PLASMA COAGULATION/BICAP)  Patient Location: PACU  Anesthesia Type:MAC  Level of Consciousness: awake and drowsy  Airway & Oxygen Therapy: Patient Spontanous Breathing  Post-op Assessment: Report given to RN and Post -op Vital signs reviewed and stable  Post vital signs: Reviewed and stable  Last Vitals:  Vitals Value Taken Time  BP    Temp    Pulse    Resp    SpO2      Last Pain:  Vitals:   09/07/22 1415  TempSrc:   PainSc: 0-No pain      Patients Stated Pain Goal: 5 (17/61/60 7371)  Complications: No notable events documented.

## 2022-09-07 NOTE — Anesthesia Preprocedure Evaluation (Signed)
Anesthesia Evaluation  Patient identified by MRN, date of birth, ID band Patient awake    Reviewed: Allergy & Precautions, NPO status , Patient's Chart, lab work & pertinent test results, reviewed documented beta blocker date and time   Airway Mallampati: II  TM Distance: >3 FB Neck ROM: Full    Dental  (+) Edentulous Upper, Edentulous Lower   Pulmonary shortness of breath and with exertion, asthma , neg COPD,  COPD inhaler, Current Smoker and Patient abstained from smoking., PE   Pulmonary exam normal breath sounds clear to auscultation       Cardiovascular (-) hypertensionPt. on home beta blockers + DVT (eliquis)  Normal cardiovascular exam Rhythm:Regular Rate:Normal  1. Left ventricular ejection fraction, by estimation, is 55 to 60%. The  left ventricle has normal function. Left ventricular endocardial border  not optimally defined to evaluate regional wall motion. Left ventricular  diastolic parameters were normal.   2. Left atrial size was mildly dilated.   3. The mitral valve is normal in structure. Trivial mitral valve  regurgitation.   4. The aortic valve is tricuspid. Mild aortic valve sclerosis is present,  with no evidence of aortic valve stenosis.   5. Tricuspid regurgitation signal is inadequate for assessing PA  pressure.     Neuro/Psych  Headaches PSYCHIATRIC DISORDERS  Depression Bipolar Disorder Schizophrenia     GI/Hepatic ,GERD  Medicated and Poorly Controlled,,(+) neg Cirrhosis  Esophageal Varices    Crohn's colitis, colectomy, ileostomy   Endo/Other  diabetes, Well Controlled, Type 2, Oral Hypoglycemic Agents    Renal/GU Renal InsufficiencyRenal disease  negative genitourinary   Musculoskeletal  (+) Arthritis ,    Abdominal   Peds negative pediatric ROS (+)  Hematology  (+) Blood dyscrasia, anemia   Anesthesia Other Findings   Reproductive/Obstetrics negative OB ROS                               Anesthesia Physical Anesthesia Plan  ASA: 3  Anesthesia Plan: General   Post-op Pain Management: Minimal or no pain anticipated   Induction: Intravenous  PONV Risk Score and Plan: 1 and Propofol infusion  Airway Management Planned: Nasal Cannula and Natural Airway  Additional Equipment:   Intra-op Plan:   Post-operative Plan:   Informed Consent: I have reviewed the patients History and Physical, chart, labs and discussed the procedure including the risks, benefits and alternatives for the proposed anesthesia with the patient or authorized representative who has indicated his/her understanding and acceptance.       Plan Discussed with: CRNA and Surgeon  Anesthesia Plan Comments:          Anesthesia Quick Evaluation

## 2022-09-07 NOTE — Discharge Instructions (Signed)
FOOD - By Leonie Man: [place], [place]  Provides hot meals seven days a week and twice (lunch and afternoon) on Saturdays and  Sundays. [employer] serves lunch Monday - Friday between 11 a.m. and noon. [place] [phone] Come Tuesdays and Wednesdays to sign up from 2-4 pm with I.D. and proof of address.  Distribute food every other Thursday from 9-12.  [facility] - [place] 11:30-1:00 Blessing Pantry across from [place], behind the free clinic in [place].  It is a self-serve and the community can drop things in it. "Give if you can ... take if you  need". Anyone can give and anyone can go by. P a g e  [place] - Updated April 2020  [place], [place]. You have to call the number and leave a message and someone will call  you back to set up a time to come and pick up. [employer] - https://bit.ly/3cO1Az1 Bagged lunches/breakfast - Parents can request service online and then pick up their  meals Monday-Friday from 11:30-12:00. [facility] delivers the meals each day. They sort,  combine and place meals for each family on tables for pickup so that they adhere to social  distancing.  Men in [place], [place]: [address] [place] [phone] Distribution is from 10 a.m. to 1 p.m. the 2nd and 4th Tuesdays. Clients must sign up before  the day of distribution. The [employer], [place]: [place] [facility]ry hours: 9 a.m.-11:45 a.m. Mondays, Wednesdays, Thursdays, and Fridays. The  pantry is closed on Tuesdays. [facility], [facility]ability [facility] Transit [facility] [place] - [phone]  Services Offered: Meals on [place] care, at home assisted living,  [place] for  [employer], transportation [facility] [place] 4th Wednesday of each month 9:30 a.m. to 12 noon [place], [place] [phone] Services Offered: Application and I.D are required. You must have a copy of a current bill.  Call on Tuesdays for appointment and complete application between 0:78-67:54GB.  They will purchase fresh food and you are able to pick the food up in the same afternoon. [facility] [employer] - [place] [address] [place]. [phone] Services Offered: Food assistance on Tuesdays from 2-4 pm with photo I.D. and  proof of address  Sheets - lunch for kids (limited numbers daily)  [employer] - [address] [place] Free kids-meal of hot dog (or grilled cheese), chips and small drink for any student 12 and  under while school is out. Eat in only and limit one per day. P a g e  [place] - Updated April 2020  [place] hot dog, chips, water or juice for kids. [place]: [employer] - [employer], fries and drink. [place] [facility]: [place],  Assists homeless and needy [place] and their families. Open 10 a.m. to 1 p.m. Tuesdays  and Thursdays except holidays. Must show valid military ID to receive food.  [place]: [facility] - Insurance account manager 11:30-1:00  Sheets - lunch for kids (limited numbers daily) [employer]  In - Free hamburger/hot dog, fry and drink. 11am-2 pm, Monday-Friday.  Must be accompanied by an adult. [employer] and go breakfast and lunch, children under 18, must be present. [employer] - https://bit.ly/3cO1Az1 Bagged lunches/breakfast - Parents can request service online and then pick up their  meals Monday-Friday from 11:30-12:00. [facility] delivers the meals each day. We  sort,  combine and place meals for each family on tables for pickup so that we adhere to social  distancing.     [employer] - [address], [place] [phone] Located: Basement of [place] back entrance off [address]  You must call the Ministry before you are able to receive services. Food assistance,  utility assistance if funds are available Corporate treasurer for all of [employer])  ([place], [place] for [facility] area only) Walk-ins with  current ID and current address verification required. Services available on Wednesdays  and Thursdays: 9:30AM-12:00PM. The [place]: [address]  Serves hot meals from noon to 12:45 p.m. Mondays through Fridays; Food Pantry is open from 12:30 p.m. to 2:30 p.m. Mondays through Fridays P a g e  [place] - Updated April 2020  [place], [place] [phone]  Application and I.D are required for services. You must have a copy of the current bill.  Call on Tuesdays for appointment and complete application between 7:34-03:70DU.  This ministry will purchase fresh food and you will pick the food up at the ministry that  afternoon. [facility] - [address] [place] [phone]  [place]: [facility] - [place] 11:30-1:00 [phone] - [place]. [place] [phone]  Food/Groceries Wednesday 10-2 pm Mobile Pantry and Food boxes [place] - https://bit.ly/3cO1Az1 Bagged lunches/breakfast - Parents can request service online and then pick up their  meals Monday-Friday from 11:30-12:00. [facility] delivers the meals each day. We sort,  combine and place meals for each family on tables for pickup so that we adhere to  social distancing.   [facility], [place]: [place],  Food distribution by appointment. Donations  accepted 10 a.m. to noon, Mondays, and  noon to p.m. Thursdays. [facility] also has clothes closet by appointment. [place]: Games developer, shoes, food [place]    IMPORTANT INFORMATION: PAY CLOSE ATTENTION   PHYSICIAN DISCHARGE INSTRUCTIONS  Follow with Primary care provider  [name], MD  and other consultants as instructed by your Hospitalist Physician  [place] IF SYMPTOMS COME BACK, WORSEN OR NEW PROBLEM DEVELOPS   Please note: You were cared for by a hospitalist during your hospital stay. Every effort will be made to forward records to your primary care provider.  You can request that your primary care provider send for your hospital records if they have not received them.  Once you are discharged, your primary care physician will handle any further medical issues. Please note that NO REFILLS for any discharge medications will be authorized once you are discharged, as it is imperative that you return to your primary care physician (or establish a relationship with a primary care physician if you do not have one) for your post hospital discharge needs so that they can reassess your need for  medications and monitor your lab values.  Please get a complete blood count and chemistry panel checked by your Primary MD at your next visit, and again as instructed by your Primary MD.  Get Medicines reviewed and adjusted: Please take all your medications with you for your next visit with your Primary MD  Laboratory/radiological data: Please request your Primary MD to go over all hospital tests and procedure/radiological results at the follow up, please ask your primary care provider to get all Hospital records sent to his/her office.  In some cases, they will be blood work, cultures and biopsy results pending at the time of your discharge. Please request  that your primary care provider follow up on these results.  If you are diabetic, please bring your blood sugar readings with you to your follow up appointment with primary care.    Please call and make your follow up appointments as soon as possible.    Also Note the following: If you experience worsening of your admission symptoms, develop shortness of breath, life threatening emergency, suicidal or homicidal thoughts you must seek medical attention immediately by calling 911 or calling your MD immediately  if symptoms less severe.  You must read complete instructions/literature along with all the possible adverse reactions/side effects for all the Medicines you take and that have been prescribed to you. Take any new Medicines after you have completely understood and accpet all the possible adverse reactions/side effects.   Do not drive when taking Pain medications or sleeping medications (Benzodiazepines)  Do not take more than prescribed Pain, Sleep and Anxiety Medications. It is not advisable to combine anxiety,sleep and pain medications without talking with your primary care practitioner  Special Instructions: If you have smoked or chewed Tobacco  in the last 2 yrs please stop smoking, stop any regular Alcohol  and or any Recreational drug use.  Wear Seat belts while driving.  Do not drive if taking any narcotic, mind altering or controlled substances or recreational drugs or alcohol.

## 2022-09-08 DIAGNOSIS — K31811 Angiodysplasia of stomach and duodenum with bleeding: Secondary | ICD-10-CM | POA: Diagnosis present

## 2022-09-08 DIAGNOSIS — F319 Bipolar disorder, unspecified: Secondary | ICD-10-CM | POA: Diagnosis present

## 2022-09-08 DIAGNOSIS — E785 Hyperlipidemia, unspecified: Secondary | ICD-10-CM | POA: Diagnosis present

## 2022-09-08 DIAGNOSIS — E440 Moderate protein-calorie malnutrition: Secondary | ICD-10-CM | POA: Diagnosis present

## 2022-09-08 DIAGNOSIS — E119 Type 2 diabetes mellitus without complications: Secondary | ICD-10-CM | POA: Diagnosis present

## 2022-09-08 DIAGNOSIS — R531 Weakness: Secondary | ICD-10-CM | POA: Diagnosis present

## 2022-09-08 DIAGNOSIS — D61818 Other pancytopenia: Secondary | ICD-10-CM | POA: Diagnosis present

## 2022-09-08 DIAGNOSIS — Z86718 Personal history of other venous thrombosis and embolism: Secondary | ICD-10-CM | POA: Diagnosis not present

## 2022-09-08 DIAGNOSIS — F17210 Nicotine dependence, cigarettes, uncomplicated: Secondary | ICD-10-CM | POA: Diagnosis present

## 2022-09-08 DIAGNOSIS — Z1152 Encounter for screening for COVID-19: Secondary | ICD-10-CM | POA: Diagnosis not present

## 2022-09-08 DIAGNOSIS — D509 Iron deficiency anemia, unspecified: Secondary | ICD-10-CM | POA: Diagnosis present

## 2022-09-08 DIAGNOSIS — Z6835 Body mass index (BMI) 35.0-35.9, adult: Secondary | ICD-10-CM | POA: Diagnosis not present

## 2022-09-08 DIAGNOSIS — D62 Acute posthemorrhagic anemia: Secondary | ICD-10-CM | POA: Diagnosis present

## 2022-09-08 DIAGNOSIS — J45909 Unspecified asthma, uncomplicated: Secondary | ICD-10-CM | POA: Diagnosis present

## 2022-09-08 DIAGNOSIS — K766 Portal hypertension: Secondary | ICD-10-CM | POA: Diagnosis present

## 2022-09-08 DIAGNOSIS — K922 Gastrointestinal hemorrhage, unspecified: Secondary | ICD-10-CM | POA: Diagnosis not present

## 2022-09-08 DIAGNOSIS — Z8249 Family history of ischemic heart disease and other diseases of the circulatory system: Secondary | ICD-10-CM | POA: Diagnosis not present

## 2022-09-08 DIAGNOSIS — Z833 Family history of diabetes mellitus: Secondary | ICD-10-CM | POA: Diagnosis not present

## 2022-09-08 DIAGNOSIS — Z23 Encounter for immunization: Secondary | ICD-10-CM | POA: Diagnosis not present

## 2022-09-08 DIAGNOSIS — E669 Obesity, unspecified: Secondary | ICD-10-CM | POA: Diagnosis present

## 2022-09-08 DIAGNOSIS — K921 Melena: Secondary | ICD-10-CM | POA: Diagnosis not present

## 2022-09-08 DIAGNOSIS — E8809 Other disorders of plasma-protein metabolism, not elsewhere classified: Secondary | ICD-10-CM | POA: Diagnosis present

## 2022-09-08 DIAGNOSIS — M069 Rheumatoid arthritis, unspecified: Secondary | ICD-10-CM | POA: Diagnosis present

## 2022-09-08 DIAGNOSIS — Z933 Colostomy status: Secondary | ICD-10-CM | POA: Diagnosis not present

## 2022-09-08 DIAGNOSIS — Z86711 Personal history of pulmonary embolism: Secondary | ICD-10-CM | POA: Diagnosis not present

## 2022-09-08 DIAGNOSIS — N179 Acute kidney failure, unspecified: Secondary | ICD-10-CM | POA: Diagnosis present

## 2022-09-08 DIAGNOSIS — K5080 Crohn's disease of both small and large intestine without complications: Secondary | ICD-10-CM | POA: Diagnosis present

## 2022-09-08 LAB — BASIC METABOLIC PANEL
Anion gap: 3 — ABNORMAL LOW (ref 5–15)
BUN: 11 mg/dL (ref 6–20)
CO2: 27 mmol/L (ref 22–32)
Calcium: 7.9 mg/dL — ABNORMAL LOW (ref 8.9–10.3)
Chloride: 104 mmol/L (ref 98–111)
Creatinine, Ser: 0.94 mg/dL (ref 0.61–1.24)
GFR, Estimated: >60 mL/min (ref >60)
Glucose, Bld: 72 mg/dL (ref 70–99)
Potassium: 4 mmol/L (ref 3.5–5.1)
Sodium: 134 mmol/L — ABNORMAL LOW (ref 135–145)

## 2022-09-08 LAB — CBC
HCT: 26.1 % — ABNORMAL LOW (ref 39.0–52.0)
Hemoglobin: 8.4 g/dL — ABNORMAL LOW (ref 13.0–17.0)
MCH: 31.9 pg (ref 26.0–34.0)
MCHC: 32.2 g/dL (ref 30.0–36.0)
MCV: 99.2 fL (ref 80.0–100.0)
Platelets: 54 10*3/uL — ABNORMAL LOW (ref 150–400)
RBC: 2.63 MIL/uL — ABNORMAL LOW (ref 4.22–5.81)
RDW: 15.7 % — ABNORMAL HIGH (ref 11.5–15.5)
WBC: 2.2 10*3/uL — ABNORMAL LOW (ref 4.0–10.5)
nRBC: 0 % (ref 0.0–0.2)

## 2022-09-08 LAB — HEMOGLOBIN A1C
Hgb A1c MFr Bld: 5.8 % — ABNORMAL HIGH (ref 4.8–5.6)
Mean Plasma Glucose: 120 mg/dL

## 2022-09-08 LAB — GLUCOSE, CAPILLARY
Glucose-Capillary: 110 mg/dL — ABNORMAL HIGH (ref 70–99)
Glucose-Capillary: 157 mg/dL — ABNORMAL HIGH (ref 70–99)
Glucose-Capillary: 57 mg/dL — ABNORMAL LOW (ref 70–99)
Glucose-Capillary: 83 mg/dL (ref 70–99)
Glucose-Capillary: 85 mg/dL (ref 70–99)
Glucose-Capillary: 85 mg/dL (ref 70–99)

## 2022-09-08 LAB — MAGNESIUM: Magnesium: 1.9 mg/dL (ref 1.7–2.4)

## 2022-09-08 MED ORDER — BUTALBITAL-APAP-CAFFEINE 50-325-40 MG PO TABS
1.0000 | ORAL_TABLET | ORAL | Status: DC | PRN
  Administered 2022-09-08 (×2): 1 via ORAL
  Filled 2022-09-08 (×2): qty 1

## 2022-09-08 NOTE — Care Management Obs Status (Signed)
[place] NOTIFICATION   Patient Details  Name: [name] MRN: [id number] Date of Birth: 03-12-1966   Medicare Observation Status Notification Given:  Yes    Tommy Medal 09/08/2022, 10:35 AM

## 2022-09-08 NOTE — Progress Notes (Signed)
PROGRESS NOTE    [name]  MBE:[id number] DOB: 08-23-1966 DOA: 09/06/2022 PCP: [name], MD   Brief Narrative:    [name] is a 56 y.o. 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  Patient reports 4 day onset of intermittent black stool in his colostomy bag and increased abdominal pain.  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  Plan is to continue clear liquid diet and close monitoring of hemoglobin and hematocrit for now with PPI twice daily.  GI following.  Assessment & Plan:   Principal Problem:   GI bleed Active Problems:   DM type 2 (diabetes mellitus, type 2) (HCC)   Iron deficiency anemia   Pancytopenia (HCC)   AKI (acute kidney injury) (HCC)   Hypoalbuminemia due to protein-calorie malnutrition (HCC)   History of DVT (deep vein thrombosis)   History of pulmonary embolism  Assessment and Plan:   Acute blood loss anemia secondary to GI bleed Status post EGD 12/5 with APC Continue monitor a.m. CBC Hemoccult was positive Clear liquid diet--advance to soft diet on 12/6 Continue IV Protonix 40 mg twice daily Resume home Eliquis per GI hopefully on 12/7   Chronic pancytopenia Iron deficiency anemia Patient follows with [facility] center [facility] when he gets an iron infusion sometimes He takes ferrous sulfate   Acute kidney injury-improving Creatinine 1.44 (baseline creatinine at 0.7-1.1) Renally adjust medications, avoid nephrotoxic agents/dehydration/hypotension Continue to monitor repeat labs   Hypoalbuminemia possibly secondary to moderate protein  calorie malnutrition Albumin 2.1, protein supplements were provided   Class II obesity (BMI 35.07) Diet and lifestyle modification   Type 2 diabetes mellitus-controlled Continue ISS and hypoglycemia protocol   History of DVTs, PE Eliquis to be held at this time, resume per GI, hopefully 12/7    DVT prophylaxis: SCDs Code Status: Full Family Communication: None at bedside 12/6 Disposition Plan: Return home with home health PT once stable hopefully on 12/7  Consultants:  GI  Procedures:  EGD with APC 12/5  Antimicrobials:  None   Subjective: Wanting to advance diet today.  No abd pain.  Having headache.   Objective: Vitals:   09/07/22 2101 09/08/22 0011 09/08/22 0415 09/08/22 0741  BP: (!) 106/55 (!) 115/55 (!) 99/50   Pulse: 71 82 76 77  Resp: 16 18 16 18   Temp: 97.9 F (36.6 C) 98.7 F (37.1 C) 99 F (37.2 C)   TempSrc:      SpO2: 98% 93% 94% 94%  Weight:      Height:        Intake/Output Summary (Last 24 hours) at 09/08/2022 1333 Last data filed at 09/08/2022 0600 Gross per 24 hour  Intake 1419.17 ml  Output 1550 ml  Net -130.83 ml   Filed Weights   09/07/22 0908  Weight: 101.1 kg    Examination:  General exam: Appears calm and comfortable  Respiratory system: Clear to auscultation. Respiratory effort normal. Cardiovascular system: normal S1 & S2 sounds heard.  Gastrointestinal system: Abdomen is soft, colostomy with pink stump and dark stool noted Central nervous system: Alert and awake Extremities: No edema, no clubbing.  Skin: No significant lesions noted Psychiatry: normal affect.  Data Reviewed: I have personally reviewed following labs and imaging studies  CBC: Recent Labs  Lab 09/06/22 1554 09/07/22 0511 09/08/22 0705  WBC 2.6* 2.9* 2.2*  NEUTROABS 1.3*  --   --   HGB 10.4* 9.6* 8.4*  HCT 32.6* 29.4* 26.1*  MCV 100.9* 100.3* 99.2  PLT 47* 65* 54*   Basic Metabolic Panel: Recent Labs  Lab 09/06/22 1554 09/07/22 0511  09/08/22 0551  NA 133* 137 134*  K 4.1 3.8 4.0  CL 100 104 104  CO2 26 30 27   GLUCOSE 132* 73 72  BUN 19 15 11   CREATININE 1.44* 1.20 0.94  CALCIUM 8.1* 8.6* 7.9*  MG  --  2.1 1.9  PHOS  --  3.8  --    GFR: Estimated Creatinine Clearance: 96.5 mL/min (by C-G formula based on SCr of 0.94 mg/dL). Liver Function Tests: Recent Labs  Lab 09/06/22 1554 09/07/22 0511  AST 53* 47*  ALT 22 19  ALKPHOS 112 98  BILITOT 0.8 0.8  PROT 7.1 6.6  ALBUMIN 2.1* 1.9*   No results for input(s): "LIPASE", "AMYLASE" in the last 168 hours. No results for input(s): "AMMONIA" in the last 168 hours. Coagulation Profile: Recent Labs  Lab 09/06/22 2210  INR 1.6*   Cardiac Enzymes: No results for input(s): "CKTOTAL", "CKMB", "CKMBINDEX", "TROPONINI" in the last 168 hours. BNP (last 3 results) No results for input(s): "PROBNP" in the last 8760 hours. HbA1C: Recent Labs    09/06/22 1704  HGBA1C 5.8*   CBG: Recent Labs  Lab 09/07/22 2104 09/08/22 0012 09/08/22 0417 09/08/22 0743 09/08/22 1125  GLUCAP 87 57* 85 83 110*   Lipid Profile: No results for input(s): "CHOL", "HDL", "LDLCALC", "TRIG", "CHOLHDL", "LDLDIRECT" in the last 72 hours. Thyroid Function Tests: No results for input(s): "TSH", "T4TOTAL", "FREET4", "T3FREE", "THYROIDAB" in the last 72 hours. Anemia Panel: No results for input(s): "VITAMINB12", "FOLATE", "FERRITIN", "TIBC", "IRON", "RETICCTPCT" in the last 72 hours. Sepsis Labs: No results for input(s): "PROCALCITON", "LATICACIDVEN" in the last 168 hours.  Recent Results (from the past 240 hour(s))  Resp panel by RT-PCR (RSV, Flu A&B, Covid) Anterior Nasal Swab     Status: None   Collection Time: 09/06/22  5:33 PM   Specimen: Anterior Nasal Swab  Result Value Ref Range Status   SARS Coronavirus 2 by RT PCR NEGATIVE NEGATIVE Final    Comment: (NOTE) SARS-CoV-2 target nucleic acids are NOT DETECTED.  The SARS-CoV-2 RNA is generally detectable in upper  respiratory specimens during the acute phase of infection. The lowest concentration of SARS-CoV-2 viral copies this assay can detect is 138 copies/mL. A negative result does not preclude SARS-Cov-2 infection and should not be used as the sole basis for treatment or other patient management decisions. A negative result may occur with  improper specimen collection/handling, submission of specimen other than nasopharyngeal swab, presence of viral mutation(s) within the areas targeted by this assay, and inadequate number of viral copies(<138 copies/mL). A negative result must be combined with clinical observations, patient history, and epidemiological information. The expected result is Negative.  Fact Sheet for Patients:  EntrepreneurPulse.com.au  Fact Sheet for Healthcare Providers:  IncredibleEmployment.be  This test is no t yet approved or cleared by the [place] FDA and  has been authorized for detection and/or diagnosis of SARS-CoV-2 by FDA under an Emergency Use Authorization (EUA). This EUA will remain  in effect (meaning this test can be used) for the duration  of the COVID-19 declaration under Section 564(b)(1) of the Act, 21 U.S.C.section 360bbb-3(b)(1), unless the authorization is terminated  or revoked sooner.       Influenza A by PCR NEGATIVE NEGATIVE Final   Influenza B by PCR NEGATIVE NEGATIVE Final    Comment: (NOTE) The Xpert Xpress SARS-CoV-2/FLU/RSV plus assay is intended as an aid in the diagnosis of influenza from Nasopharyngeal swab specimens and should not be used as a sole basis for treatment. Nasal washings and aspirates are unacceptable for Xpert Xpress SARS-CoV-2/FLU/RSV testing.  Fact Sheet for Patients: EntrepreneurPulse.com.au  Fact Sheet for Healthcare Providers: IncredibleEmployment.be  This test is not yet approved or cleared by the [place] FDA and has been  authorized for detection and/or diagnosis of SARS-CoV-2 by FDA under an Emergency Use Authorization (EUA). This EUA will remain in effect (meaning this test can be used) for the duration of the COVID-19 declaration under Section 564(b)(1) of the Act, 21 U.S.C. section 360bbb-3(b)(1), unless the authorization is terminated or revoked.     Resp Syncytial Virus by PCR NEGATIVE NEGATIVE Final    Comment: (NOTE) Fact Sheet for Patients: EntrepreneurPulse.com.au  Fact Sheet for Healthcare Providers: IncredibleEmployment.be  This test is not yet approved or cleared by the [place] FDA and has been authorized for detection and/or diagnosis of SARS-CoV-2 by FDA under an Emergency Use Authorization (EUA). This EUA will remain in effect (meaning this test can be used) for the duration of the COVID-19 declaration under Section 564(b)(1) of the Act, 21 U.S.C. section 360bbb-3(b)(1), unless the authorization is terminated or revoked.  Performed at [facility], [address] [place], [place] [address]     Radiology Studies: CT ABDOMEN PELVIS W CONTRAST  Result Date: 09/06/2022 CLINICAL DATA:  Acute abdominal pain.  Bowel resection 2 years ago. EXAM: CT ABDOMEN AND PELVIS WITH CONTRAST TECHNIQUE: Multidetector CT imaging of the abdomen and pelvis was performed using the standard protocol following bolus administration of intravenous contrast. RADIATION DOSE REDUCTION: This exam was performed according to the departmental dose-optimization program which includes automated exposure control, adjustment of the mA and/or kV according to patient size and/or use of iterative reconstruction technique. CONTRAST:  140m OMNIPAQUE IOHEXOL 300 MG/ML  SOLN COMPARISON:  CT abdomen 11/11/2021 FINDINGS: Lower chest: No acute abnormality. Hepatobiliary: No focal liver abnormality is seen. No gallstones, gallbladder wall thickening, or biliary dilatation. Pancreas: Unremarkable.  No pancreatic ductal dilatation or surrounding inflammatory changes. Spleen: Borderline enlarged, unchanged. Adrenals/Urinary Tract: Bladder is distended. There is no hydronephrosis or perinephric fat stranding. There is left renal cortical scarring. Adrenal glands are within normal limits. Stomach/Bowel: Patient is status post colectomy. Right lower quadrant ostomy is present. Mild fat stranding and small lymph nodes are seen in the anterior abdominal wall at the level of the ostomy. No focal fluid collection. No bowel obstruction, pneumatosis or free air. The stomach is within normal limits. Vascular/Lymphatic: Aorta and IVC are normal in size. 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 No enlarged lymph nodes are seen. Reproductive: Prostate gland is small or absent. Other: No ascites. No focal abdominal wall hernia. Midline abdominal scar is present. Musculoskeletal: There is a right hip arthroplasty in anatomic alignment. IMPRESSION: 1. Status post colectomy with right lower quadrant ostomy. Mild fat stranding and small lymph nodes in the anterior abdominal wall at the level of the ostomy. Correlate clinically for infection. No focal fluid collection. 2. No bowel obstruction or free air. 3. Distended bladder. 4. Stable gastrohepatic varices. Electronically  Signed   By: Ronney Asters M.D.   On: 09/06/2022 19:30    Scheduled Meds:  atorvastatin  80 mg Oral Daily   budesonide (PULMICORT) nebulizer solution  0.25 mg Nebulization BID   feeding supplement (GLUCERNA SHAKE)  237 mL Oral TID BM   insulin aspart  0-15 Units Subcutaneous Q4H   pantoprazole (PROTONIX) IV  40 mg Intravenous Q12H   sertraline  50 mg Oral Daily   umeclidinium bromide  1 puff Inhalation Daily   Continuous Infusions:   LOS: 0 days    Time spent: 35 minutes  Belem Hintze, MD [facility]  If 7PM-7AM, please contact night-coverage [email or web address] 09/08/2022, 1:33 PM

## 2022-09-08 NOTE — Progress Notes (Signed)
Subjective: Patient doing okay this morning. Denies abdominal pain, nausea or vomiting. Stool in ostomy bag this morning without BRB or melena, states stool consistency is fairly normal for him.  Denies SOB, dizziness or fatigue. States he is feeling hungry today.   Objective: Vital signs in last 24 hours: Temp:  [97.6 F (36.4 C)-99 F (37.2 C)] 99 F (37.2 C) (12/06 0415) Pulse Rate:  [70-84] 77 (12/06 0741) Resp:  [10-18] 18 (12/06 0741) BP: (99-133)/(36-80) 99/50 (12/06 0415) SpO2:  [93 %-100 %] 94 % (12/06 0741) Last BM Date : 09/07/22 General:   Alert and oriented, pleasant Head:  Normocephalic and atraumatic. Eyes:  No icterus, sclera clear. Conjuctiva pink.  Mouth:  Without lesions, mucosa pink and moist.  Heart:  S1, S2 present, no murmurs noted.  Lungs: Clear to auscultation bilaterally, without wheezing, rales, or rhonchi.  Abdomen:  Bowel sounds present, soft, non-tender, non-distended. Ostomy present to RLQ with brown, liquid consistency stool. No HSM or hernias noted. No rebound or guarding. No masses appreciated  Msk:  Symmetrical without gross deformities. Normal posture. Pulses:  Normal pulses noted. Extremities:  Without clubbing or edema. Neurologic:  Alert and  oriented x4;  grossly normal neurologically. Skin:  Warm and dry, intact without significant lesions.  Psych:  Alert and cooperative. Normal mood and affect.  Intake/Output from previous day: 12/05 0701 - 12/06 0700 In: 1419.2 [P.O.:840; I.V.:379.2; IV Piggyback:200] Out: 1093 [Urine:1325; Stool:350]  Lab Results: Recent Labs    09/06/22 1554 09/07/22 0511 09/08/22 0705  WBC 2.6* 2.9* 2.2*  HGB 10.4* 9.6* 8.4*  HCT 32.6* 29.4* 26.1*  PLT 47* 65* 54*   BMET Recent Labs    09/06/22 1554 09/07/22 0511 09/08/22 0551  NA 133* 137 134*  K 4.1 3.8 4.0  CL 100 104 104  CO2 26 30 27   GLUCOSE 132* 73 72  BUN 19 15 11   CREATININE 1.44* 1.20 0.94  CALCIUM 8.1* 8.6* 7.9*   LFT Recent Labs     09/06/22 1554 09/07/22 0511  PROT 7.1 6.6  ALBUMIN 2.1* 1.9*  AST 53* 47*  ALT 22 19  ALKPHOS 112 98  BILITOT 0.8 0.8   PT/INR Recent Labs    09/06/22 2210  LABPROT 18.6*  INR 1.6*   Studies/Results: CT ABDOMEN PELVIS W CONTRAST  Result Date: 09/06/2022 CLINICAL DATA:  Acute abdominal pain.  Bowel resection 2 years ago. EXAM: CT ABDOMEN AND PELVIS WITH CONTRAST TECHNIQUE: Multidetector CT imaging of the abdomen and pelvis was performed using the standard protocol following bolus administration of intravenous contrast. RADIATION DOSE REDUCTION: This exam was performed according to the departmental dose-optimization program which includes automated exposure control, adjustment of the mA and/or kV according to patient size and/or use of iterative reconstruction technique. CONTRAST:  190m OMNIPAQUE IOHEXOL 300 MG/ML  SOLN COMPARISON:  CT abdomen 11/11/2021 FINDINGS: Lower chest: No acute abnormality. Hepatobiliary: No focal liver abnormality is seen. No gallstones, gallbladder wall thickening, or biliary dilatation. Pancreas: Unremarkable. No pancreatic ductal dilatation or surrounding inflammatory changes. Spleen: Borderline enlarged, unchanged. Adrenals/Urinary Tract: Bladder is distended. There is no hydronephrosis or perinephric fat stranding. There is left renal cortical scarring. Adrenal glands are within normal limits. Stomach/Bowel: Patient is status post colectomy. Right lower quadrant ostomy is present. Mild fat stranding and small lymph nodes are seen in the anterior abdominal wall at the level of the ostomy. No focal fluid collection. No bowel obstruction, pneumatosis or free air. The stomach is within normal limits. Vascular/Lymphatic: Aorta and  IVC are normal in size. 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 No enlarged lymph nodes are seen. Reproductive: Prostate gland is small or absent. Other: No ascites. No focal abdominal wall hernia. Midline abdominal scar  is present. Musculoskeletal: There is a right hip arthroplasty in anatomic alignment. IMPRESSION: 1. Status post colectomy with right lower quadrant ostomy. Mild fat stranding and small lymph nodes in the anterior abdominal wall at the level of the ostomy. Correlate clinically for infection. No focal fluid collection. 2. No bowel obstruction or free air. 3. Distended bladder. 4. Stable gastrohepatic varices. Electronically Signed   By: Ronney Asters M.D.   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 Presenting this admission with weakness, reports of melena, chronic pancytopenia. GI consulted due to GI bleed.   Heme positive stool, melena and worsening anemia:  chronic pancytopenia, followed by Hematology. Hgb 10.4 on admission. Has continued to trend down, 8.4 this morning, likely delayed effect from previous bleeding.  Melena for one week prior to admission, but stool brown currently. presence of gastric varices on CT; this was reviewed with Radiology. No cirrhosis seen on imaging. Spleen borderline enlarged. Started on Octreotide, prophylactic antibiotics, due to concern for esophageal varices. EGD yesterday with 1cm hh, Erythematous mucosa in the gastric fundus. Treated w/ (APC).GAVE w/ bleeding. Treated w/ (APC). Erythematous duodenopathy. Octreotide and rocephin stopped. Recommend PPI BID. He denies abdominal pain, nausea or vomiting. Is tolerating clears okay and is hungry. Will advance to soft diet today.   IBD: on Cimzia, continue with follow up at [facility], complicated prior history  Pancytopenia: continue to follow with hematology, consider [place] elastography as outpatient  Mildly elevated AST: chronic, needs follow  up as outpatient for further workup/serologies.   Plan: PPI BID Trend h&h daily Advance to soft diet today  Continue to follow outpatient with [facility] Continue to follow with hematology Further workup of elevated AST/US elastography as outpatient   LOS: 0 days    09/08/2022, 9:23 AM   Mellina Benison L. Alver Sorrow, MSN, APRN, AGNP-C Adult-Gerontology Nurse Practitioner [facility]

## 2022-09-08 NOTE — Progress Notes (Signed)
[employer] would like a call when patient discharges. [phone] or [phone]. This is the company in charge of education, outreach, and advocacy for people with disabilities.

## 2022-09-09 DIAGNOSIS — Z86718 Personal history of other venous thrombosis and embolism: Secondary | ICD-10-CM | POA: Diagnosis not present

## 2022-09-09 DIAGNOSIS — K922 Gastrointestinal hemorrhage, unspecified: Secondary | ICD-10-CM | POA: Diagnosis not present

## 2022-09-09 DIAGNOSIS — D509 Iron deficiency anemia, unspecified: Secondary | ICD-10-CM | POA: Diagnosis not present

## 2022-09-09 DIAGNOSIS — D61818 Other pancytopenia: Secondary | ICD-10-CM | POA: Diagnosis not present

## 2022-09-09 LAB — GLUCOSE, CAPILLARY
Glucose-Capillary: 123 mg/dL — ABNORMAL HIGH (ref 70–99)
Glucose-Capillary: 149 mg/dL — ABNORMAL HIGH (ref 70–99)
Glucose-Capillary: 72 mg/dL (ref 70–99)
Glucose-Capillary: 74 mg/dL (ref 70–99)
Glucose-Capillary: 75 mg/dL (ref 70–99)

## 2022-09-09 LAB — CBC
HCT: 27 % — ABNORMAL LOW (ref 39.0–52.0)
Hemoglobin: 8.7 g/dL — ABNORMAL LOW (ref 13.0–17.0)
MCH: 32.3 pg (ref 26.0–34.0)
MCHC: 32.2 g/dL (ref 30.0–36.0)
MCV: 100.4 fL — ABNORMAL HIGH (ref 80.0–100.0)
Platelets: 54 10*3/uL — ABNORMAL LOW (ref 150–400)
RBC: 2.69 MIL/uL — ABNORMAL LOW (ref 4.22–5.81)
RDW: 15.3 % (ref 11.5–15.5)
WBC: 2.5 10*3/uL — ABNORMAL LOW (ref 4.0–10.5)
nRBC: 0 % (ref 0.0–0.2)

## 2022-09-09 LAB — BASIC METABOLIC PANEL
Anion gap: 2 — ABNORMAL LOW (ref 5–15)
BUN: 8 mg/dL (ref 6–20)
CO2: 30 mmol/L (ref 22–32)
Calcium: 7.7 mg/dL — ABNORMAL LOW (ref 8.9–10.3)
Chloride: 105 mmol/L (ref 98–111)
Creatinine, Ser: 0.93 mg/dL (ref 0.61–1.24)
GFR, Estimated: >60 mL/min (ref >60)
Glucose, Bld: 72 mg/dL (ref 70–99)
Potassium: 4 mmol/L (ref 3.5–5.1)
Sodium: 137 mmol/L (ref 135–145)

## 2022-09-09 LAB — MAGNESIUM: Magnesium: 1.9 mg/dL (ref 1.7–2.4)

## 2022-09-09 MED ORDER — METOPROLOL SUCCINATE ER 25 MG PO TB24: 25.0000 mg | ORAL_TABLET | Freq: Every day | ORAL | 2 refills | Status: DC

## 2022-09-09 MED ORDER — ELIQUIS 5 MG PO TABS: 5.0000 mg | ORAL_TABLET | Freq: Two times a day (BID) | ORAL | Status: DC

## 2022-09-09 MED ORDER — MOMETASONE FURO-FORMOTEROL FUM 200-5 MCG/ACT IN AERO
2.0000 | INHALATION_SPRAY | Freq: Two times a day (BID) | RESPIRATORY_TRACT | Status: DC
  Filled 2022-09-09: qty 8.8

## 2022-09-09 MED ORDER — TORSEMIDE 20 MG PO TABS: 20.0000 mg | ORAL_TABLET | Freq: Every day | ORAL | 3 refills | Status: DC

## 2022-09-09 NOTE — Addendum Note (Signed)
Addendum  created 09/09/22 1043 by Karna Dupes, CRNA   Intraprocedure Staff edited

## 2022-09-09 NOTE — Discharge Summary (Signed)
Physician Discharge Summary  [name] UQJ:[id number] DOB: 14-May-1966 DOA: 09/06/2022  PCP: [name], MD  Admit date: 09/06/2022 Discharge date: 09/09/2022  Recommendations for Outpatient Follow-up:  Follow up with PCP in 1 weeks Follow up with [facility] in 3-4 weeks Follow up with heme/onc on 09/21/22 as scheduled  Please obtain CBC in 1-2 weeks   Home Health: outpatient physical therapy   Discharge Condition: STABLE   CODE STATUS: FULL DIET: Soft foods x 1 week then resume prior home diet    Brief Hospitalization Summary: Please see all hospital notes, images, labs for full details of the hospitalization. ADMISSION HPI:  56 y.o. 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  Patient reports 4 day onset of intermittent black stool in his colostomy bag and increased abdominal pain.  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  Plan is to continue clear liquid diet and close monitoring of hemoglobin and hematocrit for now with PPI twice daily.  GI following.  HOSPITAL COURSE BY PROBLEM    Acute blood loss anemia secondary to GI bleed Status post EGD 12/5 with APC Hg stable today at 8.7 EGD 12/5 with 1cm hh, Erythematous mucosa in the gastric fundus. Treated w/ (APC).GAVE w/ bleeding. Treated w/ (APC). Erythematous duodenopathy. Octreotide and rocephin stopped. GI team recommend PPI BID.  Clear liquid diet--advanced to soft diet on 12/6 and tolerating well Continue Protonix 40 mg twice daily Resume home Eliquis with bleeding precautions   Chronic pancytopenia Iron deficiency  anemia Patient follows with cancer center at [facility] when he gets an iron infusion sometimes He takes ferrous sulfate Follow up with hem/onc outpatient    Acute kidney injury-improving Creatinine 1.44 (baseline creatinine at 0.7-1.1) Renally adjust medications, avoid nephrotoxic agents/dehydration/hypotension Continue to monitor repeat labs   Hypoalbuminemia possibly secondary to moderate protein calorie malnutrition Albumin 2.1, protein supplements were provided   Class II obesity (BMI 35.07) Diet and lifestyle modification   Type 2 diabetes mellitus-controlled Continue ISS and hypoglycemia protocol Resume home treatment program    History of DVTs, PE Restarted apixaban with bleeding precautions     Discharge Diagnoses:  Principal Problem:   GI bleed Active Problems:   DM type 2 (diabetes mellitus, type 2) (HCC)   Iron deficiency anemia   Pancytopenia (HCC)   AKI (acute kidney injury) ([place])   Hypoalbuminemia due to protein-calorie malnutrition ([place])   History of DVT (deep vein thrombosis)   History of pulmonary embolism   Discharge Instructions: Discharge Instructions     Ambulatory referral to Physical Therapy   Complete by: As directed       Allergies as of 09/09/2022       Reactions   Penicillins Anaphylaxis   Bactrim [sulfamethoxazole-trimethoprim] Other (See Comments)   ABD CRAMPS AND DIARRHEA Welps/itch   Oxycodone-acetaminophen Other (See Comments)   Other reaction(s): Swelling   Remicade [infliximab] Other (See Comments)   COULDN'T BREATHE   Aspirin    REACTION: unknown reaction   Codeine    REACTION: unknown reaction   Ibuprofen    REACTION: unknown reaction   Macrobid [nitrofurantoin] Itching   Tramadol Nausea Only  Other reaction(s): Nausea   Tramadol Hcl    REACTION: unknown reaction   Vicodin [hydrocodone-acetaminophen] Nausea Only        Medication List     STOP taking these medications    cefdinir 300 MG capsule Commonly known  as: OMNICEF   dicyclomine 10 MG capsule Commonly known as: BENTYL   docusate sodium 100 MG capsule Commonly known as: COLACE   Dulera 200-5 MCG/ACT Aero Generic drug: mometasone-formoterol   meloxicam 7.5 MG tablet Commonly known as: MOBIC   mirtazapine 15 MG tablet Commonly known as: REMERON   oxyCODONE-acetaminophen 5-325 MG tablet Commonly known as: Percocet   Spiriva Respimat 2.5 MCG/ACT Aers Generic drug: Tiotropium Bromide Monohydrate       TAKE these medications    albuterol (2.5 MG/3ML) 0.083% nebulizer solution Commonly known as: PROVENTIL Take 2.5 mg by nebulization every 6 (six) hours as needed for wheezing or shortness of breath.   albuterol 108 (90 Base) MCG/ACT inhaler Commonly known as: VENTOLIN HFA Inhale 2 puffs into the lungs every 6 (six) hours as needed for wheezing or shortness of breath.   atorvastatin 80 MG tablet Commonly known as: LIPITOR Take 80 mg by mouth daily.   benztropine 0.5 MG tablet Commonly known as: COGENTIN Take 0.5 mg by mouth daily.   Calcium 600/Vitamin D3 600-20 MG-MCG Tabs Generic drug: Calcium Carb-Cholecalciferol TAKE (1) TABLET BY MOUTH THREE TIMES DAILY AFTER MEALS.   cyanocobalamin 1000 MCG tablet Take 1,000 mcg by mouth daily.   divalproex 250 MG DR tablet Commonly known as: DEPAKOTE Take 250 mg by mouth daily.   divalproex 500 MG DR tablet Commonly known as: DEPAKOTE Take 1,000 mg by mouth at bedtime.   Eliquis 5 MG Tabs tablet Generic drug: apixaban Take 1 tablet (5 mg total) by mouth 2 (two) times daily.   ferrous sulfate 325 (65 FE) MG EC tablet Take 1 tablet (325 mg total) by mouth daily with breakfast.   folic acid 1 MG tablet Commonly known as: FOLVITE Take 1 tablet by mouth daily.   gabapentin 300 MG capsule Commonly known as: NEURONTIN Take 900 mg by mouth 3 (three) times daily.   hydroxychloroquine 200 MG tablet Commonly known as: PLAQUENIL Take 1 tablet by mouth 2 (two) times  daily.   metoprolol succinate 25 MG 24 hr tablet Commonly known as: TOPROL-XL Take 1 tablet (25 mg total) by mouth daily. Take with or immediately following a meal. Start taking on: September 10, 2022 What changed: when to take this   nitroGLYCERIN 0.4 MG SL tablet Commonly known as: NITROSTAT Place 1 tablet (0.4 mg total) under the tongue every 5 (five) minutes as needed for chest pain.   pantoprazole 40 MG tablet Commonly known as: PROTONIX TAKE (1) TABLET BY MOUTH TWICE A DAY BEFORE MEALS. (BREAKFAST AND SUPPER)   risperiDONE 1 MG tablet Commonly known as: RISPERDAL Take 1 mg by mouth at bedtime.   risperiDONE 3 MG tablet Commonly known as: RISPERDAL Take 6 mg by mouth at bedtime.   sertraline 50 MG tablet Commonly known as: ZOLOFT Take 50 mg by mouth daily.   tamsulosin 0.4 MG Caps capsule Commonly known as: FLOMAX Take 0.4 mg by mouth daily.   torsemide 20 MG tablet Commonly known as: DEMADEX Take 1 tablet (20 mg total) by mouth daily. Start taking on: September 11, 2022 What changed:  when to take this These instructions start on September 11, 2022. If you are unsure what to do until then, ask your  doctor or other care provider.   traZODone 100 MG tablet Commonly known as: DESYREL Take 100 mg by mouth at bedtime.   [place] USE AS DIRECTED        Follow-up Information     [name], MD. Schedule an appointment as soon as possible for a visit in 1 week(s).   Specialty: Family Medicine Why: Hospital Follow Up Contact information: [address] [place] [address] [phone]         [facility]. Schedule an appointment as soon as possible for a visit in 1 month(s).   Why: Hospital Follow Up               Allergies  Allergen Reactions   Penicillins Anaphylaxis   Bactrim [Sulfamethoxazole-Trimethoprim] Other (See Comments)    ABD CRAMPS AND DIARRHEA Welps/itch   Oxycodone-Acetaminophen Other (See Comments)     Other reaction(s): Swelling   Remicade [Infliximab] Other (See Comments)    COULDN'T BREATHE   Aspirin     REACTION: unknown reaction   Codeine     REACTION: unknown reaction   Ibuprofen     REACTION: unknown reaction   Macrobid [Nitrofurantoin] Itching   Tramadol Nausea Only    Other reaction(s): Nausea   Tramadol Hcl     REACTION: unknown reaction   Vicodin [Hydrocodone-Acetaminophen] Nausea Only   Allergies as of 09/09/2022       Reactions   Penicillins Anaphylaxis   Bactrim [sulfamethoxazole-trimethoprim] Other (See Comments)   ABD CRAMPS AND DIARRHEA Welps/itch   Oxycodone-acetaminophen Other (See Comments)   Other reaction(s): Swelling   Remicade [infliximab] Other (See Comments)   COULDN'T BREATHE   Aspirin    REACTION: unknown reaction   Codeine    REACTION: unknown reaction   Ibuprofen    REACTION: unknown reaction   Macrobid [nitrofurantoin] Itching   Tramadol Nausea Only   Other reaction(s): Nausea   Tramadol Hcl    REACTION: unknown reaction   Vicodin [hydrocodone-acetaminophen] Nausea Only        Medication List     STOP taking these medications    cefdinir 300 MG capsule Commonly known as: OMNICEF   dicyclomine 10 MG capsule Commonly known as: BENTYL   docusate sodium 100 MG capsule Commonly known as: COLACE   Dulera 200-5 MCG/ACT Aero Generic drug: mometasone-formoterol   meloxicam 7.5 MG tablet Commonly known as: MOBIC   mirtazapine 15 MG tablet Commonly known as: REMERON   oxyCODONE-acetaminophen 5-325 MG tablet Commonly known as: Percocet   Spiriva Respimat 2.5 MCG/ACT Aers Generic drug: Tiotropium Bromide Monohydrate       TAKE these medications    albuterol (2.5 MG/3ML) 0.083% nebulizer solution Commonly known as: PROVENTIL Take 2.5 mg by nebulization every 6 (six) hours as needed for wheezing or shortness of breath.   albuterol 108 (90 Base) MCG/ACT inhaler Commonly known as: VENTOLIN HFA Inhale 2 puffs into the  lungs every 6 (six) hours as needed for wheezing or shortness of breath.   atorvastatin 80 MG tablet Commonly known as: LIPITOR Take 80 mg by mouth daily.   benztropine 0.5 MG tablet Commonly known as: COGENTIN Take 0.5 mg by mouth daily.   Calcium 600/Vitamin D3 600-20 MG-MCG Tabs Generic drug: Calcium Carb-Cholecalciferol TAKE (1) TABLET BY MOUTH THREE TIMES DAILY AFTER MEALS.   cyanocobalamin 1000 MCG tablet Take 1,000 mcg by mouth daily.   divalproex 250 MG DR tablet Commonly known as: DEPAKOTE Take 250 mg by mouth daily.  divalproex 500 MG DR tablet Commonly known as: DEPAKOTE Take 1,000 mg by mouth at bedtime.   Eliquis 5 MG Tabs tablet Generic drug: apixaban Take 1 tablet (5 mg total) by mouth 2 (two) times daily.   ferrous sulfate 325 (65 FE) MG EC tablet Take 1 tablet (325 mg total) by mouth daily with breakfast.   folic acid 1 MG tablet Commonly known as: FOLVITE Take 1 tablet by mouth daily.   gabapentin 300 MG capsule Commonly known as: NEURONTIN Take 900 mg by mouth 3 (three) times daily.   hydroxychloroquine 200 MG tablet Commonly known as: PLAQUENIL Take 1 tablet by mouth 2 (two) times daily.   metoprolol succinate 25 MG 24 hr tablet Commonly known as: TOPROL-XL Take 1 tablet (25 mg total) by mouth daily. Take with or immediately following a meal. Start taking on: September 10, 2022 What changed: when to take this   nitroGLYCERIN 0.4 MG SL tablet Commonly known as: NITROSTAT Place 1 tablet (0.4 mg total) under the tongue every 5 (five) minutes as needed for chest pain.   pantoprazole 40 MG tablet Commonly known as: PROTONIX TAKE (1) TABLET BY MOUTH TWICE A DAY BEFORE MEALS. (BREAKFAST AND SUPPER)   risperiDONE 1 MG tablet Commonly known as: RISPERDAL Take 1 mg by mouth at bedtime.   risperiDONE 3 MG tablet Commonly known as: RISPERDAL Take 6 mg by mouth at bedtime.   sertraline 50 MG tablet Commonly known as: ZOLOFT Take 50 mg by mouth  daily.   tamsulosin 0.4 MG Caps capsule Commonly known as: FLOMAX Take 0.4 mg by mouth daily.   torsemide 20 MG tablet Commonly known as: DEMADEX Take 1 tablet (20 mg total) by mouth daily. Start taking on: September 11, 2022 What changed:  when to take this These instructions start on September 11, 2022. If you are unsure what to do until then, ask your doctor or other care provider.   traZODone 100 MG tablet Commonly known as: DESYREL Take 100 mg by mouth at bedtime.   [employer] Misc USE AS DIRECTED        Procedures/Studies: CT ABDOMEN PELVIS W CONTRAST  Result Date: 09/06/2022 CLINICAL DATA:  Acute abdominal pain.  Bowel resection 2 years ago. EXAM: CT ABDOMEN AND PELVIS WITH CONTRAST TECHNIQUE: Multidetector CT imaging of the abdomen and pelvis was performed using the standard protocol following bolus administration of intravenous contrast. RADIATION DOSE REDUCTION: This exam was performed according to the departmental dose-optimization program which includes automated exposure control, adjustment of the mA and/or kV according to patient size and/or use of iterative reconstruction technique. CONTRAST:  174m OMNIPAQUE IOHEXOL 300 MG/ML  SOLN COMPARISON:  CT abdomen 11/11/2021 FINDINGS: Lower chest: No acute abnormality. Hepatobiliary: No focal liver abnormality is seen. No gallstones, gallbladder wall thickening, or biliary dilatation. Pancreas: Unremarkable. No pancreatic ductal dilatation or surrounding inflammatory changes. Spleen: Borderline enlarged, unchanged. Adrenals/Urinary Tract: Bladder is distended. There is no hydronephrosis or perinephric fat stranding. There is left renal cortical scarring. Adrenal glands are within normal limits. Stomach/Bowel: Patient is status post colectomy. Right lower quadrant ostomy is present. Mild fat stranding and small lymph nodes are seen in the anterior abdominal wall at the level of the ostomy. No focal fluid collection. No bowel  obstruction, pneumatosis or free air. The stomach is within normal limits. Vascular/Lymphatic: Aorta and IVC are normal in size. 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 No enlarged lymph nodes are seen. Reproductive: Prostate gland is small or  absent. Other: No ascites. No focal abdominal wall hernia. Midline abdominal scar is present. Musculoskeletal: There is a right hip arthroplasty in anatomic alignment. IMPRESSION: 1. Status post colectomy with right lower quadrant ostomy. Mild fat stranding and small lymph nodes in the anterior abdominal wall at the level of the ostomy. Correlate clinically for infection. No focal fluid collection. 2. No bowel obstruction or free air. 3. Distended bladder. 4. Stable gastrohepatic varices. Electronically Signed   By: Ronney Asters M.D.   On: 09/06/2022 19:30     Subjective: Pt reports that he feels well. No specific complaints.  He is agreeable to going home.  He is eating well tolerating diet with no problems.   Discharge Exam: Vitals:   09/09/22 0426 09/09/22 0736  BP: (!) 102/53   Pulse: 71 66  Resp: 18 16  Temp: 98.1 F (36.7 C)   SpO2: 94% 91%   Vitals:   09/08/22 2039 09/08/22 2056 09/09/22 0426 09/09/22 0736  BP: (!) 99/53 (!) 97/56 (!) 102/53   Pulse: 68 71 71 66  Resp: 20 18 18 16   Temp: 98.4 F (36.9 C) 98.6 F (37 C) 98.1 F (36.7 C)   TempSrc: Oral     SpO2: 98% 94% 94% 91%  Weight:      Height:       General: Pt is alert, awake, not in acute distress Cardiovascular: RRR, S1/S2 +, no rubs, no gallops Respiratory: CTA bilaterally, no wheezing, no rhonchi Abdominal: Soft, NT, ND, bowel sounds + Extremities: no edema, no cyanosis   The results of significant diagnostics from this hospitalization (including imaging, microbiology, ancillary and laboratory) are listed below for reference.     Microbiology: Recent Results (from the past 240 hour(s))  Resp panel by RT-PCR (RSV, Flu A&B, Covid) Anterior Nasal Swab      Status: None   Collection Time: 09/06/22  5:33 PM   Specimen: Anterior Nasal Swab  Result Value Ref Range Status   SARS Coronavirus 2 by RT PCR NEGATIVE NEGATIVE Final    Comment: (NOTE) SARS-CoV-2 target nucleic acids are NOT DETECTED.  The SARS-CoV-2 RNA is generally detectable in upper respiratory specimens during the acute phase of infection. The lowest concentration of SARS-CoV-2 viral copies this assay can detect is 138 copies/mL. A negative result does not preclude SARS-Cov-2 infection and should not be used as the sole basis for treatment or other patient management decisions. A negative result may occur with  improper specimen collection/handling, submission of specimen other than nasopharyngeal swab, presence of viral mutation(s) within the areas targeted by this assay, and inadequate number of viral copies(<138 copies/mL). A negative result must be combined with clinical observations, patient history, and epidemiological information. The expected result is Negative.  Fact Sheet for Patients:  EntrepreneurPulse.com.au  Fact Sheet for Healthcare Providers:  IncredibleEmployment.be  This test is no t yet approved or cleared by the [place] FDA and  has been authorized for detection and/or diagnosis of SARS-CoV-2 by FDA under an Emergency Use Authorization (EUA). This EUA will remain  in effect (meaning this test can be used) for the duration of the COVID-19 declaration under Section 564(b)(1) of the Act, 21 U.S.C.section 360bbb-3(b)(1), unless the authorization is terminated  or revoked sooner.       Influenza A by PCR NEGATIVE NEGATIVE Final   Influenza B by PCR NEGATIVE NEGATIVE Final    Comment: (NOTE) The Xpert Xpress SARS-CoV-2/FLU/RSV plus assay is intended as an aid in the diagnosis of influenza from  Nasopharyngeal swab specimens and should not be used as a sole basis for treatment. Nasal washings and aspirates are  unacceptable for Xpert Xpress SARS-CoV-2/FLU/RSV testing.  Fact Sheet for Patients: EntrepreneurPulse.com.au  Fact Sheet for Healthcare Providers: IncredibleEmployment.be  This test is not yet approved or cleared by the [place] FDA and has been authorized for detection and/or diagnosis of SARS-CoV-2 by FDA under an Emergency Use Authorization (EUA). This EUA will remain in effect (meaning this test can be used) for the duration of the COVID-19 declaration under Section 564(b)(1) of the Act, 21 U.S.C. section 360bbb-3(b)(1), unless the authorization is terminated or revoked.     Resp Syncytial Virus by PCR NEGATIVE NEGATIVE Final    Comment: (NOTE) Fact Sheet for Patients: EntrepreneurPulse.com.au  Fact Sheet for Healthcare Providers: IncredibleEmployment.be  This test is not yet approved or cleared by the [place] FDA and has been authorized for detection and/or diagnosis of SARS-CoV-2 by FDA under an Emergency Use Authorization (EUA). This EUA will remain in effect (meaning this test can be used) for the duration of the COVID-19 declaration under Section 564(b)(1) of the Act, 21 U.S.C. section 360bbb-3(b)(1), unless the authorization is terminated or revoked.  Performed at [facility], [address], [place], [place] [address]      Labs: BNP (last 3 results) No results for input(s): "BNP" in the last 8760 hours. Basic Metabolic Panel: Recent Labs  Lab 09/06/22 1554 09/07/22 0511 09/08/22 0551 09/09/22 0609  NA 133* 137 134* 137  K 4.1 3.8 4.0 4.0  CL 100 104 104 105  CO2 26 30 27 30   GLUCOSE 132* 73 72 72  BUN 19 15 11 8   CREATININE 1.44* 1.20 0.94 0.93  CALCIUM 8.1* 8.6* 7.9* 7.7*  MG  --  2.1 1.9 1.9  PHOS  --  3.8  --   --    Liver Function Tests: Recent Labs  Lab 09/06/22 1554 09/07/22 0511  AST 53* 47*  ALT 22 19  ALKPHOS 112 98  BILITOT 0.8 0.8  PROT 7.1 6.6   ALBUMIN 2.1* 1.9*   No results for input(s): "LIPASE", "AMYLASE" in the last 168 hours. No results for input(s): "AMMONIA" in the last 168 hours. CBC: Recent Labs  Lab 09/06/22 1554 09/07/22 0511 09/08/22 0705 09/09/22 0609  WBC 2.6* 2.9* 2.2* 2.5*  NEUTROABS 1.3*  --   --   --   HGB 10.4* 9.6* 8.4* 8.7*  HCT 32.6* 29.4* 26.1* 27.0*  MCV 100.9* 100.3* 99.2 100.4*  PLT 47* 65* 54* 54*   Cardiac Enzymes: No results for input(s): "CKTOTAL", "CKMB", "CKMBINDEX", "TROPONINI" in the last 168 hours. BNP: Invalid input(s): "POCBNP" CBG: Recent Labs  Lab 09/08/22 2036 09/09/22 0000 09/09/22 0428 09/09/22 0730 09/09/22 1107  GLUCAP 85 74 123* 75 149*   D-Dimer No results for input(s): "DDIMER" in the last 72 hours. Hgb A1c Recent Labs    09/06/22 1704  HGBA1C 5.8*   Lipid Profile No results for input(s): "CHOL", "HDL", "LDLCALC", "TRIG", "CHOLHDL", "LDLDIRECT" in the last 72 hours. Thyroid function studies No results for input(s): "TSH", "T4TOTAL", "T3FREE", "THYROIDAB" in the last 72 hours.  Invalid input(s): "FREET3" Anemia work up No results for input(s): "VITAMINB12", "FOLATE", "FERRITIN", "TIBC", "IRON", "RETICCTPCT" in the last 72 hours. Urinalysis    Component Value Date/Time   COLORURINE YELLOW 09/06/2022 2146   APPEARANCEUR CLEAR 09/06/2022 2146   APPEARANCEUR Cloudy (A) 08/25/2022 1417   LABSPEC 1.027 09/06/2022 2146   PHURINE 6.0 09/06/2022 2146  GLUCOSEU NEGATIVE 09/06/2022 2146   HGBUR NEGATIVE 09/06/2022 2146   BILIRUBINUR NEGATIVE 09/06/2022 2146   BILIRUBINUR Negative 08/25/2022 1417   KETONESUR NEGATIVE 09/06/2022 2146   PROTEINUR NEGATIVE 09/06/2022 2146   UROBILINOGEN 0.2 10/17/2014 1940   NITRITE NEGATIVE 09/06/2022 2146   LEUKOCYTESUR NEGATIVE 09/06/2022 2146   Sepsis Labs Recent Labs  Lab 09/06/22 1554 09/07/22 0511 09/08/22 0705 09/09/22 0609  WBC 2.6* 2.9* 2.2* 2.5*   Microbiology Recent Results (from the past 240 hour(s))   Resp panel by RT-PCR (RSV, Flu A&B, Covid) Anterior Nasal Swab     Status: None   Collection Time: 09/06/22  5:33 PM   Specimen: Anterior Nasal Swab  Result Value Ref Range Status   SARS Coronavirus 2 by RT PCR NEGATIVE NEGATIVE Final    Comment: (NOTE) SARS-CoV-2 target nucleic acids are NOT DETECTED.  The SARS-CoV-2 RNA is generally detectable in upper respiratory specimens during the acute phase of infection. The lowest concentration of SARS-CoV-2 viral copies this assay can detect is 138 copies/mL. A negative result does not preclude SARS-Cov-2 infection and should not be used as the sole basis for treatment or other patient management decisions. A negative result may occur with  improper specimen collection/handling, submission of specimen other than nasopharyngeal swab, presence of viral mutation(s) within the areas targeted by this assay, and inadequate number of viral copies(<138 copies/mL). A negative result must be combined with clinical observations, patient history, and epidemiological information. The expected result is Negative.  Fact Sheet for Patients:  EntrepreneurPulse.com.au  Fact Sheet for Healthcare Providers:  IncredibleEmployment.be  This test is no t yet approved or cleared by the [place] FDA and  has been authorized for detection and/or diagnosis of SARS-CoV-2 by FDA under an Emergency Use Authorization (EUA). This EUA will remain  in effect (meaning this test can be used) for the duration of the COVID-19 declaration under Section 564(b)(1) of the Act, 21 U.S.C.section 360bbb-3(b)(1), unless the authorization is terminated  or revoked sooner.       Influenza A by PCR NEGATIVE NEGATIVE Final   Influenza B by PCR NEGATIVE NEGATIVE Final    Comment: (NOTE) The Xpert Xpress SARS-CoV-2/FLU/RSV plus assay is intended as an aid in the diagnosis of influenza from Nasopharyngeal swab specimens and should not be used  as a sole basis for treatment. Nasal washings and aspirates are unacceptable for Xpert Xpress SARS-CoV-2/FLU/RSV testing.  Fact Sheet for Patients: EntrepreneurPulse.com.au  Fact Sheet for Healthcare Providers: IncredibleEmployment.be  This test is not yet approved or cleared by the [place] FDA and has been authorized for detection and/or diagnosis of SARS-CoV-2 by FDA under an Emergency Use Authorization (EUA). This EUA will remain in effect (meaning this test can be used) for the duration of the COVID-19 declaration under Section 564(b)(1) of the Act, 21 U.S.C. section 360bbb-3(b)(1), unless the authorization is terminated or revoked.     Resp Syncytial Virus by PCR NEGATIVE NEGATIVE Final    Comment: (NOTE) Fact Sheet for Patients: EntrepreneurPulse.com.au  Fact Sheet for Healthcare Providers: IncredibleEmployment.be  This test is not yet approved or cleared by the [place] FDA and has been authorized for detection and/or diagnosis of SARS-CoV-2 by FDA under an Emergency Use Authorization (EUA). This EUA will remain in effect (meaning this test can be used) for the duration of the COVID-19 declaration under Section 564(b)(1) of the Act, 21 U.S.C. section 360bbb-3(b)(1), unless the authorization is terminated or revoked.  Performed at [facility], [address] [place],  [place] [address]     Time coordinating discharge: 44 mins   SIGNED:  [place] Brakeman, MD  [facility] 09/09/2022, 11:42 AM How to contact the [facility] Attending or Consulting provider [place] or covering provider during after hours [place], for this patient?  Check the care team in [facility] and look for a) attending/consulting [facility] provider listed and b) the [facility] team listed Log into [email or web address] and use [place]'s universal password to access. If you do not have the password, please contact the hospital  operator. Locate the [facility] provider you are looking for under [facility] and page to a number that you can be directly reached. If you still have difficulty reaching the provider, please page the [facility] (Director on Call) for the Hospitalists listed on amion for assistance.

## 2022-09-13 ENCOUNTER — Encounter (HOSPITAL_COMMUNITY): Payer: Self-pay | Admitting: Gastroenterology

## 2022-09-14 ENCOUNTER — Inpatient Hospital Stay: Payer: Medicaid Other | Attending: Hematology

## 2022-09-14 DIAGNOSIS — Z86711 Personal history of pulmonary embolism: Secondary | ICD-10-CM | POA: Diagnosis not present

## 2022-09-14 DIAGNOSIS — Z7901 Long term (current) use of anticoagulants: Secondary | ICD-10-CM | POA: Insufficient documentation

## 2022-09-14 DIAGNOSIS — D696 Thrombocytopenia, unspecified: Secondary | ICD-10-CM

## 2022-09-14 DIAGNOSIS — Z86718 Personal history of other venous thrombosis and embolism: Secondary | ICD-10-CM | POA: Insufficient documentation

## 2022-09-14 DIAGNOSIS — D61818 Other pancytopenia: Secondary | ICD-10-CM | POA: Insufficient documentation

## 2022-09-14 DIAGNOSIS — D509 Iron deficiency anemia, unspecified: Secondary | ICD-10-CM | POA: Insufficient documentation

## 2022-09-14 DIAGNOSIS — E538 Deficiency of other specified B group vitamins: Secondary | ICD-10-CM

## 2022-09-14 DIAGNOSIS — D72819 Decreased white blood cell count, unspecified: Secondary | ICD-10-CM

## 2022-09-14 DIAGNOSIS — I82409 Acute embolism and thrombosis of unspecified deep veins of unspecified lower extremity: Secondary | ICD-10-CM

## 2022-09-14 LAB — COMPREHENSIVE METABOLIC PANEL
ALT: 32 U/L (ref 0–44)
AST: 67 U/L — ABNORMAL HIGH (ref 15–41)
Albumin: 1.9 g/dL — ABNORMAL LOW (ref 3.5–5.0)
Alkaline Phosphatase: 105 U/L (ref 38–126)
Anion gap: 4 — ABNORMAL LOW (ref 5–15)
BUN: 7 mg/dL (ref 6–20)
CO2: 27 mmol/L (ref 22–32)
Calcium: 8.1 mg/dL — ABNORMAL LOW (ref 8.9–10.3)
Chloride: 105 mmol/L (ref 98–111)
Creatinine, Ser: 0.89 mg/dL (ref 0.61–1.24)
GFR, Estimated: >60 mL/min (ref >60)
Glucose, Bld: 126 mg/dL — ABNORMAL HIGH (ref 70–99)
Potassium: 3.3 mmol/L — ABNORMAL LOW (ref 3.5–5.1)
Sodium: 136 mmol/L (ref 135–145)
Total Bilirubin: 1 mg/dL (ref 0.3–1.2)
Total Protein: 6.5 g/dL (ref 6.5–8.1)

## 2022-09-14 LAB — CBC WITH DIFFERENTIAL/PLATELET
Abs Immature Granulocytes: 0 10*3/uL (ref 0.00–0.07)
Basophils Absolute: 0 10*3/uL (ref 0.0–0.1)
Basophils Relative: 0 %
Eosinophils Absolute: 0.1 10*3/uL (ref 0.0–0.5)
Eosinophils Relative: 2 %
HCT: 28.4 % — ABNORMAL LOW (ref 39.0–52.0)
Hemoglobin: 8.8 g/dL — ABNORMAL LOW (ref 13.0–17.0)
Lymphocytes Relative: 47 %
Lymphs Abs: 1.2 10*3/uL (ref 0.7–4.0)
MCH: 31.2 pg (ref 26.0–34.0)
MCHC: 31 g/dL (ref 30.0–36.0)
MCV: 100.7 fL — ABNORMAL HIGH (ref 80.0–100.0)
Monocytes Absolute: 0.3 10*3/uL (ref 0.1–1.0)
Monocytes Relative: 13 %
Neutro Abs: 1 10*3/uL — ABNORMAL LOW (ref 1.7–7.7)
Neutrophils Relative %: 38 %
Platelets: 84 10*3/uL — ABNORMAL LOW (ref 150–400)
RBC: 2.82 MIL/uL — ABNORMAL LOW (ref 4.22–5.81)
RDW: 15.8 % — ABNORMAL HIGH (ref 11.5–15.5)
WBC: 2.6 10*3/uL — ABNORMAL LOW (ref 4.0–10.5)
nRBC: 0 % (ref 0.0–0.2)

## 2022-09-14 LAB — VITAMIN B12: Vitamin B-12: 1015 pg/mL — ABNORMAL HIGH (ref 180–914)

## 2022-09-14 LAB — IMMATURE PLATELET FRACTION: Immature Platelet Fraction: 2.8 % (ref 1.2–8.6)

## 2022-09-14 LAB — IRON AND TIBC
Iron: 37 ug/dL — ABNORMAL LOW (ref 45–182)
Saturation Ratios: 14 % — ABNORMAL LOW (ref 17.9–39.5)
TIBC: 270 ug/dL (ref 250–450)
UIBC: 233 ug/dL

## 2022-09-14 LAB — FERRITIN: Ferritin: 25 ng/mL (ref 24–336)

## 2022-09-16 LAB — METHYLMALONIC ACID, SERUM: Methylmalonic Acid, Quantitative: 93 nmol/L (ref 0–378)

## 2022-09-17 ENCOUNTER — Other Ambulatory Visit: Payer: Self-pay | Admitting: Physician Assistant

## 2022-09-17 NOTE — Progress Notes (Signed)
Patient had recent hospital discharge on 09/09/2022, in which he was treated for acute blood loss anemia from GI bleeding.  Routine labs for upcoming hematology appointment were performed on 09/14/2022, which showed Hgb 8.8 and low iron levels.  Since patient's follow-up appointment is not until 10/07/2022, we will go ahead and get him scheduled for the following: - IV Venofer 300 mg x 4 - Repeat CBC + BB sample on Tuesday, 09/28/2022 - Addition of same-day CBC/D+ BB sample on the date of his follow-up appointment 10/07/2022  Harriett Rush, PA-C 09/17/22 6:39 PM

## 2022-09-20 ENCOUNTER — Other Ambulatory Visit: Payer: Self-pay

## 2022-09-20 DIAGNOSIS — D61818 Other pancytopenia: Secondary | ICD-10-CM

## 2022-09-20 DIAGNOSIS — D72819 Decreased white blood cell count, unspecified: Secondary | ICD-10-CM

## 2022-09-20 DIAGNOSIS — E611 Iron deficiency: Secondary | ICD-10-CM

## 2022-09-20 DIAGNOSIS — D509 Iron deficiency anemia, unspecified: Secondary | ICD-10-CM

## 2022-09-20 DIAGNOSIS — D696 Thrombocytopenia, unspecified: Secondary | ICD-10-CM

## 2022-09-21 ENCOUNTER — Ambulatory Visit: Payer: Medicaid Other | Admitting: Physician Assistant

## 2022-10-06 ENCOUNTER — Other Ambulatory Visit (HOSPITAL_COMMUNITY)
Admission: RE | Admit: 2022-10-06 | Discharge: 2022-10-06 | Disposition: A | Discharge status: Home / Self Care | Payer: Medicaid Other | Source: Ambulatory Visit | Attending: Physician Assistant | Admitting: Physician Assistant

## 2022-10-06 ENCOUNTER — Inpatient Hospital Stay: Payer: Medicaid Other | Attending: Hematology

## 2022-10-06 DIAGNOSIS — Z87440 Personal history of urinary (tract) infections: Secondary | ICD-10-CM | POA: Diagnosis not present

## 2022-10-06 DIAGNOSIS — Z86718 Personal history of other venous thrombosis and embolism: Secondary | ICD-10-CM | POA: Insufficient documentation

## 2022-10-06 DIAGNOSIS — Z86711 Personal history of pulmonary embolism: Secondary | ICD-10-CM | POA: Diagnosis not present

## 2022-10-06 DIAGNOSIS — D72819 Decreased white blood cell count, unspecified: Secondary | ICD-10-CM | POA: Diagnosis not present

## 2022-10-06 DIAGNOSIS — D696 Thrombocytopenia, unspecified: Secondary | ICD-10-CM | POA: Diagnosis not present

## 2022-10-06 DIAGNOSIS — Z7901 Long term (current) use of anticoagulants: Secondary | ICD-10-CM | POA: Insufficient documentation

## 2022-10-06 DIAGNOSIS — D500 Iron deficiency anemia secondary to blood loss (chronic): Secondary | ICD-10-CM | POA: Diagnosis not present

## 2022-10-06 LAB — CBC WITH DIFFERENTIAL/PLATELET
Abs Immature Granulocytes: 0.01 10*3/uL (ref 0.00–0.07)
Basophils Absolute: 0 10*3/uL (ref 0.0–0.1)
Basophils Relative: 1 %
Eosinophils Absolute: 0.1 10*3/uL (ref 0.0–0.5)
Eosinophils Relative: 2 %
HCT: 34.1 % — ABNORMAL LOW (ref 39.0–52.0)
Hemoglobin: 11.1 g/dL — ABNORMAL LOW (ref 13.0–17.0)
Immature Granulocytes: 0 %
Lymphocytes Relative: 34 %
Lymphs Abs: 0.9 10*3/uL (ref 0.7–4.0)
MCH: 31.7 pg (ref 26.0–34.0)
MCHC: 32.6 g/dL (ref 30.0–36.0)
MCV: 97.4 fL (ref 80.0–100.0)
Monocytes Absolute: 0.5 10*3/uL (ref 0.1–1.0)
Monocytes Relative: 18 %
Neutro Abs: 1.3 10*3/uL — ABNORMAL LOW (ref 1.7–7.7)
Neutrophils Relative %: 45 %
Platelets: 68 10*3/uL — ABNORMAL LOW (ref 150–400)
RBC: 3.5 MIL/uL — ABNORMAL LOW (ref 4.22–5.81)
RDW: 15.5 % (ref 11.5–15.5)
WBC: 2.8 10*3/uL — ABNORMAL LOW (ref 4.0–10.5)
nRBC: 0 % (ref 0.0–0.2)

## 2022-10-06 LAB — SAMPLE TO BLOOD BANK

## 2022-10-07 ENCOUNTER — Ambulatory Visit: Payer: Medicaid Other

## 2022-10-07 ENCOUNTER — Ambulatory Visit: Payer: Medicaid Other | Admitting: Physician Assistant

## 2022-10-08 ENCOUNTER — Inpatient Hospital Stay (HOSPITAL_BASED_OUTPATIENT_CLINIC_OR_DEPARTMENT_OTHER): Payer: Medicaid Other | Admitting: Nurse Practitioner

## 2022-10-08 ENCOUNTER — Inpatient Hospital Stay: Payer: Medicaid Other

## 2022-10-08 ENCOUNTER — Other Ambulatory Visit: Payer: Self-pay

## 2022-10-08 VITALS — BP 104/91 | HR 82 | Temp 97.8°F | Resp 18 | Wt 215.7 lb

## 2022-10-08 DIAGNOSIS — D696 Thrombocytopenia, unspecified: Secondary | ICD-10-CM

## 2022-10-08 DIAGNOSIS — D72819 Decreased white blood cell count, unspecified: Secondary | ICD-10-CM

## 2022-10-08 DIAGNOSIS — D61818 Other pancytopenia: Secondary | ICD-10-CM

## 2022-10-08 DIAGNOSIS — D500 Iron deficiency anemia secondary to blood loss (chronic): Secondary | ICD-10-CM

## 2022-10-08 DIAGNOSIS — F17210 Nicotine dependence, cigarettes, uncomplicated: Secondary | ICD-10-CM

## 2022-10-08 DIAGNOSIS — D509 Iron deficiency anemia, unspecified: Secondary | ICD-10-CM | POA: Diagnosis not present

## 2022-10-08 DIAGNOSIS — E538 Deficiency of other specified B group vitamins: Secondary | ICD-10-CM

## 2022-10-08 DIAGNOSIS — I82409 Acute embolism and thrombosis of unspecified deep veins of unspecified lower extremity: Secondary | ICD-10-CM

## 2022-10-08 DIAGNOSIS — Z86711 Personal history of pulmonary embolism: Secondary | ICD-10-CM

## 2022-10-08 DIAGNOSIS — K7460 Unspecified cirrhosis of liver: Secondary | ICD-10-CM | POA: Diagnosis not present

## 2022-10-08 DIAGNOSIS — Z7901 Long term (current) use of anticoagulants: Secondary | ICD-10-CM

## 2022-10-08 DIAGNOSIS — Z86718 Personal history of other venous thrombosis and embolism: Secondary | ICD-10-CM

## 2022-10-08 DIAGNOSIS — M069 Rheumatoid arthritis, unspecified: Secondary | ICD-10-CM

## 2022-10-08 DIAGNOSIS — K5090 Crohn's disease, unspecified, without complications: Secondary | ICD-10-CM

## 2022-10-08 MED ORDER — LORATADINE 10 MG PO TABS
10.0000 mg | ORAL_TABLET | Freq: Once | ORAL | Status: AC
  Administered 2022-10-08: 10 mg via ORAL
  Filled 2022-10-08: qty 1

## 2022-10-08 MED ORDER — METHYLPREDNISOLONE SODIUM SUCC 125 MG IJ SOLR
125.0000 mg | Freq: Once | INTRAMUSCULAR | Status: AC
  Administered 2022-10-08: 125 mg via INTRAVENOUS
  Filled 2022-10-08: qty 2

## 2022-10-08 MED ORDER — ACETAMINOPHEN 325 MG PO TABS
650.0000 mg | ORAL_TABLET | Freq: Once | ORAL | Status: AC
  Administered 2022-10-08: 650 mg via ORAL
  Filled 2022-10-08: qty 2

## 2022-10-08 MED ORDER — FAMOTIDINE IN NACL 20-0.9 MG/50ML-% IV SOLN
20.0000 mg | Freq: Once | INTRAVENOUS | Status: AC
  Administered 2022-10-08: 20 mg via INTRAVENOUS
  Filled 2022-10-08: qty 50

## 2022-10-08 MED ORDER — SODIUM CHLORIDE 0.9 % IV SOLN: Freq: Once | INTRAVENOUS | Status: AC

## 2022-10-08 MED ORDER — SODIUM CHLORIDE 0.9 % IV SOLN
300.0000 mg | Freq: Once | INTRAVENOUS | Status: AC
  Administered 2022-10-08: 300 mg via INTRAVENOUS
  Filled 2022-10-08: qty 300

## 2022-10-08 NOTE — Progress Notes (Signed)
Pt presents today for Venofer IV iron infusion per provider's order. Vital signs stable and pt voiced no new complaints at this time.  Peripheral IV started with good blood return pre and post infusion.  Venofer 300 mg  given today per MD orders. Tolerated infusion without adverse affects. Vital signs stable. No complaints at this time. Discharged from clinic ambulatory in stable condition. Alert and oriented x 3. F/U with [facility] as scheduled.   

## 2022-10-08 NOTE — Patient Instructions (Signed)
[place] [facility]  Discharge Instructions: Thank you for choosing [facility] to provide your oncology and hematology care.  If you have a lab appointment with the [facility], please come in thru the Main Entrance and check in at the main information desk.  Wear comfortable clothing and clothing appropriate for easy access to any Portacath or PICC line.   We strive to give you quality time with your provider. You may need to reschedule your appointment if you arrive late (15 or more minutes).  Arriving late affects you and other patients whose appointments are after yours.  Also, if you miss three or more appointments without notifying the office, you may be dismissed from the clinic at the provider's discretion.      For prescription refill requests, have your pharmacy contact our office and allow 72 hours for refills to be completed.    Today you received the Venofer IV iron infusion.     BELOW ARE SYMPTOMS THAT SHOULD BE REPORTED IMMEDIATELY: *FEVER GREATER THAN 100.4 F (38 C) OR HIGHER *CHILLS OR SWEATING *NAUSEA AND VOMITING THAT IS NOT CONTROLLED WITH YOUR NAUSEA MEDICATION *UNUSUAL SHORTNESS OF BREATH *UNUSUAL BRUISING OR BLEEDING *URINARY PROBLEMS (pain or burning when urinating, or frequent urination) *BOWEL PROBLEMS (unusual diarrhea, constipation, pain near the anus) TENDERNESS IN MOUTH AND THROAT WITH OR WITHOUT PRESENCE OF ULCERS (sore throat, sores in mouth, or a toothache) UNUSUAL RASH, SWELLING OR PAIN  UNUSUAL VAGINAL DISCHARGE OR ITCHING   Items with * indicate a potential emergency and should be followed up as soon as possible or go to the Emergency Department if any problems should occur.  Please show the CHEMOTHERAPY ALERT CARD or IMMUNOTHERAPY ALERT CARD at check-in to the Emergency Department and triage nurse.  Should you have questions after your visit or need to cancel or reschedule your appointment, please contact [facility] [phone]  and follow the prompts.  Office hours are 8:00 a.m. to 4:30 p.m. Monday - Friday. Please note that voicemails left after 4:00 p.m. may not be returned until the following business day.  We are closed weekends and major holidays. You have access to a nurse at all times for urgent questions. Please call the main number to the clinic [phone] and follow the prompts.  For any non-urgent questions, you may also contact your provider using MyChart. We now offer e-Visits for anyone 80 and older to request care online for non-urgent symptoms. For details visit [email or web address].   Also download the MyChart app! Go to the app store, search "MyChart", open the app, select [facility], and log in with your MyChart username and password.

## 2022-10-08 NOTE — Progress Notes (Signed)
[name], [place] [address]   CLINIC:  Medical Oncology/Hematology  PCP:  [name] Robert, MD [address] [place] [address] [phone]   Virtual Visit Progress Note  I connected with [name] on 10/08/22 at  8:00 AM EST by video enabled telemedicine visit and verified that I am speaking with the correct person using two identifiers.   I discussed the limitations, risks, security and privacy concerns of performing an evaluation and management service by telemedicine and the availability of in-person appointments. I also discussed with the patient that there may be a patient responsible charge related to this service. The patient expressed understanding and agreed to proceed.   Other persons participating in the visit and their role in the encounter: RN, NP, Patient   Patient's location: [facility]  Provider's location: home  REASON FOR VISIT:  Follow-up for pancytopenia, iron deficiency, recurrent DVTs   CURRENT THERAPY: Eliquis  INTERVAL HISTORY:  [name] 57 y.o. male returns for routine follow-up of pancytopenia, iron deficiency, and recurrent DVTs.  He was last seen by [name] Brenner PA-C on 09/17/22 for hospital follow up.   Last iron was September 2023.   Today, he reports feeling fair. Continues eliquis without any major bleeding. Continues to bruise easily. No shortness of breath or chest pain. No fevers, chills, night sweats or unintentional weight loss. He continues to try to quit smoking. Denies rectal bleeding or melena. Intermittent lightheadedness.    REVIEW OF SYSTEMS:  Review of Systems  Constitutional:  Positive for fatigue. Negative for appetite change, chills, diaphoresis, fever and unexpected weight change.  HENT:   Negative for lump/mass and nosebleeds.   Eyes:  Negative for eye problems.  Respiratory:  Positive for cough (when smoking). Negative for hemoptysis and shortness of breath.    Cardiovascular:  Negative for chest pain, leg swelling and palpitations.  Gastrointestinal:  Positive for nausea and vomiting. Negative for abdominal pain, blood in stool, constipation and diarrhea.  Genitourinary:  Negative for dysuria, frequency and hematuria.   Skin: Negative.   Neurological:  Positive for headaches. Negative for dizziness and light-headedness.  Hematological:  Does not bruise/bleed easily.      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, s/p replacement, due to prednisone   Bipolar disorder (HCC)    Colostomy in place [facility])    Crohn's colitis (HCC)    s/p total colectomy with ileostomy in 2009   Crohn's disease of small intestine [facility]) Sept 2012   ileoscopy: multiple ulcers likely secondary to Crohn's   DEEP VENOUS THROMBOPHLEBITIS, HX OF 02/11/2009   Qualifier: Diagnosis of  By: [name]     Depression    DVT (deep venous thrombosis) (HCC) 2009   right upper extremity due to PICC   Enterococcus UTI 2009   GERD (gastroesophageal reflux disease)    Hernia    Hyperlipidemia    IDA (iron deficiency anemia) 06/17/2022   Insomnia    Migraine headache    Nystagmus    PULMONARY EMBOLISM, HX OF 02/11/2009   Qualifier: Diagnosis of  By: [name]     S/P endoscopy Sept 2012   mild gastritis, small hiatal hernia, no ulcers   Schizophrenia (HCC)    Schizophrenia, acute (HCC)    Past Surgical History:  Procedure Laterality Date  APPENDECTOMY     CYSTOSCOPY WITH DIRECT VISION INTERNAL URETHROTOMY N/A 03/25/2022   Procedure: CYSTOSCOPY WITH DIRECT VISION INTERNAL URETHROTOMY;  Surgeon: [name] Gauze, MD;  Location: [facility];  Service: Urology;  Laterality: N/A;   ESOPHAGOGASTRODUODENOSCOPY  07/2010   gastritis,  bx neg for H.Pylori   ESOPHAGOGASTRODUODENOSCOPY N/A 01/08/2014   SLF:NO SOURCE FOR ODYNOPHAGIA IDENTIFIED/Multiple small ulcers in the  gastric antrum   ESOPHAGOGASTRODUODENOSCOPY (EGD) WITH PROPOFOL N/A 09/07/2022   Procedure: ESOPHAGOGASTRODUODENOSCOPY (EGD) WITH PROPOFOL;  Surgeon: [name] Frame, MD;  Location: [facility];  Service: Gastroenterology;  Laterality: N/A;   EYE SURGERY     HOT HEMOSTASIS  09/07/2022   Procedure: HOT HEMOSTASIS (ARGON PLASMA COAGULATION/BICAP);  Surgeon: [name], [name] Boom, MD;  Location: [facility];  Service: Gastroenterology;;   ILEOSCOPY  06/29/2011   SLF: ulcers,multiple/small HH/mild gastritis   KIDNEY SURGERY     LAPAROTOMY N/A 06/05/2013   Procedure: EXPLORATORY LAPAROTOMY;  Surgeon: [name] Bering, MD;  Location: [facility];  Service: General;  Laterality: N/A;   LYSIS OF ADHESION N/A 06/05/2013   Procedure: LYSIS OF ADHESIONS;  Surgeon: [name] Bering, MD;  Location: [facility];  Service: General;  Laterality: N/A;   small bowel capsule study  08/2010   few tiny nonbleeding erosions/ulcers ?secondary to relafen or Crohn's. Entire small bowel not seen.    TOTAL COLECTOMY  2009   with ileostomy at [facility] for refractory disease    TOTAL HIP ARTHROPLASTY     right, due to avascular necrosis from chronic prednisone   TOTAL SHOULDER REPLACEMENT     bilateral   TRANSURETHRAL RESECTION OF PROSTATE N/A 12/03/2019   Procedure: TRANSURETHRAL RESECTION OF THE PROSTATE (TURP);  Surgeon: [name] Gauze, MD;  Location: [facility];  Service: Urology;  Laterality: N/A;     SOCIAL HISTORY:  Social History   Socioeconomic History   Marital status: Single    Spouse name: Not on file   Number of children: Not on file   Years of education: Not on file   Highest education level: Not on file  Occupational History   Not on file  Tobacco Use   Smoking status: Every Day    Packs/day: 1.00    Years: 37.00    Total pack years: 37.00    Types: Cigarettes   Smokeless tobacco: Never   Tobacco comments:    down to 4ppd 11/14/20; down to 1ppd 09/07/2022  Vaping Use   Vaping Use: Never  used  Substance and Sexual Activity   Alcohol use: No   Drug use: No   Sexual activity: Never  Other Topics Concern   Not on file  Social History Narrative   Girlfriend of six years, Rectal Cancer on Hospice (05/2011).   Social Determinants of Health   Financial Resource Strain: Not on file  Food Insecurity: Food Insecurity Present (09/07/2022)   Hunger Vital Sign    Worried About Running Out of Food in the Last Year: Often true    Ran Out of Food in the Last Year: Often true  Transportation Needs: No Transportation Needs (09/07/2022)   PRAPARE - Administrator, Civil Service (Medical): No    Lack of Transportation (Non-Medical): No  Physical Activity: Not on file  Stress: Not on file  Social Connections: Not on file  Intimate Partner Violence: Not At Risk (09/07/2022)   Humiliation, Afraid, Rape, and Kick questionnaire    Fear of Current or Ex-Partner: No    Emotionally  Abused: No    Physically Abused: No    Sexually Abused: No    FAMILY HISTORY:  Family History  Problem Relation Age of Onset   Diabetes Mother    Heart disease Mother    Diabetes Father    Heart disease Father    COPD Maternal Grandmother    Colon cancer Neg Hx     CURRENT MEDICATIONS:  Outpatient Encounter Medications as of 10/08/2022  Medication Sig Note   albuterol (PROVENTIL) (2.5 MG/3ML) 0.083% nebulizer solution Take 2.5 mg by nebulization every 6 (six) hours as needed for wheezing or shortness of breath.    albuterol (VENTOLIN HFA) 108 (90 Base) MCG/ACT inhaler Inhale 2 puffs into the lungs every 6 (six) hours as needed for wheezing or shortness of breath.    atorvastatin (LIPITOR) 80 MG tablet Take 80 mg by mouth daily.    benztropine (COGENTIN) 0.5 MG tablet Take 0.5 mg by mouth daily.     CALCIUM 600/VITAMIN D3 600-800 MG-UNIT TABS TAKE (1) TABLET BY MOUTH THREE TIMES DAILY AFTER MEALS.    cyanocobalamin 1000 MCG tablet Take 1,000 mcg by mouth daily.    divalproex (DEPAKOTE) 250 MG  DR tablet Take 250 mg by mouth daily.    divalproex (DEPAKOTE) 500 MG DR tablet Take 1,000 mg by mouth at bedtime.    ELIQUIS 5 MG TABS tablet Take 1 tablet (5 mg total) by mouth 2 (two) times daily.    ferrous sulfate 325 (65 FE) MG EC tablet Take 1 tablet (325 mg total) by mouth daily with breakfast.    folic acid (FOLVITE) 1 MG tablet Take 1 tablet by mouth daily.    gabapentin (NEURONTIN) 300 MG capsule Take 900 mg by mouth 3 (three) times daily.    hydroxychloroquine (PLAQUENIL) 200 MG tablet Take 1 tablet by mouth 2 (two) times daily.    metoprolol succinate (TOPROL-XL) 25 MG 24 hr tablet Take 1 tablet (25 mg total) by mouth daily. Take with or immediately following a meal.    Misc. Devices (WALKER WHEELS) MISC USE AS DIRECTED    nitroGLYCERIN (NITROSTAT) 0.4 MG SL tablet Place 1 tablet (0.4 mg total) under the tongue every 5 (five) minutes as needed for chest pain.    pantoprazole (PROTONIX) 40 MG tablet TAKE (1) TABLET BY MOUTH TWICE A DAY BEFORE MEALS. (BREAKFAST AND SUPPER)    risperiDONE (RISPERDAL) 1 MG tablet Take 1 mg by mouth at bedtime. 09/07/2022: Take with 6mg  at bedtime total of 7mg    risperiDONE (RISPERDAL) 3 MG tablet Take 6 mg by mouth at bedtime. 09/07/2022: Take with 1mg  at bedtime total of 7mg    sertraline (ZOLOFT) 50 MG tablet Take 50 mg by mouth daily.    tamsulosin (FLOMAX) 0.4 MG CAPS capsule Take 0.4 mg by mouth daily.    torsemide (DEMADEX) 20 MG tablet Take 1 tablet (20 mg total) by mouth daily.    traZODone (DESYREL) 100 MG tablet Take 100 mg by mouth at bedtime.    No facility-administered encounter medications on file as of 10/08/2022.    ALLERGIES:  Allergies  Allergen Reactions   Penicillins Anaphylaxis   Bactrim [Sulfamethoxazole-Trimethoprim] Other (See Comments)    ABD CRAMPS AND DIARRHEA Welps/itch   Oxycodone-Acetaminophen Other (See Comments)    Other reaction(s): Swelling   Remicade [Infliximab] Other (See Comments)    COULDN'T BREATHE   Aspirin      REACTION: unknown reaction   Codeine     REACTION: unknown reaction   Ibuprofen  REACTION: unknown reaction   Macrobid [Nitrofurantoin] Itching   Tramadol Nausea Only    Other reaction(s): Nausea   Tramadol Hcl     REACTION: unknown reaction   Vicodin [Hydrocodone-Acetaminophen] Nausea Only     PHYSICAL EXAM:    ECOG PERFORMANCE STATUS: 1 - Symptomatic but completely ambulatory  There were no vitals filed for this visit. There were no vitals filed for this visit. Physical Exam Vitals reviewed.  Constitutional:      Appearance: He is not ill-appearing.  Pulmonary:     Effort: No respiratory distress.  Skin:    Coloration: Skin is not pale.  Neurological:     Mental Status: He is alert and oriented to person, place, and time.  Psychiatric:        Mood and Affect: Mood normal.        Behavior: Behavior normal.    LABORATORY DATA:  I have reviewed the labs as listed.  CBC    Component Value Date/Time   WBC 2.8 (L) 10/06/2022 1214   RBC 3.50 (L) 10/06/2022 1214   HGB 11.1 (L) 10/06/2022 1214   HCT 34.1 (L) 10/06/2022 1214   HCT 42 05/24/2011 1417   PLT 68 (L) 10/06/2022 1214   MCV 97.4 10/06/2022 1214   MCH 31.7 10/06/2022 1214   MCHC 32.6 10/06/2022 1214   RDW 15.5 10/06/2022 1214   LYMPHSABS 0.9 10/06/2022 1214   MONOABS 0.5 10/06/2022 1214   EOSABS 0.1 10/06/2022 1214   BASOSABS 0.0 10/06/2022 1214      Latest Ref Rng & Units 09/14/2022    2:10 PM 09/09/2022    6:09 AM 09/08/2022    5:51 AM  CMP  Glucose 70 - 99 mg/dL 686  72  72   BUN 6 - 20 mg/dL 7  8  11    Creatinine 0.61 - 1.24 mg/dL 1.68  3.72  9.02   Sodium 135 - 145 mmol/L 136  137  134   Potassium 3.5 - 5.1 mmol/L 3.3  4.0  4.0   Chloride 98 - 111 mmol/L 105  105  104   CO2 22 - 32 mmol/L 27  30  27    Calcium 8.9 - 10.3 mg/dL 8.1  7.7  7.9   Total Protein 6.5 - 8.1 g/dL 6.5     Total Bilirubin 0.3 - 1.2 mg/dL 1.0     Alkaline Phos 38 - 126 U/L 105     AST 15 - 41 U/L 67     ALT 0 -  44 U/L 32      Iron/TIBC/Ferritin/ %Sat    Component Value Date/Time   IRON 37 (L) 09/14/2022 1411   TIBC 270 09/14/2022 1411   FERRITIN 25 09/14/2022 1411   FERRITIN 30.0 11/01/2011 1131   IRONPCTSAT 14 (L) 09/14/2022 1411   IRONPCTSAT 19 11/01/2011 1131    DIAGNOSTIC IMAGING:  I have independently reviewed the relevant imaging and discussed with the patient.  ASSESSMENT & PLAN: 1.  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/2021): Immunofixation and SPEP were negative for monoclonal gammopathy, light chains unremarkable (mildly elevated kappa 30.3, but normal lambda and normal ratio); normal LDH - Pathology smear review (04/22/2021): Macrocytic anemia with polychromasia, occasional teardrop and RBC fragments,  leukopenia with neutropenia - CT abdomen/pelvis (11/11/2021): No liver abnormality, spleen with 12.1 cm maximum diameter - Patient presented to ED on 05/06/2021 and was noted to have worsening pancytopenia, therefore mercaptopurine and methotrexate were discontinued (ED provider discussed with on-call GI as well, who agreed) - Most recent labs (10/06/2022): Hgb 11.1, MCV 97.4, WBC 2.8, ANC 1.3. plt 68.   No B12 deficiency. - He previously had frequent UTIs, but these resolved after surgical correction of bladder stricture.  No recent sinus infections or pneumonia.  Recently finished antibiotics for bilateral leg cellulitis. - No B symptoms     - Denies any major bleeding such as hematemesis, hematochezia, or melena    - Bone marrow biopsy (01/21/2022): Hypercellular marrow with trilineage hematopoiesis, no significant dysplasia, normal cytogenetics - Differential  diagnosis:  Leukopenia: Likely reactive related to frequent UTIs and antibiotic use, versus prolonged medication effects from mercaptopurine and methotrexate, versus reactive in the setting of chronic inflammatory disease (Crohn's disease) vs new diagnosis of cirrhosis.  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  - If patient has episode of platelets less than 50 in the future, would consider pulsed dexamethasone with recheck platelets in 7 to 10 days to see if they have improved -  - PLAN: No indication for treatment of leukopenia or thrombocytopenia at this time - Repeat labs and RTC in 3 months - Continue daily B12 (cyanocobalamin) 500 mcg.   2.  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   - Denies any major bleeding such as hematemesis, hematochezia, or melena   - Increased fatigue.  - tolerating oral iron well w/o side effects - Last iv iron in September 2023. Tolerates well. Possibly hx of reaction in [place] and required steroids - 09/14/22- Ferritin down to 25. Symptomatic.  - PLAN: Continue oral ferrous sulfate once daily.   - Recommend IV iron with Venofer 300 mg x 3 dose. He can receive premeds with steroids and pepcid. He will receive first dose today then rtc for 2 additional doses.  - will repeat cbc, ferritin, iron studies in 3 months.     3.  Recurrent DVTs with history of PE - Initial VTE events in 2009, around the same time the patient had colostomy placed - History of pulmonary embolism with CT angio on 01/31/2008 which was positive for PE and bilateral lower lobe pulmonary infarcts, likely source suspected to be left subclavian Port-A-Cath which appeared to have contiguous embolus extending from the entry site  into the left subclavian vein into the SVC - Venous ultrasound imaging (01/31/2008): Extensive deep vein thrombosis of left upper extremity extending into the jugular vein - Hypercoagulable panel was performed in 2009, which showed normal protein S, was negative for factor V Leiden gene mutation - Left lower extremity venous duplex (06/05/2020): Probable superficial thrombophlebitis in the left superficial greater saphenous vein - Patient reported that symptoms had worsened, and a repeat venous duplex performed in [place] on 06/19/2020 showed positive DVT involving common left femoral, profundofemoral, and superficial femoral veins - he was started on Eliquis at this time - Most recent venous duplex of left lower extremity (12/02/2020): Nonocclusive thrombus within the common femoral, profundofemoral, and superficial femoral veins on the left, felt to represent chronic recanalized thrombus - Patient has family history of unprovoked VTE in  several family members on his mother side - Further hypercoagulable work-up has been deferred, as it would not change the patient's need for lifelong anticoagulation at this point - Patient currently on Eliquis 5 mg twice daily (started after unprovoked DVT in September 2021), has previously been on warfarin (for treatment of upper extremity DVT/PE in 2009) - Most recent D-dimer (04/15/2021): 0.31 - PLAN: Continue Eliquis (indefinite anticoagulation as long as benefits outweigh risks)   4.  Crohn's disease & rheumatoid arthritis - Per external notes, disease was previously well controlled on methotrexate and mercaptopurine, but these unfortunately had to be discontinued due to worsening pancytopenia - Started on Humira instead.  Unfortunately, he developed severe UTI while on Humira, so it was discontinued. - He has been referred to an IBD clinic for specialized management of his Crohn's disease, but has not yet been started on any new medications     5.  Other  history - Patient has extensive other conditions including Crohn's disease, bipolar affective disorder, asthma, and other conditions noted elsewhere in medical record  Medical decision making: Moderate   I discussed the assessment and treatment plan with the patient. The patient was provided an opportunity to ask questions and all were answered. The patient agreed with the plan and demonstrated an understanding of the instructions.   The patient was advised to call back or seek an in-person evaluation if the symptoms worsen or if the condition fails to improve as anticipated.   I spent 30 minutes face-to-face video visit time dedicated to the care of this patient on the date of this encounter to include pre-visit review of heme/onc notes, labs, imaging, face-to-face time with the patient, and post visit ordering of testing/documentation.   Beckey Rutter, DNP, AGNP-C [place] [phone]

## 2022-10-11 ENCOUNTER — Ambulatory Visit (HOSPITAL_COMMUNITY): Payer: Medicaid Other | Attending: Emergency Medicine | Admitting: Physical Therapy

## 2022-10-11 ENCOUNTER — Encounter (HOSPITAL_COMMUNITY): Payer: Self-pay | Admitting: Physical Therapy

## 2022-10-11 DIAGNOSIS — W19XXXA Unspecified fall, initial encounter: Secondary | ICD-10-CM | POA: Insufficient documentation

## 2022-10-11 DIAGNOSIS — Z9181 History of falling: Secondary | ICD-10-CM | POA: Diagnosis not present

## 2022-10-11 DIAGNOSIS — M6281 Muscle weakness (generalized): Secondary | ICD-10-CM | POA: Diagnosis present

## 2022-10-11 NOTE — Therapy (Signed)
OUTPATIENT PHYSICAL THERAPY LOWER EXTREMITY EVALUATION   Patient Name: [name] MRN: [id number] DOB:June 04, 1966, 57 y.o., male Today's Date: 10/11/2022  END OF SESSION:  PT End of Session - 10/11/22 1331     Visit Number 1    Number of Visits 12    Date for PT Re-Evaluation 11/22/22    Authorization Type [employer]    Authorization Time Period Check auth    Authorization - Visit Number 1    Authorization - Number of Visits 1    Progress Note Due on Visit 4    PT Start Time 1304    PT Stop Time 1340    PT Time Calculation (min) 36 min    Equipment Utilized During Treatment Gait belt    Activity Tolerance Patient tolerated treatment well;Patient limited by fatigue    Behavior During Therapy WFL for tasks assessed/performed             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HCC)    right, s/p replacement, due to prednisone   Bipolar disorder (HCC)    Colostomy in place (HCC)    Crohn's colitis (HCC)    s/p total colectomy with ileostomy in 2009   Crohn's disease of small intestine [facility]) Sept 2012   ileoscopy: multiple ulcers likely secondary to Crohn's   DEEP VENOUS THROMBOPHLEBITIS, HX OF 02/11/2009   Qualifier: Diagnosis of  By: Diana Eves     Depression    DVT (deep venous thrombosis) (HCC) 2009   right upper extremity due to PICC   Enterococcus UTI 2009   GERD (gastroesophageal reflux disease)    Hernia    Hyperlipidemia    IDA (iron deficiency anemia) 06/17/2022   Insomnia    Migraine headache    Nystagmus    PULMONARY EMBOLISM, HX OF 02/11/2009   Qualifier: Diagnosis of  By: Diana Eves     S/P endoscopy Sept 2012   mild gastritis, small hiatal hernia, no ulcers   Schizophrenia (HCC)    Schizophrenia, acute (HCC)    Past Surgical History:  Procedure Laterality Date   APPENDECTOMY     CYSTOSCOPY WITH DIRECT VISION INTERNAL URETHROTOMY N/A 03/25/2022   Procedure: CYSTOSCOPY WITH DIRECT  VISION INTERNAL URETHROTOMY;  Surgeon: Malen Gauze, MD;  Location: [facility];  Service: Urology;  Laterality: N/A;   ESOPHAGOGASTRODUODENOSCOPY  07/2010   gastritis,  bx neg for H.Pylori   ESOPHAGOGASTRODUODENOSCOPY N/A 01/08/2014   SLF:NO SOURCE FOR ODYNOPHAGIA IDENTIFIED/Multiple small ulcers in the gastric antrum   ESOPHAGOGASTRODUODENOSCOPY (EGD) WITH PROPOFOL N/A 09/07/2022   Procedure: ESOPHAGOGASTRODUODENOSCOPY (EGD) WITH PROPOFOL;  Surgeon: Dolores Frame, MD;  Location: [facility];  Service: Gastroenterology;  Laterality: N/A;   EYE SURGERY     HOT HEMOSTASIS  09/07/2022   Procedure: HOT HEMOSTASIS (ARGON PLASMA COAGULATION/BICAP);  Surgeon: Marguerita Merles, Reuel Boom, MD;  Location: [facility];  Service: Gastroenterology;;   ILEOSCOPY  06/29/2011   SLF: ulcers,multiple/small HH/mild gastritis   KIDNEY SURGERY     LAPAROTOMY N/A 06/05/2013   Procedure: EXPLORATORY LAPAROTOMY;  Surgeon: Fabio Bering, MD;  Location: [facility];  Service: General;  Laterality: N/A;   LYSIS OF ADHESION N/A 06/05/2013   Procedure: LYSIS OF ADHESIONS;  Surgeon: Fabio Bering, MD;  Location: [facility];  Service: General;  Laterality: N/A;   small bowel capsule study  08/2010   few tiny nonbleeding erosions/ulcers ?secondary to relafen  or Crohn's. Entire small bowel not seen.    TOTAL COLECTOMY  2009   with ileostomy at [facility] for refractory disease    TOTAL HIP ARTHROPLASTY     right, due to avascular necrosis from chronic prednisone   TOTAL SHOULDER REPLACEMENT     bilateral   TRANSURETHRAL RESECTION OF PROSTATE N/A 12/03/2019   Procedure: TRANSURETHRAL RESECTION OF THE PROSTATE (TURP);  Surgeon: Cleon Gustin, MD;  Location: [facility];  Service: Urology;  Laterality: N/A;   Patient Active Problem List   Diagnosis Date Noted   Pancytopenia ([place]) 09/07/2022   AKI (acute kidney injury) ([place]) 09/07/2022   Hypoalbuminemia due to protein-calorie malnutrition ([place]) 09/07/2022   History of  DVT (deep vein thrombosis) 09/07/2022   History of pulmonary embolism 09/07/2022   GI bleed 09/06/2022   Iron deficiency anemia 06/17/2022   Bladder neck contracture 25/95/6387   Acute metabolic encephalopathy 56/43/3295   Hyponatremia 05/16/2021   DM type 2 (diabetes mellitus, type 2) ([place]) 05/16/2021   Diarrhea 09/04/2020   Abdominal pain 09/04/2020   Recurrent deep vein thrombosis (DVT) ([place]) 07/28/2020   At risk for osteopenia 02/06/2020   BPH (benign prostatic hyperplasia) 12/03/2019   Incomplete emptying of bladder 10/31/2019   Benign prostatic hyperplasia with urinary obstruction 10/31/2019   Falls, initial encounter 06/06/2019   Lactic acidosis 02/10/2018   Partial small bowel obstruction ([place]) 02/10/2018   Acute lower UTI 12/31/2017   SBO (small bowel obstruction) ([place]) 12/29/2017   Arthritis of knee, degenerative 02/14/2013   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 03/29/2012   Crohn's disease of both small and large intestine with complication ([place]) 18/84/1660   Hepatic steatosis 07/19/2011   Hyperlipidemia 02/11/2009   BIPOLAR AFFECTIVE DISORDER 02/11/2009   Nystagmus 02/11/2009   ASTHMA 02/11/2009   GERD 02/11/2009   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 He says he was in the hospital last year for a stroke. He had some trouble with balance and falls around that time. He says he has not had any falls since then. He does feel that he has weakness in his legs and knee troubles, which continues to impact his balance.    PERTINENT HISTORY: DM, falls, Bipolar, CVA, Rt THA, RA  PAIN:  Are you having pain? Yes: NPRS scale: 9/10 Pain location: bilat knees Pain description: sore, aching Aggravating factors: standing, walking Relieving factors: rest  PRECAUTIONS: Fall  WEIGHT BEARING RESTRICTIONS: No  FALLS:  Has patient fallen in last 6 months? No  LIVING ENVIRONMENT: Lives with: lives with their family and lives alone Lives in: House/apartment Stairs: No Has following equipment at home:  rollator   OCCUPATION: disability  PLOF: Independent with basic ADLs  PATIENT GOALS: Improve balance, walk better   NEXT MD VISIT:   OBJECTIVE:   DIAGNOSTIC FINDINGS: NA  COGNITION: Overall cognitive status: Within functional limits for tasks assessed   LOWER EXTREMITY ROM:  Limited ankle DF bilat, limited RT Hip flexion and knee extension   LOWER EXTREMITY MMT:  MMT Right eval Left eval  Hip flexion 4- 4-  Hip extension  Hip abduction    Hip adduction    Hip internal rotation    Hip external rotation    Knee flexion    Knee extension 3+ 4-  Ankle dorsiflexion 3- 3-  Ankle plantarflexion    Ankle inversion    Ankle eversion     (Blank rows = not tested)   FUNCTIONAL TESTS:  5 times sit to stand: 28.2 sec with UE and RW  2 minute walk test: 180 feet using Rollator (stopped due to fatigue)   GAIT: Flexed trunk, decreased stride, antalgic, using RW, fatigues quickly    TODAY'S TREATMENT:                                                                                                                              DATE:   10/11/22 Eval    PATIENT EDUCATION:  Education details: on Eval findings, POC and HEP  Person educated: Patient Education method: Explanation Education comprehension: verbalized understanding  HOME EXERCISE PROGRAM: Access Code: [email or web address] URL: [email or web address] Date: 10/11/2022 Prepared by: Josue Hector  Exercises - Seated Heel Toe  Raises  - 3 x daily - 7 x weekly - 2 sets - 10 reps - Seated March  - 3 x daily - 7 x weekly - 2 sets - 10 reps - Seated Long Arc Quad  - 3 x daily - 7 x weekly - 2 sets - 10 reps  ASSESSMENT:  CLINICAL IMPRESSION: Patient is a 57 y.o. male who presents to physical therapy with complaint of weakness and falls. Patient demonstrates decreased strength, balance deficits and gait abnormalities which are negatively impacting patient ability to perform ADLs and functional mobility tasks. Patient will benefit from skilled physical therapy services to address these deficits to improve level of function with ADLs, functional mobility tasks, and reduce risk for falls.    OBJECTIVE IMPAIRMENTS: Abnormal gait, decreased activity tolerance, decreased balance, decreased mobility, decreased ROM, decreased strength, hypomobility, improper body mechanics, and pain.   ACTIVITY LIMITATIONS: carrying, lifting, bending, sitting, standing, squatting, stairs, transfers, bed mobility, and locomotion level  PARTICIPATION LIMITATIONS: meal prep, cleaning, laundry, shopping, and community activity  PERSONAL FACTORS: Past/current experiences, Time since onset of injury/illness/exacerbation, and 3+ comorbidities: See above  are also affecting patient's functional outcome.   REHAB POTENTIAL: Fair See above  CLINICAL DECISION MAKING: Stable/uncomplicated  EVALUATION COMPLEXITY: Low   GOALS: SHORT TERM GOALS: Target date: 11/01/2022  Patient will be independent with initial HEP and self-management strategies to improve functional outcomes Baseline:  Goal status: INITIAL   LONG TERM GOALS: Target date: 11/22/2022  Patient will be independent with advanced HEP and self-management strategies to improve functional outcomes Baseline:  Goal status: INITIAL  2. Patient will be able to perform stand x 5 in < 15 seconds to demonstrate improvement in functional mobility and reduced risk for falls.  Baseline: 28.2 sec  using UE  Goal status: INITIAL  3. Patient will have equal to or > 4/5  MMT throughout BLE to improve ability to perform functional mobility, stair ambulation and ADLs.  Baseline: See MMT Goal status: INITIAL  4. Patient will be able to ambulate at least 300 feet during 2MWT with LRAD to demonstrate improved ability to perform functional mobility and associated tasks. Baseline: 180 using rollator Goal status: INITIAL  PLAN:  PT FREQUENCY: 1-2x/week  PT DURATION: 6 weeks  PLANNED INTERVENTIONS: Therapeutic exercises, Therapeutic activity, Neuromuscular re-education, Balance training, Gait training, Patient/Family education, Joint manipulation, Joint mobilization, Stair training, Aquatic Therapy, Dry Needling, Electrical stimulation, Spinal manipulation, Spinal mobilization, Cryotherapy, Moist heat, scar mobilization, Taping, Traction, Ultrasound, Biofeedback, Ionotophoresis 4mg /ml Dexamethasone, and Manual therapy.   PLAN FOR NEXT SESSION: Progress LE strength and balance as tolerated. Balance and gait as able.   1:32 PM, 10/11/22 Josue Hector PT DPT  Physical Therapist with [facility]  [phone]

## 2022-10-21 ENCOUNTER — Encounter (HOSPITAL_COMMUNITY): Payer: Medicaid Other | Admitting: Physical Therapy

## 2022-10-22 ENCOUNTER — Ambulatory Visit: Payer: Medicaid Other

## 2022-10-25 ENCOUNTER — Encounter (HOSPITAL_COMMUNITY): Payer: Medicaid Other | Admitting: Physical Therapy

## 2022-10-27 ENCOUNTER — Other Ambulatory Visit: Payer: Self-pay | Admitting: Gastroenterology

## 2022-10-28 ENCOUNTER — Encounter (HOSPITAL_COMMUNITY): Payer: Medicaid Other | Admitting: Physical Therapy

## 2022-10-29 ENCOUNTER — Inpatient Hospital Stay: Payer: Medicaid Other

## 2022-11-01 ENCOUNTER — Ambulatory Visit (HOSPITAL_COMMUNITY): Payer: Medicaid Other | Admitting: Physical Therapy

## 2022-11-01 DIAGNOSIS — Z9181 History of falling: Secondary | ICD-10-CM

## 2022-11-01 DIAGNOSIS — M6281 Muscle weakness (generalized): Secondary | ICD-10-CM | POA: Diagnosis not present

## 2022-11-01 NOTE — Therapy (Signed)
OUTPATIENT PHYSICAL THERAPY LOWER EXTREMITY Treatment   Patient Name: [name] MRN: [id number] DOB:Apr 10, 1966, 57 y.o., male Today's Date: 11/01/2022  END OF SESSION:  PT End of Session - 11/01/22 1605    Visit Number 2    Number of Visits 12    Date for PT Re-Evaluation 11/22/22    Authorization Type MEdicaid    Authorization Time Period 3 units approved 1/29 thru 2/11    Authorization - Visit Number 1    Authorization - Number of Visits 3    Progress Note Due on Visit 4    PT Start Time 1526    PT Stop Time 1605    PT Time Calculation (min) 39 min    Equipment Utilized During Treatment Gait belt    Activity Tolerance Patient tolerated treatment well;Patient limited by fatigue    Behavior During Therapy WFL for tasks assessed/performed             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HCC)    right, s/p replacement, due to prednisone   Bipolar disorder (HCC)    Colostomy in place (HCC)    Crohn's colitis (HCC)    s/p total colectomy with ileostomy in 2009   Crohn's disease of small intestine [facility]) Sept 2012   ileoscopy: multiple ulcers likely secondary to Crohn's   DEEP VENOUS THROMBOPHLEBITIS, HX OF 02/11/2009   Qualifier: Diagnosis of  By: Diana Eves     Depression    DVT (deep venous thrombosis) (HCC) 2009   right upper extremity due to PICC   Enterococcus UTI 2009   GERD (gastroesophageal reflux disease)    Hernia    Hyperlipidemia    IDA (iron deficiency anemia) 06/17/2022   Insomnia    Migraine headache    Nystagmus    PULMONARY EMBOLISM, HX OF 02/11/2009   Qualifier: Diagnosis of  By: Diana Eves     S/P endoscopy Sept 2012   mild gastritis, small hiatal hernia, no ulcers   Schizophrenia (HCC)    Schizophrenia, acute (HCC)    Past Surgical History:  Procedure Laterality Date   APPENDECTOMY     CYSTOSCOPY WITH DIRECT VISION INTERNAL URETHROTOMY N/A 03/25/2022   Procedure:  CYSTOSCOPY WITH DIRECT VISION INTERNAL URETHROTOMY;  Surgeon: Malen Gauze, MD;  Location: [facility];  Service: Urology;  Laterality: N/A;   ESOPHAGOGASTRODUODENOSCOPY  07/2010   gastritis,  bx neg for H.Pylori   ESOPHAGOGASTRODUODENOSCOPY N/A 01/08/2014   SLF:NO SOURCE FOR ODYNOPHAGIA IDENTIFIED/Multiple small ulcers in the gastric antrum   ESOPHAGOGASTRODUODENOSCOPY (EGD) WITH PROPOFOL N/A 09/07/2022   Procedure: ESOPHAGOGASTRODUODENOSCOPY (EGD) WITH PROPOFOL;  Surgeon: Dolores Frame, MD;  Location[facility];  Service: Gastroenterology;  Laterality: N/A;   EYE SURGERY     HOT HEMOSTASIS  09/07/2022   Procedure: HOT HEMOSTASIS (ARGON PLASMA COAGULATION/BICAP);  Surgeon: Marguerita Merles, Reuel Boom, MD;  Location: [facility];  Service: Gastroenterology;;   ILEOSCOPY  06/29/2011   SLF: ulcers,multiple/small HH/mild gastritis   KIDNEY SURGERY     LAPAROTOMY N/A 06/05/2013   Procedure: EXPLORATORY LAPAROTOMY;  Surgeon: Fabio Bering, MD;  Location: [facility];  Service: General;  Laterality: N/A;   LYSIS OF ADHESION N/A 06/05/2013   Procedure: LYSIS OF ADHESIONS;  Surgeon: Fabio Bering, MD;  Location: [facility];  Service: General;  Laterality: N/A;   small bowel capsule study  08/2010   few tiny nonbleeding erosions/ulcers ?  secondary to relafen or Crohn's. Entire small bowel not seen.    TOTAL COLECTOMY  2009   with ileostomy at [facility] for refractory disease    TOTAL HIP ARTHROPLASTY     right, due to avascular necrosis from chronic prednisone   TOTAL SHOULDER REPLACEMENT     bilateral   TRANSURETHRAL RESECTION OF PROSTATE N/A 12/03/2019   Procedure: TRANSURETHRAL RESECTION OF THE PROSTATE (TURP);  Surgeon: Malen Gauze, MD;  Location: [facility];  Service: Urology;  Laterality: N/A;   Patient Active Problem List   Diagnosis Date Noted   Pancytopenia (HCC) 09/07/2022   AKI (acute kidney injury) (HCC) 09/07/2022   Hypoalbuminemia due to protein-calorie malnutrition (HCC)  09/07/2022   History of DVT (deep vein thrombosis) 09/07/2022   History of pulmonary embolism 09/07/2022   GI bleed 09/06/2022   Iron deficiency anemia 06/17/2022   Bladder neck contracture 03/25/2022   Acute metabolic encephalopathy 05/16/2021   Hyponatremia 05/16/2021   DM type 2 (diabetes mellitus, type 2) (HCC) 05/16/2021   Diarrhea 09/04/2020   Abdominal pain 09/04/2020   Recurrent deep vein thrombosis (DVT) (HCC) 07/28/2020   At risk for osteopenia 02/06/2020   BPH (benign prostatic hyperplasia) 12/03/2019   Incomplete emptying of bladder 10/31/2019   Benign prostatic hyperplasia with urinary obstruction 10/31/2019   Falls, initial encounter 06/06/2019   Lactic acidosis 02/10/2018   Partial small bowel obstruction (HCC) 02/10/2018   Acute lower UTI 12/31/2017   SBO (small bowel obstruction) (HCC) 12/29/2017   Arthritis of knee, degenerative 02/14/2013   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 03/29/2012   Crohn's disease of both small and large intestine with complication (HCC) 07/19/2011   Hepatic steatosis 07/19/2011   Hyperlipidemia 02/11/2009   BIPOLAR AFFECTIVE DISORDER 02/11/2009   Nystagmus 02/11/2009   ASTHMA 02/11/2009   GERD 02/11/2009   Aseptic necrosis of bone (HCC) 02/11/2009   INSOMNIA 02/11/2009    PCP: Smith Robert MA  REFERRING PROVIDER: Maurilio Lovely D, DO  REFERRING DIAG: [phone].Lorne Skeens (ICD-10-CM) - Falls, initial encounter  THERAPY DIAG:  History of falling  Muscle weakness (generalized)  Rationale for Evaluation and Treatment: Rehabilitation  ONSET DATE: 10/2021  SUBJECTIVE:   SUBJECTIVE STATEMENT:Pt states that he was kept in bed at the hospital and now his bottom bothers him.  He is doing well with the exercises that he was given.   PERTINENT HISTORY: DM, falls, Bipolar, CVA, Rt THA, RA  PAIN:  Are you having pain? Yes: NPRS scale: 8/10 Pain location: bilat knees Pain description: sore, aching Aggravating factors:  standing, walking Relieving factors: rest  PRECAUTIONS: Fall  WEIGHT BEARING RESTRICTIONS: No  FALLS:  Has patient fallen in last 6 months? No  LIVING ENVIRONMENT: Lives with: lives with their family and lives alone Lives in: House/apartment Stairs: No Has following equipment at home:  rollator   OCCUPATION: disability  PLOF: Independent with basic ADLs  PATIENT GOALS: Improve balance, walk better   NEXT MD VISIT:   OBJECTIVE:   DIAGNOSTIC FINDINGS: NA  COGNITION: Overall cognitive status: Within functional limits for tasks assessed   LOWER EXTREMITY ROM:  Limited ankle DF bilat, limited RT Hip flexion and knee extension   LOWER EXTREMITY MMT:  MMT Right eval Left eval  Hip flexion 4- 4-  Hip extension    Hip abduction    Hip adduction    Hip internal rotation    Hip external rotation    Knee flexion    Knee extension 3+ 4-  Ankle dorsiflexion  3- 3-  Ankle plantarflexion    Ankle inversion    Ankle eversion     (Blank rows = not tested)   FUNCTIONAL TESTS:  5 times sit to stand: 28.2 sec with UE and RW  2 minute walk test: 180 feet using Rollator (stopped due to fatigue)   GAIT: Flexed trunk, decreased stride, antalgic, using RW, fatigues quickly    TODAY'S TREATMENT:                                                                                                                              DATE:  11/01/2022 Wall arch x 10  Step up 6" step B x 10 each Functional squat x 10  Tandem stance B x 3  Side step x 3 RT  Sitting: LAQ 3 #  B x 10 Sit to stand x 10 Nustep level 2 hills 3 x 6' 10/11/22 Eval    PATIENT EDUCATION:  Education details: on Eval findings, POC and HEP  Person educated: Patient Education method: Explanation Education comprehension: verbalized understanding  HOME EXERCISE PROGRAM: Access Code: [email or web address] URL: [email or web address] Date: 10/11/2022 Prepared by: Josue Hector  Exercises - Seated Heel Toe  Raises  - 3 x daily - 7 x weekly - 2 sets - 10 reps - Seated March  - 3 x daily - 7 x weekly - 2 sets - 10 reps - Seated Long Arc Quad  - 3 x daily - 7 x weekly - 2 sets - 10 reps  ASSESSMENT:  CLINICAL IMPRESSION:Reviewed evaluation and goals with pt.  Updated HEP and progressed pt into standing activity.  Pt has noted nystagmus of both LE which affects his balance.    Patient demonstrates decreased strength, balance deficits and gait abnormalities which are negatively impacting patient ability to perform ADLs and functional mobility tasks and will continue to benefit from skilled physical therapy services to address these deficits to improve level of function with ADLs, functional mobility tasks, and reduce risk for falls.    OBJECTIVE IMPAIRMENTS: Abnormal gait, decreased activity tolerance, decreased balance, decreased mobility, decreased ROM, decreased strength, hypomobility, improper body mechanics, and pain.   ACTIVITY LIMITATIONS: carrying, lifting, bending, sitting, standing, squatting, stairs, transfers, bed mobility, and locomotion level  PARTICIPATION LIMITATIONS: meal prep, cleaning, laundry, shopping, and community activity  PERSONAL FACTORS: Past/current experiences, Time since onset of injury/illness/exacerbation, and 3+ comorbidities: See above  are also affecting patient's functional outcome.   REHAB POTENTIAL: Fair See above  CLINICAL DECISION MAKING: Stable/uncomplicated  EVALUATION COMPLEXITY: Low   GOALS: SHORT TERM GOALS: Target date: 11/01/2022  Patient will be independent with initial HEP and self-management strategies to improve functional outcomes Baseline:  Goal status: IN PROGRESS   LONG TERM GOALS: Target date: 11/22/2022  Patient will be independent with advanced HEP and self-management strategies to improve functional outcomes Baseline:  Goal status: IN PROGRESS  2. Patient will be able to perform stand x 5 in < 15 seconds  to demonstrate improvement  in functional mobility and reduced risk for falls.  Baseline: 28.2 sec using UE  Goal status:in progress  3. Patient will have equal to or > 4/5 MMT throughout BLE to improve ability to perform functional mobility, stair ambulation and ADLs.  Baseline: See MMT Goal status: IN PROGRESS  4. Patient will be able to ambulate at least 300 feet during 2MWT with LRAD to demonstrate improved ability to perform functional mobility and associated tasks. Baseline: 180 using rollator Goal status: IN PROGRESS  PLAN:  PT FREQUENCY: 1-2x/week  PT DURATION: 6 weeks  PLANNED INTERVENTIONS: Therapeutic exercises, Therapeutic activity, Neuromuscular re-education, Balance training, Gait training, Patient/Family education, Joint manipulation, Joint mobilization, Stair training, Aquatic Therapy, Dry Needling, Electrical stimulation, Spinal manipulation, Spinal mobilization, Cryotherapy, Moist heat, scar mobilization, Taping, Traction, Ultrasound, Biofeedback, Ionotophoresis 4mg /ml Dexamethasone, and Manual therapy.   PLAN FOR NEXT SESSION: Progress LE strength and balance as tolerated. Balance and gait as able.  Rayetta Humphrey, [place] CLT [phone]  [phone]

## 2022-11-02 ENCOUNTER — Inpatient Hospital Stay: Payer: Medicaid Other

## 2022-11-02 VITALS — BP 101/53 | HR 61 | Temp 97.8°F | Resp 18

## 2022-11-02 DIAGNOSIS — D500 Iron deficiency anemia secondary to blood loss (chronic): Secondary | ICD-10-CM | POA: Diagnosis not present

## 2022-11-02 MED ORDER — SODIUM CHLORIDE 0.9 % IV SOLN: Freq: Once | INTRAVENOUS | Status: AC

## 2022-11-02 MED ORDER — SODIUM CHLORIDE 0.9% FLUSH: 3.0000 mL | Freq: Once | INTRAVENOUS | Status: DC | PRN

## 2022-11-02 MED ORDER — DIPHENHYDRAMINE HCL 50 MG/ML IJ SOLN: 50.0000 mg | Freq: Once | INTRAMUSCULAR | Status: DC | PRN

## 2022-11-02 MED ORDER — METHYLPREDNISOLONE SODIUM SUCC 125 MG IJ SOLR
125.0000 mg | Freq: Once | INTRAMUSCULAR | Status: AC
  Administered 2022-11-02: 125 mg via INTRAVENOUS
  Filled 2022-11-02: qty 2

## 2022-11-02 MED ORDER — SODIUM CHLORIDE 0.9 % IV SOLN: Freq: Once | INTRAVENOUS | Status: DC | PRN

## 2022-11-02 MED ORDER — ACETAMINOPHEN 325 MG PO TABS
650.0000 mg | ORAL_TABLET | Freq: Once | ORAL | Status: AC
  Administered 2022-11-02: 650 mg via ORAL
  Filled 2022-11-02: qty 2

## 2022-11-02 MED ORDER — EPINEPHRINE 0.3 MG/0.3ML IJ SOAJ: 0.3000 mg | Freq: Once | INTRAMUSCULAR | Status: DC | PRN

## 2022-11-02 MED ORDER — FAMOTIDINE IN NACL 20-0.9 MG/50ML-% IV SOLN: 20.0000 mg | Freq: Once | INTRAVENOUS | Status: DC | PRN

## 2022-11-02 MED ORDER — METHYLPREDNISOLONE SODIUM SUCC 125 MG IJ SOLR: 125.0000 mg | Freq: Once | INTRAMUSCULAR | Status: DC | PRN

## 2022-11-02 MED ORDER — CETIRIZINE HCL 10 MG PO TABS
10.0000 mg | ORAL_TABLET | Freq: Every day | ORAL | Status: DC
  Administered 2022-11-02: 10 mg via ORAL
  Filled 2022-11-02: qty 1

## 2022-11-02 MED ORDER — SODIUM CHLORIDE 0.9% FLUSH: 10.0000 mL | Freq: Once | INTRAVENOUS | Status: DC | PRN

## 2022-11-02 MED ORDER — FAMOTIDINE IN NACL 20-0.9 MG/50ML-% IV SOLN
20.0000 mg | Freq: Once | INTRAVENOUS | Status: AC
  Administered 2022-11-02: 20 mg via INTRAVENOUS
  Filled 2022-11-02: qty 50

## 2022-11-02 MED ORDER — SODIUM CHLORIDE 0.9 % IV SOLN
300.0000 mg | Freq: Once | INTRAVENOUS | Status: AC
  Administered 2022-11-02: 300 mg via INTRAVENOUS
  Filled 2022-11-02: qty 300

## 2022-11-02 MED ORDER — HEPARIN SOD (PORK) LOCK FLUSH 100 UNIT/ML IV SOLN: 250.0000 [IU] | Freq: Once | INTRAVENOUS | Status: DC | PRN

## 2022-11-02 MED ORDER — ALTEPLASE 2 MG IJ SOLR: 2.0000 mg | Freq: Once | INTRAMUSCULAR | Status: DC | PRN

## 2022-11-02 MED ORDER — HEPARIN SOD (PORK) LOCK FLUSH 100 UNIT/ML IV SOLN: 500.0000 [IU] | Freq: Once | INTRAVENOUS | Status: DC | PRN

## 2022-11-02 MED ORDER — LORATADINE 10 MG PO TABS: 10.0000 mg | ORAL_TABLET | Freq: Once | ORAL | Status: DC

## 2022-11-02 MED ORDER — ALBUTEROL SULFATE HFA 108 (90 BASE) MCG/ACT IN AERS: 2.0000 | INHALATION_SPRAY | Freq: Once | RESPIRATORY_TRACT | Status: DC | PRN

## 2022-11-02 NOTE — Progress Notes (Signed)
Patient presents today for iron infusion.  Patient is in satisfactory condition with no new complaints voiced.  Vital signs are stable.  We will proceed with infusion per provider orders.   Patient tolerated treatment well with no complaints voiced.  Patient left ambulatory with rolling walker in stable condition.  Vital signs stable at discharge.  Follow up as scheduled.

## 2022-11-02 NOTE — Patient Instructions (Signed)
[facility]   Discharge Instructions: Thank you for choosing [facility] to provide your oncology and hematology care.  If you have a lab appointment with the Cancer Center, please come in thru the Main Entrance and check in at the main information desk.  Wear comfortable clothing and clothing appropriate for easy access to any Portacath or PICC line.   We strive to give you quality time with your provider. You may need to reschedule your appointment if you arrive late (15 or more minutes).  Arriving late affects you and other patients whose appointments are after yours.  Also, if you miss three or more appointments without notifying the office, you may be dismissed from the clinic at the provider's discretion.      For prescription refill requests, have your pharmacy contact our office and allow 72 hours for refills to be completed.    Iron Sucrose Injection What is this medication? IRON SUCROSE (EYE ern SOO krose) treats low levels of iron (iron deficiency anemia) in people with kidney disease. Iron is a mineral that plays an important role in making red blood cells, which carry oxygen from your lungs to the rest of your body. This medicine may be used for other purposes; ask your health care provider or pharmacist if you have questions. COMMON BRAND NAME(S): Venofer What should I tell my care team before I take this medication? They need to know if you have any of these conditions: Anemia not caused by low iron levels Heart disease High levels of iron in the blood Kidney disease Liver disease An unusual or allergic reaction to iron, other medications, foods, dyes, or preservatives Pregnant or trying to get pregnant Breastfeeding How should I use this medication? This medication is for infusion into a vein. It is given in a hospital or clinic setting. Talk to your care team about the use of this medication in children. While this medication may be  prescribed for children as young as 2 years for selected conditions, precautions do apply. Overdosage: If you think you have taken too much of this medicine contact a poison control center or emergency room at once. NOTE: This medicine is only for you. Do not share this medicine with others. What if I miss a dose? Keep appointments for follow-up doses. It is important not to miss your dose. Call your care team if you are unable to keep an appointment. What may interact with this medication? Do not take this medication with any of the following: Deferoxamine Dimercaprol Other iron products This medication may also interact with the following: Chloramphenicol Deferasirox This list may not describe all possible interactions. Give your health care provider a list of all the medicines, herbs, non-prescription drugs, or dietary supplements you use. Also tell them if you smoke, drink alcohol, or use illegal drugs. Some items may interact with your medicine. What should I watch for while using this medication? Visit your care team regularly. Tell your care team if your symptoms do not start to get better or if they get worse. You may need blood work done while you are taking this medication. You may need to follow a special diet. Talk to your care team. Foods that contain iron include: whole grains/cereals, dried fruits, beans, or peas, leafy green vegetables, and organ meats (liver, kidney). What side effects may I notice from receiving this medication? Side effects that you should report to your care team as soon as possible: Allergic reactions--skin rash, itching, hives,   swelling of the face, lips, tongue, or throat Low blood pressure--dizziness, feeling faint or lightheaded, blurry vision Shortness of breath Side effects that usually do not require medical attention (report to your care team if they continue or are bothersome): Flushing Headache Joint pain Muscle pain Nausea Pain, redness, or  irritation at injection site This list may not describe all possible side effects. Call your doctor for medical advice about side effects. You may report side effects to FDA at 1-800-FDA-1088. Where should I keep my medication? This medication is given in a hospital or clinic and will not be stored at home. NOTE: This sheet is a summary. It may not cover all possible information. If you have questions about this medicine, talk to your doctor, pharmacist, or health care provider.  2023 Elsevier/Gold Standard (2021-01-01 00:00:00)    To help prevent nausea and vomiting after your treatment, we encourage you to take your nausea medication as directed.  BELOW ARE SYMPTOMS THAT SHOULD BE REPORTED IMMEDIATELY: *FEVER GREATER THAN 100.4 F (38 C) OR HIGHER *CHILLS OR SWEATING *NAUSEA AND VOMITING THAT IS NOT CONTROLLED WITH YOUR NAUSEA MEDICATION *UNUSUAL SHORTNESS OF BREATH *UNUSUAL BRUISING OR BLEEDING *URINARY PROBLEMS (pain or burning when urinating, or frequent urination) *BOWEL PROBLEMS (unusual diarrhea, constipation, pain near the anus) TENDERNESS IN MOUTH AND THROAT WITH OR WITHOUT PRESENCE OF ULCERS (sore throat, sores in mouth, or a toothache) UNUSUAL RASH, SWELLING OR PAIN  UNUSUAL VAGINAL DISCHARGE OR ITCHING   Items with * indicate a potential emergency and should be followed up as soon as possible or go to the Emergency Department if any problems should occur.  Please show the CHEMOTHERAPY ALERT CARD or IMMUNOTHERAPY ALERT CARD at check-in to the Emergency Department and triage nurse.  Should you have questions after your visit or need to cancel or reschedule your appointment, please contact [facility] [phone]  and follow the prompts.  Office hours are 8:00 a.m. to 4:30 p.m. Monday - Friday. Please note that voicemails left after 4:00 p.m. may not be returned until the following business day.  We are closed weekends and major holidays. You have access to  a nurse at all times for urgent questions. Please call the main number to the clinic [phone] and follow the prompts.  For any non-urgent questions, you may also contact your provider using MyChart. We now offer e-Visits for anyone 18 and older to request care online for non-urgent symptoms. For details visit [email or web address].   Also download the MyChart app! Go to the app store, search "MyChart", open the app, select [facility], and log in with your MyChart username and password.   

## 2022-11-04 ENCOUNTER — Other Ambulatory Visit: Payer: Self-pay

## 2022-11-04 ENCOUNTER — Ambulatory Visit (HOSPITAL_COMMUNITY): Payer: Medicaid Other | Attending: Family Medicine | Admitting: Physical Therapy

## 2022-11-04 DIAGNOSIS — Z9181 History of falling: Secondary | ICD-10-CM | POA: Diagnosis present

## 2022-11-04 DIAGNOSIS — M6281 Muscle weakness (generalized): Secondary | ICD-10-CM | POA: Insufficient documentation

## 2022-11-04 NOTE — Therapy (Signed)
OUTPATIENT PHYSICAL THERAPY LOWER EXTREMITY Treatment   Patient Name: [name] MRN: [id number] DOB:[birth year], 57 y.o., male Today's Date: 11/04/2022  END OF SESSION:  PT End of Session - 11/04/22 1345     Visit Number 3    Number of Visits 12    Date for PT Re-Evaluation 11/22/22    Authorization Time Period 3 units approved 1/29 thru 2/11    Authorization - Visit Number 2    Authorization - Number of Visits 3    Progress Note Due on Visit 4    PT Start Time 1301    PT Stop Time 1339    PT Time Calculation (min) 38 min    Activity Tolerance Patient tolerated treatment well;Patient limited by fatigue    Behavior During Therapy [facility] for tasks assessed/performed               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HCC)    right, s/p replacement, due to prednisone   Bipolar disorder (HCC)    Colostomy in place (HCC)    Crohn's colitis (HCC)    s/p total colectomy with ileostomy in 2009   Crohn's disease of small intestine [facility]) Sept 2012   ileoscopy: multiple ulcers likely secondary to Crohn's   DEEP VENOUS THROMBOPHLEBITIS, HX OF 02/11/2009   Qualifier: Diagnosis of  By: Diana Eves     Depression    DVT (deep venous thrombosis) (HCC) 2009   right upper extremity due to PICC   Enterococcus UTI 2009   GERD (gastroesophageal reflux disease)    Hernia    Hyperlipidemia    IDA (iron deficiency anemia) 06/17/2022   Insomnia    Migraine headache    Nystagmus    PULMONARY EMBOLISM, HX OF 02/11/2009   Qualifier: Diagnosis of  By: Diana Eves     S/P endoscopy Sept 2012   mild gastritis, small hiatal hernia, no ulcers   Schizophrenia (HCC)    Schizophrenia, acute (HCC)    Past Surgical History:  Procedure Laterality Date   APPENDECTOMY     CYSTOSCOPY WITH DIRECT VISION INTERNAL URETHROTOMY N/A 03/25/2022   Procedure: CYSTOSCOPY WITH DIRECT VISION INTERNAL URETHROTOMY;  Surgeon: Malen Gauze, MD;  Location: [facility];  Service: Urology;  Laterality: N/A;   ESOPHAGOGASTRODUODENOSCOPY  07/2010   gastritis,  bx neg for H.Pylori   ESOPHAGOGASTRODUODENOSCOPY N/A 01/08/2014   SLF:NO SOURCE FOR ODYNOPHAGIA IDENTIFIED/Multiple small ulcers in the gastric antrum   ESOPHAGOGASTRODUODENOSCOPY (EGD) WITH PROPOFOL N/A 09/07/2022   Procedure: ESOPHAGOGASTRODUODENOSCOPY (EGD) WITH PROPOFOL;  Surgeon: Dolores Frame, MD;  Location: [facility];  Service: Gastroenterology;  Laterality: N/A;   EYE SURGERY     HOT HEMOSTASIS  09/07/2022   Procedure: HOT HEMOSTASIS (ARGON PLASMA COAGULATION/BICAP);  Surgeon: Marguerita Merles, Reuel Boom, MD;  Location: [facility];  Service: Gastroenterology;;   ILEOSCOPY  06/29/2011   SLF: ulcers,multiple/small HH/mild gastritis   KIDNEY SURGERY     LAPAROTOMY N/A 06/05/2013   Procedure: EXPLORATORY LAPAROTOMY;  Surgeon: Fabio Bering, MD;  Location: [facility];  Service: General;  Laterality: N/A;   LYSIS OF ADHESION N/A 06/05/2013   Procedure: LYSIS OF ADHESIONS;  Surgeon: Fabio Bering, MD;  Location: [facility];  Service: General;  Laterality: N/A;   small bowel capsule study  08/2010   few tiny nonbleeding erosions/ulcers ?secondary to relafen or Crohn's. Entire small bowel not seen.  TOTAL COLECTOMY  2009   with ileostomy at [facility] for refractory disease    TOTAL HIP ARTHROPLASTY     right, due to avascular necrosis from chronic prednisone   TOTAL SHOULDER REPLACEMENT     bilateral   TRANSURETHRAL RESECTION OF PROSTATE N/A 12/03/2019   Procedure: TRANSURETHRAL RESECTION OF THE PROSTATE (TURP);  Surgeon: Cleon Gustin, MD;  Location: [facility];  Service: Urology;  Laterality: N/A;   Patient Active Problem List   Diagnosis Date Noted   Pancytopenia ([place]) 09/07/2022   AKI (acute kidney injury) ([place]) 09/07/2022   Hypoalbuminemia due to protein-calorie malnutrition ([place]) 09/07/2022   History of DVT (deep vein thrombosis) 09/07/2022   History of  pulmonary embolism 09/07/2022   GI bleed 09/06/2022   Iron deficiency anemia 06/17/2022   Bladder neck contracture 67/34/1937   Acute metabolic encephalopathy 90/24/0973   Hyponatremia 05/16/2021   DM type 2 (diabetes mellitus, type 2) ([place]) 05/16/2021   Diarrhea 09/04/2020   Abdominal pain 09/04/2020   Recurrent deep vein thrombosis (DVT) ([place]) 07/28/2020   At risk for osteopenia 02/06/2020   BPH (benign prostatic hyperplasia) 12/03/2019   Incomplete emptying of bladder 10/31/2019   Benign prostatic hyperplasia with urinary obstruction 10/31/2019   Falls, initial encounter 06/06/2019   Lactic acidosis 02/10/2018   Partial small bowel obstruction ([place]) 02/10/2018   Acute lower UTI 12/31/2017   SBO (small bowel obstruction) ([place]) 12/29/2017   Arthritis of knee, degenerative 02/14/2013   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 03/29/2012   Crohn's disease of both small and large intestine with complication ([place]) 53/29/9242   Hepatic steatosis 07/19/2011   Hyperlipidemia 02/11/2009   BIPOLAR AFFECTIVE DISORDER 02/11/2009   Nystagmus 02/11/2009   ASTHMA 02/11/2009   GERD 02/11/2009   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  States that he has been doing his exercises twice a day.   Hx: DM, falls, Bipolar, CVA, Rt THA, RA  PAIN:  Are you having pain? Yes: NPRS scale: 8/10 Pain location: bilat knees Pain description: sore, aching Aggravating factors: standing, walking Relieving factors: rest  PRECAUTIONS: Fall  WEIGHT BEARING RESTRICTIONS: No  FALLS:  Has patient fallen in last 6 months?  No  LIVING ENVIRONMENT: Lives with: lives with their family and lives alone Lives in: House/apartment Stairs: No Has following equipment at home:  rollator   OCCUPATION: disability  PLOF: Independent with basic ADLs  PATIENT GOALS: Improve balance, walk better   NEXT MD VISIT:  unknown  OBJECTIVE:   DIAGNOSTIC FINDINGS: NA  COGNITION: Overall cognitive status: Within functional limits for tasks assessed   LOWER EXTREMITY ROM:  Limited ankle DF bilat, limited RT Hip flexion and knee extension   LOWER EXTREMITY MMT:  MMT Right eval Left eval  Hip flexion 4- 4-  Hip extension    Hip abduction    Hip adduction    Hip internal rotation    Hip external rotation    Knee flexion    Knee extension 3+ 4-  Ankle dorsiflexion 3- 3-  Ankle plantarflexion    Ankle inversion    Ankle eversion     (Blank rows = not tested)  FUNCTIONAL TESTS:  5 times sit to stand: 28.2 sec with UE and RW  2 minute walk test: 180 feet using Rollator (stopped due to fatigue)   GAIT: Flexed trunk, decreased stride, antalgic, using RW, fatigues quickly    TODAY'S TREATMENT:                                                                                                                              DATE:  11/04/2022 Heel raises x 10 Functional squat x 10 Marching x 10 Step up 6" step 10 x B Lunge onto 6" step x 10 B  Side step  x 3  Gt with cane x 150 ft  Tandem stance x 5 B  Sit to stand x 10 11/01/2022 Wall arch x 10  Step up 6" step B x 10 each Functional squat x 10  Tandem stance B x 3  Side step x 3 RT  Sitting: LAQ 3 #  B x 10 Sit to stand x 10 Nustep level 2 hills 3 x 6' 10/11/22 Eval    PATIENT EDUCATION:  Education details: on Eval findings, POC and HEP  Person educated: Patient Education method: Explanation Education comprehension: verbalized understanding  HOME EXERCISE PROGRAM: Access Code: [email or web address] URL: [email or web address] Date:  10/11/2022 Prepared by: Josue Hector  Exercises - Seated Heel Toe Raises  - 3 x daily - 7 x weekly - 2 sets - 10 reps - Seated March  - 3 x daily - 7 x weekly - 2 sets - 10 reps - Seated Long Arc Quad  - 3 x daily - 7 x weekly - 2 sets - 10 reps  ASSESSMENT:  CLINICAL IMPRESSION:  Pt pain remains high but has improved in technique with exercises.  Pt continues to need multiple breaks throughout treatment.   Patient demonstrates decreased strength, balance deficits and gait abnormalities which are negatively impacting patient ability to perform ADLs and functional mobility tasks and will continue to benefit from skilled physical therapy services to address these deficits to improve level of function with ADLs, functional mobility tasks, and reduce risk for falls.    OBJECTIVE IMPAIRMENTS: Abnormal gait, decreased activity tolerance, decreased balance, decreased mobility, decreased ROM, decreased strength, hypomobility, improper body mechanics, and pain.   ACTIVITY LIMITATIONS: carrying, lifting, bending, sitting, standing, squatting, stairs, transfers, bed mobility, and locomotion level  PARTICIPATION LIMITATIONS: meal prep, cleaning, laundry, shopping, and community activity  PERSONAL FACTORS: Past/current experiences, Time since onset of injury/illness/exacerbation, and 3+ comorbidities: See above  are also affecting patient's functional outcome.   REHAB POTENTIAL: Fair See above  CLINICAL DECISION MAKING: Stable/uncomplicated  EVALUATION COMPLEXITY: Low   GOALS: SHORT TERM GOALS: Target date: 11/01/2022  Patient will be independent with initial HEP and self-management strategies to improve functional outcomes Baseline:  Goal status: IN PROGRESS   LONG TERM GOALS: Target date: 11/22/2022  Patient will be independent with advanced HEP and self-management strategies to improve functional outcomes Baseline:  Goal status: IN PROGRESS  2. Patient will be able to perform stand x  5 in < 15 seconds to demonstrate improvement in functional mobility and reduced risk for falls.  Baseline: 28.2 sec using UE  Goal status:in progress  3. Patient will have equal to or > 4/5 MMT throughout BLE to improve ability to perform functional mobility, stair ambulation and ADLs.  Baseline: See MMT Goal status: IN PROGRESS  4. Patient will be able to ambulate at least 300 feet during 2MWT with LRAD to demonstrate improved ability to perform functional mobility and associated tasks. Baseline: 180 using rollator Goal status: IN PROGRESS  PLAN:  PT FREQUENCY: 1-2x/week  PT DURATION: 6 weeks  PLANNED INTERVENTIONS: Therapeutic exercises, Therapeutic activity, Neuromuscular re-education, Balance training, Gait training, Patient/Family education, Joint manipulation, Joint mobilization, Stair training, Aquatic Therapy, Dry Needling, Electrical stimulation, Spinal manipulation, Spinal mobilization, Cryotherapy, Moist heat, scar mobilization, Taping, Traction, Ultrasound, Biofeedback, Ionotophoresis 4mg /ml Dexamethasone, and Manual therapy.   PLAN FOR NEXT SESSION: Progress LE strength and balance as tolerated. Balance and gait as able.  Rayetta Humphrey, [facility] CLT [phone]

## 2022-11-05 ENCOUNTER — Ambulatory Visit: Payer: Medicaid Other

## 2022-11-08 ENCOUNTER — Emergency Department (HOSPITAL_COMMUNITY): Payer: Medicaid Other

## 2022-11-08 ENCOUNTER — Emergency Department (HOSPITAL_COMMUNITY)
Admission: EM | Admit: 2022-11-08 | Discharge: 2022-11-08 | Discharge status: Against Medical Advice | Payer: Medicaid Other | Attending: Emergency Medicine | Admitting: Emergency Medicine

## 2022-11-08 ENCOUNTER — Encounter (HOSPITAL_COMMUNITY): Payer: Medicaid Other | Admitting: Physical Therapy

## 2022-11-08 ENCOUNTER — Other Ambulatory Visit: Payer: Self-pay

## 2022-11-08 DIAGNOSIS — R531 Weakness: Secondary | ICD-10-CM | POA: Insufficient documentation

## 2022-11-08 DIAGNOSIS — M7989 Other specified soft tissue disorders: Secondary | ICD-10-CM | POA: Diagnosis present

## 2022-11-08 DIAGNOSIS — Z5321 Procedure and treatment not carried out due to patient leaving prior to being seen by health care provider: Secondary | ICD-10-CM | POA: Diagnosis not present

## 2022-11-08 LAB — CBC WITH DIFFERENTIAL/PLATELET
Abs Immature Granulocytes: 0.01 10*3/uL (ref 0.00–0.07)
Basophils Absolute: 0 10*3/uL (ref 0.0–0.1)
Basophils Relative: 1 %
Eosinophils Absolute: 0 10*3/uL (ref 0.0–0.5)
Eosinophils Relative: 1 %
HCT: 29.4 % — ABNORMAL LOW (ref 39.0–52.0)
Hemoglobin: 9.6 g/dL — ABNORMAL LOW (ref 13.0–17.0)
Immature Granulocytes: 0 %
Lymphocytes Relative: 29 %
Lymphs Abs: 1 10*3/uL (ref 0.7–4.0)
MCH: 32.5 pg (ref 26.0–34.0)
MCHC: 32.7 g/dL (ref 30.0–36.0)
MCV: 99.7 fL (ref 80.0–100.0)
Monocytes Absolute: 0.7 10*3/uL (ref 0.1–1.0)
Monocytes Relative: 20 %
Neutro Abs: 1.8 10*3/uL (ref 1.7–7.7)
Neutrophils Relative %: 49 %
Platelets: 57 10*3/uL — ABNORMAL LOW (ref 150–400)
RBC: 2.95 MIL/uL — ABNORMAL LOW (ref 4.22–5.81)
RDW: 17.2 % — ABNORMAL HIGH (ref 11.5–15.5)
WBC: 3.6 10*3/uL — ABNORMAL LOW (ref 4.0–10.5)
nRBC: 0 % (ref 0.0–0.2)

## 2022-11-08 LAB — COMPREHENSIVE METABOLIC PANEL
ALT: 23 U/L (ref 0–44)
AST: 44 U/L — ABNORMAL HIGH (ref 15–41)
Albumin: 2 g/dL — ABNORMAL LOW (ref 3.5–5.0)
Alkaline Phosphatase: 113 U/L (ref 38–126)
Anion gap: 6 (ref 5–15)
BUN: 6 mg/dL (ref 6–20)
CO2: 28 mmol/L (ref 22–32)
Calcium: 7.8 mg/dL — ABNORMAL LOW (ref 8.9–10.3)
Chloride: 96 mmol/L — ABNORMAL LOW (ref 98–111)
Creatinine, Ser: 0.92 mg/dL (ref 0.61–1.24)
GFR, Estimated: >60 mL/min (ref >60)
Glucose, Bld: 91 mg/dL (ref 70–99)
Potassium: 3.6 mmol/L (ref 3.5–5.1)
Sodium: 130 mmol/L — ABNORMAL LOW (ref 135–145)
Total Bilirubin: 1.2 mg/dL (ref 0.3–1.2)
Total Protein: 5.8 g/dL — ABNORMAL LOW (ref 6.5–8.1)

## 2022-11-08 NOTE — ED Notes (Signed)
Pt not in waiting area 

## 2022-11-08 NOTE — ED Notes (Signed)
Pt not in waiting area x 2 

## 2022-11-08 NOTE — ED Notes (Signed)
[place] ordered by Dr. Gilford Raid. Pt has LWBS. This RN called pt, and he did not want to wait in the [facility] any longer, went home. Advised to return for worsening symptoms.

## 2022-11-08 NOTE — ED Triage Notes (Signed)
Pt in with bilateral leg swelling, warmth and redness x 2 days. Arrives with [facility] nurse who also reports increased changes from his norm. Pt states he fell due to weakness 2 days ago, also notes he first noticed leg swelling that day as well. Denies any cp or injuries to fall that day. Does take Eliquis, no missed doses. Pt states he went to [facility] for eval this am and they sent him here today.

## 2022-11-08 NOTE — ED Notes (Signed)
Pt not in waiting area x 3 

## 2022-11-08 NOTE — ED Notes (Signed)
This RN asked Dr. Roderic Palau if US doppler necessary at this time, as remains in waiting room. No new orders at this time.

## 2022-11-09 ENCOUNTER — Inpatient Hospital Stay: Payer: Medicaid Other

## 2022-11-10 ENCOUNTER — Inpatient Hospital Stay: Payer: Medicaid Other

## 2022-11-10 ENCOUNTER — Encounter: Payer: Self-pay | Admitting: Internal Medicine

## 2022-11-10 MED ORDER — TORSEMIDE 20 MG PO TABS: 20.0000 mg | ORAL_TABLET | Freq: Every day | ORAL | 1 refills | Status: DC

## 2022-11-11 ENCOUNTER — Ambulatory Visit (HOSPITAL_COMMUNITY): Payer: Medicaid Other | Admitting: Physical Therapy

## 2022-11-11 DIAGNOSIS — Z9181 History of falling: Secondary | ICD-10-CM

## 2022-11-11 DIAGNOSIS — M6281 Muscle weakness (generalized): Secondary | ICD-10-CM

## 2022-11-11 NOTE — Therapy (Signed)
OUTPATIENT PHYSICAL THERAPY LOWER EXTREMITY Treatment   Patient Name: [name] MRN: [id number] DOB:[birth year], 57 y.o., male Today's Date: 11/11/2022  Progress Note Reporting Period 10/11/22 to 11/11/22  See note below for Objective Data and Assessment of Progress/Goals.     END OF SESSION: END OF SESSION:   PT End of Session - 11/11/22 1355     Visit Number 4    Number of Visits 12    Date for PT Re-Evaluation 12/16/22    Authorization Type Medicaid    Authorization Time Period 3 units approved 1/29 thru 2/11 (applied for 8 more visits 11/11/22 check auth)    Authorization - Visit Number 3    Authorization - Number of Visits 3    Progress Note Due on Visit 4    PT Start Time 4696    PT Stop Time 1423    PT Time Calculation (min) 32 min    Activity Tolerance Patient tolerated treatment well;Patient limited by fatigue    Behavior During Therapy WFL for tasks assessed/performed              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[place])    right, s/p replacement, due to prednisone   Bipolar disorder ([place])    Colostomy in place ([place])    Crohn's colitis ([place])    s/p total colectomy with ileostomy in 2009   Crohn's disease of small intestine [facility]) Sept 2012   ileoscopy: multiple ulcers likely secondary to [place], HX OF 02/11/2009   Qualifier: Diagnosis of  By: Zeb Comfort     Depression    DVT (deep venous thrombosis) ([place]) 2009   right upper extremity due to PICC   Enterococcus UTI 2009   GERD (gastroesophageal reflux disease)    Hernia    Hyperlipidemia    IDA (iron deficiency anemia) 06/17/2022   Insomnia    Migraine headache    Nystagmus    PULMONARY EMBOLISM, HX OF 02/11/2009   Qualifier: Diagnosis of  By: Zeb Comfort     S/P endoscopy Sept 2012   mild gastritis, small hiatal hernia, no ulcers   Schizophrenia ([place])    Schizophrenia, acute ([place])    Past  Surgical History:  Procedure Laterality Date   APPENDECTOMY     CYSTOSCOPY WITH DIRECT VISION INTERNAL URETHROTOMY N/A 03/25/2022   Procedure: CYSTOSCOPY WITH DIRECT VISION INTERNAL URETHROTOMY;  Surgeon: Cleon Gustin, MD;  Location: [facility] ORS;  Service: Urology;  Laterality: N/A;   ESOPHAGOGASTRODUODENOSCOPY  07/2010   gastritis,  bx neg for H.Pylori   ESOPHAGOGASTRODUODENOSCOPY N/A 01/08/2014   SLF:NO SOURCE FOR ODYNOPHAGIA IDENTIFIED/Multiple small ulcers in the gastric antrum   ESOPHAGOGASTRODUODENOSCOPY (EGD) WITH PROPOFOL N/A 09/07/2022   Procedure: ESOPHAGOGASTRODUODENOSCOPY (EGD) WITH PROPOFOL;  Surgeon: Harvel Quale, MD;  Location: [facility] END[facility] SU[facility];  Service: Gastroenterology;  Laterality: N/A;   EYE SURGERY     HOT HEMOSTASIS  09/07/2022   Procedure: HOT HEMOSTASIS (ARGON PLASMA COAGULATION/BICAP);  Surgeon: Montez Morita, Quillian Quince, MD;  Location: [facility] END[facility] SU[facility];  Service: Gastroenterology;;   ILEOSCOPY  06/29/2011   SLF: ulcers,multiple/small HH/mild gastritis   KIDNEY SURGERY     LAPAROTOMY N/A 06/05/2013   Procedure: EXPLORATORY LAPAROTOMY;  Surgeon: Donato Heinz, MD;  Location: [facility] ORS;  Service: General;  Laterality: N/A;   LYSIS OF ADHESION N/A 06/05/2013   Procedure: LYSIS OF ADHESIONS;  Surgeon: Ruby Cola  Dondra Prader, MD;  Location: [facility] ORS;  Service: General;  Laterality: N/A;   small bowel capsule study  08/2010   few tiny nonbleeding erosions/ulcers ?secondary to relafen or Crohn's. Entire small bowel not seen.    TOTAL COLECTOMY  2009   with ileostomy at [facility] for refractory disease    TOTAL HIP ARTHROPLASTY     right, due to avascular necrosis from chronic prednisone   TOTAL SHOULDER REPLACEMENT     bilateral   TRANSURETHRAL RESECTION OF PROSTATE N/A 12/03/2019   Procedure: TRANSURETHRAL RESECTION OF THE PROSTATE (TURP);  Surgeon: Cleon Gustin, MD;  Location: [facility] OR[facility];  Service: Urology;  Laterality: N/A;   Patient Active Problem List   Diagnosis Date  Noted   Pancytopenia ([place]) 09/07/2022   AKI (acute kidney injury) ([place]) 09/07/2022   Hypoalbuminemia due to protein-calorie malnutrition ([place]) 09/07/2022   History of DVT (deep vein thrombosis) 09/07/2022   History of pulmonary embolism 09/07/2022   GI bleed 09/06/2022   Iron deficiency anemia 06/17/2022   Bladder neck contracture 50/06/3817   Acute metabolic encephalopathy 29/93/7169   Hyponatremia 05/16/2021   DM type 2 (diabetes mellitus, type 2) ([place]) 05/16/2021   Diarrhea 09/04/2020   Abdominal pain 09/04/2020   Recurrent deep vein thrombosis (DVT) ([place]) 07/28/2020   At risk for osteopenia 02/06/2020   BPH (benign prostatic hyperplasia) 12/03/2019   Incomplete emptying of bladder 10/31/2019   Benign prostatic hyperplasia with urinary obstruction 10/31/2019   Falls, initial encounter 06/06/2019   Lactic acidosis 02/10/2018   Partial small bowel obstruction ([place]) 02/10/2018   Acute lower UTI 12/31/2017   SBO (small bowel obstruction) ([place]) 12/29/2017   Arthritis of knee, degenerative 02/14/2013   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 03/29/2012   Crohn's disease of both small and large intestine with complication ([place]) 67/89/3810   Hepatic steatosis 07/19/2011   Hyperlipidemia 02/11/2009   BIPOLAR AFFECTIVE DISORDER 02/11/2009   Nystagmus 02/11/2009   ASTHMA 02/11/2009   GERD 02/11/2009   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 Feels he is reliant on his walker. He had to call his friends to help him up. He was sick recently with a UTI. He feels weak and off balance.    Hx: DM, falls,  Bipolar, CVA, Rt THA, RA  PAIN:  Are you having pain? Yes: NPRS scale: 10/10 Pain location: bilat knees Pain description: sore, aching Aggravating factors: standing, walking Relieving factors: rest  PRECAUTIONS: Fall  WEIGHT BEARING RESTRICTIONS: No  FALLS:  Has patient fallen in last 6 months? No  LIVING ENVIRONMENT: Lives with: lives with their family and lives alone Lives in: House/apartment Stairs: No Has following equipment at home:  rollator   OCCUPATION: disability  PLOF: Independent with basic ADLs  PATIENT GOALS: Improve balance, walk better   NEXT MD VISIT:  unknown  OBJECTIVE:   DIAGNOSTIC FINDINGS: NA  COGNITION: Overall cognitive status: Within functional limits for tasks assessed   LOWER EXTREMITY ROM:  Limited ankle DF bilat, limited RT Hip flexion and knee extension   LOWER EXTREMITY MMT:  MMT Right eval Left eval Right 11/11/22 Left 11/11/22  Hip flexion 4- 4- 4 4  Hip extension      Hip abduction      Hip adduction      Hip internal rotation      Hip external rotation      Knee flexion      Knee extension 3+ 4- 4 4  Ankle dorsiflexion 3- 3- 4 4-  Ankle plantarflexion      Ankle inversion      Ankle eversion       (Blank rows = not tested)   FUNCTIONAL TESTS:  5 times sit to stand: 16.5 sec using UE (was 28.2 sec with UE and RW)  2 minute walk test: 226 feet with RW (was 180 feet using Rollator (stopped due to fatigue))  GAIT: Flexed trunk, decreased stride, antalgic, using RW, fatigues quickly    TODAY'S TREATMENT:                                                                                                                              DATE:  11/11/22 Reassess STS x 5  MMT  HEP review   11/04/2022 Heel raises x 10 Functional squat x 10 Marching x 10 Step up 6" step 10 x B Lunge onto 6" step x 10 B  Side step  x 3  Gt with cane x 150 ft  Tandem stance x 5 B  Sit to stand x 10 11/01/2022 Wall arch x 10  Step up  6" step B x 10 each Functional squat x 10  Tandem stance B x 3  Side step x 3 RT  Sitting: LAQ 3 #  B x 10 Sit to stand x 10 Nustep level 2 hills 3 x 6' 10/11/22 Eval    PATIENT EDUCATION:  Education details: on Eval findings, POC and HEP  Person educated: Patient Education method: Explanation Education comprehension: verbalized understanding  HOME EXERCISE PROGRAM: Access Code: [email or web address] URL: [email or web address] Date: 10/11/2022 Prepared by: Georges Lynch  Exercises - Seated Heel Toe Raises  - 3 x daily - 7 x weekly - 2 sets - 10 reps - Seated March  - 3 x daily - 7 x weekly - 2 sets - 10 reps - Seated Long Arc Quad  - 3 x daily - 7 x weekly - 2 sets - 10 reps  ASSESSMENT:  CLINICAL IMPRESSION:  Patient showing steady progress to therapy goals. Improved LE strength and functional transfers demonstrated. Patient also able to ambulate longer distance, but remains limited by fatigue/ reduced activity tolerance. Patient with ongoing functional weakness in RLE and notable gait deviations which continue to negatively impact functional mobility. Patient will continue to benefit from skilled therapy services to address remaining deficits for improved functional mobility and reduced risk for future falls.    OBJECTIVE IMPAIRMENTS: Abnormal gait, decreased activity tolerance, decreased balance, decreased mobility, decreased ROM, decreased strength, hypomobility, improper body mechanics, and pain.   ACTIVITY LIMITATIONS: carrying, lifting, bending, sitting, standing, squatting, stairs, transfers, bed mobility, and  locomotion level  PARTICIPATION LIMITATIONS: meal prep, cleaning, laundry, shopping, and community activity  PERSONAL FACTORS: Past/current experiences, Time since onset of injury/illness/exacerbation, and 3+ comorbidities: See above  are also affecting patient's functional outcome.   REHAB POTENTIAL: Fair See above  CLINICAL DECISION MAKING:  Stable/uncomplicated  EVALUATION COMPLEXITY: Low   GOALS: SHORT TERM GOALS: Target date: 11/01/2022  Patient will be independent with initial HEP and self-management strategies to improve functional outcomes Baseline:  Goal status: MET   LONG TERM GOALS: Target date: 11/22/2022  Patient will be independent with advanced HEP and self-management strategies to improve functional outcomes Baseline:  Goal status: IN PROGRESS  2. Patient will be able to perform stand x 5 in < 15 seconds to demonstrate improvement in functional mobility and reduced risk for falls.  Baseline: 16.5 sec using UE  Goal status: IN PROGRESS  3. Patient will have equal to or > 4/5 MMT throughout BLE to improve ability to perform functional mobility, stair ambulation and ADLs.  Baseline: See MMT Goal status: IN PROGRESS  4. Patient will be able to ambulate at least 300 feet during 2MWT with LRAD to demonstrate improved ability to perform functional mobility and associated tasks. Baseline: 226 feet with RW Goal status: IN PROGRESS  PLAN:  PT FREQUENCY: 2x/week  PT DURATION: 4 weeks  PLANNED INTERVENTIONS: Therapeutic exercises, Therapeutic activity, Neuromuscular re-education, Balance training, Gait training, Patient/Family education, Joint manipulation, Joint mobilization, Stair training, Aquatic Therapy, Dry Needling, Electrical stimulation, Spinal manipulation, Spinal mobilization, Cryotherapy, Moist heat, scar mobilization, Taping, Traction, Ultrasound, Biofeedback, Ionotophoresis 4mg /ml Dexamethasone, and Manual therapy.   PLAN FOR NEXT SESSION: Progress LE strength and balance as tolerated. Balance and gait as able.   2:23 PM, 11/11/22 Josue Hector PT DPT  Physical Therapist with [facility]  [phone]

## 2022-11-15 ENCOUNTER — Inpatient Hospital Stay (HOSPITAL_COMMUNITY)
Admission: EM | Admit: 2022-11-15 | Discharge: 2022-12-01 | DRG: 871 | Disposition: A | Discharge status: Hospice | Payer: Medicaid Other | Attending: Family Medicine | Admitting: Family Medicine

## 2022-11-15 ENCOUNTER — Other Ambulatory Visit: Payer: Self-pay

## 2022-11-15 ENCOUNTER — Emergency Department (HOSPITAL_COMMUNITY): Payer: Medicaid Other

## 2022-11-15 ENCOUNTER — Encounter (HOSPITAL_COMMUNITY): Payer: Self-pay | Admitting: Emergency Medicine

## 2022-11-15 DIAGNOSIS — M069 Rheumatoid arthritis, unspecified: Secondary | ICD-10-CM | POA: Diagnosis present

## 2022-11-15 DIAGNOSIS — K7581 Nonalcoholic steatohepatitis (NASH): Secondary | ICD-10-CM | POA: Diagnosis not present

## 2022-11-15 DIAGNOSIS — G9341 Metabolic encephalopathy: Secondary | ICD-10-CM | POA: Diagnosis present

## 2022-11-15 DIAGNOSIS — E46 Unspecified protein-calorie malnutrition: Secondary | ICD-10-CM | POA: Diagnosis present

## 2022-11-15 DIAGNOSIS — T83021A Displacement of indwelling urethral catheter, initial encounter: Secondary | ICD-10-CM | POA: Diagnosis present

## 2022-11-15 DIAGNOSIS — Z8249 Family history of ischemic heart disease and other diseases of the circulatory system: Secondary | ICD-10-CM

## 2022-11-15 DIAGNOSIS — K7460 Unspecified cirrhosis of liver: Secondary | ICD-10-CM

## 2022-11-15 DIAGNOSIS — Z888 Allergy status to other drugs, medicaments and biological substances status: Secondary | ICD-10-CM

## 2022-11-15 DIAGNOSIS — F17210 Nicotine dependence, cigarettes, uncomplicated: Secondary | ICD-10-CM | POA: Diagnosis present

## 2022-11-15 DIAGNOSIS — E860 Dehydration: Secondary | ICD-10-CM | POA: Diagnosis present

## 2022-11-15 DIAGNOSIS — M1990 Unspecified osteoarthritis, unspecified site: Secondary | ICD-10-CM | POA: Diagnosis present

## 2022-11-15 DIAGNOSIS — K50819 Crohn's disease of both small and large intestine with unspecified complications: Secondary | ICD-10-CM | POA: Diagnosis present

## 2022-11-15 DIAGNOSIS — N179 Acute kidney failure, unspecified: Secondary | ICD-10-CM | POA: Diagnosis not present

## 2022-11-15 DIAGNOSIS — Z880 Allergy status to penicillin: Secondary | ICD-10-CM

## 2022-11-15 DIAGNOSIS — Z79899 Other long term (current) drug therapy: Secondary | ICD-10-CM

## 2022-11-15 DIAGNOSIS — E875 Hyperkalemia: Secondary | ICD-10-CM | POA: Diagnosis present

## 2022-11-15 DIAGNOSIS — Z96612 Presence of left artificial shoulder joint: Secondary | ICD-10-CM | POA: Diagnosis present

## 2022-11-15 DIAGNOSIS — Z886 Allergy status to analgesic agent status: Secondary | ICD-10-CM

## 2022-11-15 DIAGNOSIS — R296 Repeated falls: Secondary | ICD-10-CM | POA: Diagnosis present

## 2022-11-15 DIAGNOSIS — K7469 Other cirrhosis of liver: Secondary | ICD-10-CM

## 2022-11-15 DIAGNOSIS — E669 Obesity, unspecified: Secondary | ICD-10-CM | POA: Diagnosis present

## 2022-11-15 DIAGNOSIS — D649 Anemia, unspecified: Secondary | ICD-10-CM

## 2022-11-15 DIAGNOSIS — I10 Essential (primary) hypertension: Secondary | ICD-10-CM | POA: Diagnosis present

## 2022-11-15 DIAGNOSIS — B9689 Other specified bacterial agents as the cause of diseases classified elsewhere: Secondary | ICD-10-CM | POA: Diagnosis present

## 2022-11-15 DIAGNOSIS — Z882 Allergy status to sulfonamides status: Secondary | ICD-10-CM

## 2022-11-15 DIAGNOSIS — E871 Hypo-osmolality and hyponatremia: Secondary | ICD-10-CM | POA: Diagnosis present

## 2022-11-15 DIAGNOSIS — Z9049 Acquired absence of other specified parts of digestive tract: Secondary | ICD-10-CM

## 2022-11-15 DIAGNOSIS — J90 Pleural effusion, not elsewhere classified: Secondary | ICD-10-CM | POA: Insufficient documentation

## 2022-11-15 DIAGNOSIS — E782 Mixed hyperlipidemia: Secondary | ICD-10-CM | POA: Diagnosis present

## 2022-11-15 DIAGNOSIS — Z86718 Personal history of other venous thrombosis and embolism: Secondary | ICD-10-CM

## 2022-11-15 DIAGNOSIS — Z833 Family history of diabetes mellitus: Secondary | ICD-10-CM

## 2022-11-15 DIAGNOSIS — Z6837 Body mass index (BMI) 37.0-37.9, adult: Secondary | ICD-10-CM

## 2022-11-15 DIAGNOSIS — I959 Hypotension, unspecified: Secondary | ICD-10-CM | POA: Diagnosis present

## 2022-11-15 DIAGNOSIS — E876 Hypokalemia: Secondary | ICD-10-CM | POA: Diagnosis not present

## 2022-11-15 DIAGNOSIS — Z1152 Encounter for screening for COVID-19: Secondary | ICD-10-CM

## 2022-11-15 DIAGNOSIS — Y738 Miscellaneous gastroenterology and urology devices associated with adverse incidents, not elsewhere classified: Secondary | ICD-10-CM | POA: Diagnosis present

## 2022-11-15 DIAGNOSIS — Z885 Allergy status to narcotic agent status: Secondary | ICD-10-CM

## 2022-11-15 DIAGNOSIS — Z515 Encounter for palliative care: Secondary | ICD-10-CM

## 2022-11-15 DIAGNOSIS — Z86711 Personal history of pulmonary embolism: Secondary | ICD-10-CM

## 2022-11-15 DIAGNOSIS — R7881 Bacteremia: Principal | ICD-10-CM | POA: Diagnosis present

## 2022-11-15 DIAGNOSIS — Z96611 Presence of right artificial shoulder joint: Secondary | ICD-10-CM | POA: Diagnosis present

## 2022-11-15 DIAGNOSIS — E872 Acidosis, unspecified: Secondary | ICD-10-CM | POA: Diagnosis present

## 2022-11-15 DIAGNOSIS — E8809 Other disorders of plasma-protein metabolism, not elsewhere classified: Secondary | ICD-10-CM | POA: Diagnosis present

## 2022-11-15 DIAGNOSIS — K5080 Crohn's disease of both small and large intestine without complications: Secondary | ICD-10-CM | POA: Diagnosis present

## 2022-11-15 DIAGNOSIS — F319 Bipolar disorder, unspecified: Secondary | ICD-10-CM | POA: Diagnosis present

## 2022-11-15 DIAGNOSIS — Z66 Do not resuscitate: Secondary | ICD-10-CM | POA: Diagnosis not present

## 2022-11-15 DIAGNOSIS — N390 Urinary tract infection, site not specified: Secondary | ICD-10-CM | POA: Diagnosis present

## 2022-11-15 DIAGNOSIS — Z96641 Presence of right artificial hip joint: Secondary | ICD-10-CM | POA: Diagnosis present

## 2022-11-15 DIAGNOSIS — R627 Adult failure to thrive: Secondary | ICD-10-CM | POA: Insufficient documentation

## 2022-11-15 DIAGNOSIS — Z825 Family history of asthma and other chronic lower respiratory diseases: Secondary | ICD-10-CM

## 2022-11-15 DIAGNOSIS — F17290 Nicotine dependence, other tobacco product, uncomplicated: Secondary | ICD-10-CM | POA: Diagnosis present

## 2022-11-15 DIAGNOSIS — K754 Autoimmune hepatitis: Secondary | ICD-10-CM | POA: Diagnosis present

## 2022-11-15 DIAGNOSIS — Z932 Ileostomy status: Secondary | ICD-10-CM

## 2022-11-15 DIAGNOSIS — R339 Retention of urine, unspecified: Secondary | ICD-10-CM | POA: Diagnosis present

## 2022-11-15 DIAGNOSIS — D61818 Other pancytopenia: Secondary | ICD-10-CM | POA: Diagnosis present

## 2022-11-15 DIAGNOSIS — E11649 Type 2 diabetes mellitus with hypoglycemia without coma: Secondary | ICD-10-CM | POA: Diagnosis not present

## 2022-11-15 DIAGNOSIS — D509 Iron deficiency anemia, unspecified: Secondary | ICD-10-CM | POA: Diagnosis present

## 2022-11-15 DIAGNOSIS — E119 Type 2 diabetes mellitus without complications: Secondary | ICD-10-CM

## 2022-11-15 DIAGNOSIS — Z7901 Long term (current) use of anticoagulants: Secondary | ICD-10-CM

## 2022-11-15 LAB — URINALYSIS, W/ REFLEX TO CULTURE (INFECTION SUSPECTED)
Bacteria, UA: NONE SEEN
Bilirubin Urine: NEGATIVE
Glucose, UA: NEGATIVE mg/dL
Hgb urine dipstick: NEGATIVE
Ketones, ur: NEGATIVE mg/dL
Leukocytes,Ua: NEGATIVE
Nitrite: NEGATIVE
Protein, ur: NEGATIVE mg/dL
Specific Gravity, Urine: 1.012 (ref 1.005–1.030)
pH: 6 (ref 5.0–8.0)

## 2022-11-15 LAB — I-STAT CHEM 8, ED
BUN: 10 mg/dL (ref 6–20)
Calcium, Ion: 1.17 mmol/L (ref 1.15–1.40)
Chloride: 97 mmol/L — ABNORMAL LOW (ref 98–111)
Creatinine, Ser: 1.2 mg/dL (ref 0.61–1.24)
Glucose, Bld: 91 mg/dL (ref 70–99)
HCT: 27 % — ABNORMAL LOW (ref 39.0–52.0)
Hemoglobin: 9.2 g/dL — ABNORMAL LOW (ref 13.0–17.0)
Potassium: 2.8 mmol/L — ABNORMAL LOW (ref 3.5–5.1)
Sodium: 136 mmol/L (ref 135–145)
TCO2: 26 mmol/L (ref 22–32)

## 2022-11-15 LAB — RESP PANEL BY RT-PCR (RSV, FLU A&B, COVID)  RVPGX2
Influenza A by PCR: NEGATIVE
Influenza B by PCR: NEGATIVE
Resp Syncytial Virus by PCR: NEGATIVE
SARS Coronavirus 2 by RT PCR: NEGATIVE

## 2022-11-15 LAB — COMPREHENSIVE METABOLIC PANEL
ALT: 19 U/L (ref 0–44)
AST: 44 U/L — ABNORMAL HIGH (ref 15–41)
Albumin: 1.9 g/dL — ABNORMAL LOW (ref 3.5–5.0)
Alkaline Phosphatase: 98 U/L (ref 38–126)
Anion gap: 8 (ref 5–15)
BUN: 11 mg/dL (ref 6–20)
CO2: 25 mmol/L (ref 22–32)
Calcium: 8.3 mg/dL — ABNORMAL LOW (ref 8.9–10.3)
Chloride: 99 mmol/L (ref 98–111)
Creatinine, Ser: 1.15 mg/dL (ref 0.61–1.24)
GFR, Estimated: >60 mL/min (ref >60)
Glucose, Bld: 95 mg/dL (ref 70–99)
Potassium: 2.6 mmol/L — CL (ref 3.5–5.1)
Sodium: 132 mmol/L — ABNORMAL LOW (ref 135–145)
Total Bilirubin: 1.1 mg/dL (ref 0.3–1.2)
Total Protein: 5.5 g/dL — ABNORMAL LOW (ref 6.5–8.1)

## 2022-11-15 LAB — MAGNESIUM: Magnesium: 1.8 mg/dL (ref 1.7–2.4)

## 2022-11-15 LAB — CBC WITH DIFFERENTIAL/PLATELET
Abs Immature Granulocytes: 0.01 10*3/uL (ref 0.00–0.07)
Basophils Absolute: 0 10*3/uL (ref 0.0–0.1)
Basophils Relative: 0 %
Eosinophils Absolute: 0 10*3/uL (ref 0.0–0.5)
Eosinophils Relative: 0 %
HCT: 27 % — ABNORMAL LOW (ref 39.0–52.0)
Hemoglobin: 8.7 g/dL — ABNORMAL LOW (ref 13.0–17.0)
Immature Granulocytes: 0 %
Lymphocytes Relative: 29 %
Lymphs Abs: 0.7 10*3/uL (ref 0.7–4.0)
MCH: 33.1 pg (ref 26.0–34.0)
MCHC: 32.2 g/dL (ref 30.0–36.0)
MCV: 102.7 fL — ABNORMAL HIGH (ref 80.0–100.0)
Monocytes Absolute: 0.5 10*3/uL (ref 0.1–1.0)
Monocytes Relative: 20 %
Neutro Abs: 1.2 10*3/uL — ABNORMAL LOW (ref 1.7–7.7)
Neutrophils Relative %: 51 %
Platelets: 46 10*3/uL — ABNORMAL LOW (ref 150–400)
RBC: 2.63 MIL/uL — ABNORMAL LOW (ref 4.22–5.81)
RDW: 17.5 % — ABNORMAL HIGH (ref 11.5–15.5)
WBC: 2.3 10*3/uL — ABNORMAL LOW (ref 4.0–10.5)
nRBC: 0 % (ref 0.0–0.2)

## 2022-11-15 LAB — GLUCOSE, CAPILLARY: Glucose-Capillary: 78 mg/dL (ref 70–99)

## 2022-11-15 LAB — SAMPLE TO BLOOD BANK

## 2022-11-15 LAB — LACTIC ACID, PLASMA
Lactic Acid, Venous: 3.8 mmol/L (ref 0.5–1.9)
Lactic Acid, Venous: 3.7 mmol/L (ref 0.5–1.9)

## 2022-11-15 LAB — AMMONIA: Ammonia: 30 umol/L (ref 9–35)

## 2022-11-15 MED ORDER — POTASSIUM CHLORIDE CRYS ER 20 MEQ PO TBCR
40.0000 meq | EXTENDED_RELEASE_TABLET | ORAL | Status: AC
  Administered 2022-11-15 – 2022-11-16 (×2): 40 meq via ORAL
  Filled 2022-11-15 (×2): qty 2

## 2022-11-15 MED ORDER — POTASSIUM CHLORIDE 10 MEQ/100ML IV SOLN
10.0000 meq | INTRAVENOUS | Status: AC
  Administered 2022-11-15 (×3): 10 meq via INTRAVENOUS
  Filled 2022-11-15 (×2): qty 100

## 2022-11-15 MED ORDER — SODIUM CHLORIDE 0.9 % IV SOLN: 2.0000 g | Freq: Three times a day (TID) | INTRAVENOUS | Status: DC

## 2022-11-15 MED ORDER — ACETAMINOPHEN 325 MG PO TABS
650.0000 mg | ORAL_TABLET | Freq: Four times a day (QID) | ORAL | Status: DC | PRN
  Administered 2022-11-21: 650 mg via ORAL
  Filled 2022-11-15: qty 2

## 2022-11-15 MED ORDER — ACETAMINOPHEN 650 MG RE SUPP: 650.0000 mg | Freq: Four times a day (QID) | RECTAL | Status: DC | PRN

## 2022-11-15 MED ORDER — APIXABAN 5 MG PO TABS
5.0000 mg | ORAL_TABLET | Freq: Two times a day (BID) | ORAL | Status: DC
  Administered 2022-11-15 – 2022-11-16 (×3): 5 mg via ORAL
  Filled 2022-11-15 (×3): qty 1

## 2022-11-15 MED ORDER — DIVALPROEX SODIUM 250 MG PO DR TAB
250.0000 mg | DELAYED_RELEASE_TABLET | Freq: Every day | ORAL | Status: DC
  Administered 2022-11-16 – 2022-11-29 (×13): 250 mg via ORAL
  Filled 2022-11-15 (×14): qty 1

## 2022-11-15 MED ORDER — IOHEXOL 300 MG/ML  SOLN
100.0000 mL | Freq: Once | INTRAMUSCULAR | Status: AC | PRN
  Administered 2022-11-15: 100 mL via INTRAVENOUS

## 2022-11-15 MED ORDER — LACTATED RINGERS IV SOLN: INTRAVENOUS | Status: DC

## 2022-11-15 MED ORDER — INSULIN ASPART 100 UNIT/ML IJ SOLN: 0.0000 [IU] | Freq: Every day | INTRAMUSCULAR | Status: DC

## 2022-11-15 MED ORDER — SODIUM CHLORIDE 0.9 % IV SOLN
INTRAVENOUS | Status: DC
  Filled 2022-11-15: qty 1000

## 2022-11-15 MED ORDER — ONDANSETRON HCL 4 MG PO TABS: 4.0000 mg | ORAL_TABLET | Freq: Four times a day (QID) | ORAL | Status: DC | PRN

## 2022-11-15 MED ORDER — PANTOPRAZOLE SODIUM 40 MG PO TBEC
40.0000 mg | DELAYED_RELEASE_TABLET | Freq: Two times a day (BID) | ORAL | Status: DC
  Administered 2022-11-15 – 2022-11-29 (×27): 40 mg via ORAL
  Filled 2022-11-15 (×28): qty 1

## 2022-11-15 MED ORDER — ONDANSETRON HCL 4 MG/2ML IJ SOLN: 4.0000 mg | Freq: Four times a day (QID) | INTRAMUSCULAR | Status: DC | PRN

## 2022-11-15 MED ORDER — RISPERIDONE 1 MG PO TABS
6.0000 mg | ORAL_TABLET | Freq: Every day | ORAL | Status: DC
  Administered 2022-11-15 – 2022-11-27 (×12): 6 mg via ORAL
  Filled 2022-11-15 (×13): qty 6

## 2022-11-15 MED ORDER — HYDROXYCHLOROQUINE SULFATE 200 MG PO TABS
200.0000 mg | ORAL_TABLET | Freq: Two times a day (BID) | ORAL | Status: DC
  Administered 2022-11-15 – 2022-11-29 (×27): 200 mg via ORAL
  Filled 2022-11-15 (×33): qty 1

## 2022-11-15 MED ORDER — POTASSIUM CHLORIDE IN NACL 40-0.9 MEQ/L-% IV SOLN: INTRAVENOUS | Status: AC

## 2022-11-15 MED ORDER — SERTRALINE HCL 50 MG PO TABS
50.0000 mg | ORAL_TABLET | Freq: Every day | ORAL | Status: DC
  Administered 2022-11-15 – 2022-11-29 (×14): 50 mg via ORAL
  Filled 2022-11-15 (×16): qty 1

## 2022-11-15 MED ORDER — TRAZODONE HCL 50 MG PO TABS
100.0000 mg | ORAL_TABLET | Freq: Every day | ORAL | Status: DC
  Administered 2022-11-15 – 2022-11-25 (×11): 100 mg via ORAL
  Filled 2022-11-15 (×11): qty 2

## 2022-11-15 MED ORDER — SODIUM CHLORIDE 0.9 % IV BOLUS (SEPSIS)
1000.0000 mL | Freq: Once | INTRAVENOUS | Status: AC
  Administered 2022-11-15: 1000 mL via INTRAVENOUS

## 2022-11-15 MED ORDER — RISPERIDONE 1 MG PO TABS
1.0000 mg | ORAL_TABLET | Freq: Every day | ORAL | Status: DC
  Administered 2022-11-15 – 2022-11-27 (×12): 1 mg via ORAL
  Filled 2022-11-15 (×12): qty 1

## 2022-11-15 MED ORDER — SODIUM CHLORIDE 0.9 % IV BOLUS (SEPSIS)
500.0000 mL | Freq: Once | INTRAVENOUS | Status: AC
  Administered 2022-11-15: 500 mL via INTRAVENOUS

## 2022-11-15 MED ORDER — METOPROLOL SUCCINATE ER 25 MG PO TB24
25.0000 mg | ORAL_TABLET | Freq: Every day | ORAL | Status: DC
  Administered 2022-11-19 – 2022-11-23 (×4): 25 mg via ORAL
  Filled 2022-11-15 (×9): qty 1

## 2022-11-15 MED ORDER — ALBUTEROL SULFATE (2.5 MG/3ML) 0.083% IN NEBU: 2.5000 mg | INHALATION_SOLUTION | Freq: Four times a day (QID) | RESPIRATORY_TRACT | Status: DC | PRN

## 2022-11-15 MED ORDER — DIVALPROEX SODIUM 250 MG PO DR TAB
1000.0000 mg | DELAYED_RELEASE_TABLET | Freq: Every day | ORAL | Status: DC
  Administered 2022-11-15 – 2022-11-28 (×14): 1000 mg via ORAL
  Filled 2022-11-15 (×14): qty 4

## 2022-11-15 MED ORDER — POTASSIUM CHLORIDE CRYS ER 20 MEQ PO TBCR
60.0000 meq | EXTENDED_RELEASE_TABLET | Freq: Once | ORAL | Status: AC
  Administered 2022-11-15: 60 meq via ORAL
  Filled 2022-11-15: qty 3

## 2022-11-15 MED ORDER — ATORVASTATIN CALCIUM 40 MG PO TABS
80.0000 mg | ORAL_TABLET | Freq: Every day | ORAL | Status: DC
  Administered 2022-11-15 – 2022-11-28 (×13): 80 mg via ORAL
  Filled 2022-11-15 (×15): qty 2

## 2022-11-15 MED ORDER — SODIUM CHLORIDE 0.9 % IV SOLN
2.0000 g | Freq: Once | INTRAVENOUS | Status: AC
  Administered 2022-11-15: 2 g via INTRAVENOUS
  Filled 2022-11-15: qty 12.5

## 2022-11-15 MED ORDER — INSULIN ASPART 100 UNIT/ML IJ SOLN
0.0000 [IU] | Freq: Three times a day (TID) | INTRAMUSCULAR | Status: DC
  Administered 2022-11-18 – 2022-11-22 (×2): 2 [IU] via SUBCUTANEOUS

## 2022-11-15 MED ORDER — BENZTROPINE MESYLATE 1 MG PO TABS
0.5000 mg | ORAL_TABLET | Freq: Every day | ORAL | Status: DC
  Administered 2022-11-16 – 2022-11-29 (×13): 0.5 mg via ORAL
  Filled 2022-11-15 (×14): qty 1

## 2022-11-15 NOTE — ED Notes (Signed)
Multiple sticks attempted for IV accessby two different RNs, was able to obtain IV access, unsuccessful in obtaining both sets of blood cultures, order for "do not delay IV antibiotics"

## 2022-11-15 NOTE — ED Notes (Signed)
Multiple bruises located on back upon inspection during triage, MD at bedside noted them as well.

## 2022-11-15 NOTE — Assessment & Plan Note (Signed)
Pt confused. Not oriented to time. Check ammonia.

## 2022-11-15 NOTE — ED Triage Notes (Signed)
Pt arrived via [facility] from home with c/o possible UTI in which he was prescribed antibiotics for previously diagnosed UTI that he has not been taking his antibiotics for. Also bilateral swelling in legs that pt states makes it hard to stand, confusion, 3 falls in the past 24 hours, pt is on blood thinners, denies hitting head

## 2022-11-15 NOTE — ED Provider Notes (Signed)
[facility] Provider Note   CSN: [id number] Arrival date & time: 11/15/22  1203     History {Add pertinent medical, surgical, social history, OB history to HPI:1} Chief Complaint  Patient presents with   Urinary Tract Infection    KINZER HORNEY is a 57 y.o. male.  57 year old male with a history of iron deficiency anemia, DVT and PE on Eliquis, rheumatoid arthritis, Crohn's disease status post ileostomy, and recent GI bleed who presents to the emergency department with weakness and falls.  Patient reports that recently he has had bilateral lower extremity weakness and some falls.  Reports that he has had black ostomy output.  Was recently hospitalized for this and underwent APC on 09/07/2022 on EGD that showed a source of an upper GI bleed.  Says that he is also being treated with Keflex for a urinary tract infection.  Papers also show that he may be on Bactrim.  Says that he had a fall today and so he wanted to come in for evaluation.  Says that he has pain going up and down his spine after falling and did have a head strike.       Home Medications Prior to Admission medications   Medication Sig Start Date End Date Taking? Authorizing Provider  albuterol (PROVENTIL) (2.5 MG/3ML) 0.083% nebulizer solution Take 2.5 mg by nebulization every 6 (six) hours as needed for wheezing or shortness of breath.    [provider]  albuterol (VENTOLIN HFA) 108 (90 Base) MCG/ACT inhaler Inhale 2 puffs into the lungs every 6 (six) hours as needed for wheezing or shortness of breath.    [provider]  atorvastatin (LIPITOR) 80 MG tablet Take 80 mg by mouth daily.    [provider]  benztropine (COGENTIN) 0.5 MG tablet Take 0.5 mg by mouth daily.     [provider]  CALCIUM 600/VITAMIN D3 600-800 MG-UNIT TABS TAKE (1) TABLET BY MOUTH THREE TIMES DAILY AFTER MEALS. 06/05/21   Mahala Menghini, PA-C  cyanocobalamin 1000 MCG  tablet Take 1,000 mcg by mouth daily.    [provider]  divalproex (DEPAKOTE) 250 MG DR tablet Take 250 mg by mouth daily. 11/21/20   [provider]  divalproex (DEPAKOTE) 500 MG DR tablet Take 1,000 mg by mouth at bedtime. 02/03/22   [provider]  ELIQUIS 5 MG TABS tablet Take 1 tablet (5 mg total) by mouth 2 (two) times daily. 09/09/22   Johnson, Clanford L, MD  ferrous sulfate 325 (65 FE) MG EC tablet Take 1 tablet (325 mg total) by mouth daily with breakfast. 08/23/22   Tarri Abernethy M, PA-C  folic acid (FOLVITE) 1 MG tablet Take 1 tablet by mouth daily.    [provider]  gabapentin (NEURONTIN) 300 MG capsule Take 900 mg by mouth 3 (three) times daily.    [provider]  hydroxychloroquine (PLAQUENIL) 200 MG tablet Take 1 tablet by mouth 2 (two) times daily. 07/06/21   [provider]  metoprolol succinate (TOPROL-XL) 25 MG 24 hr tablet Take 1 tablet (25 mg total) by mouth daily. Take with or immediately following a meal. 09/10/22   Murlean Iba, MD  Misc. Devices (WALKER WHEELS) MISC USE AS DIRECTED 06/06/19   Fields, Marga Melnick, MD  nitroGLYCERIN (NITROSTAT) 0.4 MG SL tablet Place 1 tablet (0.4 mg total) under the tongue every 5 (five) minutes as needed for chest pain. 01/26/21 07/11/21  Elouise Munroe, MD  pantoprazole (PROTONIX) 40 MG tablet TAKE (1) TABLET BY MOUTH TWICE A DAY BEFORE MEALS. (BREAKFAST AND SUPPER) 08/01/20   Mahala Menghini, PA-C  risperiDONE (RISPERDAL) 1 MG tablet Take 1 mg by mouth at bedtime.    [provider]  risperiDONE (RISPERDAL) 3 MG tablet Take 6 mg by mouth at bedtime. 05/12/21   [provider]  sertraline (ZOLOFT) 50 MG tablet Take 50 mg by mouth daily.    [provider]  tamsulosin (FLOMAX) 0.4 MG CAPS capsule Take 0.4 mg by mouth daily. 07/03/20   [provider]  torsemide (DEMADEX) 20 MG tablet Take 1 tablet (20 mg total) by mouth daily. 11/10/22   Elouise Munroe, MD  traZODone (DESYREL) 100 MG tablet Take 100 mg by mouth at bedtime. 05/12/21   [provider]      Allergies    Penicillins, Bactrim [sulfamethoxazole-trimethoprim], Oxycodone-acetaminophen, Remicade [infliximab], Aspirin, Codeine, Ibuprofen, Macrobid [nitrofurantoin], Tramadol, Tramadol hcl, and Vicodin [hydrocodone-acetaminophen]    Review of Systems   Review of Systems  Physical Exam Updated Vital Signs BP (!) 83/73   Pulse 89   Resp 18   Ht 5' 6"$  (1.676 m)   Wt 100.2 kg   SpO2 99%   BMI 35.67 kg/m  Physical Exam Vitals and nursing note reviewed.  Constitutional:      General: He is not in acute distress.    Appearance: He is well-developed.  HENT:     Head: Normocephalic and atraumatic.     Right Ear: External ear normal.     Left Ear: External ear normal.     Nose: Nose normal.  Eyes:     Extraocular Movements: Extraocular movements intact.     Conjunctiva/sclera: Conjunctivae normal.     Pupils: Pupils are equal, round, and reactive to light.  Cardiovascular:     Rate and Rhythm: Normal rate and regular rhythm.     Heart sounds: Normal heart sounds.  Pulmonary:     Effort: Pulmonary effort is normal. No respiratory distress.     Breath sounds: Normal breath sounds.  Abdominal:     General: There is no distension.     Palpations: Abdomen is soft. There is no mass.     Tenderness: There is abdominal tenderness (Diffuse). There is no guarding.     Comments: Bilious output from ostomy  Musculoskeletal:     Cervical back: Normal range of motion and neck supple.     Right lower leg: No edema.     Left lower leg: No edema.  Skin:    General: Skin is warm and dry.  Neurological:     Mental Status: He is alert. Mental status is at baseline.  Psychiatric:        Mood and Affect: Mood normal.        Behavior: Behavior normal.     ED Results / Procedures / Treatments   Labs (all labs ordered are listed, but only abnormal results are  displayed) Labs Reviewed  RESP PANEL BY RT-PCR (RSV, FLU A&B, COVID)  RVPGX2  CULTURE, BLOOD (ROUTINE X 2)  CULTURE, BLOOD (ROUTINE X 2)  LACTIC ACID, PLASMA  LACTIC ACID, PLASMA  COMPREHENSIVE METABOLIC PANEL  CBC WITH DIFFERENTIAL/PLATELET  URINALYSIS, W/ REFLEX TO CULTURE (INFECTION SUSPECTED)    EKG None  Radiology No results found.  Procedures Procedures  {Document cardiac monitor, telemetry assessment procedure when appropriate:1}  Medications Ordered in ED Medications  lactated ringers infusion (has no administration in time range)  sodium chloride 0.9 % bolus 1,000 mL (has no administration in time range)    And  sodium chloride 0.9 % bolus 1,000 mL (has no administration in time range)    And  sodium chloride 0.9 % bolus 1,000 mL (has no administration in time range)    And  sodium chloride 0.9 % bolus 500 mL (has no administration in time range)  ceFEPIme (MAXIPIME) 2 g in sodium chloride 0.9 % 100 mL IVPB (has no administration in time range)  ceFEPIme (MAXIPIME) 2 g in sodium chloride 0.9 % 100 mL IVPB (has no administration in time range)    ED Course/ Medical Decision Making/ A&P   {   Click here for ABCD2, HEART and other calculatorsREFRESH Note before signing :1}                          Medical Decision Making Amount and/or Complexity of Data Reviewed Labs: ordered. Radiology: ordered. ECG/medicine tests: ordered.  Risk Prescription drug management.   ***  {Document critical care time when appropriate:1} {Document review of labs and clinical decision tools ie heart score, Chads2Vasc2 etc:1}  {Document your independent review of radiology images, and any outside records:1} {Document your discussion with family members, caretakers, and with consultants:1} {Document social determinants of health affecting pt's care:1} {Document your decision making why or why not admission, treatments were needed:1} Final Clinical Impression(s) / ED  Diagnoses Final diagnoses:  None    Rx / DC Orders ED Discharge Orders     None

## 2022-11-15 NOTE — Assessment & Plan Note (Signed)
Continue with Eliquis 5 mg bid.

## 2022-11-15 NOTE — Assessment & Plan Note (Signed)
Chronic. Likely the cause of his LE edema.

## 2022-11-15 NOTE — Assessment & Plan Note (Signed)
Admit to Observation telemetry bed. Continue with IV and PO replacement. Likely cause by his demadex treatment. Pt on diuretics due to his LE edema. Rx'd by his cardiologist.  Pt without hx of CHF. I think however his LE edema is likely due to his low serum albumin and not from volume overload.  He only has small volume ascites. Aldactone may be a better choice of diuretics for him.

## 2022-11-15 NOTE — Assessment & Plan Note (Signed)
Likely due to volume depletion. Continue with Ivf.

## 2022-11-15 NOTE — Subjective & Objective (Signed)
CC: falls at home, weakness HPI: 57 year old male history of type 2 diabetes, Crohn's disease status post ileostomy, history of recurrent DVT/PE on Eliquis, bipolar disorder who presents to the ER today with multiple falls in the last 24 hours.  Patient is a very poor historian.  He is confused.  He is not oriented to time.  Patient giving confusing answers to his questions.  Instead of stating he does not know the answer the question, patient is making up answers.  Patient was seen on 05 November 2022 by liver transplant at [facility].  He was noted to have nonalcoholic fatty liver disease along with cirrhosis.  Autoimmune workup was started.  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  Has not had follow-up yet.  Patient been taking Demadex 20 mg a day prescribed by his cardiologist due to lower extremity edema.  Patient has a very low serum albumin of less than 2.  Patient has not been started on Aldactone yet.  This was mentioned during his hepatology consult.  Patient states that he is taking Aldactone but there is no recording of this in his chart.  Again this is one of the answers that he made up during his interview.  On arrival temp 90.2 heart rate 89 blood pressure 83 over 7379% on room air.  White count 2.3, he one 8.7, platelets of 46  Sodium 132, potassium 2.6, BUN 11, creatinine 1.1, total protein 5.5, albumin 1.9 AST of 44 ALT of 19 alk phos of 98  Lactic acid elevated 3.8  COVID-negative, influenza AMB negative, RSV negative.  UA negative.  Reportedly patient was being treated as an outpatient for a UTI.  He is completed 6 doses of 20 doses of Keflex 5 mg twice daily.  Due to the patient's persistent hypokalemia, [facility] hospitalist contacted for admission.

## 2022-11-15 NOTE — Assessment & Plan Note (Signed)
Seen on CT chest. Possibly related to low serum albumin. 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 Although he only has a small amount of ascites. Pt not requiring any supplemental O2 and pleural effusion does not seem to be bothering him. May just need to monitor it for now.

## 2022-11-15 NOTE — H&P (Signed)
History and Physical    [name] [id number] DOB: 05-23-1966 DOA: 11/15/2022  DOS: the patient was seen and examined on 11/15/2022  PCP: [facility]   Patient coming from: Home  I have personally briefly reviewed patient's old medical records in [place]  CC: falls at home, weakness HPI: 57 year old male history of type 2 diabetes, Crohn's disease status post ileostomy, history of recurrent DVT/PE on Eliquis, bipolar disorder who presents to the ER today with multiple falls in the last 24 hours.  Patient is a very poor historian.  He is confused.  He is not oriented to time.  Patient giving confusing answers to his questions.  Instead of stating he does not know the answer the question, patient is making up answers.  Patient was seen on 05 November 2022 by liver transplant at [facility].  He was noted to have nonalcoholic fatty liver disease along with cirrhosis.  Autoimmune workup was started.  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  Has not had follow-up yet.  Patient been taking Demadex 20 mg a day prescribed by his cardiologist due to lower extremity edema.  Patient has a very low serum albumin of less than 2.  Patient has not been started on Aldactone yet.  This was mentioned during his hepatology consult.  Patient states that he is taking Aldactone but there is no recording of this in his chart.  Again this is one of the answers that he made up during his interview.  On arrival temp 90.2 heart rate 89 blood pressure 83 over 7379% on room air.  White count 2.3, he one 8.7, platelets of 46  Sodium 132, potassium 2.6, BUN 11, creatinine 1.1, total protein 5.5, albumin 1.9 AST of 44 ALT of 19 alk phos of 98  Lactic acid elevated 3.8  COVID-negative, influenza AMB negative, RSV negative.  UA negative.  Reportedly patient was being treated as an outpatient for a UTI.  He is completed 6 doses of 20 doses of Keflex 5 mg twice  daily.  Due to the patient's persistent hypokalemia, [facility] hospital[facility] contacted for admission.    ED Course: K 2.6.  Review of Systems:  Review of Systems  Constitutional:  Positive for malaise/fatigue.  HENT: Negative.    Eyes: Negative.   Respiratory: Negative.    Cardiovascular: Negative.   Gastrointestinal: Negative.   Genitourinary: Negative.   Musculoskeletal:  Positive for falls.  Skin: Negative.   Neurological:  Positive for weakness.  Endo/Heme/Allergies: Negative.   Psychiatric/Behavioral:  Positive for memory loss.   All other systems reviewed and are negative.   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[place])    right, s/p replacement, due to prednisone   Bipolar disorder ([place])    Colostomy in place [facility])    Crohn's colitis ([place])    s/p total colectomy with ileostomy in 2009   Crohn's disease of small intestine ([place]) 06/2011   ileoscopy: multiple ulcers likely secondary to [place], HX OF 02/11/2009   Qualifier: Diagnosis of  By: Zeb Comfort     Depression    DVT (deep venous thrombosis) ([place]) 2009   right upper extremity due to PICC   Enterococcus UTI 2009   Falls, initial encounter 06/06/2019   GERD (gastroesophageal reflux disease)    GI bleed  09/06/2022   Hernia    Hyperlipidemia    IDA (iron deficiency anemia) 06/17/2022   Insomnia    Migraine headache    Nystagmus    Partial small bowel obstruction ([place]) 02/10/2018   PULMONARY EMBOLISM, HX OF 02/11/2009   Qualifier: Diagnosis of  By: Zeb Comfort     S/P endoscopy 06/2011   mild gastritis, small hiatal hernia, no ulcers   SBO (small bowel obstruction) ([place]) 12/29/2017   Schizophrenia ([place])    Schizophrenia, acute ([place])     Past Surgical History:  Procedure Laterality Date   APPENDECTOMY     CYSTOSCOPY WITH DIRECT VISION INTERNAL URETHROTOMY N/A  03/25/2022   Procedure: CYSTOSCOPY WITH DIRECT VISION INTERNAL URETHROTOMY;  Surgeon: Cleon Gustin, MD;  Location: [facility] OR[facility];  Service: Urology;  Laterality: N/A;   ESOPHAGOGASTRODUODENOSCOPY  07/2010   gastritis,  bx neg for H.Pylori   ESOPHAGOGASTRODUODENOSCOPY N/A 01/08/2014   SLF:NO SOURCE FOR ODYNOPHAGIA IDENTIFIED/Multiple small ulcers in the gastric antrum   ESOPHAGOGASTRODUODENOSCOPY (EGD) WITH PROPOFOL N/A 09/07/2022   Procedure: ESOPHAGOGASTRODUODENOSCOPY (EGD) WITH PROPOFOL;  Surgeon: Harvel Quale, MD;  Location: [facility];  Service: Gastroenterology;  Laterality: N/A;   EYE SURGERY     HOT HEMOSTASIS  09/07/2022   Procedure: HOT HEMOSTASIS (ARGON PLASMA COAGULATION/BICAP);  Surgeon: Montez Morita, Quillian Quince, MD;  Location: [facility] END[facility];  Service: Gastroenterology;;   ILEOSCOPY  06/29/2011   SLF: ulcers,multiple/small HH/mild gastritis   KIDNEY SURGERY     LAPAROTOMY N/A 06/05/2013   Procedure: EXPLORATORY LAPAROTOMY;  Surgeon: Donato Heinz, MD;  Location: [facility];  Service: General;  Laterality: N/A;   LYSIS OF ADHESION N/A 06/05/2013   Procedure: LYSIS OF ADHESIONS;  Surgeon: Donato Heinz, MD;  Location: [facility];  Service: General;  Laterality: N/A;   small bowel capsule study  08/2010   few tiny nonbleeding erosions/ulcers ?secondary to relafen or Crohn's. Entire small bowel not seen.    TOTAL COLECTOMY  2009   with ileostomy at [facility] for refractory disease    TOTAL HIP ARTHROPLASTY     right, due to avascular necrosis from chronic prednisone   TOTAL SHOULDER REPLACEMENT     bilateral   TRANSURETHRAL RESECTION OF PROSTATE N/A 12/03/2019   Procedure: TRANSURETHRAL RESECTION OF THE PROSTATE (TURP);  Surgeon: Cleon Gustin, MD;  Location: [facility];  Service: Urology;  Laterality: N/A;     reports that he has been smoking cigarettes. He has a 37.00 pack-year smoking history. He has never used smokeless tobacco. He reports that he does not drink alcohol and  does not use drugs.  Allergies  Allergen Reactions   Penicillins Anaphylaxis   Bactrim [Sulfamethoxazole-Trimethoprim] Other (See Comments)    ABD CRAMPS AND DIARRHEA Welps/itch   Oxycodone-Acetaminophen Other (See Comments)    Other reaction(s): Swelling   Remicade [Infliximab] Other (See Comments)    COULDN'T BREATHE   Aspirin     REACTION: unknown reaction   Codeine     REACTION: unknown reaction   Ibuprofen     REACTION: unknown reaction   Macrobid [Nitrofurantoin] Itching   Tramadol Nausea Only    Other reaction(s): Nausea   Tramadol Hcl     REACTION: unknown reaction   Vicodin [Hydrocodone-Acetaminophen] Nausea Only    Family History  Problem Relation Age of Onset   Diabetes Mother    Heart disease Mother    Diabetes Father    Heart disease Father    COPD Maternal Grandmother    Colon  cancer Neg Hx     Prior to Admission medications   Medication Sig Start Date End Date Taking? Authorizing Provider    Physical Exam: Vitals:   11/15/22 1345 11/15/22 1410 11/15/22 1653 11/15/22 1700  BP:  126/80 109/71 111/79  Pulse: (!) 105 (!) 105 (!) 106   Resp: 18 12 20   $ Temp:   97.8 F (36.6 C)   TempSrc:   Oral   SpO2: 98% 100% 96%   Weight:      Height:        Physical Exam Vitals and nursing note reviewed.  Constitutional:      General: He is not in acute distress.    Appearance: He is obese. He is not toxic-appearing.     Comments: Chronically ill appearing  HENT:     Head: Normocephalic and atraumatic.     Nose: Nose normal.  Cardiovascular:     Rate and Rhythm: Normal rate and regular rhythm.     Pulses: Normal pulses.  Pulmonary:     Effort: Pulmonary effort is normal.     Breath sounds: Normal breath sounds.  Abdominal:     General: There is no distension.     Tenderness: There is no abdominal tenderness.     Comments: RLQ ileostomy with green-ish liquid in ostomy bag.   Skin:    General: Skin is warm and dry.     Capillary Refill: Capillary  refill takes less than 2 seconds.     Comments: +1 pitting LE edema Chronic venous stasis of both lower extremities.  Neurological:     Mental Status: He is disoriented.      Labs on Admission: I have personally reviewed following labs and imaging studies  CBC: Recent Labs  Lab 11/15/22 1223 11/15/22 1254  WBC 2.3*  --   NEUTROABS 1.2*  --   HGB 8.7* 9.2*  HCT 27.0* 27.0*  MCV 102.7*  --   PLT 46*  --    Basic Metabolic Panel: Recent Labs  Lab 11/15/22 1223 11/15/22 1254  NA 132* 136  K 2.6* 2.8*  CL 99 97*  CO2 25  --   GLUCOSE 95 91  BUN 11 10  CREATININE 1.15 1.20  CALCIUM 8.3*  --   MG 1.8  --    GFR: Estimated Creatinine Clearance: 75.3 mL/min (by C-G formula based on SCr of 1.2 mg/dL). Liver Function Tests: Recent Labs  Lab 11/15/22 1223  AST 44*  ALT 19  ALKPHOS 98  BILITOT 1.1  PROT 5.5*  ALBUMIN 1.9*   No results for input(s): "LIPASE", "AMYLASE" in the last 168 hours. No results for input(s): "AMMONIA" in the last 168 hours. Coagulation Profile: No results for input(s): "INR", "PROTIME" in the last 168 hours. Cardiac Enzymes: No results for input(s): "CKTOTAL", "CKMB", "CKMBINDEX", "TROPONINI", "TROPONINIHS" in the last 168 hours. BNP (last 3 results) No results for input(s): "PROBNP" in the last 8760 hours. HbA1C: No results for input(s): "HGBA1C" in the last 72 hours. CBG: No results for input(s): "GLUCAP" in the last 168 hours. Lipid Profile: No results for input(s): "CHOL", "HDL", "LDLCALC", "TRIG", "CHOLHDL", "LDLDIRECT" in the last 72 hours. Thyroid Function Tests: No results for input(s): "TSH", "T4TOTAL", "FREET4", "T3FREE", "THYROIDAB" in the last 72 hours. Anemia Panel: No results for input(s): "VITAMINB12", "FOLATE", "FERRITIN", "TIBC", "IRON", "RETICCTPCT" in the last 72 hours. Urine analysis:    Component Value Date/Time   COLORURINE YELLOW 11/15/2022 [place] 11/15/2022 1327   APPEARANCEUR  Cloudy (A)  08/25/2022 1417   LABSPEC 1.012 11/15/2022 1327   PHURINE 6.0 11/15/2022 1327   GLUCOSEU NEGATIVE 11/15/2022 1327   HGBUR NEGATIVE 11/15/2022 1327   BILIRUBINUR NEGATIVE 11/15/2022 1327   BILIRUBINUR Negative 08/25/2022 1417   KETONESUR NEGATIVE 11/15/2022 1327   PROTEINUR NEGATIVE 11/15/2022 1327   UROBILINOGEN 0.2 10/17/2014 1940   NITRITE NEGATIVE 11/15/2022 1327   LEUKOCYTESUR NEGATIVE 11/15/2022 1327    Radiological Exams on Admission: I have personally reviewed images CT CHEST ABDOMEN PELVIS W CONTRAST  Result Date: 11/15/2022 CLINICAL DATA:  Sepsis, polytrauma, fall EXAM: CT CHEST, ABDOMEN, AND PELVIS WITH CONTRAST TECHNIQUE: Multidetector CT imaging of the chest, abdomen and pelvis was performed following the standard protocol during bolus administration of intravenous contrast. RADIATION DOSE REDUCTION: This exam was performed according to the departmental dose-optimization program which includes automated exposure control, adjustment of the mA and/or kV according to patient size and/or use of iterative reconstruction technique. CONTRAST:  142m OMNIPAQUE IOHEXOL 300 MG/ML  SOLN COMPARISON:  CT abdomen pelvis, 09/06/2022 CT chest, 04/15/2022 FINDINGS: CT CHEST FINDINGS Cardiovascular: Right chest port catheter. Aortic atherosclerosis. Normal heart size. Left coronary artery calcifications. No pericardial effusion. Mediastinum/Nodes: No enlarged mediastinal, hilar, or axillary lymph nodes. Small incidental diverticulum of the posterior right trachea at the level of the thoracic inlet (series 2, image 8). Thyroid gland and esophagus demonstrate no significant findings. Lungs/Pleura: Moderate right pleural effusion and associated atelectasis or consolidation. Musculoskeletal: No chest wall abnormality. No acute osseous findings. Status post bilateral shoulder arthroplasty. CT ABDOMEN PELVIS FINDINGS Hepatobiliary: Coarse contour of the liver. No gallstones, gallbladder wall thickening, or  biliary dilatation. Pancreas: Unremarkable. No pancreatic ductal dilatation or surrounding inflammatory changes. Spleen: Normal in size without significant abnormality. Adrenals/Urinary Tract: Adrenal glands are unremarkable. Kidneys are normal, without renal calculi, solid lesion, or hydronephrosis. Malpositioned Foley catheter, tip and balloon within the prostatic or bulbous urethra (series 5, image 111) Stomach/Bowel: Stomach is within normal limits. 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 Vascular/Lymphatic: Aortic atherosclerosis. 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 No enlarged abdominal or pelvic lymph nodes. Reproductive: No mass or other abnormality. Other: No abdominal wall hernia or abnormality. Small volume perihepatic ascites. Musculoskeletal: No acute osseous findings. 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 IMPRESSION: 1. No CT evidence of acute traumatic injury to the chest, abdomen, or pelvis. 2. Moderate right pleural effusion and associated atelectasis or consolidation. 3. Malpositioned Foley catheter, tip and balloon within the prostatic or bulbous urethra. Recommend repositioning. 4. 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 5. Small volume perihepatic ascites. 6. Coarse contour of the liver and gastric varices, suggestive of cirrhosis. 7. Coronary artery disease. Aortic Atherosclerosis (ICD10-I70.0). Electronically Signed   By: ADelanna AhmadiM.D.   On: 11/15/2022 14:32   CT Head Wo Contrast  Result Date: 11/15/2022 CLINICAL DATA:  Three falls in the past 24 hours. EXAM: CT HEAD WITHOUT CONTRAST CT CERVICAL SPINE WITHOUT CONTRAST TECHNIQUE: Multidetector CT imaging of the head and cervical spine was performed following the standard protocol without intravenous contrast. Multiplanar CT image reconstructions of the cervical spine were also generated. RADIATION DOSE REDUCTION: This exam was  performed according to the departmental dose-optimization program which includes automated exposure control, adjustment of the mA and/or kV according to patient size and/or use of iterative reconstruction technique. COMPARISON:  CT head 05/16/2021 FINDINGS: CT HEAD FINDINGS Brain: There is no acute intracranial hemorrhage, extra-axial fluid collection, or  acute infarct. Parenchymal volume is normal. The ventricles are normal in size. Gray-white differentiation is preserved. The pituitary and suprasellar region are normal. There is no mass lesion. There is no mass effect or midline shift. Vascular: No hyperdense vessel or unexpected calcification. Skull: Normal. Negative for fracture or focal lesion. Sinuses/Orbits: There is mild mucosal thickening in the paranasal sinuses. The globes and orbits are unremarkable. Other: None. CT CERVICAL SPINE FINDINGS Alignment: Normal. There is no jumped or perched facet or other evidence of traumatic malalignment. Skull base and vertebrae: Skull base alignment is maintained. Vertebral body heights are preserved. There is no evidence of acute fracture. There is no suspicious osseous lesion. Soft tissues and spinal canal: No prevertebral fluid or swelling. No visible canal hematoma. Disc levels: Facet arthropathy is most advanced on the right at C3-C4. There is overall mild disc space narrowing at C4-C5 and C5-C6. There is no convincing high-grade spinal canal stenosis. Upper chest: Assessed on the separately dictated CT chest. Other: None. IMPRESSION: 1. No acute intracranial pathology. 2. No acute fracture or traumatic malalignment of the cervical spine. Electronically Signed   By: Valetta Mole M.D.   On: 11/15/2022 14:24   CT Cervical Spine Wo Contrast  Result Date: 11/15/2022 CLINICAL DATA:  Three falls in the past 24 hours. EXAM: CT HEAD WITHOUT CONTRAST CT CERVICAL SPINE WITHOUT CONTRAST TECHNIQUE: Multidetector CT imaging of the head and cervical spine was performed  following the standard protocol without intravenous contrast. Multiplanar CT image reconstructions of the cervical spine were also generated. RADIATION DOSE REDUCTION: This exam was performed according to the departmental dose-optimization program which includes automated exposure control, adjustment of the mA and/or kV according to patient size and/or use of iterative reconstruction technique. COMPARISON:  CT head 05/16/2021 FINDINGS: CT HEAD FINDINGS Brain: There is no acute intracranial hemorrhage, extra-axial fluid collection, or acute infarct. Parenchymal volume is normal. The ventricles are normal in size. Gray-white differentiation is preserved. The pituitary and suprasellar region are normal. There is no mass lesion. There is no mass effect or midline shift. Vascular: No hyperdense vessel or unexpected calcification. Skull: Normal. Negative for fracture or focal lesion. Sinuses/Orbits: There is mild mucosal thickening in the paranasal sinuses. The globes and orbits are unremarkable. Other: None. CT CERVICAL SPINE FINDINGS Alignment: Normal. There is no jumped or perched facet or other evidence of traumatic malalignment. Skull base and vertebrae: Skull base alignment is maintained. Vertebral body heights are preserved. There is no evidence of acute fracture. There is no suspicious osseous lesion. Soft tissues and spinal canal: No prevertebral fluid or swelling. No visible canal hematoma. Disc levels: Facet arthropathy is most advanced on the right at C3-C4. There is overall mild disc space narrowing at C4-C5 and C5-C6. There is no convincing high-grade spinal canal stenosis. Upper chest: Assessed on the separately dictated CT chest. Other: None. IMPRESSION: 1. No acute intracranial pathology. 2. No acute fracture or traumatic malalignment of the cervical spine. Electronically Signed   By: Valetta Mole M.D.   On: 11/15/2022 14:24   DG Chest Port 1 View  Result Date: 11/15/2022 CLINICAL DATA:  Sepsis,  possible UTI EXAM: PORTABLE CHEST 1 VIEW COMPARISON:  CT chest dated 03/16/2022 FINDINGS: Lungs are clear.  No pleural effusion or pneumothorax. The heart is normal in size. Right chest power port terminates at the cavoatrial junction. Bilateral shoulder arthroplasty. IMPRESSION: No evidence of acute cardiopulmonary disease. Electronically Signed   By: Julian Hy M.D.   On: 11/15/2022 13:35  EKG: My personal interpretation of EKG shows: NSR. I disagree with computer interpretation. Rhythm Is it not atrial flutter.      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 Continue with IV and PO replacement. Likely cause by his demadex treatment. Pt on diuretics due to his LE edema. Rx'd by his cardiologist.  Pt without hx of CHF. I think however his LE edema is likely due to his low serum albumin and not from volume overload.  He only has small volume ascites. Aldactone may be a better choice of diuretics for him.  Liver cirrhosis secondary to nonalcoholic steatohepatitis (NASH) ([place]) Pt was seen by transplant hepatology at [facility] on 11-05-2022. He has outpatient labs that showed + ANA and + anti-smooth muscle antibody. This calls into question whether his cirrhosis is caused by autoimmune hepatitis. Pt's LFTs are only slightly abnormal with AST 44(normal range 15-41). Will defer steroid therapy to [facility] hepatology. Pt with GAVE and portal hypertensive gastropathy seen on EGD on 09-07-2022. Pt already on  Toprol-xl 25 mg daily.  In regards to his diuretic therapy, starting him on aldactone(after his serum potassium has been corrected) and stopping his demadex may be a better choice for him.  EGD findings from 09-07-2022 at [place]:      A 1 cm sliding hiatal hernia was found. No varices in the esophagus.      Patchy mildly erythematous mucosa with bleeding was found in the gastric       fundus - portal hypertensive gastropathy?. Coagulation for hemostasis       using argon plasma at 0.3 liters/minute and 20 watts was successful.      Moderate gastric antral vascular ectasia with scant spontaenous bleeding       was present in the gastric antrum. There was a scant amount of fresh       blood in the antrum. Coagulation for hemostasis using argon plasma at       0.3 liters/minute and 20 watts was successful. No gastric varices.      Localized mildly erythematous mucosa without active bleeding was found       in the first portion of the duodenum and in the second portion of the       duodenum. Impression:               - 1 cm sliding hiatal hernia.                           - Erythematous mucosa in the gastric fundus.                            Treated with argon plasma coagulation ([facility]).                           - Gastric antral vascular ectasia with bleeding.  Treated with argon plasma coagulation ([facility]).                           - Erythematous duodenopathy.                           - No specimens collected.   Acute metabolic encephalopathy Pt confused. Not oriented to time. Check ammonia.  Lactic acidosis Likely due to volume depletion. Continue with Ivf.  History of DVT (deep vein thrombosis) Continue with Eliquis 5 mg bid.  Hypoalbuminemia due to protein-calorie malnutrition (HCC) Chronic. Likely the cause of his LE edema.  DM type 2 (diabetes mellitus, type 2) (HCC) Stable.  Crohn's disease of both small and large intestine with  complication ([place]) Pt with ileostomy. Pt denies any high output from ileostomy.  Pleural effusion on right Seen on CT chest. Possibly related to low serum albumin. 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 Although he only has a small amount of ascites. Pt not requiring any supplemental O2 and pleural effusion does not seem to be bothering him. May just need to monitor it for now.  BIPOLAR AFFECTIVE DISORDER Continue with his psych meds.   DVT prophylaxis: Eliquis Code Status: Full Code Family Communication: no family at bedside  Disposition Plan: return home  Consults called: none  Admission status: Observation, Telemetry bed   Kristopher Oppenheim, DO [facility] 11/15/2022, 6:03 PM

## 2022-11-15 NOTE — ED Notes (Signed)
MD made aware of BP of 83/73, MD at bedside

## 2022-11-15 NOTE — Assessment & Plan Note (Signed)
Pt with ileostomy. Pt denies any high output from ileostomy.

## 2022-11-15 NOTE — Assessment & Plan Note (Addendum)
Pt was seen by transplant hepatology at [facility] on 11-05-2022. He has outpatient labs that showed + ANA and + anti-smooth muscle antibody. This calls into question whether his cirrhosis is caused by autoimmune hepatitis. Pt's LFTs are only slightly abnormal with AST 44(normal range 15-41). Will defer steroid therapy to [facility] hepatology. Pt with GAVE and portal hypertensive gastropathy seen on EGD on 09-07-2022. Pt already on Toprol-xl 25 mg daily.  In regards to his diuretic therapy, starting him on aldactone(after his serum potassium has been corrected) and stopping his demadex may be a better choice for him.  EGD findings from 09-07-2022 at [place]:      A 1 cm sliding hiatal hernia was found. No varices in the esophagus.      Patchy mildly erythematous mucosa with bleeding was found in the gastric       fundus - portal hypertensive gastropathy?. Coagulation for hemostasis       using argon plasma at 0.3 liters/minute and 20 watts was successful.      Moderate gastric antral vascular ectasia with scant spontaenous bleeding       was present in the gastric antrum. There was a scant amount of fresh       blood in the antrum. Coagulation for hemostasis using argon plasma at       0.3 liters/minute and 20 watts was successful. No gastric varices.      Localized mildly erythematous mucosa without active bleeding was found       in the first portion of the duodenum and in the second portion of the       duodenum. Impression:               - 1 cm sliding hiatal hernia.                           - Erythematous mucosa in the gastric fundus.                            Treated with argon plasma coagulation (APC).                           - Gastric antral vascular ectasia with bleeding.                            Treated with argon plasma coagulation (APC).                           - Erythematous duodenopathy.                           - No specimens collected.

## 2022-11-15 NOTE — Sepsis Progress Note (Signed)
Sepsis protocol monitored by eLink ?

## 2022-11-15 NOTE — Assessment & Plan Note (Signed)
Continue with his psych meds.

## 2022-11-15 NOTE — ED Notes (Signed)
Patient transported to CT 

## 2022-11-15 NOTE — Progress Notes (Signed)
Pharmacy Antibiotic Note  [name] is a 57 y.o. male admitted on 11/15/2022 with UTI.  Pharmacy has been consulted for cefepime dosing.  Plan: Cefepime 2000 mg IV every 8 hours. Monitor labs, c/s, and patient improvement.  Height: 5' 6"$  (167.6 cm) Weight: 100.2 kg (221 lb) IBW/kg (Calculated) : 63.8  No data recorded.  Recent Labs  Lab 11/08/22 1357  WBC 3.6*  CREATININE 0.92    Estimated Creatinine Clearance: 98.2 mL/min (by C-G formula based on SCr of 0.92 mg/dL).    Allergies  Allergen Reactions   Penicillins Anaphylaxis   Bactrim [Sulfamethoxazole-Trimethoprim] Other (See Comments)    ABD CRAMPS AND DIARRHEA Welps/itch   Oxycodone-Acetaminophen Other (See Comments)    Other reaction(s): Swelling   Remicade [Infliximab] Other (See Comments)    COULDN'T BREATHE   Aspirin     REACTION: unknown reaction   Codeine     REACTION: unknown reaction   Ibuprofen     REACTION: unknown reaction   Macrobid [Nitrofurantoin] Itching   Tramadol Nausea Only    Other reaction(s): Nausea   Tramadol Hcl     REACTION: unknown reaction   Vicodin [Hydrocodone-Acetaminophen] Nausea Only    Antimicrobials this admission: Cefepime 2/12 >>  Microbiology results: 2/12 BCx: pending 2/12 UCx: pending  Hx of pseudomonas UTI  Thank you for allowing pharmacy to be a part of this patient's care.  Ramond Craver 11/15/2022 12:33 PM

## 2022-11-15 NOTE — Assessment & Plan Note (Signed)
Stable. 

## 2022-11-16 DIAGNOSIS — K50819 Crohn's disease of both small and large intestine with unspecified complications: Secondary | ICD-10-CM | POA: Diagnosis not present

## 2022-11-16 DIAGNOSIS — I959 Hypotension, unspecified: Secondary | ICD-10-CM | POA: Diagnosis present

## 2022-11-16 DIAGNOSIS — G9341 Metabolic encephalopathy: Secondary | ICD-10-CM | POA: Diagnosis not present

## 2022-11-16 DIAGNOSIS — M069 Rheumatoid arthritis, unspecified: Secondary | ICD-10-CM | POA: Diagnosis present

## 2022-11-16 DIAGNOSIS — D509 Iron deficiency anemia, unspecified: Secondary | ICD-10-CM | POA: Diagnosis present

## 2022-11-16 DIAGNOSIS — E46 Unspecified protein-calorie malnutrition: Secondary | ICD-10-CM | POA: Diagnosis present

## 2022-11-16 DIAGNOSIS — D61818 Other pancytopenia: Secondary | ICD-10-CM | POA: Diagnosis present

## 2022-11-16 DIAGNOSIS — Z86718 Personal history of other venous thrombosis and embolism: Secondary | ICD-10-CM | POA: Diagnosis not present

## 2022-11-16 DIAGNOSIS — E876 Hypokalemia: Secondary | ICD-10-CM | POA: Diagnosis present

## 2022-11-16 DIAGNOSIS — E669 Obesity, unspecified: Secondary | ICD-10-CM | POA: Diagnosis present

## 2022-11-16 DIAGNOSIS — E872 Acidosis, unspecified: Secondary | ICD-10-CM | POA: Diagnosis present

## 2022-11-16 DIAGNOSIS — R627 Adult failure to thrive: Secondary | ICD-10-CM | POA: Insufficient documentation

## 2022-11-16 DIAGNOSIS — I10 Essential (primary) hypertension: Secondary | ICD-10-CM | POA: Diagnosis present

## 2022-11-16 DIAGNOSIS — R7881 Bacteremia: Secondary | ICD-10-CM | POA: Diagnosis present

## 2022-11-16 DIAGNOSIS — F319 Bipolar disorder, unspecified: Secondary | ICD-10-CM | POA: Diagnosis present

## 2022-11-16 DIAGNOSIS — E871 Hypo-osmolality and hyponatremia: Secondary | ICD-10-CM | POA: Diagnosis present

## 2022-11-16 DIAGNOSIS — Z6837 Body mass index (BMI) 37.0-37.9, adult: Secondary | ICD-10-CM | POA: Diagnosis not present

## 2022-11-16 DIAGNOSIS — J90 Pleural effusion, not elsewhere classified: Secondary | ICD-10-CM | POA: Diagnosis not present

## 2022-11-16 DIAGNOSIS — Z1152 Encounter for screening for COVID-19: Secondary | ICD-10-CM | POA: Diagnosis not present

## 2022-11-16 DIAGNOSIS — K5080 Crohn's disease of both small and large intestine without complications: Secondary | ICD-10-CM | POA: Diagnosis present

## 2022-11-16 DIAGNOSIS — K7460 Unspecified cirrhosis of liver: Secondary | ICD-10-CM | POA: Diagnosis present

## 2022-11-16 DIAGNOSIS — N390 Urinary tract infection, site not specified: Secondary | ICD-10-CM | POA: Diagnosis present

## 2022-11-16 DIAGNOSIS — K754 Autoimmune hepatitis: Secondary | ICD-10-CM | POA: Diagnosis present

## 2022-11-16 DIAGNOSIS — Z932 Ileostomy status: Secondary | ICD-10-CM | POA: Diagnosis not present

## 2022-11-16 DIAGNOSIS — K7581 Nonalcoholic steatohepatitis (NASH): Secondary | ICD-10-CM | POA: Diagnosis not present

## 2022-11-16 DIAGNOSIS — Z515 Encounter for palliative care: Secondary | ICD-10-CM | POA: Diagnosis not present

## 2022-11-16 DIAGNOSIS — Y738 Miscellaneous gastroenterology and urology devices associated with adverse incidents, not elsewhere classified: Secondary | ICD-10-CM | POA: Diagnosis present

## 2022-11-16 DIAGNOSIS — N179 Acute kidney failure, unspecified: Secondary | ICD-10-CM | POA: Diagnosis not present

## 2022-11-16 DIAGNOSIS — E11649 Type 2 diabetes mellitus with hypoglycemia without coma: Secondary | ICD-10-CM | POA: Diagnosis not present

## 2022-11-16 DIAGNOSIS — Z66 Do not resuscitate: Secondary | ICD-10-CM | POA: Diagnosis not present

## 2022-11-16 LAB — CBC WITH DIFFERENTIAL/PLATELET
Abs Immature Granulocytes: 0.01 10*3/uL (ref 0.00–0.07)
Basophils Absolute: 0 10*3/uL (ref 0.0–0.1)
Basophils Relative: 0 %
Eosinophils Absolute: 0 10*3/uL (ref 0.0–0.5)
Eosinophils Relative: 1 %
HCT: 24.6 % — ABNORMAL LOW (ref 39.0–52.0)
Hemoglobin: 7.8 g/dL — ABNORMAL LOW (ref 13.0–17.0)
Immature Granulocytes: 0 %
Lymphocytes Relative: 20 %
Lymphs Abs: 0.7 10*3/uL (ref 0.7–4.0)
MCH: 32.5 pg (ref 26.0–34.0)
MCHC: 31.7 g/dL (ref 30.0–36.0)
MCV: 102.5 fL — ABNORMAL HIGH (ref 80.0–100.0)
Monocytes Absolute: 0.5 10*3/uL (ref 0.1–1.0)
Monocytes Relative: 13 %
Neutro Abs: 2.4 10*3/uL (ref 1.7–7.7)
Neutrophils Relative %: 66 %
Platelets: 46 10*3/uL — ABNORMAL LOW (ref 150–400)
RBC: 2.4 MIL/uL — ABNORMAL LOW (ref 4.22–5.81)
RDW: 18.2 % — ABNORMAL HIGH (ref 11.5–15.5)
WBC: 3.6 10*3/uL — ABNORMAL LOW (ref 4.0–10.5)
nRBC: 0 % (ref 0.0–0.2)

## 2022-11-16 LAB — BLOOD CULTURE ID PANEL (REFLEXED) - BCID2

## 2022-11-16 LAB — FOLATE: Folate: 7.7 ng/mL (ref >5.9)

## 2022-11-16 LAB — GLUCOSE, CAPILLARY
Glucose-Capillary: 73 mg/dL (ref 70–99)
Glucose-Capillary: 105 mg/dL — ABNORMAL HIGH (ref 70–99)
Glucose-Capillary: 101 mg/dL — ABNORMAL HIGH (ref 70–99)
Glucose-Capillary: 90 mg/dL (ref 70–99)

## 2022-11-16 LAB — COMPREHENSIVE METABOLIC PANEL
ALT: 18 U/L (ref 0–44)
AST: 43 U/L — ABNORMAL HIGH (ref 15–41)
Albumin: 1.8 g/dL — ABNORMAL LOW (ref 3.5–5.0)
Alkaline Phosphatase: 87 U/L (ref 38–126)
Anion gap: 6 (ref 5–15)
BUN: 10 mg/dL (ref 6–20)
CO2: 23 mmol/L (ref 22–32)
Calcium: 7.9 mg/dL — ABNORMAL LOW (ref 8.9–10.3)
Chloride: 105 mmol/L (ref 98–111)
Creatinine, Ser: 0.95 mg/dL (ref 0.61–1.24)
GFR, Estimated: >60 mL/min (ref >60)
Glucose, Bld: 67 mg/dL — ABNORMAL LOW (ref 70–99)
Potassium: 4.4 mmol/L (ref 3.5–5.1)
Sodium: 134 mmol/L — ABNORMAL LOW (ref 135–145)
Total Bilirubin: 1.4 mg/dL — ABNORMAL HIGH (ref 0.3–1.2)
Total Protein: 5 g/dL — ABNORMAL LOW (ref 6.5–8.1)

## 2022-11-16 LAB — VITAMIN B12: Vitamin B-12: 1156 pg/mL — ABNORMAL HIGH (ref 180–914)

## 2022-11-16 LAB — CK: Total CK: 215 U/L (ref 49–397)

## 2022-11-16 LAB — T4, FREE: Free T4: 1.04 ng/dL (ref 0.61–1.12)

## 2022-11-16 LAB — TSH: TSH: 3.044 u[IU]/mL (ref 0.350–4.500)

## 2022-11-16 LAB — MAGNESIUM: Magnesium: 1.8 mg/dL (ref 1.7–2.4)

## 2022-11-16 MED ORDER — SPIRONOLACTONE 25 MG PO TABS
25.0000 mg | ORAL_TABLET | Freq: Every day | ORAL | Status: DC
  Filled 2022-11-16: qty 1

## 2022-11-16 MED ORDER — SODIUM CHLORIDE 0.9 % IV SOLN
1.0000 g | Freq: Three times a day (TID) | INTRAVENOUS | Status: DC
  Administered 2022-11-16 – 2022-11-18 (×6): 1 g via INTRAVENOUS
  Filled 2022-11-16 (×6): qty 20

## 2022-11-16 MED ORDER — ORAL CARE MOUTH RINSE: 15.0000 mL | OROMUCOSAL | Status: DC | PRN

## 2022-11-16 MED ORDER — TORSEMIDE 20 MG PO TABS
20.0000 mg | ORAL_TABLET | Freq: Every day | ORAL | Status: DC
  Filled 2022-11-16: qty 1

## 2022-11-16 NOTE — Progress Notes (Signed)
PHARMACY - PHYSICIAN COMMUNICATION CRITICAL VALUE ALERT - BLOOD CULTURE IDENTIFICATION (BCID)  [name] is an 57 y.o. male who presented to [facility] on 11/15/2022 with a chief complaint of weakness.   Assessment:  57 year old with blood cultures said to be growing GPC this morning, BCID now showing acinetobacter.   Name of physician (or Provider) Contacted: Tat MD  Current antibiotics: none  Changes to prescribed antibiotics recommended:  Recommendations accepted by provider Will start patient on meropenem 1g q8 hours  Results for orders placed or performed during the hospital encounter of 11/15/22  Blood Culture ID Panel (Reflexed) (Collected: 11/15/2022 12:23 PM)  Result Value Ref Range   Enterococcus faecalis NOT DETECTED NOT DETECTED   Enterococcus Faecium NOT DETECTED NOT DETECTED   Listeria monocytogenes NOT DETECTED NOT DETECTED   Staphylococcus species NOT DETECTED NOT DETECTED   Staphylococcus aureus (BCID) NOT DETECTED NOT DETECTED   Staphylococcus epidermidis NOT DETECTED NOT DETECTED   Staphylococcus lugdunensis NOT DETECTED NOT DETECTED   Streptococcus species NOT DETECTED NOT DETECTED   Streptococcus agalactiae NOT DETECTED NOT DETECTED   Streptococcus pneumoniae NOT DETECTED NOT DETECTED   Streptococcus pyogenes NOT DETECTED NOT DETECTED   A.calcoaceticus-baumannii DETECTED (A) NOT DETECTED   Bacteroides fragilis NOT DETECTED NOT DETECTED   Enterobacterales NOT DETECTED NOT DETECTED   Enterobacter cloacae complex NOT DETECTED NOT DETECTED   Escherichia coli NOT DETECTED NOT DETECTED   Klebsiella aerogenes NOT DETECTED NOT DETECTED   Klebsiella oxytoca NOT DETECTED NOT DETECTED   Klebsiella pneumoniae NOT DETECTED NOT DETECTED   Proteus species NOT DETECTED NOT DETECTED   Salmonella species NOT DETECTED NOT DETECTED   Serratia marcescens NOT DETECTED NOT DETECTED   Haemophilus influenzae NOT DETECTED NOT DETECTED   Neisseria meningitidis NOT DETECTED NOT  DETECTED   Pseudomonas aeruginosa NOT DETECTED NOT DETECTED   Stenotrophomonas maltophilia NOT DETECTED NOT DETECTED   Candida albicans NOT DETECTED NOT DETECTED   Candida auris NOT DETECTED NOT DETECTED   Candida glabrata NOT DETECTED NOT DETECTED   Candida krusei NOT DETECTED NOT DETECTED   Candida parapsilosis NOT DETECTED NOT DETECTED   Candida tropicalis NOT DETECTED NOT DETECTED   Cryptococcus neoformans/gattii NOT DETECTED NOT DETECTED   CTX-M ESBL NOT DETECTED NOT DETECTED   Carbapenem resistance IMP NOT DETECTED NOT DETECTED   Carbapenem resistance KPC NOT DETECTED NOT DETECTED   Carbapenem resistance NDM NOT DETECTED NOT DETECTED   Carbapenem resistance VIM NOT DETECTED NOT DETECTED   Erin Hearing PharmD., BCPS Clinical Pharmacist 11/16/2022 3:45 PM

## 2022-11-16 NOTE — Hospital Course (Signed)
57 year old male with a history of diabetes mellitus type 2, pancytopenia, recurrent DVT/PE on Eliquis, RA, Crohn's colitis status post colectomy with ileostomy 2009 presenting with multiple falls.  The patient is a poor historian. Patient was seen on 05 November 2022 by liver transplant at [facility].  He was noted to have nonalcoholic fatty liver disease along with cirrhosis.  Autoimmune workup was started.  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  Has not had follow-up yet.   Patient been taking Demadex 20 mg a day prescribed by his cardiologist due to lower extremity edema.  Patient has not been started on Aldactone yet. This was mentioned during his hepatology consult. Patient states that he is taking Aldactone but there is no recording of this in his chart.   The patient was recently hospitalized from 09/06/2022 to 09/09/2022 for GI bleed.  EGD  09/07/22 with 1cm hh, Erythematous mucosa in the gastric fundus. Treated w/ (APC).GAVE w/ bleeding. Treated w/ (APC). Erythematous duodenopathy.  The patient was discharged home with pantoprazole twice daily.  In the ED, the patient was noted to be tachycardic in the 110s.  He was hypotensive with systolic blood pressure 83.  The patient was given 3.5 L of fluid.  Lactic acid was 3.7.  He was admitted for further evaluation and treatment of SIRS and altered mentation

## 2022-11-16 NOTE — Evaluation (Signed)
Physical Therapy Evaluation Patient Details Name: [name] MRN: [id number] DOB: [birth year] Today's Date: 11/16/2022  History of Present Illness  57 year old male history of type 2 diabetes, Crohn's disease status post ileostomy, history of recurrent DVT/PE on Eliquis, bipolar disorder who presents to the ER today with multiple falls in the last 24 hours.  Patient is a very poor historian.  He is confused.  He is not oriented to time.  Patient giving confusing answers to his questions.  Instead of stating he does not know the answer the question, patient is making up answers.     Patient was seen on 05 November 2022 by liver transplant at [facility].  He was noted to have nonalcoholic fatty liver disease along with cirrhosis.  Autoimmune workup was started.  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  Has not had follow-up yet.   Clinical Impression  Patient demonstrates slow labored movement for sitting up at bedside with frequent leaning/falling over to the left side once seated, very unsteady on feet and unable to maintain standing balance without AD.  Patient limited to a few side steps at bedside before having to sit due to near loss of balance leaning to the left and tolerated sitting up in chair after therapy - nursing staff notified.  Patient will benefit from continued skilled physical therapy in hospital and recommended venue below to increase strength, balance, endurance for safe ADLs and gait.         Recommendations for follow up therapy are one component of a multi-disciplinary discharge planning process, led by the attending physician.  Recommendations may be updated based on patient status, additional functional criteria and insurance authorization.  Follow Up Recommendations Skilled nursing-short term rehab (<3 hours/day) Can patient physically be transported by private vehicle: No    Assistance Recommended at Discharge    Patient can return home with the  following  A lot of help with bathing/dressing/bathroom;A lot of help with walking and/or transfers;Help with stairs or ramp for entrance;Assistance with cooking/housework    Equipment Recommendations None recommended by PT  Recommendations for Other Services       Functional Status Assessment Patient has had a recent decline in their functional status and demonstrates the ability to make significant improvements in function in a reasonable and predictable amount of time.     Precautions / Restrictions Precautions Precautions: Fall Restrictions Weight Bearing Restrictions: No      Mobility  Bed Mobility Overal bed mobility: Needs Assistance Bed Mobility: Supine to Sit     Supine to sit: Mod assist     General bed mobility comments: increased time, labored movement    Transfers Overall transfer level: Needs assistance Equipment used: Rolling walker (2 wheels) Transfers: Sit to/from Stand, Bed to chair/wheelchair/BSC Sit to Stand: Mod assist   Step pivot transfers: Mod assist       General transfer comment: very unsteady on feet, unable to maintain standing balance without AD, required use of RW for safety    Ambulation/Gait Ambulation/Gait assistance: Mod assist, Max assist Gait Distance (Feet): 4 Feet Assistive device: Rolling walker (2 wheels) Gait Pattern/deviations: Decreased step length - right, Decreased step length - left, Decreased stride length Gait velocity: slow     General Gait Details: limited to a few side steps at bedside with frequent leaning/falling over to the left, unsafe to take steps away from bedside  Stairs            Wheelchair Mobility  Modified Rankin (Stroke Patients Only)       Balance Overall balance assessment: Needs assistance Sitting-balance support: Feet supported, No upper extremity supported Sitting balance-Leahy Scale: Poor Sitting balance - Comments: fair/poor seated at EOB   Standing balance support:  During functional activity, Bilateral upper extremity supported Standing balance-Leahy Scale: Poor Standing balance comment: using RW                             Pertinent Vitals/Pain Pain Assessment Pain Assessment: No/denies pain    Home Living Family/patient expects to be discharged to:: Private residence Living Arrangements: Alone Available Help at Discharge: Personal care attendant Type of Home: Apartment Home Access: Level entry       Home Layout: One level Home Equipment: Rollator (4 wheels);Cane - quad;Shower seat;Grab bars - tub/shower Additional Comments: patient has a caregiver who comes in every day for 3 hours and assists with cleaning    Prior Function Prior Level of Function : Needs assist       Physical Assist : Mobility (physical);ADLs (physical) Mobility (physical): Bed mobility;Transfers;Gait;Stairs   Mobility Comments: Household ambulator with assistance, "per patient" ADLs Comments: assisted by home aide 3 hours daily x 7 days/week     Hand Dominance        Extremity/Trunk Assessment   Upper Extremity Assessment Upper Extremity Assessment: Generalized weakness    Lower Extremity Assessment Lower Extremity Assessment: Generalized weakness    Cervical / Trunk Assessment Cervical / Trunk Assessment: Normal  Communication   Communication: No difficulties  Cognition Arousal/Alertness: Awake/alert Behavior During Therapy: WFL for tasks assessed/performed Overall Cognitive Status: Within Functional Limits for tasks assessed                                          General Comments      Exercises     Assessment/Plan    PT Assessment Patient needs continued PT services  PT Problem List Decreased strength;Decreased activity tolerance;Decreased balance;Decreased mobility       PT Treatment Interventions DME instruction;Gait training;Stair training;Functional mobility training;Therapeutic  activities;Therapeutic exercise;Balance training;Patient/family education    PT Goals (Current goals can be found in the Care Plan section)  Acute Rehab PT Goals Patient Stated Goal: return home with home aides to assist PT Goal Formulation: With patient Time For Goal Achievement: 11/30/22 Potential to Achieve Goals: Good    Frequency Min 3X/week     Co-evaluation               AM-PAC PT "6 Clicks" Mobility  Outcome Measure Help needed turning from your back to your side while in a flat bed without using bedrails?: A Lot Help needed moving from lying on your back to sitting on the side of a flat bed without using bedrails?: A Lot Help needed moving to and from a bed to a chair (including a wheelchair)?: A Lot Help needed standing up from a chair using your arms (e.g., wheelchair or bedside chair)?: A Lot Help needed to walk in hospital room?: A Lot Help needed climbing 3-5 steps with a railing? : Total 6 Click Score: 11    End of Session   Activity Tolerance: Patient tolerated treatment well;Patient limited by fatigue Patient left: in chair;with call bell/phone within reach;with chair alarm set Nurse Communication: Mobility status PT Visit Diagnosis: Unsteadiness on feet (R26.81);Other abnormalities of  gait and mobility (R26.89);Muscle weakness (generalized) (M62.81)    Time: [id number] PT Time Calculation (min) (ACUTE ONLY): 27 min   Charges:   PT Evaluation $PT Eval Moderate Complexity: 1 Mod PT Treatments $Therapeutic Activity: 23-37 mins        12:22 PM, 11/16/22 [name], MPT Physical Therapist with [facility] [phone] office [phone] mobile phone

## 2022-11-16 NOTE — TOC Initial Note (Signed)
Transition of Care [facility]) - Initial/Assessment Note    Patient Details  Name: [name] MRN: [id number] Date of Birth: Dec 28, 1965  Transition of Care [facility]) CM/SW Contact:    Boneta Lucks, RN Phone Number: 11/16/2022, 11:14 AM  Clinical Narrative:     Patient admitted with Hypokalemia. TOC spoke with his mother. He lives home alone, uses transportation services to get to appointments. He did go to outpatient PT, that was ordered last admission. He walks with a walker. He has [employer] aide that come in daily, they role is mostly cleaning per his mother. PT is recommending SNF, patient states he has 24/7 assist as needed. His mother states family checks on him daily and he seems to be doing okay. Patient wants to go home. He will not be willing to give up check to go to SNF for 30 days. TOC to follow.               Expected Discharge Plan: Home/Self Care Barriers to Discharge: Continued Medical Work up  Patient Goals and CMS Choice Patient states their goals for this hospitalization and ongoing recovery are:: to go home. CMS Medicare.gov Compare Post Acute Care list provided to:: Patient Represenative (must comment) Choice offered to / list presented to : Parent    Expected Discharge Plan and Services     Prior Living Arrangements/Services   Lives with:: Self   Activities of Daily Living Home Assistive Devices/Equipment: Environmental consultant (specify type), Eyeglasses ADL Screening (condition at time of admission) Patient's cognitive ability adequate to safely complete daily activities?: Yes Is the patient deaf or have difficulty hearing?: No Does the patient have difficulty seeing, even when wearing glasses/contacts?: No Does the patient have difficulty concentrating, remembering, or making decisions?: Yes Patient able to express need for assistance with ADLs?: Yes Does the patient have difficulty dressing or bathing?: Yes Independently performs ADLs?: No Communication:  Independent Dressing (OT): Needs assistance Is this a change from baseline?: Change from baseline, expected to last <3days Grooming: Independent Feeding: Independent Bathing: Needs assistance Is this a change from baseline?: Pre-admission baseline Toileting: Needs assistance Is this a change from baseline?: Change from baseline, expected to last <3 days In/Out Bed: Needs assistance Is this a change from baseline?: Change from baseline, expected to last <3 days Walks in Home: Independent with device (comment) Does the patient have difficulty walking or climbing stairs?: Yes Weakness of Legs: Both Weakness of Arms/Hands: Both  Permission Sought/Granted  Emotional Assessment  Alcohol / Substance Use: Not Applicable Psych Involvement: No (comment)  Admission diagnosis:  Hypokalemia [E87.6] Patient Active Problem List   Diagnosis Date Noted   Hypokalemia 11/15/2022   Liver cirrhosis secondary to nonalcoholic steatohepatitis (NASH) ([place]) 11/15/2022   Pleural effusion on right 11/15/2022   Pancytopenia ([place]) 09/07/2022   Hypoalbuminemia due to protein-calorie malnutrition ([place]) 09/07/2022   History of DVT (deep vein thrombosis) 09/07/2022   History of pulmonary embolism 09/07/2022   Iron deficiency anemia 06/17/2022   Bladder neck contracture [id number]   Acute metabolic encephalopathy [id number]   Hyponatremia 05/16/2021   DM type 2 (diabetes mellitus, type 2) ([place]) 05/16/2021   Recurrent deep vein thrombosis (DVT) ([place]) 07/28/2020   At risk for osteopenia 02/06/2020   BPH (benign prostatic hyperplasia) 12/03/2019   Incomplete emptying of bladder 10/31/2019   Benign prostatic hyperplasia with urinary obstruction 10/31/2019   Lactic acidosis 02/10/2018   Arthritis of knee, degenerative 02/14/2013   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 [place] [address] Phone: [phone] Fax: [phone] - [place], [place] - [place] [address] Phone: [phone] Fax: [phone]   Social Determinants of Health (SDOH) Social History: [place]: Food Insecurity Present (09/07/2022)  Housing: Low Risk  (09/07/2022)  Transportation Needs: No Transportation Needs (09/07/2022)  Utilities: Not At Risk (09/07/2022)  Tobacco Use: High Risk (11/15/2022)   SDOH Interventions: Food Insecurity Interventions: Inpatient TOC ([facility] nurses in the home to provide meals)

## 2022-11-16 NOTE — Progress Notes (Signed)
PROGRESS NOTE  [name] [id number] DOB: 1966-01-20 DOA: 11/15/2022 PCP: [facility]  Brief History:  57 year old male with a history of diabetes mellitus type 2, pancytopenia, recurrent DVT/PE on Eliquis, RA, Crohn's colitis status post colectomy with ileostomy 2009 presenting with multiple falls.  The patient is a poor historian. Patient was seen on 05 November 2022 by liver transplant at [facility].  He was noted to have nonalcoholic fatty liver disease along with cirrhosis.  Autoimmune workup was started.  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  Has not had follow-up yet.   Patient been taking Demadex 20 mg a day prescribed by his cardiologist due to lower extremity edema.  Patient has not been started on Aldactone yet. This was mentioned during his hepatology consult. Patient states that he is taking Aldactone but there is no recording of this in his chart.   The patient was recently hospitalized from 09/06/2022 to 09/09/2022 for GI bleed.  EGD  09/07/22 with 1cm hh, Erythematous mucosa in the gastric fundus. Treated w/ (APC).GAVE w/ bleeding. Treated w/ (APC). Erythematous duodenopathy.  The patient was discharged home with pantoprazole twice daily.  In the ED, the patient was noted to be tachycardic in the 110s.  He was hypotensive with systolic blood pressure 83.  The patient was given 3.5 L of fluid.  Lactic acid was 3.7.  He was admitted for further evaluation and treatment of SIRS and altered mentation    Assessment/Plan: Frequent falls/failure to thrive -TSH -B12 -Folate -PT evaluation>>SNF -UA negative for pyuria  Generalized weakness -Multifactorial including deconditioning, hypokalemia, possible infectious process -Workup as above -Check CPK  Urinary retention -Foley catheter placed in the ED -CT shows tip of the catheter is in the prostatic urethra -Requested nursing to advance/adjust foley  Acute metabolic  encephalopathy -Patient remains confused -Suspect he will clear has underlying chronic tear of impairment -Ammonia 30 -Workup as above -CT brain negative -UA negative for pyuria -Will discuss with the patient's mother  Lactic acidosis -Suspect this is due to decreased clearance from his hepatic cirrhosis -Follow cultures  Hypokalemia -Replete -Check magnesium--1.8  Liver cirrhosis secondary to nonalcoholic steatohepatitis (NASH)  Pt was seen by transplant hepatology at [facility] on 11-05-2022. He has outpatient labs that showed + ANA and + anti-smooth muscle antibody. This calls into question whether his cirrhosis is caused by autoimmune hepatitis   Crohn's disease of both small and large intestine with complication ([place]) Pt with ileostomy.  Pt denies any high output from ileostomy  Right pleural effusion -He is stable on room air -due to hepatic hydrothorax  Bipolar -continue depakote, risperdal, sertraline, benztropine  Mixed Hyperlipidemia -continue statin  History DVTs/PE -continue apixaban  Pancytopenia -chronic -follows hematology -monitor for signs of bleed  Class 2 Obese -BMI 37.04    Family Communication:   mother updated 2/13  Consultants:  none  Code Status:  FULL   DVT Prophylaxis:  SCDs   Procedures: As Listed in Progress Note Above  Antibiotics: None     Subjective: Patient denies fevers, chills, headache, chest pain, dyspnea, nausea, vomiting, diarrhea, abdominal pain, dysuria, hematuria, hematochezia, and melena.   Objective: Vitals:   11/15/22 1922 11/15/22 2336 11/16/22 0500 11/16/22 0859  BP: 138/79 (!) 104/90 (!) 106/54 (!) 104/51  Pulse: (!) 108 98 96 100  Resp: 20 20 18 18  $ Temp: 97.6 F (36.4 C) 98.2 F (36.8 C) 97.7 F (36.5  C) 97.6 F (36.4 C)  TempSrc:    Oral  SpO2: 96%   99%  Weight:   104.1 kg   Height:        Intake/Output Summary (Last 24 hours) at 11/16/2022 1101 Last data filed at 11/16/2022 1014 Gross per  24 hour  Intake 1490.1 ml  Output 1200 ml  Net 290.1 ml   Weight change:  Exam:  General:  Pt is alert, follows commands appropriately, not in acute distress HEENT: No icterus, No thrush, No neck mass, [place]/AT Cardiovascular: RRR, S1/S2, no rubs, no gallops Respiratory: bibasilar rales.  No wheeze Abdomen: Soft/+BS, non tender, non distended, no guarding Extremities: 2 + LE edema, No lymphangitis, No petechiae, No rashes, no synovitis   Data Reviewed: I have personally reviewed following labs and imaging studies Basic Metabolic Panel: Recent Labs  Lab 11/15/22 1223 11/15/22 1254 11/16/22 0417  NA 132* 136 134*  K 2.6* 2.8* 4.4  CL 99 97* 105  CO2 25  --  23  GLUCOSE 95 91 67*  BUN 11 10 10  $ CREATININE 1.15 1.20 0.95  CALCIUM 8.3*  --  7.9*  MG 1.8  --  1.8   Liver Function Tests: Recent Labs  Lab 11/15/22 1223 11/16/22 0417  AST 44* 43*  ALT 19 18  ALKPHOS 98 87  BILITOT 1.1 1.4*  PROT 5.5* 5.0*  ALBUMIN 1.9* 1.8*   No results for input(s): "LIPASE", "AMYLASE" in the last 168 hours. Recent Labs  Lab 11/15/22 1905  AMMONIA 30   Coagulation Profile: No results for input(s): "INR", "PROTIME" in the last 168 hours. CBC: Recent Labs  Lab 11/15/22 1223 11/15/22 1254 11/16/22 0417  WBC 2.3*  --  3.6*  NEUTROABS 1.2*  --  2.4  HGB 8.7* 9.2* 7.8*  HCT 27.0* 27.0* 24.6*  MCV 102.7*  --  102.5*  PLT 46*  --  46*   Cardiac Enzymes: No results for input(s): "CKTOTAL", "CKMB", "CKMBINDEX", "TROPONINI" in the last 168 hours. BNP: Invalid input(s): "POCBNP" CBG: Recent Labs  Lab 11/15/22 2135 11/16/22 0800  GLUCAP 78 73   HbA1C: No results for input(s): "HGBA1C" in the last 72 hours. Urine analysis:    Component Value Date/Time   COLORURINE YELLOW 11/15/2022 1327   APPEARANCEUR CLEAR 11/15/2022 1327   APPEARANCEUR Cloudy (A) 08/25/2022 1417   LABSPEC 1.012 11/15/2022 1327   PHURINE 6.0 11/15/2022 1327   GLUCOSEU NEGATIVE 11/15/2022 1327   HGBUR  NEGATIVE 11/15/2022 1327   BILIRUBINUR NEGATIVE 11/15/2022 1327   BILIRUBINUR Negative 08/25/2022 1417   KETONESUR NEGATIVE 11/15/2022 1327   PROTEINUR NEGATIVE 11/15/2022 1327   UROBILINOGEN 0.2 10/17/2014 1940   NITRITE NEGATIVE 11/15/2022 1327   LEUKOCYTESUR NEGATIVE 11/15/2022 1327   Sepsis Labs: @LABRCNTIP$ (procalcitonin:4,lacticidven:4) ) Recent Results (from the past 240 hour(s))  Blood Culture (routine x 2)     Status: None (Preliminary result)   Collection Time: 11/15/22 12:23 PM   Specimen: BLOOD RIGHT HAND  Result Value Ref Range Status   Specimen Description BLOOD RIGHT HAND  Final   Special Requests   Final    BOTTLES DRAWN AEROBIC AND ANAEROBIC Blood Culture adequate volume   Culture  Setup Time   Final    GRAM POSITIVE COCCI BOTTLES DRAWN AEROBIC AND ANAEROBIC Gram Stain Report Called to,Read Back By and Verified With: DILDY,[email or web address]$  BY MATTHEWS B 2.13.2024 Performed at [facility], [address] [place], [place] [address]    Culture PENDING  Incomplete   Report Status PENDING  Incomplete  Blood Culture (routine x 2)     Status: None (Preliminary result)   Collection Time: 11/15/22 12:28 PM   Specimen: BLOOD LEFT HAND  Result Value Ref Range Status   Specimen Description BLOOD LEFT HAND  Final   Special Requests   Final    BOTTLES DRAWN AEROBIC ONLY Blood Culture results may not be optimal due to an inadequate volume of blood received in culture bottles Performed at [facility], [address], [place], [place] [address]    Culture PENDING  Incomplete   Report Status PENDING  Incomplete  Resp panel by RT-PCR (RSV, Flu A&B, Covid) Anterior Nasal Swab     Status: None   Collection Time: 11/15/22  1:28 PM   Specimen: Anterior Nasal Swab  Result Value Ref Range Status   SARS Coronavirus 2 by RT PCR NEGATIVE NEGATIVE Final    Comment: (NOTE) SARS-CoV-2 target nucleic acids are NOT DETECTED.  The SARS-CoV-2 RNA is generally detectable in upper  respiratory specimens during the acute phase of infection. The lowest concentration of SARS-CoV-2 viral copies this assay can detect is 138 copies/mL. A negative result does not preclude SARS-Cov-2 infection and should not be used as the sole basis for treatment or other patient management decisions. A negative result may occur with  improper specimen collection/handling, submission of specimen other than nasopharyngeal swab, presence of viral mutation(s) within the areas targeted by this assay, and inadequate number of viral copies(<138 copies/mL). A negative result must be combined with clinical observations, patient history, and epidemiological information. The expected result is Negative.  Fact Sheet for Patients:  EntrepreneurPulse.com.au  Fact Sheet for Healthcare Providers:  IncredibleEmployment.be  This test is no t yet approved or cleared by the [place] FDA and  has been authorized for detection and/or diagnosis of SARS-CoV-2 by FDA under an Emergency Use Authorization (EUA). This EUA will remain  in effect (meaning this test can be used) for the duration of the COVID-19 declaration under Section 564(b)(1) of the Act, 21 U.S.C.section 360bbb-3(b)(1), unless the authorization is terminated  or revoked sooner.       Influenza A by PCR NEGATIVE NEGATIVE Final   Influenza B by PCR NEGATIVE NEGATIVE Final    Comment: (NOTE) The Xpert Xpress SARS-CoV-2/FLU/RSV plus assay is intended as an aid in the diagnosis of influenza from Nasopharyngeal swab specimens and should not be used as a sole basis for treatment. Nasal washings and aspirates are unacceptable for Xpert Xpress SARS-CoV-2/FLU/RSV testing.  Fact Sheet for Patients: EntrepreneurPulse.com.au  Fact Sheet for Healthcare Providers: IncredibleEmployment.be  This test is not yet approved or cleared by the [place] FDA and has been  authorized for detection and/or diagnosis of SARS-CoV-2 by FDA under an Emergency Use Authorization (EUA). This EUA will remain in effect (meaning this test can be used) for the duration of the COVID-19 declaration under Section 564(b)(1) of the Act, 21 U.S.C. section 360bbb-3(b)(1), unless the authorization is terminated or revoked.     Resp Syncytial Virus by PCR NEGATIVE NEGATIVE Final    Comment: (NOTE) Fact Sheet for Patients: EntrepreneurPulse.com.au  Fact Sheet for Healthcare Providers: IncredibleEmployment.be  This test is not yet approved or cleared by the [place] FDA and has been authorized for detection and/or diagnosis of SARS-CoV-2 by FDA under an Emergency Use Authorization (EUA). This EUA will remain in effect (meaning this test can be used) for the duration of the COVID-19 declaration under Section 564(b)(1) of the Act, 21 U.S.C. section 360bbb-3(b)(1), unless  the authorization is terminated or revoked.  Performed at [facility], [address] [place], [place] [address]      Scheduled Meds:  apixaban  5 mg Oral BID   atorvastatin  80 mg Oral Daily   benztropine  0.5 mg Oral Daily   divalproex  1,000 mg Oral QHS   divalproex  250 mg Oral Daily   hydroxychloroquine  200 mg Oral BID   insulin aspart  0-5 Units Subcutaneous QHS   insulin aspart  0-9 Units Subcutaneous TID WC   metoprolol succinate  25 mg Oral Daily   pantoprazole  40 mg Oral BID   risperiDONE  1 mg Oral QHS   risperiDONE  6 mg Oral QHS   sertraline  50 mg Oral Daily   traZODone  100 mg Oral QHS   Continuous Infusions:  Procedures/Studies: CT CHEST ABDOMEN PELVIS W CONTRAST  Result Date: 11/15/2022 CLINICAL DATA:  Sepsis, polytrauma, fall EXAM: CT CHEST, ABDOMEN, AND PELVIS WITH CONTRAST TECHNIQUE: Multidetector CT imaging of the chest, abdomen and pelvis was performed following the standard protocol during bolus administration of intravenous  contrast. RADIATION DOSE REDUCTION: This exam was performed according to the departmental dose-optimization program which includes automated exposure control, adjustment of the mA and/or kV according to patient size and/or use of iterative reconstruction technique. CONTRAST:  183m OMNIPAQUE IOHEXOL 300 MG/ML  SOLN COMPARISON:  CT abdomen pelvis, 09/06/2022 CT chest, 04/15/2022 FINDINGS: CT CHEST FINDINGS Cardiovascular: Right chest port catheter. Aortic atherosclerosis. Normal heart size. Left coronary artery calcifications. No pericardial effusion. Mediastinum/Nodes: No enlarged mediastinal, hilar, or axillary lymph nodes. Small incidental diverticulum of the posterior right trachea at the level of the thoracic inlet (series 2, image 8). Thyroid gland and esophagus demonstrate no significant findings. Lungs/Pleura: Moderate right pleural effusion and associated atelectasis or consolidation. Musculoskeletal: No chest wall abnormality. No acute osseous findings. Status post bilateral shoulder arthroplasty. CT ABDOMEN PELVIS FINDINGS Hepatobiliary: Coarse contour of the liver. No gallstones, gallbladder wall thickening, or biliary dilatation. Pancreas: Unremarkable. No pancreatic ductal dilatation or surrounding inflammatory changes. Spleen: Normal in size without significant abnormality. Adrenals/Urinary Tract: Adrenal glands are unremarkable. Kidneys are normal, without renal calculi, solid lesion, or hydronephrosis. Malpositioned Foley catheter, tip and balloon within the prostatic or bulbous urethra (series 5, image 111) Stomach/Bowel: Stomach is within normal limits. 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 Vascular/Lymphatic: Aortic atherosclerosis. 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 No enlarged abdominal or pelvic lymph nodes. Reproductive: No mass or other abnormality. Other: No abdominal wall hernia or abnormality. Small volume perihepatic ascites. Musculoskeletal: No acute  osseous findings. 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 IMPRESSION: 1. No CT evidence of acute traumatic injury to the chest, abdomen, or pelvis. 2. Moderate right pleural effusion and associated atelectasis or consolidation. 3. Malpositioned Foley catheter, tip and balloon within the prostatic or bulbous urethra. Recommend repositioning. 4. 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 5. Small volume perihepatic ascites. 6. Coarse contour of the liver and gastric varices, suggestive of cirrhosis. 7. Coronary artery disease. Aortic Atherosclerosis (ICD10-I70.0). Electronically Signed   By: ADelanna AhmadiM.D.   On: 11/15/2022 14:32   CT Head Wo Contrast  Result Date: 11/15/2022 CLINICAL DATA:  Three falls in the past 24 hours. EXAM: CT HEAD WITHOUT CONTRAST CT CERVICAL SPINE WITHOUT CONTRAST TECHNIQUE: Multidetector CT imaging of the head and cervical spine was performed following the standard protocol without intravenous contrast. Multiplanar CT  image reconstructions of the cervical spine were also generated. RADIATION DOSE REDUCTION: This exam was performed according to the departmental dose-optimization program which includes automated exposure control, adjustment of the mA and/or kV according to patient size and/or use of iterative reconstruction technique. COMPARISON:  CT head 05/16/2021 FINDINGS: CT HEAD FINDINGS Brain: There is no acute intracranial hemorrhage, extra-axial fluid collection, or acute infarct. Parenchymal volume is normal. The ventricles are normal in size. Gray-white differentiation is preserved. The pituitary and suprasellar region are normal. There is no mass lesion. There is no mass effect or midline shift. Vascular: No hyperdense vessel or unexpected calcification. Skull: Normal. Negative for fracture or focal lesion. Sinuses/Orbits: There is mild mucosal thickening in the paranasal sinuses. The globes and orbits are  unremarkable. Other: None. CT CERVICAL SPINE FINDINGS Alignment: Normal. There is no jumped or perched facet or other evidence of traumatic malalignment. Skull base and vertebrae: Skull base alignment is maintained. Vertebral body heights are preserved. There is no evidence of acute fracture. There is no suspicious osseous lesion. Soft tissues and spinal canal: No prevertebral fluid or swelling. No visible canal hematoma. Disc levels: Facet arthropathy is most advanced on the right at C3-C4. There is overall mild disc space narrowing at C4-C5 and C5-C6. There is no convincing high-grade spinal canal stenosis. Upper chest: Assessed on the separately dictated CT chest. Other: None. IMPRESSION: 1. No acute intracranial pathology. 2. No acute fracture or traumatic malalignment of the cervical spine. Electronically Signed   By: Valetta Mole M.D.   On: 11/15/2022 14:24   CT Cervical Spine Wo Contrast  Result Date: 11/15/2022 CLINICAL DATA:  Three falls in the past 24 hours. EXAM: CT HEAD WITHOUT CONTRAST CT CERVICAL SPINE WITHOUT CONTRAST TECHNIQUE: Multidetector CT imaging of the head and cervical spine was performed following the standard protocol without intravenous contrast. Multiplanar CT image reconstructions of the cervical spine were also generated. RADIATION DOSE REDUCTION: This exam was performed according to the departmental dose-optimization program which includes automated exposure control, adjustment of the mA and/or kV according to patient size and/or use of iterative reconstruction technique. COMPARISON:  CT head 05/16/2021 FINDINGS: CT HEAD FINDINGS Brain: There is no acute intracranial hemorrhage, extra-axial fluid collection, or acute infarct. Parenchymal volume is normal. The ventricles are normal in size. Gray-white differentiation is preserved. The pituitary and suprasellar region are normal. There is no mass lesion. There is no mass effect or midline shift. Vascular: No hyperdense vessel or  unexpected calcification. Skull: Normal. Negative for fracture or focal lesion. Sinuses/Orbits: There is mild mucosal thickening in the paranasal sinuses. The globes and orbits are unremarkable. Other: None. CT CERVICAL SPINE FINDINGS Alignment: Normal. There is no jumped or perched facet or other evidence of traumatic malalignment. Skull base and vertebrae: Skull base alignment is maintained. Vertebral body heights are preserved. There is no evidence of acute fracture. There is no suspicious osseous lesion. Soft tissues and spinal canal: No prevertebral fluid or swelling. No visible canal hematoma. Disc levels: Facet arthropathy is most advanced on the right at C3-C4. There is overall mild disc space narrowing at C4-C5 and C5-C6. There is no convincing high-grade spinal canal stenosis. Upper chest: Assessed on the separately dictated CT chest. Other: None. IMPRESSION: 1. No acute intracranial pathology. 2. No acute fracture or traumatic malalignment of the cervical spine. Electronically Signed   By: Valetta Mole M.D.   On: 11/15/2022 14:24   DG Chest Port 1 View  Result Date: 11/15/2022 CLINICAL DATA:  Sepsis, possible  UTI EXAM: PORTABLE CHEST 1 VIEW COMPARISON:  CT chest dated 03/16/2022 FINDINGS: Lungs are clear.  No pleural effusion or pneumothorax. The heart is normal in size. Right chest power port terminates at the cavoatrial junction. Bilateral shoulder arthroplasty. IMPRESSION: No evidence of acute cardiopulmonary disease. Electronically Signed   By: Julian Hy M.D.   On: 11/15/2022 13:35    Orson Eva, DO  [facility]  If 7PM-7AM, please contact night-coverage [email or web address] Password [email or web address] 11/16/2022, 11:01 AM   LOS: 0 days

## 2022-11-16 NOTE — Progress Notes (Signed)
Pt A&O x 1-2. Pt slept through the night with respirations even and unlabored.

## 2022-11-16 NOTE — Plan of Care (Signed)
  Problem: Acute Rehab PT Goals(only PT should resolve) Goal: Pt Will Go Supine/Side To Sit Outcome: Progressing Flowsheets (Taken 11/16/2022 1223) Pt will go Supine/Side to Sit:  with min guard assist  with minimal assist Goal: Patient Will Transfer Sit To/From Stand Outcome: Progressing Flowsheets (Taken 11/16/2022 1223) Patient will transfer sit to/from stand: with minimal assist Goal: Pt Will Transfer Bed To Chair/Chair To Bed Outcome: Progressing Flowsheets (Taken 11/16/2022 1223) Pt will Transfer Bed to Chair/Chair to Bed: with min assist Goal: Pt Will Ambulate Outcome: Progressing Flowsheets (Taken 11/16/2022 1223) Pt will Ambulate:  25 feet  with minimal assist  with moderate assist  with rolling walker   12:24 PM, 11/16/22 [name], MPT Physical Therapist with [id number] [facility] [phone] office [phone] mobile phone

## 2022-11-17 DIAGNOSIS — F319 Bipolar disorder, unspecified: Secondary | ICD-10-CM

## 2022-11-17 DIAGNOSIS — Z86718 Personal history of other venous thrombosis and embolism: Secondary | ICD-10-CM | POA: Diagnosis not present

## 2022-11-17 DIAGNOSIS — J90 Pleural effusion, not elsewhere classified: Secondary | ICD-10-CM | POA: Diagnosis not present

## 2022-11-17 DIAGNOSIS — E876 Hypokalemia: Secondary | ICD-10-CM | POA: Diagnosis not present

## 2022-11-17 DIAGNOSIS — K50819 Crohn's disease of both small and large intestine with unspecified complications: Secondary | ICD-10-CM | POA: Diagnosis not present

## 2022-11-17 LAB — BASIC METABOLIC PANEL
Anion gap: 3 — ABNORMAL LOW (ref 5–15)
BUN: 14 mg/dL (ref 6–20)
CO2: 24 mmol/L (ref 22–32)
Calcium: 7.7 mg/dL — ABNORMAL LOW (ref 8.9–10.3)
Chloride: 106 mmol/L (ref 98–111)
Creatinine, Ser: 1.02 mg/dL (ref 0.61–1.24)
GFR, Estimated: >60 mL/min (ref >60)
Glucose, Bld: 77 mg/dL (ref 70–99)
Potassium: 4.8 mmol/L (ref 3.5–5.1)
Sodium: 133 mmol/L — ABNORMAL LOW (ref 135–145)

## 2022-11-17 LAB — CBC
HCT: 22.9 % — ABNORMAL LOW (ref 39.0–52.0)
Hemoglobin: 7.2 g/dL — ABNORMAL LOW (ref 13.0–17.0)
MCH: 32.9 pg (ref 26.0–34.0)
MCHC: 31.4 g/dL (ref 30.0–36.0)
MCV: 104.6 fL — ABNORMAL HIGH (ref 80.0–100.0)
Platelets: 47 10*3/uL — ABNORMAL LOW (ref 150–400)
RBC: 2.19 MIL/uL — ABNORMAL LOW (ref 4.22–5.81)
RDW: 18.6 % — ABNORMAL HIGH (ref 11.5–15.5)
WBC: 3.8 10*3/uL — ABNORMAL LOW (ref 4.0–10.5)
nRBC: 0 % (ref 0.0–0.2)

## 2022-11-17 LAB — GLUCOSE, CAPILLARY
Glucose-Capillary: 115 mg/dL — ABNORMAL HIGH (ref 70–99)
Glucose-Capillary: 105 mg/dL — ABNORMAL HIGH (ref 70–99)
Glucose-Capillary: 97 mg/dL (ref 70–99)
Glucose-Capillary: 128 mg/dL — ABNORMAL HIGH (ref 70–99)

## 2022-11-17 LAB — AMMONIA: Ammonia: 66 umol/L — ABNORMAL HIGH (ref 9–35)

## 2022-11-17 LAB — MAGNESIUM: Magnesium: 2 mg/dL (ref 1.7–2.4)

## 2022-11-17 NOTE — Progress Notes (Signed)
       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

## 2022-11-17 NOTE — Progress Notes (Signed)
Dr. Lucy Antigua notified of patients HGB of 7.2 for the morning draw. No new orders at this time.

## 2022-11-17 NOTE — Progress Notes (Signed)
Order to hold eliquis per Dr. Lucy Antigua, due to bloody urine.

## 2022-11-17 NOTE — Plan of Care (Signed)
  Problem: Nutrition: Goal: Adequate nutrition will be maintained Outcome: Progressing   Problem: Elimination: Goal: Will not experience complications related to bowel motility Outcome: Progressing   

## 2022-11-17 NOTE — Consult Note (Signed)
WOC requested bedside telehealth consult using remote telehealth cart and requested image from provider in order to provide guidance for needed ostomy supplies.  Patient with ileostomy for 10+ years, managed at home.  Once WOC determines either home management/supplies or views image of current pouching I can provide [place]# for needed items.  Leaking in ileostomy can be related to overfilling many times. Will need to be very diligent of the need for frequent emptying of pouch.   [name], [place], [place]

## 2022-11-17 NOTE — Progress Notes (Signed)
PROGRESS NOTE  DEMARRIO BONADIO [id number] DOB: July 06, 1966 DOA: 11/15/2022 PCP: [facility]  Brief History:  57 year old male with a history of diabetes mellitus type 2, pancytopenia, recurrent DVT/PE on Eliquis, RA, Crohn's colitis status post colectomy with ileostomy 2009 presenting with multiple falls.  The patient is a poor historian. Patient was seen on 05 November 2022 by liver transplant at [facility].  He was noted to have nonalcoholic fatty liver disease along with cirrhosis.  Autoimmune workup was started.  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  Has not had follow-up yet.   Patient been taking Demadex 20 mg a day prescribed by his cardiologist due to lower extremity edema.  Patient has not been started on Aldactone yet. This was mentioned during his hepatology consult. Patient states that he is taking Aldactone but there is no recording of this in his chart.   The patient was recently hospitalized from 09/06/2022 to 09/09/2022 for GI bleed.  EGD  09/07/22 with 1cm hh, Erythematous mucosa in the gastric fundus. Treated w/ (APC).GAVE w/ bleeding. Treated w/ (APC). Erythematous duodenopathy.  The patient was discharged home with pantoprazole twice daily.  In the ED, the patient was noted to be tachycardic in the 110s.  He was hypotensive with systolic blood pressure 83.  The patient was given 3.5 L of fluid.  Lactic acid was 3.7.  He was admitted for further evaluation and treatment of SIRS and altered mentation    Assessment/Plan: Frequent falls/failure to thrive -TSH, B12, folate and ammonia within normal limits -Continue supportive care -Follow clinical response.  Generalized weakness -Multifactorial including deconditioning, hypokalemia, possible infectious process. -Continue current IV antibiotics -Follow physical therapy evaluation/recommendations. -Currently with recommendation for a skilled nursing facility at discharge once  medically stable.  Urinary retention -Foley catheter placed in the ED -CT shows tip of the catheter is in the prostatic urethra -Requested nursing to advance/adjust foley -Continue monitoring urine output -Patient denying dysuria and suprapubic tenderness at this moment.  Acute metabolic encephalopathy -Patient mentation slowly improving. -Ammonia 30 -CT brain negative -UA negative for pyuria -Positive bacteremia as mentioned below -Continue current antibiotics.  Lactic acidosis/enterococcal and Acinetobacter bacteremia -Suspect this is due to decreased clearance from his hepatic cirrhosis, versus associated with active infection. -Continue current antibiotics. -Following ID recommendations 2D echo repeat blood culture has been ordered.  Hypokalemia -Repleted and is stable -Magnesium within normal limits -Continue to follow ultralights trend.  Liver cirrhosis secondary to nonalcoholic steatohepatitis (NASH)  -Pt was seen by transplant hepatology at [facility] on 11-05-2022. He has outpatient labs that showed + ANA and + anti-smooth muscle antibody.  -This calls into question whether his cirrhosis is caused by autoimmune hepatitis. -Continue patient follow-up with GI service -LFTs appears to be stable. -Aldactone and Demadex will be called in the setting of ongoing low blood pressure currently.  Crohn's disease of both small and large intestine with complication ([place]) -Pt with ileostomy.  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  Bipolar -continue depakote, risperdal, sertraline, benztropine -Overall mood is stable currently.  Mixed Hyperlipidemia -continue statin -Heart healthy diet discussed with patient.  History DVTs/PE -Given concerns for blood in his ostomy bag we will hold Eliquis. -SCDs while in bed will be  provided.  Pancytopenia -chronic -Continue patient follow-up with hematology service. -  Continue close monitoring of patient's hemoglobin and platelet count.  Class 2 Obese -BMI 37.04 -Low-calorie diet and portion control discussed with patient.    Family Communication:   None family at bedside.  Consultants: Infectious disease.  Code Status:  FULL   DVT Prophylaxis:  SCDs   Procedures: As Listed in Progress Note Above  Antibiotics: Meropenem  Subjective: No fever, no chest pain, no nausea or vomiting.  Able to follow simple commands without problems.  Soft blood pressure along with dark-colored feces in ostomy bag.   Objective: Vitals:   11/17/22 0424 11/17/22 0500 11/17/22 0827 11/17/22 1649  BP: (!) 114/56  (!) 94/40 105/61  Pulse: 90  96 89  Resp: 15  18 18  $ Temp: 98.2 F (36.8 C)  98.2 F (36.8 C) 97.9 F (36.6 C)  TempSrc:   Oral Oral  SpO2: 96%  95% 99%  Weight:  104.2 kg    Height:        Intake/Output Summary (Last 24 hours) at 11/17/2022 1654 Last data filed at 11/17/2022 0500 Gross per 24 hour  Intake 660 ml  Output 900 ml  Net -240 ml   Weight change: 3.955 kg  Exam: General exam: Alert, awake and following commands appropriately; denies chest pain, abdominal pain, nausea and vomiting.  Dark color stool material appreciated in patient colostomy bag.  Currently afebrile. Respiratory system: Good air movement bilaterally; no wheezing or crackles appreciated on exam. Cardiovascular system:RRR. No rubs or gallops; unable to properly assess JVD with body habitus. Gastrointestinal system: Abdomen is obese, with positive bowel sounds; right mid-lower quadrant ostomy in place.  Nondistended, soft and nontender.  Central nervous system:No focal neurological deficits. Extremities: No cyanosis or clubbing; 2+ edema appreciated bilaterally. Skin: No petechiae.  Positive ostomy in place with some dark stools. Psychiatry: Mood and affect  appropriate.   Data Reviewed: I have personally reviewed following labs and imaging studies Basic Metabolic Panel: Recent Labs  Lab 11/15/22 1223 11/15/22 1254 11/16/22 0417 11/17/22 0412  NA 132* 136 134* 133*  K 2.6* 2.8* 4.4 4.8  CL 99 97* 105 106  CO2 25  --  23 24  GLUCOSE 95 91 67* 77  BUN 11 10 10 14  $ CREATININE 1.15 1.20 0.95 1.02  CALCIUM 8.3*  --  7.9* 7.7*  MG 1.8  --  1.8 2.0   Liver Function Tests: Recent Labs  Lab 11/15/22 1223 11/16/22 0417  AST 44* 43*  ALT 19 18  ALKPHOS 98 87  BILITOT 1.1 1.4*  PROT 5.5* 5.0*  ALBUMIN 1.9* 1.8*    Recent Labs  Lab 11/15/22 1905 11/17/22 0412  AMMONIA 30 66*   CBC: Recent Labs  Lab 11/15/22 1223 11/15/22 1254 11/16/22 0417 11/17/22 0412  WBC 2.3*  --  3.6* 3.8*  NEUTROABS 1.2*  --  2.4  --   HGB 8.7* 9.2* 7.8* 7.2*  HCT 27.0* 27.0* 24.6* 22.9*  MCV 102.7*  --  102.5* 104.6*  PLT 46*  --  46* 47*   Cardiac Enzymes: Recent Labs  Lab 11/16/22 1201  CKTOTAL 215   CBG: Recent Labs  Lab 11/16/22 1631 11/16/22 2221 11/17/22 0955 11/17/22 1138 11/17/22 1631  GLUCAP 101* 90 115* 105* 97   Urine analysis:    Component Value Date/Time   COLORURINE YELLOW 11/15/2022 1327   APPEARANCEUR CLEAR 11/15/2022 1327   APPEARANCEUR Cloudy (A) 08/25/2022 1417   LABSPEC 1.012 11/15/2022 1327   PHURINE 6.0 11/15/2022 1327   GLUCOSEU NEGATIVE 11/15/2022  [name] 11/15/2022 [place] 11/15/2022 1327   BILIRUBINUR Negative 08/25/2022 1417   KETONESUR NEGATIVE 11/15/2022 1327   PROTEINUR NEGATIVE 11/15/2022 1327   UROBILINOGEN 0.2 10/17/2014 1940   NITRITE NEGATIVE 11/15/2022 1327   LEUKOCYTESUR NEGATIVE 11/15/2022 1327   Sepsis Labs:  Recent Results (from the past 240 hour(s))  Blood Culture (routine x 2)     Status: Abnormal (Preliminary result)   Collection Time: 11/15/22 12:23 PM   Specimen: BLOOD RIGHT HAND  Result Value Ref Range Status   Specimen Description    Final    BLOOD RIGHT HAND Performed at [facility], [address], [place], [place] [address]    Special Requests   Final    BOTTLES DRAWN AEROBIC AND ANAEROBIC Blood Culture adequate volume Performed at [facility], [address], [place], [place] [address]    Culture  Setup Time   Final    GRAM POSITIVE COCCI BOTTLES DRAWN AEROBIC AND ANAEROBIC Gram Stain Report Called to,Read Back By and Verified With: DILDY,[email or web address]$  BY MATTHEWS B 2.13.2024 CRITICAL RESULT CALLED TO, READ BACK BY AND VERIFIED WITH: PHARMD FRANK WILSON  ON 11/16/22 @ 1444 BY DRT    Culture (A)  Final    ACINETOBACTER BAUMANNII ENTEROCOCCUS FAECALIS SUSCEPTIBILITIES TO FOLLOW Performed at [facility], [place] [address], [place], [place] [address]    Report Status PENDING  Incomplete  Blood Culture ID Panel (Reflexed)     Status: Abnormal   Collection Time: 11/15/22 12:23 PM  Result Value Ref Range Status   Enterococcus faecalis NOT DETECTED NOT DETECTED Final   Enterococcus Faecium NOT DETECTED NOT DETECTED Final   Listeria monocytogenes NOT DETECTED NOT DETECTED Final   Staphylococcus species NOT DETECTED NOT DETECTED Final   Staphylococcus aureus (BCID) NOT DETECTED NOT DETECTED Final   Staphylococcus epidermidis NOT DETECTED NOT DETECTED Final   Staphylococcus lugdunensis NOT DETECTED NOT DETECTED Final   Streptococcus species NOT DETECTED NOT DETECTED Final   Streptococcus agalactiae NOT DETECTED NOT DETECTED Final   Streptococcus pneumoniae NOT DETECTED NOT DETECTED Final   Streptococcus pyogenes NOT DETECTED NOT DETECTED Final   A.calcoaceticus-baumannii DETECTED (A) NOT DETECTED Final    Comment: CRITICAL RESULT CALLED TO, READ BACK BY AND VERIFIED WITH: PHARMD FRANK WILSON  ON 11/16/22 @ 1444 BY DRT    Bacteroides fragilis NOT DETECTED NOT DETECTED Final   Enterobacterales NOT DETECTED NOT DETECTED Final   Enterobacter cloacae complex NOT DETECTED NOT DETECTED Final   Escherichia coli NOT DETECTED  NOT DETECTED Final   Klebsiella aerogenes NOT DETECTED NOT DETECTED Final   Klebsiella oxytoca NOT DETECTED NOT DETECTED Final   Klebsiella pneumoniae NOT DETECTED NOT DETECTED Final   Proteus species NOT DETECTED NOT DETECTED Final   Salmonella species NOT DETECTED NOT DETECTED Final   Serratia marcescens NOT DETECTED NOT DETECTED Final   Haemophilus influenzae NOT DETECTED NOT DETECTED Final   Neisseria meningitidis NOT DETECTED NOT DETECTED Final   Pseudomonas aeruginosa NOT DETECTED NOT DETECTED Final   Stenotrophomonas maltophilia NOT DETECTED NOT DETECTED Final   Candida albicans NOT DETECTED NOT DETECTED Final   Candida auris NOT DETECTED NOT DETECTED Final   Candida glabrata NOT DETECTED NOT DETECTED Final   Candida krusei NOT DETECTED NOT DETECTED Final   Candida parapsilosis NOT DETECTED NOT DETECTED Final   Candida tropicalis NOT DETECTED NOT DETECTED Final   Cryptococcus neoformans/gattii NOT DETECTED NOT DETECTED Final   CTX-M ESBL NOT DETECTED NOT DETECTED  Final   Carbapenem resistance IMP NOT DETECTED NOT DETECTED Final   Carbapenem resistance KPC NOT DETECTED NOT DETECTED Final   Carbapenem resistance NDM NOT DETECTED NOT DETECTED Final   Carbapenem resistance VIM NOT DETECTED NOT DETECTED Final    Comment: Performed at [facility], [place] [address], [place], [place] [address]  Blood Culture (routine x 2)     Status: None (Preliminary result)   Collection Time: 11/15/22 12:28 PM   Specimen: BLOOD LEFT HAND  Result Value Ref Range Status   Specimen Description BLOOD LEFT HAND  Final   Special Requests   Final    BOTTLES DRAWN AEROBIC ONLY Blood Culture results may not be optimal due to an inadequate volume of blood received in culture bottles   Culture   Final    NO GROWTH 2 DAYS Performed at [facility], [address], [place], [place] [address]    Report Status PENDING  Incomplete  Resp panel by RT-PCR (RSV, Flu A&B, Covid) Anterior Nasal Swab     Status:  None   Collection Time: 11/15/22  1:28 PM   Specimen: Anterior Nasal Swab  Result Value Ref Range Status   SARS Coronavirus 2 by RT PCR NEGATIVE NEGATIVE Final    Comment: (NOTE) SARS-CoV-2 target nucleic acids are NOT DETECTED.  The SARS-CoV-2 RNA is generally detectable in upper respiratory specimens during the acute phase of infection. The lowest concentration of SARS-CoV-2 viral copies this assay can detect is 138 copies/mL. A negative result does not preclude SARS-Cov-2 infection and should not be used as the sole basis for treatment or other patient management decisions. A negative result may occur with  improper specimen collection/handling, submission of specimen other than nasopharyngeal swab, presence of viral mutation(s) within the areas targeted by this assay, and inadequate number of viral copies(<138 copies/mL). A negative result must be combined with clinical observations, patient history, and epidemiological information. The expected result is Negative.  Fact Sheet for Patients:  EntrepreneurPulse.com.au  Fact Sheet for Healthcare Providers:  IncredibleEmployment.be  This test is no t yet approved or cleared by the [place] FDA and  has been authorized for detection and/or diagnosis of SARS-CoV-2 by FDA under an Emergency Use Authorization (EUA). This EUA will remain  in effect (meaning this test can be used) for the duration of the COVID-19 declaration under Section 564(b)(1) of the Act, 21 U.S.C.section 360bbb-3(b)(1), unless the authorization is terminated  or revoked sooner.       Influenza A by PCR NEGATIVE NEGATIVE Final   Influenza B by PCR NEGATIVE NEGATIVE Final    Comment: (NOTE) The Xpert Xpress SARS-CoV-2/FLU/RSV plus assay is intended as an aid in the diagnosis of influenza from Nasopharyngeal swab specimens and should not be used as a sole basis for treatment. Nasal washings and aspirates are unacceptable  for Xpert Xpress SARS-CoV-2/FLU/RSV testing.  Fact Sheet for Patients: EntrepreneurPulse.com.au  Fact Sheet for Healthcare Providers: IncredibleEmployment.be  This test is not yet approved or cleared by the [place] FDA and has been authorized for detection and/or diagnosis of SARS-CoV-2 by FDA under an Emergency Use Authorization (EUA). This EUA will remain in effect (meaning this test can be used) for the duration of the COVID-19 declaration under Section 564(b)(1) of the Act, 21 U.S.C. section 360bbb-3(b)(1), unless the authorization is terminated or revoked.     Resp Syncytial Virus by PCR NEGATIVE NEGATIVE Final    Comment: (NOTE) Fact Sheet for Patients: EntrepreneurPulse.com.au  Fact Sheet for Healthcare  Providers: IncredibleEmployment.be  This test is not yet approved or cleared by the [place] and has been authorized for detection and/or diagnosis of SARS-CoV-2 by FDA under an Emergency Use Authorization (EUA). This EUA will remain in effect (meaning this test can be used) for the duration of the COVID-19 declaration under Section 564(b)(1) of the Act, 21 U.S.C. section 360bbb-3(b)(1), unless the authorization is terminated or revoked.  Performed at [facility], [address] [place], [place] [address]      Scheduled Meds:  atorvastatin  80 mg Oral Daily   benztropine  0.5 mg Oral Daily   divalproex  1,000 mg Oral QHS   divalproex  250 mg Oral Daily   hydroxychloroquine  200 mg Oral BID   insulin aspart  0-5 Units Subcutaneous QHS   insulin aspart  0-9 Units Subcutaneous TID WC   metoprolol succinate  25 mg Oral Daily   pantoprazole  40 mg Oral BID   risperiDONE  1 mg Oral QHS   risperiDONE  6 mg Oral QHS   sertraline  50 mg Oral Daily   traZODone  100 mg Oral QHS   Continuous Infusions:  meropenem (MERREM) IV 1 g (11/17/22 1518)    Procedures/Studies: CT CHEST  ABDOMEN PELVIS W CONTRAST  Result Date: 11/15/2022 CLINICAL DATA:  Sepsis, polytrauma, fall EXAM: CT CHEST, ABDOMEN, AND PELVIS WITH CONTRAST TECHNIQUE: Multidetector CT imaging of the chest, abdomen and pelvis was performed following the standard protocol during bolus administration of intravenous contrast. RADIATION DOSE REDUCTION: This exam was performed according to the departmental dose-optimization program which includes automated exposure control, adjustment of the mA and/or kV according to patient size and/or use of iterative reconstruction technique. CONTRAST:  138m OMNIPAQUE IOHEXOL 300 MG/ML  SOLN COMPARISON:  CT abdomen pelvis, 09/06/2022 CT chest, 04/15/2022 FINDINGS: CT CHEST FINDINGS Cardiovascular: Right chest port catheter. Aortic atherosclerosis. Normal heart size. Left coronary artery calcifications. No pericardial effusion. Mediastinum/Nodes: No enlarged mediastinal, hilar, or axillary lymph nodes. Small incidental diverticulum of the posterior right trachea at the level of the thoracic inlet (series 2, image 8). Thyroid gland and esophagus demonstrate no significant findings. Lungs/Pleura: Moderate right pleural effusion and associated atelectasis or consolidation. Musculoskeletal: No chest wall abnormality. No acute osseous findings. Status post bilateral shoulder arthroplasty. CT ABDOMEN PELVIS FINDINGS Hepatobiliary: Coarse contour of the liver. No gallstones, gallbladder wall thickening, or biliary dilatation. Pancreas: Unremarkable. No pancreatic ductal dilatation or surrounding inflammatory changes. Spleen: Normal in size without significant abnormality. Adrenals/Urinary Tract: Adrenal glands are unremarkable. Kidneys are normal, without renal calculi, solid lesion, or hydronephrosis. Malpositioned Foley catheter, tip and balloon within the prostatic or bulbous urethra (series 5, image 111) Stomach/Bowel: Stomach is within normal limits. Status post total colectomy with right lower  quadrant end ileostomy. Vascular/Lymphatic: Aortic atherosclerosis. 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 No enlarged abdominal or pelvic lymph nodes. Reproductive: No mass or other abnormality. Other: No abdominal wall hernia or abnormality. Small volume perihepatic ascites. Musculoskeletal: No acute osseous findings. 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 IMPRESSION: 1. No CT evidence of acute traumatic injury to the chest, abdomen, or pelvis. 2. Moderate right pleural effusion and associated atelectasis or consolidation. 3. Malpositioned Foley catheter, tip and balloon within the prostatic or bulbous urethra. Recommend repositioning. 4. 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 5. Small volume perihepatic ascites. 6. Coarse contour of the liver and gastric varices, suggestive of cirrhosis. 7. Coronary artery disease. Aortic Atherosclerosis (  ICD10-I70.0). Electronically Signed   By: Delanna Ahmadi M.D.   On: 11/15/2022 14:32   CT Head Wo Contrast  Result Date: 11/15/2022 CLINICAL DATA:  Three falls in the past 24 hours. EXAM: CT HEAD WITHOUT CONTRAST CT CERVICAL SPINE WITHOUT CONTRAST TECHNIQUE: Multidetector CT imaging of the head and cervical spine was performed following the standard protocol without intravenous contrast. Multiplanar CT image reconstructions of the cervical spine were also generated. RADIATION DOSE REDUCTION: This exam was performed according to the departmental dose-optimization program which includes automated exposure control, adjustment of the mA and/or kV according to patient size and/or use of iterative reconstruction technique. COMPARISON:  CT head 05/16/2021 FINDINGS: CT HEAD FINDINGS Brain: There is no acute intracranial hemorrhage, extra-axial fluid collection, or acute infarct. Parenchymal volume is normal. The ventricles are normal in size. Gray-white differentiation is preserved. The pituitary and suprasellar  region are normal. There is no mass lesion. There is no mass effect or midline shift. Vascular: No hyperdense vessel or unexpected calcification. Skull: Normal. Negative for fracture or focal lesion. Sinuses/Orbits: There is mild mucosal thickening in the paranasal sinuses. The globes and orbits are unremarkable. Other: None. CT CERVICAL SPINE FINDINGS Alignment: Normal. There is no jumped or perched facet or other evidence of traumatic malalignment. Skull base and vertebrae: Skull base alignment is maintained. Vertebral body heights are preserved. There is no evidence of acute fracture. There is no suspicious osseous lesion. Soft tissues and spinal canal: No prevertebral fluid or swelling. No visible canal hematoma. Disc levels: Facet arthropathy is most advanced on the right at C3-C4. There is overall mild disc space narrowing at C4-C5 and C5-C6. There is no convincing high-grade spinal canal stenosis. Upper chest: Assessed on the separately dictated CT chest. Other: None. IMPRESSION: 1. No acute intracranial pathology. 2. No acute fracture or traumatic malalignment of the cervical spine. Electronically Signed   By: Valetta Mole M.D.   On: 11/15/2022 14:24   CT Cervical Spine Wo Contrast  Result Date: 11/15/2022 CLINICAL DATA:  Three falls in the past 24 hours. EXAM: CT HEAD WITHOUT CONTRAST CT CERVICAL SPINE WITHOUT CONTRAST TECHNIQUE: Multidetector CT imaging of the head and cervical spine was performed following the standard protocol without intravenous contrast. Multiplanar CT image reconstructions of the cervical spine were also generated. RADIATION DOSE REDUCTION: This exam was performed according to the departmental dose-optimization program which includes automated exposure control, adjustment of the mA and/or kV according to patient size and/or use of iterative reconstruction technique. COMPARISON:  CT head 05/16/2021 FINDINGS: CT HEAD FINDINGS Brain: There is no acute intracranial hemorrhage,  extra-axial fluid collection, or acute infarct. Parenchymal volume is normal. The ventricles are normal in size. Gray-white differentiation is preserved. The pituitary and suprasellar region are normal. There is no mass lesion. There is no mass effect or midline shift. Vascular: No hyperdense vessel or unexpected calcification. Skull: Normal. Negative for fracture or focal lesion. Sinuses/Orbits: There is mild mucosal thickening in the paranasal sinuses. The globes and orbits are unremarkable. Other: None. CT CERVICAL SPINE FINDINGS Alignment: Normal. There is no jumped or perched facet or other evidence of traumatic malalignment. Skull base and vertebrae: Skull base alignment is maintained. Vertebral body heights are preserved. There is no evidence of acute fracture. There is no suspicious osseous lesion. Soft tissues and spinal canal: No prevertebral fluid or swelling. No visible canal hematoma. Disc levels: Facet arthropathy is most advanced on the right at C3-C4. There is overall mild disc space narrowing at C4-C5 and C5-C6.  There is no convincing high-grade spinal canal stenosis. Upper chest: Assessed on the separately dictated CT chest. Other: None. IMPRESSION: 1. No acute intracranial pathology. 2. No acute fracture or traumatic malalignment of the cervical spine. Electronically Signed   By: Valetta Mole M.D.   On: 11/15/2022 14:24   DG Chest Port 1 View  Result Date: 11/15/2022 CLINICAL DATA:  Sepsis, possible UTI EXAM: PORTABLE CHEST 1 VIEW COMPARISON:  CT chest dated 03/16/2022 FINDINGS: Lungs are clear.  No pleural effusion or pneumothorax. The heart is normal in size. Right chest power port terminates at the cavoatrial junction. Bilateral shoulder arthroplasty. IMPRESSION: No evidence of acute cardiopulmonary disease. Electronically Signed   By: Julian Hy M.D.   On: 11/15/2022 13:35    Barton Dubois, MD  [facility]  If 7PM-7AM, please contact  night-coverage [email or web address] Password [facility] 11/17/2022, 4:54 PM   LOS: 1 day

## 2022-11-17 NOTE — Progress Notes (Signed)
Ostomy care provided, the old pouch was removed,and replace with a new pouch.Skin was cleanse to peristomal  skin. Stoma pink in color.Patient tolerated procedure well. Plan of care on going.

## 2022-11-17 NOTE — Progress Notes (Signed)
Patients urine presenting with blood and blood clots, Drs already aware. No distress noted.

## 2022-11-17 NOTE — Consult Note (Signed)
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  Stomal assessment/ pink budded stoma visualized in image  Peristomal assessment: NA; no diffuse skin irritation noted at tape borders Treatment options for stomal/peristomal skin: use 2" skin barrier ring with next pouch change around stoma to aid in seal Output liquid green, 850 documented in last 24 hours  Ostomy pouching: 2pc. 2 3/4" in place currently, reported to be leaking on night shift, documented to be intact now  Education provided: NA, patient and/or CG independent in care, ostomy greater than 10 years  Patient not admitted for ostomy issues  For Bedside nursing: Ostomy care:  1. Remove old pouch, making sure to cleanse peristomal skin well and dry  2. Cut to fit opening of skin barrier to the size of the stoma, not too big, ileostomy output will erode skin quickly  3. Place 2" skin barrier ring directly around stoma, stretch to fit  4. Place skin barrier over barrier ring  5. Snap 2 3/4" pouch or 2 3/4" high output pouch in place  6. If using routine drainable pouch, check frequently for filling.  Appears patient is putting out > 750 in last shift  7. If using high output pouch (one with spout) hook to bedside drainage bag  8. Change pouch at least 2x wk and PRN leakage.   WOC contacted materials management to order the following Kellie Simmering #2 (4) each  Kellie Simmering # [phone] (2) each Kellie Simmering # [phone] (4) each Kellie Simmering # [phone] (2) Bedside drainage bag can be obtained from floor stock  This will give bedside nursing an option based on ability to empty frequently to use either pouch, they are interchangable on the 2 3/4" skin barrier.   Discussed POC with bedside nurse and hospitalist  Re consult if needed, will not follow at this time. Thanks  Karessa Onorato [employer], RN,CWOCN, CNS, [place] [phone])

## 2022-11-18 ENCOUNTER — Ambulatory Visit: Payer: Medicaid Other

## 2022-11-18 ENCOUNTER — Inpatient Hospital Stay (HOSPITAL_COMMUNITY): Payer: Medicaid Other

## 2022-11-18 ENCOUNTER — Other Ambulatory Visit (HOSPITAL_COMMUNITY): Payer: Self-pay | Admitting: *Deleted

## 2022-11-18 DIAGNOSIS — R7881 Bacteremia: Secondary | ICD-10-CM

## 2022-11-18 DIAGNOSIS — J90 Pleural effusion, not elsewhere classified: Secondary | ICD-10-CM | POA: Diagnosis not present

## 2022-11-18 DIAGNOSIS — Z86718 Personal history of other venous thrombosis and embolism: Secondary | ICD-10-CM | POA: Diagnosis not present

## 2022-11-18 DIAGNOSIS — E876 Hypokalemia: Secondary | ICD-10-CM | POA: Diagnosis not present

## 2022-11-18 DIAGNOSIS — K50819 Crohn's disease of both small and large intestine with unspecified complications: Secondary | ICD-10-CM | POA: Diagnosis not present

## 2022-11-18 LAB — BASIC METABOLIC PANEL
Anion gap: 3 — ABNORMAL LOW (ref 5–15)
BUN: 16 mg/dL (ref 6–20)
CO2: 24 mmol/L (ref 22–32)
Calcium: 7.9 mg/dL — ABNORMAL LOW (ref 8.9–10.3)
Chloride: 104 mmol/L (ref 98–111)
Creatinine, Ser: 0.93 mg/dL (ref 0.61–1.24)
GFR, Estimated: >60 mL/min (ref >60)
Glucose, Bld: 87 mg/dL (ref 70–99)
Potassium: 4.5 mmol/L (ref 3.5–5.1)
Sodium: 131 mmol/L — ABNORMAL LOW (ref 135–145)

## 2022-11-18 LAB — CBC
HCT: 22.7 % — ABNORMAL LOW (ref 39.0–52.0)
Hemoglobin: 7.4 g/dL — ABNORMAL LOW (ref 13.0–17.0)
MCH: 33.8 pg (ref 26.0–34.0)
MCHC: 32.6 g/dL (ref 30.0–36.0)
MCV: 103.7 fL — ABNORMAL HIGH (ref 80.0–100.0)
Platelets: 59 10*3/uL — ABNORMAL LOW (ref 150–400)
RBC: 2.19 MIL/uL — ABNORMAL LOW (ref 4.22–5.81)
RDW: 18.5 % — ABNORMAL HIGH (ref 11.5–15.5)
WBC: 4.4 10*3/uL (ref 4.0–10.5)
nRBC: 0 % (ref 0.0–0.2)

## 2022-11-18 LAB — CULTURE, BLOOD (ROUTINE X 2): Special Requests: ADEQUATE

## 2022-11-18 LAB — GLUCOSE, CAPILLARY
Glucose-Capillary: 86 mg/dL (ref 70–99)
Glucose-Capillary: 119 mg/dL — ABNORMAL HIGH (ref 70–99)
Glucose-Capillary: 167 mg/dL — ABNORMAL HIGH (ref 70–99)
Glucose-Capillary: 109 mg/dL — ABNORMAL HIGH (ref 70–99)

## 2022-11-18 LAB — ECHOCARDIOGRAM COMPLETE
Area-P 1/2: 3.93 cm2
Height: 66 in
S' Lateral: 2.8 cm
Weight: 3654.34 oz

## 2022-11-18 MED ORDER — CIPROFLOXACIN HCL 250 MG PO TABS
500.0000 mg | ORAL_TABLET | Freq: Two times a day (BID) | ORAL | Status: DC
  Administered 2022-11-18 – 2022-11-29 (×21): 500 mg via ORAL
  Filled 2022-11-18 (×22): qty 2

## 2022-11-18 MED ORDER — LINEZOLID 600 MG PO TABS
600.0000 mg | ORAL_TABLET | Freq: Two times a day (BID) | ORAL | Status: DC
  Administered 2022-11-18 – 2022-11-28 (×19): 600 mg via ORAL
  Filled 2022-11-18 (×25): qty 1

## 2022-11-18 NOTE — Progress Notes (Signed)
        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

## 2022-11-18 NOTE — Progress Notes (Signed)
PROGRESS NOTE  [name] [id number] DOB: [birth year] DOA: 11/15/2022 PCP: [facility]  Brief History:  57 year old male with a history of diabetes mellitus type 2, pancytopenia, recurrent DVT/PE on Eliquis, RA, Crohn's colitis status post colectomy with ileostomy 2009 presenting with multiple falls.  The patient is a poor historian. Patient was seen on 05 November 2022 by liver transplant at [facility].  He was noted to have nonalcoholic fatty liver disease along with cirrhosis.  Autoimmune workup was started.  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  Has not had follow-up yet.   Patient been taking Demadex 20 mg a day prescribed by his cardiologist due to lower extremity edema.  Patient has not been started on Aldactone yet. This was mentioned during his hepatology consult. Patient states that he is taking Aldactone but there is no recording of this in his chart.   The patient was recently hospitalized from 09/06/2022 to 09/09/2022 for GI bleed.  EGD  09/07/22 with 1cm hh, Erythematous mucosa in the gastric fundus. Treated w/ (APC).GAVE w/ bleeding. Treated w/ (APC). Erythematous duodenopathy.  The patient was discharged home with pantoprazole twice daily.  In the ED, the patient was noted to be tachycardic in the 110s.  He was hypotensive with systolic blood pressure 83.  The patient was given 3.5 L of fluid.  Lactic acid was 3.7.  He was admitted for further evaluation and treatment of SIRS and altered mentation    Assessment/Plan: Frequent falls/failure to thrive -TSH, B12, folate and ammonia within normal limits -Continue supportive care -Follow clinical response.  Generalized weakness -Multifactorial including deconditioning, hypokalemia, possible infectious process. -Continue current IV antibiotics -Follow physical therapy evaluation/recommendations. -Currently with recommendation for a skilled nursing facility at discharge once  medically stable.  Urinary retention -Foley catheter placed in the ED -CT shows tip of the catheter is in the prostatic urethra -Requested nursing to advance/adjust foley -Continue monitoring urine output -Patient denying dysuria and suprapubic tenderness at this moment. -Patient will benefit of outpatient follow-up with urology service.  Acute metabolic encephalopathy -Patient mentation slowly improving. -Ammonia 30 -CT brain negative -UA negative for pyuria -Positive bacteremia as mentioned below -Continue current antibiotics.  Lactic acidosis/enterococcal and Acinetobacter bacteremia -Suspect this is due to decreased clearance from his hepatic cirrhosis, versus associated with active infection. -Following ID recommendations 2D echo performed; no vegetations appreciated.  Holding on TEE at this moment -Based on cultures sensitivity antibiotics will be narrow to Cipro and Zyvox with plans for 2 weeks of therapy.  Hypokalemia -Repleted and is stable -Magnesium within normal limits -Continue to follow electrolytes trend.  Mild hyponatremia -Most likely associated with chronic cirrhosis and dehydration -Improved and pretty much back to baseline after fluid resuscitation provided -Continue to follow electrolytes trend.  Liver cirrhosis secondary to nonalcoholic steatohepatitis (NASH)  -Pt was seen by transplant hepatology at [facility] on 11-05-2022. He has outpatient labs that showed + ANA and + anti-smooth muscle antibody.  -This calls into question whether his cirrhosis is caused by autoimmune hepatitis. -Continue outpatient follow-up with GI service -LFTs appears to be stable. -Continue holding Aldactone and Demadex.  Crohn's disease of both small and large intestine with complication ([place]) -Pt with ileostomy.  -Pt denies any high output from ileostomy -Continue to maintain adequate hydration.  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  Bipolar -  continue depakote, risperdal, sertraline, benztropine -Overall mood is stable currently.  Mixed Hyperlipidemia -continue statin -Heart healthy diet discussed with patient.  History DVTs/PE -Given concerns for blood in his ostomy bag we will hold Eliquis. -SCDs while in bed ordered.  Pancytopenia -chronic. -Continue patient follow-up with hematology service. -Continue close monitoring of patient's hemoglobin and platelet count. -Hemoglobin has remained stable.  Complaining of delusional effect following fluid resuscitation most likely playing a role in decreased hemoglobin.  Class 2 Obese -BMI 37.04 -Low-calorie diet and portion control discussed with patient.    Family Communication:   None family at bedside.  Consultants: Infectious disease.  Code Status:  FULL   DVT Prophylaxis:  SCDs   Procedures: As Listed in Progress Note Above  Antibiotics: Meropenem  Subjective: No fever, no chest pain, no nausea, no vomiting.  Following commands appropriately.  Slow to wake up this morning but completely asymptomatic and normal acting as the day progresses.   Objective: Vitals:   11/18/22 0457 11/18/22 0821 11/18/22 0900 11/18/22 1054  BP: 118/62 107/61 (!) 101/58 (!) 105/56  Pulse: 84 83 91 87  Resp: 16 15 16   $ Temp: 98.1 F (36.7 C) 98.6 F (37 C) 98.7 F (37.1 C)   TempSrc: Oral Oral Oral   SpO2: 95% 96% 96%   Weight: 103.6 kg     Height:        Intake/Output Summary (Last 24 hours) at 11/18/2022 1610 Last data filed at 11/18/2022 1100 Gross per 24 hour  Intake 360 ml  Output 1000 ml  Net -640 ml   Weight change: -0.6 kg  Exam: General exam: Slow to awake this morning; but then oriented x 3, following commands and demonstrating acute distress.  Patient is afebrile.  No signs of overt bleeding or dark-colored fecal material in his ostomy bag at this moment. Respiratory system: Clear to  auscultation. Respiratory effort normal.  Good saturation on room air; no using accessory muscles. Cardiovascular system:RRR. No rubs or gallops; no JVD. Gastrointestinal system: Abdomen is obese, nondistended, with positive bowel sounds and reporting no tenderness on palpation.  Ostomy appreciated with greenish fecal material and no signs of blood. Central nervous system: Alert and oriented. No focal neurological deficits. Extremities: No cyanosis or clubbing; 2+ edema appreciated bilaterally. Skin: No pitting. Psychiatry: Appropriate mood and affect.  No suicidal ideation or hallucinations.   Data Reviewed: I have personally reviewed following labs and imaging studies  Basic Metabolic Panel: Recent Labs  Lab 11/15/22 1223 11/15/22 1254 11/16/22 0417 11/17/22 0412 11/18/22 0408  NA 132* 136 134* 133* 131*  K 2.6* 2.8* 4.4 4.8 4.5  CL 99 97* 105 106 104  CO2 25  --  23 24 24  $ GLUCOSE 95 91 67* 77 87  BUN 11 10 10 14 16  $ CREATININE 1.15 1.20 0.95 1.02 0.93  CALCIUM 8.3*  --  7.9* 7.7* 7.9*  MG 1.8  --  1.8 2.0  --    Liver Function Tests: Recent Labs  Lab 11/15/22 1223 11/16/22 0417  AST 44* 43*  ALT 19 18  ALKPHOS 98 87  BILITOT 1.1 1.4*  PROT 5.5* 5.0*  ALBUMIN 1.9* 1.8*    Recent Labs  Lab 11/15/22 1905 11/17/22 0412  AMMONIA 30 66*   CBC: Recent Labs  Lab 11/15/22 1223 11/15/22 1254 11/16/22 0417 11/17/22 0412 11/18/22 0408  WBC 2.3*  --  3.6* 3.8* 4.4  NEUTROABS 1.2*  --  2.4  --   --   HGB  8.7* 9.2* 7.8* 7.2* 7.4*  HCT 27.0* 27.0* 24.6* 22.9* 22.7*  MCV 102.7*  --  102.5* 104.6* 103.7*  PLT 46*  --  46* 47* 59*   Cardiac Enzymes: Recent Labs  Lab 11/16/22 1201  CKTOTAL 215   CBG: Recent Labs  Lab 11/17/22 1138 11/17/22 1631 11/17/22 2112 11/18/22 0733 11/18/22 1109  GLUCAP 105* 97 128* 86 119*   Urine analysis:    Component Value Date/Time   COLORURINE YELLOW 11/15/2022 1327   APPEARANCEUR CLEAR 11/15/2022 1327   APPEARANCEUR  Cloudy (A) 08/25/2022 1417   LABSPEC 1.012 11/15/2022 1327   PHURINE 6.0 11/15/2022 1327   GLUCOSEU NEGATIVE 11/15/2022 1327   HGBUR NEGATIVE 11/15/2022 1327   BILIRUBINUR NEGATIVE 11/15/2022 1327   BILIRUBINUR Negative 08/25/2022 1417   KETONESUR NEGATIVE 11/15/2022 1327   PROTEINUR NEGATIVE 11/15/2022 1327   UROBILINOGEN 0.2 10/17/2014 1940   NITRITE NEGATIVE 11/15/2022 1327   LEUKOCYTESUR NEGATIVE 11/15/2022 1327   Sepsis Labs:  Recent Results (from the past 240 hour(s))  Blood Culture (routine x 2)     Status: Abnormal   Collection Time: 11/15/22 12:23 PM   Specimen: BLOOD RIGHT HAND  Result Value Ref Range Status   Specimen Description   Final    BLOOD RIGHT HAND Performed at [facility], [address] [place], [place] [address]    Special Requests   Final    BOTTLES DRAWN AEROBIC AND ANAEROBIC Blood Culture adequate volume Performed at [facility], [address], [place], [place] [address]    Culture  Setup Time   Final    GRAM POSITIVE COCCI BOTTLES DRAWN AEROBIC AND ANAEROBIC Gram Stain Report Called to,Read Back By and Verified With: [email or web address],[email or web address]$  BY MATTHEWS B 2.13.2024 CRITICAL RESULT CALLED TO, READ BACK BY AND VERIFIED WITH: [place]  ON 11/16/22 @ 1444 BY DRT Performed at [facility], [place] [address], [place], [place] [address]    Culture (A)  Final    ACINETOBACTER CALCOACETICUS/BAUMANNII COMPLEX ENTEROCOCCUS FAECALIS    Report Status 11/18/2022 FINAL  Final   Organism ID, Bacteria ACINETOBACTER CALCOACETICUS/BAUMANNII COMPLEX  Final   Organism ID, Bacteria ENTEROCOCCUS FAECALIS  Final      Susceptibility   Acinetobacter calcoaceticus/baumannii complex - MIC*    CEFTAZIDIME 8 SENSITIVE Sensitive     CIPROFLOXACIN 1 SENSITIVE Sensitive     GENTAMICIN 8 INTERMEDIATE Intermediate     IMIPENEM <=0.25 SENSITIVE Sensitive     PIP/TAZO <=4 SENSITIVE Sensitive     TRIMETH/SULFA <=20 SENSITIVE Sensitive     AMPICILLIN/SULBACTAM <=2 SENSITIVE  Sensitive     * ACINETOBACTER CALCOACETICUS/BAUMANNII COMPLEX   Enterococcus faecalis - MIC*    AMPICILLIN <=2 SENSITIVE Sensitive     VANCOMYCIN 1 SENSITIVE Sensitive     GENTAMICIN SYNERGY RESISTANT Resistant     * ENTEROCOCCUS FAECALIS  Blood Culture ID Panel (Reflexed)     Status: Abnormal   Collection Time: 11/15/22 12:23 PM  Result Value Ref Range Status   Enterococcus faecalis NOT DETECTED NOT DETECTED Final   Enterococcus Faecium NOT DETECTED NOT DETECTED Final   Listeria monocytogenes NOT DETECTED NOT DETECTED Final   Staphylococcus species NOT DETECTED NOT DETECTED Final   Staphylococcus aureus (BCID) NOT DETECTED NOT DETECTED Final   Staphylococcus epidermidis NOT DETECTED NOT DETECTED Final   Staphylococcus lugdunensis NOT DETECTED NOT DETECTED Final   Streptococcus species NOT DETECTED NOT DETECTED Final   Streptococcus agalactiae NOT DETECTED NOT DETECTED Final   Streptococcus pneumoniae NOT DETECTED NOT  DETECTED Final   Streptococcus pyogenes NOT DETECTED NOT DETECTED Final   A.calcoaceticus-baumannii DETECTED (A) NOT DETECTED Final    Comment: CRITICAL RESULT CALLED TO, READ BACK BY AND VERIFIED WITH: PHARMD FRANK WILSON  ON 11/16/22 @ 1444 BY DRT    Bacteroides fragilis NOT DETECTED NOT DETECTED Final   Enterobacterales NOT DETECTED NOT DETECTED Final   Enterobacter cloacae complex NOT DETECTED NOT DETECTED Final   Escherichia coli NOT DETECTED NOT DETECTED Final   Klebsiella aerogenes NOT DETECTED NOT DETECTED Final   Klebsiella oxytoca NOT DETECTED NOT DETECTED Final   Klebsiella pneumoniae NOT DETECTED NOT DETECTED Final   Proteus species NOT DETECTED NOT DETECTED Final   Salmonella species NOT DETECTED NOT DETECTED Final   Serratia marcescens NOT DETECTED NOT DETECTED Final   Haemophilus influenzae NOT DETECTED NOT DETECTED Final   Neisseria meningitidis NOT DETECTED NOT DETECTED Final   Pseudomonas aeruginosa NOT DETECTED NOT DETECTED Final    Stenotrophomonas maltophilia NOT DETECTED NOT DETECTED Final   Candida albicans NOT DETECTED NOT DETECTED Final   Candida auris NOT DETECTED NOT DETECTED Final   Candida glabrata NOT DETECTED NOT DETECTED Final   Candida krusei NOT DETECTED NOT DETECTED Final   Candida parapsilosis NOT DETECTED NOT DETECTED Final   Candida tropicalis NOT DETECTED NOT DETECTED Final   Cryptococcus neoformans/gattii NOT DETECTED NOT DETECTED Final   CTX-M ESBL NOT DETECTED NOT DETECTED Final   Carbapenem resistance IMP NOT DETECTED NOT DETECTED Final   Carbapenem resistance KPC NOT DETECTED NOT DETECTED Final   Carbapenem resistance NDM NOT DETECTED NOT DETECTED Final   Carbapenem resistance VIM NOT DETECTED NOT DETECTED Final    Comment: Performed at [facility], [address] [place], [place] [address]  Blood Culture (routine x 2)     Status: None (Preliminary result)   Collection Time: 11/15/22 12:28 PM   Specimen: BLOOD LEFT HAND  Result Value Ref Range Status   Specimen Description BLOOD LEFT HAND  Final   Special Requests   Final    BOTTLES DRAWN AEROBIC ONLY Blood Culture results may not be optimal due to an inadequate volume of blood received in culture bottles   Culture   Final    NO GROWTH 3 DAYS Performed at [facility], [address], [place], [place] [address]    Report Status PENDING  Incomplete  Resp panel by RT-PCR (RSV, Flu A&B, Covid) Anterior Nasal Swab     Status: None   Collection Time: 11/15/22  1:28 PM   Specimen: Anterior Nasal Swab  Result Value Ref Range Status   SARS Coronavirus 2 by RT PCR NEGATIVE NEGATIVE Final    Comment: (NOTE) SARS-CoV-2 target nucleic acids are NOT DETECTED.  The SARS-CoV-2 RNA is generally detectable in upper respiratory specimens during the acute phase of infection. The lowest concentration of SARS-CoV-2 viral copies this assay can detect is 138 copies/mL. A negative result does not preclude SARS-Cov-2 infection and should not be used  as the sole basis for treatment or other patient management decisions. A negative result may occur with  improper specimen collection/handling, submission of specimen other than nasopharyngeal swab, presence of viral mutation(s) within the areas targeted by this assay, and inadequate number of viral copies(<138 copies/mL). A negative result must be combined with clinical observations, patient history, and epidemiological information. The expected result is Negative.  Fact Sheet for Patients:  EntrepreneurPulse.com.au  Fact Sheet for Healthcare Providers:  IncredibleEmployment.be  This test is no t yet approved  or cleared by the [place] and  has been authorized for detection and/or diagnosis of SARS-CoV-2 by FDA under an Emergency Use Authorization (EUA). This EUA will remain  in effect (meaning this test can be used) for the duration of the COVID-19 declaration under Section 564(b)(1) of the Act, 21 U.S.C.section 360bbb-3(b)(1), unless the authorization is terminated  or revoked sooner.       Influenza A by PCR NEGATIVE NEGATIVE Final   Influenza B by PCR NEGATIVE NEGATIVE Final    Comment: (NOTE) The Xpert Xpress SARS-CoV-2/FLU/RSV plus assay is intended as an aid in the diagnosis of influenza from Nasopharyngeal swab specimens and should not be used as a sole basis for treatment. Nasal washings and aspirates are unacceptable for Xpert Xpress SARS-CoV-2/FLU/RSV testing.  Fact Sheet for Patients: EntrepreneurPulse.com.au  Fact Sheet for Healthcare Providers: IncredibleEmployment.be  This test is not yet approved or cleared by the [place] FDA and has been authorized for detection and/or diagnosis of SARS-CoV-2 by FDA under an Emergency Use Authorization (EUA). This EUA will remain in effect (meaning this test can be used) for the duration of the COVID-19 declaration under Section 564(b)(1)  of the Act, 21 U.S.C. section 360bbb-3(b)(1), unless the authorization is terminated or revoked.     Resp Syncytial Virus by PCR NEGATIVE NEGATIVE Final    Comment: (NOTE) Fact Sheet for Patients: EntrepreneurPulse.com.au  Fact Sheet for Healthcare Providers: IncredibleEmployment.be  This test is not yet approved or cleared by the [place] FDA and has been authorized for detection and/or diagnosis of SARS-CoV-2 by FDA under an Emergency Use Authorization (EUA). This EUA will remain in effect (meaning this test can be used) for the duration of the COVID-19 declaration under Section 564(b)(1) of the Act, 21 U.S.C. section 360bbb-3(b)(1), unless the authorization is terminated or revoked.  Performed at [facility] [address] [place], [place] [address]   Culture, blood (Routine X 2) w Reflex to ID Panel     Status: None (Preliminary result)   Collection Time: 11/17/22  4:52 PM   Specimen: BLOOD RIGHT HAND  Result Value Ref Range Status   Specimen Description BLOOD RIGHT HAND  Final   Special Requests   Final    BOTTLES DRAWN AEROBIC AND ANAEROBIC Blood Culture results may not be optimal due to an inadequate volume of blood received in culture bottles   Culture   Final    NO GROWTH < 12 HOURS Performed at [facility], [address], [place], [place] [address]    Report Status PENDING  Incomplete  Culture, blood (Routine X 2) w Reflex to ID Panel     Status: None (Preliminary result)   Collection Time: 11/17/22  6:30 PM   Specimen: BLOOD LEFT HAND  Result Value Ref Range Status   Specimen Description BLOOD LEFT HAND  Final   Special Requests   Final    BOTTLES DRAWN AEROBIC ONLY Blood Culture adequate volume   Culture   Final    NO GROWTH < 12 HOURS Performed at [facility], [address] [place], [place] [address]    Report Status PENDING  Incomplete     Scheduled Meds:  atorvastatin  80 mg Oral Daily   benztropine  0.5 mg  Oral Daily   ciprofloxacin  500 mg Oral BID   divalproex  1,000 mg Oral QHS   divalproex  250 mg Oral Daily   hydroxychloroquine  200 mg Oral BID   insulin aspart  0-5 Units Subcutaneous  QHS   insulin aspart  0-9 Units Subcutaneous TID WC   linezolid  600 mg Oral Q12H   metoprolol succinate  25 mg Oral Daily   pantoprazole  40 mg Oral BID   risperiDONE  1 mg Oral QHS   risperiDONE  6 mg Oral QHS   sertraline  50 mg Oral Daily   traZODone  100 mg Oral QHS    Procedures/Studies: ECHOCARDIOGRAM COMPLETE  Result Date: 11/18/2022    ECHOCARDIOGRAM REPORT   Patient Name:   [name] Date of Exam: 11/18/2022 Medical Rec #:  [id number]      Height:       66.0 in Accession #:    [id number]     Weight:       228.4 lb Date of Birth:  10-14-1965     BSA:          2.116 m Patient Age:    43 years       BP:           105/56 mmHg Patient Gender: M              HR:           91 bpm. Exam Location:  [facility] Procedure: 2D Echo, Cardiac Doppler and Color Doppler Indications:    Bacteremia  History:        Patient has prior history of Echocardiogram examinations, most                 recent 11/13/2020. Cirrhosis; Risk Factors:Diabetes, Current                 Smoker and Dyslipidemia.  Sonographer:    Johny Chess RDCS Referring Phys: [phone] Dauna Ziska  Sonographer Comments: Technically difficult study due to poor echo windows. Image acquisition challenging due to respiratory motion. IMPRESSIONS  1. Left ventricular ejection fraction, by estimation, is 60 to 65%. The left ventricle has normal function. Left ventricular endocardial border not optimally defined to evaluate regional wall motion. Left ventricular diastolic parameters were normal.  2. Right ventricular systolic function is normal. The right ventricular size is normal. Tricuspid regurgitation signal is inadequate for assessing PA pressure.  3. Left atrial size was mildly dilated.  4. The mitral valve is grossly normal. Trivial mitral valve  regurgitation.  5. The aortic valve is tricuspid. Aortic valve regurgitation is not visualized.  6. The inferior vena cava is normal in size with greater than 50% respiratory variability, suggesting right atrial pressure of 3 mmHg.  7. No obvious valvular vegetations noted with significantly limited views. Comparison(s): Prior images reviewed side by side. LVEF remains normal range at 60-65%. FINDINGS  Left Ventricle: Left ventricular ejection fraction, by estimation, is 60 to 65%. The left ventricle has normal function. Left ventricular endocardial border not optimally defined to evaluate regional wall motion. The left ventricular internal cavity size was normal in size. There is no left ventricular hypertrophy. Left ventricular diastolic parameters were normal. Right Ventricle: The right ventricular size is normal. No increase in right ventricular wall thickness. Right ventricular systolic function is normal. Tricuspid regurgitation signal is inadequate for assessing PA pressure. Left Atrium: Left atrial size was mildly dilated. Right Atrium: Right atrial size was normal in size. Pericardium: There is no evidence of pericardial effusion. Presence of epicardial fat layer. Mitral Valve: The mitral valve is grossly normal. Trivial mitral valve regurgitation. Tricuspid Valve: The tricuspid valve is grossly normal. Tricuspid valve regurgitation is trivial. Aortic Valve: The  aortic valve is tricuspid. There is mild aortic valve annular calcification. Aortic valve regurgitation is not visualized. Pulmonic Valve: The pulmonic valve was grossly normal. Pulmonic valve regurgitation is trivial. Aorta: The aortic root is normal in size and structure. Venous: The inferior vena cava is normal in size with greater than 50% respiratory variability, suggesting right atrial pressure of 3 mmHg. IAS/Shunts: No atrial level shunt detected by color flow Doppler.  LEFT VENTRICLE PLAX 2D LVIDd:         4.40 cm   Diastology LVIDs:          2.80 cm   LV e' medial:    9.25 cm/s LV PW:         0.90 cm   LV E/e' medial:  10.5 LV IVS:        0.70 cm   LV e' lateral:   15.30 cm/s LVOT diam:     1.80 cm   LV E/e' lateral: 6.4 LV SV:         54 LV SV Index:   26 LVOT Area:     2.54 cm  RIGHT VENTRICLE             IVC RV S prime:     13.60 cm/s  IVC diam: 1.40 cm TAPSE (M-mode): 4.0 cm LEFT ATRIUM           Index        RIGHT ATRIUM           Index LA diam:      3.00 cm 1.42 cm/m   RA Area:     10.70 cm LA Vol (A4C): 77.8 ml 36.77 ml/m  RA Volume:   23.60 ml  11.15 ml/m  AORTIC VALVE LVOT Vmax:   131.00 cm/s LVOT Vmean:  84.800 cm/s LVOT VTI:    0.214 m  AORTA Ao Root diam: 2.60 cm MITRAL VALVE MV Area (PHT): 3.93 cm    SHUNTS MV Decel Time: 193 msec    Systemic VTI:  0.21 m MV E velocity: 97.40 cm/s  Systemic Diam: 1.80 cm MV A velocity: 99.00 cm/s MV E/A ratio:  0.98 Rozann Lesches MD Electronically signed by Rozann Lesches MD Signature Date/Time: 11/18/2022/2:31:20 PM    Final    CT CHEST ABDOMEN PELVIS W CONTRAST  Result Date: 11/15/2022 CLINICAL DATA:  Sepsis, polytrauma, fall EXAM: CT CHEST, ABDOMEN, AND PELVIS WITH CONTRAST TECHNIQUE: Multidetector CT imaging of the chest, abdomen and pelvis was performed following the standard protocol during bolus administration of intravenous contrast. RADIATION DOSE REDUCTION: This exam was performed according to the departmental dose-optimization program which includes automated exposure control, adjustment of the mA and/or kV according to patient size and/or use of iterative reconstruction technique. CONTRAST:  162m OMNIPAQUE IOHEXOL 300 MG/ML  SOLN COMPARISON:  CT abdomen pelvis, 09/06/2022 CT chest, 04/15/2022 FINDINGS: CT CHEST FINDINGS Cardiovascular: Right chest port catheter. Aortic atherosclerosis. Normal heart size. Left coronary artery calcifications. No pericardial effusion. Mediastinum/Nodes: No enlarged mediastinal, hilar, or axillary lymph nodes. Small incidental diverticulum of the  posterior right trachea at the level of the thoracic inlet (series 2, image 8). Thyroid gland and esophagus demonstrate no significant findings. Lungs/Pleura: Moderate right pleural effusion and associated atelectasis or consolidation. Musculoskeletal: No chest wall abnormality. No acute osseous findings. Status post bilateral shoulder arthroplasty. CT ABDOMEN PELVIS FINDINGS Hepatobiliary: Coarse contour of the liver. No gallstones, gallbladder wall thickening, or biliary dilatation. Pancreas: Unremarkable. No pancreatic ductal dilatation or surrounding inflammatory changes. Spleen: Normal in size  without significant abnormality. Adrenals/Urinary Tract: Adrenal glands are unremarkable. Kidneys are normal, without renal calculi, solid lesion, or hydronephrosis. Malpositioned Foley catheter, tip and balloon within the prostatic or bulbous urethra (series 5, image 111) Stomach/Bowel: Stomach is within normal limits. 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 Vascular/Lymphatic: Aortic atherosclerosis. 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 No enlarged abdominal or pelvic lymph nodes. Reproductive: No mass or other abnormality. Other: No abdominal wall hernia or abnormality. Small volume perihepatic ascites. Musculoskeletal: No acute osseous findings. 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 IMPRESSION: 1. No CT evidence of acute traumatic injury to the chest, abdomen, or pelvis. 2. Moderate right pleural effusion and associated atelectasis or consolidation. 3. Malpositioned Foley catheter, tip and balloon within the prostatic or bulbous urethra. Recommend repositioning. 4. 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 5. Small volume perihepatic ascites. 6. Coarse contour of the liver and gastric varices, suggestive of cirrhosis. 7. Coronary artery disease. Aortic Atherosclerosis (ICD10-I70.0). Electronically Signed   By: Delanna Ahmadi M.D.   On: 11/15/2022 14:32   CT Head Wo Contrast  Result Date: 11/15/2022 CLINICAL DATA:  Three falls in the past 24 hours. EXAM: CT HEAD WITHOUT CONTRAST CT CERVICAL SPINE WITHOUT CONTRAST TECHNIQUE: Multidetector CT imaging of the head and cervical spine was performed following the standard protocol without intravenous contrast. Multiplanar CT image reconstructions of the cervical spine were also generated. RADIATION DOSE REDUCTION: This exam was performed according to the departmental dose-optimization program which includes automated exposure control, adjustment of the mA and/or kV according to patient size and/or use of iterative reconstruction technique. COMPARISON:  CT head 05/16/2021 FINDINGS: CT HEAD FINDINGS Brain: There is no acute intracranial hemorrhage, extra-axial fluid collection, or acute infarct. Parenchymal volume is normal. The ventricles are normal in size. Gray-white differentiation is preserved. The pituitary and suprasellar region are normal. There is no mass lesion. There is no mass effect or midline shift. Vascular: No hyperdense vessel or unexpected calcification. Skull: Normal. Negative for fracture or focal lesion. Sinuses/Orbits: There is mild mucosal thickening in the paranasal sinuses. The globes and orbits are unremarkable. Other: None. CT CERVICAL SPINE FINDINGS Alignment: Normal. There is no jumped or perched facet or other evidence of traumatic malalignment. Skull base and vertebrae: Skull base alignment is maintained. Vertebral body heights are preserved. There is no evidence of acute fracture. There is no suspicious osseous lesion. Soft tissues and spinal canal: No prevertebral fluid or swelling. No visible canal hematoma. Disc levels: Facet arthropathy is most advanced on the right at C3-C4. There is overall mild disc space narrowing at C4-C5 and C5-C6. There is no convincing high-grade spinal canal stenosis. Upper chest: Assessed on the separately dictated CT  chest. Other: None. IMPRESSION: 1. No acute intracranial pathology. 2. No acute fracture or traumatic malalignment of the cervical spine. Electronically Signed   By: Valetta Mole M.D.   On: 11/15/2022 14:24   CT Cervical Spine Wo Contrast  Result Date: 11/15/2022 CLINICAL DATA:  Three falls in the past 24 hours. EXAM: CT HEAD WITHOUT CONTRAST CT CERVICAL SPINE WITHOUT CONTRAST TECHNIQUE: Multidetector CT imaging of the head and cervical spine was performed following the standard protocol without intravenous contrast. Multiplanar CT image reconstructions of the cervical spine were also generated. RADIATION DOSE REDUCTION: This exam was performed according to the departmental dose-optimization program which includes automated exposure control, adjustment of the mA and/or kV according to patient size and/or use of  iterative reconstruction technique. COMPARISON:  CT head 05/16/2021 FINDINGS: CT HEAD FINDINGS Brain: There is no acute intracranial hemorrhage, extra-axial fluid collection, or acute infarct. Parenchymal volume is normal. The ventricles are normal in size. Gray-white differentiation is preserved. The pituitary and suprasellar region are normal. There is no mass lesion. There is no mass effect or midline shift. Vascular: No hyperdense vessel or unexpected calcification. Skull: Normal. Negative for fracture or focal lesion. Sinuses/Orbits: There is mild mucosal thickening in the paranasal sinuses. The globes and orbits are unremarkable. Other: None. CT CERVICAL SPINE FINDINGS Alignment: Normal. There is no jumped or perched facet or other evidence of traumatic malalignment. Skull base and vertebrae: Skull base alignment is maintained. Vertebral body heights are preserved. There is no evidence of acute fracture. There is no suspicious osseous lesion. Soft tissues and spinal canal: No prevertebral fluid or swelling. No visible canal hematoma. Disc levels: Facet arthropathy is most advanced on the right at  C3-C4. There is overall mild disc space narrowing at C4-C5 and C5-C6. There is no convincing high-grade spinal canal stenosis. Upper chest: Assessed on the separately dictated CT chest. Other: None. IMPRESSION: 1. No acute intracranial pathology. 2. No acute fracture or traumatic malalignment of the cervical spine. Electronically Signed   By: Valetta Mole M.D.   On: 11/15/2022 14:24   DG Chest Port 1 View  Result Date: 11/15/2022 CLINICAL DATA:  Sepsis, possible UTI EXAM: PORTABLE CHEST 1 VIEW COMPARISON:  CT chest dated 03/16/2022 FINDINGS: Lungs are clear.  No pleural effusion or pneumothorax. The heart is normal in size. Right chest power port terminates at the cavoatrial junction. Bilateral shoulder arthroplasty. IMPRESSION: No evidence of acute cardiopulmonary disease. Electronically Signed   By: Julian Hy M.D.   On: 11/15/2022 13:35    Barton Dubois, MD  [facility]  If 7PM-7AM, please contact night-coverage [email or web address] Password [email or web address] 11/18/2022, 4:10 PM   LOS: 2 days

## 2022-11-18 NOTE — Progress Notes (Signed)
Physical Therapy Treatment Patient Details Name: [name] MRN: [id number] DOB: January 09, 1966 Today's Date: 11/18/2022   History of Present Illness 57 year old male history of type 2 diabetes, Crohn's disease status post ileostomy, history of recurrent DVT/PE on Eliquis, bipolar disorder who presents to the ER today with multiple falls in the last 24 hours.  Patient is a very poor historian.  He is confused.  He is not oriented to time.  Patient giving confusing answers to his questions.  Instead of stating he does not know the answer the question, patient is making up answers.     Patient was seen on 05 November 2022 by liver transplant at [facility].  He was noted to have nonalcoholic fatty liver disease along with cirrhosis.  Autoimmune workup was started.  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  Has not had follow-up yet.    PT Comments    Patient requiring assist and frequent cueing for transition to seated EOB. He initially requires assist for sitting balance but is able to maintain after a few minutes. He completes sitting exercises with quick fatigue requiring intermittent rest breaks. Patient attempts to transfer to standing with RW and assist but is unable due to weakness. Patient assisted back to bed at end of session. Patient will benefit from continued skilled physical therapy in hospital and recommended venue below to increase strength, balance, endurance for safe ADLs and gait.    Recommendations for follow up therapy are one component of a multi-disciplinary discharge planning process, led by the attending physician.  Recommendations may be updated based on patient status, additional functional criteria and insurance authorization.  Follow Up Recommendations  Skilled nursing-short term rehab (<3 hours/day) Can patient physically be transported by private vehicle: No   Assistance Recommended at Discharge Frequent or constant Supervision/Assistance  Patient can  return home with the following A lot of help with bathing/dressing/bathroom;A lot of help with walking and/or transfers;Help with stairs or ramp for entrance;Assistance with cooking/housework   Equipment Recommendations  None recommended by PT    Recommendations for Other Services       Precautions / Restrictions Precautions Precautions: Fall Restrictions Weight Bearing Restrictions: No     Mobility  Bed Mobility Overal bed mobility: Needs Assistance Bed Mobility: Supine to Sit     Supine to sit: Mod assist     General bed mobility comments: increased time, labored movement    Transfers                        Ambulation/Gait                   Stairs             Wheelchair Mobility    Modified Rankin (Stroke Patients Only)       Balance Overall balance assessment: Needs assistance Sitting-balance support: Feet supported, No upper extremity supported Sitting balance-Leahy Scale: Poor Sitting balance - Comments: fair/poor seated at EOB                                    Cognition Arousal/Alertness: Awake/alert Behavior During Therapy: WFL for tasks assessed/performed Overall Cognitive Status: Within Functional Limits for tasks assessed  Exercises General Exercises - Lower Extremity Long Arc Quad: AROM, Both, 10 reps, Seated Hip Flexion/Marching: AROM, Both, 10 reps, Seated Toe Raises: AROM, Both, 10 reps, Seated Heel Raises: AROM, Both, 10 reps, Seated    General Comments        Pertinent Vitals/Pain Pain Assessment Pain Assessment: No/denies pain    Home Living                          Prior Function            PT Goals (current goals can now be found in the care plan section) Acute Rehab PT Goals Patient Stated Goal: return home with home aides to assist Time For Goal Achievement: 11/30/22 Potential to Achieve Goals: Good Progress  towards PT goals: Progressing toward goals    Frequency    Min 3X/week      PT Plan Current plan remains appropriate    Co-evaluation              AM-PAC PT "6 Clicks" Mobility   Outcome Measure  Help needed turning from your back to your side while in a flat bed without using bedrails?: A Lot Help needed moving from lying on your back to sitting on the side of a flat bed without using bedrails?: A Lot Help needed moving to and from a bed to a chair (including a wheelchair)?: A Lot Help needed standing up from a chair using your arms (e.g., wheelchair or bedside chair)?: A Lot Help needed to walk in hospital room?: A Lot Help needed climbing 3-5 steps with a railing? : Total 6 Click Score: 11    End of Session Equipment Utilized During Treatment: Gait belt Activity Tolerance: Patient tolerated treatment well;Patient limited by fatigue Patient left: in bed;with call bell/phone within reach;with bed alarm set;with nursing/sitter in room Nurse Communication: Mobility status PT Visit Diagnosis: Unsteadiness on feet (R26.81);Other abnormalities of gait and mobility (R26.89);Muscle weakness (generalized) (M62.81)     Time: [id number] PT Time Calculation (min) (ACUTE ONLY): 19 min  Charges:  $Therapeutic Exercise: 8-22 mins                     9:56 AM, 11/18/22 Mearl Latin PT, DPT Physical Therapist at [facility]

## 2022-11-18 NOTE — Progress Notes (Signed)
  Echocardiogram 2D Echocardiogram has been performed.  [name] 11/18/2022, 1:54 PM

## 2022-11-18 NOTE — Progress Notes (Signed)
Upon assessment pt has several small blisters on L AC area, pt states he is allergic to regular tape. Area covered with small square mepilex for protection.

## 2022-11-19 ENCOUNTER — Inpatient Hospital Stay: Payer: Medicaid Other

## 2022-11-19 DIAGNOSIS — K50819 Crohn's disease of both small and large intestine with unspecified complications: Secondary | ICD-10-CM | POA: Diagnosis not present

## 2022-11-19 DIAGNOSIS — E876 Hypokalemia: Secondary | ICD-10-CM | POA: Diagnosis not present

## 2022-11-19 DIAGNOSIS — J90 Pleural effusion, not elsewhere classified: Secondary | ICD-10-CM | POA: Diagnosis not present

## 2022-11-19 DIAGNOSIS — Z86718 Personal history of other venous thrombosis and embolism: Secondary | ICD-10-CM | POA: Diagnosis not present

## 2022-11-19 LAB — GLUCOSE, CAPILLARY
Glucose-Capillary: 103 mg/dL — ABNORMAL HIGH (ref 70–99)
Glucose-Capillary: 106 mg/dL — ABNORMAL HIGH (ref 70–99)
Glucose-Capillary: 103 mg/dL — ABNORMAL HIGH (ref 70–99)
Glucose-Capillary: 124 mg/dL — ABNORMAL HIGH (ref 70–99)

## 2022-11-19 NOTE — TOC Progression Note (Signed)
30 Day Note   Patient Details  Name: [name] MRN: [id number] Date of Birth: [birth year]  Transition of Care [facility]) CM/SW Contact  Boneta Lucks, RN Phone Number: 11/19/2022, 12:47 PM  To whom it May Concern: Please be advised that the above name patient will require a short-term nursing home stay- anticipated 30 days or less rehabilitation and strengthening. The plan is for return home.     Expected Discharge Plan: Skilled Nursing Facility Barriers to Discharge: Continued Medical Work up

## 2022-11-19 NOTE — [place] FL2 (Signed)
[place] LEVEL OF CARE FORM     IDENTIFICATION  Patient Name: [name] Birthdate: 12-Sep-1966 Sex: male Admission Date (Current Location): 11/15/2022  [facility] [place] Number:  [employer] and Address:  [place] [address], [place]      Provider Number: [id number]  Attending Physician Name and Address:  Barton Dubois, MD  Relative Name and Phone Number:  Rosine Door (Sister) [phone]    Current Level of Care: Hospital Recommended Level of Care: [place] Prior Approval Number:    Date Approved/Denied:   PASRR Number:    Discharge Plan: SNF    Current Diagnoses: Patient Active Problem List   Diagnosis Date Noted   Failure to thrive in adult 11/16/2022   Hypokalemia 11/15/2022   Liver cirrhosis secondary to nonalcoholic steatohepatitis (NASH) ([place]) 11/15/2022   Pleural effusion on right 11/15/2022   Pancytopenia ([place]) 09/07/2022   Hypoalbuminemia due to protein-calorie malnutrition ([place]) 09/07/2022   History of DVT (deep vein thrombosis) 09/07/2022   History of pulmonary embolism 09/07/2022   Iron deficiency anemia 06/17/2022   Bladder neck contracture [id number]   Acute metabolic encephalopathy [id number]   Hyponatremia 05/16/2021   DM type 2 (diabetes mellitus, type 2) ([place]) 05/16/2021   Recurrent deep vein thrombosis (DVT) ([place]) 07/28/2020   At risk for osteopenia 02/06/2020   BPH (benign prostatic hyperplasia) 12/03/2019   Incomplete emptying of bladder 10/31/2019   Benign prostatic hyperplasia with urinary obstruction 10/31/2019   Lactic acidosis 02/10/2018   Arthritis of knee, degenerative 02/14/2013   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 03/29/2012   Crohn's disease of both small and large intestine with complication ([place]) [id number]   Hepatic steatosis 07/19/2011   Hyperlipidemia 02/11/2009   BIPOLAR AFFECTIVE DISORDER 02/11/2009   Nystagmus  02/11/2009   ASTHMA 02/11/2009   GERD 02/11/2009   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   INSOMNIA 02/11/2009    Orientation RESPIRATION BLADDER Height & Weight     Self, Place  Normal Indwelling catheter Weight: 106.7 kg Height:  5' 6"$  (167.6 cm)  BEHAVIORAL SYMPTOMS/MOOD NEUROLOGICAL BOWEL NUTRITION STATUS      Ileostomy Diet (See DC summary)  AMBULATORY STATUS COMMUNICATION OF NEEDS Skin   Extensive Assist Verbally Normal                       Personal Care Assistance Level of Assistance  Bathing, Feeding, Dressing Bathing Assistance: Maximum assistance Feeding assistance: Limited assistance Dressing Assistance: Maximum assistance     Functional Limitations Info  Sight, Hearing, Speech Sight Info: Impaired Hearing Info: Adequate Speech Info: Adequate    SPECIAL CARE FACTORS FREQUENCY  PT (By licensed PT)     PT Frequency: 5times              Contractures Contractures Info: Not present    Additional Factors Info  Code Status Code Status Info: Full             Current Medications (11/19/2022):  This is the current hospital active medication list Current Facility-Administered Medications  Medication Dose Route Frequency Provider Last Rate Last Admin   acetaminophen (TYLENOL) tablet 650 mg  650 mg Oral Q6H PRN Kristopher Oppenheim, DO       Or   acetaminophen (TYLENOL) suppository 650 mg  650 mg Rectal Q6H PRN Kristopher Oppenheim, DO       albuterol (PROVENTIL) (2.5 MG/3ML) 0.083% nebulizer solution 2.5 mg  2.5 mg  Nebulization Q6H PRN Kristopher Oppenheim, DO       atorvastatin (LIPITOR) tablet 80 mg  80 mg Oral Daily Kristopher Oppenheim, DO   80 mg at 11/19/22 0912   benztropine (COGENTIN) tablet 0.5 mg  0.5 mg Oral Daily Kristopher Oppenheim, DO   0.5 mg at 11/19/22 [id number]   ciprofloxacin (CIPRO) tablet 500 mg  500 mg Oral BID Tommy Medal, Lavell Islam, MD   500 mg at 11/19/22 0911   divalproex (DEPAKOTE) DR tablet 1,000 mg  1,000 mg Oral QHS Kristopher Oppenheim, DO   1,000 mg at 11/18/22 2116   divalproex  (DEPAKOTE) DR tablet 250 mg  250 mg Oral Daily Kristopher Oppenheim, DO   250 mg at 11/19/22 [id number]   hydroxychloroquine (PLAQUENIL) tablet 200 mg  200 mg Oral BID Kristopher Oppenheim, DO   200 mg at 11/19/22 [id number]   insulin aspart (novoLOG) injection 0-5 Units  0-5 Units Subcutaneous QHS Kristopher Oppenheim, DO       insulin aspart (novoLOG) injection 0-9 Units  0-9 Units Subcutaneous TID WC Kristopher Oppenheim, DO   2 Units at 11/18/22 1635   linezolid (ZYVOX) tablet 600 mg  600 mg Oral Q12H Tommy Medal, Lavell Islam, MD   600 mg at 11/19/22 0913   metoprolol succinate (TOPROL-XL) 24 hr tablet 25 mg  25 mg Oral Daily Kristopher Oppenheim, DO   25 mg at 11/19/22 0912   ondansetron (ZOFRAN) tablet 4 mg  4 mg Oral Q6H PRN Kristopher Oppenheim, DO       Or   ondansetron [facility]) injection 4 mg  4 mg Intravenous Q6H PRN Kristopher Oppenheim, DO       Oral care mouth rinse  15 mL Mouth Rinse PRN Tat, Shanon Brow, MD       pantoprazole (PROTONIX) EC tablet 40 mg  40 mg Oral BID Kristopher Oppenheim, DO   40 mg at 11/19/22 0912   risperiDONE (RISPERDAL) tablet 1 mg  1 mg Oral QHS Kristopher Oppenheim, DO   1 mg at 11/18/22 2121   risperiDONE (RISPERDAL) tablet 6 mg  6 mg Oral QHS Kristopher Oppenheim, DO   6 mg at 11/18/22 2115   sertraline (ZOLOFT) tablet 50 mg  50 mg Oral Daily Kristopher Oppenheim, DO   50 mg at 11/19/22 0912   traZODone (DESYREL) tablet 100 mg  100 mg Oral QHS Kristopher Oppenheim, DO   100 mg at 11/18/22 2115     Discharge Medications: Please see discharge summary for a list of discharge medications.  Relevant Imaging Results:  Relevant Lab Results:   Additional Information SS# [id number]  Boneta Lucks, RN

## 2022-11-19 NOTE — TOC Progression Note (Signed)
Transition of Care [facility]) - Progression Note    Patient Details  Name: [name] MRN: [id number] Date of Birth: 08-24-1966  Transition of Care [facility]) CM/SW Contact  Boneta Lucks, RN Phone Number: 11/19/2022, 10:36 AM  Clinical Narrative:   Patient sister called, she does not know who the person listed on his chart as mother is, possibly an old group home staff member. She is agreeable to SNF. He needs SNF for PT and antibiotics. PASSR is pending. Uploaded documents requested. FL2 sent out for bed offers. TOC will follow up with her to give choices. Explained that we will not get many offers due to insurance.     Expected Discharge Plan: [place] Barriers to Discharge: Continued Medical Work up  Expected Discharge Plan and Services    SNF   Social Determinants of Health (SDOH) Interventions [place]: Food Insecurity Present (09/07/2022)  Housing: Low Risk  (09/07/2022)  Transportation Needs: No Transportation Needs (09/07/2022)  Utilities: Not At Risk (09/07/2022)  Tobacco Use: High Risk (11/15/2022)    Readmission Risk Interventions     No data to display

## 2022-11-19 NOTE — TOC Progression Note (Signed)
Transition of Care [facility]) - Progression Note    Patient Details  Name: [name] MRN: [id number] Date of Birth: 05/04/1966  [facility]) CM/SW Contact  Boneta Lucks, RN Phone Number: 11/19/2022, 3:18 PM  Clinical Narrative:   PASSR number received  [id number] E, No bed offers.    Expected Discharge Plan: [place] Barriers to Discharge: Continued Medical Work up  Expected Discharge Plan and Services        Social Determinants of Health (SDOH) Interventions [place]: Food Insecurity Present (09/07/2022)  Housing: Low Risk  (09/07/2022)  Transportation Needs: No Transportation Needs (09/07/2022)  Utilities: Not At Risk (09/07/2022)  Tobacco Use: High Risk (11/15/2022)

## 2022-11-19 NOTE — Progress Notes (Signed)
PROGRESS NOTE  [name] [id number] DOB: Jul 12, 1966 DOA: 11/15/2022 PCP: [facility]  Brief History:  57 year old male with a history of diabetes mellitus type 2, pancytopenia, recurrent DVT/PE on Eliquis, RA, Crohn's colitis status post colectomy with ileostomy 2009 presenting with multiple falls.  The patient is a poor historian. Patient was seen on 05 November 2022 by liver transplant at [facility].  He was noted to have nonalcoholic fatty liver disease along with cirrhosis.  Autoimmune workup was started.  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  Has not had follow-up yet.   Patient been taking Demadex 20 mg a day prescribed by his cardiologist due to lower extremity edema.  Patient has not been started on Aldactone yet. This was mentioned during his hepatology consult. Patient states that he is taking Aldactone but there is no recording of this in his chart.   The patient was recently hospitalized from 09/06/2022 to 09/09/2022 for GI bleed.  EGD  09/07/22 with 1cm hh, Erythematous mucosa in the gastric fundus. Treated w/ (APC).GAVE w/ bleeding. Treated w/ (APC). Erythematous duodenopathy.  The patient was discharged home with pantoprazole twice daily.  In the ED, the patient was noted to be tachycardic in the 110s.  He was hypotensive with systolic blood pressure 83.  The patient was given 3.5 L of fluid.  Lactic acid was 3.7.  He was admitted for further evaluation and treatment of SIRS and altered mentation    Assessment/Plan: Frequent falls/failure to thrive -TSH, B12, folate and ammonia within normal limits -Continue supportive care -Follow clinical response.  Generalized weakness -Multifactorial including deconditioning, hypokalemia, possible infectious process. -Following recommendations by infectious disease service will treat for 2 weeks using oral antibiotics (ciprofloxacin and Zyvox). -Follow physical therapy  evaluation/recommendations. -Currently with recommendation for a skilled nursing facility at discharge once medically stable.  Urinary retention -Foley catheter placed in the ED -CT shows tip of the catheter is in the prostatic urethra -Requested nursing to advance/adjust foley -Continue monitoring urine output -Patient denying dysuria and suprapubic tenderness at this moment. -Patient will benefit of outpatient follow-up with urology service; -plan is for patient to be discharged with Foley catheter in place..  Acute metabolic encephalopathy -Patient mentation slowly improving. -Ammonia 30 -CT brain negative -UA negative for pyuria -Positive bacteremia as mentioned below -Continue current antibiotics.  Lactic acidosis/enterococcal and Acinetobacter bacteremia -Suspect this is due to decreased clearance from his hepatic cirrhosis, versus associated with active infection. -Following ID recommendations 2D echo performed; no vegetations appreciated.  Holding on TEE at this moment -Based on cultures sensitivity antibiotics will be narrow to Cipro and Zyvox with plans for 2 weeks of therapy.  Hypokalemia -Repleted and is stable -Magnesium within normal limits -Continue to follow electrolytes trend.  Mild hyponatremia -Most likely associated with chronic cirrhosis and dehydration -Improved and pretty much back to baseline after fluid resuscitation provided -Continue to follow electrolytes trend.  Liver cirrhosis secondary to nonalcoholic steatohepatitis (NASH)  -Pt was seen by transplant hepatology at [facility] on 11-05-2022. He has outpatient labs that showed + ANA and + anti-smooth muscle antibody.  -This calls into question whether his cirrhosis is caused by autoimmune hepatitis. -Continue outpatient follow-up with GI service -LFTs appears to be stable. -Continue holding Aldactone and Demadex for now.  Crohn's disease of both small and large intestine with complication ([employer] [place]) -Pt with  ileostomy.  -Pt denies any high output from ileostomy -Continue  to maintain adequate hydration.  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  Bipolar -continue depakote, risperdal, sertraline, benztropine -Overall mood is stable currently.  Mixed Hyperlipidemia -continue statin -Heart healthy diet discussed with patient.  History DVTs/PE -Given concerns for blood in his ostomy bag we will hold Eliquis. -SCDs while in bed ordered.  Pancytopenia -chronic. -Continue patient follow-up with hematology service. -Continue close monitoring of patient's hemoglobin and platelet count. -Hemoglobin has remained stable.  Complaining of delusional effect following fluid resuscitation most likely playing a role in decreased hemoglobin.  Class 2 Obese -BMI 37.04 -Low-calorie diet and portion control discussed with patient.    Family Communication:   None family at bedside.  Consultants: Infectious disease.  Code Status:  FULL   DVT Prophylaxis:  SCDs   Procedures: As Listed in Progress Note Above  Antibiotics: Meropenem  Subjective: Weak and deconditioned; following commands appropriately and in no acute distress.  No significant signs of overt bleeding currently appreciated.  Patient is afebrile, no nausea, no vomiting.   Objective: Vitals:   11/18/22 1627 11/18/22 2157 11/19/22 0500 11/19/22 1314  BP: (!) 94/53 (!) 106/37  (!) 116/92  Pulse: 97 88  79  Resp: 14 14  18  $ Temp: 98.3 F (36.8 C) (!) 97.1 F (36.2 C)  98.2 F (36.8 C)  TempSrc: Oral     SpO2: 99% 97%  98%  Weight:   106.7 kg   Height:        Intake/Output Summary (Last 24 hours) at 11/19/2022 1651 Last data filed at 11/19/2022 1300 Gross per 24 hour  Intake 876.54 ml  Output 525 ml  Net 351.54 ml   Weight change: 3.1 kg  Exam: General exam: Alert, awake, oriented x 3; sitting on the chair and expressing no acute  complaints today.  Foley catheter in place noticing improvement in hematuria.  No fever, no nausea, no vomiting. Respiratory system: Clear to auscultation. Respiratory effort normal.  Sedation on room air. Cardiovascular system:RRR. No rubs or gallops. Gastrointestinal system: Abdomen is nondistended, soft and nontender.  Colostomy bag in place without signs of acute bleeding. Central nervous system: Alert and oriented. No focal neurological deficits. Extremities: No cyanosis or clubbing; 2+ edema appreciated bilaterally. Skin: No petechiae.  Chronic extensive dermatitis appreciated bilaterally. Psychiatry: Flat affect. No suicidal ideation or hallucinations.  Following commands appropriate.  Data Reviewed: I have personally reviewed following labs and imaging studies  Basic Metabolic Panel: Recent Labs  Lab 11/15/22 1223 11/15/22 1254 11/16/22 0417 11/17/22 0412 11/18/22 0408  NA 132* 136 134* 133* 131*  K 2.6* 2.8* 4.4 4.8 4.5  CL 99 97* 105 106 104  CO2 25  --  23 24 24  $ GLUCOSE 95 91 67* 77 87  BUN 11 10 10 14 16  $ CREATININE 1.15 1.20 0.95 1.02 0.93  CALCIUM 8.3*  --  7.9* 7.7* 7.9*  MG 1.8  --  1.8 2.0  --    Liver Function Tests: Recent Labs  Lab 11/15/22 1223 11/16/22 0417  AST 44* 43*  ALT 19 18  ALKPHOS 98 87  BILITOT 1.1 1.4*  PROT 5.5* 5.0*  ALBUMIN 1.9* 1.8*    Recent Labs  Lab 11/15/22 1905 11/17/22 0412  AMMONIA 30 66*   CBC: Recent Labs  Lab 11/15/22 1223 11/15/22 1254 11/16/22 0417 11/17/22 0412 11/18/22 0408  WBC 2.3*  --  3.6* 3.8* 4.4  NEUTROABS 1.2*  --  2.4  --   --   HGB 8.7* 9.2* 7.8* 7.2* 7.4*  HCT 27.0* 27.0* 24.6* 22.9* 22.7*  MCV 102.7*  --  102.5* 104.6* 103.7*  PLT 46*  --  46* 47* 59*   Cardiac Enzymes: Recent Labs  Lab 11/16/22 1201  CKTOTAL 215   CBG: Recent Labs  Lab 11/18/22 1109 11/18/22 1623 11/18/22 2124 11/19/22 0740 11/19/22 1153  GLUCAP 119* 167* 109* 103* 106*   Urine analysis:    Component  Value Date/Time   COLORURINE YELLOW 11/15/2022 1327   APPEARANCEUR CLEAR 11/15/2022 1327   APPEARANCEUR Cloudy (A) 08/25/2022 1417   LABSPEC 1.012 11/15/2022 1327   PHURINE 6.0 11/15/2022 1327   GLUCOSEU NEGATIVE 11/15/2022 1327   HGBUR NEGATIVE 11/15/2022 1327   BILIRUBINUR NEGATIVE 11/15/2022 1327   BILIRUBINUR Negative 08/25/2022 1417   KETONESUR NEGATIVE 11/15/2022 1327   PROTEINUR NEGATIVE 11/15/2022 1327   UROBILINOGEN 0.2 10/17/2014 1940   NITRITE NEGATIVE 11/15/2022 1327   LEUKOCYTESUR NEGATIVE 11/15/2022 1327   Sepsis Labs:  Recent Results (from the past 240 hour(s))  Blood Culture (routine x 2)     Status: Abnormal   Collection Time: 11/15/22 12:23 PM   Specimen: BLOOD RIGHT HAND  Result Value Ref Range Status   Specimen Description   Final    BLOOD RIGHT HAND Performed at [facility], [address], [place], [place] [address]    Special Requests   Final    BOTTLES DRAWN AEROBIC AND ANAEROBIC Blood Culture adequate volume Performed at [facility], [address] [place], [place] [address]    Culture  Setup Time   Final    GRAM POSITIVE COCCI BOTTLES DRAWN AEROBIC AND ANAEROBIC Gram Stain Report Called to,Read Back By and Verified With: [email or web address],[email or web address]$  BY MATTHEWS B 2.13.2024 CRITICAL RESULT CALLED TO, READ BACK BY AND VERIFIED WITH: PHARMD FRANK WILSON  ON 11/16/22 @ 1444 BY DRT Performed at [facility], [place] [address] [place], [place] [address]    Culture (A)  Final    ACINETOBACTER CALCOACETICUS/BAUMANNII COMPLEX ENTEROCOCCUS FAECALIS    Report Status 11/18/2022 FINAL  Final   Organism ID, Bacteria ACINETOBACTER CALCOACETICUS/BAUMANNII COMPLEX  Final   Organism ID, Bacteria ENTEROCOCCUS FAECALIS  Final      Susceptibility   Acinetobacter calcoaceticus/baumannii complex - MIC*    CEFTAZIDIME 8 SENSITIVE Sensitive     CIPROFLOXACIN 1 SENSITIVE Sensitive     GENTAMICIN 8 INTERMEDIATE Intermediate     IMIPENEM <=0.25 SENSITIVE Sensitive     PIP/TAZO <=4  SENSITIVE Sensitive     TRIMETH/SULFA <=20 SENSITIVE Sensitive     AMPICILLIN/SULBACTAM <=2 SENSITIVE Sensitive     * ACINETOBACTER CALCOACETICUS/BAUMANNII COMPLEX   Enterococcus faecalis - MIC*    AMPICILLIN <=2 SENSITIVE Sensitive     VANCOMYCIN 1 SENSITIVE Sensitive     GENTAMICIN SYNERGY RESISTANT Resistant     * ENTEROCOCCUS FAECALIS  Blood Culture ID Panel (Reflexed)     Status: Abnormal   Collection Time: 11/15/22 12:23 PM  Result Value Ref Range Status   Enterococcus faecalis NOT DETECTED NOT DETECTED Final   Enterococcus Faecium NOT DETECTED NOT DETECTED Final   Listeria monocytogenes NOT DETECTED NOT DETECTED Final   Staphylococcus species NOT DETECTED NOT DETECTED Final   Staphylococcus aureus (BCID) NOT DETECTED NOT DETECTED Final   Staphylococcus epidermidis NOT DETECTED NOT DETECTED Final   Staphylococcus lugdunensis NOT DETECTED NOT DETECTED Final   Streptococcus species NOT DETECTED NOT DETECTED Final   Streptococcus agalactiae NOT DETECTED NOT  DETECTED Final   Streptococcus pneumoniae NOT DETECTED NOT DETECTED Final   Streptococcus pyogenes NOT DETECTED NOT DETECTED Final   A.calcoaceticus-baumannii DETECTED (A) NOT DETECTED Final    Comment: CRITICAL RESULT CALLED TO, READ BACK BY AND VERIFIED WITH: PHARMD FRANK WILSON  ON 11/16/22 @ 1444 BY DRT    Bacteroides fragilis NOT DETECTED NOT DETECTED Final   Enterobacterales NOT DETECTED NOT DETECTED Final   Enterobacter cloacae complex NOT DETECTED NOT DETECTED Final   Escherichia coli NOT DETECTED NOT DETECTED Final   Klebsiella aerogenes NOT DETECTED NOT DETECTED Final   Klebsiella oxytoca NOT DETECTED NOT DETECTED Final   Klebsiella pneumoniae NOT DETECTED NOT DETECTED Final   Proteus species NOT DETECTED NOT DETECTED Final   Salmonella species NOT DETECTED NOT DETECTED Final   Serratia marcescens NOT DETECTED NOT DETECTED Final   Haemophilus influenzae NOT DETECTED NOT DETECTED Final   Neisseria meningitidis NOT  DETECTED NOT DETECTED Final   Pseudomonas aeruginosa NOT DETECTED NOT DETECTED Final   Stenotrophomonas maltophilia NOT DETECTED NOT DETECTED Final   Candida albicans NOT DETECTED NOT DETECTED Final   Candida auris NOT DETECTED NOT DETECTED Final   Candida glabrata NOT DETECTED NOT DETECTED Final   Candida krusei NOT DETECTED NOT DETECTED Final   Candida parapsilosis NOT DETECTED NOT DETECTED Final   Candida tropicalis NOT DETECTED NOT DETECTED Final   Cryptococcus neoformans/gattii NOT DETECTED NOT DETECTED Final   CTX-M ESBL NOT DETECTED NOT DETECTED Final   Carbapenem resistance IMP NOT DETECTED NOT DETECTED Final   Carbapenem resistance KPC NOT DETECTED NOT DETECTED Final   Carbapenem resistance NDM NOT DETECTED NOT DETECTED Final   Carbapenem resistance VIM NOT DETECTED NOT DETECTED Final    Comment: Performed at [facility], [address] [place], [place] [address]  Blood Culture (routine x 2)     Status: None (Preliminary result)   Collection Time: 11/15/22 12:28 PM   Specimen: BLOOD LEFT HAND  Result Value Ref Range Status   Specimen Description BLOOD LEFT HAND  Final   Special Requests   Final    BOTTLES DRAWN AEROBIC ONLY Blood Culture results may not be optimal due to an inadequate volume of blood received in culture bottles   Culture   Final    NO GROWTH 3 DAYS Performed at [facility], [address] [place], [place] [address]    Report Status PENDING  Incomplete  Resp panel by RT-PCR (RSV, Flu A&B, Covid) Anterior Nasal Swab     Status: None   Collection Time: 11/15/22  1:28 PM   Specimen: Anterior Nasal Swab  Result Value Ref Range Status   SARS Coronavirus 2 by RT PCR NEGATIVE NEGATIVE Final    Comment: (NOTE) SARS-CoV-2 target nucleic acids are NOT DETECTED.  The SARS-CoV-2 RNA is generally detectable in upper respiratory specimens during the acute phase of infection. The lowest concentration of SARS-CoV-2 viral copies this assay can detect is 138  copies/mL. A negative result does not preclude SARS-Cov-2 infection and should not be used as the sole basis for treatment or other patient management decisions. A negative result may occur with  improper specimen collection/handling, submission of specimen other than nasopharyngeal swab, presence of viral mutation(s) within the areas targeted by this assay, and inadequate number of viral copies(<138 copies/mL). A negative result must be combined with clinical observations, patient history, and epidemiological information. The expected result is Negative.  Fact Sheet for Patients:  EntrepreneurPulse.com.au  Fact Sheet for Healthcare Providers:  IncredibleEmployment.be  This test is no t yet approved or cleared by the [place] and  has been authorized for detection and/or diagnosis of SARS-CoV-2 by FDA under an Emergency Use Authorization (EUA). This EUA will remain  in effect (meaning this test can be used) for the duration of the COVID-19 declaration under Section 564(b)(1) of the Act, 21 U.S.C.section 360bbb-3(b)(1), unless the authorization is terminated  or revoked sooner.       Influenza A by PCR NEGATIVE NEGATIVE Final   Influenza B by PCR NEGATIVE NEGATIVE Final    Comment: (NOTE) The Xpert Xpress SARS-CoV-2/FLU/RSV plus assay is intended as an aid in the diagnosis of influenza from Nasopharyngeal swab specimens and should not be used as a sole basis for treatment. Nasal washings and aspirates are unacceptable for Xpert Xpress SARS-CoV-2/FLU/RSV testing.  Fact Sheet for Patients: EntrepreneurPulse.com.au  Fact Sheet for Healthcare Providers: IncredibleEmployment.be  This test is not yet approved or cleared by the [place] FDA and has been authorized for detection and/or diagnosis of SARS-CoV-2 by FDA under an Emergency Use Authorization (EUA). This EUA will remain in effect (meaning  this test can be used) for the duration of the COVID-19 declaration under Section 564(b)(1) of the Act, 21 U.S.C. section 360bbb-3(b)(1), unless the authorization is terminated or revoked.     Resp Syncytial Virus by PCR NEGATIVE NEGATIVE Final    Comment: (NOTE) Fact Sheet for Patients: EntrepreneurPulse.com.au  Fact Sheet for Healthcare Providers: IncredibleEmployment.be  This test is not yet approved or cleared by the [place] FDA and has been authorized for detection and/or diagnosis of SARS-CoV-2 by FDA under an Emergency Use Authorization (EUA). This EUA will remain in effect (meaning this test can be used) for the duration of the COVID-19 declaration under Section 564(b)(1) of the Act, 21 U.S.C. section 360bbb-3(b)(1), unless the authorization is terminated or revoked.  Performed at [facility], [address] [place], [place] [address]   Culture, blood (Routine X 2) w Reflex to ID Panel     Status: None (Preliminary result)   Collection Time: 11/17/22  4:52 PM   Specimen: BLOOD RIGHT HAND  Result Value Ref Range Status   Specimen Description BLOOD RIGHT HAND  Final   Special Requests   Final    BOTTLES DRAWN AEROBIC AND ANAEROBIC Blood Culture results may not be optimal due to an inadequate volume of blood received in culture bottles   Culture   Final    NO GROWTH < 12 HOURS Performed at [facility], [address] [place], [place] [address]    Report Status PENDING  Incomplete  Culture, blood (Routine X 2) w Reflex to ID Panel     Status: None (Preliminary result)   Collection Time: 11/17/22  6:30 PM   Specimen: BLOOD LEFT HAND  Result Value Ref Range Status   Specimen Description BLOOD LEFT HAND  Final   Special Requests   Final    BOTTLES DRAWN AEROBIC ONLY Blood Culture adequate volume   Culture   Final    NO GROWTH < 12 HOURS Performed at [facility], [address] [place], [place] [address]    Report Status  PENDING  Incomplete     Scheduled Meds:  atorvastatin  80 mg Oral Daily   benztropine  0.5 mg Oral Daily   ciprofloxacin  500 mg Oral BID   divalproex  1,000 mg Oral QHS   divalproex  250 mg Oral Daily   hydroxychloroquine  200 mg Oral  BID   insulin aspart  0-5 Units Subcutaneous QHS   insulin aspart  0-9 Units Subcutaneous TID WC   linezolid  600 mg Oral Q12H   metoprolol succinate  25 mg Oral Daily   pantoprazole  40 mg Oral BID   risperiDONE  1 mg Oral QHS   risperiDONE  6 mg Oral QHS   sertraline  50 mg Oral Daily   traZODone  100 mg Oral QHS    Procedures/Studies: ECHOCARDIOGRAM COMPLETE  Result Date: 11/18/2022    ECHOCARDIOGRAM REPORT   Patient Name:   [name] Date of Exam: 11/18/2022 Medical Rec #:  [id number]      Height:       66.0 in Accession #:    [id number]     Weight:       228.4 lb Date of Birth:  1966-08-25     BSA:          2.116 m Patient Age:    37 years       BP:           105/56 mmHg Patient Gender: M              HR:           91 bpm. Exam Location:  [facility] Procedure: 2D Echo, Cardiac Doppler and Color Doppler Indications:    Bacteremia  History:        Patient has prior history of Echocardiogram examinations, most                 recent 11/13/2020. Cirrhosis; Risk Factors:Diabetes, Current                 Smoker and Dyslipidemia.  Sonographer:    Johny Chess RDCS Referring Phys: [phone] Fatima Fedie  Sonographer Comments: Technically difficult study due to poor echo windows. Image acquisition challenging due to respiratory motion. IMPRESSIONS  1. Left ventricular ejection fraction, by estimation, is 60 to 65%. The left ventricle has normal function. Left ventricular endocardial border not optimally defined to evaluate regional wall motion. Left ventricular diastolic parameters were normal.  2. Right ventricular systolic function is normal. The right ventricular size is normal. Tricuspid regurgitation signal is inadequate for assessing PA pressure.  3. Left  atrial size was mildly dilated.  4. The mitral valve is grossly normal. Trivial mitral valve regurgitation.  5. The aortic valve is tricuspid. Aortic valve regurgitation is not visualized.  6. The inferior vena cava is normal in size with greater than 50% respiratory variability, suggesting right atrial pressure of 3 mmHg.  7. No obvious valvular vegetations noted with significantly limited views. Comparison(s): Prior images reviewed side by side. LVEF remains normal range at 60-65%. FINDINGS  Left Ventricle: Left ventricular ejection fraction, by estimation, is 60 to 65%. The left ventricle has normal function. Left ventricular endocardial border not optimally defined to evaluate regional wall motion. The left ventricular internal cavity size was normal in size. There is no left ventricular hypertrophy. Left ventricular diastolic parameters were normal. Right Ventricle: The right ventricular size is normal. No increase in right ventricular wall thickness. Right ventricular systolic function is normal. Tricuspid regurgitation signal is inadequate for assessing PA pressure. Left Atrium: Left atrial size was mildly dilated. Right Atrium: Right atrial size was normal in size. Pericardium: There is no evidence of pericardial effusion. Presence of epicardial fat layer. Mitral Valve: The mitral valve is grossly normal. Trivial mitral valve regurgitation. Tricuspid Valve: The tricuspid valve is grossly  normal. Tricuspid valve regurgitation is trivial. Aortic Valve: The aortic valve is tricuspid. There is mild aortic valve annular calcification. Aortic valve regurgitation is not visualized. Pulmonic Valve: The pulmonic valve was grossly normal. Pulmonic valve regurgitation is trivial. Aorta: The aortic root is normal in size and structure. Venous: The inferior vena cava is normal in size with greater than 50% respiratory variability, suggesting right atrial pressure of 3 mmHg. IAS/Shunts: No atrial level shunt detected by  color flow Doppler.  LEFT VENTRICLE PLAX 2D LVIDd:         4.40 cm   Diastology LVIDs:         2.80 cm   LV e' medial:    9.25 cm/s LV PW:         0.90 cm   LV E/e' medial:  10.5 LV IVS:        0.70 cm   LV e' lateral:   15.30 cm/s LVOT diam:     1.80 cm   LV E/e' lateral: 6.4 LV SV:         54 LV SV Index:   26 LVOT Area:     2.54 cm  RIGHT VENTRICLE             IVC RV S prime:     13.60 cm/s  IVC diam: 1.40 cm TAPSE (M-mode): 4.0 cm LEFT ATRIUM           Index        RIGHT ATRIUM           Index LA diam:      3.00 cm 1.42 cm/m   RA Area:     10.70 cm LA Vol (A4C): 77.8 ml 36.77 ml/m  RA Volume:   23.60 ml  11.15 ml/m  AORTIC VALVE LVOT Vmax:   131.00 cm/s LVOT Vmean:  84.800 cm/s LVOT VTI:    0.214 m  AORTA Ao Root diam: 2.60 cm MITRAL VALVE MV Area (PHT): 3.93 cm    SHUNTS MV Decel Time: 193 msec    Systemic VTI:  0.21 m MV E velocity: 97.40 cm/s  Systemic Diam: 1.80 cm MV A velocity: 99.00 cm/s MV E/A ratio:  0.98 Rozann Lesches MD Electronically signed by Rozann Lesches MD Signature Date/Time: 11/18/2022/2:31:20 PM    Final    CT CHEST ABDOMEN PELVIS W CONTRAST  Result Date: 11/15/2022 CLINICAL DATA:  Sepsis, polytrauma, fall EXAM: CT CHEST, ABDOMEN, AND PELVIS WITH CONTRAST TECHNIQUE: Multidetector CT imaging of the chest, abdomen and pelvis was performed following the standard protocol during bolus administration of intravenous contrast. RADIATION DOSE REDUCTION: This exam was performed according to the departmental dose-optimization program which includes automated exposure control, adjustment of the mA and/or kV according to patient size and/or use of iterative reconstruction technique. CONTRAST:  174m OMNIPAQUE IOHEXOL 300 MG/ML  SOLN COMPARISON:  CT abdomen pelvis, 09/06/2022 CT chest, 04/15/2022 FINDINGS: CT CHEST FINDINGS Cardiovascular: Right chest port catheter. Aortic atherosclerosis. Normal heart size. Left coronary artery calcifications. No pericardial effusion. Mediastinum/Nodes: No  enlarged mediastinal, hilar, or axillary lymph nodes. Small incidental diverticulum of the posterior right trachea at the level of the thoracic inlet (series 2, image 8). Thyroid gland and esophagus demonstrate no significant findings. Lungs/Pleura: Moderate right pleural effusion and associated atelectasis or consolidation. Musculoskeletal: No chest wall abnormality. No acute osseous findings. Status post bilateral shoulder arthroplasty. CT ABDOMEN PELVIS FINDINGS Hepatobiliary: Coarse contour of the liver. No gallstones, gallbladder wall thickening, or biliary dilatation. Pancreas: Unremarkable. No pancreatic ductal  dilatation or surrounding inflammatory changes. Spleen: Normal in size without significant abnormality. Adrenals/Urinary Tract: Adrenal glands are unremarkable. Kidneys are normal, without renal calculi, solid lesion, or hydronephrosis. Malpositioned Foley catheter, tip and balloon within the prostatic or bulbous urethra (series 5, image 111) Stomach/Bowel: Stomach is within normal limits. 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 Vascular/Lymphatic: Aortic atherosclerosis. 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 No enlarged abdominal or pelvic lymph nodes. Reproductive: No mass or other abnormality. Other: No abdominal wall hernia or abnormality. Small volume perihepatic ascites. Musculoskeletal: No acute osseous findings. 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 IMPRESSION: 1. No CT evidence of acute traumatic injury to the chest, abdomen, or pelvis. 2. Moderate right pleural effusion and associated atelectasis or consolidation. 3. Malpositioned Foley catheter, tip and balloon within the prostatic or bulbous urethra. Recommend repositioning. 4. 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 5. Small volume perihepatic ascites. 6. Coarse contour of the liver and gastric varices, suggestive of cirrhosis. 7. Coronary  artery disease. Aortic Atherosclerosis (ICD10-I70.0). Electronically Signed   By: Delanna Ahmadi M.D.   On: 11/15/2022 14:32   CT Head Wo Contrast  Result Date: 11/15/2022 CLINICAL DATA:  Three falls in the past 24 hours. EXAM: CT HEAD WITHOUT CONTRAST CT CERVICAL SPINE WITHOUT CONTRAST TECHNIQUE: Multidetector CT imaging of the head and cervical spine was performed following the standard protocol without intravenous contrast. Multiplanar CT image reconstructions of the cervical spine were also generated. RADIATION DOSE REDUCTION: This exam was performed according to the departmental dose-optimization program which includes automated exposure control, adjustment of the mA and/or kV according to patient size and/or use of iterative reconstruction technique. COMPARISON:  CT head 05/16/2021 FINDINGS: CT HEAD FINDINGS Brain: There is no acute intracranial hemorrhage, extra-axial fluid collection, or acute infarct. Parenchymal volume is normal. The ventricles are normal in size. Gray-white differentiation is preserved. The pituitary and suprasellar region are normal. There is no mass lesion. There is no mass effect or midline shift. Vascular: No hyperdense vessel or unexpected calcification. Skull: Normal. Negative for fracture or focal lesion. Sinuses/Orbits: There is mild mucosal thickening in the paranasal sinuses. The globes and orbits are unremarkable. Other: None. CT CERVICAL SPINE FINDINGS Alignment: Normal. There is no jumped or perched facet or other evidence of traumatic malalignment. Skull base and vertebrae: Skull base alignment is maintained. Vertebral body heights are preserved. There is no evidence of acute fracture. There is no suspicious osseous lesion. Soft tissues and spinal canal: No prevertebral fluid or swelling. No visible canal hematoma. Disc levels: Facet arthropathy is most advanced on the right at C3-C4. There is overall mild disc space narrowing at C4-C5 and C5-C6. There is no convincing  high-grade spinal canal stenosis. Upper chest: Assessed on the separately dictated CT chest. Other: None. IMPRESSION: 1. No acute intracranial pathology. 2. No acute fracture or traumatic malalignment of the cervical spine. Electronically Signed   By: Valetta Mole M.D.   On: 11/15/2022 14:24   CT Cervical Spine Wo Contrast  Result Date: 11/15/2022 CLINICAL DATA:  Three falls in the past 24 hours. EXAM: CT HEAD WITHOUT CONTRAST CT CERVICAL SPINE WITHOUT CONTRAST TECHNIQUE: Multidetector CT imaging of the head and cervical spine was performed following the standard protocol without intravenous contrast. Multiplanar CT image reconstructions of the cervical spine were also generated. RADIATION DOSE REDUCTION: This exam was performed according to the departmental dose-optimization program which includes automated exposure control, adjustment of the mA  and/or kV according to patient size and/or use of iterative reconstruction technique. COMPARISON:  CT head 05/16/2021 FINDINGS: CT HEAD FINDINGS Brain: There is no acute intracranial hemorrhage, extra-axial fluid collection, or acute infarct. Parenchymal volume is normal. The ventricles are normal in size. Gray-white differentiation is preserved. The pituitary and suprasellar region are normal. There is no mass lesion. There is no mass effect or midline shift. Vascular: No hyperdense vessel or unexpected calcification. Skull: Normal. Negative for fracture or focal lesion. Sinuses/Orbits: There is mild mucosal thickening in the paranasal sinuses. The globes and orbits are unremarkable. Other: None. CT CERVICAL SPINE FINDINGS Alignment: Normal. There is no jumped or perched facet or other evidence of traumatic malalignment. Skull base and vertebrae: Skull base alignment is maintained. Vertebral body heights are preserved. There is no evidence of acute fracture. There is no suspicious osseous lesion. Soft tissues and spinal canal: No prevertebral fluid or swelling. No  visible canal hematoma. Disc levels: Facet arthropathy is most advanced on the right at C3-C4. There is overall mild disc space narrowing at C4-C5 and C5-C6. There is no convincing high-grade spinal canal stenosis. Upper chest: Assessed on the separately dictated CT chest. Other: None. IMPRESSION: 1. No acute intracranial pathology. 2. No acute fracture or traumatic malalignment of the cervical spine. Electronically Signed   By: Valetta Mole M.D.   On: 11/15/2022 14:24   DG Chest Port 1 View  Result Date: 11/15/2022 CLINICAL DATA:  Sepsis, possible UTI EXAM: PORTABLE CHEST 1 VIEW COMPARISON:  CT chest dated 03/16/2022 FINDINGS: Lungs are clear.  No pleural effusion or pneumothorax. The heart is normal in size. Right chest power port terminates at the cavoatrial junction. Bilateral shoulder arthroplasty. IMPRESSION: No evidence of acute cardiopulmonary disease. Electronically Signed   By: Julian Hy M.D.   On: 11/15/2022 13:35    Barton Dubois, MD  [facility]  If 7PM-7AM, please contact night-coverage [email or web address] Password [facility] 11/19/2022, 4:51 PM   LOS: 3 days

## 2022-11-20 DIAGNOSIS — E876 Hypokalemia: Secondary | ICD-10-CM | POA: Diagnosis not present

## 2022-11-20 DIAGNOSIS — Z86718 Personal history of other venous thrombosis and embolism: Secondary | ICD-10-CM | POA: Diagnosis not present

## 2022-11-20 DIAGNOSIS — K50819 Crohn's disease of both small and large intestine with unspecified complications: Secondary | ICD-10-CM | POA: Diagnosis not present

## 2022-11-20 DIAGNOSIS — J90 Pleural effusion, not elsewhere classified: Secondary | ICD-10-CM | POA: Diagnosis not present

## 2022-11-20 LAB — BASIC METABOLIC PANEL
Anion gap: 8 (ref 5–15)
BUN: 22 mg/dL — ABNORMAL HIGH (ref 6–20)
CO2: 22 mmol/L (ref 22–32)
Calcium: 7.8 mg/dL — ABNORMAL LOW (ref 8.9–10.3)
Chloride: 101 mmol/L (ref 98–111)
Creatinine, Ser: 1.11 mg/dL (ref 0.61–1.24)
GFR, Estimated: >60 mL/min (ref >60)
Glucose, Bld: 68 mg/dL — ABNORMAL LOW (ref 70–99)
Potassium: 4.8 mmol/L (ref 3.5–5.1)
Sodium: 131 mmol/L — ABNORMAL LOW (ref 135–145)

## 2022-11-20 LAB — CBC
HCT: 26.3 % — ABNORMAL LOW (ref 39.0–52.0)
Hemoglobin: 7.9 g/dL — ABNORMAL LOW (ref 13.0–17.0)
MCH: 33.5 pg (ref 26.0–34.0)
MCHC: 30 g/dL (ref 30.0–36.0)
MCV: 111.4 fL — ABNORMAL HIGH (ref 80.0–100.0)
Platelets: 79 10*3/uL — ABNORMAL LOW (ref 150–400)
RBC: 2.36 MIL/uL — ABNORMAL LOW (ref 4.22–5.81)
RDW: 18.2 % — ABNORMAL HIGH (ref 11.5–15.5)
WBC: 5.7 10*3/uL (ref 4.0–10.5)
nRBC: 0 % (ref 0.0–0.2)

## 2022-11-20 LAB — CULTURE, BLOOD (ROUTINE X 2): Culture: NO GROWTH

## 2022-11-20 LAB — GLUCOSE, CAPILLARY
Glucose-Capillary: 73 mg/dL (ref 70–99)
Glucose-Capillary: 72 mg/dL (ref 70–99)
Glucose-Capillary: 79 mg/dL (ref 70–99)
Glucose-Capillary: 73 mg/dL (ref 70–99)

## 2022-11-20 MED ORDER — APIXABAN 5 MG PO TABS
5.0000 mg | ORAL_TABLET | Freq: Two times a day (BID) | ORAL | Status: DC
  Administered 2022-11-20 – 2022-11-25 (×10): 5 mg via ORAL
  Filled 2022-11-20 (×10): qty 1

## 2022-11-20 NOTE — Progress Notes (Signed)
Patient lost IV access. Ultrasound guided IV unsuccessful. Patient could possibly benefit from a PICC line.

## 2022-11-20 NOTE — Plan of Care (Signed)
  Problem: Education: Goal: Knowledge of General Education information will improve Description Including pain rating scale, medication(s)/side effects and non-pharmacologic comfort measures Outcome: Progressing   Problem: Health Behavior/Discharge Planning: Goal: Ability to manage health-related needs will improve Outcome: Progressing   

## 2022-11-20 NOTE — Progress Notes (Signed)
PROGRESS NOTE  [name] [id number] DOB: 03-Jul-1966 DOA: 11/15/2022 PCP: [facility]  Brief History:  57 year old male with a history of diabetes mellitus type 2, pancytopenia, recurrent DVT/PE on Eliquis, RA, Crohn's colitis status post colectomy with ileostomy 2009 presenting with multiple falls.  The patient is a poor historian. Patient was seen on 05 November 2022 by liver transplant at [facility].  He was noted to have nonalcoholic fatty liver disease along with cirrhosis.  Autoimmune workup was started.  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  Has not had follow-up yet.   Patient been taking Demadex 20 mg a day prescribed by his cardiologist due to lower extremity edema.  Patient has not been started on Aldactone yet. This was mentioned during his hepatology consult. Patient states that he is taking Aldactone but there is no recording of this in his chart.   The patient was recently hospitalized from 09/06/2022 to 09/09/2022 for GI bleed.  EGD  09/07/22 with 1cm hh, Erythematous mucosa in the gastric fundus. Treated w/ (APC).GAVE w/ bleeding. Treated w/ (APC). Erythematous duodenopathy.  The patient was discharged home with pantoprazole twice daily.  In the ED, the patient was noted to be tachycardic in the 110s.  He was hypotensive with systolic blood pressure 83.  The patient was given 3.5 L of fluid.  Lactic acid was 3.7.  He was admitted for further evaluation and treatment of SIRS and altered mentation    Assessment/Plan: Frequent falls/failure to thrive -TSH, B12, folate and ammonia within normal limits -Continue supportive care -Continue to follow clinical response. -Physical therapy has recommended skilled nursing facility for rehab at discharge -[facility] assisting with discharge plans.  Generalized weakness -Multifactorial including deconditioning, hypokalemia, possible infectious process. -Following recommendations by  infectious disease service will treat for 2 weeks using oral antibiotics (ciprofloxacin and Zyvox). -Follow physical therapy evaluation/recommendations. -Currently with recommendation for a skilled nursing facility at discharge once medically stable.  Urinary retention -Foley catheter placed in the ED -CT shows tip of the catheter is in the prostatic urethra -Requested nursing to advance/adjust foley -Continue monitoring urine output -Patient denying dysuria and suprapubic tenderness at this moment. -Patient will benefit of outpatient follow-up with urology service; -plan is for patient to be discharged with Foley catheter in place..  Acute metabolic encephalopathy -Patient mentation slowly improving. -Ammonia 30 -CT brain negative -UA negative for pyuria -Positive bacteremia as mentioned below -Continue current antibiotics.  Lactic acidosis/enterococcal and Acinetobacter bacteremia -Suspect this is due to decreased clearance from his hepatic cirrhosis, versus associated with active infection. -Following ID recommendations 2D echo performed; no vegetations appreciated.  Holding on TEE at this moment -Based on cultures sensitivity antibiotics will be narrow to Cipro and Zyvox with plans for 2 weeks of therapy.  Hypokalemia -Repleted and is stable -Magnesium within normal limits -Continue to follow electrolytes trend.  Mild hyponatremia -Most likely associated with chronic cirrhosis and dehydration -Improved and pretty much back to baseline after fluid resuscitation provided -Continue to follow electrolytes trend.  Liver cirrhosis secondary to nonalcoholic steatohepatitis (NASH)  -Pt was seen by transplant hepatology at [facility] on 11-05-2022. He has outpatient labs that showed + ANA and + anti-smooth muscle antibody.  -This calls into question whether his cirrhosis is caused by autoimmune hepatitis. -Continue outpatient follow-up with GI service -LFTs appears to be stable. -Continue  holding Aldactone and Demadex for now.  Crohn's disease of both small  and large intestine with complication ([place]) -Pt with ileostomy.  -Pt denies any high output from ileostomy -Continue to maintain adequate hydration.  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  Bipolar -continue depakote, risperdal, sertraline, benztropine -Overall mood is stable currently.  Mixed Hyperlipidemia -continue statin -Heart healthy diet discussed with patient.  History DVTs/PE -Resume use of Eliquis for secondary prevention.  Pancytopenia -chronic. -Continue patient follow-up with hematology service. -Continue close monitoring of patient's hemoglobin and platelet count. -Hemoglobin has remained stable.  Complaining of delusional effect following fluid resuscitation most likely playing a role in decreased hemoglobin.  Class 2 Obese -BMI 37.04 -Low-calorie diet and portion control discussed with patient.    Family Communication:   None family at bedside.  Consultants: Infectious disease.  Code Status:  FULL   DVT Prophylaxis:  SCDs   Procedures: As Listed in Progress Note Above  Antibiotics: Meropenem  Subjective: Weak, tired and deconditioned; no chest pain, no nausea, no vomiting, no shortness of breath.  No overt bleeding appreciated overnight.  Tolerating oral antibiotics.   Objective: Vitals:   11/20/22 0445 11/20/22 0451 11/20/22 0849 11/20/22 1226  BP: 92/74  (!) 106/46 (!) 110/52  Pulse: 80  80 79  Resp: 18   18  Temp: 98.2 F (36.8 C)   98.4 F (36.9 C)  TempSrc: Oral   Oral  SpO2: 91%   92%  Weight:  103.9 kg    Height:        Intake/Output Summary (Last 24 hours) at 11/20/2022 1409 Last data filed at 11/20/2022 0433 Gross per 24 hour  Intake 240 ml  Output 1100 ml  Net -860 ml   Weight change: -2.8 kg  Exam: General exam: Alert, awake, oriented x 3; no overnight  events.  No acute distress.  No overt bleeding. Respiratory system: Clear to auscultation. Respiratory effort normal.  Good saturation on room air.  No using accessory muscles. Cardiovascular system:RRR. No rubs or gallops; no JVD appreciated on exam. Gastrointestinal system: Abdomen is obese, nondistended, soft and nontender. No organomegaly or masses felt. Normal bowel sounds heard. Central nervous system: Alert and oriented. No focal neurological deficits. GU: Foley catheter in place. Extremities: No cyanosis or clubbing; 1-2+ edema appreciated bilaterally. Skin: No petechiae, chronic stasis dermatitis appreciated bilaterally. Psychiatry: Flat affect; following commands appropriately.  Data Reviewed: I have personally reviewed following labs and imaging studies  Basic Metabolic Panel: Recent Labs  Lab 11/15/22 1223 11/15/22 1254 11/16/22 0417 11/17/22 0412 11/18/22 0408 11/20/22 0517  NA 132* 136 134* 133* 131* 131*  K 2.6* 2.8* 4.4 4.8 4.5 4.8  CL 99 97* 105 106 104 101  CO2 25  --  23 24 24 22  $ GLUCOSE 95 91 67* 77 87 68*  BUN 11 10 10 14 16 $ 22*  CREATININE 1.15 1.20 0.95 1.02 0.93 1.11  CALCIUM 8.3*  --  7.9* 7.7* 7.9* 7.8*  MG 1.8  --  1.8 2.0  --   --    Liver Function Tests: Recent Labs  Lab 11/15/22 1223 11/16/22 0417  AST 44* 43*  ALT 19 18  ALKPHOS 98 87  BILITOT 1.1 1.4*  PROT 5.5* 5.0*  ALBUMIN 1.9* 1.8*    Recent Labs  Lab 11/15/22 1905 11/17/22 0412  AMMONIA 30 66*   CBC: Recent Labs  Lab 11/15/22 1223 11/15/22 1254 11/16/22 0417 11/17/22 0412 11/18/22 0408 11/20/22 0517  WBC 2.3*  --  3.6* 3.8* 4.4 5.7  NEUTROABS 1.2*  --  2.4  --   --   --   HGB 8.7* 9.2* 7.8* 7.2* 7.4* 7.9*  HCT 27.0* 27.0* 24.6* 22.9* 22.7* 26.3*  MCV 102.7*  --  102.5* 104.6* 103.7* 111.4*  PLT 46*  --  46* 47* 59* 79*   Cardiac Enzymes: Recent Labs  Lab 11/16/22 1201  CKTOTAL 215   CBG: Recent Labs  Lab 11/19/22 1153 11/19/22 1658 11/19/22 2058  11/20/22 0729 11/20/22 1103  GLUCAP 106* 103* 124* 73 72   Urine analysis:    Component Value Date/Time   COLORURINE YELLOW 11/15/2022 1327   APPEARANCEUR CLEAR 11/15/2022 1327   APPEARANCEUR Cloudy (A) 08/25/2022 1417   LABSPEC 1.012 11/15/2022 1327   PHURINE 6.0 11/15/2022 1327   GLUCOSEU NEGATIVE 11/15/2022 1327   HGBUR NEGATIVE 11/15/2022 1327   BILIRUBINUR NEGATIVE 11/15/2022 1327   BILIRUBINUR Negative 08/25/2022 1417   KETONESUR NEGATIVE 11/15/2022 1327   PROTEINUR NEGATIVE 11/15/2022 1327   UROBILINOGEN 0.2 10/17/2014 1940   NITRITE NEGATIVE 11/15/2022 1327   LEUKOCYTESUR NEGATIVE 11/15/2022 1327   Sepsis Labs:  Recent Results (from the past 240 hour(s))  Blood Culture (routine x 2)     Status: Abnormal   Collection Time: 11/15/22 12:23 PM   Specimen: BLOOD RIGHT HAND  Result Value Ref Range Status   Specimen Description   Final    BLOOD RIGHT HAND Performed at [facility], [address] [place], [place] [address]    Special Requests   Final    BOTTLES DRAWN AEROBIC AND ANAEROBIC Blood Culture adequate volume Performed at [facility], [address], [place], [place] [address]    Culture  Setup Time   Final    GRAM POSITIVE COCCI BOTTLES DRAWN AEROBIC AND ANAEROBIC Gram Stain Report Called to,Read Back By and Verified With: [email or web address],[email or web address]$  BY MATTHEWS B 2.13.2024 CRITICAL RESULT CALLED TO, READ BACK BY AND VERIFIED WITH: PHARMD FRANK WILSON  ON 11/16/22 @ 1444 BY DRT Performed at [facility], [place] [address], [place],  [address]    Culture (A)  Final    ACINETOBACTER CALCOACETICUS/BAUMANNII COMPLEX ENTEROCOCCUS FAECALIS    Report Status 11/18/2022 FINAL  Final   Organism ID, Bacteria ACINETOBACTER CALCOACETICUS/BAUMANNII COMPLEX  Final   Organism ID, Bacteria ENTEROCOCCUS FAECALIS  Final      Susceptibility   Acinetobacter calcoaceticus/baumannii complex - MIC*    CEFTAZIDIME 8 SENSITIVE Sensitive     CIPROFLOXACIN 1 SENSITIVE Sensitive      GENTAMICIN 8 INTERMEDIATE Intermediate     IMIPENEM <=0.25 SENSITIVE Sensitive     PIP/TAZO <=4 SENSITIVE Sensitive     TRIMETH/SULFA <=20 SENSITIVE Sensitive     AMPICILLIN/SULBACTAM <=2 SENSITIVE Sensitive     * ACINETOBACTER CALCOACETICUS/BAUMANNII COMPLEX   Enterococcus faecalis - MIC*    AMPICILLIN <=2 SENSITIVE Sensitive     VANCOMYCIN 1 SENSITIVE Sensitive     GENTAMICIN SYNERGY RESISTANT Resistant     * ENTEROCOCCUS FAECALIS  Blood Culture ID Panel (Reflexed)     Status: Abnormal   Collection Time: 11/15/22 12:23 PM  Result Value Ref Range Status   Enterococcus faecalis NOT DETECTED NOT DETECTED Final   Enterococcus Faecium NOT DETECTED NOT DETECTED Final   Listeria monocytogenes NOT DETECTED NOT DETECTED Final   Staphylococcus species NOT DETECTED NOT DETECTED Final   Staphylococcus aureus (BCID) NOT DETECTED NOT DETECTED Final   Staphylococcus epidermidis NOT DETECTED NOT DETECTED Final   Staphylococcus lugdunensis NOT DETECTED NOT DETECTED  Final   Streptococcus species NOT DETECTED NOT DETECTED Final   Streptococcus agalactiae NOT DETECTED NOT DETECTED Final   Streptococcus pneumoniae NOT DETECTED NOT DETECTED Final   Streptococcus pyogenes NOT DETECTED NOT DETECTED Final   A.calcoaceticus-baumannii DETECTED (A) NOT DETECTED Final    Comment: CRITICAL RESULT CALLED TO, READ BACK BY AND VERIFIED WITH: Palmer  ON 11/16/22 @ 1444 BY DRT    Bacteroides fragilis NOT DETECTED NOT DETECTED Final   Enterobacterales NOT DETECTED NOT DETECTED Final   Enterobacter cloacae complex NOT DETECTED NOT DETECTED Final   Escherichia coli NOT DETECTED NOT DETECTED Final   Klebsiella aerogenes NOT DETECTED NOT DETECTED Final   Klebsiella oxytoca NOT DETECTED NOT DETECTED Final   Klebsiella pneumoniae NOT DETECTED NOT DETECTED Final   Proteus species NOT DETECTED NOT DETECTED Final   Salmonella species NOT DETECTED NOT DETECTED Final   Serratia marcescens NOT DETECTED NOT  DETECTED Final   Haemophilus influenzae NOT DETECTED NOT DETECTED Final   Neisseria meningitidis NOT DETECTED NOT DETECTED Final   Pseudomonas aeruginosa NOT DETECTED NOT DETECTED Final   Stenotrophomonas maltophilia NOT DETECTED NOT DETECTED Final   Candida albicans NOT DETECTED NOT DETECTED Final   Candida auris NOT DETECTED NOT DETECTED Final   Candida glabrata NOT DETECTED NOT DETECTED Final   Candida krusei NOT DETECTED NOT DETECTED Final   Candida parapsilosis NOT DETECTED NOT DETECTED Final   Candida tropicalis NOT DETECTED NOT DETECTED Final   Cryptococcus neoformans/gattii NOT DETECTED NOT DETECTED Final   CTX-M ESBL NOT DETECTED NOT DETECTED Final   Carbapenem resistance IMP NOT DETECTED NOT DETECTED Final   Carbapenem resistance KPC NOT DETECTED NOT DETECTED Final   Carbapenem resistance NDM NOT DETECTED NOT DETECTED Final   Carbapenem resistance VIM NOT DETECTED NOT DETECTED Final    Comment: Performed at [facility], [address], [place], [place] [address]  Blood Culture (routine x 2)     Status: None   Collection Time: 11/15/22 12:28 PM   Specimen: BLOOD LEFT HAND  Result Value Ref Range Status   Specimen Description BLOOD LEFT HAND  Final   Special Requests   Final    BOTTLES DRAWN AEROBIC ONLY Blood Culture results may not be optimal due to an inadequate volume of blood received in culture bottles   Culture   Final    NO GROWTH 5 DAYS Performed at [facility], [address] [place], [place] [address]    Report Status 11/20/2022 FINAL  Final  Resp panel by RT-PCR (RSV, Flu A&B, Covid) Anterior Nasal Swab     Status: None   Collection Time: 11/15/22  1:28 PM   Specimen: Anterior Nasal Swab  Result Value Ref Range Status   SARS Coronavirus 2 by RT PCR NEGATIVE NEGATIVE Final    Comment: (NOTE) SARS-CoV-2 target nucleic acids are NOT DETECTED.  The SARS-CoV-2 RNA is generally detectable in upper respiratory specimens during the acute phase of infection.  The lowest concentration of SARS-CoV-2 viral copies this assay can detect is 138 copies/mL. A negative result does not preclude SARS-Cov-2 infection and should not be used as the sole basis for treatment or other patient management decisions. A negative result may occur with  improper specimen collection/handling, submission of specimen other than nasopharyngeal swab, presence of viral mutation(s) within the areas targeted by this assay, and inadequate number of viral copies(<138 copies/mL). A negative result must be combined with clinical observations, patient history, and epidemiological information. The expected result  is Negative.  Fact Sheet for Patients:  EntrepreneurPulse.com.au  Fact Sheet for Healthcare Providers:  IncredibleEmployment.be  This test is no t yet approved or cleared by the [place] FDA and  has been authorized for detection and/or diagnosis of SARS-CoV-2 by FDA under an Emergency Use Authorization (EUA). This EUA will remain  in effect (meaning this test can be used) for the duration of the COVID-19 declaration under Section 564(b)(1) of the Act, 21 U.S.C.section 360bbb-3(b)(1), unless the authorization is terminated  or revoked sooner.       Influenza A by PCR NEGATIVE NEGATIVE Final   Influenza B by PCR NEGATIVE NEGATIVE Final    Comment: (NOTE) The Xpert Xpress SARS-CoV-2/FLU/RSV plus assay is intended as an aid in the diagnosis of influenza from Nasopharyngeal swab specimens and should not be used as a sole basis for treatment. Nasal washings and aspirates are unacceptable for Xpert Xpress SARS-CoV-2/FLU/RSV testing.  Fact Sheet for Patients: EntrepreneurPulse.com.au  Fact Sheet for Healthcare Providers: IncredibleEmployment.be  This test is not yet approved or cleared by the [place] FDA and has been authorized for detection and/or diagnosis of SARS-CoV-2 by FDA  under an Emergency Use Authorization (EUA). This EUA will remain in effect (meaning this test can be used) for the duration of the COVID-19 declaration under Section 564(b)(1) of the Act, 21 U.S.C. section 360bbb-3(b)(1), unless the authorization is terminated or revoked.     Resp Syncytial Virus by PCR NEGATIVE NEGATIVE Final    Comment: (NOTE) Fact Sheet for Patients: EntrepreneurPulse.com.au  Fact Sheet for Healthcare Providers: IncredibleEmployment.be  This test is not yet approved or cleared by the [place] FDA and has been authorized for detection and/or diagnosis of SARS-CoV-2 by FDA under an Emergency Use Authorization (EUA). This EUA will remain in effect (meaning this test can be used) for the duration of the COVID-19 declaration under Section 564(b)(1) of the Act, 21 U.S.C. section 360bbb-3(b)(1), unless the authorization is terminated or revoked.  Performed at [facility], [address], [place], [place] [address]   Culture, blood (Routine X 2) w Reflex to ID Panel     Status: None (Preliminary result)   Collection Time: 11/17/22  4:52 PM   Specimen: BLOOD RIGHT HAND  Result Value Ref Range Status   Specimen Description BLOOD RIGHT HAND  Final   Special Requests   Final    BOTTLES DRAWN AEROBIC AND ANAEROBIC Blood Culture results may not be optimal due to an inadequate volume of blood received in culture bottles   Culture   Final    NO GROWTH 3 DAYS Performed at [facility], [address] [place], [place] [address]    Report Status PENDING  Incomplete  Culture, blood (Routine X 2) w Reflex to ID Panel     Status: None (Preliminary result)   Collection Time: 11/17/22  6:30 PM   Specimen: BLOOD LEFT HAND  Result Value Ref Range Status   Specimen Description BLOOD LEFT HAND  Final   Special Requests   Final    BOTTLES DRAWN AEROBIC ONLY Blood Culture adequate volume   Culture   Final    NO GROWTH 3 DAYS Performed at  [facility], [address] [place], [place] [address]    Report Status PENDING  Incomplete     Scheduled Meds:  atorvastatin  80 mg Oral Daily   benztropine  0.5 mg Oral Daily   ciprofloxacin  500 mg Oral BID   divalproex  1,000 mg Oral QHS  divalproex  250 mg Oral Daily   hydroxychloroquine  200 mg Oral BID   insulin aspart  0-5 Units Subcutaneous QHS   insulin aspart  0-9 Units Subcutaneous TID WC   linezolid  600 mg Oral Q12H   metoprolol succinate  25 mg Oral Daily   pantoprazole  40 mg Oral BID   risperiDONE  1 mg Oral QHS   risperiDONE  6 mg Oral QHS   sertraline  50 mg Oral Daily   traZODone  100 mg Oral QHS    Procedures/Studies: ECHOCARDIOGRAM COMPLETE  Result Date: 11/18/2022    ECHOCARDIOGRAM REPORT   Patient Name:   YISHAI FAVRE Date of Exam: 11/18/2022 Medical Rec #:  [id number]      Height:       66.0 in Accession #:    [id number]     Weight:       228.4 lb Date of Birth:  [birth year]     BSA:          2.116 m Patient Age:    47 years       BP:           105/56 mmHg Patient Gender: M              HR:           91 bpm. Exam Location:  [facility] Procedure: 2D Echo, Cardiac Doppler and Color Doppler Indications:    Bacteremia  History:        Patient has prior history of Echocardiogram examinations, most                 recent 11/13/2020. Cirrhosis; Risk Factors:Diabetes, Current                 Smoker and Dyslipidemia.  Sonographer:    Johny Chess RDCS Referring Phys: [phone] Marquay Kruse  Sonographer Comments: Technically difficult study due to poor echo windows. Image acquisition challenging due to respiratory motion. IMPRESSIONS  1. Left ventricular ejection fraction, by estimation, is 60 to 65%. The left ventricle has normal function. Left ventricular endocardial border not optimally defined to evaluate regional wall motion. Left ventricular diastolic parameters were normal.  2. Right ventricular systolic function is normal. The right ventricular size is normal.  Tricuspid regurgitation signal is inadequate for assessing PA pressure.  3. Left atrial size was mildly dilated.  4. The mitral valve is grossly normal. Trivial mitral valve regurgitation.  5. The aortic valve is tricuspid. Aortic valve regurgitation is not visualized.  6. The inferior vena cava is normal in size with greater than 50% respiratory variability, suggesting right atrial pressure of 3 mmHg.  7. No obvious valvular vegetations noted with significantly limited views. Comparison(s): Prior images reviewed side by side. LVEF remains normal range at 60-65%. FINDINGS  Left Ventricle: Left ventricular ejection fraction, by estimation, is 60 to 65%. The left ventricle has normal function. Left ventricular endocardial border not optimally defined to evaluate regional wall motion. The left ventricular internal cavity size was normal in size. There is no left ventricular hypertrophy. Left ventricular diastolic parameters were normal. Right Ventricle: The right ventricular size is normal. No increase in right ventricular wall thickness. Right ventricular systolic function is normal. Tricuspid regurgitation signal is inadequate for assessing PA pressure. Left Atrium: Left atrial size was mildly dilated. Right Atrium: Right atrial size was normal in size. Pericardium: There is no evidence of pericardial effusion. Presence of epicardial fat layer. Mitral Valve: The mitral valve is  grossly normal. Trivial mitral valve regurgitation. Tricuspid Valve: The tricuspid valve is grossly normal. Tricuspid valve regurgitation is trivial. Aortic Valve: The aortic valve is tricuspid. There is mild aortic valve annular calcification. Aortic valve regurgitation is not visualized. Pulmonic Valve: The pulmonic valve was grossly normal. Pulmonic valve regurgitation is trivial. Aorta: The aortic root is normal in size and structure. Venous: The inferior vena cava is normal in size with greater than 50% respiratory variability, suggesting  right atrial pressure of 3 mmHg. IAS/Shunts: No atrial level shunt detected by color flow Doppler.  LEFT VENTRICLE PLAX 2D LVIDd:         4.40 cm   Diastology LVIDs:         2.80 cm   LV e' medial:    9.25 cm/s LV PW:         0.90 cm   LV E/e' medial:  10.5 LV IVS:        0.70 cm   LV e' lateral:   15.30 cm/s LVOT diam:     1.80 cm   LV E/e' lateral: 6.4 LV SV:         54 LV SV Index:   26 LVOT Area:     2.54 cm  RIGHT VENTRICLE             IVC RV S prime:     13.60 cm/s  IVC diam: 1.40 cm TAPSE (M-mode): 4.0 cm LEFT ATRIUM           Index        RIGHT ATRIUM           Index LA diam:      3.00 cm 1.42 cm/m   RA Area:     10.70 cm LA Vol (A4C): 77.8 ml 36.77 ml/m  RA Volume:   23.60 ml  11.15 ml/m  AORTIC VALVE LVOT Vmax:   131.00 cm/s LVOT Vmean:  84.800 cm/s LVOT VTI:    0.214 m  AORTA Ao Root diam: 2.60 cm MITRAL VALVE MV Area (PHT): 3.93 cm    SHUNTS MV Decel Time: 193 msec    Systemic VTI:  0.21 m MV E velocity: 97.40 cm/s  Systemic Diam: 1.80 cm MV A velocity: 99.00 cm/s MV E/A ratio:  0.98 Rozann Lesches MD Electronically signed by Rozann Lesches MD Signature Date/Time: 11/18/2022/2:31:20 PM    Final    CT CHEST ABDOMEN PELVIS W CONTRAST  Result Date: 11/15/2022 CLINICAL DATA:  Sepsis, polytrauma, fall EXAM: CT CHEST, ABDOMEN, AND PELVIS WITH CONTRAST TECHNIQUE: Multidetector CT imaging of the chest, abdomen and pelvis was performed following the standard protocol during bolus administration of intravenous contrast. RADIATION DOSE REDUCTION: This exam was performed according to the departmental dose-optimization program which includes automated exposure control, adjustment of the mA and/or kV according to patient size and/or use of iterative reconstruction technique. CONTRAST:  182m OMNIPAQUE IOHEXOL 300 MG/ML  SOLN COMPARISON:  CT abdomen pelvis, 09/06/2022 CT chest, 04/15/2022 FINDINGS: CT CHEST FINDINGS Cardiovascular: Right chest port catheter. Aortic atherosclerosis. Normal heart size. Left  coronary artery calcifications. No pericardial effusion. Mediastinum/Nodes: No enlarged mediastinal, hilar, or axillary lymph nodes. Small incidental diverticulum of the posterior right trachea at the level of the thoracic inlet (series 2, image 8). Thyroid gland and esophagus demonstrate no significant findings. Lungs/Pleura: Moderate right pleural effusion and associated atelectasis or consolidation. Musculoskeletal: No chest wall abnormality. No acute osseous findings. Status post bilateral shoulder arthroplasty. CT ABDOMEN PELVIS FINDINGS Hepatobiliary: Coarse contour of the liver.  No gallstones, gallbladder wall thickening, or biliary dilatation. Pancreas: Unremarkable. No pancreatic ductal dilatation or surrounding inflammatory changes. Spleen: Normal in size without significant abnormality. Adrenals/Urinary Tract: Adrenal glands are unremarkable. Kidneys are normal, without renal calculi, solid lesion, or hydronephrosis. Malpositioned Foley catheter, tip and balloon within the prostatic or bulbous urethra (series 5, image 111) Stomach/Bowel: Stomach is within normal limits. 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 Vascular/Lymphatic: Aortic atherosclerosis. 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 No enlarged abdominal or pelvic lymph nodes. Reproductive: No mass or other abnormality. Other: No abdominal wall hernia or abnormality. Small volume perihepatic ascites. Musculoskeletal: No acute osseous findings. 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 IMPRESSION: 1. No CT evidence of acute traumatic injury to the chest, abdomen, or pelvis. 2. Moderate right pleural effusion and associated atelectasis or consolidation. 3. Malpositioned Foley catheter, tip and balloon within the prostatic or bulbous urethra. Recommend repositioning. 4. 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 5. Small volume perihepatic ascites. 6. Coarse  contour of the liver and gastric varices, suggestive of cirrhosis. 7. Coronary artery disease. Aortic Atherosclerosis (ICD10-I70.0). Electronically Signed   By: Delanna Ahmadi M.D.   On: 11/15/2022 14:32   CT Head Wo Contrast  Result Date: 11/15/2022 CLINICAL DATA:  Three falls in the past 24 hours. EXAM: CT HEAD WITHOUT CONTRAST CT CERVICAL SPINE WITHOUT CONTRAST TECHNIQUE: Multidetector CT imaging of the head and cervical spine was performed following the standard protocol without intravenous contrast. Multiplanar CT image reconstructions of the cervical spine were also generated. RADIATION DOSE REDUCTION: This exam was performed according to the departmental dose-optimization program which includes automated exposure control, adjustment of the mA and/or kV according to patient size and/or use of iterative reconstruction technique. COMPARISON:  CT head 05/16/2021 FINDINGS: CT HEAD FINDINGS Brain: There is no acute intracranial hemorrhage, extra-axial fluid collection, or acute infarct. Parenchymal volume is normal. The ventricles are normal in size. Gray-white differentiation is preserved. The pituitary and suprasellar region are normal. There is no mass lesion. There is no mass effect or midline shift. Vascular: No hyperdense vessel or unexpected calcification. Skull: Normal. Negative for fracture or focal lesion. Sinuses/Orbits: There is mild mucosal thickening in the paranasal sinuses. The globes and orbits are unremarkable. Other: None. CT CERVICAL SPINE FINDINGS Alignment: Normal. There is no jumped or perched facet or other evidence of traumatic malalignment. Skull base and vertebrae: Skull base alignment is maintained. Vertebral body heights are preserved. There is no evidence of acute fracture. There is no suspicious osseous lesion. Soft tissues and spinal canal: No prevertebral fluid or swelling. No visible canal hematoma. Disc levels: Facet arthropathy is most advanced on the right at C3-C4. There is  overall mild disc space narrowing at C4-C5 and C5-C6. There is no convincing high-grade spinal canal stenosis. Upper chest: Assessed on the separately dictated CT chest. Other: None. IMPRESSION: 1. No acute intracranial pathology. 2. No acute fracture or traumatic malalignment of the cervical spine. Electronically Signed   By: Valetta Mole M.D.   On: 11/15/2022 14:24   CT Cervical Spine Wo Contrast  Result Date: 11/15/2022 CLINICAL DATA:  Three falls in the past 24 hours. EXAM: CT HEAD WITHOUT CONTRAST CT CERVICAL SPINE WITHOUT CONTRAST TECHNIQUE: Multidetector CT imaging of the head and cervical spine was performed following the standard protocol without intravenous contrast. Multiplanar CT image reconstructions of the cervical spine were also generated. RADIATION DOSE REDUCTION: This exam was performed according to  the departmental dose-optimization program which includes automated exposure control, adjustment of the mA and/or kV according to patient size and/or use of iterative reconstruction technique. COMPARISON:  CT head 05/16/2021 FINDINGS: CT HEAD FINDINGS Brain: There is no acute intracranial hemorrhage, extra-axial fluid collection, or acute infarct. Parenchymal volume is normal. The ventricles are normal in size. Gray-white differentiation is preserved. The pituitary and suprasellar region are normal. There is no mass lesion. There is no mass effect or midline shift. Vascular: No hyperdense vessel or unexpected calcification. Skull: Normal. Negative for fracture or focal lesion. Sinuses/Orbits: There is mild mucosal thickening in the paranasal sinuses. The globes and orbits are unremarkable. Other: None. CT CERVICAL SPINE FINDINGS Alignment: Normal. There is no jumped or perched facet or other evidence of traumatic malalignment. Skull base and vertebrae: Skull base alignment is maintained. Vertebral body heights are preserved. There is no evidence of acute fracture. There is no suspicious osseous  lesion. Soft tissues and spinal canal: No prevertebral fluid or swelling. No visible canal hematoma. Disc levels: Facet arthropathy is most advanced on the right at C3-C4. There is overall mild disc space narrowing at C4-C5 and C5-C6. There is no convincing high-grade spinal canal stenosis. Upper chest: Assessed on the separately dictated CT chest. Other: None. IMPRESSION: 1. No acute intracranial pathology. 2. No acute fracture or traumatic malalignment of the cervical spine. Electronically Signed   By: Valetta Mole M.D.   On: 11/15/2022 14:24   DG Chest Port 1 View  Result Date: 11/15/2022 CLINICAL DATA:  Sepsis, possible UTI EXAM: PORTABLE CHEST 1 VIEW COMPARISON:  CT chest dated 03/16/2022 FINDINGS: Lungs are clear.  No pleural effusion or pneumothorax. The heart is normal in size. Right chest power port terminates at the cavoatrial junction. Bilateral shoulder arthroplasty. IMPRESSION: No evidence of acute cardiopulmonary disease. Electronically Signed   By: Julian Hy M.D.   On: 11/15/2022 13:35    Barton Dubois, MD  [facility]  If 7PM-7AM, please contact night-coverage [email or web address] Password [email or web address] 11/20/2022, 2:09 PM   LOS: 4 days

## 2022-11-21 DIAGNOSIS — Z86718 Personal history of other venous thrombosis and embolism: Secondary | ICD-10-CM | POA: Diagnosis not present

## 2022-11-21 DIAGNOSIS — E876 Hypokalemia: Secondary | ICD-10-CM | POA: Diagnosis not present

## 2022-11-21 DIAGNOSIS — J90 Pleural effusion, not elsewhere classified: Secondary | ICD-10-CM | POA: Diagnosis not present

## 2022-11-21 DIAGNOSIS — K50819 Crohn's disease of both small and large intestine with unspecified complications: Secondary | ICD-10-CM | POA: Diagnosis not present

## 2022-11-21 LAB — GLUCOSE, CAPILLARY
Glucose-Capillary: 60 mg/dL — ABNORMAL LOW (ref 70–99)
Glucose-Capillary: 74 mg/dL (ref 70–99)
Glucose-Capillary: 67 mg/dL — ABNORMAL LOW (ref 70–99)
Glucose-Capillary: 111 mg/dL — ABNORMAL HIGH (ref 70–99)
Glucose-Capillary: 103 mg/dL — ABNORMAL HIGH (ref 70–99)
Glucose-Capillary: 98 mg/dL (ref 70–99)

## 2022-11-21 NOTE — Progress Notes (Signed)
PROGRESS NOTE  [name] [id number] DOB: 12-Dec-1965 DOA: 11/15/2022 PCP: [facility]  Brief History:  57 year old male with a history of diabetes mellitus type 2, pancytopenia, recurrent DVT/PE on Eliquis, RA, Crohn's colitis status post colectomy with ileostomy 2009 presenting with multiple falls.  The patient is a poor historian. Patient was seen on 05 November 2022 by liver transplant at [facility].  He was noted to have nonalcoholic fatty liver disease along with cirrhosis.  Autoimmune workup was started.  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  Has not had follow-up yet.   Patient been taking Demadex 20 mg a day prescribed by his cardiologist due to lower extremity edema.  Patient has not been started on Aldactone yet. This was mentioned during his hepatology consult. Patient states that he is taking Aldactone but there is no recording of this in his chart.   The patient was recently hospitalized from 09/06/2022 to 09/09/2022 for GI bleed.  EGD  09/07/22 with 1cm hh, Erythematous mucosa in the gastric fundus. Treated w/ (APC).GAVE w/ bleeding. Treated w/ (APC). Erythematous duodenopathy.  The patient was discharged home with pantoprazole twice daily.  In the ED, the patient was noted to be tachycardic in the 110s.  He was hypotensive with systolic blood pressure 83.  The patient was given 3.5 L of fluid.  Lactic acid was 3.7.  He was admitted for further evaluation and treatment of SIRS and altered mentation    Assessment/Plan: Frequent falls/failure to thrive -TSH, B12, folate and ammonia within normal limits -Continue supportive care -Continue to follow clinical response. -Physical therapy has recommended skilled nursing facility for rehab at discharge -[facility] assisting with discharge plans.  Generalized weakness -Multifactorial including deconditioning, hypokalemia, possible infectious process. -Following recommendations by  infectious disease service will treat for 2 weeks using oral antibiotics (ciprofloxacin and Zyvox). -Follow physical therapy evaluation/recommendations. -Currently with recommendation for a skilled nursing facility at discharge once medically stable.  Urinary retention -Foley catheter placed in the ED -CT shows tip of the catheter is in the prostatic urethra -Requested nursing to advance/adjust foley -Continue monitoring urine output -Patient denying dysuria and suprapubic tenderness at this moment. -Patient will benefit of outpatient follow-up with urology service; -plan is for patient to be discharged with Foley catheter in place..  Acute metabolic encephalopathy -Patient mentation slowly improving. -Ammonia 30 -CT brain negative -UA negative for pyuria -Positive bacteremia as mentioned below -Continue current antibiotics.  Lactic acidosis/enterococcal and Acinetobacter bacteremia -Suspect this is due to decreased clearance from his hepatic cirrhosis, versus associated with active infection. -Following ID recommendations 2D echo performed; no vegetations appreciated.  Holding on TEE at this moment -Based on cultures sensitivity antibiotics will be narrow to Cipro and Zyvox with plans for 2 weeks of therapy.  Hypokalemia -Repleted and is stable -Magnesium within normal limits -Continue to follow electrolytes trend.  Mild hyponatremia -Most likely associated with chronic cirrhosis and dehydration -Improved and pretty much back to baseline after fluid resuscitation provided -Continue to follow electrolytes trend.  Liver cirrhosis secondary to nonalcoholic steatohepatitis (NASH)  -Pt was seen by transplant hepatology at [facility] on 11-05-2022. He has outpatient labs that showed + ANA and + anti-smooth muscle antibody.  -This calls into question whether his cirrhosis is caused by autoimmune hepatitis. -Continue outpatient follow-up with GI service -LFTs appears to be stable. -Continue  holding Aldactone and Demadex for now.  Crohn's disease of both small  and large intestine with complication ([place]) -Pt with ileostomy.  -Pt denies any high output from ileostomy -Continue to maintain adequate hydration.  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  Bipolar -continue depakote, risperdal, sertraline, benztropine -Overall mood is stable currently.  Mixed Hyperlipidemia -continue statin -Heart healthy diet discussed with patient.  History DVTs/PE -Resume use of Eliquis for secondary prevention.  Pancytopenia -chronic. -Continue patient follow-up with hematology service. -Continue close monitoring of patient's hemoglobin and platelet count. -Hemoglobin has remained stable.  Complaining of delusional effect following fluid resuscitation most likely playing a role in decreased hemoglobin.  Class 2 Obese -BMI 37.04 -Low-calorie diet and portion control discussed with patient.    Family Communication:   None family at bedside.  Consultants: Infectious disease.  Code Status:  FULL   DVT Prophylaxis:  SCDs   Procedures: As Listed in Progress Note Above  Antibiotics: Meropenem  Subjective: Reporting feeling weak, tired and deconditioned; decreased appetite also expressed.  No chest pain, no nausea, no vomiting.  Chronically ill in appearance.   Objective: Vitals:   11/21/22 0426 11/21/22 0823 11/21/22 0826 11/21/22 1418  BP:  (!) 94/36 (!) 94/36 (!) 77/32  Pulse:  98 98 76  Resp:  16 16 18  $ Temp:  97.9 F (36.6 C) 97.9 F (36.6 C) (!) 97.5 F (36.4 C)  TempSrc:  Oral  Oral  SpO2:  98%  98%  Weight: 107.1 kg     Height:        Intake/Output Summary (Last 24 hours) at 11/21/2022 1824 Last data filed at 11/21/2022 1700 Gross per 24 hour  Intake 720 ml  Output 800 ml  Net -80 ml   Weight change: 3.2 kg  Exam: General exam: Alert, awake, oriented x 3; weak  and deconditioned; in no acute distress.  No overt bleeding. Respiratory system: Clear to auscultation. Respiratory effort normal.  Good saturation on room air.  No using accessory muscle. Cardiovascular system:RRR. No rubs or gallops; no JVD. Gastrointestinal system: Abdomen is obese, nondistended, soft and nontender. No organomegaly or masses felt. Normal bowel sounds heard.  Ostomy bag in place. Central nervous system: Alert and oriented. No focal neurological deficits. Extremities: No cyanosis or clubbing; 1-2+ edema appreciated bilaterally.  No open wounds. Skin: No petechiae. Psychiatry: Following commands appropriately; flat affect appreciated.  No suicidal ideation.  Data Reviewed: I have personally reviewed following labs and imaging studies  Basic Metabolic Panel: Recent Labs  Lab 11/15/22 1223 11/15/22 1254 11/16/22 0417 11/17/22 0412 11/18/22 0408 11/20/22 0517  NA 132* 136 134* 133* 131* 131*  K 2.6* 2.8* 4.4 4.8 4.5 4.8  CL 99 97* 105 106 104 101  CO2 25  --  23 24 24 22  $ GLUCOSE 95 91 67* 77 87 68*  BUN 11 10 10 14 16 $ 22*  CREATININE 1.15 1.20 0.95 1.02 0.93 1.11  CALCIUM 8.3*  --  7.9* 7.7* 7.9* 7.8*  MG 1.8  --  1.8 2.0  --   --    Liver Function Tests: Recent Labs  Lab 11/15/22 1223 11/16/22 0417  AST 44* 43*  ALT 19 18  ALKPHOS 98 87  BILITOT 1.1 1.4*  PROT 5.5* 5.0*  ALBUMIN 1.9* 1.8*    Recent Labs  Lab 11/15/22 1905 11/17/22 0412  AMMONIA 30 66*   CBC: Recent Labs  Lab 11/15/22 1223 11/15/22 1254 11/16/22 0417 11/17/22 [id number] 11/18/22 0408 11/20/22 [id number]  WBC 2.3*  --  3.6* 3.8* 4.4 5.7  NEUTROABS 1.2*  --  2.4  --   --   --   HGB 8.7* 9.2* 7.8* 7.2* 7.4* 7.9*  HCT 27.0* 27.0* 24.6* 22.9* 22.7* 26.3*  MCV 102.7*  --  102.5* 104.6* 103.7* 111.4*  PLT 46*  --  46* 47* 59* 79*   Cardiac Enzymes: Recent Labs  Lab 11/16/22 1201  CKTOTAL 215   CBG: Recent Labs  Lab 11/20/22 1610 11/20/22 2029 11/21/22 0749 11/21/22 1149  11/21/22 1657  GLUCAP 79 73 60* 74 67*   Urine analysis:    Component Value Date/Time   COLORURINE YELLOW 11/15/2022 1327   APPEARANCEUR CLEAR 11/15/2022 1327   APPEARANCEUR Cloudy (A) 08/25/2022 1417   LABSPEC 1.012 11/15/2022 1327   PHURINE 6.0 11/15/2022 1327   GLUCOSEU NEGATIVE 11/15/2022 1327   HGBUR NEGATIVE 11/15/2022 1327   BILIRUBINUR NEGATIVE 11/15/2022 1327   BILIRUBINUR Negative 08/25/2022 1417   KETONESUR NEGATIVE 11/15/2022 1327   PROTEINUR NEGATIVE 11/15/2022 1327   UROBILINOGEN 0.2 10/17/2014 1940   NITRITE NEGATIVE 11/15/2022 1327   LEUKOCYTESUR NEGATIVE 11/15/2022 1327   Sepsis Labs:  Recent Results (from the past 240 hour(s))  Blood Culture (routine x 2)     Status: Abnormal   Collection Time: 11/15/22 12:23 PM   Specimen: BLOOD RIGHT HAND  Result Value Ref Range Status   Specimen Description   Final    BLOOD RIGHT HAND Performed at [facility], [address] [place], [place] [address]    Special Requests   Final    BOTTLES DRAWN AEROBIC AND ANAEROBIC Blood Culture adequate volume Performed at [facility], [address] [place], [place] [address]    Culture  Setup Time   Final    GRAM POSITIVE COCCI BOTTLES DRAWN AEROBIC AND ANAEROBIC Gram Stain Report Called to,Read Back By and Verified With: [email or web address],[email or web address]$  BY MATTHEWS B 2.13.2024 CRITICAL RESULT CALLED TO, READ BACK BY AND VERIFIED WITH: PHARMD FRANK WILSON  ON 11/16/22 @ 1444 BY DRT Performed at [facility], [place] [address] [place],  [address]    Culture (A)  Final    ACINETOBACTER CALCOACETICUS/BAUMANNII COMPLEX ENTEROCOCCUS FAECALIS    Report Status 11/18/2022 FINAL  Final   Organism ID, Bacteria ACINETOBACTER CALCOACETICUS/BAUMANNII COMPLEX  Final   Organism ID, Bacteria ENTEROCOCCUS FAECALIS  Final      Susceptibility   Acinetobacter calcoaceticus/baumannii complex - MIC*    CEFTAZIDIME 8 SENSITIVE Sensitive     CIPROFLOXACIN 1 SENSITIVE Sensitive     GENTAMICIN 8  INTERMEDIATE Intermediate     IMIPENEM <=0.25 SENSITIVE Sensitive     PIP/TAZO <=4 SENSITIVE Sensitive     TRIMETH/SULFA <=20 SENSITIVE Sensitive     AMPICILLIN/SULBACTAM <=2 SENSITIVE Sensitive     * ACINETOBACTER CALCOACETICUS/BAUMANNII COMPLEX   Enterococcus faecalis - MIC*    AMPICILLIN <=2 SENSITIVE Sensitive     VANCOMYCIN 1 SENSITIVE Sensitive     GENTAMICIN SYNERGY RESISTANT Resistant     * ENTEROCOCCUS FAECALIS  Blood Culture ID Panel (Reflexed)     Status: Abnormal   Collection Time: 11/15/22 12:23 PM  Result Value Ref Range Status   Enterococcus faecalis NOT DETECTED NOT DETECTED Final   Enterococcus Faecium NOT DETECTED NOT DETECTED Final   Listeria monocytogenes NOT DETECTED NOT DETECTED Final   Staphylococcus species NOT DETECTED NOT DETECTED Final   Staphylococcus aureus (BCID) NOT DETECTED NOT DETECTED Final   Staphylococcus epidermidis NOT DETECTED NOT DETECTED Final   Staphylococcus  lugdunensis NOT DETECTED NOT DETECTED Final   Streptococcus species NOT DETECTED NOT DETECTED Final   Streptococcus agalactiae NOT DETECTED NOT DETECTED Final   Streptococcus pneumoniae NOT DETECTED NOT DETECTED Final   Streptococcus pyogenes NOT DETECTED NOT DETECTED Final   A.calcoaceticus-baumannii DETECTED (A) NOT DETECTED Final    Comment: CRITICAL RESULT CALLED TO, READ BACK BY AND VERIFIED WITH: [place]  ON 11/16/22 @ 1444 BY DRT    Bacteroides fragilis NOT DETECTED NOT DETECTED Final   Enterobacterales NOT DETECTED NOT DETECTED Final   Enterobacter cloacae complex NOT DETECTED NOT DETECTED Final   Escherichia coli NOT DETECTED NOT DETECTED Final   Klebsiella aerogenes NOT DETECTED NOT DETECTED Final   Klebsiella oxytoca NOT DETECTED NOT DETECTED Final   Klebsiella pneumoniae NOT DETECTED NOT DETECTED Final   Proteus species NOT DETECTED NOT DETECTED Final   Salmonella species NOT DETECTED NOT DETECTED Final   Serratia marcescens NOT DETECTED NOT DETECTED Final    Haemophilus influenzae NOT DETECTED NOT DETECTED Final   Neisseria meningitidis NOT DETECTED NOT DETECTED Final   Pseudomonas aeruginosa NOT DETECTED NOT DETECTED Final   Stenotrophomonas maltophilia NOT DETECTED NOT DETECTED Final   Candida albicans NOT DETECTED NOT DETECTED Final   Candida auris NOT DETECTED NOT DETECTED Final   Candida glabrata NOT DETECTED NOT DETECTED Final   Candida krusei NOT DETECTED NOT DETECTED Final   Candida parapsilosis NOT DETECTED NOT DETECTED Final   Candida tropicalis NOT DETECTED NOT DETECTED Final   Cryptococcus neoformans/gattii NOT DETECTED NOT DETECTED Final   CTX-M ESBL NOT DETECTED NOT DETECTED Final   Carbapenem resistance IMP NOT DETECTED NOT DETECTED Final   Carbapenem resistance KPC NOT DETECTED NOT DETECTED Final   Carbapenem resistance NDM NOT DETECTED NOT DETECTED Final   Carbapenem resistance VIM NOT DETECTED NOT DETECTED Final    Comment: Performed at [facility], [address], [place], [place] [address]  Blood Culture (routine x 2)     Status: None   Collection Time: 11/15/22 12:28 PM   Specimen: BLOOD LEFT HAND  Result Value Ref Range Status   Specimen Description BLOOD LEFT HAND  Final   Special Requests   Final    BOTTLES DRAWN AEROBIC ONLY Blood Culture results may not be optimal due to an inadequate volume of blood received in culture bottles   Culture   Final    NO GROWTH 5 DAYS Performed at [facility], [address] [place], [place] [address]    Report Status 11/20/2022 FINAL  Final  Resp panel by RT-PCR (RSV, Flu A&B, Covid) Anterior Nasal Swab     Status: None   Collection Time: 11/15/22  1:28 PM   Specimen: Anterior Nasal Swab  Result Value Ref Range Status   SARS Coronavirus 2 by RT PCR NEGATIVE NEGATIVE Final    Comment: (NOTE) SARS-CoV-2 target nucleic acids are NOT DETECTED.  The SARS-CoV-2 RNA is generally detectable in upper respiratory specimens during the acute phase of infection. The  lowest concentration of SARS-CoV-2 viral copies this assay can detect is 138 copies/mL. A negative result does not preclude SARS-Cov-2 infection and should not be used as the sole basis for treatment or other patient management decisions. A negative result may occur with  improper specimen collection/handling, submission of specimen other than nasopharyngeal swab, presence of viral mutation(s) within the areas targeted by this assay, and inadequate number of viral copies(<138 copies/mL). A negative result must be combined with clinical observations, patient history, and  epidemiological information. The expected result is Negative.  Fact Sheet for Patients:  EntrepreneurPulse.com.au  Fact Sheet for Healthcare Providers:  IncredibleEmployment.be  This test is no t yet approved or cleared by the [place] FDA and  has been authorized for detection and/or diagnosis of SARS-CoV-2 by FDA under an Emergency Use Authorization (EUA). This EUA will remain  in effect (meaning this test can be used) for the duration of the COVID-19 declaration under Section 564(b)(1) of the Act, 21 U.S.C.section 360bbb-3(b)(1), unless the authorization is terminated  or revoked sooner.       Influenza A by PCR NEGATIVE NEGATIVE Final   Influenza B by PCR NEGATIVE NEGATIVE Final    Comment: (NOTE) The Xpert Xpress SARS-CoV-2/FLU/RSV plus assay is intended as an aid in the diagnosis of influenza from Nasopharyngeal swab specimens and should not be used as a sole basis for treatment. Nasal washings and aspirates are unacceptable for Xpert Xpress SARS-CoV-2/FLU/RSV testing.  Fact Sheet for Patients: EntrepreneurPulse.com.au  Fact Sheet for Healthcare Providers: IncredibleEmployment.be  This test is not yet approved or cleared by the [place] FDA and has been authorized for detection and/or diagnosis of SARS-CoV-2 by FDA under  an Emergency Use Authorization (EUA). This EUA will remain in effect (meaning this test can be used) for the duration of the COVID-19 declaration under Section 564(b)(1) of the Act, 21 U.S.C. section 360bbb-3(b)(1), unless the authorization is terminated or revoked.     Resp Syncytial Virus by PCR NEGATIVE NEGATIVE Final    Comment: (NOTE) Fact Sheet for Patients: EntrepreneurPulse.com.au  Fact Sheet for Healthcare Providers: IncredibleEmployment.be  This test is not yet approved or cleared by the [place] FDA and has been authorized for detection and/or diagnosis of SARS-CoV-2 by FDA under an Emergency Use Authorization (EUA). This EUA will remain in effect (meaning this test can be used) for the duration of the COVID-19 declaration under Section 564(b)(1) of the Act, 21 U.S.C. section 360bbb-3(b)(1), unless the authorization is terminated or revoked.  Performed at [facility], [address], [place], [place] [address]   Culture, blood (Routine X 2) w Reflex to ID Panel     Status: None (Preliminary result)   Collection Time: 11/17/22  4:52 PM   Specimen: BLOOD RIGHT HAND  Result Value Ref Range Status   Specimen Description BLOOD RIGHT HAND  Final   Special Requests   Final    BOTTLES DRAWN AEROBIC AND ANAEROBIC Blood Culture results may not be optimal due to an inadequate volume of blood received in culture bottles   Culture   Final    NO GROWTH 4 DAYS Performed at [facility], [address], [place], [place] [address]    Report Status PENDING  Incomplete  Culture, blood (Routine X 2) w Reflex to ID Panel     Status: None (Preliminary result)   Collection Time: 11/17/22  6:30 PM   Specimen: BLOOD LEFT HAND  Result Value Ref Range Status   Specimen Description BLOOD LEFT HAND  Final   Special Requests   Final    BOTTLES DRAWN AEROBIC ONLY Blood Culture adequate volume   Culture   Final    NO GROWTH 4 DAYS Performed at [facility], [address], [place], [place] [address]    Report Status PENDING  Incomplete     Scheduled Meds:  apixaban  5 mg Oral BID   atorvastatin  80 mg Oral Daily   benztropine  0.5 mg Oral Daily   ciprofloxacin  500 mg Oral BID   divalproex  1,000 mg Oral QHS   divalproex  250 mg Oral Daily   hydroxychloroquine  200 mg Oral BID   insulin aspart  0-5 Units Subcutaneous QHS   insulin aspart  0-9 Units Subcutaneous TID WC   linezolid  600 mg Oral Q12H   metoprolol succinate  25 mg Oral Daily   pantoprazole  40 mg Oral BID   risperiDONE  1 mg Oral QHS   risperiDONE  6 mg Oral QHS   sertraline  50 mg Oral Daily   traZODone  100 mg Oral QHS    Procedures/Studies: ECHOCARDIOGRAM COMPLETE  Result Date: 11/18/2022    ECHOCARDIOGRAM REPORT   Patient Name:   ERROLL NORSWORTHY Date of Exam: 11/18/2022 Medical Rec #:  [id number]      Height:       66.0 in Accession #:    [id number]     Weight:       228.4 lb Date of Birth:  Apr 04, 1966     BSA:          2.116 m Patient Age:    72 years       BP:           105/56 mmHg Patient Gender: M              HR:           91 bpm. Exam Location:  [facility] Procedure: 2D Echo, Cardiac Doppler and Color Doppler Indications:    Bacteremia  History:        Patient has prior history of Echocardiogram examinations, most                 recent 11/13/2020. Cirrhosis; Risk Factors:Diabetes, Current                 Smoker and Dyslipidemia.  Sonographer:    Johny Chess RDCS Referring Phys: [phone] Princetta Uplinger  Sonographer Comments: Technically difficult study due to poor echo windows. Image acquisition challenging due to respiratory motion. IMPRESSIONS  1. Left ventricular ejection fraction, by estimation, is 60 to 65%. The left ventricle has normal function. Left ventricular endocardial border not optimally defined to evaluate regional wall motion. Left ventricular diastolic parameters were normal.  2. Right ventricular systolic function is normal. The right ventricular  size is normal. Tricuspid regurgitation signal is inadequate for assessing PA pressure.  3. Left atrial size was mildly dilated.  4. The mitral valve is grossly normal. Trivial mitral valve regurgitation.  5. The aortic valve is tricuspid. Aortic valve regurgitation is not visualized.  6. The inferior vena cava is normal in size with greater than 50% respiratory variability, suggesting right atrial pressure of 3 mmHg.  7. No obvious valvular vegetations noted with significantly limited views. Comparison(s): Prior images reviewed side by side. LVEF remains normal range at 60-65%. FINDINGS  Left Ventricle: Left ventricular ejection fraction, by estimation, is 60 to 65%. The left ventricle has normal function. Left ventricular endocardial border not optimally defined to evaluate regional wall motion. The left ventricular internal cavity size was normal in size. There is no left ventricular hypertrophy. Left ventricular diastolic parameters were normal. Right Ventricle: The right ventricular size is normal. No increase in right ventricular wall thickness. Right ventricular systolic function is normal. Tricuspid regurgitation signal is inadequate for assessing PA pressure. Left Atrium: Left atrial size was mildly dilated. Right Atrium: Right atrial size was normal in size. Pericardium: There is no evidence  of pericardial effusion. Presence of epicardial fat layer. Mitral Valve: The mitral valve is grossly normal. Trivial mitral valve regurgitation. Tricuspid Valve: The tricuspid valve is grossly normal. Tricuspid valve regurgitation is trivial. Aortic Valve: The aortic valve is tricuspid. There is mild aortic valve annular calcification. Aortic valve regurgitation is not visualized. Pulmonic Valve: The pulmonic valve was grossly normal. Pulmonic valve regurgitation is trivial. Aorta: The aortic root is normal in size and structure. Venous: The inferior vena cava is normal in size with greater than 50% respiratory  variability, suggesting right atrial pressure of 3 mmHg. IAS/Shunts: No atrial level shunt detected by color flow Doppler.  LEFT VENTRICLE PLAX 2D LVIDd:         4.40 cm   Diastology LVIDs:         2.80 cm   LV e' medial:    9.25 cm/s LV PW:         0.90 cm   LV E/e' medial:  10.5 LV IVS:        0.70 cm   LV e' lateral:   15.30 cm/s LVOT diam:     1.80 cm   LV E/e' lateral: 6.4 LV SV:         54 LV SV Index:   26 LVOT Area:     2.54 cm  RIGHT VENTRICLE             IVC RV S prime:     13.60 cm/s  IVC diam: 1.40 cm TAPSE (M-mode): 4.0 cm LEFT ATRIUM           Index        RIGHT ATRIUM           Index LA diam:      3.00 cm 1.42 cm/m   RA Area:     10.70 cm LA Vol (A4C): 77.8 ml 36.77 ml/m  RA Volume:   23.60 ml  11.15 ml/m  AORTIC VALVE LVOT Vmax:   131.00 cm/s LVOT Vmean:  84.800 cm/s LVOT VTI:    0.214 m  AORTA Ao Root diam: 2.60 cm MITRAL VALVE MV Area (PHT): 3.93 cm    SHUNTS MV Decel Time: 193 msec    Systemic VTI:  0.21 m MV E velocity: 97.40 cm/s  Systemic Diam: 1.80 cm MV A velocity: 99.00 cm/s MV E/A ratio:  0.98 Rozann Lesches MD Electronically signed by Rozann Lesches MD Signature Date/Time: 11/18/2022/2:31:20 PM    Final    CT CHEST ABDOMEN PELVIS W CONTRAST  Result Date: 11/15/2022 CLINICAL DATA:  Sepsis, polytrauma, fall EXAM: CT CHEST, ABDOMEN, AND PELVIS WITH CONTRAST TECHNIQUE: Multidetector CT imaging of the chest, abdomen and pelvis was performed following the standard protocol during bolus administration of intravenous contrast. RADIATION DOSE REDUCTION: This exam was performed according to the departmental dose-optimization program which includes automated exposure control, adjustment of the mA and/or kV according to patient size and/or use of iterative reconstruction technique. CONTRAST:  171m OMNIPAQUE IOHEXOL 300 MG/ML  SOLN COMPARISON:  CT abdomen pelvis, 09/06/2022 CT chest, 04/15/2022 FINDINGS: CT CHEST FINDINGS Cardiovascular: Right chest port catheter. Aortic atherosclerosis.  Normal heart size. Left coronary artery calcifications. No pericardial effusion. Mediastinum/Nodes: No enlarged mediastinal, hilar, or axillary lymph nodes. Small incidental diverticulum of the posterior right trachea at the level of the thoracic inlet (series 2, image 8). Thyroid gland and esophagus demonstrate no significant findings. Lungs/Pleura: Moderate right pleural effusion and associated atelectasis or consolidation. Musculoskeletal: No chest wall abnormality. No acute osseous findings. Status  post bilateral shoulder arthroplasty. CT ABDOMEN PELVIS FINDINGS Hepatobiliary: Coarse contour of the liver. No gallstones, gallbladder wall thickening, or biliary dilatation. Pancreas: Unremarkable. No pancreatic ductal dilatation or surrounding inflammatory changes. Spleen: Normal in size without significant abnormality. Adrenals/Urinary Tract: Adrenal glands are unremarkable. Kidneys are normal, without renal calculi, solid lesion, or hydronephrosis. Malpositioned Foley catheter, tip and balloon within the prostatic or bulbous urethra (series 5, image 111) Stomach/Bowel: Stomach is within normal limits. 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 Vascular/Lymphatic: Aortic atherosclerosis. 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 No enlarged abdominal or pelvic lymph nodes. Reproductive: No mass or other abnormality. Other: No abdominal wall hernia or abnormality. Small volume perihepatic ascites. Musculoskeletal: No acute osseous findings. 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 IMPRESSION: 1. No CT evidence of acute traumatic injury to the chest, abdomen, or pelvis. 2. Moderate right pleural effusion and associated atelectasis or consolidation. 3. Malpositioned Foley catheter, tip and balloon within the prostatic or bulbous urethra. Recommend repositioning. 4. 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 5. Small volume  perihepatic ascites. 6. Coarse contour of the liver and gastric varices, suggestive of cirrhosis. 7. Coronary artery disease. Aortic Atherosclerosis (ICD10-I70.0). Electronically Signed   By: Delanna Ahmadi M.D.   On: 11/15/2022 14:32   CT Head Wo Contrast  Result Date: 11/15/2022 CLINICAL DATA:  Three falls in the past 24 hours. EXAM: CT HEAD WITHOUT CONTRAST CT CERVICAL SPINE WITHOUT CONTRAST TECHNIQUE: Multidetector CT imaging of the head and cervical spine was performed following the standard protocol without intravenous contrast. Multiplanar CT image reconstructions of the cervical spine were also generated. RADIATION DOSE REDUCTION: This exam was performed according to the departmental dose-optimization program which includes automated exposure control, adjustment of the mA and/or kV according to patient size and/or use of iterative reconstruction technique. COMPARISON:  CT head 05/16/2021 FINDINGS: CT HEAD FINDINGS Brain: There is no acute intracranial hemorrhage, extra-axial fluid collection, or acute infarct. Parenchymal volume is normal. The ventricles are normal in size. Gray-white differentiation is preserved. The pituitary and suprasellar region are normal. There is no mass lesion. There is no mass effect or midline shift. Vascular: No hyperdense vessel or unexpected calcification. Skull: Normal. Negative for fracture or focal lesion. Sinuses/Orbits: There is mild mucosal thickening in the paranasal sinuses. The globes and orbits are unremarkable. Other: None. CT CERVICAL SPINE FINDINGS Alignment: Normal. There is no jumped or perched facet or other evidence of traumatic malalignment. Skull base and vertebrae: Skull base alignment is maintained. Vertebral body heights are preserved. There is no evidence of acute fracture. There is no suspicious osseous lesion. Soft tissues and spinal canal: No prevertebral fluid or swelling. No visible canal hematoma. Disc levels: Facet arthropathy is most advanced  on the right at C3-C4. There is overall mild disc space narrowing at C4-C5 and C5-C6. There is no convincing high-grade spinal canal stenosis. Upper chest: Assessed on the separately dictated CT chest. Other: None. IMPRESSION: 1. No acute intracranial pathology. 2. No acute fracture or traumatic malalignment of the cervical spine. Electronically Signed   By: Valetta Mole M.D.   On: 11/15/2022 14:24   CT Cervical Spine Wo Contrast  Result Date: 11/15/2022 CLINICAL DATA:  Three falls in the past 24 hours. EXAM: CT HEAD WITHOUT CONTRAST CT CERVICAL SPINE WITHOUT CONTRAST TECHNIQUE: Multidetector CT imaging of the head and cervical spine was performed following the standard protocol without intravenous contrast. Multiplanar CT image reconstructions of the  cervical spine were also generated. RADIATION DOSE REDUCTION: This exam was performed according to the departmental dose-optimization program which includes automated exposure control, adjustment of the mA and/or kV according to patient size and/or use of iterative reconstruction technique. COMPARISON:  CT head 05/16/2021 FINDINGS: CT HEAD FINDINGS Brain: There is no acute intracranial hemorrhage, extra-axial fluid collection, or acute infarct. Parenchymal volume is normal. The ventricles are normal in size. Gray-white differentiation is preserved. The pituitary and suprasellar region are normal. There is no mass lesion. There is no mass effect or midline shift. Vascular: No hyperdense vessel or unexpected calcification. Skull: Normal. Negative for fracture or focal lesion. Sinuses/Orbits: There is mild mucosal thickening in the paranasal sinuses. The globes and orbits are unremarkable. Other: None. CT CERVICAL SPINE FINDINGS Alignment: Normal. There is no jumped or perched facet or other evidence of traumatic malalignment. Skull base and vertebrae: Skull base alignment is maintained. Vertebral body heights are preserved. There is no evidence of acute fracture.  There is no suspicious osseous lesion. Soft tissues and spinal canal: No prevertebral fluid or swelling. No visible canal hematoma. Disc levels: Facet arthropathy is most advanced on the right at C3-C4. There is overall mild disc space narrowing at C4-C5 and C5-C6. There is no convincing high-grade spinal canal stenosis. Upper chest: Assessed on the separately dictated CT chest. Other: None. IMPRESSION: 1. No acute intracranial pathology. 2. No acute fracture or traumatic malalignment of the cervical spine. Electronically Signed   By: Valetta Mole M.D.   On: 11/15/2022 14:24   DG Chest Port 1 View  Result Date: 11/15/2022 CLINICAL DATA:  Sepsis, possible UTI EXAM: PORTABLE CHEST 1 VIEW COMPARISON:  CT chest dated 03/16/2022 FINDINGS: Lungs are clear.  No pleural effusion or pneumothorax. The heart is normal in size. Right chest power port terminates at the cavoatrial junction. Bilateral shoulder arthroplasty. IMPRESSION: No evidence of acute cardiopulmonary disease. Electronically Signed   By: Julian Hy M.D.   On: 11/15/2022 13:35    Barton Dubois, MD  [facility]  If 7PM-7AM, please contact night-coverage [email or web address] Password [facility] 11/21/2022, 6:24 PM   LOS: 5 days

## 2022-11-21 NOTE — TOC Progression Note (Signed)
Transition of Care [facility]) - Progression Note    Patient Details  Name: [name] MRN: [id number] Date of Birth: 1966/02/11  [facility] CM/SW Contact  Rodney Booze, [place] Phone Number: 11/21/2022, 1:35 PM  Clinical Narrative:    CSW spoke to the patient's sister to make her aware that there currently no bed offers at this time. This CSW has also reached out to [facility] from [place] to ask about the patients's pending offer. At this time CSW was not able to reach the facility. TOC will continue to follow the patient in regards to placement.   Expected Discharge Plan: [place] Barriers to Discharge: Continued Medical Work up  Expected Discharge Plan and Services                                               Social Determinants of Health (SDOH) Interventions [place]: Food Insecurity Present (09/07/2022)  Housing: Low Risk  (09/07/2022)  Transportation Needs: No Transportation Needs (09/07/2022)  Utilities: Not At Risk (09/07/2022)  Tobacco Use: High Risk (11/15/2022)    Readmission Risk Interventions     No data to display

## 2022-11-21 NOTE — Progress Notes (Signed)
Pt A&O x 2. DBP and MAP low, other vitals stable. Pt slept through the night.

## 2022-11-22 DIAGNOSIS — Z86718 Personal history of other venous thrombosis and embolism: Secondary | ICD-10-CM | POA: Diagnosis not present

## 2022-11-22 DIAGNOSIS — J90 Pleural effusion, not elsewhere classified: Secondary | ICD-10-CM | POA: Diagnosis not present

## 2022-11-22 DIAGNOSIS — K50819 Crohn's disease of both small and large intestine with unspecified complications: Secondary | ICD-10-CM | POA: Diagnosis not present

## 2022-11-22 DIAGNOSIS — E876 Hypokalemia: Secondary | ICD-10-CM | POA: Diagnosis not present

## 2022-11-22 LAB — BASIC METABOLIC PANEL
Anion gap: 6 (ref 5–15)
BUN: 26 mg/dL — ABNORMAL HIGH (ref 6–20)
CO2: 21 mmol/L — ABNORMAL LOW (ref 22–32)
Calcium: 7.9 mg/dL — ABNORMAL LOW (ref 8.9–10.3)
Chloride: 104 mmol/L (ref 98–111)
Creatinine, Ser: 1.1 mg/dL (ref 0.61–1.24)
GFR, Estimated: >60 mL/min (ref >60)
Glucose, Bld: 104 mg/dL — ABNORMAL HIGH (ref 70–99)
Potassium: 4.6 mmol/L (ref 3.5–5.1)
Sodium: 131 mmol/L — ABNORMAL LOW (ref 135–145)

## 2022-11-22 LAB — GLUCOSE, CAPILLARY
Glucose-Capillary: 86 mg/dL (ref 70–99)
Glucose-Capillary: 161 mg/dL — ABNORMAL HIGH (ref 70–99)
Glucose-Capillary: 99 mg/dL (ref 70–99)
Glucose-Capillary: 90 mg/dL (ref 70–99)

## 2022-11-22 LAB — CBC
HCT: 31.3 % — ABNORMAL LOW (ref 39.0–52.0)
Hemoglobin: 9 g/dL — ABNORMAL LOW (ref 13.0–17.0)
MCH: 33 pg (ref 26.0–34.0)
MCHC: 28.8 g/dL — ABNORMAL LOW (ref 30.0–36.0)
MCV: 114.7 fL — ABNORMAL HIGH (ref 80.0–100.0)
Platelets: 75 10*3/uL — ABNORMAL LOW (ref 150–400)
RBC: 2.73 MIL/uL — ABNORMAL LOW (ref 4.22–5.81)
RDW: 17.8 % — ABNORMAL HIGH (ref 11.5–15.5)
WBC: 5.4 10*3/uL (ref 4.0–10.5)
nRBC: 0 % (ref 0.0–0.2)

## 2022-11-22 LAB — CULTURE, BLOOD (ROUTINE X 2)
Culture: NO GROWTH
Culture: NO GROWTH
Special Requests: ADEQUATE

## 2022-11-22 MED ORDER — CHLORHEXIDINE GLUCONATE CLOTH 2 % EX PADS
6.0000 | MEDICATED_PAD | Freq: Every day | CUTANEOUS | Status: DC
  Administered 2022-11-22 – 2022-11-29 (×8): 6 via TOPICAL

## 2022-11-22 NOTE — Progress Notes (Signed)
PROGRESS NOTE  HAMDAN BANTER [id number] DOB: [birth year] DOA: 11/15/2022 PCP: [facility]  Brief History:  57 year old male with a history of diabetes mellitus type 2, pancytopenia, recurrent DVT/PE on Eliquis, RA, Crohn's colitis status post colectomy with ileostomy 2009 presenting with multiple falls.  The patient is a poor historian. Patient was seen on 05 November 2022 by liver transplant at [facility].  He was noted to have nonalcoholic fatty liver disease along with cirrhosis.  Autoimmune workup was started.  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  Has not had follow-up yet.   Patient been taking Demadex 20 mg a day prescribed by his cardiologist due to lower extremity edema.  Patient has not been started on Aldactone yet. This was mentioned during his hepatology consult. Patient states that he is taking Aldactone but there is no recording of this in his chart.   The patient was recently hospitalized from 09/06/2022 to 09/09/2022 for GI bleed.  EGD  09/07/22 with 1cm hh, Erythematous mucosa in the gastric fundus. Treated w/ (APC).GAVE w/ bleeding. Treated w/ (APC). Erythematous duodenopathy.  The patient was discharged home with pantoprazole twice daily.  In the ED, the patient was noted to be tachycardic in the 110s.  He was hypotensive with systolic blood pressure 83.  The patient was given 3.5 L of fluid.  Lactic acid was 3.7.  He was admitted for further evaluation and treatment of SIRS and altered mentation    Assessment/Plan: Frequent falls/failure to thrive -TSH, B12, folate and ammonia within normal limits -Continue supportive care -Continue to follow clinical response. -Physical therapy has recommended skilled nursing facility for rehab at discharge -[facility] assisting with discharge plans.  Generalized weakness -Multifactorial including deconditioning, hypokalemia, possible infectious process. -Following recommendations by  infectious disease service will treat for 2 weeks using oral antibiotics (ciprofloxacin and Zyvox). -Follow physical therapy evaluation/recommendations. -Currently with recommendation for a skilled nursing facility at discharge once medically stable.  Urinary retention -Foley catheter placed in the ED -CT shows tip of the catheter is in the prostatic urethra -Requested nursing to advance/adjust foley -Continue monitoring urine output -Patient denying dysuria and suprapubic tenderness at this moment. -Patient will benefit of outpatient follow-up with urology service; -plan is for patient to be discharged with Foley catheter in place..  Acute metabolic encephalopathy -Patient mentation slowly improving. -Ammonia 30 -CT brain negative -UA negative for pyuria -Positive bacteremia as mentioned below -Continue current antibiotics.  Lactic acidosis/enterococcal and Acinetobacter bacteremia -Suspect this is due to decreased clearance from his hepatic cirrhosis, versus associated with active infection. -Following ID recommendations 2D echo performed; no vegetations appreciated.  Holding on TEE at this moment -Based on cultures sensitivity antibiotics will be narrow to Cipro and Zyvox with plans for 2 weeks of therapy.  Hypokalemia -Repleted and is stable -Magnesium within normal limits -Continue to follow electrolytes trend.  Mild hyponatremia -Most likely associated with chronic cirrhosis and dehydration -Improved and pretty much back to baseline after fluid resuscitation provided -Continue to follow electrolytes trend.  Liver cirrhosis secondary to nonalcoholic steatohepatitis (NASH)  -Pt was seen by transplant hepatology at [facility] on 11-05-2022. He has outpatient labs that showed + ANA and + anti-smooth muscle antibody.  -This calls into question whether his cirrhosis is caused by autoimmune hepatitis. -Continue outpatient follow-up with GI service -LFTs appears to be stable. -Continue  holding Aldactone and Demadex for now.  Crohn's disease of both small  and large intestine with complication ([place]) -Pt with ileostomy.  -Pt denies any high output from ileostomy -Continue to maintain adequate hydration.  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  Bipolar -continue depakote, risperdal, sertraline, benztropine -Overall mood is stable currently.  Mixed Hyperlipidemia -continue statin -Heart healthy diet discussed with patient.  History DVTs/PE -Resume use of Eliquis for secondary prevention.  Pancytopenia -chronic. -Continue patient follow-up with hematology service. -Continue close monitoring of patient's hemoglobin and platelet count. -Hemoglobin has remained stable.  Complaining of delusional effect following fluid resuscitation most likely playing a role in decreased hemoglobin.  Class 2 Obese -BMI 37.04 -Low-calorie diet and portion control discussed with patient.    Family Communication:   None family at bedside.  Consultants: Infectious disease.  Code Status:  FULL   DVT Prophylaxis:  SCDs   Procedures: As Listed in Progress Note Above  Antibiotics: Meropenem  Subjective: Chronically ill in appearance; feeling weak and deconditioned.  No chest pain, no nausea, no vomiting.  No overt bleeding.  Hemodynamically stable   Objective: Vitals:   11/21/22 2138 11/22/22 0423 11/22/22 0500 11/22/22 0811  BP: (!) 110/55 (!) 105/49  (!) 119/57  Pulse: 76 84  83  Resp: 20 16  17  $ Temp: 98.2 F (36.8 C) 97.7 F (36.5 C)  97.6 F (36.4 C)  TempSrc: Oral Oral  Oral  SpO2: 99% 97%  100%  Weight:   113.6 kg   Height:        Intake/Output Summary (Last 24 hours) at 11/22/2022 1524 Last data filed at 11/22/2022 1300 Gross per 24 hour  Intake 1440 ml  Output 1250 ml  Net 190 ml   Weight change: 6.5 kg  Exam: General exam: No acute distress; no overnight  events.  Patient reports generalized weakness and deconditioning.  No overt bleeding. Respiratory system: Clear to auscultation. Respiratory effort normal.  Good saturation on room air. Cardiovascular system:RRR. No rubs or gallops. Gastrointestinal system: Abdomen is nondistended, soft and nontender.  Ostomy bag in place; positive bowel sounds. Central nervous system: Alert and oriented. No focal neurological deficits. Extremities: No cyanosis or clubbing; 2+ edema appreciated bilaterally. Skin: No petechiae.  Foley catheter in place. Psychiatry: Flat affect; no suicidal ideation or hallucinations.  Data Reviewed: I have personally reviewed following labs and imaging studies  Basic Metabolic Panel: Recent Labs  Lab 11/16/22 0417 11/17/22 0412 11/18/22 0408 11/20/22 0517 11/22/22 0508  NA 134* 133* 131* 131* 131*  K 4.4 4.8 4.5 4.8 4.6  CL 105 106 104 101 104  CO2 23 24 24 22 $ 21*  GLUCOSE 67* 77 87 68* 104*  BUN 10 14 16 $ 22* 26*  CREATININE 0.95 1.02 0.93 1.11 1.10  CALCIUM 7.9* 7.7* 7.9* 7.8* 7.9*  MG 1.8 2.0  --   --   --    Liver Function Tests: Recent Labs  Lab 11/16/22 0417  AST 43*  ALT 18  ALKPHOS 87  BILITOT 1.4*  PROT 5.0*  ALBUMIN 1.8*    Recent Labs  Lab 11/15/22 1905 11/17/22 0412  AMMONIA 30 66*   CBC: Recent Labs  Lab 11/16/22 0417 11/17/22 0412 11/18/22 0408 11/20/22 0517 11/22/22 1114  WBC 3.6* 3.8* 4.4 5.7 5.4  NEUTROABS 2.4  --   --   --   --   HGB 7.8* 7.2* 7.4* 7.9* 9.0*  HCT 24.6* 22.9* 22.7* 26.3* 31.3*  MCV 102.5* 104.6* 103.7* 111.4*  114.7*  PLT 46* 47* 59* 79* 75*   Cardiac Enzymes: Recent Labs  Lab 11/16/22 1201  CKTOTAL 215   CBG: Recent Labs  Lab 11/21/22 1839 11/21/22 2053 11/21/22 2140 11/22/22 0740 11/22/22 1206  GLUCAP 111* 103* 98 86 161*   Urine analysis:    Component Value Date/Time   COLORURINE YELLOW 11/15/2022 [place] 11/15/2022 1327   APPEARANCEUR Cloudy (A) 08/25/2022 1417    LABSPEC 1.012 11/15/2022 1327   PHURINE 6.0 11/15/2022 1327   GLUCOSEU NEGATIVE 11/15/2022 1327   HGBUR NEGATIVE 11/15/2022 1327   BILIRUBINUR NEGATIVE 11/15/2022 1327   BILIRUBINUR Negative 08/25/2022 1417   KETONESUR NEGATIVE 11/15/2022 1327   PROTEINUR NEGATIVE 11/15/2022 1327   UROBILINOGEN 0.2 10/17/2014 1940   NITRITE NEGATIVE 11/15/2022 1327   LEUKOCYTESUR NEGATIVE 11/15/2022 1327   Sepsis Labs:  Recent Results (from the past 240 hour(s))  Blood Culture (routine x 2)     Status: Abnormal   Collection Time: 11/15/22 12:23 PM   Specimen: BLOOD RIGHT HAND  Result Value Ref Range Status   Specimen Description   Final    BLOOD RIGHT HAND Performed at [facility], [address], [place], [place] [address]    Special Requests   Final    BOTTLES DRAWN AEROBIC AND ANAEROBIC Blood Culture adequate volume Performed at [facility], [address], [place], [place] [address]    Culture  Setup Time   Final    GRAM POSITIVE COCCI BOTTLES DRAWN AEROBIC AND ANAEROBIC Gram Stain Report Called to,Read Back By and Verified With: [email or web address],[email or web address]$  BY MATTHEWS B 2.13.2024 CRITICAL RESULT CALLED TO, READ BACK BY AND VERIFIED WITH: PHARMD FRANK WILSON  ON 11/16/22 @ 1444 BY DRT Performed at [facility], [place] [address] [place], [place] [address]    Culture (A)  Final    ACINETOBACTER CALCOACETICUS/BAUMANNII COMPLEX ENTEROCOCCUS FAECALIS    Report Status 11/18/2022 FINAL  Final   Organism ID, Bacteria ACINETOBACTER CALCOACETICUS/BAUMANNII COMPLEX  Final   Organism ID, Bacteria ENTEROCOCCUS FAECALIS  Final      Susceptibility   Acinetobacter calcoaceticus/baumannii complex - MIC*    CEFTAZIDIME 8 SENSITIVE Sensitive     CIPROFLOXACIN 1 SENSITIVE Sensitive     GENTAMICIN 8 INTERMEDIATE Intermediate     IMIPENEM <=0.25 SENSITIVE Sensitive     PIP/TAZO <=4 SENSITIVE Sensitive     TRIMETH/SULFA <=20 SENSITIVE Sensitive     AMPICILLIN/SULBACTAM <=2 SENSITIVE Sensitive     * ACINETOBACTER  CALCOACETICUS/BAUMANNII COMPLEX   Enterococcus faecalis - MIC*    AMPICILLIN <=2 SENSITIVE Sensitive     VANCOMYCIN 1 SENSITIVE Sensitive     GENTAMICIN SYNERGY RESISTANT Resistant     * ENTEROCOCCUS FAECALIS  Blood Culture ID Panel (Reflexed)     Status: Abnormal   Collection Time: 11/15/22 12:23 PM  Result Value Ref Range Status   Enterococcus faecalis NOT DETECTED NOT DETECTED Final   Enterococcus Faecium NOT DETECTED NOT DETECTED Final   Listeria monocytogenes NOT DETECTED NOT DETECTED Final   Staphylococcus species NOT DETECTED NOT DETECTED Final   Staphylococcus aureus (BCID) NOT DETECTED NOT DETECTED Final   Staphylococcus epidermidis NOT DETECTED NOT DETECTED Final   Staphylococcus lugdunensis NOT DETECTED NOT DETECTED Final   Streptococcus species NOT DETECTED NOT DETECTED Final   Streptococcus agalactiae NOT DETECTED NOT DETECTED Final   Streptococcus pneumoniae NOT DETECTED NOT DETECTED Final   Streptococcus pyogenes NOT DETECTED NOT DETECTED Final   A.calcoaceticus-baumannii DETECTED (A) NOT DETECTED Final  Comment: CRITICAL RESULT CALLED TO, READ BACK BY AND VERIFIED WITH: PHARMD FRANK WILSON  ON 11/16/22 @ 1444 BY DRT    Bacteroides fragilis NOT DETECTED NOT DETECTED Final   Enterobacterales NOT DETECTED NOT DETECTED Final   Enterobacter cloacae complex NOT DETECTED NOT DETECTED Final   Escherichia coli NOT DETECTED NOT DETECTED Final   Klebsiella aerogenes NOT DETECTED NOT DETECTED Final   Klebsiella oxytoca NOT DETECTED NOT DETECTED Final   Klebsiella pneumoniae NOT DETECTED NOT DETECTED Final   Proteus species NOT DETECTED NOT DETECTED Final   Salmonella species NOT DETECTED NOT DETECTED Final   Serratia marcescens NOT DETECTED NOT DETECTED Final   Haemophilus influenzae NOT DETECTED NOT DETECTED Final   Neisseria meningitidis NOT DETECTED NOT DETECTED Final   Pseudomonas aeruginosa NOT DETECTED NOT DETECTED Final   Stenotrophomonas maltophilia NOT DETECTED NOT  DETECTED Final   Candida albicans NOT DETECTED NOT DETECTED Final   Candida auris NOT DETECTED NOT DETECTED Final   Candida glabrata NOT DETECTED NOT DETECTED Final   Candida krusei NOT DETECTED NOT DETECTED Final   Candida parapsilosis NOT DETECTED NOT DETECTED Final   Candida tropicalis NOT DETECTED NOT DETECTED Final   Cryptococcus neoformans/gattii NOT DETECTED NOT DETECTED Final   CTX-M ESBL NOT DETECTED NOT DETECTED Final   Carbapenem resistance IMP NOT DETECTED NOT DETECTED Final   Carbapenem resistance KPC NOT DETECTED NOT DETECTED Final   Carbapenem resistance NDM NOT DETECTED NOT DETECTED Final   Carbapenem resistance VIM NOT DETECTED NOT DETECTED Final    Comment: Performed at [facility], [address], [place], [place] [address]  Blood Culture (routine x 2)     Status: None   Collection Time: 11/15/22 12:28 PM   Specimen: BLOOD LEFT HAND  Result Value Ref Range Status   Specimen Description BLOOD LEFT HAND  Final   Special Requests   Final    BOTTLES DRAWN AEROBIC ONLY Blood Culture results may not be optimal due to an inadequate volume of blood received in culture bottles   Culture   Final    NO GROWTH 5 DAYS Performed at [facility], [address], [place], [place] [address]    Report Status 11/20/2022 FINAL  Final  Resp panel by RT-PCR (RSV, Flu A&B, Covid) Anterior Nasal Swab     Status: None   Collection Time: 11/15/22  1:28 PM   Specimen: Anterior Nasal Swab  Result Value Ref Range Status   SARS Coronavirus 2 by RT PCR NEGATIVE NEGATIVE Final    Comment: (NOTE) SARS-CoV-2 target nucleic acids are NOT DETECTED.  The SARS-CoV-2 RNA is generally detectable in upper respiratory specimens during the acute phase of infection. The lowest concentration of SARS-CoV-2 viral copies this assay can detect is 138 copies/mL. A negative result does not preclude SARS-Cov-2 infection and should not be used as the sole basis for treatment or other patient management  decisions. A negative result may occur with  improper specimen collection/handling, submission of specimen other than nasopharyngeal swab, presence of viral mutation(s) within the areas targeted by this assay, and inadequate number of viral copies(<138 copies/mL). A negative result must be combined with clinical observations, patient history, and epidemiological information. The expected result is Negative.  Fact Sheet for Patients:  EntrepreneurPulse.com.au  Fact Sheet for Healthcare Providers:  IncredibleEmployment.be  This test is no t yet approved or cleared by the [place] FDA and  has been authorized for detection and/or diagnosis of SARS-CoV-2 by FDA under an Emergency  Use Authorization (EUA). This EUA will remain  in effect (meaning this test can be used) for the duration of the COVID-19 declaration under Section 564(b)(1) of the Act, 21 U.S.C.section 360bbb-3(b)(1), unless the authorization is terminated  or revoked sooner.       Influenza A by PCR NEGATIVE NEGATIVE Final   Influenza B by PCR NEGATIVE NEGATIVE Final    Comment: (NOTE) The Xpert Xpress SARS-CoV-2/FLU/RSV plus assay is intended as an aid in the diagnosis of influenza from Nasopharyngeal swab specimens and should not be used as a sole basis for treatment. Nasal washings and aspirates are unacceptable for Xpert Xpress SARS-CoV-2/FLU/RSV testing.  Fact Sheet for Patients: EntrepreneurPulse.com.au  Fact Sheet for Healthcare Providers: IncredibleEmployment.be  This test is not yet approved or cleared by the [place] FDA and has been authorized for detection and/or diagnosis of SARS-CoV-2 by FDA under an Emergency Use Authorization (EUA). This EUA will remain in effect (meaning this test can be used) for the duration of the COVID-19 declaration under Section 564(b)(1) of the Act, 21 U.S.C. section 360bbb-3(b)(1), unless the  authorization is terminated or revoked.     Resp Syncytial Virus by PCR NEGATIVE NEGATIVE Final    Comment: (NOTE) Fact Sheet for Patients: EntrepreneurPulse.com.au  Fact Sheet for Healthcare Providers: IncredibleEmployment.be  This test is not yet approved or cleared by the [place] FDA and has been authorized for detection and/or diagnosis of SARS-CoV-2 by FDA under an Emergency Use Authorization (EUA). This EUA will remain in effect (meaning this test can be used) for the duration of the COVID-19 declaration under Section 564(b)(1) of the Act, 21 U.S.C. section 360bbb-3(b)(1), unless the authorization is terminated or revoked.  Performed at [facility], [address], [place], [place] [address]   Culture, blood (Routine X 2) w Reflex to ID Panel     Status: None   Collection Time: 11/17/22  4:52 PM   Specimen: BLOOD RIGHT HAND  Result Value Ref Range Status   Specimen Description BLOOD RIGHT HAND  Final   Special Requests   Final    BOTTLES DRAWN AEROBIC AND ANAEROBIC Blood Culture results may not be optimal due to an inadequate volume of blood received in culture bottles   Culture   Final    NO GROWTH 5 DAYS Performed at [facility], [address], [place], [place] [address]    Report Status 11/22/2022 FINAL  Final  Culture, blood (Routine X 2) w Reflex to ID Panel     Status: None   Collection Time: 11/17/22  6:30 PM   Specimen: BLOOD LEFT HAND  Result Value Ref Range Status   Specimen Description BLOOD LEFT HAND  Final   Special Requests   Final    BOTTLES DRAWN AEROBIC ONLY Blood Culture adequate volume   Culture   Final    NO GROWTH 5 DAYS Performed at [facility], [address], [place], [place] [address]    Report Status 11/22/2022 FINAL  Final     Scheduled Meds:  apixaban  5 mg Oral BID   atorvastatin  80 mg Oral Daily   benztropine  0.5 mg Oral Daily   Chlorhexidine Gluconate Cloth  6 each Topical Daily    ciprofloxacin  500 mg Oral BID   divalproex  1,000 mg Oral QHS   divalproex  250 mg Oral Daily   hydroxychloroquine  200 mg Oral BID   insulin aspart  0-5 Units Subcutaneous QHS   insulin aspart  0-9 Units Subcutaneous  TID WC   linezolid  600 mg Oral Q12H   metoprolol succinate  25 mg Oral Daily   pantoprazole  40 mg Oral BID   risperiDONE  1 mg Oral QHS   risperiDONE  6 mg Oral QHS   sertraline  50 mg Oral Daily   traZODone  100 mg Oral QHS    Procedures/Studies: ECHOCARDIOGRAM COMPLETE  Result Date: 11/18/2022    ECHOCARDIOGRAM REPORT   Patient Name:   MAMON MONTANEZ Date of Exam: 11/18/2022 Medical Rec #:  [id number]      Height:       66.0 in Accession #:    [id number]     Weight:       228.4 lb Date of Birth:  July 24, 1966     BSA:          2.116 m Patient Age:    35 years       BP:           105/56 mmHg Patient Gender: M              HR:           91 bpm. Exam Location:  [facility] Procedure: 2D Echo, Cardiac Doppler and Color Doppler Indications:    Bacteremia  History:        Patient has prior history of Echocardiogram examinations, most                 recent 11/13/2020. Cirrhosis; Risk Factors:Diabetes, Current                 Smoker and Dyslipidemia.  Sonographer:    Johny Chess RDCS Referring Phys: [phone] [place]now  Sonographer Comments: Technically difficult study due to poor echo windows. Image acquisition challenging due to respiratory motion. IMPRESSIONS  1. Left ventricular ejection fraction, by estimation, is 60 to 65%. The left ventricle has normal function. Left ventricular endocardial border not optimally defined to evaluate regional wall motion. Left ventricular diastolic parameters were normal.  2. Right ventricular systolic function is normal. The right ventricular size is normal. Tricuspid regurgitation signal is inadequate for assessing PA pressure.  3. Left atrial size was mildly dilated.  4. The mitral valve is grossly normal. Trivial mitral valve regurgitation.  5.  The aortic valve is tricuspid. Aortic valve regurgitation is not visualized.  6. The inferior vena cava is normal in size with greater than 50% respiratory variability, suggesting right atrial pressure of 3 mmHg.  7. No obvious valvular vegetations noted with significantly limited views. Comparison(s): Prior images reviewed side by side. LVEF remains normal range at 60-65%. FINDINGS  Left Ventricle: Left ventricular ejection fraction, by estimation, is 60 to 65%. The left ventricle has normal function. Left ventricular endocardial border not optimally defined to evaluate regional wall motion. The left ventricular internal cavity size was normal in size. There is no left ventricular hypertrophy. Left ventricular diastolic parameters were normal. Right Ventricle: The right ventricular size is normal. No increase in right ventricular wall thickness. Right ventricular systolic function is normal. Tricuspid regurgitation signal is inadequate for assessing PA pressure. Left Atrium: Left atrial size was mildly dilated. Right Atrium: Right atrial size was normal in size. Pericardium: There is no evidence of pericardial effusion. Presence of epicardial fat layer. Mitral Valve: The mitral valve is grossly normal. Trivial mitral valve regurgitation. Tricuspid Valve: The tricuspid valve is grossly normal. Tricuspid valve regurgitation is trivial. Aortic Valve: The aortic valve is tricuspid. There is mild aortic valve  annular calcification. Aortic valve regurgitation is not visualized. Pulmonic Valve: The pulmonic valve was grossly normal. Pulmonic valve regurgitation is trivial. Aorta: The aortic root is normal in size and structure. Venous: The inferior vena cava is normal in size with greater than 50% respiratory variability, suggesting right atrial pressure of 3 mmHg. IAS/Shunts: No atrial level shunt detected by color flow Doppler.  LEFT VENTRICLE PLAX 2D LVIDd:         4.40 cm   Diastology LVIDs:         2.80 cm   LV e'  medial:    9.25 cm/s LV PW:         0.90 cm   LV E/e' medial:  10.5 LV IVS:        0.70 cm   LV e' lateral:   15.30 cm/s LVOT diam:     1.80 cm   LV E/e' lateral: 6.4 LV SV:         54 LV SV Index:   26 LVOT Area:     2.54 cm  RIGHT VENTRICLE             IVC RV S prime:     13.60 cm/s  IVC diam: 1.40 cm TAPSE (M-mode): 4.0 cm LEFT ATRIUM           Index        RIGHT ATRIUM           Index LA diam:      3.00 cm 1.42 cm/m   RA Area:     10.70 cm LA Vol (A4C): 77.8 ml 36.77 ml/m  RA Volume:   23.60 ml  11.15 ml/m  AORTIC VALVE LVOT Vmax:   131.00 cm/s LVOT Vmean:  84.800 cm/s LVOT VTI:    0.214 m  AORTA Ao Root diam: 2.60 cm MITRAL VALVE MV Area (PHT): 3.93 cm    SHUNTS MV Decel Time: 193 msec    Systemic VTI:  0.21 m MV E velocity: 97.40 cm/s  Systemic Diam: 1.80 cm MV A velocity: 99.00 cm/s MV E/A ratio:  0.98 Rozann Lesches MD Electronically signed by Rozann Lesches MD Signature Date/Time: 11/18/2022/2:31:20 PM    Final    CT CHEST ABDOMEN PELVIS W CONTRAST  Result Date: 11/15/2022 CLINICAL DATA:  Sepsis, polytrauma, fall EXAM: CT CHEST, ABDOMEN, AND PELVIS WITH CONTRAST TECHNIQUE: Multidetector CT imaging of the chest, abdomen and pelvis was performed following the standard protocol during bolus administration of intravenous contrast. RADIATION DOSE REDUCTION: This exam was performed according to the departmental dose-optimization program which includes automated exposure control, adjustment of the mA and/or kV according to patient size and/or use of iterative reconstruction technique. CONTRAST:  146m OMNIPAQUE IOHEXOL 300 MG/ML  SOLN COMPARISON:  CT abdomen pelvis, 09/06/2022 CT chest, 04/15/2022 FINDINGS: CT CHEST FINDINGS Cardiovascular: Right chest port catheter. Aortic atherosclerosis. Normal heart size. Left coronary artery calcifications. No pericardial effusion. Mediastinum/Nodes: No enlarged mediastinal, hilar, or axillary lymph nodes. Small incidental diverticulum of the posterior right  trachea at the level of the thoracic inlet (series 2, image 8). Thyroid gland and esophagus demonstrate no significant findings. Lungs/Pleura: Moderate right pleural effusion and associated atelectasis or consolidation. Musculoskeletal: No chest wall abnormality. No acute osseous findings. Status post bilateral shoulder arthroplasty. CT ABDOMEN PELVIS FINDINGS Hepatobiliary: Coarse contour of the liver. No gallstones, gallbladder wall thickening, or biliary dilatation. Pancreas: Unremarkable. No pancreatic ductal dilatation or surrounding inflammatory changes. Spleen: Normal in size without significant abnormality. Adrenals/Urinary Tract: Adrenal glands are unremarkable.  Kidneys are normal, without renal calculi, solid lesion, or hydronephrosis. Malpositioned Foley catheter, tip and balloon within the prostatic or bulbous urethra (series 5, image 111) Stomach/Bowel: Stomach is within normal limits. 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 Vascular/Lymphatic: Aortic atherosclerosis. 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 No enlarged abdominal or pelvic lymph nodes. Reproductive: No mass or other abnormality. Other: No abdominal wall hernia or abnormality. Small volume perihepatic ascites. Musculoskeletal: No acute osseous findings. 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 IMPRESSION: 1. No CT evidence of acute traumatic injury to the chest, abdomen, or pelvis. 2. Moderate right pleural effusion and associated atelectasis or consolidation. 3. Malpositioned Foley catheter, tip and balloon within the prostatic or bulbous urethra. Recommend repositioning. 4. 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 5. Small volume perihepatic ascites. 6. Coarse contour of the liver and gastric varices, suggestive of cirrhosis. 7. Coronary artery disease. Aortic Atherosclerosis (ICD10-I70.0). Electronically Signed   By: Delanna Ahmadi M.D.   On:  11/15/2022 14:32   CT Head Wo Contrast  Result Date: 11/15/2022 CLINICAL DATA:  Three falls in the past 24 hours. EXAM: CT HEAD WITHOUT CONTRAST CT CERVICAL SPINE WITHOUT CONTRAST TECHNIQUE: Multidetector CT imaging of the head and cervical spine was performed following the standard protocol without intravenous contrast. Multiplanar CT image reconstructions of the cervical spine were also generated. RADIATION DOSE REDUCTION: This exam was performed according to the departmental dose-optimization program which includes automated exposure control, adjustment of the mA and/or kV according to patient size and/or use of iterative reconstruction technique. COMPARISON:  CT head 05/16/2021 FINDINGS: CT HEAD FINDINGS Brain: There is no acute intracranial hemorrhage, extra-axial fluid collection, or acute infarct. Parenchymal volume is normal. The ventricles are normal in size. Gray-white differentiation is preserved. The pituitary and suprasellar region are normal. There is no mass lesion. There is no mass effect or midline shift. Vascular: No hyperdense vessel or unexpected calcification. Skull: Normal. Negative for fracture or focal lesion. Sinuses/Orbits: There is mild mucosal thickening in the paranasal sinuses. The globes and orbits are unremarkable. Other: None. CT CERVICAL SPINE FINDINGS Alignment: Normal. There is no jumped or perched facet or other evidence of traumatic malalignment. Skull base and vertebrae: Skull base alignment is maintained. Vertebral body heights are preserved. There is no evidence of acute fracture. There is no suspicious osseous lesion. Soft tissues and spinal canal: No prevertebral fluid or swelling. No visible canal hematoma. Disc levels: Facet arthropathy is most advanced on the right at C3-C4. There is overall mild disc space narrowing at C4-C5 and C5-C6. There is no convincing high-grade spinal canal stenosis. Upper chest: Assessed on the separately dictated CT chest. Other: None.  IMPRESSION: 1. No acute intracranial pathology. 2. No acute fracture or traumatic malalignment of the cervical spine. Electronically Signed   By: Valetta Mole M.D.   On: 11/15/2022 14:24   CT Cervical Spine Wo Contrast  Result Date: 11/15/2022 CLINICAL DATA:  Three falls in the past 24 hours. EXAM: CT HEAD WITHOUT CONTRAST CT CERVICAL SPINE WITHOUT CONTRAST TECHNIQUE: Multidetector CT imaging of the head and cervical spine was performed following the standard protocol without intravenous contrast. Multiplanar CT image reconstructions of the cervical spine were also generated. RADIATION DOSE REDUCTION: This exam was performed according to the departmental dose-optimization program which includes automated exposure control, adjustment of the mA and/or kV according to patient size and/or use of iterative reconstruction technique. COMPARISON:  CT head 05/16/2021 FINDINGS:  CT HEAD FINDINGS Brain: There is no acute intracranial hemorrhage, extra-axial fluid collection, or acute infarct. Parenchymal volume is normal. The ventricles are normal in size. Gray-white differentiation is preserved. The pituitary and suprasellar region are normal. There is no mass lesion. There is no mass effect or midline shift. Vascular: No hyperdense vessel or unexpected calcification. Skull: Normal. Negative for fracture or focal lesion. Sinuses/Orbits: There is mild mucosal thickening in the paranasal sinuses. The globes and orbits are unremarkable. Other: None. CT CERVICAL SPINE FINDINGS Alignment: Normal. There is no jumped or perched facet or other evidence of traumatic malalignment. Skull base and vertebrae: Skull base alignment is maintained. Vertebral body heights are preserved. There is no evidence of acute fracture. There is no suspicious osseous lesion. Soft tissues and spinal canal: No prevertebral fluid or swelling. No visible canal hematoma. Disc levels: Facet arthropathy is most advanced on the right at C3-C4. There is  overall mild disc space narrowing at C4-C5 and C5-C6. There is no convincing high-grade spinal canal stenosis. Upper chest: Assessed on the separately dictated CT chest. Other: None. IMPRESSION: 1. No acute intracranial pathology. 2. No acute fracture or traumatic malalignment of the cervical spine. Electronically Signed   By: Valetta Mole M.D.   On: 11/15/2022 14:24   DG Chest Port 1 View  Result Date: 11/15/2022 CLINICAL DATA:  Sepsis, possible UTI EXAM: PORTABLE CHEST 1 VIEW COMPARISON:  CT chest dated 03/16/2022 FINDINGS: Lungs are clear.  No pleural effusion or pneumothorax. The heart is normal in size. Right chest power port terminates at the cavoatrial junction. Bilateral shoulder arthroplasty. IMPRESSION: No evidence of acute cardiopulmonary disease. Electronically Signed   By: Julian Hy M.D.   On: 11/15/2022 13:35    Barton Dubois, MD  [facility]  If 7PM-7AM, please contact night-coverage [email or web address] Password [email or web address] 11/22/2022, 3:24 PM   LOS: 6 days

## 2022-11-22 NOTE — TOC Progression Note (Signed)
Transition of Care [facility]) - Progression Note    Patient Details  Name: [name] MRN: [id number] Date of Birth: 1966/03/19  [facility] CM/SW Contact  Boneta Lucks, RN Phone Number: 11/22/2022, 4:09 PM  Clinical Narrative:   No bed offers. Added to DTP list.  CM at the bedside with patient. He is mostly answering " yes" to all questions. Not sure he is understanding. Patient is also put bedside phone in his mouth. Patient agreed it was okay to call his sister and they he gets a check and they he will give it up to go to LTC. Patient sent out to more facilities. DTP will follow up tomorrow.     Expected Discharge Plan: [place] Barriers to Discharge: Continued Medical Work up  Expected Discharge Plan and Services         Social Determinants of Health (SDOH) Interventions [place]: Food Insecurity Present (09/07/2022)  Housing: Low Risk  (09/07/2022)  Transportation Needs: No Transportation Needs (09/07/2022)  Utilities: Not At Risk (09/07/2022)  Tobacco Use: High Risk (11/15/2022)    Readmission Risk Interventions     No data to display

## 2022-11-22 NOTE — Progress Notes (Signed)
Pt slept through the night. DBP and MAP low.

## 2022-11-23 DIAGNOSIS — Z86718 Personal history of other venous thrombosis and embolism: Secondary | ICD-10-CM | POA: Diagnosis not present

## 2022-11-23 DIAGNOSIS — J90 Pleural effusion, not elsewhere classified: Secondary | ICD-10-CM | POA: Diagnosis not present

## 2022-11-23 DIAGNOSIS — K50819 Crohn's disease of both small and large intestine with unspecified complications: Secondary | ICD-10-CM | POA: Diagnosis not present

## 2022-11-23 DIAGNOSIS — E876 Hypokalemia: Secondary | ICD-10-CM | POA: Diagnosis not present

## 2022-11-23 LAB — GLUCOSE, CAPILLARY
Glucose-Capillary: 69 mg/dL — ABNORMAL LOW (ref 70–99)
Glucose-Capillary: 71 mg/dL (ref 70–99)
Glucose-Capillary: 118 mg/dL — ABNORMAL HIGH (ref 70–99)
Glucose-Capillary: 101 mg/dL — ABNORMAL HIGH (ref 70–99)
Glucose-Capillary: 111 mg/dL — ABNORMAL HIGH (ref 70–99)

## 2022-11-23 NOTE — Progress Notes (Signed)
PROGRESS NOTE  [name] [id number] DOB: 01-24-1966 DOA: 11/15/2022 PCP: [facility]  Brief History:  57 year old male with a history of diabetes mellitus type 2, pancytopenia, recurrent DVT/PE on Eliquis, RA, Crohn's colitis status post colectomy with ileostomy 2009 presenting with multiple falls.  The patient is a poor historian. Patient was seen on 05 November 2022 by liver transplant at [facility].  He was noted to have nonalcoholic fatty liver disease along with cirrhosis.  Autoimmune workup was started.  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  Has not had follow-up yet.   Patient been taking Demadex 20 mg a day prescribed by his cardiologist due to lower extremity edema.  Patient has not been started on Aldactone yet. This was mentioned during his hepatology consult. Patient states that he is taking Aldactone but there is no recording of this in his chart.   The patient was recently hospitalized from 09/06/2022 to 09/09/2022 for GI bleed.  EGD  09/07/22 with 1cm hh, Erythematous mucosa in the gastric fundus. Treated w/ (APC).GAVE w/ bleeding. Treated w/ (APC). Erythematous duodenopathy.  The patient was discharged home with pantoprazole twice daily.  In the ED, the patient was noted to be tachycardic in the 110s.  He was hypotensive with systolic blood pressure 83.  The patient was given 3.5 L of fluid.  Lactic acid was 3.7.  He was admitted for further evaluation and treatment of SIRS and altered mentation    Assessment/Plan: Frequent falls/failure to thrive -TSH, B12, folate and ammonia within normal limits -Continue supportive care -Continue to follow clinical response. -Physical therapy has recommended skilled nursing facility for rehab at discharge -[facility] assisting with discharge plans.  Generalized weakness -Multifactorial including deconditioning, hypokalemia, possible infectious process. -Following recommendations by  infectious disease service will treat for 2 weeks using oral antibiotics (ciprofloxacin and Zyvox). -Follow physical therapy evaluation/recommendations. -Currently with recommendation for a skilled nursing facility at discharge once medically stable.  Urinary retention -Foley catheter placed in the ED -CT shows tip of the catheter is in the prostatic urethra -Requested nursing to advance/adjust foley -Continue monitoring urine output -Patient denying dysuria and suprapubic tenderness at this moment. -Patient will benefit of outpatient follow-up with urology service; -plan is for patient to be discharged with Foley catheter in place (looking for 10 days or so; if still hospitalized, will touch basis with urology).  Acute metabolic encephalopathy -Patient mentation slowly improving. -Ammonia 30 -CT brain negative -UA negative for pyuria -Positive bacteremia as mentioned below -Continue current antibiotics and supportive care..  Lactic acidosis/enterococcal and Acinetobacter bacteremia -Suspect this is due to decreased clearance from his hepatic cirrhosis, versus associated with active infection. -Following ID recommendations 2D echo performed; no vegetations appreciated.  Holding on TEE at this moment -Based on cultures sensitivity antibiotics will be narrow to Cipro and Zyvox with plans for 2 weeks of therapy.  Hypokalemia -Repleted and is stable currently. -Magnesium within normal limits -Continue to follow electrolytes trend.  Mild hyponatremia -Most likely associated with chronic cirrhosis and dehydration -Improved and pretty much back to baseline after fluid resuscitation provided -Continue to follow electrolytes trend.  Liver cirrhosis secondary to nonalcoholic steatohepatitis (NASH)  -Pt was seen by transplant hepatology at [facility] on 11-05-2022. He has outpatient labs that showed + ANA and + anti-smooth muscle antibody.  -This calls into question whether his cirrhosis is caused  by autoimmune hepatitis. -Continue outpatient follow-up with GI service -  LFTs appears to be stable. -Continue holding Aldactone and Demadex for now.  Crohn's disease of both small and large intestine with complication ([place]) -Pt with ileostomy.  -Pt denies any high output from ileostomy -Continue to maintain adequate hydration.  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  Bipolar -continue depakote, risperdal, sertraline, benztropine -Overall mood is stable currently. -Following commands appropriately.  Mixed Hyperlipidemia -continue statin -Heart healthy diet discussed with patient.  History DVTs/PE -Resume use of Eliquis for secondary prevention.  Pancytopenia -chronic. -Continue patient follow-up with hematology service. -Continue intermittent monitoring of patient's hemoglobin and platelet count. -Hemoglobin has remained stable.   -No overt bleeding appreciated.  Class 2 Obese -BMI 37.04 -Low-calorie diet and portion control discussed with patient.   Family Communication:   None family at bedside.  Unable to reach patient's sister who lives out of state on available phone number.  Consultants: Infectious disease.  Code Status:  FULL   DVT Prophylaxis:  SCDs   Procedures: As Listed in Progress Note Above  Antibiotics: Ciprofloxacin and Zyvox  Subjective: Chronically ill in appearance; demonstrating generalized weakness and deconditioning.  No chest pain, no nausea, no vomiting, afebrile.  No overnight events.   Objective: Vitals:   11/23/22 0425 11/23/22 0500 11/23/22 0919 11/23/22 1243  BP: (!) 88/59   (!) 92/51  Pulse: 73  80 82  Resp: 18   16  Temp: 97.9 F (36.6 C)   (!) 97.5 F (36.4 C)  TempSrc:    Oral  SpO2: 94%   99%  Weight:  104.9 kg    Height:        Intake/Output Summary (Last 24 hours) at 11/23/2022 1606 Last data filed at 11/23/2022 1300 Gross  per 24 hour  Intake 580 ml  Output 575 ml  Net 5 ml   Weight change: -8.7 kg  Exam: General exam: Alert, awake, following commands appropriately; no overnight events.  Afebrile.  Reports no nausea or vomiting. Respiratory system: Clear to auscultation. Respiratory effort normal.  No using accessory muscle.  Good saturation on room air. Cardiovascular system:RRR. No rubs or gallops; no JVD. Gastrointestinal system: Abdomen is obese, nondistended, soft and nontender.  Positive bowel sounds.  Ostomy bag in place. Central nervous system: No focal neurological deficits. Extremities: No cyanosis or clubbing; 1+ edema appreciated bilaterally. Skin: No petechiae. GU: foley catheter in place. Psychiatry: Mood & affect appropriate.    Data Reviewed: I have personally reviewed following labs and imaging studies  Basic Metabolic Panel: Recent Labs  Lab 11/17/22 0412 11/18/22 0408 11/20/22 0517 11/22/22 0508  NA 133* 131* 131* 131*  K 4.8 4.5 4.8 4.6  CL 106 104 101 104  CO2 24 24 22 $ 21*  GLUCOSE 77 87 68* 104*  BUN 14 16 22* 26*  CREATININE 1.02 0.93 1.11 1.10  CALCIUM 7.7* 7.9* 7.8* 7.9*  MG 2.0  --   --   --     Recent Labs  Lab 11/17/22 0412  AMMONIA 66*   CBC: Recent Labs  Lab 11/17/22 0412 11/18/22 0408 11/20/22 0517 11/22/22 1114  WBC 3.8* 4.4 5.7 5.4  HGB 7.2* 7.4* 7.9* 9.0*  HCT 22.9* 22.7* 26.3* 31.3*  MCV 104.6* 103.7* 111.4* 114.7*  PLT 47* 59* 79* 75*   Cardiac Enzymes: No results for input(s): "CKTOTAL", "CKMB", "CKMBINDEX", "TROPONINI" in the last 168 hours.  CBG: Recent Labs  Lab 11/22/22 1653 11/22/22 2206 11/23/22 0726 11/23/22  [id number] 11/23/22 1116  GLUCAP 99 90 69* 71 118*   Urine analysis:    Component Value Date/Time   COLORURINE YELLOW 11/15/2022 [place] 11/15/2022 1327   APPEARANCEUR Cloudy (A) 08/25/2022 1417   LABSPEC 1.012 11/15/2022 1327   PHURINE 6.0 11/15/2022 1327   GLUCOSEU NEGATIVE 11/15/2022 1327    HGBUR NEGATIVE 11/15/2022 [place] 11/15/2022 1327   BILIRUBINUR Negative 08/25/2022 1417   KETONESUR NEGATIVE 11/15/2022 1327   PROTEINUR NEGATIVE 11/15/2022 1327   UROBILINOGEN 0.2 10/17/2014 1940   NITRITE NEGATIVE 11/15/2022 1327   LEUKOCYTESUR NEGATIVE 11/15/2022 1327   Sepsis Labs:  Recent Results (from the past 240 hour(s))  Blood Culture (routine x 2)     Status: Abnormal   Collection Time: 11/15/22 12:23 PM   Specimen: BLOOD RIGHT HAND  Result Value Ref Range Status   Specimen Description   Final    BLOOD RIGHT HAND Performed at [facility], [address] [place], [place] [address]    Special Requests   Final    BOTTLES DRAWN AEROBIC AND ANAEROBIC Blood Culture adequate volume Performed at [facility], [address] [place], [place] [address]    Culture  Setup Time   Final    GRAM POSITIVE COCCI BOTTLES DRAWN AEROBIC AND ANAEROBIC Gram Stain Report Called to,Read Back By and Verified With: [email or web address],[email or web address]$  BY MATTHEWS B 2.13.2024 CRITICAL RESULT CALLED TO, READ BACK BY AND VERIFIED WITH: PHARMD FRANK WILSON  ON 11/16/22 @ 1444 BY DRT Performed at [facility], [place] [address] [place], [place] [address]    Culture (A)  Final    ACINETOBACTER CALCOACETICUS/BAUMANNII COMPLEX ENTEROCOCCUS FAECALIS    Report Status 11/18/2022 FINAL  Final   Organism ID, Bacteria ACINETOBACTER CALCOACETICUS/BAUMANNII COMPLEX  Final   Organism ID, Bacteria ENTEROCOCCUS FAECALIS  Final      Susceptibility   Acinetobacter calcoaceticus/baumannii complex - MIC*    CEFTAZIDIME 8 SENSITIVE Sensitive     CIPROFLOXACIN 1 SENSITIVE Sensitive     GENTAMICIN 8 INTERMEDIATE Intermediate     IMIPENEM <=0.25 SENSITIVE Sensitive     PIP/TAZO <=4 SENSITIVE Sensitive     TRIMETH/SULFA <=20 SENSITIVE Sensitive     AMPICILLIN/SULBACTAM <=2 SENSITIVE Sensitive     * ACINETOBACTER CALCOACETICUS/BAUMANNII COMPLEX   Enterococcus faecalis - MIC*    AMPICILLIN <=2 SENSITIVE Sensitive      VANCOMYCIN 1 SENSITIVE Sensitive     GENTAMICIN SYNERGY RESISTANT Resistant     * ENTEROCOCCUS FAECALIS  Blood Culture ID Panel (Reflexed)     Status: Abnormal   Collection Time: 11/15/22 12:23 PM  Result Value Ref Range Status   Enterococcus faecalis NOT DETECTED NOT DETECTED Final   Enterococcus Faecium NOT DETECTED NOT DETECTED Final   Listeria monocytogenes NOT DETECTED NOT DETECTED Final   Staphylococcus species NOT DETECTED NOT DETECTED Final   Staphylococcus aureus (BCID) NOT DETECTED NOT DETECTED Final   Staphylococcus epidermidis NOT DETECTED NOT DETECTED Final   Staphylococcus lugdunensis NOT DETECTED NOT DETECTED Final   Streptococcus species NOT DETECTED NOT DETECTED Final   Streptococcus agalactiae NOT DETECTED NOT DETECTED Final   Streptococcus pneumoniae NOT DETECTED NOT DETECTED Final   Streptococcus pyogenes NOT DETECTED NOT DETECTED Final   A.calcoaceticus-baumannii DETECTED (A) NOT DETECTED Final    Comment: CRITICAL RESULT CALLED TO, READ BACK BY AND VERIFIED WITH: PHARMD FRANK WILSON  ON 11/16/22 @ 1444 BY DRT    Bacteroides fragilis NOT DETECTED NOT DETECTED Final   Enterobacterales NOT  DETECTED NOT DETECTED Final   Enterobacter cloacae complex NOT DETECTED NOT DETECTED Final   Escherichia coli NOT DETECTED NOT DETECTED Final   Klebsiella aerogenes NOT DETECTED NOT DETECTED Final   Klebsiella oxytoca NOT DETECTED NOT DETECTED Final   Klebsiella pneumoniae NOT DETECTED NOT DETECTED Final   Proteus species NOT DETECTED NOT DETECTED Final   Salmonella species NOT DETECTED NOT DETECTED Final   Serratia marcescens NOT DETECTED NOT DETECTED Final   Haemophilus influenzae NOT DETECTED NOT DETECTED Final   Neisseria meningitidis NOT DETECTED NOT DETECTED Final   Pseudomonas aeruginosa NOT DETECTED NOT DETECTED Final   Stenotrophomonas maltophilia NOT DETECTED NOT DETECTED Final   Candida albicans NOT DETECTED NOT DETECTED Final   Candida auris NOT DETECTED NOT  DETECTED Final   Candida glabrata NOT DETECTED NOT DETECTED Final   Candida krusei NOT DETECTED NOT DETECTED Final   Candida parapsilosis NOT DETECTED NOT DETECTED Final   Candida tropicalis NOT DETECTED NOT DETECTED Final   Cryptococcus neoformans/gattii NOT DETECTED NOT DETECTED Final   CTX-M ESBL NOT DETECTED NOT DETECTED Final   Carbapenem resistance IMP NOT DETECTED NOT DETECTED Final   Carbapenem resistance KPC NOT DETECTED NOT DETECTED Final   Carbapenem resistance NDM NOT DETECTED NOT DETECTED Final   Carbapenem resistance VIM NOT DETECTED NOT DETECTED Final    Comment: Performed at [facility], [address], [place], [place] [address]  Blood Culture (routine x 2)     Status: None   Collection Time: 11/15/22 12:28 PM   Specimen: BLOOD LEFT HAND  Result Value Ref Range Status   Specimen Description BLOOD LEFT HAND  Final   Special Requests   Final    BOTTLES DRAWN AEROBIC ONLY Blood Culture results may not be optimal due to an inadequate volume of blood received in culture bottles   Culture   Final    NO GROWTH 5 DAYS Performed at [facility], [address], [place], [place] [address]    Report Status 11/20/2022 FINAL  Final  Resp panel by RT-PCR (RSV, Flu A&B, Covid) Anterior Nasal Swab     Status: None   Collection Time: 11/15/22  1:28 PM   Specimen: Anterior Nasal Swab  Result Value Ref Range Status   SARS Coronavirus 2 by RT PCR NEGATIVE NEGATIVE Final    Comment: (NOTE) SARS-CoV-2 target nucleic acids are NOT DETECTED.  The SARS-CoV-2 RNA is generally detectable in upper respiratory specimens during the acute phase of infection. The lowest concentration of SARS-CoV-2 viral copies this assay can detect is 138 copies/mL. A negative result does not preclude SARS-Cov-2 infection and should not be used as the sole basis for treatment or other patient management decisions. A negative result may occur with  improper specimen collection/handling, submission of  specimen other than nasopharyngeal swab, presence of viral mutation(s) within the areas targeted by this assay, and inadequate number of viral copies(<138 copies/mL). A negative result must be combined with clinical observations, patient history, and epidemiological information. The expected result is Negative.  Fact Sheet for Patients:  EntrepreneurPulse.com.au  Fact Sheet for Healthcare Providers:  IncredibleEmployment.be  This test is no t yet approved or cleared by the [place] FDA and  has been authorized for detection and/or diagnosis of SARS-CoV-2 by FDA under an Emergency Use Authorization (EUA). This EUA will remain  in effect (meaning this test can be used) for the duration of the COVID-19 declaration under Section 564(b)(1) of the Act, 21 U.S.C.section 360bbb-3(b)(1), unless the authorization  is terminated  or revoked sooner.       Influenza A by PCR NEGATIVE NEGATIVE Final   Influenza B by PCR NEGATIVE NEGATIVE Final    Comment: (NOTE) The Xpert Xpress SARS-CoV-2/FLU/RSV plus assay is intended as an aid in the diagnosis of influenza from Nasopharyngeal swab specimens and should not be used as a sole basis for treatment. Nasal washings and aspirates are unacceptable for Xpert Xpress SARS-CoV-2/FLU/RSV testing.  Fact Sheet for Patients: EntrepreneurPulse.com.au  Fact Sheet for Healthcare Providers: IncredibleEmployment.be  This test is not yet approved or cleared by the [place] FDA and has been authorized for detection and/or diagnosis of SARS-CoV-2 by FDA under an Emergency Use Authorization (EUA). This EUA will remain in effect (meaning this test can be used) for the duration of the COVID-19 declaration under Section 564(b)(1) of the Act, 21 U.S.C. section 360bbb-3(b)(1), unless the authorization is terminated or revoked.     Resp Syncytial Virus by PCR NEGATIVE NEGATIVE Final     Comment: (NOTE) Fact Sheet for Patients: EntrepreneurPulse.com.au  Fact Sheet for Healthcare Providers: IncredibleEmployment.be  This test is not yet approved or cleared by the [place] FDA and has been authorized for detection and/or diagnosis of SARS-CoV-2 by FDA under an Emergency Use Authorization (EUA). This EUA will remain in effect (meaning this test can be used) for the duration of the COVID-19 declaration under Section 564(b)(1) of the Act, 21 U.S.C. section 360bbb-3(b)(1), unless the authorization is terminated or revoked.  Performed at [facility], [address] [place], [place] [address]   Culture, blood (Routine X 2) w Reflex to ID Panel     Status: None   Collection Time: 11/17/22  4:52 PM   Specimen: BLOOD RIGHT HAND  Result Value Ref Range Status   Specimen Description BLOOD RIGHT HAND  Final   Special Requests   Final    BOTTLES DRAWN AEROBIC AND ANAEROBIC Blood Culture results may not be optimal due to an inadequate volume of blood received in culture bottles   Culture   Final    NO GROWTH 5 DAYS Performed at [facility], [address] [place], [place] [address]    Report Status 11/22/2022 FINAL  Final  Culture, blood (Routine X 2) w Reflex to ID Panel     Status: None   Collection Time: 11/17/22  6:30 PM   Specimen: BLOOD LEFT HAND  Result Value Ref Range Status   Specimen Description BLOOD LEFT HAND  Final   Special Requests   Final    BOTTLES DRAWN AEROBIC ONLY Blood Culture adequate volume   Culture   Final    NO GROWTH 5 DAYS Performed at [facility], [address], [place], [place] [address]    Report Status 11/22/2022 FINAL  Final     Scheduled Meds:  apixaban  5 mg Oral BID   atorvastatin  80 mg Oral Daily   benztropine  0.5 mg Oral Daily   Chlorhexidine Gluconate Cloth  6 each Topical Daily   ciprofloxacin  500 mg Oral BID   divalproex  1,000 mg Oral QHS   divalproex  250 mg Oral Daily    hydroxychloroquine  200 mg Oral BID   insulin aspart  0-5 Units Subcutaneous QHS   insulin aspart  0-9 Units Subcutaneous TID WC   linezolid  600 mg Oral Q12H   metoprolol succinate  25 mg Oral Daily   pantoprazole  40 mg Oral BID   risperiDONE  1 mg Oral QHS  risperiDONE  6 mg Oral QHS   sertraline  50 mg Oral Daily   traZODone  100 mg Oral QHS    Procedures/Studies: ECHOCARDIOGRAM COMPLETE  Result Date: 11/18/2022    ECHOCARDIOGRAM REPORT   Patient Name:   [name] Date of Exam: 11/18/2022 Medical Rec #:  [id number]      Height:       66.0 in Accession #:    [id number]     Weight:       228.4 lb Date of Birth:  Mar 31, 1966     BSA:          2.116 m Patient Age:    4 years       BP:           105/56 mmHg Patient Gender: M              HR:           91 bpm. Exam Location:  [facility] Procedure: 2D Echo, Cardiac Doppler and Color Doppler Indications:    Bacteremia  History:        Patient has prior history of Echocardiogram examinations, most                 recent 11/13/2020. Cirrhosis; Risk Factors:Diabetes, Current                 Smoker and Dyslipidemia.  Sonographer:    Johny Chess RDCS Referring Phys: [phone] Mckinzy Fuller  Sonographer Comments: Technically difficult study due to poor echo windows. Image acquisition challenging due to respiratory motion. IMPRESSIONS  1. Left ventricular ejection fraction, by estimation, is 60 to 65%. The left ventricle has normal function. Left ventricular endocardial border not optimally defined to evaluate regional wall motion. Left ventricular diastolic parameters were normal.  2. Right ventricular systolic function is normal. The right ventricular size is normal. Tricuspid regurgitation signal is inadequate for assessing PA pressure.  3. Left atrial size was mildly dilated.  4. The mitral valve is grossly normal. Trivial mitral valve regurgitation.  5. The aortic valve is tricuspid. Aortic valve regurgitation is not visualized.  6. The inferior vena  cava is normal in size with greater than 50% respiratory variability, suggesting right atrial pressure of 3 mmHg.  7. No obvious valvular vegetations noted with significantly limited views. Comparison(s): Prior images reviewed side by side. LVEF remains normal range at 60-65%. FINDINGS  Left Ventricle: Left ventricular ejection fraction, by estimation, is 60 to 65%. The left ventricle has normal function. Left ventricular endocardial border not optimally defined to evaluate regional wall motion. The left ventricular internal cavity size was normal in size. There is no left ventricular hypertrophy. Left ventricular diastolic parameters were normal. Right Ventricle: The right ventricular size is normal. No increase in right ventricular wall thickness. Right ventricular systolic function is normal. Tricuspid regurgitation signal is inadequate for assessing PA pressure. Left Atrium: Left atrial size was mildly dilated. Right Atrium: Right atrial size was normal in size. Pericardium: There is no evidence of pericardial effusion. Presence of epicardial fat layer. Mitral Valve: The mitral valve is grossly normal. Trivial mitral valve regurgitation. Tricuspid Valve: The tricuspid valve is grossly normal. Tricuspid valve regurgitation is trivial. Aortic Valve: The aortic valve is tricuspid. There is mild aortic valve annular calcification. Aortic valve regurgitation is not visualized. Pulmonic Valve: The pulmonic valve was grossly normal. Pulmonic valve regurgitation is trivial. Aorta: The aortic root is normal in size and structure. Venous: The inferior vena cava is  normal in size with greater than 50% respiratory variability, suggesting right atrial pressure of 3 mmHg. IAS/Shunts: No atrial level shunt detected by color flow Doppler.  LEFT VENTRICLE PLAX 2D LVIDd:         4.40 cm   Diastology LVIDs:         2.80 cm   LV e' medial:    9.25 cm/s LV PW:         0.90 cm   LV E/e' medial:  10.5 LV IVS:        0.70 cm   LV e'  lateral:   15.30 cm/s LVOT diam:     1.80 cm   LV E/e' lateral: 6.4 LV SV:         54 LV SV Index:   26 LVOT Area:     2.54 cm  RIGHT VENTRICLE             IVC RV S prime:     13.60 cm/s  IVC diam: 1.40 cm TAPSE (M-mode): 4.0 cm LEFT ATRIUM           Index        RIGHT ATRIUM           Index LA diam:      3.00 cm 1.42 cm/m   RA Area:     10.70 cm LA Vol (A4C): 77.8 ml 36.77 ml/m  RA Volume:   23.60 ml  11.15 ml/m  AORTIC VALVE LVOT Vmax:   131.00 cm/s LVOT Vmean:  84.800 cm/s LVOT VTI:    0.214 m  AORTA Ao Root diam: 2.60 cm MITRAL VALVE MV Area (PHT): 3.93 cm    SHUNTS MV Decel Time: 193 msec    Systemic VTI:  0.21 m MV E velocity: 97.40 cm/s  Systemic Diam: 1.80 cm MV A velocity: 99.00 cm/s MV E/A ratio:  0.98 Rozann Lesches MD Electronically signed by Rozann Lesches MD Signature Date/Time: 11/18/2022/2:31:20 PM    Final    CT CHEST ABDOMEN PELVIS W CONTRAST  Result Date: 11/15/2022 CLINICAL DATA:  Sepsis, polytrauma, fall EXAM: CT CHEST, ABDOMEN, AND PELVIS WITH CONTRAST TECHNIQUE: Multidetector CT imaging of the chest, abdomen and pelvis was performed following the standard protocol during bolus administration of intravenous contrast. RADIATION DOSE REDUCTION: This exam was performed according to the departmental dose-optimization program which includes automated exposure control, adjustment of the mA and/or kV according to patient size and/or use of iterative reconstruction technique. CONTRAST:  172m OMNIPAQUE IOHEXOL 300 MG/ML  SOLN COMPARISON:  CT abdomen pelvis, 09/06/2022 CT chest, 04/15/2022 FINDINGS: CT CHEST FINDINGS Cardiovascular: Right chest port catheter. Aortic atherosclerosis. Normal heart size. Left coronary artery calcifications. No pericardial effusion. Mediastinum/Nodes: No enlarged mediastinal, hilar, or axillary lymph nodes. Small incidental diverticulum of the posterior right trachea at the level of the thoracic inlet (series 2, image 8). Thyroid gland and esophagus demonstrate  no significant findings. Lungs/Pleura: Moderate right pleural effusion and associated atelectasis or consolidation. Musculoskeletal: No chest wall abnormality. No acute osseous findings. Status post bilateral shoulder arthroplasty. CT ABDOMEN PELVIS FINDINGS Hepatobiliary: Coarse contour of the liver. No gallstones, gallbladder wall thickening, or biliary dilatation. Pancreas: Unremarkable. No pancreatic ductal dilatation or surrounding inflammatory changes. Spleen: Normal in size without significant abnormality. Adrenals/Urinary Tract: Adrenal glands are unremarkable. Kidneys are normal, without renal calculi, solid lesion, or hydronephrosis. Malpositioned Foley catheter, tip and balloon within the prostatic or bulbous urethra (series 5, image 111) Stomach/Bowel: Stomach is within normal limits. Status post total colectomy with  right lower quadrant end ileostomy. Vascular/Lymphatic: Aortic atherosclerosis. 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 No enlarged abdominal or pelvic lymph nodes. Reproductive: No mass or other abnormality. Other: No abdominal wall hernia or abnormality. Small volume perihepatic ascites. Musculoskeletal: No acute osseous findings. 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 IMPRESSION: 1. No CT evidence of acute traumatic injury to the chest, abdomen, or pelvis. 2. Moderate right pleural effusion and associated atelectasis or consolidation. 3. Malpositioned Foley catheter, tip and balloon within the prostatic or bulbous urethra. Recommend repositioning. 4. 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 5. Small volume perihepatic ascites. 6. Coarse contour of the liver and gastric varices, suggestive of cirrhosis. 7. Coronary artery disease. Aortic Atherosclerosis (ICD10-I70.0). Electronically Signed   By: Delanna Ahmadi M.D.   On: 11/15/2022 14:32   CT Head Wo Contrast  Result Date: 11/15/2022 CLINICAL DATA:  Three falls in the  past 24 hours. EXAM: CT HEAD WITHOUT CONTRAST CT CERVICAL SPINE WITHOUT CONTRAST TECHNIQUE: Multidetector CT imaging of the head and cervical spine was performed following the standard protocol without intravenous contrast. Multiplanar CT image reconstructions of the cervical spine were also generated. RADIATION DOSE REDUCTION: This exam was performed according to the departmental dose-optimization program which includes automated exposure control, adjustment of the mA and/or kV according to patient size and/or use of iterative reconstruction technique. COMPARISON:  CT head 05/16/2021 FINDINGS: CT HEAD FINDINGS Brain: There is no acute intracranial hemorrhage, extra-axial fluid collection, or acute infarct. Parenchymal volume is normal. The ventricles are normal in size. Gray-white differentiation is preserved. The pituitary and suprasellar region are normal. There is no mass lesion. There is no mass effect or midline shift. Vascular: No hyperdense vessel or unexpected calcification. Skull: Normal. Negative for fracture or focal lesion. Sinuses/Orbits: There is mild mucosal thickening in the paranasal sinuses. The globes and orbits are unremarkable. Other: None. CT CERVICAL SPINE FINDINGS Alignment: Normal. There is no jumped or perched facet or other evidence of traumatic malalignment. Skull base and vertebrae: Skull base alignment is maintained. Vertebral body heights are preserved. There is no evidence of acute fracture. There is no suspicious osseous lesion. Soft tissues and spinal canal: No prevertebral fluid or swelling. No visible canal hematoma. Disc levels: Facet arthropathy is most advanced on the right at C3-C4. There is overall mild disc space narrowing at C4-C5 and C5-C6. There is no convincing high-grade spinal canal stenosis. Upper chest: Assessed on the separately dictated CT chest. Other: None. IMPRESSION: 1. No acute intracranial pathology. 2. No acute fracture or traumatic malalignment of the  cervical spine. Electronically Signed   By: Valetta Mole M.D.   On: 11/15/2022 14:24   CT Cervical Spine Wo Contrast  Result Date: 11/15/2022 CLINICAL DATA:  Three falls in the past 24 hours. EXAM: CT HEAD WITHOUT CONTRAST CT CERVICAL SPINE WITHOUT CONTRAST TECHNIQUE: Multidetector CT imaging of the head and cervical spine was performed following the standard protocol without intravenous contrast. Multiplanar CT image reconstructions of the cervical spine were also generated. RADIATION DOSE REDUCTION: This exam was performed according to the departmental dose-optimization program which includes automated exposure control, adjustment of the mA and/or kV according to patient size and/or use of iterative reconstruction technique. COMPARISON:  CT head 05/16/2021 FINDINGS: CT HEAD FINDINGS Brain: There is no acute intracranial hemorrhage, extra-axial fluid collection, or acute infarct. Parenchymal volume is normal. The ventricles are normal in size. Gray-white differentiation is preserved. The pituitary and suprasellar region are normal.  There is no mass lesion. There is no mass effect or midline shift. Vascular: No hyperdense vessel or unexpected calcification. Skull: Normal. Negative for fracture or focal lesion. Sinuses/Orbits: There is mild mucosal thickening in the paranasal sinuses. The globes and orbits are unremarkable. Other: None. CT CERVICAL SPINE FINDINGS Alignment: Normal. There is no jumped or perched facet or other evidence of traumatic malalignment. Skull base and vertebrae: Skull base alignment is maintained. Vertebral body heights are preserved. There is no evidence of acute fracture. There is no suspicious osseous lesion. Soft tissues and spinal canal: No prevertebral fluid or swelling. No visible canal hematoma. Disc levels: Facet arthropathy is most advanced on the right at C3-C4. There is overall mild disc space narrowing at C4-C5 and C5-C6. There is no convincing high-grade spinal canal  stenosis. Upper chest: Assessed on the separately dictated CT chest. Other: None. IMPRESSION: 1. No acute intracranial pathology. 2. No acute fracture or traumatic malalignment of the cervical spine. Electronically Signed   By: Valetta Mole M.D.   On: 11/15/2022 14:24   DG Chest Port 1 View  Result Date: 11/15/2022 CLINICAL DATA:  Sepsis, possible UTI EXAM: PORTABLE CHEST 1 VIEW COMPARISON:  CT chest dated 03/16/2022 FINDINGS: Lungs are clear.  No pleural effusion or pneumothorax. The heart is normal in size. Right chest power port terminates at the cavoatrial junction. Bilateral shoulder arthroplasty. IMPRESSION: No evidence of acute cardiopulmonary disease. Electronically Signed   By: Julian Hy M.D.   On: 11/15/2022 13:35    Barton Dubois, MD  [facility]  If 7PM-7AM, please contact night-coverage [email or web address] Password [email or web address] 11/23/2022, 4:06 PM   LOS: 7 days

## 2022-11-23 NOTE — Progress Notes (Signed)
Mobility Specialist Progress Note:    11/23/22 1417  Mobility  Activity Dangled on edge of bed  Level of Assistance Moderate assist, patient does 50-74%  Assistive Device None  Activity Response Tolerated well  Mobility Referral Yes  $Mobility charge 1 Mobility   Pt was agreeable to mobility session. Audibly SOB throughout session. Pt was able to perform 5 reps of Leg raised per side d/t not being able to transfer to chair. Returned pt to bed with alarm on, all needs met.   Royetta Crochet Mobility Specialist Please contact via Solicitor or  Rehab office at [phone]

## 2022-11-23 NOTE — TOC Progression Note (Addendum)
Transition of Care [facility]) - Progression Note    Patient Details  Name: PANAYIOTIS MIRELEZ MRN: [id number] Date of Birth: 05/07/1966  Transition of Care [facility]) CM/SW Contact  Boneta Lucks, RN Phone Number: 11/23/2022, 3:46 PM  Clinical Narrative:   Burman Nieves his nurse with [employer], this service provided through his [facility]. He is normally alert and oriented x 4. Medically and psy stable and able to to care for himself with assistance of and aide 16 hours a week. He uses transportation through [place], and medication management with bubble wrapped meds.  She is concerns, this in an acute change and he is not at his baseline. She states he also has a  payee through [place]. She will get that information for TOC. She does not feel he needs LTC. They can increase his time at home. Would like for him to be medically stable to return home after SNF.    Addendum -  Maggie called back to update. Grace Blight  [phone] is his Payee through [place].  Expected Discharge Plan: Long Term Acute Care (LTAC) Barriers to Discharge: Continued Medical Work up  Expected Discharge Plan and Services      Social Determinants of Health (SDOH) Interventions [place]: Food Insecurity Present (09/07/2022)  Housing: Low Risk  (09/07/2022)  Transportation Needs: No Transportation Needs (09/07/2022)  Utilities: Not At Risk (09/07/2022)  Tobacco Use: High Risk (11/15/2022)    Readmission Risk Interventions     No data to display

## 2022-11-23 NOTE — TOC Progression Note (Signed)
Transition of Care [facility]) - Progression Note    Patient Details  Name: [name] MRN: [id number] Date of Birth: 1966-03-25  [facility]) CM/SW Contact  Boneta Lucks, RN Phone Number: 11/23/2022, 10:40 AM  Clinical Narrative:   Calls made to [facility] to review referral. CM spoke with his sister. She lives out of town and just buried her other brother yesterday. Patient does get a check, she does not know how much. She will make some calls to check on his cat and his apartment. The lady next door checks in on him and his cat. She states he is usually alert and answers questions appropriately. TOC and DTP following.    Expected Discharge Plan: Long Term Acute Care (LTAC) Barriers to Discharge: Continued Medical Work up  Expected Discharge Plan and Services    LTC  Social Determinants of Health (SDOH) Interventions [place]: Food Insecurity Present (09/07/2022)  Housing: Low Risk  (09/07/2022)  Transportation Needs: No Transportation Needs (09/07/2022)  Utilities: Not At Risk (09/07/2022)  Tobacco Use: High Risk (11/15/2022)    Readmission Risk Interventions     No data to display

## 2022-11-23 NOTE — Progress Notes (Signed)
Physical Therapy Treatment Patient Details Name: [name] MRN: [id number] DOB: 1966/08/01 Today's Date: 11/23/2022   History of Present Illness 57 year old male history of type 2 diabetes, Crohn's disease status post ileostomy, history of recurrent DVT/PE on Eliquis, bipolar disorder who presents to the ER today with multiple falls in the last 24 hours.  Patient is a very poor historian.  He is confused.  He is not oriented to time.  Patient giving confusing answers to his questions.  Instead of stating he does not know the answer the question, patient is making up answers.     Patient was seen on 05 November 2022 by liver transplant at [facility].  He was noted to have nonalcoholic fatty liver disease along with cirrhosis.  Autoimmune workup was started.  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  Has not had follow-up yet.    PT Comments    Patient agreeable for therapy, but requires repeated verbal/tactile cueing for following directions.  Patient demonstrates slow labored movement for sitting up at bedside, once seated has poor trunk control with frequent leaning/falling to the left, unable to stand after repeated attempts due to weakness and demonstrate fair return for repositioning self in bed using BUE/LE with bed in head down position.  Patient will benefit from continued skilled physical therapy in hospital and recommended venue below to increase strength, balance, endurance for safe ADLs and gait.     Recommendations for follow up therapy are one component of a multi-disciplinary discharge planning process, led by the attending physician.  Recommendations may be updated based on patient status, additional functional criteria and insurance authorization.  Follow Up Recommendations  Skilled nursing-short term rehab (<3 hours/day) Can patient physically be transported by private vehicle: No   Assistance Recommended at Discharge Frequent or constant Supervision/Assistance   Patient can return home with the following A lot of help with bathing/dressing/bathroom;A lot of help with walking and/or transfers;Help with stairs or ramp for entrance;Assistance with cooking/housework   Equipment Recommendations  None recommended by PT    Recommendations for Other Services       Precautions / Restrictions Precautions Precautions: Fall Restrictions Weight Bearing Restrictions: No     Mobility  Bed Mobility Overal bed mobility: Needs Assistance Bed Mobility: Supine to Sit, Sit to Supine     Supine to sit: Mod assist Sit to supine: Max assist   General bed mobility comments: slow labored movement    Transfers                        Ambulation/Gait                   Stairs             Wheelchair Mobility    Modified Rankin (Stroke Patients Only)       Balance Overall balance assessment: Needs assistance Sitting-balance support: Feet supported, No upper extremity supported Sitting balance-Leahy Scale: Poor Sitting balance - Comments: frequent leaning falling to the left Postural control: Left lateral lean                                  Cognition Arousal/Alertness: Awake/alert Behavior During Therapy: WFL for tasks assessed/performed Overall Cognitive Status: No family/caregiver present to determine baseline cognitive functioning  General Comments: patient appears confused requiring repeated instructions to follow directions with poor carryover        Exercises General Exercises - Lower Extremity Ankle Circles/Pumps: Seated, AROM, Strengthening, Both, 5 reps Long Arc Quad: Seated, AAROM, Strengthening, Both, 5 reps    General Comments        Pertinent Vitals/Pain Pain Assessment Pain Assessment: No/denies pain    Home Living                          Prior Function            PT Goals (current goals can now be found in the care  plan section) Acute Rehab PT Goals Patient Stated Goal: return home with home aides to assist PT Goal Formulation: With patient Time For Goal Achievement: 11/30/22 Potential to Achieve Goals: Good Progress towards PT goals: Progressing toward goals    Frequency    Min 3X/week      PT Plan Current plan remains appropriate    Co-evaluation              AM-PAC PT "6 Clicks" Mobility   Outcome Measure  Help needed turning from your back to your side while in a flat bed without using bedrails?: A Lot Help needed moving from lying on your back to sitting on the side of a flat bed without using bedrails?: A Lot Help needed moving to and from a bed to a chair (including a wheelchair)?: Total Help needed standing up from a chair using your arms (e.g., wheelchair or bedside chair)?: Total Help needed to walk in hospital room?: Total Help needed climbing 3-5 steps with a railing? : Total 6 Click Score: 8    End of Session   Activity Tolerance: Patient tolerated treatment well;Patient limited by fatigue Patient left: in bed;with call bell/phone within reach;with bed alarm set Nurse Communication: Mobility status PT Visit Diagnosis: Unsteadiness on feet (R26.81);Other abnormalities of gait and mobility (R26.89);Muscle weakness (generalized) (M62.81)     Time: [id number] PT Time Calculation (min) (ACUTE ONLY): 20 min  Charges:  $Therapeutic Activity: 8-22 mins                     3:43 PM, 11/23/22 [name], MPT Physical Therapist with [facility] [phone] office [phone] mobile phone

## 2022-11-24 ENCOUNTER — Telehealth (HOSPITAL_COMMUNITY): Payer: Self-pay

## 2022-11-24 ENCOUNTER — Ambulatory Visit: Payer: Medicaid Other | Admitting: Urology

## 2022-11-24 ENCOUNTER — Encounter (HOSPITAL_COMMUNITY): Payer: Medicaid Other

## 2022-11-24 DIAGNOSIS — E876 Hypokalemia: Secondary | ICD-10-CM | POA: Diagnosis not present

## 2022-11-24 LAB — GLUCOSE, CAPILLARY
Glucose-Capillary: 75 mg/dL (ref 70–99)
Glucose-Capillary: 72 mg/dL (ref 70–99)
Glucose-Capillary: 61 mg/dL — ABNORMAL LOW (ref 70–99)
Glucose-Capillary: 71 mg/dL (ref 70–99)
Glucose-Capillary: 119 mg/dL — ABNORMAL HIGH (ref 70–99)

## 2022-11-24 MED ORDER — SODIUM CHLORIDE 0.9 % IV BOLUS: 1000.0000 mL | Freq: Once | INTRAVENOUS | Status: DC

## 2022-11-24 MED ORDER — MIDODRINE HCL 5 MG PO TABS
5.0000 mg | ORAL_TABLET | Freq: Once | ORAL | Status: AC
  Administered 2022-11-24: 5 mg via ORAL
  Filled 2022-11-24: qty 1

## 2022-11-24 MED ORDER — MIDODRINE HCL 5 MG PO TABS
10.0000 mg | ORAL_TABLET | Freq: Three times a day (TID) | ORAL | Status: DC
  Administered 2022-11-24 – 2022-11-29 (×14): 10 mg via ORAL
  Filled 2022-11-24 (×16): qty 2

## 2022-11-24 NOTE — Progress Notes (Signed)
   11/24/22 0849  Vitals  BP (!) 99/43  Pulse Rate 77  MEWS COLOR  MEWS Score Color Green  MEWS Score  MEWS Temp 0  MEWS Systolic 1  MEWS Pulse 0  MEWS RR 0  MEWS LOC 0  MEWS Score 1   Metoprolol held. MD notified. Midodrine was ordered by MD Emokpae and administered to the patient.

## 2022-11-24 NOTE — Telephone Encounter (Signed)
Called patient today at around 16:22 to follow up with his appointment today. Patient did not pick up the call so a voice message was left. Patient did not show up and today is his first no show. Also reminded patient of his appointment tomorrow and the cancellation/no show policy of the facility.

## 2022-11-24 NOTE — Telephone Encounter (Signed)
Called patient today to follow up with his appointment. Patient did not pick up the call so PT left a voice mail. Patient did not show up for his appointment today and today is his first no show. Reminded patient of his appointment for tomorrow and the facility's cancellation/no show policy.

## 2022-11-24 NOTE — Progress Notes (Addendum)
Cirrhotic with no IV access given a dose midodrine for asymptomatic hypotension.

## 2022-11-24 NOTE — Progress Notes (Addendum)
PROGRESS NOTE  [name] [id number] DOB: Mar 02, 1966 DOA: 11/15/2022 PCP: [facility]  Brief History:  57 year old male with a history of diabetes mellitus type 2, pancytopenia, recurrent DVT/PE on Eliquis, RA, Crohn's colitis status post colectomy with ileostomy 2009 presenting with multiple falls.  The patient is a poor historian. Patient was seen on 05 November 2022 by liver transplant at [facility].  He was noted to have nonalcoholic fatty liver disease along with cirrhosis.  Autoimmune workup was started.  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  Has not had follow-up yet.   Patient been taking Demadex 20 mg a day prescribed by his cardiologist due to lower extremity edema.  Patient has not been started on Aldactone yet. This was mentioned during his hepatology consult. Patient states that he is taking Aldactone but there is no recording of this in his chart.   The patient was recently hospitalized from 09/06/2022 to 09/09/2022 for GI bleed.  EGD  09/07/22 with 1cm hh, Erythematous mucosa in the gastric fundus. Treated w/ (APC).GAVE w/ bleeding. Treated w/ (APC). Erythematous duodenopathy.  The patient was discharged home with pantoprazole twice daily.  In the ED, the patient was noted to be tachycardic in the 110s.  He was hypotensive with systolic blood pressure 83.  The patient was given 3.5 L of fluid.  Lactic acid was 3.7.  He was admitted for further evaluation and treatment of SIRS and altered mentation    Assessment/Plan: 1)Frequent falls/failure to thrive -TSH, B12, folate and ammonia within normal limits -Continue supportive care -Physical therapy has recommended skilled nursing facility for rehab at discharge -[facility] assisting with discharge plans.  2)Generalized weakness -Multifactorial including deconditioning, hypokalemia, possible infectious process. -Following recommendations by infectious disease service will treat for  2 weeks using oral antibiotics (ciprofloxacin and Zyvox). -Follow physical therapy evaluation/recommendations. -Currently with recommendation for a skilled nursing facility at discharge once medically stable.  3)Urinary retention -Foley catheter placed in the ED on admission -CT shows tip of the catheter is in the prostatic urethra -Requested nursing to advance/adjust foley -Continue monitoring urine output -Patient denying dysuria and suprapubic tenderness at this moment. -Patient will benefit of outpatient follow-up with urology service; -plan is for patient to be discharged with Foley catheter in place (looking for 10 days or so; if still hospitalized, will touch basis with urology).  4)Episode of hypotension--hold metoprolol -Start midodrine  Acute metabolic encephalopathy---suspect due to underlying bacteremia compounded by liver cirrhosis and possible hepatic encephalopathy -Patient mentation slowly improving. -Ammonia was 66 on 11/17/22--- recheck -CT brain negative -UA negative for pyuria -Positive bacteremia as mentioned below -Continue current antibiotics and supportive care..  Lactic acidosis/enterococcal and Acinetobacter bacteremia -Suspect this is due to decreased clearance from his hepatic cirrhosis, versus associated with active infection. -Following ID recommendations 2D echo performed; no vegetations appreciated.  Holding on TEE at this moment -Based on cultures sensitivity antibiotics will be narrow to Cipro and Zyvox with plans for 2 weeks of therapy.  Hypokalemia -Repleted and is stable currently. -Magnesium within normal limits -Continue to follow electrolytes trend.  Mild hyponatremia -Most likely associated with chronic cirrhosis and dehydration -Improved and pretty much back to baseline after fluid resuscitation provided -Continue to follow electrolytes trend.  Liver cirrhosis secondary to nonalcoholic steatohepatitis (NASH)  -Pt was seen by transplant  hepatology at [facility] on 11-05-2022. He has outpatient labs that showed + ANA and + anti-smooth muscle  antibody.  -This calls into question whether his cirrhosis is caused by autoimmune hepatitis. -Continue outpatient follow-up with GI service -LFTs appears to be stable. -Continue holding Aldactone and Demadex for now.  Crohn's disease of both small and large intestine with complication ([place]) -Pt with ileostomy.  -Pt denies any high output from ileostomy -Continue to maintain adequate hydration.  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  Bipolar -continue depakote, risperdal, sertraline, benztropine -Overall mood is stable currently. -Following commands appropriately.  Mixed Hyperlipidemia -continue statin -Heart healthy diet discussed with patient.  History DVTs/PE -Resume use of Eliquis for secondary prevention.  Pancytopenia -chronic. -Continue patient follow-up with hematology service. -Continue intermittent monitoring of patient's hemoglobin and platelet count. -Hemoglobin has remained stable.   -No overt bleeding appreciated.  Class 2 Obese -BMI 37.04 -Low-calorie diet and portion control discussed with patient.  Capacity evaluation:-  [name] is  a  57 y.o.  who is AAO x 2 with episodes of confusion, disorientation and intermittent delusion/psychosis/hallucinations who is unable to understand (without significant language barrier) his current medical diagnosis, patient also is unable to verbalizes understanding of proposed treatment options including option of no treatment, the consequences/risk versus benefit of each treatment option, alternatives as well as the option of no treatment. Based on my evaluation [name]  appears to LACK the Capacity to make decisions and/or give informed consent about his medical care at this time.  A surrogate decision-maker is required as  [name]  appears to LACK capacity to make his own decisions and/or give informed consent regarding his medical care at this time  Discussed with [name] , RN  and Ms [name] (from [facility])--- who are very familiar with patient --- apparently at baseline prior to this admission patient typically was mostly independent prior to admission he was getting about 16 hours of aide services at home otherwise he was managing with his ADLs - Patient currently has bacteremia and liver cirrhosis with acute metabolic encephalopathy and is unable to make decisions as outlined above  Family Communication:   -  Unable to reach patient's sister who lives out of state on available phone number.---Left voicemail for sister at [phone] -Discussed with [name] , RN  and Ms [name] (from [place])  Consultants: Infectious disease.  Code Status:  FULL   DVT Prophylaxis:  SCDs   Procedures: As Listed in Progress Note Above  Antibiotics: Ciprofloxacin and Zyvox  Subjective: [name] (from [facility]) at bedside, questions answered No fever  Or chills  -Appetite is fair -Awake but forgetful and somewhat disoriented at times Episode of hypotension--   Objective: Vitals:   11/24/22 0343 11/24/22 0500 11/24/22 0849 11/24/22 1300  BP: (!) 85/40  (!) 99/43 (!) 98/39  Pulse: 78  77 78  Resp: 18     Temp: 98.2 F (36.8 C)   (!) 97.5 F (36.4 C)  TempSrc:      SpO2: 96%   90%  Weight:  104.8 kg    Height:        Intake/Output Summary (Last 24 hours) at 11/24/2022 1436 Last data filed at 11/24/2022 1300 Gross per 24 hour  Intake 840 ml  Output 1100 ml  Net -260 ml   Weight change: -0.1 kg  Exam:  Physical Exam  Gen:- Awake Alert, in no acute distress  HEENT:- [place].AT, No sclera icterus Neck-Supple Neck,No JVD,.  Lungs-  CTAB , fair air movement bilaterally  CV- S1, S2 normal, RRR Abd-  +ve B.Sounds, Abd Soft, No tenderness, ostomy with mostly  stool Extremity/Skin:- +ve  edema,   good pedal pulses  Psych-affect is appropriate, oriented x 2, episodes of disorientation and confusion persist Neuro-General weakness no new focal deficits, no tremors GU-Foley in situ   Data Reviewed: I have personally reviewed following labs and imaging studies  Basic Metabolic Panel: Recent Labs  Lab 11/18/22 0408 11/20/22 0517 11/22/22 0508  NA 131* 131* 131*  K 4.5 4.8 4.6  CL 104 101 104  CO2 24 22 21*  GLUCOSE 87 68* 104*  BUN 16 22* 26*  CREATININE 0.93 1.11 1.10  CALCIUM 7.9* 7.8* 7.9*    No results for input(s): "AMMONIA" in the last 168 hours.  CBC: Recent Labs  Lab 11/18/22 0408 11/20/22 0517 11/22/22 1114  WBC 4.4 5.7 5.4  HGB 7.4* 7.9* 9.0*  HCT 22.7* 26.3* 31.3*  MCV 103.7* 111.4* 114.7*  PLT 59* 79* 75*   Cardiac Enzymes: No results for input(s): "CKTOTAL", "CKMB", "CKMBINDEX", "TROPONINI" in the last 168 hours.  CBG: Recent Labs  Lab 11/23/22 1116 11/23/22 1610 11/23/22 2146 11/24/22 0728 11/24/22 1120  GLUCAP 118* 101* 111* 75 72   Urine analysis:    Component Value Date/Time   COLORURINE YELLOW 11/15/2022 1327   APPEARANCEUR CLEAR 11/15/2022 1327   APPEARANCEUR Cloudy (A) 08/25/2022 1417   LABSPEC 1.012 11/15/2022 1327   PHURINE 6.0 11/15/2022 1327   GLUCOSEU NEGATIVE 11/15/2022 1327   HGBUR NEGATIVE 11/15/2022 1327   BILIRUBINUR NEGATIVE 11/15/2022 1327   BILIRUBINUR Negative 08/25/2022 1417   KETONESUR NEGATIVE 11/15/2022 1327   PROTEINUR NEGATIVE 11/15/2022 1327   UROBILINOGEN 0.2 10/17/2014 1940   NITRITE NEGATIVE 11/15/2022 1327   LEUKOCYTESUR NEGATIVE 11/15/2022 1327   Sepsis Labs:  Recent Results (from the past 240 hour(s))  Blood Culture (routine x 2)     Status: Abnormal   Collection Time: 11/15/22 12:23 PM   Specimen: BLOOD RIGHT HAND  Result Value Ref Range Status   Specimen Description   Final    BLOOD RIGHT HAND Performed at [facility], [address]  [place], [place] [address]    Special Requests   Final    BOTTLES DRAWN AEROBIC AND ANAEROBIC Blood Culture adequate volume Performed at [facility], [address] [place], [place] [address]    Culture  Setup Time   Final    GRAM POSITIVE COCCI BOTTLES DRAWN AEROBIC AND ANAEROBIC Gram Stain Report Called to,Read Back By and Verified With: [email or web address],[email or web address]$  BY MATTHEWS B 2.13.2024 CRITICAL RESULT CALLED TO, READ BACK BY AND VERIFIED WITH: PHARMD FRANK WILSON  ON 11/16/22 @ 1444 BY DRT Performed at [facility], [place] [address], [place], [place] [address]    Culture (A)  Final    ACINETOBACTER CALCOACETICUS/BAUMANNII COMPLEX ENTEROCOCCUS FAECALIS    Report Status 11/18/2022 FINAL  Final   Organism ID, Bacteria ACINETOBACTER CALCOACETICUS/BAUMANNII COMPLEX  Final   Organism ID, Bacteria ENTEROCOCCUS FAECALIS  Final      Susceptibility   Acinetobacter calcoaceticus/baumannii complex - MIC*    CEFTAZIDIME 8 SENSITIVE Sensitive     CIPROFLOXACIN 1 SENSITIVE Sensitive     GENTAMICIN 8 INTERMEDIATE Intermediate     IMIPENEM <=0.25 SENSITIVE Sensitive     PIP/TAZO <=4 SENSITIVE Sensitive     TRIMETH/SULFA <=20 SENSITIVE Sensitive     AMPICILLIN/SULBACTAM <=2 SENSITIVE Sensitive     * ACINETOBACTER CALCOACETICUS/BAUMANNII COMPLEX  Enterococcus faecalis - MIC*    AMPICILLIN <=2 SENSITIVE Sensitive     VANCOMYCIN 1 SENSITIVE Sensitive     GENTAMICIN SYNERGY RESISTANT Resistant     * ENTEROCOCCUS FAECALIS  Blood Culture ID Panel (Reflexed)     Status: Abnormal   Collection Time: 11/15/22 12:23 PM  Result Value Ref Range Status   Enterococcus faecalis NOT DETECTED NOT DETECTED Final   Enterococcus Faecium NOT DETECTED NOT DETECTED Final   Listeria monocytogenes NOT DETECTED NOT DETECTED Final   Staphylococcus species NOT DETECTED NOT DETECTED Final   Staphylococcus aureus (BCID) NOT DETECTED NOT DETECTED Final   Staphylococcus epidermidis NOT DETECTED NOT DETECTED Final   Staphylococcus  lugdunensis NOT DETECTED NOT DETECTED Final   Streptococcus species NOT DETECTED NOT DETECTED Final   Streptococcus agalactiae NOT DETECTED NOT DETECTED Final   Streptococcus pneumoniae NOT DETECTED NOT DETECTED Final   Streptococcus pyogenes NOT DETECTED NOT DETECTED Final   A.calcoaceticus-baumannii DETECTED (A) NOT DETECTED Final    Comment: CRITICAL RESULT CALLED TO, READ BACK BY AND VERIFIED WITH: PHARMD FRANK WILSON  ON 11/16/22 @ 1444 BY DRT    Bacteroides fragilis NOT DETECTED NOT DETECTED Final   Enterobacterales NOT DETECTED NOT DETECTED Final   Enterobacter cloacae complex NOT DETECTED NOT DETECTED Final   Escherichia coli NOT DETECTED NOT DETECTED Final   Klebsiella aerogenes NOT DETECTED NOT DETECTED Final   Klebsiella oxytoca NOT DETECTED NOT DETECTED Final   Klebsiella pneumoniae NOT DETECTED NOT DETECTED Final   Proteus species NOT DETECTED NOT DETECTED Final   Salmonella species NOT DETECTED NOT DETECTED Final   Serratia marcescens NOT DETECTED NOT DETECTED Final   Haemophilus influenzae NOT DETECTED NOT DETECTED Final   Neisseria meningitidis NOT DETECTED NOT DETECTED Final   Pseudomonas aeruginosa NOT DETECTED NOT DETECTED Final   Stenotrophomonas maltophilia NOT DETECTED NOT DETECTED Final   Candida albicans NOT DETECTED NOT DETECTED Final   Candida auris NOT DETECTED NOT DETECTED Final   Candida glabrata NOT DETECTED NOT DETECTED Final   Candida krusei NOT DETECTED NOT DETECTED Final   Candida parapsilosis NOT DETECTED NOT DETECTED Final   Candida tropicalis NOT DETECTED NOT DETECTED Final   Cryptococcus neoformans/gattii NOT DETECTED NOT DETECTED Final   CTX-M ESBL NOT DETECTED NOT DETECTED Final   Carbapenem resistance IMP NOT DETECTED NOT DETECTED Final   Carbapenem resistance KPC NOT DETECTED NOT DETECTED Final   Carbapenem resistance NDM NOT DETECTED NOT DETECTED Final   Carbapenem resistance VIM NOT DETECTED NOT DETECTED Final    Comment: Performed at  [facility], [address] [place], [place] [address]  Blood Culture (routine x 2)     Status: None   Collection Time: 11/15/22 12:28 PM   Specimen: BLOOD LEFT HAND  Result Value Ref Range Status   Specimen Description BLOOD LEFT HAND  Final   Special Requests   Final    BOTTLES DRAWN AEROBIC ONLY Blood Culture results may not be optimal due to an inadequate volume of blood received in culture bottles   Culture   Final    NO GROWTH 5 DAYS Performed at [facility], [address] [place], [place] [address]    Report Status 11/20/2022 FINAL  Final  Resp panel by RT-PCR (RSV, Flu A&B, Covid) Anterior Nasal Swab     Status: None   Collection Time: 11/15/22  1:28 PM   Specimen: Anterior Nasal Swab  Result Value Ref Range Status   SARS Coronavirus 2 by RT PCR  NEGATIVE NEGATIVE Final    Comment: (NOTE) SARS-CoV-2 target nucleic acids are NOT DETECTED.  The SARS-CoV-2 RNA is generally detectable in upper respiratory specimens during the acute phase of infection. The lowest concentration of SARS-CoV-2 viral copies this assay can detect is 138 copies/mL. A negative result does not preclude SARS-Cov-2 infection and should not be used as the sole basis for treatment or other patient management decisions. A negative result may occur with  improper specimen collection/handling, submission of specimen other than nasopharyngeal swab, presence of viral mutation(s) within the areas targeted by this assay, and inadequate number of viral copies(<138 copies/mL). A negative result must be combined with clinical observations, patient history, and epidemiological information. The expected result is Negative.  Fact Sheet for Patients:  EntrepreneurPulse.com.au  Fact Sheet for Healthcare Providers:  IncredibleEmployment.be  This test is no t yet approved or cleared by the [place] FDA and  has been authorized for detection and/or diagnosis of  SARS-CoV-2 by FDA under an Emergency Use Authorization (EUA). This EUA will remain  in effect (meaning this test can be used) for the duration of the COVID-19 declaration under Section 564(b)(1) of the Act, 21 U.S.C.section 360bbb-3(b)(1), unless the authorization is terminated  or revoked sooner.       Influenza A by PCR NEGATIVE NEGATIVE Final   Influenza B by PCR NEGATIVE NEGATIVE Final    Comment: (NOTE) The Xpert Xpress SARS-CoV-2/FLU/RSV plus assay is intended as an aid in the diagnosis of influenza from Nasopharyngeal swab specimens and should not be used as a sole basis for treatment. Nasal washings and aspirates are unacceptable for Xpert Xpress SARS-CoV-2/FLU/RSV testing.  Fact Sheet for Patients: EntrepreneurPulse.com.au  Fact Sheet for Healthcare Providers: IncredibleEmployment.be  This test is not yet approved or cleared by the [place] FDA and has been authorized for detection and/or diagnosis of SARS-CoV-2 by FDA under an Emergency Use Authorization (EUA). This EUA will remain in effect (meaning this test can be used) for the duration of the COVID-19 declaration under Section 564(b)(1) of the Act, 21 U.S.C. section 360bbb-3(b)(1), unless the authorization is terminated or revoked.     Resp Syncytial Virus by PCR NEGATIVE NEGATIVE Final    Comment: (NOTE) Fact Sheet for Patients: EntrepreneurPulse.com.au  Fact Sheet for Healthcare Providers: IncredibleEmployment.be  This test is not yet approved or cleared by the [place] FDA and has been authorized for detection and/or diagnosis of SARS-CoV-2 by FDA under an Emergency Use Authorization (EUA). This EUA will remain in effect (meaning this test can be used) for the duration of the COVID-19 declaration under Section 564(b)(1) of the Act, 21 U.S.C. section 360bbb-3(b)(1), unless the authorization is terminated  or revoked.  Performed at [facility], [address], [place], [place] [address]   Culture, blood (Routine X 2) w Reflex to ID Panel     Status: None   Collection Time: 11/17/22  4:52 PM   Specimen: BLOOD RIGHT HAND  Result Value Ref Range Status   Specimen Description BLOOD RIGHT HAND  Final   Special Requests   Final    BOTTLES DRAWN AEROBIC AND ANAEROBIC Blood Culture results may not be optimal due to an inadequate volume of blood received in culture bottles   Culture   Final    NO GROWTH 5 DAYS Performed at [facility], [address], [place], [place] [address]    Report Status 11/22/2022 FINAL  Final  Culture, blood (Routine X 2) w Reflex to ID Panel  Status: None   Collection Time: 11/17/22  6:30 PM   Specimen: BLOOD LEFT HAND  Result Value Ref Range Status   Specimen Description BLOOD LEFT HAND  Final   Special Requests   Final    BOTTLES DRAWN AEROBIC ONLY Blood Culture adequate volume   Culture   Final    NO GROWTH 5 DAYS Performed at [facility], [address], [place], [place] [address]    Report Status 11/22/2022 FINAL  Final     Scheduled Meds:  apixaban  5 mg Oral BID   atorvastatin  80 mg Oral Daily   benztropine  0.5 mg Oral Daily   Chlorhexidine Gluconate Cloth  6 each Topical Daily   ciprofloxacin  500 mg Oral BID   divalproex  1,000 mg Oral QHS   divalproex  250 mg Oral Daily   hydroxychloroquine  200 mg Oral BID   insulin aspart  0-5 Units Subcutaneous QHS   insulin aspart  0-9 Units Subcutaneous TID WC   linezolid  600 mg Oral Q12H   midodrine  10 mg Oral TID WC   pantoprazole  40 mg Oral BID   risperiDONE  1 mg Oral QHS   risperiDONE  6 mg Oral QHS   sertraline  50 mg Oral Daily   traZODone  100 mg Oral QHS    sodium chloride     Procedures/Studies: ECHOCARDIOGRAM COMPLETE  Result Date: 11/18/2022    ECHOCARDIOGRAM REPORT   Patient Name:   MOISES KOLTON Date of Exam: 11/18/2022 Medical Rec #:  [id number]      Height:       66.0 in  Accession #:    [id number]     Weight:       228.4 lb Date of Birth:  [birth year]     BSA:          2.116 m Patient Age:    54 years       BP:           105/56 mmHg Patient Gender: M              HR:           91 bpm. Exam Location:  [facility] Procedure: 2D Echo, Cardiac Doppler and Color Doppler Indications:    Bacteremia  History:        Patient has prior history of Echocardiogram examinations, most                 recent 11/13/2020. Cirrhosis; Risk Factors:Diabetes, Current                 Smoker and Dyslipidemia.  Sonographer:    Johny Chess RDCS Referring Phys: [phone] CARLOS MADERA  Sonographer Comments: Technically difficult study due to poor echo windows. Image acquisition challenging due to respiratory motion. IMPRESSIONS  1. Left ventricular ejection fraction, by estimation, is 60 to 65%. The left ventricle has normal function. Left ventricular endocardial border not optimally defined to evaluate regional wall motion. Left ventricular diastolic parameters were normal.  2. Right ventricular systolic function is normal. The right ventricular size is normal. Tricuspid regurgitation signal is inadequate for assessing PA pressure.  3. Left atrial size was mildly dilated.  4. The mitral valve is grossly normal. Trivial mitral valve regurgitation.  5. The aortic valve is tricuspid. Aortic valve regurgitation is not visualized.  6. The inferior vena cava is normal in size with greater than 50% respiratory variability, suggesting right atrial pressure of 3  mmHg.  7. No obvious valvular vegetations noted with significantly limited views. Comparison(s): Prior images reviewed side by side. LVEF remains normal range at 60-65%. FINDINGS  Left Ventricle: Left ventricular ejection fraction, by estimation, is 60 to 65%. The left ventricle has normal function. Left ventricular endocardial border not optimally defined to evaluate regional wall motion. The left ventricular internal cavity size was normal in size. There is  no left ventricular hypertrophy. Left ventricular diastolic parameters were normal. Right Ventricle: The right ventricular size is normal. No increase in right ventricular wall thickness. Right ventricular systolic function is normal. Tricuspid regurgitation signal is inadequate for assessing PA pressure. Left Atrium: Left atrial size was mildly dilated. Right Atrium: Right atrial size was normal in size. Pericardium: There is no evidence of pericardial effusion. Presence of epicardial fat layer. Mitral Valve: The mitral valve is grossly normal. Trivial mitral valve regurgitation. Tricuspid Valve: The tricuspid valve is grossly normal. Tricuspid valve regurgitation is trivial. Aortic Valve: The aortic valve is tricuspid. There is mild aortic valve annular calcification. Aortic valve regurgitation is not visualized. Pulmonic Valve: The pulmonic valve was grossly normal. Pulmonic valve regurgitation is trivial. Aorta: The aortic root is normal in size and structure. Venous: The inferior vena cava is normal in size with greater than 50% respiratory variability, suggesting right atrial pressure of 3 mmHg. IAS/Shunts: No atrial level shunt detected by color flow Doppler.  LEFT VENTRICLE PLAX 2D LVIDd:         4.40 cm   Diastology LVIDs:         2.80 cm   LV e' medial:    9.25 cm/s LV PW:         0.90 cm   LV E/e' medial:  10.5 LV IVS:        0.70 cm   LV e' lateral:   15.30 cm/s LVOT diam:     1.80 cm   LV E/e' lateral: 6.4 LV SV:         54 LV SV Index:   26 LVOT Area:     2.54 cm  RIGHT VENTRICLE             IVC RV S prime:     13.60 cm/s  IVC diam: 1.40 cm TAPSE (M-mode): 4.0 cm LEFT ATRIUM           Index        RIGHT ATRIUM           Index LA diam:      3.00 cm 1.42 cm/m   RA Area:     10.70 cm LA Vol (A4C): 77.8 ml 36.77 ml/m  RA Volume:   23.60 ml  11.15 ml/m  AORTIC VALVE LVOT Vmax:   131.00 cm/s LVOT Vmean:  84.800 cm/s LVOT VTI:    0.214 m  AORTA Ao Root diam: 2.60 cm MITRAL VALVE MV Area (PHT): 3.93 cm     SHUNTS MV Decel Time: 193 msec    Systemic VTI:  0.21 m MV E velocity: 97.40 cm/s  Systemic Diam: 1.80 cm MV A velocity: 99.00 cm/s MV E/A ratio:  0.98 Rozann Lesches MD Electronically signed by Rozann Lesches MD Signature Date/Time: 11/18/2022/2:31:20 PM    Final    CT CHEST ABDOMEN PELVIS W CONTRAST  Result Date: 11/15/2022 CLINICAL DATA:  Sepsis, polytrauma, fall EXAM: CT CHEST, ABDOMEN, AND PELVIS WITH CONTRAST TECHNIQUE: Multidetector CT imaging of the chest, abdomen and pelvis was performed following the standard protocol during bolus administration of  intravenous contrast. RADIATION DOSE REDUCTION: This exam was performed according to the departmental dose-optimization program which includes automated exposure control, adjustment of the mA and/or kV according to patient size and/or use of iterative reconstruction technique. CONTRAST:  163m OMNIPAQUE IOHEXOL 300 MG/ML  SOLN COMPARISON:  CT abdomen pelvis, 09/06/2022 CT chest, 04/15/2022 FINDINGS: CT CHEST FINDINGS Cardiovascular: Right chest port catheter. Aortic atherosclerosis. Normal heart size. Left coronary artery calcifications. No pericardial effusion. Mediastinum/Nodes: No enlarged mediastinal, hilar, or axillary lymph nodes. Small incidental diverticulum of the posterior right trachea at the level of the thoracic inlet (series 2, image 8). Thyroid gland and esophagus demonstrate no significant findings. Lungs/Pleura: Moderate right pleural effusion and associated atelectasis or consolidation. Musculoskeletal: No chest wall abnormality. No acute osseous findings. Status post bilateral shoulder arthroplasty. CT ABDOMEN PELVIS FINDINGS Hepatobiliary: Coarse contour of the liver. No gallstones, gallbladder wall thickening, or biliary dilatation. Pancreas: Unremarkable. No pancreatic ductal dilatation or surrounding inflammatory changes. Spleen: Normal in size without significant abnormality. Adrenals/Urinary Tract: Adrenal glands are unremarkable.  Kidneys are normal, without renal calculi, solid lesion, or hydronephrosis. Malpositioned Foley catheter, tip and balloon within the prostatic or bulbous urethra (series 5, image 111) Stomach/Bowel: Stomach is within normal limits. 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 Vascular/Lymphatic: Aortic atherosclerosis. 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 No enlarged abdominal or pelvic lymph nodes. Reproductive: No mass or other abnormality. Other: No abdominal wall hernia or abnormality. Small volume perihepatic ascites. Musculoskeletal: No acute osseous findings. 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 IMPRESSION: 1. No CT evidence of acute traumatic injury to the chest, abdomen, or pelvis. 2. Moderate right pleural effusion and associated atelectasis or consolidation. 3. Malpositioned Foley catheter, tip and balloon within the prostatic or bulbous urethra. Recommend repositioning. 4. 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 5. Small volume perihepatic ascites. 6. Coarse contour of the liver and gastric varices, suggestive of cirrhosis. 7. Coronary artery disease. Aortic Atherosclerosis (ICD10-I70.0). Electronically Signed   By: ADelanna AhmadiM.D.   On: 11/15/2022 14:32   CT Head Wo Contrast  Result Date: 11/15/2022 CLINICAL DATA:  Three falls in the past 24 hours. EXAM: CT HEAD WITHOUT CONTRAST CT CERVICAL SPINE WITHOUT CONTRAST TECHNIQUE: Multidetector CT imaging of the head and cervical spine was performed following the standard protocol without intravenous contrast. Multiplanar CT image reconstructions of the cervical spine were also generated. RADIATION DOSE REDUCTION: This exam was performed according to the departmental dose-optimization program which includes automated exposure control, adjustment of the mA and/or kV according to patient size and/or use of iterative reconstruction technique. COMPARISON:  CT  head 05/16/2021 FINDINGS: CT HEAD FINDINGS Brain: There is no acute intracranial hemorrhage, extra-axial fluid collection, or acute infarct. Parenchymal volume is normal. The ventricles are normal in size. Gray-white differentiation is preserved. The pituitary and suprasellar region are normal. There is no mass lesion. There is no mass effect or midline shift. Vascular: No hyperdense vessel or unexpected calcification. Skull: Normal. Negative for fracture or focal lesion. Sinuses/Orbits: There is mild mucosal thickening in the paranasal sinuses. The globes and orbits are unremarkable. Other: None. CT CERVICAL SPINE FINDINGS Alignment: Normal. There is no jumped or perched facet or other evidence of traumatic malalignment. Skull base and vertebrae: Skull base alignment is maintained. Vertebral body heights are preserved. There is no evidence of acute fracture. There is no suspicious osseous lesion. Soft tissues and spinal canal: No prevertebral fluid or swelling. No visible  canal hematoma. Disc levels: Facet arthropathy is most advanced on the right at C3-C4. There is overall mild disc space narrowing at C4-C5 and C5-C6. There is no convincing high-grade spinal canal stenosis. Upper chest: Assessed on the separately dictated CT chest. Other: None. IMPRESSION: 1. No acute intracranial pathology. 2. No acute fracture or traumatic malalignment of the cervical spine. Electronically Signed   By: Valetta Mole M.D.   On: 11/15/2022 14:24   CT Cervical Spine Wo Contrast  Result Date: 11/15/2022 CLINICAL DATA:  Three falls in the past 24 hours. EXAM: CT HEAD WITHOUT CONTRAST CT CERVICAL SPINE WITHOUT CONTRAST TECHNIQUE: Multidetector CT imaging of the head and cervical spine was performed following the standard protocol without intravenous contrast. Multiplanar CT image reconstructions of the cervical spine were also generated. RADIATION DOSE REDUCTION: This exam was performed according to the departmental  dose-optimization program which includes automated exposure control, adjustment of the mA and/or kV according to patient size and/or use of iterative reconstruction technique. COMPARISON:  CT head 05/16/2021 FINDINGS: CT HEAD FINDINGS Brain: There is no acute intracranial hemorrhage, extra-axial fluid collection, or acute infarct. Parenchymal volume is normal. The ventricles are normal in size. Gray-white differentiation is preserved. The pituitary and suprasellar region are normal. There is no mass lesion. There is no mass effect or midline shift. Vascular: No hyperdense vessel or unexpected calcification. Skull: Normal. Negative for fracture or focal lesion. Sinuses/Orbits: There is mild mucosal thickening in the paranasal sinuses. The globes and orbits are unremarkable. Other: None. CT CERVICAL SPINE FINDINGS Alignment: Normal. There is no jumped or perched facet or other evidence of traumatic malalignment. Skull base and vertebrae: Skull base alignment is maintained. Vertebral body heights are preserved. There is no evidence of acute fracture. There is no suspicious osseous lesion. Soft tissues and spinal canal: No prevertebral fluid or swelling. No visible canal hematoma. Disc levels: Facet arthropathy is most advanced on the right at C3-C4. There is overall mild disc space narrowing at C4-C5 and C5-C6. There is no convincing high-grade spinal canal stenosis. Upper chest: Assessed on the separately dictated CT chest. Other: None. IMPRESSION: 1. No acute intracranial pathology. 2. No acute fracture or traumatic malalignment of the cervical spine. Electronically Signed   By: Valetta Mole M.D.   On: 11/15/2022 14:24   DG Chest Port 1 View  Result Date: 11/15/2022 CLINICAL DATA:  Sepsis, possible UTI EXAM: PORTABLE CHEST 1 VIEW COMPARISON:  CT chest dated 03/16/2022 FINDINGS: Lungs are clear.  No pleural effusion or pneumothorax. The heart is normal in size. Right chest power port terminates at the cavoatrial  junction. Bilateral shoulder arthroplasty. IMPRESSION: No evidence of acute cardiopulmonary disease. Electronically Signed   By: Julian Hy M.D.   On: 11/15/2022 13:35    Adams Hinch Denton Brick, MD  [facility]  If 7PM-7AM, please contact night-coverage [email or web address] Password [facility] 11/24/2022, 2:36 PM   LOS: 8 days

## 2022-11-24 NOTE — Progress Notes (Addendum)
Patient's cbg was 61. Standing orders placed for hypoglycemia. Patient was given a juice to drink. Will recheck in 15 minutes. MD Emokpae notified. Cbg was 71 after drinking the juice. Patient also ate an icecream, potatoes, and carrots from dinner.

## 2022-11-24 NOTE — Progress Notes (Signed)
   11/24/22 1300  Vitals  Temp (!) 97.5 F (36.4 C)  BP (!) 98/39  MAP (mmHg) (!) 56  BP Location Right Arm  BP Method Automatic  Pulse Rate 78  Pulse Rate Source Monitor  MEWS COLOR  MEWS Score Color Green  Oxygen Therapy  SpO2 90 %  MEWS Score  MEWS Temp 0  MEWS Systolic 1  MEWS Pulse 0  MEWS RR 0  MEWS LOC 0  MEWS Score 1   MD Emokpae notified.

## 2022-11-24 NOTE — Progress Notes (Signed)
  Capacity evaluation:-  [name] is  a  57 y.o.  who is AAO x 2 with episodes of confusion, disorientation and intermittent delusion/psychosis/hallucinations who is unable to understand (without significant language barrier) his current medical diagnosis, patient also is unable to verbalizes understanding of proposed treatment options including option of no treatment, the consequences/risk versus benefit of each treatment option, alternatives as well as the option of no treatment. Based on my evaluation [name]  appears to LACK the Capacity to make decisions and/or give informed consent about his medical care at this time.  A surrogate decision-maker is required as [name]  appears to LACK capacity to make his own decisions and/or give informed consent regarding his medical care at this time  Discussed with [name] , RN  and Ms [name] (from [facility])--- who are very familiar with patient --- apparently at baseline prior to this admission patient typically was mostly independent prior to admission he was getting about 16 hours of aide services at home otherwise he was managing with his ADLs - Patient currently has bacteremia and liver cirrhosis with acute metabolic encephalopathy and is unable to make decisions as outlined above  Roxan Hockey, MD

## 2022-11-24 NOTE — TOC Progression Note (Addendum)
Transition of Care [facility]) - Progression Note    Patient Details  Name: [name] MRN: [id number] Date of Birth: November 11, 1965  Transition of Care [facility]) CM/SW Contact  Shade Flood, LCSW Phone Number: 11/24/2022, 2:34 PM  Clinical Narrative:     TOC following. This LCSW met with pt's Armen Pickup RN and SW at pt bedside today. Pt alert to self only. Pt not able to make decisions for care at this time. [facility] team reviewed their hope that pt could improve and return home at dc. After [facility] MD met with the [facility] team members and explained that it is his opinion that pt needs NH placement and guardianship.  Discussed above with MD. Contacted pt's payee, Grace Blight, with [place] [employer] to request assistance with filing for guardianship. Awaiting return call from [place].   At this time, pt still does not have any bed offers.   Updated TOC leadership on above.  1537: Received return call from [place], who states she will come see pt tomorrow, obtain records from [facility], and bring to clerk of court for interim guardianship request which will result in a court hearing and determination. Timeframe for court process unknown.  Expected Discharge Plan: Long Term Acute Care (LTAC) Barriers to Discharge: Continued Medical Work up  Expected Discharge Plan and Services                                               Social Determinants of Health (SDOH) Interventions SDOH Screenings   Food Insecurity: No Food Insecurity (11/23/2022)  Recent Concern: Food Insecurity - Food Insecurity Present (09/07/2022)  Housing: Low Risk  (11/23/2022)  Transportation Needs: No Transportation Needs (11/23/2022)  Utilities: Not At Risk (11/23/2022)  Tobacco Use: High Risk (11/15/2022)    Readmission Risk Interventions     No data to display

## 2022-11-25 ENCOUNTER — Encounter (HOSPITAL_COMMUNITY): Payer: Medicaid Other | Admitting: Physical Therapy

## 2022-11-25 ENCOUNTER — Encounter (HOSPITAL_COMMUNITY): Payer: Self-pay | Admitting: Physical Therapy

## 2022-11-25 DIAGNOSIS — E876 Hypokalemia: Secondary | ICD-10-CM | POA: Diagnosis not present

## 2022-11-25 LAB — CBC
HCT: 23.1 % — ABNORMAL LOW (ref 39.0–52.0)
Hemoglobin: 7.3 g/dL — ABNORMAL LOW (ref 13.0–17.0)
MCH: 32.6 pg (ref 26.0–34.0)
MCHC: 31.6 g/dL (ref 30.0–36.0)
MCV: 103.1 fL — ABNORMAL HIGH (ref 80.0–100.0)
Platelets: 45 10*3/uL — ABNORMAL LOW (ref 150–400)
RBC: 2.24 MIL/uL — ABNORMAL LOW (ref 4.22–5.81)
RDW: 17.7 % — ABNORMAL HIGH (ref 11.5–15.5)
WBC: 4.5 10*3/uL (ref 4.0–10.5)
nRBC: 0 % (ref 0.0–0.2)

## 2022-11-25 LAB — COMPREHENSIVE METABOLIC PANEL
ALT: 48 U/L — ABNORMAL HIGH (ref 0–44)
AST: 133 U/L — ABNORMAL HIGH (ref 15–41)
Albumin: <1.5 g/dL — ABNORMAL LOW (ref 3.5–5.0)
Alkaline Phosphatase: 108 U/L (ref 38–126)
Anion gap: 4 — ABNORMAL LOW (ref 5–15)
BUN: 43 mg/dL — ABNORMAL HIGH (ref 6–20)
CO2: 23 mmol/L (ref 22–32)
Calcium: 7.9 mg/dL — ABNORMAL LOW (ref 8.9–10.3)
Chloride: 102 mmol/L (ref 98–111)
Creatinine, Ser: 1.35 mg/dL — ABNORMAL HIGH (ref 0.61–1.24)
GFR, Estimated: >60 mL/min (ref >60)
Glucose, Bld: 72 mg/dL (ref 70–99)
Potassium: 5.5 mmol/L — ABNORMAL HIGH (ref 3.5–5.1)
Sodium: 129 mmol/L — ABNORMAL LOW (ref 135–145)
Total Bilirubin: 1.1 mg/dL (ref 0.3–1.2)
Total Protein: 4.4 g/dL — ABNORMAL LOW (ref 6.5–8.1)

## 2022-11-25 LAB — GLUCOSE, CAPILLARY
Glucose-Capillary: 77 mg/dL (ref 70–99)
Glucose-Capillary: 118 mg/dL — ABNORMAL HIGH (ref 70–99)
Glucose-Capillary: 82 mg/dL (ref 70–99)
Glucose-Capillary: 72 mg/dL (ref 70–99)

## 2022-11-25 LAB — AMMONIA: Ammonia: 42 umol/L — ABNORMAL HIGH (ref 9–35)

## 2022-11-25 MED ORDER — SODIUM ZIRCONIUM CYCLOSILICATE 10 G PO PACK
10.0000 g | PACK | Freq: Two times a day (BID) | ORAL | Status: DC
  Administered 2022-11-25 – 2022-11-26 (×2): 10 g via ORAL
  Filled 2022-11-25 (×3): qty 1

## 2022-11-25 NOTE — Progress Notes (Signed)
PROGRESS NOTE  [name] [id number] DOB: May 30, 1966 DOA: 11/15/2022 PCP: [facility]  Brief History:  57 year old male with a history of diabetes mellitus type 2, pancytopenia, recurrent DVT/PE on Eliquis, RA, Crohn's colitis status post colectomy with ileostomy 2009 presenting with multiple falls.  The patient is a poor historian. Patient was seen on 05 November 2022 by liver transplant at [facility].  He was noted to have nonalcoholic fatty liver disease along with cirrhosis.  Autoimmune workup was started.  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  Has not had follow-up yet.   Patient been taking Demadex 20 mg a day prescribed by his cardiologist due to lower extremity edema.  Patient has not been started on Aldactone yet. This was mentioned during his hepatology consult. Patient states that he is taking Aldactone but there is no recording of this in his chart.   The patient was recently hospitalized from 09/06/2022 to 09/09/2022 for GI bleed.  EGD  09/07/22 with 1cm hh, Erythematous mucosa in the gastric fundus. Treated w/ (APC).GAVE w/ bleeding. Treated w/ (APC). Erythematous duodenopathy.  The patient was discharged home with pantoprazole twice daily.  In the ED, the patient was noted to be tachycardic in the 110s.  He was hypotensive with systolic blood pressure 83.  The patient was given 3.5 L of fluid.  Lactic acid was 3.7.  He was admitted for further evaluation and treatment of SIRS and altered mentation    Assessment/Plan: 1)Frequent falls/failure to thrive/generalized weakness and deconditioning -TSH, B12, folate and ammonia within normal limits -Physical therapy has recommended skilled nursing facility for rehab at discharge --Multifactorial including deconditioning, hypokalemia, possible infectious process. -Following recommendations by infectious disease service will treat for 2 weeks using oral antibiotics (ciprofloxacin and  Zyvox). -Follow physical therapy evaluation/recommendations. -Currently with recommendation for a skilled nursing facility at discharge once medically stable.  2)Liver cirrhosis secondary to nonalcoholic steatohepatitis (NASH)  -Pt was seen by transplant hepatology at [facility] on 11-05-2022. He has outpatient labs that showed + ANA and + anti-smooth muscle antibody.  -This calls into question whether his cirrhosis is caused by autoimmune hepatitis. -Continue outpatient follow-up with GI service -Continue holding Aldactone and Demadex for now. 11/25/22 -Ammonia is down to 42 from 66 -LFTs trending up  3)Urinary retention -Foley catheter placed in the ED on admission -CT shows tip of the catheter is in the prostatic urethra---urology contacted,, Foley was repositioned --Patient denying dysuria and suprapubic tenderness at this moment. -Discussed with Dr. Alyson Ingles from urology service recommends leaving Foley catheter in until patient can follow-up with him in the office  4)Episode of hypotension--hold metoprolol 11/25/22 -BP improving with discontinuation of metoprolol and initiation of midodrine -Continue midodrine  Acute metabolic encephalopathy---suspect due to underlying bacteremia compounded by liver cirrhosis  -Patient mentation slowly improving. -CT brain negative -UA negative for pyuria -Positive bacteremia as mentioned below -Continue current antibiotics and supportive care.. -Ammonia is down to 42 from 66  Lactic acidosis/enterococcal and Acinetobacter bacteremia -Suspect this is due to decreased clearance from his hepatic cirrhosis, versus associated with active infection. -Following ID recommendations 2D echo performed; no vegetations appreciated.  Holding on TEE at this moment -Based on cultures sensitivity antibiotics will be narrow to Cipro and Zyvox with plans for 2 weeks of therapy.  Hypokalemia -Repleted and is stable currently. -Magnesium within normal  limits -Continue to follow electrolytes trend.  Mild hyponatremia -Most likely associated with  chronic cirrhosis and dehydration -Improved and pretty much back to baseline after fluid resuscitation provided -Continue to follow electrolytes trend.  Crohn's disease of both small and large intestine with complication ([place]) -Pt with ileostomy.  -Pt denies any high output from ileostomy -Continue to maintain adequate hydration.  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  Bipolar -continue depakote, risperdal, sertraline, benztropine -Overall mood is stable currently. -Following commands appropriately.  Mixed Hyperlipidemia -continue statin -Heart healthy diet discussed with patient.  History DVTs/PE - 11/25/22 Hold Eliquis as Hgb and platelets are drifting down  Pancytopenia -chronic. -Continue patient follow-up with hematology service. ---No overt bleeding appreciated. --11/25/22 Hold Eliquis as Hgb and platelets are drifting down  Class 2 Obese -BMI 37.04 -Low-calorie diet and portion control discussed with patient.  Family Communication:   -  Unable to reach patient's sister who lives out of state on available phone number.---Left voicemail for sister at [phone] -Discussed with [name] , RN  and Ms [name] (from [facility]) previously  Consultants: Infectious disease/urology  Code Status:  FULL   DVT Prophylaxis:  SCDs--- Eliquis on hold due to drop in Hgb and platelets   Procedures: As Listed in Progress Note Above  Antibiotics: Ciprofloxacin and Zyvox  Subjective: - No bleeding concerns -Awake and cooperative No fever  Or chills   Objective: Vitals:   11/24/22 1300 11/24/22 2114 11/25/22 0352 11/25/22 0500  BP: (!) 98/39 (!) 92/45 (!) 120/54   Pulse: 78 79 74   Resp:  19 16   Temp: (!) 97.5 F (36.4 C) 98.6 F (37 C) 98.9 F (37.2 C)   TempSrc:    Oral   SpO2: 90% 95% 94%   Weight:    105.8 kg  Height:        Intake/Output Summary (Last 24 hours) at 11/25/2022 1640 Last data filed at 11/25/2022 0700 Gross per 24 hour  Intake 840 ml  Output 2050 ml  Net -1210 ml   Weight change: 1 kg  Exam:  Physical Exam  Gen:- Awake Alert, in no acute distress  HEENT:- [place].AT, No sclera icterus Neck-Supple Neck,No JVD,.  Lungs-  CTAB , fair air movement bilaterally  CV- S1, S2 normal, RRR Abd-  +ve B.Sounds, Abd Soft, No tenderness, ostomy with mostly stool Extremity/Skin:- +ve  edema,   good pedal pulses  Psych-affect is appropriate, oriented x 2, episodes of disorientation and confusion persist Neuro-General weakness no new focal deficits, no tremors GU-Foley in situ   Data Reviewed: I have personally reviewed following labs and imaging studies  Basic Metabolic Panel: Recent Labs  Lab 11/20/22 0517 11/22/22 0508 11/25/22 0419  NA 131* 131* 129*  K 4.8 4.6 5.5*  CL 101 104 102  CO2 22 21* 23  GLUCOSE 68* 104* 72  BUN 22* 26* 43*  CREATININE 1.11 1.10 1.35*  CALCIUM 7.8* 7.9* 7.9*    Recent Labs  Lab 11/25/22 0419  AMMONIA 42*    CBC: Recent Labs  Lab 11/20/22 0517 11/22/22 1114 11/25/22 0419  WBC 5.7 5.4 4.5  HGB 7.9* 9.0* 7.3*  HCT 26.3* 31.3* 23.1*  MCV 111.4* 114.7* 103.1*  PLT 79* 75* 45*   Cardiac Enzymes: No results for input(s): "CKTOTAL", "CKMB", "CKMBINDEX", "TROPONINI" in the last 168 hours.  CBG: Recent Labs  Lab 11/24/22 1619 11/24/22 1638 11/24/22 2117 11/25/22 0745 11/25/22 1113  GLUCAP 61* 71 119* 77 118*   Urine analysis:  Component Value Date/Time   COLORURINE YELLOW 11/15/2022 1327   APPEARANCEUR CLEAR 11/15/2022 1327   APPEARANCEUR Cloudy (A) 08/25/2022 1417   LABSPEC 1.012 11/15/2022 1327   PHURINE 6.0 11/15/2022 1327   GLUCOSEU NEGATIVE 11/15/2022 1327   HGBUR NEGATIVE 11/15/2022 1327   [place] 11/15/2022 1327   BILIRUBINUR Negative 08/25/2022 1417    KETONESUR NEGATIVE 11/15/2022 1327   PROTEINUR NEGATIVE 11/15/2022 1327   UROBILINOGEN 0.2 10/17/2014 1940   NITRITE NEGATIVE 11/15/2022 1327   LEUKOCYTESUR NEGATIVE 11/15/2022 1327   Sepsis Labs:  Recent Results (from the past 240 hour(s))  Culture, blood (Routine X 2) w Reflex to ID Panel     Status: None   Collection Time: 11/17/22  4:52 PM   Specimen: BLOOD RIGHT HAND  Result Value Ref Range Status   Specimen Description BLOOD RIGHT HAND  Final   Special Requests   Final    BOTTLES DRAWN AEROBIC AND ANAEROBIC Blood Culture results may not be optimal due to an inadequate volume of blood received in culture bottles   Culture   Final    NO GROWTH 5 DAYS Performed at [facility], [address], [place], [place] [address]    Report Status 11/22/2022 FINAL  Final  Culture, blood (Routine X 2) w Reflex to ID Panel     Status: None   Collection Time: 11/17/22  6:30 PM   Specimen: BLOOD LEFT HAND  Result Value Ref Range Status   Specimen Description BLOOD LEFT HAND  Final   Special Requests   Final    BOTTLES DRAWN AEROBIC ONLY Blood Culture adequate volume   Culture   Final    NO GROWTH 5 DAYS Performed at [facility], [address], [place],  [address]    Report Status 11/22/2022 FINAL  Final     Scheduled Meds:  atorvastatin  80 mg Oral Daily   benztropine  0.5 mg Oral Daily   Chlorhexidine Gluconate Cloth  6 each Topical Daily   ciprofloxacin  500 mg Oral BID   divalproex  1,000 mg Oral QHS   divalproex  250 mg Oral Daily   hydroxychloroquine  200 mg Oral BID   insulin aspart  0-5 Units Subcutaneous QHS   insulin aspart  0-9 Units Subcutaneous TID WC   linezolid  600 mg Oral Q12H   midodrine  10 mg Oral TID WC   pantoprazole  40 mg Oral BID   risperiDONE  1 mg Oral QHS   risperiDONE  6 mg Oral QHS   sertraline  50 mg Oral Daily   sodium zirconium cyclosilicate  10 g Oral BID   traZODone  100 mg Oral QHS    sodium chloride      Procedures/Studies: ECHOCARDIOGRAM COMPLETE  Result Date: 11/18/2022    ECHOCARDIOGRAM REPORT   Patient Name:   TAVONE LASANTA Date of Exam: 11/18/2022 Medical Rec #:  [id number]      Height:       66.0 in Accession #:    [id number]     Weight:       228.4 lb Date of Birth:  07-28-1966     BSA:          2.116 m Patient Age:    13 years       BP:           105/56 mmHg Patient Gender: M              HR:  91 bpm. Exam Location:  [facility] Procedure: 2D Echo, Cardiac Doppler and Color Doppler Indications:    Bacteremia  History:        Patient has prior history of Echocardiogram examinations, most                 recent 11/13/2020. Cirrhosis; Risk Factors:Diabetes, Current                 Smoker and Dyslipidemia.  Sonographer:    Johny Chess RDCS Referring Phys: [phone] CARLOS MADERA  Sonographer Comments: Technically difficult study due to poor echo windows. Image acquisition challenging due to respiratory motion. IMPRESSIONS  1. Left ventricular ejection fraction, by estimation, is 60 to 65%. The left ventricle has normal function. Left ventricular endocardial border not optimally defined to evaluate regional wall motion. Left ventricular diastolic parameters were normal.  2. Right ventricular systolic function is normal. The right ventricular size is normal. Tricuspid regurgitation signal is inadequate for assessing PA pressure.  3. Left atrial size was mildly dilated.  4. The mitral valve is grossly normal. Trivial mitral valve regurgitation.  5. The aortic valve is tricuspid. Aortic valve regurgitation is not visualized.  6. The inferior vena cava is normal in size with greater than 50% respiratory variability, suggesting right atrial pressure of 3 mmHg.  7. No obvious valvular vegetations noted with significantly limited views. Comparison(s): Prior images reviewed side by side. LVEF remains normal range at 60-65%. FINDINGS  Left Ventricle: Left ventricular ejection fraction, by estimation, is 60  to 65%. The left ventricle has normal function. Left ventricular endocardial border not optimally defined to evaluate regional wall motion. The left ventricular internal cavity size was normal in size. There is no left ventricular hypertrophy. Left ventricular diastolic parameters were normal. Right Ventricle: The right ventricular size is normal. No increase in right ventricular wall thickness. Right ventricular systolic function is normal. Tricuspid regurgitation signal is inadequate for assessing PA pressure. Left Atrium: Left atrial size was mildly dilated. Right Atrium: Right atrial size was normal in size. Pericardium: There is no evidence of pericardial effusion. Presence of epicardial fat layer. Mitral Valve: The mitral valve is grossly normal. Trivial mitral valve regurgitation. Tricuspid Valve: The tricuspid valve is grossly normal. Tricuspid valve regurgitation is trivial. Aortic Valve: The aortic valve is tricuspid. There is mild aortic valve annular calcification. Aortic valve regurgitation is not visualized. Pulmonic Valve: The pulmonic valve was grossly normal. Pulmonic valve regurgitation is trivial. Aorta: The aortic root is normal in size and structure. Venous: The inferior vena cava is normal in size with greater than 50% respiratory variability, suggesting right atrial pressure of 3 mmHg. IAS/Shunts: No atrial level shunt detected by color flow Doppler.  LEFT VENTRICLE PLAX 2D LVIDd:         4.40 cm   Diastology LVIDs:         2.80 cm   LV e' medial:    9.25 cm/s LV PW:         0.90 cm   LV E/e' medial:  10.5 LV IVS:        0.70 cm   LV e' lateral:   15.30 cm/s LVOT diam:     1.80 cm   LV E/e' lateral: 6.4 LV SV:         54 LV SV Index:   26 LVOT Area:     2.54 cm  RIGHT VENTRICLE             IVC RV S  prime:     13.60 cm/s  IVC diam: 1.40 cm TAPSE (M-mode): 4.0 cm LEFT ATRIUM           Index        RIGHT ATRIUM           Index LA diam:      3.00 cm 1.42 cm/m   RA Area:     10.70 cm LA Vol  (A4C): 77.8 ml 36.77 ml/m  RA Volume:   23.60 ml  11.15 ml/m  AORTIC VALVE LVOT Vmax:   131.00 cm/s LVOT Vmean:  84.800 cm/s LVOT VTI:    0.214 m  AORTA Ao Root diam: 2.60 cm MITRAL VALVE MV Area (PHT): 3.93 cm    SHUNTS MV Decel Time: 193 msec    Systemic VTI:  0.21 m MV E velocity: 97.40 cm/s  Systemic Diam: 1.80 cm MV A velocity: 99.00 cm/s MV E/A ratio:  0.98 Rozann Lesches MD Electronically signed by Rozann Lesches MD Signature Date/Time: 11/18/2022/2:31:20 PM    Final    CT CHEST ABDOMEN PELVIS W CONTRAST  Result Date: 11/15/2022 CLINICAL DATA:  Sepsis, polytrauma, fall EXAM: CT CHEST, ABDOMEN, AND PELVIS WITH CONTRAST TECHNIQUE: Multidetector CT imaging of the chest, abdomen and pelvis was performed following the standard protocol during bolus administration of intravenous contrast. RADIATION DOSE REDUCTION: This exam was performed according to the departmental dose-optimization program which includes automated exposure control, adjustment of the mA and/or kV according to patient size and/or use of iterative reconstruction technique. CONTRAST:  120m OMNIPAQUE IOHEXOL 300 MG/ML  SOLN COMPARISON:  CT abdomen pelvis, 09/06/2022 CT chest, 04/15/2022 FINDINGS: CT CHEST FINDINGS Cardiovascular: Right chest port catheter. Aortic atherosclerosis. Normal heart size. Left coronary artery calcifications. No pericardial effusion. Mediastinum/Nodes: No enlarged mediastinal, hilar, or axillary lymph nodes. Small incidental diverticulum of the posterior right trachea at the level of the thoracic inlet (series 2, image 8). Thyroid gland and esophagus demonstrate no significant findings. Lungs/Pleura: Moderate right pleural effusion and associated atelectasis or consolidation. Musculoskeletal: No chest wall abnormality. No acute osseous findings. Status post bilateral shoulder arthroplasty. CT ABDOMEN PELVIS FINDINGS Hepatobiliary: Coarse contour of the liver. No gallstones, gallbladder wall thickening, or biliary  dilatation. Pancreas: Unremarkable. No pancreatic ductal dilatation or surrounding inflammatory changes. Spleen: Normal in size without significant abnormality. Adrenals/Urinary Tract: Adrenal glands are unremarkable. Kidneys are normal, without renal calculi, solid lesion, or hydronephrosis. Malpositioned Foley catheter, tip and balloon within the prostatic or bulbous urethra (series 5, image 111) Stomach/Bowel: Stomach is within normal limits. 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 Vascular/Lymphatic: Aortic atherosclerosis. 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 No enlarged abdominal or pelvic lymph nodes. Reproductive: No mass or other abnormality. Other: No abdominal wall hernia or abnormality. Small volume perihepatic ascites. Musculoskeletal: No acute osseous findings. 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 IMPRESSION: 1. No CT evidence of acute traumatic injury to the chest, abdomen, or pelvis. 2. Moderate right pleural effusion and associated atelectasis or consolidation. 3. Malpositioned Foley catheter, tip and balloon within the prostatic or bulbous urethra. Recommend repositioning. 4. 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 5. Small volume perihepatic ascites. 6. Coarse contour of the liver and gastric varices, suggestive of cirrhosis. 7. Coronary artery disease. Aortic Atherosclerosis (ICD10-I70.0). Electronically Signed   By: ADelanna AhmadiM.D.   On: 11/15/2022 14:32   CT Head Wo Contrast  Result Date: 11/15/2022 CLINICAL DATA:  Three falls in the past  24 hours. EXAM: CT HEAD WITHOUT CONTRAST CT CERVICAL SPINE WITHOUT CONTRAST TECHNIQUE: Multidetector CT imaging of the head and cervical spine was performed following the standard protocol without intravenous contrast. Multiplanar CT image reconstructions of the cervical spine were also generated. RADIATION DOSE REDUCTION: This exam was performed  according to the departmental dose-optimization program which includes automated exposure control, adjustment of the mA and/or kV according to patient size and/or use of iterative reconstruction technique. COMPARISON:  CT head 05/16/2021 FINDINGS: CT HEAD FINDINGS Brain: There is no acute intracranial hemorrhage, extra-axial fluid collection, or acute infarct. Parenchymal volume is normal. The ventricles are normal in size. Gray-white differentiation is preserved. The pituitary and suprasellar region are normal. There is no mass lesion. There is no mass effect or midline shift. Vascular: No hyperdense vessel or unexpected calcification. Skull: Normal. Negative for fracture or focal lesion. Sinuses/Orbits: There is mild mucosal thickening in the paranasal sinuses. The globes and orbits are unremarkable. Other: None. CT CERVICAL SPINE FINDINGS Alignment: Normal. There is no jumped or perched facet or other evidence of traumatic malalignment. Skull base and vertebrae: Skull base alignment is maintained. Vertebral body heights are preserved. There is no evidence of acute fracture. There is no suspicious osseous lesion. Soft tissues and spinal canal: No prevertebral fluid or swelling. No visible canal hematoma. Disc levels: Facet arthropathy is most advanced on the right at C3-C4. There is overall mild disc space narrowing at C4-C5 and C5-C6. There is no convincing high-grade spinal canal stenosis. Upper chest: Assessed on the separately dictated CT chest. Other: None. IMPRESSION: 1. No acute intracranial pathology. 2. No acute fracture or traumatic malalignment of the cervical spine. Electronically Signed   By: Valetta Mole M.D.   On: 11/15/2022 14:24   CT Cervical Spine Wo Contrast  Result Date: 11/15/2022 CLINICAL DATA:  Three falls in the past 24 hours. EXAM: CT HEAD WITHOUT CONTRAST CT CERVICAL SPINE WITHOUT CONTRAST TECHNIQUE: Multidetector CT imaging of the head and cervical spine was performed following the  standard protocol without intravenous contrast. Multiplanar CT image reconstructions of the cervical spine were also generated. RADIATION DOSE REDUCTION: This exam was performed according to the departmental dose-optimization program which includes automated exposure control, adjustment of the mA and/or kV according to patient size and/or use of iterative reconstruction technique. COMPARISON:  CT head 05/16/2021 FINDINGS: CT HEAD FINDINGS Brain: There is no acute intracranial hemorrhage, extra-axial fluid collection, or acute infarct. Parenchymal volume is normal. The ventricles are normal in size. Gray-white differentiation is preserved. The pituitary and suprasellar region are normal. There is no mass lesion. There is no mass effect or midline shift. Vascular: No hyperdense vessel or unexpected calcification. Skull: Normal. Negative for fracture or focal lesion. Sinuses/Orbits: There is mild mucosal thickening in the paranasal sinuses. The globes and orbits are unremarkable. Other: None. CT CERVICAL SPINE FINDINGS Alignment: Normal. There is no jumped or perched facet or other evidence of traumatic malalignment. Skull base and vertebrae: Skull base alignment is maintained. Vertebral body heights are preserved. There is no evidence of acute fracture. There is no suspicious osseous lesion. Soft tissues and spinal canal: No prevertebral fluid or swelling. No visible canal hematoma. Disc levels: Facet arthropathy is most advanced on the right at C3-C4. There is overall mild disc space narrowing at C4-C5 and C5-C6. There is no convincing high-grade spinal canal stenosis. Upper chest: Assessed on the separately dictated CT chest. Other: None. IMPRESSION: 1. No acute intracranial pathology. 2. No acute fracture or traumatic malalignment of  the cervical spine. Electronically Signed   By: Valetta Mole M.D.   On: 11/15/2022 14:24   DG Chest Port 1 View  Result Date: 11/15/2022 CLINICAL DATA:  Sepsis, possible UTI EXAM:  PORTABLE CHEST 1 VIEW COMPARISON:  CT chest dated 03/16/2022 FINDINGS: Lungs are clear.  No pleural effusion or pneumothorax. The heart is normal in size. Right chest power port terminates at the cavoatrial junction. Bilateral shoulder arthroplasty. IMPRESSION: No evidence of acute cardiopulmonary disease. Electronically Signed   By: Julian Hy M.D.   On: 11/15/2022 13:35    Renu Asby Denton Brick, MD  [facility]  If 7PM-7AM, please contact night-coverage [email or web address] Password [facility] 11/25/2022, 4:40 PM   LOS: 9 days

## 2022-11-25 NOTE — TOC Progression Note (Signed)
Transition of Care [facility]) - Progression Note    Patient Details  Name: KASEEM BENITES MRN: [id number] Date of Birth: Jul 29, 1966  [facility]) CM/SW Contact  Boneta Lucks, RN Phone Number: 11/25/2022, 11:45 AM  Clinical Narrative:   Grace Blight [phone] is his Payee through [place] visited patient today. She has been working with him for six years. Patient did not know her. She stated he was very different today. He know he name and where he lives but that is the only thing he answered correctly. She took progress notes and will call [place] for placement.     Expected Discharge Plan: Long Term Acute Care (LTAC) Barriers to Discharge: Continued Medical Work up  Expected Discharge Plan and Services  Social Determinants of Health (SDOH) Interventions SDOH Screenings   Food Insecurity: No Food Insecurity (11/23/2022)  Recent Concern: Food Insecurity - Food Insecurity Present (09/07/2022)  Housing: Low Risk  (11/23/2022)  Transportation Needs: No Transportation Needs (11/23/2022)  Utilities: Not At Risk (11/23/2022)  Tobacco Use: High Risk (11/15/2022)

## 2022-11-25 NOTE — Progress Notes (Signed)
Patient Slept through the night, no complaints of pain. Continued to monitor.

## 2022-11-25 NOTE — Therapy (Signed)
[facility] [place], [place], [address] Phone: [phone]   Fax:  [phone]  Patient Details  Name: [name] MRN: [id number] Date of Birth: 11-20-1965 Referring Provider:  No ref. provider found  Encounter Date: 11/25/2022  PHYSICAL THERAPY DISCHARGE SUMMARY  Visits from Start of Care: 3  Current functional level related to goals / functional outcomes: None pt admitted into hospital on 2/12 and continues to be in hospital.  Plan is D/C to SNF   Remaining deficits: Falls and weakness   Education / Equipment: HEP   Patient agrees to discharge. Patient goals were not met. Patient is being discharged due to a change in medical status.  Rayetta Humphrey, PT CLT [phone]  11/25/2022, 8:57 AM  [facility] at [place], [place], [address] Phone: [phone]   Fax:  [phone]

## 2022-11-26 ENCOUNTER — Inpatient Hospital Stay (HOSPITAL_COMMUNITY): Payer: Medicaid Other

## 2022-11-26 ENCOUNTER — Inpatient Hospital Stay: Payer: Medicaid Other

## 2022-11-26 DIAGNOSIS — E876 Hypokalemia: Secondary | ICD-10-CM | POA: Diagnosis not present

## 2022-11-26 LAB — HEMOGLOBIN AND HEMATOCRIT, BLOOD
HCT: 24.8 % — ABNORMAL LOW (ref 39.0–52.0)
Hemoglobin: 8.1 g/dL — ABNORMAL LOW (ref 13.0–17.0)

## 2022-11-26 LAB — URINALYSIS, ROUTINE W REFLEX MICROSCOPIC
Bilirubin Urine: NEGATIVE
Glucose, UA: NEGATIVE mg/dL
Ketones, ur: NEGATIVE mg/dL
Nitrite: NEGATIVE
Protein, ur: 30 mg/dL — AB
RBC / HPF: >50 RBC/hpf (ref 0–5)
Specific Gravity, Urine: 1.013 (ref 1.005–1.030)
WBC, UA: >50 WBC/hpf (ref 0–5)
pH: 5 (ref 5.0–8.0)

## 2022-11-26 LAB — BLOOD GAS, VENOUS
Acid-base deficit: 0.7 mmol/L (ref 0.0–2.0)
Bicarbonate: 24.2 mmol/L (ref 20.0–28.0)
Drawn by: 27160
O2 Saturation: 95.4 %
Patient temperature: 37.9
pCO2, Ven: 42 mmHg — ABNORMAL LOW (ref 44–60)
pH, Ven: 7.38 (ref 7.25–7.43)
pO2, Ven: 73 mmHg — ABNORMAL HIGH (ref 32–45)

## 2022-11-26 LAB — COMPREHENSIVE METABOLIC PANEL
ALT: 44 U/L (ref 0–44)
ALT: 52 U/L — ABNORMAL HIGH (ref 0–44)
AST: 120 U/L — ABNORMAL HIGH (ref 15–41)
AST: 131 U/L — ABNORMAL HIGH (ref 15–41)
Albumin: <1.5 g/dL — ABNORMAL LOW (ref 3.5–5.0)
Albumin: <1.5 g/dL — ABNORMAL LOW (ref 3.5–5.0)
Alkaline Phosphatase: 118 U/L (ref 38–126)
Alkaline Phosphatase: 122 U/L (ref 38–126)
Anion gap: 7 (ref 5–15)
Anion gap: 7 (ref 5–15)
BUN: 60 mg/dL — ABNORMAL HIGH (ref 6–20)
BUN: 64 mg/dL — ABNORMAL HIGH (ref 6–20)
CO2: 21 mmol/L — ABNORMAL LOW (ref 22–32)
CO2: 21 mmol/L — ABNORMAL LOW (ref 22–32)
Calcium: 7.9 mg/dL — ABNORMAL LOW (ref 8.9–10.3)
Calcium: 8.3 mg/dL — ABNORMAL LOW (ref 8.9–10.3)
Chloride: 101 mmol/L (ref 98–111)
Chloride: 101 mmol/L (ref 98–111)
Creatinine, Ser: 1.76 mg/dL — ABNORMAL HIGH (ref 0.61–1.24)
Creatinine, Ser: 1.88 mg/dL — ABNORMAL HIGH (ref 0.61–1.24)
GFR, Estimated: 45 mL/min — ABNORMAL LOW (ref >60)
GFR, Estimated: 41 mL/min — ABNORMAL LOW (ref >60)
Glucose, Bld: 95 mg/dL (ref 70–99)
Glucose, Bld: 84 mg/dL (ref 70–99)
Potassium: 6.7 mmol/L (ref 3.5–5.1)
Potassium: 6 mmol/L — ABNORMAL HIGH (ref 3.5–5.1)
Sodium: 129 mmol/L — ABNORMAL LOW (ref 135–145)
Sodium: 129 mmol/L — ABNORMAL LOW (ref 135–145)
Total Bilirubin: 1.1 mg/dL (ref 0.3–1.2)
Total Bilirubin: 1.4 mg/dL — ABNORMAL HIGH (ref 0.3–1.2)
Total Protein: 4.2 g/dL — ABNORMAL LOW (ref 6.5–8.1)
Total Protein: 5 g/dL — ABNORMAL LOW (ref 6.5–8.1)

## 2022-11-26 LAB — CBC
HCT: 21.7 % — ABNORMAL LOW (ref 39.0–52.0)
Hemoglobin: 6.8 g/dL — CL (ref 13.0–17.0)
MCH: 32.4 pg (ref 26.0–34.0)
MCHC: 31.3 g/dL (ref 30.0–36.0)
MCV: 103.3 fL — ABNORMAL HIGH (ref 80.0–100.0)
Platelets: 51 10*3/uL — ABNORMAL LOW (ref 150–400)
RBC: 2.1 MIL/uL — ABNORMAL LOW (ref 4.22–5.81)
RDW: 17.8 % — ABNORMAL HIGH (ref 11.5–15.5)
WBC: 5.4 10*3/uL (ref 4.0–10.5)
nRBC: 0 % (ref 0.0–0.2)

## 2022-11-26 LAB — GLUCOSE, CAPILLARY
Glucose-Capillary: 96 mg/dL (ref 70–99)
Glucose-Capillary: 89 mg/dL (ref 70–99)
Glucose-Capillary: 118 mg/dL — ABNORMAL HIGH (ref 70–99)
Glucose-Capillary: 93 mg/dL (ref 70–99)

## 2022-11-26 LAB — TROPONIN I (HIGH SENSITIVITY)
Troponin I (High Sensitivity): 7 ng/L (ref <18)
Troponin I (High Sensitivity): 8 ng/L (ref <18)

## 2022-11-26 LAB — MRSA NEXT GEN BY PCR, NASAL: MRSA by PCR Next Gen: NOT DETECTED

## 2022-11-26 LAB — PREPARE RBC (CROSSMATCH)

## 2022-11-26 LAB — AMMONIA: Ammonia: 52 umol/L — ABNORMAL HIGH (ref 9–35)

## 2022-11-26 MED ORDER — SODIUM CHLORIDE 0.9 % IV SOLN: INTRAVENOUS | Status: DC

## 2022-11-26 MED ORDER — ORAL CARE MOUTH RINSE
15.0000 mL | OROMUCOSAL | Status: DC
  Administered 2022-11-26 – 2022-12-01 (×19): 15 mL via OROMUCOSAL

## 2022-11-26 MED ORDER — SODIUM CHLORIDE 0.9% IV SOLUTION: Freq: Once | INTRAVENOUS | Status: DC

## 2022-11-26 MED ORDER — INSULIN ASPART 100 UNIT/ML IV SOLN
10.0000 [IU] | Freq: Once | INTRAVENOUS | Status: AC
  Administered 2022-11-26: 10 [IU] via INTRAVENOUS

## 2022-11-26 MED ORDER — DEXTROSE 50 % IV SOLN
25.0000 g | Freq: Once | INTRAVENOUS | Status: AC
  Administered 2022-11-26: 25 g via INTRAVENOUS
  Filled 2022-11-26: qty 50

## 2022-11-26 MED ORDER — ORAL CARE MOUTH RINSE: 15.0000 mL | OROMUCOSAL | Status: DC | PRN

## 2022-11-26 MED ORDER — SODIUM POLYSTYRENE SULFONATE 15 GM/60ML PO SUSP
60.0000 g | Freq: Once | ORAL | Status: AC
  Administered 2022-11-26: 60 g via RECTAL
  Filled 2022-11-26: qty 240

## 2022-11-26 MED ORDER — SODIUM POLYSTYRENE SULFONATE 15 GM/60ML PO SUSP
30.0000 g | Freq: Once | ORAL | Status: AC
  Administered 2022-11-26: 30 g via ORAL
  Filled 2022-11-26: qty 120

## 2022-11-26 MED ORDER — CALCIUM GLUCONATE-NACL 1-0.675 GM/50ML-% IV SOLN
1.0000 g | Freq: Once | INTRAVENOUS | Status: AC
  Administered 2022-11-26: 1000 mg via INTRAVENOUS
  Filled 2022-11-26: qty 50

## 2022-11-26 NOTE — Progress Notes (Signed)
Patient slept through the night during this shift. No complaints of pain or discomfort. Plan of care ongoing.

## 2022-11-26 NOTE — Progress Notes (Signed)
   11/26/22 0700  Vitals  Temp 100.2 F (37.9 C)  Temp Source Axillary  BP (!) 111/55  BP Location Left Wrist  BP Method Automatic  Patient Position (if appropriate) Lying  Pulse Rate Source Dinamap  Resp (!) 21  Level of Consciousness  Level of Consciousness Responds to Voice  Oxygen Therapy  SpO2 (!) 88 %  O2 Device Room Air    Rapid response called d/t pt unresponsive only responds to voice. Dr. Manuella Ghazi at bedside. New orders put in.

## 2022-11-26 NOTE — Progress Notes (Signed)
Date and time results received: 11/26/22 0730   Test: Hbg  Critical Value: 6.8  Name of Provider Notified: Dr. Joesph Fillers.

## 2022-11-26 NOTE — TOC Progression Note (Signed)
Transition of Care [facility]) - Progression Note    Patient Details  Name: [name] MRN: [id number] Date of Birth: [birth year]  [facility]) CM/SW Contact  Boneta Lucks, RN Phone Number: 11/26/2022, 2:21 PM  Clinical Narrative:   MD talking with sister, [name], regarding code status. [name] lives in [place]. She states his niece, lives 45 minutes away and is coming Sunday to visit, [name]). We have permission to talk to her.  They will discuss code status. Sister given the number to ICU, in the event she want to call and change his code status before her cousin can visit.   Expected Discharge Plan: Long Term Acute Care (LTAC) Barriers to Discharge: Continued Medical Work up  Expected Discharge Plan and Services         Social Determinants of Health (SDOH) Interventions SDOH Screenings   Food Insecurity: No Food Insecurity (11/23/2022)  Recent Concern: Food Insecurity - Food Insecurity Present (09/07/2022)  Housing: Low Risk  (11/23/2022)  Transportation Needs: No Transportation Needs (11/23/2022)  Utilities: Not At Risk (11/23/2022)  Tobacco Use: High Risk (11/15/2022)    Readmission Risk Interventions     No data to display

## 2022-11-26 NOTE — TOC Progression Note (Signed)
Transition of Care [facility]) - Progression Note    Patient Details  Name: [name] MRN: [id number] Date of Birth: 1965/11/11  Transition of Care [facility]) CM/SW Contact  Boneta Lucks, RN Phone Number: 11/26/2022, 10:49 AM  Clinical Narrative:   [name] with [employer] to update. Patient was moved to the unit. Confirming they are working on a [name]. She state his sister is next of kin and is willing to be his guardian. They are waiting for a court date. She is next of kin, so will be his decision maker at this time. She consented for blood this morning.     Expected Discharge Plan: Long Term Acute Care (LTAC) Barriers to Discharge: Continued Medical Work up  Expected Discharge Plan and Services      Social Determinants of Health (SDOH) Interventions SDOH Screenings   Food Insecurity: No Food Insecurity (11/23/2022)  Recent Concern: Food Insecurity - Food Insecurity Present (09/07/2022)  Housing: Low Risk  (11/23/2022)  Transportation Needs: No Transportation Needs (11/23/2022)  Utilities: Not At Risk (11/23/2022)  Tobacco Use: High Risk (11/15/2022)    Readmission Risk Interventions     No data to display

## 2022-11-26 NOTE — Progress Notes (Signed)
PROGRESS NOTE  [name] [id number] DOB: 05-01-1966 DOA: 11/15/2022 PCP: [facility]  Brief History:  57 year old male with a history of diabetes mellitus type 2, pancytopenia, recurrent DVT/PE on Eliquis, RA, Crohn's colitis status post colectomy with ileostomy 2009 presenting with multiple falls.  The patient is a poor historian. Patient was seen on 05 November 2022 by liver transplant at [facility].  He was noted to have nonalcoholic fatty liver disease along with cirrhosis.  Autoimmune workup was started.  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  Has not had follow-up yet.   Patient been taking Demadex 20 mg a day prescribed by his cardiologist due to lower extremity edema.  Patient has not been started on Aldactone yet. This was mentioned during his hepatology consult. Patient states that he is taking Aldactone but there is no recording of this in his chart.   The patient was recently hospitalized from 09/06/2022 to 09/09/2022 for GI bleed.  EGD  09/07/22 with 1cm hh, Erythematous mucosa in the gastric fundus. Treated w/ (APC).GAVE w/ bleeding. Treated w/ (APC). Erythematous duodenopathy.  The patient was discharged home with pantoprazole twice daily.  In the ED, the patient was noted to be tachycardic in the 110s.  He was hypotensive with systolic blood pressure 83.  The patient was given 3.5 L of fluid.  Lactic acid was 3.7.  He was admitted for further evaluation and treatment of SIRS and altered mentation    Assessment/Plan: 1)Acute Metabolic Encephalopathy---suspect due to underlying bacteremia compounded by Liver cirrhosis  -CT brain negative -Positive bacteremia as mentioned below -Continue current antibiotics and supportive care.. 11/26/22 -Episode of increased lethargy and unresponsiveness, patient was transferred to stepdown unit -Ammonia  66  >> 42 >> 52 -UA with possible UTI----patient has an indwelling Foley so we remove  Foley get a clean urine sample and replace Foley again -VBG reassuring  2)AKI----acute kidney injury -Creatinine is up to 1.76----suspect dehydration related -renally adjust medications, avoid nephrotoxic agents / dehydration  / hypotension  3)HYperkalemia----potassium 6.7  >>6 in the  -Patient received hyperkalemia cocktail--including calcium gluconate, D50 insulin and Lokelma -Okay to give additional Kayexalate  4)Liver cirrhosis secondary to nonalcoholic steatohepatitis (NASH)  -Pt was seen by transplant hepatology at [facility] on 11-05-2022. He has outpatient labs that showed + ANA and + anti-smooth muscle antibody.  -This calls into question whether his cirrhosis is caused by autoimmune hepatitis. -Continue outpatient follow-up with GI service -Continue holding Aldactone and Demadex for now. 11/26/22 -Ammonia  66  >> 42 >> 52 -LFTs trending up    Latest Ref Rng & Units 11/26/2022   12:55 PM 11/26/2022    6:30 AM 11/25/2022    4:19 AM  Hepatic Function  Total Protein 6.5 - 8.1 g/dL 5.0  4.2  4.4   Albumin 3.5 - 5.0 g/dL <1.5  <1.5  <1.5   AST 15 - 41 U/L 131  120  133   ALT 0 - 44 U/L 52  44  48   Alk Phosphatase 38 - 126 U/L 122  118  108   Total Bilirubin 0.3 - 1.2 mg/dL 1.4  1.1  1.1     5)Urinary retention -Foley catheter placed in the ED on admission -CT shows tip of the catheter is in the prostatic urethra---urology contacted,, Foley was repositioned --Patient denying dysuria and suprapubic tenderness at this moment. -Discussed with Dr. Alyson Ingles from urology service recommends  leaving Foley catheter in until patient can follow-up with him in the office  6) social/ethics--I called and spoke with sister, [name], regarding code status. [name] lives in [place].  -She is a patient next of kin and presumed decision maker at this time -Patient remains a full code at this time  7)Episode of hypotension--hold metoprolol -BP improving with discontinuation of metoprolol and  initiation of midodrine -Continue midodrine  8) acute on chronic anemia--- Hgb down to 6.8, blood pressure soft -Patient with underlying liver cirrhosis, no acute bleeding noted-- -Obtain consent from patient's sister and transfuse 1 unit of PRBC on 11/26/2022  Lactic acidosis/enterococcal and Acinetobacter bacteremia -Suspect this is due to decreased clearance from his hepatic cirrhosis, versus associated with active infection. -Following ID recommendations 2D echo performed; no vegetations appreciated.  Holding on TEE at this moment -Based on cultures sensitivity antibiotics will be narrow to Cipro and Zyvox with plans for 2 weeks of therapy.  Mild hyponatremia -Most likely associated with chronic cirrhosis and dehydration -Improved and pretty much back to baseline after fluid resuscitation provided -Continue to follow electrolytes trend.  Crohn's disease of both small and large intestine with complication ([place]) -Pt with ileostomy.  -Pt denies any high output from ileostomy -Continue to maintain adequate hydration.  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  Bipolar -continue depakote, risperdal, sertraline, benztropine -Overall mood is stable currently. -Following commands appropriately.  Mixed Hyperlipidemia -continue statin -Heart healthy diet discussed with patient.  History DVTs/PE -11/26/22 Hold Eliquis as Hgb and platelets are drifting down  Pancytopenia -chronic. -Continue patient follow-up with hematology service. ---No overt bleeding appreciated. --11/26/22 Hold Eliquis as Hgb and platelets are drifting down Zyvox could be contributing to thrombocytopenia  Class 2 Obese -BMI 37.04 -Low-calorie diet and portion control discussed with patient.  Frequent falls/failure to thrive/generalized weakness and deconditioning -TSH, B12, folate and ammonia within normal  limits -Physical therapy has recommended skilled nursing facility for rehab at discharge --Multifactorial including deconditioning, hypokalemia, possible infectious process. -Following recommendations by infectious disease service will treat for 2 weeks using oral antibiotics (ciprofloxacin and Zyvox). -Follow physical therapy evaluation/recommendations. -Currently with recommendation for a skilled nursing facility at discharge once medically stable.  Family Communication:   -  Unable to reach patient's sister who lives out of state on available phone number.---Left voicemail for sister at [phone] -Discussed with [name] , RN  and Ms [name] (from [facility]) previously  Consultants: Infectious disease/urology  Code Status:  FULL   DVT Prophylaxis:  SCDs--- Eliquis on hold due to drop in Hgb and platelets   Procedures: As Listed in Progress Note Above  Antibiotics: Ciprofloxacin and Zyvox  Subjective: - No bleeding concerns Episode of increased lethargy and unresponsiveness, patient was transferred to stepdown unit - After transfusion of PRBC patient becoming more awake  Objective: Vitals:   11/26/22 1100 11/26/22 1200 11/26/22 1400 11/26/22 1500  BP: (!) 88/45 (!) 101/44 (!) 106/39 (!) 129/51  Pulse: 89 86 86 92  Resp: '15 17 18 17  '$ Temp:  98.1 F (36.7 C)  97.8 F (36.6 C)  TempSrc:  Axillary  Axillary  SpO2: 97% 99% 100% 98%  Weight:      Height:        Intake/Output Summary (Last 24 hours) at 11/26/2022 1759 Last data filed at 11/26/2022 1600 Gross per 24 hour  Intake 1195.77 ml  Output 1575 ml  Net -379.23 ml  Weight change: -0.2 kg  Exam:  Physical Exam  Gen:-Lethargic on and off, cooperative overall  HEENT:- [place].AT, No sclera icterus Neck-Supple Neck,No JVD,.  Lungs-  CTAB , fair air movement bilaterally  CV- S1, S2 normal, RRR Abd-  +ve B.Sounds, Abd Soft, No tenderness, ostomy with mostly stool Extremity/Skin:- +ve  edema,   good pedal  pulses  Psych-affect is flat,, oriented x 2, episodes of disorientation and confusion persist Neuro-General weakness no new focal deficits, no tremors GU-Foley in situ--- to be changed on 11/26/2022   Data Reviewed: I have personally reviewed following labs and imaging studies  Basic Metabolic Panel: Recent Labs  Lab 11/20/22 0517 11/22/22 0508 11/25/22 0419 11/26/22 0630 11/26/22 1255  NA 131* 131* 129* 129* 129*  K 4.8 4.6 5.5* 6.7* 6.0*  CL 101 104 102 101 101  CO2 22 21* 23 21* 21*  GLUCOSE 68* 104* 72 95 84  BUN 22* 26* 43* 60* 64*  CREATININE 1.11 1.10 1.35* 1.76* 1.88*  CALCIUM 7.8* 7.9* 7.9* 7.9* 8.3*    Recent Labs  Lab 11/25/22 0419 11/26/22 0816  AMMONIA 42* 52*    CBC: Recent Labs  Lab 11/20/22 0517 11/22/22 1114 11/25/22 0419 11/26/22 0630  WBC 5.7 5.4 4.5 5.4  HGB 7.9* 9.0* 7.3* 6.8*  HCT 26.3* 31.3* 23.1* 21.7*  MCV 111.4* 114.7* 103.1* 103.3*  PLT 79* 75* 45* 51*   CBG: Recent Labs  Lab 11/25/22 1720 11/25/22 2219 11/26/22 0737 11/26/22 1113 11/26/22 1541  GLUCAP 82 72 96 89 118*   Urine analysis:    Component Value Date/Time   COLORURINE AMBER (A) 11/26/2022 0743   APPEARANCEUR CLOUDY (A) 11/26/2022 0743   APPEARANCEUR Cloudy (A) 08/25/2022 1417   LABSPEC 1.013 11/26/2022 0743   PHURINE 5.0 11/26/2022 0743   GLUCOSEU NEGATIVE 11/26/2022 0743   HGBUR LARGE (A) 11/26/2022 0743   BILIRUBINUR NEGATIVE 11/26/2022 0743   BILIRUBINUR Negative 08/25/2022 1417   KETONESUR NEGATIVE 11/26/2022 0743   PROTEINUR 30 (A) 11/26/2022 0743   UROBILINOGEN 0.2 10/17/2014 1940   NITRITE NEGATIVE 11/26/2022 0743   LEUKOCYTESUR MODERATE (A) 11/26/2022 0743   Sepsis Labs:  Recent Results (from the past 240 hour(s))  Culture, blood (Routine X 2) w Reflex to ID Panel     Status: None   Collection Time: 11/17/22  4:52 PM   Specimen: BLOOD RIGHT HAND  Result Value Ref Range Status   Specimen Description BLOOD RIGHT HAND  Final   Special Requests    Final    BOTTLES DRAWN AEROBIC AND ANAEROBIC Blood Culture results may not be optimal due to an inadequate volume of blood received in culture bottles   Culture   Final    NO GROWTH 5 DAYS Performed at [facility], [address], [place], [place] [address]    Report Status 11/22/2022 FINAL  Final  Culture, blood (Routine X 2) w Reflex to ID Panel     Status: None   Collection Time: 11/17/22  6:30 PM   Specimen: BLOOD LEFT HAND  Result Value Ref Range Status   Specimen Description BLOOD LEFT HAND  Final   Special Requests   Final    BOTTLES DRAWN AEROBIC ONLY Blood Culture adequate volume   Culture   Final    NO GROWTH 5 DAYS Performed at [facility], [address], [place], [place] [address]    Report Status 11/22/2022 FINAL  Final  MRSA Next Gen by PCR, Nasal     Status: None  Collection Time: 11/26/22  9:49 AM   Specimen: Nasal Mucosa; Nasal Swab  Result Value Ref Range Status   MRSA by PCR Next Gen NOT DETECTED NOT DETECTED Final    Comment: (NOTE) The GeneXpert MRSA Assay (FDA approved for NASAL specimens only), is one component of a comprehensive MRSA colonization surveillance program. It is not intended to diagnose MRSA infection nor to guide or monitor treatment for MRSA infections. Test performance is not FDA approved in patients less than 4 years old. Performed at [facility], [address] [place], [place] [address]      Scheduled Meds:  sodium chloride   Intravenous Once   atorvastatin  80 mg Oral Daily   benztropine  0.5 mg Oral Daily   Chlorhexidine Gluconate Cloth  6 each Topical Daily   ciprofloxacin  500 mg Oral BID   divalproex  1,000 mg Oral QHS   divalproex  250 mg Oral Daily   hydroxychloroquine  200 mg Oral BID   insulin aspart  0-5 Units Subcutaneous QHS   insulin aspart  0-9 Units Subcutaneous TID WC   linezolid  600 mg Oral Q12H   midodrine  10 mg Oral TID WC   mouth rinse  15 mL Mouth Rinse 4 times per day   pantoprazole  40 mg Oral BID    risperiDONE  1 mg Oral QHS   risperiDONE  6 mg Oral QHS   sertraline  50 mg Oral Daily   sodium zirconium cyclosilicate  10 g Oral BID   traZODone  100 mg Oral QHS    sodium chloride 100 mL/hr at 11/26/22 0846   sodium chloride     Procedures/Studies: DG CHEST PORT 1 VIEW  Result Date: 11/26/2022 CLINICAL DATA:  Respiratory distress EXAM: PORTABLE CHEST - 1 VIEW COMPARISON:  11/15/2022 FINDINGS: The right IJ port catheter is fractured near the right IJ entry site and should not be used. Slight worsening of pulmonary vascular congestion, interstitial and alveolar opacities throughout the left lung. Heart size and mediastinal contours are within normal limits. Aortic Atherosclerosis (ICD10-170.0). Small right pleural effusion. Right shoulder arthroplasty component partially visualized. IMPRESSION: 1. Worsening left lung edema/infiltrate. 2. Fractured right IJ port catheter, recommend removal to avoid catheter fragment complications. Electronically Signed   By: Lucrezia Europe M.D.   On: 11/26/2022 08:15   ECHOCARDIOGRAM COMPLETE  Result Date: 11/18/2022    ECHOCARDIOGRAM REPORT   Patient Name:   FLEMING MATEL Date of Exam: 11/18/2022 Medical Rec #:  [id number]      Height:       66.0 in Accession #:    [id number]     Weight:       228.4 lb Date of Birth:  1966/05/28     BSA:          2.116 m Patient Age:    89 years       BP:           105/56 mmHg Patient Gender: M              HR:           91 bpm. Exam Location:  [facility] Procedure: 2D Echo, Cardiac Doppler and Color Doppler Indications:    Bacteremia  History:        Patient has prior history of Echocardiogram examinations, most                 recent 11/13/2020. Cirrhosis; Risk Factors:Diabetes, Current  Smoker and Dyslipidemia.  Sonographer:    Johny Chess RDCS Referring Phys: [phone] CARLOS MADERA  Sonographer Comments: Technically difficult study due to poor echo windows. Image acquisition challenging due to respiratory motion.  IMPRESSIONS  1. Left ventricular ejection fraction, by estimation, is 60 to 65%. The left ventricle has normal function. Left ventricular endocardial border not optimally defined to evaluate regional wall motion. Left ventricular diastolic parameters were normal.  2. Right ventricular systolic function is normal. The right ventricular size is normal. Tricuspid regurgitation signal is inadequate for assessing PA pressure.  3. Left atrial size was mildly dilated.  4. The mitral valve is grossly normal. Trivial mitral valve regurgitation.  5. The aortic valve is tricuspid. Aortic valve regurgitation is not visualized.  6. The inferior vena cava is normal in size with greater than 50% respiratory variability, suggesting right atrial pressure of 3 mmHg.  7. No obvious valvular vegetations noted with significantly limited views. Comparison(s): Prior images reviewed side by side. LVEF remains normal range at 60-65%. FINDINGS  Left Ventricle: Left ventricular ejection fraction, by estimation, is 60 to 65%. The left ventricle has normal function. Left ventricular endocardial border not optimally defined to evaluate regional wall motion. The left ventricular internal cavity size was normal in size. There is no left ventricular hypertrophy. Left ventricular diastolic parameters were normal. Right Ventricle: The right ventricular size is normal. No increase in right ventricular wall thickness. Right ventricular systolic function is normal. Tricuspid regurgitation signal is inadequate for assessing PA pressure. Left Atrium: Left atrial size was mildly dilated. Right Atrium: Right atrial size was normal in size. Pericardium: There is no evidence of pericardial effusion. Presence of epicardial fat layer. Mitral Valve: The mitral valve is grossly normal. Trivial mitral valve regurgitation. Tricuspid Valve: The tricuspid valve is grossly normal. Tricuspid valve regurgitation is trivial. Aortic Valve: The aortic valve is tricuspid.  There is mild aortic valve annular calcification. Aortic valve regurgitation is not visualized. Pulmonic Valve: The pulmonic valve was grossly normal. Pulmonic valve regurgitation is trivial. Aorta: The aortic root is normal in size and structure. Venous: The inferior vena cava is normal in size with greater than 50% respiratory variability, suggesting right atrial pressure of 3 mmHg. IAS/Shunts: No atrial level shunt detected by color flow Doppler.  LEFT VENTRICLE PLAX 2D LVIDd:         4.40 cm   Diastology LVIDs:         2.80 cm   LV e' medial:    9.25 cm/s LV PW:         0.90 cm   LV E/e' medial:  10.5 LV IVS:        0.70 cm   LV e' lateral:   15.30 cm/s LVOT diam:     1.80 cm   LV E/e' lateral: 6.4 LV SV:         54 LV SV Index:   26 LVOT Area:     2.54 cm  RIGHT VENTRICLE             IVC RV S prime:     13.60 cm/s  IVC diam: 1.40 cm TAPSE (M-mode): 4.0 cm LEFT ATRIUM           Index        RIGHT ATRIUM           Index LA diam:      3.00 cm 1.42 cm/m   RA Area:     10.70 cm LA Vol (A4C): 77.8  ml 36.77 ml/m  RA Volume:   23.60 ml  11.15 ml/m  AORTIC VALVE LVOT Vmax:   131.00 cm/s LVOT Vmean:  84.800 cm/s LVOT VTI:    0.214 m  AORTA Ao Root diam: 2.60 cm MITRAL VALVE MV Area (PHT): 3.93 cm    SHUNTS MV Decel Time: 193 msec    Systemic VTI:  0.21 m MV E velocity: 97.40 cm/s  Systemic Diam: 1.80 cm MV A velocity: 99.00 cm/s MV E/A ratio:  0.98 Rozann Lesches MD Electronically signed by Rozann Lesches MD Signature Date/Time: 11/18/2022/2:31:20 PM    Final    CT CHEST ABDOMEN PELVIS W CONTRAST  Result Date: 11/15/2022 CLINICAL DATA:  Sepsis, polytrauma, fall EXAM: CT CHEST, ABDOMEN, AND PELVIS WITH CONTRAST TECHNIQUE: Multidetector CT imaging of the chest, abdomen and pelvis was performed following the standard protocol during bolus administration of intravenous contrast. RADIATION DOSE REDUCTION: This exam was performed according to the departmental dose-optimization program which includes automated  exposure control, adjustment of the mA and/or kV according to patient size and/or use of iterative reconstruction technique. CONTRAST:  125m OMNIPAQUE IOHEXOL 300 MG/ML  SOLN COMPARISON:  CT abdomen pelvis, 09/06/2022 CT chest, 04/15/2022 FINDINGS: CT CHEST FINDINGS Cardiovascular: Right chest port catheter. Aortic atherosclerosis. Normal heart size. Left coronary artery calcifications. No pericardial effusion. Mediastinum/Nodes: No enlarged mediastinal, hilar, or axillary lymph nodes. Small incidental diverticulum of the posterior right trachea at the level of the thoracic inlet (series 2, image 8). Thyroid gland and esophagus demonstrate no significant findings. Lungs/Pleura: Moderate right pleural effusion and associated atelectasis or consolidation. Musculoskeletal: No chest wall abnormality. No acute osseous findings. Status post bilateral shoulder arthroplasty. CT ABDOMEN PELVIS FINDINGS Hepatobiliary: Coarse contour of the liver. No gallstones, gallbladder wall thickening, or biliary dilatation. Pancreas: Unremarkable. No pancreatic ductal dilatation or surrounding inflammatory changes. Spleen: Normal in size without significant abnormality. Adrenals/Urinary Tract: Adrenal glands are unremarkable. Kidneys are normal, without renal calculi, solid lesion, or hydronephrosis. Malpositioned Foley catheter, tip and balloon within the prostatic or bulbous urethra (series 5, image 111) Stomach/Bowel: Stomach is within normal limits. 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 Vascular/Lymphatic: Aortic atherosclerosis. 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 No enlarged abdominal or pelvic lymph nodes. Reproductive: No mass or other abnormality. Other: No abdominal wall hernia or abnormality. Small volume perihepatic ascites. Musculoskeletal: No acute osseous findings. 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 IMPRESSION: 1. No CT evidence of acute  traumatic injury to the chest, abdomen, or pelvis. 2. Moderate right pleural effusion and associated atelectasis or consolidation. 3. Malpositioned Foley catheter, tip and balloon within the prostatic or bulbous urethra. Recommend repositioning. 4. 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 5. Small volume perihepatic ascites. 6. Coarse contour of the liver and gastric varices, suggestive of cirrhosis. 7. Coronary artery disease. Aortic Atherosclerosis (ICD10-I70.0). Electronically Signed   By: ADelanna AhmadiM.D.   On: 11/15/2022 14:32   CT Head Wo Contrast  Result Date: 11/15/2022 CLINICAL DATA:  Three falls in the past 24 hours. EXAM: CT HEAD WITHOUT CONTRAST CT CERVICAL SPINE WITHOUT CONTRAST TECHNIQUE: Multidetector CT imaging of the head and cervical spine was performed following the standard protocol without intravenous contrast. Multiplanar CT image reconstructions of the cervical spine were also generated. RADIATION DOSE REDUCTION: This exam was performed according to the departmental dose-optimization program which includes automated exposure control, adjustment of the mA and/or kV according to patient size and/or use of iterative  reconstruction technique. COMPARISON:  CT head 05/16/2021 FINDINGS: CT HEAD FINDINGS Brain: There is no acute intracranial hemorrhage, extra-axial fluid collection, or acute infarct. Parenchymal volume is normal. The ventricles are normal in size. Gray-white differentiation is preserved. The pituitary and suprasellar region are normal. There is no mass lesion. There is no mass effect or midline shift. Vascular: No hyperdense vessel or unexpected calcification. Skull: Normal. Negative for fracture or focal lesion. Sinuses/Orbits: There is mild mucosal thickening in the paranasal sinuses. The globes and orbits are unremarkable. Other: None. CT CERVICAL SPINE FINDINGS Alignment: Normal. There is no jumped or perched facet or other evidence of traumatic  malalignment. Skull base and vertebrae: Skull base alignment is maintained. Vertebral body heights are preserved. There is no evidence of acute fracture. There is no suspicious osseous lesion. Soft tissues and spinal canal: No prevertebral fluid or swelling. No visible canal hematoma. Disc levels: Facet arthropathy is most advanced on the right at C3-C4. There is overall mild disc space narrowing at C4-C5 and C5-C6. There is no convincing high-grade spinal canal stenosis. Upper chest: Assessed on the separately dictated CT chest. Other: None. IMPRESSION: 1. No acute intracranial pathology. 2. No acute fracture or traumatic malalignment of the cervical spine. Electronically Signed   By: Valetta Mole M.D.   On: 11/15/2022 14:24   CT Cervical Spine Wo Contrast  Result Date: 11/15/2022 CLINICAL DATA:  Three falls in the past 24 hours. EXAM: CT HEAD WITHOUT CONTRAST CT CERVICAL SPINE WITHOUT CONTRAST TECHNIQUE: Multidetector CT imaging of the head and cervical spine was performed following the standard protocol without intravenous contrast. Multiplanar CT image reconstructions of the cervical spine were also generated. RADIATION DOSE REDUCTION: This exam was performed according to the departmental dose-optimization program which includes automated exposure control, adjustment of the mA and/or kV according to patient size and/or use of iterative reconstruction technique. COMPARISON:  CT head 05/16/2021 FINDINGS: CT HEAD FINDINGS Brain: There is no acute intracranial hemorrhage, extra-axial fluid collection, or acute infarct. Parenchymal volume is normal. The ventricles are normal in size. Gray-white differentiation is preserved. The pituitary and suprasellar region are normal. There is no mass lesion. There is no mass effect or midline shift. Vascular: No hyperdense vessel or unexpected calcification. Skull: Normal. Negative for fracture or focal lesion. Sinuses/Orbits: There is mild mucosal thickening in the  paranasal sinuses. The globes and orbits are unremarkable. Other: None. CT CERVICAL SPINE FINDINGS Alignment: Normal. There is no jumped or perched facet or other evidence of traumatic malalignment. Skull base and vertebrae: Skull base alignment is maintained. Vertebral body heights are preserved. There is no evidence of acute fracture. There is no suspicious osseous lesion. Soft tissues and spinal canal: No prevertebral fluid or swelling. No visible canal hematoma. Disc levels: Facet arthropathy is most advanced on the right at C3-C4. There is overall mild disc space narrowing at C4-C5 and C5-C6. There is no convincing high-grade spinal canal stenosis. Upper chest: Assessed on the separately dictated CT chest. Other: None. IMPRESSION: 1. No acute intracranial pathology. 2. No acute fracture or traumatic malalignment of the cervical spine. Electronically Signed   By: Valetta Mole M.D.   On: 11/15/2022 14:24   DG Chest Port 1 View  Result Date: 11/15/2022 CLINICAL DATA:  Sepsis, possible UTI EXAM: PORTABLE CHEST 1 VIEW COMPARISON:  CT chest dated 03/16/2022 FINDINGS: Lungs are clear.  No pleural effusion or pneumothorax. The heart is normal in size. Right chest power port terminates at the cavoatrial junction. Bilateral shoulder arthroplasty. IMPRESSION:  No evidence of acute cardiopulmonary disease. Electronically Signed   By: Julian Hy M.D.   On: 11/15/2022 13:35    Silver Achey Denton Brick, MD  [facility]  If 7PM-7AM, please contact night-coverage [email or web address] Password [facility] 11/26/2022, 5:59 PM   LOS: 10 days

## 2022-11-26 NOTE — Progress Notes (Signed)
Date and time results received: 11/26/22 0730   Test: K+ Critical Value: 6.7  Name of Provider Notified: Dr. Joesph Fillers

## 2022-11-27 DIAGNOSIS — E876 Hypokalemia: Secondary | ICD-10-CM | POA: Diagnosis not present

## 2022-11-27 LAB — CBC WITH DIFFERENTIAL/PLATELET
Abs Immature Granulocytes: 0.03 10*3/uL (ref 0.00–0.07)
Basophils Absolute: 0 10*3/uL (ref 0.0–0.1)
Basophils Relative: 0 %
Eosinophils Absolute: 0 10*3/uL (ref 0.0–0.5)
Eosinophils Relative: 0 %
HCT: 27.6 % — ABNORMAL LOW (ref 39.0–52.0)
Hemoglobin: 8.8 g/dL — ABNORMAL LOW (ref 13.0–17.0)
Immature Granulocytes: 0 %
Lymphocytes Relative: 12 %
Lymphs Abs: 1.1 10*3/uL (ref 0.7–4.0)
MCH: 32.1 pg (ref 26.0–34.0)
MCHC: 31.9 g/dL (ref 30.0–36.0)
MCV: 100.7 fL — ABNORMAL HIGH (ref 80.0–100.0)
Monocytes Absolute: 1.1 10*3/uL — ABNORMAL HIGH (ref 0.1–1.0)
Monocytes Relative: 12 %
Neutro Abs: 6.8 10*3/uL (ref 1.7–7.7)
Neutrophils Relative %: 76 %
Platelets: 36 10*3/uL — ABNORMAL LOW (ref 150–400)
RBC: 2.74 MIL/uL — ABNORMAL LOW (ref 4.22–5.81)
RDW: 19.3 % — ABNORMAL HIGH (ref 11.5–15.5)
WBC: 9 10*3/uL (ref 4.0–10.5)
nRBC: 0 % (ref 0.0–0.2)

## 2022-11-27 LAB — GLUCOSE, CAPILLARY
Glucose-Capillary: 59 mg/dL — ABNORMAL LOW (ref 70–99)
Glucose-Capillary: 84 mg/dL (ref 70–99)
Glucose-Capillary: 96 mg/dL (ref 70–99)
Glucose-Capillary: 70 mg/dL (ref 70–99)
Glucose-Capillary: 59 mg/dL — ABNORMAL LOW (ref 70–99)
Glucose-Capillary: 121 mg/dL — ABNORMAL HIGH (ref 70–99)
Glucose-Capillary: 110 mg/dL — ABNORMAL HIGH (ref 70–99)
Glucose-Capillary: 105 mg/dL — ABNORMAL HIGH (ref 70–99)

## 2022-11-27 LAB — COMPREHENSIVE METABOLIC PANEL
ALT: 50 U/L — ABNORMAL HIGH (ref 0–44)
AST: 132 U/L — ABNORMAL HIGH (ref 15–41)
Albumin: <1.5 g/dL — ABNORMAL LOW (ref 3.5–5.0)
Alkaline Phosphatase: 121 U/L (ref 38–126)
Anion gap: 9 (ref 5–15)
BUN: 66 mg/dL — ABNORMAL HIGH (ref 6–20)
CO2: 18 mmol/L — ABNORMAL LOW (ref 22–32)
Calcium: 8.3 mg/dL — ABNORMAL LOW (ref 8.9–10.3)
Chloride: 106 mmol/L (ref 98–111)
Creatinine, Ser: 1.77 mg/dL — ABNORMAL HIGH (ref 0.61–1.24)
GFR, Estimated: 44 mL/min — ABNORMAL LOW (ref >60)
Glucose, Bld: 68 mg/dL — ABNORMAL LOW (ref 70–99)
Potassium: 5.9 mmol/L — ABNORMAL HIGH (ref 3.5–5.1)
Sodium: 133 mmol/L — ABNORMAL LOW (ref 135–145)
Total Bilirubin: 2.2 mg/dL — ABNORMAL HIGH (ref 0.3–1.2)
Total Protein: 4.8 g/dL — ABNORMAL LOW (ref 6.5–8.1)

## 2022-11-27 MED ORDER — LACTULOSE 10 GM/15ML PO SOLN: 30.0000 g | Freq: Every day | ORAL | Status: DC | PRN

## 2022-11-27 MED ORDER — DEXTROSE 50 % IV SOLN
INTRAVENOUS | Status: AC
  Filled 2022-11-27: qty 50

## 2022-11-27 MED ORDER — DEXTROSE-NACL 5-0.9 % IV SOLN: INTRAVENOUS | Status: DC

## 2022-11-27 MED ORDER — SODIUM ZIRCONIUM CYCLOSILICATE 10 G PO PACK
10.0000 g | PACK | Freq: Two times a day (BID) | ORAL | Status: DC
  Administered 2022-11-27 – 2022-11-28 (×3): 10 g via ORAL
  Filled 2022-11-27 (×3): qty 1

## 2022-11-27 MED ORDER — DEXTROSE 50 % IV SOLN
12.5000 g | INTRAVENOUS | Status: AC
  Administered 2022-11-27: 12.5 g via INTRAVENOUS

## 2022-11-27 MED ORDER — ALBUMIN HUMAN 25 % IV SOLN
25.0000 g | Freq: Once | INTRAVENOUS | Status: AC
  Administered 2022-11-27: 25 g via INTRAVENOUS
  Filled 2022-11-27: qty 100

## 2022-11-27 MED ORDER — DEXTROSE 50 % IV SOLN
INTRAVENOUS | Status: AC
  Administered 2022-11-27: 50 mL
  Filled 2022-11-27: qty 50

## 2022-11-27 MED ORDER — SODIUM POLYSTYRENE SULFONATE 15 GM/60ML PO SUSP: 30.0000 g | Freq: Once | ORAL | Status: DC

## 2022-11-27 NOTE — Progress Notes (Signed)
Patient has become more lethargic during this shift. Earlier this morning patient able to take PO medications, able to talk to this RN and able to drink the [facility] packet mixed in water for Potassium of 5.9. Patient refused to eat breakfast, said he could feed himself after being offered to be fed but then wouldn't eat. Around lunch time, patient noted to be increasingly lethargic, only minimally responsive to voice. Blood sugar checked and noted to be 70. Patient able to follow commands and open mouth to take PO medications in applesauce and drink a soda. Currently, patient still minimally responsive to voice. Blood sugar decreased to 59. One amp of D50 given. Dr. Maurene Capes made aware. Fluids changed to D5NS @ 138m/hr.

## 2022-11-27 NOTE — Progress Notes (Signed)
PROGRESS NOTE  [name] [id number] DOB: 12-18-1965 DOA: 11/15/2022 PCP: [facility]  Brief History:  57 year old male with a history of diabetes mellitus type 2, pancytopenia, recurrent DVT/PE on Eliquis, RA, Crohn's colitis status post colectomy with ileostomy 2009 presenting with multiple falls.  The patient is a poor historian. Patient was seen on 05 November 2022 by liver transplant at [facility]  He was noted to have nonalcoholic fatty liver disease along with cirrhosis.  Autoimmune workup was started.  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  Has not had follow-up yet.   Patient been taking Demadex 20 mg a day prescribed by his cardiologist due to lower extremity edema.  Patient has not been started on Aldactone yet. This was mentioned during his hepatology consult. Patient states that he is taking Aldactone but there is no recording of this in his chart.   The patient was recently hospitalized from 09/06/2022 to 09/09/2022 for GI bleed.  EGD  09/07/22 with 1cm hh, Erythematous mucosa in the gastric fundus. Treated w/ (APC).GAVE w/ bleeding. Treated w/ (APC). Erythematous duodenopathy.  The patient was discharged home with pantoprazole twice daily.  In the ED, the patient was noted to be tachycardic in the 110s.  He was hypotensive with systolic blood pressure 83.  The patient was given 3.5 L of fluid.  Lactic acid was 3.7.  He was admitted for further evaluation and treatment of SIRS and altered mentation    Assessment/Plan: 1)Acute Metabolic Encephalopathy---suspect due to underlying bacteremia compounded by Liver cirrhosis  -CT brain negative -Positive bacteremia as mentioned below -Continue current antibiotics and supportive care.. 11/27/22 -Episode of increased lethargy and unresponsiveness, patient was transferred to stepdown unit -Ammonia  66  >> 42 >> 52 -UA with possible UTI----patient has an indwelling Foley s-exchanged   Caude catheter on 11/27/2022 -WBC reassuring -Repeat urine culture requested  2)AKI----acute kidney injury -Creatinine is up to 1.76----suspect dehydration related -renally adjust medications, avoid nephrotoxic agents / dehydration  / hypotension  3)HYperkalemia----potassium 6.7  >>6 >>5.9 -Patient received hyperkalemia cocktail--including calcium gluconate, D50 insulin and Lokelma -Okay to give additional Lokelma  4)Liver cirrhosis secondary to nonalcoholic steatohepatitis (NASH)  -Pt was seen by transplant hepatology at [facility] on 11-05-2022. He has outpatient labs that showed + ANA and + anti-smooth muscle antibody.  -This calls into question whether his cirrhosis is caused by autoimmune hepatitis. -Continue outpatient follow-up with GI service -Continue holding Aldactone and Demadex for now. 11/27/22 -Ammonia  66  >> 42 >> 52 -LFTs trending up    Latest Ref Rng & Units 11/27/2022    4:02 AM 11/26/2022   12:55 PM 11/26/2022    6:30 AM  Hepatic Function  Total Protein 6.5 - 8.1 g/dL 4.8  5.0  4.2   Albumin 3.5 - 5.0 g/dL <1.5  <1.5  <1.5   AST 15 - 41 U/L 132  131  120   ALT 0 - 44 U/L 50  52  44   Alk Phosphatase 38 - 126 U/L 121  122  118   Total Bilirubin 0.3 - 1.2 mg/dL 2.2  1.4  1.1     5)Urinary retention -Foley catheter placed in the ED on admission -CT shows tip of the catheter is in the prostatic urethra---urology contacted,, Foley was repositioned --Patient denying dysuria and suprapubic tenderness at this moment. exchanged  Caude catheter on 11/26/2022 -Discussed with Dr. Alyson Ingles from urology service  recommends leaving Foley catheter in until patient can follow-up with him in the office  6) social/ethics--I called and spoke with sister, [name], regarding code status. [name] lives in [place].  -She is a patient next of kin and presumed decision maker at this time -Patient remains a full code at this time  7)Episode of hypotension--hold  metoprolol -11/27/22 -Hypotension persist -Given IV fluids and IV albumin -Continue midodrine  8) acute on chronic anemia---  11/27/22 Hgb is up to 8.8 from 6.8 after transfusion of 1 unit of PRBC on 11/26/2022  -Patient with underlying liver cirrhosis, no acute bleeding noted--  9) episode of hypoglycemia--- due to poor oral intake -IV dextrose given until oral intake improves  Lactic acidosis/enterococcal and Acinetobacter bacteremia -Suspect this is due to decreased clearance from his hepatic cirrhosis, versus associated with active infection. -Following ID recommendations 2D echo performed; no vegetations appreciated.  Holding on TEE at this moment -Based on cultures sensitivity antibiotics will be narrow to Cipro and Zyvox with plans for 2 weeks of therapy.  Mild hyponatremia -Most likely associated with chronic cirrhosis and dehydration -Improved and pretty much back to baseline after fluid resuscitation provided -Continue to follow electrolytes trend.  Crohn's disease of both small and large intestine with complication ([place]) -Pt with ileostomy.  -Pt denies any high output from ileostomy -Continue to maintain adequate hydration.  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  Bipolar -continue depakote, risperdal, sertraline, benztropine -Overall mood is stable currently. -Following commands appropriately.  Mixed Hyperlipidemia -continue statin -Heart healthy diet discussed with patient.  History DVTs/PE -11/27/22 Hold Eliquis as Hgb and platelets are drifting down  Pancytopenia -chronic. -Continue patient follow-up with hematology service. ---No overt bleeding appreciated. --11/27/22 Hold Eliquis as Hgb and platelets are drifting down Zyvox could be contributing to thrombocytopenia  Class 2 Obese -BMI 37.04 -Low-calorie diet and portion control discussed with  patient.  Frequent falls/failure to thrive/generalized weakness and deconditioning -TSH, B12, folate and ammonia within normal limits -Physical therapy has recommended skilled nursing facility for rehab at discharge --Multifactorial including deconditioning, hypokalemia, possible infectious process. -Following recommendations by infectious disease service will treat for 2 weeks using oral antibiotics (ciprofloxacin and Zyvox). -Follow physical therapy evaluation/recommendations. -Currently with recommendation for a skilled nursing facility at discharge once medically stable.  Family Communication:   -  Unable to reach patient's sister who lives out of state on available phone number.---Left voicemail for sister at [phone] -Discussed with [name] , RN  and Ms [name] (from [facility]) previously  Consultants: Infectious disease/urology  Code Status:  FULL   DVT Prophylaxis:  SCDs--- Eliquis on hold due to drop in Hgb and platelets   Procedures: As Listed in Progress Note Above  Antibiotics: Ciprofloxacin and Zyvox  Subjective: - No bleeding concerns -Episode of lethargy and soft BP persists -Had hypoglycemia required IV dextrose  Objective: Vitals:   11/27/22 1630 11/27/22 1700 11/27/22 1730 11/27/22 1745  BP: (!) 102/35 (!) 81/37 (!) 74/30 (!) 81/30  Pulse: (!) 104 99 (!) 103 99  Resp: '16 14 18 14  '$ Temp:      TempSrc:      SpO2: 99% 100% 100% 100%  Weight:      Height:        Intake/Output Summary (Last 24 hours) at 11/27/2022 1810 Last data filed at 11/27/2022 1807 Gross per 24 hour  Intake 833.76 ml  Output 1150 ml  Net -316.24 ml   Weight change: -3.6 kg  Exam:  Physical Exam  Gen:-Lethargic on and off, cooperative overall  HEENT:- [place].AT, No sclera icterus Neck-Supple Neck,No JVD,.  Lungs-  CTAB , fair air movement bilaterally  CV- S1, S2 normal, RRR Abd-  +ve B.Sounds, Abd Soft, No tenderness, ostomy with mostly stool Extremity/Skin:- +ve   edema,   good pedal pulses  Psych-affect is flat,, oriented x 2, episodes of disorientation and confusion persist Neuro-General weakness no new focal deficits, no tremors GU-Foley in situ--- exchanged  Caude catheter on 11/26/2022   Data Reviewed: I have personally reviewed following labs and imaging studies  Basic Metabolic Panel: Recent Labs  Lab 11/22/22 0508 11/25/22 0419 11/26/22 0630 11/26/22 1255 11/27/22 0402  NA 131* 129* 129* 129* 133*  K 4.6 5.5* 6.7* 6.0* 5.9*  CL 104 102 101 101 106  CO2 21* 23 21* 21* 18*  GLUCOSE 104* 72 95 84 68*  BUN 26* 43* 60* 64* 66*  CREATININE 1.10 1.35* 1.76* 1.88* 1.77*  CALCIUM 7.9* 7.9* 7.9* 8.3* 8.3*    Recent Labs  Lab 11/25/22 0419 11/26/22 0816  AMMONIA 42* 52*    CBC: Recent Labs  Lab 11/22/22 1114 11/25/22 0419 11/26/22 0630 11/26/22 1952 11/27/22 0827  WBC 5.4 4.5 5.4  --  9.0  NEUTROABS  --   --   --   --  6.8  HGB 9.0* 7.3* 6.8* 8.1* 8.8*  HCT 31.3* 23.1* 21.7* 24.8* 27.6*  MCV 114.7* 103.1* 103.3*  --  100.7*  PLT 75* 45* 51*  --  36*   CBG: Recent Labs  Lab 11/27/22 0723 11/27/22 1108 11/27/22 1422 11/27/22 1458 11/27/22 1616  GLUCAP 84 70 59* 121* 110*   Urine analysis:    Component Value Date/Time   COLORURINE AMBER (A) 11/26/2022 0743   APPEARANCEUR CLOUDY (A) 11/26/2022 0743   APPEARANCEUR Cloudy (A) 08/25/2022 1417   LABSPEC 1.013 11/26/2022 0743   PHURINE 5.0 11/26/2022 0743   GLUCOSEU NEGATIVE 11/26/2022 0743   HGBUR LARGE (A) 11/26/2022 0743   BILIRUBINUR NEGATIVE 11/26/2022 0743   BILIRUBINUR Negative 08/25/2022 1417   KETONESUR NEGATIVE 11/26/2022 0743   PROTEINUR 30 (A) 11/26/2022 0743   UROBILINOGEN 0.2 10/17/2014 1940   NITRITE NEGATIVE 11/26/2022 0743   LEUKOCYTESUR MODERATE (A) 11/26/2022 0743   Sepsis Labs:  Recent Results (from the past 240 hour(s))  Culture, blood (Routine X 2) w Reflex to ID Panel     Status: None   Collection Time: 11/17/22  6:30 PM   Specimen:  BLOOD LEFT HAND  Result Value Ref Range Status   Specimen Description BLOOD LEFT HAND  Final   Special Requests   Final    BOTTLES DRAWN AEROBIC ONLY Blood Culture adequate volume   Culture   Final    NO GROWTH 5 DAYS Performed at [facility], [address], [place], [place] [address]    Report Status 11/22/2022 FINAL  Final  MRSA Next Gen by PCR, Nasal     Status: None   Collection Time: 11/26/22  9:49 AM   Specimen: Nasal Mucosa; Nasal Swab  Result Value Ref Range Status   MRSA by PCR Next Gen NOT DETECTED NOT DETECTED Final    Comment: (NOTE) The GeneXpert MRSA Assay (FDA approved for NASAL specimens only), is one component of a comprehensive MRSA colonization surveillance program. It is not intended to diagnose MRSA infection nor to guide or monitor treatment for MRSA infections. Test performance is not FDA  approved in patients less than 96 years old. Performed at [facility], [address] [place], [place] [address]     Scheduled Meds:  sodium chloride   Intravenous Once   atorvastatin  80 mg Oral Daily   benztropine  0.5 mg Oral Daily   Chlorhexidine Gluconate Cloth  6 each Topical Daily   ciprofloxacin  500 mg Oral BID   divalproex  1,000 mg Oral QHS   divalproex  250 mg Oral Daily   hydroxychloroquine  200 mg Oral BID   insulin aspart  0-5 Units Subcutaneous QHS   insulin aspart  0-9 Units Subcutaneous TID WC   linezolid  600 mg Oral Q12H   midodrine  10 mg Oral TID WC   mouth rinse  15 mL Mouth Rinse 4 times per day   pantoprazole  40 mg Oral BID   risperiDONE  1 mg Oral QHS   risperiDONE  6 mg Oral QHS   sertraline  50 mg Oral Daily   sodium zirconium cyclosilicate  10 g Oral BID   traZODone  100 mg Oral QHS    sodium chloride 10 mL/hr at 11/27/22 1550   albumin human     dextrose 5 % and 0.9% NaCl 100 mL/hr at 11/27/22 1550   sodium chloride     Procedures/Studies: DG CHEST PORT 1 VIEW  Result Date: 11/26/2022 CLINICAL DATA:  Respiratory distress  EXAM: PORTABLE CHEST - 1 VIEW COMPARISON:  11/15/2022 FINDINGS: The right IJ port catheter is fractured near the right IJ entry site and should not be used. Slight worsening of pulmonary vascular congestion, interstitial and alveolar opacities throughout the left lung. Heart size and mediastinal contours are within normal limits. Aortic Atherosclerosis (ICD10-170.0). Small right pleural effusion. Right shoulder arthroplasty component partially visualized. IMPRESSION: 1. Worsening left lung edema/infiltrate. 2. Fractured right IJ port catheter, recommend removal to avoid catheter fragment complications. Electronically Signed   By: Lucrezia Europe M.D.   On: 11/26/2022 08:15   ECHOCARDIOGRAM COMPLETE  Result Date: 11/18/2022    ECHOCARDIOGRAM REPORT   Patient Name:   AUDIE SHAKESPEAR Date of Exam: 11/18/2022 Medical Rec #:  [id number]      Height:       66.0 in Accession #:    [id number]     Weight:       228.4 lb Date of Birth:  06-11-1966     BSA:          2.116 m Patient Age:    32 years       BP:           105/56 mmHg Patient Gender: M              HR:           91 bpm. Exam Location:  [facility] Procedure: 2D Echo, Cardiac Doppler and Color Doppler Indications:    Bacteremia  History:        Patient has prior history of Echocardiogram examinations, most                 recent 11/13/2020. Cirrhosis; Risk Factors:Diabetes, Current                 Smoker and Dyslipidemia.  Sonographer:    Johny Chess RDCS Referring Phys: [phone] CARLOS MADERA  Sonographer Comments: Technically difficult study due to poor echo windows. Image acquisition challenging due to respiratory motion. IMPRESSIONS  1. Left ventricular ejection fraction, by estimation, is 60 to 65%. The  left ventricle has normal function. Left ventricular endocardial border not optimally defined to evaluate regional wall motion. Left ventricular diastolic parameters were normal.  2. Right ventricular systolic function is normal. The right ventricular size is  normal. Tricuspid regurgitation signal is inadequate for assessing PA pressure.  3. Left atrial size was mildly dilated.  4. The mitral valve is grossly normal. Trivial mitral valve regurgitation.  5. The aortic valve is tricuspid. Aortic valve regurgitation is not visualized.  6. The inferior vena cava is normal in size with greater than 50% respiratory variability, suggesting right atrial pressure of 3 mmHg.  7. No obvious valvular vegetations noted with significantly limited views. Comparison(s): Prior images reviewed side by side. LVEF remains normal range at 60-65%. FINDINGS  Left Ventricle: Left ventricular ejection fraction, by estimation, is 60 to 65%. The left ventricle has normal function. Left ventricular endocardial border not optimally defined to evaluate regional wall motion. The left ventricular internal cavity size was normal in size. There is no left ventricular hypertrophy. Left ventricular diastolic parameters were normal. Right Ventricle: The right ventricular size is normal. No increase in right ventricular wall thickness. Right ventricular systolic function is normal. Tricuspid regurgitation signal is inadequate for assessing PA pressure. Left Atrium: Left atrial size was mildly dilated. Right Atrium: Right atrial size was normal in size. Pericardium: There is no evidence of pericardial effusion. Presence of epicardial fat layer. Mitral Valve: The mitral valve is grossly normal. Trivial mitral valve regurgitation. Tricuspid Valve: The tricuspid valve is grossly normal. Tricuspid valve regurgitation is trivial. Aortic Valve: The aortic valve is tricuspid. There is mild aortic valve annular calcification. Aortic valve regurgitation is not visualized. Pulmonic Valve: The pulmonic valve was grossly normal. Pulmonic valve regurgitation is trivial. Aorta: The aortic root is normal in size and structure. Venous: The inferior vena cava is normal in size with greater than 50% respiratory variability,  suggesting right atrial pressure of 3 mmHg. IAS/Shunts: No atrial level shunt detected by color flow Doppler.  LEFT VENTRICLE PLAX 2D LVIDd:         4.40 cm   Diastology LVIDs:         2.80 cm   LV e' medial:    9.25 cm/s LV PW:         0.90 cm   LV E/e' medial:  10.5 LV IVS:        0.70 cm   LV e' lateral:   15.30 cm/s LVOT diam:     1.80 cm   LV E/e' lateral: 6.4 LV SV:         54 LV SV Index:   26 LVOT Area:     2.54 cm  RIGHT VENTRICLE             IVC RV S prime:     13.60 cm/s  IVC diam: 1.40 cm TAPSE (M-mode): 4.0 cm LEFT ATRIUM           Index        RIGHT ATRIUM           Index LA diam:      3.00 cm 1.42 cm/m   RA Area:     10.70 cm LA Vol (A4C): 77.8 ml 36.77 ml/m  RA Volume:   23.60 ml  11.15 ml/m  AORTIC VALVE LVOT Vmax:   131.00 cm/s LVOT Vmean:  84.800 cm/s LVOT VTI:    0.214 m  AORTA Ao Root diam: 2.60 cm MITRAL VALVE MV Area (PHT): 3.93 cm  SHUNTS MV Decel Time: 193 msec    Systemic VTI:  0.21 m MV E velocity: 97.40 cm/s  Systemic Diam: 1.80 cm MV A velocity: 99.00 cm/s MV E/A ratio:  0.98 Rozann Lesches MD Electronically signed by Rozann Lesches MD Signature Date/Time: 11/18/2022/2:31:20 PM    Final    CT CHEST ABDOMEN PELVIS W CONTRAST  Result Date: 11/15/2022 CLINICAL DATA:  Sepsis, polytrauma, fall EXAM: CT CHEST, ABDOMEN, AND PELVIS WITH CONTRAST TECHNIQUE: Multidetector CT imaging of the chest, abdomen and pelvis was performed following the standard protocol during bolus administration of intravenous contrast. RADIATION DOSE REDUCTION: This exam was performed according to the departmental dose-optimization program which includes automated exposure control, adjustment of the mA and/or kV according to patient size and/or use of iterative reconstruction technique. CONTRAST:  168m OMNIPAQUE IOHEXOL 300 MG/ML  SOLN COMPARISON:  CT abdomen pelvis, 09/06/2022 CT chest, 04/15/2022 FINDINGS: CT CHEST FINDINGS Cardiovascular: Right chest port catheter. Aortic atherosclerosis. Normal heart  size. Left coronary artery calcifications. No pericardial effusion. Mediastinum/Nodes: No enlarged mediastinal, hilar, or axillary lymph nodes. Small incidental diverticulum of the posterior right trachea at the level of the thoracic inlet (series 2, image 8). Thyroid gland and esophagus demonstrate no significant findings. Lungs/Pleura: Moderate right pleural effusion and associated atelectasis or consolidation. Musculoskeletal: No chest wall abnormality. No acute osseous findings. Status post bilateral shoulder arthroplasty. CT ABDOMEN PELVIS FINDINGS Hepatobiliary: Coarse contour of the liver. No gallstones, gallbladder wall thickening, or biliary dilatation. Pancreas: Unremarkable. No pancreatic ductal dilatation or surrounding inflammatory changes. Spleen: Normal in size without significant abnormality. Adrenals/Urinary Tract: Adrenal glands are unremarkable. Kidneys are normal, without renal calculi, solid lesion, or hydronephrosis. Malpositioned Foley catheter, tip and balloon within the prostatic or bulbous urethra (series 5, image 111) Stomach/Bowel: Stomach is within normal limits. 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 Vascular/Lymphatic: Aortic atherosclerosis. 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 No enlarged abdominal or pelvic lymph nodes. Reproductive: No mass or other abnormality. Other: No abdominal wall hernia or abnormality. Small volume perihepatic ascites. Musculoskeletal: No acute osseous findings. 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 IMPRESSION: 1. No CT evidence of acute traumatic injury to the chest, abdomen, or pelvis. 2. Moderate right pleural effusion and associated atelectasis or consolidation. 3. Malpositioned Foley catheter, tip and balloon within the prostatic or bulbous urethra. Recommend repositioning. 4. 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 5. Small volume perihepatic ascites.  6. Coarse contour of the liver and gastric varices, suggestive of cirrhosis. 7. Coronary artery disease. Aortic Atherosclerosis (ICD10-I70.0). Electronically Signed   By: ADelanna AhmadiM.D.   On: 11/15/2022 14:32   CT Head Wo Contrast  Result Date: 11/15/2022 CLINICAL DATA:  Three falls in the past 24 hours. EXAM: CT HEAD WITHOUT CONTRAST CT CERVICAL SPINE WITHOUT CONTRAST TECHNIQUE: Multidetector CT imaging of the head and cervical spine was performed following the standard protocol without intravenous contrast. Multiplanar CT image reconstructions of the cervical spine were also generated. RADIATION DOSE REDUCTION: This exam was performed according to the departmental dose-optimization program which includes automated exposure control, adjustment of the mA and/or kV according to patient size and/or use of iterative reconstruction technique. COMPARISON:  CT head 05/16/2021 FINDINGS: CT HEAD FINDINGS Brain: There is no acute intracranial hemorrhage, extra-axial fluid collection, or acute infarct. Parenchymal volume is normal. The ventricles are normal in size. Gray-white differentiation is preserved. The pituitary and suprasellar region are normal. There is no mass lesion. There  is no mass effect or midline shift. Vascular: No hyperdense vessel or unexpected calcification. Skull: Normal. Negative for fracture or focal lesion. Sinuses/Orbits: There is mild mucosal thickening in the paranasal sinuses. The globes and orbits are unremarkable. Other: None. CT CERVICAL SPINE FINDINGS Alignment: Normal. There is no jumped or perched facet or other evidence of traumatic malalignment. Skull base and vertebrae: Skull base alignment is maintained. Vertebral body heights are preserved. There is no evidence of acute fracture. There is no suspicious osseous lesion. Soft tissues and spinal canal: No prevertebral fluid or swelling. No visible canal hematoma. Disc levels: Facet arthropathy is most advanced on the right at C3-C4.  There is overall mild disc space narrowing at C4-C5 and C5-C6. There is no convincing high-grade spinal canal stenosis. Upper chest: Assessed on the separately dictated CT chest. Other: None. IMPRESSION: 1. No acute intracranial pathology. 2. No acute fracture or traumatic malalignment of the cervical spine. Electronically Signed   By: Valetta Mole M.D.   On: 11/15/2022 14:24   CT Cervical Spine Wo Contrast  Result Date: 11/15/2022 CLINICAL DATA:  Three falls in the past 24 hours. EXAM: CT HEAD WITHOUT CONTRAST CT CERVICAL SPINE WITHOUT CONTRAST TECHNIQUE: Multidetector CT imaging of the head and cervical spine was performed following the standard protocol without intravenous contrast. Multiplanar CT image reconstructions of the cervical spine were also generated. RADIATION DOSE REDUCTION: This exam was performed according to the departmental dose-optimization program which includes automated exposure control, adjustment of the mA and/or kV according to patient size and/or use of iterative reconstruction technique. COMPARISON:  CT head 05/16/2021 FINDINGS: CT HEAD FINDINGS Brain: There is no acute intracranial hemorrhage, extra-axial fluid collection, or acute infarct. Parenchymal volume is normal. The ventricles are normal in size. Gray-white differentiation is preserved. The pituitary and suprasellar region are normal. There is no mass lesion. There is no mass effect or midline shift. Vascular: No hyperdense vessel or unexpected calcification. Skull: Normal. Negative for fracture or focal lesion. Sinuses/Orbits: There is mild mucosal thickening in the paranasal sinuses. The globes and orbits are unremarkable. Other: None. CT CERVICAL SPINE FINDINGS Alignment: Normal. There is no jumped or perched facet or other evidence of traumatic malalignment. Skull base and vertebrae: Skull base alignment is maintained. Vertebral body heights are preserved. There is no evidence of acute fracture. There is no suspicious  osseous lesion. Soft tissues and spinal canal: No prevertebral fluid or swelling. No visible canal hematoma. Disc levels: Facet arthropathy is most advanced on the right at C3-C4. There is overall mild disc space narrowing at C4-C5 and C5-C6. There is no convincing high-grade spinal canal stenosis. Upper chest: Assessed on the separately dictated CT chest. Other: None. IMPRESSION: 1. No acute intracranial pathology. 2. No acute fracture or traumatic malalignment of the cervical spine. Electronically Signed   By: Valetta Mole M.D.   On: 11/15/2022 14:24   DG Chest Port 1 View  Result Date: 11/15/2022 CLINICAL DATA:  Sepsis, possible UTI EXAM: PORTABLE CHEST 1 VIEW COMPARISON:  CT chest dated 03/16/2022 FINDINGS: Lungs are clear.  No pleural effusion or pneumothorax. The heart is normal in size. Right chest power port terminates at the cavoatrial junction. Bilateral shoulder arthroplasty. IMPRESSION: No evidence of acute cardiopulmonary disease. Electronically Signed   By: Julian Hy M.D.   On: 11/15/2022 13:35    Edit Ricciardelli Denton Brick, MD  [facility]  If 7PM-7AM, please contact night-coverage [email or web address] Password [email or web address] 11/27/2022, 6:10 PM   LOS: 11 days

## 2022-11-27 NOTE — Plan of Care (Signed)
  Problem: Clinical Measurements: Goal: Respiratory complications will improve Outcome: Progressing Goal: Cardiovascular complication will be avoided Outcome: Progressing   Problem: Nutrition: Goal: Adequate nutrition will be maintained Outcome: Progressing   Problem: Elimination: Goal: Will not experience complications related to bowel motility Outcome: Progressing Goal: Will not experience complications related to urinary retention Outcome: Progressing   Problem: Skin Integrity: Goal: Risk for impaired skin integrity will decrease Outcome: Progressing

## 2022-11-28 DIAGNOSIS — E876 Hypokalemia: Secondary | ICD-10-CM | POA: Diagnosis not present

## 2022-11-28 LAB — COMPREHENSIVE METABOLIC PANEL
ALT: 42 U/L (ref 0–44)
AST: 98 U/L — ABNORMAL HIGH (ref 15–41)
Albumin: <1.5 g/dL — ABNORMAL LOW (ref 3.5–5.0)
Alkaline Phosphatase: 94 U/L (ref 38–126)
Anion gap: 5 (ref 5–15)
BUN: 69 mg/dL — ABNORMAL HIGH (ref 6–20)
CO2: 22 mmol/L (ref 22–32)
Calcium: 7.6 mg/dL — ABNORMAL LOW (ref 8.9–10.3)
Chloride: 111 mmol/L (ref 98–111)
Creatinine, Ser: 1.68 mg/dL — ABNORMAL HIGH (ref 0.61–1.24)
GFR, Estimated: 47 mL/min — ABNORMAL LOW (ref >60)
Glucose, Bld: 465 mg/dL — ABNORMAL HIGH (ref 70–99)
Potassium: 4.6 mmol/L (ref 3.5–5.1)
Sodium: 138 mmol/L (ref 135–145)
Total Bilirubin: 1.8 mg/dL — ABNORMAL HIGH (ref 0.3–1.2)
Total Protein: 4.2 g/dL — ABNORMAL LOW (ref 6.5–8.1)

## 2022-11-28 LAB — URINE CULTURE: Culture: NO GROWTH

## 2022-11-28 LAB — CBC
HCT: 21.3 % — ABNORMAL LOW (ref 39.0–52.0)
Hemoglobin: 6.7 g/dL — CL (ref 13.0–17.0)
MCH: 32.4 pg (ref 26.0–34.0)
MCHC: 31.5 g/dL (ref 30.0–36.0)
MCV: 102.9 fL — ABNORMAL HIGH (ref 80.0–100.0)
Platelets: 24 10*3/uL — CL (ref 150–400)
RBC: 2.07 MIL/uL — ABNORMAL LOW (ref 4.22–5.81)
RDW: 18.8 % — ABNORMAL HIGH (ref 11.5–15.5)
WBC: 7.5 10*3/uL (ref 4.0–10.5)
nRBC: 0 % (ref 0.0–0.2)

## 2022-11-28 LAB — URINALYSIS, W/ REFLEX TO CULTURE (INFECTION SUSPECTED)
Bilirubin Urine: NEGATIVE
Glucose, UA: NEGATIVE mg/dL
Ketones, ur: NEGATIVE mg/dL
Nitrite: NEGATIVE
Protein, ur: 30 mg/dL — AB
RBC / HPF: >50 RBC/hpf (ref 0–5)
Specific Gravity, Urine: 1.014 (ref 1.005–1.030)
WBC, UA: >50 WBC/hpf (ref 0–5)
pH: 5 (ref 5.0–8.0)

## 2022-11-28 LAB — GLUCOSE, CAPILLARY
Glucose-Capillary: 90 mg/dL (ref 70–99)
Glucose-Capillary: 96 mg/dL (ref 70–99)
Glucose-Capillary: 113 mg/dL — ABNORMAL HIGH (ref 70–99)
Glucose-Capillary: 93 mg/dL (ref 70–99)
Glucose-Capillary: 105 mg/dL — ABNORMAL HIGH (ref 70–99)

## 2022-11-28 LAB — PREPARE RBC (CROSSMATCH)

## 2022-11-28 MED ORDER — DEXTROSE-NACL 5-0.9 % IV SOLN: INTRAVENOUS | Status: DC

## 2022-11-28 MED ORDER — LACTULOSE 10 GM/15ML PO SOLN
10.0000 g | Freq: Every day | ORAL | Status: DC
  Administered 2022-11-29: 10 g via ORAL
  Filled 2022-11-28: qty 30

## 2022-11-28 MED ORDER — SODIUM CHLORIDE 0.9% IV SOLUTION: Freq: Once | INTRAVENOUS | Status: AC

## 2022-11-28 MED ORDER — RISPERIDONE 1 MG PO TABS
2.0000 mg | ORAL_TABLET | Freq: Every day | ORAL | Status: DC
  Administered 2022-11-28: 2 mg via ORAL
  Filled 2022-11-28: qty 2

## 2022-11-28 NOTE — Progress Notes (Signed)
Patient's niece at bedside speaking with sister on facetime (who is in [place]) and trying to feed patient. Dr. Maurene Capes made aware, came and spoke with niece as well as patient's sister, on facetime, about patient's prognosis and decline. Patient's sister and niece, after discussion with Dr. Denton Brick and some extra time to discuss with each other, they have elected for patient to be DNR. Dr. Denton Brick made aware.

## 2022-11-28 NOTE — Progress Notes (Signed)
PROGRESS NOTE  [name] [id number] DOB: 10-21-1965 DOA: 11/15/2022 PCP: [facility]  Brief History:  57 year old male with a history of diabetes mellitus type 2, pancytopenia, recurrent DVT/PE on Eliquis, RA, Crohn's colitis status post colectomy with ileostomy 2009 presenting with multiple falls.  The patient is a poor historian. Patient was seen on 05 November 2022 by liver transplant at [facility].  He was noted to have nonalcoholic fatty liver disease along with cirrhosis.  Autoimmune workup was started.  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  Has not had follow-up yet.   Patient been taking Demadex 20 mg a day prescribed by his cardiologist due to lower extremity edema.  Patient has not been started on Aldactone yet. This was mentioned during his hepatology consult. Patient states that he is taking Aldactone but there is no recording of this in his chart.   The patient was recently hospitalized from 09/06/2022 to 09/09/2022 for GI bleed.  EGD  09/07/22 with 1cm hh, Erythematous mucosa in the gastric fundus. Treated w/ (APC).GAVE w/ bleeding. Treated w/ (APC). Erythematous duodenopathy.  The patient was discharged home with pantoprazole twice daily.  In the ED, the patient was noted to be tachycardic in the 110s.  He was hypotensive with systolic blood pressure 83.  The patient was given 3.5 L of fluid.  Lactic acid was 3.7.  He was admitted for further evaluation and treatment of SIRS and altered mentation    Assessment/Plan: 1)Acute Metabolic Encephalopathy---suspect due to underlying bacteremia compounded by Liver cirrhosis  -CT brain negative -Initial blood cultures from 11/15/2022 with Acinetobacter and Enterococcus -Continue current antibiotics and supportive care.. 11/28/22 -Episode of increased lethargy and unresponsiveness, patient was transferred to stepdown unit -Ammonia  66  >> 42 >> 52 -WBC reassuring -Repeat urine  culture from 11/26/2022 without growth  2)AKI----acute kidney injury ----suspect dehydration and hypovolemia related -renally adjust medications, avoid nephrotoxic agents / dehydration  / hypotension  3)HYperkalemia----potassium 6.7  >>6 >>5.9>>4.6 -Patient received hyperkalemia cocktail--including calcium gluconate, D50 insulin and Lokelma -Potassium normalized with above measures  4)Liver cirrhosis secondary to nonalcoholic steatohepatitis (NASH)  -Pt was seen by transplant hepatology at [facility] on 11-05-2022. He has outpatient labs that showed + ANA and + anti-smooth muscle antibody.  -This calls into question whether his cirrhosis is caused by autoimmune hepatitis. -Continue outpatient follow-up with GI service -Continue holding Aldactone and Demadex for now. 11/28/22 -Ammonia  66  >> 42 >> 52 -Lactulose as ordered    Latest Ref Rng & Units 11/28/2022    3:23 AM 11/27/2022    4:02 AM 11/26/2022   12:55 PM  Hepatic Function  Total Protein 6.5 - 8.1 g/dL 4.2  4.8  5.0   Albumin 3.5 - 5.0 g/dL <1.5  <1.5  <1.5   AST 15 - 41 U/L 98  132  131   ALT 0 - 44 U/L 42  50  52   Alk Phosphatase 38 - 126 U/L 94  121  122   Total Bilirubin 0.3 - 1.2 mg/dL 1.8  2.2  1.4     5)Urinary retention -Foley catheter placed in the ED on admission -CT shows tip of the catheter is in the prostatic urethra---urology contacted,, Foley was repositioned --Patient denying dysuria and suprapubic tenderness at this moment. exchanged  Caude catheter on 11/26/2022 -Discussed with Dr. Alyson Ingles from urology service recommends leaving Foley catheter in until patient can follow-up  with him in the office -Repeat urine culture from 11/26/2022 without growth  6) social/ethics-- sister   [name] lives in [place].  -She is a patient next of kin and presumed decision maker at this time 11/28/22 -Conference with Pt's sister Rosine Door on speaker phone and Niece Gildardo Pounds at bedside here --Family has decided on  DNR/DNI -Continue to treat the treatable -RN Sherry Ruffing verified family's decision  7)Episode of hypotension--hold metoprolol -11/28/22 -Hypotension persist -Continue midodrine -Continue to give IV albumin and transfuse PRBC as indicated  8) acute on chronic anemia---  11/28/22 -Hgb back down to 6.8 transfuse another unit of PRBC on 11/28/2022 for total of 2 units of PRBC this admission -Patient with underlying liver cirrhosis, no acute bleeding noted--  9)Episode of hypoglycemia--- due to poor oral intake -IV dextrose given until oral intake improves  Lactic acidosis/enterococcal and Acinetobacter bacteremia -Suspect this is due to decreased clearance from his hepatic cirrhosis, versus associated with active infection. -Following ID recommendations 2D echo performed; no vegetations appreciated.  Holding on TEE at this moment -Based on cultures sensitivity antibiotics will be narrow to Cipro and Zyvox with plans for 2 weeks of therapy. 11/28/22 -Stopped Zyvox due to worsening thrombocytopenia -Continue Cipro for Acinetobacter bacteremia  (see blood cultures from 11/15/2022 with Acinetobacter and Enterococcus) --Zyvox discontinued on 11/28/2022 (it was  for Enterococcus bacteremia) -Infectious disease had initially recommended 2 weeks of oral antibiotics  Mild hyponatremia -Most likely associated with chronic cirrhosis and dehydration -Improved and pretty much back to baseline after fluid resuscitation provided -Continue to follow electrolytes trend.  Crohn's disease of both small and large intestine with complication ([place]) -Pt with ileostomy.  -Pt denies any high output from ileostomy -Continue to maintain adequate hydration.  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  Bipolar -continue depakote, risperdal, sertraline, benztropine -Overall mood is stable currently. -Following commands  appropriately.  Mixed Hyperlipidemia -continue statin -Heart healthy diet discussed with patient.  History DVTs/PE -11/28/22 Hold Eliquis as Hgb and platelets are drifting down  Pancytopenia -chronic. -Continue patient follow-up with hematology service. ---No overt bleeding appreciated. --11/28/22 Hold Eliquis as Hgb and platelets are drifting down Zyvox could be contributing to thrombocytopenia  Class 2 Obese -BMI 37.04 -Low-calorie diet and portion control discussed with patient.  PortaCath abnormality--chest imaging studies suggest fractured right IJ port catheter, recommend removal to avoid catheter fragment complications -Discussed risk Versus benefits of Port-A-Cath removal with patient's sister and niece -At this time they would rather leave the Port-A-Cath in place  Frequent falls/failure to thrive/generalized weakness and deconditioning -TSH, B12, folate and ammonia within normal limits -Physical therapy has recommended skilled nursing facility for rehab at discharge --Multifactorial including deconditioning, hypokalemia, possible infectious process. -Follow physical therapy evaluation/recommendations. -Currently with recommendation for a skilled nursing facility at discharge once medically stable.  Family Communication:   -  Conference with Pt's sister Rosine Door on speaker phone and Niece Gildardo Pounds at bedside here --Previously-Discussed with [name] , RN  and Ms [name] (from [place]) previously  Consultants: Infectious disease/urology  Code Status:  FULL   DVT Prophylaxis:  SCDs--- Eliquis on hold due to drop in Hgb and platelets   Procedures: As Listed in Progress Note Above  Antibiotics: Ciprofloxacin and Zyvox  Subjective: - No bleeding concerns -Niece at bedside -Oral intake remains poor -Hgb and platelets drifting down -BP remains soft  Objective: Vitals:   11/28/22 1500  11/28/22 1600 11/28/22 1641 11/28/22 1700  BP: (!)  88/33 (!) 93/37  (!) 99/39  Pulse: 99 97  (!) 102  Resp: '15 12  14  '$ Temp:   97.7 F (36.5 C)   TempSrc:   Oral   SpO2: 93% 96%  94%  Weight:      Height:        Intake/Output Summary (Last 24 hours) at 11/28/2022 1741 Last data filed at 11/28/2022 1714 Gross per 24 hour  Intake 2588.95 ml  Output 1850 ml  Net 738.95 ml   Weight change: 0.7 kg  Exam:  Physical Exam  Gen:-Lethargic on and off, cooperative overall  HEENT:- [place].AT, No sclera icterus Neck-Supple Neck,No JVD,.  Lungs-  CTAB , fair air movement bilaterally  CV- S1, S2 normal, RRR, Port-A-Cath in situ Abd-  +ve B.Sounds, Abd Soft, No tenderness, ostomy with mostly stool Extremity/Skin:- +ve  edema,   good pedal pulses , bruises/ecchymosis noted Psych-affect is flat,, oriented x 2, episodes of disorientation and confusion persist Neuro-General weakness no new focal deficits, no tremors GU-Foley in situ--- exchanged  Caude catheter on 11/26/2022   Data Reviewed: I have personally reviewed following labs and imaging studies  Basic Metabolic Panel: Recent Labs  Lab 11/25/22 0419 11/26/22 0630 11/26/22 1255 11/27/22 0402 11/28/22 0323  NA 129* 129* 129* 133* 138  K 5.5* 6.7* 6.0* 5.9* 4.6  CL 102 101 101 106 111  CO2 23 21* 21* 18* 22  GLUCOSE 72 95 84 68* 465*  BUN 43* 60* 64* 66* 69*  CREATININE 1.35* 1.76* 1.88* 1.77* 1.68*  CALCIUM 7.9* 7.9* 8.3* 8.3* 7.6*    Recent Labs  Lab 11/25/22 0419 11/26/22 0816  AMMONIA 42* 52*    CBC: Recent Labs  Lab 11/22/22 1114 11/25/22 0419 11/26/22 0630 11/26/22 1952 11/27/22 0827 11/28/22 0323  WBC 5.4 4.5 5.4  --  9.0 7.5  NEUTROABS  --   --   --   --  6.8  --   HGB 9.0* 7.3* 6.8* 8.1* 8.8* 6.7*  HCT 31.3* 23.1* 21.7* 24.8* 27.6* 21.3*  MCV 114.7* 103.1* 103.3*  --  100.7* 102.9*  PLT 75* 45* 51*  --  36* 24*   CBG: Recent Labs  Lab 11/27/22 2009 11/28/22 0338 11/28/22 0710 11/28/22 1112 11/28/22 1641  GLUCAP 105* 90 96 113* 93   Urine  analysis:    Component Value Date/Time   COLORURINE YELLOW 11/27/2022 1810   APPEARANCEUR TURBID (A) 11/27/2022 1810   APPEARANCEUR Cloudy (A) 08/25/2022 1417   LABSPEC 1.014 11/27/2022 1810   PHURINE 5.0 11/27/2022 1810   GLUCOSEU NEGATIVE 11/27/2022 1810   HGBUR LARGE (A) 11/27/2022 1810   BILIRUBINUR NEGATIVE 11/27/2022 1810   BILIRUBINUR Negative 08/25/2022 1417   KETONESUR NEGATIVE 11/27/2022 1810   PROTEINUR 30 (A) 11/27/2022 1810   UROBILINOGEN 0.2 10/17/2014 1940   NITRITE NEGATIVE 11/27/2022 1810   LEUKOCYTESUR SMALL (A) 11/27/2022 1810   Sepsis Labs:  Recent Results (from the past 240 hour(s))  MRSA Next Gen by PCR, Nasal     Status: None   Collection Time: 11/26/22  9:49 AM   Specimen: Nasal Mucosa; Nasal Swab  Result Value Ref Range Status   MRSA by PCR Next Gen NOT DETECTED NOT DETECTED Final    Comment: (NOTE) The GeneXpert MRSA Assay (FDA approved for NASAL specimens only), is one component of a comprehensive MRSA colonization surveillance program. It is not intended to diagnose MRSA infection nor to guide or monitor treatment  for MRSA infections. Test performance is not FDA approved in patients less than 42 years old. Performed at [facility], [address], [place], [place] [address]   Remove and replace urinary cath (placed > 5 days) then obtain urine culture from new indwelling urinary catheter.     Status: None   Collection Time: 11/26/22  6:07 PM   Specimen: Urine, Catheterized  Result Value Ref Range Status   Specimen Description   Final    URINE, CATHETERIZED Performed at [facility], [address], [place], [place] [address]    Special Requests   Final    NONE Performed at [facility], [address], [place], [place] [address]    Culture   Final    NO GROWTH Performed at [facility], [place] [address], [place], [place] [address]    Report Status 11/28/2022 FINAL  Final    Scheduled Meds:  sodium chloride   Intravenous Once    benztropine  0.5 mg Oral Daily   Chlorhexidine Gluconate Cloth  6 each Topical Daily   ciprofloxacin  500 mg Oral BID   divalproex  1,000 mg Oral QHS   divalproex  250 mg Oral Daily   hydroxychloroquine  200 mg Oral BID   insulin aspart  0-5 Units Subcutaneous QHS   insulin aspart  0-9 Units Subcutaneous TID WC   lactulose  10 g Oral Daily   midodrine  10 mg Oral TID WC   mouth rinse  15 mL Mouth Rinse 4 times per day   pantoprazole  40 mg Oral BID   risperiDONE  1 mg Oral QHS   risperiDONE  2 mg Oral QHS   sertraline  50 mg Oral Daily    sodium chloride 10 mL/hr at 11/28/22 1029   dextrose 5 % and 0.9% NaCl 50 mL/hr at 11/28/22 1029   Procedures/Studies: DG CHEST PORT 1 VIEW  Result Date: 11/26/2022 CLINICAL DATA:  Respiratory distress EXAM: PORTABLE CHEST - 1 VIEW COMPARISON:  11/15/2022 FINDINGS: The right IJ port catheter is fractured near the right IJ entry site and should not be used. Slight worsening of pulmonary vascular congestion, interstitial and alveolar opacities throughout the left lung. Heart size and mediastinal contours are within normal limits. Aortic Atherosclerosis (ICD10-170.0). Small right pleural effusion. Right shoulder arthroplasty component partially visualized. IMPRESSION: 1. Worsening left lung edema/infiltrate. 2. Fractured right IJ port catheter, recommend removal to avoid catheter fragment complications. Electronically Signed   By: Lucrezia Europe M.D.   On: 11/26/2022 08:15   ECHOCARDIOGRAM COMPLETE  Result Date: 11/18/2022    ECHOCARDIOGRAM REPORT   Patient Name:   [name] Date of Exam: 11/18/2022 Medical Rec #:  [id number]      Height:       66.0 in Accession #:    [id number]     Weight:       228.4 lb Date of Birth:  [birth year]     BSA:          2.116 m Patient Age:    89 years       BP:           105/56 mmHg Patient Gender: M              HR:           91 bpm. Exam Location:  [facility] Procedure: 2D Echo, Cardiac Doppler and Color Doppler Indications:     Bacteremia  History:  Patient has prior history of Echocardiogram examinations, most                 recent 11/13/2020. Cirrhosis; Risk Factors:Diabetes, Current                 Smoker and Dyslipidemia.  Sonographer:    Johny Chess RDCS Referring Phys: [phone] CARLOS MADERA  Sonographer Comments: Technically difficult study due to poor echo windows. Image acquisition challenging due to respiratory motion. IMPRESSIONS  1. Left ventricular ejection fraction, by estimation, is 60 to 65%. The left ventricle has normal function. Left ventricular endocardial border not optimally defined to evaluate regional wall motion. Left ventricular diastolic parameters were normal.  2. Right ventricular systolic function is normal. The right ventricular size is normal. Tricuspid regurgitation signal is inadequate for assessing PA pressure.  3. Left atrial size was mildly dilated.  4. The mitral valve is grossly normal. Trivial mitral valve regurgitation.  5. The aortic valve is tricuspid. Aortic valve regurgitation is not visualized.  6. The inferior vena cava is normal in size with greater than 50% respiratory variability, suggesting right atrial pressure of 3 mmHg.  7. No obvious valvular vegetations noted with significantly limited views. Comparison(s): Prior images reviewed side by side. LVEF remains normal range at 60-65%. FINDINGS  Left Ventricle: Left ventricular ejection fraction, by estimation, is 60 to 65%. The left ventricle has normal function. Left ventricular endocardial border not optimally defined to evaluate regional wall motion. The left ventricular internal cavity size was normal in size. There is no left ventricular hypertrophy. Left ventricular diastolic parameters were normal. Right Ventricle: The right ventricular size is normal. No increase in right ventricular wall thickness. Right ventricular systolic function is normal. Tricuspid regurgitation signal is inadequate for assessing PA pressure. Left  Atrium: Left atrial size was mildly dilated. Right Atrium: Right atrial size was normal in size. Pericardium: There is no evidence of pericardial effusion. Presence of epicardial fat layer. Mitral Valve: The mitral valve is grossly normal. Trivial mitral valve regurgitation. Tricuspid Valve: The tricuspid valve is grossly normal. Tricuspid valve regurgitation is trivial. Aortic Valve: The aortic valve is tricuspid. There is mild aortic valve annular calcification. Aortic valve regurgitation is not visualized. Pulmonic Valve: The pulmonic valve was grossly normal. Pulmonic valve regurgitation is trivial. Aorta: The aortic root is normal in size and structure. Venous: The inferior vena cava is normal in size with greater than 50% respiratory variability, suggesting right atrial pressure of 3 mmHg. IAS/Shunts: No atrial level shunt detected by color flow Doppler.  LEFT VENTRICLE PLAX 2D LVIDd:         4.40 cm   Diastology LVIDs:         2.80 cm   LV e' medial:    9.25 cm/s LV PW:         0.90 cm   LV E/e' medial:  10.5 LV IVS:        0.70 cm   LV e' lateral:   15.30 cm/s LVOT diam:     1.80 cm   LV E/e' lateral: 6.4 LV SV:         54 LV SV Index:   26 LVOT Area:     2.54 cm  RIGHT VENTRICLE             IVC RV S prime:     13.60 cm/s  IVC diam: 1.40 cm TAPSE (M-mode): 4.0 cm LEFT ATRIUM           Index  RIGHT ATRIUM           Index LA diam:      3.00 cm 1.42 cm/m   RA Area:     10.70 cm LA Vol (A4C): 77.8 ml 36.77 ml/m  RA Volume:   23.60 ml  11.15 ml/m  AORTIC VALVE LVOT Vmax:   131.00 cm/s LVOT Vmean:  84.800 cm/s LVOT VTI:    0.214 m  AORTA Ao Root diam: 2.60 cm MITRAL VALVE MV Area (PHT): 3.93 cm    SHUNTS MV Decel Time: 193 msec    Systemic VTI:  0.21 m MV E velocity: 97.40 cm/s  Systemic Diam: 1.80 cm MV A velocity: 99.00 cm/s MV E/A ratio:  0.98 Rozann Lesches MD Electronically signed by Rozann Lesches MD Signature Date/Time: 11/18/2022/2:31:20 PM    Final    CT CHEST ABDOMEN PELVIS W  CONTRAST  Result Date: 11/15/2022 CLINICAL DATA:  Sepsis, polytrauma, fall EXAM: CT CHEST, ABDOMEN, AND PELVIS WITH CONTRAST TECHNIQUE: Multidetector CT imaging of the chest, abdomen and pelvis was performed following the standard protocol during bolus administration of intravenous contrast. RADIATION DOSE REDUCTION: This exam was performed according to the departmental dose-optimization program which includes automated exposure control, adjustment of the mA and/or kV according to patient size and/or use of iterative reconstruction technique. CONTRAST:  180m OMNIPAQUE IOHEXOL 300 MG/ML  SOLN COMPARISON:  CT abdomen pelvis, 09/06/2022 CT chest, 04/15/2022 FINDINGS: CT CHEST FINDINGS Cardiovascular: Right chest port catheter. Aortic atherosclerosis. Normal heart size. Left coronary artery calcifications. No pericardial effusion. Mediastinum/Nodes: No enlarged mediastinal, hilar, or axillary lymph nodes. Small incidental diverticulum of the posterior right trachea at the level of the thoracic inlet (series 2, image 8). Thyroid gland and esophagus demonstrate no significant findings. Lungs/Pleura: Moderate right pleural effusion and associated atelectasis or consolidation. Musculoskeletal: No chest wall abnormality. No acute osseous findings. Status post bilateral shoulder arthroplasty. CT ABDOMEN PELVIS FINDINGS Hepatobiliary: Coarse contour of the liver. No gallstones, gallbladder wall thickening, or biliary dilatation. Pancreas: Unremarkable. No pancreatic ductal dilatation or surrounding inflammatory changes. Spleen: Normal in size without significant abnormality. Adrenals/Urinary Tract: Adrenal glands are unremarkable. Kidneys are normal, without renal calculi, solid lesion, or hydronephrosis. Malpositioned Foley catheter, tip and balloon within the prostatic or bulbous urethra (series 5, image 111) Stomach/Bowel: Stomach is within normal limits. Status post total colectomy with right lower quadrant end  ileostomy. Vascular/Lymphatic: Aortic atherosclerosis. 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 No enlarged abdominal or pelvic lymph nodes. Reproductive: No mass or other abnormality. Other: No abdominal wall hernia or abnormality. Small volume perihepatic ascites. Musculoskeletal: No acute osseous findings. 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 IMPRESSION: 1. No CT evidence of acute traumatic injury to the chest, abdomen, or pelvis. 2. Moderate right pleural effusion and associated atelectasis or consolidation. 3. Malpositioned Foley catheter, tip and balloon within the prostatic or bulbous urethra. Recommend repositioning. 4. 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 5. Small volume perihepatic ascites. 6. Coarse contour of the liver and gastric varices, suggestive of cirrhosis. 7. Coronary artery disease. Aortic Atherosclerosis (ICD10-I70.0). Electronically Signed   By: ADelanna AhmadiM.D.   On: 11/15/2022 14:32   CT Head Wo Contrast  Result Date: 11/15/2022 CLINICAL DATA:  Three falls in the past 24 hours. EXAM: CT HEAD WITHOUT CONTRAST CT CERVICAL SPINE WITHOUT CONTRAST TECHNIQUE: Multidetector CT imaging of the head and cervical spine was performed following the standard protocol without intravenous contrast. Multiplanar CT image reconstructions of  the cervical spine were also generated. RADIATION DOSE REDUCTION: This exam was performed according to the departmental dose-optimization program which includes automated exposure control, adjustment of the mA and/or kV according to patient size and/or use of iterative reconstruction technique. COMPARISON:  CT head 05/16/2021 FINDINGS: CT HEAD FINDINGS Brain: There is no acute intracranial hemorrhage, extra-axial fluid collection, or acute infarct. Parenchymal volume is normal. The ventricles are normal in size. Gray-white differentiation is preserved. The pituitary and suprasellar region are  normal. There is no mass lesion. There is no mass effect or midline shift. Vascular: No hyperdense vessel or unexpected calcification. Skull: Normal. Negative for fracture or focal lesion. Sinuses/Orbits: There is mild mucosal thickening in the paranasal sinuses. The globes and orbits are unremarkable. Other: None. CT CERVICAL SPINE FINDINGS Alignment: Normal. There is no jumped or perched facet or other evidence of traumatic malalignment. Skull base and vertebrae: Skull base alignment is maintained. Vertebral body heights are preserved. There is no evidence of acute fracture. There is no suspicious osseous lesion. Soft tissues and spinal canal: No prevertebral fluid or swelling. No visible canal hematoma. Disc levels: Facet arthropathy is most advanced on the right at C3-C4. There is overall mild disc space narrowing at C4-C5 and C5-C6. There is no convincing high-grade spinal canal stenosis. Upper chest: Assessed on the separately dictated CT chest. Other: None. IMPRESSION: 1. No acute intracranial pathology. 2. No acute fracture or traumatic malalignment of the cervical spine. Electronically Signed   By: Valetta Mole M.D.   On: 11/15/2022 14:24   CT Cervical Spine Wo Contrast  Result Date: 11/15/2022 CLINICAL DATA:  Three falls in the past 24 hours. EXAM: CT HEAD WITHOUT CONTRAST CT CERVICAL SPINE WITHOUT CONTRAST TECHNIQUE: Multidetector CT imaging of the head and cervical spine was performed following the standard protocol without intravenous contrast. Multiplanar CT image reconstructions of the cervical spine were also generated. RADIATION DOSE REDUCTION: This exam was performed according to the departmental dose-optimization program which includes automated exposure control, adjustment of the mA and/or kV according to patient size and/or use of iterative reconstruction technique. COMPARISON:  CT head 05/16/2021 FINDINGS: CT HEAD FINDINGS Brain: There is no acute intracranial hemorrhage, extra-axial fluid  collection, or acute infarct. Parenchymal volume is normal. The ventricles are normal in size. Gray-white differentiation is preserved. The pituitary and suprasellar region are normal. There is no mass lesion. There is no mass effect or midline shift. Vascular: No hyperdense vessel or unexpected calcification. Skull: Normal. Negative for fracture or focal lesion. Sinuses/Orbits: There is mild mucosal thickening in the paranasal sinuses. The globes and orbits are unremarkable. Other: None. CT CERVICAL SPINE FINDINGS Alignment: Normal. There is no jumped or perched facet or other evidence of traumatic malalignment. Skull base and vertebrae: Skull base alignment is maintained. Vertebral body heights are preserved. There is no evidence of acute fracture. There is no suspicious osseous lesion. Soft tissues and spinal canal: No prevertebral fluid or swelling. No visible canal hematoma. Disc levels: Facet arthropathy is most advanced on the right at C3-C4. There is overall mild disc space narrowing at C4-C5 and C5-C6. There is no convincing high-grade spinal canal stenosis. Upper chest: Assessed on the separately dictated CT chest. Other: None. IMPRESSION: 1. No acute intracranial pathology. 2. No acute fracture or traumatic malalignment of the cervical spine. Electronically Signed   By: Valetta Mole M.D.   On: 11/15/2022 14:24   DG Chest Port 1 View  Result Date: 11/15/2022 CLINICAL DATA:  Sepsis, possible UTI EXAM: PORTABLE  CHEST 1 VIEW COMPARISON:  CT chest dated 03/16/2022 FINDINGS: Lungs are clear.  No pleural effusion or pneumothorax. The heart is normal in size. Right chest power port terminates at the cavoatrial junction. Bilateral shoulder arthroplasty. IMPRESSION: No evidence of acute cardiopulmonary disease. Electronically Signed   By: Julian Hy M.D.   On: 11/15/2022 13:35    Avnoor Koury Denton Brick, MD  [facility]  If 7PM-7AM, please contact night-coverage [email or web address] Password  [facility] 11/28/2022, 5:41 PM   LOS: 12 days

## 2022-11-28 NOTE — Progress Notes (Signed)
Conference with Pt's sister Rosine Door on speaker phone and Niece Gildardo Pounds at bedside here - -Family has decided on DNR/DNI -Continue to treat the treatable -RN Sherry Ruffing verified family's decision  Roxan Hockey, MD

## 2022-11-28 NOTE — Progress Notes (Signed)
PT Cancellation Note  Patient Details Name: [name] MRN: [id number] DOB: [birth year]   Cancelled Treatment:    Reason Eval/Treat Not Completed: Medical issues which prohibited therapy. Patient transferred to a higher level of care and will need new PT consult to resume therapy when patient is medically stable.  Thank you.    10:17 AM, 11/28/22 [name], MPT Physical Therapist with [facility] [phone] office [phone] mobile phone

## 2022-11-29 ENCOUNTER — Inpatient Hospital Stay: Payer: Self-pay

## 2022-11-29 DIAGNOSIS — E876 Hypokalemia: Secondary | ICD-10-CM | POA: Diagnosis not present

## 2022-11-29 LAB — TYPE AND SCREEN
ABO/RH(D): A POS
Antibody Screen: NEGATIVE
Unit division: 0
Unit division: 0

## 2022-11-29 LAB — GLUCOSE, CAPILLARY
Glucose-Capillary: 81 mg/dL (ref 70–99)
Glucose-Capillary: 84 mg/dL (ref 70–99)
Glucose-Capillary: 83 mg/dL (ref 70–99)
Glucose-Capillary: 89 mg/dL (ref 70–99)

## 2022-11-29 LAB — BPAM RBC
Blood Product Expiration Date: 202403132359
Blood Product Expiration Date: 202403222359
ISSUE DATE / TIME: 202402231000
ISSUE DATE / TIME: 202402250733
Unit Type and Rh: 600
Unit Type and Rh: 6200

## 2022-11-29 LAB — COMPREHENSIVE METABOLIC PANEL
ALT: 51 U/L — ABNORMAL HIGH (ref 0–44)
AST: 131 U/L — ABNORMAL HIGH (ref 15–41)
Albumin: <1.5 g/dL — ABNORMAL LOW (ref 3.5–5.0)
Alkaline Phosphatase: 111 U/L (ref 38–126)
Anion gap: 9 (ref 5–15)
BUN: 85 mg/dL — ABNORMAL HIGH (ref 6–20)
CO2: 18 mmol/L — ABNORMAL LOW (ref 22–32)
Calcium: 7.9 mg/dL — ABNORMAL LOW (ref 8.9–10.3)
Chloride: 111 mmol/L (ref 98–111)
Creatinine, Ser: 1.92 mg/dL — ABNORMAL HIGH (ref 0.61–1.24)
GFR, Estimated: 40 mL/min — ABNORMAL LOW (ref >60)
Glucose, Bld: 91 mg/dL (ref 70–99)
Potassium: 5.6 mmol/L — ABNORMAL HIGH (ref 3.5–5.1)
Sodium: 138 mmol/L (ref 135–145)
Total Bilirubin: 2 mg/dL — ABNORMAL HIGH (ref 0.3–1.2)
Total Protein: 4.6 g/dL — ABNORMAL LOW (ref 6.5–8.1)

## 2022-11-29 LAB — CBC
HCT: 27.4 % — ABNORMAL LOW (ref 39.0–52.0)
Hemoglobin: 8.6 g/dL — ABNORMAL LOW (ref 13.0–17.0)
MCH: 32 pg (ref 26.0–34.0)
MCHC: 31.4 g/dL (ref 30.0–36.0)
MCV: 101.9 fL — ABNORMAL HIGH (ref 80.0–100.0)
Platelets: 17 10*3/uL — CL (ref 150–400)
RBC: 2.69 MIL/uL — ABNORMAL LOW (ref 4.22–5.81)
RDW: 18.9 % — ABNORMAL HIGH (ref 11.5–15.5)
WBC: 10.6 10*3/uL — ABNORMAL HIGH (ref 4.0–10.5)
nRBC: 0 % (ref 0.0–0.2)

## 2022-11-29 MED ORDER — GLYCOPYRROLATE 0.2 MG/ML IJ SOLN: 0.2000 mg | INTRAMUSCULAR | Status: DC | PRN

## 2022-11-29 MED ORDER — LORAZEPAM 2 MG/ML IJ SOLN: 1.0000 mg | INTRAMUSCULAR | Status: DC | PRN

## 2022-11-29 MED ORDER — SODIUM CHLORIDE 0.9% IV SOLUTION: Freq: Once | INTRAVENOUS | Status: DC

## 2022-11-29 MED ORDER — BIOTENE DRY MOUTH MT LIQD: 15.0000 mL | OROMUCOSAL | Status: DC | PRN

## 2022-11-29 MED ORDER — POLYVINYL ALCOHOL 1.4 % OP SOLN: 1.0000 [drp] | Freq: Four times a day (QID) | OPHTHALMIC | Status: DC | PRN

## 2022-11-29 MED ORDER — SODIUM CHLORIDE 0.9% FLUSH
3.0000 mL | Freq: Two times a day (BID) | INTRAVENOUS | Status: DC
  Administered 2022-11-29 – 2022-11-30 (×2): 3 mL via INTRAVENOUS

## 2022-11-29 MED ORDER — MORPHINE SULFATE (CONCENTRATE) 10 MG/0.5ML PO SOLN
5.0000 mg | ORAL | Status: DC | PRN
  Filled 2022-11-29: qty 0.5

## 2022-11-29 MED ORDER — LORAZEPAM 1 MG PO TABS: 1.0000 mg | ORAL_TABLET | ORAL | Status: DC | PRN

## 2022-11-29 MED ORDER — SODIUM CHLORIDE 0.9% FLUSH: 3.0000 mL | INTRAVENOUS | Status: DC | PRN

## 2022-11-29 MED ORDER — GLYCOPYRROLATE 1 MG PO TABS: 1.0000 mg | ORAL_TABLET | ORAL | Status: DC | PRN

## 2022-11-29 MED ORDER — BISACODYL 10 MG RE SUPP: 10.0000 mg | Freq: Every day | RECTAL | Status: DC | PRN

## 2022-11-29 MED ORDER — SODIUM CHLORIDE 0.9 % IV SOLN: 250.0000 mL | INTRAVENOUS | Status: DC | PRN

## 2022-11-29 MED ORDER — DIPHENHYDRAMINE HCL 50 MG/ML IJ SOLN: 12.5000 mg | INTRAMUSCULAR | Status: DC | PRN

## 2022-11-29 MED ORDER — LORAZEPAM 2 MG/ML PO CONC: 1.0000 mg | ORAL | Status: DC | PRN

## 2022-11-29 MED ORDER — ALBUMIN HUMAN 25 % IV SOLN
25.0000 g | Freq: Three times a day (TID) | INTRAVENOUS | Status: DC
  Administered 2022-11-29: 25 g via INTRAVENOUS
  Filled 2022-11-29: qty 100

## 2022-11-29 MED ORDER — MORPHINE SULFATE (CONCENTRATE) 10 MG/0.5ML PO SOLN
5.0000 mg | ORAL | Status: DC | PRN
  Administered 2022-11-29 – 2022-11-30 (×2): 5 mg via ORAL
  Filled 2022-11-29: qty 0.5

## 2022-11-29 NOTE — Progress Notes (Signed)
Patient has had soft blood pressures, patient alert with no signs or symptoms of distress. Dr Joesph Fillers has been made aware of blood pressures. Was finally able to get lab draw to which we have been having difficulty getting blood sticks, and was able to get CMP and CBC but NOT the PT. Dr Joesph Fillers made aware. Lab potassium level came back 5.6. Dr Joesph Fillers made aware.

## 2022-11-29 NOTE — Progress Notes (Signed)
Date and time results received: 11/29/22 1220 (use smartphrase ".now" to insert current time)  Test: Platelets  Critical Value: 17  Name of Provider Notified: Dr Joesph Fillers  Orders Received? Or Actions Taken?:  No new orders currently

## 2022-11-29 NOTE — Progress Notes (Signed)
PROGRESS NOTE  [name] [id number] DOB: 03-Jul-1966 DOA: 11/15/2022 PCP: [facility]  Brief History:  57 year old male with a history of diabetes mellitus type 2, pancytopenia, recurrent DVT/PE on Eliquis, RA, Crohn's colitis status post colectomy with ileostomy 2009 presenting with multiple falls.  The patient is a poor historian. Patient was seen on 05 November 2022 by liver transplant at [facility].  He was noted to have nonalcoholic fatty liver disease along with cirrhosis.  Autoimmune workup was started.  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  Has not had follow-up yet.   Patient been taking Demadex 20 mg a day prescribed by his cardiologist due to lower extremity edema.  Patient has not been started on Aldactone yet. This was mentioned during his hepatology consult. Patient states that he is taking Aldactone but there is no recording of this in his chart.   The patient was recently hospitalized from 09/06/2022 to 09/09/2022 for GI bleed.  EGD  09/07/22 with 1cm hh, Erythematous mucosa in the gastric fundus. Treated w/ (APC).GAVE w/ bleeding. Treated w/ (APC). Erythematous duodenopathy.  The patient was discharged home with pantoprazole twice daily.  In the ED, the patient was noted to be tachycardic in the 110s.  He was hypotensive with systolic blood pressure 83.  The patient was given 3.5 L of fluid.  Lactic acid was 3.7.  He was admitted for further evaluation and treatment of SIRS and altered mentation    Assessment/Plan: 1)Acute Metabolic Encephalopathy---suspect due to underlying bacteremia compounded by Liver cirrhosis  -CT brain negative -Initial blood cultures from 11/15/2022 with Acinetobacter and Enterococcus -Continue current antibiotics and supportive care.. 11/29/22 -Ammonia  66  >> 42 >> 52 -Repeat urine culture from 11/26/2022 without growth -Phone Conference with next of [place]  -- She declines  PICC line placement --She declines platelet transfusion -She is requesting transition to full comfort care   2)AKI----acute kidney injury ----suspect dehydration and hypovolemia related 11/29/22 -Transition to comfort care measures as above #1  3)HYperkalemia----potassium 6.7  >>6 >>5.9>>4.6>.6 -Patient received hyperkalemia cocktail--including calcium gluconate, D50 insulin and Lokelma -Transition to comfort care measures as above #1  4)Liver cirrhosis secondary to nonalcoholic steatohepatitis (NASH)  -Pt was seen by transplant hepatology at [facility] on 11-05-2022. He has outpatient labs that showed + ANA and + anti-smooth muscle antibody.  -This calls into question whether his cirrhosis is caused by autoimmune hepatitis. 11/29/22 -Ammonia  66  >> 42 >> 52 -Treated with lactulose as ordered    Latest Ref Rng & Units 11/29/2022   11:13 AM 11/28/2022    3:23 AM 11/27/2022    4:02 AM  Hepatic Function  Total Protein 6.5 - 8.1 g/dL 4.6  4.2  4.8   Albumin 3.5 - 5.0 g/dL <1.5  <1.5  <1.5   AST 15 - 41 U/L 131  98  132   ALT 0 - 44 U/L 51  42  50   Alk Phosphatase 38 - 126 U/L 111  94  121   Total Bilirubin 0.3 - 1.2 mg/dL 2.0  1.8  2.2    Transition to full comfort care as above #1  5)Urinary retention -Foley catheter placed in the ED on admission -CT shows tip of the catheter is in the prostatic urethra---urology contacted,, Foley was repositioned --Patient denying dysuria and suprapubic tenderness at this moment. exchanged  Caude catheter on 11/26/2022 -Discussed with Dr. Alyson Ingles  from urology service recommends leaving Foley catheter in until patient can follow-up with him in the office -Repeat urine culture from 11/26/2022 without growth -Transition to full comfort care as above #1  6) social/ethics-- sister   [name] lives in [place].  -She is a patient next of kin and presumed decision maker at this time 11/28/22 -Conference with Pt's sister Rosine Door on speaker phone  and Niece Gildardo Pounds at bedside here --Family has decided on DNR/DNI -Continue to treat the treatable -RN Sherry Ruffing verified family's decision - 11/29/22 --Follow-up phone Conference with next of [place]  -- She declines PICC line placement --She declines platelet transfusion -She is requesting transition to full comfort care   7)Episode of hypotension--treated with  transfusion of PRBC, albumin and midodrine -Transition to full comfort care as above #1\  8) acute on chronic anemia---  11/29/22 Received a  total of 2 units of PRBC this admission -Patient with underlying liver cirrhosis, no acute bleeding noted-- -transitioned to full comfort care as above 1  9)Episode of hypoglycemia--- due to poor oral intake -Treated with IV dextrose infusion -Transitioned to full comfort care as above #1  Lactic acidosis/enterococcal and Acinetobacter bacteremia -Suspect this is due to decreased clearance from his hepatic cirrhosis, versus associated with active infection. -Following ID recommendations 2D echo performed; no vegetations appreciated.  Holding on TEE at this moment -Based on cultures sensitivity antibiotics will be narrow to Cipro and Zyvox with plans for 2 weeks of therapy. 11/29/22 -Stopped Zyvox due to worsening thrombocytopenia -Treated with Cipro for Acinetobacter bacteremia  (see blood cultures from 11/15/2022 with Acinetobacter and Enterococcus) --Zyvox discontinued on 11/28/2022 (it was  for Enterococcus bacteremia) -Infectious disease had initially recommended 2 weeks of oral antibiotics -Transitioned to full comfort care on 11/29/2022 as above #1  Crohn's disease of both small and large intestine with complication ([place]) -Pt with ileostomy.  -Comfort care as above #1  Bipolar -Treated with depakote, risperdal, sertraline, benztropine --Comfort care as above #1  History DVTs/PE -PTA was on Eliquis -Comfort care as above  #1  Pancytopenia -chronic. -Continue patient follow-up with hematology service. ---No overt bleeding appreciated. --11/29/22 Hold Eliquis as Hgb and platelets are drifting down Zyvox could be contributing to thrombocytopenia -Transitioned to full comfort care  PortaCath abnormality--chest imaging studies suggest fractured right IJ port catheter, recommend removal to avoid catheter fragment complications -Discussed risk Versus benefits of Port-A-Cath removal with patient's sister and niece -At this time they would rather leave the Port-A-Cath in place -Transition to full comfort care  Frequent falls/failure to thrive/generalized weakness and deconditioning -TSH, B12, folate and ammonia within normal limits -Physical therapy has recommended skilled nursing facility for rehab at discharge --Multifactorial including deconditioning, hypokalemia, possible infectious process. -Follow physical therapy evaluation/recommendations. -Currently with recommendation for a skilled nursing facility at discharge once medically stable.  Family Communication:   -  Conference with Pt's sister Rosine Door on speaker phone and Niece Gildardo Pounds at bedside here --Previously-Discussed with [name] , RN  and Ms [name] (from [facility]) previously  Consultants: Infectious disease/urology  Code Status: DNR/DNI/comfort care  DVT Prophylaxis: Comfort care   Procedures: As Listed in Progress Note Above  Antibiotics: Ciprofloxacin and Zyvox  Subjective: - -Follow-up phone Conference with next of [place]  -- She declines PICC line placement --She declines platelet transfusion -She is requesting transition to full comfort care   Objective: Vitals:   11/29/22 1500 11/29/22 1600 11/29/22 1639 11/29/22 1700  BP: (!) 119/37 (!) 112/34  (!) 125/34  Pulse: 95 92  91  Resp: '14 11  15  '$ Temp:   98 F (36.7 C)   TempSrc:   Oral   SpO2: 97% 97%  97%  Weight:      Height:         Intake/Output Summary (Last 24 hours) at 11/29/2022 1809 Last data filed at 11/29/2022 1521 Gross per 24 hour  Intake 1743.83 ml  Output 175 ml  Net 1568.83 ml   Weight change: 1 kg  Exam:  Physical Exam  Gen:-Lethargic on and off, cooperative overall  HEENT:- [place].AT, No sclera icterus Neck-Supple Neck,No JVD,.  Lungs-  CTAB , fair air movement bilaterally  CV- S1, S2 normal, RRR, Port-A-Cath in situ Abd-  +ve B.Sounds, Abd Soft, No tenderness, ostomy with mostly stool Extremity/Skin:- +ve  edema,   good pedal pulses , bruises/ecchymosis noted Psych-affect is flat,, oriented x 2, episodes of disorientation and confusion persist Neuro-General weakness no new focal deficits, no tremors GU-Foley in situ--- exchanged  Caude catheter on 11/26/2022   Data Reviewed: I have personally reviewed following labs and imaging studies  Basic Metabolic Panel: Recent Labs  Lab 11/26/22 0630 11/26/22 1255 11/27/22 0402 11/28/22 0323 11/29/22 1113  NA 129* 129* 133* 138 138  K 6.7* 6.0* 5.9* 4.6 5.6*  CL 101 101 106 111 111  CO2 21* 21* 18* 22 18*  GLUCOSE 95 84 68* 465* 91  BUN 60* 64* 66* 69* 85*  CREATININE 1.76* 1.88* 1.77* 1.68* 1.92*  CALCIUM 7.9* 8.3* 8.3* 7.6* 7.9*    Recent Labs  Lab 11/25/22 0419 11/26/22 0816  AMMONIA 42* 52*    CBC: Recent Labs  Lab 11/25/22 0419 11/26/22 0630 11/26/22 1952 11/27/22 0827 11/28/22 0323 11/29/22 1113  WBC 4.5 5.4  --  9.0 7.5 10.6*  NEUTROABS  --   --   --  6.8  --   --   HGB 7.3* 6.8* 8.1* 8.8* 6.7* 8.6*  HCT 23.1* 21.7* 24.8* 27.6* 21.3* 27.4*  MCV 103.1* 103.3*  --  100.7* 102.9* 101.9*  PLT 45* 51*  --  36* 24* 17*   CBG: Recent Labs  Lab 11/28/22 2204 11/29/22 0538 11/29/22 0722 11/29/22 1139 11/29/22 1638  GLUCAP 105* 81 84 83 89   Urine analysis:    Component Value Date/Time   COLORURINE YELLOW 11/27/2022 1810   APPEARANCEUR TURBID (A) 11/27/2022 1810   APPEARANCEUR Cloudy (A) 08/25/2022 1417    LABSPEC 1.014 11/27/2022 1810   PHURINE 5.0 11/27/2022 1810   GLUCOSEU NEGATIVE 11/27/2022 1810   HGBUR LARGE (A) 11/27/2022 1810   BILIRUBINUR NEGATIVE 11/27/2022 1810   BILIRUBINUR Negative 08/25/2022 1417   KETONESUR NEGATIVE 11/27/2022 1810   PROTEINUR 30 (A) 11/27/2022 1810   UROBILINOGEN 0.2 10/17/2014 1940   NITRITE NEGATIVE 11/27/2022 1810   LEUKOCYTESUR SMALL (A) 11/27/2022 1810   Sepsis Labs:  Recent Results (from the past 240 hour(s))  MRSA Next Gen by PCR, Nasal     Status: None   Collection Time: 11/26/22  9:49 AM   Specimen: Nasal Mucosa; Nasal Swab  Result Value Ref Range Status   MRSA by PCR Next Gen NOT DETECTED NOT DETECTED Final    Comment: (NOTE) The GeneXpert MRSA Assay (FDA approved for NASAL specimens only), is one component of a comprehensive MRSA colonization surveillance program. It is not intended to diagnose MRSA infection nor to guide or monitor treatment for MRSA infections. Test performance is not FDA  approved in patients less than 49 years old. Performed at [facility], [address] [place], [place] [address]   Remove and replace urinary cath (placed > 5 days) then obtain urine culture from new indwelling urinary catheter.     Status: None   Collection Time: 11/26/22  6:07 PM   Specimen: Urine, Catheterized  Result Value Ref Range Status   Specimen Description   Final    URINE, CATHETERIZED Performed at [facility], [address] [place], [place] [address]    Special Requests   Final    NONE Performed at [facility], [address] [place], [place] [address]    Culture   Final    NO GROWTH Performed at [facility], [place] [address], [place], [place] [address]    Report Status 11/28/2022 FINAL  Final    Scheduled Meds:  mouth rinse  15 mL Mouth Rinse 4 times per day   sodium chloride flush  3 mL Intravenous Q12H    sodium chloride 10 mL/hr at 11/29/22 1521   sodium chloride     dextrose 5 % and 0.9% NaCl 50 mL/hr at  11/29/22 1521   Procedures/Studies: [place] EKG SITE RITE  Result Date: 11/29/2022 If Site Rite image not attached, placement could not be confirmed due to current cardiac rhythm.  DG CHEST PORT 1 VIEW  Result Date: 11/26/2022 CLINICAL DATA:  Respiratory distress EXAM: PORTABLE CHEST - 1 VIEW COMPARISON:  11/15/2022 FINDINGS: The right IJ port catheter is fractured near the right IJ entry site and should not be used. Slight worsening of pulmonary vascular congestion, interstitial and alveolar opacities throughout the left lung. Heart size and mediastinal contours are within normal limits. Aortic Atherosclerosis (ICD10-170.0). Small right pleural effusion. Right shoulder arthroplasty component partially visualized. IMPRESSION: 1. Worsening left lung edema/infiltrate. 2. Fractured right IJ port catheter, recommend removal to avoid catheter fragment complications. Electronically Signed   By: Lucrezia Europe M.D.   On: 11/26/2022 08:15   ECHOCARDIOGRAM COMPLETE  Result Date: 11/18/2022    ECHOCARDIOGRAM REPORT   Patient Name:   MAIKA TIM Date of Exam: 11/18/2022 Medical Rec #:  [id number]      Height:       66.0 in Accession #:    [id number]     Weight:       228.4 lb Date of Birth:  08-18-1966     BSA:          2.116 m Patient Age:    55 years       BP:           105/56 mmHg Patient Gender: M              HR:           91 bpm. Exam Location:  [facility] Procedure: 2D Echo, Cardiac Doppler and Color Doppler Indications:    Bacteremia  History:        Patient has prior history of Echocardiogram examinations, most                 recent 11/13/2020. Cirrhosis; Risk Factors:Diabetes, Current                 Smoker and Dyslipidemia.  Sonographer:    Johny Chess RDCS Referring Phys: [phone] CARLOS MADERA  Sonographer Comments: Technically difficult study due to poor echo windows. Image acquisition challenging due to respiratory motion. IMPRESSIONS  1. Left ventricular ejection fraction, by estimation, is 60 to 65%.  The left ventricle has normal function. Left ventricular endocardial border not optimally defined to evaluate regional wall motion. Left ventricular diastolic parameters were normal.  2. Right ventricular systolic function is normal. The right ventricular size is normal. Tricuspid regurgitation signal is inadequate for assessing PA pressure.  3. Left atrial size was mildly dilated.  4. The mitral valve is grossly normal. Trivial mitral valve regurgitation.  5. The aortic valve is tricuspid. Aortic valve regurgitation is not visualized.  6. The inferior vena cava is normal in size with greater than 50% respiratory variability, suggesting right atrial pressure of 3 mmHg.  7. No obvious valvular vegetations noted with significantly limited views. Comparison(s): Prior images reviewed side by side. LVEF remains normal range at 60-65%. FINDINGS  Left Ventricle: Left ventricular ejection fraction, by estimation, is 60 to 65%. The left ventricle has normal function. Left ventricular endocardial border not optimally defined to evaluate regional wall motion. The left ventricular internal cavity size was normal in size. There is no left ventricular hypertrophy. Left ventricular diastolic parameters were normal. Right Ventricle: The right ventricular size is normal. No increase in right ventricular wall thickness. Right ventricular systolic function is normal. Tricuspid regurgitation signal is inadequate for assessing PA pressure. Left Atrium: Left atrial size was mildly dilated. Right Atrium: Right atrial size was normal in size. Pericardium: There is no evidence of pericardial effusion. Presence of epicardial fat layer. Mitral Valve: The mitral valve is grossly normal. Trivial mitral valve regurgitation. Tricuspid Valve: The tricuspid valve is grossly normal. Tricuspid valve regurgitation is trivial. Aortic Valve: The aortic valve is tricuspid. There is mild aortic valve annular calcification. Aortic valve regurgitation is  not visualized. Pulmonic Valve: The pulmonic valve was grossly normal. Pulmonic valve regurgitation is trivial. Aorta: The aortic root is normal in size and structure. Venous: The inferior vena cava is normal in size with greater than 50% respiratory variability, suggesting right atrial pressure of 3 mmHg. IAS/Shunts: No atrial level shunt detected by color flow Doppler.  LEFT VENTRICLE PLAX 2D LVIDd:         4.40 cm   Diastology LVIDs:         2.80 cm   LV e' medial:    9.25 cm/s LV PW:         0.90 cm   LV E/e' medial:  10.5 LV IVS:        0.70 cm   LV e' lateral:   15.30 cm/s LVOT diam:     1.80 cm   LV E/e' lateral: 6.4 LV SV:         54 LV SV Index:   26 LVOT Area:     2.54 cm  RIGHT VENTRICLE             IVC RV S prime:     13.60 cm/s  IVC diam: 1.40 cm TAPSE (M-mode): 4.0 cm LEFT ATRIUM           Index        RIGHT ATRIUM           Index LA diam:      3.00 cm 1.42 cm/m   RA Area:     10.70 cm LA Vol (A4C): 77.8 ml 36.77 ml/m  RA Volume:   23.60 ml  11.15 ml/m  AORTIC VALVE LVOT Vmax:   131.00 cm/s LVOT Vmean:  84.800 cm/s LVOT VTI:    0.214 m  AORTA Ao Root diam: 2.60 cm MITRAL VALVE MV Area (PHT): 3.93 cm  SHUNTS MV Decel Time: 193 msec    Systemic VTI:  0.21 m MV E velocity: 97.40 cm/s  Systemic Diam: 1.80 cm MV A velocity: 99.00 cm/s MV E/A ratio:  0.98 Rozann Lesches MD Electronically signed by Rozann Lesches MD Signature Date/Time: 11/18/2022/2:31:20 PM    Final    CT CHEST ABDOMEN PELVIS W CONTRAST  Result Date: 11/15/2022 CLINICAL DATA:  Sepsis, polytrauma, fall EXAM: CT CHEST, ABDOMEN, AND PELVIS WITH CONTRAST TECHNIQUE: Multidetector CT imaging of the chest, abdomen and pelvis was performed following the standard protocol during bolus administration of intravenous contrast. RADIATION DOSE REDUCTION: This exam was performed according to the departmental dose-optimization program which includes automated exposure control, adjustment of the mA and/or kV according to patient size and/or use  of iterative reconstruction technique. CONTRAST:  18m OMNIPAQUE IOHEXOL 300 MG/ML  SOLN COMPARISON:  CT abdomen pelvis, 09/06/2022 CT chest, 04/15/2022 FINDINGS: CT CHEST FINDINGS Cardiovascular: Right chest port catheter. Aortic atherosclerosis. Normal heart size. Left coronary artery calcifications. No pericardial effusion. Mediastinum/Nodes: No enlarged mediastinal, hilar, or axillary lymph nodes. Small incidental diverticulum of the posterior right trachea at the level of the thoracic inlet (series 2, image 8). Thyroid gland and esophagus demonstrate no significant findings. Lungs/Pleura: Moderate right pleural effusion and associated atelectasis or consolidation. Musculoskeletal: No chest wall abnormality. No acute osseous findings. Status post bilateral shoulder arthroplasty. CT ABDOMEN PELVIS FINDINGS Hepatobiliary: Coarse contour of the liver. No gallstones, gallbladder wall thickening, or biliary dilatation. Pancreas: Unremarkable. No pancreatic ductal dilatation or surrounding inflammatory changes. Spleen: Normal in size without significant abnormality. Adrenals/Urinary Tract: Adrenal glands are unremarkable. Kidneys are normal, without renal calculi, solid lesion, or hydronephrosis. Malpositioned Foley catheter, tip and balloon within the prostatic or bulbous urethra (series 5, image 111) Stomach/Bowel: Stomach is within normal limits. 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 Vascular/Lymphatic: Aortic atherosclerosis. 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 No enlarged abdominal or pelvic lymph nodes. Reproductive: No mass or other abnormality. Other: No abdominal wall hernia or abnormality. Small volume perihepatic ascites. Musculoskeletal: No acute osseous findings. 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 IMPRESSION: 1. No CT evidence of acute traumatic injury to the chest, abdomen, or pelvis. 2. Moderate right pleural effusion and  associated atelectasis or consolidation. 3. Malpositioned Foley catheter, tip and balloon within the prostatic or bulbous urethra. Recommend repositioning. 4. 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 5. Small volume perihepatic ascites. 6. Coarse contour of the liver and gastric varices, suggestive of cirrhosis. 7. Coronary artery disease. Aortic Atherosclerosis (ICD10-I70.0). Electronically Signed   By: ADelanna AhmadiM.D.   On: 11/15/2022 14:32   CT Head Wo Contrast  Result Date: 11/15/2022 CLINICAL DATA:  Three falls in the past 24 hours. EXAM: CT HEAD WITHOUT CONTRAST CT CERVICAL SPINE WITHOUT CONTRAST TECHNIQUE: Multidetector CT imaging of the head and cervical spine was performed following the standard protocol without intravenous contrast. Multiplanar CT image reconstructions of the cervical spine were also generated. RADIATION DOSE REDUCTION: This exam was performed according to the departmental dose-optimization program which includes automated exposure control, adjustment of the mA and/or kV according to patient size and/or use of iterative reconstruction technique. COMPARISON:  CT head 05/16/2021 FINDINGS: CT HEAD FINDINGS Brain: There is no acute intracranial hemorrhage, extra-axial fluid collection, or acute infarct. Parenchymal volume is normal. The ventricles are normal in size. Gray-white differentiation is preserved. The pituitary and suprasellar region are normal. There is no mass lesion. There  is no mass effect or midline shift. Vascular: No hyperdense vessel or unexpected calcification. Skull: Normal. Negative for fracture or focal lesion. Sinuses/Orbits: There is mild mucosal thickening in the paranasal sinuses. The globes and orbits are unremarkable. Other: None. CT CERVICAL SPINE FINDINGS Alignment: Normal. There is no jumped or perched facet or other evidence of traumatic malalignment. Skull base and vertebrae: Skull base alignment is maintained. Vertebral body  heights are preserved. There is no evidence of acute fracture. There is no suspicious osseous lesion. Soft tissues and spinal canal: No prevertebral fluid or swelling. No visible canal hematoma. Disc levels: Facet arthropathy is most advanced on the right at C3-C4. There is overall mild disc space narrowing at C4-C5 and C5-C6. There is no convincing high-grade spinal canal stenosis. Upper chest: Assessed on the separately dictated CT chest. Other: None. IMPRESSION: 1. No acute intracranial pathology. 2. No acute fracture or traumatic malalignment of the cervical spine. Electronically Signed   By: Valetta Mole M.D.   On: 11/15/2022 14:24   CT Cervical Spine Wo Contrast  Result Date: 11/15/2022 CLINICAL DATA:  Three falls in the past 24 hours. EXAM: CT HEAD WITHOUT CONTRAST CT CERVICAL SPINE WITHOUT CONTRAST TECHNIQUE: Multidetector CT imaging of the head and cervical spine was performed following the standard protocol without intravenous contrast. Multiplanar CT image reconstructions of the cervical spine were also generated. RADIATION DOSE REDUCTION: This exam was performed according to the departmental dose-optimization program which includes automated exposure control, adjustment of the mA and/or kV according to patient size and/or use of iterative reconstruction technique. COMPARISON:  CT head 05/16/2021 FINDINGS: CT HEAD FINDINGS Brain: There is no acute intracranial hemorrhage, extra-axial fluid collection, or acute infarct. Parenchymal volume is normal. The ventricles are normal in size. Gray-white differentiation is preserved. The pituitary and suprasellar region are normal. There is no mass lesion. There is no mass effect or midline shift. Vascular: No hyperdense vessel or unexpected calcification. Skull: Normal. Negative for fracture or focal lesion. Sinuses/Orbits: There is mild mucosal thickening in the paranasal sinuses. The globes and orbits are unremarkable. Other: None. CT CERVICAL SPINE FINDINGS  Alignment: Normal. There is no jumped or perched facet or other evidence of traumatic malalignment. Skull base and vertebrae: Skull base alignment is maintained. Vertebral body heights are preserved. There is no evidence of acute fracture. There is no suspicious osseous lesion. Soft tissues and spinal canal: No prevertebral fluid or swelling. No visible canal hematoma. Disc levels: Facet arthropathy is most advanced on the right at C3-C4. There is overall mild disc space narrowing at C4-C5 and C5-C6. There is no convincing high-grade spinal canal stenosis. Upper chest: Assessed on the separately dictated CT chest. Other: None. IMPRESSION: 1. No acute intracranial pathology. 2. No acute fracture or traumatic malalignment of the cervical spine. Electronically Signed   By: Valetta Mole M.D.   On: 11/15/2022 14:24   DG Chest Port 1 View  Result Date: 11/15/2022 CLINICAL DATA:  Sepsis, possible UTI EXAM: PORTABLE CHEST 1 VIEW COMPARISON:  CT chest dated 03/16/2022 FINDINGS: Lungs are clear.  No pleural effusion or pneumothorax. The heart is normal in size. Right chest power port terminates at the cavoatrial junction. Bilateral shoulder arthroplasty. IMPRESSION: No evidence of acute cardiopulmonary disease. Electronically Signed   By: Julian Hy M.D.   On: 11/15/2022 13:35    Dupree Givler Denton Brick, MD  [facility]  If 7PM-7AM, please contact night-coverage [email or web address] Password [email or web address] 11/29/2022, 6:09 PM   LOS: 13 days

## 2022-11-30 DIAGNOSIS — E876 Hypokalemia: Secondary | ICD-10-CM | POA: Diagnosis not present

## 2022-11-30 NOTE — TOC Progression Note (Addendum)
Transition of Care [facility]) - Progression Note    Patient Details  Name: [name] MRN: [id number] Date of Birth: 05-26-1966  [facility]) CM/SW Contact  Boneta Lucks, RN Phone Number: 11/30/2022, 8:38 AM  Clinical Narrative:   MD consulted TOC to send referral to Hospice for evaluation GIP vs residential.   Updated Maggie with [facility] and [place] with DSS. A court date has been set for March 7th for guardianship. They will appoint his sister guardianship.     Expected Discharge Plan: Long Term Acute Care (LTAC) Barriers to Discharge: Continued Medical Work up  Expected Discharge Plan and [place]

## 2022-11-30 NOTE — Progress Notes (Signed)
Pt lethargic and slept through the night. Oral care completed. PRN morphine given for PAINAD score of 4.

## 2022-11-30 NOTE — Progress Notes (Signed)
PROGRESS NOTE  DEMORIAN Lane [id number] DOB: 1966/07/28 DOA: 11/15/2022 PCP: [facility]  Brief History:  57 year old male with a history of diabetes mellitus type 2, pancytopenia, recurrent DVT/PE on Eliquis, RA, Crohn's colitis status post colectomy with ileostomy 2009 presenting with multiple falls.  The patient is a poor historian. Patient was seen on 05 November 2022 by liver transplant at [facility].  He was noted to have nonalcoholic fatty liver disease along with cirrhosis.  Autoimmune workup was started.  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  Has not had follow-up yet.   Patient been taking Demadex 20 mg a day prescribed by his cardiologist due to lower extremity edema.  Patient has not been started on Aldactone yet. This was mentioned during his hepatology consult. Patient states that he is taking Aldactone but there is no recording of this in his chart.   The patient was recently hospitalized from 09/06/2022 to 09/09/2022 for GI bleed.  EGD  09/07/22 with 1cm hh, Erythematous mucosa in the gastric fundus. Treated w/ (APC).GAVE w/ bleeding. Treated w/ (APC). Erythematous duodenopathy.  The patient was discharged home with pantoprazole twice daily.  In the ED, the patient was noted to be tachycardic in the 110s.  He was hypotensive with systolic blood pressure 83.  The patient was given 3.5 L of fluid.  Lactic acid was 3.7.  He was admitted for further evaluation and treatment of SIRS and altered mentation   11/30/22 -Awaiting hospice representatives to come and evaluate him and transition to residential hospice or give [place] the okay to transition to GIP  Assessment/Plan: 1)Acute Metabolic Encephalopathy---suspect due to underlying bacteremia compounded by Liver cirrhosis  -CT brain negative -Initial blood cultures from 11/15/2022 with Acinetobacter and Enterococcus -Continue current antibiotics and supportive care.. -Ammonia  66  >>  42 >> 52 -Repeat urine culture from 11/26/2022 without growth -Phone Conference with next of [place]  -- She declines PICC line placement --She declines platelet transfusion -She is requesting transition to full comfort care and transition to hospice care -11/30/22 -Awaiting hospice representatives to come and evaluate him and transition to residential hospice or give [place] the okay to transition to GIP  2)AKI----acute kidney injury ----suspect dehydration and hypovolemia related -Transition to comfort care measures as above #1  3)HYperkalemia----potassium 6.7  >>6 >>5.9>>4.6>.6 -Patient received hyperkalemia cocktail--including calcium gluconate, D50 insulin and Lokelma -Transition to comfort care measures as above #1  4)Liver cirrhosis secondary to nonalcoholic steatohepatitis (NASH)  -Pt was seen by transplant hepatology at [facility] on 11-05-2022. He has outpatient labs that showed + ANA and + anti-smooth muscle antibody.  -This calls into question whether his cirrhosis is caused by autoimmune hepatitis. -Ammonia  66  >> 42 >> 52 -Treated with lactulose as ordered Transition to full comfort care as above #1  5)Urinary retention -Foley catheter placed in the ED on admission -CT shows tip of the catheter is in the prostatic urethra---urology contacted,, Foley was repositioned --Patient denying dysuria and suprapubic tenderness at this moment. exchanged  Caude catheter on 11/26/2022 -Discussed with Dr. Alyson Ingles from urology service recommends leaving Foley catheter in until patient can follow-up with him in the office -Repeat urine culture from 11/26/2022 without growth -Transition to full comfort care as above #1  6) social/ethics-- sister   [name] lives in [place].  -She is a patient next of kin and presumed decision maker at this time -  Conference with Pt's sister Rosine Door on speaker phone and Niece Gildardo Pounds at bedside here --Family has decided on  DNR/DNI -Continue to treat the treatable -RN Sherry Ruffing verified family's decision --Follow-up phone Conference with next of [place]  -- She declines PICC line placement --She declines platelet transfusion -She is requesting transition to full comfort care  and Hospice care 11/30/22 -Awaiting hospice representatives to come and evaluate him and transition to residential hospice or give [place] the okay to transition to GIP  7)Episode of hypotension--treated with  transfusion of PRBC, albumin and midodrine -Transition to full comfort care as above #1\  8) acute on chronic anemia---  Received a  total of 2 units of PRBC this admission -Patient with underlying liver cirrhosis, no acute bleeding noted-- -transitioned to full comfort care as above 1  9)Episode of hypoglycemia--- due to poor oral intake -Treated with IV dextrose infusion -Transitioned to full comfort care as above #1  Lactic acidosis/enterococcal and Acinetobacter bacteremia -Suspect this is due to decreased clearance from his hepatic cirrhosis, versus associated with active infection. -Following ID recommendations 2D echo performed; no vegetations appreciated.  Holding on TEE at this moment -Based on cultures sensitivity antibiotics will be narrow to Cipro and Zyvox with plans for 2 weeks of therapy. -Stopped Zyvox due to worsening thrombocytopenia -Treated with Cipro for Acinetobacter bacteremia  (see blood cultures from 11/15/2022 with Acinetobacter and Enterococcus) --Zyvox discontinued on 11/28/2022 (it was  for Enterococcus bacteremia) -Infectious disease had initially recommended 2 weeks of oral antibiotics -Transitioned to full comfort care on 11/29/2022 as above #1  Crohn's disease of both small and large intestine with complication ([place]) -Pt with ileostomy.  -Comfort care as above #1  Bipolar -Treated with depakote, risperdal, sertraline, benztropine --Comfort care as above #1  History  DVTs/PE -PTA was on Eliquis -Comfort care as above #1  Pancytopenia -chronic. -Continue patient follow-up with hematology service. ---No overt bleeding appreciated. -Hold Eliquis as Hgb and platelets are drifting down Zyvox could be contributing to thrombocytopenia -Transitioned to full comfort care  PortaCath abnormality--chest imaging studies suggest fractured right IJ port catheter, recommend removal to avoid catheter fragment complications -Discussed risk Versus benefits of Port-A-Cath removal with patient's sister and niece -At this time they would rather leave the Port-A-Cath in place -Transition to full comfort care  Frequent falls/failure to thrive/generalized weakness and deconditioning -TSH, B12, folate and ammonia within normal limits -Physical therapy has recommended skilled nursing facility for rehab at discharge --Multifactorial including deconditioning, hypokalemia, possible infectious process. -Follow physical therapy evaluation/recommendations. -Currently with recommendation for a skilled nursing facility at discharge once medically stable. -11/30/22 -Awaiting hospice representatives to come and evaluate him and transition to residential hospice or give [place] the okay to transition to [place] Communication:   -  Conference with Pt's sister Rosine Door on speaker phone and Niece Gildardo Pounds at bedside here --Previously-Discussed with [name] , RN  and Ms [name] (from [facility]) previously  Consultants: Infectious disease/urology  Code Status: DNR/DNI/comfort care  DVT Prophylaxis: Comfort care   Procedures: As Listed in Progress Note Above  Antibiotics: Ciprofloxacin and Zyvox  Subjective: - -Follow-up phone Conference with next of [place]  -- She declines PICC line placement --She declines platelet transfusion -She is requesting transition to full comfort care and hospice care -11/30/22 -Awaiting hospice representatives  to come and evaluate him and transition to residential hospice or give [place] the okay to transition to GIP  Objective: Vitals:  11/30/22 0300 11/30/22 1007 11/30/22 1406 11/30/22 1809  BP: (!) 98/48 (!) 94/43 (!) 93/46   Pulse: 90 87 92   Resp: 14     Temp: (!) 97 F (36.1 C)   (!) 93.7 F (34.3 C)  TempSrc: Oral   Axillary  SpO2: (!) 89% 91% 95%   Weight:      Height:        Intake/Output Summary (Last 24 hours) at 11/30/2022 1858 Last data filed at 11/30/2022 1700 Gross per 24 hour  Intake 240 ml  Output 450 ml  Net -210 ml   Weight change:   Exam:  Physical Exam  Gen:-Lethargic on and off, cooperative overall  HEENT:- [place].AT, No sclera icterus Neck-Supple Neck,No JVD,.  Lungs-  CTAB , fair air movement bilaterally  CV- S1, S2 normal, RRR, Port-A-Cath in situ Abd-  +ve B.Sounds, Abd Soft, No tenderness, ostomy with mostly stool Extremity/Skin:- +ve  edema,   good pedal pulses , bruises/ecchymosis noted Psych-affect is flat,, oriented x 2, episodes of disorientation and confusion persist Neuro-General weakness no new focal deficits, no tremors GU-Foley in situ--- exchanged  Caude catheter on 11/26/2022 -11/30/22 -Awaiting hospice representatives to come and evaluate him and transition to residential hospice or give [place] the okay to transition to GIP   Data Reviewed: I have personally reviewed following labs and imaging studies  Basic Metabolic Panel: Recent Labs  Lab 11/26/22 0630 11/26/22 1255 11/27/22 0402 11/28/22 0323 11/29/22 1113  NA 129* 129* 133* 138 138  K 6.7* 6.0* 5.9* 4.6 5.6*  CL 101 101 106 111 111  CO2 21* 21* 18* 22 18*  GLUCOSE 95 84 68* 465* 91  BUN 60* 64* 66* 69* 85*  CREATININE 1.76* 1.88* 1.77* 1.68* 1.92*  CALCIUM 7.9* 8.3* 8.3* 7.6* 7.9*    Recent Labs  Lab 11/25/22 0419 11/26/22 0816  AMMONIA 42* 52*    CBC: Recent Labs  Lab 11/25/22 0419 11/26/22 0630 11/26/22 1952 11/27/22 0827 11/28/22 0323 11/29/22 1113  WBC 4.5 5.4   --  9.0 7.5 10.6*  NEUTROABS  --   --   --  6.8  --   --   HGB 7.3* 6.8* 8.1* 8.8* 6.7* 8.6*  HCT 23.1* 21.7* 24.8* 27.6* 21.3* 27.4*  MCV 103.1* 103.3*  --  100.7* 102.9* 101.9*  PLT 45* 51*  --  36* 24* 17*   CBG: Recent Labs  Lab 11/28/22 2204 11/29/22 0538 11/29/22 0722 11/29/22 1139 11/29/22 1638  GLUCAP 105* 81 84 83 89   Urine analysis:    Component Value Date/Time   COLORURINE YELLOW 11/27/2022 1810   APPEARANCEUR TURBID (A) 11/27/2022 1810   APPEARANCEUR Cloudy (A) 08/25/2022 1417   LABSPEC 1.014 11/27/2022 1810   PHURINE 5.0 11/27/2022 1810   GLUCOSEU NEGATIVE 11/27/2022 1810   HGBUR LARGE (A) 11/27/2022 1810   BILIRUBINUR NEGATIVE 11/27/2022 1810   BILIRUBINUR Negative 08/25/2022 1417   KETONESUR NEGATIVE 11/27/2022 1810   PROTEINUR 30 (A) 11/27/2022 1810   UROBILINOGEN 0.2 10/17/2014 1940   NITRITE NEGATIVE 11/27/2022 1810   LEUKOCYTESUR SMALL (A) 11/27/2022 1810   Sepsis Labs:  Recent Results (from the past 240 hour(s))  MRSA Next Gen by PCR, Nasal     Status: None   Collection Time: 11/26/22  9:49 AM   Specimen: Nasal Mucosa; Nasal Swab  Result Value Ref Range Status   MRSA by PCR Next Gen NOT DETECTED NOT DETECTED Final    Comment: (NOTE) The GeneXpert MRSA Assay (FDA approved  for NASAL specimens only), is one component of a comprehensive MRSA colonization surveillance program. It is not intended to diagnose MRSA infection nor to guide or monitor treatment for MRSA infections. Test performance is not FDA approved in patients less than 51 years old. Performed at [facility], [address] [place], [place] [address]   Remove and replace urinary cath (placed > 5 days) then obtain urine culture from new indwelling urinary catheter.     Status: None   Collection Time: 11/26/22  6:07 PM   Specimen: Urine, Catheterized  Result Value Ref Range Status   Specimen Description   Final    URINE, CATHETERIZED Performed at [facility], [address],  [place], [place] [address]    Special Requests   Final    NONE Performed at [facility], [address] [place], [place] [address]    Culture   Final    NO GROWTH Performed at [facility], [place] [address], [place], [place] [address]    Report Status 11/28/2022 FINAL  Final    Scheduled Meds:  mouth rinse  15 mL Mouth Rinse 4 times per day   sodium chloride flush  3 mL Intravenous Q12H    sodium chloride 10 mL/hr at 11/29/22 1521   sodium chloride     dextrose 5 % and 0.9% NaCl 50 mL/hr at 11/30/22 0004   Procedures/Studies: [place] EKG SITE RITE  Result Date: 11/29/2022 If Site Rite image not attached, placement could not be confirmed due to current cardiac rhythm.  DG CHEST PORT 1 VIEW  Result Date: 11/26/2022 CLINICAL DATA:  Respiratory distress EXAM: PORTABLE CHEST - 1 VIEW COMPARISON:  11/15/2022 FINDINGS: The right IJ port catheter is fractured near the right IJ entry site and should not be used. Slight worsening of pulmonary vascular congestion, interstitial and alveolar opacities throughout the left lung. Heart size and mediastinal contours are within normal limits. Aortic Atherosclerosis (ICD10-170.0). Small right pleural effusion. Right shoulder arthroplasty component partially visualized. IMPRESSION: 1. Worsening left lung edema/infiltrate. 2. Fractured right IJ port catheter, recommend removal to avoid catheter fragment complications. Electronically Signed   By: Lucrezia Europe M.D.   On: 11/26/2022 08:15   ECHOCARDIOGRAM COMPLETE  Result Date: 11/18/2022    ECHOCARDIOGRAM REPORT   Patient Name:   [name] Date of Exam: 11/18/2022 Medical Rec #:  [id number]      Height:       66.0 in Accession #:    [id number]     Weight:       228.4 lb Date of Birth:  [birth year]     BSA:          2.116 m Patient Age:    7 years       BP:           105/56 mmHg Patient Gender: M              HR:           91 bpm. Exam Location:  [facility] Procedure: 2D Echo, Cardiac Doppler and Color Doppler  Indications:    Bacteremia  History:        Patient has prior history of Echocardiogram examinations, most                 recent 11/13/2020. Cirrhosis; Risk Factors:Diabetes, Current                 Smoker and Dyslipidemia.  Sonographer:    Johny Chess  RDCS Referring Phys: [id number] CARLOS MADERA  Sonographer Comments: Technically difficult study due to poor echo windows. Image acquisition challenging due to respiratory motion. IMPRESSIONS  1. Left ventricular ejection fraction, by estimation, is 60 to 65%. The left ventricle has normal function. Left ventricular endocardial border not optimally defined to evaluate regional wall motion. Left ventricular diastolic parameters were normal.  2. Right ventricular systolic function is normal. The right ventricular size is normal. Tricuspid regurgitation signal is inadequate for assessing PA pressure.  3. Left atrial size was mildly dilated.  4. The mitral valve is grossly normal. Trivial mitral valve regurgitation.  5. The aortic valve is tricuspid. Aortic valve regurgitation is not visualized.  6. The inferior vena cava is normal in size with greater than 50% respiratory variability, suggesting right atrial pressure of 3 mmHg.  7. No obvious valvular vegetations noted with significantly limited views. Comparison(s): Prior images reviewed side by side. LVEF remains normal range at 60-65%. FINDINGS  Left Ventricle: Left ventricular ejection fraction, by estimation, is 60 to 65%. The left ventricle has normal function. Left ventricular endocardial border not optimally defined to evaluate regional wall motion. The left ventricular internal cavity size was normal in size. There is no left ventricular hypertrophy. Left ventricular diastolic parameters were normal. Right Ventricle: The right ventricular size is normal. No increase in right ventricular wall thickness. Right ventricular systolic function is normal. Tricuspid regurgitation signal is inadequate for assessing PA  pressure. Left Atrium: Left atrial size was mildly dilated. Right Atrium: Right atrial size was normal in size. Pericardium: There is no evidence of pericardial effusion. Presence of epicardial fat layer. Mitral Valve: The mitral valve is grossly normal. Trivial mitral valve regurgitation. Tricuspid Valve: The tricuspid valve is grossly normal. Tricuspid valve regurgitation is trivial. Aortic Valve: The aortic valve is tricuspid. There is mild aortic valve annular calcification. Aortic valve regurgitation is not visualized. Pulmonic Valve: The pulmonic valve was grossly normal. Pulmonic valve regurgitation is trivial. Aorta: The aortic root is normal in size and structure. Venous: The inferior vena cava is normal in size with greater than 50% respiratory variability, suggesting right atrial pressure of 3 mmHg. IAS/Shunts: No atrial level shunt detected by color flow Doppler.  LEFT VENTRICLE PLAX 2D LVIDd:         4.40 cm   Diastology LVIDs:         2.80 cm   LV e' medial:    9.25 cm/s LV PW:         0.90 cm   LV E/e' medial:  10.5 LV IVS:        0.70 cm   LV e' lateral:   15.30 cm/s LVOT diam:     1.80 cm   LV E/e' lateral: 6.4 LV SV:         54 LV SV Index:   26 LVOT Area:     2.54 cm  RIGHT VENTRICLE             IVC RV S prime:     13.60 cm/s  IVC diam: 1.40 cm TAPSE (M-mode): 4.0 cm LEFT ATRIUM           Index        RIGHT ATRIUM           Index LA diam:      3.00 cm 1.42 cm/m   RA Area:     10.70 cm LA Vol (A4C): 77.8 ml 36.77 ml/m  RA Volume:   23.60 ml  11.15 ml/m  AORTIC VALVE LVOT Vmax:   131.00 cm/s LVOT Vmean:  84.800 cm/s LVOT VTI:    0.214 m  AORTA Ao Root diam: 2.60 cm MITRAL VALVE MV Area (PHT): 3.93 cm    SHUNTS MV Decel Time: 193 msec    Systemic VTI:  0.21 m MV E velocity: 97.40 cm/s  Systemic Diam: 1.80 cm MV A velocity: 99.00 cm/s MV E/A ratio:  0.98 Rozann Lesches MD Electronically signed by Rozann Lesches MD Signature Date/Time: 11/18/2022/2:31:20 PM    Final    CT CHEST ABDOMEN PELVIS  W CONTRAST  Result Date: 11/15/2022 CLINICAL DATA:  Sepsis, polytrauma, fall EXAM: CT CHEST, ABDOMEN, AND PELVIS WITH CONTRAST TECHNIQUE: Multidetector CT imaging of the chest, abdomen and pelvis was performed following the standard protocol during bolus administration of intravenous contrast. RADIATION DOSE REDUCTION: This exam was performed according to the departmental dose-optimization program which includes automated exposure control, adjustment of the mA and/or kV according to patient size and/or use of iterative reconstruction technique. CONTRAST:  14m OMNIPAQUE IOHEXOL 300 MG/ML  SOLN COMPARISON:  CT abdomen pelvis, 09/06/2022 CT chest, 04/15/2022 FINDINGS: CT CHEST FINDINGS Cardiovascular: Right chest port catheter. Aortic atherosclerosis. Normal heart size. Left coronary artery calcifications. No pericardial effusion. Mediastinum/Nodes: No enlarged mediastinal, hilar, or axillary lymph nodes. Small incidental diverticulum of the posterior right trachea at the level of the thoracic inlet (series 2, image 8). Thyroid gland and esophagus demonstrate no significant findings. Lungs/Pleura: Moderate right pleural effusion and associated atelectasis or consolidation. Musculoskeletal: No chest wall abnormality. No acute osseous findings. Status post bilateral shoulder arthroplasty. CT ABDOMEN PELVIS FINDINGS Hepatobiliary: Coarse contour of the liver. No gallstones, gallbladder wall thickening, or biliary dilatation. Pancreas: Unremarkable. No pancreatic ductal dilatation or surrounding inflammatory changes. Spleen: Normal in size without significant abnormality. Adrenals/Urinary Tract: Adrenal glands are unremarkable. Kidneys are normal, without renal calculi, solid lesion, or hydronephrosis. Malpositioned Foley catheter, tip and balloon within the prostatic or bulbous urethra (series 5, image 111) Stomach/Bowel: Stomach is within normal limits. Status post total colectomy with right lower quadrant end  ileostomy. Vascular/Lymphatic: Aortic atherosclerosis. 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 No enlarged abdominal or pelvic lymph nodes. Reproductive: No mass or other abnormality. Other: No abdominal wall hernia or abnormality. Small volume perihepatic ascites. Musculoskeletal: No acute osseous findings. 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 IMPRESSION: 1. No CT evidence of acute traumatic injury to the chest, abdomen, or pelvis. 2. Moderate right pleural effusion and associated atelectasis or consolidation. 3. Malpositioned Foley catheter, tip and balloon within the prostatic or bulbous urethra. Recommend repositioning. 4. 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 5. Small volume perihepatic ascites. 6. Coarse contour of the liver and gastric varices, suggestive of cirrhosis. 7. Coronary artery disease. Aortic Atherosclerosis (ICD10-I70.0). Electronically Signed   By: ADelanna AhmadiM.D.   On: 11/15/2022 14:32   CT Head Wo Contrast  Result Date: 11/15/2022 CLINICAL DATA:  Three falls in the past 24 hours. EXAM: CT HEAD WITHOUT CONTRAST CT CERVICAL SPINE WITHOUT CONTRAST TECHNIQUE: Multidetector CT imaging of the head and cervical spine was performed following the standard protocol without intravenous contrast. Multiplanar CT image reconstructions of the cervical spine were also generated. RADIATION DOSE REDUCTION: This exam was performed according to the departmental dose-optimization program which includes automated exposure control, adjustment of the mA and/or kV according to patient size and/or use of iterative reconstruction technique. COMPARISON:  CT head 05/16/2021 FINDINGS: CT HEAD FINDINGS  Brain: There is no acute intracranial hemorrhage, extra-axial fluid collection, or acute infarct. Parenchymal volume is normal. The ventricles are normal in size. Gray-white differentiation is preserved. The pituitary and suprasellar region are  normal. There is no mass lesion. There is no mass effect or midline shift. Vascular: No hyperdense vessel or unexpected calcification. Skull: Normal. Negative for fracture or focal lesion. Sinuses/Orbits: There is mild mucosal thickening in the paranasal sinuses. The globes and orbits are unremarkable. Other: None. CT CERVICAL SPINE FINDINGS Alignment: Normal. There is no jumped or perched facet or other evidence of traumatic malalignment. Skull base and vertebrae: Skull base alignment is maintained. Vertebral body heights are preserved. There is no evidence of acute fracture. There is no suspicious osseous lesion. Soft tissues and spinal canal: No prevertebral fluid or swelling. No visible canal hematoma. Disc levels: Facet arthropathy is most advanced on the right at C3-C4. There is overall mild disc space narrowing at C4-C5 and C5-C6. There is no convincing high-grade spinal canal stenosis. Upper chest: Assessed on the separately dictated CT chest. Other: None. IMPRESSION: 1. No acute intracranial pathology. 2. No acute fracture or traumatic malalignment of the cervical spine. Electronically Signed   By: Valetta Mole M.D.   On: 11/15/2022 14:24   CT Cervical Spine Wo Contrast  Result Date: 11/15/2022 CLINICAL DATA:  Three falls in the past 24 hours. EXAM: CT HEAD WITHOUT CONTRAST CT CERVICAL SPINE WITHOUT CONTRAST TECHNIQUE: Multidetector CT imaging of the head and cervical spine was performed following the standard protocol without intravenous contrast. Multiplanar CT image reconstructions of the cervical spine were also generated. RADIATION DOSE REDUCTION: This exam was performed according to the departmental dose-optimization program which includes automated exposure control, adjustment of the mA and/or kV according to patient size and/or use of iterative reconstruction technique. COMPARISON:  CT head 05/16/2021 FINDINGS: CT HEAD FINDINGS Brain: There is no acute intracranial hemorrhage, extra-axial fluid  collection, or acute infarct. Parenchymal volume is normal. The ventricles are normal in size. Gray-white differentiation is preserved. The pituitary and suprasellar region are normal. There is no mass lesion. There is no mass effect or midline shift. Vascular: No hyperdense vessel or unexpected calcification. Skull: Normal. Negative for fracture or focal lesion. Sinuses/Orbits: There is mild mucosal thickening in the paranasal sinuses. The globes and orbits are unremarkable. Other: None. CT CERVICAL SPINE FINDINGS Alignment: Normal. There is no jumped or perched facet or other evidence of traumatic malalignment. Skull base and vertebrae: Skull base alignment is maintained. Vertebral body heights are preserved. There is no evidence of acute fracture. There is no suspicious osseous lesion. Soft tissues and spinal canal: No prevertebral fluid or swelling. No visible canal hematoma. Disc levels: Facet arthropathy is most advanced on the right at C3-C4. There is overall mild disc space narrowing at C4-C5 and C5-C6. There is no convincing high-grade spinal canal stenosis. Upper chest: Assessed on the separately dictated CT chest. Other: None. IMPRESSION: 1. No acute intracranial pathology. 2. No acute fracture or traumatic malalignment of the cervical spine. Electronically Signed   By: Valetta Mole M.D.   On: 11/15/2022 14:24   DG Chest Port 1 View  Result Date: 11/15/2022 CLINICAL DATA:  Sepsis, possible UTI EXAM: PORTABLE CHEST 1 VIEW COMPARISON:  CT chest dated 03/16/2022 FINDINGS: Lungs are clear.  No pleural effusion or pneumothorax. The heart is normal in size. Right chest power port terminates at the cavoatrial junction. Bilateral shoulder arthroplasty. IMPRESSION: No evidence of acute cardiopulmonary disease. Electronically Signed   By:  Julian Hy M.D.   On: 11/15/2022 13:35    Shelda Truby Denton Brick, MD  [facility]  If 7PM-7AM, please contact night-coverage [email or web address] Password  [facility] 11/30/2022, 6:58 PM   LOS: 14 days

## 2022-12-01 ENCOUNTER — Ambulatory Visit: Payer: Medicaid Other

## 2022-12-01 ENCOUNTER — Encounter (HOSPITAL_COMMUNITY): Payer: Medicaid Other | Admitting: Physical Therapy

## 2022-12-01 DIAGNOSIS — E876 Hypokalemia: Secondary | ICD-10-CM | POA: Diagnosis not present

## 2022-12-01 MED ORDER — GLYCOPYRROLATE 1 MG PO TABS: 1.0000 mg | ORAL_TABLET | ORAL | 0 refills | Status: AC | PRN

## 2022-12-01 NOTE — TOC Transition Note (Signed)
Transition of Care [facility]) - CM/SW Discharge Note   Patient Details  Name: [name] MRN: [id number] Date of Birth: [birth year]  [facility] CM/SW Contact:  Boneta Lucks, RN Phone Number: 12/01/2022, 9:21 AM   Clinical Narrative:   Patient accepted by [facility]. Consent signed, they will arrange transportation. RN calling report. CM updated Morey Hummingbird with DSS and Maggie his RN with [employer].   Final next level of care: [place] Barriers to Discharge: Barriers Resolved   Patient Goals and CMS Choice CMS Medicare.gov Compare Post Acute Care list provided to:: Patient Represenative (must comment) Choice offered to / list presented to : Sibling  Discharge Placement                 Patient to be transferred to facility by: EMS   Patient and family notified of of transfer: 12/01/22  Discharge Plan and Services Additional resources added to the After Visit Summary for       Social Determinants of Health (SDOH) Interventions SDOH Screenings   Food Insecurity: No Food Insecurity (11/23/2022)  Recent Concern: [place] Present (09/07/2022)  Housing: Low Risk  (11/23/2022)  Transportation Needs: No Transportation Needs (11/23/2022)  Utilities: Not At Risk (11/23/2022)  Tobacco Use: High Risk (11/15/2022)     Readmission Risk Interventions    12/01/2022    9:21 AM  Readmission Risk Prevention Plan  Transportation Screening Complete  Medication Review ([place]) Complete  PCP or Specialist appointment within 3-5 days of discharge Complete  HRI or [place] Complete  SW Recovery Care/Counseling Consult Complete  Palliative Care Screening Complete  [place] Not Applicable

## 2022-12-01 NOTE — Progress Notes (Signed)
Pt lethargic, slept through the night with respirations even and unlabored. PAINAD score of 1 noted.

## 2022-12-01 NOTE — Discharge Summary (Signed)
Physician Discharge Summary   Patient: [name] MRN: [id number] DOB: 05/20/1966  Admit date:     11/15/2022  Discharge date: 12/01/22  Discharge Physician: Deatra James   PCP: [facility]   Recommendations at discharge:   Follow up with  Hospice  11/30/2025 Dr. Mendel Corning had a  Conference with next of [place]  -- She declines PICC line placement --She declines platelet transfusion -She is requesting transition to full comfort care and hospice care    Discharge Diagnoses: Principal Problem:   Hypokalemia Active Problems:   Lactic acidosis   Acute metabolic encephalopathy   Liver cirrhosis secondary to nonalcoholic steatohepatitis (NASH) ([place])   Crohn's disease of both small and large intestine with complication ([place])   DM type 2 (diabetes mellitus, type 2) ([place])   Hypoalbuminemia due to protein-calorie malnutrition ([place])   History of DVT (deep vein thrombosis)   BIPOLAR AFFECTIVE DISORDER   Pleural effusion on right   Failure to thrive in adult  Resolved Problems:   * No resolved hospital problems. *   57 year old male with a history of diabetes mellitus type 2, pancytopenia, recurrent DVT/PE on Eliquis, RA, Crohn's colitis status post colectomy with ileostomy 2009 presenting with multiple falls.  The patient is a poor historian. Patient was seen on 05 November 2022 by liver transplant at [facility].  He was noted to have nonalcoholic fatty liver disease along with cirrhosis.  Autoimmune workup was started.  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  Has not had follow-up yet.   Patient been taking Demadex 20 mg a day prescribed by his cardiologist due to lower extremity edema.  Patient has not been started on Aldactone yet. This was mentioned during his hepatology consult. Patient states that he is taking Aldactone but there is no recording of this in his chart.    The patient was recently hospitalized from  09/06/2022 to 09/09/2022 for GI bleed.  EGD  09/07/22 with 1cm hh, Erythematous mucosa in the gastric fundus. Treated w/ (APC).GAVE w/ bleeding. Treated w/ (APC). Erythematous duodenopathy.  The patient was discharged home with pantoprazole twice daily.   In the ED, the patient was noted to be tachycardic in the 110s.  He was hypotensive with systolic blood pressure 83.  The patient was given 3.5 L of fluid.  Lactic acid was 3.7.  He was admitted for further evaluation and treatment of SIRS and altered mentation   12/01/22 -Awaiting hospice    1)Acute Metabolic Encephalopathy---suspect due to underlying bacteremia compounded by Liver cirrhosis  -CT brain negative -Initial blood cultures from 11/15/2022 with Acinetobacter and Enterococcus -Continue current antibiotics and supportive care.. -Ammonia  66  >> 42 >> 52 -Repeat urine culture from 11/26/2022 without growth -Phone Conference with next of [place]  -- She declines PICC line placement --She declines platelet transfusion -She is requesting transition to full comfort care and transition to hospice care -11/30/22 -Awaiting hospice representatives to come and evaluate him and transition to residential hospice or give [place] the okay to transition to GIP   2)AKI----acute kidney injury ----suspect dehydration and hypovolemia related -Transition to comfort care measures as above #1   3)HYperkalemia----potassium 6.7  >>6 >>5.9>>4.6>.6 -Patient received hyperkalemia cocktail--including calcium gluconate, D50 insulin and Lokelma -Transition to comfort care measures as above #1   4)Liver cirrhosis secondary to nonalcoholic steatohepatitis (NASH)  -Pt was seen by transplant hepatology at [facility] on 11-05-2022. He has outpatient labs that showed +  ANA and + anti-smooth muscle antibody.  -This calls into question whether his cirrhosis is caused by autoimmune hepatitis. -Ammonia  66  >> 42 >> 52 -Treated with lactulose as  ordered Transition to full comfort care as above #1   5)Urinary retention -Foley catheter placed in the ED on admission -CT shows tip of the catheter is in the prostatic urethra---urology contacted,, Foley was repositioned --Patient denying dysuria and suprapubic tenderness at this moment. exchanged  Caude catheter on 11/26/2022 -Discussed with Dr. Alyson Ingles from urology service recommends leaving Foley catheter in until patient can follow-up with him in the office -Repeat urine culture from 11/26/2022 without growth -Transition to full comfort care as above #1   6) social/ethics-- sister   [name] lives in [place].  -She is a patient next of kin and presumed decision maker at this time -Conference with Pt's sister Rosine Door on speaker phone and Niece Gildardo Pounds at bedside here --Family has decided on DNR/DNI -Continue to treat the treatable -RN Sherry Ruffing verified family's decision --Follow-up phone Conference with next of [place]  -- She declines PICC line placement --She declines platelet transfusion -She is requesting transition to full comfort care  and Hospice care 11/30/22 -Awaiting hospice representatives to come and evaluate him and transition to residential hospice or give [place] the okay to transition to [facility]   7)Episode of hypotension--treated with  transfusion of PRBC, albumin and midodrine -Transition to full comfort care as above #1\   8) acute on chronic anemia---  Received a  total of 2 units of PRBC this admission -Patient with underlying liver cirrhosis, no acute bleeding noted-- -transitioned to full comfort care as above 1   9)Episode of hypoglycemia--- due to poor oral intake -Treated with IV dextrose infusion -Transitioned to full comfort care as above #1   Lactic acidosis/enterococcal and Acinetobacter bacteremia -Suspect this is due to decreased clearance from his hepatic cirrhosis, versus associated with active  infection. -Following ID recommendations 2D echo performed; no vegetations appreciated.  Holding on TEE at this moment -Based on cultures sensitivity antibiotics will be narrow to Cipro and Zyvox with plans for 2 weeks of therapy. -Stopped Zyvox due to worsening thrombocytopenia -Treated with Cipro for Acinetobacter bacteremia  (see blood cultures from 11/15/2022 with Acinetobacter and Enterococcus) --Zyvox discontinued on 11/28/2022 (it was  for Enterococcus bacteremia) -Infectious disease had initially recommended 2 weeks of oral antibiotics -Transitioned to full comfort care on 11/29/2022 as above #1   Crohn's disease of both small and large intestine with complication ([place]) -Pt with ileostomy.  -Comfort care as above #1   Bipolar -Treated with depakote, risperdal, sertraline, benztropine --Comfort care as above #1   History DVTs/PE -PTA was on Eliquis -Comfort care as above #1   Pancytopenia -chronic. -Continue patient follow-up with hematology service. ---No overt bleeding appreciated. -Hold Eliquis as Hgb and platelets are drifting down Zyvox could be contributing to thrombocytopenia -Transitioned to full comfort care   PortaCath abnormality--chest imaging studies suggest fractured right IJ port catheter, recommend removal to avoid catheter fragment complications -Discussed risk Versus benefits of Port-A-Cath removal with patient's sister and niece -At this time they would rather leave the Port-A-Cath in place -Transition to full comfort care   Frequent falls/failure to thrive/generalized weakness and deconditioning -TSH, B12, folate and ammonia within normal limits -Physical therapy has recommended skilled nursing facility for rehab at discharge --Multifactorial including deconditioning, hypokalemia, possible infectious process. -Follow physical therapy evaluation/recommendations. -Currently with recommendation for a skilled nursing  facility at discharge once medically  stable. -11/30/22 -Awaiting hospice representatives to come and evaluate him and transition to residential hospice or give [place] the okay to transition to [place] Communication:   -  Conference with Pt's sister Rosine Door on speaker phone and Niece Gildardo Pounds at bedside here --Previously-Discussed with [name] , RN  and Ms [name] (from [place]) previously   Consultants: Infectious disease/urology   Code Status: DNR/DNI/comfort care   DVT Prophylaxis: Comfort care     Disposition: Hospice care Diet recommendation:  Discharge Diet Orders (From admission, onward)     Start     Ordered   12/01/22 0000  Diet - low sodium heart healthy        12/01/22 0907           Regular diet DISCHARGE MEDICATION: Allergies as of 12/01/2022       Reactions   Penicillins Anaphylaxis   Bactrim [sulfamethoxazole-trimethoprim] Other (See Comments)   ABD CRAMPS AND DIARRHEA Welps/itch   Oxycodone-acetaminophen Other (See Comments)   Other reaction(s): Swelling   Remicade [infliximab] Other (See Comments)   COULDN'T BREATHE   Aspirin    REACTION: unknown reaction   Codeine    REACTION: unknown reaction   Ibuprofen    REACTION: unknown reaction   Macrobid [nitrofurantoin] Itching   Tramadol Nausea Only   Other reaction(s): Nausea   Tramadol Hcl    REACTION: unknown reaction   Vicodin [hydrocodone-acetaminophen] Nausea Only        Medication List     STOP taking these medications    atorvastatin 80 MG tablet Commonly known as: LIPITOR   cyanocobalamin 1000 MCG tablet   Eliquis 5 MG Tabs tablet Generic drug: apixaban   ferrous sulfate 325 (65 FE) MG EC tablet   folic acid 1 MG tablet Commonly known as: FOLVITE   gabapentin 300 MG capsule Commonly known as: NEURONTIN   hydroxychloroquine 200 MG tablet Commonly known as: PLAQUENIL   metoprolol succinate 25 MG 24 hr tablet Commonly known as: TOPROL-XL   pantoprazole 40 MG tablet Commonly known as:  PROTONIX   torsemide 20 MG tablet Commonly known as: DEMADEX   Walker Wheels Misc       TAKE these medications    albuterol (2.5 MG/3ML) 0.083% nebulizer solution Commonly known as: PROVENTIL Take 2.5 mg by nebulization every 6 (six) hours as needed for wheezing or shortness of breath.   albuterol 108 (90 Base) MCG/ACT inhaler Commonly known as: VENTOLIN HFA Inhale 2 puffs into the lungs every 6 (six) hours as needed for wheezing or shortness of breath.   benztropine 0.5 MG tablet Commonly known as: COGENTIN Take 0.5 mg by mouth daily.   Calcium 600/Vitamin D3 600-20 MG-MCG Tabs Generic drug: Calcium Carb-Cholecalciferol TAKE (1) TABLET BY MOUTH THREE TIMES DAILY AFTER MEALS.   divalproex 250 MG DR tablet Commonly known as: DEPAKOTE Take 250 mg by mouth daily.   divalproex 500 MG DR tablet Commonly known as: DEPAKOTE Take 1,000 mg by mouth at bedtime.   glycopyrrolate 1 MG tablet Commonly known as: ROBINUL Take 1 tablet (1 mg total) by mouth every 4 (four) hours as needed (excessive secretions).   nitroGLYCERIN 0.4 MG SL tablet Commonly known as: NITROSTAT Place 1 tablet (0.4 mg total) under the tongue every 5 (five) minutes as needed for chest pain.   risperiDONE 1 MG tablet Commonly known as: RISPERDAL Take 1 mg by mouth at bedtime.   risperiDONE 3 MG tablet Commonly  known as: RISPERDAL Take 6 mg by mouth at bedtime.   sertraline 50 MG tablet Commonly known as: ZOLOFT Take 50 mg by mouth daily.   tamsulosin 0.4 MG Caps capsule Commonly known as: FLOMAX Take 0.4 mg by mouth daily.   traZODone 100 MG tablet Commonly known as: DESYREL Take 100 mg by mouth at bedtime.               Discharge Care Instructions  (From admission, onward)           Start     Ordered   12/01/22 0000  Discharge wound care:       Comments: PER RN   12/01/22 0907            Discharge Exam: Filed Weights   11/27/22 0309 11/28/22 0500 11/29/22 0500   Weight: 102 kg 102.7 kg 103.7 kg        General:  Lethargic, cooperative, dozing on and off  HEENT:  Normocephalic, PERRL, otherwise with in Normal limits   Neuro:  CNII-XII intact. , normal motor and sensation, reflexes intact   Lungs:   Clear to auscultation BL, Respirations unlabored,  No wheezes / crackles  Cardio:    S1/S2, RRR, No murmure, No Rubs or Gallops   Abdomen:  Soft, non-tender, bowel sounds active all four quadrants, no guarding or peritoneal signs.  Muscular  skeletal:  Limited exam -severe global generalized weaknesses - in bed, able to move all 4 extremities,   2+ pulses,  symmetric, No pitting edema  Skin:  Dry, warm to touch, negative for any Rashes,   GU-Foley in situ--- exchanged  Caude catheter on 11/26/2022 -11/30/22 -Awaiting hospice representatives  Wounds: Please see nursing documentation  Pressure Injury 11/27/22 Sacrum Right Stage 2 -  Partial thickness loss of dermis presenting as a shallow open injury with a red, pink wound bed without slough. (Active)  11/27/22 0547  Location: Sacrum  Location Orientation: Right  Staging: Stage 2 -  Partial thickness loss of dermis presenting as a shallow open injury with a red, pink wound bed without slough.  Wound Description (Comments):   Present on Admission:   Dressing Type Foam - Lift dressing to assess site every shift 11/30/22 2237          Condition at discharge: poor  The results of significant diagnostics from this hospitalization (including imaging, microbiology, ancillary and laboratory) are listed below for reference.   Imaging Studies: [place] EKG SITE RITE  Result Date: 11/29/2022 If [facility] image not attached, placement could not be confirmed due to current cardiac rhythm.  DG CHEST PORT 1 VIEW  Result Date: 11/26/2022 CLINICAL DATA:  Respiratory distress EXAM: PORTABLE CHEST - 1 VIEW COMPARISON:  11/15/2022 FINDINGS: The right IJ port catheter is fractured near the right IJ entry site  and should not be used. Slight worsening of pulmonary vascular congestion, interstitial and alveolar opacities throughout the left lung. Heart size and mediastinal contours are within normal limits. Aortic Atherosclerosis (ICD10-170.0). Small right pleural effusion. Right shoulder arthroplasty component partially visualized. IMPRESSION: 1. Worsening left lung edema/infiltrate. 2. Fractured right IJ port catheter, recommend removal to avoid catheter fragment complications. Electronically Signed   By: Lucrezia Europe M.D.   On: 11/26/2022 08:15   ECHOCARDIOGRAM COMPLETE  Result Date: 11/18/2022    ECHOCARDIOGRAM REPORT   Patient Name:   LAVALE SENDER Date of Exam: 11/18/2022 Medical Rec #:  [id number]      Height:  66.0 in Accession #:    [id number]     Weight:       228.4 lb Date of Birth:  [birth year]     BSA:          2.116 m Patient Age:    19 years       BP:           105/56 mmHg Patient Gender: M              HR:           91 bpm. Exam Location:  [facility] Procedure: 2D Echo, Cardiac Doppler and Color Doppler Indications:    Bacteremia  History:        Patient has prior history of Echocardiogram examinations, most                 recent 11/13/2020. Cirrhosis; Risk Factors:Diabetes, Current                 Smoker and Dyslipidemia.  Sonographer:    Johny Chess RDCS Referring Phys: [phone] CARLOS MADERA  Sonographer Comments: Technically difficult study due to poor echo windows. Image acquisition challenging due to respiratory motion. IMPRESSIONS  1. Left ventricular ejection fraction, by estimation, is 60 to 65%. The left ventricle has normal function. Left ventricular endocardial border not optimally defined to evaluate regional wall motion. Left ventricular diastolic parameters were normal.  2. Right ventricular systolic function is normal. The right ventricular size is normal. Tricuspid regurgitation signal is inadequate for assessing PA pressure.  3. Left atrial size was mildly dilated.  4. The mitral  valve is grossly normal. Trivial mitral valve regurgitation.  5. The aortic valve is tricuspid. Aortic valve regurgitation is not visualized.  6. The inferior vena cava is normal in size with greater than 50% respiratory variability, suggesting right atrial pressure of 3 mmHg.  7. No obvious valvular vegetations noted with significantly limited views. Comparison(s): Prior images reviewed side by side. LVEF remains normal range at 60-65%. FINDINGS  Left Ventricle: Left ventricular ejection fraction, by estimation, is 60 to 65%. The left ventricle has normal function. Left ventricular endocardial border not optimally defined to evaluate regional wall motion. The left ventricular internal cavity size was normal in size. There is no left ventricular hypertrophy. Left ventricular diastolic parameters were normal. Right Ventricle: The right ventricular size is normal. No increase in right ventricular wall thickness. Right ventricular systolic function is normal. Tricuspid regurgitation signal is inadequate for assessing PA pressure. Left Atrium: Left atrial size was mildly dilated. Right Atrium: Right atrial size was normal in size. Pericardium: There is no evidence of pericardial effusion. Presence of epicardial fat layer. Mitral Valve: The mitral valve is grossly normal. Trivial mitral valve regurgitation. Tricuspid Valve: The tricuspid valve is grossly normal. Tricuspid valve regurgitation is trivial. Aortic Valve: The aortic valve is tricuspid. There is mild aortic valve annular calcification. Aortic valve regurgitation is not visualized. Pulmonic Valve: The pulmonic valve was grossly normal. Pulmonic valve regurgitation is trivial. Aorta: The aortic root is normal in size and structure. Venous: The inferior vena cava is normal in size with greater than 50% respiratory variability, suggesting right atrial pressure of 3 mmHg. IAS/Shunts: No atrial level shunt detected by color flow Doppler.  LEFT VENTRICLE PLAX 2D  LVIDd:         4.40 cm   Diastology LVIDs:         2.80 cm   LV e' medial:    9.25 cm/s  LV PW:         0.90 cm   LV E/e' medial:  10.5 LV IVS:        0.70 cm   LV e' lateral:   15.30 cm/s LVOT diam:     1.80 cm   LV E/e' lateral: 6.4 LV SV:         54 LV SV Index:   26 LVOT Area:     2.54 cm  RIGHT VENTRICLE             IVC RV S prime:     13.60 cm/s  IVC diam: 1.40 cm TAPSE (M-mode): 4.0 cm LEFT ATRIUM           Index        RIGHT ATRIUM           Index LA diam:      3.00 cm 1.42 cm/m   RA Area:     10.70 cm LA Vol (A4C): 77.8 ml 36.77 ml/m  RA Volume:   23.60 ml  11.15 ml/m  AORTIC VALVE LVOT Vmax:   131.00 cm/s LVOT Vmean:  84.800 cm/s LVOT VTI:    0.214 m  AORTA Ao Root diam: 2.60 cm MITRAL VALVE MV Area (PHT): 3.93 cm    SHUNTS MV Decel Time: 193 msec    Systemic VTI:  0.21 m MV E velocity: 97.40 cm/s  Systemic Diam: 1.80 cm MV A velocity: 99.00 cm/s MV E/A ratio:  0.98 Rozann Lesches MD Electronically signed by Rozann Lesches MD Signature Date/Time: 11/18/2022/2:31:20 PM    Final    CT CHEST ABDOMEN PELVIS W CONTRAST  Result Date: 11/15/2022 CLINICAL DATA:  Sepsis, polytrauma, fall EXAM: CT CHEST, ABDOMEN, AND PELVIS WITH CONTRAST TECHNIQUE: Multidetector CT imaging of the chest, abdomen and pelvis was performed following the standard protocol during bolus administration of intravenous contrast. RADIATION DOSE REDUCTION: This exam was performed according to the departmental dose-optimization program which includes automated exposure control, adjustment of the mA and/or kV according to patient size and/or use of iterative reconstruction technique. CONTRAST:  148m OMNIPAQUE IOHEXOL 300 MG/ML  SOLN COMPARISON:  CT abdomen pelvis, 09/06/2022 CT chest, 04/15/2022 FINDINGS: CT CHEST FINDINGS Cardiovascular: Right chest port catheter. Aortic atherosclerosis. Normal heart size. Left coronary artery calcifications. No pericardial effusion. Mediastinum/Nodes: No enlarged mediastinal, hilar, or axillary lymph  nodes. Small incidental diverticulum of the posterior right trachea at the level of the thoracic inlet (series 2, image 8). Thyroid gland and esophagus demonstrate no significant findings. Lungs/Pleura: Moderate right pleural effusion and associated atelectasis or consolidation. Musculoskeletal: No chest wall abnormality. No acute osseous findings. Status post bilateral shoulder arthroplasty. CT ABDOMEN PELVIS FINDINGS Hepatobiliary: Coarse contour of the liver. No gallstones, gallbladder wall thickening, or biliary dilatation. Pancreas: Unremarkable. No pancreatic ductal dilatation or surrounding inflammatory changes. Spleen: Normal in size without significant abnormality. Adrenals/Urinary Tract: Adrenal glands are unremarkable. Kidneys are normal, without renal calculi, solid lesion, or hydronephrosis. Malpositioned Foley catheter, tip and balloon within the prostatic or bulbous urethra (series 5, image 111) Stomach/Bowel: Stomach is within normal limits. 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 Vascular/Lymphatic: Aortic atherosclerosis. 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 No enlarged abdominal or pelvic lymph nodes. Reproductive: No mass or other abnormality. Other: No abdominal wall hernia or abnormality. Small volume perihepatic ascites. Musculoskeletal: No acute osseous findings. 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 IMPRESSION: 1. No CT evidence of acute traumatic injury to the chest,  abdomen, or pelvis. 2. Moderate right pleural effusion and associated atelectasis or consolidation. 3. Malpositioned Foley catheter, tip and balloon within the prostatic or bulbous urethra. Recommend repositioning. 4. 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 5. Small volume perihepatic ascites. 6. Coarse contour of the liver and gastric varices, suggestive of cirrhosis. 7. Coronary artery disease. Aortic Atherosclerosis  (ICD10-I70.0). Electronically Signed   By: Delanna Ahmadi M.D.   On: 11/15/2022 14:32   CT Head Wo Contrast  Result Date: 11/15/2022 CLINICAL DATA:  Three falls in the past 24 hours. EXAM: CT HEAD WITHOUT CONTRAST CT CERVICAL SPINE WITHOUT CONTRAST TECHNIQUE: Multidetector CT imaging of the head and cervical spine was performed following the standard protocol without intravenous contrast. Multiplanar CT image reconstructions of the cervical spine were also generated. RADIATION DOSE REDUCTION: This exam was performed according to the departmental dose-optimization program which includes automated exposure control, adjustment of the mA and/or kV according to patient size and/or use of iterative reconstruction technique. COMPARISON:  CT head 05/16/2021 FINDINGS: CT HEAD FINDINGS Brain: There is no acute intracranial hemorrhage, extra-axial fluid collection, or acute infarct. Parenchymal volume is normal. The ventricles are normal in size. Gray-white differentiation is preserved. The pituitary and suprasellar region are normal. There is no mass lesion. There is no mass effect or midline shift. Vascular: No hyperdense vessel or unexpected calcification. Skull: Normal. Negative for fracture or focal lesion. Sinuses/Orbits: There is mild mucosal thickening in the paranasal sinuses. The globes and orbits are unremarkable. Other: None. CT CERVICAL SPINE FINDINGS Alignment: Normal. There is no jumped or perched facet or other evidence of traumatic malalignment. Skull base and vertebrae: Skull base alignment is maintained. Vertebral body heights are preserved. There is no evidence of acute fracture. There is no suspicious osseous lesion. Soft tissues and spinal canal: No prevertebral fluid or swelling. No visible canal hematoma. Disc levels: Facet arthropathy is most advanced on the right at C3-C4. There is overall mild disc space narrowing at C4-C5 and C5-C6. There is no convincing high-grade spinal canal stenosis. Upper  chest: Assessed on the separately dictated CT chest. Other: None. IMPRESSION: 1. No acute intracranial pathology. 2. No acute fracture or traumatic malalignment of the cervical spine. Electronically Signed   By: Valetta Mole M.D.   On: 11/15/2022 14:24   CT Cervical Spine Wo Contrast  Result Date: 11/15/2022 CLINICAL DATA:  Three falls in the past 24 hours. EXAM: CT HEAD WITHOUT CONTRAST CT CERVICAL SPINE WITHOUT CONTRAST TECHNIQUE: Multidetector CT imaging of the head and cervical spine was performed following the standard protocol without intravenous contrast. Multiplanar CT image reconstructions of the cervical spine were also generated. RADIATION DOSE REDUCTION: This exam was performed according to the departmental dose-optimization program which includes automated exposure control, adjustment of the mA and/or kV according to patient size and/or use of iterative reconstruction technique. COMPARISON:  CT head 05/16/2021 FINDINGS: CT HEAD FINDINGS Brain: There is no acute intracranial hemorrhage, extra-axial fluid collection, or acute infarct. Parenchymal volume is normal. The ventricles are normal in size. Gray-white differentiation is preserved. The pituitary and suprasellar region are normal. There is no mass lesion. There is no mass effect or midline shift. Vascular: No hyperdense vessel or unexpected calcification. Skull: Normal. Negative for fracture or focal lesion. Sinuses/Orbits: There is mild mucosal thickening in the paranasal sinuses. The globes and orbits are unremarkable. Other: None. CT CERVICAL SPINE FINDINGS Alignment: Normal. There is no jumped or perched facet or other evidence of traumatic malalignment. Skull base  and vertebrae: Skull base alignment is maintained. Vertebral body heights are preserved. There is no evidence of acute fracture. There is no suspicious osseous lesion. Soft tissues and spinal canal: No prevertebral fluid or swelling. No visible canal hematoma. Disc levels: Facet  arthropathy is most advanced on the right at C3-C4. There is overall mild disc space narrowing at C4-C5 and C5-C6. There is no convincing high-grade spinal canal stenosis. Upper chest: Assessed on the separately dictated CT chest. Other: None. IMPRESSION: 1. No acute intracranial pathology. 2. No acute fracture or traumatic malalignment of the cervical spine. Electronically Signed   By: Valetta Mole M.D.   On: 11/15/2022 14:24   DG Chest Port 1 View  Result Date: 11/15/2022 CLINICAL DATA:  Sepsis, possible UTI EXAM: PORTABLE CHEST 1 VIEW COMPARISON:  CT chest dated 03/16/2022 FINDINGS: Lungs are clear.  No pleural effusion or pneumothorax. The heart is normal in size. Right chest power port terminates at the cavoatrial junction. Bilateral shoulder arthroplasty. IMPRESSION: No evidence of acute cardiopulmonary disease. Electronically Signed   By: Julian Hy M.D.   On: 11/15/2022 13:35    Microbiology: Results for orders placed or performed during the hospital encounter of 11/15/22  Blood Culture (routine x 2)     Status: Abnormal   Collection Time: 11/15/22 12:23 PM   Specimen: BLOOD RIGHT HAND  Result Value Ref Range Status   Specimen Description   Final    BLOOD RIGHT HAND Performed at [facility], [address] [place], [place] [address]    Special Requests   Final    BOTTLES DRAWN AEROBIC AND ANAEROBIC Blood Culture adequate volume Performed at [facility], [address], [place], [place] [address]    Culture  Setup Time   Final    GRAM POSITIVE COCCI BOTTLES DRAWN AEROBIC AND ANAEROBIC Gram Stain Report Called to,Read Back By and Verified With: DILDY,V'@[id number]'$  BY MATTHEWS B 2.13.2024 CRITICAL RESULT CALLED TO, READ BACK BY AND VERIFIED WITH: PHARMD FRANK WILSON  ON 11/16/22 @ 1444 BY DRT Performed at [facility], [place] [address] [place], [place] [address]    Culture (A)  Final    ACINETOBACTER CALCOACETICUS/BAUMANNII COMPLEX ENTEROCOCCUS FAECALIS    Report Status  11/18/2022 FINAL  Final   Organism ID, Bacteria ACINETOBACTER CALCOACETICUS/BAUMANNII COMPLEX  Final   Organism ID, Bacteria ENTEROCOCCUS FAECALIS  Final      Susceptibility   Acinetobacter calcoaceticus/baumannii complex - MIC*    CEFTAZIDIME 8 SENSITIVE Sensitive     CIPROFLOXACIN 1 SENSITIVE Sensitive     GENTAMICIN 8 INTERMEDIATE Intermediate     IMIPENEM <=0.25 SENSITIVE Sensitive     PIP/TAZO <=4 SENSITIVE Sensitive     TRIMETH/SULFA <=20 SENSITIVE Sensitive     AMPICILLIN/SULBACTAM <=2 SENSITIVE Sensitive     * ACINETOBACTER CALCOACETICUS/BAUMANNII COMPLEX   Enterococcus faecalis - MIC*    AMPICILLIN <=2 SENSITIVE Sensitive     VANCOMYCIN 1 SENSITIVE Sensitive     GENTAMICIN SYNERGY RESISTANT Resistant     * ENTEROCOCCUS FAECALIS  Blood Culture ID Panel (Reflexed)     Status: Abnormal   Collection Time: 11/15/22 12:23 PM  Result Value Ref Range Status   Enterococcus faecalis NOT DETECTED NOT DETECTED Final   Enterococcus Faecium NOT DETECTED NOT DETECTED Final   Listeria monocytogenes NOT DETECTED NOT DETECTED Final   Staphylococcus species NOT DETECTED NOT DETECTED Final   Staphylococcus aureus (BCID) NOT DETECTED NOT DETECTED Final   Staphylococcus epidermidis NOT DETECTED NOT DETECTED Final   Staphylococcus  lugdunensis NOT DETECTED NOT DETECTED Final   Streptococcus species NOT DETECTED NOT DETECTED Final   Streptococcus agalactiae NOT DETECTED NOT DETECTED Final   Streptococcus pneumoniae NOT DETECTED NOT DETECTED Final   Streptococcus pyogenes NOT DETECTED NOT DETECTED Final   A.calcoaceticus-baumannii DETECTED (A) NOT DETECTED Final    Comment: CRITICAL RESULT CALLED TO, READ BACK BY AND VERIFIED WITH: [place]  ON 11/16/22 @ 1444 BY DRT    Bacteroides fragilis NOT DETECTED NOT DETECTED Final   Enterobacterales NOT DETECTED NOT DETECTED Final   Enterobacter cloacae complex NOT DETECTED NOT DETECTED Final   Escherichia coli NOT DETECTED NOT DETECTED Final    Klebsiella aerogenes NOT DETECTED NOT DETECTED Final   Klebsiella oxytoca NOT DETECTED NOT DETECTED Final   Klebsiella pneumoniae NOT DETECTED NOT DETECTED Final   Proteus species NOT DETECTED NOT DETECTED Final   Salmonella species NOT DETECTED NOT DETECTED Final   Serratia marcescens NOT DETECTED NOT DETECTED Final   Haemophilus influenzae NOT DETECTED NOT DETECTED Final   Neisseria meningitidis NOT DETECTED NOT DETECTED Final   Pseudomonas aeruginosa NOT DETECTED NOT DETECTED Final   Stenotrophomonas maltophilia NOT DETECTED NOT DETECTED Final   Candida albicans NOT DETECTED NOT DETECTED Final   Candida auris NOT DETECTED NOT DETECTED Final   Candida glabrata NOT DETECTED NOT DETECTED Final   Candida krusei NOT DETECTED NOT DETECTED Final   Candida parapsilosis NOT DETECTED NOT DETECTED Final   Candida tropicalis NOT DETECTED NOT DETECTED Final   Cryptococcus neoformans/gattii NOT DETECTED NOT DETECTED Final   CTX-M ESBL NOT DETECTED NOT DETECTED Final   Carbapenem resistance IMP NOT DETECTED NOT DETECTED Final   Carbapenem resistance KPC NOT DETECTED NOT DETECTED Final   Carbapenem resistance NDM NOT DETECTED NOT DETECTED Final   Carbapenem resistance VIM NOT DETECTED NOT DETECTED Final    Comment: Performed at [facility], [address], [place], [place] [address]  Blood Culture (routine x 2)     Status: None   Collection Time: 11/15/22 12:28 PM   Specimen: BLOOD LEFT HAND  Result Value Ref Range Status   Specimen Description BLOOD LEFT HAND  Final   Special Requests   Final    BOTTLES DRAWN AEROBIC ONLY Blood Culture results may not be optimal due to an inadequate volume of blood received in culture bottles   Culture   Final    NO GROWTH 5 DAYS Performed at [facility], [address] [place], [place] [address]    Report Status 11/20/2022 FINAL  Final  Resp panel by RT-PCR (RSV, Flu A&B, Covid) Anterior Nasal Swab     Status: None   Collection Time: 11/15/22   1:28 PM   Specimen: Anterior Nasal Swab  Result Value Ref Range Status   SARS Coronavirus 2 by RT PCR NEGATIVE NEGATIVE Final    Comment: (NOTE) SARS-CoV-2 target nucleic acids are NOT DETECTED.  The SARS-CoV-2 RNA is generally detectable in upper respiratory specimens during the acute phase of infection. The lowest concentration of SARS-CoV-2 viral copies this assay can detect is 138 copies/mL. A negative result does not preclude SARS-Cov-2 infection and should not be used as the sole basis for treatment or other patient management decisions. A negative result may occur with  improper specimen collection/handling, submission of specimen other than nasopharyngeal swab, presence of viral mutation(s) within the areas targeted by this assay, and inadequate number of viral copies(<138 copies/mL). A negative result must be combined with clinical observations, patient history, and  epidemiological information. The expected result is Negative.  Fact Sheet for Patients:  EntrepreneurPulse.com.au  Fact Sheet for Healthcare Providers:  IncredibleEmployment.be  This test is no t yet approved or cleared by the [place] FDA and  has been authorized for detection and/or diagnosis of SARS-CoV-2 by FDA under an Emergency Use Authorization (EUA). This EUA will remain  in effect (meaning this test can be used) for the duration of the COVID-19 declaration under Section 564(b)(1) of the Act, 21 U.S.C.section 360bbb-3(b)(1), unless the authorization is terminated  or revoked sooner.       Influenza A by PCR NEGATIVE NEGATIVE Final   Influenza B by PCR NEGATIVE NEGATIVE Final    Comment: (NOTE) The Xpert Xpress SARS-CoV-2/FLU/RSV plus assay is intended as an aid in the diagnosis of influenza from Nasopharyngeal swab specimens and should not be used as a sole basis for treatment. Nasal washings and aspirates are unacceptable for Xpert Xpress  SARS-CoV-2/FLU/RSV testing.  Fact Sheet for Patients: EntrepreneurPulse.com.au  Fact Sheet for Healthcare Providers: IncredibleEmployment.be  This test is not yet approved or cleared by the [place] FDA and has been authorized for detection and/or diagnosis of SARS-CoV-2 by FDA under an Emergency Use Authorization (EUA). This EUA will remain in effect (meaning this test can be used) for the duration of the COVID-19 declaration under Section 564(b)(1) of the Act, 21 U.S.C. section 360bbb-3(b)(1), unless the authorization is terminated or revoked.     Resp Syncytial Virus by PCR NEGATIVE NEGATIVE Final    Comment: (NOTE) Fact Sheet for Patients: EntrepreneurPulse.com.au  Fact Sheet for Healthcare Providers: IncredibleEmployment.be  This test is not yet approved or cleared by the [place] FDA and has been authorized for detection and/or diagnosis of SARS-CoV-2 by FDA under an Emergency Use Authorization (EUA). This EUA will remain in effect (meaning this test can be used) for the duration of the COVID-19 declaration under Section 564(b)(1) of the Act, 21 U.S.C. section 360bbb-3(b)(1), unless the authorization is terminated or revoked.  Performed at [facility], [address], [place], [place] [address]   Culture, blood (Routine X 2) w Reflex to ID Panel     Status: None   Collection Time: 11/17/22  4:52 PM   Specimen: BLOOD RIGHT HAND  Result Value Ref Range Status   Specimen Description BLOOD RIGHT HAND  Final   Special Requests   Final    BOTTLES DRAWN AEROBIC AND ANAEROBIC Blood Culture results may not be optimal due to an inadequate volume of blood received in culture bottles   Culture   Final    NO GROWTH 5 DAYS Performed at [facility], [address], [place], [place] [address]    Report Status 11/22/2022 FINAL  Final  Culture, blood (Routine X 2) w Reflex to ID Panel     Status: None    Collection Time: 11/17/22  6:30 PM   Specimen: BLOOD LEFT HAND  Result Value Ref Range Status   Specimen Description BLOOD LEFT HAND  Final   Special Requests   Final    BOTTLES DRAWN AEROBIC ONLY Blood Culture adequate volume   Culture   Final    NO GROWTH 5 DAYS Performed at [facility], [address], [place], [place] [address]    Report Status 11/22/2022 FINAL  Final  MRSA Next Gen by PCR, Nasal     Status: None   Collection Time: 11/26/22  9:49 AM   Specimen: Nasal Mucosa; Nasal Swab  Result Value Ref Range Status  MRSA by PCR Next Gen NOT DETECTED NOT DETECTED Final    Comment: (NOTE) The GeneXpert MRSA Assay (FDA approved for NASAL specimens only), is one component of a comprehensive MRSA colonization surveillance program. It is not intended to diagnose MRSA infection nor to guide or monitor treatment for MRSA infections. Test performance is not FDA approved in patients less than 49 years old. Performed at [facility], [address], [place], [place] [address]   Remove and replace urinary cath (placed > 5 days) then obtain urine culture from new indwelling urinary catheter.     Status: None   Collection Time: 11/26/22  6:07 PM   Specimen: Urine, Catheterized  Result Value Ref Range Status   Specimen Description   Final    URINE, CATHETERIZED Performed at [facility], [address] [place], [place] [address]    Special Requests   Final    NONE Performed at [facility], [address], [place], [place] [address]    Culture   Final    NO GROWTH Performed at [facility], [place] [address] [place], [place] [address]    Report Status 11/28/2022 FINAL  Final    Labs: CBC: Recent Labs  Lab 11/25/22 0419 11/26/22 0630 11/26/22 1952 11/27/22 0827 11/28/22 0323 11/29/22 1113  WBC 4.5 5.4  --  9.0 7.5 10.6*  NEUTROABS  --   --   --  6.8  --   --   HGB 7.3* 6.8* 8.1* 8.8* 6.7* 8.6*  HCT 23.1* 21.7* 24.8* 27.6* 21.3* 27.4*  MCV 103.1* 103.3*  --  100.7*  102.9* 101.9*  PLT 45* 51*  --  36* 24* 17*   Basic Metabolic Panel: Recent Labs  Lab 11/26/22 0630 11/26/22 1255 11/27/22 0402 11/28/22 0323 11/29/22 1113  NA 129* 129* 133* 138 138  K 6.7* 6.0* 5.9* 4.6 5.6*  CL 101 101 106 111 111  CO2 21* 21* 18* 22 18*  GLUCOSE 95 84 68* 465* 91  BUN 60* 64* 66* 69* 85*  CREATININE 1.76* 1.88* 1.77* 1.68* 1.92*  CALCIUM 7.9* 8.3* 8.3* 7.6* 7.9*   Liver Function Tests: Recent Labs  Lab 11/26/22 0630 11/26/22 1255 11/27/22 0402 11/28/22 0323 11/29/22 1113  AST 120* 131* 132* 98* 131*  ALT 44 52* 50* 42 51*  ALKPHOS 118 122 121 94 111  BILITOT 1.1 1.4* 2.2* 1.8* 2.0*  PROT 4.2* 5.0* 4.8* 4.2* 4.6*  ALBUMIN <1.5* <1.5* <1.5* <1.5* <1.5*   CBG: Recent Labs  Lab 11/28/22 2204 11/29/22 0538 11/29/22 0722 11/29/22 1139 11/29/22 1638  GLUCAP 105* 81 84 83 89    Discharge time spent: greater than 30 minutes.  Signed: Deatra James, MD [facility] 12/01/2022

## 2022-12-01 NOTE — Progress Notes (Signed)
Patient discharged to [facility] called and given to Amy S, RN, transported by [facility] to awaiting facility.  Discharged with foley intact. IV discontinued, catheter intact.

## 2022-12-02 ENCOUNTER — Encounter (HOSPITAL_COMMUNITY): Payer: Medicaid Other | Admitting: Physical Therapy

## 2022-12-03 ENCOUNTER — Inpatient Hospital Stay: Payer: Medicaid Other

## 2022-12-03 DEATH — deceased

## 2022-12-08 ENCOUNTER — Encounter (HOSPITAL_COMMUNITY): Payer: Medicaid Other | Admitting: Physical Therapy

## 2022-12-09 ENCOUNTER — Encounter: Payer: Self-pay | Admitting: Internal Medicine

## 2022-12-09 ENCOUNTER — Ambulatory Visit: Payer: Medicaid Other | Admitting: Internal Medicine

## 2022-12-10 ENCOUNTER — Encounter (HOSPITAL_COMMUNITY): Payer: Medicaid Other

## 2022-12-15 ENCOUNTER — Encounter (HOSPITAL_COMMUNITY): Payer: Medicaid Other

## 2022-12-16 ENCOUNTER — Encounter (HOSPITAL_COMMUNITY): Payer: Medicaid Other | Admitting: Physical Therapy

## 2022-12-31 ENCOUNTER — Other Ambulatory Visit: Payer: Medicaid Other

## 2023-01-03 ENCOUNTER — Encounter: Payer: Self-pay | Admitting: Hematology

## 2023-01-04 ENCOUNTER — Encounter: Payer: Self-pay | Admitting: Hematology

## 2023-01-06 ENCOUNTER — Ambulatory Visit: Payer: Self-pay | Attending: Internal Medicine | Admitting: Internal Medicine

## 2023-01-07 ENCOUNTER — Ambulatory Visit: Payer: Medicaid Other | Admitting: Physician Assistant

## 2023-03-18 ENCOUNTER — Ambulatory Visit (HOSPITAL_COMMUNITY): Admission: RE | Admit: 2023-03-18 | Payer: Self-pay | Source: Ambulatory Visit

## 2024-01-03 ENCOUNTER — Telehealth: Payer: Self-pay | Admitting: *Deleted

## 2024-01-03 NOTE — Telephone Encounter (Signed)
 Left message for patient to call to discuss scheduling lung cancer screening CT under the grant funding.

## 2024-03-23 ENCOUNTER — Encounter: Payer: Self-pay | Admitting: *Deleted

## 2024-07-18 ENCOUNTER — Encounter (INDEPENDENT_AMBULATORY_CARE_PROVIDER_SITE_OTHER): Payer: Self-pay | Admitting: Gastroenterology
